# Patient Record
Sex: Male | Born: 1945 | Race: White | Hispanic: No | Marital: Married | Smoking: Never smoker
Health system: Southern US, Community
[De-identification: ages and names within clinical notes are randomized; demographics above are authoritative.]

## PROBLEM LIST (undated history)

## (undated) DIAGNOSIS — M67919 Unspecified disorder of synovium and tendon, unspecified shoulder: Secondary | ICD-10-CM

## (undated) DIAGNOSIS — I701 Atherosclerosis of renal artery: Secondary | ICD-10-CM

## (undated) DIAGNOSIS — E785 Hyperlipidemia, unspecified: Secondary | ICD-10-CM

## (undated) DIAGNOSIS — G4733 Obstructive sleep apnea (adult) (pediatric): Secondary | ICD-10-CM

## (undated) DIAGNOSIS — I219 Acute myocardial infarction, unspecified: Secondary | ICD-10-CM

## (undated) DIAGNOSIS — J8410 Pulmonary fibrosis, unspecified: Secondary | ICD-10-CM

## (undated) DIAGNOSIS — I2510 Atherosclerotic heart disease of native coronary artery without angina pectoris: Secondary | ICD-10-CM

## (undated) DIAGNOSIS — N200 Calculus of kidney: Secondary | ICD-10-CM

## (undated) DIAGNOSIS — I10 Essential (primary) hypertension: Secondary | ICD-10-CM

## (undated) DIAGNOSIS — C4490 Unspecified malignant neoplasm of skin, unspecified: Secondary | ICD-10-CM

## (undated) DIAGNOSIS — Z9289 Personal history of other medical treatment: Secondary | ICD-10-CM

## (undated) DIAGNOSIS — I872 Venous insufficiency (chronic) (peripheral): Secondary | ICD-10-CM

## (undated) DIAGNOSIS — Q891 Congenital malformations of adrenal gland: Secondary | ICD-10-CM

## (undated) DIAGNOSIS — I509 Heart failure, unspecified: Secondary | ICD-10-CM

## (undated) DIAGNOSIS — E119 Type 2 diabetes mellitus without complications: Secondary | ICD-10-CM

## (undated) DIAGNOSIS — K635 Polyp of colon: Secondary | ICD-10-CM

## (undated) DIAGNOSIS — N183 Chronic kidney disease, stage 3 unspecified: Secondary | ICD-10-CM

## (undated) DIAGNOSIS — M1990 Unspecified osteoarthritis, unspecified site: Secondary | ICD-10-CM

## (undated) DIAGNOSIS — N390 Urinary tract infection, site not specified: Secondary | ICD-10-CM

## (undated) DIAGNOSIS — K219 Gastro-esophageal reflux disease without esophagitis: Secondary | ICD-10-CM

## (undated) DIAGNOSIS — E079 Disorder of thyroid, unspecified: Secondary | ICD-10-CM

## (undated) HISTORY — DX: Venous insufficiency (chronic) (peripheral): I87.2

## (undated) HISTORY — DX: Calculus of kidney: N20.0

## (undated) HISTORY — DX: Chronic kidney disease, stage 3 unspecified: N18.30

## (undated) HISTORY — DX: Atherosclerotic heart disease of native coronary artery without angina pectoris: I25.10

## (undated) HISTORY — DX: Unspecified disorder of synovium and tendon, unspecified shoulder: M67.919

## (undated) HISTORY — DX: Chronic kidney disease, stage 3 (moderate): N18.3

## (undated) HISTORY — PX: CARPAL TUNNEL RELEASE: SHX101

## (undated) HISTORY — DX: Hyperlipidemia, unspecified: E78.5

## (undated) HISTORY — PX: LUNG BIOPSY: SHX232

## (undated) HISTORY — DX: Congenital malformations of adrenal gland: Q89.1

## (undated) HISTORY — DX: Obstructive sleep apnea (adult) (pediatric): G47.33

## (undated) HISTORY — DX: Personal history of other medical treatment: Z92.89

## (undated) HISTORY — DX: Essential (primary) hypertension: I10

## (undated) HISTORY — DX: Polyp of colon: K63.5

## (undated) HISTORY — PX: KNEE SURGERY: SHX244

## (undated) HISTORY — DX: Unspecified malignant neoplasm of skin, unspecified: C44.90

## (undated) HISTORY — PX: OTHER SURGICAL HISTORY: SHX169

## (undated) HISTORY — DX: Pulmonary fibrosis, unspecified: J84.10

## (undated) HISTORY — DX: Gastro-esophageal reflux disease without esophagitis: K21.9

## (undated) HISTORY — DX: Acute myocardial infarction, unspecified: I21.9

## (undated) HISTORY — DX: Type 2 diabetes mellitus without complications: E11.9

## (undated) HISTORY — DX: Urinary tract infection, site not specified: N39.0

## (undated) HISTORY — DX: Disorder of thyroid, unspecified: E07.9

## (undated) HISTORY — DX: Atherosclerosis of renal artery: I70.1

## (undated) HISTORY — DX: Heart failure, unspecified: I50.9

## (undated) HISTORY — PX: SKIN CANCER EXCISION: SHX779

## (undated) HISTORY — DX: Unspecified osteoarthritis, unspecified site: M19.90

---

## 2005-07-03 HISTORY — PX: CORONARY ANGIOPLASTY WITH STENT PLACEMENT: SHX49

## 2008-05-06 ENCOUNTER — Encounter: Payer: Self-pay | Admitting: Cardiovascular Disease

## 2009-03-23 ENCOUNTER — Encounter: Payer: Self-pay | Admitting: Cardiovascular Disease

## 2009-07-23 ENCOUNTER — Encounter: Payer: Self-pay | Admitting: Cardiovascular Disease

## 2009-08-12 ENCOUNTER — Encounter: Payer: Self-pay | Admitting: Cardiovascular Disease

## 2009-08-16 ENCOUNTER — Encounter: Payer: Self-pay | Admitting: Cardiovascular Disease

## 2009-08-30 ENCOUNTER — Encounter: Payer: Self-pay | Admitting: Cardiovascular Disease

## 2009-09-13 ENCOUNTER — Encounter: Payer: Self-pay | Admitting: Cardiovascular Disease

## 2010-01-04 ENCOUNTER — Encounter: Payer: Self-pay | Admitting: Cardiovascular Disease

## 2010-03-22 ENCOUNTER — Encounter: Payer: Self-pay | Admitting: *Deleted

## 2010-04-02 ENCOUNTER — Encounter: Payer: Self-pay | Admitting: *Deleted

## 2010-05-03 ENCOUNTER — Encounter: Payer: Self-pay | Admitting: *Deleted

## 2010-05-20 ENCOUNTER — Encounter: Payer: Self-pay | Admitting: Cardiovascular Disease

## 2010-06-02 ENCOUNTER — Encounter: Payer: Self-pay | Admitting: Cardiovascular Disease

## 2010-08-31 ENCOUNTER — Encounter: Payer: Self-pay | Admitting: Family Medicine

## 2010-08-31 ENCOUNTER — Emergency Department (HOSPITAL_COMMUNITY)
Admission: EM | Admit: 2010-08-31 | Discharge: 2010-08-31 | Disposition: A | Discharge status: Home / Self Care | Payer: Medicare PPO | Attending: Emergency Medicine | Admitting: Emergency Medicine

## 2010-08-31 ENCOUNTER — Encounter: Payer: Self-pay | Admitting: Cardiovascular Disease

## 2010-08-31 ENCOUNTER — Ambulatory Visit: Payer: Medicare Other | Admitting: Family Medicine

## 2010-08-31 DIAGNOSIS — I509 Heart failure, unspecified: Secondary | ICD-10-CM

## 2010-08-31 DIAGNOSIS — Z79899 Other long term (current) drug therapy: Secondary | ICD-10-CM | POA: Insufficient documentation

## 2010-08-31 DIAGNOSIS — R5381 Other malaise: Secondary | ICD-10-CM | POA: Insufficient documentation

## 2010-08-31 DIAGNOSIS — N183 Chronic kidney disease, stage 3 unspecified: Secondary | ICD-10-CM | POA: Insufficient documentation

## 2010-08-31 DIAGNOSIS — I10 Essential (primary) hypertension: Secondary | ICD-10-CM | POA: Insufficient documentation

## 2010-08-31 DIAGNOSIS — Z7982 Long term (current) use of aspirin: Secondary | ICD-10-CM | POA: Insufficient documentation

## 2010-08-31 DIAGNOSIS — N189 Chronic kidney disease, unspecified: Secondary | ICD-10-CM

## 2010-08-31 DIAGNOSIS — E1149 Type 2 diabetes mellitus with other diabetic neurological complication: Secondary | ICD-10-CM

## 2010-08-31 DIAGNOSIS — R51 Headache: Secondary | ICD-10-CM | POA: Insufficient documentation

## 2010-08-31 DIAGNOSIS — G4733 Obstructive sleep apnea (adult) (pediatric): Secondary | ICD-10-CM | POA: Insufficient documentation

## 2010-08-31 DIAGNOSIS — E119 Type 2 diabetes mellitus without complications: Secondary | ICD-10-CM | POA: Insufficient documentation

## 2010-08-31 DIAGNOSIS — I2510 Atherosclerotic heart disease of native coronary artery without angina pectoris: Secondary | ICD-10-CM | POA: Insufficient documentation

## 2010-08-31 DIAGNOSIS — I209 Angina pectoris, unspecified: Secondary | ICD-10-CM

## 2010-08-31 DIAGNOSIS — E8881 Metabolic syndrome: Secondary | ICD-10-CM | POA: Insufficient documentation

## 2010-08-31 DIAGNOSIS — R42 Dizziness and giddiness: Secondary | ICD-10-CM | POA: Insufficient documentation

## 2010-08-31 DIAGNOSIS — E1129 Type 2 diabetes mellitus with other diabetic kidney complication: Secondary | ICD-10-CM | POA: Insufficient documentation

## 2010-08-31 DIAGNOSIS — J8410 Pulmonary fibrosis, unspecified: Secondary | ICD-10-CM | POA: Insufficient documentation

## 2010-08-31 LAB — POCT CARDIAC MARKERS
CKMB, poc: <1 ng/mL — ABNORMAL LOW (ref 1.0–8.0)
Myoglobin, poc: 117 ng/mL (ref 12–200)
Troponin i, poc: <0.05 ng/mL (ref 0.00–0.09)

## 2010-08-31 LAB — POCT I-STAT, CHEM 8
BUN: 19 mg/dL (ref 6–23)
Calcium, Ion: 1.18 mmol/L (ref 1.12–1.32)
Chloride: 106 mEq/L (ref 96–112)
Creatinine, Ser: 1.3 mg/dL (ref 0.4–1.5)
Glucose, Bld: 204 mg/dL — ABNORMAL HIGH (ref 70–99)
HCT: 42 % (ref 39.0–52.0)
Hemoglobin: 14.3 g/dL (ref 13.0–17.0)
Potassium: 3.9 mEq/L (ref 3.5–5.1)
Sodium: 141 mEq/L (ref 135–145)
TCO2: 25 mmol/L (ref 0–100)

## 2010-09-01 ENCOUNTER — Ambulatory Visit (INDEPENDENT_AMBULATORY_CARE_PROVIDER_SITE_OTHER): Payer: Medicare PPO | Admitting: Cardiovascular Disease

## 2010-09-01 ENCOUNTER — Encounter: Payer: Self-pay | Admitting: Family Medicine

## 2010-09-01 ENCOUNTER — Telehealth: Payer: Self-pay | Admitting: Family Medicine

## 2010-09-01 ENCOUNTER — Encounter: Payer: Self-pay | Admitting: Cardiovascular Disease

## 2010-09-01 DIAGNOSIS — I2510 Atherosclerotic heart disease of native coronary artery without angina pectoris: Secondary | ICD-10-CM

## 2010-09-01 DIAGNOSIS — I10 Essential (primary) hypertension: Secondary | ICD-10-CM

## 2010-09-01 DIAGNOSIS — E119 Type 2 diabetes mellitus without complications: Secondary | ICD-10-CM

## 2010-09-01 DIAGNOSIS — R079 Chest pain, unspecified: Secondary | ICD-10-CM

## 2010-09-01 DIAGNOSIS — R0602 Shortness of breath: Secondary | ICD-10-CM

## 2010-09-08 NOTE — Progress Notes (Signed)
  Phone Note Outgoing Call   Call placed by: Irwin Brakeman MD,  September 01, 2010 11:02 AM Call placed to: Patient Reason for Call: Confirm/change Appt, Discuss lab or test results, Get patient information Summary of Call: I called and spoke with patient to check on his condition.  He went to [place] yesterday and sent home when cardiac enzymes were negative and told to see his PCP about his BP.  He said his BP this morning was 221/110.   I told him that I would try to get him an appointment with a cardiologist asap.  I spoke with scheduling with [facility] and they will see him today at 2 pm.  I will fax patient's records over there today and I notified patient of time and location of appointment.  The patient verbalized clear understanding.   I told patient to go to ER with a development of CP, worsening SOB; or any new bad changes.  The patient verbalized clear understanding.   Initial call taken by: Irwin Brakeman MD,  September 01, 2010 11:05 AM

## 2010-09-08 NOTE — Miscellaneous (Signed)
Summary: Update Meds  Clinical Lists Changes  Medications: Added new medication of PLAVIX 75 MG TABS (CLOPIDOGREL BISULFATE) take 1 by mouth daily Added new medication of CARVEDILOL 25 MG TABS (CARVEDILOL) take 1 by mouth two times a day Added new medication of GABAPENTIN 800 MG TABS (GABAPENTIN) take 1 in AM and 1.5 at HS Added new medication of FUROSEMIDE 80 MG TABS (FUROSEMIDE) take 1 every other day Added new medication of TRAMADOL HCL 50 MG TABS (TRAMADOL HCL) take 1 by mouth daily Added new medication of ASPIRIN 325 MG TABS (ASPIRIN) take 1 by mouth daily Added new medication of ISOSORBIDE MONONITRATE CR 60 MG XR24H-TAB (ISOSORBIDE MONONITRATE) take 1 by mouth daily Added new medication of FAMOTIDINE 20 MG TABS (FAMOTIDINE) take 1 by mouth daily Added new medication of AMITRIPTYLINE HCL 25 MG TABS (AMITRIPTYLINE HCL) Take 1 to 3 tabs at bedtime as directed Added new medication of NITROSTAT 0.4 MG SUBL (NITROGLYCERIN) take as directed for chest pain Added new medication of LIPITOR 80 MG TABS (ATORVASTATIN CALCIUM) take 1 by mouth at HS daily

## 2010-09-08 NOTE — Assessment & Plan Note (Signed)
Summary: Elevated BP/AMD   Visit Type:  Initial Consult Primary Provider:  Dr.Duncan  CC:  c/o high BP and HA. Denies chest pain.  History of Present Illness: [name] is a very pleasant 65 year old gentleman with a history of coronary artery disease, PCI to the mid RCA and mid left circumflex in October 2007, interstitial fibrosis, with wedge resection, hypertension, hyperlipidemia, remote tobacco abuse, obesity also with substernal chest pain who presents to establish care.  He reports that for the past week, his blood pressure has been very elevated. It had been normal on his previous medication regiment and he is uncertain why his numbers are elevated now. He denies any worsening lower extremity edema, shortness of breath is approximately the same. No new stressors. He has not run out of any of his medicines.  He did go to urgent care and was sent to the emergency room. Urgentcare gave him clonidine 0.1 mg x1 and this improved his pressure though he is now without any additional medication.  Cardiac catheterization in July 2008 shows 10% proximal LAD disease, otherwise no significant stenoses. Stent to the mid RCA placed August 2007 was patent, stent with DES to the mid left circumflex in October 2007 also patent.  Open lung biopsy was performed November 2009 for bilateral pulmonary infiltrates.  Stress test July 2011 was dobutamine stress that showed no significant ischemia, inadequate heart rate was achieved.  right and left heart catheterization January 2011 showed 50% mid LAD disease, 60% at the ostium of the diagonal #2, 30% proximal left circumflex disease, patent stent in the left circumflex, patent stent of the mid RCA, wedge pressure of 16, PA pressure mean 22, right ventricular pressure 41/8 .  echocardiogram September 2010 shows normal systolic function, diastolic dysfunction, normal RV function unable to evaluate right ventricular systolic pressures  EKG shows normal sinus  rhythm with rate 75 beats per minute with poor R-wave progression through the precordial leads, left axis deviation    Current Medications (verified): 1)  Novolog 100 Unit/ml Soln (Insulin Aspart) .... Take 25 Units Tidac or As Directed By Physician 2)  Lisinopril 20 Mg Tabs (Lisinopril) .... Take 1 By Mouth Daily 3)  Plavix 75 Mg Tabs (Clopidogrel Bisulfate) .... Take 1 By Mouth Daily 4)  Carvedilol 25 Mg Tabs (Carvedilol) .... Take 1 By Mouth Two Times A Day 5)  Gabapentin 800 Mg Tabs (Gabapentin) .... Take 1 in Am and 1.5 At [facility] 6)  Furosemide 80 Mg Tabs (Furosemide) .... Take 1 Every Other Day 7)  Tramadol Hcl 50 Mg Tabs (Tramadol Hcl) .... Take 1 By Mouth Daily 8)  Aspirin 325 Mg Tabs (Aspirin) .... Take 1 By Mouth Daily 9)  Isosorbide Mononitrate Cr 60 Mg Xr24h-Tab (Isosorbide Mononitrate) .... Take 1 By Mouth Daily 10)  Famotidine 20 Mg Tabs (Famotidine) .... Take 1 By Mouth Daily 11)  Amitriptyline Hcl 25 Mg Tabs (Amitriptyline Hcl) .... Take 1 To 3 Tabs At Bedtime As Directed 12)  Nitrostat 0.4 Mg Subl (Nitroglycerin) .... Take As Directed For Chest Pain 13)  Lipitor 80 Mg Tabs (Atorvastatin Calcium) .... Take 1 By Mouth At [facility] Daily  Allergies (verified): No Known Drug Allergies  Past History:  Past Medical History: Last updated: 08/31/2010 CAD s/p MI x 3  now with 2 stents DM type 2, insulin requiring Diabetic Neuropathy Sleep Apnea - Obstructive HTN CHF Kidney Stones Dyslipidemia Metabolic Syndrome  Past Surgical History: Last updated: 08/31/2010 Adrenal Adenoma Removal Cardiac Stents x 2  in 2007  Knee Surgery Lung Biopsy  Family History: Last updated: 08/31/2010 Heart Disease - Strong  Social History: Last updated: 08/31/2010 Married, No ETOH, No Tobacco (current), No recreational drugs.  Review of Systems       The patient complains of dyspnea on exertion and peripheral edema.  The patient denies fever, weight loss, weight gain, vision loss, decreased  hearing, hoarseness, chest pain, syncope, prolonged cough, abdominal pain, incontinence, muscle weakness, depression, and enlarged lymph nodes.    Vital Signs:  Patient profile:   65 year old male Height:      65 inches Weight:      284.25 pounds BMI:     47.47 Pulse rate:   75 / minute BP sitting:   168 / 82  (left arm) Cuff size:   large  Vitals Entered By: Rodman Comp CMA (September 01, 2010 2:13 PM)  Physical Exam  General:  obese gentleman in no apparent distress with nasal cannula oxygen Head:  normocephalic and atraumatic Neck:  Neck supple, no JVD. No masses, thyromegaly or abnormal cervical nodes. Lungs:  Clear bilaterally to auscultation and percussion. Heart:  Non-displaced PMI, chest non-tender; regular rate and rhythm, S1, S2 without murmurs, rubs or gallops. Carotid upstroke normal, no bruit. Pedals normal pulses. trace to 1+ edema bilaterally lower extremities , no varicosities. Abdomen:  Bowel sounds positive; abdomen soft and non-tender without masses Msk:  Back normal, normal gait. Muscle strength and tone normal. Pulses:  pulses normal in all 4 extremities Extremities:  No clubbing or cyanosis. Neurologic:  Alert and oriented x 3. Skin:  Intact without lesions or rashes. Psych:  Normal affect.   Impression & Recommendations:  Problem # 1:  HYPERTENSION, UNCONTROLLED (ICD-401.9) blood pressure continues to be elevated. We will continue him on clonidine 0.1 mg b.i.d. and titrate this upwards as needed over the next several weeks for goal systolic pressure less than 140. We'll continue his other current medications as he is tolerating them well.  His updated medication list for this problem includes:    Lisinopril 20 Mg Tabs (Lisinopril) .Marland Kitchen... Take 1 by mouth daily    Carvedilol 25 Mg Tabs (Carvedilol) .Marland Kitchen... Take 1 by mouth two times a day    Furosemide 80 Mg Tabs (Furosemide) .Marland Kitchen... Take 1 every other day    Aspir-low 81 Mg Tbec (Aspirin) .Marland Kitchen... 1 tablet two times  a day    Clonidine Hcl 0.1 Mg Tabs (Clonidine hcl) .Marland Kitchen... Take one tablet by mouth twice a day  Problem # 2:  RENAL INSUFFICIENCY, CHRONIC (ICD-585.9) Recent creatinine 1.3. He reports having a low GFR in the past  Problem # 3:  CAD, NATIVE VESSEL (ICD-414.01) history of stenting to his RCA and left circumflex in 2007. No further intervention needed since that time with recent cardiac catheterization last year showing patent stents. We have suggested he stay on his current medication regimen.  His updated medication list for this problem includes:    Lisinopril 20 Mg Tabs (Lisinopril) .Marland Kitchen... Take 1 by mouth daily    Plavix 75 Mg Tabs (Clopidogrel bisulfate) .Marland Kitchen... Take 1 by mouth daily    Carvedilol 25 Mg Tabs (Carvedilol) .Marland Kitchen... Take 1 by mouth two times a day    Aspir-low 81 Mg Tbec (Aspirin) .Marland Kitchen... 1 tablet two times a day    Isosorbide Mononitrate Cr 60 Mg Xr24h-tab (Isosorbide mononitrate) .Marland Kitchen... Take 1 by mouth daily    Nitrostat 0.4 Mg Subl (Nitroglycerin) .Marland Kitchen... Take as directed for chest pain  Problem #  4:  CONGESTIVE HEART FAILURE (ICD-428.00) No signs of significant CHF. His edema is likely secondary to venous insufficiency. His right heart catheterization last year showed relatively benign right heart pressures. He takes Lasix every other day and p.r.n. for worsening edema. He likely has mild pulmonary hypertension given his underlying lung disease.  His updated medication list for this problem includes:    Lisinopril 20 Mg Tabs (Lisinopril) .Marland Kitchen... Take 1 by mouth daily    Plavix 75 Mg Tabs (Clopidogrel bisulfate) .Marland Kitchen... Take 1 by mouth daily    Carvedilol 25 Mg Tabs (Carvedilol) .Marland Kitchen... Take 1 by mouth two times a day    Furosemide 80 Mg Tabs (Furosemide) .Marland Kitchen... Take 1 every other day    Aspir-low 81 Mg Tbec (Aspirin) .Marland Kitchen... 1 tablet two times a day    Isosorbide Mononitrate Cr 60 Mg Xr24h-tab (Isosorbide mononitrate) .Marland Kitchen... Take 1 by mouth daily    Nitrostat 0.4 Mg Subl  (Nitroglycerin) .Marland Kitchen... 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 Given his relatively benign cardiac catheterization last year, no further workup has been ordered at this time.  His updated medication list for this problem includes:    Lisinopril 20 Mg Tabs (Lisinopril) .Marland Kitchen... Take 1 by mouth daily    Plavix 75 Mg Tabs (Clopidogrel bisulfate) .Marland Kitchen... Take 1 by mouth daily    Carvedilol 25 Mg Tabs (Carvedilol) .Marland Kitchen... Take 1 by mouth two times a day    Aspir-low 81 Mg Tbec (Aspirin) .Marland Kitchen... 1 tablet two times a day    Isosorbide Mononitrate Cr 60 Mg Xr24h-tab (Isosorbide mononitrate) .Marland Kitchen... Take 1 by mouth daily    Nitrostat 0.4 Mg Subl (Nitroglycerin) .Marland Kitchen... Take as directed for chest pain  Other Orders: EKG w/ Interpretation (93000)  Patient Instructions: 1)  Your physician recommends that you schedule a follow-up appointment in: 2 months, appt made on May 1,2012 at 10:15 2)  Your physician has recommended you make the following change in your medication: STOP taking Aspirin 325mg  and START taking Aspirin 81mg  1 tablet two times a day. START taking Clonidine 0.1 1 tablet two times a day.  Prescriptions: CLONIDINE HCL 0.1 MG TABS (CLONIDINE HCL) Take one tablet by mouth twice a day  #60 x 6   Entered by:   Rodman Comp CMA   Authorized by:   Esmond Plants MD   Signed by:   Rodman Comp CMA on 09/01/2010   Method used:   Electronically to        [facility].* (retail)       [address]       [place], [place]  [address]       Ph: [phone]       Fax: [phone]   RxID:   [id number]   Appended Document: Elevated BP/AMD Correction: Most recent cardiac catheterization did show 50% mid LAD disease. He did have a inconclusive stress test in 2011 in July if he has worsening chest pain, we could repeat the cardiac catheterization or order a lexiscan  Myoview  Appended Document: Elevated BP/AMD he does have underlying diabetes. Notes indicate it is poorly controlled with hemoglobin A1c of 8.8. He is scheduled to followup with Dr. [name] in several weeks' time.

## 2010-09-08 NOTE — Assessment & Plan Note (Signed)
Summary: BP IS HIGH   Vital Signs:  Patient Profile:   65 Years Old Male CC:      Elevated Blood Pressure Weight:      284 pounds O2 Sat:      97 % O2 treatment:    Oxygen 2L/min Temp:     98.6 degrees F oral Pulse rate:   68 / minute Pulse rhythm:   regular Resp:     16 per minute BP sitting:   203 / 88  (right arm)             Is Patient Diabetic? Yes       Current Allergies (reviewed today): No known allergies History of Present Illness History from: patient/Spouse Chief Complaint: Elevated Blood Pressure History of Present Illness: The patient presented today because he is between physicians at the time. Unfortunately his current [facility] plan did not support the PCP that he had been seeing.  He is reporting that for the past 2 weeks he has noticed a large spike in his home blood pressure readings.  He noticed bp readings with high systolic values. He is having SBP readings 170-220s consistently.  He is taking all of his blood pressure medications, He is taking his insulin regularly and taking his  BP readings at home and they have not been able to get the BP down in the last 2 weeks.  He is having chronic anginal chest pains but he says that they have improved greatly since his cardiologist put him on isosorbide regularly.  In addition, he doesn't have a local cardiologist.  He has been seen at [facility] and reports that he needs a local [place] cardiologist.  In addition, he is reporting increasing fatigue over the last several weeks.  He denies increasing weight or fluid edema.  He has CAD, s/p 2 stents and 3 prior MIs.  He has reported history of CHF.  He has Type 2 DM, insulin requiring and O2 requiring COPD.  He had decided not to go to the ER because he was hoping that his pressure would eventually come back down but given his wife's concern they called [facility] and they were referred out here to be seen.   I immediately explained clearly to them that he absolutely needed to go to the  ER to stabilize his BP and assess his delicate cardiac status.  He says that he has used his nighttime CPAP continuously.  They verbalized understanding. They absolutely refused ambulance transport or EMS saying they would go to [place] by private vehicle.    REVIEW OF SYSTEMS Constitutional Symptoms       Complains of fatigue.     Denies fever, chills, night sweats, weight loss, and weight gain.  Eyes       Complains of glasses.      Denies change in vision, eye pain, eye discharge, contact lenses, and eye surgery. Ear/Nose/Throat/Mouth       Denies hearing loss/aids, change in hearing, ear pain, ear discharge, dizziness, frequent runny nose, frequent nose bleeds, sinus problems, sore throat, hoarseness, and tooth pain or bleeding.  Respiratory       Complains of shortness of breath.      Denies dry cough, productive cough, wheezing, asthma, bronchitis, and emphysema/COPD.  Cardiovascular       Complains of chest pain and tires easily with exhertion.      Denies murmurs.    Gastrointestinal       Denies stomach pain, nausea/vomiting, diarrhea, constipation,  blood in bowel movements, and indigestion. Genitourniary       Complains of kidney stones.      Denies painful urination and loss of urinary control. Neurological       Complains of numbness and tingling.      Denies paralysis, seizures, and fainting/blackouts. Musculoskeletal       Complains of swelling.      Denies muscle pain, joint pain, joint stiffness, decreased range of motion, redness, muscle weakness, and gout.  Skin       Denies bruising, unusual mles/lumps or sores, and hair/skin or nail changes.  Psych       Denies mood changes, temper/anger issues, anxiety/stress, speech problems, depression, and sleep problems.  Past History:  Family History: Last updated: 08/31/2010 Heart Disease - Strong  Social History: Last updated: 08/31/2010 Married, No ETOH, No Tobacco (current), No recreational drugs.  Past Medical  History: CAD s/p MI x 3  now with 2 stents DM type 2, insulin requiring Diabetic Neuropathy Sleep Apnea - Obstructive HTN CHF Kidney Stones Dyslipidemia Metabolic Syndrome  Past Surgical History: Adrenal Adenoma Removal Cardiac Stents x 2  in 2007 Knee Surgery Lung Biopsy  Family History: Heart Disease - Strong  Social History: Married, No ETOH, No Tobacco (current), No recreational drugs. Physical Exam General appearance: well developed, well nourished, no acute distress, but chronically ill appearing; carrying O2 tank with nasal cannula Head: normocephalic, atraumatic Eyes: conjunctivae and lids normal Pupils: equal, round, reactive to light Ears: normal, no lesions or deformities Nasal: nasal cannula present; mucosa pink, and dry Oral/Pharynx: pharyngeal erythema without exudate, uvula midline without deviation Neck: neck large, thick,  trachea midline, no masses Thyroid: no nodules, masses, tenderness, or enlargement Chest/Lungs: no rales, wheezes, or rhonchi, bilateral, breath sounds shallow Heart: distant HS, normal s1, s2 sounds;  Abdomen: soft, non-tender without obvious organomegaly Extremities: 2+ pitting edema bilateral lower extremities feet to proximal thigh Neurological: grossly intact and non-focal Skin: no obvious rashes or lesions MSE: oriented to time, place, and person Assessment New Problems: METABOLIC SYNDROME X ([id number]) SLEEP APNEA, OBSTRUCTIVE (ICD-327.23) HYPERTENSION, UNCONTROLLED (ICD-401.9) DIABETES MELLITUS, TYPE II, CONTROLLED, W/NEURO COMPS (ICD-250.60) RENAL INSUFFICIENCY, CHRONIC (ICD-585.9) HYPERTENSION (ICD-401.1) 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  The patient verbalized clear understanding.  The patient was given instructions to return  if symptoms don't improve, worsen or new changes develop.  If it is not during clinic hours and the patient cannot get back to this clinic then the patient was told to seek medical care at an available urgent care or emergency department.  The patient verbalized understanding.   Demonstrates willingness to comply.  Plan New Medications/Changes: LISINOPRIL 20 MG TABS (LISINOPRIL) take 1 by mouth daily  #30 x 1, 08/31/2010, Clanford Johnson MD NOVOLOG 100 UNIT/ML SOLN (INSULIN ASPART) take 25 units TIDAC or as directed by physician  #2 x 2, 08/31/2010, Irwin Brakeman MD  Planning Comments:   Go to ER for evaluation and treatment. The patient verbalized clear understanding.   The patient and wife declined to send him to the ER by EMS transport. They elected to go by private vehicle.  The risks were explained to them and they verbalized understanding.      The patient and/or caregiver has been counseled thoroughly with regard to medications prescribed including dosage, schedule, interactions, rationale for use, and possible side effects  and they verbalize understanding.  Diagnoses and expected course of recovery discussed and will return if not improved as expected or if the condition worsens. Patient and/or caregiver verbalized understanding.  Prescriptions: LISINOPRIL 20 MG TABS (LISINOPRIL) take 1 by mouth daily  #30 x 1   Entered and Authorized by:   Irwin Brakeman MD   Signed by:   Irwin Brakeman MD on 08/31/2010   Method used:   Electronically to        [facility].* (retail)       [address]       [place], [place]  [address]       Ph: [phone]       Fax: [phone]   RxID:   [id number] NOVOLOG 100 UNIT/ML SOLN (INSULIN ASPART) take 25 units TIDAC or as directed by physician  #2 x 2   Entered and Authorized by:   Irwin Brakeman MD   Signed by:   Irwin Brakeman MD on 08/31/2010   Method used:   Electronically to        [facility].* (retail)       [address]       [place], [place]  [address]       Ph: [phone]       Fax: [phone]   RxID:   [id number]   Patient Instructions: 1)  Go directly to the ER for evaluation and treatment.   2)  I have called over and given them some of your information to tell them that you are coming.    Medication Administration  Medication # 1:    Medication: Clonidine 0.1mg  tab    Dose: 1 tablet    Route: po    Patient tolerated medication without complications    The patient was sent directly to the ER.  I called the ER at [facility] where patient said he was going and updated the charge RN about the patient's arrival.  He was stable when leaving our clinic but risks explained to patient and wife who verbalized understanding and decided to proceed.  Gerlene Fee, MD, CDE, [facility]

## 2010-09-14 ENCOUNTER — Encounter: Payer: Self-pay | Admitting: Cardiovascular Disease

## 2010-09-15 ENCOUNTER — Ambulatory Visit (INDEPENDENT_AMBULATORY_CARE_PROVIDER_SITE_OTHER): Payer: Medicare PPO | Admitting: Family Medicine

## 2010-09-15 ENCOUNTER — Other Ambulatory Visit: Payer: Self-pay | Admitting: Family Medicine

## 2010-09-15 ENCOUNTER — Encounter: Payer: Self-pay | Admitting: Family Medicine

## 2010-09-15 DIAGNOSIS — E1149 Type 2 diabetes mellitus with other diabetic neurological complication: Secondary | ICD-10-CM

## 2010-09-15 DIAGNOSIS — I2510 Atherosclerotic heart disease of native coronary artery without angina pectoris: Secondary | ICD-10-CM

## 2010-09-15 DIAGNOSIS — I10 Essential (primary) hypertension: Secondary | ICD-10-CM

## 2010-09-15 DIAGNOSIS — J8410 Pulmonary fibrosis, unspecified: Secondary | ICD-10-CM | POA: Insufficient documentation

## 2010-09-15 LAB — HM DIABETES FOOT EXAM

## 2010-09-19 LAB — HEMOGLOBIN A1C: Hgb A1c MFr Bld: 10.2 % — ABNORMAL HIGH (ref 4.6–6.5)

## 2010-09-20 NOTE — Assessment & Plan Note (Signed)
Summary: NEW MEDICARE PT TO EST/CHECK BP/CLE  [facility]   Vital Signs:  Patient profile:   65 year old male Height:      65 inches Weight:      288.25 pounds BMI:     48.14 Temp:     98.7 degrees F oral Pulse rate:   84 / minute Pulse rhythm:   regular BP sitting:   166 / 90  (left arm) Cuff size:   large  Vitals Entered By: Christena Deem CMA Deborra Medina) (September 15, 2010 2:01 PM)  CC: New Patient.  Check BP (Medicare)   History of Present Illness: H/o pulmonary fibrosis, needs follow up with pulm.  Prev seen at [facility].  On O2.  S/p resection.  Known OSA.  SOB at baseline now, worse with exertion.  Worse in the heat of summer.  Has had symptoms since  ~2008.  Diabetes:  Using medications without difficulties:yes Hypoglycemic episodes:rare Hyperglycemic episodes:occ Feet problems:see exam, controlled with amitriptyline Blood Sugars averaging: AM 100-120.  before lunch sugar is  ~170, similar with dinner.  Due for labs.   CAD per cards.  Last OV note reviewed.  CP improved on nitrates.   Hypertension:      Using medication without problems or lightheadedness: yes Chest pain with exertion:yes, improved on nitrates Edema:controlled Short of breath:at baseline Average home BPs: still elevated on clonidine.   Allergies: No Known Drug Allergies  Past History:  Past Medical History: CAD s/p MI x 3  now with 2 stents DM type 2, insulin requiring Diabetic Neuropathy Sleep Apnea - Obstructive HTN CHF Kidney Stones Dyslipidemia Metabolic Syndrome Arthritis GERD Heart disease High cholesterol Kidney Disease Polyps in the colon Thyroid problem UTI's Pulmonary fibrosis s/p wedge resection, chronic O2 use.   Cardiac- [place]  Past Surgical History: Adrenal Adenoma Removal, right,  ~1990 Cardiac Stents x 2  in 2007 Knee Surgery, right Lung Biopsy B carpal tunnel surgery  Family History: Reviewed history from 08/31/2010 and no changes required. Heart Disease -  Strong Family History of Arthritis, grandparents Family History Breast cancer 1st degree relative <50, other blood relative Family History Diabetes 1st degree relative, other blood relative Family History High cholesterol, grandparents Family History Hypertension, grandparents Family History of Stroke F 1st degree relative <60 grandparents Family History of Sudden Death, parents, other blood relative F dead of MI at 72 M dead of MI at 66 brother dead at 66 of MI  Social History: Reviewed history from 08/31/2010 and no changes required. Married 1970 No ETOH Never smoked No recreational drugs enjoys fishing.  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  Otherwise negative.    Physical Exam  General:  no apparent distress on O2 via [place] normocephalic atraumatic mucous membranes moist neck supple regular rate and rhythm clear to auscultation bilaterally w/o wheeze, but limited by habitus, no increase in wob abdomen soft, obese, normal bs minimal edema in legs  Diabetes Management Exam:    Foot Exam (with socks and/or shoes not present):       Sensory-Pinprick/Light touch:          Left medial foot (L-4): normal          Left dorsal foot (L-5): normal          Left lateral foot (S-1): normal          Right medial foot (L-4): normal  Right dorsal foot (L-5): normal          Right lateral foot (S-1): normal       Sensory-Monofilament:          Left foot: normal          Right foot: normal       Inspection:          Left foot: normal          Right foot: normal       Nails:          Left foot: normal          Right foot: normal   Impression & Recommendations:  Problem # 1:  PULMONARY FIBROSIS (ICD-515) refer to pulm.  Orders: Pulmonary Referral (Pulmonary)  Problem # 2:  DIABETES MELLITUS, TYPE II, CONTROLLED, W/NEURO COMPS (ICD-250.60) >45 min spent with patient, at least half of which was spent  on counseling [id number].  No decrease in sensation today, but he does have neuropathy with pain controlled on TCA.  check A1c and he'll notify me with his sugar readings over the next week to try to adjust his meds.  No change in the meantime.  He agrees with plan.  d/w patient re:Dm2 diet.  His updated medication list for this problem includes:    Novolog 100 Unit/ml Soln (Insulin aspart) .Marland Kitchen... Take 48 units tidac or as directed by physician    Lisinopril 20 Mg Tabs (Lisinopril) .Marland Kitchen... Take 1 by mouth daily    Aspir-low 81 Mg Tbec (Aspirin) .Marland Kitchen... 1 tablet two times a day    Lantus 100 Unit/ml Soln (Insulin glargine) .Marland Kitchen... 90 units every am  Orders: Prescription Created Electronically [id number]) TLB-A1C / Hgb A1C (Glycohemoglobin) (83036-A1C)  Problem # 3:  HYPERTENSION, UNCONTROLLED (ICD-401.9) Inc to 0.2mg  of clonidine two times a day and he knows to take it as scheduled to avoid rebound htn.  His updated medication list for this problem includes:    Lisinopril 20 Mg Tabs (Lisinopril) .Marland Kitchen... Take 1 by mouth daily    Carvedilol 25 Mg Tabs (Carvedilol) .Marland Kitchen... Take 1 by mouth two times a day    Furosemide 80 Mg Tabs (Furosemide) .Marland Kitchen... Take 1 every other day    Clonidine Hcl 0.2 Mg Tabs (Clonidine hcl) .Marland Kitchen... 1 by mouth two times a day  Problem # 4:  CAD, NATIVE VESSEL (ICD-414.01) CP improved,  will work on DM/HTN in meantime.  App cards help.  His updated medication list for this problem includes:    Lisinopril 20 Mg Tabs (Lisinopril) .Marland Kitchen... Take 1 by mouth daily    Plavix 75 Mg Tabs (Clopidogrel bisulfate) .Marland Kitchen... Take 1 by mouth daily    Carvedilol 25 Mg Tabs (Carvedilol) .Marland Kitchen... Take 1 by mouth two times a day    Furosemide 80 Mg Tabs (Furosemide) .Marland Kitchen... Take 1 every other day    Aspir-low 81 Mg Tbec (Aspirin) .Marland Kitchen... 1 tablet two times a day    Isosorbide Mononitrate Cr 60 Mg Xr24h-tab (Isosorbide mononitrate) .Marland Kitchen... Take 1 by mouth daily    Nitrostat 0.4 Mg Subl (Nitroglycerin) .Marland Kitchen... Take as directed  for chest pain    Clonidine Hcl 0.2 Mg Tabs (Clonidine hcl) .Marland Kitchen... 1 by mouth two times a day  Complete Medication List: 1)  Novolog 100 Unit/ml Soln (Insulin aspart) .... Take 48 units tidac or as directed by physician 2)  Lisinopril 20 Mg Tabs (Lisinopril) .... Take 1 by mouth daily 3)  Plavix 75 Mg Tabs (Clopidogrel bisulfate) .Marland KitchenMarland KitchenMarland Kitchen  Take 1 by mouth daily 4)  Carvedilol 25 Mg Tabs (Carvedilol) .... Take 1 by mouth two times a day 5)  Gabapentin 800 Mg Tabs (Gabapentin) .... Take 1 in am and 1.5 at hs 6)  Furosemide 80 Mg Tabs (Furosemide) .... Take 1 every other day 7)  Tramadol Hcl 50 Mg Tabs (Tramadol hcl) .... Take 1 by mouth daily 8)  Aspir-low 81 Mg Tbec (Aspirin) .Marland Kitchen.. 1 tablet two times a day 9)  Isosorbide Mononitrate Cr 60 Mg Xr24h-tab (Isosorbide mononitrate) .... Take 1 by mouth daily 10)  Famotidine 20 Mg Tabs (Famotidine) .... Take 1 by mouth daily 11)  Amitriptyline Hcl 50 Mg Tabs (Amitriptyline hcl) .Marland Kitchen.. 1 by mouth at bedtime 12)  Nitrostat 0.4 Mg Subl (Nitroglycerin) .... Take as directed for chest pain 13)  Lipitor 80 Mg Tabs (Atorvastatin calcium) .... Take 1 by mouth at hs daily 14)  Clonidine Hcl 0.2 Mg Tabs (Clonidine hcl) .Marland Kitchen.. 1 by mouth two times a day 15)  Lantus 100 Unit/ml Soln (Insulin glargine) .... 90 units every am  Patient Instructions: 1)  See Rosaria Ferries about your referral before your leave today.   2)  You can get your results through our phone system.  Follow the instructions on the blue card. 3)  Inc the clonidine to 0.2mg  two times a day and check your pressure at home.  Let me know about the readings next week. 4)  Check your sugar three times a day (before each meal) and let me know about the readings in 1 week.  I'll send information about your insulin at that point.  Take care.  5)  50min OV in 3 months with fasting cmet/lipid/A1c before OV. 250.00  Prescriptions: AMITRIPTYLINE HCL 50 MG TABS (AMITRIPTYLINE HCL) 1 by mouth at bedtime  #90 x 3   Entered  and Authorized by:   Elsie Stain MD   Signed by:   Elsie Stain MD on 09/15/2010   Method used:   Electronically to        [facility] (retail)       [address]       [place], [place]  [address]       Ph: [phone]       Fax: [phone]   RxID:   [id number] CLONIDINE HCL 0.2 MG TABS (CLONIDINE HCL) 1 by mouth two times a day  #180 x 3   Entered and Authorized by:   Elsie Stain MD   Signed by:   Elsie Stain MD on 09/15/2010   Method used:   Electronically to        [facility] (retail)       [address]       [place], [place]  [address]       Ph: [phone]       Fax: [phone]   RxID:   [id number] LANTUS 100 UNIT/ML SOLN (INSULIN GLARGINE) 90 units every AM  #10 vials x 3   Entered and Authorized by:   Elsie Stain MD   Signed by:   Elsie Stain MD on 09/15/2010   Method used:   Electronically to        [facility] (retail)       [place], [place]  [id number]       Ph: [phone]       Fax: [phone]   RxID:   [id number]    Orders Added: 1)  New Patient Level III [id number] 2)  Pulmonary Referral [Pulmonary] 3)  Prescription Created Electronically [id number] 4)  TLB-A1C / Hgb A1C (Glycohemoglobin) [83036-A1C]

## 2010-09-20 NOTE — Op Note (Signed)
Summary: [facility]   Imported By: Mingo Amber Bridgeforth 09/15/2010 11:38:17  _____________________________________________________________________  External Attachment:    Type:   Image     Comment:   External Document

## 2010-09-20 NOTE — Letter (Signed)
Summary: Medical Record Release  Medical Record Release   Imported By: Zenovia Jarred 09/14/2010 08:02:50  _____________________________________________________________________  External Attachment:    Type:   Image     Comment:   External Document

## 2010-09-20 NOTE — Cardiovascular Report (Signed)
Summary: [facility]   Imported By: Sallee Provencal 09/15/2010 11:38:58  _____________________________________________________________________  External Attachment:    Type:   Image     Comment:   External Document

## 2010-09-20 NOTE — Letter (Signed)
Summary: [facility] Visit Note   [facility] Visit Note   Imported By: Sallee Provencal 09/13/2010 11:49:55  _____________________________________________________________________  External Attachment:    Type:   Image     Comment:   External Document

## 2010-09-20 NOTE — Op Note (Signed)
Summary: [facility]   Imported By: Sallee Provencal 09/13/2010 12:33:28  _____________________________________________________________________  External Attachment:    Type:   Image     Comment:   External Document

## 2010-09-20 NOTE — Progress Notes (Signed)
Summary: Med List/At Home Vitals Log   Med List/At Home Vitals Log   Imported By: Sallee Provencal 09/15/2010 12:02:11  _____________________________________________________________________  External Attachment:    Type:   Image     Comment:   External Document

## 2010-09-27 ENCOUNTER — Other Ambulatory Visit: Payer: Self-pay | Admitting: Family Medicine

## 2010-09-27 MED ORDER — INSULIN GLARGINE 100 UNIT/ML ~~LOC~~ SOLN: 95.0000 [IU] | SUBCUTANEOUS | Status: DC

## 2010-09-27 MED ORDER — INSULIN ASPART 100 UNIT/ML ~~LOC~~ SOLN: 50.0000 [IU] | Freq: Three times a day (TID) | SUBCUTANEOUS | Status: DC

## 2010-09-30 ENCOUNTER — Telehealth: Payer: Self-pay | Admitting: *Deleted

## 2010-09-30 ENCOUNTER — Encounter: Payer: Self-pay | Admitting: *Deleted

## 2010-09-30 NOTE — Telephone Encounter (Signed)
Message copied by Christena Deem on Fri Sep 30, 2010  6:17 PM ------      Message from: Renford Dills      Created: Wed Sep 28, 2010  1:02 PM       Please notify Pt.  I sent a not to Dr. Rockey Situ.  We want him to check his BP for another week and then send me the numbers.  I wouldn't change his meds now.  Thanks.

## 2010-09-30 NOTE — Telephone Encounter (Signed)
Message copied by Christena Deem on Fri Sep 30, 2010  9:32 AM ------      Message from: Renford Dills      Created: Wed Sep 28, 2010  1:02 PM       Please notify Pt.  I sent a not to Dr. Rockey Situ.  We want him to check his BP for another week and then send me the numbers.  I wouldn't change his meds now.  Thanks.

## 2010-09-30 NOTE — Telephone Encounter (Signed)
Patient advised of instructions.

## 2010-10-11 ENCOUNTER — Other Ambulatory Visit: Payer: Self-pay | Admitting: *Deleted

## 2010-10-11 MED ORDER — INSULIN ASPART 100 UNIT/ML ~~LOC~~ SOLN: 50.0000 [IU] | Freq: Three times a day (TID) | SUBCUTANEOUS | Status: DC

## 2010-10-12 ENCOUNTER — Encounter: Payer: Self-pay | Admitting: Family Medicine

## 2010-10-20 ENCOUNTER — Encounter: Payer: Self-pay | Admitting: Emergency Medicine

## 2010-10-20 ENCOUNTER — Telehealth: Payer: Self-pay | Admitting: Family Medicine

## 2010-10-20 DIAGNOSIS — I10 Essential (primary) hypertension: Secondary | ICD-10-CM

## 2010-10-20 NOTE — Telephone Encounter (Signed)
Patient advised.  OV scheduled 10/28/2010 at 9:30 a.m.

## 2010-10-20 NOTE — Telephone Encounter (Signed)
Please call pt.  I wouldn't change insulin at this point.  I would continue on current dose and work on DM2 diet.  BP is still up.  I would inc the lisinopril to 40mg  a day (2 of the 20mg  tabs a day) and get OV in ~1 week for BP check and bmet at the [facility].  Keep checking BP in meantime. Thanks.

## 2010-10-21 ENCOUNTER — Encounter: Payer: Self-pay | Admitting: Family Medicine

## 2010-10-24 ENCOUNTER — Encounter: Payer: Self-pay | Admitting: Emergency Medicine

## 2010-10-24 ENCOUNTER — Ambulatory Visit (INDEPENDENT_AMBULATORY_CARE_PROVIDER_SITE_OTHER): Payer: Medicare PPO | Admitting: Emergency Medicine

## 2010-10-24 DIAGNOSIS — G4733 Obstructive sleep apnea (adult) (pediatric): Secondary | ICD-10-CM

## 2010-10-24 DIAGNOSIS — J8410 Pulmonary fibrosis, unspecified: Secondary | ICD-10-CM

## 2010-10-24 NOTE — Assessment & Plan Note (Signed)
Continue current CPAP qhs

## 2010-10-24 NOTE — Progress Notes (Deleted)
  Subjective:    Patient ID: Stephen [name], male    DOB: Sep 06, 1945, 65 y.o.   MRN: [id number]  Shortness of Breath      Review of Systems  Respiratory: Positive for shortness of breath.        Objective:   Physical Exam        Assessment & Plan:

## 2010-10-24 NOTE — Patient Instructions (Signed)
We will obtain copies of your records from [facility], especially your 6 minute walks, PFT's, old CT scans of the chest We will repeat your 6 minute walk next visit We will decide when to do your next CXR and/or CT scan depending on your old studies and your 6 minute walk results.  Follow up with Dr Lamonte Sakai next available with 6 minute walk.

## 2010-10-24 NOTE — Assessment & Plan Note (Signed)
Presumed due to metal dust exposure and pneumoconiosis. Has been followed at [facility] with films and 6 minute walks - continue O2 with exertion - get copies of CT scans and 6 minute walks from [facility] - 6 minute walk next visit (on O2) - rov next available.

## 2010-10-24 NOTE — Progress Notes (Signed)
Subjective:    Patient ID: Stephen [name], male    DOB: 1946-01-03, 65 y.o.   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 He underwent trial of pred without much response. Started on O2 - uses 2L/min with exertion. Last CT Scan done at 65   [facility], about a yr ago. His symptoms are stable - able to walk thru store without stopping ON his O2, able to walk a city block. Has has 6 minute walks at [facility]. Here to establish local pulm f/u after his insurance changed.    Review of Systems  Respiratory: Positive for cough and shortness of breath.   Cardiovascular: Positive for leg swelling.   Past Medical History  Diagnosis Date  . 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 DM2 (diabetes mellitus, type 2)     insulin requiring  . Diabetes mellitus with neuropathy   . OSA (obstructive sleep apnea)   . HTN (hypertension)   . CHF (congestive heart failure)   . Kidney stones   . Dyslipidemia   . Metabolic syndrome   . Arthritis   . GERD (gastroesophageal reflux disease)   . Heart disease   . High cholesterol   . Kidney disease   . Colon polyps   . Thyroid disorder   . Recurrent UTI   . Pulmonary fibrosis     s/p wedge resection 11/09 consistent w metal worker's pneumoconiosis, chronic o2 use     Family History  Problem Relation Age of Onset  . Heart attack Father   . Heart attack Mother   . Arthritis Other     grandparents  . Breast cancer Other     1st degree relative  . Diabetes Other     1st degree relative  . Hyperlipidemia Other     grandparents  . Hypertension Other     grandparents  . Sudden death Mother   . Sudden death Father   . Heart attack Brother      History   Social History  . Marital Status: Married    Spouse Name: N/A    Number of Children: N/A  . Years of Education: N/A   Occupational History  . 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     Social History Main Topics  . Smoking status: Never Smoker   . Smokeless tobacco: Not on file  . Alcohol Use: No  . Drug Use: No  . Sexually Active: Not on file   Other Topics Concern  . Not on file   Social History Narrative   Enjoys fishingYankees fan     No Known Allergies   Outpatient Prescriptions Prior to Visit  Medication Sig Dispense Refill  . amitriptyline (ELAVIL) 50 MG tablet Take 25 mg by mouth at bedtime.       Marland Kitchen aspirin 81 MG tablet Take 81 mg by mouth 2 (two) times daily.        Marland Kitchen atorvastatin (LIPITOR) 80 MG tablet Take 80 mg by mouth at bedtime.        . carvedilol (COREG) 25 MG tablet Take 25 mg by mouth 2 (two) times daily with a meal.        . cloNIDine (CATAPRES) 0.2 MG tablet Take 0.2 mg by mouth  2 (two) times daily.        . clopidogrel (PLAVIX) 75 MG tablet Take 75 mg by mouth daily.        . famotidine (PEPCID) 20 MG tablet Take 20 mg by mouth daily.        . furosemide (LASIX) 80 MG tablet Take 80 mg by mouth every other day.        . gabapentin (NEURONTIN) 800 MG tablet 1 tablet in the morning and 1 and 1/2 at bedtime       . insulin aspart (NOVOLOG) 100 UNIT/ML injection Inject 50 Units into the skin 3 (three) times daily before meals.  15 mL  1  . insulin glargine (LANTUS) 100 UNIT/ML injection Inject 95 Units into the skin every morning.  10 mL  0  . isosorbide mononitrate (IMDUR) 60 MG 24 hr tablet Take 60 mg by mouth daily.        . nitroGLYCERIN (NITROSTAT) 0.4 MG SL tablet Place 0.4 mg under the tongue every 5 (five) minutes as needed. May repeat x3        . traMADol (ULTRAM) 50 MG tablet Take 50 mg by mouth daily.        Marland Kitchen lisinopril (PRINIVIL,ZESTRIL) 20 MG tablet Take 20 mg by mouth daily.              Objective:   Physical Exam Gen: Pleasant, obese man, in no distress,  normal affect  ENT: No lesions,  mouth clear,  oropharynx clear, no  postnasal drip  Neck: No JVD, no TMG, no carotid bruits  Lungs: No use of accessory muscles, very soft basilar insp crackles, no wheezes  Cardiovascular: distant, RRR, heart sounds normal, no murmur or gallops, 1+ pitting edema  Abdomen: obese, soft, benign  Musculoskeletal: No deformities, no cyanosis or clubbing  Neuro: alert, non focal  Skin: Warm, no lesions or rashes      Assessment & Plan:  PULMONARY FIBROSIS Presumed due to metal dust exposure and pneumoconiosis. Has been followed at [facility] with films and 6 minute walks - continue O2 with exertion - get copies of CT scans and 6 minute walks from [facility] - 6 minute walk next visit (on O2) - rov next available.   SLEEP APNEA, OBSTRUCTIVE Continue current CPAP qhs

## 2010-10-28 ENCOUNTER — Ambulatory Visit (INDEPENDENT_AMBULATORY_CARE_PROVIDER_SITE_OTHER): Payer: Medicare PPO | Admitting: Family Medicine

## 2010-10-28 ENCOUNTER — Encounter: Payer: Self-pay | Admitting: Emergency Medicine

## 2010-10-28 ENCOUNTER — Encounter: Payer: Self-pay | Admitting: Family Medicine

## 2010-10-28 DIAGNOSIS — I10 Essential (primary) hypertension: Secondary | ICD-10-CM

## 2010-10-28 DIAGNOSIS — E119 Type 2 diabetes mellitus without complications: Secondary | ICD-10-CM

## 2010-10-28 LAB — BASIC METABOLIC PANEL
BUN: 12 mg/dL (ref 6–23)
CO2: 28 mEq/L (ref 19–32)
Calcium: 8.7 mg/dL (ref 8.4–10.5)
Chloride: 108 mEq/L (ref 96–112)
Creatinine, Ser: 1.1 mg/dL (ref 0.4–1.5)
GFR: 69.02 mL/min (ref >60.00)
Glucose, Bld: 132 mg/dL — ABNORMAL HIGH (ref 70–99)
Potassium: 4.2 mEq/L (ref 3.5–5.1)
Sodium: 141 mEq/L (ref 135–145)

## 2010-10-28 NOTE — Patient Instructions (Signed)
Don't change your meds for now.  Keep taking 40mg  of lisinopril a day.  You can get your results through our phone system.  Follow the instructions on the blue card.  Let us know if you have more swelling or more lightheadedness. Schedule a follow up appointment in: 3 months-9min OV with labs ahead of time.  Take care.

## 2010-10-28 NOTE — Assessment & Plan Note (Addendum)
Improved.  Check bmet today and notify pt.  No other change in meds for now.  Will recheck A1c in 3 months.

## 2010-10-28 NOTE — Progress Notes (Signed)
Hypertension:    Using medication without problems or lightheadedness: occ with standing, this self resolves quickly, tolerable per patient, minimal inc of this on the higher ACE dose Chest pain with exertion: occ with exertion, at baseline Edema:at baseline Short of breath: Average home BPs: 130/68 yesterday, coming down on the higher ACE dose.  Other issues: due for labs today Sugar 147 yesterday AM.    Meds, vitals, and allergies reviewed.   ROS: See HPI.  Otherwise negative.    GEN: nad, alert and oriented, on O2, obese HEENT: mucous membranes moist NECK: supple w/o LA CV: rrr. PULM: ctab, no inc wob ABD: soft, +bs EXT: 2+ edema SKIN: no acute rash

## 2010-11-01 ENCOUNTER — Encounter: Payer: Self-pay | Admitting: Cardiovascular Disease

## 2010-11-01 ENCOUNTER — Ambulatory Visit (INDEPENDENT_AMBULATORY_CARE_PROVIDER_SITE_OTHER): Payer: Medicare PPO | Admitting: Emergency Medicine

## 2010-11-01 ENCOUNTER — Ambulatory Visit (INDEPENDENT_AMBULATORY_CARE_PROVIDER_SITE_OTHER): Payer: Medicare PPO | Admitting: Cardiovascular Disease

## 2010-11-01 ENCOUNTER — Encounter: Payer: Self-pay | Admitting: Emergency Medicine

## 2010-11-01 DIAGNOSIS — I2510 Atherosclerotic heart disease of native coronary artery without angina pectoris: Secondary | ICD-10-CM

## 2010-11-01 DIAGNOSIS — R0609 Other forms of dyspnea: Secondary | ICD-10-CM

## 2010-11-01 DIAGNOSIS — I10 Essential (primary) hypertension: Secondary | ICD-10-CM

## 2010-11-01 DIAGNOSIS — G4733 Obstructive sleep apnea (adult) (pediatric): Secondary | ICD-10-CM

## 2010-11-01 DIAGNOSIS — R0989 Other specified symptoms and signs involving the circulatory and respiratory systems: Secondary | ICD-10-CM

## 2010-11-01 DIAGNOSIS — J8410 Pulmonary fibrosis, unspecified: Secondary | ICD-10-CM

## 2010-11-01 DIAGNOSIS — E8881 Metabolic syndrome: Secondary | ICD-10-CM

## 2010-11-01 DIAGNOSIS — E1149 Type 2 diabetes mellitus with other diabetic neurological complication: Secondary | ICD-10-CM

## 2010-11-01 DIAGNOSIS — R0600 Dyspnea, unspecified: Secondary | ICD-10-CM

## 2010-11-01 DIAGNOSIS — N189 Chronic kidney disease, unspecified: Secondary | ICD-10-CM

## 2010-11-01 MED ORDER — LISINOPRIL 40 MG PO TABS: 40.0000 mg | ORAL_TABLET | Freq: Every day | ORAL | Status: DC

## 2010-11-01 NOTE — Assessment & Plan Note (Signed)
Continue CPAP qhs 

## 2010-11-01 NOTE — Patient Instructions (Signed)
You are doing well. No medication changes were made.  Please call us over the bext few weeks with your blood pressure numbers.  Please call us if you have new issues that need to be addressed before your next appt.  We will call you for a follow up Appt. In 6 months

## 2010-11-01 NOTE — Assessment & Plan Note (Signed)
Will obtain and review the 6 min walks and PFT from [facility] Continue current o2 Follow up in 4 months of prn

## 2010-11-01 NOTE — Assessment & Plan Note (Signed)
He is unable to tolerate CPAP. He uses nasal cannula oxygen at night.

## 2010-11-01 NOTE — Progress Notes (Signed)
Patient ID: Stephen [name], male    DOB: 05-01-1946, 65 y.o.   MRN: [id number]  HPI Comments: Stephen [name] is a very pleasant 65 year old gentleman with a history of coronary artery disease, PCI to the mid RCA and mid left circumflex in October 2007, interstitial fibrosis, with wedge resection, hypertension, hyperlipidemia, remote tobacco abuse, obesity, chronic substernal chest pain who presents for routine followup.   Overall, he reports being stable. He does not check his blood pressure at home. He continues on oxygen by nasal cannula, increasing to 3 L with walking. He has had remote episodes of chest pain lasting no more than 30 seconds that come on while sitting. He denies any chest pain while exerting himself. He continues to have chronic edema that is relatively unchanged. He is having difficulty working on his weight. He reports that his lisinopril was increased to 40 mg recently for hypertension.    Cardiac catheterization in July 2008 shows 10% proximal LAD disease, otherwise no significant stenoses. Stent to the mid RCA placed August 2007 was patent, stent with DES to the mid left circumflex in October 2007 also patent.   Open lung biopsy was performed November 2009 for bilateral pulmonary infiltrates.   Stress test July 2011 was dobutamine stress that showed no significant ischemia, inadequate heart rate was achieved.   right and left heart catheterization January 2011 showed 50% mid LAD disease, 60% at the ostium of the diagonal #2, 30% proximal left circumflex disease, patent stent in the left circumflex, patent stent of the mid RCA, wedge pressure of 16, PA pressure mean 22, right ventricular pressure 41/8 .   echocardiogram September 2010 shows normal systolic function, diastolic dysfunction, normal RV function unable to evaluate right ventricular systolic pressures   EKG shows normal sinus rhythm with rate 68 beats per minute with  left axis deviation        Review of  Systems  Constitutional: Negative.   HENT: Negative.   Eyes: Negative.   Respiratory: Positive for shortness of breath.   Cardiovascular: Positive for chest pain and leg swelling.  Gastrointestinal: Negative.   Musculoskeletal: Negative.   Skin: Negative.   Neurological: Negative.   Hematological: Negative.   Psychiatric/Behavioral: Negative.   All other systems reviewed and are negative.   BP 152/70  Pulse 68  Ht 5\' 5"  (1.651 m)  Wt 289 lb (131.09 kg)  BMI 48.09 kg/m2  Physical Exam  Nursing note and vitals reviewed. Constitutional: He is oriented to person, place, and time. He appears well-developed and well-nourished.       Morbidly obese.  HENT:  Head: Normocephalic.  Nose: Nose normal.  Mouth/Throat: Oropharynx is clear and moist.  Eyes: Conjunctivae are normal. Pupils are equal, round, and reactive to light.  Neck: Normal range of motion. Neck supple. No JVD present.  Cardiovascular: Normal rate, regular rhythm, S1 normal, S2 normal, normal heart sounds and intact distal pulses.  Exam reveals no gallop and no friction rub.   No murmur heard. Pulmonary/Chest: Effort normal and breath sounds normal. No respiratory distress. He has no wheezes. He has no rales. He exhibits no tenderness.  Abdominal: Soft. Bowel sounds are normal. He exhibits no distension. There is no tenderness.  Musculoskeletal: Normal range of motion. He exhibits edema. He exhibits no tenderness.  Lymphadenopathy:    He has no cervical adenopathy.  Neurological: He is alert and oriented to person, place, and time. Coordination normal.  Skin: Skin is warm and dry. No rash noted. No  erythema.  Psychiatric: He has a normal mood and affect. His behavior is normal. Judgment and thought content normal.           Assessment and Plan

## 2010-11-01 NOTE — Assessment & Plan Note (Signed)
Chest pain symptoms are very atypical. I have discussed this with him and he is comfortable monitoring his symptoms for now with no further workup.

## 2010-11-01 NOTE — Assessment & Plan Note (Signed)
We have talked about his weight with him. He did lose weight rapidly in the past following a rigid diet. We have encouraged him to follow a strict diabetic diet in an effort to lose weight.

## 2010-11-01 NOTE — Assessment & Plan Note (Signed)
Blood pressure continues to be mildly elevated today. We have asked him to closely monitor his blood pressure at home and contact our office or Dr. [name] in the next several weeks for further medication adjustment. Given his underlying edema, we would stay away from calcium channel blockers.

## 2010-11-01 NOTE — Patient Instructions (Signed)
Continue your oxygen at 2L/min at all times Continue your CPAP every night Follow up with Dr Rockey Situ as planned for your blood pressure Follow up with Dr Lamonte Sakai in 4 months or prn

## 2010-11-01 NOTE — Progress Notes (Signed)
  Subjective:    Patient ID: Stephen [name], male    DOB: 13-Mar-1946, 65 y.o.   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 He underwent trial of pred without much response. Started on O2 - uses 2L/min with exertion. Last CT Scan done at [facility], about a yr ago. His symptoms are stable - able to walk thru store without stopping ON his O2, able to walk a city block. Has has 6 minute walks at [facility]. Here to establish local pulm f/u after his insurance changed.   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 Also w OSA. Has been doing fairly well, having trouble with high BP. Meds being adjusted by Dr [name], Dr Rockey Situ - saw him this am. Note on lisinopril, has some cough but not that bothersome. Has had some dyspnea even when wearing his O2. Continues to have significant LE edema. 6 minute walk today = 273 meters. Wears his CPAP reliably.     Review of Systems Review of Systems  Respiratory: Positive for cough and shortness of breath.  Cardiovascular: Positive for leg swelling     Objective:   Physical Exam Gen: Pleasant, obese man, in no distress, normal affect  ENT: No lesions, mouth clear, oropharynx clear, no postnasal drip  Neck: No JVD, no TMG, no carotid bruits  Lungs: No use of accessory muscles, very soft basilar insp crackles, no wheezes  Cardiovascular: distant, RRR, heart sounds normal, no murmur or gallops, 1+ pitting edema  Abdomen: obese, soft, benign  Musculoskeletal: No deformities, no cyanosis or clubbing  Neuro: alert, non focal  Skin: Warm, no lesions or rashes      6 minute walk 11/01/10:  222m on 2L/min O2  Assessment & Plan:

## 2010-11-01 NOTE — Assessment & Plan Note (Signed)
We have encouraged continued exercise, careful diet management in an effort to lose weight. 

## 2010-11-01 NOTE — Assessment & Plan Note (Signed)
Recent renal function is within a normal range. I suspect that with over diuresis, he will have a increase in his BUN and creatinine. His fluid retention in the legs is likely from venous insufficiency, mild diastolic dysfunction and pulmonary hypertension from his pulmonary fibrosis.

## 2010-11-02 ENCOUNTER — Encounter: Payer: Self-pay | Admitting: Family Medicine

## 2010-11-14 ENCOUNTER — Encounter: Payer: Self-pay | Admitting: Emergency Medicine

## 2010-11-23 ENCOUNTER — Other Ambulatory Visit: Payer: Self-pay | Admitting: *Deleted

## 2010-11-23 MED ORDER — INSULIN ASPART 100 UNIT/ML ~~LOC~~ SOLN: 50.0000 [IU] | Freq: Three times a day (TID) | SUBCUTANEOUS | Status: DC

## 2010-12-06 ENCOUNTER — Ambulatory Visit (INDEPENDENT_AMBULATORY_CARE_PROVIDER_SITE_OTHER): Payer: Medicare PPO | Admitting: Family Medicine

## 2010-12-06 ENCOUNTER — Encounter: Payer: Self-pay | Admitting: Family Medicine

## 2010-12-06 ENCOUNTER — Other Ambulatory Visit: Payer: Medicare Other

## 2010-12-06 VITALS — BP 170/84 | HR 64 | Temp 98.7°F | Wt 293.0 lb

## 2010-12-06 DIAGNOSIS — E1149 Type 2 diabetes mellitus with other diabetic neurological complication: Secondary | ICD-10-CM

## 2010-12-06 DIAGNOSIS — M549 Dorsalgia, unspecified: Secondary | ICD-10-CM | POA: Insufficient documentation

## 2010-12-06 DIAGNOSIS — E119 Type 2 diabetes mellitus without complications: Secondary | ICD-10-CM

## 2010-12-06 DIAGNOSIS — M545 Low back pain, unspecified: Secondary | ICD-10-CM

## 2010-12-06 DIAGNOSIS — N200 Calculus of kidney: Secondary | ICD-10-CM | POA: Insufficient documentation

## 2010-12-06 LAB — POCT URINALYSIS DIPSTICK
Bilirubin, UA: NEGATIVE
Glucose, UA: NEGATIVE
Ketones, UA: NEGATIVE
Leukocytes, UA: NEGATIVE
Nitrite, UA: NEGATIVE
Spec Grav, UA: 1.02
Urobilinogen, UA: 0.2
pH, UA: 5

## 2010-12-06 MED ORDER — HYDROCODONE-ACETAMINOPHEN 5-500 MG PO TABS: 1.0000 | ORAL_TABLET | Freq: Three times a day (TID) | ORAL | Status: AC | PRN

## 2010-12-06 NOTE — Progress Notes (Signed)
Back pain. B back pain R(constant)>L (intermittent), R groin pain (intermittent).  No dysuria.  Urine looks darker some days more than others "rust colored" per patient- this started at about the same time, about 2 weeks ago.  H/o known stones, but this isn't as painful as prev episodes.  No trauma, no trigger known.  Some nausea with episodes.  Tramadol isn't helping the pain.    H/o uncontrolled DM2.  Sugar has been under better control per patient.  Usually ~120 in AM.  Needs replacement for lantus due to cost.  Due for repeat A1c.   PMH/SH, Meds, vitals, and allergies reviewed.   ROS: See HPI.  Otherwise, noncontributory.  Nad, on O2 at baseline ncat Mmm rrr ctab Back w/o midline pain, +B paraspinal muscles aren't ttp, but he has reported pain in R>L lower back even w/o palpation abd soft, obese, not ttp R groin is painful, but not ttp- similar to his back No hernia felt but habitus limits the exam Testicles not ttp, scrotum w/o acute abnormality noted 2-3+ edema on BLE, at baseline

## 2010-12-06 NOTE — Patient Instructions (Addendum)
Call your insurance company and see what they can do to substitute for the lantus.  Let me know.  Rosaria Ferries will call you about your referral.  Take the vicodin for pain and we'll let you know about your labs.

## 2010-12-07 ENCOUNTER — Encounter: Payer: Self-pay | Admitting: Family Medicine

## 2010-12-07 ENCOUNTER — Ambulatory Visit: Payer: Self-pay | Admitting: Urology

## 2010-12-07 LAB — HEMOGLOBIN A1C: Hgb A1c MFr Bld: 8.1 % — ABNORMAL HIGH (ref 4.6–6.5)

## 2010-12-07 NOTE — Assessment & Plan Note (Signed)
Last A1c was elevated.  This may explain the proteinuria.  Will check A1c today.

## 2010-12-07 NOTE — Assessment & Plan Note (Signed)
I talked to patient about this.  Unclear source.  MSK vs possible stone?  Refer to uro, check ucx and aic in meantime given the protein and blood in urine, respectively.  I would hold on scanning in the meantime since he is nontoxic and treat the pain with vicodin.  He agreed.  He'll let me know if he isn't improving.  As prev, pt needs to lose weight.

## 2010-12-07 NOTE — Assessment & Plan Note (Signed)
I think it is reasonable to have have patient est care with uro, even if this isn't due to a stone.  See below.

## 2010-12-08 ENCOUNTER — Telehealth: Payer: Self-pay | Admitting: *Deleted

## 2010-12-08 LAB — URINE CULTURE: Colony Count: 4000

## 2010-12-08 MED ORDER — INSULIN DETEMIR 100 UNIT/ML ~~LOC~~ SOLN: 95.0000 [IU] | Freq: Every day | SUBCUTANEOUS | Status: DC

## 2010-12-08 NOTE — Telephone Encounter (Signed)
I think he means levemir, another long acting insulin.  Please clarify where he needs it sent.  Thanks.

## 2010-12-08 NOTE — Telephone Encounter (Signed)
He requests [facility] in [facility]

## 2010-12-08 NOTE — Telephone Encounter (Signed)
Patient says that he called his insurance company to see what could be substituted for lantus. He was told that he could get novalezemir, but has to be 3 vials at a time.

## 2010-12-08 NOTE — Telephone Encounter (Signed)
rx sent, please notify pt.

## 2010-12-09 NOTE — Telephone Encounter (Signed)
Patient aware.

## 2010-12-12 ENCOUNTER — Ambulatory Visit: Payer: Self-pay | Admitting: Urology

## 2010-12-19 ENCOUNTER — Ambulatory Visit: Payer: Medicare Other | Admitting: Family Medicine

## 2010-12-23 ENCOUNTER — Ambulatory Visit: Payer: Self-pay | Admitting: Urology

## 2011-01-18 ENCOUNTER — Telehealth: Payer: Self-pay | Admitting: *Deleted

## 2011-01-18 NOTE — Telephone Encounter (Signed)
I talked to pt/wife.  He can still make urine, but with some discomfort.  Per pt/wife, there was difficulty getting him scheduled.  Please call over to the [place] [facility]) and see what can be done about getting him on the schedule and what can be done about getting records from them.  Thanks.

## 2011-01-18 NOTE — Telephone Encounter (Signed)
Patient was supposed to have an appt today with urology, but it was canceled and patient was told that they don't know when they will be able to see him. Wife says that patient is still having a lot of trouble urinating, and is in a lot of pain. She says that at times he can't urinate at all. She is asking if you could refer him somewhere else that could get him in pretty quick or if you have any other recommendations. Please advise.

## 2011-01-20 ENCOUNTER — Telehealth: Payer: Self-pay | Admitting: Family Medicine

## 2011-01-20 NOTE — Telephone Encounter (Signed)
App help of all involved.  Will defer to uro.

## 2011-01-20 NOTE — Telephone Encounter (Signed)
Called Stephen [name] and assured him that Dr Lolita Lenz office has a star after his name to get him rescheduled for the cystoscopy that had to be rescheduled due to staffing issues. Also told him we have received the medical records from Dr Ernst Spell as well. They are working hard to get him an appt next week and I relayed that info to the patient.

## 2011-01-25 ENCOUNTER — Other Ambulatory Visit (INDEPENDENT_AMBULATORY_CARE_PROVIDER_SITE_OTHER): Payer: Medicare PPO | Admitting: Family Medicine

## 2011-01-25 DIAGNOSIS — I10 Essential (primary) hypertension: Secondary | ICD-10-CM

## 2011-01-25 DIAGNOSIS — E119 Type 2 diabetes mellitus without complications: Secondary | ICD-10-CM

## 2011-01-25 LAB — HEMOGLOBIN A1C: Hgb A1c MFr Bld: 9.1 % — ABNORMAL HIGH (ref 4.6–6.5)

## 2011-01-30 ENCOUNTER — Encounter: Payer: Self-pay | Admitting: Family Medicine

## 2011-01-30 ENCOUNTER — Ambulatory Visit (INDEPENDENT_AMBULATORY_CARE_PROVIDER_SITE_OTHER): Payer: Medicare PPO | Admitting: Family Medicine

## 2011-01-30 VITALS — BP 160/80 | HR 70 | Temp 98.7°F | Ht 65.0 in | Wt 276.0 lb

## 2011-01-30 DIAGNOSIS — E119 Type 2 diabetes mellitus without complications: Secondary | ICD-10-CM

## 2011-01-30 DIAGNOSIS — E1149 Type 2 diabetes mellitus with other diabetic neurological complication: Secondary | ICD-10-CM

## 2011-01-30 LAB — CREATININE, SERUM: Creatinine, Ser: 1.3 mg/dL (ref 0.4–1.5)

## 2011-01-30 NOTE — Patient Instructions (Signed)
Talk to Stephen [name] to see if she can help you with getting insulin. Call the cardiology clinic about coming off plavix.  Fill out the sugar readings and send them back to me. Try to cut down on sweet tea. I'll be in touch about your labs and sugar readings.   Recheck fasting lab in 3 months at lab visit before 49min OV.

## 2011-01-30 NOTE — Progress Notes (Signed)
In midst of uro follow up.  We talked about this.  Still with difficulty voiding-variable- and some suprapubic pain.   Diabetes:  Had upset stomach- nausea, but no vomiting- and sweet tea helped with this but it ran his sugar up.  Using medications without difficulties:yes, but cost is an issue for him Hypoglycemic episodes: rare, at night Hyperglycemic episodes: occ Blood Sugars averaging:140 this AM, up to 180 in AM Eye exam: about 1 year ago-he's going to call about this.    PMH and SH reviewed  Meds, vitals, and allergies reviewed.   ROS: See HPI.  Otherwise negative.    GEN: nad, alert and oriented, on O2-baseline, obese HEENT: mucous membranes moist NECK: supple w/o LA CV: rrr. PULM: ctab, no inc wob ABD: soft, +bs, obese EXT: 2+ edema

## 2011-01-31 NOTE — Assessment & Plan Note (Addendum)
>  25 min spent with face to face with patient, >50% counseling.  Check Cr today given the problems he's had with UOP/BPH.  D/w pt about dietary changes.  He needs to get sugar down and avoid sweets. He may be developing sx of gastroparesis.  This was d/w pt.  Recheck in 3 months.  He'll check on drug assistance on the way out today. I asked for him to send me readings of pre/2-hour post meal sugars with comments about relative size of meals so I can help adjust his insulin doses.

## 2011-02-02 ENCOUNTER — Telehealth: Payer: Self-pay | Admitting: *Deleted

## 2011-02-02 NOTE — Telephone Encounter (Signed)
Pt calling to see if he can stop his Plavix and continue ASA? He saw Dr. [name] recently, and he wanted to know if you were ok with this.

## 2011-02-05 NOTE — Telephone Encounter (Signed)
Now that plavix generic, i would prefer he stay on it with asa 81 daily. Unless he has bleeding or bad bruising

## 2011-02-06 MED ORDER — CLOPIDOGREL BISULFATE 75 MG PO TABS: 75.0000 mg | ORAL_TABLET | Freq: Every day | ORAL | Status: DC

## 2011-02-06 NOTE — Telephone Encounter (Signed)
Spoke to pt, notified of msg below. He will continue Plavix, it was mainly a cost issue, notified pt will send in rx now that it is generic, if he has any problems with rx he will call us. Pt will also take ASA 81mg  daily.

## 2011-02-13 ENCOUNTER — Telehealth: Payer: Self-pay | Admitting: Family Medicine

## 2011-02-13 MED ORDER — INSULIN ASPART 100 UNIT/ML ~~LOC~~ SOLN: 50.0000 [IU] | Freq: Three times a day (TID) | SUBCUTANEOUS | Status: DC

## 2011-02-13 MED ORDER — INSULIN DETEMIR 100 UNIT/ML ~~LOC~~ SOLN: 100.0000 [IU] | Freq: Every day | SUBCUTANEOUS | Status: DC

## 2011-02-13 NOTE — Telephone Encounter (Signed)
Please call pt.  Have him inc to 100 units of levemir a day.  Continue novolog 50 units before each meal but give an extra 5 units with large meals (ie 55 units with large meals).  Keep checking sugars before and after meals and let me know about the numbers in about 1 week.  Thanks.

## 2011-02-13 NOTE — Telephone Encounter (Signed)
Tried to contact patient several times by telephone. No answer and patient has not set up his voicemail yet. Will continue to try to reach patient.

## 2011-02-13 NOTE — Telephone Encounter (Signed)
Patient notified as instructed by telephone. 

## 2011-02-24 ENCOUNTER — Ambulatory Visit (INDEPENDENT_AMBULATORY_CARE_PROVIDER_SITE_OTHER): Payer: Medicare PPO | Admitting: Family Medicine

## 2011-02-24 ENCOUNTER — Encounter: Payer: Self-pay | Admitting: Family Medicine

## 2011-02-24 DIAGNOSIS — R9389 Abnormal findings on diagnostic imaging of other specified body structures: Secondary | ICD-10-CM

## 2011-02-24 NOTE — Progress Notes (Signed)
CT done by uro and "they saw a spot on my liver and my adrenal gland."  H/o adrenal adenoma s/p resection.  His Uro MD wanted him to f/u with me about these lesions.    BPH per uro.  Still with some dysuria.   No FCNAVD.  Meds, vitals, and allergies reviewed.   ROS: See HPI.  Otherwise, noncontributory.  nad On O2 rrr ctab Ext with 1-2+ edema

## 2011-02-24 NOTE — Patient Instructions (Signed)
I'll talk to the radiologists at [facility] and then notify you.  Take care.

## 2011-02-24 NOTE — Assessment & Plan Note (Signed)
I have the disc with the CT on it.  I'll take it to radiology and get a reading.  We'll notify pt after that.

## 2011-02-26 ENCOUNTER — Telehealth: Payer: Self-pay | Admitting: Family Medicine

## 2011-02-26 NOTE — Telephone Encounter (Signed)
Please call pt.  I took the CD with the images over to [facility], but I wasn't able to manipulate the pictures to get a full idea of the results.  I need the report from [facility] about the scan.  Please see if you can get this.  Thanks.  Disk in my office, please give back to pt (we couldn't load the images at [facility]).  Thanks.

## 2011-02-27 NOTE — Telephone Encounter (Signed)
Recording says the number has been disconnected.  Tried several times.  Will hold disc, continuing to try to contact the patient.  Report requested from [facility] by fa

## 2011-02-28 NOTE — Telephone Encounter (Signed)
Advised Stephen [name] that the disc was not useful and that I was faxing for the report from [facility].  I told him that we would place the disc at the front desk and he could pick it up at his convenience or at his next appt.

## 2011-03-02 ENCOUNTER — Telehealth: Payer: Self-pay | Admitting: Family Medicine

## 2011-03-02 ENCOUNTER — Encounter: Payer: Self-pay | Admitting: Family Medicine

## 2011-03-02 DIAGNOSIS — D3500 Benign neoplasm of unspecified adrenal gland: Secondary | ICD-10-CM

## 2011-03-02 NOTE — Telephone Encounter (Signed)
I called pt.  The liver lesion on CT is too small to characterize.  I told pt that I couldn't definitely tell him what it was, but that it didn't appear ominous.  I offered Uro follow up about the adenoma.  I would like uro input as to need for biopsy, excision, and follow up.  I talked with his current urologist in [place], but he doesn't offer surgery for adenomas. Pt agrees with the plan.   Of note, pt had prev R adrenal adenoma removed years ago.

## 2011-03-03 ENCOUNTER — Telehealth: Payer: Self-pay

## 2011-03-03 MED ORDER — CLOPIDOGREL BISULFATE 75 MG PO TABS: 75.0000 mg | ORAL_TABLET | Freq: Every day | ORAL | Status: DC

## 2011-03-03 NOTE — Telephone Encounter (Signed)
Urology consult was scheduled with Dr Karsten Ro on 03/28/2011, patient aware of appt. [place]

## 2011-03-03 NOTE — Telephone Encounter (Signed)
Refill sent to [facility] for 90 day supply for clopidogrel.

## 2011-03-15 ENCOUNTER — Ambulatory Visit: Payer: Medicare PPO | Admitting: Emergency Medicine

## 2011-03-23 ENCOUNTER — Encounter: Payer: Self-pay | Admitting: Emergency Medicine

## 2011-03-23 ENCOUNTER — Ambulatory Visit (INDEPENDENT_AMBULATORY_CARE_PROVIDER_SITE_OTHER): Payer: Medicare PPO | Admitting: Emergency Medicine

## 2011-03-23 DIAGNOSIS — G4733 Obstructive sleep apnea (adult) (pediatric): Secondary | ICD-10-CM

## 2011-03-23 DIAGNOSIS — J8410 Pulmonary fibrosis, unspecified: Secondary | ICD-10-CM

## 2011-03-23 NOTE — Assessment & Plan Note (Signed)
Continue current CPAP 

## 2011-03-23 NOTE — Patient Instructions (Signed)
Please continue your CPAP every night Wear your oxygen with exertion.  Follow up with Dr Lamonte Sakai in 6 months with a CXR, sooner if you have any problems.

## 2011-03-23 NOTE — Progress Notes (Signed)
  Subjective:    Patient ID: Stephen [name], male    DOB: [birth year], 65 y.o.   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 He underwent trial of pred without much response. Started on O2 - uses 2L/min with exertion. Last CT Scan done at [facility], about a yr ago. His symptoms are stable - able to walk thru store without stopping ON his O2, able to walk a city block. Has has 6 minute walks at [facility]. Here to establish local pulm f/u after his insurance changed.   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 Also w OSA. Has been doing fairly well, having trouble with high BP. Meds being adjusted by Dr [name], Dr Rockey Situ - saw him this am. Note on lisinopril, has some cough but not that bothersome. Has had some dyspnea even when wearing his O2. Continues to have significant LE edema. 6 minute walk today = 273 meters. Wears his CPAP reliably.   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 Also w OSA. Feeling about the same. Wears his O2 at 2L/min. Has been coughing more for last two mo. Has some breakthru GERD esp when forgets his medication. He is on lisinopril. Since last time, evaluated w CT abd that showed adrenal lesion and possible hepatic nodule - workup is underway to evaluate.  Doing OK on CPAP.     Objective:   Physical Exam Gen: Pleasant, obese man, in no distress, normal affect  ENT: No lesions, mouth clear, oropharynx clear, no postnasal drip  Neck: No JVD, no TMG, no carotid bruits  Lungs: No use of accessory muscles, very soft basilar insp crackles, no wheezes  Cardiovascular: distant, RRR, heart sounds normal, no murmur or gallops, 1+ pitting edema  Abdomen: obese, soft, benign  Musculoskeletal: No deformities, no cyanosis or clubbing  Neuro: alert, non focal  Skin: Warm, no lesions or rashes  6 minute walk 11/01/10:  254m on 2L/min  O2  Assessment & Plan:  SLEEP APNEA, OBSTRUCTIVE Continue current CPAP  PULMONARY FIBROSIS Stable  - plan CXR and rov in 6 months or prn

## 2011-03-23 NOTE — Assessment & Plan Note (Signed)
Stable  - plan CXR and rov in 6 months or prn

## 2011-03-31 ENCOUNTER — Encounter: Payer: Self-pay | Admitting: Family Medicine

## 2011-04-03 ENCOUNTER — Telehealth: Payer: Self-pay | Admitting: *Deleted

## 2011-04-03 DIAGNOSIS — K769 Liver disease, unspecified: Secondary | ICD-10-CM

## 2011-04-03 NOTE — Telephone Encounter (Signed)
Pt would like to be referred to a specialist regarding the spot on his liver, he has no preference where.

## 2011-04-03 NOTE — Telephone Encounter (Signed)
The liver lesions are too small to characterize and likely insignificant.  I don't think this needs further eval.  If he wants second opinion, then refer to GI.  I put in the order, let me know if I need to cancel it.  Thanks.

## 2011-04-04 ENCOUNTER — Encounter: Payer: Self-pay | Admitting: Internal Medicine

## 2011-04-04 NOTE — Telephone Encounter (Signed)
Appt made with Dr Zenovia Jarred on 04/28/2011 at 3:15pm patient notified. [place]

## 2011-04-14 ENCOUNTER — Other Ambulatory Visit: Payer: Self-pay | Admitting: *Deleted

## 2011-04-14 MED ORDER — CARVEDILOL 25 MG PO TABS: 25.0000 mg | ORAL_TABLET | Freq: Two times a day (BID) | ORAL | Status: DC

## 2011-04-14 MED ORDER — ISOSORBIDE MONONITRATE ER 60 MG PO TB24: 60.0000 mg | ORAL_TABLET | Freq: Every day | ORAL | Status: DC

## 2011-04-20 ENCOUNTER — Other Ambulatory Visit (INDEPENDENT_AMBULATORY_CARE_PROVIDER_SITE_OTHER): Payer: Medicare PPO

## 2011-04-20 ENCOUNTER — Encounter: Payer: Self-pay | Admitting: Internal Medicine

## 2011-04-20 DIAGNOSIS — E119 Type 2 diabetes mellitus without complications: Secondary | ICD-10-CM

## 2011-04-20 LAB — COMPREHENSIVE METABOLIC PANEL
ALT: 26 U/L (ref 0–53)
AST: 19 U/L (ref 0–37)
Albumin: 3.2 g/dL — ABNORMAL LOW (ref 3.5–5.2)
Alkaline Phosphatase: 82 U/L (ref 39–117)
BUN: 19 mg/dL (ref 6–23)
CO2: 29 mEq/L (ref 19–32)
Calcium: 8.7 mg/dL (ref 8.4–10.5)
Chloride: 108 mEq/L (ref 96–112)
Creatinine, Ser: 1.1 mg/dL (ref 0.4–1.5)
GFR: 74.95 mL/min (ref >60.00)
Glucose, Bld: 128 mg/dL — ABNORMAL HIGH (ref 70–99)
Potassium: 4.1 mEq/L (ref 3.5–5.1)
Sodium: 142 mEq/L (ref 135–145)
Total Bilirubin: 0.6 mg/dL (ref 0.3–1.2)
Total Protein: 6.7 g/dL (ref 6.0–8.3)

## 2011-04-20 LAB — LIPID PANEL
Cholesterol: 148 mg/dL (ref 0–200)
HDL: 34.1 mg/dL — ABNORMAL LOW (ref >39.00)
LDL Cholesterol: 91 mg/dL (ref 0–99)
Total CHOL/HDL Ratio: 4
Triglycerides: 117 mg/dL (ref 0.0–149.0)
VLDL: 23.4 mg/dL (ref 0.0–40.0)

## 2011-04-20 LAB — HEMOGLOBIN A1C: Hgb A1c MFr Bld: 9.2 % — ABNORMAL HIGH (ref 4.6–6.5)

## 2011-04-25 ENCOUNTER — Encounter: Payer: Self-pay | Admitting: Family Medicine

## 2011-04-25 ENCOUNTER — Ambulatory Visit (INDEPENDENT_AMBULATORY_CARE_PROVIDER_SITE_OTHER): Payer: Medicare PPO | Admitting: Family Medicine

## 2011-04-25 VITALS — BP 144/86 | HR 64 | Temp 97.6°F | Wt 273.0 lb

## 2011-04-25 DIAGNOSIS — I10 Essential (primary) hypertension: Secondary | ICD-10-CM

## 2011-04-25 DIAGNOSIS — Z23 Encounter for immunization: Secondary | ICD-10-CM

## 2011-04-25 DIAGNOSIS — E119 Type 2 diabetes mellitus without complications: Secondary | ICD-10-CM

## 2011-04-25 DIAGNOSIS — E1149 Type 2 diabetes mellitus with other diabetic neurological complication: Secondary | ICD-10-CM

## 2011-04-25 DIAGNOSIS — E785 Hyperlipidemia, unspecified: Secondary | ICD-10-CM

## 2011-04-25 MED ORDER — INSULIN NPH (HUMAN) (ISOPHANE) 100 UNIT/ML ~~LOC~~ SUSP: SUBCUTANEOUS | Status: DC

## 2011-04-25 MED ORDER — FAMOTIDINE 20 MG PO TABS: 20.0000 mg | ORAL_TABLET | Freq: Every day | ORAL | Status: DC

## 2011-04-25 NOTE — Patient Instructions (Signed)
Start the NPH insulin with 10units in the morning.  Call back with update on sugars next week.   Take care.   Plan to recheck A1c in 3 months with OV a few days later.

## 2011-04-25 NOTE — Progress Notes (Signed)
He was uro and was rec'd no intervention on adrenal lesion.  Urination is improved.  Has f/u with GI about liver lesions pending.   Diabetes:  Using medications without difficulties: off levemir for 1 month.  Can't afford levemir.   Hypoglycemic episodes: no Hyperglycemic episodes: yes Feet problems: Blood Sugars averaging: usually ~150 pre meals.   Hypertension:    Using medication without problems: yes with occ lightheadedness that self resolves Chest pain with exertion:no Edema: yes, at baseline Short of breath: yes, at baseline, on O2.  Average home BPs: now ~140/80s at home check.   Other issues: Prev with HA and inc in BP.  This was a few weeks ago.  Resolved in meantime.   Elevated Cholesterol: Using medications without problems:yes Muscle aches: no Diet compliance: "about the same."  Working on low fat/carb diet Exercise:minimal due to lung disease  PMH and SH reviewed.   Vital signs, Meds and allergies reviewed.  ROS: See HPI.  Otherwise nontributory.   GEN: nad, alert and oriented, on O2 HEENT: mucous membranes moist NECK: supple w/o LA CV: rrr PULM: ctab, no inc wob ABD: soft, +bs EXT: 1-2 + edema, at baseline SKIN: no acute rash  Diabetic foot exam: Normal inspection No skin breakdown No calluses  Normal DP pulses Dec sensation to light tough and monofilament on soles of feet Nails thickened

## 2011-04-26 DIAGNOSIS — E785 Hyperlipidemia, unspecified: Secondary | ICD-10-CM | POA: Insufficient documentation

## 2011-04-26 NOTE — Assessment & Plan Note (Signed)
Add on NPH and he'll call back with update on sugars.  Continue short acting insulin in meantime. Work on [employer] and diet.  D/w pt.  >25 min spent with face to face with patient, >50% counseling.  Will gradually inc NPH and hopefully dec short acting insulin.

## 2011-04-26 NOTE — Assessment & Plan Note (Signed)
Continue current meds.  D/w pt about weight.

## 2011-04-26 NOTE — Assessment & Plan Note (Signed)
Continue current meds and work on diet/weight.

## 2011-04-28 ENCOUNTER — Ambulatory Visit (INDEPENDENT_AMBULATORY_CARE_PROVIDER_SITE_OTHER): Payer: Medicare PPO | Admitting: Internal Medicine

## 2011-04-28 ENCOUNTER — Other Ambulatory Visit (INDEPENDENT_AMBULATORY_CARE_PROVIDER_SITE_OTHER): Payer: Medicare PPO

## 2011-04-28 ENCOUNTER — Encounter: Payer: Self-pay | Admitting: Internal Medicine

## 2011-04-28 VITALS — BP 150/84 | HR 80 | Ht 65.5 in | Wt 267.2 lb

## 2011-04-28 DIAGNOSIS — K7689 Other specified diseases of liver: Secondary | ICD-10-CM

## 2011-04-28 DIAGNOSIS — K769 Liver disease, unspecified: Secondary | ICD-10-CM

## 2011-04-28 DIAGNOSIS — K635 Polyp of colon: Secondary | ICD-10-CM

## 2011-04-28 DIAGNOSIS — D126 Benign neoplasm of colon, unspecified: Secondary | ICD-10-CM

## 2011-04-28 LAB — BUN: BUN: 22 mg/dL (ref 6–23)

## 2011-04-28 LAB — CREATININE, SERUM: Creatinine, Ser: 1.3 mg/dL (ref 0.4–1.5)

## 2011-04-28 NOTE — Patient Instructions (Signed)
You have been scheduled for a MRI of the liver at [facility] on 05/17/11 10 am arrive 945 am  Nothing to eat or drink after 6 am. Please have labs done today at our basement lab.

## 2011-04-28 NOTE — Progress Notes (Addendum)
Subjective:    Patient ID: Stephen [name], male    DOB: 1946/06/29, 64 y.o.   MRN: [id number]  HPI Stephen [name] is a 45 yo man with a PMH of CAD status post PCI, diabetes, chronic renal insufficiency, pulmonary fibrosis, hypertension, adrenal adenoma who seen in consultation at the request of Dr. [name] for evaluation of incidental liver lesion seen by CT scan.  Patient reports that he had a CT scan of the summer of 2012 done for the possibility of kidney stones and this showed a lesion in his liver. He denies a previous history of liver problems and says he's never been known to have elevated liver enzymes. He denies a history of jaundice, ascites, itching, GI bleeding, or confusion. He denies abdominal pain today. He does state he has some nausea, but no vomiting. Eating does not seem to affect his nausea. He has not lost weight and he denies early satiety. His appetite remains "good". He reports normal bowel movements with no bright red blood per rectum or melena. He states he had a colonoscopy approximately 5 years ago in [facility]. He was told one polyp was removed and he should followup in 5 years.  No fevers chills or night sweats. He denies heartburn, dysphagia and odynophagia.   Review of Systems Constitutional: Negative for fever, chills, night sweats, activity change, appetite change and unexpected weight change HEENT: Negative for sore throat, mouth sores and trouble swallowing. Eyes: Negative for visual disturbance Respiratory: Negative for cough, chest tightness, positive for shortness of breath Cardiovascular: Negative for chest pain, palpitations and lower extremity swelling Gastrointestinal: See history of present illness Genitourinary: Negative for dysuria and hematuria. Musculoskeletal: Positive for chronic back pain and arthritis Skin: Negative for rash or color change Neurological: Positive for headaches, negative for weakness, numbness Hematological:  Negative for adenopathy, negative for easy bruising/bleeding Psychiatric/behavioral: Negative for depressed mood, negative for anxiety   Patient Active Problem List  Diagnoses  . DIABETES MELLITUS, TYPE II, CONTROLLED, W/NEURO COMPS  . METABOLIC SYNDROME X  . SLEEP APNEA, OBSTRUCTIVE  . CAD, NATIVE VESSEL  . RENAL INSUFFICIENCY, CHRONIC  . PULMONARY FIBROSIS  . Essential hypertension, benign  . Back pain  . Kidney stones  . Abnormal CT scan  . Adrenal adenoma  . HLD (hyperlipidemia)   Current Outpatient Prescriptions  Medication Sig Dispense Refill  . amitriptyline (ELAVIL) 50 MG tablet Take 50 mg by mouth at bedtime.       Marland Kitchen aspirin 81 MG tablet Take 81 mg by mouth 2 (two) times daily.        Marland Kitchen atorvastatin (LIPITOR) 80 MG tablet Take 80 mg by mouth at bedtime.        . carvedilol (COREG) 25 MG tablet Take 1 tablet (25 mg total) by mouth 2 (two) times daily with a meal.  180 tablet  2  . cloNIDine (CATAPRES) 0.2 MG tablet Take 0.2 mg by mouth 2 (two) times daily.        . clopidogrel (PLAVIX) 75 MG tablet Take 1 tablet (75 mg total) by mouth daily.  90 tablet  3  . famotidine (PEPCID) 20 MG tablet Take 1 tablet (20 mg total) by mouth daily.  90 tablet  3  . finasteride (PROSCAR) 5 MG tablet Once daily      . furosemide (LASIX) 80 MG tablet Take 80 mg by mouth every other day.        . gabapentin (NEURONTIN) 800 MG tablet 1 tablet in  the morning and 1 and 1/2 at bedtime       . insulin aspart (NOVOLOG) 100 UNIT/ML injection Inject 50 Units into the skin 3 (three) times daily before meals. Use an extra 5 units with large meals  20 mL  11  . insulin NPH (NOVOLIN N) 100 UNIT/ML injection Inject 10 units with breakfast.  Call back with sugar readings in 1 week.  10 mL  12  . isosorbide mononitrate (IMDUR) 60 MG 24 hr tablet Take 1 tablet (60 mg total) by mouth daily.  90 tablet  2  . lisinopril (PRINIVIL,ZESTRIL) 40 MG tablet Take 1 tablet (40 mg total) by mouth daily.  30 tablet  6  .  nitroGLYCERIN (NITROSTAT) 0.4 MG SL tablet Place 0.4 mg under the tongue every 5 (five) minutes as needed. May repeat x3        . Tamsulosin HCl (FLOMAX) 0.4 MG CAPS Once daily      . traMADol (ULTRAM) 50 MG tablet Take 50 mg by mouth daily.         Allergies  Allergen Reactions  . Calcium Channel Blockers     Would avoid due to h/o peripheral edema    Social History  . Marital Status: Married    Number of Children: 2   Occupational History  . 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     Social History Main Topics  . Smoking status: Never Smoker   . Smokeless tobacco: Never Used  . Alcohol Use: No  . Drug Use: No   Family History  Problem Relation Age of Onset  . Heart attack Father   . Heart attack Mother   . Arthritis Maternal Grandmother   . Breast cancer Maternal Aunt   . Diabetes Maternal Aunt   . Hypertension Other   . Heart attack Brother   . Hypertension Maternal Aunt       Objective:   Physical Exam BP 150/84  Pulse 80  Ht 5' 5.5" (1.664 m)  Wt 267 lb 3.2 oz (121.201 kg)  BMI 43.79 kg/m2 Constitutional: Well-developed and well-nourished. No distress. HEENT: Normocephalic and atraumatic. Oropharynx is clear and moist. No oropharyngeal exudate. Conjunctivae are normal. Pupils are equal round and reactive to light. No scleral icterus. Oxygen via nasal cannula Neck: Neck supple. Trachea midline. Cardiovascular: Normal rate, regular rhythm and intact distal pulses. No M/R/G Pulmonary/chest: Effort normal and breath sounds normal. Bilateral fine crackles, no wheezing or rhonchi  Abdominal: Soft, nontender, nondistended. Bowel sounds active throughout. There are no masses palpable. No hepatosplenomegaly. Obese, right upper quadrant well-healed scar noted Extremities: 1+ lower extremity edema Lymphadenopathy: No cervical adenopathy noted. Neurological: Alert and oriented to  person place and time. Skin: Skin is warm and dry. No rashes noted. Psychiatric: Normal mood and affect. Behavior is normal.  CT scan June 2012 -- regarding liver imaging only Tiny low attenuation lesion in the right hepatic lobe too small to characterize. Punctate calcification in the liver likely sequela of old infection. The spleen pancreas and gallbladder are unremarkable.  CMP     Component Value Date/Time   NA 142 04/20/2011 0833   K 4.1 04/20/2011 0833   CL 108 04/20/2011 0833   CO2 29 04/20/2011 0833   GLUCOSE 128* 04/20/2011 0833   BUN 19 04/20/2011 0833   CREATININE 1.1 04/20/2011 0833   CALCIUM 8.7 04/20/2011 0833   PROT 6.7 04/20/2011 [id number]  ALBUMIN 3.2* 04/20/2011 0833   AST 19 04/20/2011 0833   ALT 26 04/20/2011 0833   ALKPHOS 82 04/20/2011 0833   BILITOT 0.6 04/20/2011 0833    CBC    Component Value Date/Time   HGB 14.3 08/31/2010 1529   HCT 42.0 08/31/2010 1529       Assessment & Plan:  65 yo man with a PMH of CAD status post PCI, diabetes, chronic renal insufficiency, pulmonary fibrosis, hypertension, adrenal adenoma who seen in consultation at the request of Dr. [name] for evaluation of incidental liver lesion seen by CT scan  1. Liver lesion -- the liver lesion seen by CT was very small. There was some evidence of prior infection with calcium deposition in the liver. The patient overall has no history of chronic liver disease and has no evidence for decompensated liver disease at present. His liver enzymes were very recently normal. At this point, I recommended MRI abdomen, hepatic protocol to reevaluate the liver lesion and to insure no increase in size. If this lesion is seen and stable then nothing further is felt necessary. Further recommendations regarding this lesion will be based on imaging results.  2. Colon polyps -- the patient has a history of colon polyps, though I do not know the type of polyp removed in [place] about 5 years ago. We have  requested a copy of this result and will base our recommendation regarding further surveillance/screening on the pathology results from 5 years ago.    Addendum: MRI abdomen with and without contrast, 05/23/2011 - followup previously seen liver lesion Findings: The area of concern laterally in the right lobe of the liver on the CT appears to represent a nonenhancing cystic structure consistent with a simple cyst measuring approximately 10 mm anterior to posterior. Impression: 1. Probable hepatic cyst.  2. Left adrenal adenoma  --Based on these imaging findings, the liver lesion is likely a simple cyst requiring no further action or followup unless symptoms warrant. Still awaiting records of prior colonoscopy

## 2011-05-10 ENCOUNTER — Encounter: Payer: Self-pay | Admitting: Cardiovascular Disease

## 2011-05-12 ENCOUNTER — Ambulatory Visit (INDEPENDENT_AMBULATORY_CARE_PROVIDER_SITE_OTHER): Payer: Medicare PPO | Admitting: Cardiovascular Disease

## 2011-05-12 ENCOUNTER — Encounter: Payer: Self-pay | Admitting: Cardiovascular Disease

## 2011-05-12 DIAGNOSIS — R079 Chest pain, unspecified: Secondary | ICD-10-CM

## 2011-05-12 DIAGNOSIS — E1149 Type 2 diabetes mellitus with other diabetic neurological complication: Secondary | ICD-10-CM

## 2011-05-12 DIAGNOSIS — E785 Hyperlipidemia, unspecified: Secondary | ICD-10-CM

## 2011-05-12 DIAGNOSIS — I2510 Atherosclerotic heart disease of native coronary artery without angina pectoris: Secondary | ICD-10-CM

## 2011-05-12 DIAGNOSIS — I10 Essential (primary) hypertension: Secondary | ICD-10-CM

## 2011-05-12 MED ORDER — CLONIDINE HCL 0.3 MG PO TABS: 0.3000 mg | ORAL_TABLET | Freq: Two times a day (BID) | ORAL | Status: DC

## 2011-05-12 MED ORDER — ISOSORBIDE MONONITRATE ER 60 MG PO TB24: 60.0000 mg | ORAL_TABLET | Freq: Two times a day (BID) | ORAL | Status: DC

## 2011-05-12 NOTE — Patient Instructions (Addendum)
  Please start omeprazole two pills a day for acid We will schedule a renal ultrasound for malignant HTN Please increase the clonidine to 0.3 twice a day (1 1/2 pills twice a day of th 0.2 mg)  Increase the isosorbide to twice a day Continue to monitor your blood pressure  Please call us if you have new issues that need to be addressed before your next appt.   Your physician has requested that you have a renal artery duplex. During this test, an ultrasound is used to evaluate blood flow to the kidneys. Allow one hour for this exam. Do not eat after midnight the day before and avoid carbonated beverages. Take your medications as you usually do.  The office will contact you for a follow up Appt. In 1 months

## 2011-05-14 NOTE — Assessment & Plan Note (Signed)
Currently with no symptoms of angina. No further workup at this time. Continue current medication regimen. His dull chest pain episodes are somewhat atypical. He has had chronic chest pain symptoms in the past.

## 2011-05-14 NOTE — Progress Notes (Signed)
Patient ID: Stephen [name], male    DOB: Sep 01, 1945, 65 y.o.   MRN: [id number]  HPI Comments: Stephen [name] is a very pleasant 65 year old gentleman with a history of coronary artery disease, PCI to the mid RCA and mid left circumflex in October 2007, interstitial fibrosis, with wedge resection, hypertension, hyperlipidemia, remote tobacco abuse, obesity, chronic substernal chest pain who presents for routine followup.   He reports that his blood pressure has been significantly elevated recently. Systolic pressures typically in the 200 range over the past week. He has been having episodes of chest pain over the past 2 weeks, typically at rest, described as dull, lasting less than one minute at a time. He continues to have chronic edema that is relatively unchanged. He is having difficulty working on his weight.    Cardiac catheterization in July 2008 shows 10% proximal LAD disease, otherwise no significant stenoses. Stent to the mid RCA placed August 2007 was patent, stent with DES to the mid left circumflex in October 2007 also patent.   Open lung biopsy was performed November 2009 for bilateral pulmonary infiltrates.   Stress test July 2011 was dobutamine stress that showed no significant ischemia, inadequate heart rate was achieved.   right and left heart catheterization January 2011 showed 50% mid LAD disease, 60% at the ostium of the diagonal #2, 30% proximal left circumflex disease, patent stent in the left circumflex, patent stent of the mid RCA, wedge pressure of 16, PA pressure mean 22, right ventricular pressure 41/8 .   echocardiogram September 2010 shows normal systolic function, diastolic dysfunction, normal RV function unable to evaluate right ventricular systolic pressures   EKG shows normal sinus rhythm with rate 63 beats per minute with  left axis deviation, T Wave abnormality in one and aVL      Outpatient Encounter Prescriptions as of 05/12/2011  Medication Sig Dispense  Refill  . amitriptyline (ELAVIL) 50 MG tablet Take 50 mg by mouth at bedtime.       Marland Kitchen aspirin 81 MG tablet Take 81 mg by mouth 2 (two) times daily.        Marland Kitchen atorvastatin (LIPITOR) 80 MG tablet Take 80 mg by mouth at bedtime.        . carvedilol (COREG) 25 MG tablet Take 1 tablet (25 mg total) by mouth 2 (two) times daily with a meal.  180 tablet  2  . clopidogrel (PLAVIX) 75 MG tablet Take 1 tablet (75 mg total) by mouth daily.  90 tablet  3  . famotidine (PEPCID) 20 MG tablet Take 1 tablet (20 mg total) by mouth daily.  90 tablet  3  . finasteride (PROSCAR) 5 MG tablet Once daily      . furosemide (LASIX) 80 MG tablet Take 80 mg by mouth every other day.        . gabapentin (NEURONTIN) 800 MG tablet 1 tablet in the morning and 1 and 1/2 at bedtime       . insulin aspart (NOVOLOG) 100 UNIT/ML injection Inject 50 Units into the skin 3 (three) times daily before meals. Use an extra 5 units with large meals  20 mL  11  . insulin NPH (NOVOLIN N) 100 UNIT/ML injection Inject 10 units with breakfast.  Call back with sugar readings in 1 week.  10 mL  12  . lisinopril (PRINIVIL,ZESTRIL) 40 MG tablet Take 1 tablet (40 mg total) by mouth daily.  30 tablet  6  . nitroGLYCERIN (NITROSTAT) 0.4 MG SL tablet  Place 0.4 mg under the tongue every 5 (five) minutes as needed. May repeat x3        . Tamsulosin HCl (FLOMAX) 0.4 MG CAPS Once daily      . traMADol (ULTRAM) 50 MG tablet Take 50 mg by mouth daily.        .  cloNIDine (CATAPRES) 0.2 MG tablet Take 0.2 mg by mouth 2 (two) times daily.        .  isosorbide mononitrate (IMDUR) 60 MG 24 hr tablet Take 1 tablet (60 mg total) by mouth daily.  90 tablet  2     Review of Systems  Constitutional: Negative.   HENT: Negative.   Eyes: Negative.   Respiratory: Positive for shortness of breath.   Cardiovascular: Positive for chest pain and leg swelling.  Gastrointestinal: Negative.   Musculoskeletal: Negative.   Skin: Negative.   Neurological: Negative.     Hematological: Negative.   Psychiatric/Behavioral: Negative.   All other systems reviewed and are negative.   BP 160/70  Pulse 63  Ht 5\' 5"  (1.651 m)  Wt 269 lb (122.018 kg)  BMI 44.76 kg/m2 Recheck of the blood pressure by myself shows systolic pressure in the [id number] range.  Physical Exam  Nursing note and vitals reviewed. Constitutional: He is oriented to person, place, and time. He appears well-developed and well-nourished.       Morbidly obese.  HENT:  Head: Normocephalic.  Nose: Nose normal.  Mouth/Throat: Oropharynx is clear and moist.  Eyes: Conjunctivae are normal. Pupils are equal, round, and reactive to light.  Neck: Normal range of motion. Neck supple. No JVD present.  Cardiovascular: Normal rate, regular rhythm, S1 normal, S2 normal, normal heart sounds and intact distal pulses.  Exam reveals no gallop and no friction rub.   No murmur heard. Pulmonary/Chest: Effort normal and breath sounds normal. No respiratory distress. He has no wheezes. He has no rales. He exhibits no tenderness.  Abdominal: Soft. Bowel sounds are normal. He exhibits no distension. There is no tenderness.  Musculoskeletal: Normal range of motion. He exhibits edema. He exhibits no tenderness.  Lymphadenopathy:    He has no cervical adenopathy.  Neurological: He is alert and oriented to person, place, and time. Coordination normal.  Skin: Skin is warm and dry. No rash noted. No erythema.  Psychiatric: He has a normal mood and affect. His behavior is normal. Judgment and thought content normal.           Assessment and Plan

## 2011-05-14 NOTE — Assessment & Plan Note (Signed)
LDL above goal. We have asked him to watch his weight and diet in an effort to improve his cholesterol numbers. We could consider adding zetia 10 mg daily.

## 2011-05-14 NOTE — Assessment & Plan Note (Signed)
We have encouraged continued exercise, careful diet management in an effort to lose weight. 

## 2011-05-14 NOTE — Assessment & Plan Note (Addendum)
Uncertain why his blood pressure measurements at home are drastically higher than our measurements in the office. We have instructed him on how to measure his blood pressure and asked him to bring in her blood pressure cuff for comparison with a manual measurement. We will increase his clonidine to 0.3 mg b.i.d. And increase his isosorbide to b.i.d..  I last him to continue to monitor his blood pressure closely and call our office over the next week or so with new numbers. We will also order a renal artery ultrasound to rule out renal artery stenosis as a cause of his hypertension.

## 2011-05-17 ENCOUNTER — Other Ambulatory Visit: Payer: Self-pay | Admitting: Internal Medicine

## 2011-05-17 ENCOUNTER — Ambulatory Visit (HOSPITAL_COMMUNITY)
Admission: RE | Admit: 2011-05-17 | Discharge: 2011-05-17 | Disposition: A | Discharge status: Home / Self Care | Payer: Medicare PPO | Source: Ambulatory Visit | Attending: Internal Medicine | Admitting: Internal Medicine

## 2011-05-17 ENCOUNTER — Telehealth: Payer: Self-pay | Admitting: *Deleted

## 2011-05-17 DIAGNOSIS — K769 Liver disease, unspecified: Secondary | ICD-10-CM

## 2011-05-17 DIAGNOSIS — Z5309 Procedure and treatment not carried out because of other contraindication: Secondary | ICD-10-CM | POA: Insufficient documentation

## 2011-05-17 DIAGNOSIS — K7689 Other specified diseases of liver: Secondary | ICD-10-CM | POA: Insufficient documentation

## 2011-05-17 NOTE — Telephone Encounter (Signed)
Wells Guiles at [facility], called to report, d/t pt's ascites, he won't fit into their MRI. Please r/s. Informed Dr Hilarie Fredrickson and we will r/s to an open MRI.

## 2011-05-18 ENCOUNTER — Telehealth: Payer: Self-pay | Admitting: Emergency Medicine

## 2011-05-18 NOTE — Telephone Encounter (Signed)
ATC pt NA. Unable to leave VM due to mailbox is full

## 2011-05-18 NOTE — Telephone Encounter (Signed)
Spoke with Inez Catalina at [facility] and the [place] large bore MRI. 60cm or 2.3 ft in diameter. Call [phone] to schedule. Called Wells Guiles ar [facility] who will call back with their measurements.

## 2011-05-18 NOTE — Telephone Encounter (Signed)
ATC NAa unable to leave VM wcb

## 2011-05-18 NOTE — Telephone Encounter (Signed)
Notified pt he has an appt for MRI at [facility] on 05/23/11 at 11:15am, be there at 10:45; [place]. Pt stated understanding. Faxed order for MRI/abdomen with BUN and Creatinine results to: [phone].

## 2011-05-19 NOTE — Telephone Encounter (Signed)
ATC NA mailbox is full [facility]

## 2011-05-19 NOTE — Telephone Encounter (Signed)
ATC NA mailbox is full [facility]

## 2011-05-22 NOTE — Telephone Encounter (Signed)
Pt states he has "metal dust" in his lungs. Is scheduled for MRI Liver tomorrow at 10am in [place] at the [facility]. Wants to know if this is okay to have.

## 2011-05-22 NOTE — Telephone Encounter (Signed)
He is OK to have the MRI - he has scarring from metal dust exposure, but not significant metal accumulation (that would preclude an MRI). Thanks.

## 2011-05-22 NOTE — Telephone Encounter (Signed)
I spoke with pt and is aware okay to have MRI done.

## 2011-05-23 ENCOUNTER — Ambulatory Visit: Payer: Self-pay

## 2011-05-26 ENCOUNTER — Encounter (INDEPENDENT_AMBULATORY_CARE_PROVIDER_SITE_OTHER): Payer: Medicare PPO | Admitting: *Deleted

## 2011-05-26 DIAGNOSIS — I10 Essential (primary) hypertension: Secondary | ICD-10-CM

## 2011-06-01 ENCOUNTER — Telehealth: Payer: Self-pay | Admitting: *Deleted

## 2011-06-01 NOTE — Telephone Encounter (Signed)
Please let Stephen [name] know that his MRI abdomen showed that the lesion in his liver previously seen by CT is stable, and felt to represent a simple cyst. These are benign and require no followup. Overall this is good news. He still has the adrenal adenoma, which is known to him and seen by MRI.  We are still awaiting the records his prior colonoscopy.  Thanks  Informed pt of Dr Vena Rua findings and recommedations; pt stated understanding.

## 2011-06-06 ENCOUNTER — Encounter: Payer: Self-pay | Admitting: Cardiovascular Disease

## 2011-06-13 ENCOUNTER — Ambulatory Visit (INDEPENDENT_AMBULATORY_CARE_PROVIDER_SITE_OTHER): Payer: Medicare PPO | Admitting: Cardiovascular Disease

## 2011-06-13 ENCOUNTER — Other Ambulatory Visit: Payer: Self-pay | Admitting: Cardiovascular Disease

## 2011-06-13 ENCOUNTER — Encounter: Payer: Self-pay | Admitting: Cardiovascular Disease

## 2011-06-13 DIAGNOSIS — I10 Essential (primary) hypertension: Secondary | ICD-10-CM

## 2011-06-13 DIAGNOSIS — E785 Hyperlipidemia, unspecified: Secondary | ICD-10-CM

## 2011-06-13 DIAGNOSIS — I2510 Atherosclerotic heart disease of native coronary artery without angina pectoris: Secondary | ICD-10-CM

## 2011-06-13 DIAGNOSIS — E1149 Type 2 diabetes mellitus with other diabetic neurological complication: Secondary | ICD-10-CM

## 2011-06-13 MED ORDER — AMLODIPINE BESYLATE 10 MG PO TABS: 10.0000 mg | ORAL_TABLET | Freq: Every day | ORAL | Status: DC

## 2011-06-13 NOTE — Patient Instructions (Addendum)
You are doing well. Please start amlodipine one pill at night If you have worsening swelling, call the office For severe swelling swelling, cut the amlodipine in 1/2  Please call us if you have new issues that need to be addressed before your next appt.  The office will contact you for a follow up Appt. In 2 months

## 2011-06-13 NOTE — Progress Notes (Signed)
Patient ID: Stephen [name], male    DOB: 03/11/1946, 65 y.o.   MRN: [id number]  HPI Comments: Mr. Weidert is a very pleasant 65 year old gentleman with a history of coronary artery disease, PCI to the mid RCA and mid left circumflex in October 2007, interstitial fibrosis, with wedge resection, hypertension, hyperlipidemia, remote tobacco abuse, obesity, chronic substernal chest pain who presents for routine followup.    blood pressure has been significantly elevated recently. He continues to have mild chronic edema that is relatively unchanged. He is having difficulty working on his weight.  Blood pressure at home is typically in the 0000000 range systolic.   Open lung biopsy was performed November 2009 for bilateral pulmonary infiltrates.   Stress test July 2011 was dobutamine stress that showed no significant ischemia, inadequate heart rate was achieved.   right and left heart catheterization January 2011 showed 50% mid LAD disease, 60% at the ostium of the diagonal #2, 30% proximal left circumflex disease, patent stent in the left circumflex, patent stent of the mid RCA, wedge pressure of 16, PA pressure mean 22, right ventricular pressure 41/8 .   echocardiogram September 2010 shows normal systolic function, diastolic dysfunction, normal RV function unable to evaluate right ventricular systolic pressures     Outpatient Encounter Prescriptions as of 06/13/2011  Medication Sig Dispense Refill  . amitriptyline (ELAVIL) 50 MG tablet Take 50 mg by mouth at bedtime.       Marland Kitchen aspirin 81 MG tablet Take 81 mg by mouth 2 (two) times daily.        Marland Kitchen atorvastatin (LIPITOR) 80 MG tablet Take 80 mg by mouth at bedtime.        . carvedilol (COREG) 25 MG tablet Take 1 tablet (25 mg total) by mouth 2 (two) times daily with a meal.  180 tablet  2  . cloNIDine (CATAPRES) 0.3 MG tablet Take 1 tablet (0.3 mg total) by mouth 2 (two) times daily.  180 tablet  4  . clopidogrel (PLAVIX) 75 MG tablet Take 1  tablet (75 mg total) by mouth daily.  90 tablet  3  . finasteride (PROSCAR) 5 MG tablet Once daily      . gabapentin (NEURONTIN) 800 MG tablet 1 tablet in the morning and 1 and 1/2 at bedtime       . insulin aspart (NOVOLOG) 100 UNIT/ML injection Inject 50 Units into the skin 3 (three) times daily before meals. Use an extra 5 units with large meals  20 mL  11  . insulin NPH (NOVOLIN N) 100 UNIT/ML injection Inject 10 units with breakfast.  Call back with sugar readings in 1 week.  10 mL  12  . isosorbide mononitrate (IMDUR) 60 MG 24 hr tablet Take 1 tablet (60 mg total) by mouth 2 (two) times daily.  180 tablet  2  . lisinopril (PRINIVIL,ZESTRIL) 40 MG tablet Take 1 tablet (40 mg total) by mouth daily.  30 tablet  6  . nitroGLYCERIN (NITROSTAT) 0.4 MG SL tablet Place 0.4 mg under the tongue every 5 (five) minutes as needed. May repeat x3        . omeprazole (PRILOSEC) 20 MG capsule Take 20 mg by mouth daily.        . Tamsulosin HCl (FLOMAX) 0.4 MG CAPS Once daily      . traMADol (ULTRAM) 50 MG tablet Take 50 mg by mouth daily.        . furosemide (LASIX) 80 MG tablet Take 80 mg by mouth  every other day.        Marland Kitchen DISCONTD: famotidine (PEPCID) 20 MG tablet Take 1 tablet (20 mg total) by mouth daily.  90 tablet  3     Review of Systems  Constitutional: Negative.   HENT: Negative.   Eyes: Negative.   Respiratory: Positive for shortness of breath.   Cardiovascular: Positive for leg swelling.  Gastrointestinal: Negative.   Musculoskeletal: Negative.   Skin: Negative.   Neurological: Negative.   Hematological: Negative.   Psychiatric/Behavioral: Negative.   All other systems reviewed and are negative.   BP 170/80  Pulse 59  Ht 5\' 5"  (1.651 m)  Wt 272 lb 4 oz (123.492 kg)  BMI 45.30 kg/m2  SpO2 96%   Physical Exam  Nursing note and vitals reviewed. Constitutional: He is oriented to person, place, and time. He appears well-developed and well-nourished.       Morbidly obese.  HENT:    Head: Normocephalic.  Nose: Nose normal.  Mouth/Throat: Oropharynx is clear and moist.  Eyes: Conjunctivae are normal. Pupils are equal, round, and reactive to light.  Neck: Normal range of motion. Neck supple. No JVD present.  Cardiovascular: Normal rate, regular rhythm, S1 normal, S2 normal, normal heart sounds and intact distal pulses.  Exam reveals no gallop and no friction rub.   No murmur heard.      Trace edema, predominantly on the left LE  Pulmonary/Chest: Effort normal and breath sounds normal. No respiratory distress. He has no wheezes. He has no rales. He exhibits no tenderness.  Abdominal: Soft. Bowel sounds are normal. He exhibits no distension. There is no tenderness.  Musculoskeletal: Normal range of motion. He exhibits edema. He exhibits no tenderness.  Lymphadenopathy:    He has no cervical adenopathy.  Neurological: He is alert and oriented to person, place, and time. Coordination normal.  Skin: Skin is warm and dry. No rash noted. No erythema.  Psychiatric: He has a normal mood and affect. His behavior is normal. Judgment and thought content normal.           Assessment and Plan

## 2011-06-13 NOTE — Assessment & Plan Note (Signed)
We have encouraged continued exercise, careful diet management in an effort to lose weight. We did mention that he might be a candidate for laparoscopic gastric banding.

## 2011-06-13 NOTE — Assessment & Plan Note (Addendum)
Blood pressure continues to be elevated. We will start amlodipine 10 mg daily. If he has significant and worsening lower extremity edema, we will try 5 mg. If unable to tolerate a calcium channel blocker, we could try hydralazine. Could potentially even try HCTZ.  Unable to visualize his renal arteries on ultrasound secondary to body habitus. Aorta was patent with no significant aneurysm. Kidney size was normal and symmetrical.

## 2011-06-13 NOTE — Assessment & Plan Note (Signed)
Currently with no symptoms of angina. No further workup at this time. Continue current medication regimen. 

## 2011-06-13 NOTE — Assessment & Plan Note (Signed)
Cholesterol is at goal on the current lipid regimen. No changes to the medications were made.  

## 2011-06-15 ENCOUNTER — Other Ambulatory Visit: Payer: Self-pay | Admitting: Cardiovascular Disease

## 2011-06-15 MED ORDER — LISINOPRIL 40 MG PO TABS: 40.0000 mg | ORAL_TABLET | Freq: Every day | ORAL | Status: DC

## 2011-06-15 NOTE — Telephone Encounter (Signed)
Refill sent for lisinopril  

## 2011-07-07 ENCOUNTER — Telehealth: Payer: Self-pay | Admitting: Family Medicine

## 2011-07-07 NOTE — Telephone Encounter (Signed)
Needs a note to dismiss him from [place] duty due to him being on oxygen and the tank only lasts 1 to 1 1/2 hrs.  Please call when ready.

## 2011-07-07 NOTE — Telephone Encounter (Signed)
Letter done, please give to pt.

## 2011-07-10 NOTE — Telephone Encounter (Signed)
Note left at front desk for pick up.

## 2011-07-24 ENCOUNTER — Other Ambulatory Visit (INDEPENDENT_AMBULATORY_CARE_PROVIDER_SITE_OTHER): Payer: Medicare PPO

## 2011-07-24 DIAGNOSIS — E119 Type 2 diabetes mellitus without complications: Secondary | ICD-10-CM

## 2011-07-24 LAB — HEMOGLOBIN A1C: Hgb A1c MFr Bld: 9.8 % — ABNORMAL HIGH (ref 4.6–6.5)

## 2011-07-27 ENCOUNTER — Encounter: Payer: Self-pay | Admitting: Family Medicine

## 2011-07-27 ENCOUNTER — Ambulatory Visit (INDEPENDENT_AMBULATORY_CARE_PROVIDER_SITE_OTHER): Payer: Medicare PPO | Admitting: Family Medicine

## 2011-07-27 VITALS — BP 144/66 | HR 62 | Temp 97.7°F | Wt 278.8 lb

## 2011-07-27 DIAGNOSIS — E1149 Type 2 diabetes mellitus with other diabetic neurological complication: Secondary | ICD-10-CM

## 2011-07-27 DIAGNOSIS — E119 Type 2 diabetes mellitus without complications: Secondary | ICD-10-CM

## 2011-07-27 DIAGNOSIS — E66813 Obesity, class 3: Secondary | ICD-10-CM | POA: Insufficient documentation

## 2011-07-27 MED ORDER — GABAPENTIN 800 MG PO TABS: ORAL_TABLET | ORAL | Status: DC

## 2011-07-27 MED ORDER — INSULIN ASPART 100 UNIT/ML ~~LOC~~ SOLN: 50.0000 [IU] | Freq: Three times a day (TID) | SUBCUTANEOUS | Status: DC

## 2011-07-27 MED ORDER — FUROSEMIDE 80 MG PO TABS: 80.0000 mg | ORAL_TABLET | ORAL | Status: DC

## 2011-07-27 NOTE — Progress Notes (Signed)
A1c 9.8. Sister has been in the hospital last month in [place] and his diet has been disrupted.   Using medications without difficulties:yes, can afford NPH more easily than levemir.   Hypoglycemic episodes: occ, if prolonged fasting. Less nocturnal hypoglycemia now.   Hyperglycemic episodes:yes Feet problems: no other than dec in sensation Blood Sugars averaging: 115-130s in AM  PMH and SH reviewed  Meds, vitals, and allergies reviewed.   ROS: See HPI.  Otherwise negative.    GEN: nad, alert and oriented, obese, on O2 HEENT: mucous membranes moist NECK: supple w/o LA CV: rrr. PULM: ctab, no inc wob, exam limited by habitus ABD: soft, +bs, obese EXT: no edema SKIN: no acute rash  Diabetic foot exam:  Normal inspection  No skin breakdown  No calluses  Normal DP pulses  Dec sensation to light tough and monofilament

## 2011-07-27 NOTE — Assessment & Plan Note (Addendum)
Inc NPH in AM, pt to work on diet, d/w pt. Will recheck A1c in 3 months.  He agrees.  >25 min spent with face to face with patient, >50% counseling and/or coordinating care.

## 2011-07-27 NOTE — Assessment & Plan Note (Signed)
D/w pt about diet and weight loss.

## 2011-07-27 NOTE — Patient Instructions (Signed)
Try to stick to your diabetes diet and increase the NPH to 25 units in the morning. Recheck sugar in another 3 months before a visit.  Take care.

## 2011-07-28 ENCOUNTER — Other Ambulatory Visit: Payer: Self-pay | Admitting: Family Medicine

## 2011-07-28 MED ORDER — INSULIN ASPART 100 UNIT/ML ~~LOC~~ SOLN: 50.0000 [IU] | Freq: Three times a day (TID) | SUBCUTANEOUS | Status: DC

## 2011-07-28 NOTE — Telephone Encounter (Signed)
Patient said he got all his meds except insulin and needs refill for 15 bottles of Novalog sent to [facility] in [place].

## 2011-07-28 NOTE — Telephone Encounter (Signed)
Sent!

## 2011-08-14 ENCOUNTER — Ambulatory Visit (INDEPENDENT_AMBULATORY_CARE_PROVIDER_SITE_OTHER): Payer: Medicare PPO | Admitting: Cardiovascular Disease

## 2011-08-14 ENCOUNTER — Encounter: Payer: Self-pay | Admitting: Cardiovascular Disease

## 2011-08-14 ENCOUNTER — Ambulatory Visit: Payer: Medicare PPO | Admitting: Cardiovascular Disease

## 2011-08-14 DIAGNOSIS — E785 Hyperlipidemia, unspecified: Secondary | ICD-10-CM

## 2011-08-14 DIAGNOSIS — I10 Essential (primary) hypertension: Secondary | ICD-10-CM

## 2011-08-14 DIAGNOSIS — E1142 Type 2 diabetes mellitus with diabetic polyneuropathy: Secondary | ICD-10-CM

## 2011-08-14 DIAGNOSIS — E1149 Type 2 diabetes mellitus with other diabetic neurological complication: Secondary | ICD-10-CM

## 2011-08-14 DIAGNOSIS — I2510 Atherosclerotic heart disease of native coronary artery without angina pectoris: Secondary | ICD-10-CM

## 2011-08-14 MED ORDER — TRIAMTERENE-HCTZ 37.5-25 MG PO TABS: 1.0000 | ORAL_TABLET | Freq: Every day | ORAL | Status: DC

## 2011-08-14 NOTE — Assessment & Plan Note (Signed)
We have encouraged continued exercise, careful diet management in an effort to lose weight. 

## 2011-08-14 NOTE — Assessment & Plan Note (Addendum)
Blood pressure continues to be elevated. Unable to clearly visualize his renal arteries by ultrasound. We will add triamterene HCTZ 37.5/0.5 mg daily. I've asked him to closely monitor his blood pressure. We will need to closely monitor her his renal function given history of chronic kidney disease.

## 2011-08-14 NOTE — Patient Instructions (Signed)
You are doing well. Please start triamterene-HCTZ once a day Continue to monitor your blood pressure  Please call us if you have new issues that need to be addressed before your next appt.  Your physician wants you to follow-up in: 1 months.

## 2011-08-14 NOTE — Progress Notes (Signed)
Patient ID: Stephen [name], male    DOB: 01/14/1946, 66 y.o.   MRN: [id number]  HPI Comments: Stephen [name] is a very pleasant 66 year old gentleman with a history of coronary artery disease, PCI to the mid RCA and mid left circumflex in October 2007, interstitial fibrosis, with wedge resection, hypertension, hyperlipidemia, remote tobacco abuse, obesity, chronic substernal chest pain who presents for routine followup.    blood pressure Continue to be elevated recently. He continues to have mild chronic edema that is relatively unchanged. He is having difficulty working on his weight.  Blood pressure at home is typically in the 150-160, Slightly improved after starting amlodipine 10 mg daily    Open lung biopsy was performed November 2009 for bilateral pulmonary infiltrates.   Stress test July 2011 was dobutamine stress that showed no significant ischemia, inadequate heart rate was achieved.   right and left heart catheterization January 2011 showed 50% mid LAD disease, 60% at the ostium of the diagonal #2, 30% proximal left circumflex disease, patent stent in the left circumflex, patent stent of the mid RCA, wedge pressure of 16, PA pressure mean 22, right ventricular pressure 41/8 .   echocardiogram September 2010 shows normal systolic function, diastolic dysfunction, normal RV function unable to evaluate right ventricular systolic pressures    Outpatient Encounter Prescriptions as of 08/14/2011  Medication Sig Dispense Refill  . amitriptyline (ELAVIL) 50 MG tablet Take 50 mg by mouth at bedtime.       Marland Kitchen amLODipine (NORVASC) 10 MG tablet Take 1 tablet (10 mg total) by mouth daily.  30 tablet  11  . aspirin 81 MG tablet Take 81 mg by mouth 2 (two) times daily.        Marland Kitchen atorvastatin (LIPITOR) 80 MG tablet Take 80 mg by mouth at bedtime.        . carvedilol (COREG) 25 MG tablet Take 1 tablet (25 mg total) by mouth 2 (two) times daily with a meal.  180 tablet  2  . cloNIDine (CATAPRES) 0.3 MG  tablet Take 1 tablet (0.3 mg total) by mouth 2 (two) times daily.  180 tablet  4  . clopidogrel (PLAVIX) 75 MG tablet Take 1 tablet (75 mg total) by mouth daily.  90 tablet  3  . finasteride (PROSCAR) 5 MG tablet Once daily      . furosemide (LASIX) 80 MG tablet Take 1 tablet (80 mg total) by mouth every other day.  45 tablet  3  . gabapentin (NEURONTIN) 800 MG tablet 1 tablet in the morning and 1 and 1/2 at bedtime  225 tablet  3  . insulin aspart (NOVOLOG) 100 UNIT/ML injection Inject 50 Units into the skin 3 (three) times daily before meals. Use an extra 5 units with large meals, dispense 15 vials  150 mL  3  . insulin NPH (HUMULIN N,NOVOLIN N) 100 UNIT/ML injection Inject 25 units with breakfast.  10 units at supper.      . isosorbide mononitrate (IMDUR) 60 MG 24 hr tablet Take 1 tablet (60 mg total) by mouth 2 (two) times daily.  180 tablet  2  . lisinopril (PRINIVIL,ZESTRIL) 40 MG tablet Take 1 tablet (40 mg total) by mouth daily.  30 tablet  6  . nitroGLYCERIN (NITROSTAT) 0.4 MG SL tablet Place 0.4 mg under the tongue every 5 (five) minutes as needed. May repeat x3        . omeprazole (PRILOSEC) 20 MG capsule Take 20 mg by mouth daily.        Marland Kitchen  Tamsulosin HCl (FLOMAX) 0.4 MG CAPS Once daily      . traMADol (ULTRAM) 50 MG tablet Take 50 mg by mouth daily.            Review of Systems  Constitutional: Negative.   HENT: Negative.   Eyes: Negative.   Respiratory: Positive for shortness of breath.   Cardiovascular: Positive for leg swelling.  Gastrointestinal: Negative.   Musculoskeletal: Negative.   Skin: Negative.   Neurological: Negative.   Hematological: Negative.   Psychiatric/Behavioral: Negative.   All other systems reviewed and are negative.   BP 172/72  Pulse 71  Ht 5\' 5"  (1.651 m)  Wt 281 lb (127.461 kg)  BMI 46.76 kg/m2  SpO2 96%   Physical Exam  Nursing note and vitals reviewed. Constitutional: He is oriented to person, place, and time. He appears  well-developed and well-nourished.       Morbidly obese.  HENT:  Head: Normocephalic.  Nose: Nose normal.  Mouth/Throat: Oropharynx is clear and moist.  Eyes: Conjunctivae are normal. Pupils are equal, round, and reactive to light.  Neck: Normal range of motion. Neck supple. No JVD present.  Cardiovascular: Normal rate, regular rhythm, S1 normal, S2 normal, normal heart sounds and intact distal pulses.  Exam reveals no gallop and no friction rub.   No murmur heard.      Trace edema, predominantly on the left LE  Pulmonary/Chest: Effort normal and breath sounds normal. No respiratory distress. He has no wheezes. He has no rales. He exhibits no tenderness.  Abdominal: Soft. Bowel sounds are normal. He exhibits no distension. There is no tenderness.  Musculoskeletal: Normal range of motion. He exhibits edema. He exhibits no tenderness.  Lymphadenopathy:    He has no cervical adenopathy.  Neurological: He is alert and oriented to person, place, and time. Coordination normal.  Skin: Skin is warm and dry. No rash noted. No erythema.  Psychiatric: He has a normal mood and affect. His behavior is normal. Judgment and thought content normal.           Assessment and Plan

## 2011-08-14 NOTE — Assessment & Plan Note (Signed)
Continue Lipitor, goal LDL less than 70

## 2011-08-14 NOTE — Assessment & Plan Note (Signed)
Currently with no symptoms of angina. No further workup at this time. Continue current medication regimen. 

## 2011-08-14 NOTE — Assessment & Plan Note (Signed)
Managed by Dr. [name], encouraged strict diet.

## 2011-09-05 ENCOUNTER — Ambulatory Visit (INDEPENDENT_AMBULATORY_CARE_PROVIDER_SITE_OTHER): Payer: Medicare PPO | Admitting: Family Medicine

## 2011-09-05 ENCOUNTER — Encounter: Payer: Self-pay | Admitting: Family Medicine

## 2011-09-05 DIAGNOSIS — M549 Dorsalgia, unspecified: Secondary | ICD-10-CM

## 2011-09-05 MED ORDER — HYDROCODONE-ACETAMINOPHEN 5-500 MG PO TABS: 1.0000 | ORAL_TABLET | Freq: Four times a day (QID) | ORAL | Status: AC | PRN

## 2011-09-05 NOTE — Progress Notes (Signed)
L sided back pain for 1 week.  Walked in this AM to clinic. Pain is worse over the last few days.  No R sided back pain.  No midline pain. No trauma, no falls.  He has some L leg pain, but it isn't radicular and isn't concurrent with back pain. No fevers.  Vomited yesterday from the pain, nonbloody.  No burning with urination.  No blood in urine.  Urine is clear per patient.  Sugar not checked last few days.  Eating less due to pain.   Taking ibuprofen for the pain w/o relief.  Tramadol doesn't help.  This doesn't feel like a kidney stone.  No blood in stool.    Meds, vitals, and allergies reviewed.   ROS: See HPI.  Otherwise, noncontributory.  nad but uncomfortable Morbidly obese On O2 Mmm rrr ctab Back w/o midline or R sided pain. L sided lower back paraspinal muscles are ttp near the SI joint. Able to stand, with pain.  Pain on flex and ext of back.  L SI tender on testing Motor exam w/o weakness in BLE.  abd soft, not ttp

## 2011-09-05 NOTE — Patient Instructions (Signed)
Take the vicodin for pain and use a heating pad.  This should gradually get better.  I would stop the ibuprofen.

## 2011-09-06 NOTE — Assessment & Plan Note (Signed)
With likely L SI joint irritation.  D/w pt about options.  Would use hydrocodone for pain and gentle stretching, rom, walking as tolerated.  He agrees.  Call back as needed.  Okay for outpatient f/u.

## 2011-09-11 ENCOUNTER — Encounter: Payer: Self-pay | Admitting: Cardiovascular Disease

## 2011-09-11 ENCOUNTER — Ambulatory Visit (INDEPENDENT_AMBULATORY_CARE_PROVIDER_SITE_OTHER): Payer: Medicare PPO | Admitting: Cardiovascular Disease

## 2011-09-11 DIAGNOSIS — E785 Hyperlipidemia, unspecified: Secondary | ICD-10-CM

## 2011-09-11 DIAGNOSIS — E1142 Type 2 diabetes mellitus with diabetic polyneuropathy: Secondary | ICD-10-CM

## 2011-09-11 DIAGNOSIS — E1149 Type 2 diabetes mellitus with other diabetic neurological complication: Secondary | ICD-10-CM

## 2011-09-11 DIAGNOSIS — I10 Essential (primary) hypertension: Secondary | ICD-10-CM

## 2011-09-11 DIAGNOSIS — I2510 Atherosclerotic heart disease of native coronary artery without angina pectoris: Secondary | ICD-10-CM

## 2011-09-11 MED ORDER — CLONIDINE HCL 0.3 MG/24HR TD PTWK: 2.0000 | MEDICATED_PATCH | TRANSDERMAL | Status: DC

## 2011-09-11 NOTE — Assessment & Plan Note (Signed)
Currently with no symptoms of angina. No further workup at this time. Continue current medication regimen. 

## 2011-09-11 NOTE — Assessment & Plan Note (Signed)
Blood pressure continues to be very elevated. We will change the oral clonidine 0.3 mg twice a day to clonidine patch 0.3 mg x2 weekly. We have suggested he continue his other medications and closely monitor his blood pressure.

## 2011-09-11 NOTE — Assessment & Plan Note (Signed)
Encouraged weight loss, strict diet.

## 2011-09-11 NOTE — Assessment & Plan Note (Signed)
Cholesterol is slightly above goal on his current medication regimen. We have encouraged weight loss. Continue Lipitor 80 mg daily

## 2011-09-11 NOTE — Patient Instructions (Addendum)
You are doing well. Stop the clonidine pill Start the clonidine patch. Increase the patch to two patches a week if you blood pressure stays high.  Please call us if you have new issues that need to be addressed before your next appt.  Your physician wants you to follow-up in: 2 months.  Feel free to call the office with blood pressure numbers or drop them off

## 2011-09-11 NOTE — Progress Notes (Signed)
Patient ID: Stephen [name], male    DOB: [birth year], 66 y.o.   MRN: [id number]  HPI Comments: Stephen [name] is a very pleasant 66 year old gentleman with a history of coronary artery disease, PCI to the mid RCA and mid left circumflex in October 2007, interstitial fibrosis, with wedge resection, hypertension, hyperlipidemia, remote tobacco abuse, obesity, chronic substernal chest pain who presents for routine followup.   Blood pressure continus to be elevated. He is having difficulty working on his weight.  Blood pressure at home is typically in the 150-190. Otherwise she feels well with no complaints. He continues to have mild edema  Open lung biopsy was performed November 2009 for bilateral pulmonary infiltrates. Stress test July 2011 was dobutamine stress that showed no significant ischemia, inadequate heart rate was achieved.   right and left heart catheterization January 2011 showed 50% mid LAD disease, 60% at the ostium of the diagonal #2, 30% proximal left circumflex disease, patent stent in the left circumflex, patent stent of the mid RCA, wedge pressure of 16, PA pressure mean 22, right ventricular pressure 41/8 .   echocardiogram September 2010 shows normal systolic function, diastolic dysfunction, normal RV function unable to evaluate right ventricular systolic pressures  EKG shows normal sinus rhythm with rate 55 beats per minute with poor R-wave progression to the anterior precordial leads, consider old anterior MI, left axis deviation    Outpatient Encounter Prescriptions as of 09/11/2011  Medication Sig Dispense Refill  . amitriptyline (ELAVIL) 50 MG tablet Take 50 mg by mouth at bedtime.       Marland Kitchen amLODipine (NORVASC) 10 MG tablet Take 1 tablet (10 mg total) by mouth daily.  30 tablet  11  . aspirin 81 MG tablet Take 81 mg by mouth 2 (two) times daily.        Marland Kitchen atorvastatin (LIPITOR) 80 MG tablet Take 80 mg by mouth at bedtime.        . carvedilol (COREG) 25 MG tablet Take 1  tablet (25 mg total) by mouth 2 (two) times daily with a meal.  180 tablet  2  . clopidogrel (PLAVIX) 75 MG tablet Take 1 tablet (75 mg total) by mouth daily.  90 tablet  3  . finasteride (PROSCAR) 5 MG tablet Once daily      . gabapentin (NEURONTIN) 800 MG tablet 1 tablet in the morning and 1 and 1/2 at bedtime  225 tablet  3  . HYDROcodone-acetaminophen (VICODIN) 5-500 MG per tablet Take 1 tablet by mouth every 6 (six) hours as needed for pain.  30 tablet  0  . insulin aspart (NOVOLOG) 100 UNIT/ML injection Inject 50 Units into the skin 3 (three) times daily before meals. Use an extra 5 units with large meals, dispense 15 vials  150 mL  3  . insulin NPH (HUMULIN N,NOVOLIN N) 100 UNIT/ML injection Inject 25 units with breakfast.  10 units at supper.      . isosorbide mononitrate (IMDUR) 60 MG 24 hr tablet Take 1 tablet (60 mg total) by mouth 2 (two) times daily.  180 tablet  2  . lisinopril (PRINIVIL,ZESTRIL) 40 MG tablet Take 1 tablet (40 mg total) by mouth daily.  30 tablet  6  . nitroGLYCERIN (NITROSTAT) 0.4 MG SL tablet Place 0.4 mg under the tongue every 5 (five) minutes as needed. May repeat x3        . omeprazole (PRILOSEC) 20 MG capsule Take 20 mg by mouth daily.        Marland Kitchen  Tamsulosin HCl (FLOMAX) 0.4 MG CAPS Once daily      . triamterene-hydrochlorothiazide (MAXZIDE-25) 37.5-25 MG per tablet Take 1 each (1 tablet total) by mouth daily.  30 tablet  11  . furosemide (LASIX) 80 MG tablet Take 1 tablet (80 mg total) by mouth every other day.  45 tablet  3  . cloNIDine (CATAPRES) 0.3 MG tablet Take 1 tablet (0.3 mg total) by mouth 2 (two) times daily.  180 tablet  4    Review of Systems  Constitutional: Negative.   HENT: Negative.   Eyes: Negative.   Respiratory: Positive for shortness of breath.   Cardiovascular: Positive for leg swelling.  Gastrointestinal: Negative.   Musculoskeletal: Negative.   Skin: Negative.   Neurological: Negative.   Hematological: Negative.     Psychiatric/Behavioral: Negative.   All other systems reviewed and are negative.   BP 148/70  Pulse 55  Ht 5' 5.5" (1.664 m)  Wt 278 lb 6.4 oz (126.281 kg)  BMI 45.62 kg/m2   Physical Exam  Nursing note and vitals reviewed. Constitutional: He is oriented to person, place, and time. He appears well-developed and well-nourished.       Morbidly obese.  HENT:  Head: Normocephalic.  Nose: Nose normal.  Mouth/Throat: Oropharynx is clear and moist.  Eyes: Conjunctivae are normal. Pupils are equal, round, and reactive to light.  Neck: Normal range of motion. Neck supple. No JVD present.  Cardiovascular: Normal rate, regular rhythm, S1 normal, S2 normal, normal heart sounds and intact distal pulses.  Exam reveals no gallop and no friction rub.   No murmur heard.      Trace edema, predominantly on the left LE  Pulmonary/Chest: Effort normal and breath sounds normal. No respiratory distress. He has no wheezes. He has no rales. He exhibits no tenderness.  Abdominal: Soft. Bowel sounds are normal. He exhibits no distension. There is no tenderness.  Musculoskeletal: Normal range of motion. He exhibits edema. He exhibits no tenderness.  Lymphadenopathy:    He has no cervical adenopathy.  Neurological: He is alert and oriented to person, place, and time. Coordination normal.  Skin: Skin is warm and dry. No rash noted. No erythema.  Psychiatric: He has a normal mood and affect. His behavior is normal. Judgment and thought content normal.           Assessment and Plan

## 2011-09-11 NOTE — Assessment & Plan Note (Signed)
We have encouraged continued exercise, careful diet management in an effort to lose weight. He should possibly consider gastric bypass surgery, though I suspect his underlying lung condition would be a problem.

## 2011-09-19 ENCOUNTER — Telehealth: Payer: Self-pay | Admitting: Cardiovascular Disease

## 2011-09-19 NOTE — Telephone Encounter (Signed)
Pt went to p/u script for clonidine patch and it was over$500 wants to know if DR Rockey Situ could give him something else.

## 2011-09-19 NOTE — Telephone Encounter (Signed)
Well we could hold the clonidine patch Restart clonidine 0.3 mg 3 times a day We'll have him monitor his blood pressure Would also call in a prescription for Diovan HCT 320/25 mg. If his pressure runs high on the clonidine he could fill the Diovan HCT Start with half a pill and monitor the blood pressure, titrating up on the Diovan HCT if needed. Diovan HCT combo is relatively inexpensive. Diovan alone is expensive.

## 2011-09-20 MED ORDER — CLONIDINE HCL 0.3 MG PO TABS: 0.3000 mg | ORAL_TABLET | Freq: Three times a day (TID) | ORAL | Status: DC

## 2011-09-20 MED ORDER — VALSARTAN-HYDROCHLOROTHIAZIDE 320-25 MG PO TABS: 0.5000 | ORAL_TABLET | Freq: Every day | ORAL | Status: DC

## 2011-09-20 NOTE — Telephone Encounter (Signed)
Pt was notified.  Rx for diovan/hctz was sent to [facility] but pt was notified not to start it unless his bp remains high on the clonidine.  He states he does not need the clonidine refilled as he still has some left.

## 2011-10-23 ENCOUNTER — Other Ambulatory Visit: Payer: Medicare PPO

## 2011-10-24 ENCOUNTER — Other Ambulatory Visit (INDEPENDENT_AMBULATORY_CARE_PROVIDER_SITE_OTHER): Payer: Medicare PPO

## 2011-10-24 DIAGNOSIS — E119 Type 2 diabetes mellitus without complications: Secondary | ICD-10-CM

## 2011-10-24 LAB — HEMOGLOBIN A1C: Hgb A1c MFr Bld: 9.6 % — ABNORMAL HIGH (ref 4.6–6.5)

## 2011-10-26 ENCOUNTER — Ambulatory Visit: Payer: Medicare PPO | Admitting: Family Medicine

## 2011-10-31 ENCOUNTER — Ambulatory Visit: Payer: Medicare PPO | Admitting: Family Medicine

## 2011-11-13 ENCOUNTER — Ambulatory Visit: Payer: Medicare PPO | Admitting: Cardiovascular Disease

## 2011-11-21 ENCOUNTER — Other Ambulatory Visit: Payer: Medicare PPO

## 2011-11-28 ENCOUNTER — Ambulatory Visit (INDEPENDENT_AMBULATORY_CARE_PROVIDER_SITE_OTHER): Payer: Medicare PPO | Admitting: Family Medicine

## 2011-11-28 ENCOUNTER — Encounter: Payer: Self-pay | Admitting: Family Medicine

## 2011-11-28 VITALS — BP 146/72 | HR 70 | Temp 98.5°F | Wt 281.8 lb

## 2011-11-28 DIAGNOSIS — L723 Sebaceous cyst: Secondary | ICD-10-CM

## 2011-11-28 DIAGNOSIS — E1142 Type 2 diabetes mellitus with diabetic polyneuropathy: Secondary | ICD-10-CM

## 2011-11-28 DIAGNOSIS — E119 Type 2 diabetes mellitus without complications: Secondary | ICD-10-CM

## 2011-11-28 DIAGNOSIS — E1149 Type 2 diabetes mellitus with other diabetic neurological complication: Secondary | ICD-10-CM

## 2011-11-28 NOTE — Patient Instructions (Addendum)
Check your sugar before each meal for a few days and send me the numbers.   Put a warm compress on your back and gently try to express some of the material. Keep it clean and covered and let me know if it gets more painful. Recheck labs before a 30 min visit in 4-5 months. Take care.

## 2011-11-28 NOTE — Progress Notes (Signed)
Sebaceous cyst on upper back.  It's already draining and is less sore today.  Some material expressed prev.  Diabetes:  Using medications without difficulties: yes Hypoglycemic episodes: rare, can happen if diet is altered significantly.  Hyperglycemic episodes: up to 300 Feet problems: edema at baseline, no ulcers Blood Sugars averaging: as above Diet was off caring for family.  He'd been looking after sister, she died recently from COPD.   He's still short winded but has gotten back on lasix.  He was off it when caring for his sister.   He had been skipping meals.   Sugar usually 150-200 in AM.    Meds, vitals, and allergies reviewed.   ROS: See HPI.  Otherwise negative.    GEN: nad, alert and oriented, on O2 NECK: supple w/o LA CV: rrr. PULM: ctab, no inc wob ABD: soft, +bs EXT: 1-2+ edema SKIN: no acute rash but ruptured seb cyst on back noted, some material expressed.    Diabetic foot exam: Normal inspection No skin breakdown No calluses  Normal DP pulses Normal sensation to light touch and monofilament Nails normal

## 2011-11-29 NOTE — Assessment & Plan Note (Signed)
He'll check sugar 3x/day and notify me so I can help adjust his meds.  His recent family troubles have likely contributed to his difficulty with sugar control recently.  D/w pt about diet and plan for meds.

## 2011-11-29 NOTE — Assessment & Plan Note (Signed)
Continue warm compresses and expression.  If not resolved, we can address with I&D if needed.  He agrees.  Doesn't appear to need abx at this point as it is draining and w/o spreading erythema.  Pain is decreased today.

## 2011-12-04 ENCOUNTER — Ambulatory Visit: Payer: Medicare PPO | Admitting: Family Medicine

## 2011-12-04 ENCOUNTER — Ambulatory Visit: Payer: Medicare PPO | Admitting: Pulmonary Disease

## 2011-12-04 ENCOUNTER — Encounter: Payer: Self-pay | Admitting: Pulmonary Disease

## 2011-12-04 ENCOUNTER — Ambulatory Visit (INDEPENDENT_AMBULATORY_CARE_PROVIDER_SITE_OTHER): Payer: Medicare PPO | Admitting: Pulmonary Disease

## 2011-12-04 VITALS — BP 140/62 | HR 65 | Temp 97.7°F | Ht 65.5 in | Wt 275.0 lb

## 2011-12-04 DIAGNOSIS — G4733 Obstructive sleep apnea (adult) (pediatric): Secondary | ICD-10-CM

## 2011-12-04 DIAGNOSIS — J8410 Pulmonary fibrosis, unspecified: Secondary | ICD-10-CM

## 2011-12-04 NOTE — Assessment & Plan Note (Addendum)
This has been a stable interval for Stephen [name]'s pulmonary fibrosis. His symptoms have not progressed and I do not have objective measurements yet to assess a change from his 03/2011 visit.  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    Losing weight would help with his shortness of breath.  Plan: -obtain baseline CXR -pulmonary rehab referral with 6 [facility] at [facility] -per walk in office today he did not qualify for further O2 on exertion or at rest; will obtain results of oxygenation with 6 MW test to see if he still needs O2 at home

## 2011-12-04 NOTE — Progress Notes (Signed)
Subjective:    Patient ID: Stephen [name], male    DOB: 22-Feb-1946, 66 y.o.   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 He has previously been followed by Dr. Lamonte Sakai in the [place] office for the same.  He is a never smoker.  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  He says that he was never told to use a mask.  Since his diagnosis in 2009 he has had dyspnea on exertion, but fortunately it has not progressed.  He is able to perform his ADL's and is capable of walking on level ground without difficulty.  He does not exercise regularly, though he does walk with his activities (shopping, gardening) daily.  He does not have sinus symptoms or acid reflux symptoms.   He has a rare cough productive of clear phlegm.   Past Medical History  Diagnosis Date  . 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 DM2 (diabetes mellitus, type 2)     insulin requiring  . Diabetes mellitus with neuropathy   . OSA (obstructive sleep apnea)   . HTN (hypertension)   . CHF (congestive heart failure)   . Kidney stones   . Dyslipidemia   . Metabolic syndrome   . Arthritis   . GERD (gastroesophageal reflux disease)   . Heart disease   . High cholesterol   . Kidney disease   . Colon polyps   . Thyroid disorder   . Recurrent UTI   . 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 Adrenal gland anomaly      Family History  Problem Relation Age of Onset  . Heart attack Father   . Heart attack Mother   . Arthritis Maternal Grandmother   . Breast cancer Maternal Aunt   . Diabetes Maternal Aunt   . Hypertension Other   . Heart attack Brother   . Hypertension Maternal Aunt     . COPD Sister      History   Social History  . Marital Status: Married    Spouse Name: N/A    Number of Children: 2  . Years of Education: N/A   Occupational History  . 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     Social History Main Topics  . Smoking status: Never Smoker   . Smokeless tobacco: Never Used  . Alcohol Use: No  . Drug Use: No  . Sexually Active: Not on file   Other Topics Concern  . Not on file   Social History Narrative   Enjoys fishing[place]ankees fan     Allergies  Allergen Reactions  . Calcium Channel Blockers     Would avoid due to h/o peripheral edema     Outpatient Prescriptions Prior to Visit  Medication Sig Dispense Refill  . amitriptyline (ELAVIL) 50 MG tablet Take 50 mg by mouth at bedtime.       Marland Kitchen amLODipine (NORVASC) 10 MG tablet Take 1 tablet (10 mg total) by mouth daily.  30 tablet  11  .  aspirin 81 MG tablet Take 81 mg by mouth 2 (two) times daily.        Marland Kitchen atorvastatin (LIPITOR) 80 MG tablet Take 80 mg by mouth at bedtime.        . carvedilol (COREG) 25 MG tablet Take 1 tablet (25 mg total) by mouth 2 (two) times daily with a meal.  180 tablet  2  . cloNIDine (CATAPRES) 0.3 MG tablet Take 1 tablet (0.3 mg total) by mouth 3 (three) times daily.  90 tablet  11  . clopidogrel (PLAVIX) 75 MG tablet Take 1 tablet (75 mg total) by mouth daily.  90 tablet  3  . finasteride (PROSCAR) 5 MG tablet Once daily      . furosemide (LASIX) 80 MG tablet Take 1 tablet (80 mg total) by mouth every other day.  45 tablet  3  . gabapentin (NEURONTIN) 800 MG tablet 1 tablet in the morning and 1 and 1/2 at bedtime  225 tablet  3  . insulin aspart (NOVOLOG) 100 UNIT/ML injection Inject 50 Units into the skin 3 (three) times daily before meals. Use an extra 5 units with large meals, dispense 15 vials  150 mL  3  . insulin NPH (HUMULIN N,NOVOLIN N) 100 UNIT/ML injection Inject 25  units with breakfast.  10 units at supper.      . isosorbide mononitrate (IMDUR) 60 MG 24 hr tablet Take 1 tablet (60 mg total) by mouth 2 (two) times daily.  180 tablet  2  . lisinopril (PRINIVIL,ZESTRIL) 40 MG tablet Take 1 tablet (40 mg total) by mouth daily.  30 tablet  6  . nitroGLYCERIN (NITROSTAT) 0.4 MG SL tablet Place 0.4 mg under the tongue every 5 (five) minutes as needed. May repeat x3        . omeprazole (PRILOSEC) 20 MG capsule Take 20 mg by mouth daily.        . Tamsulosin HCl (FLOMAX) 0.4 MG CAPS Once daily      . triamterene-hydrochlorothiazide (MAXZIDE-25) 37.5-25 MG per tablet Take 1 each (1 tablet total) by mouth daily.  30 tablet  11  . valsartan-hydrochlorothiazide (DIOVAN HCT) 320-25 MG per tablet Take 0.5 tablets by mouth daily. If your bp is elevated on just the clonidine  30 tablet  6      Review of Systems  Constitutional: Negative for fever, chills, activity change and appetite change.  HENT: Positive for congestion. Negative for hearing loss, ear pain, rhinorrhea, sneezing, neck pain, neck stiffness, postnasal drip and sinus pressure.   Eyes: Negative for redness, itching and visual disturbance.  Respiratory: Positive for shortness of breath. Negative for cough, chest tightness and wheezing.   Cardiovascular: Positive for chest pain and leg swelling. Negative for palpitations.  Gastrointestinal: Negative for nausea, vomiting, abdominal pain, diarrhea, constipation, blood in stool and abdominal distention.  Musculoskeletal: Positive for arthralgias. Negative for myalgias, joint swelling and gait problem.  Skin: Negative for rash.  Neurological: Negative for dizziness, light-headedness, numbness and headaches.  Hematological: Bruises/bleeds easily.  Psychiatric/Behavioral: Negative for confusion and dysphoric mood.       Objective:   Physical Exam Filed Vitals:   12/04/11 1508  BP: 140/62  Pulse: 65  Temp: 97.7 F (36.5 C)  TempSrc: Oral  Height: 5'  5.5" (1.664 m)  Weight: 124.739 kg (275 lb)  SpO2: 97%  2L [place] continuous After walking 5 laps in clinic on room air (500 feet) he did not desaturate  Gen: obese white male, no acute distress  HEENT: NCAT, PERRL, EOMi, OP clear, thick neck supple without masses PULM: CTA B CV: RRR, distant heart sounds, cannot assess JVD due to body habitus AB: BS+, soft, nontender, no hsm Ext: warm, trace edema, no clubbing, no cyanosis Derm: no rash or skin breakdown Neuro: A&Ox4, CN II-XII intact, strength 5/5 in all 4 extremities  2009 [facility] records including pathology report reviewed     Assessment & Plan:   PULMONARY FIBROSIS This has been a stable interval for Stephen [name]'s pulmonary fibrosis. His symptoms have not progressed and I do not have objective measurements yet to assess a change from his 03/2011 visit.  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    Losing weight would help with his shortness of breath.  Plan: -obtain baseline CXR -pulmonary rehab referral with 6 MW at [facility] -per walk in office today he did not qualify for further O2 on exertion or at rest; will obtain results of oxygenation with 6 MW test to see if he still needs O2 at home  [place] He remains compliant, but would do much better if he would lose weight.  Plan: -recommended 20-30 lbs of weight loss -no driving while sleepy -pulmonary rehab referral     Updated Medication List Outpatient Encounter Prescriptions as of 12/04/2011  Medication Sig Dispense Refill  . amitriptyline (ELAVIL) 50 MG tablet Take 50 mg by mouth at bedtime.       Marland Kitchen amLODipine (NORVASC) 10 MG tablet Take 1 tablet (10 mg total) by mouth daily.  30 tablet  11  . aspirin 81 MG tablet Take 81 mg by mouth 2 (two) times daily.        Marland Kitchen atorvastatin (LIPITOR) 80 MG tablet Take 80 mg by mouth at bedtime.        . carvedilol (COREG) 25 MG tablet Take 1 tablet (25  mg total) by mouth 2 (two) times daily with a meal.  180 tablet  2  . cloNIDine (CATAPRES) 0.3 MG tablet Take 1 tablet (0.3 mg total) by mouth 3 (three) times daily.  90 tablet  11  . clopidogrel (PLAVIX) 75 MG tablet Take 1 tablet (75 mg total) by mouth daily.  90 tablet  3  . finasteride (PROSCAR) 5 MG tablet Once daily      . furosemide (LASIX) 80 MG tablet Take 1 tablet (80 mg total) by mouth every other day.  45 tablet  3  . gabapentin (NEURONTIN) 800 MG tablet 1 tablet in the morning and 1 and 1/2 at bedtime  225 tablet  3  . insulin aspart (NOVOLOG) 100 UNIT/ML injection Inject 50 Units into the skin 3 (three) times daily before meals. Use an extra 5 units with large meals, dispense 15 vials  150 mL  3  . insulin NPH (HUMULIN N,NOVOLIN N) 100 UNIT/ML injection Inject 25 units with breakfast.  10 units at supper.      . isosorbide mononitrate (IMDUR) 60 MG 24 hr tablet Take 1 tablet (60 mg total) by mouth 2 (two) times daily.  180 tablet  2  . lisinopril (PRINIVIL,ZESTRIL) 40 MG tablet Take 1 tablet (40 mg total) by mouth daily.  30 tablet  6  . nitroGLYCERIN (NITROSTAT) 0.4 MG SL tablet Place 0.4 mg under the tongue every 5 (five) minutes as needed. May repeat x3        . omeprazole (PRILOSEC) 20 MG capsule Take 20 mg by mouth daily.        Marland Kitchen  Tamsulosin HCl (FLOMAX) 0.4 MG CAPS Once daily      . triamterene-hydrochlorothiazide (MAXZIDE-25) 37.5-25 MG per tablet Take 1 each (1 tablet total) by mouth daily.  30 tablet  11  . valsartan-hydrochlorothiazide (DIOVAN HCT) 320-25 MG per tablet Take 0.5 tablets by mouth daily. If your bp is elevated on just the clonidine  30 tablet  6

## 2011-12-04 NOTE — Assessment & Plan Note (Signed)
He remains compliant, but would do much better if he would lose weight.  Plan: -recommended 20-30 lbs of weight loss -no driving while sleepy -pulmonary rehab referral

## 2011-12-04 NOTE — Patient Instructions (Signed)
We will have you get a Chest X-ray at the [facility] for your pulmonary fibrosis We will send you for a referral to pulmonary rehab at [facility] to help with your exercise tolerance and weight loss. Try to lose 20-30 lbs. To help with your sleep apnea and your shortness of breath. We will see you back in 6 months.

## 2011-12-12 ENCOUNTER — Other Ambulatory Visit: Payer: Self-pay | Admitting: *Deleted

## 2011-12-12 MED ORDER — AMITRIPTYLINE HCL 50 MG PO TABS: 50.0000 mg | ORAL_TABLET | Freq: Every day | ORAL | Status: DC

## 2011-12-12 NOTE — Telephone Encounter (Signed)
Sent!

## 2011-12-12 NOTE — Telephone Encounter (Signed)
Received faxed refill request from pharmacy. Last office visit 11/28/11. Is it okay to refill medication?

## 2011-12-15 ENCOUNTER — Ambulatory Visit (INDEPENDENT_AMBULATORY_CARE_PROVIDER_SITE_OTHER): Payer: Medicare PPO | Admitting: Cardiovascular Disease

## 2011-12-15 ENCOUNTER — Encounter: Payer: Self-pay | Admitting: Cardiovascular Disease

## 2011-12-15 VITALS — BP 130/62 | HR 64 | Ht 65.0 in | Wt 283.5 lb

## 2011-12-15 DIAGNOSIS — E1142 Type 2 diabetes mellitus with diabetic polyneuropathy: Secondary | ICD-10-CM

## 2011-12-15 DIAGNOSIS — E785 Hyperlipidemia, unspecified: Secondary | ICD-10-CM

## 2011-12-15 DIAGNOSIS — I5033 Acute on chronic diastolic (congestive) heart failure: Secondary | ICD-10-CM | POA: Insufficient documentation

## 2011-12-15 DIAGNOSIS — I509 Heart failure, unspecified: Secondary | ICD-10-CM

## 2011-12-15 DIAGNOSIS — I2510 Atherosclerotic heart disease of native coronary artery without angina pectoris: Secondary | ICD-10-CM

## 2011-12-15 DIAGNOSIS — N189 Chronic kidney disease, unspecified: Secondary | ICD-10-CM

## 2011-12-15 DIAGNOSIS — I5032 Chronic diastolic (congestive) heart failure: Secondary | ICD-10-CM | POA: Insufficient documentation

## 2011-12-15 DIAGNOSIS — I10 Essential (primary) hypertension: Secondary | ICD-10-CM

## 2011-12-15 DIAGNOSIS — E1149 Type 2 diabetes mellitus with other diabetic neurological complication: Secondary | ICD-10-CM

## 2011-12-15 NOTE — Assessment & Plan Note (Signed)
We have encouraged better diabetes control given underlying renal dysfunction.

## 2011-12-15 NOTE — Assessment & Plan Note (Signed)
Currently with no symptoms of angina. No further workup at this time. Continue current medication regimen. If he has worsening shortness of breath that does not respond to diuresis, we would pursue an ischemia workup

## 2011-12-15 NOTE — Progress Notes (Signed)
Patient ID: Stephen [name], male    DOB: 07-Sep-1945, 66 y.o.   MRN: [id number]  HPI Comments: Stephen [name] is a very pleasant 66 year old gentleman with a history of coronary artery disease, PCI to the mid RCA and mid left circumflex in October 2007, interstitial fibrosis, with wedge resection, hypertension, hyperlipidemia, remote tobacco abuse, obesity, chronic substernal chest pain who presents for routine followup.  He reports that his blood pressure has been improved. His weight is up 5 pounds and he does have mild worsening edema. He has not been taking his Lasix as he has been taking care of family in the hospital. For about 2 months, he has been noncompliant with Lasix. He feels he has slightly worsening shortness of breath, still requiring oxygen. Hemoglobin A1c 9.6.    Open lung biopsy was performed November 2009 for bilateral pulmonary infiltrates. Stress test July 2011 was dobutamine stress that showed no significant ischemia, inadequate heart rate was achieved.   right and left heart catheterization January 2011 showed 50% mid LAD disease, 60% at the ostium of the diagonal #2, 30% proximal left circumflex disease, patent stent in the left circumflex, patent stent of the mid RCA, wedge pressure of 16, PA pressure mean 22, right ventricular pressure 41/8 .   echocardiogram September 2010 shows normal systolic function, diastolic dysfunction, normal RV function unable to evaluate right ventricular systolic pressures  EKG shows normal sinus rhythm with rate 64 beats per minute with poor R-wave progression to the anterior precordial leads, consider old anterior MI, left axis deviation    Outpatient Encounter Prescriptions as of 12/15/2011  Medication Sig Dispense Refill  . amitriptyline (ELAVIL) 50 MG tablet Take 1 tablet (50 mg total) by mouth at bedtime.  90 tablet  1  . amLODipine (NORVASC) 10 MG tablet Take 1 tablet (10 mg total) by mouth daily.  30 tablet  11  . aspirin 81 MG tablet  Take 81 mg by mouth 2 (two) times daily.        Marland Kitchen atorvastatin (LIPITOR) 80 MG tablet Take 80 mg by mouth at bedtime.        . carvedilol (COREG) 25 MG tablet Take 1 tablet (25 mg total) by mouth 2 (two) times daily with a meal.  180 tablet  2  . cloNIDine (CATAPRES) 0.3 MG tablet Take 1 tablet (0.3 mg total) by mouth 3 (three) times daily.  90 tablet  11  . clopidogrel (PLAVIX) 75 MG tablet Take 1 tablet (75 mg total) by mouth daily.  90 tablet  3  . finasteride (PROSCAR) 5 MG tablet Once daily      . furosemide (LASIX) 80 MG tablet Take 1 tablet (80 mg total) by mouth every other day.  45 tablet  3  . gabapentin (NEURONTIN) 800 MG tablet 1 tablet in the morning and 1 and 1/2 at bedtime  225 tablet  3  . insulin aspart (NOVOLOG) 100 UNIT/ML injection Inject 50 Units into the skin 3 (three) times daily before meals. Use an extra 5 units with large meals, dispense 15 vials  150 mL  3  . insulin NPH (HUMULIN N,NOVOLIN N) 100 UNIT/ML injection Inject 25 units with breakfast.  10 units at supper.      . isosorbide mononitrate (IMDUR) 60 MG 24 hr tablet Take 1 tablet (60 mg total) by mouth 2 (two) times daily.  180 tablet  2  . lisinopril (PRINIVIL,ZESTRIL) 40 MG tablet Take 1 tablet (40 mg total) by mouth daily.  30 tablet  6  . nitroGLYCERIN (NITROSTAT) 0.4 MG SL tablet Place 0.4 mg under the tongue every 5 (five) minutes as needed. May repeat x3        . omeprazole (PRILOSEC) 20 MG capsule Take 20 mg by mouth daily.        . Tamsulosin HCl (FLOMAX) 0.4 MG CAPS Once daily      . valsartan-hydrochlorothiazide (DIOVAN HCT) 320-25 MG per tablet Take 0.5 tablets by mouth daily. If your bp is elevated on just the clonidine  30 tablet  6    Review of Systems  Constitutional: Negative.   HENT: Negative.   Eyes: Negative.   Respiratory: Positive for shortness of breath.   Cardiovascular: Positive for leg swelling.  Gastrointestinal: Negative.   Musculoskeletal: Negative.   Skin: Negative.     Neurological: Negative.   Hematological: Negative.   Psychiatric/Behavioral: Negative.   All other systems reviewed and are negative.   BP 130/62  Pulse 64  Ht 5\' 5"  (1.651 m)  Wt 283 lb 8 oz (128.595 kg)  BMI 47.18 kg/m2   Physical Exam  Nursing note and vitals reviewed. Constitutional: He is oriented to person, place, and time. He appears well-developed and well-nourished.       Morbidly obese.  HENT:  Head: Normocephalic.  Nose: Nose normal.  Mouth/Throat: Oropharynx is clear and moist.  Eyes: Conjunctivae are normal. Pupils are equal, round, and reactive to light.  Neck: Normal range of motion. Neck supple. No JVD present.  Cardiovascular: Normal rate, regular rhythm, S1 normal, S2 normal, normal heart sounds and intact distal pulses.  Exam reveals no gallop and no friction rub.   No murmur heard.      Trace edema, predominantly on the left LE  Pulmonary/Chest: Effort normal. No respiratory distress. He has no wheezes. He has rales. He exhibits no tenderness.  Abdominal: Soft. Bowel sounds are normal. He exhibits no distension. There is no tenderness.  Musculoskeletal: Normal range of motion. He exhibits edema. He exhibits no tenderness.  Lymphadenopathy:    He has no cervical adenopathy.  Neurological: He is alert and oriented to person, place, and time. Coordination normal.  Skin: Skin is warm and dry. No rash noted. No erythema.  Psychiatric: He has a normal mood and affect. His behavior is normal. Judgment and thought content normal.           Assessment and Plan

## 2011-12-15 NOTE — Patient Instructions (Addendum)
You are doing well. No medication changes were made. Please take Lasix more consistently  Continue to work on you sugars and weight  Please call us if you have new issues that need to be addressed before your next appt.  Your physician wants you to follow-up in: 6 months.  You will receive a reminder letter in the mail two months in advance. If you don't receive a letter, please call our office to schedule the follow-up appointment.

## 2011-12-15 NOTE — Assessment & Plan Note (Signed)
We have encouraged him to stay on his Lasix. He has been noncompliant recently taking care of family in the hospital. His legs have more edema, increased shortness of breath. I've asked him to monitor his weight and call our office if his symptoms get worse. We would increase the Lasix to twice a day for worsening symptoms.

## 2011-12-15 NOTE — Assessment & Plan Note (Signed)
Blood pressure is well controlled on today's visit. No changes made to the medications. 

## 2011-12-15 NOTE — Assessment & Plan Note (Signed)
Continue Lipitor. Goal LDL less than 70. This will improve with better diabetes control.

## 2011-12-15 NOTE — Assessment & Plan Note (Signed)
We have encouraged continued exercise, careful diet management in an effort to lose weight. 

## 2012-01-02 ENCOUNTER — Telehealth: Payer: Self-pay | Admitting: *Deleted

## 2012-01-02 NOTE — Telephone Encounter (Signed)
Breakfast Lunch  Dinner    196     186    150    150     180    175    140     190    180 Patient says he needs to change his Insulin to 70/30.  He can get 3 bottles for $295 (90 day Rx).  90 days on the current regimen is over $1,000.

## 2012-01-03 NOTE — Telephone Encounter (Signed)
I can't adjust his meds and change the formulation at the same time.  Would change to 70/30 first, he'll need to check sugar 3x/day after the change as he did with this and notify me.  Verify with patient the total number of units of insulin he is using in a day (ie total of all forms of insulin).  When he makes the change, he'll only be on 2 shots a day initially, both of the 70/30.  Thanks.

## 2012-01-03 NOTE — Telephone Encounter (Signed)
Patient notified as instructed by telephone. Was advised by patient that he is taking Novolin N 30 units every am and 10 units every pm if his BS is over 200 and he takes Novolog-aspart 50 units three times a day.

## 2012-01-04 MED ORDER — INSULIN ASPART PROT & ASPART (70-30 MIX) 100 UNIT/ML ~~LOC~~ SUSP: SUBCUTANEOUS | Status: DC

## 2012-01-04 NOTE — Telephone Encounter (Signed)
Start with 75 units of 70/30 in AM and 25 units at dinner. He'll need to check sugar 3 times a day, notify me of results a few days after the change.  He is at risk for high and low sugars during the change, so he should be aware of that.  I am intentionally undershooting his dose to prevent nocturnal hypoglycemia.  He will need to eat on a regular basis.  rx sent.

## 2012-01-05 NOTE — Telephone Encounter (Signed)
Spoke with patient and advised results, pt wrote down instructions and will call on Monday with any changes.

## 2012-01-08 ENCOUNTER — Telehealth: Payer: Self-pay | Admitting: *Deleted

## 2012-01-08 NOTE — Telephone Encounter (Signed)
Received faxed form requesting an order for CPAP supplies. Form needs to be completed and faxed back. Called and spoke to patient and was advised that he did request that [employer] send this request to Dr. [name]. Patient wanted to let you know that he is waiting for [facility] to get his new insulin in before he makes the change and will call back with blood sugar readings after making the changes on his insulin. Form from [facility] [employer] is in your in box.

## 2012-01-08 NOTE — Telephone Encounter (Signed)
I'll address the hard copy.  

## 2012-01-09 ENCOUNTER — Telehealth: Payer: Self-pay | Admitting: Pulmonary Disease

## 2012-01-09 NOTE — Telephone Encounter (Signed)
Completed form faxed to [place].

## 2012-01-09 NOTE — Telephone Encounter (Signed)
noted 

## 2012-01-09 NOTE — Telephone Encounter (Signed)
Will forward to Dr. Lake Bells as an Juluis Rainier

## 2012-01-15 ENCOUNTER — Other Ambulatory Visit: Payer: Self-pay | Admitting: Cardiovascular Disease

## 2012-01-15 ENCOUNTER — Other Ambulatory Visit: Payer: Self-pay | Admitting: *Deleted

## 2012-01-15 MED ORDER — CARVEDILOL 25 MG PO TABS: 25.0000 mg | ORAL_TABLET | Freq: Two times a day (BID) | ORAL | Status: DC

## 2012-01-15 NOTE — Telephone Encounter (Signed)
Refilled Lisinopril. 

## 2012-02-12 ENCOUNTER — Other Ambulatory Visit: Payer: Self-pay | Admitting: Cardiovascular Disease

## 2012-02-22 ENCOUNTER — Other Ambulatory Visit: Payer: Self-pay

## 2012-02-22 MED ORDER — ATORVASTATIN CALCIUM 80 MG PO TABS: 80.0000 mg | ORAL_TABLET | Freq: Every day | ORAL | Status: DC

## 2012-02-22 NOTE — Telephone Encounter (Signed)
Pt request refill Atorvastatin. Sent to [facility]. Pt notified done while on phone.

## 2012-03-13 ENCOUNTER — Encounter: Payer: Self-pay | Admitting: Family Medicine

## 2012-03-13 ENCOUNTER — Ambulatory Visit: Payer: Self-pay | Admitting: Family Medicine

## 2012-03-13 ENCOUNTER — Ambulatory Visit (INDEPENDENT_AMBULATORY_CARE_PROVIDER_SITE_OTHER): Payer: Medicare PPO | Admitting: Family Medicine

## 2012-03-13 VITALS — BP 120/60 | HR 62 | Temp 98.3°F | Wt 284.0 lb

## 2012-03-13 DIAGNOSIS — M7989 Other specified soft tissue disorders: Secondary | ICD-10-CM

## 2012-03-13 DIAGNOSIS — M79609 Pain in unspecified limb: Secondary | ICD-10-CM

## 2012-03-13 DIAGNOSIS — M79606 Pain in leg, unspecified: Secondary | ICD-10-CM

## 2012-03-13 MED ORDER — CEPHALEXIN 500 MG PO CAPS: 1000.0000 mg | ORAL_CAPSULE | Freq: Two times a day (BID) | ORAL | Status: DC

## 2012-03-13 NOTE — Progress Notes (Signed)
Therapist, music at [facility] [place] [address] Phone: [phone] Fax: [phone]  Date:  03/13/2012   Name:  Stephen [name]   DOB:  Jan 13, 1946   MRN:  [id number] Gender: male Age: 66 y.o.  PCP:  Elsie Stain, MD    Chief Complaint: Leg Pain   History of Present Illness:  Stephen [name] is a 66 y.o. pleasant patient who presents with the following:  Pleasant 66 year old gentleman who presents with acute red or pinkish coloration in his lower extremity. Significant swelling. This is all new compared to prior to a few days ago. It is mild to moderately warm to touch. No trauma or accident injury. No known history of prior PE. No history of DVT. No open sores are visible.  Patient Active Problem List  Diagnosis  . Type II or unspecified type diabetes mellitus with neurological manifestations, uncontrolled  . METABOLIC SYNDROME X  . SLEEP APNEA, OBSTRUCTIVE  . CAD, NATIVE VESSEL  . RENAL INSUFFICIENCY, CHRONIC  . PULMONARY FIBROSIS  . Essential hypertension, benign  . Back pain  . Kidney stones  . Abnormal CT scan  . Adrenal adenoma  . HLD (hyperlipidemia)  . Morbid obesity  . Sebaceous cyst  . Chronic diastolic CHF (congestive heart failure)    Past Medical History  Diagnosis Date  . 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 DM2 (diabetes mellitus, type 2)     insulin requiring  . Diabetes mellitus with neuropathy   . OSA (obstructive sleep apnea)   . HTN (hypertension)   . CHF (congestive heart failure)   . Kidney stones   . Dyslipidemia   . Metabolic syndrome   . Arthritis   . GERD (gastroesophageal reflux disease)   . Heart disease   . High cholesterol   . Kidney disease   . Colon polyps   . Thyroid disorder   . Recurrent UTI   . 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 Adrenal gland anomaly     Past Surgical History    Procedure Date  . Adrenal adenoma removal 1990-    right  . Coronary angioplasty with stent placement 2007    x 2  . Knee surgery     right  . Lung biopsy   . Carpal tunnel release     bilateral    History  Substance Use Topics  . Smoking status: Never Smoker   . Smokeless tobacco: Never Used  . Alcohol Use: No    Family History  Problem Relation Age of Onset  . Heart attack Father   . Heart attack Mother   . Arthritis Maternal Grandmother   . Breast cancer Maternal Aunt   . Diabetes Maternal Aunt   . Hypertension Other   . Heart attack Brother   . Hypertension Maternal Aunt   . COPD Sister     Allergies  Allergen Reactions  . Calcium Channel Blockers     Would avoid due to h/o peripheral edema    Medication list has been reviewed and updated.  Current Outpatient Prescriptions on File Prior to Visit  Medication Sig Dispense Refill  . amitriptyline (ELAVIL) 50 MG tablet Take 1 tablet (50 mg total) by mouth at bedtime.  90 tablet  1  . amLODipine (NORVASC) 10 MG tablet Take 1 tablet (10 mg total) by  mouth daily.  30 tablet  11  . aspirin 81 MG tablet Take 81 mg by mouth 2 (two) times daily.        Marland Kitchen atorvastatin (LIPITOR) 80 MG tablet Take 1 tablet (80 mg total) by mouth at bedtime.  90 tablet  3  . carvedilol (COREG) 25 MG tablet Take 1 tablet (25 mg total) by mouth 2 (two) times daily with a meal.  180 tablet  2  . cloNIDine (CATAPRES) 0.3 MG tablet Take 1 tablet (0.3 mg total) by mouth 3 (three) times daily.  90 tablet  11  . clopidogrel (PLAVIX) 75 MG tablet Take 1 tablet (75 mg total) by mouth daily.  90 tablet  3  . clopidogrel (PLAVIX) 75 MG tablet TAKE ONE TABLET BY MOUTH EVERY DAY  90 tablet  1  . finasteride (PROSCAR) 5 MG tablet Once daily      . furosemide (LASIX) 80 MG tablet Take 1 tablet (80 mg total) by mouth every other day.  45 tablet  3  . gabapentin (NEURONTIN) 800 MG tablet 1 tablet in the morning and 1 and 1/2 at bedtime  225 tablet  3  .  insulin aspart protamine-insulin aspart (NOVOLOG MIX 70/30) (70-30) 100 UNIT/ML injection 75 units in AM, 25 units at dinner. Disp 9 vials  9 mL  3  . isosorbide mononitrate (IMDUR) 60 MG 24 hr tablet Take 1 tablet (60 mg total) by mouth 2 (two) times daily.  180 tablet  2  . lisinopril (PRINIVIL,ZESTRIL) 40 MG tablet TAKE ONE TABLET BY MOUTH EVERY DAY  30 tablet  5  . nitroGLYCERIN (NITROSTAT) 0.4 MG SL tablet Place 0.4 mg under the tongue every 5 (five) minutes as needed. May repeat x3        . omeprazole (PRILOSEC) 20 MG capsule Take 20 mg by mouth daily.        . Tamsulosin HCl (FLOMAX) 0.4 MG CAPS Once daily      . valsartan-hydrochlorothiazide (DIOVAN HCT) 320-25 MG per tablet Take 0.5 tablets by mouth daily. If your bp is elevated on just the clonidine  30 tablet  6    Review of Systems:  No systemic fever or chills. No chest pain or shortness of breath.  Physical Examination: Filed Vitals:   03/13/12 1039  BP: 120/60  Pulse: 62  Temp: 98.3 F (36.8 C)   Filed Vitals:   03/13/12 1039  Weight: 284 lb (128.822 kg)   There is no height on file to calculate BMI. Ideal Body Weight:     GEN: WDWN, NAD, Non-toxic, Alert & Oriented x 3 HEENT: Atraumatic, Normocephalic.  Ears and Nose: No external deformity. EXTR: No clubbing/cyanosis/edema NEURO: Normal gait.  PSYCH: Normally interactive. Conversant. Not depressed or anxious appearing.  Calm demeanor.  SKIN: Right lower extremity is mildly pinkish in appearance. Swollen is noticeable compared to the other leg. He does have 2+ pitting edema. Painful Homans test.  Assessment and Plan:  1. Leg pain  cephALEXin (KEFLEX) 500 MG capsule, US Venous Img Lower Unilateral Right  2. Leg swelling  cephALEXin (KEFLEX) 500 MG capsule, US Venous Img Lower Unilateral Right   Cellulitis versus potential DVT.  Given this clinical scenario and a positive Homans test on examination, will obtain ultrasound of the right lower extremity to rule  out potential deep venous thrombosis.  Orders Today:  Orders Placed This Encounter  Procedures  . US Venous Img Lower Unilateral Right    Standing Status: Future  Number of Occurrences:      Standing Expiration Date: 05/11/2013    Order Specific Question:  Reason for exam:    Answer:  rule out dvt, swelling, pain, redness    Order Specific Question:  Preferred imaging location?    Answer:  External    Medications Today: (Includes new updates added during medication reconciliation) Meds ordered this encounter  Medications  . cephALEXin (KEFLEX) 500 MG capsule    Sig: Take 2 capsules (1,000 mg total) by mouth 2 (two) times daily.    Dispense:  40 capsule    Refill:  0    Medications Discontinued: There are no discontinued medications.   Owens Loffler, MD

## 2012-03-13 NOTE — Patient Instructions (Addendum)
REFERRAL: GO THE THE FRONT ROOM AT THE ENTRANCE OF OUR CLINIC, NEAR CHECK IN. ASK FOR MARION. SHE WILL HELP YOU SET UP YOUR REFERRAL. DATE: TIME:  

## 2012-03-15 ENCOUNTER — Ambulatory Visit (INDEPENDENT_AMBULATORY_CARE_PROVIDER_SITE_OTHER): Payer: Medicare PPO | Admitting: Family Medicine

## 2012-03-15 ENCOUNTER — Encounter: Payer: Self-pay | Admitting: Family Medicine

## 2012-03-15 VITALS — BP 130/70 | HR 62 | Temp 97.7°F

## 2012-03-15 DIAGNOSIS — L0291 Cutaneous abscess, unspecified: Secondary | ICD-10-CM

## 2012-03-15 DIAGNOSIS — L0390 Cellulitis, unspecified: Secondary | ICD-10-CM

## 2012-03-15 MED ORDER — SULFAMETHOXAZOLE-TMP DS 800-160 MG PO TABS: 2.0000 | ORAL_TABLET | Freq: Two times a day (BID) | ORAL | Status: DC

## 2012-03-15 MED ORDER — AMOXICILLIN-POT CLAVULANATE 875-125 MG PO TABS: 1.0000 | ORAL_TABLET | Freq: Two times a day (BID) | ORAL | Status: DC

## 2012-03-15 NOTE — Assessment & Plan Note (Signed)
No improvement on keflex . In a diabetic will broaden antibiotics to augmentin and sulfa. Close follow up on Monday with PCP.  Also consider if not improving further, incorrect diagnosis and possiblity that erythema and warmth is from significant swelling alone.

## 2012-03-15 NOTE — Progress Notes (Signed)
  Subjective:    Patient ID: Stephen [name], male    DOB: January 10, 1946, 66 y.o.   MRN: [id number]  HPI 66 year old male pt of Dr. Lillie Fragmin with complicated medical history including DM, CAD,CHF  On lasix 80 mg every other day pulmonary fibrosis on oxygen presents for followup on cellulitis. He was seen by Dr. Lorelei Pont on 9/11 with redness and swelling in right... Cellulitis vs DVT.  Dopplers were negative.  He was started on keflex 1000 mg BID x 10 days.   Since then he reports no change in swelling or redness, not worse but not better. Leg is still tender from  Knee to ankle. No fever. No flu like illness. Stable SOB.  Has been elevating leg. Has not missed any dose of keflex.  No hx of MRSA, no sick contacts.      Review of Systems  Constitutional: Negative for fever and fatigue.  HENT: Negative for ear pain.   Eyes: Negative for pain.  Respiratory: Negative for shortness of breath.   Cardiovascular: Negative for chest pain.  Gastrointestinal: Negative for abdominal pain.       Objective:   Physical Exam  Constitutional: He is oriented to person, place, and time. He appears well-developed and well-nourished.       obese  Neck: Normal range of motion. Neck supple.  Cardiovascular: Normal rate and regular rhythm.   Pulmonary/Chest: Effort normal. No respiratory distress. He has rales.       On oxygen  Neurological: He is alert and oriented to person, place, and time.  Skin:       Right leg diffusely erythematous from upper calf to foot, no sharp demarcation. 3 + edema, right has 1 plus but no erythema          Assessment & Plan:

## 2012-03-15 NOTE — Patient Instructions (Addendum)
Stop keflex. Change antibiotics to augmentin and sulfa antibiotic.  Push fluids. Elevate foot above heart.  Make follow up appt with Dr. Lorelei Pont on Monday. If fever on antibitoics, redness spreading or pain/swelling increasing over weekend.. Go to ER.

## 2012-03-18 ENCOUNTER — Encounter: Payer: Self-pay | Admitting: Family Medicine

## 2012-03-18 ENCOUNTER — Ambulatory Visit (INDEPENDENT_AMBULATORY_CARE_PROVIDER_SITE_OTHER): Payer: Medicare PPO | Admitting: Family Medicine

## 2012-03-18 VITALS — BP 126/70 | HR 62 | Temp 98.5°F | Wt 286.5 lb

## 2012-03-18 DIAGNOSIS — L02419 Cutaneous abscess of limb, unspecified: Secondary | ICD-10-CM

## 2012-03-18 MED ORDER — DOXYCYCLINE HYCLATE 100 MG PO TABS: 100.0000 mg | ORAL_TABLET | Freq: Two times a day (BID) | ORAL | Status: DC

## 2012-03-18 MED ORDER — CEFTRIAXONE SODIUM 1 G IJ SOLR
2.0000 g | Freq: Once | INTRAMUSCULAR | Status: AC
  Administered 2012-03-18: 2 g via INTRAMUSCULAR

## 2012-03-18 NOTE — Addendum Note (Signed)
Addended by: Emelia Salisbury C on: 03/18/2012 01:07 PM   Modules accepted: Orders

## 2012-03-18 NOTE — Progress Notes (Signed)
Therapist, music at [facility] [place] [address] Phone: [phone] Fax: [phone]  Date:  03/18/2012   Name:  Stephen [name]   DOB:  [birth year]   MRN:  [id number] Gender: male Age: 66 y.o.  PCP:  Elsie Stain, MD    Chief Complaint: Follow-up   History of Present Illness:  Stephen [name] is a 12 y.o. pleasant patient who presents with the following:  Medically complex patient, ruled out for DVT last wed (5 days prior), then seen by Dr. [name] on Friday. Originally placed on Keflex, then switched to Augmentin and Septra on Friday. Still without much improvement today.  Patient Active Problem List  Diagnosis  . Type II or unspecified type diabetes mellitus with neurological manifestations, uncontrolled  . METABOLIC SYNDROME X  . SLEEP APNEA, OBSTRUCTIVE  . CAD, NATIVE VESSEL  . RENAL INSUFFICIENCY, CHRONIC  . PULMONARY FIBROSIS  . Essential hypertension, benign  . Back pain  . Kidney stones  . Abnormal CT scan  . Adrenal adenoma  . HLD (hyperlipidemia)  . Morbid obesity  . Sebaceous cyst  . Chronic diastolic CHF (congestive heart failure)  . Cellulitis    Past Medical History  Diagnosis Date  . 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 DM2 (diabetes mellitus, type 2)     insulin requiring  . Diabetes mellitus with neuropathy   . OSA (obstructive sleep apnea)   . HTN (hypertension)   . CHF (congestive heart failure)   . Kidney stones   . Dyslipidemia   . Metabolic syndrome   . Arthritis   . GERD (gastroesophageal reflux disease)   . Heart disease   . High cholesterol   . Kidney disease   . Colon polyps   . Thyroid disorder   . Recurrent UTI   . 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 Adrenal gland anomaly     Past Surgical History  Procedure Date  . Adrenal adenoma removal 1990-    right  . Coronary angioplasty with  stent placement 2007    x 2  . Knee surgery     right  . Lung biopsy   . Carpal tunnel release     bilateral    History  Substance Use Topics  . Smoking status: Never Smoker   . Smokeless tobacco: Never Used  . Alcohol Use: No    Family History  Problem Relation Age of Onset  . Heart attack Father   . Heart attack Mother   . Arthritis Maternal Grandmother   . Breast cancer Maternal Aunt   . Diabetes Maternal Aunt   . Hypertension Other   . Heart attack Brother   . Hypertension Maternal Aunt   . COPD Sister     Allergies  Allergen Reactions  . Calcium Channel Blockers     Would avoid due to h/o peripheral edema    Medication list has been reviewed and updated.  Outpatient Prescriptions Prior to Visit  Medication Sig Dispense Refill  . amitriptyline (ELAVIL) 50 MG tablet Take 1 tablet (50 mg total) by mouth at bedtime.  90 tablet  1  . amLODipine (NORVASC) 10 MG tablet Take 1 tablet (10 mg total) by mouth daily.  30 tablet  11  . amoxicillin-clavulanate (AUGMENTIN) 875-125 MG per tablet Take 1 tablet by mouth 2 (two) times  daily.  20 tablet  0  . aspirin 81 MG tablet Take 81 mg by mouth 2 (two) times daily.        Marland Kitchen atorvastatin (LIPITOR) 80 MG tablet Take 1 tablet (80 mg total) by mouth at bedtime.  90 tablet  3  . cloNIDine (CATAPRES) 0.3 MG tablet Take 1 tablet (0.3 mg total) by mouth 3 (three) times daily.  90 tablet  11  . clopidogrel (PLAVIX) 75 MG tablet TAKE ONE TABLET BY MOUTH EVERY DAY  90 tablet  1  . finasteride (PROSCAR) 5 MG tablet Once daily      . furosemide (LASIX) 80 MG tablet Take 1 tablet (80 mg total) by mouth every other day.  45 tablet  3  . gabapentin (NEURONTIN) 800 MG tablet 1 tablet in the morning and 1 and 1/2 at bedtime  225 tablet  3  . insulin aspart protamine-insulin aspart (NOVOLOG MIX 70/30) (70-30) 100 UNIT/ML injection 75 units in AM, 25 units at dinner. Disp 9 vials  9 mL  3  . isosorbide mononitrate (IMDUR) 60 MG 24 hr tablet Take  1 tablet (60 mg total) by mouth 2 (two) times daily.  180 tablet  2  . lisinopril (PRINIVIL,ZESTRIL) 40 MG tablet TAKE ONE TABLET BY MOUTH EVERY DAY  30 tablet  5  . nitroGLYCERIN (NITROSTAT) 0.4 MG SL tablet Place 0.4 mg under the tongue every 5 (five) minutes as needed. May repeat x3        . omeprazole (PRILOSEC) 20 MG capsule Take 20 mg by mouth daily.        . Tamsulosin HCl (FLOMAX) 0.4 MG CAPS Once daily      . valsartan-hydrochlorothiazide (DIOVAN HCT) 320-25 MG per tablet Take 0.5 tablets by mouth daily. If your bp is elevated on just the clonidine  30 tablet  6  . cephALEXin (KEFLEX) 500 MG capsule Take 2 capsules (1,000 mg total) by mouth 2 (two) times daily.  40 capsule  0  . sulfamethoxazole-trimethoprim (BACTRIM DS) 800-160 MG per tablet Take 2 tablets by mouth 2 (two) times daily.  40 tablet  0  . carvedilol (COREG) 25 MG tablet Take 1 tablet (25 mg total) by mouth 2 (two) times daily with a meal.  180 tablet  2    Review of Systems:  ROS: GEN: Acute illness details above GI: Tolerating PO intake GU: maintaining adequate hydration and urination Pulm: No SOB Interactive and getting along well at home.  Otherwise, ROS is as per the HPI.   Physical Examination: Filed Vitals:   03/18/12 1021  BP: 126/70  Pulse: 62  Temp: 98.5 F (36.9 C)   Filed Vitals:   03/18/12 1021  Weight: 286 lb 8 oz (129.956 kg)   There is no height on file to calculate BMI. Ideal Body Weight:     GEN: WDWN, NAD, Non-toxic, Alert & Oriented x 3 HEENT: Atraumatic, Normocephalic.  Ears and Nose: No external deformity. EXTR: Swelling, redness and warmth, R LE, below knee NEURO: Normal gait.  PSYCH: Normally interactive. Conversant. Not depressed or anxious appearing.  Calm demeanor.    Assessment and Plan:  1. Cellulitis and abscess of leg    Currently AF. Will do a trial of 2 grams of Rocephin right now with close f/u Wed with Dr. [name]. Change to Augmentin + Doxy.  If treatment  failure despite all this, then admission may be needed.  Orders Today:  No orders of the defined types were placed in  this encounter.    Updated Medication List: (Includes new medications, updates to list, dose adjustments) Outpatient Encounter Prescriptions as of 03/18/2012  Medication Sig Dispense Refill  . amitriptyline (ELAVIL) 50 MG tablet Take 1 tablet (50 mg total) by mouth at bedtime.  90 tablet  1  . amLODipine (NORVASC) 10 MG tablet Take 1 tablet (10 mg total) by mouth daily.  30 tablet  11  . amoxicillin-clavulanate (AUGMENTIN) 875-125 MG per tablet Take 1 tablet by mouth 2 (two) times daily.  20 tablet  0  . aspirin 81 MG tablet Take 81 mg by mouth 2 (two) times daily.        Marland Kitchen atorvastatin (LIPITOR) 80 MG tablet Take 1 tablet (80 mg total) by mouth at bedtime.  90 tablet  3  . cloNIDine (CATAPRES) 0.3 MG tablet Take 1 tablet (0.3 mg total) by mouth 3 (three) times daily.  90 tablet  11  . clopidogrel (PLAVIX) 75 MG tablet TAKE ONE TABLET BY MOUTH EVERY DAY  90 tablet  1  . finasteride (PROSCAR) 5 MG tablet Once daily      . furosemide (LASIX) 80 MG tablet Take 1 tablet (80 mg total) by mouth every other day.  45 tablet  3  . gabapentin (NEURONTIN) 800 MG tablet 1 tablet in the morning and 1 and 1/2 at bedtime  225 tablet  3  . insulin aspart protamine-insulin aspart (NOVOLOG MIX 70/30) (70-30) 100 UNIT/ML injection 75 units in AM, 25 units at dinner. Disp 9 vials  9 mL  3  . isosorbide mononitrate (IMDUR) 60 MG 24 hr tablet Take 1 tablet (60 mg total) by mouth 2 (two) times daily.  180 tablet  2  . lisinopril (PRINIVIL,ZESTRIL) 40 MG tablet TAKE ONE TABLET BY MOUTH EVERY DAY  30 tablet  5  . nitroGLYCERIN (NITROSTAT) 0.4 MG SL tablet Place 0.4 mg under the tongue every 5 (five) minutes as needed. May repeat x3        . omeprazole (PRILOSEC) 20 MG capsule Take 20 mg by mouth daily.        . Tamsulosin HCl (FLOMAX) 0.4 MG CAPS Once daily      . valsartan-hydrochlorothiazide  (DIOVAN HCT) 320-25 MG per tablet Take 0.5 tablets by mouth daily. If your bp is elevated on just the clonidine  30 tablet  6  . DISCONTD: cephALEXin (KEFLEX) 500 MG capsule Take 2 capsules (1,000 mg total) by mouth 2 (two) times daily.  40 capsule  0  . DISCONTD: sulfamethoxazole-trimethoprim (BACTRIM DS) 800-160 MG per tablet Take 2 tablets by mouth 2 (two) times daily.  40 tablet  0  . doxycycline (VIBRA-TABS) 100 MG tablet Take 1 tablet (100 mg total) by mouth 2 (two) times daily.  20 tablet  0  . DISCONTD: carvedilol (COREG) 25 MG tablet Take 1 tablet (25 mg total) by mouth 2 (two) times daily with a meal.  180 tablet  2    Medications Discontinued: Medications Discontinued During This Encounter  Medication Reason  . carvedilol (COREG) 25 MG tablet Change in therapy  . cephALEXin (KEFLEX) 500 MG capsule   . sulfamethoxazole-trimethoprim (BACTRIM DS) 800-160 MG per tablet      Owens Loffler, MD

## 2012-03-18 NOTE — Patient Instructions (Addendum)
Keep taking the Augmentin (amoxicillin + clavuronic acid)  Stop septra  Start Doxycyling  Getting shot today.  Recheck with Dr. [name] on Wed or Thursday.

## 2012-03-20 ENCOUNTER — Encounter (HOSPITAL_COMMUNITY): Payer: Self-pay | Admitting: Emergency Medicine

## 2012-03-20 ENCOUNTER — Ambulatory Visit (INDEPENDENT_AMBULATORY_CARE_PROVIDER_SITE_OTHER): Payer: Medicare PPO | Admitting: Family Medicine

## 2012-03-20 ENCOUNTER — Encounter: Payer: Self-pay | Admitting: Family Medicine

## 2012-03-20 ENCOUNTER — Observation Stay (HOSPITAL_COMMUNITY)
Admission: EM | Admit: 2012-03-20 | Discharge: 2012-03-21 | Disposition: A | Discharge status: Home / Self Care | Payer: Medicare PPO | Attending: Emergency Medicine | Admitting: Emergency Medicine

## 2012-03-20 VITALS — BP 148/74 | HR 64 | Temp 99.2°F

## 2012-03-20 DIAGNOSIS — Z79899 Other long term (current) drug therapy: Secondary | ICD-10-CM | POA: Insufficient documentation

## 2012-03-20 DIAGNOSIS — L0291 Cutaneous abscess, unspecified: Secondary | ICD-10-CM

## 2012-03-20 DIAGNOSIS — N289 Disorder of kidney and ureter, unspecified: Secondary | ICD-10-CM

## 2012-03-20 DIAGNOSIS — D72829 Elevated white blood cell count, unspecified: Secondary | ICD-10-CM

## 2012-03-20 DIAGNOSIS — G4733 Obstructive sleep apnea (adult) (pediatric): Secondary | ICD-10-CM | POA: Insufficient documentation

## 2012-03-20 DIAGNOSIS — E1142 Type 2 diabetes mellitus with diabetic polyneuropathy: Secondary | ICD-10-CM | POA: Insufficient documentation

## 2012-03-20 DIAGNOSIS — L0390 Cellulitis, unspecified: Secondary | ICD-10-CM

## 2012-03-20 DIAGNOSIS — I2510 Atherosclerotic heart disease of native coronary artery without angina pectoris: Secondary | ICD-10-CM | POA: Insufficient documentation

## 2012-03-20 DIAGNOSIS — K219 Gastro-esophageal reflux disease without esophagitis: Secondary | ICD-10-CM | POA: Insufficient documentation

## 2012-03-20 DIAGNOSIS — Z794 Long term (current) use of insulin: Secondary | ICD-10-CM | POA: Insufficient documentation

## 2012-03-20 DIAGNOSIS — L02419 Cutaneous abscess of limb, unspecified: Principal | ICD-10-CM | POA: Insufficient documentation

## 2012-03-20 DIAGNOSIS — E1149 Type 2 diabetes mellitus with other diabetic neurological complication: Secondary | ICD-10-CM | POA: Insufficient documentation

## 2012-03-20 DIAGNOSIS — I10 Essential (primary) hypertension: Secondary | ICD-10-CM

## 2012-03-20 DIAGNOSIS — Z7982 Long term (current) use of aspirin: Secondary | ICD-10-CM | POA: Insufficient documentation

## 2012-03-20 DIAGNOSIS — E785 Hyperlipidemia, unspecified: Secondary | ICD-10-CM | POA: Insufficient documentation

## 2012-03-20 LAB — BASIC METABOLIC PANEL
BUN: 18 mg/dL (ref 6–23)
CO2: 25 mEq/L (ref 19–32)
Calcium: 8.9 mg/dL (ref 8.4–10.5)
Chloride: 104 mEq/L (ref 96–112)
Creatinine, Ser: 1.54 mg/dL — ABNORMAL HIGH (ref 0.50–1.35)
GFR calc Af Amer: 53 mL/min — ABNORMAL LOW (ref >90)
GFR calc non Af Amer: 46 mL/min — ABNORMAL LOW (ref >90)
Glucose, Bld: 179 mg/dL — ABNORMAL HIGH (ref 70–99)
Potassium: 4.1 mEq/L (ref 3.5–5.1)
Sodium: 139 mEq/L (ref 135–145)

## 2012-03-20 LAB — CBC WITH DIFFERENTIAL/PLATELET
Basophils Absolute: 0 10*3/uL (ref 0.0–0.1)
Basophils Relative: 0 % (ref 0–1)
Eosinophils Absolute: 0.3 10*3/uL (ref 0.0–0.7)
Eosinophils Relative: 3 % (ref 0–5)
HCT: 36.4 % — ABNORMAL LOW (ref 39.0–52.0)
Hemoglobin: 12.4 g/dL — ABNORMAL LOW (ref 13.0–17.0)
Lymphocytes Relative: 13 % (ref 12–46)
Lymphs Abs: 1.4 10*3/uL (ref 0.7–4.0)
MCH: 28.7 pg (ref 26.0–34.0)
MCHC: 34.1 g/dL (ref 30.0–36.0)
MCV: 84.3 fL (ref 78.0–100.0)
Monocytes Absolute: 0.6 10*3/uL (ref 0.1–1.0)
Monocytes Relative: 6 % (ref 3–12)
Neutro Abs: 8 10*3/uL — ABNORMAL HIGH (ref 1.7–7.7)
Neutrophils Relative %: 78 % — ABNORMAL HIGH (ref 43–77)
Platelets: 324 10*3/uL (ref 150–400)
RBC: 4.32 MIL/uL (ref 4.22–5.81)
RDW: 13.3 % (ref 11.5–15.5)
WBC: 10.3 10*3/uL (ref 4.0–10.5)

## 2012-03-20 LAB — GLUCOSE, CAPILLARY: Glucose-Capillary: 159 mg/dL — ABNORMAL HIGH (ref 70–99)

## 2012-03-20 MED ORDER — CLINDAMYCIN PHOSPHATE 600 MG/50ML IV SOLN
600.0000 mg | Freq: Three times a day (TID) | INTRAVENOUS | Status: AC
  Administered 2012-03-21 (×2): 600 mg via INTRAVENOUS
  Filled 2012-03-20 (×2): qty 50

## 2012-03-20 MED ORDER — CLINDAMYCIN PHOSPHATE 600 MG/50ML IV SOLN
600.0000 mg | Freq: Once | INTRAVENOUS | Status: AC
  Administered 2012-03-20: 600 mg via INTRAVENOUS
  Filled 2012-03-20: qty 50

## 2012-03-20 MED ORDER — ZOLPIDEM TARTRATE 5 MG PO TABS: 5.0000 mg | ORAL_TABLET | Freq: Every evening | ORAL | Status: DC | PRN

## 2012-03-20 MED ORDER — ACETAMINOPHEN 325 MG PO TABS: 650.0000 mg | ORAL_TABLET | ORAL | Status: DC | PRN

## 2012-03-20 MED ORDER — ONDANSETRON HCL 4 MG/2ML IJ SOLN
4.0000 mg | Freq: Once | INTRAMUSCULAR | Status: AC
  Administered 2012-03-20: 4 mg via INTRAVENOUS
  Filled 2012-03-20: qty 2

## 2012-03-20 MED ORDER — MORPHINE SULFATE 4 MG/ML IJ SOLN
4.0000 mg | INTRAMUSCULAR | Status: DC | PRN
  Administered 2012-03-20: 4 mg via INTRAVENOUS
  Filled 2012-03-20: qty 1

## 2012-03-20 MED ORDER — ONDANSETRON HCL 4 MG/2ML IJ SOLN
4.0000 mg | Freq: Four times a day (QID) | INTRAMUSCULAR | Status: DC | PRN
  Administered 2012-03-21: 4 mg via INTRAVENOUS
  Filled 2012-03-20: qty 2

## 2012-03-20 MED ORDER — MORPHINE SULFATE 4 MG/ML IJ SOLN
4.0000 mg | Freq: Once | INTRAMUSCULAR | Status: AC
  Administered 2012-03-20: 4 mg via INTRAVENOUS
  Filled 2012-03-20: qty 1

## 2012-03-20 NOTE — ED Provider Notes (Signed)
History     CSN: [id number]  Arrival date & time 03/20/12  1629   First MD Initiated Contact with Patient 03/20/12 1838      Chief Complaint  Patient presents with  . Cellulitis    (Consider location/radiation/quality/duration/timing/severity/associated sxs/prior treatment) The history is provided by the patient and the spouse.   66 year old, male, with a history of coronary artery disease, congestive heart failure, diabetes, hypertension, presents emergency department complaining of pain, redness and swelling in his right lower extremity.  These symptoms have been associated also with fevers and chills.  He denies nausea, or vomiting.  He denies cough, or shortness of breath, or chest pain. He has been seen by his primary care physician, for this and was diagnosed with cellulitis.  He had been treated with oral antibiotics, but he has not been getting better.  He states that he had an ultrasound of his lower extremity and there was no DVT.  Past Medical History  Diagnosis Date  . 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 DM2 (diabetes mellitus, type 2)     insulin requiring  . Diabetes mellitus with neuropathy   . OSA (obstructive sleep apnea)   . HTN (hypertension)   . CHF (congestive heart failure)   . Kidney stones   . Dyslipidemia   . Metabolic syndrome   . Arthritis   . GERD (gastroesophageal reflux disease)   . Heart disease   . High cholesterol   . Kidney disease   . Colon polyps   . Thyroid disorder   . Recurrent UTI   . 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 Adrenal gland anomaly     Past Surgical History  Procedure Date  . Adrenal adenoma removal 1990-    right  . Coronary angioplasty with stent placement 2007    x 2  . Knee surgery     right  . Lung biopsy   . Carpal tunnel release     bilateral    Family History  Problem Relation Age of Onset  . Heart  attack Father   . Heart attack Mother   . Arthritis Maternal Grandmother   . Breast cancer Maternal Aunt   . Diabetes Maternal Aunt   . Hypertension Other   . Heart attack Brother   . Hypertension Maternal Aunt   . COPD Sister     History  Substance Use Topics  . Smoking status: Never Smoker   . Smokeless tobacco: Never Used  . Alcohol Use: No      Review of Systems  Constitutional: Positive for fever and chills. Negative for diaphoresis.  Respiratory: Negative for cough and shortness of breath.   Cardiovascular: Positive for leg swelling. Negative for chest pain.  Gastrointestinal: Negative for vomiting and abdominal pain.  Musculoskeletal: Negative for back pain.  Skin: Positive for rash.  Neurological: Negative for headaches.  Hematological: Does not bruise/bleed easily.  Psychiatric/Behavioral: Negative for confusion.  All other systems reviewed and are negative.    Allergies  Calcium channel blockers  Home Medications   Current Outpatient Rx  Name Route Sig Dispense Refill  . AMITRIPTYLINE HCL 50 MG PO TABS Oral Take 1 tablet (50 mg total) by mouth at bedtime. 90 tablet 1  . AMLODIPINE BESYLATE 10 MG PO TABS Oral Take 1 tablet (10 mg total) by mouth daily. 30 tablet 11  .  AMOXICILLIN-POT CLAVULANATE 875-125 MG PO TABS Oral Take 1 tablet by mouth 2 (two) times daily. 20 tablet 0  . ASPIRIN 81 MG PO TABS Oral Take 81 mg by mouth 2 (two) times daily.      . ATORVASTATIN CALCIUM 80 MG PO TABS Oral Take 1 tablet (80 mg total) by mouth at bedtime. 90 tablet 3  . CARVEDILOL 25 MG PO TABS Oral Take 1 tablet by mouth Twice daily.    . CEPHALEXIN 500 MG PO CAPS Oral Take 1 tablet by mouth Twice daily.    Marland Kitchen CLONIDINE HCL 0.3 MG PO TABS Oral Take 1 tablet (0.3 mg total) by mouth 3 (three) times daily. 90 tablet 11  . CLOPIDOGREL BISULFATE 75 MG PO TABS  TAKE ONE TABLET BY MOUTH EVERY DAY 90 tablet 1  . DOXYCYCLINE HYCLATE 100 MG PO TABS Oral Take 1 tablet (100 mg total) by  mouth 2 (two) times daily. 20 tablet 0  . FINASTERIDE 5 MG PO TABS  Once daily    . FUROSEMIDE 80 MG PO TABS Oral Take 1 tablet (80 mg total) by mouth every other day. 45 tablet 3  . GABAPENTIN 800 MG PO TABS  1 tablet in the morning and 1 and 1/2 at bedtime 225 tablet 3  . INSULIN ASPART PROT & ASPART (70-30) 100 UNIT/ML [place] SUSP  75 units in AM, 25 units at dinner. Disp 9 vials 9 mL 3  . ISOSORBIDE MONONITRATE ER 60 MG PO TB24 Oral Take 1 tablet (60 mg total) by mouth 2 (two) times daily. 180 tablet 2  . LISINOPRIL 40 MG PO TABS  TAKE ONE TABLET BY MOUTH EVERY DAY 30 tablet 5  . NITROGLYCERIN 0.4 MG SL SUBL Sublingual Place 0.4 mg under the tongue every 5 (five) minutes as needed. May repeat x3      . OMEPRAZOLE 20 MG PO CPDR Oral Take 20 mg by mouth daily.      . SULFAMETHOXAZOLE-TMP DS 800-160 MG PO TABS Oral Take 1 tablet by mouth Twice daily.    Marland Kitchen TAMSULOSIN HCL 0.4 MG PO CAPS Oral Take 0.4 mg by mouth daily. Once daily    . TRIAMTERENE-HCTZ 37.5-25 MG PO TABS Oral Take 1 tablet by mouth daily.    Marland Kitchen VALSARTAN-HYDROCHLOROTHIAZIDE 320-25 MG PO TABS Oral Take 0.5 tablets by mouth daily. If your bp is elevated on just the clonidine 30 tablet 6    BP 166/62  Pulse 61  Temp 97.9 F (36.6 C)  Resp 20  SpO2 98%  Physical Exam  Nursing note and vitals reviewed. Constitutional: He is oriented to person, place, and time. No distress.       Morbidly obese  HENT:  Head: Normocephalic and atraumatic.  Eyes: Conjunctivae normal are normal.  Neck: Normal range of motion.  Cardiovascular: Normal rate, regular rhythm and intact distal pulses.   No murmur heard. Pulmonary/Chest: Effort normal and breath sounds normal.  Abdominal: Soft. There is no tenderness.  Musculoskeletal: Normal range of motion. He exhibits edema and tenderness.       Right calf is swollen, red and tender from the foot to just below the knee.  Distal pulses are intact.  The erythema is circumferential  Neurological: He is  alert and oriented to person, place, and time.  Skin: Skin is warm and dry. There is erythema.       Circumferential redness from just below.  The knee to the foot, on the right side  Psychiatric: He has  a normal mood and affect. Thought content normal.    ED Course  Procedures (including critical care time) 66 year old, morbidly obese, male, with right lower extremity pain, and swelling and redness.  For several days, accompanied by shaking, chills, and fever.  He has failed outpatient treatment with oral antibiotics.  We will perform laboratory testing, and give IV antibiotics for lower extremity cellulitis  Labs Reviewed  CBC WITH DIFFERENTIAL - Abnormal; Notable for the following:    Hemoglobin 12.4 (*)     HCT 36.4 (*)     Neutrophils Relative 78 (*)     Neutro Abs 8.0 (*)     All other components within normal limits  BASIC METABOLIC PANEL - Abnormal; Notable for the following:    Glucose, Bld 179 (*)     Creatinine, Ser 1.54 (*)     GFR calc non Af Amer 46 (*)     GFR calc Af Amer 53 (*)     All other components within normal limits  GLUCOSE, CAPILLARY - Abnormal; Notable for the following:    Glucose-Capillary 159 (*)     All other components within normal limits   No results found.   No diagnosis found.    MDM  Right lower extremity cellulitis.  Negative lower extremity ultrasound performed as an outpatient by his PCP Renal insufficiency        Barbara Cower, MD 03/20/12 [phone]

## 2012-03-20 NOTE — Progress Notes (Signed)
R leg cellulitis.  Here for f/u.  Inc in redness, pain, swelling and also has chills.  Has neg u/s for DVT.  We discussed options.  Was prev given rocephin, is on doxy/augmentin.    Meds, vitals, and allergies reviewed.   ROS: See HPI.  Otherwise, noncontributory.  nad but uncomfortable On O2, baseline R shin puffy, red and tender with inc edema compared to prev (I had seen pt with Dr. Lorelei Pont 2 days ago).

## 2012-03-20 NOTE — ED Notes (Signed)
Pt updated on POC. PT to be moved to CDU; snack given.

## 2012-03-20 NOTE — ED Provider Notes (Signed)
Pt to move to CDU under observation, cellulitis protocol.  Sign out received from Dr Vanessa Kick. Pt with RLE cellulitis, treated by PCP outpatient without improvement.  Pt has had negative doppler US.  Plan is for cellulitis protocol overnight, treating with clindamycin.    Per epic charts from primary care provider, pt was initially treated with keflex, then switched to augmentin and bactrim, then switched to augmentin and doxycycline.  Also treated in office with Rocephin.  9:33 PM Patient seen and examined, line drawn around cellulitis by me.  Pt reports improvement of pain.  Has already gotten first dose of clindamycin.  No needs at this time.  Right lower extremity with erythema, edema, warmth, tenderness.  Distal pulses intact.  Plan is for pt to remain in CDU overnight, reassessed in the morning.    11:10 PM Patient requests pain medication.  States pain is 8/10.  Last pain medication dose was 4 hours ago.  Erythema remains within drawn lines.  Nurse alerted to need for medications.  I have placed PRN orders for the patient.     Patient is in CDU under observation, cellulitis protocol.  Pt is receiving clindamycin, has two more doses scheduled at 0115 hrs and 0915 hrs. Plan is for reassessment in the morning.    Pt signed out to [facility] overnight doctor - Dr Claybon Jabs, [place] 03/20/12 2334

## 2012-03-20 NOTE — Assessment & Plan Note (Addendum)
D/w pt, likely failure of outpatient tx.  Will send to [facility] ER.  I have called ER about pending arrival.  Wife can drive pt.  Will likely need admission for inpatient treatment of cellulitis.

## 2012-03-20 NOTE — Patient Instructions (Addendum)
Go to the ER at [facility].  Tell them I sent you.  Take care.

## 2012-03-20 NOTE — ED Notes (Addendum)
Pt reports noticing redness to R leg, has been to PCP for, and has been on abx with it not getting any better; sent here for IV abx; pt was checked for blood clot in leg which came back negative; pt with redness, warmth to R leg ; reports some fevers

## 2012-03-21 LAB — GLUCOSE, CAPILLARY: Glucose-Capillary: 216 mg/dL — ABNORMAL HIGH (ref 70–99)

## 2012-03-21 MED ORDER — AMLODIPINE BESYLATE 10 MG PO TABS
10.0000 mg | ORAL_TABLET | Freq: Every day | ORAL | Status: DC
  Administered 2012-03-21: 10 mg via ORAL
  Filled 2012-03-21: qty 1

## 2012-03-21 MED ORDER — HYDROCODONE-ACETAMINOPHEN 5-500 MG PO TABS: 1.0000 | ORAL_TABLET | Freq: Four times a day (QID) | ORAL | Status: DC | PRN

## 2012-03-21 MED ORDER — FUROSEMIDE 20 MG PO TABS
80.0000 mg | ORAL_TABLET | ORAL | Status: DC
  Administered 2012-03-21: 80 mg via ORAL
  Filled 2012-03-21: qty 4

## 2012-03-21 MED ORDER — LISINOPRIL 20 MG PO TABS
40.0000 mg | ORAL_TABLET | Freq: Every day | ORAL | Status: DC
  Administered 2012-03-21: 40 mg via ORAL
  Filled 2012-03-21: qty 2

## 2012-03-21 MED ORDER — INSULIN ASPART PROT & ASPART (70-30 MIX) 100 UNIT/ML ~~LOC~~ SUSP
75.0000 [IU] | Freq: Every day | SUBCUTANEOUS | Status: DC
  Administered 2012-03-21: 75 [IU] via SUBCUTANEOUS
  Filled 2012-03-21: qty 3

## 2012-03-21 MED ORDER — CLINDAMYCIN HCL 300 MG PO CAPS: 300.0000 mg | ORAL_CAPSULE | Freq: Three times a day (TID) | ORAL | Status: DC

## 2012-03-21 MED ORDER — CLOPIDOGREL BISULFATE 75 MG PO TABS
75.0000 mg | ORAL_TABLET | Freq: Every day | ORAL | Status: DC
  Administered 2012-03-21: 75 mg via ORAL
  Filled 2012-03-21: qty 1

## 2012-03-21 MED ORDER — ISOSORBIDE MONONITRATE ER 60 MG PO TB24
60.0000 mg | ORAL_TABLET | Freq: Two times a day (BID) | ORAL | Status: DC
  Administered 2012-03-21: 60 mg via ORAL
  Filled 2012-03-21: qty 1

## 2012-03-21 MED ORDER — CLONIDINE HCL 0.2 MG PO TABS
0.3000 mg | ORAL_TABLET | Freq: Three times a day (TID) | ORAL | Status: DC
  Administered 2012-03-21: 0.3 mg via ORAL
  Filled 2012-03-21: qty 3

## 2012-03-21 NOTE — ED Provider Notes (Signed)
Medical screening examination/treatment/procedure(s) were conducted as a shared visit with non-physician practitioner(s) and myself.  I personally evaluated the patient during the encounter  Barbara Cower, MD 03/21/12 [phone]

## 2012-03-21 NOTE — ED Notes (Signed)
Discharge iv.before discharge to go home.

## 2012-03-21 NOTE — Progress Notes (Signed)
Utilization review completed.  

## 2012-03-21 NOTE — ED Provider Notes (Signed)
8:13 AM PT seen and examined by me in CDU. Pt with right lower leg cellulitis, states has had it for a week. Has been on several different antibiotics, not improving. Pt watched over night in CDU with IV antibiotics administered. PT had negative doppler of that leg. Pt states leg is not worsening.   Exam:  Pt in NAD. Lungs clear to auscultation, regular HR and rhythm. Right lower leg erythemous from about mid foot up to just below the knee. Area is marked with a permanent marker, erythema does not extend beyond the marked area. Leg is warm to the touch, tender.   Will continue to monitor. Pt to receive another dose of clindamycin this am.   10:14 AM Pt received another dose of clindamycin this AM. Pt feeling better. He is afebrile, non toxic. Right lower leg cellulitis is improving, no erythema past marked lines. He wants to go home. Will d/c home with pain medications and clindamycin at home. Follow up closely with PCP in 2 days. Instructed to return if worsening. BP elevated, has not received bp meds for this morning just given. Will need close recheck.  Filed Vitals:   03/21/12 0804  BP: 179/60  Pulse: 66  Temp: 98.2 F (36.8 C)  Resp: 20     Tyquez Hollibaugh A Shericka Johnstone, PA 03/21/12 1016

## 2012-03-22 NOTE — ED Provider Notes (Signed)
Medical screening examination/treatment/procedure(s) were performed by non-physician practitioner and as supervising physician I was immediately available for consultation/collaboration.   Delora Fuel, MD [id number]

## 2012-03-25 ENCOUNTER — Encounter: Payer: Self-pay | Admitting: Family Medicine

## 2012-03-25 ENCOUNTER — Ambulatory Visit (INDEPENDENT_AMBULATORY_CARE_PROVIDER_SITE_OTHER): Payer: Medicare PPO | Admitting: Family Medicine

## 2012-03-25 VITALS — BP 128/80 | HR 60 | Temp 98.3°F | Wt 287.0 lb

## 2012-03-25 DIAGNOSIS — L0291 Cutaneous abscess, unspecified: Secondary | ICD-10-CM

## 2012-03-25 DIAGNOSIS — L0390 Cellulitis, unspecified: Secondary | ICD-10-CM

## 2012-03-25 DIAGNOSIS — E119 Type 2 diabetes mellitus without complications: Secondary | ICD-10-CM

## 2012-03-25 LAB — HEMOGLOBIN A1C: Hgb A1c MFr Bld: 10.5 % — ABNORMAL HIGH (ref 4.6–6.5)

## 2012-03-25 LAB — CBC WITH DIFFERENTIAL/PLATELET
Basophils Absolute: 0 10*3/uL (ref 0.0–0.1)
Basophils Relative: 0.3 % (ref 0.0–3.0)
Eosinophils Absolute: 0.2 10*3/uL (ref 0.0–0.7)
Eosinophils Relative: 2.8 % (ref 0.0–5.0)
HCT: 35.3 % — ABNORMAL LOW (ref 39.0–52.0)
Hemoglobin: 11.5 g/dL — ABNORMAL LOW (ref 13.0–17.0)
Lymphocytes Relative: 16.2 % (ref 12.0–46.0)
Lymphs Abs: 1.3 10*3/uL (ref 0.7–4.0)
MCHC: 32.7 g/dL (ref 30.0–36.0)
MCV: 86.7 fl (ref 78.0–100.0)
Monocytes Absolute: 0.7 10*3/uL (ref 0.1–1.0)
Monocytes Relative: 9 % (ref 3.0–12.0)
Neutro Abs: 5.6 10*3/uL (ref 1.4–7.7)
Neutrophils Relative %: 71.7 % (ref 43.0–77.0)
Platelets: 297 10*3/uL (ref 150.0–400.0)
RBC: 4.07 Mil/uL — ABNORMAL LOW (ref 4.22–5.81)
RDW: 14.2 % (ref 11.5–14.6)
WBC: 7.8 10*3/uL (ref 4.5–10.5)

## 2012-03-25 MED ORDER — HYDROCODONE-ACETAMINOPHEN 5-500 MG PO TABS: 1.0000 | ORAL_TABLET | Freq: Four times a day (QID) | ORAL | Status: DC | PRN

## 2012-03-25 NOTE — Patient Instructions (Addendum)
We'll contact you with your lab report.  Finish the antibiotics and take the pain medicine as needed (it can make you constipated and drowsy- take a stool softener as needed).  Notify us with an update later in the week.  Take care.

## 2012-03-26 LAB — LIPID PANEL
Cholesterol: 120 mg/dL (ref 0–200)
HDL: 23.2 mg/dL — ABNORMAL LOW (ref >39.00)
LDL Cholesterol: 73 mg/dL (ref 0–99)
Total CHOL/HDL Ratio: 5
Triglycerides: 121 mg/dL (ref 0.0–149.0)
VLDL: 24.2 mg/dL (ref 0.0–40.0)

## 2012-03-26 LAB — COMPREHENSIVE METABOLIC PANEL
ALT: 23 U/L (ref 0–53)
AST: 18 U/L (ref 0–37)
Albumin: 2.4 g/dL — ABNORMAL LOW (ref 3.5–5.2)
Alkaline Phosphatase: 70 U/L (ref 39–117)
BUN: 28 mg/dL — ABNORMAL HIGH (ref 6–23)
CO2: 24 mEq/L (ref 19–32)
Calcium: 8.1 mg/dL — ABNORMAL LOW (ref 8.4–10.5)
Chloride: 107 mEq/L (ref 96–112)
Creatinine, Ser: 1.7 mg/dL — ABNORMAL HIGH (ref 0.4–1.5)
GFR: 43.03 mL/min — ABNORMAL LOW (ref >60.00)
Glucose, Bld: 213 mg/dL — ABNORMAL HIGH (ref 70–99)
Potassium: 4.4 mEq/L (ref 3.5–5.1)
Sodium: 138 mEq/L (ref 135–145)
Total Bilirubin: 0.6 mg/dL (ref 0.3–1.2)
Total Protein: 6.5 g/dL (ref 6.0–8.3)

## 2012-03-26 NOTE — Assessment & Plan Note (Signed)
Continue clinda orally, he'll notify me with update later in the week.  He agrees. Okay for outpatient f/u.  Will check f/u labs today.

## 2012-03-26 NOTE — Progress Notes (Signed)
Hospital f/u.  Was given IV clinda, started on oral clinda.  Still with pain and erythema in R leg, but improved.  Better with elevation.  Compliant with meds. No fevers.    Meds, vitals, and allergies reviewed.   ROS: See HPI.  Otherwise, noncontributory.  nad On O2 at baseline Obese R shin with dec in erythema and tenderness but overall improved from last exam

## 2012-03-29 ENCOUNTER — Telehealth: Payer: Self-pay | Admitting: Family Medicine

## 2012-03-29 NOTE — Telephone Encounter (Signed)
As long as the redness isn't worse, then continue as is.  Keep the leg elevated for the swelling.  Thanks for the update.

## 2012-03-29 NOTE — Telephone Encounter (Signed)
Patient advised.

## 2012-03-29 NOTE — Telephone Encounter (Signed)
Caller: Rogen/Patient; Phone: [phone]; Reason for Call: Pt advises he was told to call Dr [name] with update on his right leg; still swollen but redness is not spreading.  Please call to discuss.

## 2012-04-01 ENCOUNTER — Other Ambulatory Visit: Payer: Self-pay | Admitting: Family Medicine

## 2012-04-01 NOTE — Telephone Encounter (Signed)
The pt called the triage line hoping to get a refill of clindamycin 300mg  sent to the [facility] in [place]. His callback - [phone]   Thanks!

## 2012-04-01 NOTE — Telephone Encounter (Signed)
Don't refill this.  Will see him tomorrow and we can decide about continuing it.  I asked for 85min OV.  He was given 15.  Move the appointment into a 10min slot.  I'll need all of the time.

## 2012-04-01 NOTE — Telephone Encounter (Signed)
Moved appt.  Patient notified.

## 2012-04-02 ENCOUNTER — Ambulatory Visit: Payer: Medicare PPO | Admitting: Family Medicine

## 2012-04-02 ENCOUNTER — Ambulatory Visit (INDEPENDENT_AMBULATORY_CARE_PROVIDER_SITE_OTHER): Payer: Medicare PPO | Admitting: Family Medicine

## 2012-04-02 ENCOUNTER — Encounter: Payer: Self-pay | Admitting: Family Medicine

## 2012-04-02 VITALS — BP 142/84 | HR 67 | Temp 97.6°F | Wt 289.8 lb

## 2012-04-02 DIAGNOSIS — E1142 Type 2 diabetes mellitus with diabetic polyneuropathy: Secondary | ICD-10-CM

## 2012-04-02 DIAGNOSIS — R7989 Other specified abnormal findings of blood chemistry: Secondary | ICD-10-CM

## 2012-04-02 DIAGNOSIS — N189 Chronic kidney disease, unspecified: Secondary | ICD-10-CM

## 2012-04-02 DIAGNOSIS — E119 Type 2 diabetes mellitus without complications: Secondary | ICD-10-CM

## 2012-04-02 DIAGNOSIS — L0291 Cutaneous abscess, unspecified: Secondary | ICD-10-CM

## 2012-04-02 DIAGNOSIS — R799 Abnormal finding of blood chemistry, unspecified: Secondary | ICD-10-CM

## 2012-04-02 DIAGNOSIS — L0390 Cellulitis, unspecified: Secondary | ICD-10-CM

## 2012-04-02 DIAGNOSIS — E1149 Type 2 diabetes mellitus with other diabetic neurological complication: Secondary | ICD-10-CM

## 2012-04-02 LAB — BASIC METABOLIC PANEL
BUN: 23 mg/dL (ref 6–23)
CO2: 30 mEq/L (ref 19–32)
Calcium: 8.6 mg/dL (ref 8.4–10.5)
Chloride: 106 mEq/L (ref 96–112)
Creatinine, Ser: 1.8 mg/dL — ABNORMAL HIGH (ref 0.4–1.5)
GFR: 40.04 mL/min — ABNORMAL LOW (ref >60.00)
Glucose, Bld: 73 mg/dL (ref 70–99)
Potassium: 4 mEq/L (ref 3.5–5.1)
Sodium: 141 mEq/L (ref 135–145)

## 2012-04-02 MED ORDER — INSULIN ASPART PROT & ASPART (70-30 MIX) 100 UNIT/ML ~~LOC~~ SUSP: SUBCUTANEOUS | Status: DC

## 2012-04-02 NOTE — Patient Instructions (Addendum)
Go to the lab on the way out.  We'll contact you with your lab report.  Increase the AM insulin to 80 units.  Take 30 at night.  If you have lows in the morning, then cut the PM dose back to 25 units.   Cancel the upcoming appointments.  I do want to recheck your A1c in 3 months at a lab visit.   Stay off the antibiotics for now and let me know if the leg pain/redness increases.

## 2012-04-03 NOTE — Assessment & Plan Note (Addendum)
Improved, continue to elevated leg and he'll notify me more redness.  Would restart clinda at that point.  >25 min spent with face to face with patient, >50% counseling and/or coordinating care

## 2012-04-03 NOTE — Assessment & Plan Note (Signed)
Avoid actos and metformin due to other conditions.  Can't afford other meds ie januvia and glipizide unlikely to be useful.  Would inc AM and PM 70/30 dose and he'll cut back PM dose if he has AM lows.  He agrees.

## 2012-04-03 NOTE — Assessment & Plan Note (Signed)
Refer to renal.  See notes on labs.

## 2012-04-03 NOTE — Progress Notes (Signed)
R leg edema andpain improved but not fully resolved.  Dec in erythema is marked.  No fevers.  Done with clinda.  Sugars are still diffusely high with A1c elevated.  Labs d/w pt.    Am sugars up to 170, up to 250 before meals later in the day.  Compliant with meds.    Meds, vitals, and allergies reviewed.   ROS: See HPI.  Otherwise, noncontributory.  nad On O2 at baseline rrr Ctab, no wheeze, but exam limited by habitus abd soft, obese R leg with some edema but overall improved and no erythema Some flaking skin noted on R shin

## 2012-04-14 ENCOUNTER — Telehealth: Payer: Self-pay | Admitting: Family Medicine

## 2012-04-14 NOTE — Telephone Encounter (Signed)
Late entry, call from renal clinic.  Would like endo referral.  I support this.  Renal will make referral.  App help of all involved.

## 2012-04-21 ENCOUNTER — Other Ambulatory Visit: Payer: Self-pay | Admitting: Family Medicine

## 2012-04-21 ENCOUNTER — Encounter: Payer: Self-pay | Admitting: Family Medicine

## 2012-04-21 MED ORDER — HYDRALAZINE HCL 10 MG PO TABS: 10.0000 mg | ORAL_TABLET | Freq: Three times a day (TID) | ORAL | Status: DC

## 2012-04-22 ENCOUNTER — Other Ambulatory Visit: Payer: Medicare PPO

## 2012-04-29 ENCOUNTER — Ambulatory Visit: Payer: Medicare PPO | Admitting: Family Medicine

## 2012-05-06 ENCOUNTER — Telehealth: Payer: Self-pay

## 2012-05-06 NOTE — Telephone Encounter (Signed)
Pt said cellulitis in rt leg has never gone away since seen in Sept. Pt said still red on calf of rt leg and foot; foot is swollen and feels warm to touch, feels like pins sticking him. [facility] [place],Please advise.

## 2012-05-06 NOTE — Telephone Encounter (Signed)
Needs to be see, either tonight or early tomorrow AM.  If progressive, to ER.

## 2012-05-06 NOTE — Telephone Encounter (Signed)
Patient notified as instructed by telephone. Pt said about the same as when seen in 03/2012. Pt wanted appt tomorrow at 2 pm. Pt scheduled and advised if condition worsens or changes to go to ER tonight.pt voiced understanding.

## 2012-05-07 ENCOUNTER — Encounter: Payer: Self-pay | Admitting: Family Medicine

## 2012-05-07 ENCOUNTER — Ambulatory Visit (INDEPENDENT_AMBULATORY_CARE_PROVIDER_SITE_OTHER): Payer: Medicare PPO | Admitting: Family Medicine

## 2012-05-07 VITALS — BP 160/80 | HR 84 | Temp 98.5°F | Wt 274.0 lb

## 2012-05-07 DIAGNOSIS — L0390 Cellulitis, unspecified: Secondary | ICD-10-CM

## 2012-05-07 DIAGNOSIS — L0291 Cutaneous abscess, unspecified: Secondary | ICD-10-CM

## 2012-05-07 LAB — CBC WITH DIFFERENTIAL/PLATELET
Basophils Absolute: 0 10*3/uL (ref 0.0–0.1)
Basophils Relative: 0.4 % (ref 0.0–3.0)
Eosinophils Absolute: 0.2 10*3/uL (ref 0.0–0.7)
Eosinophils Relative: 1.9 % (ref 0.0–5.0)
HCT: 40.6 % (ref 39.0–52.0)
Hemoglobin: 13.3 g/dL (ref 13.0–17.0)
Lymphocytes Relative: 14.1 % (ref 12.0–46.0)
Lymphs Abs: 1.3 10*3/uL (ref 0.7–4.0)
MCHC: 32.7 g/dL (ref 30.0–36.0)
MCV: 86 fl (ref 78.0–100.0)
Monocytes Absolute: 0.6 10*3/uL (ref 0.1–1.0)
Monocytes Relative: 6.7 % (ref 3.0–12.0)
Neutro Abs: 7.3 10*3/uL (ref 1.4–7.7)
Neutrophils Relative %: 76.9 % (ref 43.0–77.0)
Platelets: 228 10*3/uL (ref 150.0–400.0)
RBC: 4.71 Mil/uL (ref 4.22–5.81)
RDW: 15.9 % — ABNORMAL HIGH (ref 11.5–14.6)
WBC: 9.5 10*3/uL (ref 4.5–10.5)

## 2012-05-07 MED ORDER — CLINDAMYCIN HCL 300 MG PO CAPS: 300.0000 mg | ORAL_CAPSULE | Freq: Three times a day (TID) | ORAL | Status: DC

## 2012-05-07 NOTE — Progress Notes (Signed)
R lower leg cellulitis.  H/o similar prev.  No fevers but has had chills. R leg increasingly sore over last 48 hours. Had been off leg last 2 days to help with edema.  Pain is consistent over last 2 days.    Sugar has been 130-140 in AM, that is improved.  He hasn't had endo f/u yet.    Meds, vitals, and allergies reviewed.   ROS: See HPI.  Otherwise, noncontributory.  nad On O2, baseline rrr R leg with inc in tenderness, mild circumferential erythema, no focal fluctuant mass.

## 2012-05-07 NOTE — Patient Instructions (Addendum)
Go to the lab on the way out.  We'll contact you with your lab report. Start back on the clindamycin and call back with an update in 2 days.  If you have a fever, then go to the ER.

## 2012-05-08 NOTE — Assessment & Plan Note (Signed)
See notes on labs.  Restart clinda, elevate leg, if progressive to ER.  Okay for outpt f/u.  He was prev checked for DVT with similar presentation.  He'll call back with update.  D/w pt about glucose control.  He understood.

## 2012-05-09 ENCOUNTER — Telehealth: Payer: Self-pay

## 2012-05-09 NOTE — Telephone Encounter (Signed)
I called him back.  No fever, slight dec in pain.  Taking abx.  Will continue as is with elevation and abx.  If fever, to ER.  O/w call back with update on Monday, sooner prn.  He agrees.

## 2012-05-09 NOTE — Telephone Encounter (Signed)
pt left v/m leg appears same as when seen 05/07/12.Please advise.

## 2012-05-13 NOTE — Telephone Encounter (Signed)
Patient advised. Appt scheduled. 

## 2012-05-13 NOTE — Telephone Encounter (Signed)
Noted, thanks.  Keep leg up, finish the abx.  Thanks.  I want to see him when the abx are done- the day he finishes the abx.

## 2012-05-13 NOTE — Telephone Encounter (Signed)
Pt said leg slightly better today; not as red, pain level better unless press on leg and no fever. Pt taking antibiotic.Please advise.

## 2012-05-17 ENCOUNTER — Encounter: Payer: Self-pay | Admitting: Family Medicine

## 2012-05-17 ENCOUNTER — Ambulatory Visit (INDEPENDENT_AMBULATORY_CARE_PROVIDER_SITE_OTHER): Payer: Medicare PPO | Admitting: Family Medicine

## 2012-05-17 VITALS — BP 130/80 | HR 70 | Temp 97.5°F | Wt 279.8 lb

## 2012-05-17 DIAGNOSIS — L0390 Cellulitis, unspecified: Secondary | ICD-10-CM

## 2012-05-17 DIAGNOSIS — Z23 Encounter for immunization: Secondary | ICD-10-CM

## 2012-05-17 NOTE — Patient Instructions (Signed)
Charge as RN visit.  Keep your leg up and get back in the compression stockings.   Finish the antibiotics.   You right leg is likely going to be bigger than your left, but the pain should not come back.  If it does then notify us.   Take care.

## 2012-05-18 NOTE — Progress Notes (Signed)
No fevers, no chills. R calf pain and redness improved.  Less edema, but some still present.  2 doses of clinda left.  Compliant with meds.  Due for flu shot.   Meds, vitals, and allergies reviewed.   ROS: See HPI.  Otherwise, noncontributory.  nad On O2 at baseline R calf 48cm L calf 44 cm Less erythema in R calf, nearly back to normal coloration.  Still with 1-2+ edema in R calf but not ttp

## 2012-05-18 NOTE — Assessment & Plan Note (Signed)
Improved, likely was combination of infection and dependent edema. Use compression stockings, elevation, finish abx and f/u prn.  He agrees. Flu shot done today.

## 2012-06-05 ENCOUNTER — Telehealth: Payer: Self-pay

## 2012-06-05 DIAGNOSIS — E119 Type 2 diabetes mellitus without complications: Secondary | ICD-10-CM

## 2012-06-05 MED ORDER — INSULIN NPH ISOPHANE & REGULAR (70-30) 100 UNIT/ML ~~LOC~~ SUSP: SUBCUTANEOUS | Status: DC

## 2012-06-05 NOTE — Telephone Encounter (Signed)
Spoke to patient and was advised that he has not been referred to the endocrine clinic and did not know any thing about that. Patient states that money is tight and he is trying to cut back on co-pays.

## 2012-06-05 NOTE — Telephone Encounter (Signed)
He was referred to endocrine clinic.  This should come through that clinic if he has been seen there or if he will be seen there in the near future.  Let me know if that isn't the case.

## 2012-06-05 NOTE — Telephone Encounter (Signed)
He was to be referred by renal clinic.  I put in the referral to Dr. Gabriel Carina today.  I would still go see her.   I sent the rx for the insulin change. Use it 30 min before breakfast and dinner. Thanks.

## 2012-06-05 NOTE — Telephone Encounter (Signed)
Pt said Novolog 70-30 for 3 vials is  $265.00 out of pt's pocket. If Dr [name] can change to Reli-on 70/30 which is [facility] brand of insulin for Novolin 70-30 pt will only pay $ 60 out of pocket for 3 vials. Pharmacist said Novolog is fast acting and Novolin is not.Please advise.pt request med sent to [facility] and pt request call back.

## 2012-06-05 NOTE — Telephone Encounter (Signed)
Patient notified as instructed by telephone. Advised patient that Rosaria Ferries will be in touch with him to get this set up.

## 2012-06-19 ENCOUNTER — Other Ambulatory Visit: Payer: Self-pay | Admitting: *Deleted

## 2012-06-19 MED ORDER — AMITRIPTYLINE HCL 50 MG PO TABS: 50.0000 mg | ORAL_TABLET | Freq: Every day | ORAL | Status: DC

## 2012-06-19 NOTE — Telephone Encounter (Signed)
Sent!

## 2012-07-05 ENCOUNTER — Other Ambulatory Visit (INDEPENDENT_AMBULATORY_CARE_PROVIDER_SITE_OTHER): Payer: Medicare PPO

## 2012-07-05 DIAGNOSIS — E119 Type 2 diabetes mellitus without complications: Secondary | ICD-10-CM

## 2012-07-05 LAB — HEMOGLOBIN A1C: Hgb A1c MFr Bld: 8.1 % — ABNORMAL HIGH (ref 4.6–6.5)

## 2012-07-10 ENCOUNTER — Encounter: Payer: Self-pay | Admitting: Family Medicine

## 2012-07-10 ENCOUNTER — Ambulatory Visit (INDEPENDENT_AMBULATORY_CARE_PROVIDER_SITE_OTHER): Payer: Medicare PPO | Admitting: Family Medicine

## 2012-07-10 VITALS — BP 128/70 | HR 60 | Temp 98.1°F | Wt 279.2 lb

## 2012-07-10 DIAGNOSIS — L0291 Cutaneous abscess, unspecified: Secondary | ICD-10-CM

## 2012-07-10 DIAGNOSIS — L0390 Cellulitis, unspecified: Secondary | ICD-10-CM

## 2012-07-10 NOTE — Progress Notes (Signed)
H/o R leg cellulitis.   Still with R>L edema (with prev leg u/s done).  Still with some pain. Not as red or tender as prev.  Usually using ACE wraps or compression stockings.  No fevers. On amlodipine. Not on abx currently.   Meds, vitals, and allergies reviewed.   ROS: See HPI.  Otherwise, noncontributory.  nad On O2 rrr Ctab, global dec in BS noted Ext with 2-3+ edema, R>L. R shin is slightly ttp with slight pink discoloration on the inferior and lateral areas but w/o fluctuant mass.  This is improved from prev exams

## 2012-07-10 NOTE — Assessment & Plan Note (Signed)
This looks inflammatory and not infectious.  No fevers. On low salt diet.  Continue to elevate and compress leg with stocking/wraps.  I left message for Dr. Holley Raring about the amlodipine.  I didn't change meds today as his BP was controlled.  He has had prev u/s done and we don't need to repeat.  Pt agrees with plan.    Also he has f/u with endo at the endo of the month.  I'll defer to endo about DM2.  A1c d/w pt.

## 2012-07-10 NOTE — Patient Instructions (Addendum)
Don't change your meds for now.  Wear the stockings as much as possible and let me know if the pain gets worse.  I'll talk to the kidney clinic in the meantime about your meds.  Take care.

## 2012-07-12 ENCOUNTER — Telehealth: Payer: Self-pay | Admitting: Family Medicine

## 2012-07-12 MED ORDER — AMLODIPINE BESYLATE 10 MG PO TABS: 5.0000 mg | ORAL_TABLET | Freq: Every day | ORAL | Status: DC

## 2012-07-12 NOTE — Telephone Encounter (Signed)
Patient advised.

## 2012-07-12 NOTE — Telephone Encounter (Signed)
Notify pt. I talked to Dr. Holley Raring.  Would cut the amlodipine back to 5mg  a day.  See if that helps with the edema.  Thanks.

## 2012-07-19 ENCOUNTER — Ambulatory Visit (INDEPENDENT_AMBULATORY_CARE_PROVIDER_SITE_OTHER): Payer: Medicare PPO | Admitting: Cardiovascular Disease

## 2012-07-19 ENCOUNTER — Encounter: Payer: Self-pay | Admitting: Cardiovascular Disease

## 2012-07-19 VITALS — BP 156/80 | HR 55 | Ht 65.0 in | Wt 279.2 lb

## 2012-07-19 DIAGNOSIS — R079 Chest pain, unspecified: Secondary | ICD-10-CM

## 2012-07-19 DIAGNOSIS — L0390 Cellulitis, unspecified: Secondary | ICD-10-CM

## 2012-07-19 DIAGNOSIS — L0291 Cutaneous abscess, unspecified: Secondary | ICD-10-CM

## 2012-07-19 DIAGNOSIS — I2510 Atherosclerotic heart disease of native coronary artery without angina pectoris: Secondary | ICD-10-CM

## 2012-07-19 DIAGNOSIS — I10 Essential (primary) hypertension: Secondary | ICD-10-CM

## 2012-07-19 DIAGNOSIS — E1149 Type 2 diabetes mellitus with other diabetic neurological complication: Secondary | ICD-10-CM

## 2012-07-19 MED ORDER — HYDRALAZINE HCL 25 MG PO TABS: 25.0000 mg | ORAL_TABLET | Freq: Three times a day (TID) | ORAL | Status: DC

## 2012-07-19 MED ORDER — ISOSORBIDE MONONITRATE ER 60 MG PO TB24: 60.0000 mg | ORAL_TABLET | Freq: Two times a day (BID) | ORAL | Status: DC

## 2012-07-19 NOTE — Assessment & Plan Note (Signed)
Continued mild erythema and swelling of the right lower extremity. He reports it is much better than several months ago and has been stable. We will hold the amlodipine 5 mg to make sure that is not contributing to his lower extremity edema. We have recommended if symptoms get worse with increasing erythema and warmth, that he call the office or talk with Dr. [name].

## 2012-07-19 NOTE — Assessment & Plan Note (Signed)
We have encouraged continued exercise, careful diet management in an effort to lose weight. 

## 2012-07-19 NOTE — Assessment & Plan Note (Signed)
We have strongly encouraged him to watch his diet closely.

## 2012-07-19 NOTE — Assessment & Plan Note (Signed)
Recent episode of chest pain. We have recommended that he call us if symptoms recur. There was some atypical components though he is certainly high risk of additional stenoses.

## 2012-07-19 NOTE — Assessment & Plan Note (Signed)
We have suggested that he increase his hydralazine to 25 mg 3 times a day, hold his amlodipine for lower extremity swelling and continue to monitor his blood pressure.

## 2012-07-19 NOTE — Patient Instructions (Addendum)
You are doing well. Please hold amlodipine (to see if leg swelling gets better) Increase the hydralazine to 25 mg three times a day (take two of the hydralazine 10 mg pills until you run out, then get the 25 mg pills)  Please call us if you have new issues that need to be addressed before your next appt.  Your physician wants you to follow-up in: 6 months.  You will receive a reminder letter in the mail two months in advance. If you don't receive a letter, please call our office to schedule the follow-up appointment.

## 2012-07-19 NOTE — Progress Notes (Signed)
Patient ID: HESHY AFTAB, male    DOB: [birth year], 67 y.o.   MRN: [id number]  HPI Comments: Stephen [name] is Stephen very pleasant 67 year old [name] with Stephen history of coronary artery disease, PCI to the mid RCA and mid left circumflex in October 2007, interstitial fibrosis, with wedge resection, hypertension, hyperlipidemia, remote tobacco abuse, obesity, chronic substernal chest pain who presents for routine followup. History of poorly controlled diabetes  Open lung biopsy was performed November 2009 for bilateral pulmonary infiltrates. Stress test July 2011 was dobutamine stress that showed no significant ischemia, inadequate heart rate was achieved.   right and left heart catheterization January 2011 showed 50% mid LAD disease, 60% at the ostium of the diagonal #2, 30% proximal left circumflex disease, patent stent in the left circumflex, patent stent of the mid RCA, wedge pressure of 16, PA pressure mean 22, right ventricular pressure 41/8 .   echocardiogram September 2010 shows normal systolic function, diastolic dysfunction, normal RV function unable to evaluate right ventricular systolic pressures  Stephen [name] reports that overall Stephen [name] is doing well. Stephen [name] has had cellulitis of his right lower extremity with course of antibiotics several months ago. Residual warmth, mild redness to the right lower extremity since then but much improved from before. Blood pressures are well controlled at home systolic pressures in the 130 range. Last creatinine 1.7 which was up from 1.54. Shortness of breath improved, mild at baseline. Stephen [name] reports having an episode of chest pain lasting several minutes localized to his left chest last week stuttering for some of the night. No further episodes since that time. Felt somewhat different from his prior angina which radiated to the left shoulder and was more severe  EKG shows normal sinus rhythm with rate 55 beats per minute with poor R-wave progression to the anterior precordial  leads, consider old anterior MI, left axis deviation    Outpatient Encounter Prescriptions as of 07/19/2012  Medication Sig Dispense Refill  . amitriptyline (ELAVIL) 50 MG tablet Take 1 tablet (50 mg total) by mouth at bedtime.  90 tablet  1  . amLODipine (NORVASC) 10 MG tablet Take 0.5 tablets (5 mg total) by mouth daily.      Marland Kitchen aspirin 81 MG tablet Take 81 mg by mouth 2 (two) times daily.        Marland Kitchen atorvastatin (LIPITOR) 80 MG tablet Take 1 tablet (80 mg total) by mouth at bedtime.  90 tablet  3  . carvedilol (COREG) 25 MG tablet Take 1 tablet by mouth Twice daily.      . cloNIDine (CATAPRES) 0.3 MG tablet Take 1 tablet (0.3 mg total) by mouth 3 (three) times daily.  90 tablet  11  . clopidogrel (PLAVIX) 75 MG tablet TAKE ONE TABLET BY MOUTH EVERY DAY  90 tablet  1  . finasteride (PROSCAR) 5 MG tablet Once daily      . furosemide (LASIX) 80 MG tablet Take 1 tablet (80 mg total) by mouth every other day.  45 tablet  3  . gabapentin (NEURONTIN) 800 MG tablet 1 tablet in the morning and 1 and 1/2 at bedtime  225 tablet  3  . hydrALAZINE (APRESOLINE) 25 MG tablet Take 1 tablet (25 mg total) by mouth 3 (three) times daily.  270 tablet  3  . insulin NPH-insulin regular (NOVOLIN 70/30) (70-30) 100 UNIT/ML injection Inject into the skin. 75 units every AM and 30 units in the evening      . isosorbide mononitrate (IMDUR) 60 MG 24  hr tablet Take 1 tablet (60 mg total) by mouth 2 (two) times daily.  180 tablet  3  . nitroGLYCERIN (NITROSTAT) 0.4 MG SL tablet Place 0.4 mg under the tongue every 5 (five) minutes as needed. May repeat x3        . omeprazole (PRILOSEC) 20 MG capsule Take 20 mg by mouth daily.        . Tamsulosin HCl (FLOMAX) 0.4 MG CAPS Take 0.4 mg by mouth daily. Once daily      . triamterene-hydrochlorothiazide (MAXZIDE-25) 37.5-25 MG per tablet Take 1 tablet by mouth daily.        Review of Systems  Constitutional: Negative.   HENT: Negative.   Eyes: Negative.   Respiratory:  Positive for shortness of breath.   Cardiovascular: Positive for leg swelling.  Gastrointestinal: Negative.   Musculoskeletal: Negative.   Skin: Negative.   Neurological: Negative.   Hematological: Negative.   Psychiatric/Behavioral: Negative.   All other systems reviewed and are negative.   BP 156/80  Pulse 55  Ht 5\' 5"  (1.651 m)  Wt 279 lb 4 oz (126.667 kg)  BMI 46.47 kg/m2  Physical Exam  Nursing note and vitals reviewed. Constitutional: Stephen [name] is oriented to person, place, and time. Stephen [name] appears well-developed and well-nourished.       Morbidly obese.  HENT:  Head: Normocephalic.  Nose: Nose normal.  Mouth/Throat: Oropharynx is clear and moist.  Eyes: Conjunctivae normal are normal. Pupils are equal, round, and reactive to light.  Neck: Normal range of motion. Neck supple. No JVD present.  Cardiovascular: Normal rate, regular rhythm, S1 normal, S2 normal, normal heart sounds and intact distal pulses.  Exam reveals no gallop and no friction rub.   No murmur heard.      Trace to 1+ pitting edema of the right lower extremity with mild erythema diffusely and warmth  Pulmonary/Chest: Effort normal. No respiratory distress. Stephen [name] has no wheezes. Stephen [name] exhibits no tenderness.  Abdominal: Soft. Bowel sounds are normal. Stephen [name] exhibits no distension. There is no tenderness.  Musculoskeletal: Normal range of motion. Stephen [name] exhibits edema. Stephen [name] exhibits no tenderness.  Lymphadenopathy:    Stephen [name] has no cervical adenopathy.  Neurological: Stephen [name] is alert and oriented to person, place, and time. Coordination normal.  Skin: Skin is warm and dry. No rash noted. No erythema.  Psychiatric: Stephen [name] has Stephen normal mood and affect. His behavior is normal. Judgment and thought content normal.           Assessment and Plan

## 2012-07-23 ENCOUNTER — Ambulatory Visit (INDEPENDENT_AMBULATORY_CARE_PROVIDER_SITE_OTHER): Payer: Medicare PPO | Admitting: Pulmonary Disease

## 2012-07-23 ENCOUNTER — Encounter: Payer: Self-pay | Admitting: Pulmonary Disease

## 2012-07-23 VITALS — BP 114/60 | HR 61 | Temp 97.6°F | Ht 65.0 in | Wt 281.4 lb

## 2012-07-23 DIAGNOSIS — G4733 Obstructive sleep apnea (adult) (pediatric): Secondary | ICD-10-CM

## 2012-07-23 DIAGNOSIS — J8410 Pulmonary fibrosis, unspecified: Secondary | ICD-10-CM

## 2012-07-23 NOTE — Assessment & Plan Note (Addendum)
Kasey's objective findings today are unchanged compared to May 2012 studies. This is reassuring that there is no progression of his pulmonary fibrosis.   I do not believe that he needs to have the oxygen as he did not desaturate with walking today. Unfortunately he left today before we can discuss this fully. We will have to address it on the next visit.  Plan: -Continue exercise on a regular basis, I recommend 30 minutes daily -Obtain chest x-ray before the next visit  -I recommend that he lose at least 20-30 pounds the exercise -Continue to follow with every 6 month spirometry and 6 minute walk testing.

## 2012-07-23 NOTE — Patient Instructions (Signed)
Keep using your oxygen as you are doing with exertion Keep using your CPAP machine as you are doing Exercise regularly.  Try to walk at least 30 minutes daily. We will see you back in 6 months or sooner if needed

## 2012-07-23 NOTE — Progress Notes (Signed)
Subjective:    Patient ID: Stephen [name], male    DOB: 10-13-1945, 67 y.o.   MRN: [id number]  Synopsis: Nestor Lewandowsky as previously followed by Dr. 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 In 2009 he was evaluated for this at [facility] and had an open lung biopsy which was consistent with either metal dust exposure versus hypersensitivity pneumonitis. He was placed on 2 L of oxygen at that time.  HPI  07/23/2012 routine office visit>> Stephen [name] returns to clinic today stating that he is doing well. He did not followup with pulmonary rehabilitation because he says there is a $40 co-pay every time he has to go. He claims he has been exercising on a regular basis. He says that his shortness of breath is unchanged compared to 6 months ago. He continues to use his oxygen with exertion. He uses his CPAP machine every night. He has recently experienced some cellulitis but he says this is getting better. He has no cough. Otherwise he says that things are going well.  Past Medical History  Diagnosis Date  . 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 DM2 (diabetes mellitus, type 2)     insulin requiring  . Diabetes mellitus with neuropathy   . OSA (obstructive sleep apnea)   . HTN (hypertension)   . CHF (congestive heart failure)   . Kidney stones   . Dyslipidemia   . Metabolic syndrome   . Arthritis   . GERD (gastroesophageal reflux disease)   . Heart disease   . High cholesterol   . Kidney disease   . Colon polyps   . Thyroid disorder   . Recurrent UTI   . 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 Adrenal gland anomaly      Review of Systems  Constitutional: Negative for fever, chills and appetite change.  HENT: Negative for rhinorrhea, sneezing, postnasal drip and sinus pressure.   Respiratory: Positive for shortness of breath. Negative  for cough, chest tightness and wheezing.   Cardiovascular: Positive for leg swelling. Negative for chest pain and palpitations.       Objective:   Physical Exam  Filed Vitals:   07/23/12 0900  BP: 114/60  Pulse: 61  Temp: 97.6 F (36.4 C)  TempSrc: Oral  Height: 5\' 5"  (1.651 m)  Weight: 281 lb 6.4 oz (127.642 kg)  SpO2: 96%  2L [place] continuous  Gen: obese white male, no acute distress HEENT: NCAT, EOMi, thick neck supple without masses PULM: CTA B CV: RRR, distant heart sounds, cannot assess JVD due to body habitus AB: BS+, soft, nontender, no hsm Ext: warm, trace edema bilaterally, no clubbing, no cyanosis   2009 [facility] records including pathology report reviewed  January 2014 simple spirometry ratio normal, FVC 2.95 L (76% predicted January 2014 6 minute walk performed on room air: O2 saturation dropped no lower than 95%, 274 m walk     Assessment & Plan:   PULMONARY FIBROSIS Orien's objective findings today are unchanged compared to May 2012 studies. This is reassuring that there is no progression of his pulmonary fibrosis.   I do not believe that he needs to have the oxygen as he did not desaturate with walking today. Unfortunately he left today before we can discuss this fully. We will have to address it on  the next visit.  Plan: -Continue exercise on a regular basis, I recommend 30 minutes daily -Obtain chest x-ray before the next visit  -I recommend that he lose at least 20-30 pounds the exercise -Continue to follow with every 6 month spirometry and 6 minute walk testing.    SLEEP APNEA, OBSTRUCTIVE He is to continue on CPAP at 16 cm of water each bedtime.    Updated Medication List Outpatient Encounter Prescriptions as of 07/23/2012  Medication Sig Dispense Refill  . amitriptyline (ELAVIL) 50 MG tablet Take 1 tablet (50 mg total) by mouth at bedtime.  90 tablet  1  . aspirin 81 MG tablet Take 81 mg by mouth 2 (two) times daily.        Marland Kitchen atorvastatin  (LIPITOR) 80 MG tablet Take 1 tablet (80 mg total) by mouth at bedtime.  90 tablet  3  . carvedilol (COREG) 25 MG tablet Take 1 tablet by mouth Twice daily.      . cloNIDine (CATAPRES) 0.3 MG tablet Take 1 tablet (0.3 mg total) by mouth 3 (three) times daily.  90 tablet  11  . clopidogrel (PLAVIX) 75 MG tablet TAKE ONE TABLET BY MOUTH EVERY DAY  90 tablet  1  . finasteride (PROSCAR) 5 MG tablet Once daily      . furosemide (LASIX) 80 MG tablet Take 1 tablet (80 mg total) by mouth every other day.  45 tablet  3  . gabapentin (NEURONTIN) 800 MG tablet 1 tablet in the morning and 1 and 1/2 at bedtime  225 tablet  3  . hydrALAZINE (APRESOLINE) 25 MG tablet Take 1 tablet (25 mg total) by mouth 3 (three) times daily.  270 tablet  3  . insulin NPH-insulin regular (NOVOLIN 70/30) (70-30) 100 UNIT/ML injection Inject into the skin. 75 units every AM and 30 units in the evening      . isosorbide mononitrate (IMDUR) 60 MG 24 hr tablet Take 1 tablet (60 mg total) by mouth 2 (two) times daily.  180 tablet  3  . nitroGLYCERIN (NITROSTAT) 0.4 MG SL tablet Place 0.4 mg under the tongue every 5 (five) minutes as needed. May repeat x3        . omeprazole (PRILOSEC) 20 MG capsule Take 20 mg by mouth daily.        . Tamsulosin HCl (FLOMAX) 0.4 MG CAPS Take 0.4 mg by mouth daily. Once daily      . triamterene-hydrochlorothiazide (MAXZIDE-25) 37.5-25 MG per tablet Take 1 tablet by mouth daily.      . [DISCONTINUED] amLODipine (NORVASC) 10 MG tablet Take 0.5 tablets (5 mg total) by mouth daily.

## 2012-07-23 NOTE — Assessment & Plan Note (Signed)
He is to continue on CPAP at 16 cm of water each bedtime.

## 2012-08-05 ENCOUNTER — Encounter: Payer: Self-pay | Admitting: Family Medicine

## 2012-08-14 ENCOUNTER — Other Ambulatory Visit: Payer: Self-pay | Admitting: Cardiovascular Disease

## 2012-08-14 NOTE — Telephone Encounter (Signed)
Refilled Clopidogrel #90 R#3 and Clonidine #180 R#3 sent to [facility].

## 2012-08-15 ENCOUNTER — Other Ambulatory Visit: Payer: Self-pay | Admitting: Cardiovascular Disease

## 2012-09-06 ENCOUNTER — Ambulatory Visit: Payer: Medicare PPO | Admitting: Family Medicine

## 2012-09-27 ENCOUNTER — Ambulatory Visit (INDEPENDENT_AMBULATORY_CARE_PROVIDER_SITE_OTHER): Payer: Medicare PPO | Admitting: Family Medicine

## 2012-09-27 ENCOUNTER — Encounter: Payer: Self-pay | Admitting: Family Medicine

## 2012-09-27 VITALS — BP 150/98 | HR 69 | Temp 98.3°F | Wt 280.5 lb

## 2012-09-27 DIAGNOSIS — L821 Other seborrheic keratosis: Secondary | ICD-10-CM

## 2012-09-27 DIAGNOSIS — R600 Localized edema: Secondary | ICD-10-CM

## 2012-09-27 DIAGNOSIS — R609 Edema, unspecified: Secondary | ICD-10-CM

## 2012-09-27 NOTE — Patient Instructions (Signed)
Keep the bandage on your leg until Sunday.  Take it off Sunday AM and then try to get in the stockings. If you have significantly more pain in your leg, then take the bandage off.  Wash your scalp with soap and water.  It will likely blister up and then slough off.  If the area doesn't fully heal over smoothly, then let me know.

## 2012-09-29 DIAGNOSIS — L821 Other seborrheic keratosis: Secondary | ICD-10-CM | POA: Insufficient documentation

## 2012-09-29 DIAGNOSIS — R600 Localized edema: Secondary | ICD-10-CM | POA: Insufficient documentation

## 2012-09-29 NOTE — Assessment & Plan Note (Addendum)
Verbal informed consent obtained.  Treated x3 with liq N2 and tolerated well. Routine instructions given. We can treat again if needed, later on.

## 2012-09-29 NOTE — Progress Notes (Signed)
H/o R lower leg cellulitis.  This was prev treated but the swelling in the R leg never fully returned to baseline.  Now with an inc in edema on that leg and more discomfort. No fevers. The leg isn't red. Not more SOB than typical. Has had his BP meds adjusted by renal clinic.  He hasn't been able to use compression stockings.   Meds, vitals, and allergies reviewed.   ROS: See HPI.  Otherwise, noncontributory.  nad On O2 at baseline RLE with 2-3+ edema, L leg with trace edema at baseline RLE isn't red.  Is minimally ttp on the calf and shin.  No skin breakdown.  Scalp with small SK that isn't ulcerated noted.

## 2012-09-29 NOTE — Assessment & Plan Note (Signed)
No skin breakdown and normal DP pulse. This doesn't look infected. It looks mildly irritation, likely from tissue strain from edema.  Wrapped in coban here in the clinic and he'll try to leave that on for now.  Can remove sooner if needed. Will try to get back in compression stockings immediately after the coban is removed. F/u here prn.  He'll f/u with renal about his BP.

## 2012-10-28 ENCOUNTER — Other Ambulatory Visit: Payer: Self-pay | Admitting: *Deleted

## 2012-10-28 MED ORDER — CARVEDILOL 25 MG PO TABS: 25.0000 mg | ORAL_TABLET | Freq: Two times a day (BID) | ORAL | Status: DC

## 2012-11-12 ENCOUNTER — Encounter: Payer: Self-pay | Admitting: Family Medicine

## 2012-11-12 ENCOUNTER — Ambulatory Visit (INDEPENDENT_AMBULATORY_CARE_PROVIDER_SITE_OTHER): Payer: Medicare PPO | Admitting: Family Medicine

## 2012-11-12 VITALS — BP 180/88 | HR 88 | Temp 98.9°F

## 2012-11-12 DIAGNOSIS — R6883 Chills (without fever): Secondary | ICD-10-CM

## 2012-11-12 DIAGNOSIS — R0602 Shortness of breath: Secondary | ICD-10-CM

## 2012-11-12 DIAGNOSIS — R609 Edema, unspecified: Secondary | ICD-10-CM

## 2012-11-12 DIAGNOSIS — I10 Essential (primary) hypertension: Secondary | ICD-10-CM

## 2012-11-12 MED ORDER — TRAMADOL HCL 50 MG PO TABS: 50.0000 mg | ORAL_TABLET | Freq: Two times a day (BID) | ORAL | Status: DC | PRN

## 2012-11-12 NOTE — Progress Notes (Signed)
R leg is more swollen, esp at the ankle.  Chills but no fevers, going on for a few weeks.  BP is up in clinic but had been lower at home.  R leg is painful.  He can walk but with pain.  Not painful for the sheet to lay across the foot.  No sig change in the L leg recently.    He had wrapped the leg/used compression hose but the foot edema prevented continued use.  His SOB is at baseline.   Compliant with meds and O2.   Meds, vitals, and allergies reviewed.   ROS: See HPI.  Otherwise, noncontributory.  Nad, on O2, chronically but not acutely ill appearing, obese Mmm rrr No focal dec in BS, no inc in wob. Speaking in complete sentences.  abd soft Ext with 2 + R>L BLE edema.  Neither calf is red or hot.  He does have some small blisters forming on the R anterior shin DP pulses intact B

## 2012-11-12 NOTE — Patient Instructions (Addendum)
Stephen [name] should be in touch with you about seeing the vascular clinic.  Keep your leg up as much as tolerated.   We'll contact you with your lab report. Take care.

## 2012-11-13 ENCOUNTER — Telehealth: Payer: Self-pay | Admitting: Family Medicine

## 2012-11-13 DIAGNOSIS — R609 Edema, unspecified: Secondary | ICD-10-CM | POA: Insufficient documentation

## 2012-11-13 LAB — COMPREHENSIVE METABOLIC PANEL
ALT: 23 U/L (ref 0–53)
AST: 21 U/L (ref 0–37)
Albumin: 2.9 g/dL — ABNORMAL LOW (ref 3.5–5.2)
Alkaline Phosphatase: 80 U/L (ref 39–117)
BUN: 18 mg/dL (ref 6–23)
CO2: 25 mEq/L (ref 19–32)
Calcium: 8.8 mg/dL (ref 8.4–10.5)
Chloride: 106 mEq/L (ref 96–112)
Creatinine, Ser: 1.8 mg/dL — ABNORMAL HIGH (ref 0.4–1.5)
GFR: 40.48 mL/min — ABNORMAL LOW (ref >60.00)
Glucose, Bld: 199 mg/dL — ABNORMAL HIGH (ref 70–99)
Potassium: 3.7 mEq/L (ref 3.5–5.1)
Sodium: 139 mEq/L (ref 135–145)
Total Bilirubin: 0.4 mg/dL (ref 0.3–1.2)
Total Protein: 6.7 g/dL (ref 6.0–8.3)

## 2012-11-13 LAB — CBC WITH DIFFERENTIAL/PLATELET
Basophils Absolute: 0 10*3/uL (ref 0.0–0.1)
Basophils Relative: 0.3 % (ref 0.0–3.0)
Eosinophils Absolute: 0.1 10*3/uL (ref 0.0–0.7)
Eosinophils Relative: 0.9 % (ref 0.0–5.0)
HCT: 46.7 % (ref 39.0–52.0)
Hemoglobin: 15.9 g/dL (ref 13.0–17.0)
Lymphocytes Relative: 9.3 % — ABNORMAL LOW (ref 12.0–46.0)
Lymphs Abs: 1 10*3/uL (ref 0.7–4.0)
MCHC: 34.1 g/dL (ref 30.0–36.0)
MCV: 83.5 fl (ref 78.0–100.0)
Monocytes Absolute: 0.5 10*3/uL (ref 0.1–1.0)
Monocytes Relative: 4.6 % (ref 3.0–12.0)
Neutro Abs: 9.4 10*3/uL — ABNORMAL HIGH (ref 1.4–7.7)
Neutrophils Relative %: 84.9 % — ABNORMAL HIGH (ref 43.0–77.0)
Platelets: 230 10*3/uL (ref 150.0–400.0)
RBC: 5.59 Mil/uL (ref 4.22–5.81)
RDW: 15.7 % — ABNORMAL HIGH (ref 11.5–14.6)
WBC: 11.1 10*3/uL — ABNORMAL HIGH (ref 4.5–10.5)

## 2012-11-13 LAB — BRAIN NATRIURETIC PEPTIDE: Brain Natriuretic Peptide: 150 pg/mL — ABNORMAL HIGH (ref 0.0–100.0)

## 2012-11-13 MED ORDER — HYDRALAZINE HCL 25 MG PO TABS: 25.0000 mg | ORAL_TABLET | Freq: Four times a day (QID) | ORAL | Status: DC

## 2012-11-13 NOTE — Telephone Encounter (Signed)
Call pt.  Increase the hydralazine to 25mg  QID for 1 week and then let me know about his pressures on Friday.  I'll await his labs in the meantime.  Thanks.  Med list adjusted.

## 2012-11-13 NOTE — Telephone Encounter (Signed)
Patient notified as instructed by telephone. 

## 2012-11-13 NOTE — Assessment & Plan Note (Signed)
He had already taken and extra dose of lasix for this episode.  This has been chronic since his prev cellulitis.  It doesn't appear infected and I doubt dvt given the timeline overall.  Prev u/s neg for DVT.  Likely due to a combination of factors, vein incompetence with CHF.  Would elevate for now and check his labs before making any adjustments.  He asked about vasc eval and this is reasonable.    Addendum- see following phone note.  Still awaiting labs.

## 2012-11-13 NOTE — Telephone Encounter (Signed)
Called patient to give him the Vascular Surgery appt info. Patient asked me to relay his BP for today at 8:30am. His BP was 206/109.

## 2012-11-15 ENCOUNTER — Telehealth: Payer: Self-pay | Admitting: Family Medicine

## 2012-11-15 NOTE — Telephone Encounter (Signed)
Patient advised.

## 2012-11-15 NOTE — Telephone Encounter (Signed)
Please call pt. The BNP test is a heart failure test.  He has known heart failure and I was checking this to make sure it wasn't very high.  It wasn't very high, but mildly elevated.  This is okay given his history and situation- it was about where I expected it to be.  If his BP is that much improved, then I would continue as is with the med changes for now.  I'll await input from the vascular clinic about the leg swelling.  Thanks.

## 2012-11-15 NOTE — Telephone Encounter (Signed)
Caller: Stephen [name]/Patient; Phone: [phone]; Reason for Call: Pt was to call back and advise MD what is BP was today.  It is 119/63.  Pt was advised by the nurse 2 days ago that his brain peptide was elevated and doesn't understand what that means.  He is asking for nurse to call back and explain.  Office please call pt back concerning his abnormal lab value.

## 2013-01-09 ENCOUNTER — Other Ambulatory Visit: Payer: Self-pay

## 2013-01-16 ENCOUNTER — Encounter: Payer: Self-pay | Admitting: Cardiovascular Disease

## 2013-01-16 ENCOUNTER — Ambulatory Visit (INDEPENDENT_AMBULATORY_CARE_PROVIDER_SITE_OTHER): Payer: Medicare PPO | Admitting: Cardiovascular Disease

## 2013-01-16 VITALS — BP 120/66 | HR 64 | Ht 65.0 in | Wt 256.5 lb

## 2013-01-16 DIAGNOSIS — I2510 Atherosclerotic heart disease of native coronary artery without angina pectoris: Secondary | ICD-10-CM

## 2013-01-16 DIAGNOSIS — E1149 Type 2 diabetes mellitus with other diabetic neurological complication: Secondary | ICD-10-CM

## 2013-01-16 DIAGNOSIS — J8410 Pulmonary fibrosis, unspecified: Secondary | ICD-10-CM

## 2013-01-16 DIAGNOSIS — I10 Essential (primary) hypertension: Secondary | ICD-10-CM

## 2013-01-16 DIAGNOSIS — I5032 Chronic diastolic (congestive) heart failure: Secondary | ICD-10-CM

## 2013-01-16 DIAGNOSIS — I509 Heart failure, unspecified: Secondary | ICD-10-CM

## 2013-01-16 DIAGNOSIS — R609 Edema, unspecified: Secondary | ICD-10-CM

## 2013-01-16 NOTE — Assessment & Plan Note (Signed)
Blood pressure is well controlled on today's visit. No changes made to the medications. 

## 2013-01-16 NOTE — Assessment & Plan Note (Signed)
We have encouraged continued exercise, careful diet management in an effort to lose weight. 

## 2013-01-16 NOTE — Assessment & Plan Note (Signed)
Currently with no symptoms of angina. No further workup at this time. Continue current medication regimen. 

## 2013-01-16 NOTE — Assessment & Plan Note (Signed)
Appears relatively euvolemic, possibly even prerenal given a climb in his creatinine to 1.8. I suggested he skipped a diuretic dose here and there, increase his fluid intake slightly. Edema is from venous insufficiency not from CHF.

## 2013-01-16 NOTE — Assessment & Plan Note (Signed)
He reports breathing is stable on low nasal cannula oxygen

## 2013-01-16 NOTE — Progress Notes (Signed)
Patient ID: Stephen [name], male    DOB: 03-13-1946, 67 y.o.   MRN: [id number]  HPI Comments: Stephen [name] is a very pleasant 67 year old gentleman with a history of coronary artery disease, PCI to the mid RCA and mid left circumflex in October 2007, interstitial fibrosis, with wedge resection, hypertension, hyperlipidemia, remote tobacco abuse, obesity, chronic substernal chest pain who presents for routine followup. History of poorly controlled diabetes  Open lung biopsy was performed November 2009 for bilateral pulmonary infiltrates. Stress test July 2011 was dobutamine stress that showed no significant ischemia, inadequate heart rate was achieved.   right and left heart catheterization January 2011 showed 50% mid LAD disease, 60% at the ostium of the diagonal #2, 30% proximal left circumflex disease, patent stent in the left circumflex, patent stent of the mid RCA, wedge pressure of 16, PA pressure mean 22, right ventricular pressure 41/8 .   echocardiogram September 2010 shows normal systolic function, diastolic dysfunction, normal RV function unable to evaluate right ventricular systolic pressures  He reports that overall he is doing well.  He reports having problems with his blood pressure in March and April but this has settled back down and now is on his regular medications. Sugars have been doing better, most recent hemoglobin A1c 8.1, down from 10.5 Creatinine has climbed to 1.8. He continues to take diuretics daily Continues to have chronic swelling of his right greater than left leg from venous issues. He scheduled to see Dr. Lucky Cowboy. Overall has no new complaints. No chest pain. Shortness of breath is stable, wears nasal cannula oxygen.  EKG shows normal sinus rhythm with rate 564 beats per minute with poor R-wave progression to the anterior precordial leads, consider old anterior MI, left axis deviation    Outpatient Encounter Prescriptions as of 01/16/2013  Medication Sig Dispense  Refill  . amitriptyline (ELAVIL) 50 MG tablet Take 1 tablet (50 mg total) by mouth at bedtime.  90 tablet  1  . aspirin 81 MG tablet Take 81 mg by mouth 2 (two) times daily.        Marland Kitchen atorvastatin (LIPITOR) 80 MG tablet Take 1 tablet (80 mg total) by mouth at bedtime.  90 tablet  3  . carvedilol (COREG) 25 MG tablet Take 1 tablet (25 mg total) by mouth 2 (two) times daily with a meal.  180 tablet  1  . cloNIDine (CATAPRES) 0.3 MG tablet Take 1 tablet (0.3 mg total) by mouth 3 (three) times daily.  90 tablet  11  . clopidogrel (PLAVIX) 75 MG tablet TAKE ONE TABLET BY MOUTH EVERY DAY  90 tablet  3  . finasteride (PROSCAR) 5 MG tablet Once daily      . furosemide (LASIX) 80 MG tablet Take 1 tablet (80 mg total) by mouth every other day.  45 tablet  3  . gabapentin (NEURONTIN) 800 MG tablet 1 tablet in the morning and 1 and 1/2 at bedtime  225 tablet  3  . hydrALAZINE (APRESOLINE) 25 MG tablet Take 1 tablet (25 mg total) by mouth 4 (four) times daily.      . insulin NPH-insulin regular (NOVOLIN 70/30) (70-30) 100 UNIT/ML injection Inject into the skin. 70 units every AM and 30 units in the evening      . isosorbide mononitrate (IMDUR) 60 MG 24 hr tablet Take 1 tablet (60 mg total) by mouth 2 (two) times daily.  180 tablet  3  . nitroGLYCERIN (NITROSTAT) 0.4 MG SL tablet Place 0.4 mg under  the tongue every 5 (five) minutes as needed. May repeat x3        . omeprazole (PRILOSEC) 20 MG capsule Take 20 mg by mouth daily.        . Tamsulosin HCl (FLOMAX) 0.4 MG CAPS Take 0.4 mg by mouth daily. Once daily      . traMADol (ULTRAM) 50 MG tablet Take 1 tablet (50 mg total) by mouth 2 (two) times daily as needed for pain.  180 tablet  1  . triamterene-hydrochlorothiazide (MAXZIDE-25) 37.5-25 MG per tablet Take 1 tablet by mouth daily.       No facility-administered encounter medications on file as of 01/16/2013.     Review of Systems  Constitutional: Negative.   HENT: Negative.   Eyes: Negative.    Respiratory: Positive for shortness of breath.   Cardiovascular: Positive for leg swelling.  Gastrointestinal: Negative.   Musculoskeletal: Negative.   Skin: Negative.   Neurological: Negative.   Psychiatric/Behavioral: Negative.   All other systems reviewed and are negative.   BP 120/66  Pulse 64  Ht 5\' 5"  (1.651 m)  Wt 256 lb 8 oz (116.348 kg)  BMI 42.68 kg/m2  Physical Exam  Nursing note and vitals reviewed. Constitutional: He is oriented to person, place, and time. He appears well-developed and well-nourished.  Morbidly obese.  HENT:  Head: Normocephalic.  Nose: Nose normal.  Mouth/Throat: Oropharynx is clear and moist.  Eyes: Conjunctivae are normal. Pupils are equal, round, and reactive to light.  Neck: Normal range of motion. Neck supple. No JVD present.  Cardiovascular: Normal rate, regular rhythm, S1 normal, S2 normal, normal heart sounds and intact distal pulses.  Exam reveals no gallop and no friction rub.   No murmur heard. Trace to 1+ pitting edema of the right lower extremity with mild erythema diffusely and warmth  Pulmonary/Chest: Effort normal and breath sounds normal. No respiratory distress. He has no wheezes. He has no rales. He exhibits no tenderness.  Abdominal: Soft. Bowel sounds are normal. He exhibits no distension. There is no tenderness.  Musculoskeletal: Normal range of motion. He exhibits edema. He exhibits no tenderness.  Lymphadenopathy:    He has no cervical adenopathy.  Neurological: He is alert and oriented to person, place, and time. Coordination normal.  Skin: Skin is warm and dry. No rash noted. No erythema.  Psychiatric: He has a normal mood and affect. His behavior is normal. Judgment and thought content normal.      Assessment and Plan

## 2013-01-16 NOTE — Assessment & Plan Note (Signed)
He is scheduled to see Dr. Lucky Cowboy, presumably for vein mapping.

## 2013-01-16 NOTE — Patient Instructions (Addendum)
You are doing well. No medication changes were made. Skip a lasix here and there as renal function looking a little dry  Please call us if you have new issues that need to be addressed before your next appt.  Your physician wants you to follow-up in: 6 months.  You will receive a reminder letter in the mail two months in advance. If you don't receive a letter, please call our office to schedule the follow-up appointment.

## 2013-02-04 ENCOUNTER — Ambulatory Visit: Payer: Medicare PPO | Admitting: Pulmonary Disease

## 2013-03-11 ENCOUNTER — Ambulatory Visit: Payer: Medicare PPO | Admitting: Pulmonary Disease

## 2013-04-22 ENCOUNTER — Encounter: Payer: Self-pay | Admitting: Pulmonary Disease

## 2013-04-22 ENCOUNTER — Ambulatory Visit (INDEPENDENT_AMBULATORY_CARE_PROVIDER_SITE_OTHER): Payer: Medicare PPO | Admitting: Pulmonary Disease

## 2013-04-22 VITALS — BP 150/86 | HR 57 | Temp 98.0°F | Ht 65.0 in | Wt 251.0 lb

## 2013-04-22 DIAGNOSIS — Z23 Encounter for immunization: Secondary | ICD-10-CM

## 2013-04-22 DIAGNOSIS — J8410 Pulmonary fibrosis, unspecified: Secondary | ICD-10-CM

## 2013-04-22 DIAGNOSIS — G4733 Obstructive sleep apnea (adult) (pediatric): Secondary | ICD-10-CM

## 2013-04-22 NOTE — Assessment & Plan Note (Signed)
Continue CPAP at 16cm H20

## 2013-04-22 NOTE — Assessment & Plan Note (Addendum)
This has been a stable interval for Stephen [name]. I suspect that the episode he described with dyspnea and chest tightness two weeks ago was because he developed some hypoxemia with increased exertion.  He did not feel that it was consistent with prior episodes of angina.   His exam is unchanged to day.  So I don't see any evidence of progression of his disease, but getting a CXR will help.  Plan: -at this point keep oxygen Rx as written -get CXR -I encouraged him to purchase a home O2 saturation monitor and to use this to keep an eye on his saturation while on Room air -continue O2 at night with CPAP

## 2013-04-22 NOTE — Patient Instructions (Signed)
Purchase a home oxygen saturation monitor and keep an eye on your oxygen saturation. Keep losing weight and using your CPAP machine as you are doing We will see you back in 6 months or sooner if needed

## 2013-04-22 NOTE — Progress Notes (Signed)
Subjective:    Patient ID: Stephen [name], male    DOB: May 01, 1946, 67 y.o.   MRN: [id number]  Synopsis: Stephen [name] as previously followed by Dr. 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 In 2009 he was evaluated for this at [facility] and had an open lung biopsy which was consistent with either metal dust exposure versus hypersensitivity pneumonitis. He was placed on 2 L of oxygen at that time.  HPI  07/23/2012 routine office visit>> Stephen [name] returns to clinic today stating that he is doing well. He did not followup with pulmonary rehabilitation because he says there is a $40 co-pay every time he has to go. He claims he has been exercising on a regular basis. He says that his shortness of breath is unchanged compared to 6 months ago. He continues to use his oxygen with exertion. He uses his CPAP machine every night. He has recently experienced some cellulitis but he says this is getting better. He has no cough. Otherwise he says that things are going well.  04/22/2013 ROV > Stephen [name] says that he had some chest tightness a week ago when he was cutting the grass.  He didn't have any chest pain but did have wheezing.  It has not recurred since then.  He is still using his CPAP machine at night, every night.  He says that he uses oxygen with the CPAP machine.  He has had a few episodes of cellulitis requiring a hospitalization.    Past Medical History  Diagnosis Date  . 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 DM2 (diabetes mellitus, type 2)     insulin requiring  . Diabetes mellitus with neuropathy   . OSA (obstructive sleep apnea)   . HTN (hypertension)   . CHF (congestive heart failure)   . Kidney stones   . Dyslipidemia   . Metabolic syndrome   . Arthritis   . GERD (gastroesophageal reflux disease)   . Heart disease   . High cholesterol   . Kidney disease   . Colon polyps   . Thyroid disorder   .  Recurrent UTI   . 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 Adrenal gland anomaly   . Skin cancer     head     Review of Systems  Constitutional: Negative for fever, chills and appetite change.  HENT: Negative for postnasal drip, rhinorrhea, sinus pressure and sneezing.   Respiratory: Positive for shortness of breath. Negative for cough, chest tightness and wheezing.   Cardiovascular: Positive for leg swelling. Negative for chest pain and palpitations.       Objective:   Physical Exam  Filed Vitals:   04/22/13 0905  BP: 150/86  Pulse: 57  Temp: 98 F (36.7 C)  TempSrc: Oral  Height: 5\' 5"  (1.651 m)  Weight: 251 lb (113.853 kg)  SpO2: 97%  2L [place] continuous  Gen: obese white male, no acute distress HEENT: NCAT, EOMi, thick neck supple without masses PULM: CTA B CV: RRR, distant heart sounds, cannot assess JVD due to body habitus AB: BS+, soft, nontender, no hsm Ext: warm, chronic edema, compression stocking, no clubbing, no cyanosis   2009 [facility] records including pathology report reviewed  January 2014 simple spirometry ratio normal, FVC 2.95 L (76% predicted January 2014 6 minute walk performed  on room air: O2 saturation dropped no lower than 95%, 274 m walk     Assessment & Plan:   PULMONARY FIBROSIS This has been a stable interval for Stephen [name]. I suspect that the episode he described with dyspnea and chest tightness two weeks ago was because he developed some hypoxemia with increased exertion.  He did not feel that it was consistent with prior episodes of angina.   His exam is unchanged to day.  So I don't see any evidence of progression of his disease, but getting a CXR will help.  Plan: -at this point keep oxygen Rx as written -get CXR -I encouraged him to purchase a home O2 saturation monitor and to use this to keep an eye on his saturation while on Room air -continue O2 at night with  CPAP  SLEEP APNEA, OBSTRUCTIVE Continue CPAP at 16cm H20    Updated Medication List Outpatient Encounter Prescriptions as of 04/22/2013  Medication Sig Dispense Refill  . amitriptyline (ELAVIL) 50 MG tablet Take 1 tablet (50 mg total) by mouth at bedtime.  90 tablet  1  . aspirin 81 MG tablet Take 81 mg by mouth 2 (two) times daily.        Marland Kitchen atorvastatin (LIPITOR) 80 MG tablet Take 1 tablet (80 mg total) by mouth at bedtime.  90 tablet  3  . carvedilol (COREG) 25 MG tablet Take 1 tablet (25 mg total) by mouth 2 (two) times daily with a meal.  180 tablet  1  . cloNIDine (CATAPRES) 0.3 MG tablet Take 1 tablet (0.3 mg total) by mouth 3 (three) times daily.  90 tablet  11  . clopidogrel (PLAVIX) 75 MG tablet TAKE ONE TABLET BY MOUTH EVERY DAY  90 tablet  3  . finasteride (PROSCAR) 5 MG tablet Once daily      . furosemide (LASIX) 80 MG tablet Take 1 tablet (80 mg total) by mouth every other day.  45 tablet  3  . gabapentin (NEURONTIN) 800 MG tablet 1 tablet in the morning and 1 and 1/2 at bedtime  225 tablet  3  . hydrALAZINE (APRESOLINE) 25 MG tablet Take 1 tablet (25 mg total) by mouth 4 (four) times daily.      . insulin NPH-insulin regular (NOVOLIN 70/30) (70-30) 100 UNIT/ML injection Inject into the skin. 70 units every AM and 30 units in the evening      . isosorbide mononitrate (IMDUR) 60 MG 24 hr tablet Take 1 tablet (60 mg total) by mouth 2 (two) times daily.  180 tablet  3  . nitroGLYCERIN (NITROSTAT) 0.4 MG SL tablet Place 0.4 mg under the tongue every 5 (five) minutes as needed. May repeat x3        . omeprazole (PRILOSEC) 20 MG capsule Take 20 mg by mouth daily.        . Tamsulosin HCl (FLOMAX) 0.4 MG CAPS Take 0.4 mg by mouth daily. Once daily      . traMADol (ULTRAM) 50 MG tablet Take 1 tablet (50 mg total) by mouth 2 (two) times daily as needed for pain.  180 tablet  1  . triamterene-hydrochlorothiazide (MAXZIDE-25) 37.5-25 MG per tablet Take 1 tablet by mouth daily.       No  facility-administered encounter medications on file as of 04/22/2013.

## 2013-04-24 ENCOUNTER — Encounter: Payer: Self-pay | Admitting: Family Medicine

## 2013-04-24 ENCOUNTER — Ambulatory Visit (INDEPENDENT_AMBULATORY_CARE_PROVIDER_SITE_OTHER)
Admission: RE | Admit: 2013-04-24 | Discharge: 2013-04-24 | Disposition: A | Discharge status: Home / Self Care | Payer: Medicare PPO | Source: Ambulatory Visit | Attending: Pulmonary Disease | Admitting: Pulmonary Disease

## 2013-04-24 ENCOUNTER — Ambulatory Visit (INDEPENDENT_AMBULATORY_CARE_PROVIDER_SITE_OTHER): Payer: Medicare PPO | Admitting: Family Medicine

## 2013-04-24 VITALS — BP 160/70 | HR 78 | Temp 98.0°F | Wt 251.0 lb

## 2013-04-24 DIAGNOSIS — E1149 Type 2 diabetes mellitus with other diabetic neurological complication: Secondary | ICD-10-CM

## 2013-04-24 DIAGNOSIS — J8410 Pulmonary fibrosis, unspecified: Secondary | ICD-10-CM

## 2013-04-24 DIAGNOSIS — E119 Type 2 diabetes mellitus without complications: Secondary | ICD-10-CM

## 2013-04-24 DIAGNOSIS — M62838 Other muscle spasm: Secondary | ICD-10-CM

## 2013-04-24 LAB — HEMOGLOBIN A1C: Hgb A1c MFr Bld: 7.5 % — ABNORMAL HIGH (ref 4.6–6.5)

## 2013-04-24 MED ORDER — INSULIN NPH ISOPHANE & REGULAR (70-30) 100 UNIT/ML ~~LOC~~ SUSP: SUBCUTANEOUS | Status: DC

## 2013-04-24 MED ORDER — METHOCARBAMOL 500 MG PO TABS: 500.0000 mg | ORAL_TABLET | Freq: Every evening | ORAL | Status: DC | PRN

## 2013-04-24 NOTE — Patient Instructions (Addendum)
Go to the lab on the way out for the xray and labs.   Take robaxin at night for the muscle spasms.  If you still have trouble sleeping, then let me know.  Take care.

## 2013-04-24 NOTE — Progress Notes (Signed)
He is intentionally losing weight and had leg interventions per the vascular clinic.  "My legs don't feel at heavy and swollen."  Breathing is at baseline, due for CXR per pulmonary.  This is to be done on the way out today.    Trouble sleeping at night.  Usually up to ~11am.  Feels sleepy, going to sleep for 2-3 hours, then waking back up from pain. He had his shoulder evaluated; back pain and shoulder pain continue but he was told by ortho that intervention wouldn't be reasonable.  Tramadol isn't helping.  R shoulder and L lower back.  Not having daytime back pain.  The back pain wakes him at night.   DM2 per endo but hadn't been seen recently.  He was having troubles with copays.  I offered to check his A1c today. He agreed.    Meds, vitals, and allergies reviewed.   ROS: See HPI.  Otherwise, noncontributory.  nad ncat Mmm On O2 Neck supple rrr ctab but with global dec in BS abd soft, obese Ext with compression stockings on L lower back ttp, muscle spasm noted.  No midline pain, no rash, no CVA pain

## 2013-04-24 NOTE — Assessment & Plan Note (Signed)
Would use heat, massage, robaxin. Notify me if not improved.

## 2013-04-24 NOTE — Assessment & Plan Note (Signed)
Sugar improved with current regimen, see notes on A1c.  I would not change his meds at this point.  He'll f/u with endo when feasible.  App endo help.

## 2013-04-30 ENCOUNTER — Telehealth: Payer: Self-pay | Admitting: Pulmonary Disease

## 2013-04-30 NOTE — Telephone Encounter (Signed)
I actually tried to pull these results yesterday from the [facility] radiology system but could never find a recent CXR for him.  Only found some ultrasound results. Can you request the official report and let me know what it reads? Please extend My apology for the delay Thanks

## 2013-04-30 NOTE — Telephone Encounter (Signed)
Stephen [name], I didn't realize the results were through the [place] office His CXR was fine, no progression of fibrosis. Thanks, Ruby Cola

## 2013-04-30 NOTE — Telephone Encounter (Signed)
Pt aware that once BQ results on cxr we will contact him. Expressed understanding.  Please advise Dr Lake Bells. Thanks.

## 2013-05-01 NOTE — Telephone Encounter (Signed)
Results have been explained to patient, pt expressed understanding. Nothing further needed.  

## 2013-05-01 NOTE — Telephone Encounter (Signed)
Pt is returning triage's call.  Stephen [name] ° °

## 2013-05-01 NOTE — Telephone Encounter (Signed)
ATC x 1 NA voicemail not set up

## 2013-07-03 HISTORY — PX: RENAL ARTERY STENT: SHX2321

## 2013-07-09 ENCOUNTER — Telehealth: Payer: Self-pay

## 2013-07-09 NOTE — Telephone Encounter (Signed)
Pt has changed insurance and Mrs Alberda request meds sent to new pharmacy. Mrs Cords will cb with names of meds Dr [name] fills for pt for written rx. Mrs Booras will cb.

## 2013-07-11 ENCOUNTER — Other Ambulatory Visit: Payer: Self-pay

## 2013-07-11 MED ORDER — TRIAMTERENE-HCTZ 37.5-25 MG PO TABS: 1.0000 | ORAL_TABLET | Freq: Every day | ORAL | Status: DC

## 2013-07-11 MED ORDER — CLOPIDOGREL BISULFATE 75 MG PO TABS: ORAL_TABLET | ORAL | Status: DC

## 2013-07-11 MED ORDER — ISOSORBIDE MONONITRATE ER 60 MG PO TB24: 60.0000 mg | ORAL_TABLET | Freq: Two times a day (BID) | ORAL | Status: DC

## 2013-07-11 MED ORDER — CLONIDINE HCL 0.3 MG PO TABS: 0.3000 mg | ORAL_TABLET | Freq: Three times a day (TID) | ORAL | Status: DC

## 2013-07-11 NOTE — Telephone Encounter (Signed)
Requested Prescriptions   Signed Prescriptions Disp Refills  . cloNIDine (CATAPRES) 0.3 MG tablet 270 tablet 3    Sig: Take 1 tablet (0.3 mg total) by mouth 3 (three) times daily.    Authorizing Provider: Minna Merritts    Ordering User: Eugenio Hoes, MARINA C  . clopidogrel (PLAVIX) 75 MG tablet 90 tablet 3    Sig: TAKE ONE TABLET BY MOUTH EVERY DAY    Authorizing Provider: Minna Merritts    Ordering User: LOPEZ, MARINA C  . isosorbide mononitrate (IMDUR) 60 MG 24 hr tablet 180 tablet 3    Sig: Take 1 tablet (60 mg total) by mouth 2 (two) times daily.    Authorizing Provider: Minna Merritts    Ordering User: Britt Bottom triamterene-hydrochlorothiazide (MAXZIDE-25) 37.5-25 MG per tablet 90 tablet 3    Sig: Take 1 tablet by mouth daily.    Authorizing Provider: Minna Merritts    Ordering User: Britt Bottom

## 2013-07-14 ENCOUNTER — Other Ambulatory Visit: Payer: Self-pay

## 2013-07-14 ENCOUNTER — Other Ambulatory Visit: Payer: Self-pay | Admitting: Family Medicine

## 2013-07-14 MED ORDER — ATORVASTATIN CALCIUM 80 MG PO TABS: 80.0000 mg | ORAL_TABLET | Freq: Every day | ORAL | Status: DC

## 2013-07-14 MED ORDER — "INSULIN SYRINGE 30G X 5/16"" 1 ML MISC": Status: DC

## 2013-07-14 MED ORDER — AMITRIPTYLINE HCL 50 MG PO TABS: 50.0000 mg | ORAL_TABLET | Freq: Every day | ORAL | Status: DC

## 2013-07-14 MED ORDER — FUROSEMIDE 80 MG PO TABS: 80.0000 mg | ORAL_TABLET | ORAL | Status: DC

## 2013-07-14 MED ORDER — GLUCOSE BLOOD VI STRP: ORAL_STRIP | Status: DC

## 2013-07-14 MED ORDER — TRAMADOL HCL 50 MG PO TABS: 50.0000 mg | ORAL_TABLET | Freq: Two times a day (BID) | ORAL | Status: DC | PRN

## 2013-07-14 MED ORDER — INSULIN NPH ISOPHANE & REGULAR (70-30) 100 UNIT/ML ~~LOC~~ SUSP: SUBCUTANEOUS | Status: DC

## 2013-07-14 MED ORDER — GABAPENTIN 800 MG PO TABS: ORAL_TABLET | ORAL | Status: DC

## 2013-07-14 MED ORDER — CARVEDILOL 25 MG PO TABS: 25.0000 mg | ORAL_TABLET | Freq: Two times a day (BID) | ORAL | Status: DC

## 2013-07-14 NOTE — Telephone Encounter (Signed)
plz phone in. 

## 2013-07-14 NOTE — Telephone Encounter (Signed)
Pt request refill Tramadol to [facility] [place]. Please advise.

## 2013-07-14 NOTE — Telephone Encounter (Signed)
Pt is changing to [facility] mail order, acct already set up, pt has not been taking all meds regularly due to cost. Pt said now he can afford mail order medications and will take on regular basis. Pt had been seing Dr Gabriel Carina Endo but she is not on current insurance plan and request to be refilled by Dr [name] until get new endo. Pts med list has Novolin 70/30 taking 50 units in AM and 35 u in evening. Pt said was increased to 70 units in AM and remains 35 units in evening. Med list updated.

## 2013-07-14 NOTE — Telephone Encounter (Signed)
Medication phoned to pharmacy.  

## 2013-07-14 NOTE — Telephone Encounter (Signed)
Sent in.  plz notify pt.

## 2013-07-15 ENCOUNTER — Ambulatory Visit: Payer: Medicare PPO | Admitting: Cardiovascular Disease

## 2013-07-18 ENCOUNTER — Ambulatory Visit: Payer: Medicare PPO | Admitting: Cardiovascular Disease

## 2013-07-21 ENCOUNTER — Telehealth: Payer: Self-pay | Admitting: *Deleted

## 2013-07-21 ENCOUNTER — Ambulatory Visit: Payer: Self-pay | Admitting: Vascular Surgery

## 2013-07-21 LAB — BASIC METABOLIC PANEL
Anion Gap: 7 (ref 7–16)
BUN: 27 mg/dL — ABNORMAL HIGH (ref 7–18)
Calcium, Total: 9.2 mg/dL (ref 8.5–10.1)
Chloride: 106 mmol/L (ref 98–107)
Co2: 26 mmol/L (ref 21–32)
Creatinine: 1.79 mg/dL — ABNORMAL HIGH (ref 0.60–1.30)
EGFR (African American): 45 — ABNORMAL LOW
EGFR (Non-African Amer.): 39 — ABNORMAL LOW
Glucose: 97 mg/dL (ref 65–99)
Osmolality: 283 (ref 275–301)
Potassium: 3.8 mmol/L (ref 3.5–5.1)
Sodium: 139 mmol/L (ref 136–145)

## 2013-07-21 NOTE — Telephone Encounter (Signed)
Form done.  Please send back.  It's in my outbox.

## 2013-07-21 NOTE — Telephone Encounter (Signed)
Received prior auth request from pharmacy for Amitriptyline. Auth paperwork obtained, and placed in your inbox.

## 2013-07-22 ENCOUNTER — Encounter: Payer: Self-pay | Admitting: Family Medicine

## 2013-07-23 NOTE — Telephone Encounter (Signed)
Paperwork submitted. Pending notification from insurance company.

## 2013-07-24 ENCOUNTER — Ambulatory Visit: Payer: Medicare PPO | Admitting: Cardiovascular Disease

## 2013-07-28 NOTE — Telephone Encounter (Signed)
Approval form received by insurance company. Will send to provider for signature, then to be scanned into patients medical record.

## 2013-07-31 ENCOUNTER — Encounter: Payer: Self-pay | Admitting: *Deleted

## 2013-07-31 ENCOUNTER — Other Ambulatory Visit: Payer: Self-pay | Admitting: *Deleted

## 2013-08-01 ENCOUNTER — Other Ambulatory Visit: Payer: Self-pay | Admitting: *Deleted

## 2013-08-01 MED ORDER — CLONIDINE HCL 0.3 MG PO TABS: 0.3000 mg | ORAL_TABLET | Freq: Three times a day (TID) | ORAL | Status: DC

## 2013-08-01 NOTE — Telephone Encounter (Signed)
Requested Prescriptions   Signed Prescriptions Disp Refills  . cloNIDine (CATAPRES) 0.3 MG tablet 270 tablet 3    Sig: Take 1 tablet (0.3 mg total) by mouth 3 (three) times daily.    Authorizing Provider: Minna Merritts    Ordering User: Britt Bottom

## 2013-08-07 ENCOUNTER — Ambulatory Visit (INDEPENDENT_AMBULATORY_CARE_PROVIDER_SITE_OTHER): Payer: Commercial Managed Care - HMO | Admitting: Cardiovascular Disease

## 2013-08-07 ENCOUNTER — Encounter: Payer: Self-pay | Admitting: Cardiovascular Disease

## 2013-08-07 VITALS — BP 122/60 | HR 53 | Ht 65.0 in | Wt 264.5 lb

## 2013-08-07 DIAGNOSIS — I509 Heart failure, unspecified: Secondary | ICD-10-CM

## 2013-08-07 DIAGNOSIS — I2510 Atherosclerotic heart disease of native coronary artery without angina pectoris: Secondary | ICD-10-CM

## 2013-08-07 DIAGNOSIS — E1149 Type 2 diabetes mellitus with other diabetic neurological complication: Secondary | ICD-10-CM

## 2013-08-07 DIAGNOSIS — J8410 Pulmonary fibrosis, unspecified: Secondary | ICD-10-CM

## 2013-08-07 DIAGNOSIS — R079 Chest pain, unspecified: Secondary | ICD-10-CM

## 2013-08-07 DIAGNOSIS — I701 Atherosclerosis of renal artery: Secondary | ICD-10-CM

## 2013-08-07 DIAGNOSIS — I10 Essential (primary) hypertension: Secondary | ICD-10-CM

## 2013-08-07 DIAGNOSIS — I5032 Chronic diastolic (congestive) heart failure: Secondary | ICD-10-CM

## 2013-08-07 DIAGNOSIS — R0602 Shortness of breath: Secondary | ICD-10-CM

## 2013-08-07 NOTE — Assessment & Plan Note (Signed)
We have encouraged continued exercise, careful diet management in an effort to lose weight. 

## 2013-08-07 NOTE — Progress Notes (Signed)
Patient ID: Stephen [name], male    DOB: September 15, 1945, 68 y.o.   MRN: [id number]  HPI Comments: [name] is a very pleasant 68 year old gentleman with a history of coronary artery disease, PCI to the mid RCA and mid left circumflex in October 2007, interstitial fibrosis, with wedge resection, hypertension, hyperlipidemia, remote tobacco abuse, obesity, chronic substernal chest pain who presents for routine followup. History of  diabetes Open lung biopsy was performed November 2009 for bilateral pulmonary infiltrates.  Stress test July 2011 was dobutamine stress that showed no significant ischemia, inadequate heart rate was achieved. right and left heart catheterization January 2011 showed 50% mid LAD disease, 60% at the ostium of the diagonal #2, 30% proximal left circumflex disease, patent stent in the left circumflex, patent stent of the mid RCA, wedge pressure of 16, PA pressure mean 22, right ventricular pressure 41/8 .   echocardiogram September 2010 shows normal systolic function, diastolic dysfunction, normal RV function unable to evaluate right ventricular systolic pressures  In followup today, He reports that overall he is doing well.  He does report episode of shortness of breath several months ago while dragging tree branches while doing cleanup in his yard after a storm. Typically breathing has been stable.  Recently had stent to his left renal artery for 80+ percent stenosis several weeks ago, performed by Dr. Lucky Cowboy. Also had laser treatment of the lower extremity veins for leg swelling/venous insufficiency  Most recent hemoglobin A1c 7.5, down from 8.1 Overall has no new complaints. No chest pain. In general, Shortness of breath is stable, wears nasal cannula oxygen.  EKG shows normal sinus rhythm with rate 53 and beats per minute with poor R-wave progression to the anterior precordial leads, consider old anterior MI, left axis deviation, nonspecific ST and T wave abnormality in  leads one and aVL    Outpatient Encounter Prescriptions as of 08/07/2013  Medication Sig  . aspirin 81 MG tablet Take 81 mg by mouth 2 (two) times daily.    Marland Kitchen atorvastatin (LIPITOR) 80 MG tablet Take 1 tablet (80 mg total) by mouth at bedtime.  . carvedilol (COREG) 25 MG tablet Take 1 tablet (25 mg total) by mouth 2 (two) times daily with a meal.  . cloNIDine (CATAPRES) 0.3 MG tablet Take 1 tablet (0.3 mg total) by mouth 3 (three) times daily.  . clopidogrel (PLAVIX) 75 MG tablet TAKE ONE TABLET BY MOUTH EVERY DAY  . finasteride (PROSCAR) 5 MG tablet Once daily  . furosemide (LASIX) 80 MG tablet Take 1 tablet (80 mg total) by mouth every other day.  . gabapentin (NEURONTIN) 800 MG tablet 1 tablet in the morning and 1 and 1/2 at bedtime  . glucose blood (ONE TOUCH TEST STRIPS) test strip Ck blood sugar three times daily and as needed. Dx. 250.62  . hydrALAZINE (APRESOLINE) 25 MG tablet Take 1 tablet (25 mg total) by mouth 4 (four) times daily.  . insulin NPH-regular Human (NOVOLIN 70/30) (70-30) 100 UNIT/ML injection 70 units every AM and 35 units in the evening  . Insulin Syringe-Needle U-100 (INSULIN SYRINGE 1CC/30GX5/16") 30G X 5/16" 1 ML MISC Use as directed to take insulin Dx 250.62  . isosorbide mononitrate (IMDUR) 60 MG 24 hr tablet Take 1 tablet (60 mg total) by mouth 2 (two) times daily.  . methocarbamol (ROBAXIN) 500 MG tablet Take 1-2 tablets (500-1,000 mg total) by mouth at bedtime as needed (muscle spasm).  . nitroGLYCERIN (NITROSTAT) 0.4 MG SL tablet Place 0.4 mg under  the tongue every 5 (five) minutes as needed. May repeat x3    . omeprazole (PRILOSEC) 20 MG capsule Take 20 mg by mouth daily.    Marland Kitchen triamterene-hydrochlorothiazide (MAXZIDE-25) 37.5-25 MG per tablet Take 1 tablet by mouth daily.    Review of Systems  Constitutional: Negative.   HENT: Negative.   Eyes: Negative.   Respiratory: Positive for shortness of breath.   Cardiovascular: Positive for leg swelling.   Gastrointestinal: Negative.   Endocrine: Negative.   Musculoskeletal: Negative.   Skin: Negative.   Allergic/Immunologic: Negative.   Neurological: Negative.   Hematological: Negative.   Psychiatric/Behavioral: Negative.   All other systems reviewed and are negative.   BP 122/60  Pulse 53  Ht 5\' 5"  (1.651 m)  Wt 264 lb 8 oz (119.976 kg)  BMI 44.01 kg/m2  Physical Exam  Nursing note and vitals reviewed. Constitutional: He is oriented to person, place, and time. He appears well-developed and well-nourished.  Morbidly obese.  HENT:  Head: Normocephalic.  Nose: Nose normal.  Mouth/Throat: Oropharynx is clear and moist.  Eyes: Conjunctivae are normal. Pupils are equal, round, and reactive to light.  Neck: Normal range of motion. Neck supple. No JVD present.  Cardiovascular: Normal rate, regular rhythm, S1 normal, S2 normal, normal heart sounds and intact distal pulses.  Exam reveals no gallop and no friction rub.   No murmur heard. Trace  edema of the right lower extremity   Pulmonary/Chest: Effort normal and breath sounds normal. No respiratory distress. He has no wheezes. He has no rales. He exhibits no tenderness.  Abdominal: Soft. Bowel sounds are normal. He exhibits no distension. There is no tenderness.  Musculoskeletal: Normal range of motion. He exhibits edema. He exhibits no tenderness.  Lymphadenopathy:    He has no cervical adenopathy.  Neurological: He is alert and oriented to person, place, and time. Coordination normal.  Skin: Skin is warm and dry. No rash noted. No erythema.  Psychiatric: He has a normal mood and affect. His behavior is normal. Judgment and thought content normal.      Assessment and Plan

## 2013-08-07 NOTE — Patient Instructions (Signed)
You are doing well. No medication changes were made.  Please call us if you have new issues that need to be addressed before your next appt.  Your physician wants you to follow-up in: 6 months.  You will receive a reminder letter in the mail two months in advance. If you don't receive a letter, please call our office to schedule the follow-up appointment.   

## 2013-08-07 NOTE — Assessment & Plan Note (Signed)
Reports shortness of breath and oxygen requirements are stable.

## 2013-08-07 NOTE — Assessment & Plan Note (Signed)
He appears relatively euvolemic on today's exam. He'll continue on Lasix every other day

## 2013-08-07 NOTE — Assessment & Plan Note (Signed)
Recent stent placed to left renal artery. Followed by Dr. Lucky Cowboy

## 2013-08-07 NOTE — Assessment & Plan Note (Signed)
Currently with no symptoms of angina. No further workup at this time. Continue current medication regimen. 

## 2013-08-07 NOTE — Assessment & Plan Note (Signed)
Blood pressure is well controlled on today's visit. No changes made to the medications. 

## 2013-08-28 ENCOUNTER — Encounter: Payer: Self-pay | Admitting: Family Medicine

## 2013-09-15 ENCOUNTER — Ambulatory Visit (INDEPENDENT_AMBULATORY_CARE_PROVIDER_SITE_OTHER): Payer: Commercial Managed Care - HMO | Admitting: Family Medicine

## 2013-09-15 ENCOUNTER — Encounter: Payer: Self-pay | Admitting: Family Medicine

## 2013-09-15 VITALS — BP 130/54 | HR 67 | Temp 97.9°F | Wt 257.5 lb

## 2013-09-15 DIAGNOSIS — E1149 Type 2 diabetes mellitus with other diabetic neurological complication: Secondary | ICD-10-CM

## 2013-09-15 DIAGNOSIS — N183 Chronic kidney disease, stage 3 unspecified: Secondary | ICD-10-CM

## 2013-09-15 DIAGNOSIS — E119 Type 2 diabetes mellitus without complications: Secondary | ICD-10-CM

## 2013-09-15 DIAGNOSIS — R609 Edema, unspecified: Secondary | ICD-10-CM

## 2013-09-15 DIAGNOSIS — M549 Dorsalgia, unspecified: Secondary | ICD-10-CM

## 2013-09-15 DIAGNOSIS — Z23 Encounter for immunization: Secondary | ICD-10-CM

## 2013-09-15 LAB — COMPREHENSIVE METABOLIC PANEL WITH GFR
ALT: 22 U/L (ref 0–53)
AST: 15 U/L (ref 0–37)
Albumin: 2.9 g/dL — ABNORMAL LOW (ref 3.5–5.2)
Alkaline Phosphatase: 86 U/L (ref 39–117)
BUN: 25 mg/dL — ABNORMAL HIGH (ref 6–23)
CO2: 30 meq/L (ref 19–32)
Calcium: 8.5 mg/dL (ref 8.4–10.5)
Chloride: 105 meq/L (ref 96–112)
Creatinine, Ser: 1.8 mg/dL — ABNORMAL HIGH (ref 0.4–1.5)
GFR: 39.86 mL/min — ABNORMAL LOW
Glucose, Bld: 318 mg/dL — ABNORMAL HIGH (ref 70–99)
Potassium: 3.8 meq/L (ref 3.5–5.1)
Sodium: 138 meq/L (ref 135–145)
Total Bilirubin: 0.5 mg/dL (ref 0.3–1.2)
Total Protein: 6.4 g/dL (ref 6.0–8.3)

## 2013-09-15 LAB — LIPID PANEL
Cholesterol: 121 mg/dL (ref 0–200)
HDL: 26.8 mg/dL — ABNORMAL LOW
LDL Cholesterol: 63 mg/dL (ref 0–99)
Total CHOL/HDL Ratio: 5
Triglycerides: 155 mg/dL — ABNORMAL HIGH (ref 0.0–149.0)
VLDL: 31 mg/dL (ref 0.0–40.0)

## 2013-09-15 LAB — MICROALBUMIN / CREATININE URINE RATIO
Creatinine,U: 136.3 mg/dL
Microalb Creat Ratio: 162.2 mg/g — ABNORMAL HIGH (ref 0.0–30.0)
Microalb, Ur: 221.2 mg/dL — ABNORMAL HIGH (ref 0.0–1.9)

## 2013-09-15 LAB — HEMOGLOBIN A1C: Hgb A1c MFr Bld: 8.2 % — ABNORMAL HIGH (ref 4.6–6.5)

## 2013-09-15 MED ORDER — GLUCOSE BLOOD VI STRP: ORAL_STRIP | Status: DC

## 2013-09-15 MED ORDER — CYCLOBENZAPRINE HCL 10 MG PO TABS: 5.0000 mg | ORAL_TABLET | Freq: Every evening | ORAL | Status: DC | PRN

## 2013-09-15 NOTE — Progress Notes (Signed)
Pre visit review using our clinic review tool, if applicable. No additional management support is needed unless otherwise documented below in the visit note.  Diabetes:  Insurance changed, couldn't follow up with endo.   Using medications without difficulties:yes Hypoglycemic episodes:no Hyperglycemic episodes:no Feet problems: no Blood Sugars averaging: 122 this AM.  Has been up to 180 in AM when he was ill.   eye exam within last year: due, d/w pt.   Back pain continues at night.  Robaxin didn't help. Throbbing pain, better with movement.  No trauma.  Ongoing pain.    CKD.  Had f/u pending. Appears to be off ACE/ARB currently.  I'll defer to renal about this.  See notes on labs.    PMH and SH reviewed  Meds, vitals, and allergies reviewed.   ROS: See HPI.  Otherwise negative.    GEN: nad, alert and oriented, on O2 HEENT: mucous membranes moist NECK: supple w/o LA CV: rrr. PULM: ctab, no inc wob ABD: soft, +bs EXT: trace edema, much improved from prev exams SKIN: no acute rash L lower back ttp but not ttp in midline  Diabetic foot exam: Normal inspection No skin breakdown No calluses  Normal DP pulses Normal sensation to light touch and monofilament (noted normal exam today) Nails normal

## 2013-09-15 NOTE — Patient Instructions (Signed)
[facility] will get you a pneumonia shot.  Go to the lab on the way out.  We'll contact you with your lab report. We'll send the labs to the kidney clinic.  Use the flexeril for the back pain.  Let me know if that doesn't help.  Take care.  Glad to see you.  Plan on recheck in about 6 months, sooner if needed.

## 2013-09-16 ENCOUNTER — Other Ambulatory Visit: Payer: Self-pay | Admitting: *Deleted

## 2013-09-16 ENCOUNTER — Other Ambulatory Visit: Payer: Self-pay | Admitting: Family Medicine

## 2013-09-16 MED ORDER — GLUCOSE BLOOD VI STRP: ORAL_STRIP | Status: DC

## 2013-09-16 MED ORDER — INSULIN NPH ISOPHANE & REGULAR (70-30) 100 UNIT/ML ~~LOC~~ SUSP: SUBCUTANEOUS | Status: DC

## 2013-09-16 NOTE — Assessment & Plan Note (Signed)
Improved from prev, no erythema.

## 2013-09-16 NOTE — Assessment & Plan Note (Signed)
See notes on labs.  I'll defer ACE/ARB to renal.  BP okay for now.

## 2013-09-16 NOTE — Assessment & Plan Note (Signed)
Unable to get coverage for f/u with endo.  See notes on labs.

## 2013-09-16 NOTE — Assessment & Plan Note (Signed)
Can try flexeril prn.  Sedation caution.

## 2013-09-18 ENCOUNTER — Telehealth: Payer: Self-pay

## 2013-09-18 NOTE — Telephone Encounter (Signed)
Relevant patient education mailed to patient.  

## 2013-10-23 ENCOUNTER — Other Ambulatory Visit: Payer: Self-pay

## 2013-10-23 MED ORDER — CLONIDINE HCL 0.3 MG PO TABS: 0.3000 mg | ORAL_TABLET | Freq: Three times a day (TID) | ORAL | Status: DC

## 2013-11-11 ENCOUNTER — Encounter: Payer: Self-pay | Admitting: Internal Medicine

## 2013-11-11 ENCOUNTER — Ambulatory Visit: Payer: Self-pay | Admitting: Internal Medicine

## 2013-11-11 ENCOUNTER — Ambulatory Visit (INDEPENDENT_AMBULATORY_CARE_PROVIDER_SITE_OTHER): Payer: Commercial Managed Care - HMO | Admitting: Internal Medicine

## 2013-11-11 VITALS — BP 146/82 | HR 68 | Temp 97.7°F

## 2013-11-11 DIAGNOSIS — M5430 Sciatica, unspecified side: Secondary | ICD-10-CM

## 2013-11-11 MED ORDER — TRAMADOL HCL 50 MG PO TABS: 50.0000 mg | ORAL_TABLET | Freq: Three times a day (TID) | ORAL | Status: DC | PRN

## 2013-11-11 NOTE — Patient Instructions (Addendum)

## 2013-11-11 NOTE — Progress Notes (Addendum)
Subjective:    Patient ID: Stephen [name], male    DOB: August 02, 1945, 68 y.o.   MRN: [id number]  HPI  Pt presents to the clinic today with c/o back pain. He reports this started 4 days ago. The pain radiates down his right leg to his knee. He describes the pain as sharp and shooting. He denies any specific injury to the area but was gardening just prior to this. He has tried muscle relaxers, ice and heat without any relief. He denies loss of bowel or bladder.  Review of Systems      Past Medical History  Diagnosis Date  . 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 DM2 (diabetes mellitus, type 2)     insulin requiring  . Diabetes mellitus with neuropathy   . OSA (obstructive sleep apnea)   . HTN (hypertension)   . CHF (congestive heart failure)   . Kidney stones   . Dyslipidemia   . Metabolic syndrome   . Arthritis   . GERD (gastroesophageal reflux disease)   . Heart disease   . High cholesterol   . Kidney disease   . Colon polyps   . Thyroid disorder   . Recurrent UTI   . 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 Adrenal gland anomaly   . Skin cancer     head  . Rotator cuff disorder     right  . Renal artery stenosis     L sided, stent 07/2013    Current Outpatient Prescriptions  Medication Sig Dispense Refill  . aspirin 81 MG tablet Take 81 mg by mouth 2 (two) times daily.        Marland Kitchen atorvastatin (LIPITOR) 80 MG tablet Take 1 tablet (80 mg total) by mouth at bedtime.  90 tablet  1  . carvedilol (COREG) 25 MG tablet Take 1 tablet (25 mg total) by mouth 2 (two) times daily with a meal.  180 tablet  1  . cloNIDine (CATAPRES) 0.3 MG tablet Take 1 tablet (0.3 mg total) by mouth 3 (three) times daily.  270 tablet  3  . clopidogrel (PLAVIX) 75 MG tablet TAKE ONE TABLET BY MOUTH EVERY DAY  90 tablet  3  . finasteride (PROSCAR) 5 MG tablet Once daily      . furosemide (LASIX) 80 MG  tablet Take 1 tablet (80 mg total) by mouth every other day.  45 tablet  1  . gabapentin (NEURONTIN) 800 MG tablet 1 tablet in the morning and 1 and 1/2 at bedtime  225 tablet  1  . glucose blood (ONE TOUCH TEST STRIPS) test strip Ck blood sugar three times daily and as needed. Dx. 250.62  300 each  3  . hydrALAZINE (APRESOLINE) 25 MG tablet Take 25 mg by mouth 3 (three) times daily.      . insulin NPH-regular Human (NOVOLIN 70/30) (70-30) 100 UNIT/ML injection 70-75 units every AM and 35-40 units in the evening      . Insulin Syringe-Needle U-100 (INSULIN SYRINGE 1CC/30GX5/16") 30G X 5/16" 1 ML MISC Use as directed to take insulin Dx 250.62  180 each  1  . isosorbide mononitrate (IMDUR) 60 MG 24 hr tablet Take 1 tablet (60 mg total) by mouth 2 (two) times daily.  180 tablet  3  . nitroGLYCERIN (NITROSTAT) 0.4 MG SL tablet Place 0.4 mg under the  tongue every 5 (five) minutes as needed. May repeat x3        . omeprazole (PRILOSEC) 20 MG capsule Take 20 mg by mouth daily.        Marland Kitchen triamterene-hydrochlorothiazide (MAXZIDE-25) 37.5-25 MG per tablet Take 1 tablet by mouth daily.  90 tablet  3  . cyclobenzaprine (FLEXERIL) 10 MG tablet Take 0.5-1 tablets (5-10 mg total) by mouth at bedtime as needed for muscle spasms.  30 tablet  2   No current facility-administered medications for this visit.    Allergies  Allergen Reactions  . Calcium Channel Blockers     Would avoid if possible due to h/o peripheral edema    Family History  Problem Relation Age of Onset  . Heart attack Father   . Heart attack Mother   . Arthritis Maternal Grandmother   . Breast cancer Maternal Aunt   . Diabetes Maternal Aunt   . Hypertension Other   . Heart attack Brother   . Hypertension Maternal Aunt   . COPD Sister     History   Social History  . Marital Status: Married    Spouse Name: N/A    Number of Children: 2  . Years of Education: N/A   Occupational History  . 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     Social History Main Topics  . Smoking status: Never Smoker   . Smokeless tobacco: Never Used  . Alcohol Use: No  . Drug Use: No  . Sexual Activity: Not on file   Other Topics Concern  . Not on file   Social History Narrative   Enjoys fishing   [employer] fan     Constitutional: Denies fever, malaise, fatigue, headache or abrupt weight changes.  Musculoskeletal: Pt reports low back pain. Denies difficulty with gait, muscle pain or joint pain and swelling.    No other specific complaints in a complete review of systems (except as listed in HPI above).  Objective:   Physical Exam   BP 146/82  Pulse 68  Temp(Src) 97.7 F (36.5 C) (Oral) Wt Readings from Last 3 Encounters:  09/15/13 257 lb 8 oz (116.801 kg)  08/07/13 264 lb 8 oz (119.976 kg)  04/24/13 251 lb (113.853 kg)    General: Appears his stated age, obese but well developed, well nourished in NAD. Cardiovascular: Normal rate and rhythm. S1,S2 noted.  No murmur, rubs or gallops noted. No JVD or BLE edema. No carotid bruits noted. Pulmonary/Chest: Increased effort and diminished breath sounds. No respiratory distress. No wheezes, rales or ronchi noted.  Musculoskeletal: Decreased flexion, extension and lateral rotation of the back. Tender to palpation in the lumbar spine.   BMET    Component Value Date/Time   NA 138 09/15/2013 1009   K 3.8 09/15/2013 1009   CL 105 09/15/2013 1009   CO2 30 09/15/2013 1009   GLUCOSE 318* 09/15/2013 1009   BUN 25* 09/15/2013 1009   CREATININE 1.8* 09/15/2013 1009   CALCIUM 8.5 09/15/2013 1009   GFRNONAA 46* 03/20/2012 1645   GFRAA 53* 03/20/2012 1645    Lipid Panel     Component Value Date/Time   CHOL 121 09/15/2013 1009   TRIG 155.0* 09/15/2013 1009   HDL 26.80* 09/15/2013 1009   CHOLHDL 5 09/15/2013 1009   VLDL 31.0 09/15/2013 1009   LDLCALC 63 09/15/2013 1009    CBC    Component Value  Date/Time  WBC 11.1* 11/12/2012 1716   RBC 5.59 11/12/2012 1716   HGB 15.9 11/12/2012 1716   HCT 46.7 11/12/2012 1716   PLT 230.0 11/12/2012 1716   MCV 83.5 11/12/2012 1716   MCH 28.7 03/20/2012 1645   MCHC 34.1 11/12/2012 1716   RDW 15.7* 11/12/2012 1716   LYMPHSABS 1.0 11/12/2012 1716   MONOABS 0.5 11/12/2012 1716   EOSABS 0.1 11/12/2012 1716   BASOSABS 0.0 11/12/2012 1716    Hgb A1C Lab Results  Component Value Date   HGBA1C 8.2* 09/15/2013        Assessment & Plan:   Sciatica Neuralgia, right side:  Xray lumbar spine RX for tramadol Q12 H prn Will consider pred taper if no improvement in pain- hold off for now d/t hx of dm2 Stretching exercises given  Will follow up after xray results

## 2013-11-11 NOTE — Addendum Note (Signed)
Addended by: Jearld Fenton on: 11/11/2013 11:48 AM   Modules accepted: Orders

## 2013-11-11 NOTE — Progress Notes (Signed)
Pre visit review using our clinic review tool, if applicable. No additional management support is needed unless otherwise documented below in the visit note. 

## 2013-11-12 ENCOUNTER — Encounter: Payer: Self-pay | Admitting: Internal Medicine

## 2013-11-13 ENCOUNTER — Ambulatory Visit (INDEPENDENT_AMBULATORY_CARE_PROVIDER_SITE_OTHER): Payer: Commercial Managed Care - HMO | Admitting: Family Medicine

## 2013-11-13 ENCOUNTER — Encounter: Payer: Self-pay | Admitting: Family Medicine

## 2013-11-13 VITALS — BP 156/74 | HR 61 | Temp 97.8°F

## 2013-11-13 DIAGNOSIS — M549 Dorsalgia, unspecified: Secondary | ICD-10-CM

## 2013-11-13 MED ORDER — PREDNISONE 20 MG PO TABS: ORAL_TABLET | ORAL | Status: DC

## 2013-11-13 MED ORDER — HYDROCODONE-ACETAMINOPHEN 5-325 MG PO TABS: 0.5000 | ORAL_TABLET | Freq: Four times a day (QID) | ORAL | Status: DC | PRN

## 2013-11-13 NOTE — Patient Instructions (Signed)
Try to use the exercises in the meantime.  Use hydrocodone for pain with a stool softener if needed.  If the pain isn't any better, then use the prednisone.  It will run your sugar up.  Call back as needed.  Take care.

## 2013-11-13 NOTE — Progress Notes (Signed)
Pre visit review using our clinic review tool, if applicable. No additional management support is needed unless otherwise documented below in the visit note.  Sugar has been <100 recently.  Still with radicular R leg pain.  No L sided pain.  No B/B dysfunction. No fevers.  No trauma.  Had been out in the garden before the pain started.  Prev not from [facility] reviewed.   nad On O2 at baseline Mmm rrr ctab but globally dec BS, at baseline abd soft, obese Midline back not ttp R SLR positive.  1-2+ edema, improved from prev.  Not tight.

## 2013-11-14 NOTE — Assessment & Plan Note (Signed)
With sciatica. Would like to avoid pred if possible. Try hydrocodone with sedation/GI caution in meantime. If not any better, then use pred with GI/DM cautions. Given instructions re: back exercises in meantime. He agrees. Call back as needed.

## 2013-12-09 ENCOUNTER — Other Ambulatory Visit: Payer: Self-pay | Admitting: Family Medicine

## 2013-12-09 DIAGNOSIS — E1149 Type 2 diabetes mellitus with other diabetic neurological complication: Secondary | ICD-10-CM

## 2013-12-11 ENCOUNTER — Other Ambulatory Visit (INDEPENDENT_AMBULATORY_CARE_PROVIDER_SITE_OTHER): Payer: Commercial Managed Care - HMO

## 2013-12-11 ENCOUNTER — Encounter: Payer: Self-pay | Admitting: *Deleted

## 2013-12-11 DIAGNOSIS — E1149 Type 2 diabetes mellitus with other diabetic neurological complication: Secondary | ICD-10-CM

## 2013-12-11 LAB — HEMOGLOBIN A1C: Hgb A1c MFr Bld: 8 % — ABNORMAL HIGH (ref 4.6–6.5)

## 2013-12-12 ENCOUNTER — Other Ambulatory Visit: Payer: Commercial Managed Care - HMO

## 2013-12-16 ENCOUNTER — Ambulatory Visit (INDEPENDENT_AMBULATORY_CARE_PROVIDER_SITE_OTHER): Payer: Commercial Managed Care - HMO | Admitting: Family Medicine

## 2013-12-16 ENCOUNTER — Encounter: Payer: Self-pay | Admitting: Family Medicine

## 2013-12-16 DIAGNOSIS — E1149 Type 2 diabetes mellitus with other diabetic neurological complication: Secondary | ICD-10-CM

## 2013-12-16 DIAGNOSIS — J8410 Pulmonary fibrosis, unspecified: Secondary | ICD-10-CM

## 2013-12-16 MED ORDER — NITROGLYCERIN 0.4 MG SL SUBL: 0.4000 mg | SUBLINGUAL_TABLET | SUBLINGUAL | Status: DC | PRN

## 2013-12-16 MED ORDER — TRAMADOL HCL 50 MG PO TABS: 50.0000 mg | ORAL_TABLET | Freq: Two times a day (BID) | ORAL | Status: DC | PRN

## 2013-12-16 NOTE — Progress Notes (Signed)
Pre visit review using our clinic review tool, if applicable. No additional management support is needed unless otherwise documented below in the visit note.  Still trying to help his wife care for her parents.  I offered my support.    Diabetes:  Using medications without difficulties:yes Hypoglycemic episodes:very rare, limited sx "I catch it early."   Hyperglycemic episodes:no Feet problems:no.  Doing "fine and dandy."   Blood Sugars averaging:80-120 usually in the AMs recently.  eye exam within last year:  Due.  D/w pt.  A1c down to 8.  Better than prev.   Working on diet, cut out some carbs.  Short of breath, at baseline.  Worse outside with the weather changes, with the recent heat wave.    He hadn't used NTG recently.  Needed a refill.    Back pain is better, didn't need prev prednisone rx.  occ using tramadol prn for pain, w/o ADE, with some relief.    PMH and SH reviewed  Meds, vitals, and allergies reviewed.   ROS: See HPI.  Otherwise negative.    GEN: nad, alert and oriented, on O2, overweight HEENT: mucous membranes moist NECK: supple w/o LA CV: rrr. PULM: dec in BS B but o/w ctab, no inc wob ABD: soft, +bs EXT: soft pitting 2+ edema, no erythema SKIN: no acute rash

## 2013-12-16 NOTE — Patient Instructions (Addendum)
Please call about seeing the lung clinic.  Change your appointment here with me to October.  Labs ahead of time.  Take care. Glad to see you.  Try to keep working on your diet.

## 2013-12-17 NOTE — Assessment & Plan Note (Signed)
Improved, A1c, working on diet.  Not a need to change meds at this point, with his diet work. A1c may improve some in the next few months.  Recheck in ~04/2014.  He agrees.

## 2013-12-17 NOTE — Assessment & Plan Note (Signed)
Encouraged f/u with pulmonary, caution re: heat, continue O2.

## 2013-12-17 NOTE — Assessment & Plan Note (Signed)
D/w pt, encouraged gradual weight loss.

## 2013-12-18 ENCOUNTER — Other Ambulatory Visit: Payer: Self-pay | Admitting: Family Medicine

## 2013-12-26 ENCOUNTER — Other Ambulatory Visit: Payer: Self-pay | Admitting: Family Medicine

## 2013-12-27 NOTE — Telephone Encounter (Signed)
Sent!

## 2013-12-29 NOTE — Telephone Encounter (Signed)
Pt called to ck status of refills; advised pt done.

## 2013-12-30 ENCOUNTER — Encounter: Payer: Self-pay | Admitting: Family Medicine

## 2013-12-30 NOTE — Telephone Encounter (Signed)
This encounter was created in error - please disregard.

## 2014-01-05 NOTE — Telephone Encounter (Signed)
This encounter was created in error - please disregard.

## 2014-02-03 ENCOUNTER — Ambulatory Visit (INDEPENDENT_AMBULATORY_CARE_PROVIDER_SITE_OTHER): Payer: Commercial Managed Care - HMO | Admitting: Cardiovascular Disease

## 2014-02-03 ENCOUNTER — Encounter: Payer: Self-pay | Admitting: Cardiovascular Disease

## 2014-02-03 VITALS — BP 124/62 | HR 63 | Ht 65.5 in | Wt 257.8 lb

## 2014-02-03 DIAGNOSIS — I5032 Chronic diastolic (congestive) heart failure: Secondary | ICD-10-CM

## 2014-02-03 DIAGNOSIS — M542 Cervicalgia: Secondary | ICD-10-CM

## 2014-02-03 DIAGNOSIS — I25118 Atherosclerotic heart disease of native coronary artery with other forms of angina pectoris: Secondary | ICD-10-CM

## 2014-02-03 DIAGNOSIS — I701 Atherosclerosis of renal artery: Secondary | ICD-10-CM

## 2014-02-03 DIAGNOSIS — I2510 Atherosclerotic heart disease of native coronary artery without angina pectoris: Secondary | ICD-10-CM

## 2014-02-03 DIAGNOSIS — I509 Heart failure, unspecified: Secondary | ICD-10-CM

## 2014-02-03 DIAGNOSIS — I10 Essential (primary) hypertension: Secondary | ICD-10-CM

## 2014-02-03 DIAGNOSIS — E1149 Type 2 diabetes mellitus with other diabetic neurological complication: Secondary | ICD-10-CM

## 2014-02-03 DIAGNOSIS — I209 Angina pectoris, unspecified: Secondary | ICD-10-CM

## 2014-02-03 DIAGNOSIS — R609 Edema, unspecified: Secondary | ICD-10-CM

## 2014-02-03 NOTE — Assessment & Plan Note (Signed)
Currently with no symptoms of angina. No further workup at this time. Continue current medication regimen. 

## 2014-02-03 NOTE — Assessment & Plan Note (Signed)
Blood pressure is well controlled on today's visit. No changes made to the medications. 

## 2014-02-03 NOTE — Progress Notes (Signed)
Patient ID: Stephen [name], male    DOB: 02/16/1946, 68 y.o.   MRN: [id number]  HPI Comments: Stephen [name] is a very pleasant 89 year old gentleman with a history of coronary artery disease, PCI to the mid RCA and mid left circumflex in October 2007, interstitial fibrosis, with wedge resection, hypertension, hyperlipidemia, remote tobacco abuse, obesity, chronic substernal chest pain who presents for routine followup. History of  diabetes Open lung biopsy was performed November 2009 for bilateral pulmonary infiltrates. Status post stent to his left renal artery for 80+ percent stenosis several weeks ago, performed by Dr. Lucky Cowboy. Also had laser treatment of the lower extremity veins for leg swelling/venous insufficiency He presents for routine followup  In followup today, he reports that he is doing well. He has chronic stable shortness of breath, typically uses one to 2 L on a regular basis. He does report some discomfort in the right side of his neck over the past 2 weeks. Tender to palpation  One episode of chest pain 3 weeks ago that was in the central part of his chest, substernal. No further episodes since that time. He reports having possibly 3 episodes of chest pain this year.  Recent lab work showed total cholesterol 120, hemoglobin A1c 8.0 Continues to eat bread and potatoes  Stress test July 2011 was dobutamine stress that showed no significant ischemia, inadequate heart rate was achieved. right and left heart catheterization January 2011 showed 50% mid LAD disease, 60% at the ostium of the diagonal #2, 30% proximal left circumflex disease, patent stent in the left circumflex, patent stent of the mid RCA, wedge pressure of 16, PA pressure mean 22, right ventricular pressure 41/8 .   echocardiogram September 2010 shows normal systolic function, diastolic dysfunction, normal RV function unable to evaluate right ventricular systolic pressures  In followup today, He reports that overall he  is doing well.  He does report episode of shortness of breath several months ago while dragging tree branches while doing cleanup in his yard after a storm. Typically breathing has been stable.   EKG shows normal sinus rhythm with rate 63 and beats per minute with poor R-wave progression to the anterior precordial leads, consider old anterior MI, left axis deviation, nonspecific ST and T wave abnormality in leads one and aVL    Outpatient Encounter Prescriptions as of 02/03/2014  Medication Sig  . aspirin 81 MG tablet Take 81 mg by mouth 2 (two) times daily.    Marland Kitchen atorvastatin (LIPITOR) 80 MG tablet TAKE 1 TABLET AT BEDTIME  . carvedilol (COREG) 25 MG tablet TAKE 1 TABLET TWICE DAILY WITH A MEAL  . cloNIDine (CATAPRES) 0.3 MG tablet Take 1 tablet (0.3 mg total) by mouth 3 (three) times daily.  . clopidogrel (PLAVIX) 75 MG tablet TAKE ONE TABLET BY MOUTH EVERY DAY  . finasteride (PROSCAR) 5 MG tablet Once daily  . furosemide (LASIX) 80 MG tablet TAKE 1 TABLET EVERY OTHER DAY  . gabapentin (NEURONTIN) 800 MG tablet TAKE 1 TABLET IN THE MORNING  AND TAKE 1 AND 1/2 TABLETS AT BEDTIME  . glucose blood (ONE TOUCH TEST STRIPS) test strip Ck blood sugar three times daily and as needed. Dx. 250.62  . hydrALAZINE (APRESOLINE) 25 MG tablet Take 25 mg by mouth 3 (three) times daily.  . Insulin Syringe-Needle U-100 (INSULIN SYRINGE 1CC/30GX5/16") 30G X 5/16" 1 ML MISC Use as directed to take insulin Dx 250.62  . isosorbide mononitrate (IMDUR) 60 MG 24 hr tablet Take 1 tablet (60  mg total) by mouth 2 (two) times daily.  . nitroGLYCERIN (NITROSTAT) 0.4 MG SL tablet Place 1 tablet (0.4 mg total) under the tongue every 5 (five) minutes as needed. May repeat x3  . NOVOLIN 70/30 (70-30) 100 UNIT/ML injection INJECT  70 UNITS SUBCUTANEOUSLY EVERY MORNING  AND  35 UNITS IN THE EVENING  . omeprazole (PRILOSEC) 20 MG capsule Take 20 mg by mouth daily.    . traMADol (ULTRAM) 50 MG tablet Take 1 tablet (50 mg total) by  mouth every 12 (twelve) hours as needed for severe pain.  Marland Kitchen triamterene-hydrochlorothiazide (MAXZIDE-25) 37.5-25 MG per tablet Take 1 tablet by mouth daily.    Review of Systems  Constitutional: Negative.   HENT: Negative.   Eyes: Negative.   Respiratory: Positive for shortness of breath.   Cardiovascular: Positive for leg swelling.  Gastrointestinal: Negative.   Endocrine: Negative.   Musculoskeletal: Negative.   Skin: Negative.   Allergic/Immunologic: Negative.   Neurological: Negative.   Hematological: Negative.   Psychiatric/Behavioral: Negative.   All other systems reviewed and are negative.  BP 124/62  Pulse 63  Ht 5' 5.5" (1.664 m)  Wt 257 lb 12 oz (116.915 kg)  BMI 42.22 kg/m2  Physical Exam  Nursing note and vitals reviewed. Constitutional: He is oriented to person, place, and time. He appears well-developed and well-nourished.  Morbidly obese.  HENT:  Head: Normocephalic.  Nose: Nose normal.  Mouth/Throat: Oropharynx is clear and moist.  Eyes: Conjunctivae are normal. Pupils are equal, round, and reactive to light.  Neck: Normal range of motion. Neck supple. No JVD present.  Cardiovascular: Normal rate, regular rhythm, S1 normal, S2 normal, normal heart sounds and intact distal pulses.  Exam reveals no gallop and no friction rub.   No murmur heard. Trace  edema of the right lower extremity   Pulmonary/Chest: Effort normal and breath sounds normal. No respiratory distress. He has no wheezes. He has no rales. He exhibits no tenderness.  Abdominal: Soft. Bowel sounds are normal. He exhibits no distension. There is no tenderness.  Musculoskeletal: Normal range of motion. He exhibits edema. He exhibits no tenderness.  Lymphadenopathy:    He has no cervical adenopathy.  Neurological: He is alert and oriented to person, place, and time. Coordination normal.  Skin: Skin is warm and dry. No rash noted. No erythema.  Psychiatric: He has a normal mood and affect. His  behavior is normal. Judgment and thought content normal.      Assessment and Plan

## 2014-02-03 NOTE — Assessment & Plan Note (Signed)
Hemoglobin A1c continues to run high. We talked to him about his diet and the need to avoid potatoes and bread

## 2014-02-03 NOTE — Patient Instructions (Signed)
You are doing well. No medication changes were made.  PLEASE CALL IF YOU HAVE MORE CHEST PAIN TAKE NTG FOR CHEST PAIN  Please call us if you have new issues that need to be addressed before your next appt.  Your physician wants you to follow-up in: 6 months.  You will receive a reminder letter in the mail two months in advance. If you don't receive a letter, please call our office to schedule the follow-up appointment.

## 2014-02-03 NOTE — Assessment & Plan Note (Signed)
We have encouraged continued exercise, careful diet management in an effort to lose weight. 

## 2014-02-03 NOTE — Assessment & Plan Note (Signed)
Breathing stable,  diastolic CHF has been stable on Lasix every other day

## 2014-02-03 NOTE — Assessment & Plan Note (Signed)
Chronic venous insufficiency. Recommended he continue to wear his compression hose, leg elevation

## 2014-02-03 NOTE — Assessment & Plan Note (Signed)
Followed by Dr. Lucky Cowboy. We'll continue aggressive cholesterol management. Goal LDL less than 70

## 2014-03-19 ENCOUNTER — Ambulatory Visit: Payer: Commercial Managed Care - HMO | Admitting: Family Medicine

## 2014-04-03 ENCOUNTER — Encounter: Payer: Self-pay | Admitting: Family Medicine

## 2014-04-03 ENCOUNTER — Ambulatory Visit (INDEPENDENT_AMBULATORY_CARE_PROVIDER_SITE_OTHER): Payer: Commercial Managed Care - HMO | Admitting: Family Medicine

## 2014-04-03 VITALS — BP 120/60 | HR 67 | Temp 98.0°F | Wt 255.1 lb

## 2014-04-03 DIAGNOSIS — M5442 Lumbago with sciatica, left side: Secondary | ICD-10-CM

## 2014-04-03 DIAGNOSIS — Z23 Encounter for immunization: Secondary | ICD-10-CM

## 2014-04-03 DIAGNOSIS — M5441 Lumbago with sciatica, right side: Secondary | ICD-10-CM | POA: Insufficient documentation

## 2014-04-03 MED ORDER — HYDROCODONE-ACETAMINOPHEN 5-325 MG PO TABS: 1.0000 | ORAL_TABLET | Freq: Four times a day (QID) | ORAL | Status: DC | PRN

## 2014-04-03 MED ORDER — GABAPENTIN 800 MG PO TABS: ORAL_TABLET | ORAL | Status: DC

## 2014-04-03 NOTE — Patient Instructions (Addendum)
Keep using heat and try the back brace.  Use hydrocodone as needed- sedation and constipation caution.  Take care. Glad to see you.

## 2014-04-03 NOTE — Progress Notes (Signed)
Pre visit review using our clinic review tool, if applicable. No additional management support is needed unless otherwise documented below in the visit note.  Back pain. Starts in middle of lower back and then goes down into the L leg, "like a cramp that doesn't go away."  No trauma, no falls.  Had been working on a mower recently, unclear if that contributed, he didn't feel anything different at the time.  He was asking about a back brace.  Nauseated from the pain.  No fevers.  No rash.  No B/B sx.    Meds, vitals, and allergies reviewed.   ROS: See HPI.  Otherwise, noncontributory.  nad but uncomfortable from back pain On O2 via [place] Mmm rrr No focal dec in BS, no inc in wob abd soft Back w/o midline pain but L SLR positive.  Midline of lower back ttp, ttp L of midline No rash No bruising No weakness in BLE

## 2014-04-05 ENCOUNTER — Emergency Department (HOSPITAL_COMMUNITY): Payer: Medicare HMO

## 2014-04-05 ENCOUNTER — Emergency Department (HOSPITAL_COMMUNITY)
Admission: EM | Admit: 2014-04-05 | Discharge: 2014-04-05 | Disposition: A | Discharge status: Home / Self Care | Payer: Medicare HMO | Attending: Emergency Medicine | Admitting: Emergency Medicine

## 2014-04-05 ENCOUNTER — Encounter (HOSPITAL_COMMUNITY): Payer: Self-pay | Admitting: Emergency Medicine

## 2014-04-05 DIAGNOSIS — I509 Heart failure, unspecified: Secondary | ICD-10-CM | POA: Diagnosis not present

## 2014-04-05 DIAGNOSIS — E1140 Type 2 diabetes mellitus with diabetic neuropathy, unspecified: Secondary | ICD-10-CM | POA: Diagnosis not present

## 2014-04-05 DIAGNOSIS — Z7982 Long term (current) use of aspirin: Secondary | ICD-10-CM | POA: Insufficient documentation

## 2014-04-05 DIAGNOSIS — I2510 Atherosclerotic heart disease of native coronary artery without angina pectoris: Secondary | ICD-10-CM | POA: Insufficient documentation

## 2014-04-05 DIAGNOSIS — Z79899 Other long term (current) drug therapy: Secondary | ICD-10-CM | POA: Insufficient documentation

## 2014-04-05 DIAGNOSIS — Z794 Long term (current) use of insulin: Secondary | ICD-10-CM | POA: Insufficient documentation

## 2014-04-05 DIAGNOSIS — Z8669 Personal history of other diseases of the nervous system and sense organs: Secondary | ICD-10-CM | POA: Diagnosis not present

## 2014-04-05 DIAGNOSIS — Z85828 Personal history of other malignant neoplasm of skin: Secondary | ICD-10-CM | POA: Diagnosis not present

## 2014-04-05 DIAGNOSIS — K219 Gastro-esophageal reflux disease without esophagitis: Secondary | ICD-10-CM | POA: Diagnosis not present

## 2014-04-05 DIAGNOSIS — M1990 Unspecified osteoarthritis, unspecified site: Secondary | ICD-10-CM | POA: Diagnosis not present

## 2014-04-05 DIAGNOSIS — M5430 Sciatica, unspecified side: Secondary | ICD-10-CM | POA: Insufficient documentation

## 2014-04-05 DIAGNOSIS — E079 Disorder of thyroid, unspecified: Secondary | ICD-10-CM | POA: Diagnosis not present

## 2014-04-05 DIAGNOSIS — Z86010 Personal history of colonic polyps: Secondary | ICD-10-CM | POA: Insufficient documentation

## 2014-04-05 DIAGNOSIS — M545 Low back pain: Secondary | ICD-10-CM | POA: Diagnosis present

## 2014-04-05 DIAGNOSIS — E785 Hyperlipidemia, unspecified: Secondary | ICD-10-CM | POA: Diagnosis not present

## 2014-04-05 DIAGNOSIS — R110 Nausea: Secondary | ICD-10-CM | POA: Insufficient documentation

## 2014-04-05 DIAGNOSIS — I10 Essential (primary) hypertension: Secondary | ICD-10-CM | POA: Diagnosis not present

## 2014-04-05 DIAGNOSIS — Z79891 Long term (current) use of opiate analgesic: Secondary | ICD-10-CM | POA: Diagnosis not present

## 2014-04-05 DIAGNOSIS — M5442 Lumbago with sciatica, left side: Secondary | ICD-10-CM

## 2014-04-05 DIAGNOSIS — Z87442 Personal history of urinary calculi: Secondary | ICD-10-CM | POA: Diagnosis not present

## 2014-04-05 MED ORDER — OXYCODONE-ACETAMINOPHEN 5-325 MG PO TABS: 1.0000 | ORAL_TABLET | Freq: Four times a day (QID) | ORAL | Status: DC | PRN

## 2014-04-05 MED ORDER — ONDANSETRON 4 MG PO TBDP
4.0000 mg | ORAL_TABLET | Freq: Once | ORAL | Status: AC
  Administered 2014-04-05: 4 mg via ORAL
  Filled 2014-04-05: qty 1

## 2014-04-05 MED ORDER — MORPHINE SULFATE 4 MG/ML IJ SOLN
4.0000 mg | Freq: Once | INTRAMUSCULAR | Status: AC
  Administered 2014-04-05: 4 mg via INTRAMUSCULAR
  Filled 2014-04-05: qty 1

## 2014-04-05 MED ORDER — CYCLOBENZAPRINE HCL 10 MG PO TABS: 10.0000 mg | ORAL_TABLET | Freq: Two times a day (BID) | ORAL | Status: DC | PRN

## 2014-04-05 NOTE — ED Notes (Signed)
He c/o lower back pain radiating down L leg. He has a history of back pain. He saw his PCP Friday and was given vicodin for pain with no relief. No bowel/bladder changes. Ambulatory with pain. Describes pain as "muscle spasms."

## 2014-04-05 NOTE — Assessment & Plan Note (Signed)
rx written for back brace/belt, he'll check at DME supplier. It may help a little.  Hydrocodone prn pain in the meantime with routine cautions.  He agrees.  We didn't image at this point as it wouldn't likely change mgmt.  He'll update me as needed.  Would like to avoid prednisone if at all possible.

## 2014-04-05 NOTE — ED Provider Notes (Signed)
CSN: [id number]     Arrival date & time 04/05/14  1733 History  This chart was scribed for non-physician practitioner, Randa Lynn. Carlota Raspberry, PA-C working with Malvin Johns, MD by Tula Nakayama, ED scribe. This patient was seen in room TR07C/TR07C and the patient's care was started at 6:35 PM  Chief Complaint  Patient presents with  . Back Pain   The history is provided by the patient. No language interpreter was used.   HPI Comments: Stephen [name] is a 68 y.o. male who presents to the Emergency Department complaining of constant, gradually worsening lower, left lumbar back pain radiating down his left leg and left foot that started one week ago. He notes nausea for one week and a subjective fever today as associated symptoms. Pt states pain becomes worse with standing and walking and does not change with sitting. He tried Norco prescribed by PCP for arthritis with no relief to his symptoms. Pt denies weakness or incontinence. Pt has a history of back pain four months ago when he was working in his garden. He states past pain improved with pain medication after a few weeks. Pt notes tingling in toes, but believes this is related to DM. He also has a history of kidney stones, but states that his current symptoms are not similar. No bowel or urine incontinence   Past Medical History  Diagnosis Date  . 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 DM2 (diabetes mellitus, type 2)     insulin requiring  . Diabetes mellitus with neuropathy   . OSA (obstructive sleep apnea)   . HTN (hypertension)   . CHF (congestive heart failure)   . Kidney stones   . Dyslipidemia   . Metabolic syndrome   . Arthritis   . GERD (gastroesophageal reflux disease)   . Heart disease   . High cholesterol   . Kidney disease   . Colon polyps   . Thyroid disorder   . Recurrent UTI   . 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 Adrenal gland anomaly   . Skin cancer     head  . Rotator cuff disorder     right  . Renal artery stenosis     L sided, stent 07/2013   Past Surgical History  Procedure Laterality Date  . 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 Coronary angioplasty with stent placement  2007    x 2  . Knee surgery      right  . Lung biopsy    . Carpal tunnel release      bilateral  . Skin cancer excision      back of head  . Renal artery stent  2015    L   Family History  Problem Relation Age of Onset  . Heart attack Father   . Heart attack Mother   . Arthritis Maternal Grandmother   . Breast cancer Maternal Aunt   . Diabetes Maternal Aunt   . Hypertension Other   . Heart attack Brother   . Hypertension Maternal Aunt   . COPD Sister    History  Substance Use Topics  . Smoking status: Never Smoker   . Smokeless tobacco: Never Used  . Alcohol Use: No    Review of Systems  Constitutional: Positive for fever (Subjective).  Gastrointestinal: Positive for nausea.  Musculoskeletal:  Positive for back pain.  Neurological: Negative for weakness.  All other systems reviewed and are negative.     Allergies  Calcium channel blockers  Home Medications   Prior to Admission medications   Medication Sig Start Date End Date Taking? Authorizing Provider  aspirin 81 MG tablet Take 81 mg by mouth 2 (two) times daily.      Historical Provider, MD  atorvastatin (LIPITOR) 80 MG tablet TAKE 1 TABLET AT BEDTIME    [name], MD  carvedilol (COREG) 25 MG tablet TAKE 1 TABLET TWICE DAILY WITH A MEAL    [name], MD  cloNIDine (CATAPRES) 0.3 MG tablet Take 1 tablet (0.3 mg total) by mouth 3 (three) times daily. 10/23/13 05/25/16  Minna Merritts, MD  clopidogrel (PLAVIX) 75 MG tablet TAKE ONE TABLET BY MOUTH EVERY DAY 07/11/13   Minna Merritts, MD  cyclobenzaprine (FLEXERIL) 10 MG tablet Take 1 tablet (10 mg total) by mouth 2 (two) times daily as needed for muscle spasms.  04/05/14   Linus Mako, PA-C  finasteride (PROSCAR) 5 MG tablet Once daily 12/27/10   Historical Provider, MD  furosemide (LASIX) 80 MG tablet TAKE 1 TABLET EVERY OTHER DAY    [name], MD  gabapentin (NEURONTIN) 800 MG tablet TAKE 1 TABLET IN THE MORNING  AND TAKE 1 AND 1/2 TABLETS AT BEDTIME 04/03/14   [name], MD  glucose blood (ONE TOUCH TEST STRIPS) test strip Ck blood sugar three times daily and as needed. Dx. 250.62 09/16/13   [name], MD  hydrALAZINE (APRESOLINE) 25 MG tablet Take 25 mg by mouth 3 (three) times daily. 11/13/12   [name], MD  HYDROcodone-acetaminophen (NORCO/VICODIN) 5-325 MG per tablet Take 1 tablet by mouth every 6 (six) hours as needed for moderate pain or severe pain. 04/03/14   [name], MD  Insulin Syringe-Needle U-100 (INSULIN SYRINGE 1CC/30GX5/16") 30G X 5/16" 1 ML MISC Use as directed to take insulin Dx 250.62 07/14/13   Ria Bush, MD  isosorbide mononitrate (IMDUR) 60 MG 24 hr tablet Take 1 tablet (60 mg total) by mouth 2 (two) times daily. 07/11/13   Minna Merritts, MD  nitroGLYCERIN (NITROSTAT) 0.4 MG SL tablet Place 1 tablet (0.4 mg total) under the tongue every 5 (five) minutes as needed. May repeat x3 12/16/13   [name], MD  NOVOLIN 70/30 (70-30) 100 UNIT/ML injection INJECT  70 UNITS SUBCUTANEOUSLY EVERY MORNING  AND  35 UNITS IN THE EVENING    [name], MD  omeprazole (PRILOSEC) 20 MG capsule Take 20 mg by mouth daily.      Historical Provider, MD  oxyCODONE-acetaminophen (PERCOCET/ROXICET) 5-325 MG per tablet Take 1-2 tablets by mouth every 6 (six) hours as needed. 04/05/14   Dave Mergen Marilu Favre, PA-C  traMADol (ULTRAM) 50 MG tablet Take 1 tablet (50 mg total) by mouth every 12 (twelve) hours as needed for severe pain. 12/16/13   [name], MD  triamterene-hydrochlorothiazide (MAXZIDE-25) 37.5-25 MG per tablet Take 1 tablet by mouth daily. 07/11/13   Minna Merritts, MD   BP 158/70  Pulse 81   Temp(Src) 98.6 F (37 C) (Oral)  Resp 16  SpO2 95% Physical Exam  Nursing note and vitals reviewed. Constitutional: He appears well-developed and well-nourished. No distress.  HENT:  Head: Normocephalic and atraumatic.  Eyes: Conjunctivae and EOM are normal.  Neck: Neck supple. No tracheal deviation present.  Cardiovascular: Normal rate.  Pulmonary/Chest: Effort normal. No respiratory distress.  Abdominal: Bowel sounds are normal. There is no tenderness. There is no rebound and no guarding.  Musculoskeletal:  Pt has equal strength to bilateral lower extremities.  Neurosensory function adequate to both legs No clonus on dorsiflextion Skin color is normal. Skin is warm and moist.  I see no step off deformity, no midline bony tenderness.  Pt is able to ambulate.  No crepitus, laceration, effusion, induration, lesions, swelling.   Pedal pulses are symmetrical and palpable bilaterally  Left side tenderness to palpation of paraspinel muscles and left hip   Skin: Skin is warm and dry.  Psychiatric: He has a normal mood and affect. His behavior is normal.    ED Course  Procedures (including critical care time) DIAGNOSTIC STUDIES: Oxygen Saturation is 95% on RA, adequate by my interpretation.    COORDINATION OF CARE: 6:45 PM Will order x-ray of lumbar spine, Zofran, and morphine. Discussed treatment plan with pt at bedside and pt agreed to plan.   Labs Review Labs Reviewed - No data to display  Imaging Review Dg Lumbar Spine Complete  04/05/2014   CLINICAL DATA:  Low back pain with radiation to left lower extremity 1-2 weeks.  EXAM: LUMBAR SPINE - COMPLETE 4+ VIEW  COMPARISON:  None.  FINDINGS: Examination demonstrates moderate spondylosis throughout the lumbar spine and visualized lower thoracic spine. Vertebral body heights are maintained. There is a transitional vertebrae at the lumbosacral junction which will be designated L6. There is disc space narrowing at the L4-5, L5-L6  and L6-S1 levels. There is mild grade 1 anterolisthesis of L5 on L6 due to the moderate facet arthropathy. There is no acute compression fracture. There are surgical clips over the right upper quadrant. Calcified plaque is present over the abdominal aorta and iliac arteries.  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 No acute findings.   Electronically Signed   By: Marin Olp M.D.   On: 04/05/2014 19:31     EKG Interpretation None      MDM   Final diagnoses:  Left-sided low back pain with left-sided sciatica    He has had significant pain relief with the 4 mg IM shot of Morphine. He does not have numbess or weakness to that side. No testicular pain, or abdominal pains. Dr. Tamera Punt has seen patient as well and agrees with my treatment and plan. Advises that he needs to follow-up with his PCP because  xrays do not r/o pinched nerves, that MRIs do this and he may need one if he is not getting better or symptoms are worsening.  Rx: Percocet and Flexeril   68 y.o.Okey Regal [place]'s evaluation in the Emergency Department is complete. It has been determined that no acute conditions requiring further emergency intervention are present at this time. The patient/guardian have been advised of the diagnosis and plan. We have discussed signs and symptoms that warrant return to the ED, such as changes or worsening in symptoms.  Vital signs are stable at discharge. Filed Vitals:   04/05/14 1751  BP: 158/70  Pulse: 81  Temp: 98.6 F (37 C)  Resp: 16    Patient/guardian has voiced understanding and agreed to follow-up with the PCP or specialist.    Linus Mako, PA-C 04/05/14 2007

## 2014-04-05 NOTE — Discharge Instructions (Signed)
Sciatica Sciatica is pain, weakness, numbness, or tingling along the path of the sciatic nerve. The nerve starts in the lower back and runs down the back of each leg. The nerve controls the muscles in the lower leg and in the back of the knee, while also providing sensation to the back of the thigh, lower leg, and the sole of your foot. Sciatica is a symptom of another medical condition. For instance, nerve damage or certain conditions, such as a herniated disk or bone spur on the spine, pinch or put pressure on the sciatic nerve. This causes the pain, weakness, or other sensations normally associated with sciatica. Generally, sciatica only affects one side of the body. CAUSES   Herniated or slipped disc.  Degenerative disk disease.  A pain disorder involving the narrow muscle in the buttocks (piriformis syndrome).  Pelvic injury or fracture.  Pregnancy.  Tumor (rare). SYMPTOMS  Symptoms can vary from mild to very severe. The symptoms usually travel from the low back to the buttocks and down the back of the leg. Symptoms can include:  Mild tingling or dull aches in the lower back, leg, or hip.  Numbness in the back of the calf or sole of the foot.  Burning sensations in the lower back, leg, or hip.  Sharp pains in the lower back, leg, or hip.  Leg weakness.  Severe back pain inhibiting movement. These symptoms may get worse with coughing, sneezing, laughing, or prolonged sitting or standing. Also, being overweight may worsen symptoms. DIAGNOSIS  Your caregiver will perform a physical exam to look for common symptoms of sciatica. He or she may ask you to do certain movements or activities that would trigger sciatic nerve pain. Other tests may be performed to find the cause of the sciatica. These may include:  Blood tests.  X-rays.  Imaging tests, such as an MRI or CT scan. TREATMENT  Treatment is directed at the cause of the sciatic pain. Sometimes, treatment is not necessary  and the pain and discomfort goes away on its own. If treatment is needed, your caregiver may suggest:  Over-the-counter medicines to relieve pain.  Prescription medicines, such as anti-inflammatory medicine, muscle relaxants, or narcotics.  Applying heat or ice to the painful area.  Steroid injections to lessen pain, irritation, and inflammation around the nerve.  Reducing activity during periods of pain.  Exercising and stretching to strengthen your abdomen and improve flexibility of your spine. Your caregiver may suggest losing weight if the extra weight makes the back pain worse.  Physical therapy.  Surgery to eliminate what is pressing or pinching the nerve, such as a bone spur or part of a herniated disk. HOME CARE INSTRUCTIONS   Only take over-the-counter or prescription medicines for pain or discomfort as directed by your caregiver.  Apply ice to the affected area for 20 minutes, 3-4 times a day for the first 48-72 hours. Then try heat in the same way.  Exercise, stretch, or perform your usual activities if these do not aggravate your pain.  Attend physical therapy sessions as directed by your caregiver.  Keep all follow-up appointments as directed by your caregiver.  Do not wear high heels or shoes that do not provide proper support.  Check your mattress to see if it is too soft. A firm mattress may lessen your pain and discomfort. SEEK IMMEDIATE MEDICAL CARE IF:   You lose control of your bowel or bladder (incontinence).  You have increasing weakness in the lower back, pelvis, buttocks,   or legs.  You have redness or swelling of your back.  You have a burning sensation when you urinate.  You have pain that gets worse when you lie down or awakens you at night.  Your pain is worse than you have experienced in the past.  Your pain is lasting longer than 4 weeks.  You are suddenly losing weight without reason. MAKE SURE YOU:  Understand these  instructions.  Will watch your condition.  Will get help right away if you are not doing well or get worse. Document Released: 06/13/2001 Document Revised: 12/19/2011 Document Reviewed: 10/29/2011 Exit[employer] Patient Information 2015 [employer]. This information is not intended to replace advice given to you by your health care provider. Make sure you discuss any questions you have with your health care provider.  

## 2014-04-05 NOTE — ED Provider Notes (Signed)
Medical screening examination/treatment/procedure(s) were conducted as a shared visit with non-physician practitioner(s) and myself.  I personally evaluated the patient during the encounter.   EKG Interpretation None      Pt with left back pain, radiating to sciatic area.  No neuro deficits.  No abd pain.  Worse with movement.  No dizziness, vomiting.  Pt has reproducible tenderness over lower back and sciatic area.  Malvin Johns, MD 04/05/14 [phone]

## 2014-04-10 ENCOUNTER — Other Ambulatory Visit: Payer: Self-pay

## 2014-04-10 MED ORDER — HYDROCODONE-ACETAMINOPHEN 5-325 MG PO TABS: 1.0000 | ORAL_TABLET | Freq: Four times a day (QID) | ORAL | Status: DC | PRN

## 2014-04-10 NOTE — Telephone Encounter (Signed)
Per pharmacy pt received #30 hydrocodone 10/2 and #20 percocet from the ED 10/5. Dr [name] states he will contact pt directly to discuss medication.

## 2014-04-10 NOTE — Telephone Encounter (Signed)
Pt was seen in [facility] ED on 04/05/14 for back pain and lt leg pain; pt was given oxycodone apap 5-325 mg. Pt has been taking 1 tab q 6 h for pain and that is not completely relieving pain. Pt has one pill left and request his wife to come and pick up rx this afternoon. Pt does not need refill on cyclobenzaprine. Pt has not scheduled f/u with Dr [name] after ED visit because pt is hurting so badly he has not been away from home.Please advise.

## 2014-04-10 NOTE — Telephone Encounter (Signed)
I called pt about this.  He has the hydrocodone left over, he didn't take very many at all.  He was running low on the oxycodone from the ER.  I advised him to take the hydrocodone over the weekend as needed, 1-2 tabs at a time.  He'll update me later on. He agrees.  The patient didn't appear to do anything questionable.  He thanked me for the call.

## 2014-04-10 NOTE — Addendum Note (Signed)
Addended by: [name] on: 04/10/2014 04:53 PM   Modules accepted: Orders, Medications

## 2014-04-19 ENCOUNTER — Other Ambulatory Visit: Payer: Self-pay | Admitting: Family Medicine

## 2014-04-19 DIAGNOSIS — E1140 Type 2 diabetes mellitus with diabetic neuropathy, unspecified: Secondary | ICD-10-CM

## 2014-04-20 ENCOUNTER — Other Ambulatory Visit (INDEPENDENT_AMBULATORY_CARE_PROVIDER_SITE_OTHER): Payer: Commercial Managed Care - HMO

## 2014-04-20 ENCOUNTER — Other Ambulatory Visit: Payer: Commercial Managed Care - HMO

## 2014-04-20 DIAGNOSIS — E1140 Type 2 diabetes mellitus with diabetic neuropathy, unspecified: Secondary | ICD-10-CM

## 2014-04-20 LAB — BASIC METABOLIC PANEL
BUN: 23 mg/dL (ref 6–23)
CO2: 28 mEq/L (ref 19–32)
Calcium: 8.6 mg/dL (ref 8.4–10.5)
Chloride: 104 mEq/L (ref 96–112)
Creatinine, Ser: 2 mg/dL — ABNORMAL HIGH (ref 0.4–1.5)
GFR: 36.53 mL/min — ABNORMAL LOW (ref >60.00)
Glucose, Bld: 53 mg/dL — ABNORMAL LOW (ref 70–99)
Potassium: 3.8 mEq/L (ref 3.5–5.1)
Sodium: 139 mEq/L (ref 135–145)

## 2014-04-20 LAB — HEMOGLOBIN A1C: Hgb A1c MFr Bld: 8 % — ABNORMAL HIGH (ref 4.6–6.5)

## 2014-04-23 ENCOUNTER — Encounter: Payer: Self-pay | Admitting: Family Medicine

## 2014-04-23 ENCOUNTER — Ambulatory Visit (INDEPENDENT_AMBULATORY_CARE_PROVIDER_SITE_OTHER): Payer: Commercial Managed Care - HMO | Admitting: Family Medicine

## 2014-04-23 VITALS — BP 152/84 | HR 58 | Temp 97.7°F | Wt 260.8 lb

## 2014-04-23 DIAGNOSIS — M5442 Lumbago with sciatica, left side: Secondary | ICD-10-CM

## 2014-04-23 DIAGNOSIS — E1140 Type 2 diabetes mellitus with diabetic neuropathy, unspecified: Secondary | ICD-10-CM

## 2014-04-23 MED ORDER — OXYCODONE-ACETAMINOPHEN 5-325 MG PO TABS: 1.0000 | ORAL_TABLET | Freq: Four times a day (QID) | ORAL | Status: DC | PRN

## 2014-04-23 NOTE — Progress Notes (Signed)
Pre visit review using our clinic review tool, if applicable. No additional management support is needed unless otherwise documented below in the visit note.  Diabetes:  Using medications without difficulties:no Hypoglycemic episodes: occ Hyperglycemic episodes: occ, 55 here on prev labs. Didn't have a snack the night before and that was atypical.  Feet problems: normal sensation per patient report.  Blood Sugars averaging: usually ~ 80-130.  occ higher later in the day.  A1c d/w pt.   Back pain. Better now, no leg pain.  To clarify, he wasn't med seeking prev and didn't do anything questionable.  He had more benefit from oxycodone than hydrocodone.  He had had a few flares episodically.  Prev imaging with moderate spondylosis of the lumbar spine with disc disease at several levels of the lower lumbar spine. No acute findings.  Film reviewed with patient.   Weight is up and down.  Had skipped lasix due to MD appointments.  D/w pt.    He has renal f/u pending.   Meds, vitals, and allergies reviewed.   ROS: See HPI.  Otherwise negative.    GEN: nad, alert and oriented, on O2, obese HEENT: mucous membranes moist NECK: supple w/o LA CV: rrr. PULM: ctab, no inc wob ABD: soft, +bs EXT: 1+ BLE edema SKIN: no acute rash  Diabetic foot exam: Normal inspection No skin breakdown No calluses  Normal DP pulses Normal sensation to light touch and monofilament Nails normal

## 2014-04-23 NOTE — Patient Instructions (Signed)
Use the oxycodone if you have another severe back pain flare.  Try to cut the chips at lunch and gradually work on weight loss. Don't change your meds for now.  Take care.  Glad to see you.  Recheck in about 6 months, labs ahead of time.

## 2014-04-24 NOTE — Assessment & Plan Note (Signed)
I gave him an rx for oxycodone in case he has another flare. He isn't needing the pain medicine now, but given his hx and his difficulty getting to clinic, I gave him the rx to hold. He agrees. He'll update me as needed.

## 2014-04-24 NOTE — Assessment & Plan Note (Signed)
Still with elevated A1c.  dw pt.  "It's up to me" (to work on diet).  D/w pt. Offered support.  No change in meds at this point.  He'll try to cut carbs and we'll recheck periodically.  He agrees.

## 2014-06-10 ENCOUNTER — Encounter: Payer: Self-pay | Admitting: Family Medicine

## 2014-06-10 ENCOUNTER — Ambulatory Visit (INDEPENDENT_AMBULATORY_CARE_PROVIDER_SITE_OTHER): Payer: Commercial Managed Care - HMO | Admitting: Family Medicine

## 2014-06-10 VITALS — BP 110/60 | HR 64 | Temp 98.6°F | Wt 265.5 lb

## 2014-06-10 DIAGNOSIS — M5441 Lumbago with sciatica, right side: Secondary | ICD-10-CM

## 2014-06-10 DIAGNOSIS — M5431 Sciatica, right side: Secondary | ICD-10-CM

## 2014-06-10 MED ORDER — OXYCODONE-ACETAMINOPHEN 5-325 MG PO TABS: 1.0000 | ORAL_TABLET | Freq: Four times a day (QID) | ORAL | Status: DC | PRN

## 2014-06-10 MED ORDER — CYCLOBENZAPRINE HCL 10 MG PO TABS: 10.0000 mg | ORAL_TABLET | Freq: Two times a day (BID) | ORAL | Status: DC | PRN

## 2014-06-10 NOTE — Progress Notes (Signed)
Pre visit review using our clinic review tool, if applicable. No additional management support is needed unless otherwise documented below in the visit note.  "It hurts like the devil, doesn't let up."  Oxycodone had helped some prev, but out of med.  Had been using it rarely, trying to get some rest at night.  Pain is right above the belt line and then radiates down the R leg occ.  We talked about options.  No bladder sx.  No FCVD.  No falls.  Less pain leaning forward, more pain with back extension.  Flexeril helped some prev. Sugar was 120 this AM.   Meds, vitals, and allergies reviewed.   ROS: See HPI.  Otherwise, noncontributory.  nad ncat Mmm rrr Ctab, no focal dec in BS On O2 abd soft Obese Normal BS Back w/o midline pain no bruising no rash R SLR positive.  Distally NV intact grossly w/o weakness 1-2+ BLE edema at baseline

## 2014-06-10 NOTE — Patient Instructions (Signed)
Use the oxycodone and flexeril in the meantime.  Vaughan Basta or Rosaria Ferries will work on getting your MRI set up.  We'll go from there.  Take care. Glad to see you.

## 2014-06-10 NOTE — Assessment & Plan Note (Signed)
Refer for MRI.  Oxycodone and flexeril in the meantime, sedation caution.  D/w pt.  He agrees.

## 2014-06-30 ENCOUNTER — Ambulatory Visit: Payer: Self-pay | Admitting: Family Medicine

## 2014-06-30 ENCOUNTER — Encounter: Payer: Self-pay | Admitting: Family Medicine

## 2014-07-01 ENCOUNTER — Other Ambulatory Visit: Payer: Self-pay | Admitting: Family Medicine

## 2014-07-01 DIAGNOSIS — M5442 Lumbago with sciatica, left side: Secondary | ICD-10-CM

## 2014-07-08 DIAGNOSIS — Z6841 Body Mass Index (BMI) 40.0 and over, adult: Secondary | ICD-10-CM | POA: Diagnosis not present

## 2014-07-08 DIAGNOSIS — M4316 Spondylolisthesis, lumbar region: Secondary | ICD-10-CM | POA: Diagnosis not present

## 2014-07-08 DIAGNOSIS — M4726 Other spondylosis with radiculopathy, lumbar region: Secondary | ICD-10-CM | POA: Diagnosis not present

## 2014-07-14 ENCOUNTER — Other Ambulatory Visit: Payer: Self-pay | Admitting: *Deleted

## 2014-07-14 ENCOUNTER — Other Ambulatory Visit: Payer: Self-pay

## 2014-07-14 ENCOUNTER — Telehealth: Payer: Self-pay | Admitting: Cardiovascular Disease

## 2014-07-14 MED ORDER — FUROSEMIDE 80 MG PO TABS: 80.0000 mg | ORAL_TABLET | ORAL | Status: DC

## 2014-07-14 MED ORDER — CARVEDILOL 25 MG PO TABS: ORAL_TABLET | ORAL | Status: DC

## 2014-07-14 MED ORDER — HYDRALAZINE HCL 25 MG PO TABS: 25.0000 mg | ORAL_TABLET | Freq: Three times a day (TID) | ORAL | Status: DC

## 2014-07-14 MED ORDER — CLOPIDOGREL BISULFATE 75 MG PO TABS: ORAL_TABLET | ORAL | Status: DC

## 2014-07-14 MED ORDER — TRIAMTERENE-HCTZ 37.5-25 MG PO TABS: 1.0000 | ORAL_TABLET | Freq: Every day | ORAL | Status: DC

## 2014-07-14 MED ORDER — ATORVASTATIN CALCIUM 80 MG PO TABS: 80.0000 mg | ORAL_TABLET | Freq: Every day | ORAL | Status: DC

## 2014-07-14 NOTE — Telephone Encounter (Signed)
90 day Rx Request sent to [facility] mail service for Hydralazine, Plavix and Triamterene.

## 2014-07-14 NOTE — Telephone Encounter (Signed)
Pt request refill atorvastatin, carvedilol and furosemide to [facility]. Pt has 6 mth f/u 10/26/14. Advised pt done.

## 2014-07-14 NOTE — Telephone Encounter (Signed)
Pt called stating that he will run out of RX if we don't send them into [facility].  Please fax orders to [phone]  1. Which medications need to be refilled?  HydrALAZINE Clopidogrel Triamterene   2. Which pharmacy is medication to be sent to? [facility]  3. Do they need a 30 day or 90 day supply? 90 day for each   4. Would they like a call back once the medication has been sent to the pharmacy? yes

## 2014-07-15 DIAGNOSIS — M6281 Muscle weakness (generalized): Secondary | ICD-10-CM | POA: Diagnosis not present

## 2014-07-15 DIAGNOSIS — M4725 Other spondylosis with radiculopathy, thoracolumbar region: Secondary | ICD-10-CM | POA: Diagnosis not present

## 2014-07-15 DIAGNOSIS — M2560 Stiffness of unspecified joint, not elsewhere classified: Secondary | ICD-10-CM | POA: Diagnosis not present

## 2014-07-22 DIAGNOSIS — M6281 Muscle weakness (generalized): Secondary | ICD-10-CM | POA: Diagnosis not present

## 2014-07-22 DIAGNOSIS — M2560 Stiffness of unspecified joint, not elsewhere classified: Secondary | ICD-10-CM | POA: Diagnosis not present

## 2014-07-22 DIAGNOSIS — M4725 Other spondylosis with radiculopathy, thoracolumbar region: Secondary | ICD-10-CM | POA: Diagnosis not present

## 2014-07-28 ENCOUNTER — Telehealth: Payer: Self-pay | Admitting: Cardiovascular Disease

## 2014-07-28 NOTE — Telephone Encounter (Signed)
°  1. Which medications need to be refilled? Isosorbide Mononitrate   2. Which pharmacy is medication to be sent to? [facility]    3. Do they need a 30 day or 90 day supply? 90 day to [facility] and 30 day to [facility] until he gets the one from [facility]  4. Would they like a call back once the medication has been sent to the pharmacy? Yes

## 2014-07-29 DIAGNOSIS — M6281 Muscle weakness (generalized): Secondary | ICD-10-CM | POA: Diagnosis not present

## 2014-07-29 DIAGNOSIS — M4725 Other spondylosis with radiculopathy, thoracolumbar region: Secondary | ICD-10-CM | POA: Diagnosis not present

## 2014-07-29 DIAGNOSIS — M2560 Stiffness of unspecified joint, not elsewhere classified: Secondary | ICD-10-CM | POA: Diagnosis not present

## 2014-07-29 MED ORDER — ISOSORBIDE MONONITRATE ER 60 MG PO TB24: 60.0000 mg | ORAL_TABLET | Freq: Two times a day (BID) | ORAL | Status: DC

## 2014-07-29 NOTE — Telephone Encounter (Signed)
Refill sent for isosorbide.

## 2014-07-31 ENCOUNTER — Other Ambulatory Visit: Payer: Self-pay

## 2014-07-31 MED ORDER — ISOSORBIDE MONONITRATE ER 60 MG PO TB24: 60.0000 mg | ORAL_TABLET | Freq: Two times a day (BID) | ORAL | Status: DC

## 2014-08-01 ENCOUNTER — Telehealth: Payer: Self-pay | Admitting: Physician Assistant

## 2014-08-01 MED ORDER — ISOSORBIDE MONONITRATE ER 60 MG PO TB24: 60.0000 mg | ORAL_TABLET | Freq: Two times a day (BID) | ORAL | Status: DC

## 2014-08-01 NOTE — Telephone Encounter (Signed)
Patient says he's been trying to get this refilled for three weeks. Looks it was sent on 1/27.  I sent it again.  Tarri Fuller PAC

## 2014-08-05 DIAGNOSIS — M4725 Other spondylosis with radiculopathy, thoracolumbar region: Secondary | ICD-10-CM | POA: Diagnosis not present

## 2014-08-05 DIAGNOSIS — M2560 Stiffness of unspecified joint, not elsewhere classified: Secondary | ICD-10-CM | POA: Diagnosis not present

## 2014-08-05 DIAGNOSIS — M6281 Muscle weakness (generalized): Secondary | ICD-10-CM | POA: Diagnosis not present

## 2014-08-07 ENCOUNTER — Encounter: Payer: Self-pay | Admitting: Cardiovascular Disease

## 2014-08-07 ENCOUNTER — Ambulatory Visit (INDEPENDENT_AMBULATORY_CARE_PROVIDER_SITE_OTHER): Payer: Commercial Managed Care - HMO | Admitting: Cardiovascular Disease

## 2014-08-07 VITALS — BP 120/58 | HR 59 | Ht 65.0 in | Wt 263.0 lb

## 2014-08-07 DIAGNOSIS — E785 Hyperlipidemia, unspecified: Secondary | ICD-10-CM | POA: Diagnosis not present

## 2014-08-07 DIAGNOSIS — E1140 Type 2 diabetes mellitus with diabetic neuropathy, unspecified: Secondary | ICD-10-CM | POA: Diagnosis not present

## 2014-08-07 DIAGNOSIS — I25118 Atherosclerotic heart disease of native coronary artery with other forms of angina pectoris: Secondary | ICD-10-CM | POA: Diagnosis not present

## 2014-08-07 DIAGNOSIS — I10 Essential (primary) hypertension: Secondary | ICD-10-CM

## 2014-08-07 DIAGNOSIS — I5032 Chronic diastolic (congestive) heart failure: Secondary | ICD-10-CM

## 2014-08-07 MED ORDER — TRAZODONE HCL 50 MG PO TABS: 50.0000 mg | ORAL_TABLET | Freq: Every evening | ORAL | Status: DC | PRN

## 2014-08-07 NOTE — Assessment & Plan Note (Signed)
Weight is up several pounds, likely from overeating and inactivity. He does have edema. Recommended he stay on his Lasix.

## 2014-08-07 NOTE — Assessment & Plan Note (Signed)
Currently with no symptoms of angina. No further workup at this time. Continue current medication regimen. 

## 2014-08-07 NOTE — Assessment & Plan Note (Signed)
Blood pressure is well controlled on today's visit. No changes made to the medications. 

## 2014-08-07 NOTE — Assessment & Plan Note (Signed)
Strongly recommended weight loss for diabetes control.

## 2014-08-07 NOTE — Progress Notes (Signed)
Patient ID: Stephen [name], male    DOB: September 22, 1945, 69 y.o.   MRN: [id number]  HPI Comments: [name] is a very pleasant 69 year old gentleman with a history of obesity,coronary artery disease, PCI to the mid RCA and mid left circumflex in October 2007, interstitial fibrosis on chronic oxygen, with wedge resection, hypertension, hyperlipidemia, remote tobacco abuse, chronic substernal chest pain who presents for routine followup of his coronary artery disease  In follow-up he reports symptoms below, Fatigue, sleeping all the time. Take sleeping pill, Sleeps 4 to 5.5 hours No napping in the day, No excess urination Sleeps at church every Sunday Breathing stable on 2 liters  Denies having any recent chest pain symptoms concerning for angina Weight is up from 257 pounds now 263 pounds He reports weight went up when he was unable to exert himself when he had a pinched nerve in his back Total cholesterol 121, hemoglobin A1c 8.0, creatinine 2.0. Previously reported having bad diet, potatoes, bread  EKG on today's visit shows normal sinus rhythm with rate 59 bpm, old anterior MI, old inferior MI, ST and T wave abnormality anterolateral leads, no change from prior EKG  Other past medical history Stress test July 2011 was dobutamine stress that showed no significant ischemia, inadequate heart rate was achieved. right and left heart catheterization January 2011 showed 50% mid LAD disease, 60% at the ostium of the diagonal #2, 30% proximal left circumflex disease, patent stent in the left circumflex, patent stent of the mid RCA, wedge pressure of 16, PA pressure  mean 22, right ventricular pressure 41/8 .   echocardiogram September 2010 shows normal systolic function, diastolic dysfunction, normal RV function unable to evaluate right ventricular systolic pressures    Allergies  Allergen Reactions  . Calcium Channel Blockers     Would avoid if possible due to h/o peripheral edema    Outpatient Encounter Prescriptions as of 08/07/2014  Medication Sig  . aspirin 81 MG tablet Take 81 mg by mouth 2 (two) times daily.    Marland . atorvastatin (LIPITOR) 80 MG tablet Take 1 tablet (80 mg total) by mouth at bedtime.  . carvedilol (COREG) 25 MG tablet TAKE 1 TABLET TWICE DAILY WITH A MEAL  . cloNIDine (CATAPRES) 0.3 MG tablet Take 1 tablet (0.3 mg total) by mouth 3 (three) times daily.  . clopidogrel (PLAVIX) 75 MG tablet TAKE ONE TABLET BY MOUTH EVERY DAY  . finasteride (PROSCAR) 5 MG tablet Once daily  . furosemide (LASIX) 80 MG tablet Take 1 tablet (80 mg total) by mouth every other day.  . gabapentin (NEURONTIN) 800 MG tablet TAKE 1 TABLET IN THE MORNING  AND TAKE 1 AND 1/2 TABLETS AT BEDTIME  . glucose blood (ONE TOUCH TEST STRIPS) test strip Ck blood sugar three times daily and as needed. Dx. 250.62  . hydrALAZINE (APRESOLINE) 25 MG tablet Take 1 tablet (25 mg total) by mouth 3 (three) times daily.  . insulin NPH-regular Human (NOVOLIN 70/30) (70-30) 100 UNIT/ML injection INJECT  70 UNITS SUBCUTANEOUSLY EVERY MORNING  AND  35-45 UNITS IN THE EVENING  . Insulin Syringe-Needle U-100 (INSULIN SYRINGE 1CC/30GX5/16") 30G X 5/16" 1 ML MISC Use as directed to take insulin  Dx 250.62  . isosorbide mononitrate (IMDUR) 60 MG 24 hr tablet Take 1 tablet (60 mg total) by mouth 2 (two) times daily.  . nitroGLYCERIN (NITROSTAT) 0.4 MG SL tablet Place 1 tablet (0.4 mg total) under the tongue every 5 (five) minutes as needed. May repeat x3  . omeprazole (PRILOSEC) 20 MG capsule Take 20 mg by mouth daily.    Marland . oxyCODONE-acetaminophen (PERCOCET/ROXICET) 5-325 MG per tablet Take  1-2 tablets by mouth every 6 (six) hours as needed (for back pain. sedation caution).  . triamterene-hydrochlorothiazide (MAXZIDE-25) 37.5-25 MG per tablet Take 1 tablet by mouth daily.  . traZODone (DESYREL) 50 MG tablet Take 1 tablet (50 mg total) by mouth at bedtime as needed for sleep.  . [DISCONTINUED] cyclobenzaprine (FLEXERIL) 10 MG tablet Take 1 tablet (10 mg total) by mouth 2 (two) times daily as needed for muscle spasms (sedation caution). (Patient not taking: Reported on 08/07/2014)  . [DISCONTINUED] traMADol (ULTRAM) 50 MG tablet Take 1 tablet (50 mg total) by mouth every 12 (twelve) hours as needed for severe pain. (Patient not taking: Reported on 08/07/2014)    Past Medical History  Diagnosis Date  . 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 DM2 (diabetes mellitus, type 2)     insulin requiring  . Diabetes mellitus with neuropathy   . OSA (obstructive sleep apnea)   . HTN (hypertension)   . CHF (congestive heart failure)   . Kidney stones   . Dyslipidemia   . Metabolic syndrome   . Arthritis   . GERD (gastroesophageal reflux disease)   . Heart disease   . High cholesterol   . Kidney disease   . Colon polyps   . Thyroid disorder   . Recurrent UTI   . 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 Adrenal gland anomaly   . Skin cancer     head  . Rotator cuff disorder     right  . Renal artery stenosis     L sided, stent 07/2013    Past Surgical History  Procedure Laterality Date  . 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 Coronary angioplasty with stent placement  2007    x 2  . Knee surgery      right  . Lung biopsy    . Carpal tunnel release      bilateral  . Skin cancer excision      back of head  . Renal artery stent  2015    L    Social History reports that he has never smoked. He has never used smokeless tobacco. He reports that he does not drink alcohol or  use illicit drugs.  Family History family history includes Arthritis in his maternal grandmother; Breast cancer in his maternal aunt; COPD in his sister; Diabetes in his maternal aunt. History of  diabetes Open lung biopsy was performed November 2009 for bilateral pulmonary infiltrates. Status post stent to his left renal artery for 80+ percent stenosis several weeks ago, performed by Dr. Lucky Cowboy. Also had laser treatment of the lower extremity veins for leg swelling/venous insufficiency He presents for routine followup of his coronary artery disease  In follow-up he reports symptoms below, Fatigue, sleeping all the time. Take sleeping pill, Sleeps 4 to 5.5 hours No napping in the day, No excess urination Sleeps at church every Sunday Breathing stable on 2 liters  Denies having any recent chest pain symptoms concerning for angina Weight is up from 257 pounds now 263 pounds He reports weight went up when he was unable to exert himself when he had a pinched nerve in his back Total cholesterol 121, hemoglobin A1c 8.0, creatinine 2.0. Previously reported having bad diet, potatoes, bread  EKG on today's visit shows normal sinus rhythm with rate 59 bpm, old anterior MI, old inferior MI, ST and T wave abnormality anterolateral leads, no change from prior EKG  Other past medical history Stress test July 2011 was dobutamine stress that showed no significant ischemia, inadequate heart rate was achieved. right and left heart catheterization January 2011 showed 50% mid LAD disease, 60% at the ostium of the diagonal #2, 30% proximal left circumflex disease, patent stent in the left circumflex, patent stent of the mid RCA, wedge pressure of 16, PA pressure  mean 22, right ventricular pressure 41/8 .   echocardiogram September 2010 shows normal systolic function, diastolic dysfunction, normal RV function unable to evaluate right ventricular systolic pressures    Allergies  Allergen Reactions  . Calcium Channel Blockers     Would avoid if possible due to h/o peripheral edema    Outpatient Encounter Prescriptions as of 08/07/2014  Medication Sig  . aspirin 81 MG tablet Take 81 mg by mouth 2 (two) times daily.    Marland . atorvastatin (LIPITOR) 80 MG tablet Take 1 tablet (80 mg total) by mouth at bedtime.  . carvedilol (COREG) 25 MG tablet TAKE 1 TABLET TWICE DAILY WITH A MEAL  . cloNIDine (CATAPRES) 0.3 MG tablet Take 1 tablet (0.3 mg total) by mouth 3 (three) times daily.  . clopidogrel (PLAVIX) 75 MG tablet TAKE ONE TABLET BY MOUTH EVERY DAY  . finasteride (PROSCAR) 5 MG tablet Once daily  . furosemide (LASIX) 80 MG tablet Take 1 tablet (80 mg total) by mouth every other day.  . gabapentin (NEURONTIN) 800 MG tablet TAKE 1 TABLET IN THE MORNING  AND TAKE 1 AND 1/2 TABLETS AT BEDTIME  . glucose blood (ONE TOUCH TEST STRIPS) test strip Ck blood sugar three times daily and as needed. Dx. 250.62  . hydrALAZINE (APRESOLINE) 25 MG tablet Take 1 tablet (25 mg total) by mouth 3 (three) times daily.  . insulin NPH-regular Human (NOVOLIN 70/30) (70-30) 100 UNIT/ML injection INJECT  70 UNITS SUBCUTANEOUSLY EVERY MORNING  AND  35-45 UNITS IN THE EVENING  . Insulin Syringe-Needle U-100 (INSULIN SYRINGE 1CC/30GX5/16") 30G X 5/16" 1 ML MISC Use as directed to take insulin  Dx 250.62  . isosorbide mononitrate (IMDUR) 60 MG 24 hr tablet Take 1 tablet (60 mg total) by mouth 2 (two) times daily.  . nitroGLYCERIN (NITROSTAT) 0.4 MG SL tablet Place 1 tablet (0.4 mg total) under the tongue every 5 (five) minutes as needed. May repeat x3  . omeprazole (PRILOSEC) 20 MG capsule Take 20 mg by mouth daily.    Marland . oxyCODONE-acetaminophen (PERCOCET/ROXICET) 5-325 MG per tablet Take  1-2 tablets by mouth every 6 (six) hours as needed (for back pain. sedation caution).  . triamterene-hydrochlorothiazide (MAXZIDE-25) 37.5-25 MG per tablet Take 1 tablet by mouth daily.  . traZODone (DESYREL) 50 MG tablet Take 1 tablet (50 mg total) by mouth at bedtime as needed for sleep.  . [DISCONTINUED] cyclobenzaprine (FLEXERIL) 10 MG tablet Take 1 tablet (10 mg total) by mouth 2 (two) times daily as needed for muscle spasms (sedation caution). (Patient not taking: Reported on 08/07/2014)  . [DISCONTINUED] traMADol (ULTRAM) 50 MG tablet Take 1 tablet (50 mg total) by mouth every 12 (twelve) hours as needed for severe pain. (Patient not taking: Reported on 08/07/2014)    Past Medical History  Diagnosis Date  . 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 DM2 (diabetes mellitus, type 2)     insulin requiring  . Diabetes mellitus with neuropathy   . OSA (obstructive sleep apnea)   . HTN (hypertension)   . CHF (congestive heart failure)   . Kidney stones   . Dyslipidemia   . Metabolic syndrome   . Arthritis   . GERD (gastroesophageal reflux disease)   . Heart disease   . High cholesterol   . Kidney disease   . Colon polyps   . Thyroid disorder   . Recurrent UTI   . 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 Adrenal gland anomaly   . Skin cancer     head  . Rotator cuff disorder     right  . Renal artery stenosis     L sided, stent 07/2013    Past Surgical History  Procedure Laterality Date  . 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 Coronary angioplasty with stent placement  2007    x 2  . Knee surgery      right  . Lung biopsy    . Carpal tunnel release      bilateral  . Skin cancer excision      back of head  . Renal artery stent  2015    L    Social History reports that he has never smoked. He has never used smokeless tobacco. He reports that he does not drink alcohol or  use illicit drugs.  Family History family history includes Arthritis in his maternal grandmother; Breast cancer in his maternal aunt; COPD in his sister; Diabetes in his maternal aunt of his coronary artery disease  In follow-up he reports symptoms below, Fatigue, sleeping all the time. Take sleeping pill, Sleeps 4 to 5.5 hours No napping in the day, No excess urination Sleeps at church every Sunday Breathing stable on 2 liters  Denies having any recent chest pain symptoms concerning for angina Weight is up from 257 pounds now 263 pounds He reports weight went up when he was unable to exert himself when he had a pinched nerve in his back Total cholesterol 121, hemoglobin A1c 8.0, creatinine 2.0. Previously reported having bad diet, potatoes, bread  EKG on today's visit shows normal sinus rhythm with rate 59 bpm, old anterior MI, old inferior MI, ST and T wave abnormality anterolateral leads, no change from prior EKG  Other past medical history Stress test July 2011 was dobutamine stress that showed no significant ischemia, inadequate heart rate was achieved. right and left heart catheterization January 2011 showed 50% mid LAD disease, 60% at the ostium of the diagonal #2, 30% proximal left circumflex disease, patent stent in the left circumflex, patent stent of the mid RCA, wedge pressure of 16, PA pressure  mean 22, right ventricular pressure 41/8 .   echocardiogram September 2010 shows normal systolic function, diastolic dysfunction, normal RV function unable to evaluate right ventricular systolic pressures    Allergies  Allergen Reactions  . Calcium Channel Blockers     Would avoid if possible due to h/o peripheral edema    Outpatient Encounter Prescriptions as of 08/07/2014  Medication Sig  . aspirin 81 MG tablet Take 81 mg by mouth 2 (two) times daily.    Marland Kitchen atorvastatin (LIPITOR) 80 MG tablet Take 1 tablet (80 mg total) by mouth at bedtime.  . carvedilol (COREG) 25 MG tablet TAKE 1 TABLET TWICE DAILY WITH A MEAL  . cloNIDine (CATAPRES) 0.3 MG tablet Take 1 tablet (0.3 mg total) by mouth 3 (three) times daily.  . clopidogrel (PLAVIX) 75 MG tablet TAKE ONE TABLET BY MOUTH EVERY DAY  . finasteride (PROSCAR) 5 MG tablet Once daily  . furosemide (LASIX) 80 MG tablet Take 1 tablet (80 mg total) by mouth every other day.  . gabapentin (NEURONTIN) 800 MG tablet TAKE 1 TABLET IN THE MORNING  AND TAKE 1 AND 1/2 TABLETS AT BEDTIME  . glucose blood (ONE TOUCH TEST STRIPS) test strip Ck blood sugar three times daily and as needed. Dx. 250.62  . hydrALAZINE (APRESOLINE) 25 MG tablet Take 1 tablet (25 mg total) by mouth 3 (three) times daily.  . insulin NPH-regular Human (NOVOLIN 70/30) (70-30) 100 UNIT/ML injection INJECT  70 UNITS SUBCUTANEOUSLY EVERY MORNING  AND  35-45 UNITS IN THE EVENING  . Insulin Syringe-Needle U-100 (INSULIN SYRINGE 1CC/30GX5/16") 30G X 5/16" 1 ML MISC Use as directed to take insulin  Dx 250.62  . isosorbide mononitrate (IMDUR) 60 MG 24 hr tablet Take 1 tablet (60 mg total) by mouth 2 (two) times daily.  . nitroGLYCERIN (NITROSTAT) 0.4 MG SL tablet Place 1 tablet (0.4 mg total) under the tongue every 5 (five) minutes as needed. May repeat x3  . omeprazole (PRILOSEC) 20 MG capsule Take 20 mg by mouth daily.    Marland Kitchen oxyCODONE-acetaminophen (PERCOCET/ROXICET) 5-325 MG per tablet Take  1-2 tablets by mouth every 6 (six) hours as needed (for back pain. sedation caution).  . triamterene-hydrochlorothiazide (MAXZIDE-25) 37.5-25 MG per tablet Take 1 tablet by mouth daily.  . traZODone (DESYREL) 50 MG tablet Take 1 tablet (50 mg total) by mouth at bedtime as needed for sleep.  . [DISCONTINUED] cyclobenzaprine (FLEXERIL) 10 MG tablet Take 1 tablet (10 mg total) by mouth 2 (two) times daily as needed for muscle spasms (sedation caution). (Patient not taking: Reported on 08/07/2014)  . [DISCONTINUED] traMADol (ULTRAM) 50 MG tablet Take 1 tablet (50 mg total) by mouth every 12 (twelve) hours as needed for severe pain. (Patient not taking: Reported on 08/07/2014)    Past Medical History  Diagnosis Date  . 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 DM2 (diabetes mellitus, type 2)     insulin requiring  . Diabetes mellitus with neuropathy   . OSA (obstructive sleep apnea)   . HTN (hypertension)   . CHF (congestive heart failure)   . Kidney stones   . Dyslipidemia   . Metabolic syndrome   . Arthritis   . GERD (gastroesophageal reflux disease)   . Heart disease   . High cholesterol   . Kidney disease   . Colon polyps   . Thyroid disorder   . Recurrent UTI   . 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 Adrenal gland anomaly   . Skin cancer     head  . Rotator cuff disorder     right  . Renal artery stenosis     L sided, stent 07/2013    Past Surgical History  Procedure Laterality Date  . 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 Coronary angioplasty with stent placement  2007    x 2  . Knee surgery      right  . Lung biopsy    . Carpal tunnel release      bilateral  . Skin cancer excision      back of head  . Renal artery stent  2015    L    Social History  reports that he has never smoked. He has never used smokeless tobacco. He reports that he does not drink alcohol or  use illicit drugs.  Family History family history includes Arthritis in his maternal grandmother; Breast cancer in his maternal aunt; COPD in his sister; Diabetes in his maternal aunt; Heart attack in his brother, father, and mother; Hypertension in his maternal aunt and other.  Review of Systems  Constitutional: Negative.   Respiratory: Positive for shortness of breath.   Cardiovascular: Positive for leg swelling.  Gastrointestinal: Negative.   Musculoskeletal: Negative.   Skin: Negative.   Neurological: Negative.   Hematological: Negative.   Psychiatric/Behavioral: Negative.   All other systems reviewed and are negative.  BP 120/58 mmHg  Pulse 59  Ht 5\' 5"  (1.651 m)  Wt 263 lb (119.296 kg)  BMI 43.77 kg/m2  Physical Exam  Constitutional: He is oriented to person, place, and time. He appears well-developed and well-nourished.  Morbidly obese.  HENT:  Head: Normocephalic.  Nose: Nose normal.  Mouth/Throat: Oropharynx is clear and moist.  Eyes: Conjunctivae are normal. Pupils are equal, round, and reactive to light.  Neck: Normal range of motion. Neck supple. No JVD present.  Cardiovascular: Normal rate, regular rhythm, S1 normal, S2 normal, normal heart sounds and intact distal pulses.  Exam reveals no gallop and no friction rub.   No murmur heard. Trace  edema of the lower extremities to the mid shin  Pulmonary/Chest: Effort normal and breath sounds normal. No respiratory distress. He has no wheezes. He has no rales. He exhibits no tenderness.  Abdominal: Soft. Bowel sounds are normal. He exhibits no distension. There is no tenderness.  Musculoskeletal: Normal range of motion. He exhibits edema. He exhibits no tenderness.  Lymphadenopathy:    He has no cervical adenopathy.  Neurological: He is alert and oriented to person, place, and time. Coordination normal.  Skin: Skin is warm and dry. No rash noted. No erythema.  Psychiatric: He has a normal mood and affect. His  behavior is normal. Judgment and thought content normal.      Assessment and Plan   Nursing note and vitals reviewed.

## 2014-08-07 NOTE — Patient Instructions (Signed)
You are doing well.  For sleep, try trazodone at night You can repeat x 1 pill if needed  Please call us if you have new issues that need to be addressed before your next appt.  Your physician wants you to follow-up in: 6 months.  You will receive a reminder letter in the mail two months in advance. If you don't receive a letter, please call our office to schedule the follow-up appointment.

## 2014-08-07 NOTE — Assessment & Plan Note (Signed)
We have encouraged continued exercise, careful diet management in an effort to lose weight. Recommended close follow-up with primary care. Suspect significant dietary indiscretion

## 2014-08-07 NOTE — Assessment & Plan Note (Signed)
Cholesterol is at goal on the current lipid regimen. No changes to the medications were made.  

## 2014-09-14 DIAGNOSIS — I10 Essential (primary) hypertension: Secondary | ICD-10-CM | POA: Diagnosis not present

## 2014-09-14 DIAGNOSIS — M4726 Other spondylosis with radiculopathy, lumbar region: Secondary | ICD-10-CM | POA: Diagnosis not present

## 2014-09-14 DIAGNOSIS — Z6841 Body Mass Index (BMI) 40.0 and over, adult: Secondary | ICD-10-CM | POA: Diagnosis not present

## 2014-09-14 DIAGNOSIS — M4316 Spondylolisthesis, lumbar region: Secondary | ICD-10-CM | POA: Diagnosis not present

## 2014-09-25 ENCOUNTER — Other Ambulatory Visit: Payer: Self-pay | Admitting: *Deleted

## 2014-09-25 ENCOUNTER — Telehealth: Payer: Self-pay | Admitting: *Deleted

## 2014-09-25 MED ORDER — CLONIDINE HCL 0.3 MG PO TABS: 0.3000 mg | ORAL_TABLET | Freq: Three times a day (TID) | ORAL | Status: DC

## 2014-09-25 NOTE — Telephone Encounter (Signed)
°  1. Which medications need to be refilled? CloNIDine  2. Which pharmacy is medication to be sent to? [facility]   3. Do they need a 30 day or 90 day supply? 90 day   4. Would they like a call back once the medication has been sent to the pharmacy? no

## 2014-09-30 ENCOUNTER — Other Ambulatory Visit: Payer: Self-pay

## 2014-09-30 MED ORDER — INSULIN NPH ISOPHANE & REGULAR (70-30) 100 UNIT/ML ~~LOC~~ SUSP: SUBCUTANEOUS | Status: DC

## 2014-09-30 NOTE — Telephone Encounter (Signed)
Okay to continue, sent.  Thanks.  

## 2014-09-30 NOTE — Telephone Encounter (Signed)
Pt request refill novolin 70/30 quantity #10 vials to [facility] order pharmacy. pts last f/u visit 04/23/2014 has novolin 70/30 listed as discontinued med.Please advise. Pt states he has been taking novolin 70/30 insulin.Please advise.

## 2014-10-07 DIAGNOSIS — J449 Chronic obstructive pulmonary disease, unspecified: Secondary | ICD-10-CM | POA: Diagnosis not present

## 2014-10-08 DIAGNOSIS — E1122 Type 2 diabetes mellitus with diabetic chronic kidney disease: Secondary | ICD-10-CM | POA: Diagnosis not present

## 2014-10-08 DIAGNOSIS — I10 Essential (primary) hypertension: Secondary | ICD-10-CM | POA: Diagnosis not present

## 2014-10-08 DIAGNOSIS — R600 Localized edema: Secondary | ICD-10-CM | POA: Diagnosis not present

## 2014-10-08 DIAGNOSIS — I701 Atherosclerosis of renal artery: Secondary | ICD-10-CM | POA: Diagnosis not present

## 2014-10-08 DIAGNOSIS — N183 Chronic kidney disease, stage 3 (moderate): Secondary | ICD-10-CM | POA: Diagnosis not present

## 2014-10-19 ENCOUNTER — Other Ambulatory Visit: Payer: Self-pay | Admitting: *Deleted

## 2014-10-19 ENCOUNTER — Telehealth: Payer: Self-pay | Admitting: *Deleted

## 2014-10-19 MED ORDER — CLONIDINE HCL 0.3 MG PO TABS: 0.3000 mg | ORAL_TABLET | Freq: Three times a day (TID) | ORAL | Status: DC

## 2014-10-19 NOTE — Telephone Encounter (Signed)
°  1. Which medications need to be refilled? Clondine   2. Which pharmacy is medication to be sent to? [facility]  3. Do they need a 30 day or 90 day supply? 90  4. Would they like a call back once the medication has been sent to the pharmacy? No

## 2014-10-20 ENCOUNTER — Other Ambulatory Visit: Payer: Self-pay | Admitting: Family Medicine

## 2014-10-20 DIAGNOSIS — E1140 Type 2 diabetes mellitus with diabetic neuropathy, unspecified: Secondary | ICD-10-CM

## 2014-10-20 MED ORDER — VALSARTAN-HYDROCHLOROTHIAZIDE 320-25 MG PO TABS
0.5000 | ORAL_TABLET | Freq: Every day | ORAL | Status: DC
Start: 2014-10-20 — End: 2015-08-27

## 2014-10-21 ENCOUNTER — Other Ambulatory Visit (INDEPENDENT_AMBULATORY_CARE_PROVIDER_SITE_OTHER): Payer: Commercial Managed Care - HMO

## 2014-10-21 DIAGNOSIS — E1140 Type 2 diabetes mellitus with diabetic neuropathy, unspecified: Secondary | ICD-10-CM

## 2014-10-21 LAB — LIPID PANEL
Cholesterol: 119 mg/dL (ref 0–200)
HDL: 23.9 mg/dL — ABNORMAL LOW (ref >39.00)
LDL Cholesterol: 71 mg/dL (ref 0–99)
NonHDL: 95.1
Total CHOL/HDL Ratio: 5
Triglycerides: 121 mg/dL (ref 0.0–149.0)
VLDL: 24.2 mg/dL (ref 0.0–40.0)

## 2014-10-21 LAB — HEMOGLOBIN A1C: Hgb A1c MFr Bld: 8.7 % — ABNORMAL HIGH (ref 4.6–6.5)

## 2014-10-24 DIAGNOSIS — J982 Interstitial emphysema: Secondary | ICD-10-CM | POA: Diagnosis not present

## 2014-10-24 NOTE — Op Note (Signed)
PATIENT NAME:  Stephen [name], Stephen [name] MR#:  [id number] DATE OF BIRTH:  Feb 19, 1946  DATE OF PROCEDURE:  07/21/2013  PREOPERATIVE DIAGNOSES: 1.  Left renal artery stenosis.  2.  Severe poorly-controlled hypertension on multiple medications.  3.  Obesity.  4.  Diabetes.  5.  Chronic kidney disease stage III.  POSTOPERATIVE DIAGNOSES: 1.  Left renal artery stenosis.  2.  Severe poorly-controlled hypertension on multiple medications.  3.  Obesity.  4.  Diabetes.  5.  Chronic kidney disease stage III.  PROCEDURES PERFORMED: 1.  Ultrasound guidance for vascular access, right femoral artery.  2.  Catheter placement to left renal artery from right femoral approach.  3.  Aortogram and selective left renal angiogram.  4.  Placement of a left renal artery stent using a 7 mm diameter x 18 mm length stent with the Medtronic PercuSurge embolic protection device.  5.  StarClose closure device, right femoral artery.   SURGEON: Algernon Huxley, M.D.   ANESTHESIA: Local with moderate conscious sedation.   ESTIMATED BLOOD LOSS: 25 mL.   FLUOROSCOPY TIME: Approximately 4 minutes.   CONTRAST USED: 35 mL.    INDICATION FOR PROCEDURE: A 69 year old white male was referred to our office for evaluation of renal artery stenosis. He has chronic kidney disease. He has severe poorly-controlled hypertension on 4 medications and a noninvasive study showed left renal artery stenosis. For these reasons, he is a good candidate for renal artery intervention. The risks and benefits were discussed. Informed consent was obtained.   DESCRIPTION OF PROCEDURE: The patient is brought to the vascular and interventional radiology suite. Groins were shaved, prepped and a sterile surgical field was created. Due to body habitus, ultrasound was used to visualize a patent right femoral artery. This was done under direct ultrasound guidance without difficulty with a Seldinger needle. A J-wire and 5-French sheath were then placed over a  stiff wire. Pigtail catheter was placed into the aorta at the L1 level. An AP aortogram was performed. This showed single renal arteries bilaterally with about a 30% to 40% stenosis at the right renal artery and about an 80% stenosis in the left renal artery. Both vessels were quite large. Celiac and SMA were also patent. The aorta down to the bifurcation was widely patent as were the proximal iliac vessels. I then heparinized the patient with 5000 units of intravenous heparin. A 6-French Ansel sheath was placed, and an IM guide catheter was used to selectively cannulate the left renal artery. Selective imaging was performed, which confirmed the high-grade stenosis in the left renal artery. The Medtronic PercuSurge balloon was then used to cross the lesion and inflate in the distal main renal artery. Due to the large size of the artery, I could not attain complete occlusion. I selected a 7 mm diameter x 18 mm length balloon expandable stent, deployed this across the lesion. A tight waist was taken which resolved, and we went to 12 atmospheres. The balloon was then deflated. The aspiration catheter was used to aspirate 2 syringes of blood and then was flushed with heparinized saline. The completion angiogram was then performed, which showed the stent in excellent location just into the aorta and encompassing the entirety of the lesion, which was now widely patent with less than 20% residual stenosis. At this point, I elected to terminate the procedure. There was a good nephrogram as well. The guide catheter was removed. Oblique arteriogram was performed of the right femoral artery, and StarClose closure device was deployed in  the usual fashion with excellent hemostatic result. The patient tolerated the procedure well and was taken to the recovery room in stable condition.     ____________________________ Algernon Huxley, MD jsd:[id number] D: 07/21/2013 12:21:39 ET T: 07/21/2013 19:55:29 ET JOB#: [id number]  cc: Algernon Huxley, MD, <Dictator> Elveria Rising. [name], MD Algernon Huxley MD ELECTRONICALLY SIGNED 07/24/2013 16:11

## 2014-10-26 ENCOUNTER — Ambulatory Visit (INDEPENDENT_AMBULATORY_CARE_PROVIDER_SITE_OTHER): Payer: Commercial Managed Care - HMO | Admitting: Family Medicine

## 2014-10-26 ENCOUNTER — Encounter: Payer: Self-pay | Admitting: Family Medicine

## 2014-10-26 VITALS — BP 128/60 | HR 87 | Temp 97.8°F | Wt 261.5 lb

## 2014-10-26 DIAGNOSIS — E785 Hyperlipidemia, unspecified: Secondary | ICD-10-CM

## 2014-10-26 DIAGNOSIS — Z23 Encounter for immunization: Secondary | ICD-10-CM

## 2014-10-26 DIAGNOSIS — M5441 Lumbago with sciatica, right side: Secondary | ICD-10-CM | POA: Diagnosis not present

## 2014-10-26 DIAGNOSIS — L723 Sebaceous cyst: Secondary | ICD-10-CM | POA: Diagnosis not present

## 2014-10-26 DIAGNOSIS — E1140 Type 2 diabetes mellitus with diabetic neuropathy, unspecified: Secondary | ICD-10-CM | POA: Diagnosis not present

## 2014-10-26 MED ORDER — OXYCODONE-ACETAMINOPHEN 5-325 MG PO TABS: 1.0000 | ORAL_TABLET | Freq: Four times a day (QID) | ORAL | Status: DC | PRN

## 2014-10-26 NOTE — Assessment & Plan Note (Signed)
Small seb cyst on back- not needing drainage.  D/w pt- if needing treatment and failing warm compresses, then he'll notify me.

## 2014-10-26 NOTE — Assessment & Plan Note (Signed)
Continue prn oxycodone, he wasn't a surgical candidate per his report.  Opiate cautions given, is on bowel regimen with prune juice.  He is getting by as is and isn't enthused about inc in dose or other meds.  No ADE on meds.

## 2014-10-26 NOTE — Progress Notes (Signed)
Pre visit review using our clinic review tool, if applicable. No additional management support is needed unless otherwise documented below in the visit note.  Still with flares of sciatica, R leg pain, taking oxycodone rarely with some relief at night.  More pain with walking.  Heat helps some.  Gabapentin helps the foot pain but not the sciatica.  Taking prune juice to help with constipation, with effect.    DM2.  A1c up, d/w pt.  Recently with mother in law at rehab for a broken hip.  More family concerns, caring for his father in law.  Diet has been affected.  Sugar ~70 in AM, usually with some higher readings around lunch.  He has sx with low sugars, rare events, down into the 50s.  He is still working on diet as much as he can.  Exercise has been limited.    Elevated Cholesterol: Using medications without problems:yes Muscle aches: not from statin.   Diet compliance: yes, see above.  Exercise: limited, see above.    Meds, vitals, and allergies reviewed.   ROS: See HPI.  Otherwise, noncontributory.   nad Uncomfortable (5/10 pain per patient), but stoic as his baseline Mmm On O2.  Small seb cyst on back- not needing drainage.  D/w pt- if needing treatment and failing warm compresses, then he'll notify me.  rrr Ctab, global dec in BS abd soft Ext with trace edema

## 2014-10-26 NOTE — Patient Instructions (Addendum)
When you can, try to get an eye appointment.   Use warm compresses on your back and if the spot doesn't get better (or if it gets red and sore) then I'll drain it.   Take care.  Recheck in about 3-4 months, labs ahead of time.  Glad to see you.

## 2014-10-26 NOTE — Assessment & Plan Note (Signed)
Controlled LDL, d/w pt.  Continue statin.  He agrees.

## 2014-10-26 NOTE — Assessment & Plan Note (Signed)
With sig social upheaval likely contributing to his A1c.  dw pt.  He'll work on diet in the upcoming period of time and update me as needed.  Recheck in about 3-4 months, sooner if needed.  We didn't add other meds at this point.  He agrees.

## 2014-11-06 DIAGNOSIS — J449 Chronic obstructive pulmonary disease, unspecified: Secondary | ICD-10-CM | POA: Diagnosis not present

## 2014-11-23 DIAGNOSIS — J982 Interstitial emphysema: Secondary | ICD-10-CM | POA: Diagnosis not present

## 2014-12-07 DIAGNOSIS — J449 Chronic obstructive pulmonary disease, unspecified: Secondary | ICD-10-CM | POA: Diagnosis not present

## 2014-12-24 DIAGNOSIS — J982 Interstitial emphysema: Secondary | ICD-10-CM | POA: Diagnosis not present

## 2014-12-30 ENCOUNTER — Other Ambulatory Visit: Payer: Self-pay

## 2014-12-30 MED ORDER — CARVEDILOL 25 MG PO TABS: ORAL_TABLET | ORAL | Status: DC

## 2014-12-30 MED ORDER — ATORVASTATIN CALCIUM 80 MG PO TABS: 80.0000 mg | ORAL_TABLET | Freq: Every day | ORAL | Status: DC

## 2014-12-30 NOTE — Telephone Encounter (Signed)
Pt left v/m requesting refill atorvastatin and carvedilol to [facility]. Refills done per protocol and pt notified done.

## 2015-01-06 DIAGNOSIS — J449 Chronic obstructive pulmonary disease, unspecified: Secondary | ICD-10-CM | POA: Diagnosis not present

## 2015-01-08 ENCOUNTER — Telehealth: Payer: Self-pay | Admitting: *Deleted

## 2015-01-08 NOTE — Telephone Encounter (Signed)
Form from [facility] faxed concerning a written Rx for Portable Oxygen Concentrator.  Form placed in Dr. Josefine Class In [place].

## 2015-01-10 NOTE — Telephone Encounter (Signed)
Form done. Thanks. 

## 2015-01-11 NOTE — Telephone Encounter (Signed)
Form faxed

## 2015-01-23 DIAGNOSIS — J982 Interstitial emphysema: Secondary | ICD-10-CM | POA: Diagnosis not present

## 2015-01-28 DIAGNOSIS — R600 Localized edema: Secondary | ICD-10-CM | POA: Diagnosis not present

## 2015-01-28 DIAGNOSIS — I701 Atherosclerosis of renal artery: Secondary | ICD-10-CM | POA: Diagnosis not present

## 2015-01-28 DIAGNOSIS — E1122 Type 2 diabetes mellitus with diabetic chronic kidney disease: Secondary | ICD-10-CM | POA: Diagnosis not present

## 2015-01-28 DIAGNOSIS — E559 Vitamin D deficiency, unspecified: Secondary | ICD-10-CM | POA: Diagnosis not present

## 2015-01-28 DIAGNOSIS — N183 Chronic kidney disease, stage 3 (moderate): Secondary | ICD-10-CM | POA: Diagnosis not present

## 2015-02-06 DIAGNOSIS — J449 Chronic obstructive pulmonary disease, unspecified: Secondary | ICD-10-CM | POA: Diagnosis not present

## 2015-02-08 ENCOUNTER — Telehealth: Payer: Self-pay | Admitting: Family Medicine

## 2015-02-08 NOTE — Telephone Encounter (Signed)
Pt dropped off disability placard form to be completed. Form in Lugene's box.

## 2015-02-08 NOTE — Telephone Encounter (Signed)
I'll work on the hard copy when I'm back in the office.  Thanks.

## 2015-02-18 ENCOUNTER — Other Ambulatory Visit: Payer: Self-pay | Admitting: Family Medicine

## 2015-02-18 ENCOUNTER — Other Ambulatory Visit (INDEPENDENT_AMBULATORY_CARE_PROVIDER_SITE_OTHER): Payer: Commercial Managed Care - HMO

## 2015-02-18 DIAGNOSIS — E1140 Type 2 diabetes mellitus with diabetic neuropathy, unspecified: Secondary | ICD-10-CM | POA: Diagnosis not present

## 2015-02-18 LAB — HEMOGLOBIN A1C: Hgb A1c MFr Bld: 7.6 % — ABNORMAL HIGH (ref 4.6–6.5)

## 2015-02-23 DIAGNOSIS — J982 Interstitial emphysema: Secondary | ICD-10-CM | POA: Diagnosis not present

## 2015-02-25 ENCOUNTER — Encounter: Payer: Self-pay | Admitting: Family Medicine

## 2015-02-25 ENCOUNTER — Ambulatory Visit (INDEPENDENT_AMBULATORY_CARE_PROVIDER_SITE_OTHER): Payer: Commercial Managed Care - HMO | Admitting: Family Medicine

## 2015-02-25 VITALS — BP 130/70 | HR 56 | Temp 98.6°F | Wt 265.2 lb

## 2015-02-25 DIAGNOSIS — H6122 Impacted cerumen, left ear: Secondary | ICD-10-CM | POA: Diagnosis not present

## 2015-02-25 DIAGNOSIS — N183 Chronic kidney disease, stage 3 unspecified: Secondary | ICD-10-CM

## 2015-02-25 DIAGNOSIS — E1140 Type 2 diabetes mellitus with diabetic neuropathy, unspecified: Secondary | ICD-10-CM

## 2015-02-25 DIAGNOSIS — E119 Type 2 diabetes mellitus without complications: Secondary | ICD-10-CM | POA: Diagnosis not present

## 2015-02-25 DIAGNOSIS — M5442 Lumbago with sciatica, left side: Secondary | ICD-10-CM | POA: Diagnosis not present

## 2015-02-25 DIAGNOSIS — Z119 Encounter for screening for infectious and parasitic diseases, unspecified: Secondary | ICD-10-CM | POA: Diagnosis not present

## 2015-02-25 MED ORDER — OXYCODONE-ACETAMINOPHEN 5-325 MG PO TABS: 1.0000 | ORAL_TABLET | Freq: Four times a day (QID) | ORAL | Status: DC | PRN

## 2015-02-25 NOTE — Patient Instructions (Signed)
Recheck in about 6 months, labs ahead of time.  Recheck in 3 months is sugar is up.  I would get a flu shot each fall.   Take care. Glad to see you.

## 2015-02-25 NOTE — Progress Notes (Signed)
Pre visit review using our clinic review tool, if applicable. No additional management support is needed unless otherwise documented below in the visit note.  Diabetes:  Using medications without difficulties: yes Hypoglycemic episodes:no Hyperglycemic episodes:no Feet problems:no Blood Sugars averaging: 70-150 usually eye exam within last year: due, d/w pt.   Back pain continues.  Needed refill on oxycodone.  Prn use.  Never pain free, used for flares of severe pain.  No ADE on med.  No new back sx.    Mother in law with hip fx, father in law with dementia.  Moved in with them to care for them. This has been difficult.  He is getting by.    L ear sx.  Dec in hearing, possible wax impaction.  No pain, no fevers.  No R ear sx.   D/w pt about HCV screening.  He opts in for next set of labs.    PMH and SH reviewed  Meds, vitals, and allergies reviewed.   ROS: See HPI.  Otherwise negative.    GEN: nad, alert and oriented, obese, on O2.  HEENT: mucous membranes moist, R ear wnl, L ear with wax impaction, removed with irrigation and curette, resolved, no complications, hearing improved.  NECK: supple w/o LA CV: rrr. PULM: ctab, no inc wob ABD: soft, +bs EXT: R>L leg edema at baseline, 1-2 BLE SKIN: no acute rash

## 2015-02-26 DIAGNOSIS — H6120 Impacted cerumen, unspecified ear: Secondary | ICD-10-CM | POA: Insufficient documentation

## 2015-02-26 NOTE — Assessment & Plan Note (Signed)
Resolved with irrigation and curette, no complications.

## 2015-02-26 NOTE — Assessment & Plan Note (Signed)
Improved, continue work on diet.  No change in meds. He agrees. He has been working on diet in the meantime. >25 minutes spent in face to face time with patient, >50% spent in counselling or coordination of care.

## 2015-02-26 NOTE — Assessment & Plan Note (Signed)
Continue prn oxycodone. No ADE on med.

## 2015-02-26 NOTE — Assessment & Plan Note (Signed)
D/w pt about diet and DM2 control to keep CKD from getting worse.  He understood.

## 2015-03-02 ENCOUNTER — Ambulatory Visit (INDEPENDENT_AMBULATORY_CARE_PROVIDER_SITE_OTHER): Payer: Commercial Managed Care - HMO

## 2015-03-02 DIAGNOSIS — Z23 Encounter for immunization: Secondary | ICD-10-CM | POA: Diagnosis not present

## 2015-03-09 DIAGNOSIS — J449 Chronic obstructive pulmonary disease, unspecified: Secondary | ICD-10-CM | POA: Diagnosis not present

## 2015-03-26 DIAGNOSIS — J982 Interstitial emphysema: Secondary | ICD-10-CM | POA: Diagnosis not present

## 2015-04-02 DIAGNOSIS — I701 Atherosclerosis of renal artery: Secondary | ICD-10-CM | POA: Diagnosis not present

## 2015-04-02 DIAGNOSIS — N189 Chronic kidney disease, unspecified: Secondary | ICD-10-CM | POA: Diagnosis not present

## 2015-04-02 DIAGNOSIS — I150 Renovascular hypertension: Secondary | ICD-10-CM | POA: Diagnosis not present

## 2015-04-08 DIAGNOSIS — J449 Chronic obstructive pulmonary disease, unspecified: Secondary | ICD-10-CM | POA: Diagnosis not present

## 2015-04-25 DIAGNOSIS — J982 Interstitial emphysema: Secondary | ICD-10-CM | POA: Diagnosis not present

## 2015-05-09 DIAGNOSIS — J449 Chronic obstructive pulmonary disease, unspecified: Secondary | ICD-10-CM | POA: Diagnosis not present

## 2015-05-26 DIAGNOSIS — I701 Atherosclerosis of renal artery: Secondary | ICD-10-CM | POA: Diagnosis not present

## 2015-05-26 DIAGNOSIS — R600 Localized edema: Secondary | ICD-10-CM | POA: Diagnosis not present

## 2015-05-26 DIAGNOSIS — R809 Proteinuria, unspecified: Secondary | ICD-10-CM | POA: Diagnosis not present

## 2015-05-26 DIAGNOSIS — I10 Essential (primary) hypertension: Secondary | ICD-10-CM | POA: Diagnosis not present

## 2015-05-26 DIAGNOSIS — J982 Interstitial emphysema: Secondary | ICD-10-CM | POA: Diagnosis not present

## 2015-05-26 DIAGNOSIS — N183 Chronic kidney disease, stage 3 (moderate): Secondary | ICD-10-CM | POA: Diagnosis not present

## 2015-06-02 ENCOUNTER — Ambulatory Visit: Payer: Commercial Managed Care - HMO | Admitting: Physician Assistant

## 2015-06-08 DIAGNOSIS — J449 Chronic obstructive pulmonary disease, unspecified: Secondary | ICD-10-CM | POA: Diagnosis not present

## 2015-06-25 DIAGNOSIS — J982 Interstitial emphysema: Secondary | ICD-10-CM | POA: Diagnosis not present

## 2015-07-07 ENCOUNTER — Encounter: Payer: Self-pay | Admitting: Cardiovascular Disease

## 2015-07-07 ENCOUNTER — Ambulatory Visit (INDEPENDENT_AMBULATORY_CARE_PROVIDER_SITE_OTHER): Payer: Commercial Managed Care - HMO | Admitting: Cardiovascular Disease

## 2015-07-07 VITALS — BP 98/50 | HR 68 | Ht 65.0 in | Wt 249.0 lb

## 2015-07-07 DIAGNOSIS — E1140 Type 2 diabetes mellitus with diabetic neuropathy, unspecified: Secondary | ICD-10-CM

## 2015-07-07 DIAGNOSIS — I5032 Chronic diastolic (congestive) heart failure: Secondary | ICD-10-CM | POA: Diagnosis not present

## 2015-07-07 DIAGNOSIS — I10 Essential (primary) hypertension: Secondary | ICD-10-CM | POA: Diagnosis not present

## 2015-07-07 DIAGNOSIS — I25118 Atherosclerotic heart disease of native coronary artery with other forms of angina pectoris: Secondary | ICD-10-CM | POA: Diagnosis not present

## 2015-07-07 DIAGNOSIS — R609 Edema, unspecified: Secondary | ICD-10-CM

## 2015-07-07 DIAGNOSIS — N183 Chronic kidney disease, stage 3 unspecified: Secondary | ICD-10-CM

## 2015-07-07 DIAGNOSIS — Z794 Long term (current) use of insulin: Secondary | ICD-10-CM

## 2015-07-07 MED ORDER — ISOSORBIDE MONONITRATE ER 60 MG PO TB24: 60.0000 mg | ORAL_TABLET | Freq: Two times a day (BID) | ORAL | Status: DC

## 2015-07-07 MED ORDER — CLONIDINE HCL 0.3 MG PO TABS: 0.3000 mg | ORAL_TABLET | Freq: Three times a day (TID) | ORAL | Status: DC

## 2015-07-07 MED ORDER — CLOPIDOGREL BISULFATE 75 MG PO TABS: ORAL_TABLET | ORAL | Status: DC

## 2015-07-07 MED ORDER — CARVEDILOL 25 MG PO TABS: ORAL_TABLET | ORAL | Status: DC

## 2015-07-07 MED ORDER — HYDRALAZINE HCL 25 MG PO TABS: 25.0000 mg | ORAL_TABLET | Freq: Three times a day (TID) | ORAL | Status: DC

## 2015-07-07 NOTE — Assessment & Plan Note (Signed)
Complemented him on his recent weight loss Recommended a regular walking program, strict diet, low carbohydrates

## 2015-07-07 NOTE — Assessment & Plan Note (Signed)
Baseline creatinine 2.0 Recommended avoiding NSAIDs

## 2015-07-07 NOTE — Progress Notes (Signed)
Patient ID: Stephen [name], male    DOB: 13-Mar-1946, 70 y.o.   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 Status post stent to his left renal artery for 80+ percent stenosis several weeks ago, performed by Dr. Lucky Cowboy. Also had laser treatment of the lower extremity veins for leg swelling/venous insufficiency  In follow-up he reports having some lightheaded episodes when he stands up quickly Denies any significant chest pain on exertion He and his wife recently sold their house, moved in with the in-laws  one with recent fracture, one with dementia weight is down from his prior clinic visit, 263 pounds down to 249 pounds  Denies having any recent chest pain symptoms concerning for angina  EKG on today's visit shows normal sinus rhythm with rate 68 bpm, old anterior MI, unable to exclude inferior MI  Other past medical history Total cholesterol 121, hemoglobin A1c 8.0, creatinine 2.0.   Stress test July 2011 was dobutamine stress that showed no significant ischemia, inadequate heart rate was achieved. right and left heart catheterization January 2011 showed 50% mid LAD disease, 60% at the ostium of the diagonal #2, 30% proximal left circumflex disease, patent stent in the left circumflex, patent stent of the mid RCA, wedge pressure of 16, PA pressure mean 22, right ventricular pressure 41/8 .   echocardiogram September 2010 shows normal systolic function, diastolic dysfunction, normal RV function unable to evaluate right ventricular  systolic pressures    Allergies  Allergen Reactions  . Calcium Channel Blockers     Would avoid if possible due to h/o peripheral edema    Outpatient Encounter Prescriptions as of 07/07/2015  Medication Sig  . aspirin 81 MG tablet Take 81 mg by mouth 2 (two) times daily.    Marland Kitchen atorvastatin (LIPITOR) 80 MG tablet Take 1 tablet (80 mg total) by mouth at bedtime.  . carvedilol (COREG) 25 MG tablet TAKE 1 TABLET TWICE DAILY WITH A MEAL  . cloNIDine (CATAPRES) 0.3 MG tablet Take 1 tablet (0.3 mg total) by mouth 3 (three) times daily.  . clopidogrel (PLAVIX) 75 MG tablet TAKE ONE TABLET BY MOUTH EVERY DAY  . finasteride (PROSCAR) 5 MG tablet Once daily  . furosemide (LASIX) 80 MG tablet Take 1 tablet (80 mg total) by mouth every other day.  . gabapentin (NEURONTIN) 800 MG tablet TAKE 1 TABLET IN THE MORNING  AND TAKE 1 AND 1/2 TABLETS AT BEDTIME  . glucose blood (ONE TOUCH TEST STRIPS) test strip Ck blood sugar three times daily and as needed. Dx. 250.62  . insulin NPH-regular Human (NOVOLIN 70/30) (70-30) 100 UNIT/ML injection INJECT  70 UNITS SUBCUTANEOUSLY EVERY MORNING  AND  35-45 UNITS IN THE EVENING  . Insulin Syringe-Needle U-100 (INSULIN SYRINGE 1CC/30GX5/16") 30G X 5/16" 1 ML MISC Use as directed to take insulin Dx 250.62  . isosorbide mononitrate (IMDUR) 60 MG 24 hr tablet Take 1 tablet (60 mg total) by mouth 2 (two) times daily.  . nitroGLYCERIN (NITROSTAT) 0.4 MG SL tablet Place 1 tablet (0.4 mg total) under the tongue every 5 (five) minutes as needed. May repeat x3  .  NON FORMULARY Oxygen 2 liters 24/7  . omeprazole (PRILOSEC) 20 MG capsule Take 20 mg by mouth daily.    Marland Kitchen oxyCODONE-acetaminophen (PERCOCET/ROXICET) 5-325 MG per tablet Take 1-2 tablets by mouth every 6 (six) hours as needed (for back pain. sedation caution).  . triamterene-hydrochlorothiazide (MAXZIDE-25) 37.5-25 MG per tablet Take 1 tablet by mouth daily.  . valsartan-hydrochlorothiazide (DIOVAN-HCT) 320-25 MG per  tablet Take 0.5 tablets by mouth daily.  . [DISCONTINUED] carvedilol (COREG) 25 MG tablet TAKE 1 TABLET TWICE DAILY WITH A MEAL  . [DISCONTINUED] cloNIDine (CATAPRES) 0.3 MG tablet Take 1 tablet (0.3 mg total) by mouth 3 (three) times daily.  . [DISCONTINUED] clopidogrel (PLAVIX) 75 MG tablet TAKE ONE TABLET BY MOUTH EVERY DAY  . [DISCONTINUED] hydrALAZINE (APRESOLINE) 25 MG tablet Take 1 tablet (25 mg total) by mouth 3 (three) times daily.  . [DISCONTINUED] hydrALAZINE (APRESOLINE) 25 MG tablet Take 1 tablet (25 mg total) by mouth 3 (three) times daily.  . [DISCONTINUED] isosorbide mononitrate (IMDUR) 60 MG 24 hr tablet Take 1 tablet (60 mg total) by mouth 2 (two) times daily.  . [DISCONTINUED] traZODone (DESYREL) 50 MG tablet Take 1 tablet (50 mg total) by mouth at bedtime as needed for sleep. (Patient not taking: Reported on 07/07/2015)   No facility-administered encounter medications on file as of 07/07/2015.    Past Medical History  Diagnosis Date  . 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 DM2 (diabetes mellitus, type 2) (HCC)     insulin requiring  . Diabetes mellitus with neuropathy ([place])   . OSA (obstructive sleep apnea)   . HTN (hypertension)   . CHF (congestive heart failure) ([place])   . Kidney stones   . Dyslipidemia   . Metabolic syndrome   . Arthritis   . GERD (gastroesophageal reflux disease)   . Heart disease   . High cholesterol   . Kidney disease   . Colon polyps   . Thyroid disorder   . Recurrent UTI   . 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 Adrenal gland anomaly   . Skin cancer     head  . Rotator cuff disorder     right  . Renal artery stenosis (HCC)     L sided, stent 07/2013    Past Surgical History  Procedure Laterality Date  . 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 Coronary angioplasty with stent placement  2007    x 2  . Knee surgery       right  . Lung biopsy    . Carpal tunnel release      bilateral  . Skin cancer excision      back of head  . Renal artery stent  2015    L    Social History  reports that he has never smoked. He has never used smokeless tobacco. He reports that he does not drink alcohol or use illicit drugs.  Family History family history includes Arthritis in his maternal grandmother; Breast cancer in his maternal aunt; COPD in his sister; Diabetes in his maternal aunt; Heart attack in his brother, father, and mother; Hypertension in his maternal aunt and other.  Review of Systems  Constitutional: Negative.   Respiratory: Positive for shortness of breath.   Cardiovascular: Positive for leg swelling.  Gastrointestinal: Negative.   Musculoskeletal: Negative.  Skin: Negative.   Neurological: Negative.   Hematological: Negative.   Psychiatric/Behavioral: Negative.   All other systems reviewed and are negative.  BP 98/50 mmHg  Pulse 68  Ht 5\' 5"  (1.651 m)  Wt 249 lb (112.946 kg)  BMI 41.44 kg/m2  Physical Exam  Constitutional: He is oriented to person, place, and time. He appears well-developed and well-nourished.  Morbidly obese.  HENT:  Head: Normocephalic.  Nose: Nose normal.  Mouth/Throat: Oropharynx is clear and moist.  Eyes: Conjunctivae are normal. Pupils are equal, round, and reactive to light.  Neck: Normal range of motion. Neck supple. No JVD present.  Cardiovascular: Normal rate, regular rhythm, S1 normal, S2 normal, normal heart sounds and intact distal pulses.  Exam reveals no gallop and no friction rub.   No murmur heard. Trace  edema of the lower extremities to the mid shin  Pulmonary/Chest: Effort normal and breath sounds normal. No respiratory distress. He has no wheezes. He has no rales. He exhibits no tenderness.  Abdominal: Soft. Bowel sounds are normal. He exhibits no distension. There is no tenderness.  Musculoskeletal: Normal range of motion. He exhibits edema. He  exhibits no tenderness.  Lymphadenopathy:    He has no cervical adenopathy.  Neurological: He is alert and oriented to person, place, and time. Coordination normal.  Skin: Skin is warm and dry. No rash noted. No erythema.  Psychiatric: He has a normal mood and affect. His behavior is normal. Judgment and thought content normal.      Assessment and Plan   Nursing note and vitals reviewed.

## 2015-07-07 NOTE — Patient Instructions (Addendum)
You are doing well.  Please stop hydralazine Blood pressure is low  Please monitor your blood pressure cuff Call the office if numbers run high  Please call us if you have new issues that need to be addressed before your next appt.  Your physician wants you to follow-up in: 3 months.  You will receive a reminder letter in the mail two months in advance. If you don't receive a letter, please call our office to schedule the follow-up appointment.

## 2015-07-07 NOTE — Assessment & Plan Note (Signed)
Currently with no symptoms of angina. No further workup at this time. Continue current medication regimen. 

## 2015-07-07 NOTE — Assessment & Plan Note (Signed)
Right edema after traumatic injury, minimal edema on the left Component of venous insufficiency  recommended compression hose

## 2015-07-07 NOTE — Assessment & Plan Note (Signed)
We have encouraged continued exercise, careful diet management in an effort to lose weight. 

## 2015-07-07 NOTE — Assessment & Plan Note (Signed)
Blood pressures running low, even on recheck. We will hold his hydralazine Recommended he monitor his blood pressure at home, call our office if he continues to have orthostasis Blood pressure on my check was 123456 systolic sitting

## 2015-07-07 NOTE — Assessment & Plan Note (Signed)
Appears relatively euvolemic on today's visit. Hold hydralazine above, otherwise no medication changes made No recent BMP available

## 2015-07-09 DIAGNOSIS — J449 Chronic obstructive pulmonary disease, unspecified: Secondary | ICD-10-CM | POA: Diagnosis not present

## 2015-07-26 DIAGNOSIS — J982 Interstitial emphysema: Secondary | ICD-10-CM | POA: Diagnosis not present

## 2015-08-03 ENCOUNTER — Other Ambulatory Visit: Payer: Self-pay | Admitting: Cardiovascular Disease

## 2015-08-03 ENCOUNTER — Other Ambulatory Visit: Payer: Self-pay | Admitting: Family Medicine

## 2015-08-03 NOTE — Telephone Encounter (Signed)
Electronic refill request. Last Filled:    225 tablet 3 04/03/2014  Please advise.

## 2015-08-04 NOTE — Telephone Encounter (Signed)
Sent. Thanks.   

## 2015-08-09 DIAGNOSIS — J449 Chronic obstructive pulmonary disease, unspecified: Secondary | ICD-10-CM | POA: Diagnosis not present

## 2015-08-20 ENCOUNTER — Other Ambulatory Visit (INDEPENDENT_AMBULATORY_CARE_PROVIDER_SITE_OTHER): Payer: Commercial Managed Care - HMO

## 2015-08-20 ENCOUNTER — Other Ambulatory Visit: Payer: Self-pay | Admitting: Family Medicine

## 2015-08-20 DIAGNOSIS — Z119 Encounter for screening for infectious and parasitic diseases, unspecified: Secondary | ICD-10-CM | POA: Diagnosis not present

## 2015-08-20 DIAGNOSIS — E119 Type 2 diabetes mellitus without complications: Secondary | ICD-10-CM

## 2015-08-20 LAB — HEMOGLOBIN A1C: Hgb A1c MFr Bld: 8.3 % — ABNORMAL HIGH (ref 4.6–6.5)

## 2015-08-21 LAB — HEPATITIS C ANTIBODY: HCV Ab: NEGATIVE

## 2015-08-26 DIAGNOSIS — J982 Interstitial emphysema: Secondary | ICD-10-CM | POA: Diagnosis not present

## 2015-08-27 ENCOUNTER — Encounter: Payer: Self-pay | Admitting: Family Medicine

## 2015-08-27 ENCOUNTER — Ambulatory Visit (INDEPENDENT_AMBULATORY_CARE_PROVIDER_SITE_OTHER): Payer: Commercial Managed Care - HMO | Admitting: Family Medicine

## 2015-08-27 VITALS — BP 154/80 | HR 59 | Temp 98.1°F | Wt 248.0 lb

## 2015-08-27 DIAGNOSIS — E1122 Type 2 diabetes mellitus with diabetic chronic kidney disease: Secondary | ICD-10-CM | POA: Diagnosis not present

## 2015-08-27 DIAGNOSIS — M5441 Lumbago with sciatica, right side: Secondary | ICD-10-CM

## 2015-08-27 DIAGNOSIS — Z794 Long term (current) use of insulin: Secondary | ICD-10-CM | POA: Diagnosis not present

## 2015-08-27 DIAGNOSIS — I10 Essential (primary) hypertension: Secondary | ICD-10-CM

## 2015-08-27 MED ORDER — OXYCODONE-ACETAMINOPHEN 5-325 MG PO TABS: 1.0000 | ORAL_TABLET | Freq: Four times a day (QID) | ORAL | Status: DC | PRN

## 2015-08-27 NOTE — Assessment & Plan Note (Signed)
Still with flares of pain, using oxycodone prn, "to get the edge off"  Still with daily pain, lower back and R/L leg, at baseline. No B/B sx.   He isn't interested in more invasive tx at this point and that is reasonable.   Routine cautions given on med.

## 2015-08-27 NOTE — Progress Notes (Signed)
Pre visit review using our clinic review tool, if applicable. No additional management support is needed unless otherwise documented below in the visit note.  He has been working hard to care for his in laws.  His father in law is really debilitated.  He has moved in with the in laws to care for them.   Diabetes:  Using medications without difficulties: yes Hypoglycemic episodes: rare, if prolonged fasting, usually <1 month.   Hyperglycemic episodes: no sx.  Feet problems: no Blood Sugars averaging: usually ~125 in the AMs, higher in the day.  eye exam within last year: due, about 1 year ago.  He'll call about that.   A1c d/w pt.  Up some, d/w pt.  Needs rx for meter and strips/lancets.   He has been trying to get out of the house and get a break.    HTN.  BP has been down to 110s/60.  He is off ARB.  D/w pt.  He'll f/u with renal soon. His BP has been improved in the meantime, ie not lightheaded or as low as prev.   Back pain, occ flare, using oxycodone prn, "to get the edge off"  Still with daily pain, lower back and R/L leg, at baseline. No B/B sx.    PMH and SH reviewed  Meds, vitals, and allergies reviewed.   ROS: See HPI.  Otherwise negative.    GEN: nad, alert and oriented, on O2 HEENT: mucous membranes moist NECK: supple w/o LA CV: rrr. PULM: ctab, no inc wob ABD: soft, +bs EXT: R>L leg edema at baseline.  The calves are not tight, ie less swelling now than I have seen prev on patient.   Diabetic foot exam: Normal inspection No skin breakdown No calluses  Normal DP pulses Normal sensation to light touch and monofilament Nails normal

## 2015-08-27 NOTE — Assessment & Plan Note (Addendum)
He'll work on diet and he may need to adjust his AM insulin upward, should he continue to have goal sugars in the AM but higher later in the day.  He'll work on exercise as his lung capacity allows.  D/w pt.  He agrees.  Recheck in about 3 months.  a1c d/w pt.  Rx done for meter and strips/lancets.  >25 minutes spent in face to face time with patient, >50% spent in counselling or coordination of care.

## 2015-08-27 NOTE — Assessment & Plan Note (Signed)
Reasonable control usually, slightly up today.  BP has been down to 110s/60.  He is off ARB.  D/w pt.  He'll f/u with renal soon. His BP has been improved in the meantime, ie not lightheaded or as low as prev.  I didn't change his meds today.

## 2015-08-27 NOTE — Patient Instructions (Signed)
Tell the kidney clinic that you are off the valsartan.  Keep working on your diet for now.  If you have elevated sugars later in the day, you may need to slowly increase your morning insulin by a few units.  Take care.  Glad to see you.  I want to recheck your labs in about 3 months before a visit, since your A1c was up.

## 2015-09-06 DIAGNOSIS — J449 Chronic obstructive pulmonary disease, unspecified: Secondary | ICD-10-CM | POA: Diagnosis not present

## 2015-09-23 DIAGNOSIS — J982 Interstitial emphysema: Secondary | ICD-10-CM | POA: Diagnosis not present

## 2015-10-01 ENCOUNTER — Ambulatory Visit: Payer: Medicare HMO | Admitting: Cardiovascular Disease

## 2015-10-01 ENCOUNTER — Encounter: Payer: Self-pay | Admitting: *Deleted

## 2015-10-07 DIAGNOSIS — J449 Chronic obstructive pulmonary disease, unspecified: Secondary | ICD-10-CM | POA: Diagnosis not present

## 2015-10-20 DIAGNOSIS — R809 Proteinuria, unspecified: Secondary | ICD-10-CM | POA: Diagnosis not present

## 2015-10-20 DIAGNOSIS — N183 Chronic kidney disease, stage 3 (moderate): Secondary | ICD-10-CM | POA: Diagnosis not present

## 2015-10-20 DIAGNOSIS — I701 Atherosclerosis of renal artery: Secondary | ICD-10-CM | POA: Diagnosis not present

## 2015-10-20 DIAGNOSIS — R600 Localized edema: Secondary | ICD-10-CM | POA: Diagnosis not present

## 2015-10-24 DIAGNOSIS — J982 Interstitial emphysema: Secondary | ICD-10-CM | POA: Diagnosis not present

## 2015-10-27 ENCOUNTER — Other Ambulatory Visit: Payer: Self-pay | Admitting: Cardiovascular Disease

## 2015-11-04 ENCOUNTER — Telehealth: Payer: Self-pay | Admitting: Cardiovascular Disease

## 2015-11-04 MED ORDER — TRIAMTERENE-HCTZ 37.5-25 MG PO TABS: 1.0000 | ORAL_TABLET | Freq: Every day | ORAL | Status: DC

## 2015-11-04 NOTE — Telephone Encounter (Signed)
Refill sent for (Maxzide 25) 37.5-25 mg tablet #90 day

## 2015-11-04 NOTE — Telephone Encounter (Signed)
*  STAT* If patient is at the pharmacy, call can be transferred to refill team.   1. Which medications need to be refilled? (please list name of each medication and dose if known)  triamterene-hydrochlorothiazide (MAXZIDE-25) 37.5-25 MG tablet   2. Which pharmacy/location (including street and city if local pharmacy) is medication to be sent to? [facility]  3. Do they need a 30 day or 90 day supply? 90 day  Dr. Holley Raring wants patient to stay on this due to his kidney.

## 2015-11-06 DIAGNOSIS — J449 Chronic obstructive pulmonary disease, unspecified: Secondary | ICD-10-CM | POA: Diagnosis not present

## 2015-11-08 ENCOUNTER — Other Ambulatory Visit: Payer: Self-pay | Admitting: Family Medicine

## 2015-11-08 DIAGNOSIS — Z794 Long term (current) use of insulin: Secondary | ICD-10-CM

## 2015-11-08 DIAGNOSIS — E1122 Type 2 diabetes mellitus with diabetic chronic kidney disease: Secondary | ICD-10-CM

## 2015-11-09 ENCOUNTER — Telehealth: Payer: Self-pay | Admitting: Cardiovascular Disease

## 2015-11-09 ENCOUNTER — Other Ambulatory Visit: Payer: Self-pay | Admitting: Cardiovascular Disease

## 2015-11-09 ENCOUNTER — Other Ambulatory Visit: Payer: Self-pay | Admitting: *Deleted

## 2015-11-09 MED ORDER — HYDRALAZINE HCL 25 MG PO TABS: 25.0000 mg | ORAL_TABLET | Freq: Three times a day (TID) | ORAL | Status: DC

## 2015-11-09 NOTE — Telephone Encounter (Signed)
Requested Prescriptions   Signed Prescriptions Disp Refills  . hydrALAZINE (APRESOLINE) 25 MG tablet 90 tablet 1    Sig: Take 1 tablet (25 mg total) by mouth 3 (three) times daily.    Authorizing Provider: Minna Merritts    Ordering User: Britt Bottom

## 2015-11-09 NOTE — Telephone Encounter (Signed)
Stephen [name] is calling asking if we can send in a 30 day supply of Hydralazine 25 mg to local pharmacy For it will take a while before patient can get enough from them. Please send it To [facility] in [place]

## 2015-11-10 ENCOUNTER — Other Ambulatory Visit: Payer: Self-pay | Admitting: Internal Medicine

## 2015-11-10 ENCOUNTER — Telehealth: Payer: Self-pay | Admitting: Family Medicine

## 2015-11-10 MED ORDER — OXYCODONE-ACETAMINOPHEN 5-325 MG PO TABS: 1.0000 | ORAL_TABLET | Freq: Four times a day (QID) | ORAL | Status: DC | PRN

## 2015-11-10 NOTE — Telephone Encounter (Signed)
RX printed and signed. Given [employer]

## 2015-11-10 NOTE — Telephone Encounter (Signed)
Tried to call patient back. Unable to leave message because voicemail has not been set up. Will try again later.

## 2015-11-10 NOTE — Telephone Encounter (Signed)
Patient notified by telephone that script is up front ready for pickup. 

## 2015-11-10 NOTE — Telephone Encounter (Signed)
Spoke to patient and was advised that he needs a refill on his Percocet. Patient stated that Dr. [name] gives him this pain medication because he has a pinched nerve in his back. Advised patient that Dr. [name] is out of the office and message will go to another provider.  Last refill 08/27/15 #50 and last office visit same day Call patient when script is ready for pickup.

## 2015-11-10 NOTE — Telephone Encounter (Signed)
Pt would like you to call him Regarding his back pain leg pain Offered team health pt refused

## 2015-11-19 ENCOUNTER — Other Ambulatory Visit (INDEPENDENT_AMBULATORY_CARE_PROVIDER_SITE_OTHER): Payer: Commercial Managed Care - HMO

## 2015-11-19 DIAGNOSIS — E1122 Type 2 diabetes mellitus with diabetic chronic kidney disease: Secondary | ICD-10-CM | POA: Diagnosis not present

## 2015-11-19 DIAGNOSIS — Z794 Long term (current) use of insulin: Secondary | ICD-10-CM | POA: Diagnosis not present

## 2015-11-19 LAB — HEMOGLOBIN A1C: Hgb A1c MFr Bld: 8.9 % — ABNORMAL HIGH (ref 4.6–6.5)

## 2015-11-23 DIAGNOSIS — J982 Interstitial emphysema: Secondary | ICD-10-CM | POA: Diagnosis not present

## 2015-11-25 ENCOUNTER — Encounter: Payer: Self-pay | Admitting: Family Medicine

## 2015-11-25 ENCOUNTER — Ambulatory Visit (INDEPENDENT_AMBULATORY_CARE_PROVIDER_SITE_OTHER): Payer: Commercial Managed Care - HMO | Admitting: Family Medicine

## 2015-11-25 VITALS — BP 140/66 | HR 62 | Temp 97.9°F | Wt 257.5 lb

## 2015-11-25 DIAGNOSIS — M5441 Lumbago with sciatica, right side: Secondary | ICD-10-CM

## 2015-11-25 DIAGNOSIS — I10 Essential (primary) hypertension: Secondary | ICD-10-CM

## 2015-11-25 DIAGNOSIS — Z794 Long term (current) use of insulin: Secondary | ICD-10-CM

## 2015-11-25 DIAGNOSIS — E1122 Type 2 diabetes mellitus with diabetic chronic kidney disease: Secondary | ICD-10-CM | POA: Diagnosis not present

## 2015-11-25 MED ORDER — OXYCODONE HCL 15 MG PO TABS: 15.0000 mg | ORAL_TABLET | Freq: Every evening | ORAL | Status: DC

## 2015-11-25 NOTE — Assessment & Plan Note (Signed)
I can't see how his A1c/Dm2 will get better w/o pain relief, sleep, and some help at home.  He is awaiting [facility] input re: his father in law.  Will work on pain/sleep issues in the meantime.  He agrees.  Recheck in about 3 months.

## 2015-11-25 NOTE — Assessment & Plan Note (Signed)
Inc oxycodone to 15mg  at night.  Continue gabapentin as is, given his GFR.  He'll update me.  We may need to have him see neurosurgery again, in the future.  He agrees.  No ADE on meds.  Not sedated.

## 2015-11-25 NOTE — Patient Instructions (Signed)
Don't go up on the gabapentin.   Try taking 3 of the old oxycodone at night ---->15mg .  When you use up the meds you have, make the change to the new dose.  You'll only take 1 pill at night since it is a higher strength.  If not better, then we'll need to get you back to the spine clinic.   I wouldn't change your insulin in the meantime.  Keep working on your diet and don't forget your nighttime snack.  Recheck in about 3 months, labs ahead of time.  Take care.  Glad to see you.

## 2015-11-25 NOTE — Assessment & Plan Note (Signed)
BP improved today, no change in meds.  >25 minutes spent in face to face time with patient, >50% spent in counselling or coordination of care.

## 2015-11-25 NOTE — Progress Notes (Signed)
Pre visit review using our clinic review tool, if applicable. No additional management support is needed unless otherwise documented below in the visit note.  He continues to have a difficult home situation, caring for his in laws.  His father in law fell recently.  Pt's wife was checking on placement/help options, pt's wife was to d/w her father's PCP.  This continues to be a challenging situation for all of them.  They are awaiting word from the [place] about benefits, to help with placement.    His R sided back and hip pain continue. Worse at night.  Taking 10mg  oxycodone to try to get some relief, usually dosed at night.  Still waking up in pain a few hours later.  When he takes oxycodone, he has some mild relief, not full relief, but he is able to get to sleep.  It is short duration of relief.  Still using heating pad in day.  Prev MRI done.  He had prev neurosurgery eval done but wasn't at the point of needing surgery.  D/w pt about re-eval today.  He's at the point of considering reconsult with neurosurgery, should he not be able to get some relief in the near future.  No foot drop or emergent neuro sx.   Diabetes:  Using medications without difficulties:yes Hypoglycemic episodes: occ lows noted, resolved with snack.  (he had forgotten to take a snack before going to bed, d/w pt about PM snack- he usually gets a snack and doesn't have a low). Hyperglycemic episodes:no Feet problems: at baseline, tingling and pain some better with gabapentin Blood Sugars averaging:  ~140s usually in the AM eye exam within last year: due, d/w pt.   A1c up, d/w pt.  He thinks this is related to social upheaval at the house affecting his diet.  He is still trying to work on diet. Avoid carbs in general.    Meds, vitals, and allergies reviewed.   ROS: Per HPI unless specifically indicated in ROS section   GEN: nad, alert and oriented, on O2 HEENT: mucous membranes moist NECK: supple w/o LA CV: rrr. PULM: ctab, no  inc wob ABD: soft, +bs EXT: 1-2+ R>L BLE edema

## 2015-12-07 DIAGNOSIS — J449 Chronic obstructive pulmonary disease, unspecified: Secondary | ICD-10-CM | POA: Diagnosis not present

## 2015-12-14 ENCOUNTER — Encounter: Payer: Self-pay | Admitting: Cardiovascular Disease

## 2015-12-14 ENCOUNTER — Ambulatory Visit (INDEPENDENT_AMBULATORY_CARE_PROVIDER_SITE_OTHER): Payer: Commercial Managed Care - HMO | Admitting: Cardiovascular Disease

## 2015-12-14 VITALS — BP 136/70 | HR 55 | Ht 65.0 in | Wt 259.0 lb

## 2015-12-14 DIAGNOSIS — E785 Hyperlipidemia, unspecified: Secondary | ICD-10-CM

## 2015-12-14 DIAGNOSIS — I5032 Chronic diastolic (congestive) heart failure: Secondary | ICD-10-CM

## 2015-12-14 DIAGNOSIS — I10 Essential (primary) hypertension: Secondary | ICD-10-CM | POA: Diagnosis not present

## 2015-12-14 DIAGNOSIS — N183 Chronic kidney disease, stage 3 unspecified: Secondary | ICD-10-CM

## 2015-12-14 DIAGNOSIS — J8410 Pulmonary fibrosis, unspecified: Secondary | ICD-10-CM

## 2015-12-14 DIAGNOSIS — Z794 Long term (current) use of insulin: Secondary | ICD-10-CM

## 2015-12-14 DIAGNOSIS — I2510 Atherosclerotic heart disease of native coronary artery without angina pectoris: Secondary | ICD-10-CM

## 2015-12-14 DIAGNOSIS — E1122 Type 2 diabetes mellitus with diabetic chronic kidney disease: Secondary | ICD-10-CM

## 2015-12-14 NOTE — Patient Instructions (Signed)
You are doing well. No medication changes were made.  Please call us if you have new issues that need to be addressed before your next appt.  Your physician wants you to follow-up in: 6 months.  You will receive a reminder letter in the mail two months in advance. If you don't receive a letter, please call our office to schedule the follow-up appointment.   

## 2015-12-14 NOTE — Progress Notes (Signed)
Patient ID: Stephen [name], male   DOB: 09-15-1945, 70 y.o.   MRN: [id number] Cardiology Office Note  Date:  12/14/2015   ID:  Stephen [name] 05/10/1946, MRN [id number]  PCP:  Elsie Stain, MD   Chief Complaint  Patient presents with  . other    3 month follow up. Meds reviewed by the patient verbally. "doing well."    HPI:  Stephen [name] is a very pleasant 63 year old gentleman with a history of obesity,coronary artery disease, PCI to the mid RCA and mid left circumflex in October 2007, interstitial fibrosis on chronic oxygen, with wedge resection, hypertension, hyperlipidemia, remote tobacco abuse, chronic substernal chest pain who presents for routine followup of his coronary artery disease History of diabetes Open lung biopsy was performed November 2009 for bilateral pulmonary infiltrates. Status post stent to his left renal artery for 80+ percent stenosis several weeks ago, performed by Dr. Lucky Cowboy. Also had laser treatment of the lower extremity veins for leg swelling/venous insufficiency  In follow-up, he reports having continued leg swelling right greater than left, stable Struggling with his sugars, hemoglobin A1c 8.9. Other lab work including total cholesterol 119, LDL 71 Recent follow-up with Dr. Holley Raring for underlying renal dysfunction Put back on hydralazine, denies any orthostasis.  On his last clinic visit blood pressure was low with orthostasis symptoms, hydralazine was held Denies any significant chest pain on exertion Reports his breathing status is stable, 1-2 L nasal cannula oxygen Stress at home, living with the in-laws  Other past medical history Total cholesterol 121, hemoglobin A1c 8.0, creatinine 2.0.   Stress test July 2011 was dobutamine stress that showed no significant ischemia, inadequate heart rate was achieved. right and left heart catheterization January 2011 showed 50% mid LAD disease, 60% at the ostium of the diagonal #2, 30% proximal left  circumflex disease, patent stent in the left circumflex, patent stent of the mid RCA, wedge pressure of 16, PA pressure mean 22, right ventricular pressure 41/8 .  echocardiogram September 2010 shows normal systolic function, diastolic dysfunction, normal RV function unable to evaluate right ventricular systolic pressures  PMH:   has a past medical history of CAD (coronary artery disease); DM2 (diabetes mellitus, type 2) ([place]); Diabetes mellitus with neuropathy ([place]); OSA (obstructive sleep apnea); HTN (hypertension); CHF (congestive heart failure) ([place]); Kidney stones; Dyslipidemia; Metabolic syndrome; Arthritis; GERD (gastroesophageal reflux disease); Heart disease; High cholesterol; Kidney disease; Colon polyps; Thyroid disorder; Recurrent UTI; Pulmonary fibrosis ([place]); Adrenal gland anomaly; Skin cancer; Rotator cuff disorder; and Renal artery stenosis ([place]).  PSH:    Past Surgical History  Procedure Laterality Date  . 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 Coronary angioplasty with stent placement  2007    x 2  . Knee surgery      right  . Lung biopsy    . Carpal tunnel release      bilateral  . Skin cancer excision      back of head  . Renal artery stent  2015    L    Current Outpatient Prescriptions  Medication Sig Dispense Refill  . aspirin 81 MG tablet Take 81 mg by mouth 2 (two) times daily.      Marland Kitchen atorvastatin (LIPITOR) 80 MG tablet TAKE 1 TABLET AT BEDTIME 90 tablet 1  . carvedilol (COREG) 25 MG tablet TAKE 1 TABLET TWICE DAILY WITH A MEAL 180 tablet 1  . cloNIDine (CATAPRES) 0.3 MG tablet TAKE 1 TABLET THREE TIMES DAILY 270  tablet 3  . clopidogrel (PLAVIX) 75 MG tablet TAKE 1 TABLET EVERY DAY 90 tablet 3  . finasteride (PROSCAR) 5 MG tablet Once daily    . furosemide (LASIX) 80 MG tablet TAKE 1 TABLET EVERY OTHER DAY 45 tablet 1  . gabapentin (NEURONTIN) 800 MG tablet TAKE 1 TABLET IN THE MORNING  AND TAKE 1 AND 1/2 TABLETS AT BEDTIME 225 tablet 3  . hydrALAZINE  (APRESOLINE) 25 MG tablet Take 1 tablet (25 mg total) by mouth 3 (three) times daily. 90 tablet 1  . insulin NPH-regular Human (NOVOLIN 70/30) (70-30) 100 UNIT/ML injection INJECT  70 UNITS SUBCUTANEOUSLY EVERY MORNING  AND  35-45 UNITS IN THE EVENING 10 vial 1  . Insulin Syringe-Needle U-100 (INSULIN SYRINGE 1CC/30GX5/16") 30G X 5/16" 1 ML MISC Use as directed to take insulin Dx 250.62 180 each 1  . isosorbide mononitrate (IMDUR) 60 MG 24 hr tablet TAKE 1 TABLET TWICE DAILY 180 tablet 3  . nitroGLYCERIN (NITROSTAT) 0.4 MG SL tablet Place 1 tablet (0.4 mg total) under the tongue every 5 (five) minutes as needed. May repeat x3 25 tablet prn  . NON FORMULARY Oxygen 2 liters 24/7    . omeprazole (PRILOSEC) 20 MG capsule Take 20 mg by mouth daily.      Marland Kitchen oxyCODONE (ROXICODONE) 15 MG immediate release tablet Take 1 tablet (15 mg total) by mouth every evening. 30 tablet 0  . triamterene-hydrochlorothiazide (MAXZIDE-25) 37.5-25 MG tablet Take 1 tablet by mouth daily. 90 tablet 3   No current facility-administered medications for this visit.     Allergies:   Calcium channel blockers   Social History:  The patient  reports that he has never smoked. He has never used smokeless tobacco. He reports that he does not drink alcohol or use illicit drugs.   Family History:   family history includes Arthritis in his maternal grandmother; Breast cancer in his maternal aunt; COPD in his sister; Diabetes in his maternal aunt; Heart attack in his brother, father, and mother; Hypertension in his maternal aunt and other.    Review of Systems: Review of Systems  Constitutional: Negative.   Respiratory: Negative.   Cardiovascular: Negative.   Gastrointestinal: Negative.   Musculoskeletal: Negative.   Neurological: Negative.   Psychiatric/Behavioral: Negative.   All other systems reviewed and are negative.    PHYSICAL EXAM: VS:  BP 136/70 mmHg  Pulse 55  Ht 5\' 5"  (1.651 m)  Wt 259 lb (117.482 kg)  BMI  43.10 kg/m2  SpO2 95% , BMI Body mass index is 43.1 kg/(m^2). GEN: Well nourished, well developed, in no acute distress, Obese HEENT: normal Neck: no JVD, carotid bruits, or masses Cardiac: RRR; no murmurs, rubs, or gallops, nonpitting edema to b/l knees Respiratory:  clear to auscultation bilaterally, normal work of breathing GI: soft, nontender, nondistended, + BS MS: no deformity or atrophy Skin: warm and dry, no rash Neuro:  Strength and sensation are intact Psych: euthymic mood, full affect    Recent Labs: No results found for requested labs within last 365 days.    Lipid Panel Lab Results  Component Value Date   CHOL 119 10/21/2014   HDL 23.90* 10/21/2014   LDLCALC 71 10/21/2014   TRIG 121.0 10/21/2014      Wt Readings from Last 3 Encounters:  12/14/15 259 lb (117.482 kg)  11/25/15 257 lb 8 oz (116.801 kg)  08/27/15 248 lb (112.492 kg)       ASSESSMENT AND PLAN:  Atherosclerosis of native coronary  artery of native heart without angina pectoris Currently with no symptoms of angina. No further workup at this time. Continue current medication regimen.  Essential hypertension, benign Blood pressure is well controlled on today's visit. No changes made to the medications.  HLD (hyperlipidemia) Cholesterol is at goal on the current lipid regimen. No changes to the medications were made.  Chronic diastolic CHF (congestive heart failure) (HCC) Appears relatively euvolemic on today's visit. Takes Lasix 80 mg every other day Leg edema likely from lymphedema  Type 2 diabetes mellitus with chronic kidney disease, with long-term current use of insulin, unspecified CKD stage ([place]) We have encouraged continued exercise, careful diet management in an effort to lose weight.  Chronic kidney disease, stage III (moderate) Followed by Dr. Holley Raring  PULMONARY FIBROSIS Stable on chronic oxygen 2 L  Disposition:   F/U  6 months   Total encounter time more than 15 minutes   Greater than 50% was spent in counseling and coordination of care with the patient   Signed, Esmond Plants, M.D., Ph.D. 12/14/2015  [place], [place]

## 2015-12-24 DIAGNOSIS — J982 Interstitial emphysema: Secondary | ICD-10-CM | POA: Diagnosis not present

## 2015-12-28 ENCOUNTER — Other Ambulatory Visit: Payer: Self-pay

## 2015-12-28 NOTE — Telephone Encounter (Signed)
Pt left v/m requesting rx oxycodone. Call when ready for pick up. Pt last seen and rx last printed # 30 on 11/25/15.

## 2015-12-29 MED ORDER — OXYCODONE HCL 15 MG PO TABS: 15.0000 mg | ORAL_TABLET | Freq: Every evening | ORAL | Status: DC

## 2015-12-29 NOTE — Telephone Encounter (Signed)
Printed. Okay to pick up after I sign later today.  Thanks.

## 2015-12-29 NOTE — Telephone Encounter (Signed)
Pt left v/m requesting status of oxycodone rx.

## 2015-12-29 NOTE — Telephone Encounter (Signed)
Patient notified by telephone that script is up front ready for pickup. 

## 2016-01-06 DIAGNOSIS — J449 Chronic obstructive pulmonary disease, unspecified: Secondary | ICD-10-CM | POA: Diagnosis not present

## 2016-01-23 DIAGNOSIS — J982 Interstitial emphysema: Secondary | ICD-10-CM | POA: Diagnosis not present

## 2016-01-26 ENCOUNTER — Other Ambulatory Visit: Payer: Self-pay

## 2016-01-26 MED ORDER — OXYCODONE HCL 15 MG PO TABS: 15.0000 mg | ORAL_TABLET | Freq: Every evening | ORAL | 0 refills | Status: DC

## 2016-01-26 NOTE — Telephone Encounter (Signed)
Printed.  Thanks.  

## 2016-01-26 NOTE — Telephone Encounter (Signed)
Patient notified by telephone that script is up front ready for pickup. 

## 2016-01-26 NOTE — Telephone Encounter (Signed)
Pt left vm requesting rx oxycodone . Call when ready for pick up . Last printed # 30 on 12/29/15. Last seen 11/25/15.

## 2016-02-03 ENCOUNTER — Other Ambulatory Visit: Payer: Self-pay | Admitting: Family Medicine

## 2016-02-06 DIAGNOSIS — J449 Chronic obstructive pulmonary disease, unspecified: Secondary | ICD-10-CM | POA: Diagnosis not present

## 2016-02-08 DIAGNOSIS — I701 Atherosclerosis of renal artery: Secondary | ICD-10-CM | POA: Diagnosis not present

## 2016-02-08 DIAGNOSIS — I10 Essential (primary) hypertension: Secondary | ICD-10-CM | POA: Diagnosis not present

## 2016-02-08 DIAGNOSIS — R809 Proteinuria, unspecified: Secondary | ICD-10-CM | POA: Diagnosis not present

## 2016-02-08 DIAGNOSIS — N183 Chronic kidney disease, stage 3 (moderate): Secondary | ICD-10-CM | POA: Diagnosis not present

## 2016-02-08 DIAGNOSIS — R600 Localized edema: Secondary | ICD-10-CM | POA: Diagnosis not present

## 2016-02-13 ENCOUNTER — Telehealth: Payer: Self-pay | Admitting: Family Medicine

## 2016-02-13 NOTE — Telephone Encounter (Signed)
I think this is Dr Josefine Class pt

## 2016-02-13 NOTE — Telephone Encounter (Signed)
-----   Message from Ellamae Sia sent at 02/11/2016 10:44 AM EDT ----- Regarding: Lab orders for Friday, 8.18.17 Lab orders for a 3 month follow up appt.

## 2016-02-17 ENCOUNTER — Other Ambulatory Visit: Payer: Self-pay | Admitting: Family Medicine

## 2016-02-17 DIAGNOSIS — Z794 Long term (current) use of insulin: Secondary | ICD-10-CM

## 2016-02-17 DIAGNOSIS — E1122 Type 2 diabetes mellitus with diabetic chronic kidney disease: Secondary | ICD-10-CM

## 2016-02-18 ENCOUNTER — Other Ambulatory Visit (INDEPENDENT_AMBULATORY_CARE_PROVIDER_SITE_OTHER): Payer: Commercial Managed Care - HMO

## 2016-02-18 DIAGNOSIS — Z794 Long term (current) use of insulin: Secondary | ICD-10-CM

## 2016-02-18 DIAGNOSIS — E1122 Type 2 diabetes mellitus with diabetic chronic kidney disease: Secondary | ICD-10-CM | POA: Diagnosis not present

## 2016-02-18 DIAGNOSIS — I701 Atherosclerosis of renal artery: Secondary | ICD-10-CM | POA: Diagnosis not present

## 2016-02-18 DIAGNOSIS — N189 Chronic kidney disease, unspecified: Secondary | ICD-10-CM | POA: Diagnosis not present

## 2016-02-18 DIAGNOSIS — I150 Renovascular hypertension: Secondary | ICD-10-CM | POA: Diagnosis not present

## 2016-02-18 LAB — COMPREHENSIVE METABOLIC PANEL
ALT: 21 U/L (ref 0–53)
AST: 18 U/L (ref 0–37)
Albumin: 3.2 g/dL — ABNORMAL LOW (ref 3.5–5.2)
Alkaline Phosphatase: 68 U/L (ref 39–117)
BUN: 32 mg/dL — ABNORMAL HIGH (ref 6–23)
CO2: 31 mEq/L (ref 19–32)
Calcium: 9 mg/dL (ref 8.4–10.5)
Chloride: 104 mEq/L (ref 96–112)
Creatinine, Ser: 2.01 mg/dL — ABNORMAL HIGH (ref 0.40–1.50)
GFR: 35.29 mL/min — ABNORMAL LOW (ref >60.00)
Glucose, Bld: 45 mg/dL — CL (ref 70–99)
Potassium: 3.7 mEq/L (ref 3.5–5.1)
Sodium: 141 mEq/L (ref 135–145)
Total Bilirubin: 0.5 mg/dL (ref 0.2–1.2)
Total Protein: 6.4 g/dL (ref 6.0–8.3)

## 2016-02-18 LAB — LIPID PANEL
Cholesterol: 122 mg/dL (ref 0–200)
HDL: 27.2 mg/dL — ABNORMAL LOW (ref >39.00)
LDL Cholesterol: 75 mg/dL (ref 0–99)
NonHDL: 95.18
Total CHOL/HDL Ratio: 4
Triglycerides: 102 mg/dL (ref 0.0–149.0)
VLDL: 20.4 mg/dL (ref 0.0–40.0)

## 2016-02-18 LAB — HEMOGLOBIN A1C: Hgb A1c MFr Bld: 8.4 % — ABNORMAL HIGH (ref 4.6–6.5)

## 2016-02-23 DIAGNOSIS — J982 Interstitial emphysema: Secondary | ICD-10-CM | POA: Diagnosis not present

## 2016-02-25 ENCOUNTER — Ambulatory Visit: Payer: Commercial Managed Care - HMO | Admitting: Family Medicine

## 2016-03-01 DIAGNOSIS — N183 Chronic kidney disease, stage 3 (moderate): Secondary | ICD-10-CM | POA: Diagnosis not present

## 2016-03-01 DIAGNOSIS — I701 Atherosclerosis of renal artery: Secondary | ICD-10-CM | POA: Diagnosis not present

## 2016-03-01 DIAGNOSIS — E1122 Type 2 diabetes mellitus with diabetic chronic kidney disease: Secondary | ICD-10-CM | POA: Diagnosis not present

## 2016-03-01 DIAGNOSIS — I10 Essential (primary) hypertension: Secondary | ICD-10-CM | POA: Diagnosis not present

## 2016-03-01 DIAGNOSIS — R600 Localized edema: Secondary | ICD-10-CM | POA: Diagnosis not present

## 2016-03-02 ENCOUNTER — Ambulatory Visit (INDEPENDENT_AMBULATORY_CARE_PROVIDER_SITE_OTHER): Payer: Commercial Managed Care - HMO | Admitting: Family Medicine

## 2016-03-02 ENCOUNTER — Other Ambulatory Visit: Payer: Self-pay | Admitting: *Deleted

## 2016-03-02 ENCOUNTER — Encounter: Payer: Self-pay | Admitting: Family Medicine

## 2016-03-02 VITALS — BP 140/68 | HR 63 | Temp 98.5°F | Wt 260.8 lb

## 2016-03-02 DIAGNOSIS — N183 Chronic kidney disease, stage 3 unspecified: Secondary | ICD-10-CM

## 2016-03-02 DIAGNOSIS — R609 Edema, unspecified: Secondary | ICD-10-CM | POA: Diagnosis not present

## 2016-03-02 DIAGNOSIS — E1122 Type 2 diabetes mellitus with diabetic chronic kidney disease: Secondary | ICD-10-CM

## 2016-03-02 DIAGNOSIS — M5442 Lumbago with sciatica, left side: Secondary | ICD-10-CM

## 2016-03-02 DIAGNOSIS — Z794 Long term (current) use of insulin: Secondary | ICD-10-CM

## 2016-03-02 DIAGNOSIS — Z23 Encounter for immunization: Secondary | ICD-10-CM | POA: Diagnosis not present

## 2016-03-02 MED ORDER — OXYCODONE HCL 15 MG PO TABS: 15.0000 mg | ORAL_TABLET | Freq: Every evening | ORAL | 0 refills | Status: DC

## 2016-03-02 MED ORDER — ACCU-CHEK AVIVA PLUS W/DEVICE KIT: PACK | 0 refills | Status: DC

## 2016-03-02 MED ORDER — GLUCOSE BLOOD VI STRP: ORAL_STRIP | 3 refills | Status: DC

## 2016-03-02 MED ORDER — ACCU-CHEK FASTCLIX LANCETS MISC: 3 refills | Status: DC

## 2016-03-02 NOTE — Progress Notes (Signed)
Pre visit review using our clinic review tool, if applicable. No additional management support is needed unless otherwise documented below in the visit note. 

## 2016-03-02 NOTE — Assessment & Plan Note (Signed)
At baseline. He would be a poor surgical candidate. Discussed with patient. No adverse effect on medicine. He does get some relief with oxycodone. Continue as is. Prescription printed and given to patient at office visit.

## 2016-03-02 NOTE — Assessment & Plan Note (Signed)
This is an ongoing issue for patient. No skin breakdown on the lower legs.

## 2016-03-02 NOTE — Progress Notes (Signed)
His father in law recently died after a prolonged illness.  I offered my condolences.  He is still helping care for his mother in law.  His wife recently had appendectomy.   His grandson had to have repeat heart surgery.    Diabetes:  Using medications without difficulties:yes Hypoglycemic episodes:yes, see prev notes on labs Hyperglycemic episodes:no Feet problems:no Blood Sugars averaging: 130s or lower usually eye exam within last year: due, d/w pt.  He needs meter, strips, and lancets sent to [facility].  Accu check.   A1c improved.  D/w pt about lowering PM dose of insulin if he has low sugars again.  The prev episode was related to prolonged fasting and that was atypical for him.    CKD.  Cr at baseline, at ~2.  D/w pt.   Flu shot done at [place].    Back pain continues.  R shoulder pain continues.  On opiates at baseline w/o ADE.  Still using heating pad as much as possible.  He has noted pathology in L spine and also R shoulder prev imaging that was done at [facility]    Meds, vitals, and allergies reviewed.   ROS: Per HPI unless specifically indicated in ROS section   GEN: nad, alert and oriented, still on O2 at baseline.  HEENT: mucous membranes moist NECK: supple w/o LA CV: rrr. PULM: ctab, no inc wob ABD: soft, +bs EXT: 1-2+ BLE edema SKIN: no acute rash

## 2016-03-02 NOTE — Assessment & Plan Note (Addendum)
He had a previous low sugar noted, but that was when he had extended fasting. I told him about lowering his insulin dose if he has any more low sugars. He agrees. It is likely that if we push his insulin dose much higher, he would have inappropriately low sugars, and this would be counterproductive. Discussed with patient. Goal A1c would likely be around 8. Labs discussed with patient. Offered my condolences about all of the upheaval recently. Fortunately his wife is doing better.  >25 minutes spent in face to face time with patient, >50% spent in counselling or coordination of care.

## 2016-03-02 NOTE — Assessment & Plan Note (Signed)
Cr at baseline, at ~2.  D/w pt.

## 2016-03-02 NOTE — Patient Instructions (Signed)
Recheck A1c in about 6 months before a visit.  Take care.  Glad to see you.  Update me as needed.  If needed, cut back on your insulin, if you have lows.

## 2016-03-08 DIAGNOSIS — J449 Chronic obstructive pulmonary disease, unspecified: Secondary | ICD-10-CM | POA: Diagnosis not present

## 2016-03-09 ENCOUNTER — Other Ambulatory Visit: Payer: Self-pay | Admitting: *Deleted

## 2016-03-09 MED ORDER — ACCU-CHEK SOFTCLIX LANCETS MISC: 5 refills | Status: DC

## 2016-03-14 ENCOUNTER — Telehealth: Payer: Self-pay | Admitting: *Deleted

## 2016-03-14 NOTE — Telephone Encounter (Signed)
Received faxed form from [facility] requesting clarification on diabetic supplies. Form is on your desk.

## 2016-03-15 NOTE — Telephone Encounter (Signed)
I'll work on hard copy. Thanks.

## 2016-03-17 NOTE — Telephone Encounter (Signed)
Faxed.  New Rx's were sent electronically last week as well.

## 2016-03-25 DIAGNOSIS — J982 Interstitial emphysema: Secondary | ICD-10-CM | POA: Diagnosis not present

## 2016-04-03 ENCOUNTER — Other Ambulatory Visit: Payer: Self-pay

## 2016-04-03 NOTE — Telephone Encounter (Signed)
Last filled on 03/02/16, last OV 03/02/16. Ok to refill?

## 2016-04-04 MED ORDER — OXYCODONE HCL 15 MG PO TABS: 15.0000 mg | ORAL_TABLET | Freq: Every evening | ORAL | 0 refills | Status: DC

## 2016-04-04 NOTE — Telephone Encounter (Signed)
Patient advised.  Rx left at front desk for pick up. 

## 2016-04-04 NOTE — Telephone Encounter (Signed)
Printed.  Thanks.  

## 2016-04-07 DIAGNOSIS — J449 Chronic obstructive pulmonary disease, unspecified: Secondary | ICD-10-CM | POA: Diagnosis not present

## 2016-04-24 DIAGNOSIS — J982 Interstitial emphysema: Secondary | ICD-10-CM | POA: Diagnosis not present

## 2016-05-01 ENCOUNTER — Encounter: Payer: Self-pay | Admitting: Family Medicine

## 2016-05-01 ENCOUNTER — Ambulatory Visit (INDEPENDENT_AMBULATORY_CARE_PROVIDER_SITE_OTHER): Payer: Commercial Managed Care - HMO | Admitting: Family Medicine

## 2016-05-01 DIAGNOSIS — M5442 Lumbago with sciatica, left side: Secondary | ICD-10-CM

## 2016-05-01 DIAGNOSIS — G8929 Other chronic pain: Secondary | ICD-10-CM | POA: Diagnosis not present

## 2016-05-01 DIAGNOSIS — J0110 Acute frontal sinusitis, unspecified: Secondary | ICD-10-CM | POA: Diagnosis not present

## 2016-05-01 MED ORDER — DOXYCYCLINE HYCLATE 100 MG PO TABS: 100.0000 mg | ORAL_TABLET | Freq: Two times a day (BID) | ORAL | 0 refills | Status: DC

## 2016-05-01 MED ORDER — OXYCODONE HCL 15 MG PO TABS: 15.0000 mg | ORAL_TABLET | Freq: Every evening | ORAL | 0 refills | Status: DC | PRN

## 2016-05-01 MED ORDER — OXYCODONE HCL 15 MG PO TABS: 15.0000 mg | ORAL_TABLET | Freq: Every evening | ORAL | 0 refills | Status: DC

## 2016-05-01 NOTE — Progress Notes (Signed)
He needed refill on pain medicine for his back, no ADE on med.  Refill done x3 at [facility], given to patient.    Sick for several weeks.  Using nasal saline.  Nose if stuffy to the point that he can't use O2 via [place].  Now with brown sputum.  No fevers but had chills.  No ear pain but ears are stuffy.  Facial pain, along the forehead B.  No ST.  No chest pain.  No vomiting, no diarrhea.    Meds, vitals, and allergies reviewed.   ROS: Per HPI unless specifically indicated in ROS section   GEN: nad, alert and oriented HEENT: mucous membranes moist, tm w/o erythema, nasal exam w/o erythema, clear discharge noted,  OP with cobblestoning, frontal sinuses ttp  NECK: supple w/o LA CV: rrr.   PULM: no focal dec in BS, no wheeze, no inc wob

## 2016-05-01 NOTE — Progress Notes (Signed)
Pre visit review using our clinic review tool, if applicable. No additional management support is needed unless otherwise documented below in the visit note. 

## 2016-05-01 NOTE — Patient Instructions (Signed)
Start doxycycline and try to get some rest in the meantime.  Take care.  Glad to see you.

## 2016-05-02 DIAGNOSIS — J0100 Acute maxillary sinusitis, unspecified: Secondary | ICD-10-CM | POA: Insufficient documentation

## 2016-05-02 NOTE — Assessment & Plan Note (Signed)
Continue meds as is.  He agrees.  No ADE on med.

## 2016-05-02 NOTE — Assessment & Plan Note (Addendum)
Given the duration and his history would start doxycycline.  Still okay for outpatient follow-up. Nontoxic.

## 2016-05-08 DIAGNOSIS — J449 Chronic obstructive pulmonary disease, unspecified: Secondary | ICD-10-CM | POA: Diagnosis not present

## 2016-05-12 ENCOUNTER — Other Ambulatory Visit: Payer: Self-pay | Admitting: Family Medicine

## 2016-05-12 MED ORDER — DOXYCYCLINE HYCLATE 100 MG PO TABS: 100.0000 mg | ORAL_TABLET | Freq: Two times a day (BID) | ORAL | 0 refills | Status: DC

## 2016-05-12 NOTE — Telephone Encounter (Signed)
Pt called. Was seen 10/30 by Dr. [name]. Pt is requesting refill on abx that was prescribed. Pt misplaced bottle to give me name.  Sxs are same as 05/01/16 visit (head stopped up/nose running)  [facility] in [place]  Pt cb # [phone]

## 2016-05-12 NOTE — Telephone Encounter (Signed)
Sent doxy.  If not better, then let me know.  Thanks.

## 2016-05-12 NOTE — Telephone Encounter (Signed)
Pt notified Rx sent and to f/u if no improvement  °

## 2016-05-24 ENCOUNTER — Encounter: Payer: Self-pay | Admitting: Family Medicine

## 2016-05-24 ENCOUNTER — Ambulatory Visit (INDEPENDENT_AMBULATORY_CARE_PROVIDER_SITE_OTHER): Payer: Commercial Managed Care - HMO | Admitting: Family Medicine

## 2016-05-24 DIAGNOSIS — J0100 Acute maxillary sinusitis, unspecified: Secondary | ICD-10-CM | POA: Diagnosis not present

## 2016-05-24 MED ORDER — AMOXICILLIN-POT CLAVULANATE 875-125 MG PO TABS: 1.0000 | ORAL_TABLET | Freq: Two times a day (BID) | ORAL | 0 refills | Status: DC

## 2016-05-24 NOTE — Progress Notes (Signed)
URI sx.  Still on O2 at baseline.  Sx continued for about 6 weeks.  Stuffy, but no brown sputum now.  Still having chills, no fevers.  He doesn't usually have chills at baseline.  Ears are stuffy.  No vomiting, no diarrhea.  Minimal cough.  URI sx more than chest sx.    Meds, vitals, and allergies reviewed.   ROS: Per HPI unless specifically indicated in ROS section   GEN: nad, alert and oriented, on n/c O2.  HEENT: mucous membranes moist, tm w/o erythema, nasal exam w/o erythema, clear discharge noted,  OP with cobblestoning, R max sinus ttp NECK: supple w/o LA CV: rrr.   PULM: ctab, no inc wob

## 2016-05-24 NOTE — Progress Notes (Signed)
Pre visit review using our clinic review tool, if applicable. No additional management support is needed unless otherwise documented below in the visit note. 

## 2016-05-24 NOTE — Patient Instructions (Signed)
I would start back on augmentin.  That should help.  Try using nasal saline in the meantime, if possible.  Take care.  Glad to see you.

## 2016-05-24 NOTE — Assessment & Plan Note (Signed)
Nontoxic. Lungs are clear. Okay for outpatient follow-up. Change to Augmentin. Use nasal saline. Update me as needed. He agrees.

## 2016-05-25 DIAGNOSIS — J982 Interstitial emphysema: Secondary | ICD-10-CM | POA: Diagnosis not present

## 2016-06-06 ENCOUNTER — Ambulatory Visit (INDEPENDENT_AMBULATORY_CARE_PROVIDER_SITE_OTHER): Payer: Commercial Managed Care - HMO | Admitting: Cardiovascular Disease

## 2016-06-06 ENCOUNTER — Encounter: Payer: Self-pay | Admitting: Cardiovascular Disease

## 2016-06-06 VITALS — BP 160/64 | HR 56 | Ht 65.0 in | Wt 269.8 lb

## 2016-06-06 DIAGNOSIS — I5032 Chronic diastolic (congestive) heart failure: Secondary | ICD-10-CM

## 2016-06-06 DIAGNOSIS — I2510 Atherosclerotic heart disease of native coronary artery without angina pectoris: Secondary | ICD-10-CM

## 2016-06-06 DIAGNOSIS — E782 Mixed hyperlipidemia: Secondary | ICD-10-CM | POA: Diagnosis not present

## 2016-06-06 DIAGNOSIS — N183 Chronic kidney disease, stage 3 unspecified: Secondary | ICD-10-CM

## 2016-06-06 DIAGNOSIS — I701 Atherosclerosis of renal artery: Secondary | ICD-10-CM

## 2016-06-06 DIAGNOSIS — J321 Chronic frontal sinusitis: Secondary | ICD-10-CM

## 2016-06-06 DIAGNOSIS — Z794 Long term (current) use of insulin: Secondary | ICD-10-CM

## 2016-06-06 DIAGNOSIS — R609 Edema, unspecified: Secondary | ICD-10-CM

## 2016-06-06 DIAGNOSIS — J8410 Pulmonary fibrosis, unspecified: Secondary | ICD-10-CM

## 2016-06-06 DIAGNOSIS — I10 Essential (primary) hypertension: Secondary | ICD-10-CM

## 2016-06-06 DIAGNOSIS — E1122 Type 2 diabetes mellitus with diabetic chronic kidney disease: Secondary | ICD-10-CM

## 2016-06-06 MED ORDER — HYDRALAZINE HCL 50 MG PO TABS: 50.0000 mg | ORAL_TABLET | Freq: Three times a day (TID) | ORAL | 4 refills | Status: DC

## 2016-06-06 NOTE — Progress Notes (Addendum)
Cardiology Office Note  Date:  06/06/2016   ID:  Stephen [name], Stephen [name] 04-12-1946, MRN [id number]  PCP:  Elsie Stain, MD   Chief Complaint  Patient presents with  . other    6 month f/u c/o sinus infection. Meds reviewed verbally with pt.    HPI:  Mr. Tasker is a very pleasant 70 year old gentleman with a history of obesity,coronary artery disease, PCI to the mid RCA and mid left circumflex in October 2007, interstitial fibrosis on chronic oxygen, with wedge resection, hypertension, hyperlipidemia, remote tobacco abuse, chronic substernal chest pain who presents for routine followup of his coronary artery disease History of diabetes Open lung biopsy was performed November 2009 for bilateral pulmonary infiltrates. Status post stent to his left renal artery for 80+ percent stenosis several weeks ago, performed by Dr. Lucky Cowboy. Also had laser treatment of the lower extremity veins for leg swelling/venous insufficiency  Sinus infection,  3 rounds of ABX Followed by Dr. [name] Recently on Amox, Did not help  Chest pain sat and Sunday one month ago, Took NTG and went away Was sitting at the time in church  Does not check pressures  continued leg swelling right greater than left, worse recently, followed by Dr. Lucky Cowboy Struggling with his sugars, hemoglobin A1c 8.4.  total cholesterol 122, LDL 75 Creatinine 2.10, follow-up with Dr. Holley Raring  Denies any significant chest pain on exertion Reports his breathing status is stable, 1-2 L nasal cannula oxygen Stress at home, living with the in-laws  EKG on todays visit with NSR with old anterior MI, LAD  Other past medical history  Stress test July 2011 was dobutamine stress that showed no significant ischemia, inadequate heart rate was achieved. right and left heart catheterization January 2011 showed 50% mid LAD disease, 60% at the ostium of the diagonal #2, 30% proximal left circumflex disease, patent stent in the left circumflex, patent  stent of the mid RCA, wedge pressure of 16, PA pressure mean 22, right ventricular pressure 41/8 .  echocardiogram September 2010 shows normal systolic function, diastolic dysfunction, normal RV function unable to evaluate right ventricular systolic pressures  PMH:   has a past medical history of Adrenal gland anomaly; Arthritis; CAD (coronary artery disease); CHF (congestive heart failure) ([place]); Colon polyps; Diabetes mellitus with neuropathy ([place]); DM2 (diabetes mellitus, type 2) ([place]); Dyslipidemia; GERD (gastroesophageal reflux disease); Heart disease; High cholesterol; HTN (hypertension); Kidney disease; Kidney stones; Metabolic syndrome; OSA (obstructive sleep apnea); Pulmonary fibrosis ([place]); Recurrent UTI; Renal artery stenosis ([place]); Rotator cuff disorder; Skin cancer; and Thyroid disorder.  PSH:    Past Surgical History:  Procedure Laterality Date  . adrenal adenoma removal  1990-   right  . CARPAL TUNNEL RELEASE     bilateral  . CORONARY ANGIOPLASTY WITH STENT PLACEMENT  2007   x 2  . KNEE SURGERY     right  . LUNG BIOPSY    . RENAL ARTERY STENT  2015   L  . SKIN CANCER EXCISION     back of head    Current Outpatient Prescriptions  Medication Sig Dispense Refill  . ACCU-CHEK SOFTCLIX LANCETS lancets Use as instructed to check blood sugar three times daily and as needed.  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 100 each 5  . aspirin 81 MG tablet Take 81 mg by mouth 2 (two) times daily.      Marland Kitchen atorvastatin (LIPITOR) 80 MG tablet TAKE 1 TABLET AT BEDTIME 90 tablet 2  . Blood Glucose Monitoring Suppl (ACCU-CHEK AVIVA PLUS)  w/Device KIT Use to check blood sugar three times daily and as needed.  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 carvedilol (COREG) 25 MG tablet TAKE 1 TABLET TWICE DAILY WITH A MEAL 180 tablet 2  . cloNIDine (CATAPRES) 0.3 MG tablet TAKE 1 TABLET THREE TIMES DAILY 270 tablet 3  . clopidogrel (PLAVIX) 75 MG tablet TAKE 1 TABLET EVERY DAY 90 tablet 3   . finasteride (PROSCAR) 5 MG tablet Once daily    . furosemide (LASIX) 80 MG tablet TAKE 1 TABLET EVERY OTHER DAY 45 tablet 2  . gabapentin (NEURONTIN) 800 MG tablet TAKE 1 TABLET IN THE MORNING  AND TAKE 1 AND 1/2 TABLETS AT BEDTIME 225 tablet 3  . glucose blood (ACCU-CHEK AVIVA PLUS) test strip Use as instructed to check blood sugar 3 times daily or as needed.  Diagnosis:  E11.22  Insulin-dependent. 300 each 3  . hydrALAZINE (APRESOLINE) 50 MG tablet Take 1 tablet (50 mg total) by mouth 3 (three) times daily. 270 tablet 4  . insulin NPH-regular Human (NOVOLIN 70/30) (70-30) 100 UNIT/ML injection INJECT  70 UNITS SUBCUTANEOUSLY EVERY MORNING  AND  35-45 UNITS IN THE EVENING 10 vial 1  . Insulin Syringe-Needle U-100 (INSULIN SYRINGE 1CC/30GX5/16") 30G X 5/16" 1 ML MISC Use as directed to take insulin Dx 250.62 180 each 1  . isosorbide mononitrate (IMDUR) 60 MG 24 hr tablet TAKE 1 TABLET TWICE DAILY 180 tablet 3  . nitroGLYCERIN (NITROSTAT) 0.4 MG SL tablet Place 1 tablet (0.4 mg total) under the tongue every 5 (five) minutes as needed. May repeat x3 25 tablet prn  . NON FORMULARY Oxygen 2 liters 24/7    . omeprazole (PRILOSEC) 20 MG capsule Take 20 mg by mouth daily.      Marland Kitchen oxyCODONE (ROXICODONE) 15 MG immediate release tablet Take 1 tablet (15 mg total) by mouth every evening. 30 tablet 0  . triamterene-hydrochlorothiazide (MAXZIDE-25) 37.5-25 MG tablet Take 1 tablet by mouth daily. 90 tablet 3   No current facility-administered medications for this visit.      Allergies:   Calcium channel blockers   Social History:  The patient  reports that he has never smoked. He has never used smokeless tobacco. He reports that he does not drink alcohol or use drugs.   Family History:   family history includes Arthritis in his maternal grandmother; Breast cancer in his maternal aunt; COPD in his sister; Diabetes in his maternal aunt; Heart attack in his brother, father, and mother; Hypertension in his  maternal aunt and other.    Review of Systems: Review of Systems  Constitutional: Negative.   HENT: Positive for congestion.   Respiratory: Positive for shortness of breath.   Cardiovascular: Positive for leg swelling.  Gastrointestinal: Negative.   Musculoskeletal: Negative.   Neurological: Negative.   Psychiatric/Behavioral: Negative.   All other systems reviewed and are negative.    PHYSICAL EXAM: VS:  BP (!) 160/64 (BP Location: Left Arm, Patient Position: Sitting, Cuff Size: Large)   Pulse (!) 56   Ht _0  (1.651 m)   Wt 269 lb 12 oz (122.4 kg)   BMI 44.89 kg/m  , BMI Body mass index is 44.89 kg/m. GEN: Well nourished, well developed, in no acute distress  HEENT: normal  Neck: no JVD, carotid bruits, or masses Cardiac: RRR; no murmurs, rubs, or gallops,R?L nonpitting  edema  Respiratory:  clear to auscultation bilaterally, normal work of breathing GI: soft, nontender, nondistended, + BS MS: no deformity  or atrophy  Skin: warm and dry, no rash Neuro:  Strength and sensation are intact Psych: euthymic mood, full affect    Recent Labs: 02/18/2016: ALT 21; BUN 32; Creatinine, Ser 2.01; Potassium 3.7; Sodium 141    Lipid Panel Lab Results  Component Value Date   CHOL 122 02/18/2016   HDL 27.20 (L) 02/18/2016   LDLCALC 75 02/18/2016   TRIG 102.0 02/18/2016      Wt Readings from Last 3 Encounters:  06/06/16 269 lb 12 oz (122.4 kg)  05/24/16 269 lb 6.4 oz (122.2 kg)  05/01/16 265 lb 8 oz (120.4 kg)       ASSESSMENT AND PLAN:  Atherosclerosis of native coronary artery of native heart without angina pectoris - Plan: EKG 12-Lead Currently with no symptoms of angina. No further workup at this time. Continue current medication regimen.  Essential hypertension, benign - Plan: EKG 12-Lead We will increase the hydralazine up to 50 mg three times a day  Chronic diastolic CHF (congestive heart failure) (HCC) - Plan: EKG 12-Lead Appears euvolemic, Stay on HCTZ,  lasix  Mixed hyperlipidemia Cholesterol is at goal on the current lipid regimen. No changes to the medications were made.  Renal artery stenosis, native ([place]) folloowed by Dr. Lucky Cowboy Chronic edema  Morbid obesity [facility]) We have encouraged continued exercise, careful diet management in an effort to lose weight.  PULMONARY FIBROSIS On and off oxygen  Type 2 diabetes mellitus with chronic kidney disease, with long-term current use of insulin, unspecified CKD stage (HCC) Uncontrolled, Discussed diet, Weight down  Chronic kidney disease, stage III (moderate) Followed by Dr. Holley Raring  Chronic frontal sinusitis Try sudafed, Finished ABX x 3   Total encounter time more than 25 minutes  Greater than 50% was spent in counseling and coordination of care with the patient   Disposition:   F/U  6 months   Orders Placed This Encounter  Procedures  . EKG 12-Lead     Signed, Esmond Plants, M.D., Ph.D. 06/06/2016  [place], [place]

## 2016-06-06 NOTE — Patient Instructions (Addendum)
Medication Instructions:   Please increase the hydralazine up to 50 mg three times a day Monitor blood pressure  Try sudafed for sinus congestion  Labwork:  No new labs needed  Testing/Procedures:  No further testing at this time   I recommend watching educational videos on topics of interest to you at:       [email or web address]  Enter code: [email or web address]    Follow-Up: It was a pleasure seeing you in the office today. Please call us if you have new issues that need to be addressed before your next appt.  [phone]  Your physician wants you to follow-up in: 6 months.  You will receive a reminder letter in the mail two months in advance. If you don't receive a letter, please call our office to schedule the follow-up appointment.  If you need a refill on your cardiac medications before your next appointment, please call your pharmacy.

## 2016-06-07 DIAGNOSIS — J449 Chronic obstructive pulmonary disease, unspecified: Secondary | ICD-10-CM | POA: Diagnosis not present

## 2016-06-24 DIAGNOSIS — J982 Interstitial emphysema: Secondary | ICD-10-CM | POA: Diagnosis not present

## 2016-06-28 DIAGNOSIS — R600 Localized edema: Secondary | ICD-10-CM | POA: Diagnosis not present

## 2016-06-28 DIAGNOSIS — N183 Chronic kidney disease, stage 3 (moderate): Secondary | ICD-10-CM | POA: Diagnosis not present

## 2016-06-28 DIAGNOSIS — E1122 Type 2 diabetes mellitus with diabetic chronic kidney disease: Secondary | ICD-10-CM | POA: Diagnosis not present

## 2016-06-28 DIAGNOSIS — I10 Essential (primary) hypertension: Secondary | ICD-10-CM | POA: Diagnosis not present

## 2016-07-08 DIAGNOSIS — J449 Chronic obstructive pulmonary disease, unspecified: Secondary | ICD-10-CM | POA: Diagnosis not present

## 2016-07-24 ENCOUNTER — Telehealth: Payer: Self-pay | Admitting: Family Medicine

## 2016-07-24 DIAGNOSIS — J329 Chronic sinusitis, unspecified: Secondary | ICD-10-CM

## 2016-07-24 NOTE — Telephone Encounter (Signed)
ordered. Thanks. 

## 2016-07-24 NOTE — Telephone Encounter (Signed)
Spoke to patient and was advised that he does want a referral to an ENT. Advised patient that he will hear back from one of the referral coordinators to get this set up for him.

## 2016-07-24 NOTE — Telephone Encounter (Signed)
Wife mentioned that he was still having sinus troubles.  Will he go to ENT if I put in the order?  Let me know.  Thanks.

## 2016-07-25 DIAGNOSIS — J982 Interstitial emphysema: Secondary | ICD-10-CM | POA: Diagnosis not present

## 2016-08-02 ENCOUNTER — Encounter: Payer: Self-pay | Admitting: Family Medicine

## 2016-08-07 ENCOUNTER — Other Ambulatory Visit: Payer: Self-pay | Admitting: Cardiovascular Disease

## 2016-08-07 MED ORDER — CLOPIDOGREL BISULFATE 75 MG PO TABS: 75.0000 mg | ORAL_TABLET | Freq: Every day | ORAL | 3 refills | Status: DC

## 2016-08-07 NOTE — Telephone Encounter (Signed)
Pt wants rx sent to local pharmacy until [facility] can fille

## 2016-08-07 NOTE — Telephone Encounter (Signed)
Requested Prescriptions   Signed Prescriptions Disp Refills  . clopidogrel (PLAVIX) 75 MG tablet 30 tablet 3    Sig: Take 1 tablet (75 mg total) by mouth daily.    Authorizing Provider: Minna Merritts    Ordering User: Britt Bottom

## 2016-08-08 DIAGNOSIS — J449 Chronic obstructive pulmonary disease, unspecified: Secondary | ICD-10-CM | POA: Diagnosis not present

## 2016-08-14 DIAGNOSIS — J3489 Other specified disorders of nose and nasal sinuses: Secondary | ICD-10-CM | POA: Diagnosis not present

## 2016-08-14 DIAGNOSIS — E109 Type 1 diabetes mellitus without complications: Secondary | ICD-10-CM | POA: Diagnosis not present

## 2016-08-14 DIAGNOSIS — J309 Allergic rhinitis, unspecified: Secondary | ICD-10-CM | POA: Diagnosis not present

## 2016-08-14 DIAGNOSIS — J342 Deviated nasal septum: Secondary | ICD-10-CM | POA: Diagnosis not present

## 2016-08-25 ENCOUNTER — Encounter (INDEPENDENT_AMBULATORY_CARE_PROVIDER_SITE_OTHER): Payer: Medicare HMO

## 2016-08-25 ENCOUNTER — Ambulatory Visit (INDEPENDENT_AMBULATORY_CARE_PROVIDER_SITE_OTHER): Payer: Self-pay | Admitting: Vascular Surgery

## 2016-08-25 DIAGNOSIS — J982 Interstitial emphysema: Secondary | ICD-10-CM | POA: Diagnosis not present

## 2016-08-28 DIAGNOSIS — J301 Allergic rhinitis due to pollen: Secondary | ICD-10-CM | POA: Diagnosis not present

## 2016-08-29 ENCOUNTER — Other Ambulatory Visit (INDEPENDENT_AMBULATORY_CARE_PROVIDER_SITE_OTHER): Payer: Medicare HMO

## 2016-08-29 DIAGNOSIS — Z794 Long term (current) use of insulin: Secondary | ICD-10-CM

## 2016-08-29 DIAGNOSIS — E1122 Type 2 diabetes mellitus with diabetic chronic kidney disease: Secondary | ICD-10-CM | POA: Diagnosis not present

## 2016-08-29 LAB — HEMOGLOBIN A1C: Hgb A1c MFr Bld: 8.7 % — ABNORMAL HIGH (ref 4.6–6.5)

## 2016-08-30 ENCOUNTER — Telehealth: Payer: Self-pay

## 2016-08-30 NOTE — Telephone Encounter (Signed)
Attempted to reach pt as he has an appt with PCP on 08/31/16. Phone does not have VM.

## 2016-08-31 ENCOUNTER — Ambulatory Visit (INDEPENDENT_AMBULATORY_CARE_PROVIDER_SITE_OTHER): Payer: Medicare HMO | Admitting: Family Medicine

## 2016-08-31 ENCOUNTER — Encounter: Payer: Self-pay | Admitting: Family Medicine

## 2016-08-31 DIAGNOSIS — E1122 Type 2 diabetes mellitus with diabetic chronic kidney disease: Secondary | ICD-10-CM

## 2016-08-31 DIAGNOSIS — M5441 Lumbago with sciatica, right side: Secondary | ICD-10-CM | POA: Diagnosis not present

## 2016-08-31 DIAGNOSIS — G8929 Other chronic pain: Secondary | ICD-10-CM | POA: Diagnosis not present

## 2016-08-31 DIAGNOSIS — Z794 Long term (current) use of insulin: Secondary | ICD-10-CM | POA: Diagnosis not present

## 2016-08-31 MED ORDER — OXYCODONE HCL 15 MG PO TABS: 15.0000 mg | ORAL_TABLET | Freq: Every evening | ORAL | 0 refills | Status: DC

## 2016-08-31 MED ORDER — OXYCODONE HCL 15 MG PO TABS: 15.0000 mg | ORAL_TABLET | Freq: Every day | ORAL | 0 refills | Status: DC | PRN

## 2016-08-31 NOTE — Progress Notes (Signed)
He was on prednisone due to chronic issues.  That likely affected his A1c, d/w pt. No ADE on meds.  No foot changes.  Due for eye exam, d/w pt.  Sugar elevation likely related to social upheaval at home along with prednisone.  He has had some elevations in spite of low carb meals, while on prednisone.  Was last on prednisone about 1 week ago, prior to A1c.  Mood is better off prednisone.    Sugar is improved off prednisone, back to 98 this AM.  That is more typical for patient.    His mother in law recently died at home.  I offered my condolences.  He had been looking after her at his home.    He has allergy clinic eval pending.  Prednisone helped his nasal congestion.  Per ENT clinic he has tight nasal passage.    He needed refill on pain medicine for his back and L leg pain, no ADE on med.  Refill done x3 at OV, given to patient.  On bowel regimen with prune juice.   ROS: Per HPI unless specifically indicated in ROS section   Meds, vitals, and allergies reviewed.   GEN: nad, alert and oriented HEENT: mucous membranes moist NECK: supple w/o LA CV: rrr.  no murmur PULM: ctab, no inc wob ABD: soft, +bs EXT: 2+ BLE  edema SKIN: no acute rash  Diabetic foot exam: Normal inspection No skin breakdown No calluses  Normal DP pulses Normal sensation to light touch and monofilament Nails normal

## 2016-08-31 NOTE — Assessment & Plan Note (Signed)
A1c up, but sugar improved now off prednisone, d/w pt.   Diet was off with sig social upheaval.  He'll continue meds as is, with work on diet and exercise as tolerated and recheck in about 3 months with plan for him to update me in the meantime if needed.   >25 minutes spent in face to face time with patient, >50% spent in counselling or coordination of care.

## 2016-08-31 NOTE — Patient Instructions (Addendum)
Recheck labs in about 3 months before a visit.   If you have significant sugar elevations in the meantime then let me know.  Take care.  Glad to see you.  Update me as needed.

## 2016-08-31 NOTE — Progress Notes (Signed)
Pre visit review using our clinic review tool, if applicable. No additional management support is needed unless otherwise documented below in the visit note. 

## 2016-08-31 NOTE — Assessment & Plan Note (Signed)
Ongoing, not a good candidate for invasive tx, reasonable to use oxycodone at night for pain relief to allow him to get some rest.  No ADE on med. I don't see other good options given his pain severity, his comorbid conditions, and his response to med w/o ADE.  He agrees.  Refill done x3 at OV, given to patient.  On bowel regimen with prune juice.

## 2016-09-04 ENCOUNTER — Telehealth: Payer: Self-pay | Admitting: Family Medicine

## 2016-09-04 DIAGNOSIS — J309 Allergic rhinitis, unspecified: Secondary | ICD-10-CM | POA: Diagnosis not present

## 2016-09-04 DIAGNOSIS — J3489 Other specified disorders of nose and nasal sinuses: Secondary | ICD-10-CM | POA: Diagnosis not present

## 2016-09-04 NOTE — Telephone Encounter (Signed)
Called pt: no vm set up.Time to schedule AWV + labs with Stephen [name] and CPE with PCP.

## 2016-09-05 DIAGNOSIS — J449 Chronic obstructive pulmonary disease, unspecified: Secondary | ICD-10-CM | POA: Diagnosis not present

## 2016-09-22 DIAGNOSIS — J982 Interstitial emphysema: Secondary | ICD-10-CM | POA: Diagnosis not present

## 2016-10-03 NOTE — Telephone Encounter (Signed)
Called pt. VM not set up. Pt due for AWV and CPE

## 2016-10-06 ENCOUNTER — Encounter: Payer: Self-pay | Admitting: Family Medicine

## 2016-10-06 ENCOUNTER — Other Ambulatory Visit (INDEPENDENT_AMBULATORY_CARE_PROVIDER_SITE_OTHER): Payer: Self-pay | Admitting: Vascular Surgery

## 2016-10-06 ENCOUNTER — Ambulatory Visit (INDEPENDENT_AMBULATORY_CARE_PROVIDER_SITE_OTHER): Payer: Medicare HMO | Admitting: Family Medicine

## 2016-10-06 DIAGNOSIS — R609 Edema, unspecified: Secondary | ICD-10-CM | POA: Diagnosis not present

## 2016-10-06 DIAGNOSIS — I150 Renovascular hypertension: Secondary | ICD-10-CM

## 2016-10-06 DIAGNOSIS — I701 Atherosclerosis of renal artery: Secondary | ICD-10-CM

## 2016-10-06 DIAGNOSIS — J449 Chronic obstructive pulmonary disease, unspecified: Secondary | ICD-10-CM | POA: Diagnosis not present

## 2016-10-06 NOTE — Progress Notes (Signed)
He and his family are adjusting to and working through the death of his parents in [employer].  Discussed with patient.  R shin and calf prev blistered last week.  Weeping clear fluid prev.  No FCNAVD.  History of chronic bilateral lower extremity edema. Still on Lasix at baseline.    Meds, vitals, and allergies reviewed.   ROS: Per HPI unless specifically indicated in ROS section   nad ncat On O2 at baseline rrr ctab, no focal decrease in breath sounds noted. Exam limited by habitus. Ext with 2-3+ BLE edema, pitting, with previous likely resolving blister sites noted on the right shin. No open lesions. All of the likely previous blister sites are less than 1 cm and superficial. None draining. No purulent discharge. Intact dorsalis pedis pulse on the right foot.

## 2016-10-06 NOTE — Progress Notes (Signed)
Pre visit review using our clinic review tool, if applicable. No additional management support is needed unless otherwise documented below in the visit note. 

## 2016-10-06 NOTE — Patient Instructions (Signed)
The spots don't look infected.  Over the weekend, put an ACE wrap on your foot and lower leg, tight enough to squeeze a little but not enough to make your foot/toes hurt.  I'll await the vein clinic notes.  It doesn't look like you need antibiotics at this point.  Take care.  Glad to see you.

## 2016-10-08 NOTE — Assessment & Plan Note (Signed)
D/w pt about options.  I don't want to increase his furosemide given his blood pressure and chronic kidney disease. I can't put an Unna boot on his leg today as his pants would be to tight to accommodate the dressing. He is going to follow-up with the vein clinic on Monday. In the meantime it is reasonable for him to get home and use an Ace wrap from midfoot upward and elevate the leg as much as possible. He does not appear to need antibiotics at this point. Okay for outpatient follow-up. He agrees with plan.

## 2016-10-09 ENCOUNTER — Telehealth: Payer: Self-pay | Admitting: Family Medicine

## 2016-10-09 ENCOUNTER — Encounter (INDEPENDENT_AMBULATORY_CARE_PROVIDER_SITE_OTHER): Payer: Self-pay | Admitting: Vascular Surgery

## 2016-10-09 ENCOUNTER — Ambulatory Visit (INDEPENDENT_AMBULATORY_CARE_PROVIDER_SITE_OTHER): Payer: Medicare HMO | Admitting: Vascular Surgery

## 2016-10-09 ENCOUNTER — Ambulatory Visit (INDEPENDENT_AMBULATORY_CARE_PROVIDER_SITE_OTHER): Payer: Medicare HMO

## 2016-10-09 VITALS — BP 175/84 | HR 67 | Resp 16 | Ht 65.0 in | Wt 273.0 lb

## 2016-10-09 DIAGNOSIS — Z794 Long term (current) use of insulin: Secondary | ICD-10-CM | POA: Diagnosis not present

## 2016-10-09 DIAGNOSIS — I701 Atherosclerosis of renal artery: Secondary | ICD-10-CM | POA: Diagnosis not present

## 2016-10-09 DIAGNOSIS — E1122 Type 2 diabetes mellitus with diabetic chronic kidney disease: Secondary | ICD-10-CM

## 2016-10-09 DIAGNOSIS — I150 Renovascular hypertension: Secondary | ICD-10-CM

## 2016-10-09 DIAGNOSIS — R609 Edema, unspecified: Secondary | ICD-10-CM | POA: Diagnosis not present

## 2016-10-09 NOTE — Telephone Encounter (Signed)
Called pt. VM not set up. Pt due for AWV and CPE

## 2016-10-09 NOTE — Progress Notes (Signed)
Subjective:    Patient ID: Stephen [name], male    DOB: 12-Sep-1945, 71 y.o.   MRN: [id number] Chief Complaint  Patient presents with  . Re-evaluation    6 month renal follow up   The patient presents for a six month renal artery stenosis follow up. The patient is s/p left renal artery stent on 07/21/2013. The patient underwent a renal artery duplex exam which was notable for no significant stenosis.  Patient reports good HTN control and no change from baseline kidney function. He has recently seen Dr. Para March for bilateral chronic lower extremity edema. Recent blistering and weeping noted to right lower extremity. Dr. Para March recommended OTC unna wraps. Patient is not engaging in elevation at this time. No active weeping / ulceration. He has a history of venous ablation to his lower extremity. Denies any fever, nausea or vomiting.     Review of Systems  Constitutional: Negative.   HENT: Negative.   Eyes: Negative.   Respiratory: Negative.   Cardiovascular: Positive for leg swelling.  Gastrointestinal: Negative.   Endocrine: Negative.   Genitourinary: Negative.   Musculoskeletal: Negative.   Skin: Negative.   Allergic/Immunologic: Negative.   Neurological: Negative.   Hematological: Negative.   Psychiatric/Behavioral: Negative.       Objective:   Physical Exam  Constitutional: He is oriented to person, place, and time. He appears well-developed and well-nourished. No distress.  HENT:  Head: Normocephalic and atraumatic.  Eyes: Pupils are equal, round, and reactive to light.  Neck: Normal range of motion.  Cardiovascular: Normal rate, regular rhythm, normal heart sounds and intact distal pulses.   Pulses:      Radial pulses are 2+ on the right side, and 2+ on the left side.  Hard to appreciate pedal pulses due to edema, however bilateral feet are warm.   Pulmonary/Chest: Effort normal.  Musculoskeletal: Normal range of motion. He exhibits edema (moderate pitting edema R>L).    Neurological: He is alert and oriented to person, place, and time.  Skin: He is not diaphoretic.  No active ulceration. No active blistering.   Psychiatric: He has a normal mood and affect. His behavior is normal. Judgment and thought content normal.   BP (!) 175/84 (BP Location: Right Arm)   Pulse 67   Resp 16   Ht 5\' 5"  (1.651 m)   Wt 273 lb (123.8 kg)   BMI 45.43 kg/m   Past Medical History:  Diagnosis Date  . Adrenal gland anomaly   . Arthritis   . CAD (coronary artery disease)    s/p MI x 3 now with 2 stents  . CHF (congestive heart failure) (HCC)   . Colon polyps   . Diabetes mellitus with neuropathy (HCC)   . DM2 (diabetes mellitus, type 2) (HCC)    insulin requiring  . Dyslipidemia   . GERD (gastroesophageal reflux disease)   . Heart disease   . High cholesterol   . HTN (hypertension)   . Kidney disease   . Kidney stones   . Metabolic syndrome   . OSA (obstructive sleep apnea)   . 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 Recurrent UTI   . Renal artery stenosis (HCC)    L sided, stent 07/2013  . Rotator cuff disorder    right  . Skin cancer    head  . Thyroid disorder    Social History   Social History  .  Marital status: Married    Spouse name: N/A  . Number of children: 2  . Years of education: N/A   Occupational History  . 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  Disabled   Social History Main Topics  . Smoking status: Never Smoker  . Smokeless tobacco: Never Used  . Alcohol use No  . Drug use: No  . Sexual activity: Not on file   Other Topics Concern  . Not on file   Social History Narrative   Enjoys fishing   Public librarian   Past Surgical History:  Procedure Laterality Date  . adrenal adenoma removal  1990-   right  . CARPAL TUNNEL RELEASE     bilateral  . CORONARY  ANGIOPLASTY WITH STENT PLACEMENT  2007   x 2  . KNEE SURGERY     right  . LUNG BIOPSY    . RENAL ARTERY STENT  2015   L  . SKIN CANCER EXCISION     back of head   Family History  Problem Relation Age of Onset  . Heart attack Father   . Heart attack Mother   . Heart attack Brother   . COPD Sister   . Arthritis Maternal Grandmother   . Breast cancer Maternal Aunt   . Diabetes Maternal Aunt   . Hypertension Other   . Hypertension Maternal Aunt    Allergies  Allergen Reactions  . Calcium Channel Blockers     Would avoid if possible due to h/o peripheral edema      Assessment & Plan:  The patient presents for a six month renal artery stenosis follow up. The patient is s/p left renal artery stent on 07/21/2013. The patient underwent a renal artery duplex exam which was notable for no significant stenosis.  Patient reports good HTN control and no change from baseline kidney function. He has recently seen Dr. Para March for bilateral chronic lower extremity edema. Recent blistering and weeping noted to right lower extremity. Dr. Para March recommended OTC unna wraps. Patient is not engaging in elevation at this time. No active weeping / ulceration. He has a history of venous ablation to his lower extremity. Denies any fever, nausea or vomiting.    1. Renal artery stenosis, native (HCC) - Stable Studies reviewed. Duplex without stenosis. Patient to continue medical optimization with ASA, antihypertensives and dyslipidemia medication. We will continue to surveil with a renal duplex and will see the patient back in one year. Patient to remain abstinent of tobacco use. I have discussed with the patient at length the risk factors for and pathogenesis of atherosclerotic disease and encouraged a healthy diet, regular exercise regimen and blood pressure / glucose control.  Patient was instructed to contact our office in the interim with problems such as increasing / uncontrollable hypertension or  changes in kidney function. The patient expresses their understanding.  - VAS US RENAL ARTERY DUPLEX; Future  2. Chronic edema - Worsening Now with worsening edema. Intermittent blistering and weeping. No active ulceration however bilateral moderate pitting edema. Recommend bilateral unna wraps to gain control over edema. Possible lymphedema pump in future. Elevation at heart level or higher.   - VAS [place] LOWER EXTREMITY VENOUS REFLUX; Future  3. Type 2 diabetes mellitus with chronic kidney disease, with long-term current use of insulin, unspecified CKD stage (HCC) - Stable Encouraged good control as its slows the progression of atherosclerotic disease  Current  Outpatient Prescriptions on File Prior to Visit  Medication Sig Dispense Refill  . ACCU-CHEK SOFTCLIX LANCETS lancets Use as instructed to check blood sugar three times daily and as needed.  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 100 each 5  . aspirin 81 MG tablet Take 81 mg by mouth 2 (two) times daily.      Marland Kitchen atorvastatin (LIPITOR) 80 MG tablet TAKE 1 TABLET AT BEDTIME 90 tablet 2  . Blood Glucose Monitoring Suppl (ACCU-CHEK AVIVA PLUS) w/Device KIT Use to check blood sugar three times daily and as needed.  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 carvedilol (COREG) 25 MG tablet TAKE 1 TABLET TWICE DAILY WITH A MEAL 180 tablet 2  . cloNIDine (CATAPRES) 0.3 MG tablet TAKE 1 TABLET THREE TIMES DAILY 270 tablet 3  . clopidogrel (PLAVIX) 75 MG tablet Take 1 tablet (75 mg total) by mouth daily. 30 tablet 3  . finasteride (PROSCAR) 5 MG tablet Once daily    . furosemide (LASIX) 80 MG tablet TAKE 1 TABLET EVERY OTHER DAY 45 tablet 2  . gabapentin (NEURONTIN) 800 MG tablet TAKE 1 TABLET IN THE MORNING  AND TAKE 1 AND 1/2 TABLETS AT BEDTIME 225 tablet 3  . glucose blood (ACCU-CHEK AVIVA PLUS) test strip Use as instructed to check blood sugar 3 times daily or as needed.  Diagnosis:  E11.22  Insulin-dependent. 300 each 3  .  hydrALAZINE (APRESOLINE) 50 MG tablet Take 1 tablet (50 mg total) by mouth 3 (three) times daily. 270 tablet 4  . insulin NPH-regular Human (NOVOLIN 70/30) (70-30) 100 UNIT/ML injection INJECT  70 UNITS SUBCUTANEOUSLY EVERY MORNING  AND  35-45 UNITS IN THE EVENING 10 vial 1  . Insulin Syringe-Needle U-100 (INSULIN SYRINGE 1CC/30GX5/16") 30G X 5/16" 1 ML MISC Use as directed to take insulin Dx 250.62 180 each 1  . isosorbide mononitrate (IMDUR) 60 MG 24 hr tablet TAKE 1 TABLET TWICE DAILY 180 tablet 3  . nitroGLYCERIN (NITROSTAT) 0.4 MG SL tablet Place 1 tablet (0.4 mg total) under the tongue every 5 (five) minutes as needed. May repeat x3 25 tablet prn  . NON FORMULARY Oxygen 2 liters 24/7    . omeprazole (PRILOSEC) 20 MG capsule Take 20 mg by mouth daily.      Marland Kitchen oxyCODONE (ROXICODONE) 15 MG immediate release tablet Take 1 tablet (15 mg total) by mouth daily as needed for pain. Fill on/after 09/29/16 30 tablet 0  . oxyCODONE (ROXICODONE) 15 MG immediate release tablet Take 1 tablet (15 mg total) by mouth daily as needed for pain. Fill on/after 10/30/16 30 tablet 0  . oxyCODONE (ROXICODONE) 15 MG immediate release tablet Take 1 tablet (15 mg total) by mouth every evening. 30 tablet 0  . triamterene-hydrochlorothiazide (MAXZIDE-25) 37.5-25 MG tablet Take 1 tablet by mouth daily. 90 tablet 3   No current facility-administered medications on file prior to visit.     There are no Patient Instructions on file for this visit. No Follow-up on file.   Pallas Wahlert A Oshae Simmering, PA-C

## 2016-10-12 DIAGNOSIS — I10 Essential (primary) hypertension: Secondary | ICD-10-CM | POA: Diagnosis not present

## 2016-10-12 DIAGNOSIS — I214 Non-ST elevation (NSTEMI) myocardial infarction: Secondary | ICD-10-CM | POA: Diagnosis not present

## 2016-10-16 ENCOUNTER — Encounter (INDEPENDENT_AMBULATORY_CARE_PROVIDER_SITE_OTHER): Payer: Self-pay | Admitting: Vascular Surgery

## 2016-10-16 ENCOUNTER — Other Ambulatory Visit: Payer: Self-pay

## 2016-10-16 ENCOUNTER — Ambulatory Visit (INDEPENDENT_AMBULATORY_CARE_PROVIDER_SITE_OTHER): Payer: Medicare HMO | Admitting: Vascular Surgery

## 2016-10-16 VITALS — BP 117/58 | HR 63 | Resp 16 | Ht 65.0 in | Wt 271.0 lb

## 2016-10-16 DIAGNOSIS — R609 Edema, unspecified: Secondary | ICD-10-CM | POA: Diagnosis not present

## 2016-10-16 NOTE — Telephone Encounter (Signed)
Refill sent for Isosorbide mono ER 60 mg

## 2016-10-16 NOTE — Progress Notes (Signed)
History of Present Illness  There is no documented history at this time  Assessments & Plan   There are no diagnoses linked to this encounter.    Additional instructions  Subjective:  Patient presents with venous ulcer of the Bilateral lower extremity.    Procedure:  3 layer unna wrap was placed Bilateral lower extremity.   Plan:   Follow up in one week.  

## 2016-10-17 ENCOUNTER — Other Ambulatory Visit: Payer: Self-pay | Admitting: *Deleted

## 2016-10-17 MED ORDER — ISOSORBIDE MONONITRATE ER 60 MG PO TB24: 60.0000 mg | ORAL_TABLET | Freq: Two times a day (BID) | ORAL | 3 refills | Status: DC

## 2016-10-17 NOTE — Telephone Encounter (Signed)
Faxed refill request. Last office visit:   10/06/16.  Last Filled:    225 tablet 3 08/04/2015  Please advise.

## 2016-10-18 MED ORDER — GABAPENTIN 800 MG PO TABS: ORAL_TABLET | ORAL | 3 refills | Status: DC

## 2016-10-18 NOTE — Telephone Encounter (Signed)
Sent. Thanks.   

## 2016-10-23 ENCOUNTER — Encounter (INDEPENDENT_AMBULATORY_CARE_PROVIDER_SITE_OTHER): Payer: Self-pay | Admitting: Vascular Surgery

## 2016-10-23 ENCOUNTER — Ambulatory Visit (INDEPENDENT_AMBULATORY_CARE_PROVIDER_SITE_OTHER): Payer: Medicare HMO | Admitting: Vascular Surgery

## 2016-10-23 VITALS — BP 151/73 | HR 58 | Resp 16 | Ht 63.0 in | Wt 272.0 lb

## 2016-10-23 DIAGNOSIS — J982 Interstitial emphysema: Secondary | ICD-10-CM | POA: Diagnosis not present

## 2016-10-23 DIAGNOSIS — I890 Lymphedema, not elsewhere classified: Secondary | ICD-10-CM | POA: Diagnosis not present

## 2016-10-23 NOTE — Progress Notes (Signed)
History of Present Illness  There is no documented history at this time  Assessments & Plan   There are no diagnoses linked to this encounter.    Additional instructions  Subjective:  Patient presents with venous ulcer of the Bilateral lower extremity.    Procedure:  3 layer unna wrap was placed Bilateral lower extremity.   Plan:   Follow up in one week.  

## 2016-10-24 ENCOUNTER — Other Ambulatory Visit: Payer: Self-pay | Admitting: *Deleted

## 2016-10-24 MED ORDER — ISOSORBIDE MONONITRATE ER 60 MG PO TB24: 60.0000 mg | ORAL_TABLET | Freq: Two times a day (BID) | ORAL | 3 refills | Status: DC

## 2016-10-24 NOTE — Telephone Encounter (Signed)
Received faxed refill request from [facility] for Gabapentin Last refill 10/18/16 #225/3 to [place] office visit  10/06/16 Called and spoke to patient and was advised that he has told the folks at [facility] that he does not want the refill there Patient stated that he wants the script sent to [facility] because it won't cost him anything.

## 2016-10-25 MED ORDER — GABAPENTIN 800 MG PO TABS: ORAL_TABLET | ORAL | 3 refills | Status: DC

## 2016-10-25 NOTE — Telephone Encounter (Signed)
Sent. Thanks.   

## 2016-10-30 ENCOUNTER — Ambulatory Visit (INDEPENDENT_AMBULATORY_CARE_PROVIDER_SITE_OTHER): Payer: Medicare HMO | Admitting: Vascular Surgery

## 2016-10-30 ENCOUNTER — Encounter (INDEPENDENT_AMBULATORY_CARE_PROVIDER_SITE_OTHER): Payer: Self-pay

## 2016-10-30 VITALS — BP 183/72 | HR 59 | Resp 16

## 2016-10-30 DIAGNOSIS — I890 Lymphedema, not elsewhere classified: Secondary | ICD-10-CM | POA: Diagnosis not present

## 2016-10-30 NOTE — Progress Notes (Signed)
History of Present Illness  There is no documented history at this time  Assessments & Plan   There are no diagnoses linked to this encounter.    Additional instructions  Subjective:  Patient presents with venous ulcer of the Bilateral lower extremity.    Procedure:  3 layer unna wrap was placed Bilateral lower extremity.   Plan:   Follow up in one week.  

## 2016-11-01 DIAGNOSIS — L570 Actinic keratosis: Secondary | ICD-10-CM | POA: Diagnosis not present

## 2016-11-01 DIAGNOSIS — Z85828 Personal history of other malignant neoplasm of skin: Secondary | ICD-10-CM | POA: Diagnosis not present

## 2016-11-01 DIAGNOSIS — X32XXXA Exposure to sunlight, initial encounter: Secondary | ICD-10-CM | POA: Diagnosis not present

## 2016-11-01 DIAGNOSIS — Z08 Encounter for follow-up examination after completed treatment for malignant neoplasm: Secondary | ICD-10-CM | POA: Diagnosis not present

## 2016-11-01 DIAGNOSIS — T22131A Burn of first degree of right upper arm, initial encounter: Secondary | ICD-10-CM | POA: Diagnosis not present

## 2016-11-03 NOTE — Telephone Encounter (Signed)
Called pt. VM not set up. Pt due for AWV and CPE

## 2016-11-05 DIAGNOSIS — J449 Chronic obstructive pulmonary disease, unspecified: Secondary | ICD-10-CM | POA: Diagnosis not present

## 2016-11-06 ENCOUNTER — Ambulatory Visit (INDEPENDENT_AMBULATORY_CARE_PROVIDER_SITE_OTHER): Payer: Medicare HMO | Admitting: Vascular Surgery

## 2016-11-06 ENCOUNTER — Encounter (INDEPENDENT_AMBULATORY_CARE_PROVIDER_SITE_OTHER): Payer: Self-pay | Admitting: Vascular Surgery

## 2016-11-06 VITALS — BP 151/68 | HR 62 | Resp 16 | Wt 270.0 lb

## 2016-11-06 DIAGNOSIS — N183 Chronic kidney disease, stage 3 unspecified: Secondary | ICD-10-CM

## 2016-11-06 DIAGNOSIS — I890 Lymphedema, not elsewhere classified: Secondary | ICD-10-CM | POA: Diagnosis not present

## 2016-11-06 DIAGNOSIS — I701 Atherosclerosis of renal artery: Secondary | ICD-10-CM | POA: Diagnosis not present

## 2016-11-06 NOTE — Progress Notes (Signed)
Subjective:    Patient ID: Stephen [name], male    DOB: [birth year], 71 y.o.   MRN: [id number] Chief Complaint  Patient presents with  . Follow-up   Patient presents after four weeks of unna wraps to control bilateral lower extremity edema. This has provided minimal improvement in symptoms. He is due to return in 12/2016 to undergo a bilateral venous duplex to rule out reflux as a contributing factor. He is ready to transition to compression stockings. He states he has been elevating his legs. He denies any skin breakdown. He denies any fever, nausea or vomiting.    Review of Systems  Constitutional: Negative.   HENT: Negative.   Eyes: Negative.   Respiratory: Negative.   Cardiovascular: Positive for leg swelling.  Gastrointestinal: Negative.   Endocrine: Negative.   Genitourinary: Negative.   Musculoskeletal: Negative.   Skin: Negative.   Allergic/Immunologic: Negative.   Neurological: Negative.   Hematological: Negative.   Psychiatric/Behavioral: Negative.       Objective:   Physical Exam  Constitutional: He is oriented to person, place, and time. He appears well-developed and well-nourished. No distress.  HENT:  Head: Normocephalic and atraumatic.  Eyes: Conjunctivae are normal. Pupils are equal, round, and reactive to light.  Neck: Normal range of motion.  Cardiovascular: Normal rate, regular rhythm and normal heart sounds.   Pulmonary/Chest: Effort normal.  Musculoskeletal: Normal range of motion. He exhibits edema (Moderate edema bilaterally).  Neurological: He is alert and oriented to person, place, and time.  Skin: Skin is warm and dry. He is not diaphoretic.  Psychiatric: He has a normal mood and affect. His behavior is normal. Judgment and thought content normal.  Vitals reviewed.  BP (!) 151/68   Pulse 62   Resp 16   Wt 270 lb (122.5 kg)   BMI 47.83 kg/m   Past Medical History:  Diagnosis Date  . Adrenal gland anomaly   . Arthritis   . CAD (coronary  artery disease)    s/p MI x 3 now with 2 stents  . CHF (congestive heart failure) (HCC)   . Colon polyps   . Diabetes mellitus with neuropathy (HCC)   . DM2 (diabetes mellitus, type 2) (HCC)    insulin requiring  . Dyslipidemia   . GERD (gastroesophageal reflux disease)   . Heart disease   . High cholesterol   . HTN (hypertension)   . Kidney disease   . Kidney stones   . Metabolic syndrome   . OSA (obstructive sleep apnea)   . 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 Recurrent UTI   . Renal artery stenosis (HCC)    L sided, stent 07/2013  . Rotator cuff disorder    right  . Skin cancer    head  . Thyroid disorder    Social History   Social History  . Marital status: Married    Spouse name: N/A  . Number of children: 2  . Years of education: N/A   Occupational History  . 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  Disabled   Social History Main Topics  . Smoking status: Never Smoker  . Smokeless tobacco: Never Used  . Alcohol use No  . Drug use: No  . Sexual activity: Not on file   Other Topics Concern  . Not on file  Social History Narrative   Enjoys fishing   Public librarian   Past Surgical History:  Procedure Laterality Date  . adrenal adenoma removal  1990-   right  . CARPAL TUNNEL RELEASE     bilateral  . CORONARY ANGIOPLASTY WITH STENT PLACEMENT  2007   x 2  . KNEE SURGERY     right  . LUNG BIOPSY    . RENAL ARTERY STENT  2015   L  . SKIN CANCER EXCISION     back of head   Family History  Problem Relation Age of Onset  . Heart attack Father   . Heart attack Mother   . Heart attack Brother   . COPD Sister   . Arthritis Maternal Grandmother   . Breast cancer Maternal Aunt   . Diabetes Maternal Aunt   . Hypertension Other   . Hypertension Maternal Aunt    Allergies  Allergen  Reactions  . Calcium Channel Blockers     Would avoid if possible due to h/o peripheral edema      Assessment & Plan:  Patient presents after four weeks of unna wraps to control bilateral lower extremity edema. This has provided minimal improvement in symptoms. He is due to return in 12/2016 to undergo a bilateral venous duplex to rule out reflux as a contributing factor. He is ready to transition to compression stockings. He states he has been elevating his legs. He denies any skin breakdown. He denies any fever, nausea or vomiting.   1. Renal artery stenosis, native (HCC) - Stable Followed on a regular basis with surveillance duplexes.   2. Chronic kidney disease, stage III (moderate) - Stable  Contributing factor to lower extremity edema.  3. Lymphedema - Stable Minimal improvement with unna wraps for four weeks. Not sure if patient is elevating appropriately. Reviewed proper elevation techniques. The patient was encouraged to wear graduated compression stockings (20-30 mmHg) on a daily basis. The patient was instructed to begin wearing the stockings first thing in the morning and removing them in the evening. The patient was instructed specifically not to sleep in the stockings. Prescription given. In addition, behavioral modification including elevation during the Anti-inflammatories for pain. Patient to follow up in 12/2016 for venous duplex.  Current Outpatient Prescriptions on File Prior to Visit  Medication Sig Dispense Refill  . ACCU-CHEK SOFTCLIX LANCETS lancets Use as instructed to check blood sugar three times daily and as needed.  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 100 each 5  . aspirin 81 MG tablet Take 81 mg by mouth 2 (two) times daily.      Marland Kitchen atorvastatin (LIPITOR) 80 MG tablet TAKE 1 TABLET AT BEDTIME 90 tablet 2  . Blood Glucose Monitoring Suppl (ACCU-CHEK AVIVA PLUS) w/Device KIT Use to check blood sugar three times daily and as needed.  Diagnosis: E11.22   Insulin-dependent 1 kit 0  . carvedilol (COREG) 25 MG tablet TAKE 1 TABLET TWICE DAILY WITH A MEAL 180 tablet 2  . cloNIDine (CATAPRES) 0.3 MG tablet TAKE 1 TABLET THREE TIMES DAILY 270 tablet 3  . clopidogrel (PLAVIX) 75 MG tablet Take 1 tablet (75 mg total) by mouth daily. 30 tablet 3  . finasteride (PROSCAR) 5 MG tablet Once daily    . fluticasone (FLONASE) 50 MCG/ACT nasal spray     . furosemide (LASIX) 80 MG tablet TAKE 1 TABLET EVERY OTHER DAY 45 tablet 2  . gabapentin (NEURONTIN) 800 MG tablet TAKE 1 TABLET IN THE MORNING  AND TAKE 1 AND  1/2 TABLETS AT BEDTIME 225 tablet 3  . glucose blood (ACCU-CHEK AVIVA PLUS) test strip Use as instructed to check blood sugar 3 times daily or as needed.  Diagnosis:  E11.22  Insulin-dependent. 300 each 3  . hydrALAZINE (APRESOLINE) 50 MG tablet Take 1 tablet (50 mg total) by mouth 3 (three) times daily. 270 tablet 4  . insulin NPH-regular Human (NOVOLIN 70/30) (70-30) 100 UNIT/ML injection INJECT  70 UNITS SUBCUTANEOUSLY EVERY MORNING  AND  35-45 UNITS IN THE EVENING 10 vial 1  . Insulin Syringe-Needle U-100 (INSULIN SYRINGE 1CC/30GX5/16") 30G X 5/16" 1 ML MISC Use as directed to take insulin Dx 250.62 180 each 1  . isosorbide mononitrate (IMDUR) 60 MG 24 hr tablet Take 1 tablet (60 mg total) by mouth 2 (two) times daily. 180 tablet 3  . nitroGLYCERIN (NITROSTAT) 0.4 MG SL tablet Place 1 tablet (0.4 mg total) under the tongue every 5 (five) minutes as needed. May repeat x3 25 tablet prn  . NON FORMULARY Oxygen 2 liters 24/7    . omeprazole (PRILOSEC) 20 MG capsule Take 20 mg by mouth daily.      Marland Kitchen oxyCODONE (ROXICODONE) 15 MG immediate release tablet Take 1 tablet (15 mg total) by mouth daily as needed for pain. Fill on/after 09/29/16 30 tablet 0  . oxyCODONE (ROXICODONE) 15 MG immediate release tablet Take 1 tablet (15 mg total) by mouth daily as needed for pain. Fill on/after 10/30/16 30 tablet 0  . oxyCODONE (ROXICODONE) 15 MG immediate release tablet Take  1 tablet (15 mg total) by mouth every evening. 30 tablet 0  . triamterene-hydrochlorothiazide (MAXZIDE-25) 37.5-25 MG tablet Take 1 tablet by mouth daily. 90 tablet 3   No current facility-administered medications on file prior to visit.     There are no Patient Instructions on file for this visit. No Follow-up on file.   Arliss Hepburn A Brandii Lakey, PA-C

## 2016-11-22 DIAGNOSIS — J982 Interstitial emphysema: Secondary | ICD-10-CM | POA: Diagnosis not present

## 2016-11-23 ENCOUNTER — Ambulatory Visit
Admission: RE | Admit: 2016-11-23 | Discharge: 2016-11-23 | Disposition: A | Discharge status: Home / Self Care | Payer: Medicare HMO | Source: Ambulatory Visit | Attending: Nephrology | Admitting: Nephrology

## 2016-11-23 ENCOUNTER — Other Ambulatory Visit: Payer: Self-pay | Admitting: Nephrology

## 2016-11-23 DIAGNOSIS — N2581 Secondary hyperparathyroidism of renal origin: Secondary | ICD-10-CM | POA: Diagnosis not present

## 2016-11-23 DIAGNOSIS — R109 Unspecified abdominal pain: Secondary | ICD-10-CM

## 2016-11-23 DIAGNOSIS — E279 Disorder of adrenal gland, unspecified: Secondary | ICD-10-CM | POA: Diagnosis not present

## 2016-11-23 DIAGNOSIS — N183 Chronic kidney disease, stage 3 (moderate): Secondary | ICD-10-CM | POA: Diagnosis not present

## 2016-11-23 DIAGNOSIS — N200 Calculus of kidney: Secondary | ICD-10-CM | POA: Insufficient documentation

## 2016-11-23 DIAGNOSIS — R809 Proteinuria, unspecified: Secondary | ICD-10-CM | POA: Diagnosis not present

## 2016-11-23 DIAGNOSIS — I701 Atherosclerosis of renal artery: Secondary | ICD-10-CM | POA: Diagnosis not present

## 2016-11-23 DIAGNOSIS — N400 Enlarged prostate without lower urinary tract symptoms: Secondary | ICD-10-CM | POA: Insufficient documentation

## 2016-11-23 DIAGNOSIS — R310 Gross hematuria: Secondary | ICD-10-CM | POA: Diagnosis not present

## 2016-11-23 DIAGNOSIS — R600 Localized edema: Secondary | ICD-10-CM | POA: Diagnosis not present

## 2016-11-23 DIAGNOSIS — N289 Disorder of kidney and ureter, unspecified: Secondary | ICD-10-CM | POA: Insufficient documentation

## 2016-11-23 DIAGNOSIS — I10 Essential (primary) hypertension: Secondary | ICD-10-CM | POA: Diagnosis not present

## 2016-11-23 DIAGNOSIS — E1122 Type 2 diabetes mellitus with diabetic chronic kidney disease: Secondary | ICD-10-CM | POA: Diagnosis not present

## 2016-11-24 ENCOUNTER — Other Ambulatory Visit: Payer: Self-pay

## 2016-11-27 ENCOUNTER — Other Ambulatory Visit: Payer: Self-pay | Admitting: Family Medicine

## 2016-11-27 DIAGNOSIS — E1122 Type 2 diabetes mellitus with diabetic chronic kidney disease: Secondary | ICD-10-CM

## 2016-11-27 DIAGNOSIS — Z794 Long term (current) use of insulin: Secondary | ICD-10-CM

## 2016-12-04 ENCOUNTER — Other Ambulatory Visit (INDEPENDENT_AMBULATORY_CARE_PROVIDER_SITE_OTHER): Payer: Medicare HMO

## 2016-12-04 DIAGNOSIS — E1122 Type 2 diabetes mellitus with diabetic chronic kidney disease: Secondary | ICD-10-CM | POA: Diagnosis not present

## 2016-12-04 DIAGNOSIS — Z794 Long term (current) use of insulin: Secondary | ICD-10-CM | POA: Diagnosis not present

## 2016-12-04 LAB — HEMOGLOBIN A1C: Hgb A1c MFr Bld: 7.5 % — ABNORMAL HIGH (ref 4.6–6.5)

## 2016-12-06 DIAGNOSIS — J449 Chronic obstructive pulmonary disease, unspecified: Secondary | ICD-10-CM | POA: Diagnosis not present

## 2016-12-07 ENCOUNTER — Ambulatory Visit (INDEPENDENT_AMBULATORY_CARE_PROVIDER_SITE_OTHER): Payer: Medicare HMO | Admitting: Family Medicine

## 2016-12-07 ENCOUNTER — Encounter: Payer: Self-pay | Admitting: Family Medicine

## 2016-12-07 DIAGNOSIS — M5442 Lumbago with sciatica, left side: Secondary | ICD-10-CM | POA: Diagnosis not present

## 2016-12-07 DIAGNOSIS — R600 Localized edema: Secondary | ICD-10-CM

## 2016-12-07 DIAGNOSIS — R319 Hematuria, unspecified: Secondary | ICD-10-CM | POA: Diagnosis not present

## 2016-12-07 DIAGNOSIS — Z794 Long term (current) use of insulin: Secondary | ICD-10-CM | POA: Diagnosis not present

## 2016-12-07 DIAGNOSIS — G8929 Other chronic pain: Secondary | ICD-10-CM

## 2016-12-07 DIAGNOSIS — E1122 Type 2 diabetes mellitus with diabetic chronic kidney disease: Secondary | ICD-10-CM | POA: Diagnosis not present

## 2016-12-07 MED ORDER — OXYCODONE HCL 15 MG PO TABS: 15.0000 mg | ORAL_TABLET | Freq: Every evening | ORAL | 0 refills | Status: DC

## 2016-12-07 NOTE — Patient Instructions (Signed)
Don't change your meds for now.  Update me as needed.   Recheck labs in about 3-4 months at or before a visit.  Take care.  Glad to see you.

## 2016-12-07 NOTE — Progress Notes (Signed)
Diabetes:  Using medications without difficulties:yes Hypoglycemic episodes:no Hyperglycemic episodes:no Feet problems:no Blood Sugars averaging: usually ~100 +/- 20 eye exam within last year: due, d/w pt.   A1c improved, he is getting out of the house more.  D/w pt.   Back pain continues. Some relief with oxycodone, not used daily, rx printed and given to patient.  On max dose of gabapentin given his renal function.  He tries to limit his oxycodone use, on principle.  He uses with and the recliner with some relief.    He has f/u with vein clinic pending re: u/s in legs.   He has f/u with urology pending re: blood in urine and prev imaging discussed with patient.  No more blood in urine in the meantime.   CT prev with:  Small nonobstructing right renal stones. No left renal stones or hydronephrosis.  Enlarging low-density lesions in the left kidney. New small low-density lesion in the midpole of the right kidney. Given the change since prior study, recommend further evaluation with MRI or CT without and with contrast using renal protocol.  PMH and SH reviewed  ROS: Per HPI unless specifically indicated in ROS section.  No FCNAVD.  No rash.  No abd pain.  No new weakness.   Meds, vitals, and allergies reviewed.   GEN: nad, alert and oriented, on O2 at baseline.   HEENT: mucous membranes moist NECK: supple w/o LA CV: rrr. PULM: ctab, no inc wob ABD: soft, +bs EXT: 1+ BLE edema SKIN: no acute rash In compression stockings.

## 2016-12-08 DIAGNOSIS — R319 Hematuria, unspecified: Secondary | ICD-10-CM | POA: Insufficient documentation

## 2016-12-08 NOTE — Assessment & Plan Note (Signed)
A1c improved. Not to goal but clearly improved. Continue as is. He is working on diet and he has been able to exercise little bit more, able to get out of the house a little more. Discussed with patient. Recheck in a few months.

## 2016-12-08 NOTE — Assessment & Plan Note (Signed)
Prescription done for oxycodone. No change in medication. He is on likely highest reasonable dose of gabapentin given his kidney function. No sedation with oxycodone. Continuing as is is likely the best option given his other medical conditions. He agrees.

## 2016-12-08 NOTE — Assessment & Plan Note (Signed)
He has f/u with urology pending re: blood in urine and prev imaging discussed with patient.  No more blood in urine in the meantime.   She has multiple potential causes noted with renal stones and the lesion noted in the left kidney. I will await urology input. Discussed with patient. He agrees.  CT prev with:  Small nonobstructing right renal stones. No left renal stones or hydronephrosis.  Enlarging low-density lesions in the left kidney. New small low-density lesion in the midpole of the right kidney. Given the change since prior study, recommend further evaluation with MRI or CT without and with contrast using renal protocol.

## 2016-12-08 NOTE — Assessment & Plan Note (Addendum)
He is going to follow-up with the vein clinic.

## 2016-12-11 ENCOUNTER — Ambulatory Visit (INDEPENDENT_AMBULATORY_CARE_PROVIDER_SITE_OTHER): Payer: Medicare HMO

## 2016-12-11 ENCOUNTER — Ambulatory Visit (INDEPENDENT_AMBULATORY_CARE_PROVIDER_SITE_OTHER): Payer: Medicare HMO | Admitting: Vascular Surgery

## 2016-12-11 ENCOUNTER — Encounter (INDEPENDENT_AMBULATORY_CARE_PROVIDER_SITE_OTHER): Payer: Self-pay | Admitting: Vascular Surgery

## 2016-12-11 VITALS — BP 156/67 | HR 54 | Resp 16 | Ht 66.0 in | Wt 269.0 lb

## 2016-12-11 DIAGNOSIS — E782 Mixed hyperlipidemia: Secondary | ICD-10-CM | POA: Diagnosis not present

## 2016-12-11 DIAGNOSIS — R609 Edema, unspecified: Secondary | ICD-10-CM

## 2016-12-11 DIAGNOSIS — E1122 Type 2 diabetes mellitus with diabetic chronic kidney disease: Secondary | ICD-10-CM

## 2016-12-11 DIAGNOSIS — I890 Lymphedema, not elsewhere classified: Secondary | ICD-10-CM

## 2016-12-11 DIAGNOSIS — Z794 Long term (current) use of insulin: Secondary | ICD-10-CM | POA: Diagnosis not present

## 2016-12-11 NOTE — Progress Notes (Signed)
Subjective:    Patient ID: Stephen [name], male    DOB: March 31, 1946, 71 y.o.   MRN: [id number] Chief Complaint  Patient presents with  . Re-evaluation    Ultrasound follow up   Patient presents to review vascular studies. He was last seen on 11/08/2016. Since our last visit, the patient has been wearing medical grade 1 compression stockings, elevating his legs heart level or higher and remaining active with minimal improvement in his symptoms. His his bilateral lower extremity edema and pain associated with the edema have progressed to the point they are tracting his ability to function on a daily basis. The patient underwent a bilateral lower extremity venous reflux exam which was notable for venous incompetence noted in the right common femoral and bilateral popliteal veins as well as bilateral superficial branches off the saphenofemoral junction. No evidence of deep or superficial vein thrombosis in the bilateral lower extremities. Patient with bilateral greater saphenous vein ablation. Patient denies any fever nausea or vomiting.    Review of Systems  Constitutional: Negative.   HENT: Negative.   Eyes: Negative.   Respiratory: Negative.   Cardiovascular: Positive for leg swelling.       Bilateral lower extremity pain  Gastrointestinal: Negative.   Endocrine: Negative.   Genitourinary: Negative.   Musculoskeletal: Negative.   Skin: Negative.   Allergic/Immunologic: Negative.   Neurological: Negative.   Hematological: Negative.   Psychiatric/Behavioral: Negative.       Objective:   Physical Exam  Constitutional: He is oriented to person, place, and time. He appears well-developed and well-nourished. No distress.  HENT:  Head: Normocephalic and atraumatic.  Eyes: Conjunctivae are normal. Pupils are equal, round, and reactive to light.  Neck: Normal range of motion.  Cardiovascular: Normal rate, regular rhythm, normal heart sounds and intact distal pulses.   Pulses:  Radial pulses are 2+ on the right side, and 2+ on the left side.  Hard to appreciate pedal pulses due to body habitus and edema however bilateral feet are warm  Pulmonary/Chest: Effort normal.  Musculoskeletal: Normal range of motion. He exhibits edema (Moderate 1+ pitting edema bilaterally).  Neurological: He is alert and oriented to person, place, and time.  Skin: Skin is warm and dry. He is not diaphoretic.  Skin is intact  Psychiatric: He has a normal mood and affect. His behavior is normal. Judgment and thought content normal.  Vitals reviewed.   BP (!) 156/67 (BP Location: Right Arm)   Pulse (!) 54   Resp 16   Ht 5\' 6"  (1.676 m)   Wt 269 lb (122 kg)   BMI 43.42 kg/m   Past Medical History:  Diagnosis Date  . Adrenal gland anomaly   . Arthritis   . CAD (coronary artery disease)    s/p MI x 3 now with 2 stents  . CHF (congestive heart failure) (HCC)   . Colon polyps   . Diabetes mellitus with neuropathy (HCC)   . DM2 (diabetes mellitus, type 2) (HCC)    insulin requiring  . Dyslipidemia   . GERD (gastroesophageal reflux disease)   . Heart disease   . High cholesterol   . HTN (hypertension)   . Kidney disease   . Kidney stones   . Metabolic syndrome   . OSA (obstructive sleep apnea)   . 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 Recurrent UTI   . Renal artery stenosis (HCC)  L sided, stent 07/2013  . Rotator cuff disorder    right  . Skin cancer    head  . Thyroid disorder     Social History   Social History  . Marital status: Married    Spouse name: N/A  . Number of children: 2  . Years of education: N/A   Occupational History  . 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  Disabled   Social History Main Topics  . Smoking status: Never Smoker  . Smokeless tobacco: Never Used  . Alcohol use  No  . Drug use: No  . Sexual activity: Not on file   Other Topics Concern  . Not on file   Social History Narrative   Enjoys fishing   Public librarian    Past Surgical History:  Procedure Laterality Date  . adrenal adenoma removal  1990-   right  . CARPAL TUNNEL RELEASE     bilateral  . CORONARY ANGIOPLASTY WITH STENT PLACEMENT  2007   x 2  . KNEE SURGERY     right  . LUNG BIOPSY    . RENAL ARTERY STENT  2015   L  . SKIN CANCER EXCISION     back of head    Family History  Problem Relation Age of Onset  . Heart attack Father   . Heart attack Mother   . Heart attack Brother   . COPD Sister   . Arthritis Maternal Grandmother   . Breast cancer Maternal Aunt   . Diabetes Maternal Aunt   . Hypertension Other   . Hypertension Maternal Aunt     Allergies  Allergen Reactions  . Calcium Channel Blockers     Would avoid if possible due to h/o peripheral edema       Assessment & Plan:  Patient presents to review vascular studies. He was last seen on 11/08/2016. Since our last visit, the patient has been wearing medical grade 1 compression stockings, elevating his legs heart level or higher and remaining active with minimal improvement in his symptoms. His his bilateral lower extremity edema and pain associated with the edema have progressed to the point they are tracting his ability to function on a daily basis. The patient underwent a bilateral lower extremity venous reflux exam which was notable for venous incompetence noted in the right common femoral and bilateral popliteal veins as well as bilateral superficial branches off the saphenofemoral junction. No evidence of deep or superficial vein thrombosis in the bilateral lower extremities. Patient with bilateral greater saphenous vein ablation. Patient denies any fever nausea or vomiting.   1. Lymphedema - Worsening Despite conservative treatments including exercise, elevation and class I compression stockings, the patient  still presents with stage I lymphedema. The patient would greatly benefit from the added therapy of a lymphedema pump. Patient and his wife agree. Will apply Patient to continue conservative therapy while awaiting insurance approval  2. Type 2 diabetes mellitus with chronic kidney disease, with long-term current use of insulin, unspecified CKD stage (HCC) - stable Encouraged good control as its slows the progression of atherosclerotic disease  3. Mixed hyperlipidemia - stable Encouraged good control as its slows the progression of atherosclerotic disease   Current Outpatient Prescriptions on File Prior to Visit  Medication Sig Dispense Refill  . ACCU-CHEK SOFTCLIX LANCETS lancets Use as instructed to check blood sugar three times daily and as needed.  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 100 each 5  . aspirin 81 MG tablet Take 81 mg by mouth 2 (two) times daily.      Marland Kitchen atorvastatin (LIPITOR) 80 MG tablet TAKE 1 TABLET AT BEDTIME 90 tablet 2  . Blood Glucose Monitoring Suppl (ACCU-CHEK AVIVA PLUS) w/Device KIT Use to check blood sugar three times daily and as needed.  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 carvedilol (COREG) 25 MG tablet TAKE 1 TABLET TWICE DAILY WITH A MEAL 180 tablet 2  . cloNIDine (CATAPRES) 0.3 MG tablet TAKE 1 TABLET THREE TIMES DAILY 270 tablet 3  . clopidogrel (PLAVIX) 75 MG tablet Take 1 tablet (75 mg total) by mouth daily. 30 tablet 3  . finasteride (PROSCAR) 5 MG tablet Once daily    . fluticasone (FLONASE) 50 MCG/ACT nasal spray     . furosemide (LASIX) 80 MG tablet TAKE 1 TABLET EVERY OTHER DAY 45 tablet 2  . gabapentin (NEURONTIN) 800 MG tablet TAKE 1 TABLET IN THE MORNING  AND TAKE 1 AND 1/2 TABLETS AT BEDTIME 225 tablet 3  . glucose blood (ACCU-CHEK AVIVA PLUS) test strip Use as instructed to check blood sugar 3 times daily or as needed.  Diagnosis:  E11.22  Insulin-dependent. 300 each 3  . hydrALAZINE (APRESOLINE) 50 MG tablet Take 1 tablet (50 mg  total) by mouth 3 (three) times daily. 270 tablet 4  . insulin NPH-regular Human (NOVOLIN 70/30) (70-30) 100 UNIT/ML injection INJECT  70 UNITS SUBCUTANEOUSLY EVERY MORNING  AND  35-45 UNITS IN THE EVENING 10 vial 1  . Insulin Syringe-Needle U-100 (INSULIN SYRINGE 1CC/30GX5/16") 30G X 5/16" 1 ML MISC Use as directed to take insulin Dx 250.62 180 each 1  . isosorbide mononitrate (IMDUR) 60 MG 24 hr tablet Take 1 tablet (60 mg total) by mouth 2 (two) times daily. 180 tablet 3  . nitroGLYCERIN (NITROSTAT) 0.4 MG SL tablet Place 1 tablet (0.4 mg total) under the tongue every 5 (five) minutes as needed. May repeat x3 25 tablet prn  . NON FORMULARY Oxygen 2 liters 24/7    . omeprazole (PRILOSEC) 20 MG capsule Take 20 mg by mouth daily.      Marland Kitchen oxyCODONE (ROXICODONE) 15 MG immediate release tablet Take 1 tablet (15 mg total) by mouth every evening. 30 tablet 0  . triamterene-hydrochlorothiazide (MAXZIDE-25) 37.5-25 MG tablet Take 1 tablet by mouth daily. 90 tablet 3   No current facility-administered medications on file prior to visit.     There are no Patient Instructions on file for this visit. No Follow-up on file.   [facility] A Lilyanne Mcquown, PA-C

## 2016-12-14 ENCOUNTER — Other Ambulatory Visit: Payer: Self-pay

## 2016-12-14 DIAGNOSIS — N200 Calculus of kidney: Secondary | ICD-10-CM

## 2016-12-14 NOTE — Progress Notes (Signed)
uri

## 2016-12-15 ENCOUNTER — Encounter: Payer: Self-pay | Admitting: Urology

## 2016-12-15 ENCOUNTER — Ambulatory Visit (INDEPENDENT_AMBULATORY_CARE_PROVIDER_SITE_OTHER): Payer: Medicare HMO | Admitting: Urology

## 2016-12-15 ENCOUNTER — Other Ambulatory Visit
Admission: RE | Admit: 2016-12-15 | Discharge: 2016-12-15 | Disposition: A | Discharge status: Home / Self Care | Payer: Medicare HMO | Source: Ambulatory Visit | Attending: Urology | Admitting: Urology

## 2016-12-15 VITALS — BP 164/73 | HR 55 | Ht 65.0 in | Wt 262.0 lb

## 2016-12-15 DIAGNOSIS — R3915 Urgency of urination: Secondary | ICD-10-CM

## 2016-12-15 DIAGNOSIS — R310 Gross hematuria: Secondary | ICD-10-CM

## 2016-12-15 DIAGNOSIS — N200 Calculus of kidney: Secondary | ICD-10-CM

## 2016-12-15 DIAGNOSIS — N2889 Other specified disorders of kidney and ureter: Secondary | ICD-10-CM

## 2016-12-15 DIAGNOSIS — N401 Enlarged prostate with lower urinary tract symptoms: Secondary | ICD-10-CM | POA: Diagnosis not present

## 2016-12-15 LAB — URINALYSIS, COMPLETE (UACMP) WITH MICROSCOPIC
Bacteria, UA: NONE SEEN
Bilirubin Urine: NEGATIVE
Glucose, UA: NEGATIVE mg/dL
Hgb urine dipstick: NEGATIVE
Ketones, ur: NEGATIVE mg/dL
Leukocytes, UA: NEGATIVE
Nitrite: NEGATIVE
Protein, ur: >300 mg/dL — AB
RBC / HPF: NONE SEEN RBC/hpf (ref 0–5)
Specific Gravity, Urine: 1.025 (ref 1.005–1.030)
pH: 6 (ref 5.0–8.0)

## 2016-12-15 NOTE — Progress Notes (Signed)
12/15/2016 6:00 PM   [name] 22-Feb-1946 [id number]  Referring provider: Anthonette Legato, MD [place], [place] [address]  Chief Complaint  Patient presents with  . Hematuria    New Patient    HPI: 71 year old referred for further evaluation of right nonobstructing renal calculus, left renal lesions, and a recent episode of painless gross hematuria., as bilateral low density renal lesions.  He was previously followed by Dr. Ernst Spell many years ago. He has not seen a urologist in the recent past.  Gross hematuria Isolated episode of painless gross hematuria. No associated flank pain. He does have a history of kidney stones.  He is on chronic Plavix and aspirin.  Noncontrast CT scan shows possible 5 mm right lower pole nonobstructing calculus.  Never smoker.  History of stones No recent flank pain.  He is passed several stones, approximately 4-5 stones over his lifetime. None recently. Never required surgical intervention for stones.  CT scan findings as above.    History of BPH/ urinary urcency/ frequency He does have a history of BPH previously on finasteride.  Its on his medication list today but he does not think he is taking this medication.    He does have urinary frequency and occasional urgency.  Nocturia x 0-1.   He is on chronic lasix.    Renal lesion Noncontrast CT scan on 11/23/2016 ordered for further evaluation of gross hematuria. He was noted to have an enlarging left upper pole renal lesion measuring 3.2 from 2.1 as well as lower pole lesion measuring 2.83. 22.1 cm as compared to previous CT scan in 2012. At this time, these were consistent with a simple renal cyst. As a new 1.5 cm low-density lesion which is new on the right kidney.  He is unable to receive contrast given poor renal function. He is a history of renal artery stenosis status post stent. Most recent creatinine around 2. GFR in the 30-40 range.  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  He was in the late 1990s at [facility] for benign pathology.     PMH: Past Medical History:  Diagnosis Date  . Adrenal gland anomaly   . Arthritis   . CAD (coronary artery disease)    s/p MI x 3 now with 2 stents  . CHF (congestive heart failure) ([place])   . Colon polyps   . Diabetes mellitus with neuropathy ([place])   . DM2 (diabetes mellitus, type 2) (HCC)    insulin requiring  . Dyslipidemia   . GERD (gastroesophageal reflux disease)   . Heart disease   . High cholesterol   . HTN (hypertension)   . Kidney disease   . Kidney stones   . Metabolic syndrome   . OSA (obstructive sleep apnea)   . 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 Recurrent UTI   . Renal artery stenosis (HCC)    L sided, stent 07/2013  . Rotator cuff disorder    right  . Skin cancer    head  . Thyroid disorder     Surgical History: Past Surgical History:  Procedure Laterality Date  . adrenal adenoma removal  1990-   right  . CARPAL TUNNEL RELEASE     bilateral  . CORONARY ANGIOPLASTY WITH STENT PLACEMENT  2007   x 2  . KNEE SURGERY     right  .  LUNG BIOPSY    . RENAL ARTERY STENT  2015   L  . SKIN CANCER EXCISION     back of head    Home Medications:  Allergies as of 12/15/2016      Reactions   Calcium Channel Blockers    Would avoid if possible due to h/o peripheral edema      Medication List       Accurate as of 12/15/16  6:00 PM. Always use your most recent med list.          ACCU-CHEK AVIVA PLUS w/Device Kit Use to check blood sugar three times daily and as needed.  Diagnosis: E11.22  Insulin-dependent   ACCU-CHEK SOFTCLIX LANCETS lancets Use as instructed to check blood sugar three times daily and as needed.  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   aspirin 81 MG tablet Take 81 mg by mouth 2 (two) times daily.   atorvastatin 80 MG tablet Commonly  known as:  LIPITOR TAKE 1 TABLET AT BEDTIME   carvedilol 25 MG tablet Commonly known as:  COREG TAKE 1 TABLET TWICE DAILY WITH A MEAL   cloNIDine 0.3 MG tablet Commonly known as:  CATAPRES TAKE 1 TABLET THREE TIMES DAILY   clopidogrel 75 MG tablet Commonly known as:  PLAVIX Take 1 tablet (75 mg total) by mouth daily.   finasteride 5 MG tablet Commonly known as:  PROSCAR Once daily   fluticasone 50 MCG/ACT nasal spray Commonly known as:  FLONASE   furosemide 80 MG tablet Commonly known as:  LASIX TAKE 1 TABLET EVERY OTHER DAY   gabapentin 800 MG tablet Commonly known as:  NEURONTIN TAKE 1 TABLET IN THE MORNING  AND TAKE 1 AND 1/2 TABLETS AT BEDTIME   glucose blood test strip Commonly known as:  ACCU-CHEK AVIVA PLUS Use as instructed to check blood sugar 3 times daily or as needed.  Diagnosis:  E11.22  Insulin-dependent.   hydrALAZINE 50 MG tablet Commonly known as:  APRESOLINE Take 1 tablet (50 mg total) by mouth 3 (three) times daily.   insulin NPH-regular Human (70-30) 100 UNIT/ML injection Commonly known as:  NOVOLIN 70/30 INJECT  70 UNITS SUBCUTANEOUSLY EVERY MORNING  AND  35-45 UNITS IN THE EVENING   INSULIN SYRINGE 1CC/30GX5/16" 30G X 5/16" 1 ML Misc Use as directed to take insulin Dx 250.62   isosorbide mononitrate 60 MG 24 hr tablet Commonly known as:  IMDUR Take 1 tablet (60 mg total) by mouth 2 (two) times daily.   nitroGLYCERIN 0.4 MG SL tablet Commonly known as:  NITROSTAT Place 1 tablet (0.4 mg total) under the tongue every 5 (five) minutes as needed. May repeat x3   NON FORMULARY Oxygen 2 liters 24/7   omeprazole 20 MG capsule Commonly known as:  PRILOSEC Take 20 mg by mouth daily.   oxyCODONE 15 MG immediate release tablet Commonly known as:  ROXICODONE Take 1 tablet (15 mg total) by mouth every evening.   triamterene-hydrochlorothiazide 37.5-25 MG tablet Commonly known as:  MAXZIDE-25 Take 1 tablet by mouth daily.       Allergies:    Allergies  Allergen Reactions  . Calcium Channel Blockers     Would avoid if possible due to h/o peripheral edema    Family History: Family History  Problem Relation Age of Onset  . Heart attack Father   . Heart attack Mother   . Heart attack Brother   . COPD Sister   . Arthritis Maternal Grandmother   . Breast cancer Maternal  Aunt   . Diabetes Maternal Aunt   . Hypertension Other   . Hypertension Maternal Aunt     Social History:  reports that he has never smoked. He has never used smokeless tobacco. He reports that he does not drink alcohol or use drugs.  ROS: UROLOGY Frequent Urination?: Yes Hard to postpone urination?: Yes Burning/pain with urination?: No Get up at night to urinate?: No Leakage of urine?: No Urine stream starts and stops?: No Trouble starting stream?: No Do you have to strain to urinate?: No Blood in urine?: Yes Urinary tract infection?: No Sexually transmitted disease?: No Injury to kidneys or bladder?: No Painful intercourse?: No Weak stream?: Yes Erection problems?: Yes Penile pain?: No  Gastrointestinal Nausea?: Yes Vomiting?: No Indigestion/heartburn?: Yes Diarrhea?: No Constipation?: Yes  Constitutional Fever: No Night sweats?: No Weight loss?: No Fatigue?: Yes  Skin Skin rash/lesions?: No Itching?: No  Eyes Blurred vision?: Yes Double vision?: No  Ears/Nose/Throat Sore throat?: No Sinus problems?: Yes  Hematologic/Lymphatic Swollen glands?: No Easy bruising?: Yes  Cardiovascular Leg swelling?: Yes Chest pain?: No  Respiratory Cough?: No Shortness of breath?: Yes  Endocrine Excessive thirst?: Yes  Musculoskeletal Back pain?: Yes Joint pain?: Yes  Neurological Headaches?: No Dizziness?: Yes  Psychologic Depression?: No Anxiety?: No  Physical Exam: BP (!) 164/73   Pulse (!) 55   Ht '5\' 5"'$  (1.651 m)   Wt 262 lb (118.8 kg)   BMI 43.60 kg/m   Constitutional:  Alert and oriented, No acute  distress.  Accompanied by wife today. HEENT: [place] AT, moist mucus membranes.  Trachea midline, no masses. Cardiovascular: No clubbing, cyanosis, or edema. Respiratory: Normal respiratory effort, no increased work of breathing. GI: Abdomen is soft, nontender, nondistended, no abdominal masses. Obese.   GU: No CVA tenderness.  Skin: No rashes, bruises or suspicious lesions. Neurologic: Grossly intact, no focal deficits, moving all 4 extremities. Psychiatric: Normal mood and affect.  Laboratory Data: Lab Results  Component Value Date   WBC 11.1 (H) 11/12/2012   HGB 15.9 11/12/2012   HCT 46.7 11/12/2012   MCV 83.5 11/12/2012   PLT 230.0 11/12/2012    Lab Results  Component Value Date   CREATININE 2.01 (H) 02/18/2016    Lab Results  Component Value Date   HGBA1C 7.5 (H) 12/04/2016    Urinalysis Results for orders placed or performed during the hospital encounter of 12/15/16  Urinalysis, Complete w Microscopic  Result Value Ref Range   Color, Urine YELLOW YELLOW   APPearance CLEAR CLEAR   Specific Gravity, Urine 1.025 1.005 - 1.030   pH 6.0 5.0 - 8.0   Glucose, UA NEGATIVE NEGATIVE mg/dL   Hgb urine dipstick NEGATIVE NEGATIVE   Bilirubin Urine NEGATIVE NEGATIVE   Ketones, ur NEGATIVE NEGATIVE mg/dL   Protein, ur >300 (A) NEGATIVE mg/dL   Nitrite NEGATIVE NEGATIVE   Leukocytes, UA NEGATIVE NEGATIVE   Squamous Epithelial / LPF 0-5 (A) NONE SEEN   WBC, UA 0-5 0 - 5 WBC/hpf   RBC / HPF NONE SEEN 0 - 5 RBC/hpf   Bacteria, UA NONE SEEN NONE SEEN    Pertinent Imaging: CT scan noncontrast from 11/23/16  Reviewed personally today with the patient. This is also compared personally to a CT scan with contrast on 12/23/2010.  Assessment & Plan:    1. Renal mass Probable renal cyst with interval growth on the left, new renal cyst on right. Unable to further characterize renal lesions given inability to give contrast.  I recommended starting  with a renal ultrasound to assess the  complexity of the cysts.  If they are indeed Bosniak one cyst as suspected, no further workup will be indicated.  - US Renal; Future  2. Gross hematuria Isolated episode of painless gross hematuria. Suspect likely related to nephrolithiasis, however, further workup is warranted. I recommended cystoscopy.  He is unable to receive IV contrast for CT urogram. He is not a surgical candidate for cystoscopy, bilateral retrogrades in the OR given his multiple medical comorbidities. Upper tract collecting system and ureters are unable to be evaluated, but given his lack of smoking history, this is unlikely.  3. 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   Unclear patient is on finasteride, he'll call and clarify with our office.  Given his multiple medical comorbidities, we will defer PSA screening at this time.  4. Kidney stones Nonobstructing right lower pole stone, approximately 5 mm.  Recommend conservative management with observation this point in time as he is a poor surgical candidate. Currently asymptomatic.   Return in about 4 weeks (around 01/12/2017) for cystoscopy.  Hollice Espy, MD  [facility] [address], [place], [place] [address] [phone]

## 2016-12-19 ENCOUNTER — Telehealth: Payer: Self-pay | Admitting: Urology

## 2016-12-19 NOTE — Telephone Encounter (Signed)
Pt called office and left message on voicemail to be sure to let Dr. Erlene Quan know that he is not taking finestride. Thanks.

## 2016-12-23 DIAGNOSIS — J982 Interstitial emphysema: Secondary | ICD-10-CM | POA: Diagnosis not present

## 2016-12-25 ENCOUNTER — Ambulatory Visit
Admission: RE | Admit: 2016-12-25 | Discharge: 2016-12-25 | Disposition: A | Discharge status: Home / Self Care | Payer: Medicare HMO | Source: Ambulatory Visit | Attending: Urology | Admitting: Urology

## 2016-12-25 DIAGNOSIS — N281 Cyst of kidney, acquired: Secondary | ICD-10-CM | POA: Insufficient documentation

## 2016-12-25 DIAGNOSIS — N400 Enlarged prostate without lower urinary tract symptoms: Secondary | ICD-10-CM | POA: Diagnosis not present

## 2016-12-25 DIAGNOSIS — N2889 Other specified disorders of kidney and ureter: Secondary | ICD-10-CM

## 2016-12-25 NOTE — Telephone Encounter (Signed)
-----   Message from Hollice Espy, MD sent at 12/25/2016  3:54 PM EDT ----- Your renal ultrasound looks good. We'll discuss this further at your follow-up appointment.  Hollice Espy, MD

## 2016-12-25 NOTE — Telephone Encounter (Signed)
Spoke with patient gave results, no questions  Sharyn Lull

## 2016-12-25 NOTE — Telephone Encounter (Signed)
Called pt no answer , no vmail set up

## 2017-01-05 ENCOUNTER — Encounter: Payer: Self-pay | Admitting: Cardiovascular Disease

## 2017-01-05 ENCOUNTER — Ambulatory Visit (INDEPENDENT_AMBULATORY_CARE_PROVIDER_SITE_OTHER): Payer: Medicare HMO | Admitting: Cardiovascular Disease

## 2017-01-05 ENCOUNTER — Telehealth: Payer: Self-pay | Admitting: Cardiovascular Disease

## 2017-01-05 VITALS — BP 159/70 | HR 60 | Ht 65.0 in | Wt 270.8 lb

## 2017-01-05 DIAGNOSIS — I2510 Atherosclerotic heart disease of native coronary artery without angina pectoris: Secondary | ICD-10-CM

## 2017-01-05 DIAGNOSIS — I5032 Chronic diastolic (congestive) heart failure: Secondary | ICD-10-CM | POA: Diagnosis not present

## 2017-01-05 DIAGNOSIS — I10 Essential (primary) hypertension: Secondary | ICD-10-CM | POA: Diagnosis not present

## 2017-01-05 DIAGNOSIS — E782 Mixed hyperlipidemia: Secondary | ICD-10-CM | POA: Diagnosis not present

## 2017-01-05 DIAGNOSIS — R0602 Shortness of breath: Secondary | ICD-10-CM | POA: Diagnosis not present

## 2017-01-05 DIAGNOSIS — R079 Chest pain, unspecified: Secondary | ICD-10-CM | POA: Diagnosis not present

## 2017-01-05 DIAGNOSIS — J449 Chronic obstructive pulmonary disease, unspecified: Secondary | ICD-10-CM | POA: Diagnosis not present

## 2017-01-05 MED ORDER — TORSEMIDE 20 MG PO TABS: 40.0000 mg | ORAL_TABLET | Freq: Every day | ORAL | 6 refills | Status: DC

## 2017-01-05 NOTE — Telephone Encounter (Signed)
Spoke w/ pt.  He reports that he has had chest pain and SOB occasionally for the past 2-3 weeks. He reports that his sx are increasing in frequency and he had 1 episode of nausea yesterday. He reports being symptomatic this am, he took 2 nitro and sx have resolved.  Offered pt appt w/ Dr. Rockey Situ at 11:20 today or to proceed to the ED now. He would prefer to stay out of the ED and will have his wife bring him to appt in an hour. Advised him that if chest pain recurs, to proceed to the ED. He is agreeable and appreciative of the call.

## 2017-01-05 NOTE — Patient Instructions (Addendum)
Medication Instructions:   Please hold the lasix Start torsemide 40 mg daily for a few days until breathing gets better  If breathing does not improve, call the office next week We would order a stress test  Labwork:  No new labs needed  Testing/Procedures:  No further testing at this time   Follow-Up: It was a pleasure seeing you in the office today. Please call us if you have new issues that need to be addressed before your next appt.  [phone]  Your physician wants you to follow-up in: 3 months.  You will receive a reminder letter in the mail two months in advance. If you don't receive a letter, please call our office to schedule the follow-up appointment.  If you need a refill on your cardiac medications before your next appointment, please call your pharmacy.

## 2017-01-05 NOTE — Telephone Encounter (Signed)
Pt called back states his pain comes and goes , states he took 2 Nitro 45 minutes ago. 1

## 2017-01-05 NOTE — Progress Notes (Signed)
Cardiology Office Note  Date:  01/05/2017   ID:  Stephen [name], DOB 08/15/1945, MRN [id number]  PCP:  Duncan, Graham S, MD   Chief Complaint  Patient presents with  . OTHER    Chest pain. Meds reviewed verbally with pt.    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 Status post stent to his left renal artery for 80+ percent stenosis several weeks ago, performed by Dr. Dew. laser treatment of the lower extremity veins for leg swelling/venous insufficiency who presents for routine followup of his coronary artery disease  And Shortness of breath  In follow-up today he reports having worsening shortness of breath for the past 2 or 3 weeks Etiology unclear Chronic leg swelling, uses compression hose,  also has lymphedema compression pumps but are not using this yet as he is waiting for instructions on how to use it  Significant shortness of breath walking outside in the heat Unable to walk very far before he has to sit down and rest Reports this is worse for him He is taking Lasix every other day, HCTZ daily No recent lab work available  Rare chest pain when he has his shortness of breath on exertion, chest feels tight Once he sits down recovers very quickly  Previous clinic visit also had some chest discomfort to the point where he took nitroglycerin Was sitting at the time in church   hemoglobin A1c 8.4.  total cholesterol 122, LDL 75 Creatinine 2.10, follow-up with Dr. Lateef  lab several months ago with creatinine 1.4  EKG personally reviewed by myself on todays visit Shows normal sinus rhythm with rate 60  bpm poor R-wave progression through the anterior precordial leads, left axis deviation  Other past medical history Stress test July 2011 was dobutamine stress that showed no significant ischemia, inadequate heart rate was achieved. right and left heart catheterization January 2011 showed 50% mid LAD disease, 60% at the ostium of the diagonal #2, 30% proximal left circumflex disease, patent stent in the left circumflex, patent stent of the mid RCA, wedge pressure of 16, PA pressure mean 22, right ventricular pressure 41/8 .  echocardiogram September 2010 shows normal systolic function, diastolic dysfunction, normal RV function unable to evaluate right ventricular systolic pressures  PMH:   has a past medical history of Adrenal gland anomaly; Arthritis; CAD (coronary artery disease); CHF (congestive heart failure) (HCC); Colon polyps; Diabetes mellitus with neuropathy (HCC); DM2 (diabetes mellitus, type 2) (HCC); Dyslipidemia; GERD (gastroesophageal reflux disease); Heart disease; High cholesterol; HTN (hypertension); Kidney disease; Kidney stones; Metabolic syndrome; OSA (obstructive sleep apnea); Pulmonary fibrosis (HCC); Recurrent UTI; Renal artery stenosis (HCC); Rotator cuff disorder; Skin cancer; and Thyroid disorder.  PSH:    Past Surgical History:  Procedure Laterality Date  . adrenal adenoma removal  1990-   right  . CARPAL TUNNEL RELEASE     bilateral  . CORONARY ANGIOPLASTY WITH STENT PLACEMENT  2007   x 2  . KNEE SURGERY     right  . LUNG BIOPSY    . RENAL ARTERY STENT  2015   L  . SKIN CANCER EXCISION     back of head      Current Outpatient Prescriptions  Medication Sig Dispense Refill  . ACCU-CHEK SOFTCLIX LANCETS lancets Use as instructed to check blood sugar three times daily and as needed.  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 100 each 5  . aspirin 81 MG tablet Take 81 mg by mouth 2 (two) times daily.      Marland Kitchen atorvastatin (LIPITOR) 80 MG tablet TAKE 1 TABLET AT BEDTIME  90 tablet 2  . Blood Glucose Monitoring Suppl (ACCU-CHEK AVIVA PLUS) w/Device KIT Use to check blood sugar three times daily and as needed.  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 carvedilol (COREG) 25 MG tablet TAKE 1 TABLET TWICE DAILY WITH A MEAL 180 tablet 2  . cloNIDine (CATAPRES) 0.3 MG tablet TAKE 1 TABLET THREE TIMES DAILY 270 tablet 3  . clopidogrel (PLAVIX) 75 MG tablet Take 1 tablet (75 mg total) by mouth daily. 30 tablet 3  . fluticasone (FLONASE) 50 MCG/ACT nasal spray     . gabapentin (NEURONTIN) 800 MG tablet TAKE 1 TABLET IN THE MORNING  AND TAKE 1 AND 1/2 TABLETS AT BEDTIME 225 tablet 3  . glucose blood (ACCU-CHEK AVIVA PLUS) test strip Use as instructed to check blood sugar 3 times daily or as needed.  Diagnosis:  E11.22  Insulin-dependent. 300 each 3  . hydrALAZINE (APRESOLINE) 50 MG tablet Take 1 tablet (50 mg total) by mouth 3 (three) times daily. 270 tablet 4  . insulin NPH-regular Human (NOVOLIN 70/30) (70-30) 100 UNIT/ML injection INJECT  70 UNITS SUBCUTANEOUSLY EVERY MORNING  AND  35-45 UNITS IN THE EVENING 10 vial 1  . Insulin Syringe-Needle U-100 (INSULIN SYRINGE 1CC/30GX5/16") 30G X 5/16" 1 ML MISC Use as directed to take insulin Dx 250.62 180 each 1  . isosorbide mononitrate (IMDUR) 60 MG 24 hr tablet Take 1 tablet (60 mg total) by mouth 2 (two) times daily. 180 tablet 3  . nitroGLYCERIN (NITROSTAT) 0.4 MG SL tablet Place 1 tablet (0.4 mg total) under the tongue every 5 (five) minutes as needed. May repeat x3 25 tablet prn  . NON FORMULARY Oxygen 2 liters 24/7    . omeprazole (PRILOSEC) 20 MG capsule Take 20 mg by mouth daily.      Marland Kitchen oxyCODONE (ROXICODONE) 15 MG immediate release tablet Take 1 tablet (15 mg total) by mouth every evening. 30 tablet 0  . triamterene-hydrochlorothiazide (MAXZIDE-25) 37.5-25 MG tablet Take 1 tablet by mouth daily. 90 tablet 3  . torsemide (DEMADEX) 20 MG tablet Take 2 tablets (40 mg total) by mouth daily. 60 tablet 6   No current  facility-administered medications for this visit.      Allergies:   Calcium channel blockers   Social History:  The patient  reports that he has never smoked. He has never used smokeless tobacco. He reports that he does not drink alcohol or use drugs.   Family History:   family history includes Arthritis in his maternal grandmother; Breast cancer in his maternal aunt; COPD in his sister; Diabetes in his maternal aunt; Heart attack in his brother, father, and mother; Hypertension in his maternal aunt and other.    Review of Systems: Review of Systems  Constitutional: Negative.   Respiratory: Positive for shortness of breath.   Cardiovascular: Positive for chest pain and leg swelling.  Gastrointestinal: Negative.   Musculoskeletal: Negative.   Neurological: Negative.   Psychiatric/Behavioral: Negative.   All other systems reviewed and are negative.    PHYSICAL EXAM: VS:  BP (!) 159/70 (BP Location: Left Arm, Patient Position:  Sitting, Cuff Size: Large)   Pulse 60   Ht 5' 5" (1.651 m)   Wt 270 lb 12 oz (122.8 kg)   BMI 45.06 kg/m  , BMI Body mass index is 45.06 kg/m. GEN: Well nourished, well developed, in no acute distress  HEENT: normal  Neck: no JVD, carotid bruits, or masses Cardiac: RRR; no murmurs, rubs, or gallops,R?L nonpitting  edema  Respiratory:  clear to auscultation bilaterally, normal work of breathing GI: soft, nontender, nondistended, + BS MS: no deformity or atrophy  Skin: warm and dry, no rash Neuro:  Strength and sensation are intact Psych: euthymic mood, full affect    Recent Labs: 02/18/2016: ALT 21; BUN 32; Creatinine, Ser 2.01; Potassium 3.7; Sodium 141    Lipid Panel Lab Results  Component Value Date   CHOL 122 02/18/2016   HDL 27.20 (L) 02/18/2016   LDLCALC 75 02/18/2016   TRIG 102.0 02/18/2016      Wt Readings from Last 3 Encounters:  01/05/17 270 lb 12 oz (122.8 kg)  12/15/16 262 lb (118.8 kg)  12/11/16 269 lb (122 kg)        ASSESSMENT AND PLAN:  Atherosclerosis of native coronary artery of native heart without angina pectoris -  Unable to exclude underlying ischemia Recommended he try diuresis through the weekend for shortness of breath and if no improvement after 3 days that he call our office and we would schedule stress testing  Essential hypertension, benign - Plan: EKG 12-Lead Continue current medications, increase diuresis Change Lasix to torsemide  Chronic diastolic CHF (congestive heart failure) (HCC) - Plan: EKG 12-Lead Fluid status unclear given chronic leg edema, chronic shortness of breath Worse recently concerning for acute on chronic diastolic CHF Recommended he stop Lasix and start torsemide 40 mg daily  Mixed hyperlipidemia Cholesterol is at goal on the current lipid regimen. No changes to the medications were made.  Renal artery stenosis, native [facility]) followed by Dr. Lucky Cowboy Chronic edema, on lymphedema compression pumps with compression hose  Morbid obesity ([place]) We have encouraged continued exercise, careful diet management in an effort to lose weight.  PULMONARY FIBROSIS On and off oxygen Followed by pulmonary  Type 2 diabetes mellitus with chronic kidney disease, with long-term current use of insulin, unspecified CKD stage (HCC) Uncontrolled, We have encouraged continued exercise, careful diet management in an effort to lose weight. Weight likely contributing to his shortness of breath  Chronic kidney disease, stage III (moderate) Followed by Dr. Holley Raring   Total encounter time more than 25 minutes  Greater than 50% was spent in counseling and coordination of care with the patient   Disposition:   F/U  3 months   Orders Placed This Encounter  Procedures  . EKG 12-Lead     Signed, Esmond Plants, M.D., Ph.D. 01/05/2017  [place], [place]

## 2017-01-05 NOTE — Telephone Encounter (Signed)
Pt c/o of Chest Pain: STAT if CP now or developed within 24 hours  1. Are you having CP right now? Not right now  2. Are you experiencing any other symptoms (ex. SOB, nausea, vomiting, sweating)?  Just tighness in chest last 3-4 days, states it got so bad that it was hard for him to breath  3. How long have you been experiencing CP? A while ago   4. Is your CP continuous or coming and going?   5. Have you taken Nitroglycerin?  ?  Got Disconnected from patient./

## 2017-01-08 ENCOUNTER — Telehealth: Payer: Self-pay | Admitting: Cardiovascular Disease

## 2017-01-08 NOTE — Telephone Encounter (Signed)
Pt calling to give [place] an update  States he is still having SOB along with Cp yesterday He states they come and go  He states he has not been outside, just walks around the home and get SOB   Would like some advise on this Please call

## 2017-01-08 NOTE — Telephone Encounter (Signed)
Do you want him to proceed w/ a stress test? If so, what kind do you want him to have, ETT or Lexi?

## 2017-01-08 NOTE — Telephone Encounter (Signed)
Stress test okay, would need a lexiscan

## 2017-01-09 ENCOUNTER — Telehealth: Payer: Self-pay | Admitting: Cardiovascular Disease

## 2017-01-09 DIAGNOSIS — R0600 Dyspnea, unspecified: Secondary | ICD-10-CM

## 2017-01-09 DIAGNOSIS — R079 Chest pain, unspecified: Secondary | ICD-10-CM

## 2017-01-09 NOTE — Telephone Encounter (Signed)
S/w with patient and wife. Patient verbalized understanding of procedure. Patient had me give the instructions as follows to his wife and she verbalized understanding.  [place]  Your caregiver has ordered a Stress Test with nuclear imaging. The purpose of this test is to evaluate the blood supply to your heart muscle. This procedure is referred to as a "Non-Invasive Stress Test." This is because other than having an IV started in your vein, nothing is inserted or "invades" your body. Cardiac stress tests are done to find areas of poor blood flow to the heart by determining the extent of coronary artery disease (CAD). Some patients exercise on a treadmill, which naturally increases the blood flow to your heart, while others who are  unable to walk on a treadmill due to physical limitations have a pharmacologic/chemical stress agent called Lexiscan . This medicine will mimic walking on a treadmill by temporarily increasing your coronary blood flow.   Please note: these test may take anywhere between 2-4 hours to complete  PLEASE REPORT TO [facility] AT THE FIRST DESK WILL DIRECT YOU WHERE TO GO  Date of Procedure:______07/13/18____________  Arrival Time for Procedure:____07:45 am_________  Instructions regarding medication:   _X_ : TAKE 1/2 dose of INSULIN the night before and Hold INSULIN THE MORNING OF procedure  _X__:  Hold CARVEDILOL THE night before procedure and morning of procedure  _X__:  Hold other medications as follows:______TORSEMIDE___________    PLEASE NOTIFY THE OFFICE AT LEAST 24 HOURS IN ADVANCE IF YOU ARE UNABLE TO KEEP YOUR APPOINTMENT.  [phone] AND  PLEASE NOTIFY NUCLEAR MEDICINE AT [facility] AT LEAST 24 HOURS IN ADVANCE IF YOU ARE UNABLE TO KEEP YOUR APPOINTMENT. [phone]  How to prepare for your Myoview test:  1. Do not eat or drink after midnight 2. No caffeine for 24 hours prior to test 3. No smoking 24 hours prior to  test. 4. Your medication may be taken with water.  If your doctor stopped a medication because of this test, do not take that medication. 5. Ladies, please do not wear dresses.  Skirts or pants are appropriate. Please wear a short sleeve shirt. 6. No perfume, cologne or lotion. 7. Wear comfortable walking shoes. No heels!         ]

## 2017-01-09 NOTE — Telephone Encounter (Signed)
Patient scheduled for 01/12/17 for [place]. See telephone note.

## 2017-01-09 NOTE — Telephone Encounter (Signed)
Pt would like to know the status of his nuclear stress test being scheduled. Please call and advise.

## 2017-01-11 ENCOUNTER — Emergency Department: Payer: Medicare HMO

## 2017-01-11 ENCOUNTER — Encounter: Payer: Self-pay | Admitting: Emergency Medicine

## 2017-01-11 ENCOUNTER — Inpatient Hospital Stay
Admission: EM | Admit: 2017-01-11 | Discharge: 2017-01-13 | DRG: 281 | Disposition: A | Discharge status: Home / Self Care | Payer: Medicare HMO | Attending: Internal Medicine | Admitting: Internal Medicine

## 2017-01-11 ENCOUNTER — Inpatient Hospital Stay (HOSPITAL_COMMUNITY)
Admit: 2017-01-11 | Discharge: 2017-01-11 | Disposition: A | Discharge status: Home / Self Care | Payer: Medicare HMO | Attending: Cardiovascular Disease | Admitting: Cardiovascular Disease

## 2017-01-11 ENCOUNTER — Encounter
Admission: EM | Disposition: A | Discharge status: Home / Self Care | Payer: Self-pay | Source: Home / Self Care | Attending: Internal Medicine

## 2017-01-11 DIAGNOSIS — I130 Hypertensive heart and chronic kidney disease with heart failure and stage 1 through stage 4 chronic kidney disease, or unspecified chronic kidney disease: Secondary | ICD-10-CM | POA: Diagnosis not present

## 2017-01-11 DIAGNOSIS — R0602 Shortness of breath: Secondary | ICD-10-CM | POA: Diagnosis not present

## 2017-01-11 DIAGNOSIS — K219 Gastro-esophageal reflux disease without esophagitis: Secondary | ICD-10-CM | POA: Diagnosis present

## 2017-01-11 DIAGNOSIS — E1122 Type 2 diabetes mellitus with diabetic chronic kidney disease: Secondary | ICD-10-CM | POA: Diagnosis present

## 2017-01-11 DIAGNOSIS — G4733 Obstructive sleep apnea (adult) (pediatric): Secondary | ICD-10-CM | POA: Diagnosis not present

## 2017-01-11 DIAGNOSIS — Z7982 Long term (current) use of aspirin: Secondary | ICD-10-CM

## 2017-01-11 DIAGNOSIS — Z955 Presence of coronary angioplasty implant and graft: Secondary | ICD-10-CM | POA: Diagnosis not present

## 2017-01-11 DIAGNOSIS — E119 Type 2 diabetes mellitus without complications: Secondary | ICD-10-CM | POA: Diagnosis not present

## 2017-01-11 DIAGNOSIS — Z7902 Long term (current) use of antithrombotics/antiplatelets: Secondary | ICD-10-CM

## 2017-01-11 DIAGNOSIS — Z85828 Personal history of other malignant neoplasm of skin: Secondary | ICD-10-CM

## 2017-01-11 DIAGNOSIS — I5032 Chronic diastolic (congestive) heart failure: Secondary | ICD-10-CM | POA: Diagnosis present

## 2017-01-11 DIAGNOSIS — I110 Hypertensive heart disease with heart failure: Secondary | ICD-10-CM | POA: Diagnosis not present

## 2017-01-11 DIAGNOSIS — I10 Essential (primary) hypertension: Secondary | ICD-10-CM | POA: Diagnosis not present

## 2017-01-11 DIAGNOSIS — N183 Chronic kidney disease, stage 3 unspecified: Secondary | ICD-10-CM | POA: Diagnosis present

## 2017-01-11 DIAGNOSIS — E1140 Type 2 diabetes mellitus with diabetic neuropathy, unspecified: Secondary | ICD-10-CM | POA: Diagnosis not present

## 2017-01-11 DIAGNOSIS — I5030 Unspecified diastolic (congestive) heart failure: Secondary | ICD-10-CM | POA: Diagnosis present

## 2017-01-11 DIAGNOSIS — I2510 Atherosclerotic heart disease of native coronary artery without angina pectoris: Secondary | ICD-10-CM

## 2017-01-11 DIAGNOSIS — I214 Non-ST elevation (NSTEMI) myocardial infarction: Secondary | ICD-10-CM | POA: Diagnosis not present

## 2017-01-11 DIAGNOSIS — Z794 Long term (current) use of insulin: Secondary | ICD-10-CM

## 2017-01-11 DIAGNOSIS — Z9981 Dependence on supplemental oxygen: Secondary | ICD-10-CM

## 2017-01-11 DIAGNOSIS — Z6841 Body Mass Index (BMI) 40.0 and over, adult: Secondary | ICD-10-CM

## 2017-01-11 DIAGNOSIS — E876 Hypokalemia: Secondary | ICD-10-CM | POA: Diagnosis present

## 2017-01-11 DIAGNOSIS — E785 Hyperlipidemia, unspecified: Secondary | ICD-10-CM | POA: Diagnosis not present

## 2017-01-11 DIAGNOSIS — Z79899 Other long term (current) drug therapy: Secondary | ICD-10-CM | POA: Diagnosis not present

## 2017-01-11 DIAGNOSIS — I25110 Atherosclerotic heart disease of native coronary artery with unstable angina pectoris: Secondary | ICD-10-CM | POA: Diagnosis not present

## 2017-01-11 DIAGNOSIS — R079 Chest pain, unspecified: Secondary | ICD-10-CM | POA: Diagnosis not present

## 2017-01-11 DIAGNOSIS — I25119 Atherosclerotic heart disease of native coronary artery with unspecified angina pectoris: Secondary | ICD-10-CM | POA: Diagnosis not present

## 2017-01-11 DIAGNOSIS — T82897A Other specified complication of cardiac prosthetic devices, implants and grafts, initial encounter: Secondary | ICD-10-CM | POA: Clinically undetermined

## 2017-01-11 DIAGNOSIS — E7800 Pure hypercholesterolemia, unspecified: Secondary | ICD-10-CM | POA: Diagnosis present

## 2017-01-11 DIAGNOSIS — J8410 Pulmonary fibrosis, unspecified: Secondary | ICD-10-CM | POA: Diagnosis present

## 2017-01-11 DIAGNOSIS — E1129 Type 2 diabetes mellitus with other diabetic kidney complication: Secondary | ICD-10-CM | POA: Diagnosis present

## 2017-01-11 DIAGNOSIS — I5033 Acute on chronic diastolic (congestive) heart failure: Secondary | ICD-10-CM | POA: Diagnosis present

## 2017-01-11 HISTORY — PX: LEFT HEART CATH AND CORONARY ANGIOGRAPHY: CATH118249

## 2017-01-11 LAB — PROTIME-INR
INR: 1.03
Prothrombin Time: 13.5 seconds (ref 11.4–15.2)

## 2017-01-11 LAB — BASIC METABOLIC PANEL
Anion gap: 8 (ref 5–15)
BUN: 27 mg/dL — ABNORMAL HIGH (ref 6–20)
CO2: 28 mmol/L (ref 22–32)
Calcium: 8.6 mg/dL — ABNORMAL LOW (ref 8.9–10.3)
Chloride: 104 mmol/L (ref 101–111)
Creatinine, Ser: 1.88 mg/dL — ABNORMAL HIGH (ref 0.61–1.24)
GFR calc Af Amer: 41 mL/min — ABNORMAL LOW (ref >60)
GFR calc non Af Amer: 35 mL/min — ABNORMAL LOW (ref >60)
Glucose, Bld: 165 mg/dL — ABNORMAL HIGH (ref 65–99)
Potassium: 3.3 mmol/L — ABNORMAL LOW (ref 3.5–5.1)
Sodium: 140 mmol/L (ref 135–145)

## 2017-01-11 LAB — CBC
HCT: 40.8 % (ref 40.0–52.0)
Hemoglobin: 13.6 g/dL (ref 13.0–18.0)
MCH: 28.5 pg (ref 26.0–34.0)
MCHC: 33.4 g/dL (ref 32.0–36.0)
MCV: 85.4 fL (ref 80.0–100.0)
Platelets: 185 10*3/uL (ref 150–440)
RBC: 4.78 MIL/uL (ref 4.40–5.90)
RDW: 15.6 % — ABNORMAL HIGH (ref 11.5–14.5)
WBC: 7.9 10*3/uL (ref 3.8–10.6)

## 2017-01-11 LAB — ECHOCARDIOGRAM COMPLETE
Height: 65 in
Weight: 4192 oz

## 2017-01-11 LAB — MAGNESIUM: Magnesium: 1.8 mg/dL (ref 1.7–2.4)

## 2017-01-11 LAB — MRSA PCR SCREENING: MRSA by PCR: NEGATIVE

## 2017-01-11 LAB — GLUCOSE, CAPILLARY
Glucose-Capillary: 169 mg/dL — ABNORMAL HIGH (ref 65–99)
Glucose-Capillary: 258 mg/dL — ABNORMAL HIGH (ref 65–99)

## 2017-01-11 LAB — TROPONIN I
Troponin I: 2.38 ng/mL (ref <0.03)
Troponin I: 10 ng/mL (ref <0.03)

## 2017-01-11 LAB — APTT: aPTT: 29 seconds (ref 24–36)

## 2017-01-11 SURGERY — LEFT HEART CATH AND CORONARY ANGIOGRAPHY
Anesthesia: Moderate Sedation

## 2017-01-11 MED ORDER — FENTANYL CITRATE (PF) 100 MCG/2ML IJ SOLN
INTRAMUSCULAR | Status: AC
  Filled 2017-01-11: qty 2

## 2017-01-11 MED ORDER — HYDRALAZINE HCL 20 MG/ML IJ SOLN
10.0000 mg | Freq: Four times a day (QID) | INTRAMUSCULAR | Status: DC | PRN
  Administered 2017-01-11 – 2017-01-12 (×2): 10 mg via INTRAVENOUS
  Filled 2017-01-11 (×2): qty 1

## 2017-01-11 MED ORDER — POTASSIUM CHLORIDE CRYS ER 20 MEQ PO TBCR
40.0000 meq | EXTENDED_RELEASE_TABLET | Freq: Once | ORAL | Status: AC
  Administered 2017-01-11: 40 meq via ORAL
  Filled 2017-01-11: qty 2

## 2017-01-11 MED ORDER — INSULIN ASPART 100 UNIT/ML ~~LOC~~ SOLN
0.0000 [IU] | Freq: Three times a day (TID) | SUBCUTANEOUS | Status: DC
  Administered 2017-01-11: 2 [IU] via SUBCUTANEOUS
  Administered 2017-01-12: 7 [IU] via SUBCUTANEOUS
  Administered 2017-01-12 (×2): 9 [IU] via SUBCUTANEOUS
  Administered 2017-01-13: 5 [IU] via SUBCUTANEOUS
  Administered 2017-01-13: 7 [IU] via SUBCUTANEOUS
  Filled 2017-01-11 (×6): qty 1

## 2017-01-11 MED ORDER — SODIUM CHLORIDE 0.9% FLUSH
3.0000 mL | Freq: Two times a day (BID) | INTRAVENOUS | Status: DC
  Administered 2017-01-11 – 2017-01-13 (×5): 3 mL via INTRAVENOUS

## 2017-01-11 MED ORDER — ISOSORBIDE MONONITRATE ER 30 MG PO TB24
60.0000 mg | ORAL_TABLET | Freq: Every day | ORAL | Status: DC
Start: 2017-01-11 — End: 2017-01-11

## 2017-01-11 MED ORDER — ONDANSETRON HCL 4 MG/2ML IJ SOLN
4.0000 mg | Freq: Four times a day (QID) | INTRAMUSCULAR | Status: DC | PRN
  Administered 2017-01-11 – 2017-01-12 (×2): 4 mg via INTRAVENOUS
  Filled 2017-01-11 (×2): qty 2

## 2017-01-11 MED ORDER — ASPIRIN 81 MG PO CHEW
324.0000 mg | CHEWABLE_TABLET | Freq: Once | ORAL | Status: AC
Start: 2017-01-11 — End: 2017-01-11
  Administered 2017-01-11: 324 mg via ORAL
  Filled 2017-01-11: qty 4

## 2017-01-11 MED ORDER — FUROSEMIDE 40 MG PO TABS: 40.0000 mg | ORAL_TABLET | Freq: Two times a day (BID) | ORAL | Status: DC

## 2017-01-11 MED ORDER — MORPHINE SULFATE (PF) 4 MG/ML IV SOLN
4.0000 mg | Freq: Once | INTRAVENOUS | Status: AC
  Administered 2017-01-11: 4 mg via INTRAVENOUS
  Filled 2017-01-11: qty 1

## 2017-01-11 MED ORDER — HYDRALAZINE HCL 50 MG PO TABS
50.0000 mg | ORAL_TABLET | Freq: Three times a day (TID) | ORAL | Status: DC
  Administered 2017-01-11 – 2017-01-12 (×5): 50 mg via ORAL
  Filled 2017-01-11 (×5): qty 1

## 2017-01-11 MED ORDER — TRIAMTERENE-HCTZ 37.5-25 MG PO TABS
1.0000 | ORAL_TABLET | Freq: Every day | ORAL | Status: DC
  Administered 2017-01-11 – 2017-01-13 (×3): 1 via ORAL
  Filled 2017-01-11 (×3): qty 1

## 2017-01-11 MED ORDER — IPRATROPIUM-ALBUTEROL 0.5-2.5 (3) MG/3ML IN SOLN
RESPIRATORY_TRACT | Status: AC
  Administered 2017-01-11: 3 mL via RESPIRATORY_TRACT
  Filled 2017-01-11: qty 3

## 2017-01-11 MED ORDER — TORSEMIDE 20 MG PO TABS
40.0000 mg | ORAL_TABLET | Freq: Every day | ORAL | Status: DC
Start: 2017-01-11 — End: 2017-01-11

## 2017-01-11 MED ORDER — HYDRALAZINE HCL 50 MG PO TABS
50.0000 mg | ORAL_TABLET | Freq: Two times a day (BID) | ORAL | Status: DC
Start: 2017-01-11 — End: 2017-01-11

## 2017-01-11 MED ORDER — MIDAZOLAM HCL 2 MG/2ML IJ SOLN
INTRAMUSCULAR | Status: AC
  Filled 2017-01-11: qty 2

## 2017-01-11 MED ORDER — VERAPAMIL HCL 2.5 MG/ML IV SOLN
INTRAVENOUS | Status: DC | PRN
Start: 2017-01-11 — End: 2017-01-11
  Administered 2017-01-11: 2.5 mg via INTRA_ARTERIAL

## 2017-01-11 MED ORDER — ATORVASTATIN CALCIUM 20 MG PO TABS
80.0000 mg | ORAL_TABLET | Freq: Every day | ORAL | Status: DC
  Administered 2017-01-11 – 2017-01-12 (×2): 80 mg via ORAL
  Filled 2017-01-11 (×2): qty 4

## 2017-01-11 MED ORDER — HEPARIN (PORCINE) IN NACL 100-0.45 UNIT/ML-% IJ SOLN
1000.0000 [IU]/h | INTRAMUSCULAR | Status: DC
  Administered 2017-01-11: 1000 [IU]/h via INTRAVENOUS
  Filled 2017-01-11: qty 250

## 2017-01-11 MED ORDER — SODIUM CHLORIDE 0.9% FLUSH: 3.0000 mL | INTRAVENOUS | Status: DC | PRN

## 2017-01-11 MED ORDER — HEPARIN SODIUM (PORCINE) 1000 UNIT/ML IJ SOLN
INTRAMUSCULAR | Status: DC | PRN
  Administered 2017-01-11: 5000 [IU] via INTRAVENOUS

## 2017-01-11 MED ORDER — ASPIRIN EC 81 MG PO TBEC
81.0000 mg | DELAYED_RELEASE_TABLET | Freq: Every day | ORAL | Status: DC
  Administered 2017-01-12 – 2017-01-13 (×2): 81 mg via ORAL
  Filled 2017-01-11 (×2): qty 1

## 2017-01-11 MED ORDER — NITROGLYCERIN 0.4 MG SL SUBL: 0.4000 mg | SUBLINGUAL_TABLET | SUBLINGUAL | Status: DC | PRN

## 2017-01-11 MED ORDER — PANTOPRAZOLE SODIUM 40 MG PO TBEC: 40.0000 mg | DELAYED_RELEASE_TABLET | Freq: Every day | ORAL | Status: DC

## 2017-01-11 MED ORDER — CLOPIDOGREL BISULFATE 75 MG PO TABS
75.0000 mg | ORAL_TABLET | Freq: Every day | ORAL | Status: DC
  Administered 2017-01-11 – 2017-01-13 (×3): 75 mg via ORAL
  Filled 2017-01-11 (×3): qty 1

## 2017-01-11 MED ORDER — SODIUM CHLORIDE 0.9% FLUSH: 3.0000 mL | Freq: Two times a day (BID) | INTRAVENOUS | Status: DC

## 2017-01-11 MED ORDER — ZOLPIDEM TARTRATE 5 MG PO TABS: 5.0000 mg | ORAL_TABLET | Freq: Every evening | ORAL | Status: DC | PRN

## 2017-01-11 MED ORDER — CLONIDINE HCL 0.1 MG PO TABS: 0.3000 mg | ORAL_TABLET | Freq: Three times a day (TID) | ORAL | Status: DC

## 2017-01-11 MED ORDER — ONDANSETRON HCL 4 MG/2ML IJ SOLN
4.0000 mg | Freq: Once | INTRAMUSCULAR | Status: AC
  Administered 2017-01-11: 4 mg via INTRAVENOUS
  Filled 2017-01-11: qty 2

## 2017-01-11 MED ORDER — FUROSEMIDE 10 MG/ML IJ SOLN
40.0000 mg | Freq: Two times a day (BID) | INTRAMUSCULAR | Status: DC
  Administered 2017-01-11 – 2017-01-12 (×3): 40 mg via INTRAVENOUS
  Filled 2017-01-11 (×3): qty 4

## 2017-01-11 MED ORDER — VERAPAMIL HCL 2.5 MG/ML IV SOLN
INTRAVENOUS | Status: AC
  Filled 2017-01-11: qty 2

## 2017-01-11 MED ORDER — HEPARIN SODIUM (PORCINE) 5000 UNIT/ML IJ SOLN
4000.0000 [IU] | Freq: Once | INTRAMUSCULAR | Status: AC
  Administered 2017-01-11: 4000 [IU] via INTRAVENOUS
  Filled 2017-01-11: qty 1

## 2017-01-11 MED ORDER — SODIUM CHLORIDE 0.9 % IV BOLUS (SEPSIS)
1000.0000 mL | INTRAVENOUS | Status: AC
  Administered 2017-01-11: 1000 mL via INTRAVENOUS

## 2017-01-11 MED ORDER — SODIUM CHLORIDE 0.9 % WEIGHT BASED INFUSION: 3.0000 mL/kg/h | INTRAVENOUS | Status: DC

## 2017-01-11 MED ORDER — CLONIDINE HCL 0.1 MG PO TABS
0.3000 mg | ORAL_TABLET | Freq: Three times a day (TID) | ORAL | Status: DC
  Administered 2017-01-11 – 2017-01-13 (×6): 0.3 mg via ORAL
  Filled 2017-01-11 (×6): qty 3

## 2017-01-11 MED ORDER — SODIUM CHLORIDE 0.9 % IV SOLN: 250.0000 mL | INTRAVENOUS | Status: DC | PRN

## 2017-01-11 MED ORDER — OXYCODONE HCL 5 MG PO TABS
15.0000 mg | ORAL_TABLET | Freq: Every evening | ORAL | Status: DC
  Administered 2017-01-11 – 2017-01-12 (×2): 15 mg via ORAL
  Filled 2017-01-11 (×2): qty 3

## 2017-01-11 MED ORDER — ASPIRIN 81 MG PO CHEW: 81.0000 mg | CHEWABLE_TABLET | ORAL | Status: DC

## 2017-01-11 MED ORDER — ENOXAPARIN SODIUM 40 MG/0.4ML ~~LOC~~ SOLN
40.0000 mg | SUBCUTANEOUS | Status: DC
  Administered 2017-01-12 – 2017-01-13 (×2): 40 mg via SUBCUTANEOUS
  Filled 2017-01-11 (×2): qty 0.4

## 2017-01-11 MED ORDER — IPRATROPIUM-ALBUTEROL 0.5-2.5 (3) MG/3ML IN SOLN
3.0000 mL | Freq: Four times a day (QID) | RESPIRATORY_TRACT | Status: DC
  Administered 2017-01-11 – 2017-01-12 (×4): 3 mL via RESPIRATORY_TRACT
  Filled 2017-01-11 (×3): qty 3

## 2017-01-11 MED ORDER — HEPARIN (PORCINE) IN NACL 2-0.9 UNIT/ML-% IJ SOLN
INTRAMUSCULAR | Status: AC
  Filled 2017-01-11: qty 500

## 2017-01-11 MED ORDER — CARVEDILOL 25 MG PO TABS
25.0000 mg | ORAL_TABLET | Freq: Two times a day (BID) | ORAL | Status: DC
  Administered 2017-01-11 – 2017-01-13 (×4): 25 mg via ORAL
  Filled 2017-01-11 (×4): qty 1

## 2017-01-11 MED ORDER — MIDAZOLAM HCL 2 MG/2ML IJ SOLN
INTRAMUSCULAR | Status: DC | PRN
  Administered 2017-01-11: 1 mg via INTRAVENOUS

## 2017-01-11 MED ORDER — FUROSEMIDE 10 MG/ML IJ SOLN
INTRAMUSCULAR | Status: AC
  Administered 2017-01-11: 40 mg
  Filled 2017-01-11: qty 4

## 2017-01-11 MED ORDER — IOPAMIDOL (ISOVUE-300) INJECTION 61%
INTRAVENOUS | Status: DC | PRN
  Administered 2017-01-11: 70 mL via INTRA_ARTERIAL

## 2017-01-11 MED ORDER — MORPHINE SULFATE (PF) 2 MG/ML IV SOLN
2.0000 mg | INTRAVENOUS | Status: DC | PRN
  Administered 2017-01-11 (×3): 2 mg via INTRAVENOUS
  Filled 2017-01-11 (×2): qty 1

## 2017-01-11 MED ORDER — INSULIN ASPART 100 UNIT/ML ~~LOC~~ SOLN
0.0000 [IU] | Freq: Every day | SUBCUTANEOUS | Status: DC
  Administered 2017-01-11: 3 [IU] via SUBCUTANEOUS
  Administered 2017-01-12: 4 [IU] via SUBCUTANEOUS
  Filled 2017-01-11 (×2): qty 1

## 2017-01-11 MED ORDER — SODIUM CHLORIDE 0.9 % WEIGHT BASED INFUSION: 1.0000 mL/kg/h | INTRAVENOUS | Status: DC

## 2017-01-11 MED ORDER — NITROGLYCERIN IN D5W 200-5 MCG/ML-% IV SOLN
INTRAVENOUS | Status: AC
  Administered 2017-01-11: 10 ug/min via INTRAVENOUS
  Filled 2017-01-11: qty 250

## 2017-01-11 MED ORDER — MORPHINE SULFATE (PF) 2 MG/ML IV SOLN
INTRAVENOUS | Status: AC
  Administered 2017-01-11: 2 mg via INTRAVENOUS
  Filled 2017-01-11: qty 1

## 2017-01-11 MED ORDER — HEPARIN BOLUS VIA INFUSION
4000.0000 [IU] | Freq: Once | INTRAVENOUS | Status: DC
  Filled 2017-01-11: qty 4000

## 2017-01-11 MED ORDER — SODIUM CHLORIDE 0.9% FLUSH
3.0000 mL | INTRAVENOUS | Status: DC | PRN
  Administered 2017-01-11: 3 mL via INTRAVENOUS
  Filled 2017-01-11: qty 3

## 2017-01-11 MED ORDER — ACETAMINOPHEN 325 MG PO TABS: 650.0000 mg | ORAL_TABLET | ORAL | Status: DC | PRN

## 2017-01-11 MED ORDER — ALPRAZOLAM 0.25 MG PO TABS: 0.2500 mg | ORAL_TABLET | Freq: Two times a day (BID) | ORAL | Status: DC | PRN

## 2017-01-11 MED ORDER — NITROGLYCERIN IN D5W 200-5 MCG/ML-% IV SOLN
0.0000 ug/min | INTRAVENOUS | Status: DC
  Administered 2017-01-11: 30 ug/min via INTRAVENOUS
  Administered 2017-01-11: 10 ug/min via INTRAVENOUS
  Administered 2017-01-11: 25 ug/min via INTRAVENOUS
  Filled 2017-01-11: qty 250

## 2017-01-11 MED ORDER — HEPARIN SODIUM (PORCINE) 1000 UNIT/ML IJ SOLN
INTRAMUSCULAR | Status: AC
  Filled 2017-01-11: qty 1

## 2017-01-11 MED ORDER — ISOSORBIDE MONONITRATE ER 30 MG PO TB24
60.0000 mg | ORAL_TABLET | Freq: Two times a day (BID) | ORAL | Status: DC
  Administered 2017-01-11: 60 mg via ORAL
  Filled 2017-01-11: qty 2

## 2017-01-11 SURGICAL SUPPLY — 6 items
CATH OPTITORQUE JACKY 4.0 5F (CATHETERS) ×2 IMPLANT
DEVICE RAD COMP TR BAND LRG (VASCULAR PRODUCTS) ×2 IMPLANT
GLIDESHEATH SLEND SS 6F .021 (SHEATH) ×2 IMPLANT
KIT MANI 3VAL PERCEP (MISCELLANEOUS) ×2 IMPLANT
PACK CARDIAC CATH (CUSTOM PROCEDURE TRAY) ×2 IMPLANT
WIRE ROSEN-J .035X260CM (WIRE) ×2 IMPLANT

## 2017-01-11 NOTE — Consult Note (Signed)
Cardiology Consultation Note  Patient ID: Stephen [name], MRN: [id number], DOB/AGE: 12-15-1945 71 y.o. Admit date: 01/11/2017   Date of Consult: 01/11/2017 Primary Physician: [name], MD Primary Cardiologist: Dr. Rockey Situ, MD Requesting Physician: Dr. Bridgett Larsson, MD  Chief Complaint: Chest pain Reason for Consult: NSTEMI  HPI: Stephen [name] is a 71 y.o. male who is being seen today for the evaluation of NSTEMI at the request of Dr. Bridgett Larsson, MD. Patient has a h/o CAD s/p prior MI and PCI/stenting to the mid LCx and mid RCA in 04/2006, interstitial fibrosis s/p wedge resection, on chronic oxygen, CKD stage III, HTN, HLD, DM2, and remote tobacco abuse who presented to [facility] with chest pain and was found to have a NSTEMI.   Most recent ischemic evaluation: Dobutamine stress test July 2011 that showed no significant ischemia, inadequate heart rate was achieved. Prior right and left heart catheterization January 2011 showed 50% mid LAD disease, 60% at the ostium of the diagonal #2, 30% proximal left circumflex disease, patent stent in the left circumflex, patent stent of the mid RCA, wedge pressure of 16, PA pressure mean 22. Prior echo in 03/2009 showed normal systolic function, DD, normal RV function.  Patient has been noting ~ 3-4 weeks of chest pain and SOB. Pain has not been associated with exertion. Pain initially would last only a couple of minutes and self resolve with resting. However, over the past week his pain has become more frequent, severe, and longer lasting. He was seen in our office by Dr. Rockey Situ on 7/6 with this complaint and felt he needed diuresis. He called back as advised with continued chest pain and was scheduled for Lexsican Myoview on 7/13. Unfortunately, on the night of 7/11 around 10:30 PM the patient developed sudden onset of severe 8/10 chest pain that did not improve with SL NTG x 3. He had this pain all night, which has also persisted into this morning. There has been some  associated SOB, diaphoresis, and nausea. Pain feels similar to his prior MIs. He has not missed any doses of his ASA 325 mg or Plavix 75 mg.   Upon the patient's arrival to [facility] they were found to have BP 180/76, HR 62 bpm, temp 97.9, oxygen saturation 94% on room air, weight 262 pounds. EKG showed NSR, 63 bpm, left axis deviation, LVH, poor R wave progression, mild ST elevation in aVR, TWI I aVL, horizontal st depression leads II, V5-V6, CXR showed moderate vascular congestion without overt pulmonary edema. Labs showed initial troponin of 2.38, SCr 1.88 (baseline ~ 1.8-2.0), K+ 3.3, glucose 165, WBC 7.9, HGB 13.6, PLT 185. In the ED he was given ASA 324 mg x 1 and IV morphine without relief of his chest pain. He had previously taken SL NTG x 3 at home prior to arrival. He continues to note 8/10 substernal chest pain/pressure that now radiates to his left shoulder. Some associated diaphoresis, SOB, and nausea.   Past Medical History:  Diagnosis Date  . Adrenal gland anomaly   . Arthritis   . CAD (coronary artery disease)    s/p MI x 3 now with 2 stents  . CHF (congestive heart failure) ([place])   . Colon polyps   . Diabetes mellitus with neuropathy ([place])   . DM2 (diabetes mellitus, type 2) (HCC)    insulin requiring  . Dyslipidemia   . GERD (gastroesophageal reflux disease)   . Heart disease   . High cholesterol   . HTN (hypertension)   .  Kidney disease   . Kidney stones   . Metabolic syndrome   . OSA (obstructive sleep apnea)   . 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 Recurrent UTI   . Renal artery stenosis (HCC)    L sided, stent 07/2013  . Rotator cuff disorder    right  . Skin cancer    head  . Thyroid disorder       Most Recent Cardiac Studies: [facility] 07/2009: 50% mid LAD disease, 60% at the ostium of the diagonal #2, 30% proximal left circumflex disease, patent stent in the left circumflex, patent stent  of the mid RCA, wedge pressure of 16, PA pressure mean 22   Surgical History:  Past Surgical History:  Procedure Laterality Date  . adrenal adenoma removal  1990-   right  . CARPAL TUNNEL RELEASE     bilateral  . CORONARY ANGIOPLASTY WITH STENT PLACEMENT  2007   x 2  . KNEE SURGERY     right  . LUNG BIOPSY    . RENAL ARTERY STENT  2015   L  . SKIN CANCER EXCISION     back of head     Home Meds: Prior to Admission medications   Medication Sig Start Date End Date Taking? Authorizing Provider  ACCU-CHEK SOFTCLIX LANCETS lancets Use as instructed to check blood sugar three times daily and as needed.  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 03/09/16   [name], MD  aspirin 81 MG tablet Take 81 mg by mouth 2 (two) times daily.      [provider]  atorvastatin (LIPITOR) 80 MG tablet TAKE 1 TABLET AT BEDTIME 02/03/16   [name], MD  Blood Glucose Monitoring Suppl (ACCU-CHEK AVIVA PLUS) w/Device KIT Use to check blood sugar three times daily and as needed.  Diagnosis: E11.22  Insulin-dependent 03/02/16   [name], MD  carvedilol (COREG) 25 MG tablet TAKE 1 TABLET TWICE DAILY WITH A MEAL 02/03/16   [name], MD  cloNIDine (CATAPRES) 0.3 MG tablet TAKE 1 TABLET THREE TIMES DAILY 10/27/15   Minna Merritts, MD  clopidogrel (PLAVIX) 75 MG tablet Take 1 tablet (75 mg total) by mouth daily. 08/07/16   Minna Merritts, MD  fluticasone Asencion Islam) 50 MCG/ACT nasal spray  08/14/16   [provider]  gabapentin (NEURONTIN) 800 MG tablet TAKE 1 TABLET IN THE MORNING  AND TAKE 1 AND 1/2 TABLETS AT BEDTIME 10/25/16   [name], MD  glucose blood (ACCU-CHEK AVIVA PLUS) test strip Use as instructed to check blood sugar 3 times daily or as needed.  Diagnosis:  E11.22  Insulin-dependent. 03/02/16   [name], MD  hydrALAZINE (APRESOLINE) 50 MG tablet Take 1 tablet (50 mg total) by mouth 3 (three) times daily. 06/06/16   Minna Merritts, MD  insulin  NPH-regular Human (NOVOLIN 70/30) (70-30) 100 UNIT/ML injection INJECT  70 UNITS SUBCUTANEOUSLY EVERY MORNING  AND  35-45 UNITS IN THE EVENING 09/30/14   [name], MD  Insulin Syringe-Needle U-100 (INSULIN SYRINGE 1CC/30GX5/16") 30G X 5/16" 1 ML MISC Use as directed to take insulin Dx 250.62 07/14/13   Ria Bush, MD  isosorbide mononitrate (IMDUR) 60 MG 24 hr tablet Take 1 tablet (60 mg total) by mouth 2 (two) times daily. 10/24/16   Minna Merritts, MD  nitroGLYCERIN (NITROSTAT) 0.4 MG SL tablet Place 1 tablet (0.4 mg total) under  the tongue every 5 (five) minutes as needed. May repeat x3 12/16/13   [name], MD  NON FORMULARY Oxygen 2 liters 24/7    [provider]  omeprazole (PRILOSEC) 20 MG capsule Take 20 mg by mouth daily.      [provider]  oxyCODONE (ROXICODONE) 15 MG immediate release tablet Take 1 tablet (15 mg total) by mouth every evening. 12/07/16   [name], MD  torsemide (DEMADEX) 20 MG tablet Take 2 tablets (40 mg total) by mouth daily. 01/05/17   Minna Merritts, MD  triamterene-hydrochlorothiazide (MAXZIDE-25) 37.5-25 MG tablet Take 1 tablet by mouth daily. 11/04/15   Minna Merritts, MD    Inpatient Medications:  . heparin  4,000 Units Intravenous Once   . heparin    . sodium chloride      Allergies:  Allergies  Allergen Reactions  . Calcium Channel Blockers     Would avoid if possible due to h/o peripheral edema    Social History   Social History  . Marital status: Married    Spouse name: N/A  . Number of children: 2  . Years of education: N/A   Occupational History  . 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  Disabled   Social History Main Topics  . Smoking status: Never Smoker  . Smokeless tobacco: Never Used  . Alcohol use No  . Drug use: No  . Sexual activity: Not on file   Other Topics Concern  . Not on file    Social History Narrative   Enjoys fishing   [employer] fan     Family History  Problem Relation Age of Onset  . Heart attack Father   . Heart attack Mother   . Heart attack Brother   . COPD Sister   . Arthritis Maternal Grandmother   . Breast cancer Maternal Aunt   . Diabetes Maternal Aunt   . Hypertension Other   . Hypertension Maternal Aunt      Review of Systems: Review of Systems  Constitutional: Positive for diaphoresis and malaise/fatigue. Negative for chills, fever and weight loss.  HENT: Negative for congestion.   Eyes: Negative for discharge and redness.  Respiratory: Positive for shortness of breath. Negative for cough, hemoptysis, sputum production and wheezing.   Cardiovascular: Positive for chest pain. Negative for palpitations, orthopnea, claudication, leg swelling and PND.  Gastrointestinal: Positive for nausea. Negative for abdominal pain, blood in stool, heartburn, melena and vomiting.  Genitourinary: Negative for hematuria.  Musculoskeletal: Negative for falls and myalgias.  Skin: Negative for rash.  Neurological: Positive for weakness. Negative for dizziness, tingling, tremors, sensory change, speech change, focal weakness and loss of consciousness.  Endo/Heme/Allergies: Does not bruise/bleed easily.  Psychiatric/Behavioral: Negative for substance abuse. The patient is not nervous/anxious.   All other systems reviewed and are negative.   Labs:  Recent Labs  01/11/17 0912  TROPONINI 2.38*   Lab Results  Component Value Date   WBC 7.9 01/11/2017   HGB 13.6 01/11/2017   HCT 40.8 01/11/2017   MCV 85.4 01/11/2017   PLT 185 01/11/2017     Recent Labs Lab 01/11/17 0912  NA 140  K 3.3*  CL 104  CO2 28  BUN 27*  CREATININE 1.88*  CALCIUM 8.6*  GLUCOSE 165*   Lab Results  Component Value Date   CHOL 122 02/18/2016   HDL 27.20 (L) 02/18/2016  [place] 75 02/18/2016   TRIG 102.0 02/18/2016   No results found for:  DDIMER  Radiology/Studies:  Dg Chest 2 View  Result Date: 01/11/2017 CLINICAL DATA:  Chest pain and shortness of breath for 1 month. EXAM: CHEST  2 VIEW COMPARISON:  04/24/2013 FINDINGS: The heart is upper limits of normal in size and stable. Mild tortuosity and calcification of the thoracic aorta. Stable eventration of both hemidiaphragms, right greater than left. Moderate vascular congestion without overt pulmonary edema. Streaky areas of subsegmental atelectasis and scarring changes. No definite infiltrates or effusions. The bony thorax is intact. Suspect changes of DISH. Advanced degenerative changes involving the right shoulder. IMPRESSION: Moderate vascular congestion without overt pulmonary edema. No infiltrates or effusions. Electronically Signed   By: Marijo Sanes M.D.   On: 01/11/2017 09:42    EKG: Interpreted by me showed: NSR, 63 bpm, left axis deviation, LVH, poor R wave progression, mild ST elevation in aVR, TWI I aVL, horizontal st depression leads II, V5-V6 Telemetry: Interpreted by me showed: NSR, 80s bpm  Weights: Filed Weights   01/11/17 0911  Weight: 262 lb (118.8 kg)     Physical Exam: Blood pressure (!) 180/103, pulse 62, temperature 97.9 F (36.6 C), temperature source Oral, resp. rate 14, height 5' 5" (1.651 m), weight 262 lb (118.8 kg), SpO2 95 %. Body mass index is 43.6 kg/m. General: Well developed, well nourished, in no mild distress. Head: Normocephalic, atraumatic, sclera non-icteric, no xanthomas, nares are without discharge.  Neck: Negative for carotid bruits. JVD not elevated. Lungs: Clear bilaterally to auscultation without wheezes, rales, or rhonchi. Breathing is unlabored. Heart: RRR with S1 S2. No murmurs, rubs, or gallops appreciated. Abdomen: Soft, non-tender, non-distended with normoactive bowel sounds. No hepatomegaly. No rebound/guarding. No obvious abdominal masses. Msk:  Strength and tone appear normal for age. Extremities: No clubbing or  cyanosis. Trace pre-tibial edema. Distal pedal pulses are 2+ and equal bilaterally. Neuro: Alert and oriented X 3. No facial asymmetry. No focal deficit. Moves all extremities spontaneously. Psych:  Responds to questions appropriately with a normal affect.    Assessment and Plan:  Principal Problem:   NSTEMI (non-ST elevated myocardial infarction) ([place]) Active Problems:   CAD, NATIVE VESSEL   Type 2 diabetes mellitus with renal complication (HCC)   Chronic kidney disease, stage III (moderate)   PULMONARY FIBROSIS    1. NSTEMI: -Continues to note 8/10 substernal chest pain -Discussed with Dr. Fletcher Anon, MD via phone -Will schedule for LHC today -Give IV fluids pre-cath given CKD stage III -Has already received ASA 324 mg x 1 in the ED -Heparin bolus and infusion -PRN morphine and SL NTG -Check TTE post intervention  -He last took full dose ASA and Plavix on the morning of 7/11 -Lipitor  2. CKD stage III: -As above -IV fluid bolus pre-cath -Limit IV contrast -Will need to monitor SCr closely following cath  3. HTN: -SL NTG and morphine as above -Hold torsemide and Maxzide pre-cath given renal function -Continue Coreg, Imdur and hydralazine   4. DM2: -Per IM  5. Pulmonary fibrosis: -Stable  6. Chronic diastolic CHF: -TTE pending as above -Await LHC   7. HLD: -Lipitor -Check lipid panel   Signed, Christell Faith, PA-C [facility] Pager: [phone] 01/11/2017, 11:11 AM

## 2017-01-11 NOTE — Progress Notes (Signed)
*  PRELIMINARY RESULTS* Echocardiogram 2D Echocardiogram has been performed.  Stephen [name] 01/11/2017, 2:25 PM

## 2017-01-11 NOTE — H&P (Signed)
Stephen [name] at [place] NAME: Stephen [name]    MR#:  [id number]  DATE OF BIRTH:  1945-07-23  DATE OF ADMISSION:  01/11/2017  PRIMARY CARE PHYSICIAN: [name], MD   REQUESTING/REFERRING PHYSICIAN: Harvest Dark, MD  CHIEF COMPLAINT:   Chief Complaint  Patient presents with  . Chest Pain   Chest pain since last night. HISTORY OF PRESENT ILLNESS:  Stephen [name]  is a 71 y.o. male with a known history of CAD with 2 stent, hypertension, diabetes, CHF, CKD and OSA. The patient is started to have chest pain since last night, which is substernal area, constant, tight and sharp without radiation. He also complains of nausea and diaphoresis. The chest pain is worse this morning and not relieved by nitroglycerin. Troponin is elevated at 2.3. ED physician start heparin drip and called cardiologist for possible cardiac catheter. PAST MEDICAL HISTORY:   Past Medical History:  Diagnosis Date  . Adrenal gland anomaly   . Arthritis   . CAD (coronary artery disease)    s/p MI x 3 now with 2 stents  . CHF (congestive heart failure) ([place])   . Colon polyps   . Diabetes mellitus with neuropathy ([place])   . DM2 (diabetes mellitus, type 2) (HCC)    insulin requiring  . Dyslipidemia   . GERD (gastroesophageal reflux disease)   . Heart disease   . High cholesterol   . HTN (hypertension)   . Kidney disease   . Kidney stones   . Metabolic syndrome   . OSA (obstructive sleep apnea)   . 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 Recurrent UTI   . Renal artery stenosis (HCC)    L sided, stent 07/2013  . Rotator cuff disorder    right  . Skin cancer    head  . Thyroid disorder     PAST SURGICAL HISTORY:   Past Surgical History:  Procedure Laterality Date  . adrenal adenoma removal  1990-   right  . CARPAL TUNNEL RELEASE     bilateral  . CORONARY ANGIOPLASTY  WITH STENT PLACEMENT  2007   x 2  . KNEE SURGERY     right  . LUNG BIOPSY    . RENAL ARTERY STENT  2015   L  . SKIN CANCER EXCISION     back of head    SOCIAL HISTORY:   Social History  Substance Use Topics  . Smoking status: Never Smoker  . Smokeless tobacco: Never Used  . Alcohol use No    FAMILY HISTORY:   Family History  Problem Relation Age of Onset  . Heart attack Father   . Heart attack Mother   . Heart attack Brother   . COPD Sister   . Arthritis Maternal Grandmother   . Breast cancer Maternal Aunt   . Diabetes Maternal Aunt   . Hypertension Other   . Hypertension Maternal Aunt     DRUG ALLERGIES:   Allergies  Allergen Reactions  . Calcium Channel Blockers     Would avoid if possible due to h/o peripheral edema    REVIEW OF SYSTEMS:   Review of Systems  Constitutional: Positive for diaphoresis and malaise/fatigue. Negative for chills and fever.  HENT: Negative for sore throat.   Eyes: Negative for blurred vision and double vision.  Respiratory: Negative for cough, shortness of breath, wheezing and stridor.  Cardiovascular: Positive for chest pain. Negative for palpitations and leg swelling.  Gastrointestinal: Positive for nausea. Negative for abdominal pain, blood in stool, diarrhea, melena and vomiting.  Genitourinary: Negative for dysuria and hematuria.  Musculoskeletal: Negative for back pain.  Skin: Negative for itching and rash.  Neurological: Positive for weakness. Negative for dizziness, focal weakness, loss of consciousness and headaches.  Psychiatric/Behavioral: Negative for depression. The patient is not nervous/anxious.     MEDICATIONS AT HOME:   Prior to Admission medications   Medication Sig Start Date End Date Taking? Authorizing Provider  ACCU-CHEK SOFTCLIX LANCETS lancets Use as instructed to check blood sugar three times daily and as needed.  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 03/09/16   [name], MD  aspirin 81 MG  tablet Take 81 mg by mouth 2 (two) times daily.      [provider]  atorvastatin (LIPITOR) 80 MG tablet TAKE 1 TABLET AT BEDTIME 02/03/16   [name], MD  Blood Glucose Monitoring Suppl (ACCU-CHEK AVIVA PLUS) w/Device KIT Use to check blood sugar three times daily and as needed.  Diagnosis: E11.22  Insulin-dependent 03/02/16   [name], MD  carvedilol (COREG) 25 MG tablet TAKE 1 TABLET TWICE DAILY WITH A MEAL 02/03/16   [name], MD  cloNIDine (CATAPRES) 0.3 MG tablet TAKE 1 TABLET THREE TIMES DAILY 10/27/15   Minna Merritts, MD  clopidogrel (PLAVIX) 75 MG tablet Take 1 tablet (75 mg total) by mouth daily. 08/07/16   Minna Merritts, MD  fluticasone Asencion Islam) 50 MCG/ACT nasal spray  08/14/16   [provider]  gabapentin (NEURONTIN) 800 MG tablet TAKE 1 TABLET IN THE MORNING  AND TAKE 1 AND 1/2 TABLETS AT BEDTIME 10/25/16   [name], MD  glucose blood (ACCU-CHEK AVIVA PLUS) test strip Use as instructed to check blood sugar 3 times daily or as needed.  Diagnosis:  E11.22  Insulin-dependent. 03/02/16   [name], MD  hydrALAZINE (APRESOLINE) 50 MG tablet Take 1 tablet (50 mg total) by mouth 3 (three) times daily. 06/06/16   Minna Merritts, MD  insulin NPH-regular Human (NOVOLIN 70/30) (70-30) 100 UNIT/ML injection INJECT  70 UNITS SUBCUTANEOUSLY EVERY MORNING  AND  35-45 UNITS IN THE EVENING 09/30/14   [name], MD  Insulin Syringe-Needle U-100 (INSULIN SYRINGE 1CC/30GX5/16") 30G X 5/16" 1 ML MISC Use as directed to take insulin Dx 250.62 07/14/13   Ria Bush, MD  isosorbide mononitrate (IMDUR) 60 MG 24 hr tablet Take 1 tablet (60 mg total) by mouth 2 (two) times daily. 10/24/16   Minna Merritts, MD  nitroGLYCERIN (NITROSTAT) 0.4 MG SL tablet Place 1 tablet (0.4 mg total) under the tongue every 5 (five) minutes as needed. May repeat x3 12/16/13   [name], MD  NON FORMULARY Oxygen 2 liters 24/7    [provider]    omeprazole (PRILOSEC) 20 MG capsule Take 20 mg by mouth daily.      [provider]  oxyCODONE (ROXICODONE) 15 MG immediate release tablet Take 1 tablet (15 mg total) by mouth every evening. 12/07/16   [name], MD  torsemide (DEMADEX) 20 MG tablet Take 2 tablets (40 mg total) by mouth daily. 01/05/17   Minna Merritts, MD  triamterene-hydrochlorothiazide (MAXZIDE-25) 37.5-25 MG tablet Take 1 tablet by mouth daily. 11/04/15   Minna Merritts, MD      VITAL SIGNS:  Blood pressure (!) 180/103, pulse 62, temperature 97.9 F (  36.6 C), temperature source Oral, resp. rate 14, height '5\' 5"'$  (1.651 m), weight 262 lb (118.8 kg), SpO2 95 %.  PHYSICAL EXAMINATION:  Physical Exam  GENERAL:  71 y.o.-year-old patient lying in the bed with no acute distress. Morbidly obese. EYES: Pupils equal, round, reactive to light and accommodation. No scleral icterus. Extraocular muscles intact.  HEENT: Head atraumatic, normocephalic. Oropharynx and nasopharynx clear.  NECK:  Supple, no jugular venous distention. No thyroid enlargement, no tenderness.  LUNGS: Normal breath sounds bilaterally, no wheezing, rales,rhonchi or crepitation. No use of accessory muscles of respiration.  CARDIOVASCULAR: S1, S2 normal. No murmurs, rubs, or gallops.  ABDOMEN: Soft, nontender, nondistended. Bowel sounds present. No organomegaly or mass.  EXTREMITIES: No pedal edema, cyanosis, or clubbing.  NEUROLOGIC: Cranial nerves II through XII are intact. Muscle strength 5/5 in all extremities. Sensation intact. Gait not checked.  PSYCHIATRIC: The patient is alert and oriented x 3.  SKIN: No obvious rash, lesion, or ulcer.   LABORATORY PANEL:   CBC  Recent Labs Lab 01/11/17 0912  WBC 7.9  HGB 13.6  HCT 40.8  PLT 185   ------------------------------------------------------------------------------------------------------------------  Chemistries   Recent Labs Lab 01/11/17 0912  NA 140  K 3.3*  CL 104  CO2  28  GLUCOSE 165*  BUN 27*  CREATININE 1.88*  CALCIUM 8.6*   ------------------------------------------------------------------------------------------------------------------  Cardiac Enzymes  Recent Labs Lab 01/11/17 0912  TROPONINI 2.38*   ------------------------------------------------------------------------------------------------------------------  RADIOLOGY:  Dg Chest 2 View  Result Date: 01/11/2017 CLINICAL DATA:  Chest pain and shortness of breath for 1 month. EXAM: CHEST  2 VIEW COMPARISON:  04/24/2013 FINDINGS: The heart is upper limits of normal in size and stable. Mild tortuosity and calcification of the thoracic aorta. Stable eventration of both hemidiaphragms, right greater than left. Moderate vascular congestion without overt pulmonary edema. Streaky areas of subsegmental atelectasis and scarring changes. No definite infiltrates or effusions. The bony thorax is intact. Suspect changes of DISH. Advanced degenerative changes involving the right shoulder. IMPRESSION: Moderate vascular congestion without overt pulmonary edema. No infiltrates or effusions. Electronically Signed   By: Marijo Sanes M.D.   On: 01/11/2017 09:42      IMPRESSION AND PLAN:    71 years old Caucasian male presented to the ED with chest pain with elevated troponin.  Non-STEMI. The patient will be admitted to medical floor withtelemetry monitor. Continue heparin drip, aspirin, Plavix and Lipitor. Nothing by mouth except meds, Follow-up with Dr.  Fletcher Anon for cardiac catheter today.  Hypertension. Accelerated. Continue hypertension medication, IV hydralazine when necessary.  Hypokalemia. Give potassium supplement, follow-up potassium and magnesium level.  CKD stage III. Stable. Start normal saline IV for cardiac catheter. Follow-up BMP.  Diabetes. Start sliding scale. Consult diabetes coordinator for inpatient Lantus dosing. Morbid obesity and OSA. CPAP at nigh.  Discussed with the  cardiology PA. All the records are reviewed and case discussed with ED provider. Management plans discussed with the patient, his wife and they are in agreement.  CODE STATUS: Full code  TOTAL TIME TAKING CARE OF THIS PATIENT: 56 minutes.    Demetrios Loll M.D on 01/11/2017 at 11:21 AM  Between 7am to 6pm - Pager - [phone]  After 6pm go to [email or web address] - password [email or web address] [facility]  [employer] [facility]  Office  [phone]  CC: Primary care physician; [name], MD   Note: This dictation was prepared with Dragon dictation along with smaller phrase technology. Any transcriptional errors that result from this process are unintentional.

## 2017-01-11 NOTE — ED Provider Notes (Signed)
[facility] Emergency Department Provider Note  Time seen: 10:19 AM  I have reviewed the triage vital signs and the nursing notes.   HISTORY  Chief Complaint Chest Pain    HPI RODERT [name] is a 71 y.o. male with a past medical history of CAD, 2 stents, diabetes, hypertension, hyperlipidemia, CK D, presents to the emergency department with chest pain. According to the patient for the past one month he has been experiencing intermittent chest pain with shortness of breath. Patient has been following up with his cardiologist including this past Friday. Patient states last night the chest pain worsens, he took 3 nitroglycerin tablets last night without relief continued to hurt overnight into this morning took one additional nitroglycerin tablet this morning again without relief so the patient came to the emergency department. States some nausea but denies vomiting. Denies diaphoresis. States shortness of breath somewhat increased over baseline. Patient states a history of 2 MIs in the past with 2 stents last which was placed in 2007.  Past Medical History:  Diagnosis Date  . Adrenal gland anomaly   . Arthritis   . CAD (coronary artery disease)    s/p MI x 3 now with 2 stents  . CHF (congestive heart failure) ([place])   . Colon polyps   . Diabetes mellitus with neuropathy (Stephen [name])   . DM2 (diabetes mellitus, type 2) (HCC)    insulin requiring  . Dyslipidemia   . GERD (gastroesophageal reflux disease)   . Heart disease   . High cholesterol   . HTN (hypertension)   . Kidney disease   . Kidney stones   . Metabolic syndrome   . OSA (obstructive sleep apnea)   . 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 Recurrent UTI   . Renal artery stenosis (HCC)    L sided, stent 07/2013  . Rotator cuff disorder    right  . Skin cancer    head  . Thyroid disorder     Patient Active Problem List   Diagnosis Date Noted  . Blood in urine 12/08/2016  . Lymphedema 10/23/2016  . Low back pain with right-sided sciatica 04/03/2014  . Renal artery stenosis, native (Stephen [name]) 08/07/2013  . Muscle spasm 04/24/2013  . Chronic edema 11/13/2012  . Leg edema, right 09/29/2012  . SK (seborrheic keratosis) 09/29/2012  . Chronic diastolic CHF (congestive heart failure) (Stephen [name]) 12/15/2011  . Sebaceous cyst 11/28/2011  . Morbid obesity (Stephen [name]) 07/27/2011  . HLD (hyperlipidemia) 04/26/2011  . Adrenal adenoma 03/02/2011  . Back pain 12/06/2010  . Kidney stones 12/06/2010  . Essential hypertension, benign 10/28/2010  . PULMONARY FIBROSIS 09/15/2010  . CAD, NATIVE VESSEL 09/01/2010  . Type 2 diabetes mellitus with renal complication (Stephen [name]) 35/57/3220  . METABOLIC SYNDROME X 25/42/7062  . SLEEP APNEA, OBSTRUCTIVE 08/31/2010  . Chronic kidney disease, stage III (moderate) 08/31/2010    Past Surgical History:  Procedure Laterality Date  . adrenal adenoma removal  1990-   right  . CARPAL TUNNEL RELEASE     bilateral  . CORONARY ANGIOPLASTY WITH STENT PLACEMENT  2007   x 2  . KNEE SURGERY     right  . LUNG BIOPSY    . RENAL ARTERY STENT  2015   L  . SKIN CANCER EXCISION     back of head    Prior to Admission medications   Medication Sig Start Date End Date Taking? Authorizing  Provider  ACCU-CHEK SOFTCLIX LANCETS lancets Use as instructed to check blood sugar three times daily and as needed.  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 03/09/16   [name], MD  aspirin 81 MG tablet Take 81 mg by mouth 2 (two) times daily.      [provider]  atorvastatin (LIPITOR) 80 MG tablet TAKE 1 TABLET AT BEDTIME 02/03/16   [name], MD  Blood Glucose Monitoring Suppl (ACCU-CHEK AVIVA PLUS) w/Device KIT Use to check blood sugar three times daily and as needed.  Diagnosis: E11.22  Insulin-dependent 03/02/16   [name], MD  carvedilol (COREG) 25 MG tablet TAKE 1 TABLET TWICE DAILY WITH A  MEAL 02/03/16   [name], MD  cloNIDine (CATAPRES) 0.3 MG tablet TAKE 1 TABLET THREE TIMES DAILY 10/27/15   Minna Merritts, MD  clopidogrel (PLAVIX) 75 MG tablet Take 1 tablet (75 mg total) by mouth daily. 08/07/16   Minna Merritts, MD  fluticasone Asencion Islam) 50 MCG/ACT nasal spray  08/14/16   [provider]  gabapentin (NEURONTIN) 800 MG tablet TAKE 1 TABLET IN THE MORNING  AND TAKE 1 AND 1/2 TABLETS AT BEDTIME 10/25/16   [name], MD  glucose blood (ACCU-CHEK AVIVA PLUS) test strip Use as instructed to check blood sugar 3 times daily or as needed.  Diagnosis:  E11.22  Insulin-dependent. 03/02/16   [name], MD  hydrALAZINE (APRESOLINE) 50 MG tablet Take 1 tablet (50 mg total) by mouth 3 (three) times daily. 06/06/16   Minna Merritts, MD  insulin NPH-regular Human (NOVOLIN 70/30) (70-30) 100 UNIT/ML injection INJECT  70 UNITS SUBCUTANEOUSLY EVERY MORNING  AND  35-45 UNITS IN THE EVENING 09/30/14   [name], MD  Insulin Syringe-Needle U-100 (INSULIN SYRINGE 1CC/30GX5/16") 30G X 5/16" 1 ML MISC Use as directed to take insulin Dx 250.62 07/14/13   Ria Bush, MD  isosorbide mononitrate (IMDUR) 60 MG 24 hr tablet Take 1 tablet (60 mg total) by mouth 2 (two) times daily. 10/24/16   Minna Merritts, MD  nitroGLYCERIN (NITROSTAT) 0.4 MG SL tablet Place 1 tablet (0.4 mg total) under the tongue every 5 (five) minutes as needed. May repeat x3 12/16/13   [name], MD  NON FORMULARY Oxygen 2 liters 24/7    [provider]  omeprazole (PRILOSEC) 20 MG capsule Take 20 mg by mouth daily.      [provider]  oxyCODONE (ROXICODONE) 15 MG immediate release tablet Take 1 tablet (15 mg total) by mouth every evening. 12/07/16   [name], MD  torsemide (DEMADEX) 20 MG tablet Take 2 tablets (40 mg total) by mouth daily. 01/05/17   Minna Merritts, MD  triamterene-hydrochlorothiazide (MAXZIDE-25) 37.5-25 MG tablet Take 1 tablet by mouth daily.  11/04/15   Minna Merritts, MD    Allergies  Allergen Reactions  . Calcium Channel Blockers     Would avoid if possible due to h/o peripheral edema    Family History  Problem Relation Age of Onset  . Heart attack Father   . Heart attack Mother   . Heart attack Brother   . COPD Sister   . Arthritis Maternal Grandmother   . Breast cancer Maternal Aunt   . Diabetes Maternal Aunt   . Hypertension Other   . Hypertension Maternal Aunt     Social History Social History  Substance Use Topics  . Smoking status: Never Smoker  . Smokeless tobacco: Never Used  .  Alcohol use No    Review of Systems Constitutional: Negative for fever. Cardiovascular: Positive for central chest pain Respiratory: Positive for increased shortness of breath Gastrointestinal: Negative for abdominal pain. Positive for nausea. Negative for vomiting Genitourinary: Negative for dysuria. Neurological: Negative for headache All other ROS negative  ____________________________________________   PHYSICAL EXAM:  VITAL SIGNS: ED Triage Vitals  Enc Vitals Group     BP 01/11/17 0910 (!) 186/76     Pulse Rate 01/11/17 0910 62     Resp 01/11/17 0910 18     Temp 01/11/17 0910 97.9 F (36.6 C)     Temp Source 01/11/17 0910 Oral     SpO2 01/11/17 0910 94 %     Weight 01/11/17 0911 262 lb (118.8 kg)     Height 01/11/17 0911 '5\' 5"'$  (1.651 m)     Head Circumference --      Peak Flow --      Pain Score 01/11/17 0910 7     Pain Loc --      Pain Edu? --      Excl. in [place]? --     Constitutional: Alert and oriented. Appears to be in some discomfort but no distress. Eyes: Normal exam ENT   Head: Normocephalic and atraumatic.   Mouth/Throat: Mucous membranes are moist. Cardiovascular: Normal rate, regular rhythm. No murmur Respiratory: Normal respiratory effort without tachypnea nor retractions. Breath sounds are clear  Gastrointestinal: Soft and nontender. No distention.   Musculoskeletal: Nontender  with normal range of motion in all extremities. No lower extremity tenderness or edema. Neurologic:  Normal speech and language. No gross focal neurologic deficits Skin:  Skin is warm, dry and intact.  Psychiatric: Mood and affect are normal.   ____________________________________________    EKG  EKG reviewed and interpreted by myself shows normal sinus rhythm at 63 bpm, slightly widened QRS, left axis deviation, normal intervals patient does have nonspecific ST changes, slight ST elevation in aVR, does not meet STEMI criteria.  EKG #2. In interpreted by myself shows normal sinus rhythm at 60 bpm, slightly widened QRS, left axis deviation, [place] normal intervals, again slight ST elevation in aVR with mild depression and one but does not meet STEMI criteria.  ____________________________________________    RADIOLOGY  X-ray shows moderate vascular congestion without pulmonary edema.  ____________________________________________   INITIAL IMPRESSION / ASSESSMENT AND PLAN / ED COURSE  Pertinent labs & imaging results that were available during my care of the patient were reviewed by me and considered in my medical decision making (see chart for details).  Patient with a past coronary history presents to the emergency department with chest pain over the past one month intermittent although worse since last night describes it currently as a 7/10 pressure sensation to the center of his chest. Patient states some nausea with somewhat increased shortness of breath denies any diaphoresis. Patient's labs have resulted with a significant troponin elevation of 2.38. Patient's EKG does show slight ST elevation in lead aVR with slight ST depression in lead 1 other leads are largely at baseline. We will discuss with cardiology the EKG findings. Given the patient's NSTEMI we will order heparin and admitted to the hospital. We will also dose aspirin in the emergency department  CRITICAL CARE Performed  by: Harvest Dark   Total critical care time: 30 minutes  Critical care time was exclusive of separately billable procedures and treating other patients.  Critical care was necessary to treat or prevent imminent or life-threatening deterioration.  Critical care was time spent personally by me on the following activities: development of treatment plan with patient and/or surrogate as well as nursing, discussions with consultants, evaluation of patient's response to treatment, examination of patient, obtaining history from patient or surrogate, ordering and performing treatments and interventions, ordering and review of laboratory studies, ordering and review of radiographic studies, pulse oximetry and re-evaluation of patient's condition.  I discussed the patient with Dr.Arida, who recommends pushing 4000 units of heparin, starting a heparin drip and he will be down to the ER to see the patient.   ____________________________________________   FINAL CLINICAL IMPRESSION(S) / ED DIAGNOSES  NSTEMI    Harvest Dark, MD 01/11/17 1553

## 2017-01-11 NOTE — ED Triage Notes (Addendum)
Pt reports intermittent central non radiating chest pain for two months. Pt reports SOB and nausea. Pt reports took 3 NTG yesterday and one today without relief. Pt in no apparent distress in triage.

## 2017-01-11 NOTE — ED Notes (Signed)
Patient to Room 25, Mickel Baas RN aware of Troponin level.

## 2017-01-11 NOTE — ED Notes (Signed)
Patient reports intermittent CP x1 month. Worse for past two day. Reports taking 3 SL nitro last night and 1 this morning with no relief.

## 2017-01-11 NOTE — OR Nursing (Signed)
Dr Fletcher Anon notified that pt continues to be hypertensive, with continued chest pain 5/10. Diuresed 500 ml urine post lasix

## 2017-01-11 NOTE — Progress Notes (Signed)
ANTICOAGULATION CONSULT NOTE - Initial Consult  Pharmacy Consult for Heparin Drip Indication: chest pain/ACS  Allergies  Allergen Reactions  . Calcium Channel Blockers     Would avoid if possible due to h/o peripheral edema    Patient Measurements: Height: 5\' 5"  (165.1 cm) Weight: 262 lb (118.8 kg) IBW/kg (Calculated) : 61.5 Heparin Dosing Weight: 89.5 kg  Vital Signs: Temp: 97.9 F (36.6 C) (07/12 0910) Temp Source: Oral (07/12 0910) BP: 186/76 (07/12 0910) Pulse Rate: 62 (07/12 0910)  Labs:  Recent Labs  01/11/17 0912  HGB 13.6  HCT 40.8  PLT 185  CREATININE 1.88*  TROPONINI 2.38*    Estimated Creatinine Clearance: 44.9 mL/min (A) (by C-G formula based on SCr of 1.88 mg/dL (H)).   Medical History: Past Medical History:  Diagnosis Date  . Adrenal gland anomaly   . Arthritis   . CAD (coronary artery disease)    s/p MI x 3 now with 2 stents  . CHF (congestive heart failure) ([employer])   . Colon polyps   . Diabetes mellitus with neuropathy ([place])   . DM2 (diabetes mellitus, type 2) (HCC)    insulin requiring  . Dyslipidemia   . GERD (gastroesophageal reflux disease)   . Heart disease   . High cholesterol   . HTN (hypertension)   . Kidney disease   . Kidney stones   . Metabolic syndrome   . OSA (obstructive sleep apnea)   . 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 Recurrent UTI   . Renal artery stenosis (HCC)    L sided, stent 07/2013  . Rotator cuff disorder    right  . Skin cancer    head  . Thyroid disorder     Medications:  Infusions:  . heparin      Assessment:  Goal of Therapy:  Heparin level 0.3-0.7 units/ml Monitor platelets by anticoagulation protocol: Yes   Plan:  Give 4000 units bolus x 1 Start heparin infusion at 1000 units/hr Check anti-Xa level in 6 hours and daily while on heparin Continue to monitor H&H and platelets  Kelise Kuch K,  RPh 01/11/2017,10:49 AM

## 2017-01-11 NOTE — ED Notes (Signed)
To Radiology via [place]

## 2017-01-11 NOTE — OR Nursing (Signed)
concom cath applied

## 2017-01-11 NOTE — Consult Note (Signed)
Name: Stephen [name] MRN: [id number] DOB: 1945-08-20    ADMISSION DATE:  01/11/2017 CONSULTATION DATE: 01/11/2017  REFERRING MD: Dr. Bridgett Larsson   CHIEF COMPLAINT: Chest Pain   BRIEF PATIENT DESCRIPTION:  71 yo male admitted 07/12 with unstable angina secondary to NSTEMI cardiac catheterization 07/12 revealed significant 2 vessel coronary artery disease with occluded stent in the mid left circumflex with collaterals noted from the LAD and right coronary artery. Patent RCA stent with 70% stenosis distal to the stent in the midsegment and 50% stenosis in the distal segment. Moderate LAD disease.  Per Cardiology recommending optimizing medical therapy due to presence of collaterals.    PAST MEDICAL HISTORY :   has a past medical history of Adrenal gland anomaly; Arthritis; CAD (coronary artery disease); CHF (congestive heart failure) ([place]); Colon polyps; Diabetes mellitus with neuropathy ([place]); DM2 (diabetes mellitus, type 2) ([place]); Dyslipidemia; GERD (gastroesophageal reflux disease); Heart disease; High cholesterol; HTN (hypertension); Kidney disease; Kidney stones; Metabolic syndrome; OSA (obstructive sleep apnea); Pulmonary fibrosis ([place]); Recurrent UTI; Renal artery stenosis ([place]); Rotator cuff disorder; Skin cancer; and Thyroid disorder.  has a past surgical history that includes adrenal adenoma removal (1990-); Coronary angioplasty with stent (2007); Knee surgery; Lung biopsy; Carpal tunnel release; Skin cancer excision; and Renal artery stent (2015). Prior to Admission medications   Medication Sig Start Date End Date Taking? Authorizing Provider  ACCU-CHEK SOFTCLIX LANCETS lancets Use as instructed to check blood sugar three times daily and as needed.  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 03/09/16   [name], MD  aspirin 81 MG tablet Take 81 mg by mouth 2 (two) times daily.      [provider]  atorvastatin (LIPITOR) 80 MG tablet TAKE 1 TABLET AT BEDTIME 02/03/16   [name], MD  Blood Glucose Monitoring Suppl (ACCU-CHEK AVIVA PLUS) w/Device KIT Use to check blood sugar three times daily and as needed.  Diagnosis: E11.22  Insulin-dependent 03/02/16   [name], MD  carvedilol (COREG) 25 MG tablet TAKE 1 TABLET TWICE DAILY WITH A MEAL 02/03/16   [name], MD  cloNIDine (CATAPRES) 0.3 MG tablet TAKE 1 TABLET THREE TIMES DAILY 10/27/15   Minna Merritts, MD  clopidogrel (PLAVIX) 75 MG tablet Take 1 tablet (75 mg total) by mouth daily. 08/07/16   Minna Merritts, MD  fluticasone Asencion Islam) 50 MCG/ACT nasal spray  08/14/16   [provider]  gabapentin (NEURONTIN) 800 MG tablet TAKE 1 TABLET IN THE MORNING  AND TAKE 1 AND 1/2 TABLETS AT BEDTIME 10/25/16   [name], MD  glucose blood (ACCU-CHEK AVIVA PLUS) test strip Use as instructed to check blood sugar 3 times daily or as needed.  Diagnosis:  E11.22  Insulin-dependent. 03/02/16   [name], MD  hydrALAZINE (APRESOLINE) 50 MG tablet Take 1 tablet (50 mg total) by mouth 3 (three) times daily. 06/06/16   Minna Merritts, MD  insulin NPH-regular Human (NOVOLIN 70/30) (70-30) 100 UNIT/ML injection INJECT  70 UNITS SUBCUTANEOUSLY EVERY MORNING  AND  35-45 UNITS IN THE EVENING 09/30/14   [name], MD  Insulin Syringe-Needle U-100 (INSULIN SYRINGE 1CC/30GX5/16") 30G X 5/16" 1 ML MISC Use as directed to take insulin Dx 250.62 07/14/13   Ria Bush, MD  isosorbide mononitrate (IMDUR) 60 MG 24 hr tablet Take 1 tablet (60 mg total) by mouth 2 (two) times daily. 10/24/16   Minna Merritts, MD  nitroGLYCERIN (NITROSTAT) 0.4 MG SL tablet Place 1 tablet (  0.4 mg total) under the tongue every 5 (five) minutes as needed. May repeat x3 12/16/13   [name], MD  NON FORMULARY Oxygen 2 liters 24/7    [provider]  omeprazole (PRILOSEC) 20 MG capsule Take 20 mg by mouth daily.      [provider]  oxyCODONE (ROXICODONE) 15 MG immediate release tablet Take 1 tablet (15 mg  total) by mouth every evening. 12/07/16   [name], MD  torsemide (DEMADEX) 20 MG tablet Take 2 tablets (40 mg total) by mouth daily. 01/05/17   Minna Merritts, MD  triamterene-hydrochlorothiazide (MAXZIDE-25) 37.5-25 MG tablet Take 1 tablet by mouth daily. 11/04/15   Minna Merritts, MD   Allergies  Allergen Reactions  . Calcium Channel Blockers     Would avoid if possible due to h/o peripheral edema    FAMILY HISTORY:  family history includes Arthritis in his maternal grandmother; Breast cancer in his maternal aunt; COPD in his sister; Diabetes in his maternal aunt; Heart attack in his brother, father, and mother; Hypertension in his maternal aunt and other. SOCIAL HISTORY:  reports that he has never smoked. He has never used smokeless tobacco. He reports that he does not drink alcohol or use drugs.  REVIEW OF SYSTEMS:   Constitutional: Negative for fever, chills, weight loss, malaise/fatigue and diaphoresis.  HENT: Negative for hearing loss, ear pain, nosebleeds, congestion, sore throat, neck pain, tinnitus and ear discharge.   Eyes: Negative for blurred vision, double vision, photophobia, pain, discharge and redness.  Respiratory: Negative for cough, hemoptysis, sputum production, shortness of breath, wheezing and stridor.   Cardiovascular: chest pain, palpitations, orthopnea, claudication, leg swelling and PND.  Gastrointestinal: Negative for heartburn, nausea, vomiting, abdominal pain, diarrhea, constipation, blood in stool and melena.  Genitourinary: Negative for dysuria, urgency, frequency, hematuria and flank pain.  Musculoskeletal: Negative for myalgias, back pain, joint pain and falls.  Skin: Negative for itching and rash.  Neurological: Negative for dizziness, tingling, tremors, sensory change, speech change, focal weakness, seizures, loss of consciousness, weakness and headaches.  Endo/Heme/Allergies: Negative for environmental allergies and polydipsia. Does not  bruise/bleed easily.  SUBJECTIVE:  States his chest pain has improved he currently is having 1/10 mid sternal chest pain  VITAL SIGNS: Temp:  [97.9 F (36.6 C)-98.3 F (36.8 C)] 98.1 F (36.7 C) (07/12 1600) Pulse Rate:  [59-76] 76 (07/12 1600) Resp:  [12-24] 22 (07/12 1547) BP: (166-202)/(76-143) 185/101 (07/12 1600) SpO2:  [88 %-95 %] 90 % (07/12 1600) Weight:  [118.8 kg (262 lb)] 118.8 kg (262 lb) (07/12 0911)  PHYSICAL EXAMINATION: General: well developed, well nourished, obese Caucasian male  Neuro: alert and oriented, follows commands  HEENT: supple, no JVD Cardiovascular: nsr, s1s2, no M/R/G Lungs: clear throughout, even, non labored; no wheezes, rhonchi, crackles, or rales   Abdomen: +BS x4, obese, soft, non tender, non distended  Musculoskeletal: normal tone, mild bilateral lower extremity edema  Skin: intact no rashes or lesions    Recent Labs Lab 01/11/17 0912  NA 140  K 3.3*  CL 104  CO2 28  BUN 27*  CREATININE 1.88*  GLUCOSE 165*    Recent Labs Lab 01/11/17 0912  HGB 13.6  HCT 40.8  WBC 7.9  PLT 185   Dg Chest 2 View  Result Date: 01/11/2017 CLINICAL DATA:  Chest pain and shortness of breath for 1 month. EXAM: CHEST  2 VIEW COMPARISON:  04/24/2013 FINDINGS: The heart is upper limits of normal in size and  stable. Mild tortuosity and calcification of the thoracic aorta. Stable eventration of both hemidiaphragms, right greater than left. Moderate vascular congestion without overt pulmonary edema. Streaky areas of subsegmental atelectasis and scarring changes. No definite infiltrates or effusions. The bony thorax is intact. Suspect changes of DISH. Advanced degenerative changes involving the right shoulder. IMPRESSION: Moderate vascular congestion without overt pulmonary edema. No infiltrates or effusions. Electronically Signed   By: Marijo Sanes M.D.   On: 01/11/2017 09:42    ASSESSMENT / PLAN: NSTEMI (post cardiac cath 07/12) Unstable Angina    Hypertension  Hx: CKD Stage III  P: Supplemental O2 to maintain O2 sats >92% Continue nitroglycerin gtt for HTN and/or unstable angina  Prn iv morphine for unstable angina  Will restart outpatient aspirin, plavix, clonidine, hydralazine, and imdur Trend troponin's  Lipid panel and Hemoglobin A1C pending  Continuous telemetry monitoring  Trend BMP Replace electrolytes as indicated Monitor UOP   Marda Stalker, [place] Pager [phone] (please enter 7 digits) PCCM Consult Pager [phone] (please enter 7 digits)

## 2017-01-11 NOTE — ED Notes (Signed)
Date and time results received: 01/11/17 9:48 (use smartphrase ".now" to insert current time)  Test: Troponin Critical Value: 2.38  Name of Provider Notified: Velta Addison Charge Nurse  Orders Received? Or Actions Taken?: Awaiting bed placement in ED.

## 2017-01-11 NOTE — Progress Notes (Signed)
Talked to Dr Fletcher Anon regarding patients coarse lung sounds, order to admin lasix IV 40BID. Computer order to be adjusted to reflect new route.

## 2017-01-11 NOTE — ED Notes (Signed)
Pt. Returns from Radiology.

## 2017-01-12 ENCOUNTER — Encounter: Admission: RE | Admit: 2017-01-12 | Payer: Medicare HMO | Source: Ambulatory Visit

## 2017-01-12 ENCOUNTER — Encounter: Payer: Self-pay | Admitting: Cardiovascular Disease

## 2017-01-12 ENCOUNTER — Ambulatory Visit: Payer: Medicare HMO | Admitting: Urology

## 2017-01-12 LAB — GLUCOSE, CAPILLARY
Glucose-Capillary: 306 mg/dL — ABNORMAL HIGH (ref 65–99)
Glucose-Capillary: 359 mg/dL — ABNORMAL HIGH (ref 65–99)
Glucose-Capillary: 362 mg/dL — ABNORMAL HIGH (ref 65–99)
Glucose-Capillary: 305 mg/dL — ABNORMAL HIGH (ref 65–99)

## 2017-01-12 LAB — LIPID PANEL
Cholesterol: 141 mg/dL (ref 0–200)
HDL: 22 mg/dL — ABNORMAL LOW (ref >40)
LDL Cholesterol: 87 mg/dL (ref 0–99)
Total CHOL/HDL Ratio: 6.4 RATIO
Triglycerides: 160 mg/dL — ABNORMAL HIGH (ref <150)
VLDL: 32 mg/dL (ref 0–40)

## 2017-01-12 LAB — BASIC METABOLIC PANEL
Anion gap: 8 (ref 5–15)
BUN: 24 mg/dL — ABNORMAL HIGH (ref 6–20)
CO2: 28 mmol/L (ref 22–32)
Calcium: 8.7 mg/dL — ABNORMAL LOW (ref 8.9–10.3)
Chloride: 99 mmol/L — ABNORMAL LOW (ref 101–111)
Creatinine, Ser: 1.69 mg/dL — ABNORMAL HIGH (ref 0.61–1.24)
GFR calc Af Amer: 46 mL/min — ABNORMAL LOW (ref >60)
GFR calc non Af Amer: 40 mL/min — ABNORMAL LOW (ref >60)
Glucose, Bld: 284 mg/dL — ABNORMAL HIGH (ref 65–99)
Potassium: 3.8 mmol/L (ref 3.5–5.1)
Sodium: 135 mmol/L (ref 135–145)

## 2017-01-12 LAB — HEMOGLOBIN A1C
Hgb A1c MFr Bld: 7 % — ABNORMAL HIGH (ref 4.8–5.6)
Mean Plasma Glucose: 154 mg/dL

## 2017-01-12 MED ORDER — INSULIN DETEMIR 100 UNIT/ML ~~LOC~~ SOLN
25.0000 [IU] | Freq: Two times a day (BID) | SUBCUTANEOUS | Status: DC
  Administered 2017-01-12 – 2017-01-13 (×3): 25 [IU] via SUBCUTANEOUS
  Filled 2017-01-12 (×6): qty 0.25

## 2017-01-12 MED ORDER — ISOSORBIDE MONONITRATE ER 60 MG PO TB24
90.0000 mg | ORAL_TABLET | Freq: Two times a day (BID) | ORAL | Status: DC
  Administered 2017-01-12 – 2017-01-13 (×3): 90 mg via ORAL
  Filled 2017-01-12: qty 1
  Filled 2017-01-12: qty 3
  Filled 2017-01-12: qty 1
  Filled 2017-01-12: qty 3

## 2017-01-12 MED ORDER — INSULIN ASPART 100 UNIT/ML ~~LOC~~ SOLN
5.0000 [IU] | Freq: Three times a day (TID) | SUBCUTANEOUS | Status: DC
  Administered 2017-01-12 – 2017-01-13 (×3): 5 [IU] via SUBCUTANEOUS
  Filled 2017-01-12 (×3): qty 1

## 2017-01-12 MED ORDER — AMLODIPINE BESYLATE 5 MG PO TABS
5.0000 mg | ORAL_TABLET | Freq: Every day | ORAL | Status: DC
  Administered 2017-01-12 – 2017-01-13 (×2): 5 mg via ORAL
  Filled 2017-01-12 (×2): qty 1

## 2017-01-12 MED ORDER — IPRATROPIUM-ALBUTEROL 0.5-2.5 (3) MG/3ML IN SOLN: 3.0000 mL | RESPIRATORY_TRACT | Status: DC | PRN

## 2017-01-12 NOTE — Progress Notes (Signed)
Pt b/p has been uncontrolled through out the night, Nitro drip is now running at 56mcg with b/p in 150's/80's. Per MD, b/p is ok to stay in the 150's for now. Gave morphine for chest pain through out the night, it was decrease to 2. Pt in no distress at this time.

## 2017-01-12 NOTE — Progress Notes (Signed)
Patient Name: Stephen [name] Date of Encounter: 01/12/2017  Primary Cardiologist: [facility] Problem List     Principal Problem:   NSTEMI (non-ST elevated myocardial infarction) [facility]) Active Problems:   CAD, NATIVE VESSEL   Type 2 diabetes mellitus with renal complication (HCC)   Chronic kidney disease, stage III (moderate)   PULMONARY FIBROSIS     Subjective   Admitted with a NSTEMI. Troponin peaked at 10. LHC on 7/12 showed CTO of the LCx with collaterals from LAD and RCA. Patent RCA stent with 70% stenosis distal to the stent in the midsegment and 50% stenosis in the distal segment. Has remained chest pain free. BP remains elevated. Has been started on a nitro gtt without much improvement in BP. Post cath labs pending.   Inpatient Medications    Scheduled Meds: . aspirin EC  81 mg Oral Daily  . atorvastatin  80 mg Oral QHS  . carvedilol  25 mg Oral BID WC  . cloNIDine  0.3 mg Oral TID  . clopidogrel  75 mg Oral Daily  . enoxaparin (LOVENOX) injection  40 mg Subcutaneous Q24H  . furosemide  40 mg Intravenous BID  . hydrALAZINE  50 mg Oral TID  . insulin aspart  0-5 Units Subcutaneous QHS  . insulin aspart  0-9 Units Subcutaneous TID WC  . ipratropium-albuterol  3 mL Nebulization Q6H  . isosorbide mononitrate  60 mg Oral BID  . oxyCODONE  15 mg Oral QPM  . pantoprazole  40 mg Oral Daily  . sodium chloride flush  3 mL Intravenous Q12H  . triamterene-hydrochlorothiazide  1 tablet Oral Daily   Continuous Infusions: . sodium chloride    . nitroGLYCERIN 80 mcg/min (01/12/17 0610)   PRN Meds: sodium chloride, acetaminophen, ALPRAZolam, hydrALAZINE, morphine, nitroGLYCERIN, ondansetron (ZOFRAN) IV, sodium chloride flush, zolpidem   Vital Signs    Vitals:   01/12/17 0600 01/12/17 0630 01/12/17 0700 01/12/17 0800  BP: (!) 168/73 (!) 156/80 (!) 163/74 (!) 152/66  Pulse: 67 71 74 72  Resp: 18 15 (!) 22 15  Temp: 98.3 F (36.8 C)     TempSrc: Oral     SpO2:  94% 94% 97% 93%  Weight:      Height:        Intake/Output Summary (Last 24 hours) at 01/12/17 0915 Last data filed at 01/12/17 0909  Gross per 24 hour  Intake           989.62 ml  Output             3375 ml  Net         -2385.38 ml   Filed Weights   01/11/17 0911  Weight: 262 lb (118.8 kg)    Physical Exam    GEN: Well nourished, well developed, in no acute distress.  HEENT: Grossly normal.  Neck: Supple, no JVD, carotid bruits, or masses. Cardiac: RRR, no murmurs, rubs, or gallops. No clubbing, cyanosis, 1+ non-pitting edema bilaterally. Radials/DP/PT 2+ and equal bilaterally. Right radial cath site without bleeding, bruising, swelling, erythema, or TTP. Radial pulse 2+. Respiratory:  Respirations regular and unlabored, clear to auscultation bilaterally. GI: Soft, nontender, nondistended, BS + x 4. MS: No deformity or atrophy. Skin: Warm and dry, no rash. Neuro:  Strength and sensation are intact. Psych: AAOx3.  Normal affect.  Labs    CBC  Recent Labs  01/11/17 0912  WBC 7.9  HGB 13.6  HCT 40.8  MCV 85.4  PLT 185  Basic Metabolic Panel  Recent Labs  01/11/17 0912 01/11/17 1807  NA 140  --   K 3.3*  --   CL 104  --   CO2 28  --   GLUCOSE 165*  --   BUN 27*  --   CREATININE 1.88*  --   CALCIUM 8.6*  --   MG  --  1.8   Liver Function Tests No results for input(s): AST, ALT, ALKPHOS, BILITOT, PROT, ALBUMIN in the last 72 hours. No results for input(s): LIPASE, AMYLASE in the last 72 hours. Cardiac Enzymes  Recent Labs  01/11/17 0912 01/11/17 1807  TROPONINI 2.38* 10.00*   BNP Invalid input(s): POCBNP D-Dimer No results for input(s): DDIMER in the last 72 hours. Hemoglobin A1C  Recent Labs  01/11/17 1807  HGBA1C 7.0*   Fasting Lipid Panel  Recent Labs  01/12/17 0440  CHOL 141  HDL 22*  LDLCALC 87  TRIG 160*  CHOLHDL 6.4   Thyroid Function Tests No results for input(s): TSH, T4TOTAL, T3FREE, THYROIDAB in the last 72  hours.  Invalid input(s): FREET3  Telemetry    NSR - Personally Reviewed  ECG    n/a - Personally Reviewed  Radiology    Dg Chest 2 View  Result Date: 01/11/2017 IMPRESSION: Moderate vascular congestion without overt pulmonary edema. No infiltrates or effusions. Electronically Signed   By: Marijo Sanes M.D.   On: 01/11/2017 09:42    Cardiac Studies   LHC 01/11/2017: Conclusion     Mid Cx lesion, 100 %stenosed.  Prox LAD lesion, 30 %stenosed.  Mid LAD lesion, 50 %stenosed.  Dist LAD lesion, 30 %stenosed.  LM lesion, 30 %stenosed.  Prox RCA lesion, 20 %stenosed.  Dist RCA lesion, 50 %stenosed.  Mid RCA-2 lesion, 70 %stenosed.  Mid RCA-1 lesion, 10 %stenosed.  Post Atrio lesion, 40 %stenosed.   1. Significant 2 vessel coronary artery disease with occluded stent in the mid left circumflex with collaterals noted from the LAD and right coronary artery. Patent RCA stent with 70% stenosis distal to the stent in the midsegment and 50% stenosis in the distal segment. Moderate LAD disease.  2. Moderately elevated left ventricular end-diastolic pressure with LVEDP of 25 mmHg.  Recommendations: The occluded mid left circumflex is likely the culprit for myocardial infarction but based on his symptoms of weeks duration and noted collaterals, I suspect that this is likely subacute. Due to presence of collaterals, I recommend optimizing medical therapy. The patient is volume overloaded and needs diuresis. I ordered an echocardiogram. Intensify antianginal therapy. If the patient continues to have significant angina in spite of medical therapy, FFR based PCI of the right coronary artery can be considered.    TTE 01/11/2017: Study Conclusions  - Procedure narrative: The only view obtainable was parasternal.   Transthoracic echocardiography. The study was technically   difficult, as a result of restricted patient mobility. - Left ventricle: The cavity size was normal.  There was moderate   concentric hypertrophy. Systolic function was normal. The   estimated ejection fraction was in the range of 50% to 55%.   Images were inadequate for LV wall motion assessment. The study   is not technically sufficient to allow evaluation of LV diastolic   function. - Mitral valve: Calcified annulus.  Patient Profile     71 y.o. male with history of CAD s/p prior MI and PCI/stenting to the mid LCx and mid RCA in 04/2006, interstitial fibrosis s/p wedge resection, on chronic oxygen, CKD stage III,  HTN, HLD, DM2, and remote tobacco abuse who presented to [facility] with chest pain and was found to have a NSTEMI.   Assessment & Plan    1. NSTEMI/CAD: -Chest pain free -Troponin peaked at 10 -LHC on 7/12 showed CTO of the LCx with collaterals from LAD and RCA. Patent RCA stent with 70% stenosis distal to the stent in the midsegment and 50% stenosis in the distal segment -Consider repeat LHC with FFR of the RCA if he has recurrence of chest pain -For now, optimize medical therapy -Stop nitro gtt as this does not seem to be helping greatly with his HTN -Increase Imdur to 90 mg daily -Consider adding Ranexa if has return of chest pain -Continue ASA 81 mg and Plavix 75 mg daily -Continue Coreg, Lipitor -TTE with mildly reduced LVSF  2. CKD stage III: -Post cath labs pending -Monitor  3. Accelerated HTN: -BP remains elevated on nitro gtt, Lasix, Coreg, hydralazine, clonidine, and Maxzide -Add amlodipine 5 mg daily (has previously had LE swelling on amlodipine 10 mg daily, will not plan to titrate this medication any further) -If needed, could titrate hydralazine to 100 mg tid -Imdur increased as above -Consider outpatient secondary/refractory HTN workup -SL NTG and morphine as above  4. DM2: -Per IM  5. Pulmonary fibrosis: -Stable  6. Chronic diastolic CHF: -TTE as above -Await LHC   7. HLD: -Lipitor -Check lipid panel  8. Dispo: -Ok to transfer out of  ICU today, continue with telemetry -Will need to ambulate prior to discharge to evaluate for return of chest pain  Signed, Marcille Blanco [place] Pager: [phone] 01/12/2017, 9:15 AM

## 2017-01-12 NOTE — Progress Notes (Signed)
Inpatient Diabetes Program Recommendations  AACE/ADA: New Consensus Statement on Inpatient Glycemic Control (2015)  Target Ranges:  Prepandial:   less than 140 mg/dL      Peak postprandial:   less than 180 mg/dL (1-2 hours)      Critically ill patients:  140 - 180 mg/dL   Lab Results  Component Value Date   GLUCAP 258 (H) 01/11/2017   HGBA1C 7.0 (H) 01/11/2017    Review of Glycemic Control  Results for Stephen [name], Stephen [name] (MRN [id number]) as of 01/12/2017 08:27  Ref. Range 01/11/2017 16:10 01/11/2017 22:16  Glucose-Capillary Latest Ref Range: 65 - 99 mg/dL 169 (H) 258 (H)    Diabetes history: Type 2 Outpatient Diabetes medications: Novolin 70/30 70 units qam & 50 units qpm Current orders for Inpatient glycemic control: Novolog 0-9 units tid, Novolog 0-5 units qhs  Inpatient Diabetes Program Recommendations:   Consider Levemir 25 units bid, add Novolog 5 units tid with meals (hold if he eats less than 50%) and continue Novolog correction scales as ordered.   Gentry Fitz, RN, BA, MHA, CDE Diabetes Coordinator Inpatient Diabetes Program  [phone] (Team Pager) [phone] ([place]) 01/12/2017 8:32 AM

## 2017-01-12 NOTE — Progress Notes (Signed)
Stephen [name] at [place] NAME: Stephen [name]    MR#:  [id number]  DATE OF BIRTH:  1946/03/14  SUBJECTIVE:  CHIEF COMPLAINT:   Chief Complaint  Patient presents with  . Chest Pain  Came with chest pain, had previous stents- Taken to catheterization and blockages found but he had collaterals so no stent was placed. Blood pressure was out of control so started on nitroglycerin drip and finally today drip stopped and added some new medicine for better blood pressure control. Patient denies any chest pain.  REVIEW OF SYSTEMS:  CONSTITUTIONAL: No fever, fatigue or weakness.  EYES: No blurred or double vision.  EARS, NOSE, AND THROAT: No tinnitus or ear pain.  RESPIRATORY: No cough, shortness of breath, wheezing or hemoptysis.  CARDIOVASCULAR: No chest pain, orthopnea, edema.  GASTROINTESTINAL: No nausea, vomiting, diarrhea or abdominal pain.  GENITOURINARY: No dysuria, hematuria.  ENDOCRINE: No polyuria, nocturia,  HEMATOLOGY: No anemia, easy bruising or bleeding SKIN: No rash or lesion. MUSCULOSKELETAL: No joint pain or arthritis.   NEUROLOGIC: No tingling, numbness, weakness.  PSYCHIATRY: No anxiety or depression.   ROS  DRUG ALLERGIES:   Allergies  Allergen Reactions  . Calcium Channel Blockers     Would avoid if possible due to h/o peripheral edema    VITALS:  Blood pressure (!) 148/72, [name] 68, temperature 98.3 F (36.8 C), temperature source Oral, resp. rate 17, height 5\' 5"  (1.651 m), weight 118.8 kg (262 lb), SpO2 93 %.  PHYSICAL EXAMINATION:  GENERAL:  71 y.o.-year-old patient lying in the bed with no acute distress.  EYES: Pupils equal, round, reactive to light and accommodation. No scleral icterus. Extraocular muscles intact.  HEENT: Head atraumatic, normocephalic. Oropharynx and nasopharynx clear.  NECK:  Supple, no jugular venous distention. No thyroid enlargement, no tenderness.  LUNGS: Normal breath sounds bilaterally, no  wheezing, rales,rhonchi or crepitation. No use of accessory muscles of respiration.  CARDIOVASCULAR: S1, S2 normal. No murmurs, rubs, or gallops.  ABDOMEN: Soft, nontender, nondistended. Bowel sounds present. No organomegaly or mass.  EXTREMITIES: No pedal edema, cyanosis, or clubbing.  NEUROLOGIC: Cranial nerves II through XII are intact. Muscle strength 5/5 in all extremities. Sensation intact. Gait not checked.  PSYCHIATRIC: The patient is alert and oriented x 3.  SKIN: No obvious rash, lesion, or ulcer.   Physical Exam LABORATORY PANEL:   CBC  Recent Labs Lab 01/11/17 0912  WBC 7.9  HGB 13.6  HCT 40.8  PLT 185   ------------------------------------------------------------------------------------------------------------------  Chemistries   Recent Labs Lab 01/11/17 1807 01/12/17 0440  NA  --  135  K  --  3.8  CL  --  99*  CO2  --  28  GLUCOSE  --  284*  BUN  --  24*  CREATININE  --  1.69*  CALCIUM  --  8.7*  MG 1.8  --    ------------------------------------------------------------------------------------------------------------------  Cardiac Enzymes  Recent Labs Lab 01/11/17 0912 01/11/17 1807  TROPONINI 2.38* 10.00*   ------------------------------------------------------------------------------------------------------------------  RADIOLOGY:  Dg Chest 2 View  Result Date: 01/11/2017 CLINICAL DATA:  Chest pain and shortness of breath for 1 month. EXAM: CHEST  2 VIEW COMPARISON:  04/24/2013 FINDINGS: The heart is upper limits of normal in size and stable. Mild tortuosity and calcification of the thoracic aorta. Stable eventration of both hemidiaphragms, right greater than left. Moderate vascular congestion without overt pulmonary edema. Streaky areas of subsegmental atelectasis and scarring changes. No definite infiltrates or effusions. The bony thorax is  intact. Suspect changes of DISH. Advanced degenerative changes involving the right shoulder. IMPRESSION:  Moderate vascular congestion without overt pulmonary edema. No infiltrates or effusions. Electronically Signed   By: Marijo Sanes M.D.   On: 01/11/2017 09:42    ASSESSMENT AND PLAN:   Principal Problem:   NSTEMI (non-ST elevated myocardial infarction) ([place]) Active Problems:   Type 2 diabetes mellitus with renal complication (HCC)   CAD, NATIVE VESSEL   Chronic kidney disease, stage III (moderate)   PULMONARY FIBROSIS  * Non-STEMI. telemetry monitor.  heparin drip, aspirin, Plavix and Lipitor.   Cath done, with CAD, no stent placed- medical management.  * Hypertension. Accelerated. Continue hypertension medication, IV hydralazine when necessary.  Increased imdur.  As per cardio, If chest pain recures- may add renexa and for Htn- may increase hydralazine - if needed.  * Hypokalemia. Give potassium supplement, follow-up potassium and magnesium level.  * CKD stage III. Stable. Start normal saline IV for cardiac catheter. Follow-up BMP.  * Diabetes.  sliding scale. Consult diabetes coordinator for inpatient Lantus dosing. Morbid obesity and OSA. CPAP at nigh.  All the records are reviewed and case discussed with Care Management/Social Workerr. Management plans discussed with the patient, family and they are in agreement.  CODE STATUS: Full.  TOTAL TIME TAKING CARE OF THIS PATIENT: 35 minutes.  Cardio suggest to monitor on tele one night for better BP control.   POSSIBLE D/C IN 1-2 DAYS, DEPENDING ON CLINICAL CONDITION.   Vaughan Basta M.D on 01/12/2017   Between 7am to 6pm - Pager - [phone]  After 6pm go to [email or web address] - password [email or web address] [facility]  Office  [phone]  CC: Primary care physician; [name], MD  Note: This dictation was prepared with Dragon dictation along with smaller phrase technology. Any transcriptional errors that result from this process are unintentional.

## 2017-01-13 DIAGNOSIS — I5032 Chronic diastolic (congestive) heart failure: Secondary | ICD-10-CM

## 2017-01-13 DIAGNOSIS — I110 Hypertensive heart disease with heart failure: Secondary | ICD-10-CM | POA: Diagnosis present

## 2017-01-13 DIAGNOSIS — I5030 Unspecified diastolic (congestive) heart failure: Secondary | ICD-10-CM

## 2017-01-13 DIAGNOSIS — T82897A Other specified complication of cardiac prosthetic devices, implants and grafts, initial encounter: Secondary | ICD-10-CM | POA: Clinically undetermined

## 2017-01-13 DIAGNOSIS — I25119 Atherosclerotic heart disease of native coronary artery with unspecified angina pectoris: Secondary | ICD-10-CM

## 2017-01-13 DIAGNOSIS — E785 Hyperlipidemia, unspecified: Secondary | ICD-10-CM

## 2017-01-13 LAB — BASIC METABOLIC PANEL
Anion gap: 10 (ref 5–15)
BUN: 39 mg/dL — ABNORMAL HIGH (ref 6–20)
CO2: 28 mmol/L (ref 22–32)
Calcium: 8.7 mg/dL — ABNORMAL LOW (ref 8.9–10.3)
Chloride: 96 mmol/L — ABNORMAL LOW (ref 101–111)
Creatinine, Ser: 2.1 mg/dL — ABNORMAL HIGH (ref 0.61–1.24)
GFR calc Af Amer: 36 mL/min — ABNORMAL LOW (ref >60)
GFR calc non Af Amer: 31 mL/min — ABNORMAL LOW (ref >60)
Glucose, Bld: 285 mg/dL — ABNORMAL HIGH (ref 65–99)
Potassium: 3.7 mmol/L (ref 3.5–5.1)
Sodium: 134 mmol/L — ABNORMAL LOW (ref 135–145)

## 2017-01-13 LAB — GLUCOSE, CAPILLARY
Glucose-Capillary: 271 mg/dL — ABNORMAL HIGH (ref 65–99)
Glucose-Capillary: 307 mg/dL — ABNORMAL HIGH (ref 65–99)

## 2017-01-13 MED ORDER — AMLODIPINE BESYLATE 5 MG PO TABS
5.0000 mg | ORAL_TABLET | Freq: Every day | ORAL | 1 refills | Status: DC
Start: 2017-01-14 — End: 2017-02-28

## 2017-01-13 MED ORDER — FUROSEMIDE 40 MG PO TABS
40.0000 mg | ORAL_TABLET | Freq: Every day | ORAL | Status: DC
Start: 2017-01-13 — End: 2017-01-13
  Administered 2017-01-13: 40 mg via ORAL
  Filled 2017-01-13: qty 1

## 2017-01-13 MED ORDER — HYDRALAZINE HCL 50 MG PO TABS
75.0000 mg | ORAL_TABLET | Freq: Three times a day (TID) | ORAL | Status: DC
  Administered 2017-01-13: 75 mg via ORAL
  Filled 2017-01-13: qty 1

## 2017-01-13 MED ORDER — ISOSORBIDE MONONITRATE ER 30 MG PO TB24: 90.0000 mg | ORAL_TABLET | Freq: Two times a day (BID) | ORAL | 0 refills | Status: DC

## 2017-01-13 NOTE — Discharge Summary (Signed)
[place] [place] NAME: Stephen [name]    MR#:  [id number]  DATE OF BIRTH:  28-Nov-1945  DATE OF ADMISSION:  01/11/2017   ADMITTING PHYSICIAN: Demetrios Loll, MD  DATE OF DISCHARGE: 01/13/2017 PRIMARY CARE PHYSICIAN: [name], MD   ADMISSION DIAGNOSIS:  chest pain non ST segment elevation myocardial infarction DISCHARGE DIAGNOSIS:  Principal Problem:   NSTEMI (non-ST elevated myocardial infarction) ([place]) Active Problems:   Type 2 diabetes mellitus with renal complication (HCC)   SLEEP APNEA, OBSTRUCTIVE   Coronary artery disease involving native coronary artery of native heart with angina pectoris (HCC)   Chronic kidney disease, stage III (moderate)   PULMONARY FIBROSIS   Hyperlipidemia with target low density lipoprotein (LDL) cholesterol less than 70 mg/dL   Chronic diastolic CHF (congestive heart failure) ([place])   Coronary stent occlusion: Occluded circumflex stent   Accelerated hypertension with diastolic congestive heart failure, NYHA class 3 ([place]); elevated LVEDP on chronic catheterization.  SECONDARY DIAGNOSIS:   Past Medical History:  Diagnosis Date  . Adrenal gland anomaly   . Arthritis   . CAD (coronary artery disease)    s/p MI x 3 now with 2 stents  . CHF (congestive heart failure) ([place])   . Colon polyps   . Diabetes mellitus with neuropathy ([place])   . DM2 (diabetes mellitus, type 2) (HCC)    insulin requiring  . Dyslipidemia   . GERD (gastroesophageal reflux disease)   . Heart disease   . High cholesterol   . HTN (hypertension)   . Kidney disease   . Kidney stones   . Metabolic syndrome   . OSA (obstructive sleep apnea)   . 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 Recurrent UTI   . Renal artery stenosis (HCC)    L sided, stent 07/2013  . Rotator cuff disorder    right  . Skin cancer    head  . Thyroid disorder    HOSPITAL  COURSE:   * Non-STEMI.  He was treated with heparin drip. continue aspirin, Plavixand Lipitor.   Cath done, with CAD, no stent placed- medical management.  * Hypertension. Accelerated. Continue hypertension medication, IV hydralazine when necessary.  Increased imdur. Added Norvasc 5 mg daily.  As per cardio, If chest pain recures- may add renexa and for Htn- may increase hydralazine - if needed.  * Hypokalemia. Give potassium supplement, improved. * CKD stage III. Stable. He was treated with normal saline IV for cardiac catheter. Now on lasix. Creatinine is up to 2.1. Decreased Lasix to 40 mg by mouth daily.  Follow-up BMP as outpatient.  * Diabetes.   on Levemir and sliding scale. Resume Novolin 70/30 after discharge  Morbid obesityand OSA. CPAP at night. I suggested that patient stay one more day today and check renal function tomorrow. But the patient wants to go home today. I advised him and his wife follow-up renal function as outpatient. DISCHARGE CONDITIONS:  Stable, discharge to home today. CONSULTS OBTAINED:  Treatment Team:  Wellington Hampshire, MD Rise Mu, PA-C DRUG ALLERGIES:   Allergies  Allergen Reactions  . Calcium Channel Blockers     Would avoid if possible due to h/o peripheral edema   DISCHARGE MEDICATIONS:   Allergies as of 01/13/2017      Reactions   Calcium Channel Blockers    Would avoid if possible due to h/o peripheral  edema      Medication List    TAKE these medications   ACCU-CHEK AVIVA PLUS w/Device Kit Use to check blood sugar three times daily and as needed.  Diagnosis: E11.22  Insulin-dependent   ACCU-CHEK SOFTCLIX LANCETS lancets Use as instructed to check blood sugar three times daily and as needed.  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   amLODipine 5 MG tablet Commonly known as:  NORVASC Take 1 tablet (5 mg total) by mouth daily.   aspirin 81 MG tablet Take 324 mg by mouth daily.   atorvastatin 80 MG tablet Commonly  known as:  LIPITOR TAKE 1 TABLET AT BEDTIME   carvedilol 25 MG tablet Commonly known as:  COREG TAKE 1 TABLET TWICE DAILY WITH A MEAL   cloNIDine 0.3 MG tablet Commonly known as:  CATAPRES TAKE 1 TABLET THREE TIMES DAILY   clopidogrel 75 MG tablet Commonly known as:  PLAVIX Take 1 tablet (75 mg total) by mouth daily.   gabapentin 800 MG tablet Commonly known as:  NEURONTIN TAKE 1 TABLET IN THE MORNING  AND TAKE 1 AND 1/2 TABLETS AT BEDTIME   glucose blood test strip Commonly known as:  ACCU-CHEK AVIVA PLUS Use as instructed to check blood sugar 3 times daily or as needed.  Diagnosis:  E11.22  Insulin-dependent.   hydrALAZINE 50 MG tablet Commonly known as:  APRESOLINE Take 1 tablet (50 mg total) by mouth 3 (three) times daily.   insulin NPH-regular Human (70-30) 100 UNIT/ML injection Commonly known as:  NOVOLIN 70/30 INJECT  70 UNITS SUBCUTANEOUSLY EVERY MORNING  AND  35-45 UNITS IN THE EVENING What changed:  additional instructions   INSULIN SYRINGE 1CC/30GX5/16" 30G X 5/16" 1 ML Misc Use as directed to take insulin Dx 250.62   isosorbide mononitrate 30 MG 24 hr tablet Commonly known as:  IMDUR Take 3 tablets (90 mg total) by mouth 2 (two) times daily. What changed:  medication strength  how much to take   nitroGLYCERIN 0.4 MG SL tablet Commonly known as:  NITROSTAT Place 1 tablet (0.4 mg total) under the tongue every 5 (five) minutes as needed. May repeat x3   NON FORMULARY Oxygen 2 liters 24/7   omeprazole 20 MG capsule Commonly known as:  PRILOSEC Take 20 mg by mouth daily.   oxyCODONE 15 MG immediate release tablet Commonly known as:  ROXICODONE Take 1 tablet (15 mg total) by mouth every evening.   torsemide 20 MG tablet Commonly known as:  DEMADEX Take 2 tablets (40 mg total) by mouth daily.   triamterene-hydrochlorothiazide 37.5-25 MG tablet Commonly known as:  MAXZIDE-25 Take 1 tablet by mouth daily.        DISCHARGE INSTRUCTIONS:  See  AVS. If you experience worsening of your admission symptoms, develop shortness of breath, life threatening emergency, suicidal or homicidal thoughts you must seek medical attention immediately by calling 911 or calling your MD immediately  if symptoms less severe.  You Must read complete instructions/literature along with all the possible adverse reactions/side effects for all the Medicines you take and that have been prescribed to you. Take any new Medicines after you have completely understood and accpet all the possible adverse reactions/side effects.   Please note  You were cared for by a hospitalist during your hospital stay. If you have any questions about your discharge medications or the care you received while you were in the hospital after you are discharged, you can call the unit and asked to speak with the hospitalist on  call if the hospitalist that took care of you is not available. Once you are discharged, your primary care physician will handle any further medical issues. Please note that NO REFILLS for any discharge medications will be authorized once you are discharged, as it is imperative that you return to your primary care physician (or establish a relationship with a primary care physician if you do not have one) for your aftercare needs so that they can reassess your need for medications and monitor your lab values.    On the day of Discharge:  VITAL SIGNS:  Blood pressure (!) 147/71, pulse (!) 59, temperature 98.1 F (36.7 C), temperature source Oral, resp. rate 16, height _0  (1.651 m), weight 262 lb (118.8 kg), SpO2 97 %. PHYSICAL EXAMINATION:  GENERAL:  71 y.o.-year-old patient lying in the bed with no acute distress.Morbidly obese.  EYES: Pupils equal, round, reactive to light and accommodation. No scleral icterus. Extraocular muscles intact.  HEENT: Head atraumatic, normocephalic. Oropharynx and nasopharynx clear.  NECK:  Supple, no jugular venous distention. No  thyroid enlargement, no tenderness.  LUNGS: Normal breath sounds bilaterally, no wheezing, rales,rhonchi or crepitation. No use of accessory muscles of respiration.  CARDIOVASCULAR: S1, S2 normal. No murmurs, rubs, or gallops.  ABDOMEN: Soft, non-tender, non-distended. Bowel sounds present. No organomegaly or mass.  EXTREMITIES: No pedal edema, cyanosis, or clubbing.  NEUROLOGIC: Cranial nerves II through XII are intact. Muscle strength 5/5 in all extremities. Sensation intact. Gait not checked.  PSYCHIATRIC: The patient is alert and oriented x 3.  SKIN: No obvious rash, lesion, or ulcer.  DATA REVIEW:   CBC  Recent Labs Lab 01/11/17 0912  WBC 7.9  HGB 13.6  HCT 40.8  PLT 185    Chemistries   Recent Labs Lab 01/11/17 1807  01/13/17 0651  NA  --   < > 134*  K  --   < > 3.7  CL  --   < > 96*  CO2  --   < > 28  GLUCOSE  --   < > 285*  BUN  --   < > 39*  CREATININE  --   < > 2.10*  CALCIUM  --   < > 8.7*  MG 1.8  --   --   < > = values in this interval not displayed.   Microbiology Results  Results for orders placed or performed during the hospital encounter of 01/11/17  MRSA PCR Screening     Status: None   Collection Time: 01/11/17  6:07 PM  Result Value Ref Range Status   MRSA by PCR NEGATIVE NEGATIVE Final    Comment:        The GeneXpert MRSA Assay (FDA approved for NASAL specimens only), is one component of a comprehensive MRSA colonization surveillance program. It is not intended to diagnose MRSA infection nor to guide or monitor treatment for MRSA infections.     RADIOLOGY:  No results found.   Management plans discussed with the patient, His wife and they are in agreement.  CODE STATUS: Full Code   TOTAL TIME TAKING CARE OF THIS PATIENT: 35 minutes.    Demetrios Loll M.D on 01/13/2017 at 11:46 AM  Between 7am to 6pm - Pager - [phone]  After 6pm go to [email or web address] - password [email or web address] [facility]  [employer] [facility]  Office   [phone]  CC: Primary care physician; [name], MD   Note: This dictation was prepared with Dragon dictation along with smaller  Company secretary. Any transcriptional errors that result from this process are unintentional.

## 2017-01-13 NOTE — Discharge Instructions (Signed)
Heart healthy and ADA diet. Follow-up BMP with PCP.

## 2017-01-13 NOTE — Progress Notes (Signed)
Patient Name: Stephen [name] Date of Encounter: 01/13/2017  Primary Cardiologist: Rockey Situ  Patient Profile     71 y.o. male with history of CAD s/p prior MI and PCI/stenting to the mid LCx and mid RCA in 04/2006, interstitial fibrosis s/p wedge resection, on chronic oxygen, CKD stage III, HTN, HLD, DM2, and remote tobacco abuse who presented to [facility] with chest pain and was found to have a NSTEMI.  Troponin peaked at 10.  Cardiac catheterization with recommendation of medical management.  Hospital Problem List     Principal Problem:   NSTEMI (non-ST elevated myocardial infarction) ([place]) Active Problems:   Type 2 diabetes mellitus with renal complication (HCC)   Coronary artery disease involving native coronary artery of native heart with angina pectoris (HCC)   Chronic diastolic CHF (congestive heart failure) ([place])   Coronary stent occlusion: Occluded circumflex stent   Accelerated hypertension with diastolic congestive heart failure, NYHA class 3 ([place]); elevated LVEDP on chronic catheterization.   SLEEP APNEA, OBSTRUCTIVE   Chronic kidney disease, stage III (moderate)   PULMONARY FIBROSIS   Hyperlipidemia with target low density lipoprotein (LDL) cholesterol less than 70 mg/dL    Subjective   Feeling fine this AM.  No further CP or Dyspnea. Walking in hall - no Sx.  Inpatient Medications    Scheduled Meds: . amLODipine  5 mg Oral Daily  . aspirin EC  81 mg Oral Daily  . atorvastatin  80 mg Oral QHS  . carvedilol  25 mg Oral BID WC  . cloNIDine  0.3 mg Oral TID  . clopidogrel  75 mg Oral Daily  . enoxaparin (LOVENOX) injection  40 mg Subcutaneous Q24H  . furosemide  40 mg Oral Daily  . hydrALAZINE  75 mg Oral TID  . insulin aspart  0-5 Units Subcutaneous QHS  . insulin aspart  0-9 Units Subcutaneous TID WC  . insulin aspart  5 Units Subcutaneous TID WC  . insulin detemir  25 Units Subcutaneous BID  . isosorbide mononitrate  90 mg Oral BID  . oxyCODONE  15 mg Oral  QPM  . sodium chloride flush  3 mL Intravenous Q12H  . triamterene-hydrochlorothiazide  1 tablet Oral Daily   Continuous Infusions: . sodium chloride     PRN Meds: sodium chloride, acetaminophen, ALPRAZolam, hydrALAZINE, ipratropium-albuterol, morphine, nitroGLYCERIN, ondansetron (ZOFRAN) IV, sodium chloride flush, zolpidem   Vital Signs    Vitals:   01/12/17 1705 01/12/17 2018 01/13/17 0502 01/13/17 1027  BP: (!) 155/76 (!) 156/68 119/64 (!) 147/71  Pulse: 65 66 (!) 58 (!) 59  Resp:  18 16   Temp:  98.7 F (37.1 C) 98.1 F (36.7 C)   TempSrc:  Oral Oral   SpO2: 96% 95% 95% 97%  Weight:      Height:        Intake/Output Summary (Last 24 hours) at 01/13/17 1052 Last data filed at 01/12/17 2202  Gross per 24 hour  Intake            325.9 ml  Output             2500 ml  Net          -2174.1 ml   Filed Weights   01/11/17 0911  Weight: 262 lb (118.8 kg)    Physical Exam    General appearance: alert, cooperative, no distress and Resting comfortably in bed Neck: no adenopathy, no carotid bruit and no JVD Lungs: clear to auscultation bilaterally, normal percussion bilaterally  and Nonlabored, good air movement Heart: regular rate and rhythm, S1, S2 normal, no murmur, click, rub or gallop and normal apical impulse Abdomen: soft, non-tender; bowel sounds normal; no masses,  no organomegaly and Obese. Difficult to assess HJR or HSM Extremities: Maybe trace nonpitting edema. Otherwise normal Pulses: 2+ and symmetric Neurologic: Alert and oriented X 3, normal strength and tone. Normal symmetric reflexes. Normal coordination and gait  Labs    CBC  Recent Labs  01/11/17 0912  WBC 7.9  HGB 13.6  HCT 40.8  MCV 85.4  PLT 782   Basic Metabolic Panel  Recent Labs  01/11/17 1807 01/12/17 0440 01/13/17 0651  NA  --  135 134*  K  --  3.8 3.7  CL  --  99* 96*  CO2  --  28 28  GLUCOSE  --  284* 285*  BUN  --  24* 39*  CREATININE  --  1.69* 2.10*  CALCIUM  --  8.7*  8.7*  MG 1.8  --   --    Liver Function Tests No results for input(s): AST, ALT, ALKPHOS, BILITOT, PROT, ALBUMIN in the last 72 hours. No results for input(s): LIPASE, AMYLASE in the last 72 hours. Cardiac Enzymes  Recent Labs  01/11/17 0912 01/11/17 1807  TROPONINI 2.38* 10.00*   BNP Invalid input(s): POCBNP D-Dimer No results for input(s): DDIMER in the last 72 hours. Hemoglobin A1C  Recent Labs  01/11/17 1807  HGBA1C 7.0*   Fasting Lipid Panel  Recent Labs  01/12/17 0440  CHOL 141  HDL 22*  LDLCALC 87  TRIG 160*  CHOLHDL 6.4   Thyroid Function Tests No results for input(s): TSH, T4TOTAL, T3FREE, THYROIDAB in the last 72 hours.  Invalid input(s): FREET3  Telemetry    NSR - Personally Reviewed  ECG    n/a - Personally Reviewed  Radiology    Dg Chest 2 View  Result Date: 01/11/2017 IMPRESSION: Moderate vascular congestion without overt pulmonary edema. No infiltrates or effusions. Electronically Signed   By: Marijo Sanes M.D.   On: 01/11/2017 09:42    Cardiac Studies   LHC 01/11/2017: CTO of the LCx with collaterals from LAD and RCA. Patent RCA stent with 70% stenosis distal to the stent in the midsegment and 50% stenosis in the distal segment.    Mid Cx lesion, 100 %stenosed. - Most likely CTO.  LM lesion, 30 %stenosed. Prox LAD lesion, 30 %stenosed. Mid LAD lesion, 50 %stenosed. Dist LAD lesion, 30 %stenosed.  Prox RCA lesion, 20 %stenosed.  Mid RCA-1 stent, 10 %stenosed.  Mid RCA-2 lesion, 70 %stenosed. Distal to stent.  Dist RCA lesion, 50 %stenosed.  Post Atrio lesion, 40 %stenosed.   1. Significant 2 vessel coronary artery disease with occluded stent in the mid left circumflex with collaterals noted from the LAD and right coronary artery. Patent RCA stent with 70% stenosis distal to the stent in the midsegment and 50% stenosis in the distal segment. Moderate LAD disease.  2. Moderately elevated left ventricular end-diastolic pressure  with LVEDP of 25 mmHg.  Recommendations: The occluded mid left circumflex is likely the culprit for myocardial infarction but based on his symptoms of weeks duration and noted collaterals, I suspect that this is likely subacute. Due to presence of collaterals, I recommend optimizing medical therapy.  The patient is volume overloaded and needs diuresis. I ordered an echocardiogram. Intensify antianginal therapy. If the patient continues to have significant angina in spite of medical therapy, FFR based PCI of the  right coronary artery can be considered.     TTE 01/11/2017: Technically difficult. Moderate concentric LVH. EF 50-55%. Unable to assess wall motion. Unable to determine diastolic function.    Assessment & Plan    Principal Problem:   NSTEMI (non-ST elevated myocardial infarction) [facility]) / Coronary artery disease involving native coronary artery of native heart with angina pectoris ([place]) / Coronary stent occlusion: Occluded circumflex stent LHC on 7/12 showed CTO of the LCx with collaterals from LAD and RCA. Patent RCA stent with 70% stenosis distal to the stent in the midsegment and 50% stenosis in the distal segment  Was chest pain-free yesterday And today.  Plan for now is optimization of medical management:- Imdur increased to 90 mg daily. Aspirin and Plavix along with Coreg and Lipitor. Amlodipine added yesterday for antianginal and hypertension  As he is a demented, the plan is for him to be discharged today.  Future plans  Consider Ranexa if he has recurrent pain.  If symptoms persist, could consider FFR guided PCI of the RCA.    Chronic diastolic CHF (congestive heart failure) (HCC)/  Accelerated hypertension with diastolic congestive heart failure, NYHA class 3 ([place]); elevated LVEDP on chronic catheterization.  Blood pressure improved  Diastolic heart failure symptoms improved after IV Lasix. Continue by mouth Lasix at increased dose.  Nitroglycerin drip weaned off.  - Convert when necessary  Due to renal insufficiency, not on ARB/ACE inhibitor. He is on a combination of hydralazine plus nitrate  Unable to titrate up carvedilol - bradycardic and also at max dose.  On clonidine  Amlodipine added yesterday If pressure still are difficult to manage, options are titrating up amlodipine to 10 mg daily as first choice versus titrating hydralazine to 100 mg 3 times a day    Is also on Maxzide - need to closely watch in combination with Lasix.       Hyperlipidemia with target low density lipoprotein (LDL) cholesterol less than 70 mg/dL - On statin       Chronic kidney disease, stage III (moderate) limits use of ARB-  Closely follow renal function following cardiac catheterization. Suspect component of contrast nephropathy.    Type 2 diabetes mellitus with renal complication (HCC) - Per IM, on sliding scale    PULMONARY FIBROSIS / SLEEP APNEA, OBSTRUCTIVE -Per IM    -Ok to transfer out of ICU today, continue with telemetry -Will need to ambulate prior to discharge to evaluate for return of chest pain   Expected discharge today as he is stable now with no recurrent symptoms. We will arrange follow-up with his cardiologist.   Signed,  Glenetta Hew, M.D., M.S. Interventional Cardiologist   Pager # [phone] Phone # [phone] [address] [place], [place] [address]

## 2017-01-15 ENCOUNTER — Encounter: Payer: Self-pay | Admitting: Emergency Medicine

## 2017-01-15 ENCOUNTER — Telehealth: Payer: Self-pay | Admitting: Family Medicine

## 2017-01-15 ENCOUNTER — Inpatient Hospital Stay
Admission: EM | Admit: 2017-01-15 | Discharge: 2017-01-18 | DRG: 871 | Disposition: A | Discharge status: Home / Self Care | Payer: Medicare HMO | Attending: Internal Medicine | Admitting: Internal Medicine

## 2017-01-15 ENCOUNTER — Ambulatory Visit (INDEPENDENT_AMBULATORY_CARE_PROVIDER_SITE_OTHER)
Admission: EM | Admit: 2017-01-15 | Discharge: 2017-01-15 | Disposition: A | Discharge status: Home / Self Care | Payer: Medicare HMO | Source: Home / Self Care | Attending: Family Medicine | Admitting: Family Medicine

## 2017-01-15 ENCOUNTER — Encounter: Payer: Self-pay | Admitting: Gynecology

## 2017-01-15 ENCOUNTER — Other Ambulatory Visit: Payer: Self-pay

## 2017-01-15 ENCOUNTER — Emergency Department: Payer: Medicare HMO

## 2017-01-15 ENCOUNTER — Telehealth: Payer: Self-pay

## 2017-01-15 DIAGNOSIS — Z7982 Long term (current) use of aspirin: Secondary | ICD-10-CM | POA: Diagnosis not present

## 2017-01-15 DIAGNOSIS — Z888 Allergy status to other drugs, medicaments and biological substances status: Secondary | ICD-10-CM | POA: Diagnosis not present

## 2017-01-15 DIAGNOSIS — K219 Gastro-esophageal reflux disease without esophagitis: Secondary | ICD-10-CM | POA: Diagnosis present

## 2017-01-15 DIAGNOSIS — R748 Abnormal levels of other serum enzymes: Secondary | ICD-10-CM | POA: Diagnosis not present

## 2017-01-15 DIAGNOSIS — Z8249 Family history of ischemic heart disease and other diseases of the circulatory system: Secondary | ICD-10-CM

## 2017-01-15 DIAGNOSIS — R509 Fever, unspecified: Secondary | ICD-10-CM

## 2017-01-15 DIAGNOSIS — Z6841 Body Mass Index (BMI) 40.0 and over, adult: Secondary | ICD-10-CM

## 2017-01-15 DIAGNOSIS — E119 Type 2 diabetes mellitus without complications: Secondary | ICD-10-CM | POA: Diagnosis not present

## 2017-01-15 DIAGNOSIS — N3090 Cystitis, unspecified without hematuria: Secondary | ICD-10-CM

## 2017-01-15 DIAGNOSIS — J9611 Chronic respiratory failure with hypoxia: Secondary | ICD-10-CM | POA: Diagnosis not present

## 2017-01-15 DIAGNOSIS — R109 Unspecified abdominal pain: Secondary | ICD-10-CM

## 2017-01-15 DIAGNOSIS — I10 Essential (primary) hypertension: Secondary | ICD-10-CM | POA: Diagnosis not present

## 2017-01-15 DIAGNOSIS — R7881 Bacteremia: Secondary | ICD-10-CM | POA: Diagnosis not present

## 2017-01-15 DIAGNOSIS — A4151 Sepsis due to Escherichia coli [E. coli]: Principal | ICD-10-CM | POA: Diagnosis present

## 2017-01-15 DIAGNOSIS — Z955 Presence of coronary angioplasty implant and graft: Secondary | ICD-10-CM

## 2017-01-15 DIAGNOSIS — Z794 Long term (current) use of insulin: Secondary | ICD-10-CM

## 2017-01-15 DIAGNOSIS — R7989 Other specified abnormal findings of blood chemistry: Secondary | ICD-10-CM

## 2017-01-15 DIAGNOSIS — N179 Acute kidney failure, unspecified: Secondary | ICD-10-CM | POA: Diagnosis not present

## 2017-01-15 DIAGNOSIS — Z9981 Dependence on supplemental oxygen: Secondary | ICD-10-CM

## 2017-01-15 DIAGNOSIS — G4733 Obstructive sleep apnea (adult) (pediatric): Secondary | ICD-10-CM | POA: Diagnosis present

## 2017-01-15 DIAGNOSIS — Z87440 Personal history of urinary (tract) infections: Secondary | ICD-10-CM

## 2017-01-15 DIAGNOSIS — I5033 Acute on chronic diastolic (congestive) heart failure: Secondary | ICD-10-CM | POA: Diagnosis not present

## 2017-01-15 DIAGNOSIS — E1122 Type 2 diabetes mellitus with diabetic chronic kidney disease: Secondary | ICD-10-CM | POA: Diagnosis present

## 2017-01-15 DIAGNOSIS — N390 Urinary tract infection, site not specified: Secondary | ICD-10-CM | POA: Diagnosis present

## 2017-01-15 DIAGNOSIS — Z7902 Long term (current) use of antithrombotics/antiplatelets: Secondary | ICD-10-CM

## 2017-01-15 DIAGNOSIS — E1140 Type 2 diabetes mellitus with diabetic neuropathy, unspecified: Secondary | ICD-10-CM | POA: Diagnosis present

## 2017-01-15 DIAGNOSIS — R300 Dysuria: Secondary | ICD-10-CM | POA: Diagnosis not present

## 2017-01-15 DIAGNOSIS — I2510 Atherosclerotic heart disease of native coronary artery without angina pectoris: Secondary | ICD-10-CM | POA: Diagnosis not present

## 2017-01-15 DIAGNOSIS — E785 Hyperlipidemia, unspecified: Secondary | ICD-10-CM | POA: Diagnosis present

## 2017-01-15 DIAGNOSIS — R531 Weakness: Secondary | ICD-10-CM

## 2017-01-15 DIAGNOSIS — I130 Hypertensive heart and chronic kidney disease with heart failure and stage 1 through stage 4 chronic kidney disease, or unspecified chronic kidney disease: Secondary | ICD-10-CM | POA: Diagnosis not present

## 2017-01-15 DIAGNOSIS — N289 Disorder of kidney and ureter, unspecified: Secondary | ICD-10-CM | POA: Diagnosis not present

## 2017-01-15 DIAGNOSIS — R350 Frequency of micturition: Secondary | ICD-10-CM | POA: Diagnosis present

## 2017-01-15 DIAGNOSIS — R0602 Shortness of breath: Secondary | ICD-10-CM

## 2017-01-15 DIAGNOSIS — I214 Non-ST elevation (NSTEMI) myocardial infarction: Secondary | ICD-10-CM | POA: Diagnosis present

## 2017-01-15 DIAGNOSIS — Z87891 Personal history of nicotine dependence: Secondary | ICD-10-CM

## 2017-01-15 DIAGNOSIS — E1129 Type 2 diabetes mellitus with other diabetic kidney complication: Secondary | ICD-10-CM | POA: Diagnosis present

## 2017-01-15 DIAGNOSIS — A419 Sepsis, unspecified organism: Secondary | ICD-10-CM | POA: Diagnosis not present

## 2017-01-15 DIAGNOSIS — N3000 Acute cystitis without hematuria: Secondary | ICD-10-CM | POA: Diagnosis not present

## 2017-01-15 DIAGNOSIS — Z79899 Other long term (current) drug therapy: Secondary | ICD-10-CM | POA: Diagnosis not present

## 2017-01-15 DIAGNOSIS — I5032 Chronic diastolic (congestive) heart failure: Secondary | ICD-10-CM | POA: Diagnosis present

## 2017-01-15 DIAGNOSIS — N189 Chronic kidney disease, unspecified: Secondary | ICD-10-CM

## 2017-01-15 DIAGNOSIS — N183 Chronic kidney disease, stage 3 (moderate): Secondary | ICD-10-CM | POA: Diagnosis not present

## 2017-01-15 DIAGNOSIS — R3915 Urgency of urination: Secondary | ICD-10-CM | POA: Diagnosis present

## 2017-01-15 DIAGNOSIS — R778 Other specified abnormalities of plasma proteins: Secondary | ICD-10-CM | POA: Diagnosis present

## 2017-01-15 LAB — PROTIME-INR
INR: 1.12
Prothrombin Time: 14.5 seconds (ref 11.4–15.2)

## 2017-01-15 LAB — CBC WITH DIFFERENTIAL/PLATELET
Basophils Absolute: 0 10*3/uL (ref 0–0.1)
Basophils Relative: 0 %
Eosinophils Absolute: 0 10*3/uL (ref 0–0.7)
Eosinophils Relative: 0 %
HCT: 40.5 % (ref 40.0–52.0)
Hemoglobin: 13.8 g/dL (ref 13.0–18.0)
Lymphocytes Relative: 3 %
Lymphs Abs: 0.7 10*3/uL — ABNORMAL LOW (ref 1.0–3.6)
MCH: 29 pg (ref 26.0–34.0)
MCHC: 34 g/dL (ref 32.0–36.0)
MCV: 85.2 fL (ref 80.0–100.0)
Monocytes Absolute: 2.3 10*3/uL — ABNORMAL HIGH (ref 0.2–1.0)
Monocytes Relative: 10 %
Neutro Abs: 20.1 10*3/uL — ABNORMAL HIGH (ref 1.4–6.5)
Neutrophils Relative %: 87 %
Platelets: 275 10*3/uL (ref 150–440)
RBC: 4.75 MIL/uL (ref 4.40–5.90)
RDW: 14.9 % — ABNORMAL HIGH (ref 11.5–14.5)
WBC: 23.1 10*3/uL — ABNORMAL HIGH (ref 3.8–10.6)

## 2017-01-15 LAB — URINALYSIS, COMPLETE (UACMP) WITH MICROSCOPIC
Bilirubin Urine: NEGATIVE
Bilirubin Urine: NEGATIVE
Glucose, UA: NEGATIVE mg/dL
Glucose, UA: NEGATIVE mg/dL
Ketones, ur: NEGATIVE mg/dL
Ketones, ur: NEGATIVE mg/dL
Nitrite: POSITIVE — AB
Nitrite: NEGATIVE
Protein, ur: 100 mg/dL — AB
Protein, ur: 100 mg/dL — AB
Specific Gravity, Urine: 1.01 (ref 1.005–1.030)
Specific Gravity, Urine: 1.014 (ref 1.005–1.030)
Squamous Epithelial / LPF: NONE SEEN
pH: 5 (ref 5.0–8.0)
pH: 5 (ref 5.0–8.0)

## 2017-01-15 LAB — COMPREHENSIVE METABOLIC PANEL
ALT: 68 U/L — ABNORMAL HIGH (ref 17–63)
AST: 80 U/L — ABNORMAL HIGH (ref 15–41)
Albumin: 3.1 g/dL — ABNORMAL LOW (ref 3.5–5.0)
Alkaline Phosphatase: 80 U/L (ref 38–126)
Anion gap: 13 (ref 5–15)
BUN: 63 mg/dL — ABNORMAL HIGH (ref 6–20)
CO2: 25 mmol/L (ref 22–32)
Calcium: 8.7 mg/dL — ABNORMAL LOW (ref 8.9–10.3)
Chloride: 95 mmol/L — ABNORMAL LOW (ref 101–111)
Creatinine, Ser: 3.07 mg/dL — ABNORMAL HIGH (ref 0.61–1.24)
GFR calc Af Amer: 22 mL/min — ABNORMAL LOW (ref >60)
GFR calc non Af Amer: 19 mL/min — ABNORMAL LOW (ref >60)
Glucose, Bld: 106 mg/dL — ABNORMAL HIGH (ref 65–99)
Potassium: 3.2 mmol/L — ABNORMAL LOW (ref 3.5–5.1)
Sodium: 133 mmol/L — ABNORMAL LOW (ref 135–145)
Total Bilirubin: 1.3 mg/dL — ABNORMAL HIGH (ref 0.3–1.2)
Total Protein: 7.6 g/dL (ref 6.5–8.1)

## 2017-01-15 LAB — PROCALCITONIN: Procalcitonin: 0.86 ng/mL

## 2017-01-15 LAB — APTT: aPTT: 31 seconds (ref 24–36)

## 2017-01-15 LAB — TROPONIN I: Troponin I: 16.35 ng/mL (ref <0.03)

## 2017-01-15 LAB — LACTIC ACID, PLASMA: Lactic Acid, Venous: 1 mmol/L (ref 0.5–1.9)

## 2017-01-15 LAB — LIPASE, BLOOD: Lipase: 20 U/L (ref 11–51)

## 2017-01-15 MED ORDER — SODIUM CHLORIDE 0.9 % IV BOLUS (SEPSIS)
1000.0000 mL | Freq: Once | INTRAVENOUS | Status: AC
  Administered 2017-01-15: 1000 mL via INTRAVENOUS

## 2017-01-15 MED ORDER — ACETAMINOPHEN 500 MG PO TABS
1000.0000 mg | ORAL_TABLET | Freq: Once | ORAL | Status: AC
  Administered 2017-01-15: 1000 mg via ORAL

## 2017-01-15 MED ORDER — LACTATED RINGERS IV SOLN
INTRAVENOUS | Status: AC
  Administered 2017-01-15: 22:00:00 via INTRAVENOUS

## 2017-01-15 MED ORDER — ACETAMINOPHEN 500 MG PO TABS: 500.0000 mg | ORAL_TABLET | Freq: Once | ORAL | Status: DC

## 2017-01-15 MED ORDER — DEXTROSE 5 % IV SOLN
1.0000 g | INTRAVENOUS | Status: DC
  Filled 2017-01-15: qty 10

## 2017-01-15 MED ORDER — DEXTROSE 5 % IV SOLN
1.0000 g | Freq: Once | INTRAVENOUS | Status: AC
  Administered 2017-01-15: 1 g via INTRAVENOUS
  Filled 2017-01-15: qty 10

## 2017-01-15 NOTE — Progress Notes (Signed)
Pharmacy Antibiotic Note  Stephen [name] is a 71 y.o. male admitted on 01/15/2017 with sepsis and UTI.  Pharmacy has been consulted for Ceftriaxone dosing.  Plan: Ceftriaxone 1g IV every 24 hours.  Height: 5\' 5"  (165.1 cm) Weight: 262 lb (118.8 kg) IBW/kg (Calculated) : 61.5  Temp (24hrs), Avg:99.8 F (37.7 C), Min:99.4 F (37.4 C), Max:100.5 F (38.1 C)   Recent Labs Lab 01/11/17 0912 01/12/17 0440 01/13/17 0651 01/15/17 1959  WBC 7.9  --   --   --   CREATININE 1.88* 1.69* 2.10* 3.07*  LATICACIDVEN  --   --   --  1.0    Estimated Creatinine Clearance: 27.5 mL/min (A) (by C-G formula based on SCr of 3.07 mg/dL (H)).    Allergies  Allergen Reactions  . Calcium Channel Blockers     Would avoid if possible due to h/o peripheral edema    Antimicrobials this admission: Ceftriaxone 7/16 >>    Dose adjustments this admission: N/A  Microbiology results: 7/16 BCx: pending 7/16 UCx: pending   Thank you for allowing pharmacy to be a part of this patient's care.  Olivia Canter, [facility] 01/15/2017 9:03 PM

## 2017-01-15 NOTE — ED Triage Notes (Signed)
Pt arrived to the ED accompanied by his wife, referred from [facility] [address] for suspected sepsis. Pt reported that he was hospitalized and went under craterization for a NSTEMI on the 12th. For the last 3 days he has been experiencing dysuria and weakness. Pt is AOx4 with weakness during triage.

## 2017-01-15 NOTE — Discharge Instructions (Signed)
Recommend go to ER now for further evaluation and treatment.

## 2017-01-15 NOTE — ED Provider Notes (Signed)
[facility] Emergency Department Provider Note  ____________________________________________  Time seen: Approximately 9:22 PM  I have reviewed the triage vital signs and the nursing notes.   HISTORY  Chief Complaint Dysuria    HPI BIRDIE BEVERIDGE is a 71 y.o. male who complains of dysuria and urinary frequency and urgency for the past 3 days, associated with generalized weakness. Denies any chest pain or shortness of breath. No orthopnea. Reports a fever to 100.5 at home and chills. No dizziness. He received Tylenol for the fever prior to arrival at [facility]. Urinary symptoms are intermittent, lasting several minutes at a time, nonradiating. No alleviating factors.     Past Medical History:  Diagnosis Date  . Adrenal gland anomaly   . Arthritis   . CAD (coronary artery disease)    s/p MI x 3 now with 2 stents  . CHF (congestive heart failure) ([place])   . Colon polyps   . Diabetes mellitus with neuropathy ([place])   . DM2 (diabetes mellitus, type 2) (HCC)    insulin requiring  . Dyslipidemia   . GERD (gastroesophageal reflux disease)   . Heart disease   . High cholesterol   . HTN (hypertension)   . Kidney disease   . Kidney stones   . Metabolic syndrome   . OSA (obstructive sleep apnea)   . 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 Recurrent UTI   . Renal artery stenosis (HCC)    L sided, stent 07/2013  . Rotator cuff disorder    right  . Skin cancer    head  . Thyroid disorder      Patient Active Problem List   Diagnosis Date Noted  . Coronary stent occlusion: Occluded circumflex stent 01/13/2017  . Accelerated hypertension with diastolic congestive heart failure, NYHA class 3 ([place]); elevated LVEDP on chronic catheterization. 01/13/2017  . NSTEMI (non-ST elevated myocardial infarction) ([place]) 01/11/2017  . Blood in urine 12/08/2016  . Lymphedema 10/23/2016   . Low back pain with right-sided sciatica 04/03/2014  . Renal artery stenosis, native ([name]) 08/07/2013  . Muscle spasm 04/24/2013  . Chronic edema 11/13/2012  . Leg edema, right 09/29/2012  . SK (seborrheic keratosis) 09/29/2012  . Chronic diastolic CHF (congestive heart failure) ([place]) 12/15/2011  . Sebaceous cyst 11/28/2011  . Morbid obesity ([place]) 07/27/2011  . Hyperlipidemia with target low density lipoprotein (LDL) cholesterol less than 70 mg/dL 04/26/2011  . Adrenal adenoma 03/02/2011  . Back pain 12/06/2010  . Kidney stones 12/06/2010  . Essential hypertension, benign 10/28/2010  . PULMONARY FIBROSIS 09/15/2010  . Coronary artery disease involving native coronary artery of native heart with angina pectoris ([place]) 09/01/2010  . Type 2 diabetes mellitus with renal complication ([place]) 02/58/5277  . METABOLIC SYNDROME X 82/42/3536  . SLEEP APNEA, OBSTRUCTIVE 08/31/2010  . Chronic kidney disease, stage III (moderate) 08/31/2010     Past Surgical History:  Procedure Laterality Date  . adrenal adenoma removal  1990-   right  . CARPAL TUNNEL RELEASE     bilateral  . CORONARY ANGIOPLASTY WITH STENT PLACEMENT  2007   x 2  . KNEE SURGERY     right  . LEFT HEART CATH AND CORONARY ANGIOGRAPHY N/A 01/11/2017   Procedure: Left Heart Cath and Coronary Angiography;  Surgeon: Wellington Hampshire, MD;  Location: [place] [facility];  Service: Cardiovascular;  Laterality: N/A;  . LUNG BIOPSY    .  RENAL ARTERY STENT  2015   L  . SKIN CANCER EXCISION     back of head     Prior to Admission medications   Medication Sig Start Date End Date Taking? Authorizing Provider  ACCU-CHEK SOFTCLIX LANCETS lancets Use as instructed to check blood sugar three times daily and as needed.  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 03/09/16   [name], MD  amLODipine (NORVASC) 5 MG tablet Take 1 tablet (5 mg total) by mouth daily. 01/14/17   Demetrios Loll, MD  aspirin 81 MG tablet Take 324 mg by mouth daily.      [provider]  atorvastatin (LIPITOR) 80 MG tablet TAKE 1 TABLET AT BEDTIME 02/03/16   [name], MD  Blood Glucose Monitoring Suppl (ACCU-CHEK AVIVA PLUS) w/Device KIT Use to check blood sugar three times daily and as needed.  Diagnosis: E11.22  Insulin-dependent 03/02/16   [name], MD  carvedilol (COREG) 25 MG tablet TAKE 1 TABLET TWICE DAILY WITH A MEAL 02/03/16   [name], MD  cloNIDine (CATAPRES) 0.3 MG tablet TAKE 1 TABLET THREE TIMES DAILY 10/27/15   Minna Merritts, MD  clopidogrel (PLAVIX) 75 MG tablet Take 1 tablet (75 mg total) by mouth daily. 08/07/16   Minna Merritts, MD  gabapentin (NEURONTIN) 800 MG tablet TAKE 1 TABLET IN THE MORNING  AND TAKE 1 AND 1/2 TABLETS AT BEDTIME 10/25/16   [name], MD  glucose blood (ACCU-CHEK AVIVA PLUS) test strip Use as instructed to check blood sugar 3 times daily or as needed.  Diagnosis:  E11.22  Insulin-dependent. 03/02/16   [name], MD  hydrALAZINE (APRESOLINE) 50 MG tablet Take 1 tablet (50 mg total) by mouth 3 (three) times daily. 06/06/16   Minna Merritts, MD  insulin NPH-regular Human (NOVOLIN 70/30) (70-30) 100 UNIT/ML injection INJECT  70 UNITS SUBCUTANEOUSLY EVERY MORNING  AND  35-45 UNITS IN THE EVENING Patient taking differently: INJECT  70 UNITS SUBCUTANEOUSLY EVERY MORNING  AND  50 units at bedtime 09/30/14   [name], MD  Insulin Syringe-Needle U-100 (INSULIN SYRINGE 1CC/30GX5/16") 30G X 5/16" 1 ML MISC Use as directed to take insulin Dx 250.62 07/14/13   Ria Bush, MD  isosorbide mononitrate (IMDUR) 30 MG 24 hr tablet Take 3 tablets (90 mg total) by mouth 2 (two) times daily. 01/13/17   Demetrios Loll, MD  nitroGLYCERIN (NITROSTAT) 0.4 MG SL tablet Place 1 tablet (0.4 mg total) under the tongue every 5 (five) minutes as needed. May repeat x3 12/16/13   [name], MD  NON FORMULARY Oxygen 2 liters 24/7    [provider]  omeprazole (PRILOSEC) 20 MG capsule Take  20 mg by mouth daily.      [provider]  oxyCODONE (ROXICODONE) 15 MG immediate release tablet Take 1 tablet (15 mg total) by mouth every evening. 12/07/16   [name], MD  torsemide (DEMADEX) 20 MG tablet Take 2 tablets (40 mg total) by mouth daily. 01/05/17   Minna Merritts, MD  triamterene-hydrochlorothiazide (MAXZIDE-25) 37.5-25 MG tablet Take 1 tablet by mouth daily. 11/04/15   Minna Merritts, MD     Allergies Calcium channel blockers   Family History  Problem Relation Age of Onset  . Heart attack Father   . Heart attack Mother   . Heart attack Brother   . COPD Sister   . Arthritis Maternal Grandmother   . Breast cancer Maternal Aunt   .  Diabetes Maternal Aunt   . Hypertension Other   . Hypertension Maternal Aunt     Social History Social History  Substance Use Topics  . Smoking status: Never Smoker  . Smokeless tobacco: Never Used  . Alcohol use No    Review of Systems  Constitutional:   Positive fever and chills.  ENT:   No sore throat. No rhinorrhea. Cardiovascular:   No chest pain or syncope. Respiratory:   No dyspnea or cough. Gastrointestinal:   Positive suprapubic abdominal pain without vomiting diarrhea or constipation.  Musculoskeletal:   Negative for focal pain or swelling All other systems reviewed and are negative except as documented above in ROS and HPI.  ____________________________________________   PHYSICAL EXAM:  VITAL SIGNS: ED Triage Vitals  Enc Vitals Group     BP 01/15/17 1946 (!) 89/44     Pulse Rate 01/15/17 1946 76     Resp 01/15/17 1946 20     Temp 01/15/17 1946 99.4 F (37.4 C)     Temp Source 01/15/17 1946 Oral     SpO2 01/15/17 1946 94 %     Weight 01/15/17 1947 262 lb (118.8 kg)     Height 01/15/17 1947 '5\' 5"'$  (1.651 m)     Head Circumference --      Peak Flow --      Pain Score 01/15/17 1944 7     Pain Loc --      Pain Edu? --      Excl. in [place]? --     Vital signs reviewed, nursing assessments  reviewed.   Constitutional:   Alert and oriented. Not in distress, ill-appearing. Eyes:   No scleral icterus.  EOMI. No nystagmus. No conjunctival pallor. PERRL. ENT   Head:   Normocephalic and atraumatic.   Nose:   No congestion/rhinnorhea.    Mouth/Throat:   Dry mucous membranes, no pharyngeal erythema. No peritonsillar mass.    Neck:   No meningismus. Full ROM Hematological/Lymphatic/Immunilogical:   No cervical lymphadenopathy. Cardiovascular:   RRR. Symmetric bilateral radial and DP pulses.  No murmurs.  Respiratory:   Normal respiratory effort without tachypnea/retractions. Breath sounds are clear and equal bilaterally. No wheezes/rales/rhonchi. Gastrointestinal:   Soft with suprapubic tenderness. Non distended. There is no CVA tenderness.  No rebound, rigidity, or guarding. Genitourinary:   deferred Musculoskeletal:   Normal range of motion in all extremities. No joint effusions.  No lower extremity tenderness.  No edema. Neurologic:   Normal speech and language.  Motor grossly intact. No gross focal neurologic deficits are appreciated.  Skin:    Skin is warm, dry and intact. No rash noted.  No petechiae, purpura, or bullae.  ____________________________________________    LABS (pertinent positives/negatives) (all labs ordered are listed, but only abnormal results are displayed) Labs Reviewed  COMPREHENSIVE METABOLIC PANEL - Abnormal; Notable for the following:       Result Value   Sodium 133 (*)    Potassium 3.2 (*)    Chloride 95 (*)    Glucose, Bld 106 (*)    BUN 63 (*)    Creatinine, Ser 3.07 (*)    Calcium 8.7 (*)    Albumin 3.1 (*)    AST 80 (*)    ALT 68 (*)    Total Bilirubin 1.3 (*)    GFR calc non Af Amer 19 (*)    GFR calc Af Amer 22 (*)    All other components within normal limits  TROPONIN I - Abnormal; Notable for  the following:    Troponin I 16.35 (*)    All other components within normal limits  CULTURE, BLOOD (ROUTINE X 2)  CULTURE,  BLOOD (ROUTINE X 2)  URINE CULTURE  LACTIC ACID, PLASMA  LIPASE, BLOOD  APTT  PROTIME-INR  LACTIC ACID, PLASMA  CBC WITH DIFFERENTIAL/PLATELET  PROCALCITONIN  URINALYSIS, COMPLETE (UACMP) WITH MICROSCOPIC   ____________________________________________   EKG  Interpreted by me Sinus rhythm rate of 71, left axis, normal intervals. Poor R-wave progression in anterior precordial leads. Interventricular conduction delay. No acute ischemic changes. This is improved compared to 01/12/2017 which showed slight ST elevation in aVR and slight ST depression in lead 2  ____________________________________________    RADIOLOGY  Dg Chest Port 1 View  Result Date: 01/15/2017 CLINICAL DATA:  Sepsis. Patient underwent catheterization for non ST MI on the twelfth. Dysuria and weakness for the past 3 days. EXAM: PORTABLE CHEST 1 VIEW COMPARISON:  None. FINDINGS: Heart size is top normal in size and stable with aortic atherosclerosis. No aortic aneurysm. No pneumonic consolidation. Chronic elevation and/or eventration of the right hemidiaphragm with bibasilar atelectasis and/or scarring. No overt pulmonary edema. No effusions. 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 IMPRESSION: 1. Borderline cardiomegaly with aortic atherosclerosis. 2. Bibasilar atelectasis and/or scarring. No acute pulmonary disease. Electronically Signed   By: Ashley Royalty M.D.   On: 01/15/2017 20:35    ____________________________________________   PROCEDURES Procedures  ____________________________________________   INITIAL IMPRESSION / ASSESSMENT AND PLAN / ED COURSE  Pertinent labs & imaging results that were available during my care of the patient were reviewed by me and considered in my medical decision making (see chart for details).    Clinical Course as of Jan 15 2121  Mon Jan 15, 2017  2009 Had one measurement of hypotension, had temp 100.5 at home. Will call code sepsis. BP normalized  currently.   [PS]    Clinical Course User Index [PS] Carrie Mew, MD    ----------------------------------------- 9:24 PM on 01/15/2017 -----------------------------------------  Workup reveals nitrite positive UTI. This is associated with acute on chronic renal insufficiency with a creatinine now of 3. He also has a troponin of 16 which is increased from 10 during his last hospitalization on 01/11/2017, during which he underwent a cardiac catheterization and was felt to have subacute ischemia. It is unclear currently if his troponin is going up or going down. He has no acute cardiac symptoms. I suspect that the creatinine elevation is due to sepsis and pyelonephritis, but it could possibly be due to hypoperfusion from his recent MI versus contrast nephropathy. We'll discuss with the hospitalist for admission. She has received ceftriaxone and IV fluid boluses. Blood pressure remained stable after the initial episode of hypotension.   ____________________________________________   FINAL CLINICAL IMPRESSION(S) / ED DIAGNOSES  Final diagnoses:  Acute renal insufficiency  Elevated troponin  Cystitis      New Prescriptions   No medications on file     Portions of this note were generated with dragon dictation software. Dictation errors may occur despite best attempts at proofreading.    Carrie Mew, MD 01/15/17 2129

## 2017-01-15 NOTE — H&P (Signed)
[facility] - [place] [facility]   PATIENT NAME: Stephen [name]    MR#:  [id number]  DATE OF BIRTH:  [birth year]  DATE OF ADMISSION:  01/15/2017  PRIMARY CARE PHYSICIAN: [name], MD   REQUESTING/REFERRING PHYSICIAN: Scotty Court, MD  CHIEF COMPLAINT:   Chief Complaint  Patient presents with  . Dysuria    HISTORY OF PRESENT ILLNESS:  Stephen [name]  is a 71 y.o. male who presents with Dysuria. Patient states that for the past couple of days he's had significant burning with urination. He came to the ED today and was found to have an elevated white count and a UTI. He states he had a fever at home. His blood pressure is okay and his lactic acid is okay. Hospitalists were called for admission  PAST MEDICAL HISTORY:   Past Medical History:  Diagnosis Date  . Adrenal gland anomaly   . Arthritis   . CAD (coronary artery disease)    s/p MI x 3 now with 2 stents  . CHF (congestive heart failure) (HCC)   . Colon polyps   . Diabetes mellitus with neuropathy (HCC)   . DM2 (diabetes mellitus, type 2) (HCC)    insulin requiring  . Dyslipidemia   . GERD (gastroesophageal reflux disease)   . Heart disease   . High cholesterol   . HTN (hypertension)   . Kidney disease   . Kidney stones   . Metabolic syndrome   . OSA (obstructive sleep apnea)   . 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 Recurrent UTI   . Renal artery stenosis (HCC)    L sided, stent 07/2013  . Rotator cuff disorder    right  . Skin cancer    head  . Thyroid disorder     PAST SURGICAL HISTORY:   Past Surgical History:  Procedure Laterality Date  . adrenal adenoma removal  1990-   right  . CARPAL TUNNEL RELEASE     bilateral  . CORONARY ANGIOPLASTY WITH STENT PLACEMENT  2007   x 2  . KNEE SURGERY     right  . LEFT HEART CATH AND CORONARY ANGIOGRAPHY N/A 01/11/2017   Procedure: Left Heart Cath and  Coronary Angiography;  Surgeon: Iran Ouch, MD;  Location: [facility] INVASIVE CV LAB;  Service: Cardiovascular;  Laterality: N/A;  . LUNG BIOPSY    . RENAL ARTERY STENT  2015   L  . SKIN CANCER EXCISION     back of head    SOCIAL HISTORY:   Social History  Substance Use Topics  . Smoking status: Never Smoker  . Smokeless tobacco: Never Used  . Alcohol use No    FAMILY HISTORY:   Family History  Problem Relation Age of Onset  . Heart attack Father   . Heart attack Mother   . Heart attack Brother   . COPD Sister   . Arthritis Maternal Grandmother   . Breast cancer Maternal Aunt   . Diabetes Maternal Aunt   . Hypertension Other   . Hypertension Maternal Aunt     DRUG ALLERGIES:   Allergies  Allergen Reactions  . Calcium Channel Blockers     Would avoid if possible due to h/o peripheral edema    MEDICATIONS AT HOME:   Prior to Admission medications   Medication Sig Start Date End Date Taking? Authorizing Provider  amLODipine (NORVASC) 5 MG tablet Take 1  tablet (5 mg total) by mouth daily. 01/14/17  Yes Shaune Pollack, MD  aspirin 81 MG tablet Take 324 mg by mouth daily.    Yes [provider]  atorvastatin (LIPITOR) 80 MG tablet TAKE 1 TABLET AT BEDTIME 02/03/16  Yes [name], MD  carvedilol (COREG) 25 MG tablet TAKE 1 TABLET TWICE DAILY WITH A MEAL 02/03/16  Yes [name], MD  cloNIDine (CATAPRES) 0.3 MG tablet TAKE 1 TABLET THREE TIMES DAILY 10/27/15  Yes Gollan, Tollie Pizza, MD  clopidogrel (PLAVIX) 75 MG tablet Take 1 tablet (75 mg total) by mouth daily. 08/07/16  Yes Antonieta Iba, MD  gabapentin (NEURONTIN) 800 MG tablet TAKE 1 TABLET IN THE MORNING  AND TAKE 1 AND 1/2 TABLETS AT BEDTIME 10/25/16  Yes [name], MD  hydrALAZINE (APRESOLINE) 50 MG tablet Take 1 tablet (50 mg total) by mouth 3 (three) times daily. 06/06/16  Yes Gollan, Tollie Pizza, MD  insulin NPH-regular Human (NOVOLIN 70/30) (70-30) 100 UNIT/ML injection INJECT  70 UNITS  SUBCUTANEOUSLY EVERY MORNING  AND  35-45 UNITS IN THE EVENING Patient taking differently: INJECT  70 UNITS SUBCUTANEOUSLY EVERY MORNING  AND  50 units at bedtime 09/30/14  Yes [name], MD  isosorbide mononitrate (IMDUR) 30 MG 24 hr tablet Take 3 tablets (90 mg total) by mouth 2 (two) times daily. 01/13/17  Yes Shaune Pollack, MD  nitroGLYCERIN (NITROSTAT) 0.4 MG SL tablet Place 1 tablet (0.4 mg total) under the tongue every 5 (five) minutes as needed. May repeat x3 12/16/13  Yes [name], MD  omeprazole (PRILOSEC) 20 MG capsule Take 20 mg by mouth daily.     Yes [provider]  oxyCODONE (ROXICODONE) 15 MG immediate release tablet Take 1 tablet (15 mg total) by mouth every evening. 12/07/16  Yes [name], MD  torsemide (DEMADEX) 20 MG tablet Take 2 tablets (40 mg total) by mouth daily. 01/05/17  Yes Antonieta Iba, MD  triamterene-hydrochlorothiazide (MAXZIDE-25) 37.5-25 MG tablet Take 1 tablet by mouth daily. 11/04/15  Yes Gollan, Tollie Pizza, MD  ACCU-CHEK SOFTCLIX LANCETS lancets Use as instructed to check blood sugar three times daily and as needed.  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 03/09/16   [name], MD  Blood Glucose Monitoring Suppl (ACCU-CHEK AVIVA PLUS) w/Device KIT Use to check blood sugar three times daily and as needed.  Diagnosis: E11.22  Insulin-dependent 03/02/16   [name], MD  glucose blood (ACCU-CHEK AVIVA PLUS) test strip Use as instructed to check blood sugar 3 times daily or as needed.  Diagnosis:  E11.22  Insulin-dependent. 03/02/16   [name], MD  Insulin Syringe-Needle U-100 (INSULIN SYRINGE 1CC/30GX5/16") 30G X 5/16" 1 ML MISC Use as directed to take insulin Dx 250.62 07/14/13   Eustaquio Boyden, MD  NON FORMULARY Oxygen 2 liters 24/7    [provider]    REVIEW OF SYSTEMS:  Review of Systems  Constitutional: Positive for fever and malaise/fatigue. Negative for chills and weight loss.  HENT: Negative for ear pain,  hearing loss and tinnitus.   Eyes: Negative for blurred vision, double vision, pain and redness.  Respiratory: Negative for cough, hemoptysis and shortness of breath.   Cardiovascular: Negative for chest pain, palpitations, orthopnea and leg swelling.  Gastrointestinal: Negative for abdominal pain, constipation, diarrhea, nausea and vomiting.  Genitourinary: Positive for dysuria. Negative for frequency and hematuria.  Musculoskeletal: Negative for back pain, joint pain and neck pain.  Skin:  No acne, rash, or lesions  Neurological: Negative for dizziness, tremors, focal weakness and weakness.  Endo/Heme/Allergies: Negative for polydipsia. Does not bruise/bleed easily.  Psychiatric/Behavioral: Negative for depression. The patient is not nervous/anxious and does not have insomnia.      VITAL SIGNS:   Vitals:   01/15/17 2130 01/15/17 2145 01/15/17 2200 01/15/17 2300  BP: 130/63  130/69 135/69  Pulse: 70 66 64 66  Resp: 14 14 (!) 9 (!) 9  Temp:      TempSrc:      SpO2: 96% 95% 95% 95%  Weight:      Height:       Wt Readings from Last 3 Encounters:  01/15/17 118.8 kg (262 lb)  01/15/17 118.8 kg (262 lb)  01/11/17 118.8 kg (262 lb)    PHYSICAL EXAMINATION:  Physical Exam  Vitals reviewed. Constitutional: He is oriented to person, place, and time. He appears well-developed and well-nourished. No distress.  HENT:  Head: Normocephalic and atraumatic.  Mouth/Throat: Oropharynx is clear and moist.  Eyes: Pupils are equal, round, and reactive to light. Conjunctivae and EOM are normal. No scleral icterus.  Neck: Normal range of motion. Neck supple. No JVD present. No thyromegaly present.  Cardiovascular: Normal rate, regular rhythm and intact distal pulses.  Exam reveals no gallop and no friction rub.   No murmur heard. Respiratory: Effort normal and breath sounds normal. No respiratory distress. He has no wheezes. He has no rales.  GI: Soft. Bowel sounds are normal. He exhibits  no distension. There is no tenderness.  Musculoskeletal: Normal range of motion. He exhibits no edema.  No arthritis, no gout  Lymphadenopathy:    He has no cervical adenopathy.  Neurological: He is alert and oriented to person, place, and time. No cranial nerve deficit.  No dysarthria, no aphasia  Skin: Skin is warm and dry. No rash noted. No erythema.  Psychiatric: He has a normal mood and affect. His behavior is normal. Judgment and thought content normal.    LABORATORY PANEL:   CBC  Recent Labs Lab 01/15/17 1959  WBC 23.1*  HGB 13.8  HCT 40.5  PLT 275   ------------------------------------------------------------------------------------------------------------------  Chemistries   Recent Labs Lab 01/11/17 1807  01/15/17 1959  NA  --   < > 133*  K  --   < > 3.2*  CL  --   < > 95*  CO2  --   < > 25  GLUCOSE  --   < > 106*  BUN  --   < > 63*  CREATININE  --   < > 3.07*  CALCIUM  --   < > 8.7*  MG 1.8  --   --   AST  --   --  80*  ALT  --   --  68*  ALKPHOS  --   --  80  BILITOT  --   --  1.3*  < > = values in this interval not displayed. ------------------------------------------------------------------------------------------------------------------  Cardiac Enzymes  Recent Labs Lab 01/15/17 1959  TROPONINI 16.35*   ------------------------------------------------------------------------------------------------------------------  RADIOLOGY:  Dg Chest Port 1 View  Result Date: 01/15/2017 CLINICAL DATA:  Sepsis. Patient underwent catheterization for non ST MI on the twelfth. Dysuria and weakness for the past 3 days. EXAM: PORTABLE CHEST 1 VIEW COMPARISON:  None. FINDINGS: Heart size is top normal in size and stable with aortic atherosclerosis. No aortic aneurysm. No pneumonic consolidation. Chronic elevation and/or eventration of the right hemidiaphragm with bibasilar atelectasis and/or scarring.  No overt pulmonary edema. No effusions. 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 IMPRESSION: 1. Borderline cardiomegaly with aortic atherosclerosis. 2. Bibasilar atelectasis and/or scarring. No acute pulmonary disease. Electronically Signed   By: Tollie Eth M.D.   On: 01/15/2017 20:35    EKG:   Orders placed or performed during the hospital encounter of 01/15/17  . EKG 12-Lead  . EKG 12-Lead    IMPRESSION AND PLAN:  Principal Problem:   UTI (urinary tract infection) - IV antibiotics, urine culture sent Active Problems:   Acute on chronic renal failure (HCC) - continue home meds   Type 2 diabetes mellitus with renal complication (HCC) - sliding scale insulin with corresponding glucose checks   Essential hypertension, benign - continue home meds   Chronic diastolic CHF (congestive heart failure) (HCC) - continue home medications   Elevated troponin - patient was recently admitted here with non-STEMI. His troponin checked at that time was 10. He then underwent procedure and was placed on medical management. Troponin here today 16. It is unclear whether this is decreasing troponin from an unchecked peak. We will trend his enzymes to make sure that they are truly coming down.  All the records are reviewed and case discussed with ED provider. Management plans discussed with the patient and/or family.  DVT PROPHYLAXIS: SubQ heparin  GI PROPHYLAXIS: None  ADMISSION STATUS: Inpatient  CODE STATUS: Full Code Status History    Date Active Date Inactive Code Status Order ID Comments User Context   01/11/2017  4:54 PM 01/13/2017  5:36 PM Full Code [id number]  Shaune Pollack, MD Inpatient   01/11/2017  1:41 PM 01/11/2017  4:54 PM Full Code [id number]  Iran Ouch, MD Inpatient   03/20/2012 11:10 PM 03/21/2012  1:50 PM Full Code [id number]  Trixie Dredge, PA ED      TOTAL TIME TAKING CARE OF THIS PATIENT: 45 minutes.   Anne Hahn, Takai Chiaramonte FIELDING 01/15/2017, 11:23 PM  [facility]  Office  [phone]  CC: Primary  care physician; [name], MD  Note:  This document was prepared using Dragon voice recognition software and may include unintentional dictation errors.

## 2017-01-15 NOTE — Telephone Encounter (Signed)
I spoke with Mrs Stein (DPR signed) and Mrs Sarli is taking pt to [facility] now. FYI to Dr [name].

## 2017-01-15 NOTE — Telephone Encounter (Signed)
[facility]  Patient Name: Stephen [name]  DOB: 1945/09/23    Initial Comment Call has a temperature of 100.5 and he is having burning with urination.    Nurse Assessment  Nurse: Tawanna Solo, RN, Vaughan Basta Date/Time (Eastern Time): 01/15/2017 3:53:47 PM  Confirm and document reason for call. If symptomatic, describe symptoms. ---Wife says husband is having burning with urination and 101.5 ax temp. Patient was discharged from the hospital on Saturday. Had catheter when he was in the hospital.  Does the patient have any new or worsening symptoms? ---Yes  Will a triage be completed? ---Yes  Related visit to physician within the last 2 weeks? ---No  Does the PT have any chronic conditions? (i.e. diabetes, asthma, etc.) ---Yes  List chronic conditions. ---DM CHF decreased renal fx COPD uses oxygen 2L  Is this a behavioral health or substance abuse call? ---No     Guidelines    Guideline Title Affirmed Question Affirmed Notes  Urination Pain - Male Fever > 100.5 F (38.1 C)    Final Disposition User   See Physician within 4 Hours (or PCP triage) Tawanna Solo, RN, Vaughan Basta    Comments  Call attempt 1, no answer. Unable to leave a vm because mailbox has not been set up. Nurse will try again in 15 minutes.  Nurse informed caller there are no appointments available and suggested she take the patient to UC. Caller verbalized understanding. Caller does not know which UC she will take patient to.  NOTE Wife is going to take him to UC since no appts available at [facility] today; Triage done by Prescilla Sours, RN Documentation into Epic done by Haynes Kerns, BSN RN.   Referrals  GO TO FACILITY UNDECIDED   Disagree/Comply: Comply

## 2017-01-15 NOTE — ED Provider Notes (Signed)
CSN: [id number]     Arrival date & time 01/15/17  1705 History   First MD Initiated Contact with Patient 01/15/17 1815     Chief Complaint  Patient presents with  . Urinary Tract Infection   (Consider location/radiation/quality/duration/timing/severity/associated sxs/prior Treatment) 71 year old male presents with pain with urination, urinary frequency and urgency for the past 3 days. Also experiencing fever that started this morning as well as bilateral flank pain. He started experiencing this symptoms while he was in the hospital for Cardiac Cath for MI. He was admitted on 01-11-17. He also is Diabetic, has chronic kidney disease and CHF. He was given Tylenol today to help with his fever.    The history is provided by the patient and the spouse.    Past Medical History:  Diagnosis Date  . Adrenal gland anomaly   . Arthritis   . CAD (coronary artery disease)    s/p MI x 3 now with 2 stents  . CHF (congestive heart failure) ([place])   . Colon polyps   . Diabetes mellitus with neuropathy ([place])   . DM2 (diabetes mellitus, type 2) (HCC)    insulin requiring  . Dyslipidemia   . GERD (gastroesophageal reflux disease)   . Heart disease   . High cholesterol   . HTN (hypertension)   . Kidney disease   . Kidney stones   . Metabolic syndrome   . OSA (obstructive sleep apnea)   . 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 Recurrent UTI   . Renal artery stenosis (HCC)    L sided, stent 07/2013  . Rotator cuff disorder    right  . Skin cancer    head  . Thyroid disorder    Past Surgical History:  Procedure Laterality Date  . adrenal adenoma removal  1990-   right  . CARPAL TUNNEL RELEASE     bilateral  . CORONARY ANGIOPLASTY WITH STENT PLACEMENT  2007   x 2  . KNEE SURGERY     right  . LEFT HEART CATH AND CORONARY ANGIOGRAPHY N/A 01/11/2017   Procedure: Left Heart Cath and Coronary Angiography;  Surgeon:  Wellington Hampshire, MD;  Location: [facility] CV LAB;  Service: Cardiovascular;  Laterality: N/A;  . LUNG BIOPSY    . RENAL ARTERY STENT  2015   L  . SKIN CANCER EXCISION     back of head   Family History  Problem Relation Age of Onset  . Heart attack Father   . Heart attack Mother   . Heart attack Brother   . COPD Sister   . Arthritis Maternal Grandmother   . Breast cancer Maternal Aunt   . Diabetes Maternal Aunt   . Hypertension Other   . Hypertension Maternal Aunt    Social History  Substance Use Topics  . Smoking status: Never Smoker  . Smokeless tobacco: Never Used  . Alcohol use No    Review of Systems  Constitutional: Positive for activity change, chills and fever.  Respiratory: Positive for shortness of breath.   Cardiovascular: Positive for leg swelling.  Gastrointestinal: Negative for nausea and vomiting.  Endocrine: Positive for polyuria.  Genitourinary: Positive for dysuria, flank pain, frequency and urgency.  Musculoskeletal: Positive for back pain.  Skin: Negative for rash and wound.  Neurological: Positive for weakness and headaches.    Allergies  Calcium channel blockers  Home Medications   Prior to Admission  medications   Medication Sig Start Date End Date Taking? Authorizing Provider  ACCU-CHEK SOFTCLIX LANCETS lancets Use as instructed to check blood sugar three times daily and as needed.  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 03/09/16  Yes [name], MD  amLODipine (NORVASC) 5 MG tablet Take 1 tablet (5 mg total) by mouth daily. 01/14/17  Yes Demetrios Loll, MD  aspirin 81 MG tablet Take 324 mg by mouth daily.    Yes [provider]  atorvastatin (LIPITOR) 80 MG tablet TAKE 1 TABLET AT BEDTIME 02/03/16  Yes [name], MD  Blood Glucose Monitoring Suppl (ACCU-CHEK AVIVA PLUS) w/Device KIT Use to check blood sugar three times daily and as needed.  Diagnosis: E11.22  Insulin-dependent 03/02/16  Yes [name], MD  carvedilol  (COREG) 25 MG tablet TAKE 1 TABLET TWICE DAILY WITH A MEAL 02/03/16  Yes [name], MD  cloNIDine (CATAPRES) 0.3 MG tablet TAKE 1 TABLET THREE TIMES DAILY 10/27/15  Yes Gollan, Kathlene November, MD  clopidogrel (PLAVIX) 75 MG tablet Take 1 tablet (75 mg total) by mouth daily. 08/07/16  Yes Minna Merritts, MD  gabapentin (NEURONTIN) 800 MG tablet TAKE 1 TABLET IN THE MORNING  AND TAKE 1 AND 1/2 TABLETS AT BEDTIME 10/25/16  Yes [name], MD  glucose blood (ACCU-CHEK AVIVA PLUS) test strip Use as instructed to check blood sugar 3 times daily or as needed.  Diagnosis:  E11.22  Insulin-dependent. 03/02/16  Yes [name], MD  hydrALAZINE (APRESOLINE) 50 MG tablet Take 1 tablet (50 mg total) by mouth 3 (three) times daily. 06/06/16  Yes Gollan, Kathlene November, MD  insulin NPH-regular Human (NOVOLIN 70/30) (70-30) 100 UNIT/ML injection INJECT  70 UNITS SUBCUTANEOUSLY EVERY MORNING  AND  35-45 UNITS IN THE EVENING Patient taking differently: INJECT  70 UNITS SUBCUTANEOUSLY EVERY MORNING  AND  50 units at bedtime 09/30/14  Yes [name], MD  Insulin Syringe-Needle U-100 (INSULIN SYRINGE 1CC/30GX5/16") 30G X 5/16" 1 ML MISC Use as directed to take insulin Dx 250.62 07/14/13  Yes Ria Bush, MD  isosorbide mononitrate (IMDUR) 30 MG 24 hr tablet Take 3 tablets (90 mg total) by mouth 2 (two) times daily. 01/13/17  Yes Demetrios Loll, MD  nitroGLYCERIN (NITROSTAT) 0.4 MG SL tablet Place 1 tablet (0.4 mg total) under the tongue every 5 (five) minutes as needed. May repeat x3 12/16/13  Yes [name], MD  NON FORMULARY Oxygen 2 liters 24/7   Yes [provider]  omeprazole (PRILOSEC) 20 MG capsule Take 20 mg by mouth daily.     Yes [provider]  oxyCODONE (ROXICODONE) 15 MG immediate release tablet Take 1 tablet (15 mg total) by mouth every evening. 12/07/16  Yes [name], MD  torsemide (DEMADEX) 20 MG tablet Take 2 tablets (40 mg total) by mouth daily. 01/05/17  Yes Minna Merritts, MD  triamterene-hydrochlorothiazide (MAXZIDE-25) 37.5-25 MG tablet Take 1 tablet by mouth daily. 11/04/15  Yes Minna Merritts, MD   Meds Ordered and Administered this Visit   Medications  acetaminophen (TYLENOL) tablet 1,000 mg (1,000 mg Oral Given 01/15/17 1805)    BP (!) 114/48 (BP Location: Left Arm)   Pulse 76   Temp 99.4 F (37.4 C) (Oral)   Resp 16   Wt 262 lb (118.8 kg)   SpO2 97%   BMI 43.60 kg/m  No data found.   Physical Exam  Constitutional: He is oriented to person, place, and time.  He appears well-developed and well-nourished. No distress. Nasal cannula in place.  He is resting in a wheelchair but has gotten up to urinate at least 7 times since he has been at the Urgent [place].   HENT:  Head: Normocephalic and atraumatic.  Right Ear: External ear normal.  Left Ear: External ear normal.  Eyes: Conjunctivae and EOM are normal.  Neck: Normal range of motion.  Cardiovascular: Normal rate.   Pulmonary/Chest: Effort normal.  Abdominal: There is CVA tenderness.  Neurological: He is alert and oriented to person, place, and time.  Skin: Skin is warm and dry.  Psychiatric: He has a normal mood and affect.    Urgent Care Course     Procedures (including critical care time)  Labs Review Labs Reviewed  URINALYSIS, COMPLETE (UACMP) WITH MICROSCOPIC - Abnormal; Notable for the following:       Result Value   APPearance HAZY (*)    Hgb urine dipstick TRACE (*)    Protein, ur 100 (*)    Nitrite POSITIVE (*)    Leukocytes, UA SMALL (*)    Bacteria, UA MANY (*)    All other components within normal limits  URINE CULTURE    Imaging Review No results found.   Visual Acuity Review  Right Eye Distance:   Left Eye Distance:   Bilateral Distance:    Right Eye Near:   Left Eye Near:    Bilateral Near:         MDM   1. Dysuria   2. Flank pain, acute   3. Fever, unspecified    Discussed case with Dr. Alveta Heimlich. Concerned that he is febrile,  has chronic kidney disease, and bilateral flank pain. He is also hypotensive and concerned about possibility of pyelonephritis and/or sepsis. Recommend patient be evaluated with additional testing to rule out sepsis (blood cultures). Discussed with patient and caregiver that we recommend he be evaluated at [facility] ER. Patient and caregiver agree with plan and will go the ER now. Notified Mallie Mussel, [facility] charge nurse, regarding patient. Recommend go to ER as planned.      Katy Apo, NP 01/15/17 1904

## 2017-01-15 NOTE — ED Triage Notes (Signed)
Patient c/o burning with urination x 3 days ago and low grade fever.

## 2017-01-15 NOTE — Telephone Encounter (Signed)
Called to complete TCM and confirm hospital f/u appt, VM not available.

## 2017-01-16 DIAGNOSIS — N183 Chronic kidney disease, stage 3 (moderate): Secondary | ICD-10-CM

## 2017-01-16 DIAGNOSIS — N3000 Acute cystitis without hematuria: Secondary | ICD-10-CM

## 2017-01-16 DIAGNOSIS — R748 Abnormal levels of other serum enzymes: Secondary | ICD-10-CM

## 2017-01-16 DIAGNOSIS — R7881 Bacteremia: Secondary | ICD-10-CM

## 2017-01-16 DIAGNOSIS — N179 Acute kidney failure, unspecified: Secondary | ICD-10-CM

## 2017-01-16 LAB — BLOOD CULTURE ID PANEL (REFLEXED)

## 2017-01-16 LAB — BASIC METABOLIC PANEL
Anion gap: 11 (ref 5–15)
BUN: 56 mg/dL — ABNORMAL HIGH (ref 6–20)
CO2: 25 mmol/L (ref 22–32)
Calcium: 8.2 mg/dL — ABNORMAL LOW (ref 8.9–10.3)
Chloride: 100 mmol/L — ABNORMAL LOW (ref 101–111)
Creatinine, Ser: 2.66 mg/dL — ABNORMAL HIGH (ref 0.61–1.24)
GFR calc Af Amer: 27 mL/min — ABNORMAL LOW (ref >60)
GFR calc non Af Amer: 23 mL/min — ABNORMAL LOW (ref >60)
Glucose, Bld: 196 mg/dL — ABNORMAL HIGH (ref 65–99)
Potassium: 3.4 mmol/L — ABNORMAL LOW (ref 3.5–5.1)
Sodium: 136 mmol/L (ref 135–145)

## 2017-01-16 LAB — CBC
HCT: 39.6 % — ABNORMAL LOW (ref 40.0–52.0)
Hemoglobin: 13.1 g/dL (ref 13.0–18.0)
MCH: 28.1 pg (ref 26.0–34.0)
MCHC: 33.2 g/dL (ref 32.0–36.0)
MCV: 84.6 fL (ref 80.0–100.0)
Platelets: 249 10*3/uL (ref 150–440)
RBC: 4.67 MIL/uL (ref 4.40–5.90)
RDW: 14.8 % — ABNORMAL HIGH (ref 11.5–14.5)
WBC: 20.4 10*3/uL — ABNORMAL HIGH (ref 3.8–10.6)

## 2017-01-16 LAB — MAGNESIUM: Magnesium: 1.9 mg/dL (ref 1.7–2.4)

## 2017-01-16 LAB — TROPONIN I
Troponin I: 8.01 ng/mL (ref <0.03)
Troponin I: 5.5 ng/mL (ref <0.03)
Troponin I: 4.91 ng/mL (ref <0.03)

## 2017-01-16 LAB — GLUCOSE, CAPILLARY
Glucose-Capillary: 198 mg/dL — ABNORMAL HIGH (ref 65–99)
Glucose-Capillary: 242 mg/dL — ABNORMAL HIGH (ref 65–99)
Glucose-Capillary: 389 mg/dL — ABNORMAL HIGH (ref 65–99)
Glucose-Capillary: 427 mg/dL — ABNORMAL HIGH (ref 65–99)
Glucose-Capillary: 386 mg/dL — ABNORMAL HIGH (ref 65–99)
Glucose-Capillary: 400 mg/dL — ABNORMAL HIGH (ref 65–99)

## 2017-01-16 MED ORDER — POTASSIUM CHLORIDE CRYS ER 20 MEQ PO TBCR
40.0000 meq | EXTENDED_RELEASE_TABLET | Freq: Two times a day (BID) | ORAL | Status: AC
  Administered 2017-01-16 (×2): 40 meq via ORAL
  Filled 2017-01-16 (×2): qty 2

## 2017-01-16 MED ORDER — ISOSORBIDE MONONITRATE ER 60 MG PO TB24
90.0000 mg | ORAL_TABLET | Freq: Two times a day (BID) | ORAL | Status: DC
  Administered 2017-01-16 – 2017-01-18 (×5): 90 mg via ORAL
  Filled 2017-01-16 (×5): qty 2

## 2017-01-16 MED ORDER — HYDRALAZINE HCL 50 MG PO TABS
50.0000 mg | ORAL_TABLET | Freq: Three times a day (TID) | ORAL | Status: DC
  Administered 2017-01-16: 50 mg via ORAL
  Filled 2017-01-16 (×2): qty 1

## 2017-01-16 MED ORDER — DEXTROSE 5 % IV SOLN
2.0000 g | INTRAVENOUS | Status: DC
  Administered 2017-01-16 – 2017-01-17 (×2): 2 g via INTRAVENOUS
  Filled 2017-01-16 (×3): qty 2

## 2017-01-16 MED ORDER — ONDANSETRON HCL 4 MG/2ML IJ SOLN
4.0000 mg | Freq: Four times a day (QID) | INTRAMUSCULAR | Status: DC | PRN
  Administered 2017-01-17: 4 mg via INTRAVENOUS
  Filled 2017-01-16: qty 2

## 2017-01-16 MED ORDER — OXYCODONE HCL 5 MG PO TABS
5.0000 mg | ORAL_TABLET | ORAL | Status: DC | PRN
  Administered 2017-01-16 – 2017-01-18 (×8): 5 mg via ORAL
  Filled 2017-01-16 (×8): qty 1

## 2017-01-16 MED ORDER — INSULIN ASPART 100 UNIT/ML ~~LOC~~ SOLN
0.0000 [IU] | Freq: Three times a day (TID) | SUBCUTANEOUS | Status: DC
  Administered 2017-01-16: 9 [IU] via SUBCUTANEOUS
  Administered 2017-01-16: 3 [IU] via SUBCUTANEOUS
  Administered 2017-01-16 – 2017-01-17 (×2): 9 [IU] via SUBCUTANEOUS
  Administered 2017-01-17: 3 [IU] via SUBCUTANEOUS
  Administered 2017-01-17 – 2017-01-18 (×3): 5 [IU] via SUBCUTANEOUS
  Filled 2017-01-16 (×8): qty 1

## 2017-01-16 MED ORDER — GABAPENTIN 800 MG PO TABS: 800.0000 mg | ORAL_TABLET | Freq: Two times a day (BID) | ORAL | Status: DC

## 2017-01-16 MED ORDER — INSULIN ASPART 100 UNIT/ML ~~LOC~~ SOLN
0.0000 [IU] | Freq: Every day | SUBCUTANEOUS | Status: DC
  Administered 2017-01-16: 5 [IU] via SUBCUTANEOUS
  Filled 2017-01-16: qty 1

## 2017-01-16 MED ORDER — GABAPENTIN 400 MG PO CAPS
800.0000 mg | ORAL_CAPSULE | Freq: Every morning | ORAL | Status: DC
  Administered 2017-01-16 – 2017-01-18 (×3): 800 mg via ORAL
  Filled 2017-01-16 (×3): qty 2

## 2017-01-16 MED ORDER — ACETAMINOPHEN 325 MG PO TABS: 650.0000 mg | ORAL_TABLET | Freq: Four times a day (QID) | ORAL | Status: DC | PRN

## 2017-01-16 MED ORDER — PANTOPRAZOLE SODIUM 40 MG PO TBEC
40.0000 mg | DELAYED_RELEASE_TABLET | Freq: Every day | ORAL | Status: DC
  Administered 2017-01-16 – 2017-01-18 (×3): 40 mg via ORAL
  Filled 2017-01-16 (×3): qty 1

## 2017-01-16 MED ORDER — ONDANSETRON HCL 4 MG PO TABS: 4.0000 mg | ORAL_TABLET | Freq: Four times a day (QID) | ORAL | Status: DC | PRN

## 2017-01-16 MED ORDER — SODIUM CHLORIDE 0.9 % IV SOLN
INTRAVENOUS | Status: AC
  Administered 2017-01-16 – 2017-01-17 (×2): via INTRAVENOUS

## 2017-01-16 MED ORDER — INSULIN DETEMIR 100 UNIT/ML ~~LOC~~ SOLN
25.0000 [IU] | Freq: Two times a day (BID) | SUBCUTANEOUS | Status: DC
  Administered 2017-01-16 – 2017-01-17 (×3): 25 [IU] via SUBCUTANEOUS
  Filled 2017-01-16 (×4): qty 0.25

## 2017-01-16 MED ORDER — AMLODIPINE BESYLATE 5 MG PO TABS
5.0000 mg | ORAL_TABLET | Freq: Every day | ORAL | Status: DC
  Administered 2017-01-16: 5 mg via ORAL
  Filled 2017-01-16: qty 1

## 2017-01-16 MED ORDER — CLONIDINE HCL 0.1 MG PO TABS
0.3000 mg | ORAL_TABLET | Freq: Three times a day (TID) | ORAL | Status: DC
  Administered 2017-01-16: 0.3 mg via ORAL
  Filled 2017-01-16 (×3): qty 3

## 2017-01-16 MED ORDER — INSULIN ASPART 100 UNIT/ML ~~LOC~~ SOLN
5.0000 [IU] | Freq: Three times a day (TID) | SUBCUTANEOUS | Status: DC
  Administered 2017-01-16 – 2017-01-17 (×2): 5 [IU] via SUBCUTANEOUS
  Filled 2017-01-16 (×2): qty 1

## 2017-01-16 MED ORDER — ACETAMINOPHEN 650 MG RE SUPP: 650.0000 mg | Freq: Four times a day (QID) | RECTAL | Status: DC | PRN

## 2017-01-16 MED ORDER — CARVEDILOL 25 MG PO TABS
25.0000 mg | ORAL_TABLET | Freq: Two times a day (BID) | ORAL | Status: DC
  Administered 2017-01-16 – 2017-01-18 (×5): 25 mg via ORAL
  Filled 2017-01-16 (×5): qty 1

## 2017-01-16 MED ORDER — ASPIRIN EC 81 MG PO TBEC
324.0000 mg | DELAYED_RELEASE_TABLET | Freq: Every day | ORAL | Status: DC
  Administered 2017-01-16 – 2017-01-18 (×3): 324 mg via ORAL
  Filled 2017-01-16 (×3): qty 4

## 2017-01-16 MED ORDER — HEPARIN SODIUM (PORCINE) 5000 UNIT/ML IJ SOLN
5000.0000 [IU] | Freq: Three times a day (TID) | INTRAMUSCULAR | Status: DC
  Administered 2017-01-16 – 2017-01-18 (×7): 5000 [IU] via SUBCUTANEOUS
  Filled 2017-01-16 (×7): qty 1

## 2017-01-16 MED ORDER — ATORVASTATIN CALCIUM 20 MG PO TABS
80.0000 mg | ORAL_TABLET | Freq: Every day | ORAL | Status: DC
  Administered 2017-01-16 – 2017-01-17 (×3): 80 mg via ORAL
  Filled 2017-01-16 (×3): qty 4

## 2017-01-16 MED ORDER — GABAPENTIN 800 MG PO TABS
800.0000 mg | ORAL_TABLET | Freq: Every morning | ORAL | Status: DC
  Filled 2017-01-16: qty 1

## 2017-01-16 MED ORDER — CLOPIDOGREL BISULFATE 75 MG PO TABS
75.0000 mg | ORAL_TABLET | Freq: Every day | ORAL | Status: DC
  Administered 2017-01-16 – 2017-01-18 (×3): 75 mg via ORAL
  Filled 2017-01-16 (×3): qty 1

## 2017-01-16 MED ORDER — TORSEMIDE 20 MG PO TABS: 40.0000 mg | ORAL_TABLET | Freq: Every day | ORAL | Status: DC

## 2017-01-16 NOTE — ED Notes (Signed)
Pt transported to room 257 

## 2017-01-16 NOTE — Progress Notes (Signed)
PHARMACY - PHYSICIAN COMMUNICATION CRITICAL VALUE ALERT - BLOOD CULTURE IDENTIFICATION (BCID)  Results for orders placed or performed during the hospital encounter of 01/15/17  Blood Culture ID Panel (Reflexed) (Collected: 01/15/2017  7:59 PM)  Result Value Ref Range   Enterococcus species NOT DETECTED NOT DETECTED   Listeria monocytogenes NOT DETECTED NOT DETECTED   Staphylococcus species NOT DETECTED NOT DETECTED   Staphylococcus aureus NOT DETECTED NOT DETECTED   Streptococcus species NOT DETECTED NOT DETECTED   Streptococcus agalactiae NOT DETECTED NOT DETECTED   Streptococcus pneumoniae NOT DETECTED NOT DETECTED   Streptococcus pyogenes NOT DETECTED NOT DETECTED   Acinetobacter baumannii NOT DETECTED NOT DETECTED   Enterobacteriaceae species DETECTED (A) NOT DETECTED   Enterobacter cloacae complex NOT DETECTED NOT DETECTED   Escherichia coli DETECTED (A) NOT DETECTED   Klebsiella oxytoca NOT DETECTED NOT DETECTED   Klebsiella pneumoniae NOT DETECTED NOT DETECTED   Proteus species NOT DETECTED NOT DETECTED   Serratia marcescens NOT DETECTED NOT DETECTED   Carbapenem resistance NOT DETECTED NOT DETECTED   Haemophilus influenzae NOT DETECTED NOT DETECTED   Neisseria meningitidis NOT DETECTED NOT DETECTED   Pseudomonas aeruginosa NOT DETECTED NOT DETECTED   Candida albicans NOT DETECTED NOT DETECTED   Candida glabrata NOT DETECTED NOT DETECTED   Candida krusei NOT DETECTED NOT DETECTED   Candida parapsilosis NOT DETECTED NOT DETECTED   Candida tropicalis NOT DETECTED NOT DETECTED    Name of physician (or Provider) Contacted: Fritzi Mandes, MD   Changes to prescribed antibiotics required: No changes   Birgit Nowling D 01/16/2017  2:36 PM

## 2017-01-16 NOTE — Progress Notes (Signed)
Pharmacy dose adjustment: Patient on gabapentin 800 mg in the morning and 1200 mg in the evening.  Patient appears to be in AKI on CKD w/ CrCl 27.5 ml/min. Will dose adjust to gabapentin 800 mg daily. (max dose is 700 mg daily for CrCl > 15 to 39 ml/min).  Will revert back to patient's home dose once CrCl >30 ml/min.  Tobie Lords, PharmD, BCPS Clinical Pharmacist 01/16/2017

## 2017-01-16 NOTE — Consult Note (Signed)
Cardiology Consultation Note  Patient ID: Stephen [name], MRN: [id number], DOB/AGE: [birth year] 69 y.o. Admit date: 01/15/2017   Date of Consult: 01/16/2017 Primary Physician: [name], MD Primary Cardiologist: Dr. Rockey Situ, MD Requesting Physician: Dr. Jannifer Franklin, MD  Chief Complaint: Dysuria Reason for Consult: Elevated troponin  HPI: Stephen [name] is a 71 y.o. male who is being seen today for the evaluation of elevated troponin at the request of Dr. Jannifer Franklin, MD. Patient has a h/o CAD s/p prior MI and PCI/stenting to the mid LCx and mid RCA in 04/2006, interstitial fibrosis s/p wedge resection, on chronic oxygen, CKD stage III, HTN, HLD, DM2, and remote tobacco abuse who was just admitted to [facility] the week prior for a NSTEMI with medical management being advised after LHC returned to [facility] with dysuria.   Prior ischemic evaluation: Dobutamine stress test July 2011 that showed no significant ischemia, inadequate heart rate was achieved. Prior right and left heart catheterization January 2011 showed 50% mid LAD disease, 60% at the ostium of the diagonal #2, 30% proximal left circumflex disease, patent stent in the left circumflex, patent stent of the mid RCA, wedge pressure of 16, PA pressure mean 22. Prior echo in 03/2009 showed normal systolic function, DD, normal RV function. Admitted 01/11/17 at [facility] with NSTEMI. Troponin was up trending at that time with last value being 10 that admission. He underwent LHC that showed significant 2-vessel CAD with an occluded stent in the mid LCx with collaterals noted from the LAD and right coronary artery. Patent RCA stent with 70% stenosis distal to the stent in the midsegment and 50% stenosis in the distal segment, as well as moderate LAD disease. Details below. Medical management was advised with consideration of FFR based PCI of the RCA for recurrent symptoms. Echo showed EF ~ 50% with technically difficult study noted. He was diuresed and amlodipine was  added for better BP control. He was discharged on 7/14.   He returned to [facility] on 7/16 with dysuria. Troponin was checked and found to be 16.35 that has isnce down trended to 8.01. It was unclear if this was a continued downward trend from prior peak that was not checked vs a new upward trend. Never with any chest pain. EKG not acute. CXR with borderline cardiomegaly and aortic atherosclerosis. Renal function was noted to be elevated at 3.07 at ED presentation with a baseline of ~ 1.8. Potassium low at 3.2-->3.4. WBC 23.1-->20.4. Blood cutlures no growth to date x 2. PCT 0.86. T max 100.5. He was started on Rocephin. Vitals stable.    Past Medical History:  Diagnosis Date  . Adrenal gland anomaly   . Arthritis   . CAD (coronary artery disease)    s/p MI x 3 now with 2 stents  . CHF (congestive heart failure) ([place])   . Colon polyps   . Diabetes mellitus with neuropathy ([place])   . DM2 (diabetes mellitus, type 2) (HCC)    insulin requiring  . Dyslipidemia   . GERD (gastroesophageal reflux disease)   . Heart disease   . High cholesterol   . HTN (hypertension)   . Kidney disease   . Kidney stones   . Metabolic syndrome   . OSA (obstructive sleep apnea)   . 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 Recurrent UTI   . Renal artery stenosis (HCC)    L sided, stent 07/2013  . Rotator cuff  disorder    right  . Skin cancer    head  . Thyroid disorder       Most Recent Cardiac Studies: LHC 01/11/2017: Coronary Findings   Dominance: Right  Left Main  Vessel is angiographically normal.  LM lesion, 30% stenosed.  Left Anterior Descending  Prox LAD lesion, 30% stenosed.  Mid LAD lesion, 50% stenosed.  Dist LAD lesion, 30% stenosed.  First Diagonal Branch  There is mild disease in the vessel.  Second Diagonal Branch  The vessel exhibits minimal luminal irregularities.  Third Diagonal Branch  Vessel is small in  size. The vessel exhibits minimal luminal irregularities.  Left Circumflex  Mid Cx lesion, 100% stenosed. The lesion was previously treatedover 2 years ago.  First Obtuse Marginal Branch  Vessel is small in size. There is mild disease in the vessel.  Right Coronary Artery  Prox RCA lesion, 20% stenosed.  Mid RCA-1 lesion, 10% stenosed. The lesion was previously treatedover 2 years ago.  Mid RCA-2 lesion, 70% stenosed.  Dist RCA lesion, 50% stenosed.  Right Posterior Descending Artery  The vessel exhibits minimal luminal irregularities.  Right Posterior Atrioventricular Branch  Post Atrio lesion, 40% stenosed.  Coronary Diagrams   Diagnostic Diagram        TTE 01/11/2017: Study Conclusions  - Procedure narrative: The only view obtainable was parasternal.   Transthoracic echocardiography. The study was technically   difficult, as a result of restricted patient mobility. - Left ventricle: The cavity size was normal. There was moderate   concentric hypertrophy. Systolic function was normal. The   estimated ejection fraction was in the range of 50% to 55%.   Images were inadequate for LV wall motion assessment. The study   is not technically sufficient to allow evaluation of LV diastolic   function. - Mitral valve: Calcified annulus.   Surgical History:  Past Surgical History:  Procedure Laterality Date  . adrenal adenoma removal  1990-   right  . CARPAL TUNNEL RELEASE     bilateral  . CORONARY ANGIOPLASTY WITH STENT PLACEMENT  2007   x 2  . KNEE SURGERY     right  . LEFT HEART CATH AND CORONARY ANGIOGRAPHY N/A 01/11/2017   Procedure: Left Heart Cath and Coronary Angiography;  Surgeon: Wellington Hampshire, MD;  Location: [facility];  Service: Cardiovascular;  Laterality: N/A;  . LUNG BIOPSY    . RENAL ARTERY STENT  2015   L  . SKIN CANCER EXCISION     back of head     Home Meds: Prior to Admission medications   Medication Sig Start Date End Date Taking?  Authorizing Provider  amLODipine (NORVASC) 5 MG tablet Take 1 tablet (5 mg total) by mouth daily. 01/14/17  Yes Demetrios Loll, MD  aspirin 81 MG tablet Take 324 mg by mouth daily.    Yes [provider]  atorvastatin (LIPITOR) 80 MG tablet TAKE 1 TABLET AT BEDTIME 02/03/16  Yes [name], MD  carvedilol (COREG) 25 MG tablet TAKE 1 TABLET TWICE DAILY WITH A MEAL 02/03/16  Yes [name], MD  cloNIDine (CATAPRES) 0.3 MG tablet TAKE 1 TABLET THREE TIMES DAILY 10/27/15  Yes Gollan, Kathlene November, MD  clopidogrel (PLAVIX) 75 MG tablet Take 1 tablet (75 mg total) by mouth daily. 08/07/16  Yes Minna Merritts, MD  gabapentin (NEURONTIN) 800 MG tablet TAKE 1 TABLET IN THE MORNING  AND TAKE 1 AND 1/2 TABLETS AT BEDTIME 10/25/16  Yes [name],  Elveria Rising, MD  hydrALAZINE (APRESOLINE) 50 MG tablet Take 1 tablet (50 mg total) by mouth 3 (three) times daily. 06/06/16  Yes Gollan, Kathlene November, MD  insulin NPH-regular Human (NOVOLIN 70/30) (70-30) 100 UNIT/ML injection INJECT  70 UNITS SUBCUTANEOUSLY EVERY MORNING  AND  35-45 UNITS IN THE EVENING Patient taking differently: INJECT  70 UNITS SUBCUTANEOUSLY EVERY MORNING  AND  50 units at bedtime 09/30/14  Yes [name], MD  isosorbide mononitrate (IMDUR) 30 MG 24 hr tablet Take 3 tablets (90 mg total) by mouth 2 (two) times daily. 01/13/17  Yes Demetrios Loll, MD  nitroGLYCERIN (NITROSTAT) 0.4 MG SL tablet Place 1 tablet (0.4 mg total) under the tongue every 5 (five) minutes as needed. May repeat x3 12/16/13  Yes [name], MD  omeprazole (PRILOSEC) 20 MG capsule Take 20 mg by mouth daily.     Yes [provider]  oxyCODONE (ROXICODONE) 15 MG immediate release tablet Take 1 tablet (15 mg total) by mouth every evening. 12/07/16  Yes [name], MD  torsemide (DEMADEX) 20 MG tablet Take 2 tablets (40 mg total) by mouth daily. 01/05/17  Yes Minna Merritts, MD  triamterene-hydrochlorothiazide (MAXZIDE-25) 37.5-25 MG tablet Take 1 tablet by mouth  daily. 11/04/15  Yes Gollan, Kathlene November, MD  ACCU-CHEK SOFTCLIX LANCETS lancets Use as instructed to check blood sugar three times daily and as needed.  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 03/09/16   [name], MD  Blood Glucose Monitoring Suppl (ACCU-CHEK AVIVA PLUS) w/Device KIT Use to check blood sugar three times daily and as needed.  Diagnosis: E11.22  Insulin-dependent 03/02/16   [name], MD  glucose blood (ACCU-CHEK AVIVA PLUS) test strip Use as instructed to check blood sugar 3 times daily or as needed.  Diagnosis:  E11.22  Insulin-dependent. 03/02/16   [name], MD  Insulin Syringe-Needle U-100 (INSULIN SYRINGE 1CC/30GX5/16") 30G X 5/16" 1 ML MISC Use as directed to take insulin Dx 250.62 07/14/13   Ria Bush, MD  NON FORMULARY Oxygen 2 liters 24/7    [provider]    Inpatient Medications:  . amLODipine  5 mg Oral Daily  . aspirin EC  324 mg Oral Daily  . atorvastatin  80 mg Oral QHS  . carvedilol  25 mg Oral BID WC  . cloNIDine  0.3 mg Oral TID  . clopidogrel  75 mg Oral Daily  . gabapentin  800 mg Oral q morning - 10a  . heparin  5,000 Units Subcutaneous Q8H  . hydrALAZINE  50 mg Oral TID  . insulin aspart  0-5 Units Subcutaneous QHS  . insulin aspart  0-9 Units Subcutaneous TID WC  . isosorbide mononitrate  90 mg Oral BID  . pantoprazole  40 mg Oral Daily  . torsemide  40 mg Oral Daily   . cefTRIAXone (ROCEPHIN)  IV      Allergies:  Allergies  Allergen Reactions  . Calcium Channel Blockers     Would avoid if possible due to h/o peripheral edema    Social History   Social History  . Marital status: Married    Spouse name: N/A  . Number of children: 2  . Years of education: N/A   Occupational History  . 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  Disabled   Social History Main Topics  . Smoking status: Never Smoker  . Smokeless tobacco:  Never Used  . Alcohol use No  . Drug use: No  . Sexual activity: Not on file   Other Topics Concern  . Not on file   Social History Narrative   Enjoys fishing   Y[employer] fan     Family History  Problem Relation Age of Onset  . Heart attack Father   . Heart attack Mother   . Heart attack Brother   . COPD Sister   . Arthritis Maternal Grandmother   . Breast cancer Maternal Aunt   . Diabetes Maternal Aunt   . Hypertension Other   . Hypertension Maternal Aunt      Review of Systems: Review of Systems  Constitutional: Positive for malaise/fatigue. Negative for chills, diaphoresis, fever and weight loss.  HENT: Negative for congestion.   Eyes: Negative for discharge and redness.  Respiratory: Negative for cough, hemoptysis, sputum production, shortness of breath and wheezing.   Cardiovascular: Negative for chest pain, palpitations, orthopnea, claudication, leg swelling and PND.  Gastrointestinal: Negative for abdominal pain, blood in stool, heartburn, melena, nausea and vomiting.  Genitourinary: Positive for dysuria, frequency and urgency. Negative for hematuria.  Musculoskeletal: Negative for falls and myalgias.  Skin: Negative for rash.  Neurological: Negative for dizziness, tingling, tremors, sensory change, speech change, focal weakness, loss of consciousness and weakness.  Endo/Heme/Allergies: Does not bruise/bleed easily.  Psychiatric/Behavioral: Negative for substance abuse. The patient is not nervous/anxious.   All other systems reviewed and are negative.   Labs:  Recent Labs  01/15/17 1959 01/16/17 0150  TROPONINI 16.35* 8.01*   Lab Results  Component Value Date   WBC 20.4 (H) 01/16/2017   HGB 13.1 01/16/2017   HCT 39.6 (L) 01/16/2017   MCV 84.6 01/16/2017   PLT 249 01/16/2017    Recent Labs Lab 01/15/17 1959 01/16/17 0150  NA 133* 136  K 3.2* 3.4*  CL 95* 100*  CO2 25 25  BUN 63* 56*  CREATININE 3.07* 2.66*  CALCIUM 8.7* 8.2*  PROT 7.6  --     BILITOT 1.3*  --   ALKPHOS 80  --   ALT 68*  --   AST 80*  --   GLUCOSE 106* 196*   Lab Results  Component Value Date   CHOL 141 01/12/2017   HDL 22 (L) 01/12/2017   LDLCALC 87 01/12/2017   TRIG 160 (H) 01/12/2017   No results found for: DDIMER  Radiology/Studies:    Dg Chest Port 1 View  Result Date: 01/15/2017 IMPRESSION: 1. Borderline cardiomegaly with aortic atherosclerosis. 2. Bibasilar atelectasis and/or scarring. No acute pulmonary disease. Electronically Signed   By: Ashley Royalty M.D.   On: 01/15/2017 20:35    EKG: Interpreted by me showed: NSR, 71 bpm, IVCD, poor R wave progression, prior inferior infarct, no acute st/t changes Telemetry: Interpreted by me showed: NSR  Weights: Filed Weights   01/15/17 1947 01/16/17 0500  Weight: 262 lb (118.8 kg) 257 lb 15 oz (117 kg)     Physical Exam: Blood pressure (!) 127/44, pulse 74, temperature 99.5 F (37.5 C), temperature source Oral, resp. rate (!) 22, height '5\' 5"'$  (1.651 m), weight 257 lb 15 oz (117 kg), SpO2 93 %. Body mass index is 42.92 kg/m. General: Well developed, well nourished, in no acute distress. Head: Normocephalic, atraumatic, sclera non-icteric, no xanthomas, nares are without discharge.  Neck: Negative for carotid bruits. JVD not elevated. Lungs: Clear bilaterally to auscultation without wheezes, rales, or rhonchi. Breathing is unlabored. Heart: RRR with S1 S2. No  murmurs, rubs, or gallops appreciated. Abdomen: Soft, non-tender, non-distended with normoactive bowel sounds. No hepatomegaly. No rebound/guarding. No obvious abdominal masses. Msk:  Strength and tone appear normal for age. Extremities: No clubbing or cyanosis. Trace pre-tibial edema bilaterally. Distal pedal pulses are 2+ and equal bilaterally. Neuro: Alert and oriented X 3. No facial asymmetry. No focal deficit. Moves all extremities spontaneously. Psych:  Responds to questions appropriately with a normal affect.    Assessment and  Plan:  Principal Problem:   UTI (urinary tract infection) Active Problems:   Type 2 diabetes mellitus with renal complication (HCC)   Essential hypertension, benign   Chronic diastolic CHF (congestive heart failure) (HCC)   Acute on chronic renal failure (HCC)   Elevated troponin    1. Elevated troponin: -Unclear if this is a downward trend from prior unchecked peak from recent NSTEMI admission vs a new upward trend -Never with any chest pain -Troponin now down trending -Given he has been without chest pain and in the setting of his acute on CKD stage III, he is not a good candidate for any invasive ischemic workup at this time -Recommend treating acute illness below followed by continued medical management of his CAD with the addition of Ranexa if needed  2. Sepsis 2/2 UTI: -ABX per IM   3. CAD: -As above -Continue ASA, Plavix, Coreg, Lipitor -Consider Ranexa for recurrent pain  4. Acute on chronic diastolic CHF: -Received IV fluids in the ED given AKI -He does not appear to be volume overloaded -Resume torsemide  -Not on ACEi/ARB/ANRI 2/2 CKD -Imdur/hydralazine  5. Acute on CKD stage III: -Likely ATN 2/2 infection -Improving -Monitor   Signed, Christell Faith, PA-C [facility] Pager: [phone] 01/16/2017, 8:09 AM

## 2017-01-16 NOTE — Progress Notes (Signed)
Nutrition Brief Note  Patient identified on the Malnutrition Screening Tool (MST) Report   71 y.o. male who presents with dysuria; found to have UTI  Wt Readings from Last 15 Encounters:  01/16/17 257 lb 15 oz (117 kg)  01/15/17 262 lb (118.8 kg)  01/11/17 262 lb (118.8 kg)  01/05/17 270 lb 12 oz (122.8 kg)  12/15/16 262 lb (118.8 kg)  12/11/16 269 lb (122 kg)  12/07/16 272 lb 8 oz (123.6 kg)  11/06/16 270 lb (122.5 kg)  10/23/16 272 lb (123.4 kg)  10/16/16 271 lb (122.9 kg)  10/09/16 273 lb (123.8 kg)  10/06/16 278 lb (126.1 kg)  08/31/16 279 lb 12 oz (126.9 kg)  06/06/16 269 lb 12 oz (122.4 kg)  05/24/16 269 lb 6.4 oz (122.2 kg)    Body mass index is 42.92 kg/m. Patient meets criteria for morbid obesity based on current BMI. Per chart, pt with 13lb(5%) wt loss in two weeks; pt reports this was related to improving edema.   Current diet order is HH/CHO, patient is consuming approximately 100% of meals at this time. Labs and medications reviewed.   No nutrition interventions warranted at this time. If nutrition issues arise, please consult RD.   Koleen Distance MS, RD, LDN Pager #- [phone] After Hours Pager: [phone]

## 2017-01-16 NOTE — Progress Notes (Signed)
[facility] - [place] at [facility]   PATIENT NAME: Stephen [name]    MR#:  [id number]  DATE OF BIRTH:  12/28/1945  SUBJECTIVE:   Came in with increasing weakness and burning in urine REVIEW OF SYSTEMS:   Review of Systems  Constitutional: Positive for diaphoresis. Negative for chills, fever and weight loss.  HENT: Negative for ear discharge, ear pain and nosebleeds.   Eyes: Negative for blurred vision, pain and discharge.  Respiratory: Negative for sputum production, shortness of breath, wheezing and stridor.   Cardiovascular: Negative for chest pain, palpitations, orthopnea and PND.  Gastrointestinal: Negative for abdominal pain, diarrhea, nausea and vomiting.  Genitourinary: Positive for dysuria. Negative for frequency and urgency.  Musculoskeletal: Positive for back pain. Negative for joint pain.  Neurological: Positive for weakness. Negative for sensory change, speech change and focal weakness.  Psychiatric/Behavioral: Negative for depression and hallucinations. The patient is not nervous/anxious.    Tolerating Diet: Tolerating PT:   DRUG ALLERGIES:   Allergies  Allergen Reactions  . Calcium Channel Blockers     Would avoid if possible due to h/o peripheral edema    VITALS:  Blood pressure (!) 112/44, pulse 68, temperature 98.8 F (37.1 C), temperature source Oral, resp. rate 18, height 5\' 5"  (1.651 m), weight 117 kg (257 lb 15 oz), SpO2 94 %.  PHYSICAL EXAMINATION:   Physical Exam  GENERAL:  71 y.o.-year-old patient lying in the bed with no acute distress. obese EYES: Pupils equal, round, reactive to light and accommodation. No scleral icterus. Extraocular muscles intact.  HEENT: Head atraumatic, normocephalic. Oropharynx and nasopharynx clear.  NECK:  Supple, no jugular venous distention. No thyroid enlargement, no tenderness.  LUNGS: Normal breath sounds bilaterally, no wheezing, rales, rhonchi. No use of accessory muscles of respiration.   CARDIOVASCULAR: S1, S2 normal. No murmurs, rubs, or gallops.  ABDOMEN: Soft, nontender, nondistended. Bowel sounds present. No organomegaly or mass.  EXTREMITIES: No cyanosis, clubbing or edema b/l.    NEUROLOGIC: Cranial nerves II through XII are intact. No focal Motor or sensory deficits b/l.   PSYCHIATRIC:  patient is alert and oriented x 3.  SKIN: No obvious rash, lesion, or ulcer.   LABORATORY PANEL:  CBC  Recent Labs Lab 01/16/17 0150  WBC 20.4*  HGB 13.1  HCT 39.6*  PLT 249    Chemistries   Recent Labs Lab 01/15/17 1959 01/16/17 0150 01/16/17 1403  NA 133* 136  --   K 3.2* 3.4*  --   CL 95* 100*  --   CO2 25 25  --   GLUCOSE 106* 196*  --   BUN 63* 56*  --   CREATININE 3.07* 2.66*  --   CALCIUM 8.7* 8.2*  --   MG  --   --  1.9  AST 80*  --   --   ALT 68*  --   --   ALKPHOS 80  --   --   BILITOT 1.3*  --   --    Cardiac Enzymes  Recent Labs Lab 01/16/17 1403  TROPONINI 4.91*   RADIOLOGY:  Dg Chest Port 1 View  Result Date: 01/15/2017 CLINICAL DATA:  Sepsis. Patient underwent catheterization for non ST MI on the twelfth. Dysuria and weakness for the past 3 days. EXAM: PORTABLE CHEST 1 VIEW COMPARISON:  None. FINDINGS: Heart size is top normal in size and stable with aortic atherosclerosis. No aortic aneurysm. No pneumonic consolidation. Chronic elevation and/or eventration of the right hemidiaphragm with bibasilar  atelectasis and/or scarring. No overt pulmonary edema. No effusions. 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 IMPRESSION: 1. Borderline cardiomegaly with aortic atherosclerosis. 2. Bibasilar atelectasis and/or scarring. No acute pulmonary disease. Electronically Signed   By: Tollie Eth M.D.   On: 01/15/2017 20:35   ASSESSMENT AND PLAN:  Chambers Tucker  is a 71 y.o. male who presents with Dysuria. Patient states that for the past couple of days he's had significant burning with urination. He came to the ED today and was  found to have an elevated white count and a UTI. He states he had a fever at home  * GNR sepsis source UTI (urinary tract infection)  - IV  Rocephin urine culture sent  *  Acute on chronic renal failure (HCC) - continue home meds  *Type 2 diabetes mellitus with renal complication (HCC) - sliding scale insulin with corresponding glucose checks  * Essential hypertension, benign - continue home meds  *Chronic diastolic CHF (congestive heart failure) (HCC) - continue home medications  * Elevated troponin - patient was recently admitted here with non-STEMI. His troponin checked at that time was 10. He then underwent procedure and was placed on medical management. Troponin here today 16.We will trend his enzymes to make sure that they are truly coming down. -seen by cardiology continue medical management  Case discussed with Care Management/Social Worker. Management plans discussed with the patient, family and they are in agreement.  CODE STATUS: full  DVT Prophylaxis: lovenox TOTAL TIME TAKING CARE OF THIS PATIENT: *30* minutes.  >50% time spent on counselling and coordination of care pt and son  POSSIBLE D/C IN 2-3 DAYS, DEPENDING ON CLINICAL CONDITION.  Note: This dictation was prepared with Dragon dictation along with smaller phrase technology. Any transcriptional errors that result from this process are unintentional.  Haygen Zebrowski M.D on 01/16/2017 at 8:33 PM  Between 7am to 6pm - Pager - [phone]  After 6pm go to [email or web address] - Social research officer, government  [facility]  Office  [phone]  CC: Primary care physician; [name], MD

## 2017-01-16 NOTE — Progress Notes (Signed)
Inpatient Diabetes Program Recommendations  AACE/ADA: New Consensus Statement on Inpatient Glycemic Control (2015)  Target Ranges:  Prepandial:   less than 140 mg/dL      Peak postprandial:   less than 180 mg/dL (1-2 hours)      Critically ill patients:  140 - 180 mg/dL   Lab Results  Component Value Date   GLUCAP 242 (H) 01/16/2017   HGBA1C 7.0 (H) 01/11/2017    Review of Glycemic Control  Results for Stephen [name], Stephen [name] (MRN [id number]) as of 01/16/2017 09:21  Ref. Range 01/12/2017 22:10 01/13/2017 08:20 01/13/2017 12:20 01/16/2017 01:31 01/16/2017 07:54  Glucose-Capillary Latest Ref Range: 65 - 99 mg/dL 305 (H) 271 (H) 307 (H) 198 (H) 242 (H)   Diabetes history: Type 2 Outpatient Diabetes medications: Novolin 70/30 70 units qam & 50 units hs? Current orders for Inpatient glycemic control: Novolog 0-9 units tid, Novolog 0-5 units qhs  Inpatient Diabetes Program Recommendations:   Consider Levemir 25 units bid, add Novolog 5 units tid with meals (hold if he eats less than 50%) and continue Novolog correction scales as ordered.   Gentry Fitz, RN, BA, MHA, CDE Diabetes Coordinator Inpatient Diabetes Program  [phone] (Team Pager) [phone] ([place]) 01/16/2017 9:24 AM

## 2017-01-16 NOTE — Progress Notes (Signed)
Inpatient Diabetes Program Recommendations  AACE/ADA: New Consensus Statement on Inpatient Glycemic Control (2015)  Target Ranges:  Prepandial:   less than 140 mg/dL      Peak postprandial:   less than 180 mg/dL (1-2 hours)      Critically ill patients:  140 - 180 mg/dL   Lab Results  Component Value Date   GLUCAP 242 (H) 01/16/2017   HGBA1C 7.0 (H) 01/11/2017    Inpatient Diabetes Program Recommendations:     Spoke with the patient and his wife regarding his insulin administration.  He reports that he takes his am 70/30 insulin "about an hour after breakfast and at bedtime'.  Complains if he were to have a low blood sugar, it is usually between breakfast and lunch.  Drew pictures to show them how the 70/30 insulin works and why it is called 70/30 insulin. The patient and his wife were able to teach back that they should be taking the insulin about 15 minutes before eating breakfast and supper.    Discussed the [facility] support they have and to expect a phone call from [facility] when they get home.  They did not want an inpatient dietitian visit. I reviewed the plate method of dietary intake and stressed the importance of carbohydrates, protein and non-starchy vegetables at each meal for optimal nutrition and for balancing blood sugar. Reminded patient that he should be eating low fat, low sodium for cardiac health.   I have given them the number for the outpatient diabetes education program and suggested they make an outpatient appointment when he is out of the hospital and feeling better.   Gentry Fitz, RN, BA, MHA, CDE Diabetes Coordinator Inpatient Diabetes Program  [phone] (Team Pager) [phone] ([place]) 01/16/2017 2:12 PM

## 2017-01-17 ENCOUNTER — Inpatient Hospital Stay: Payer: Medicare HMO

## 2017-01-17 LAB — GLUCOSE, CAPILLARY
Glucose-Capillary: 288 mg/dL — ABNORMAL HIGH (ref 65–99)
Glucose-Capillary: 357 mg/dL — ABNORMAL HIGH (ref 65–99)
Glucose-Capillary: 202 mg/dL — ABNORMAL HIGH (ref 65–99)
Glucose-Capillary: 122 mg/dL — ABNORMAL HIGH (ref 65–99)

## 2017-01-17 LAB — BASIC METABOLIC PANEL
Anion gap: 8 (ref 5–15)
BUN: 43 mg/dL — ABNORMAL HIGH (ref 6–20)
CO2: 26 mmol/L (ref 22–32)
Calcium: 7.9 mg/dL — ABNORMAL LOW (ref 8.9–10.3)
Chloride: 103 mmol/L (ref 101–111)
Creatinine, Ser: 2.24 mg/dL — ABNORMAL HIGH (ref 0.61–1.24)
GFR calc Af Amer: 33 mL/min — ABNORMAL LOW (ref >60)
GFR calc non Af Amer: 28 mL/min — ABNORMAL LOW (ref >60)
Glucose, Bld: 313 mg/dL — ABNORMAL HIGH (ref 65–99)
Potassium: 3.6 mmol/L (ref 3.5–5.1)
Sodium: 137 mmol/L (ref 135–145)

## 2017-01-17 MED ORDER — INSULIN ASPART 100 UNIT/ML ~~LOC~~ SOLN
10.0000 [IU] | Freq: Three times a day (TID) | SUBCUTANEOUS | Status: DC
  Administered 2017-01-17 – 2017-01-18 (×4): 10 [IU] via SUBCUTANEOUS
  Filled 2017-01-17 (×4): qty 1

## 2017-01-17 MED ORDER — PHENAZOPYRIDINE HCL 100 MG PO TABS
100.0000 mg | ORAL_TABLET | Freq: Once | ORAL | Status: AC
  Administered 2017-01-17: 100 mg via ORAL
  Filled 2017-01-17 (×2): qty 1

## 2017-01-17 MED ORDER — INSULIN DETEMIR 100 UNIT/ML ~~LOC~~ SOLN
30.0000 [IU] | Freq: Two times a day (BID) | SUBCUTANEOUS | Status: DC
  Administered 2017-01-17 – 2017-01-18 (×2): 30 [IU] via SUBCUTANEOUS
  Filled 2017-01-17 (×4): qty 0.3

## 2017-01-17 NOTE — Progress Notes (Signed)
Inpatient Diabetes Program Recommendations  AACE/ADA: New Consensus Statement on Inpatient Glycemic Control (2015)  Target Ranges:  Prepandial:   less than 140 mg/dL      Peak postprandial:   less than 180 mg/dL (1-2 hours)      Critically ill patients:  140 - 180 mg/dL   Results for Stephen [name], Stephen [name] (MRN [id number]) as of 01/17/2017 08:38  Ref. Range 01/16/2017 07:54 01/16/2017 12:05 01/16/2017 12:10 01/16/2017 16:35 01/16/2017 20:40 01/17/2017 07:51  Glucose-Capillary Latest Ref Range: 65 - 99 mg/dL 242 (H) 427 (H) 389 (H) 386 (H) 400 (H) 288 (H)   Review of Glycemic Control  Diabetes history: DM2 Outpatient Diabetes medications: 70/30 70 units QAM, 70/30 50 units QHS Current orders for Inpatient glycemic control: Levemir 25 units BID, Novolog 5 units TID with meals, Novolog 0-9 units TID with meals, Novolog 0-5 units QHS  Inpatient Diabetes Program Recommendations:  Insulin - Basal: Please consider increasing Levemir to 30 units BID. Insulin - Meal Coverage: Please consider increasing meal coverage to Novolog 10 units TID with meals.  Thanks, Barnie Alderman, RN, MSN, CDE Diabetes Coordinator Inpatient Diabetes Program [phone] (Team Pager from 8am to 5pm)

## 2017-01-17 NOTE — Plan of Care (Signed)
Problem: Pain Managment: Goal: General experience of comfort will improve Outcome: Not Progressing Patient c/o abdominal pain and pain with urination. KUB ordered and PRN oxycodone given per MAR. One time dose of pyridium ordered and given for urinary symptoms. Will continue to monitor.

## 2017-01-17 NOTE — Progress Notes (Signed)
Patient c/o abdominal pain, MD notified, KUB ordered. Patient also c/o burning with urination, one time dose of Pyridium ordered and given.

## 2017-01-17 NOTE — Consult Note (Signed)
[facility] Inpatient Consult   01/17/2017  AKHARI WASHKO [birth year] [id number]  Chart review revealed patient eligible for [facility] Care Management services and post hospital discharge follow up related to a diagnosis of DM and HF. Patient was evaluated for community based chronic disease management services with [facility] care Management Program as a benefit of patient's [facility] Medicare. Met with the patient and spouse Erskine Squibb at the bedside to explain [facility] Care Management services. Patient endorses his primary care provider to be Dr. Para March. Gave patient phone numbers for [facility] and Transportation program for use after discharge if needed.  Consent form signed. Patient gave [phone] as the best number to reach him or his spouse Erskine Squibb. He also gave written permission to call his daughter French Ana walker at [phone] if he or his spouse can not be reached. Patient will receive post hospital discharge calls and be evaluated for monthly home visits. [facility] Care Management services does not interfere with or replace any services arranged by the inpatient care management team. RNCM left contact information and [facility] literature at the bedside. Made inpatient RNCM aware that [facility] will be following for care management. For additional questions please contact:   Aviel Davalos RN, BSN [facility] Liaison  [phone]) Business Mobile [phone]) Toll free office

## 2017-01-17 NOTE — Progress Notes (Signed)
[facility] [place] [facility]   PATIENT NAME: Stephen [name]    MR#:  [id number]  DATE OF BIRTH:  [birth year]  SUBJECTIVE:   Came in with increasing weakness and burning in urine Weak. Lower abdominal pain. REVIEW OF SYSTEMS:   Review of Systems  Constitutional: Positive for diaphoresis. Negative for chills, fever and weight loss.  HENT: Negative for ear discharge, ear pain and nosebleeds.   Eyes: Negative for blurred vision, pain and discharge.  Respiratory: Negative for sputum production, shortness of breath, wheezing and stridor.   Cardiovascular: Negative for chest pain, palpitations, orthopnea and PND.  Gastrointestinal: Negative for abdominal pain, diarrhea, nausea and vomiting.  Genitourinary: Positive for dysuria. Negative for frequency and urgency.  Musculoskeletal: Positive for back pain. Negative for joint pain.  Neurological: Positive for weakness. Negative for sensory change, speech change and focal weakness.  Psychiatric/Behavioral: Negative for depression and hallucinations. The patient is not nervous/anxious.    Tolerating Diet:yes Tolerating PT: Ambulatory  DRUG ALLERGIES:   Allergies  Allergen Reactions  . Calcium Channel Blockers     Would avoid if possible due to h/o peripheral edema    VITALS:  Blood pressure (!) 146/79, pulse 72, temperature 98.3 F (36.8 C), temperature source Oral, resp. rate 20, height 5\' 5"  (1.651 m), weight 116.6 kg (257 lb 1.6 oz), SpO2 91 %.  PHYSICAL EXAMINATION:   Physical Exam  GENERAL:  71 y.o.-year-old patient lying in the bed with no acute distress. obese EYES: Pupils equal, round, reactive to light and accommodation. No scleral icterus. Extraocular muscles intact.  HEENT: Head atraumatic, normocephalic. Oropharynx and nasopharynx clear.  NECK:  Supple, no jugular venous distention. No thyroid enlargement, no tenderness.  LUNGS: Normal breath sounds bilaterally, no wheezing, rales,  rhonchi. No use of accessory muscles of respiration.  CARDIOVASCULAR: S1, S2 normal. No murmurs, rubs, or gallops.  ABDOMEN: Soft, nontender, nondistended. Bowel sounds present. No organomegaly or mass.  EXTREMITIES: No cyanosis, clubbing or edema b/l.    NEUROLOGIC: Cranial nerves II through XII are intact. No focal Motor or sensory deficits b/l.   PSYCHIATRIC:  patient is alert and oriented x 3.  SKIN: No obvious rash, lesion, or ulcer.   LABORATORY PANEL:  CBC  Recent Labs Lab 01/16/17 0150  WBC 20.4*  HGB 13.1  HCT 39.6*  PLT 249    Chemistries   Recent Labs Lab 01/15/17 1959  01/16/17 1403 01/17/17 0502  NA 133*  < >  --  137  K 3.2*  < >  --  3.6  CL 95*  < >  --  103  CO2 25  < >  --  26  GLUCOSE 106*  < >  --  313*  BUN 63*  < >  --  43*  CREATININE 3.07*  < >  --  2.24*  CALCIUM 8.7*  < >  --  7.9*  MG  --   --  1.9  --   AST 80*  --   --   --   ALT 68*  --   --   --   ALKPHOS 80  --   --   --   BILITOT 1.3*  --   --   --   < > = values in this interval not displayed. Cardiac Enzymes  Recent Labs Lab 01/16/17 1403  TROPONINI 4.91*   RADIOLOGY:  Dg Chest Port 1 View  Result Date: 01/15/2017 CLINICAL DATA:  Sepsis. Patient underwent catheterization  for non ST MI on the twelfth. Dysuria and weakness for the past 3 days. EXAM: PORTABLE CHEST 1 VIEW COMPARISON:  None. FINDINGS: Heart size is top normal in size and stable with aortic atherosclerosis. No aortic aneurysm. No pneumonic consolidation. Chronic elevation and/or eventration of the right hemidiaphragm with bibasilar atelectasis and/or scarring. No overt pulmonary edema. No effusions. 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 IMPRESSION: 1. Borderline cardiomegaly with aortic atherosclerosis. 2. Bibasilar atelectasis and/or scarring. No acute pulmonary disease. Electronically Signed   By: Tollie Eth M.D.   On: 01/15/2017 20:35   ASSESSMENT AND PLAN:  Stephen [name]  is a 71  y.o. male who presents with Dysuria. Patient states that for the past couple of days he's had significant burning with urination. He came to the ED today and was found to have an elevated white count and a UTI. He states he had a fever at home  * Ecoli sepsis source UTI (urinary tract infection)  - IV  Rocephin  -UC Ecoli  *  Acute on chronic renal failure (HCC) - continue home meds -Baseline creatinine 1.69 -came in with creat of 3.07 --2.6--2.24  *Type 2 diabetes mellitus with renal complication (HCC)  - sliding scale insulin with corresponding glucose checks  * Essential hypertension, benign - continue home meds  *Chronic diastolic CHF (congestive heart failure) (HCC) - continue home medications  * Elevated troponin - patient was recently admitted here with non-STEMI. His troponin checked at that time was 10. He then underwent procedure and was placed on medical management. Troponin here today 16.We will trend his enzymes to make sure that they are truly coming down. -seen by cardiology continue medical management  Case discussed with Care Management/Social Worker. Management plans discussed with the patient, family and they are in agreement.  CODE STATUS: full  DVT Prophylaxis: lovenox TOTAL TIME TAKING CARE OF THIS PATIENT: *30* minutes.  >50% time spent on counselling and coordination of care pt and son  POSSIBLE D/C IN 2-3 DAYS, DEPENDING ON CLINICAL CONDITION.  Note: This dictation was prepared with Dragon dictation along with smaller phrase technology. Any transcriptional errors that result from this process are unintentional.  Raylei Losurdo M.D on 01/17/2017 at 3:46 PM  Between 7am to 6pm - Pager - [phone]  After 6pm go to [email or web address] - Social research officer, government  [facility]  Office  [phone]  CC: Primary care physician; [name], MD

## 2017-01-18 ENCOUNTER — Telehealth: Payer: Self-pay

## 2017-01-18 LAB — BASIC METABOLIC PANEL
Anion gap: 6 (ref 5–15)
BUN: 38 mg/dL — ABNORMAL HIGH (ref 6–20)
CO2: 26 mmol/L (ref 22–32)
Calcium: 8.2 mg/dL — ABNORMAL LOW (ref 8.9–10.3)
Chloride: 106 mmol/L (ref 101–111)
Creatinine, Ser: 2.13 mg/dL — ABNORMAL HIGH (ref 0.61–1.24)
GFR calc Af Amer: 35 mL/min — ABNORMAL LOW (ref >60)
GFR calc non Af Amer: 30 mL/min — ABNORMAL LOW (ref >60)
Glucose, Bld: 237 mg/dL — ABNORMAL HIGH (ref 65–99)
Potassium: 4 mmol/L (ref 3.5–5.1)
Sodium: 138 mmol/L (ref 135–145)

## 2017-01-18 LAB — CBC
HCT: 38.3 % — ABNORMAL LOW (ref 40.0–52.0)
Hemoglobin: 12.7 g/dL — ABNORMAL LOW (ref 13.0–18.0)
MCH: 28.3 pg (ref 26.0–34.0)
MCHC: 33.2 g/dL (ref 32.0–36.0)
MCV: 85 fL (ref 80.0–100.0)
Platelets: 278 10*3/uL (ref 150–440)
RBC: 4.5 MIL/uL (ref 4.40–5.90)
RDW: 14.9 % — ABNORMAL HIGH (ref 11.5–14.5)
WBC: 12.8 10*3/uL — ABNORMAL HIGH (ref 3.8–10.6)

## 2017-01-18 LAB — CULTURE, BLOOD (ROUTINE X 2): Special Requests: ADEQUATE

## 2017-01-18 LAB — URINE CULTURE
Culture: >=100000 — AB
Culture: >=100000 — AB
Special Requests: NORMAL

## 2017-01-18 LAB — GLUCOSE, CAPILLARY
Glucose-Capillary: 254 mg/dL — ABNORMAL HIGH (ref 65–99)
Glucose-Capillary: 258 mg/dL — ABNORMAL HIGH (ref 65–99)

## 2017-01-18 MED ORDER — OXYCODONE HCL 5 MG PO TABS
10.0000 mg | ORAL_TABLET | ORAL | Status: DC | PRN
  Administered 2017-01-18: 10 mg via ORAL
  Filled 2017-01-18: qty 2

## 2017-01-18 MED ORDER — CEPHALEXIN 500 MG PO CAPS
500.0000 mg | ORAL_CAPSULE | Freq: Two times a day (BID) | ORAL | Status: DC
  Administered 2017-01-18: 500 mg via ORAL
  Filled 2017-01-18: qty 1

## 2017-01-18 MED ORDER — NITROGLYCERIN 0.4 MG SL SUBL: 0.4000 mg | SUBLINGUAL_TABLET | SUBLINGUAL | 99 refills | Status: DC | PRN

## 2017-01-18 MED ORDER — CEPHALEXIN 500 MG PO CAPS: 500.0000 mg | ORAL_CAPSULE | Freq: Two times a day (BID) | ORAL | 0 refills | Status: DC

## 2017-01-18 NOTE — Telephone Encounter (Signed)
Pt is leaving the hospital now; was in hospital for UTI and pt stated had heart attack last week; pt has appt with Dr [name] on 01/22/17 at [facility] for hospital f/u and pt is going to schedule appt with cards but pt has old ntg  pt request new rx ntg to [place]. Pt wants to have on hand if needed. Please advise. Refill done per protocol but FYI to Dr [name].

## 2017-01-18 NOTE — Telephone Encounter (Signed)
Thanks for getting that sent in.  Agreed.

## 2017-01-18 NOTE — Care Management (Signed)
Has accepted thn referral.  Has access to scales but does not  weigh daily.  Interested in having an appointment with [facility].  Appointment made.  Patient and his wife deny any need for home health nurse follow up.

## 2017-01-18 NOTE — Discharge Summary (Signed)
[facility] - [place] at [facility]   PATIENT NAME: Stephen [name]    MR#:  [id number]  DATE OF BIRTH:  01-09-1946  DATE OF ADMISSION:  01/15/2017 ADMITTING PHYSICIAN: Oralia Manis, MD  DATE OF DISCHARGE: 01/18/2017  PRIMARY CARE PHYSICIAN: [name], MD    ADMISSION DIAGNOSIS:  Acute renal insufficiency [N28.9] Elevated troponin [R74.8] Cystitis [N30.90]  DISCHARGE DIAGNOSIS:  Sepsis secondary to Escherichia coli Escherichia coli UTI Acute on chronic kidney failure secondary to sepsis improving Recent non-Q-wave MI  SECONDARY DIAGNOSIS:   Past Medical History:  Diagnosis Date  . Adrenal gland anomaly   . Arthritis   . CAD (coronary artery disease)    s/p MI x 3 now with 2 stents  . CHF (congestive heart failure) (HCC)   . Colon polyps   . Diabetes mellitus with neuropathy (HCC)   . DM2 (diabetes mellitus, type 2) (HCC)    insulin requiring  . Dyslipidemia   . GERD (gastroesophageal reflux disease)   . Heart disease   . High cholesterol   . HTN (hypertension)   . Kidney disease   . Kidney stones   . Metabolic syndrome   . OSA (obstructive sleep apnea)   . 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 Recurrent UTI   . Renal artery stenosis (HCC)    L sided, stent 07/2013  . Rotator cuff disorder    right  . Skin cancer    head  . Thyroid disorder     HOSPITAL COURSE:   Stephen [name] a 14 y.o.malewho presents with Dysuria. Patient states that for the past couple of days he's had significant burning with urination. He came to the ED today and was found to have an elevated white count and a UTI. He states he had a fever at home  * Ecoli sepsis source UTI (urinary tract infection)  - IV  Rocephin-changed to oral Keflex will give total 10 days-- -UC Ecoli  *Acute on chronic renal failure (HCC) - continue home meds -Baseline creatinine 1.69 -came  in with creat of 3.07 --2.6--2.24--- 2.14 -Good urine output -Hold Lasix until creatinine improves further. BMP will be checked by primary care physician on follow-up appointment next Monday  *Type 2 diabetes mellitus with renal complication (HCC)  - sliding scale insulin with corresponding glucose checks -Resume home dose insulin  *Essential hypertension, benign - continue home meds -Holding torsemide and hydrochlorothiazide/triamterene since creatinine elevated   *Chronic diastolic CHF (congestive heart failure) (HCC) - continue home medications  * Elevated troponin - patient was recently admitted here with non-STEMI. -seen by cardiology continue medical management -Patient doing well. Troponin trending down very nicely. No chest pain. Continue cardiac meds and follow-up with cardiology as prior to appointment.  CONSULTS OBTAINED:    DRUG ALLERGIES:   Allergies  Allergen Reactions  . Calcium Channel Blockers     Would avoid if possible due to h/o peripheral edema    DISCHARGE MEDICATIONS:   Current Discharge Medication List    START taking these medications   Details  cephALEXin (KEFLEX) 500 MG capsule Take 1 capsule (500 mg total) by mouth 2 (two) times daily. Qty: 14 capsule, Refills: 0      CONTINUE these medications which have NOT CHANGED   Details  amLODipine (NORVASC) 5 MG tablet Take 1 tablet (5 mg total) by mouth daily. Qty: 30 tablet, Refills: 1    aspirin 81  MG tablet Take 324 mg by mouth daily.     atorvastatin (LIPITOR) 80 MG tablet TAKE 1 TABLET AT BEDTIME Qty: 90 tablet, Refills: 2    carvedilol (COREG) 25 MG tablet TAKE 1 TABLET TWICE DAILY WITH A MEAL Qty: 180 tablet, Refills: 2    cloNIDine (CATAPRES) 0.3 MG tablet TAKE 1 TABLET THREE TIMES DAILY Qty: 270 tablet, Refills: 3    clopidogrel (PLAVIX) 75 MG tablet Take 1 tablet (75 mg total) by mouth daily. Qty: 30 tablet, Refills: 3    gabapentin (NEURONTIN) 800 MG tablet TAKE 1 TABLET IN  THE MORNING  AND TAKE 1 AND 1/2 TABLETS AT BEDTIME Qty: 225 tablet, Refills: 3    hydrALAZINE (APRESOLINE) 50 MG tablet Take 1 tablet (50 mg total) by mouth 3 (three) times daily. Qty: 270 tablet, Refills: 4    insulin NPH-regular Human (NOVOLIN 70/30) (70-30) 100 UNIT/ML injection INJECT  70 UNITS SUBCUTANEOUSLY EVERY MORNING  AND  35-45 UNITS IN THE EVENING Qty: 10 vial, Refills: 1    isosorbide mononitrate (IMDUR) 30 MG 24 hr tablet Take 3 tablets (90 mg total) by mouth 2 (two) times daily. Qty: 180 tablet, Refills: 0    nitroGLYCERIN (NITROSTAT) 0.4 MG SL tablet Place 1 tablet (0.4 mg total) under the tongue every 5 (five) minutes as needed. May repeat x3 Qty: 25 tablet, Refills: prn    omeprazole (PRILOSEC) 20 MG capsule Take 20 mg by mouth daily.      oxyCODONE (ROXICODONE) 15 MG immediate release tablet Take 1 tablet (15 mg total) by mouth every evening. Qty: 30 tablet, Refills: 0    ACCU-CHEK SOFTCLIX LANCETS lancets Use as instructed to check blood sugar three times daily and as needed.  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 Qty: 100 each, Refills: 5    Blood Glucose Monitoring Suppl (ACCU-CHEK AVIVA PLUS) w/Device KIT Use to check blood sugar three times daily and as needed.  Diagnosis: E11.22  Insulin-dependent Qty: 1 kit, Refills: 0    glucose blood (ACCU-CHEK AVIVA PLUS) test strip Use as instructed to check blood sugar 3 times daily or as needed.  Diagnosis:  E11.22  Insulin-dependent. Qty: 300 each, Refills: 3    Insulin Syringe-Needle U-100 (INSULIN SYRINGE 1CC/30GX5/16") 30G X 5/16" 1 ML MISC Use as directed to take insulin Dx 250.62 Qty: 180 each, Refills: 1    NON FORMULARY Oxygen 2 liters 24/7      STOP taking these medications     torsemide (DEMADEX) 20 MG tablet      triamterene-hydrochlorothiazide (MAXZIDE-25) 37.5-25 MG tablet         If you experience worsening of your admission symptoms, develop shortness of breath, life threatening emergency,  suicidal or homicidal thoughts you must seek medical attention immediately by calling 911 or calling your MD immediately  if symptoms less severe.  You Must read complete instructions/literature along with all the possible adverse reactions/side effects for all the Medicines you take and that have been prescribed to you. Take any new Medicines after you have completely understood and accept all the possible adverse reactions/side effects.   Please note  You were cared for by a hospitalist during your hospital stay. If you have any questions about your discharge medications or the care you received while you were in the hospital after you are discharged, you can call the unit and asked to speak with the hospitalist on call if the hospitalist that took care of you is not available. Once you are discharged, your primary  care physician will handle any further medical issues. Please note that NO REFILLS for any discharge medications will be authorized once you are discharged, as it is imperative that you return to your primary care physician (or establish a relationship with a primary care physician if you do not have one) for your aftercare needs so that they can reassess your need for medications and monitor your lab values. Today   SUBJECTIVE   Feeling better. Has had several bowel movements for relieving the constipation.   VITAL SIGNS:  Blood pressure (!) 155/81, pulse 79, temperature 97.8 F (36.6 C), temperature source Oral, resp. rate 17, height 5\' 5"  (1.651 m), weight 118.2 kg (260 lb 9.6 oz), SpO2 96 %.  I/O:   Intake/Output Summary (Last 24 hours) at 01/18/17 1205 Last data filed at 01/18/17 0912  Gross per 24 hour  Intake              480 ml  Output               75 ml  Net              405 ml    PHYSICAL EXAMINATION:  GENERAL:  71 y.o.-year-old patient lying in the bed with no acute distress.  obese  EYES: Pupils equal, round, reactive to light and accommodation. No scleral  icterus. Extraocular muscles intact.  HEENT: Head atraumatic, normocephalic. Oropharynx and nasopharynx clear.  NECK:  Supple, no jugular venous distention. No thyroid enlargement, no tenderness.  LUNGS: Normal breath sounds bilaterally, no wheezing, rales,rhonchi or crepitation. No use of accessory muscles of respiration.  CARDIOVASCULAR: S1, S2 normal. No murmurs, rubs, or gallops.  ABDOMEN: Soft, non-tender, non-distended. Bowel sounds present. No organomegaly or mass.  EXTREMITIES: No pedal edema, cyanosis, or clubbing.  NEUROLOGIC: Cranial nerves II through XII are intact. Muscle strength 5/5 in all extremities. Sensation intact. Gait not checked.  PSYCHIATRIC: The patient is alert and oriented x 3.  SKIN: No obvious rash, lesion, or ulcer.   DATA REVIEW:   CBC   Recent Labs Lab 01/18/17 0348  WBC 12.8*  HGB 12.7*  HCT 38.3*  PLT 278    Chemistries   Recent Labs Lab 01/15/17 1959  01/16/17 1403  01/18/17 0348  NA 133*  < >  --   < > 138  K 3.2*  < >  --   < > 4.0  CL 95*  < >  --   < > 106  CO2 25  < >  --   < > 26  GLUCOSE 106*  < >  --   < > 237*  BUN 63*  < >  --   < > 38*  CREATININE 3.07*  < >  --   < > 2.13*  CALCIUM 8.7*  < >  --   < > 8.2*  MG  --   --  1.9  --   --   AST 80*  --   --   --   --   ALT 68*  --   --   --   --   ALKPHOS 80  --   --   --   --   BILITOT 1.3*  --   --   --   --   < > = values in this interval not displayed.  Microbiology Results   Recent Results (from the past 240 hour(s))  MRSA PCR Screening     Status: None  Collection Time: 01/11/17  6:07 PM  Result Value Ref Range Status   MRSA by PCR NEGATIVE NEGATIVE Final    Comment:        The GeneXpert MRSA Assay (FDA approved for NASAL specimens only), is one component of a comprehensive MRSA colonization surveillance program. It is not intended to diagnose MRSA infection nor to guide or monitor treatment for MRSA infections.   Urine culture     Status: Abnormal    Collection Time: 01/15/17  5:26 PM  Result Value Ref Range Status   Specimen Description URINE, CLEAN CATCH  Final   Special Requests Normal  Final   Culture >=100,000 COLONIES/mL ESCHERICHIA COLI (A)  Final   Report Status 01/18/2017 FINAL  Final   Organism ID, Bacteria ESCHERICHIA COLI (A)  Final      Susceptibility   Escherichia coli - MIC*    AMPICILLIN 4 SENSITIVE Sensitive     CEFAZOLIN <=4 SENSITIVE Sensitive     CEFTRIAXONE <=1 SENSITIVE Sensitive     CIPROFLOXACIN <=0.25 SENSITIVE Sensitive     GENTAMICIN <=1 SENSITIVE Sensitive     IMIPENEM <=0.25 SENSITIVE Sensitive     NITROFURANTOIN <=16 SENSITIVE Sensitive     TRIMETH/SULFA <=20 SENSITIVE Sensitive     AMPICILLIN/SULBACTAM <=2 SENSITIVE Sensitive     PIP/TAZO <=4 SENSITIVE Sensitive     Extended ESBL NEGATIVE Sensitive     * >=100,000 COLONIES/mL ESCHERICHIA COLI  Urine culture     Status: Abnormal   Collection Time: 01/15/17  7:36 PM  Result Value Ref Range Status   Specimen Description URINE, RANDOM  Final   Special Requests NONE  Final   Culture >=100,000 COLONIES/mL ESCHERICHIA COLI (A)  Final   Report Status 01/18/2017 FINAL  Final   Organism ID, Bacteria ESCHERICHIA COLI (A)  Final      Susceptibility   Escherichia coli - MIC*    AMPICILLIN 4 SENSITIVE Sensitive     CEFAZOLIN <=4 SENSITIVE Sensitive     CEFTRIAXONE <=1 SENSITIVE Sensitive     CIPROFLOXACIN <=0.25 SENSITIVE Sensitive     GENTAMICIN <=1 SENSITIVE Sensitive     IMIPENEM <=0.25 SENSITIVE Sensitive     NITROFURANTOIN <=16 SENSITIVE Sensitive     TRIMETH/SULFA <=20 SENSITIVE Sensitive     AMPICILLIN/SULBACTAM <=2 SENSITIVE Sensitive     PIP/TAZO <=4 SENSITIVE Sensitive     Extended ESBL NEGATIVE Sensitive     * >=100,000 COLONIES/mL ESCHERICHIA COLI  Blood Culture (routine x 2)     Status: None (Preliminary result)   Collection Time: 01/15/17  7:59 PM  Result Value Ref Range Status   Specimen Description BLOOD BLOOD LEFT FOREARM  Final    Special Requests   Final    BOTTLES DRAWN AEROBIC AND ANAEROBIC Blood Culture results may not be optimal due to an excessive volume of blood received in culture bottles   Culture NO GROWTH 3 DAYS  Final   Report Status PENDING  Incomplete  Blood Culture (routine x 2)     Status: Abnormal   Collection Time: 01/15/17  7:59 PM  Result Value Ref Range Status   Specimen Description BLOOD BLOOD RIGHT ARM  Final   Special Requests   Final    BOTTLES DRAWN AEROBIC AND ANAEROBIC Blood Culture adequate volume   Culture  Setup Time   Final    GRAM NEGATIVE RODS AEROBIC BOTTLE ONLY CRITICAL RESULT CALLED TO, READ BACK BY AND VERIFIED WITH: KAREN HAYES 01/16/17 1427 KLW Performed at [facility]  Lab, [address] [place], [place] [address]    Culture ESCHERICHIA COLI (A)  Final   Report Status 01/18/2017 FINAL  Final   Organism ID, Bacteria ESCHERICHIA COLI  Final      Susceptibility   Escherichia coli - MIC*    AMPICILLIN 4 SENSITIVE Sensitive     CEFAZOLIN <=4 SENSITIVE Sensitive     CEFEPIME <=1 SENSITIVE Sensitive     CEFTAZIDIME <=1 SENSITIVE Sensitive     CEFTRIAXONE <=1 SENSITIVE Sensitive     CIPROFLOXACIN <=0.25 SENSITIVE Sensitive     GENTAMICIN <=1 SENSITIVE Sensitive     IMIPENEM <=0.25 SENSITIVE Sensitive     TRIMETH/SULFA <=20 SENSITIVE Sensitive     AMPICILLIN/SULBACTAM <=2 SENSITIVE Sensitive     PIP/TAZO <=4 SENSITIVE Sensitive     Extended ESBL NEGATIVE Sensitive     * ESCHERICHIA COLI  Blood Culture ID Panel (Reflexed)     Status: Abnormal   Collection Time: 01/15/17  7:59 PM  Result Value Ref Range Status   Enterococcus species NOT DETECTED NOT DETECTED Final   Listeria monocytogenes NOT DETECTED NOT DETECTED Final   Staphylococcus species NOT DETECTED NOT DETECTED Final   Staphylococcus aureus NOT DETECTED NOT DETECTED Final   Streptococcus species NOT DETECTED NOT DETECTED Final   Streptococcus agalactiae NOT DETECTED NOT DETECTED Final   Streptococcus  pneumoniae NOT DETECTED NOT DETECTED Final   Streptococcus pyogenes NOT DETECTED NOT DETECTED Final   Acinetobacter baumannii NOT DETECTED NOT DETECTED Final   Enterobacteriaceae species DETECTED (A) NOT DETECTED Final    Comment: Enterobacteriaceae represent a large family of gram-negative bacteria, not a single organism. CRITICAL RESULT CALLED TO, READ BACK BY AND VERIFIED WITH: KAREN HAYES 01/16/17 1427 KLW    Enterobacter cloacae complex NOT DETECTED NOT DETECTED Final   Escherichia coli DETECTED (A) NOT DETECTED Final    Comment: CRITICAL RESULT CALLED TO, READ BACK BY AND VERIFIED WITH: KAREN HAYES 01/16/17 1427 KLW    Klebsiella oxytoca NOT DETECTED NOT DETECTED Final   Klebsiella pneumoniae NOT DETECTED NOT DETECTED Final   Proteus species NOT DETECTED NOT DETECTED Final   Serratia marcescens NOT DETECTED NOT DETECTED Final   Carbapenem resistance NOT DETECTED NOT DETECTED Final   Haemophilus influenzae NOT DETECTED NOT DETECTED Final   Neisseria meningitidis NOT DETECTED NOT DETECTED Final   Pseudomonas aeruginosa NOT DETECTED NOT DETECTED Final   Candida albicans NOT DETECTED NOT DETECTED Final   Candida glabrata NOT DETECTED NOT DETECTED Final   Candida krusei NOT DETECTED NOT DETECTED Final   Candida parapsilosis NOT DETECTED NOT DETECTED Final   Candida tropicalis NOT DETECTED NOT DETECTED Final    RADIOLOGY:  Dg Abd 1 View  Result Date: 01/17/2017 CLINICAL DATA:  71 year old male with abdominal pain. Recent NSTEMI. EXAM: ABDOMEN - 1 VIEW COMPARISON:  CT Abdomen and Pelvis 11/23/2016. FINDINGS: Three views of the abdomen and pelvis. Increased mild to moderate gas distention of the stomach. Otherwise similar bowel gas pattern to the may CT Abdomen and Pelvis. Mildly prominent gas and stool-filled proximal large bowel. Stable cholecystectomy clips. Stable visualized osseous structures. IMPRESSION: New mild to moderate gaseous distension of the stomach. Otherwise stable and non  obstructed bowel gas pattern since the CT on 11/23/2016. Electronically Signed   By: Odessa Fleming M.D.   On: 01/17/2017 16:12     Management plans discussed with the patient, family and they are in agreement.  CODE STATUS:     Code Status Orders  Start     Ordered   01/16/17 0035  Full code  Continuous     01/16/17 0034    Code Status History    Date Active Date Inactive Code Status Order ID Comments User Context   01/11/2017  4:54 PM 01/13/2017  5:36 PM Full Code [id number]  Shaune Pollack, MD Inpatient   01/11/2017  1:41 PM 01/11/2017  4:54 PM Full Code [id number]  Iran Ouch, MD Inpatient   03/20/2012 11:10 PM 03/21/2012  1:50 PM Full Code [id number]  Trixie Dredge, PA ED      TOTAL TIME TAKING CARE OF THIS PATIENT: *40* minutes.    Margia Wiesen M.D on 01/18/2017 at 12:05 PM  Between 7am to 6pm - Pager - [phone] After 6pm go to [email or web address] - Social research officer, government  [facility]  Office  [phone]  CC: Primary care physician; [name], MD

## 2017-01-18 NOTE — Progress Notes (Signed)
IVs and tele removed from patient. Discharge instructions given to patient and wife. Verbalized understanding. No distress at this time. Patients portable home oxygen in the room for transportation. Wife at bedside and will be transporting patient home.

## 2017-01-18 NOTE — Discharge Instructions (Signed)
Heart Failure Clinic appointment on January 31 2017 at 11:40am with Darylene Price, [place]. Please call [phone] to reschedule.

## 2017-01-19 ENCOUNTER — Other Ambulatory Visit: Payer: Self-pay | Admitting: *Deleted

## 2017-01-19 ENCOUNTER — Ambulatory Visit: Payer: Self-pay | Admitting: *Deleted

## 2017-01-19 ENCOUNTER — Encounter: Payer: Self-pay | Admitting: *Deleted

## 2017-01-19 NOTE — Patient Outreach (Signed)
Attempt made to contact pt, follow up on referral from [place] hospital liaison 7/19 for community CM services/follow for transition of care with recent readmission 7/16- 01/18/17 for UTI, Acute renal insufficiency, elevated Troponin.   Referral also received from Gentry Fitz RN diabetes coordinator on 01/15/17 to follow pt for transition of care/recent hospitalization 7/12-7/14/18 for left heart cath, NSTEMI, Diabetes with renal complications, CAD, Hypertension,HF.   Person answering the phone identified herself as pt's spouse [name], reports pt is sleeping.   RN CM discussed with spouse reason for call,  contact name and number provided for pt  to return call.    Plan:  If no response back from pt, plan to follow up again next business day/next week.    Zara Chess.   [facility]  [phone]

## 2017-01-19 NOTE — Patient Outreach (Signed)
Successful telephone encounter to Stephen [name], 71 year old male to follow up on referral received from [place] hospital liaison 7/19 for community CM services,follow for transition of care with recent readmission 01/15/17 to 01/18/17 for UTI, Acute renal insufficiency, elevated Troponin.  Previous hospitalization 01/11/17 to 01/13/17 for left heart cath,NSTEMI, diabetes with renal complications, CAD, Hypertension,HF.   Spoke with pt, HIPAA identifier provided, discussed purpose of call- follow up for transition of care.   Pt reports doing better, requested RN CM to speak with spouse Stephen [name] about his discharge/medications.  Spouse reports pt had a heart attack last week and then was readmitted for UTI, sepsis, pt been staying on 4 Liters [place] to keep O 2 sat 93-94 %, 2 liters was not enough, plan to call Lung MD schedule appointment.   Spouse reports pt taking all of his medications, no problems affording- pt does mail order through [facility], no co pays, only pays for insulin (70/30 Novolin- $25 at [facility]). * Patient was recently discharged from hospital and all medications have been reviewed.    Spouse reports pt was instructed not to fluid pill until see PCP 01/22/17, has a little swelling in ankles, not been weighing, plans to have pt start to which RN CM discussed calling MD for weight gain of 3 lbs in a day, 5 lbs in a week - spouse voiced understanding.  Spouse reports pt  Has appointment for HF clinic 01/31/17, plan to call Heart MD schedule appointment.   Spouse reports pt's sugar today was 137.  RN CM discussed with spouse [facility] transition of care program- follow pt for 31 days (weekly phone calls, a home visit) - no cost/benefit of his insurance.   Plan:  As discussed with spouse, plan to continue to follow pt for transition of care- follow up again next week telephonically and the following week- initial home visit.           Barrier letter sent to Dr. [name] informing of [facility] involvement.    Zara Chess.    [facility]  [phone]

## 2017-01-20 LAB — CULTURE, BLOOD (ROUTINE X 2): Culture: NO GROWTH

## 2017-01-22 ENCOUNTER — Encounter: Payer: Self-pay | Admitting: Family Medicine

## 2017-01-22 ENCOUNTER — Ambulatory Visit (INDEPENDENT_AMBULATORY_CARE_PROVIDER_SITE_OTHER): Payer: Medicare HMO | Admitting: Family Medicine

## 2017-01-22 VITALS — BP 130/58 | HR 65 | Temp 98.3°F | Wt 265.5 lb

## 2017-01-22 DIAGNOSIS — N390 Urinary tract infection, site not specified: Secondary | ICD-10-CM

## 2017-01-22 DIAGNOSIS — J982 Interstitial emphysema: Secondary | ICD-10-CM | POA: Diagnosis not present

## 2017-01-22 DIAGNOSIS — R319 Hematuria, unspecified: Secondary | ICD-10-CM | POA: Diagnosis not present

## 2017-01-22 MED ORDER — INSULIN NPH ISOPHANE & REGULAR (70-30) 100 UNIT/ML ~~LOC~~ SUSP: SUBCUTANEOUS | 1 refills | Status: DC

## 2017-01-22 MED ORDER — HYDRALAZINE HCL 50 MG PO TABS: 50.0000 mg | ORAL_TABLET | Freq: Three times a day (TID) | ORAL | 3 refills | Status: DC

## 2017-01-22 MED ORDER — HYDRALAZINE HCL 50 MG PO TABS: 50.0000 mg | ORAL_TABLET | Freq: Three times a day (TID) | ORAL | 0 refills | Status: DC

## 2017-01-22 MED ORDER — OXYCODONE HCL 15 MG PO TABS: 15.0000 mg | ORAL_TABLET | Freq: Every evening | ORAL | 0 refills | Status: DC

## 2017-01-22 NOTE — Patient Instructions (Signed)
Go to the lab on the way out.  We'll contact you with your lab report. Get a urine culture done about 4 days after you finish the antibiotics.  Take care.  Glad to see you.  We'll be in touch.

## 2017-01-22 NOTE — Progress Notes (Signed)
Inpatient follow-up. Admitted with non-ST elevation MI with catheterization done. He was discharged from that hospitalization but unfortunately had significant dysuria and required readmission with UTI.  Inpatient blood and urine cultures both positive with Escherichia coli. Discussed with patient about hospital course. He was appropriately treated with diuresis and routine cardiac care during hospitalization with his MI. He subsequently required fluid resuscitation and antibiotics for his UTI. Rationale discussed with patient.  He still feels relatively weak and deconditioned but improved from previous. No fevers. Still baseline shortness of breath on oxygen. Not worse than normal. Lower extremity edema has been relatively well controlled recently in spite of being off diuretics given his increase in creatinine. No dysuria now. No chest pain. He is going to have routine cardiac follow-up in the near future.  He needed refill on hydralazine and pain meds.  He still has significant back and leg pain and uses oxycodone as needed. No adverse effect on medication. He is judicious with use.  PMH and SH reviewed  ROS: Per HPI unless specifically indicated in ROS section   Meds, vitals, and allergies reviewed.   GEN: nad, alert and oriented, on O2 at baseline HEENT: mucous membranes moist NECK: supple w/o LA CV: rrr.  PULM: ctab, no inc wob ABD: soft, +bs EXT: 1+ BLE edema SKIN: no acute rash

## 2017-01-23 ENCOUNTER — Other Ambulatory Visit: Payer: Self-pay | Admitting: *Deleted

## 2017-01-23 ENCOUNTER — Other Ambulatory Visit: Payer: Self-pay | Admitting: Family Medicine

## 2017-01-23 DIAGNOSIS — I10 Essential (primary) hypertension: Secondary | ICD-10-CM

## 2017-01-23 LAB — CBC WITH DIFFERENTIAL/PLATELET
Basophils Absolute: 0.1 10*3/uL (ref 0.0–0.1)
Basophils Relative: 1.2 % (ref 0.0–3.0)
Eosinophils Absolute: 0.3 10*3/uL (ref 0.0–0.7)
Eosinophils Relative: 2.7 % (ref 0.0–5.0)
HCT: 36.5 % — ABNORMAL LOW (ref 39.0–52.0)
Hemoglobin: 11.8 g/dL — ABNORMAL LOW (ref 13.0–17.0)
Lymphocytes Relative: 11.4 % — ABNORMAL LOW (ref 12.0–46.0)
Lymphs Abs: 1.2 10*3/uL (ref 0.7–4.0)
MCHC: 32.3 g/dL (ref 30.0–36.0)
MCV: 88.5 fl (ref 78.0–100.0)
Monocytes Absolute: 1.1 10*3/uL — ABNORMAL HIGH (ref 0.1–1.0)
Monocytes Relative: 10.2 % (ref 3.0–12.0)
Neutro Abs: 8.1 10*3/uL — ABNORMAL HIGH (ref 1.4–7.7)
Neutrophils Relative %: 74.5 % (ref 43.0–77.0)
Platelets: 344 10*3/uL (ref 150.0–400.0)
RBC: 4.12 Mil/uL — ABNORMAL LOW (ref 4.22–5.81)
RDW: 15.3 % (ref 11.5–15.5)
WBC: 10.9 10*3/uL — ABNORMAL HIGH (ref 4.0–10.5)

## 2017-01-23 LAB — BASIC METABOLIC PANEL
BUN: 23 mg/dL (ref 6–23)
CO2: 26 mEq/L (ref 19–32)
Calcium: 8.4 mg/dL (ref 8.4–10.5)
Chloride: 104 mEq/L (ref 96–112)
Creatinine, Ser: 1.6 mg/dL — ABNORMAL HIGH (ref 0.40–1.50)
GFR: 45.79 mL/min — ABNORMAL LOW (ref >60.00)
Glucose, Bld: 306 mg/dL — ABNORMAL HIGH (ref 70–99)
Potassium: 4.8 mEq/L (ref 3.5–5.1)
Sodium: 137 mEq/L (ref 135–145)

## 2017-01-23 MED ORDER — TORSEMIDE 20 MG PO TABS: 40.0000 mg | ORAL_TABLET | Freq: Every day | ORAL | 0 refills | Status: DC

## 2017-01-23 MED ORDER — TORSEMIDE 20 MG PO TABS: 40.0000 mg | ORAL_TABLET | Freq: Every day | ORAL | Status: DC

## 2017-01-24 NOTE — Assessment & Plan Note (Signed)
Dysuria is clearly improved. Still on antibiotics. Would finished course of Keflex that he has. Reasonable to recheck urine culture several days after he finishes the antibiotics, given the severity of his recent UTI. Rationale discussed with patient. Recheck creatinine, likely will restart torsemide given the yard he has some fluid reaccumulation in his legs off that medicine. See notes on labs. No chest pain. Okay for outpatient follow-up with cardiology. Rationale for current medications discussed with patient. Refills done for hydralazine and oxycodone, as he was running out of both. See above. >25 minutes spent in face to face time with patient, >50% spent in counselling or coordination of care.

## 2017-01-25 ENCOUNTER — Other Ambulatory Visit: Payer: Self-pay | Admitting: *Deleted

## 2017-01-25 NOTE — Patient Outreach (Signed)
Received a call from pt's spouse [name], reports saw that  RN CM was trying to call.   RN CM requested to speak with pt  who provided HIPAA identifiers.  Pt reports on recent visit with Dr. Ailene Rud done/took last infection (antibiotic) today, told to wait 4 days and provide a urine sample.  Pt reports doing good, weighing daily/recording/swelling down/weight today 260 lbs- staying there, no complaints of chest pain, still sob walking, continues on 4 Liters [place].   Pt reports sugar yesterday was 105,yesterday 133.  Pt reports  Appointment with Lung MD was not made yet, confirmed to follow up at HF clinic 01/31/17.   RN CM requested permission to speak with spouse- follow up on reported [facility] general discharge call- one red flag yesterday to which pt gave permission.   Spoke with spouse, discussed following  up on reported [facility] general discharge call yesterday where she reported on  01/23/17 has other concerns about pt- pt lost interest in things/[facility] information to be sent.   Spouse [name] reports she answered no,they  misunderstood my answer,that  pt is happy to be home, glad when get energy back (recent MI).    Plan:  As discussed with spouse, plan to follow up again with pt next week- initial home visit.    Zara Chess.   [facility]  [phone]

## 2017-01-25 NOTE — Patient Outreach (Signed)
Unsuccessful telephone encounter to pt as  attempt made to follow up on in basket received from  [facility] care management assistant yesterday =  Reported Emmi red flag yesterday obtained during 01/23/17 general discharge call - caregiver reached/ answered yes  on other concerns/ pt lost interest in things.  Unable to leave a voice message as voice message not set up.     Plan:  RN CM to follow up again tomorrow telephonically- Transition of care call scheduled.    Zara Chess.   [facility]  [phone]

## 2017-01-26 ENCOUNTER — Ambulatory Visit: Payer: Self-pay | Admitting: *Deleted

## 2017-01-26 ENCOUNTER — Other Ambulatory Visit: Payer: Self-pay

## 2017-01-26 MED ORDER — ATORVASTATIN CALCIUM 80 MG PO TABS: 80.0000 mg | ORAL_TABLET | Freq: Every day | ORAL | 0 refills | Status: DC

## 2017-01-26 NOTE — Telephone Encounter (Signed)
Pt left v/m; request # 30 of atorvastatin to [facility] [place]. Refilled per protocol. pts wife voiced understanding.

## 2017-01-29 ENCOUNTER — Telehealth: Payer: Self-pay | Admitting: *Deleted

## 2017-01-29 MED ORDER — CEPHALEXIN 500 MG PO CAPS: 500.0000 mg | ORAL_CAPSULE | Freq: Two times a day (BID) | ORAL | 0 refills | Status: AC

## 2017-01-29 NOTE — Telephone Encounter (Signed)
Restart keflex in the meantime, rx sent.  If he can get the ucx done prior to restart, then proceed.  If not, then just start the abx and update me in 1-2 days, sooner if needed.  Thanks.

## 2017-01-29 NOTE — Telephone Encounter (Signed)
Patient notified as instructed by telephone and verbalized understanding. Patient stated that he will drop off a specimen in the morning and then will start the new medication.

## 2017-01-29 NOTE — Telephone Encounter (Signed)
Patient's wife Stephen [name] left a voicemail stating that patient is having pain in lower back and burning with urination.  Stephen [name] stated that patient is suppose to come in tomorrow to leave another urine specimen. Patient's wife wants to know if he should come today and start back on medication for this now?

## 2017-01-30 ENCOUNTER — Encounter: Payer: Self-pay | Admitting: Physician Assistant

## 2017-01-30 ENCOUNTER — Other Ambulatory Visit (INDEPENDENT_AMBULATORY_CARE_PROVIDER_SITE_OTHER): Payer: Medicare HMO

## 2017-01-30 DIAGNOSIS — R319 Hematuria, unspecified: Secondary | ICD-10-CM

## 2017-01-30 DIAGNOSIS — I10 Essential (primary) hypertension: Secondary | ICD-10-CM

## 2017-01-30 DIAGNOSIS — N390 Urinary tract infection, site not specified: Secondary | ICD-10-CM

## 2017-01-30 LAB — BASIC METABOLIC PANEL
BUN: 24 mg/dL — ABNORMAL HIGH (ref 6–23)
CO2: 31 mEq/L (ref 19–32)
Calcium: 8.6 mg/dL (ref 8.4–10.5)
Chloride: 100 mEq/L (ref 96–112)
Creatinine, Ser: 1.73 mg/dL — ABNORMAL HIGH (ref 0.40–1.50)
GFR: 41.84 mL/min — ABNORMAL LOW (ref >60.00)
Glucose, Bld: 159 mg/dL — ABNORMAL HIGH (ref 70–99)
Potassium: 3.5 mEq/L (ref 3.5–5.1)
Sodium: 138 mEq/L (ref 135–145)

## 2017-01-30 NOTE — Telephone Encounter (Signed)
Noted. Thanks.

## 2017-01-31 ENCOUNTER — Encounter: Payer: Self-pay | Admitting: Family

## 2017-01-31 ENCOUNTER — Ambulatory Visit: Payer: Medicare HMO | Attending: Family | Admitting: Family

## 2017-01-31 ENCOUNTER — Telehealth: Payer: Self-pay | Admitting: Urology

## 2017-01-31 VITALS — BP 128/57 | HR 59 | Resp 20 | Ht 65.0 in | Wt 256.1 lb

## 2017-01-31 DIAGNOSIS — I130 Hypertensive heart and chronic kidney disease with heart failure and stage 1 through stage 4 chronic kidney disease, or unspecified chronic kidney disease: Secondary | ICD-10-CM | POA: Diagnosis not present

## 2017-01-31 DIAGNOSIS — E1140 Type 2 diabetes mellitus with diabetic neuropathy, unspecified: Secondary | ICD-10-CM | POA: Diagnosis not present

## 2017-01-31 DIAGNOSIS — Z7982 Long term (current) use of aspirin: Secondary | ICD-10-CM | POA: Diagnosis not present

## 2017-01-31 DIAGNOSIS — E1122 Type 2 diabetes mellitus with diabetic chronic kidney disease: Secondary | ICD-10-CM | POA: Diagnosis not present

## 2017-01-31 DIAGNOSIS — Z7902 Long term (current) use of antithrombotics/antiplatelets: Secondary | ICD-10-CM | POA: Diagnosis not present

## 2017-01-31 DIAGNOSIS — I10 Essential (primary) hypertension: Secondary | ICD-10-CM

## 2017-01-31 DIAGNOSIS — Z794 Long term (current) use of insulin: Secondary | ICD-10-CM | POA: Diagnosis not present

## 2017-01-31 DIAGNOSIS — E785 Hyperlipidemia, unspecified: Secondary | ICD-10-CM | POA: Insufficient documentation

## 2017-01-31 DIAGNOSIS — I5032 Chronic diastolic (congestive) heart failure: Secondary | ICD-10-CM | POA: Insufficient documentation

## 2017-01-31 DIAGNOSIS — Z9981 Dependence on supplemental oxygen: Secondary | ICD-10-CM | POA: Insufficient documentation

## 2017-01-31 DIAGNOSIS — G4733 Obstructive sleep apnea (adult) (pediatric): Secondary | ICD-10-CM | POA: Insufficient documentation

## 2017-01-31 DIAGNOSIS — K219 Gastro-esophageal reflux disease without esophagitis: Secondary | ICD-10-CM | POA: Diagnosis not present

## 2017-01-31 DIAGNOSIS — Z79899 Other long term (current) drug therapy: Secondary | ICD-10-CM | POA: Diagnosis not present

## 2017-01-31 DIAGNOSIS — J8410 Pulmonary fibrosis, unspecified: Secondary | ICD-10-CM | POA: Diagnosis not present

## 2017-01-31 DIAGNOSIS — R300 Dysuria: Secondary | ICD-10-CM

## 2017-01-31 DIAGNOSIS — I252 Old myocardial infarction: Secondary | ICD-10-CM | POA: Insufficient documentation

## 2017-01-31 DIAGNOSIS — I2510 Atherosclerotic heart disease of native coronary artery without angina pectoris: Secondary | ICD-10-CM | POA: Insufficient documentation

## 2017-01-31 DIAGNOSIS — M1990 Unspecified osteoarthritis, unspecified site: Secondary | ICD-10-CM | POA: Diagnosis not present

## 2017-01-31 DIAGNOSIS — N183 Chronic kidney disease, stage 3 (moderate): Secondary | ICD-10-CM | POA: Insufficient documentation

## 2017-01-31 LAB — URINE CULTURE

## 2017-01-31 NOTE — Telephone Encounter (Signed)
Patient's wife called to cancel his app for 02-02-17 because he just got out of the hospital and said he was to sick to come in. She said she would call back to reschedule it once he was feeling better.  Sharyn Lull

## 2017-01-31 NOTE — Progress Notes (Signed)
Cardiology Office Note Date:  02/01/2017  Patient ID:  Stephen [name], [place] Oct 18, 1945, MRN [id number] PCP:  [name], MD  Cardiologist:  Dr. Rockey Situ, MD    Chief Complaint: Hospital follow up  History of Present Illness: Stephen [name] is a 71 y.o. male with history of CAD s/p prior MI and PCI/stenting to the mid LCx and mid RCA in 04/2006 s/p recent NSTEMI in mid July 2018 with medical management, chronic diastolic CHF/systolic dysfunction, chronic respiratory failure on home oxygen at 2 L baseline secondary to interstitial fibrosis s/p wedge resection, CKD stage III, HTN, HLD, DM2, and remote tobacco abuse who presents for hospital follow up after recent admission to [facility] x 2 in mid July.   Prior ischemic evaluation: Dobutamine stress test July 2011 that showed no significant ischemia, inadequate heart rate was achieved. Prior right and left heart catheterization January 2011 showed 50% mid LAD disease, 60% at the ostium of the diagonal #2, 30% proximal left circumflex disease, patent stent in the left circumflex, patent stent of the mid RCA, wedge pressure of 16, PA pressure mean 22. Prior echo in 03/2009 showed normal systolic function, DD, normal RV function. Admitted 01/11/17 at [facility] with NSTEMI. Troponin was up trending at that time with last value being 10 that admission. He underwent LHC that showed significant 2-vessel CAD with an occluded stent in the mid LCx with collaterals noted from the LAD and right coronary artery. Patent RCA stent with 70% stenosis distal to the stent in the midsegment and 50% stenosis in the distal segment, as well as moderate LAD disease. Details below. Medical management was advised with consideration of FFR based PCI of the RCA for recurrent symptoms. Echo showed EF ~ 50% with technically difficult study noted. He was diuresed and amlodipine was added for better BP control. Discharged on 7/14. Return admission to [facility] on 7/16 with dysuria. Found to have a  UTI along with acute on CKD. Troponin was checked for unclear reasons (no symptoms suggestive of angina) and found to be 16.35. It was unclear if this was just a continued downward trend from his recent NSTEMI or if this was a new elevation. Nonetheless, he was not having any symptoms concerning for ischemia/angina and no further cardiac workup was advised. His UTI was treated with antibiotics.  Patient comes in doing well today. No chest pain and improved shortness of breath. He remains on 4 L oxygen at home with a prior baseline of 2 L. He feels like he would now be able to begin taper of his oxygen back to his baseline of 2 L. Chronic lower extremity swelling is much improved from prior. He continues to take torsemide 40 mg daily. Recent renal function from 7/31 showed a mostly stable serum creatinine at 1.73 which was much improved from his hospitalization renal function in which she had AKI in the setting of CKD. He is back on Keflex per PCP for possible residual UTI. Repeat urine culture on 7/31 was contaminated. Overall, he feels like he is doing much better.   Past Medical History:  Diagnosis Date  . Adrenal gland anomaly   . Arthritis   . CAD (coronary artery disease)    a. prior MI and PCI/stenting to mLCx & mRCA in 04/2006; b. NSTEMI mid July 2018 with medical management, LHC 7/18: LM 30%, pLAD 30%, mLAD 50%, dLAD 30%, D1-3 min irregs, mLCx 100% previously treated w/ PCI, OM1 min irregs, pRCA 20%, mRCA lesion-1 10%, mRCA lesion-2  70%, dRCA 50%, RPDA min irregs, post atrio 40%   . CKD (chronic kidney disease), stage III   . Colon polyps   . Diabetes mellitus with neuropathy ([place])   . DM2 (diabetes mellitus, type 2) (HCC)    insulin requiring  . Dyslipidemia   . GERD (gastroesophageal reflux disease)   . HTN (hypertension)   . Kidney stones   . Metabolic syndrome   . OSA (obstructive sleep apnea)   . 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 Recurrent UTI   . Renal artery stenosis (HCC)    L sided, stent 07/2013  . Rotator cuff disorder    right  . Skin cancer    head  . Systolic dysfunction    a. TTE 12/2016: EF 50-55%, mod concentric LVH, images inadequate for wall motion assessment, not technically sufficienct to allow for LV dias fxn, calcified mitral annulus   . Thyroid disorder     Past Surgical History:  Procedure Laterality Date  . adrenal adenoma removal  1990-   right  . CARPAL TUNNEL RELEASE     bilateral  . CORONARY ANGIOPLASTY WITH STENT PLACEMENT  2007   x 2  . KNEE SURGERY     right  . LEFT HEART CATH AND CORONARY ANGIOGRAPHY N/A 01/11/2017   Procedure: Left Heart Cath and Coronary Angiography;  Surgeon: Wellington Hampshire, MD;  Location: [facility];  Service: Cardiovascular;  Laterality: N/A;  . LUNG BIOPSY    . RENAL ARTERY STENT  2015   L  . SKIN CANCER EXCISION     back of head    Current Meds  Medication Sig  . ACCU-CHEK SOFTCLIX LANCETS lancets Use as instructed to check blood sugar three times daily and as needed.  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  Marland Kitchen amLODipine (NORVASC) 5 MG tablet Take 1 tablet (5 mg total) by mouth daily.  Marland Kitchen aspirin 81 MG tablet Take 324 mg by mouth daily.   Marland Kitchen atorvastatin (LIPITOR) 80 MG tablet Take 1 tablet (80 mg total) by mouth at bedtime.  . Blood Glucose Monitoring Suppl (ACCU-CHEK AVIVA PLUS) w/Device KIT Use to check blood sugar three times daily and as needed.  Diagnosis: E11.22  Insulin-dependent  . carvedilol (COREG) 25 MG tablet TAKE 1 TABLET TWICE DAILY WITH A MEAL  . cephALEXin (KEFLEX) 500 MG capsule Take 1 capsule (500 mg total) by mouth 2 (two) times daily.  . cloNIDine (CATAPRES) 0.3 MG tablet TAKE 1 TABLET THREE TIMES DAILY  . clopidogrel (PLAVIX) 75 MG tablet Take 1 tablet (75 mg total) by mouth daily.  Marland Kitchen gabapentin (NEURONTIN) 800 MG tablet TAKE 1 TABLET IN THE MORNING  AND TAKE 1 AND 1/2 TABLETS AT  BEDTIME  . glucose blood (ACCU-CHEK AVIVA PLUS) test strip Use as instructed to check blood sugar 3 times daily or as needed.  Diagnosis:  E11.22  Insulin-dependent.  . hydrALAZINE (APRESOLINE) 50 MG tablet Take 1 tablet (50 mg total) by mouth 3 (three) times daily.  . insulin NPH-regular Human (NOVOLIN 70/30) (70-30) 100 UNIT/ML injection INJECT  70 UNITS SUBCUTANEOUSLY EVERY MORNING  AND  50 units at bedtime  . Insulin Syringe-Needle U-100 (INSULIN SYRINGE 1CC/30GX5/16") 30G X 5/16" 1 ML MISC Use as directed to take insulin Dx 250.62  . isosorbide mononitrate (IMDUR) 30 MG 24 hr tablet Take 3 tablets (90 mg total) by mouth 2 (two) times daily.  . nitroGLYCERIN (  NITROSTAT) 0.4 MG SL tablet Place 1 tablet (0.4 mg total) under the tongue every 5 (five) minutes as needed. May repeat x3  . NON FORMULARY Oxygen 2 liters 24/7  . omeprazole (PRILOSEC) 20 MG capsule Take 20 mg by mouth daily.    Marland Kitchen oxyCODONE (ROXICODONE) 15 MG immediate release tablet Take 1 tablet (15 mg total) by mouth every evening.  . torsemide (DEMADEX) 20 MG tablet Take 2 tablets (40 mg total) by mouth daily.    Allergies:   Calcium channel blockers   Social History:  The patient  reports that he has never smoked. He has never used smokeless tobacco. He reports that he does not drink alcohol or use drugs.   Family History:  The patient's family history includes Arthritis in his maternal grandmother; Breast cancer in his maternal aunt; COPD in his sister; Diabetes in his maternal aunt; Heart attack in his brother, father, and mother; Hypertension in his maternal aunt and other.  ROS:   Review of Systems  Constitutional: Positive for malaise/fatigue. Negative for chills, diaphoresis, fever and weight loss.  HENT: Negative for congestion.   Eyes: Negative for discharge and redness.  Respiratory: Negative for cough, hemoptysis, sputum production, shortness of breath and wheezing.   Cardiovascular: Positive for leg swelling.  Negative for chest pain, palpitations, orthopnea, claudication and PND.       Improve lower extremity swelling.  Gastrointestinal: Negative for abdominal pain, blood in stool, heartburn, melena, nausea and vomiting.  Genitourinary: Negative for hematuria.  Musculoskeletal: Negative for falls and myalgias.  Skin: Negative for rash.  Neurological: Negative for dizziness, tingling, tremors, sensory change, speech change, focal weakness, loss of consciousness and weakness.  Endo/Heme/Allergies: Does not bruise/bleed easily.  Psychiatric/Behavioral: Negative for substance abuse. The patient is not nervous/anxious.   All other systems reviewed and are negative.    PHYSICAL EXAM:  VS:  BP 128/66 (BP Location: Left Arm, Patient Position: Sitting, Cuff Size: Normal)   Pulse (!) 56   Ht '5\' 5"'$  (1.651 m)   Wt 258 lb 12 oz (117.4 kg)   BMI 43.06 kg/m  BMI: Body mass index is 43.06 kg/m.  Physical Exam  Constitutional: He is oriented to person, place, and time. He appears well-developed and well-nourished.  HENT:  Head: Normocephalic and atraumatic.  Eyes: Right eye exhibits no discharge. Left eye exhibits no discharge.  Neck: Normal range of motion. No JVD present.  Cardiovascular: Normal rate, regular rhythm, S1 normal, S2 normal and normal heart sounds.  Exam reveals no distant heart sounds, no friction rub, no midsystolic click and no opening snap.   No murmur heard. Pulmonary/Chest: Effort normal and breath sounds normal. No respiratory distress. He has no decreased breath sounds. He has no wheezes. He has no rales. He exhibits no tenderness.  Abdominal: Soft. He exhibits no distension. There is no tenderness.  Musculoskeletal: He exhibits edema.  1+ bilateral pitting edema to the mid shins.  Neurological: He is alert and oriented to person, place, and time.  Skin: Skin is warm and dry. No cyanosis. Nails show no clubbing.  Psychiatric: He has a normal mood and affect. His speech is normal  and behavior is normal. Judgment and thought content normal.    EKG:  Was ordered and interpreted by me today. Shows sinus bradycardia, 56 bpm, 1st degree AV block, left anterior fascicular block, prior inferior infarct, nonspecific lateral st/t changes  Recent Labs: 01/15/2017: ALT 68 01/16/2017: Magnesium 1.9 01/22/2017: Hemoglobin 11.8; Platelets 344.0 01/30/2017: BUN 24;  Creatinine, Ser 1.73; Potassium 3.5; Sodium 138  01/12/2017: Cholesterol 141; HDL 22; LDL Cholesterol 87; Total CHOL/HDL Ratio 6.4; Triglycerides 160; VLDL 32   Estimated Creatinine Clearance: 48.5 mL/min (A) (by C-G formula based on SCr of 1.73 mg/dL (H)).   Wt Readings from Last 3 Encounters:  02/01/17 258 lb 12 oz (117.4 kg)  01/31/17 256 lb 2 oz (116.2 kg)  01/22/17 265 lb 8 oz (120.4 kg)     Other studies reviewed: Additional studies/records reviewed today include: summarized above  ASSESSMENT AND PLAN:  1. CAD in native artery: No symptoms concerning for angina at this time. Continue dual antiplatelet therapy with aspirin 81 mg daily and clopidogrel 75 mg daily. Should he have return of chest pain concerning for ischemia in the setting of not being volume overloaded would consider addition of ranolazine versus repeat cardiac catheterization with FFR to evaluate for clinical significance of right coronary artery stenosis. At this time, patient feels well and would not like to pursue further intervention.  2. Systolic dysfunction/chronic diastolic CHF:He does not appear volume overloaded at this time. Or extremity swelling and shortness of breath are both improved. Continue torsemide 40 mg daily at this time. Checking bmet as below to evaluate for stability of renal function on current dose of diuretic. No indication to increase dose at this time. Continue carvedilol and Imdur/hydralazine.   3. HLD: Continue Lipitor.  4. CKD stage III: Renal function from 7/31 showed serum creatinine 1.73 which was mildly elevated  from renal function done on 7/23 noted to be 1.60. However, this is significantly improved from his hospitalization renal function. He remains on torsemide 40 mg daily. I will check a bmet today to evaluate for stability of renal function on current dose of torsemide. Given his shortness of breath and lower extremity swelling is improved, we will hold off on adding an additional torsemide at this time.  5. Chronic respiratory failure in the setting of interstitial fibrosis: Feels like shortness of breath is much improved. Patient will begin a trial of tapering his home oxygen back to 2 L over the next several days. Managed by PCP.  6. HTN: Well-controlled. Continue current medications. 7. Morbid obesity: Weight loss advised.  Disposition: F/u with [facility] in 1 month.   Current medicines are reviewed at length with the patient today.  The patient did not have any concerns regarding medicines.  Melvern Banker PA-C 02/01/2017 9:49 AM     [address] [place] [address] [place] [place], [place] [address] [phone]

## 2017-01-31 NOTE — Patient Instructions (Signed)
Continue weighing daily and call for an overnight weight gain of > 2 pounds or a weekly weight gain of >5 pounds. 

## 2017-01-31 NOTE — Progress Notes (Signed)
Patient ID: Stephen [name], male    DOB: July 31, 1945, 71 y.o.   MRN: [id number]  HPI  Mr Kimberley is a 71 y/o male with a history of arthritis, CAD, DM, hyperlipidemia, GERD, HTN, chronic kidney disease, obstructive sleep apnea (CPAP), pulmonary fibrosis (chronic oxygen) and chronic heart failure.   Echo done 01/11/17 shows an EF of 50-55%. Cardiac catheterization done 01/11/17 shows significant 2 vessel CAD with occluded stent in the mid left circumflex with collaterals noted from the LAD and RCA. Patent RCA stent with 70% stenosis. Due to collaterals, optimizing medical therapy was recommended.  Admitted 01/15/17 due to ecoli sepsis due to UTI. Initially needed IV antibiotics and transitioned to oral antibiotics. Developed acute on chronic renal failure with improvement after holding lasix. Cardiology consult obtained. Discharged home after 3 days. Admitted 01/11/17 due to NSTEMI. Cardiology consult obtained. Catheterization done. Treated with heparin drip, aspirin, plavix and lipitor. Medications adjusted for HTN. Discharged home after 2 days.   He presents today for his initial visit with a chief complaint of mild fatigue with moderate exertion. He describes this as having been present for years with improvement since hospital discharge. He has associated edema and light-headedness along with this. Does wear oxygen at 2L around the clock. Wearing CPAP nightly.  Past Medical History:  Diagnosis Date  . Adrenal gland anomaly   . Arthritis   . CAD (coronary artery disease)    a. prior MI and PCI/stenting to mLCx & mRCA in 04/2006; b. NSTEMI mid July 2018 with medical management, LHC 7/18: LM 30%, pLAD 30%, mLAD 50%, dLAD 30%, D1-3 min irregs, mLCx 100% previously treated w/ PCI, OM1 min irregs, pRCA 20%, mRCA lesion-1 10%, mRCA lesion-2 70%, dRCA 50%, RPDA min irregs, post atrio 40%   . CKD (chronic kidney disease), stage III   . Colon polyps   . Diabetes mellitus with neuropathy ([place])   . DM2  (diabetes mellitus, type 2) (HCC)    insulin requiring  . Dyslipidemia   . GERD (gastroesophageal reflux disease)   . HTN (hypertension)   . Kidney stones   . Metabolic syndrome   . OSA (obstructive sleep apnea)   . 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 Recurrent UTI   . Renal artery stenosis (HCC)    L sided, stent 07/2013  . Rotator cuff disorder    right  . Skin cancer    head  . Systolic dysfunction    a. TTE 12/2016: EF 50-55%, mod concentric LVH, images inadequate for wall motion assessment, not technically sufficienct to allow for LV dias fxn, calcified mitral annulus   . Thyroid disorder    Past Surgical History:  Procedure Laterality Date  . adrenal adenoma removal  1990-   right  . CARPAL TUNNEL RELEASE     bilateral  . CORONARY ANGIOPLASTY WITH STENT PLACEMENT  2007   x 2  . KNEE SURGERY     right  . LEFT HEART CATH AND CORONARY ANGIOGRAPHY N/A 01/11/2017   Procedure: Left Heart Cath and Coronary Angiography;  Surgeon: Wellington Hampshire, MD;  Location: [facility];  Service: Cardiovascular;  Laterality: N/A;  . LUNG BIOPSY    . RENAL ARTERY STENT  2015   L  . SKIN CANCER EXCISION     back of head   Family History  Problem Relation Age of Onset  . Heart attack Father   .  Heart attack Mother   . Heart attack Brother   . COPD Sister   . Arthritis Maternal Grandmother   . Breast cancer Maternal Aunt   . Diabetes Maternal Aunt   . Hypertension Other   . Hypertension Maternal Aunt    Social History  Substance Use Topics  . Smoking status: Never Smoker  . Smokeless tobacco: Never Used  . Alcohol use No   Allergies  Allergen Reactions  . Calcium Channel Blockers     Would avoid if possible due to h/o peripheral edema   Prior to Admission medications   Medication Sig Start Date End Date Taking? Authorizing Provider  ACCU-CHEK SOFTCLIX LANCETS lancets Use as instructed  to check blood sugar three times daily and as needed.  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 03/09/16  Yes [name], MD  amLODipine (NORVASC) 5 MG tablet Take 1 tablet (5 mg total) by mouth daily. 01/14/17  Yes Demetrios Loll, MD  aspirin 81 MG tablet Take 324 mg by mouth daily.    Yes [provider]  atorvastatin (LIPITOR) 80 MG tablet Take 1 tablet (80 mg total) by mouth at bedtime. 01/26/17  Yes [name], MD  Blood Glucose Monitoring Suppl (ACCU-CHEK AVIVA PLUS) w/Device KIT Use to check blood sugar three times daily and as needed.  Diagnosis: E11.22  Insulin-dependent 03/02/16  Yes [name], MD  carvedilol (COREG) 25 MG tablet TAKE 1 TABLET TWICE DAILY WITH A MEAL 02/03/16  Yes [name], MD  cephALEXin (KEFLEX) 500 MG capsule Take 1 capsule (500 mg total) by mouth 2 (two) times daily. 01/29/17 02/05/17 Yes [name], MD  cloNIDine (CATAPRES) 0.3 MG tablet TAKE 1 TABLET THREE TIMES DAILY 10/27/15  Yes Gollan, Kathlene November, MD  clopidogrel (PLAVIX) 75 MG tablet Take 1 tablet (75 mg total) by mouth daily. 08/07/16  Yes Minna Merritts, MD  gabapentin (NEURONTIN) 800 MG tablet TAKE 1 TABLET IN THE MORNING  AND TAKE 1 AND 1/2 TABLETS AT BEDTIME 10/25/16  Yes [name], MD  glucose blood (ACCU-CHEK AVIVA PLUS) test strip Use as instructed to check blood sugar 3 times daily or as needed.  Diagnosis:  E11.22  Insulin-dependent. 03/02/16  Yes [name], MD  hydrALAZINE (APRESOLINE) 50 MG tablet Take 1 tablet (50 mg total) by mouth 3 (three) times daily. 01/22/17  Yes [name], MD  insulin NPH-regular Human (NOVOLIN 70/30) (70-30) 100 UNIT/ML injection INJECT  70 UNITS SUBCUTANEOUSLY EVERY MORNING  AND  50 units at bedtime 01/22/17  Yes [name], MD  Insulin Syringe-Needle U-100 (INSULIN SYRINGE 1CC/30GX5/16") 30G X 5/16" 1 ML MISC Use as directed to take insulin Dx 250.62 07/14/13  Yes Ria Bush, MD  isosorbide mononitrate (IMDUR) 30 MG 24 hr  tablet Take 3 tablets (90 mg total) by mouth 2 (two) times daily. 01/13/17  Yes Demetrios Loll, MD  nitroGLYCERIN (NITROSTAT) 0.4 MG SL tablet Place 1 tablet (0.4 mg total) under the tongue every 5 (five) minutes as needed. May repeat x3 01/18/17  Yes [name], MD  NON FORMULARY Oxygen 2 liters 24/7   Yes [provider]  omeprazole (PRILOSEC) 20 MG capsule Take 20 mg by mouth daily.     Yes [provider]  oxyCODONE (ROXICODONE) 15 MG immediate release tablet Take 1 tablet (15 mg total) by mouth every evening. 01/22/17  Yes [name], MD  torsemide (DEMADEX) 20 MG tablet Take 2 tablets (40 mg total) by  mouth daily. 01/23/17  Yes [name], MD    Review of Systems  Constitutional: Positive for fatigue. Negative for appetite change.  HENT: Negative for congestion, rhinorrhea and sore throat.   Eyes: Negative.   Respiratory: Negative for chest tightness, shortness of breath and wheezing.   Cardiovascular: Positive for leg swelling. Negative for chest pain and palpitations.  Gastrointestinal: Negative for abdominal distention and abdominal pain.  Endocrine: Negative.   Genitourinary: Negative.   Musculoskeletal: Negative for back pain and neck pain.  Skin: Negative.   Allergic/Immunologic: Negative.   Neurological: Positive for light-headedness. Negative for dizziness.  Hematological: Negative for adenopathy. Does not bruise/bleed easily.  Psychiatric/Behavioral: Negative for dysphoric mood and sleep disturbance (sleeping on 1 pillow; wearing oxygent at 2L around the clock along with CPAP). The patient is not nervous/anxious.    Vitals:   01/31/17 1127  BP: (!) 128/57  Pulse: (!) 59  Resp: 20  SpO2: 97%  Weight: 256 lb 2 oz (116.2 kg)  Height: '5\' 5"'$  (1.651 m)   Wt Readings from Last 3 Encounters:  01/31/17 256 lb 2 oz (116.2 kg)  01/22/17 265 lb 8 oz (120.4 kg)  01/18/17 260 lb 9.6 oz (118.2 kg)   Lab Results  Component Value Date   CREATININE  1.73 (H) 01/30/2017   CREATININE 1.60 (H) 01/22/2017   CREATININE 2.13 (H) 01/18/2017    Physical Exam  Constitutional: He is oriented to person, place, and time. He appears well-developed and well-nourished.  HENT:  Head: Normocephalic and atraumatic.  Neck: Normal range of motion. Neck supple. No JVD present.  Cardiovascular: Regular rhythm.  Bradycardia present.   Pulmonary/Chest: Effort normal. He has no wheezes. He has no rales.  Abdominal: Soft. He exhibits no distension. There is no tenderness.  Musculoskeletal: He exhibits edema (trace edema in bilateral lower legs). He exhibits no tenderness.  Neurological: He is alert and oriented to person, place, and time.  Skin: Skin is warm and dry.  Psychiatric: He has a normal mood and affect. His behavior is normal. Thought content normal.  Nursing note and vitals reviewed.  Assessment and Plan:  1: Chronic heart failure with preserved ejection fraction- - NYHA class II - euvolemic today - weighing daily and he was instructed to call for an overnight weight gain of >2 pounds or a weekly weight gain of >5 pounds - not adding salt and wife has been reading food labels. Discussed the importance of closely following a '2000mg'$  sodium diet and written dietary information was given to them about this - wearing support socks and elevating legs on occasion - is trying to increase his activity but is unsure of how much he can do. May benefit from [facility]/cardiac rehab - discussed pacing himself, stopping when he experiences symptoms and see how he feels the next day. If he's wore out the next day, then he needs to look back as that means he did too much the day before.  - wants to start mowing his yard again on the riding mower. Advised him to do it in pieces and not to be out there in the heat of the day - wears oxygen at 2L around the clock - does not meet ReDS criteria due to BMI - sees cardiologist Rockey Situ) 02/01/17  2: HTN- - BP looks  good today - BMP from 01/30/17 shows sodium 139, potassium 3.5 and GFR 41.84 - saw PCP [name]) 01/22/17  3: Diabetes- - glucose at home this morning was 157 - A1c  on 01/11/17 was 7.0%  4: Obstructive sleep apnea- - wears CPAP on a nightly basis - pulmonologist has moved to [place] so he's going to establish care with someone locally  Patient did not bring his medications nor a list. Each medication was verbally reviewed with the patient and he was encouraged to bring the bottles to every visit to confirm accuracy of list.  Return in 1 month or sooner for any questions/problems before then.

## 2017-02-01 ENCOUNTER — Ambulatory Visit (INDEPENDENT_AMBULATORY_CARE_PROVIDER_SITE_OTHER): Payer: Medicare HMO | Admitting: Physician Assistant

## 2017-02-01 ENCOUNTER — Encounter: Payer: Self-pay | Admitting: Physician Assistant

## 2017-02-01 VITALS — BP 128/66 | HR 56 | Ht 65.0 in | Wt 258.8 lb

## 2017-02-01 DIAGNOSIS — I2510 Atherosclerotic heart disease of native coronary artery without angina pectoris: Secondary | ICD-10-CM

## 2017-02-01 DIAGNOSIS — I10 Essential (primary) hypertension: Secondary | ICD-10-CM

## 2017-02-01 DIAGNOSIS — N183 Chronic kidney disease, stage 3 unspecified: Secondary | ICD-10-CM

## 2017-02-01 DIAGNOSIS — I5032 Chronic diastolic (congestive) heart failure: Secondary | ICD-10-CM

## 2017-02-01 DIAGNOSIS — I519 Heart disease, unspecified: Secondary | ICD-10-CM | POA: Diagnosis not present

## 2017-02-01 DIAGNOSIS — I701 Atherosclerosis of renal artery: Secondary | ICD-10-CM

## 2017-02-01 NOTE — Patient Instructions (Signed)
Medication Instructions:  Your physician recommends that you continue on your current medications as directed. Please refer to the Current Medication list given to you today.   Labwork: Your physician recommends that you return for lab work in: TODAY (BMP).   Testing/Procedures: none  Follow-Up: Your physician recommends that you schedule a follow-up appointment in: [place].  If you need a refill on your cardiac medications before your next appointment, please call your pharmacy.

## 2017-02-02 ENCOUNTER — Other Ambulatory Visit: Payer: Self-pay | Admitting: *Deleted

## 2017-02-02 ENCOUNTER — Other Ambulatory Visit: Payer: Self-pay | Admitting: Cardiovascular Disease

## 2017-02-02 ENCOUNTER — Other Ambulatory Visit: Payer: Self-pay | Admitting: Family Medicine

## 2017-02-02 ENCOUNTER — Encounter: Payer: Self-pay | Admitting: *Deleted

## 2017-02-02 ENCOUNTER — Ambulatory Visit: Payer: Medicare HMO | Admitting: Urology

## 2017-02-02 LAB — BASIC METABOLIC PANEL
BUN/Creatinine Ratio: 13 (ref 10–24)
BUN: 20 mg/dL (ref 8–27)
CO2: 25 mmol/L (ref 20–29)
Calcium: 8.8 mg/dL (ref 8.6–10.2)
Chloride: 100 mmol/L (ref 96–106)
Creatinine, Ser: 1.6 mg/dL — ABNORMAL HIGH (ref 0.76–1.27)
GFR calc Af Amer: 50 mL/min/{1.73_m2} — ABNORMAL LOW (ref >59)
GFR calc non Af Amer: 44 mL/min/{1.73_m2} — ABNORMAL LOW (ref >59)
Glucose: 210 mg/dL — ABNORMAL HIGH (ref 65–99)
Potassium: 3.9 mmol/L (ref 3.5–5.2)
Sodium: 138 mmol/L (ref 134–144)

## 2017-02-02 NOTE — Patient Outreach (Signed)
[facility]) Care Management   02/02/2017  SAKET HELLSTROM May 06, 1946 [id number]  Stephen [name] is an 71 y.o. male  Subjective:  Pt reports on recent  visit at HF clinic, good report.  Pt reports weighing daily, Recording, no complaints of sob, chest pain, on continuous 2 liters [place],walking 4 days a week, knows when to call MD for weight gain.      Objective:   Vitals:   02/02/17 1206  BP: 102/60  Pulse: (!) 57  Resp: 16    ROS  Physical Exam  Constitutional: He is oriented to person, place, and time. He appears well-developed and well-nourished.  Cardiovascular: Normal rate and regular rhythm.   Respiratory: Effort normal.  Diminished lung sounds- anterior upper lobes, other fields   Clear.   GI: Soft.  Musculoskeletal: Normal range of motion. He exhibits no edema.  Neurological: He is alert and oriented to person, place, and time.  Skin: Skin is warm and dry.  Psychiatric: He has a normal mood and affect. His behavior is normal. Judgment and thought content normal.    Encounter Medications:   Outpatient Encounter Prescriptions as of 02/02/2017  Medication Sig Note  . ACCU-CHEK SOFTCLIX LANCETS lancets Use as instructed to check blood sugar three times daily and as needed.  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   Marland Kitchen amLODipine (NORVASC) 5 MG tablet Take 1 tablet (5 mg total) by mouth daily.   Marland Kitchen aspirin 81 MG tablet Take 324 mg by mouth daily.  01/12/2017: Pt states he takes 4 tabs daily  . atorvastatin (LIPITOR) 80 MG tablet Take 1 tablet (80 mg total) by mouth at bedtime.   . Blood Glucose Monitoring Suppl (ACCU-CHEK AVIVA PLUS) w/Device KIT Use to check blood sugar three times daily and as needed.  Diagnosis: E11.22  Insulin-dependent   . carvedilol (COREG) 25 MG tablet TAKE 1 TABLET TWICE DAILY WITH A MEAL   . cephALEXin (KEFLEX) 500 MG capsule Take 1 capsule (500 mg total) by mouth 2 (two) times daily.   . cloNIDine (CATAPRES) 0.3 MG tablet TAKE 1 TABLET  THREE TIMES DAILY   . clopidogrel (PLAVIX) 75 MG tablet Take 1 tablet (75 mg total) by mouth daily.   Marland Kitchen gabapentin (NEURONTIN) 800 MG tablet TAKE 1 TABLET IN THE MORNING  AND TAKE 1 AND 1/2 TABLETS AT BEDTIME   . glucose blood (ACCU-CHEK AVIVA PLUS) test strip Use as instructed to check blood sugar 3 times daily or as needed.  Diagnosis:  E11.22  Insulin-dependent.   . hydrALAZINE (APRESOLINE) 50 MG tablet Take 1 tablet (50 mg total) by mouth 3 (three) times daily.   . insulin NPH-regular Human (NOVOLIN 70/30) (70-30) 100 UNIT/ML injection INJECT  70 UNITS SUBCUTANEOUSLY EVERY MORNING  AND  50 units at bedtime   . Insulin Syringe-Needle U-100 (INSULIN SYRINGE 1CC/30GX5/16") 30G X 5/16" 1 ML MISC Use as directed to take insulin Dx 250.62   . isosorbide mononitrate (IMDUR) 30 MG 24 hr tablet Take 3 tablets (90 mg total) by mouth 2 (two) times daily.   . nitroGLYCERIN (NITROSTAT) 0.4 MG SL tablet Place 1 tablet (0.4 mg total) under the tongue every 5 (five) minutes as needed. May repeat x3   . NON FORMULARY Oxygen 2 liters 24/7 01/19/2017: Per spouse pt using 4 Liters of Oxygen.   Marland Kitchen omeprazole (PRILOSEC) 20 MG capsule Take 20 mg by mouth daily.     Marland Kitchen oxyCODONE (ROXICODONE) 15 MG immediate release tablet Take 1 tablet (15 mg  total) by mouth every evening.   . torsemide (DEMADEX) 20 MG tablet Take 2 tablets (40 mg total) by mouth daily.    No facility-administered encounter medications on file as of 02/02/2017.     Functional Status:   In your present state of health, do you have any difficulty performing the following activities: 02/02/2017 01/31/2017  Hearing? N N  Vision? N N  Difficulty concentrating or making decisions? N N  Walking or climbing stairs? N N  Dressing or bathing? N N  Doing errands, shopping? Y N  Preparing Food and eating ? N -  Using the Toilet? N -  In the past six months, have you accidently leaked urine? N -  Do you have problems with loss of bowel control? N -  Managing  your Medications? N -  Managing your Finances? N -  Housekeeping or managing your Housekeeping? Y -  Some recent data might be hidden    Fall/Depression Screening:    Fall Risk  02/02/2017 01/31/2017 08/31/2016  Falls in the past year? Yes Yes No  Number falls in past yr: 1 1 -  Injury with Fall? No No -  Follow up - Falls prevention discussed -   PHQ 2/9 Scores 02/02/2017 01/31/2017 08/31/2016  PHQ - 2 Score 0 0 0    Assessment:  Pleasant 71 year old male, resides with spouse,very supportive.       HF:  Review of pt's recorded weights- today 252 lbs. Down from 261 on 01/22/17, no edema/sob.             Lung sounds diminished anterior upper lobes, clear in all other fields.      CAD:  No complaints of chest pain, nitroglycerin SL available if needed.  Emmi information -  Heart attack recovery provided, reviewed with pt/spouse.      DM:  Review of pt's recorded sugars, today 117, ranges 70-177.                 Plan:  As discussed with pt, plan to continue to follow for transition of care, follow up again            Next week telephonically.            Plan to send Dr. Para March by in basket - 02/02/17 home visit encounter.    [facility] CM Care Plan Problem One     Most Recent Value  Care Plan Problem One  Risk for readmission related to recent hospitalization for UTI, Cystitis, second admit <30 days   Role Documenting the Problem One  Care Management Coordinator  Care Plan for Problem One  Active  [facility] Long Term Goal   Pt would not readmit to the hospital within the next 31 days   [facility] Long Term Goal Start Date  01/19/17  Interventions for Problem One Long Term Goal  Provided pt with Emmi - Heart attack recovery, reviewed with pt.   [facility] CM Short Term Goal #1   Pt would keep all MD appointments in the next 30 days   [facility] CM Short Term Goal #1 Start Date  01/19/17  Interventions for Short Term Goal #1  Discussed with pt recent visit at HF clinic   [facility] CM Short Term Goal #2   Pt would start weighing in am,  same time,record for the next 30 days   [facility] CM Short Term Goal #2 Start Date  01/19/17  Interventions for Short Term Goal #2  Reviewed pt's  daily recorded weights, loosing weight.      Zara Chess.   [place] [facility]  [phone]

## 2017-02-05 DIAGNOSIS — J449 Chronic obstructive pulmonary disease, unspecified: Secondary | ICD-10-CM | POA: Diagnosis not present

## 2017-02-08 ENCOUNTER — Telehealth: Payer: Self-pay | Admitting: Family Medicine

## 2017-02-08 NOTE — Telephone Encounter (Signed)
Caller Name:Stephen [name]   Relationship to Patient: self  Best [phone] Pharmacy:  Reason for call: pt finished abx and would like to know if you need another urine sample and bloodwork.

## 2017-02-08 NOTE — Telephone Encounter (Signed)
Patient advised.

## 2017-02-08 NOTE — Telephone Encounter (Signed)
No labs unless he has urinary symptoms/abd pain/fever now.  Does he have any sx current?  Please let me know.  Thanks.

## 2017-02-09 ENCOUNTER — Telehealth: Payer: Self-pay | Admitting: Cardiovascular Disease

## 2017-02-09 ENCOUNTER — Other Ambulatory Visit: Payer: Self-pay | Admitting: *Deleted

## 2017-02-09 NOTE — Telephone Encounter (Signed)
Patient canceled nm study bc he had a cath prior to testing   Can this be cancelled now ?  Still in [place]

## 2017-02-09 NOTE — Patient Outreach (Signed)
9:28 am- Unsuccessful telephone encounter to Stephen [name], 71 year old gentleman for transition of care, ongoing follow on recent hospitalization July 16-7/19,2018 for UTI, Acute renal insufficiency, elevated Tropinin.   Previous hospitalization July 12-14,2018 for left heart cath, NSTEMI, diabetes with renal complications, CAD, Hypertension, HF.  Unable to leave a voice message as voice message not set up.   9:32 am- received a  Call from pt, saw call was made, HIPAA identifiers provided, RN CM discussed purpose of  Call- ongoing follow up for transition of care.  Pt reports doing good, weights same since RN CM saw him last week, ranges 252-253 lbs, today 253 lbs.  Pt reports no edema,sob, taking all medications as ordered, completed antibiotic. Pt reports sugar yesterday was 195, did not take it today, been running around 155.  Pt reports thinks has  MD appointment  next week.    Plan:  As discussed with pt, plan to follow up again next week telephonically (part of ongoing transition of care).  Zara Chess.   [place] [facility]  [phone]

## 2017-02-15 ENCOUNTER — Other Ambulatory Visit: Payer: Self-pay | Admitting: *Deleted

## 2017-02-15 NOTE — Patient Outreach (Signed)
Successful telephone encounter to Stephen [name], 71 year old male for transition of care,ongoing follow up on recent hospitalization July 16-19, 2018 for UTI,acute renal insufficiency, elevated Troponin.   Previous hospitalization July 12-14,2018 for left heart cath, NSTEMI, Diabetes with renal complications, CAD, Hypertension,HF.  Spoke with pt, HIPAA identifiers verified- pt reports doing good with weight today 251 lbs, last 3-4 mornings 252 lbs, no edema, sob, continues on 2 liters .  Pt reports sugar this am was 137, been under 150.   Pt reports strength coming back, continues to walk.  Pt reports on upcoming MD appointments- one the end of this month and first week in September..   Plan:  As discussed with pt, plan to follow up again next week telephonically for  final transition of care call, then will call again next month check on status.    Zara Chess.   [place] [facility]  [phone]

## 2017-02-16 ENCOUNTER — Telehealth: Payer: Self-pay

## 2017-02-16 DIAGNOSIS — R300 Dysuria: Secondary | ICD-10-CM

## 2017-02-16 MED ORDER — CEPHALEXIN 500 MG PO CAPS: 500.0000 mg | ORAL_CAPSULE | Freq: Two times a day (BID) | ORAL | 0 refills | Status: DC

## 2017-02-16 NOTE — Addendum Note (Signed)
Addended by: Ellamae Sia on: 02/16/2017 04:48 PM   Modules accepted: Orders

## 2017-02-16 NOTE — Telephone Encounter (Signed)
Pt left v/m; pt was recently in hospital with UTI that got into bloodstream and pt started today with burning upon urination and pt request abx to [facility]. Pt does not want to have to go back to hospital. Pt request cb. Pt last seen 01/22/17.

## 2017-02-16 NOTE — Telephone Encounter (Signed)
Patient advised and will try to come by this afternoon to give the urine sample.

## 2017-02-16 NOTE — Telephone Encounter (Signed)
See if he can collect a urine sample before starting the abx.  If possible, then drop off for urine culture.  If he can't, then start the abx in the meantime.  rx sent.  Update me Monday.  If progressive sx in the meantime, then needs to be checked over the weekend.  Thanks.

## 2017-02-19 ENCOUNTER — Telehealth: Payer: Self-pay

## 2017-02-19 ENCOUNTER — Other Ambulatory Visit: Payer: Self-pay | Admitting: *Deleted

## 2017-02-19 ENCOUNTER — Other Ambulatory Visit: Payer: Self-pay | Admitting: Family Medicine

## 2017-02-19 LAB — URINE CULTURE

## 2017-02-19 MED ORDER — SULFAMETHOXAZOLE-TRIMETHOPRIM 800-160 MG PO TABS: 1.0000 | ORAL_TABLET | Freq: Two times a day (BID) | ORAL | 0 refills | Status: DC

## 2017-02-19 NOTE — Telephone Encounter (Signed)
PLEASE NOTE: All timestamps contained within this report are represented as [place] Standard Time. CONFIDENTIALTY NOTICE: This fax transmission is intended only for the addressee. It contains information that is legally privileged, confidential or otherwise protected from use or disclosure. If you are not the intended recipient, you are strictly prohibited from reviewing, disclosing, copying using or disseminating any of this information or taking any action in reliance on or regarding this information. If you have received this fax in error, please notify us immediately by telephone so that we can arrange for its return to [place]. Phone: [phone], Toll-Free: [phone], Fax: [phone] Page: 1 of 1 Call Id: [id number] [place] Patient Name: Stephen [name] Gender: Male DOB: January 02, 1946 Age: 71 Y 45 D Return Phone Number: [phone] (Primary) City/State/Zip: [place] [address] Client Levan Day - Client Client Site [place] Physician Renford Dills - MD Who Is Calling Patient / Member / Family / Caregiver Call Type Triage / Clinical Caller Name Bensen Chadderdon Relationship To Patient Spouse Return Phone Number [phone] (Primary) Chief Complaint Medication Question (non symptomatic) Reason for Call Medication Question / Request Initial Comment Caller states her husband called into the office today for an antibiotic for a UTI. He had one in July and was prescribed Cethalexin TEZA and when he called today he thought he would be prescribed the same medication but this time he got Cethalexin LUP. He just wanted to make sure it was the same. Appointment Disposition EMR Appointment Not Necessary Info pasted into Epic No Nurse Assessment Nurse: Georgina Peer, RN, Kendrick Fries Date/Time Eilene Ghazi Time): 02/16/2017 11:27:21 PM Confirm and document reason  for call. If symptomatic, describe symptoms. ---Caller just wants to know if there is any difference in Cephalexin 500mg  TEVA or Cephalexin 500 mg LUP. I explained that the medication is the same, just different packaging b/c they are from different manufacturers. Please document clinical information provided and list any resource used. ---www.drugs.com Guidelines Guideline Title Affirmed Question Disp. Time Eilene Ghazi Time) Disposition Final User 02/16/2017 11:32:05 PM Clinical Call Yes Georgina Peer, RN, Kendrick Fries

## 2017-02-19 NOTE — Patient Outreach (Signed)
Successful telephone encounter to Zenith Lamphier, 71 year old male for transition of care (final call), ongoing follow up on recent hospitalization July 16-19,2018 for UTI, acute renal insufficiency, elevated Troponin. Pt had another hospitalization July 12-14,2018 for left heart cath, NSTEMI, Diabetes with renal complications, CAD, Hypertension, HF.  Spoke with pt, HIPAA identifiers verified, reports on call to PCP office 02/16/17 for burning when urinating, started on antibiotic that night, feeling better.  Pt reports weights the same (251 lbs), no sob/edema/chest pain, continues with 2 liters [place].   Pt reports taking all of his medications as ordered, appetite good.   RN CM discussed with pt today is  final transition of care call, plan to follow up again next month with ongoing  community CM services.   Plan:  As discussed with pt, plan to follow up again next month- home visit.             Transition of care program ended, care plan updated.   Zara Chess.   [facility] [place] [facility]  [phone]

## 2017-02-22 DIAGNOSIS — J982 Interstitial emphysema: Secondary | ICD-10-CM | POA: Diagnosis not present

## 2017-02-26 ENCOUNTER — Telehealth: Payer: Self-pay | Admitting: *Deleted

## 2017-02-26 DIAGNOSIS — R300 Dysuria: Secondary | ICD-10-CM

## 2017-02-26 MED ORDER — SULFAMETHOXAZOLE-TRIMETHOPRIM 800-160 MG PO TABS: 1.0000 | ORAL_TABLET | Freq: Two times a day (BID) | ORAL | 0 refills | Status: DC

## 2017-02-26 NOTE — Telephone Encounter (Signed)
Patient left a voicemail stating that Dr. [name] has been treating him for a UTI. Patient stated that he took his last pill yesterday and was feeling pretty good this morning. Patient stated that he just went to the bathroom and he is now burning again. Patient wants to know if he needs to come back in?

## 2017-02-26 NOTE — Telephone Encounter (Signed)
Restart the abx, rx sent, recheck ucx- ordered.  Offer OV.  Clearly needs OV if not improving.  Thanks.

## 2017-02-27 ENCOUNTER — Ambulatory Visit: Payer: Medicare HMO | Attending: Family | Admitting: Family

## 2017-02-27 ENCOUNTER — Other Ambulatory Visit: Payer: Medicare HMO

## 2017-02-27 ENCOUNTER — Encounter: Payer: Self-pay | Admitting: Family

## 2017-02-27 VITALS — BP 147/53 | HR 60 | Resp 20 | Ht 65.0 in | Wt 260.1 lb

## 2017-02-27 DIAGNOSIS — G4733 Obstructive sleep apnea (adult) (pediatric): Secondary | ICD-10-CM | POA: Insufficient documentation

## 2017-02-27 DIAGNOSIS — Z8249 Family history of ischemic heart disease and other diseases of the circulatory system: Secondary | ICD-10-CM | POA: Diagnosis not present

## 2017-02-27 DIAGNOSIS — I130 Hypertensive heart and chronic kidney disease with heart failure and stage 1 through stage 4 chronic kidney disease, or unspecified chronic kidney disease: Secondary | ICD-10-CM | POA: Diagnosis not present

## 2017-02-27 DIAGNOSIS — Z87440 Personal history of urinary (tract) infections: Secondary | ICD-10-CM | POA: Diagnosis not present

## 2017-02-27 DIAGNOSIS — I2510 Atherosclerotic heart disease of native coronary artery without angina pectoris: Secondary | ICD-10-CM | POA: Diagnosis not present

## 2017-02-27 DIAGNOSIS — Z794 Long term (current) use of insulin: Secondary | ICD-10-CM | POA: Insufficient documentation

## 2017-02-27 DIAGNOSIS — I509 Heart failure, unspecified: Secondary | ICD-10-CM | POA: Insufficient documentation

## 2017-02-27 DIAGNOSIS — I10 Essential (primary) hypertension: Secondary | ICD-10-CM

## 2017-02-27 DIAGNOSIS — Z7982 Long term (current) use of aspirin: Secondary | ICD-10-CM | POA: Diagnosis not present

## 2017-02-27 DIAGNOSIS — Z86010 Personal history of colonic polyps: Secondary | ICD-10-CM | POA: Diagnosis not present

## 2017-02-27 DIAGNOSIS — I252 Old myocardial infarction: Secondary | ICD-10-CM | POA: Insufficient documentation

## 2017-02-27 DIAGNOSIS — I5032 Chronic diastolic (congestive) heart failure: Secondary | ICD-10-CM

## 2017-02-27 DIAGNOSIS — E1122 Type 2 diabetes mellitus with diabetic chronic kidney disease: Secondary | ICD-10-CM | POA: Diagnosis not present

## 2017-02-27 DIAGNOSIS — Z9889 Other specified postprocedural states: Secondary | ICD-10-CM | POA: Diagnosis not present

## 2017-02-27 DIAGNOSIS — Z803 Family history of malignant neoplasm of breast: Secondary | ICD-10-CM | POA: Insufficient documentation

## 2017-02-27 DIAGNOSIS — N183 Chronic kidney disease, stage 3 (moderate): Secondary | ICD-10-CM | POA: Insufficient documentation

## 2017-02-27 DIAGNOSIS — Z85828 Personal history of other malignant neoplasm of skin: Secondary | ICD-10-CM | POA: Insufficient documentation

## 2017-02-27 DIAGNOSIS — Z79899 Other long term (current) drug therapy: Secondary | ICD-10-CM | POA: Insufficient documentation

## 2017-02-27 DIAGNOSIS — Z833 Family history of diabetes mellitus: Secondary | ICD-10-CM | POA: Diagnosis not present

## 2017-02-27 DIAGNOSIS — Z8261 Family history of arthritis: Secondary | ICD-10-CM | POA: Insufficient documentation

## 2017-02-27 DIAGNOSIS — R300 Dysuria: Secondary | ICD-10-CM | POA: Diagnosis not present

## 2017-02-27 NOTE — Patient Instructions (Signed)
Continue weighing daily and call for an overnight weight gain of > 2 pounds or a weekly weight gain of >5 pounds. 

## 2017-02-27 NOTE — Progress Notes (Signed)
Patient ID: Stephen [name], male    DOB: 08-30-1945, 71 y.o.   MRN: [id number]  HPI  Stephen [name] is a 71 y/o male with a history of arthritis, CAD, DM, hyperlipidemia, GERD, HTN, chronic kidney disease, obstructive sleep apnea (CPAP), pulmonary fibrosis (chronic oxygen) and chronic heart failure.   Echo done 01/11/17 shows an EF of 50-55%. Cardiac catheterization done 01/11/17 shows significant 2 vessel CAD with occluded stent in the mid left circumflex with collaterals noted from the LAD and RCA. Patent RCA stent with 70% stenosis. Due to collaterals, optimizing medical therapy was recommended.  Admitted 01/15/17 due to ecoli sepsis due to UTI. Initially needed IV antibiotics and transitioned to oral antibiotics. Developed acute on chronic renal failure with improvement after holding lasix. Cardiology consult obtained. Discharged home after 3 days. Admitted 01/11/17 due to NSTEMI. Cardiology consult obtained. Catheterization done. Treated with heparin drip, aspirin, plavix and lipitor. Medications adjusted for HTN. Discharged home after 2 days.   He presents today for his follow-up visit with a chief complaint of mild shortness of breath upon moderate exertion. He describes this as chronic in nature having been present for several years with varying levels of severity. He has associated fatigue, edema and light-headedness along with this. Denies any chest pain or difficulty sleeping.   Past Medical History:  Diagnosis Date  . Adrenal gland anomaly   . Arthritis   . CAD (coronary artery disease)    a. prior MI and PCI/stenting to mLCx & mRCA in 04/2006; b. NSTEMI mid July 2018 with medical management, LHC 7/18: LM 30%, pLAD 30%, mLAD 50%, dLAD 30%, D1-3 min irregs, mLCx 100% previously treated w/ PCI, OM1 min irregs, pRCA 20%, mRCA lesion-1 10%, mRCA lesion-2 70%, dRCA 50%, RPDA min irregs, post atrio 40%   . CKD (chronic kidney disease), stage III   . Colon polyps   . Diabetes mellitus with  neuropathy ([place])   . DM2 (diabetes mellitus, type 2) (HCC)    insulin requiring  . Dyslipidemia   . GERD (gastroesophageal reflux disease)   . HTN (hypertension)   . Kidney stones   . Metabolic syndrome   . Myocardial infarction ([place])   . OSA (obstructive sleep apnea)   . 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 Recurrent UTI   . Renal artery stenosis (HCC)    L sided, stent 07/2013  . Rotator cuff disorder    right  . Skin cancer    head  . Systolic dysfunction    a. TTE 12/2016: EF 50-55%, mod concentric LVH, images inadequate for wall motion assessment, not technically sufficienct to allow for LV dias fxn, calcified mitral annulus   . Thyroid disorder    Past Surgical History:  Procedure Laterality Date  . adrenal adenoma removal  1990-   right  . CARPAL TUNNEL RELEASE     bilateral  . CORONARY ANGIOPLASTY WITH STENT PLACEMENT  2007   x 2  . KNEE SURGERY     right  . LEFT HEART CATH AND CORONARY ANGIOGRAPHY N/A 01/11/2017   Procedure: Left Heart Cath and Coronary Angiography;  Surgeon: Wellington Hampshire, MD;  Location: [facility];  Service: Cardiovascular;  Laterality: N/A;  . LUNG BIOPSY    . RENAL ARTERY STENT  2015   L  . SKIN CANCER EXCISION     back of head   Family History  Problem Relation  Age of Onset  . Heart attack Father   . Heart attack Mother   . Heart attack Brother   . COPD Sister   . Arthritis Maternal Grandmother   . Breast cancer Maternal Aunt   . Diabetes Maternal Aunt   . Hypertension Other   . Hypertension Maternal Aunt    Social History  Substance Use Topics  . Smoking status: Never Smoker  . Smokeless tobacco: Never Used  . Alcohol use No   Allergies  Allergen Reactions  . Calcium Channel Blockers     Would avoid if possible due to h/o peripheral edema   Prior to Admission medications   Medication Sig Start Date End Date Taking? Authorizing  Provider  ACCU-CHEK SOFTCLIX LANCETS lancets Use as instructed to check blood sugar three times daily and as needed.  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 03/09/16  Yes [name], MD  amLODipine (NORVASC) 5 MG tablet Take 1 tablet (5 mg total) by mouth daily. 01/14/17  Yes Shaune Pollack, MD  aspirin 81 MG tablet Take 324 mg by mouth daily.    Yes [provider]  atorvastatin (LIPITOR) 80 MG tablet TAKE 1 TABLET AT BEDTIME 02/05/17  Yes [name], MD  Blood Glucose Monitoring Suppl (ACCU-CHEK AVIVA PLUS) w/Device KIT Use to check blood sugar three times daily and as needed.  Diagnosis: E11.22  Insulin-dependent 03/02/16  Yes [name], MD  carvedilol (COREG) 25 MG tablet TAKE 1 TABLET TWICE DAILY WITH A MEAL 02/05/17  Yes [name], MD  cloNIDine (CATAPRES) 0.3 MG tablet TAKE 1 TABLET THREE TIMES DAILY 02/05/17  Yes Gollan, Tollie Pizza, MD  clopidogrel (PLAVIX) 75 MG tablet Take 1 tablet (75 mg total) by mouth daily. 08/07/16  Yes Antonieta Iba, MD  diphenhydrAMINE (BENADRYL) 50 MG tablet Take 50 mg by mouth at bedtime as needed. Pt takes every night   Yes [provider]  docusate sodium (COLACE) 100 MG capsule Take 100 mg by mouth 2 (two) times daily. As needed.   Yes [provider]  gabapentin (NEURONTIN) 800 MG tablet TAKE 1 TABLET IN THE MORNING  AND TAKE 1 AND 1/2 TABLETS AT BEDTIME 10/25/16  Yes [name], MD  glucose blood (ACCU-CHEK AVIVA PLUS) test strip Use as instructed to check blood sugar 3 times daily or as needed.  Diagnosis:  E11.22  Insulin-dependent. 03/02/16  Yes [name], MD  hydrALAZINE (APRESOLINE) 50 MG tablet Take 1 tablet (50 mg total) by mouth 3 (three) times daily. 01/22/17  Yes [name], MD  insulin NPH-regular Human (NOVOLIN 70/30) (70-30) 100 UNIT/ML injection INJECT  70 UNITS SUBCUTANEOUSLY EVERY MORNING  AND  50 units at bedtime 01/22/17  Yes [name], MD  Insulin Syringe-Needle U-100 (INSULIN  SYRINGE 1CC/30GX5/16") 30G X 5/16" 1 ML MISC Use as directed to take insulin Dx 250.62 07/14/13  Yes Eustaquio Boyden, MD  isosorbide mononitrate (IMDUR) 30 MG 24 hr tablet Take 3 tablets (90 mg total) by mouth 2 (two) times daily. 01/13/17  Yes Shaune Pollack, MD  nitroGLYCERIN (NITROSTAT) 0.4 MG SL tablet Place 1 tablet (0.4 mg total) under the tongue every 5 (five) minutes as needed. May repeat x3 01/18/17  Yes [name], MD  NON FORMULARY Oxygen 2 liters 24/7   Yes [provider]  omeprazole (PRILOSEC) 20 MG capsule Take 20 mg by mouth daily.     Yes [provider]  oxyCODONE (ROXICODONE) 15 MG immediate release tablet  Take 1 tablet (15 mg total) by mouth every evening. 01/22/17  Yes [name], MD  torsemide (DEMADEX) 20 MG tablet Take 2 tablets (40 mg total) by mouth daily. 01/23/17  Yes [name], MD  sulfamethoxazole-trimethoprim (BACTRIM DS,SEPTRA DS) 800-160 MG tablet Take 1 tablet by mouth 2 (two) times daily. Patient not taking: Reported on 02/27/2017 02/26/17   [name], MD    Review of Systems  Constitutional: Positive for fatigue. Negative for appetite change.  HENT: Negative for congestion, rhinorrhea and sore throat.   Eyes: Negative.   Respiratory: Positive for shortness of breath (improving). Negative for chest tightness and wheezing.   Cardiovascular: Positive for leg swelling. Negative for chest pain and palpitations.  Gastrointestinal: Negative for abdominal distention and abdominal pain.  Endocrine: Negative.   Genitourinary: Positive for dysuria. Negative for hematuria.  Musculoskeletal: Negative for back pain and neck pain.  Skin: Negative.   Allergic/Immunologic: Negative.   Neurological: Positive for light-headedness. Negative for dizziness.  Hematological: Negative for adenopathy. Does not bruise/bleed easily.  Psychiatric/Behavioral: Negative for dysphoric mood and sleep disturbance (sleeping on 1 pillow; wearing oxygent at  2L around the clock along with CPAP). The patient is not nervous/anxious.    Vitals:   02/27/17 1037  BP: (!) 147/53  Pulse: 60  Resp: 20  SpO2: 95%  Weight: 260 lb 2 oz (118 kg)  Height: '5\' 5"'$  (1.651 m)   Wt Readings from Last 3 Encounters:  02/27/17 260 lb 2 oz (118 kg)  02/02/17 252 lb (114.3 kg)  02/01/17 258 lb 12 oz (117.4 kg)    Lab Results  Component Value Date   CREATININE 1.60 (H) 02/01/2017   CREATININE 1.73 (H) 01/30/2017   CREATININE 1.60 (H) 01/22/2017    Physical Exam  Constitutional: He is oriented to person, place, and time. He appears well-developed and well-nourished.  HENT:  Head: Normocephalic and atraumatic.  Neck: Normal range of motion. Neck supple. No JVD present.  Cardiovascular: Regular rhythm.  Bradycardia present.   Pulmonary/Chest: Effort normal. He has no wheezes. He has no rales.  Abdominal: Soft. He exhibits no distension. There is no tenderness.  Musculoskeletal: He exhibits edema (1+ edema in bilateral lower legs). He exhibits no tenderness.  Neurological: He is alert and oriented to person, place, and time.  Skin: Skin is warm and dry.  Psychiatric: He has a normal mood and affect. His behavior is normal. Thought content normal.  Nursing note and vitals reviewed.  Assessment and Plan:  1: Chronic heart failure with preserved ejection fraction- - NYHA class II - euvolemic today - weighing daily and he was instructed to call for an overnight weight gain of >2 pounds or a weekly weight gain of >5 pounds - not adding salt and wife has been reading food labels. Reviewed the importance of closely following a '2000mg'$  sodium diet - wearing support socks and elevating legs on occasion which helps a little bit in reducing edema - is trying to increase his activity but is unsure of how much he can do. May benefit from [facility]/cardiac rehab - wears oxygen at 2L around the clock - does not meet ReDS criteria due to BMI - saw cardiology (Dunn)  02/01/17 and returns 03/12/17  2: HTN- - BP looks good today - BMP from 02/01/17 shows sodium 138, potassium 3.9 and GFR 44 - saw PCP [name]) 01/22/17  3: Diabetes- - glucose at home this morning was 125 - A1c on 01/11/17 was 7.0%  4:  Obstructive sleep apnea- - wears CPAP on a nightly basis  Patient did not bring his medications nor a list. Each medication was verbally reviewed with the patient and he was encouraged to bring the bottles to every visit to confirm accuracy of list.  Return in 6 months or sooner for any questions/problems before then.

## 2017-02-27 NOTE — Telephone Encounter (Signed)
Wife advised.  Patient is at another appointment and she will try to call and have him come by for a urine specimen and advise him of the Rx.  Appointment scheduled tomorrow at 5:30 pm.

## 2017-02-28 ENCOUNTER — Encounter: Payer: Self-pay | Admitting: Family Medicine

## 2017-02-28 ENCOUNTER — Ambulatory Visit (INDEPENDENT_AMBULATORY_CARE_PROVIDER_SITE_OTHER): Payer: Medicare HMO | Admitting: Family Medicine

## 2017-02-28 DIAGNOSIS — I10 Essential (primary) hypertension: Secondary | ICD-10-CM

## 2017-02-28 DIAGNOSIS — N3000 Acute cystitis without hematuria: Secondary | ICD-10-CM

## 2017-02-28 MED ORDER — AMLODIPINE BESYLATE 5 MG PO TABS: 5.0000 mg | ORAL_TABLET | Freq: Every day | ORAL | 3 refills | Status: DC

## 2017-02-28 NOTE — Progress Notes (Signed)
Swelling is controlled and BP is reasonable, so it is likely okay to continue amlodipine for now.  Not lightheaded.   D/w pt.    Still taking prn oxycodone for his back, on the "bad" nights with more pain.  It helps, when needed.  No ADE on med.    Prev Bcx with E coli.   F/u Ucx 7/31 with mult bacteria, presumed contaminated.   2nc ucx with SERRATIA  F/u ucx pending.   Prev u/s with  1. The recently demonstrated low-density lesions in both kidneys are simple cysts. 2. Moderately to markedly enlarged prostate gland protruding into the base of the urinary bladder.  Recent burning with urination is better in the meantime.  Currently on septra. No fevers.  No sweats, no vomiting.    He had mult house repairs- septic tank, refrigerator, phone trouble, then his stove failed, d/w pt.   Meds, vitals, and allergies reviewed.   ROS: Per HPI unless specifically indicated in ROS section   GEN: nad, alert and oriented, on O2 at baseline.  HEENT: mucous membranes moist NECK: supple w/o LA CV: rrr. PULM: ctab, no inc wob ABD: soft, +bs EXT: R>L leg edema but this is improved from prev.

## 2017-02-28 NOTE — Patient Instructions (Signed)
If the swelling gets worse, then stop amlodipine and update me.   Finish the antibiotics.  Update me if not better.   We'll contact you with your lab report. We'll check with urology.  Take care.  Glad to see you.

## 2017-03-01 ENCOUNTER — Telehealth: Payer: Self-pay | Admitting: Urology

## 2017-03-01 LAB — URINE CULTURE

## 2017-03-01 NOTE — Telephone Encounter (Signed)
Patient has reschd app   Stephen [name]

## 2017-03-01 NOTE — Telephone Encounter (Signed)
-----   Message from Hollice Espy, MD sent at 03/01/2017  8:17 AM EDT ----- Regarding: FW: follow up  Can you call this guy and see if he wants to come in nonurgently?  PCP contacted me.  He cancelled his last f/u with me and does not look like he rescheduled.    Hollice Espy, MD  ----- Message ----- From: [name], MD Sent: 03/01/2017   5:39 AM To: Hollice Espy, MD Subject: follow up                                      Can you please get patient in your clinic for follow-up? He is having recurrent UTIs and I would like your input. Many thanks.  Brigitte Pulse

## 2017-03-01 NOTE — Assessment & Plan Note (Signed)
Await urine culture. Even if contaminated I would still go ahead with treatment given his recent history. See notes on urine culture when resulted. We will ask for urology input. My question is why he is getting recurrent urinary tract infections to begin with. He had a previous blood culture with Escherichia coli with presumed urinary source. He had a follow-up urine culture with Serratia noted. D/w pt.  He agrees.

## 2017-03-01 NOTE — Assessment & Plan Note (Signed)
Able to tolerate amlodipine as is. Continue as is.

## 2017-03-05 ENCOUNTER — Other Ambulatory Visit: Payer: Self-pay | Admitting: Family Medicine

## 2017-03-05 DIAGNOSIS — Z794 Long term (current) use of insulin: Secondary | ICD-10-CM

## 2017-03-05 DIAGNOSIS — E1122 Type 2 diabetes mellitus with diabetic chronic kidney disease: Secondary | ICD-10-CM

## 2017-03-05 DIAGNOSIS — N183 Chronic kidney disease, stage 3 (moderate): Principal | ICD-10-CM

## 2017-03-06 ENCOUNTER — Other Ambulatory Visit (INDEPENDENT_AMBULATORY_CARE_PROVIDER_SITE_OTHER): Payer: Medicare HMO

## 2017-03-06 DIAGNOSIS — E1122 Type 2 diabetes mellitus with diabetic chronic kidney disease: Secondary | ICD-10-CM | POA: Diagnosis not present

## 2017-03-06 DIAGNOSIS — Z794 Long term (current) use of insulin: Secondary | ICD-10-CM

## 2017-03-06 DIAGNOSIS — N183 Chronic kidney disease, stage 3 unspecified: Secondary | ICD-10-CM

## 2017-03-06 LAB — BASIC METABOLIC PANEL
BUN: 30 mg/dL — ABNORMAL HIGH (ref 6–23)
CO2: 28 mEq/L (ref 19–32)
Calcium: 9 mg/dL (ref 8.4–10.5)
Chloride: 101 mEq/L (ref 96–112)
Creatinine, Ser: 2.39 mg/dL — ABNORMAL HIGH (ref 0.40–1.50)
GFR: 28.81 mL/min — ABNORMAL LOW (ref >60.00)
Glucose, Bld: 118 mg/dL — ABNORMAL HIGH (ref 70–99)
Potassium: 3.6 mEq/L (ref 3.5–5.1)
Sodium: 139 mEq/L (ref 135–145)

## 2017-03-06 LAB — HEMOGLOBIN A1C: Hgb A1c MFr Bld: 8 % — ABNORMAL HIGH (ref 4.6–6.5)

## 2017-03-07 ENCOUNTER — Ambulatory Visit (INDEPENDENT_AMBULATORY_CARE_PROVIDER_SITE_OTHER): Payer: Medicare HMO | Admitting: Cardiovascular Disease

## 2017-03-07 ENCOUNTER — Encounter: Payer: Self-pay | Admitting: Cardiovascular Disease

## 2017-03-07 VITALS — BP 148/66 | HR 66 | Ht 65.0 in | Wt 261.0 lb

## 2017-03-07 DIAGNOSIS — Z794 Long term (current) use of insulin: Secondary | ICD-10-CM

## 2017-03-07 DIAGNOSIS — N179 Acute kidney failure, unspecified: Secondary | ICD-10-CM | POA: Diagnosis not present

## 2017-03-07 DIAGNOSIS — J8410 Pulmonary fibrosis, unspecified: Secondary | ICD-10-CM

## 2017-03-07 DIAGNOSIS — I5032 Chronic diastolic (congestive) heart failure: Secondary | ICD-10-CM | POA: Diagnosis not present

## 2017-03-07 DIAGNOSIS — E1122 Type 2 diabetes mellitus with diabetic chronic kidney disease: Secondary | ICD-10-CM

## 2017-03-07 DIAGNOSIS — I25119 Atherosclerotic heart disease of native coronary artery with unspecified angina pectoris: Secondary | ICD-10-CM

## 2017-03-07 DIAGNOSIS — E785 Hyperlipidemia, unspecified: Secondary | ICD-10-CM

## 2017-03-07 DIAGNOSIS — N183 Chronic kidney disease, stage 3 unspecified: Secondary | ICD-10-CM

## 2017-03-07 DIAGNOSIS — I10 Essential (primary) hypertension: Secondary | ICD-10-CM

## 2017-03-07 MED ORDER — TORSEMIDE 20 MG PO TABS: 40.0000 mg | ORAL_TABLET | Freq: Every day | ORAL | 3 refills | Status: DC

## 2017-03-07 NOTE — Progress Notes (Signed)
Cardiology Office Note  Date:  03/07/2017   ID:  Stephen [name], Stephen [name] 22-Aug-1945, MRN [id number]  PCP:  Stephen Ghent, MD   Chief Complaint  Patient presents with  . other    1 month f/u. Meds reviewed verbally with pt.    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 Status post stent to his left renal artery for 80+ percent stenosis several weeks ago, performed by Dr. Lucky Cowboy. laser treatment of the lower extremity veins for leg swelling/venous insufficiency who presents for routine followup of his coronary artery disease  And Shortness of breath  Cath 01/11/2017, occluded LCX, with collaterals Distal RCA stenosis in-stent 70%, medical management at this time Results discussed with him on today's visit  Echocardiogram in follow-up with ejection fraction 50-55% Results discussed with him on today's visit  Lab work reviewed with him in detail HBA1C 8.0 Cr 2.39, BUN 30 Total chol 141  On his last clinic visit had worsening leg swelling, shortness of breath Changed Lasix to torsemide 40 mg daily Improved symptoms, weight does not seem to have changed very much Less short of breath, less orthopnea and PND Lab work yesterday with climb and creatinine 2.4, previous baseline 1.6  Not using lymphedema compression pumps Needs to see vascular Dr. Brantley Stage to reorder  Denies significant chest pain concerning for angina Previously using nitroglycerin  Other past medical history Stress test July 2011 was dobutamine stress that showed no significant ischemia, inadequate heart rate was achieved. right and left heart  catheterization January 2011 showed 50% mid LAD disease, 60% at the ostium of the diagonal #2, 30% proximal left circumflex disease, patent stent in the left circumflex, patent stent of the mid RCA, wedge pressure of 16, PA pressure mean 22, right ventricular pressure 41/8 .  echocardiogram September 2010 shows normal systolic function, diastolic dysfunction, normal RV function unable to evaluate right ventricular systolic pressures  PMH:   has a past medical history of Adrenal gland anomaly; Arthritis; CAD (coronary artery disease); CKD (chronic kidney disease), stage III; Colon polyps; Diabetes mellitus with neuropathy ([place]); DM2 (diabetes mellitus, type 2) ([place]); Dyslipidemia; GERD (gastroesophageal reflux disease); HTN (hypertension); Kidney stones; Metabolic syndrome; Myocardial infarction ([place]); OSA (obstructive sleep apnea); Pulmonary fibrosis ([place]); Recurrent UTI; Renal artery stenosis ([place]); Rotator cuff disorder; Skin cancer; Systolic dysfunction; and Thyroid disorder.  PSH:    Past Surgical History:  Procedure Laterality Date  . adrenal adenoma removal  1990-   right  . CARPAL TUNNEL RELEASE     bilateral  . CORONARY ANGIOPLASTY WITH STENT PLACEMENT  2007   x 2  . KNEE SURGERY     right  . LEFT HEART CATH AND CORONARY ANGIOGRAPHY N/A 01/11/2017   Procedure: Left Heart Cath and Coronary Angiography;  Surgeon: Wellington Hampshire, MD;  Location: [facility];  Service: Cardiovascular;  Laterality: N/A;  . LUNG BIOPSY    . RENAL ARTERY STENT  2015   L  . SKIN CANCER EXCISION     back of head    Current Outpatient Prescriptions  Medication Sig Dispense Refill  . ACCU-CHEK SOFTCLIX LANCETS lancets Use as instructed to  check blood sugar three times daily and as needed.  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 100 each 5  . amLODipine (NORVASC) 5 MG tablet Take 1 tablet (5 mg total) by mouth daily. 90 tablet 3  . aspirin 81 MG tablet Take 324 mg by mouth daily.     Marland Kitchen  atorvastatin (LIPITOR) 80 MG tablet TAKE 1 TABLET AT BEDTIME 90 tablet 2  . Blood Glucose Monitoring Suppl (ACCU-CHEK AVIVA PLUS) w/Device KIT Use to check blood sugar three times daily and as needed.  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 carvedilol (COREG) 25 MG tablet TAKE 1 TABLET TWICE DAILY WITH A MEAL 180 tablet 2  . cloNIDine (CATAPRES) 0.3 MG tablet TAKE 1 TABLET THREE TIMES DAILY 270 tablet 3  . clopidogrel (PLAVIX) 75 MG tablet Take 1 tablet (75 mg total) by mouth daily. 30 tablet 3  . diphenhydrAMINE (BENADRYL) 50 MG tablet Take 50 mg by mouth at bedtime as needed. Pt takes every night    . docusate sodium (COLACE) 100 MG capsule Take 100 mg by mouth 2 (two) times daily. As needed.    . gabapentin (NEURONTIN) 800 MG tablet TAKE 1 TABLET IN THE MORNING  AND TAKE 1 AND 1/2 TABLETS AT BEDTIME 225 tablet 3  . glucose blood (ACCU-CHEK AVIVA PLUS) test strip Use as instructed to check blood sugar 3 times daily or as needed.  Diagnosis:  E11.22  Insulin-dependent. 300 each 3  . hydrALAZINE (APRESOLINE) 50 MG tablet Take 1 tablet (50 mg total) by mouth 3 (three) times daily. 90 tablet 0  . insulin NPH-regular Human (NOVOLIN 70/30) (70-30) 100 UNIT/ML injection INJECT  70 UNITS SUBCUTANEOUSLY EVERY MORNING  AND  50 units at bedtime 10 vial 1  . Insulin Syringe-Needle U-100 (INSULIN SYRINGE 1CC/30GX5/16") 30G X 5/16" 1 ML MISC Use as directed to take insulin Dx 250.62 180 each 1  . isosorbide mononitrate (IMDUR) 30 MG 24 hr tablet Take 3 tablets (90 mg total) by mouth 2 (two) times daily. 180 tablet 0  . nitroGLYCERIN (NITROSTAT) 0.4 MG SL tablet Place 1 tablet (0.4 mg total) under the tongue every 5 (five) minutes as needed. May repeat x3 25 tablet prn  . NON FORMULARY Oxygen 2 liters 24/7    . omeprazole (PRILOSEC) 20 MG capsule Take 20 mg by mouth daily.      Marland Kitchen oxyCODONE (ROXICODONE) 15 MG immediate release tablet Take 1 tablet (15 mg total) by mouth every evening. 30 tablet 0  .  torsemide (DEMADEX) 20 MG tablet Take 2 tablets (40 mg total) by mouth daily. 60 tablet 0   No current facility-administered medications for this visit.      Allergies:   Calcium channel blockers   Social History:  The patient  reports that he has never smoked. He has never used smokeless tobacco. He reports that he does not drink alcohol or use drugs.   Family History:   family history includes Arthritis in his maternal grandmother; Breast cancer in his maternal aunt; COPD in his sister; Diabetes in his maternal aunt; Heart attack in his brother, father, and mother; Hypertension in his maternal aunt and other.    Review of Systems: Review of Systems  Constitutional: Negative.   Respiratory: Positive for shortness of breath.   Cardiovascular: Positive for leg swelling.  Gastrointestinal: Negative.   Musculoskeletal: Negative.   Neurological: Negative.   Psychiatric/Behavioral: Negative.   All other systems reviewed and are negative.    PHYSICAL EXAM: VS:  BP (!) 148/66 (BP Location: Left Arm, Patient Position: Sitting, Cuff Size: Large)   Pulse 66   Ht '5\' 5"'$  (1.651 m)   Wt 261 lb (118.4 kg)   BMI 43.43 kg/m  , BMI Body mass index is 43.43 kg/m. GEN: Well nourished, well developed, in no acute distress , morbid obesity HEENT: normal  Neck: no JVD, carotid bruits, or masses Cardiac: RRR; no murmurs, rubs, or gallops,bilateral lower extremity nonpitting  edema  Respiratory:  clear to auscultation bilaterally, normal work of breathing GI: soft, nontender, nondistended, + BS MS: no deformity or atrophy  Skin: warm and dry, no rash Neuro:  Strength and sensation are intact Psych: euthymic mood, full affect    Recent Labs: 01/15/2017: ALT 68 01/16/2017: Magnesium 1.9 01/22/2017: Hemoglobin 11.8; Platelets 344.0 03/06/2017: BUN 30; Creatinine, Ser 2.39; Potassium 3.6; Sodium 139    Lipid Panel Lab Results  Component Value Date   CHOL 141 01/12/2017   HDL 22 (L) 01/12/2017    LDLCALC 87 01/12/2017   TRIG 160 (H) 01/12/2017      Wt Readings from Last 3 Encounters:  03/07/17 261 lb (118.4 kg)  02/28/17 256 lb (116.1 kg)  02/27/17 260 lb 2 oz (118 kg)       ASSESSMENT AND PLAN:  Atherosclerosis of native coronary artery of native heart without angina pectoris -  Denies chest pain, Chest tightness improved with diuresis No further ischemic workup at this time Cardiac catheterization results reviewed with him  Essential hypertension, benign - Plan: EKG 12-Lead continue torsemide Given climbing creatinine we'll decrease dose down to 40 mg alternating with 20 mg daily Will miss a dose today  Chronic diastolic CHF (congestive heart failure) ([place]) - Plan: EKG 12-Lead As above will alternate torsemide 40 with 20 He has moderated his fluid intake  Mixed hyperlipidemia Cholesterol is at goal on the current lipid regimen. No changes to the medications were made.  Renal artery stenosis, native [facility]) followed by Dr. Lucky Cowboy Chronic edema,  needs lymphedema compression pumps with compression hose Has follow-up with vascular Dr.  Morbid obesity [facility]) We have encouraged continued exercise, careful diet management in an effort to lose weight.  PULMONARY FIBROSIS On and off oxygen Followed by pulmonary  Type 2 diabetes mellitus with chronic kidney disease, with long-term current use of insulin, unspecified CKD stage (HCC) Uncontrolled, We have encouraged continued exercise, careful diet management in an effort to lose weight. Weight likely contributing to his shortness of breath  Chronic kidney disease, stage III (moderate) Followed by Dr. Holley Raring   Total encounter time more than 25 minutes  Greater than 50% was spent in counseling and coordination of care with the patient   Disposition:   F/U  6 months   No orders of the defined types were placed in this encounter.    Signed, Esmond Plants, M.D., Ph.D. 03/07/2017  [place], [place]

## 2017-03-07 NOTE — Patient Instructions (Signed)
Medication Instructions:   Hold torsemide today Take one torsemide tomorrow Alternate torsemide 2 pills with one pill every other day  Labwork:  No new labs needed  Testing/Procedures:  No further testing at this time   Follow-Up: It was a pleasure seeing you in the office today. Please call us if you have new issues that need to be addressed before your next appt.  [phone]  Your physician wants you to follow-up in: 6 months.  You will receive a reminder letter in the mail two months in advance. If you don't receive a letter, please call our office to schedule the follow-up appointment.  If you need a refill on your cardiac medications before your next appointment, please call your pharmacy.

## 2017-03-08 DIAGNOSIS — J449 Chronic obstructive pulmonary disease, unspecified: Secondary | ICD-10-CM | POA: Diagnosis not present

## 2017-03-12 ENCOUNTER — Encounter: Payer: Self-pay | Admitting: Family Medicine

## 2017-03-12 ENCOUNTER — Ambulatory Visit (INDEPENDENT_AMBULATORY_CARE_PROVIDER_SITE_OTHER): Payer: Medicare HMO | Admitting: Family Medicine

## 2017-03-12 VITALS — BP 146/64 | HR 59 | Temp 97.7°F | Ht 65.0 in | Wt 260.5 lb

## 2017-03-12 DIAGNOSIS — N3000 Acute cystitis without hematuria: Secondary | ICD-10-CM | POA: Diagnosis not present

## 2017-03-12 DIAGNOSIS — Z23 Encounter for immunization: Secondary | ICD-10-CM | POA: Diagnosis not present

## 2017-03-12 DIAGNOSIS — E1122 Type 2 diabetes mellitus with diabetic chronic kidney disease: Secondary | ICD-10-CM

## 2017-03-12 DIAGNOSIS — Z794 Long term (current) use of insulin: Secondary | ICD-10-CM

## 2017-03-12 DIAGNOSIS — R7989 Other specified abnormal findings of blood chemistry: Secondary | ICD-10-CM | POA: Diagnosis not present

## 2017-03-12 DIAGNOSIS — N183 Chronic kidney disease, stage 3 (moderate): Secondary | ICD-10-CM

## 2017-03-12 DIAGNOSIS — M5441 Lumbago with sciatica, right side: Secondary | ICD-10-CM

## 2017-03-12 DIAGNOSIS — G8929 Other chronic pain: Secondary | ICD-10-CM | POA: Diagnosis not present

## 2017-03-12 LAB — BASIC METABOLIC PANEL
BUN: 22 mg/dL (ref 6–23)
CO2: 29 mEq/L (ref 19–32)
Calcium: 9.2 mg/dL (ref 8.4–10.5)
Chloride: 101 mEq/L (ref 96–112)
Creatinine, Ser: 1.77 mg/dL — ABNORMAL HIGH (ref 0.40–1.50)
GFR: 40.73 mL/min — ABNORMAL LOW (ref >60.00)
Glucose, Bld: 204 mg/dL — ABNORMAL HIGH (ref 70–99)
Potassium: 3.8 mEq/L (ref 3.5–5.1)
Sodium: 139 mEq/L (ref 135–145)

## 2017-03-12 MED ORDER — OXYCODONE HCL 15 MG PO TABS: 15.0000 mg | ORAL_TABLET | Freq: Every evening | ORAL | 0 refills | Status: DC

## 2017-03-12 MED ORDER — TORSEMIDE 20 MG PO TABS: ORAL_TABLET | ORAL | Status: DC

## 2017-03-12 NOTE — Patient Instructions (Signed)
Go to the lab on the way out.  We'll contact you with your lab report. Take care.  Glad to see you.  Keep alternating 40mg  and 20mg  torsemide for now.  We may end up needing to adjust that.   Plan on recheck in about 3 months.   Update me as needed in the meantime.

## 2017-03-12 NOTE — Progress Notes (Signed)
Diabetes:  Using medications without difficulties:yes Hypoglycemic episodes:no Hyperglycemic episodes: not since prev UTIs resolved  Feet problems: edema but not tingling.   Blood Sugars averaging: 80-140s recently.   eye exam within last year: due when possible.   A1c higher, sugar had been higher with prev illnesses.   Weight is stable.   Using pain medicine as needed at night for lower back pain.  No ADE on med.  Compliant. rx done at [facility].   No dysuria.  Sx resolved. None since last abx treatment.  He is voiding well per his report.  Prev Cr elevation- alternating 40 and 20mg  torsemide daily.  No ADE on med.  Edema tolerable, not worse with lower dose of torsemide.    He has f/u with vein clinic pending.  He had cards f/u.  No CP.    Flu shot done at office visit.  Meds, vitals, and allergies reviewed.   ROS: Per HPI unless specifically indicated in ROS section   GEN: nad, alert and oriented HEENT: mucous membranes moist NECK: supple w/o LA CV: rrr. PULM: ctab, no inc wob ABD: soft, +bs EXT: no edema SKIN: no acute rash 2+ BLE in compression stockings.

## 2017-03-13 ENCOUNTER — Encounter (INDEPENDENT_AMBULATORY_CARE_PROVIDER_SITE_OTHER): Payer: Self-pay | Admitting: Vascular Surgery

## 2017-03-13 ENCOUNTER — Ambulatory Visit (INDEPENDENT_AMBULATORY_CARE_PROVIDER_SITE_OTHER): Payer: Medicare HMO | Admitting: Vascular Surgery

## 2017-03-13 VITALS — BP 145/71 | HR 61 | Resp 16 | Wt 262.4 lb

## 2017-03-13 DIAGNOSIS — I701 Atherosclerosis of renal artery: Secondary | ICD-10-CM

## 2017-03-13 DIAGNOSIS — N183 Chronic kidney disease, stage 3 unspecified: Secondary | ICD-10-CM

## 2017-03-13 DIAGNOSIS — I890 Lymphedema, not elsewhere classified: Secondary | ICD-10-CM | POA: Diagnosis not present

## 2017-03-13 DIAGNOSIS — I10 Essential (primary) hypertension: Secondary | ICD-10-CM

## 2017-03-13 DIAGNOSIS — R7989 Other specified abnormal findings of blood chemistry: Secondary | ICD-10-CM | POA: Insufficient documentation

## 2017-03-13 DIAGNOSIS — Z794 Long term (current) use of insulin: Secondary | ICD-10-CM

## 2017-03-13 DIAGNOSIS — E1122 Type 2 diabetes mellitus with diabetic chronic kidney disease: Secondary | ICD-10-CM

## 2017-03-13 NOTE — Assessment & Plan Note (Addendum)
blood glucose control important in reducing the progression of atherosclerotic disease. Also, involved in wound healing. On appropriate medications.  

## 2017-03-13 NOTE — Patient Instructions (Signed)

## 2017-03-13 NOTE — Assessment & Plan Note (Signed)
blood pressure control important in reducing the progression of atherosclerotic disease. On appropriate oral medications.  

## 2017-03-13 NOTE — Assessment & Plan Note (Signed)
Now on lower dose of torsemide, alternating 20 and 40 mg daily. Recheck creatinine today. See notes on labs.

## 2017-03-13 NOTE — Assessment & Plan Note (Signed)
Likely contributes to LE swelling

## 2017-03-13 NOTE — Assessment & Plan Note (Signed)
A1c is higher blood sugar had been higher in general with recent illnesses. Blood sugar has been improve recently. Continue as is. Recheck in about 3 months. I would expect him to have some gradual improvement in his A1c assuming he did not have another acute illness.

## 2017-03-13 NOTE — Assessment & Plan Note (Signed)
Has not been checked in some.  Will check duplex at his next visit.

## 2017-03-13 NOTE — Progress Notes (Signed)
MRN : [id number]  Stephen [name] is a 71 y.o. ([birth year]) male who presents with chief complaint of  Chief Complaint  Patient presents with  . Follow-up    2-33month .  History of Present Illness: Patient returns today in follow up of Leg swelling. He has not been able to get his lymphedema pump as he was admitted to the hospital with heart attack and has had a several other ongoing issues since his last visit. He is doing well today. Compression stockings have helped keep the swelling under reasonably good control but the swelling is still certainly present.  Current Outpatient Prescriptions  Medication Sig Dispense Refill  . ACCU-CHEK SOFTCLIX LANCETS lancets Use as instructed to check blood sugar three times daily and as needed.  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 100 each 5  . amLODipine (NORVASC) 5 MG tablet Take 1 tablet (5 mg total) by mouth daily. 90 tablet 3  . aspirin 81 MG tablet Take 324 mg by mouth daily.     .Marland Kitchenatorvastatin (LIPITOR) 80 MG tablet TAKE 1 TABLET AT BEDTIME 90 tablet 2  . Blood Glucose Monitoring Suppl (ACCU-CHEK AVIVA PLUS) w/Device KIT Use to check blood sugar three times daily and as needed.  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 carvedilol (COREG) 25 MG tablet TAKE 1 TABLET TWICE DAILY WITH A MEAL 180 tablet 2  . cloNIDine (CATAPRES) 0.3 MG tablet TAKE 1 TABLET THREE TIMES DAILY 270 tablet 3  . clopidogrel (PLAVIX) 75 MG tablet Take 1 tablet (75 mg total) by mouth daily. 30 tablet 3  . diphenhydrAMINE (BENADRYL) 50 MG tablet Take 50 mg by mouth at bedtime as needed. Pt takes every night    . docusate sodium (COLACE) 100 MG capsule Take 100 mg by mouth 2 (two) times daily. As needed.    . gabapentin (NEURONTIN) 800 MG tablet TAKE 1 TABLET IN THE MORNING  AND TAKE 1 AND 1/2 TABLETS AT BEDTIME 225 tablet 3  . glucose blood (ACCU-CHEK AVIVA PLUS) test strip Use as instructed to check blood sugar 3 times daily or as needed.  Diagnosis:  E11.22   Insulin-dependent. 300 each 3  . hydrALAZINE (APRESOLINE) 50 MG tablet Take 1 tablet (50 mg total) by mouth 3 (three) times daily. 90 tablet 0  . insulin NPH-regular Human (NOVOLIN 70/30) (70-30) 100 UNIT/ML injection INJECT  70 UNITS SUBCUTANEOUSLY EVERY MORNING  AND  50 units at bedtime 10 vial 1  . Insulin Syringe-Needle U-100 (INSULIN SYRINGE 1CC/30GX5/16") 30G X 5/16" 1 ML MISC Use as directed to take insulin Dx 250.62 180 each 1  . isosorbide mononitrate (IMDUR) 30 MG 24 hr tablet Take 3 tablets (90 mg total) by mouth 2 (two) times daily. 180 tablet 0  . nitroGLYCERIN (NITROSTAT) 0.4 MG SL tablet Place 1 tablet (0.4 mg total) under the tongue every 5 (five) minutes as needed. May repeat x3 25 tablet prn  . NON FORMULARY Oxygen 2 liters 24/7    . omeprazole (PRILOSEC) 20 MG capsule Take 20 mg by mouth daily.      .Marland KitchenoxyCODONE (ROXICODONE) 15 MG immediate release tablet Take 1 tablet (15 mg total) by mouth every evening. 30 tablet 0  . torsemide (DEMADEX) 20 MG tablet alternate 428mand 2013maily.     No current facility-administered medications for this visit.     Past Medical History:  Diagnosis Date  . Adrenal gland anomaly   . Arthritis   . CAD (coronary artery  disease)    a. prior MI and PCI/stenting to mLCx & mRCA in 04/2006; b. NSTEMI mid July 2018 with medical management, LHC 7/18: LM 30%, pLAD 30%, mLAD 50%, dLAD 30%, D1-3 min irregs, mLCx 100% previously treated w/ PCI, OM1 min irregs, pRCA 20%, mRCA lesion-1 10%, mRCA lesion-2 70%, dRCA 50%, RPDA min irregs, post atrio 40%   . CKD (chronic kidney disease), stage III   . Colon polyps   . Diabetes mellitus with neuropathy ([place])   . DM2 (diabetes mellitus, type 2) (HCC)    insulin requiring  . Dyslipidemia   . GERD (gastroesophageal reflux disease)   . HTN (hypertension)   . Kidney stones   . Metabolic syndrome   . Myocardial infarction ([place])   . OSA (obstructive sleep apnea)   . 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 Recurrent UTI   . Renal artery stenosis (HCC)    L sided, stent 07/2013  . Rotator cuff disorder    right  . Skin cancer    head  . Systolic dysfunction    a. TTE 12/2016: EF 50-55%, mod concentric LVH, images inadequate for wall motion assessment, not technically sufficienct to allow for LV dias fxn, calcified mitral annulus   . Thyroid disorder     Past Surgical History:  Procedure Laterality Date  . adrenal adenoma removal  1990-   right  . CARPAL TUNNEL RELEASE     bilateral  . CORONARY ANGIOPLASTY WITH STENT PLACEMENT  2007   x 2  . KNEE SURGERY     right  . LEFT HEART CATH AND CORONARY ANGIOGRAPHY N/A 01/11/2017   Procedure: Left Heart Cath and Coronary Angiography;  Surgeon: Wellington Hampshire, MD;  Location: [facility];  Service: Cardiovascular;  Laterality: N/A;  . LUNG BIOPSY    . RENAL ARTERY STENT  2015   L  . SKIN CANCER EXCISION     back of head    Social History Social History  Substance Use Topics  . Smoking status: Never Smoker  . Smokeless tobacco: Never Used  . Alcohol use No    Family History Family History  Problem Relation Age of Onset  . Heart attack Father   . Heart attack Mother   . Heart attack Brother   . COPD Sister   . Arthritis Maternal Grandmother   . Breast cancer Maternal Aunt   . Diabetes Maternal Aunt   . Hypertension Other   . Hypertension Maternal Aunt     Allergies  Allergen Reactions  . Calcium Channel Blockers     Would avoid if possible due to h/o peripheral edema     REVIEW OF SYSTEMS (Negative unless checked)  Constitutional: _0 Weight loss  _1 Fever  _2 Chills Cardiac: _3 Chest pain   _4 Chest pressure   _5 Palpitations   _6 Shortness of breath when laying flat   _7 Shortness of breath at rest   _8 Shortness of breath with exertion. Vascular:  _9 Pain in legs with walking   _10 Pain in legs at rest   _11 Pain in legs when laying flat    _12 Claudication   _13 Pain in feet when walking  _14 Pain in feet at rest  _15 Pain in feet when laying flat   _16 History of DVT   _17 Phlebitis   _18 Swelling in legs   _19 Varicose veins   _20 Non-healing ulcers Pulmonary:   _21 Uses home oxygen   _22 Productive cough   _23 Hemoptysis   _24 Wheeze  _25   COPD   _0 Asthma Neurologic:  _1 Dizziness  _2 Blackouts   _3 Seizures   _4 History of stroke   _5 History of TIA  _6 Aphasia   _7 Temporary blindness   _8 Dysphagia   _9 Weakness or numbness in arms   _10 Weakness or numbness in legs Musculoskeletal:  _11 Arthritis   _12 Joint swelling   _13 Joint pain   _14 Low back pain Hematologic:  _15 Easy bruising  _16 Easy bleeding   _17 Hypercoagulable state   _18 Anemic   Gastrointestinal:  _19 Blood in stool   _20 Vomiting blood  _21 Gastroesophageal reflux/heartburn   _22 Abdominal pain Genitourinary:  _23 Chronic kidney disease   _24 Difficult urination  _25 Frequent urination  _26 Burning with urination   _27 Hematuria Skin:  _28 Rashes   _29 Ulcers   _30 Wounds Psychological:  _31 History of anxiety   _32  History of major depression.  Physical Examination  BP (!) 145/71   Pulse 61   Resp 16   Wt 262 lb 6.4 oz (119 kg)   BMI 43.67 kg/m  Gen:  WD/WN, NAD Head: [place]/AT, No temporalis wasting. Ear/Nose/Throat: Hearing grossly intact, nares w/o erythema or drainage, trachea midline Eyes: Conjunctiva clear. Sclera non-icteric Neck: Supple.  No JVD.  Pulmonary:  Good air movement, no use of accessory muscles.  Cardiac: RRR, normal S1, S2 Vascular:  Vessel Right Left  Radial Palpable Palpable                          PT Not Palpable Not Palpable  DP 1+ Palpable 1+ Palpable    Musculoskeletal: M/S 5/5 throughout.  No deformity or atrophy. 2+ BLE edema. Neurologic: Sensation grossly intact in extremities.  Symmetrical.  Speech is fluent.  Psychiatric: Judgment intact, Mood & affect appropriate for pt's clinical situation. Dermatologic: No rashes or ulcers noted.  No cellulitis or open wounds.       Labs Recent  Results (from the past 2160 hour(s))  Urinalysis, Complete w Microscopic     Status: Abnormal   Collection Time: 12/15/16  3:36 PM  Result Value Ref Range   Color, Urine YELLOW YELLOW   APPearance CLEAR CLEAR   Specific Gravity, Urine 1.025 1.005 - 1.030   pH 6.0 5.0 - 8.0   Glucose, UA NEGATIVE NEGATIVE mg/dL   Hgb urine dipstick NEGATIVE NEGATIVE   Bilirubin Urine NEGATIVE NEGATIVE   Ketones, ur NEGATIVE NEGATIVE mg/dL   Protein, ur >300 (A) NEGATIVE mg/dL   Nitrite NEGATIVE NEGATIVE   Leukocytes, UA NEGATIVE NEGATIVE   Squamous Epithelial / LPF 0-5 (A) NONE SEEN   WBC, UA 0-5 0 - 5 WBC/hpf   RBC / HPF NONE SEEN 0 - 5 RBC/hpf   Bacteria, UA NONE SEEN NONE SEEN  Basic metabolic panel     Status: Abnormal   Collection Time: 01/11/17  9:12 AM  Result Value Ref Range   Sodium 140 135 - 145 mmol/L   Potassium 3.3 (L) 3.5 - 5.1 mmol/L   Chloride 104 101 - 111 mmol/L   CO2 28 22 - 32 mmol/L   Glucose, Bld 165 (H) 65 - 99 mg/dL   BUN 27 (H) 6 - 20 mg/dL   Creatinine, Ser 1.88 (H) 0.61 - 1.24 mg/dL   Calcium 8.6 (L) 8.9 - 10.3 mg/dL   GFR calc non Af Amer 35 (L) >60 mL/min   GFR calc Af Amer 41 (L) >60 mL/min    Comment: (NOTE) The eGFR has been calculated using the CKD EPI equation. This calculation has not been validated in all clinical situations. eGFR's persistently <60 mL/min  signify possible Chronic Kidney Disease.    Anion gap 8 5 - 15  CBC     Status: Abnormal   Collection Time: 01/11/17  9:12 AM  Result Value Ref Range   WBC 7.9 3.8 - 10.6 K/uL   RBC 4.78 4.40 - 5.90 MIL/uL   Hemoglobin 13.6 13.0 - 18.0 g/dL   HCT 40.8 40.0 - 52.0 %   MCV 85.4 80.0 - 100.0 fL   MCH 28.5 26.0 - 34.0 pg   MCHC 33.4 32.0 - 36.0 g/dL   RDW 15.6 (H) 11.5 - 14.5 %   Platelets 185 150 - 440 K/uL  Troponin I     Status: Abnormal   Collection Time: 01/11/17  9:12 AM  Result Value Ref Range   Troponin I 2.38 (HH) <0.03 ng/mL    Comment: CRITICAL RESULT CALLED TO, READ BACK BY AND  VERIFIED WITH KAREN KUBECKA 01/11/17 0946 KLW   APTT     Status: None   Collection Time: 01/11/17 10:46 AM  Result Value Ref Range   aPTT 29 24 - 36 seconds  Protime-INR     Status: None   Collection Time: 01/11/17 10:46 AM  Result Value Ref Range   Prothrombin Time 13.5 11.4 - 15.2 seconds   INR 1.03   ECHOCARDIOGRAM COMPLETE     Status: None   Collection Time: 01/11/17  2:25 PM  Result Value Ref Range   Weight 4,192 oz   Height 65 in   BP 182/87 mmHg  Glucose, capillary     Status: Abnormal   Collection Time: 01/11/17  4:10 PM  Result Value Ref Range   Glucose-Capillary 169 (H) 65 - 99 mg/dL  Hemoglobin A1c     Status: Abnormal   Collection Time: 01/11/17  6:07 PM  Result Value Ref Range   Hgb A1c MFr Bld 7.0 (H) 4.8 - 5.6 %    Comment: (NOTE)         Pre-diabetes: 5.7 - 6.4         Diabetes: >6.4         Glycemic control for adults with diabetes: <7.0    Mean Plasma Glucose 154 mg/dL    Comment: (NOTE) Performed At: [facility] [place], [place] [id number] Lindon Romp MD JX:[id number]   MRSA PCR Screening     Status: None   Collection Time: 01/11/17  6:07 PM  Result Value Ref Range   MRSA by PCR NEGATIVE NEGATIVE    Comment:        The GeneXpert MRSA Assay (FDA approved for NASAL specimens only), is one component of a comprehensive MRSA colonization surveillance program. It is not intended to diagnose MRSA infection nor to guide or monitor treatment for MRSA infections.   Troponin I (q 6hr x 3)     Status: Abnormal   Collection Time: 01/11/17  6:07 PM  Result Value Ref Range   Troponin I 10.00 (HH) <0.03 ng/mL    Comment: CRITICAL VALUE NOTED. VALUE IS CONSISTENT WITH PREVIOUSLY REPORTED/CALLED VALUE [facility]  Magnesium     Status: None   Collection Time: 01/11/17  6:07 PM  Result Value Ref Range   Magnesium 1.8 1.7 - 2.4 mg/dL  Glucose, capillary     Status: Abnormal   Collection Time: 01/11/17 10:16 PM  Result Value Ref Range     Glucose-Capillary 258 (H) 65 - 99 mg/dL  Lipid panel     Status: Abnormal   Collection Time: 01/12/17  4:40  AM  Result Value Ref Range   Cholesterol 141 0 - 200 mg/dL   Triglycerides 160 (H) <150 mg/dL   HDL 22 (L) >40 mg/dL   Total CHOL/HDL Ratio 6.4 RATIO   VLDL 32 0 - 40 mg/dL   LDL Cholesterol 87 0 - 99 mg/dL    Comment:        Total Cholesterol/HDL:CHD Risk Coronary Heart Disease Risk Table                     Men   Women  1/2 Average Risk   3.4   3.3  Average Risk       5.0   4.4  2 X Average Risk   9.6   7.1  3 X Average Risk  23.4   11.0        Use the calculated Patient Ratio above and the CHD Risk Table to determine the patient's CHD Risk.        ATP III CLASSIFICATION (LDL):  <100     mg/dL   Optimal  100-129  mg/dL   Near or Above                    Optimal  130-159  mg/dL   Borderline  160-189  mg/dL   High  >190     mg/dL   Very High   Basic metabolic panel     Status: Abnormal   Collection Time: 01/12/17  4:40 AM  Result Value Ref Range   Sodium 135 135 - 145 mmol/L   Potassium 3.8 3.5 - 5.1 mmol/L   Chloride 99 (L) 101 - 111 mmol/L   CO2 28 22 - 32 mmol/L   Glucose, Bld 284 (H) 65 - 99 mg/dL   BUN 24 (H) 6 - 20 mg/dL   Creatinine, Ser 1.69 (H) 0.61 - 1.24 mg/dL   Calcium 8.7 (L) 8.9 - 10.3 mg/dL   GFR calc non Af Amer 40 (L) >60 mL/min   GFR calc Af Amer 46 (L) >60 mL/min    Comment: (NOTE) The eGFR has been calculated using the CKD EPI equation. This calculation has not been validated in all clinical situations. eGFR's persistently <60 mL/min signify possible Chronic Kidney Disease.    Anion gap 8 5 - 15  Glucose, capillary     Status: Abnormal   Collection Time: 01/12/17  7:13 AM  Result Value Ref Range   Glucose-Capillary 306 (H) 65 - 99 mg/dL  Glucose, capillary     Status: Abnormal   Collection Time: 01/12/17 11:33 AM  Result Value Ref Range   Glucose-Capillary 359 (H) 65 - 99 mg/dL  Glucose, capillary     Status: Abnormal    Collection Time: 01/12/17  4:46 PM  Result Value Ref Range   Glucose-Capillary 362 (H) 65 - 99 mg/dL   Comment 1 Notify RN    Comment 2 Document in Chart   Glucose, capillary     Status: Abnormal   Collection Time: 01/12/17 10:10 PM  Result Value Ref Range   Glucose-Capillary 305 (H) 65 - 99 mg/dL  Basic metabolic panel     Status: Abnormal   Collection Time: 01/13/17  6:51 AM  Result Value Ref Range   Sodium 134 (L) 135 - 145 mmol/L   Potassium 3.7 3.5 - 5.1 mmol/L   Chloride 96 (L) 101 - 111 mmol/L   CO2 28 22 - 32 mmol/L   Glucose, Bld 285 (H) 65 -  99 mg/dL   BUN 39 (H) 6 - 20 mg/dL   Creatinine, Ser 2.10 (H) 0.61 - 1.24 mg/dL   Calcium 8.7 (L) 8.9 - 10.3 mg/dL   GFR calc non Af Amer 31 (L) >60 mL/min   GFR calc Af Amer 36 (L) >60 mL/min    Comment: (NOTE) The eGFR has been calculated using the CKD EPI equation. This calculation has not been validated in all clinical situations. eGFR's persistently <60 mL/min signify possible Chronic Kidney Disease.    Anion gap 10 5 - 15  Glucose, capillary     Status: Abnormal   Collection Time: 01/13/17  8:20 AM  Result Value Ref Range   Glucose-Capillary 271 (H) 65 - 99 mg/dL  Glucose, capillary     Status: Abnormal   Collection Time: 01/13/17 12:20 PM  Result Value Ref Range   Glucose-Capillary 307 (H) 65 - 99 mg/dL  Urinalysis, Complete w Microscopic     Status: Abnormal   Collection Time: 01/15/17  5:26 PM  Result Value Ref Range   Color, Urine YELLOW YELLOW   APPearance HAZY (A) CLEAR   Specific Gravity, Urine 1.010 1.005 - 1.030   pH 5.0 5.0 - 8.0   Glucose, UA NEGATIVE NEGATIVE mg/dL   Hgb urine dipstick TRACE (A) NEGATIVE   Bilirubin Urine NEGATIVE NEGATIVE   Ketones, ur NEGATIVE NEGATIVE mg/dL   Protein, ur 100 (A) NEGATIVE mg/dL   Nitrite POSITIVE (A) NEGATIVE   Leukocytes, UA SMALL (A) NEGATIVE   Squamous Epithelial / LPF NONE SEEN NONE SEEN   WBC, UA 21-50 0 - 5 WBC/hpf   RBC / HPF 0-5 0 - 5 RBC/hpf   Bacteria,  UA MANY (A) NONE SEEN   WBC Clumps PRESENT   Urine culture     Status: Abnormal   Collection Time: 01/15/17  5:26 PM  Result Value Ref Range   Specimen Description URINE, CLEAN CATCH    Special Requests Normal    Culture >=100,000 COLONIES/mL ESCHERICHIA COLI (A)    Report Status 01/18/2017 FINAL    Organism ID, Bacteria ESCHERICHIA COLI (A)       Susceptibility   Escherichia coli - MIC*    AMPICILLIN 4 SENSITIVE Sensitive     CEFAZOLIN <=4 SENSITIVE Sensitive     CEFTRIAXONE <=1 SENSITIVE Sensitive     CIPROFLOXACIN <=0.25 SENSITIVE Sensitive     GENTAMICIN <=1 SENSITIVE Sensitive     IMIPENEM <=0.25 SENSITIVE Sensitive     NITROFURANTOIN <=16 SENSITIVE Sensitive     TRIMETH/SULFA <=20 SENSITIVE Sensitive     AMPICILLIN/SULBACTAM <=2 SENSITIVE Sensitive     PIP/TAZO <=4 SENSITIVE Sensitive     Extended ESBL NEGATIVE Sensitive     * >=100,000 COLONIES/mL ESCHERICHIA COLI  Urinalysis, Complete w Microscopic     Status: Abnormal   Collection Time: 01/15/17  7:36 PM  Result Value Ref Range   Color, Urine YELLOW (A) YELLOW   APPearance TURBID (A) CLEAR   Specific Gravity, Urine 1.014 1.005 - 1.030   pH 5.0 5.0 - 8.0   Glucose, UA NEGATIVE NEGATIVE mg/dL   Hgb urine dipstick MODERATE (A) NEGATIVE   Bilirubin Urine NEGATIVE NEGATIVE   Ketones, ur NEGATIVE NEGATIVE mg/dL   Protein, ur 100 (A) NEGATIVE mg/dL   Nitrite NEGATIVE NEGATIVE   Leukocytes, UA LARGE (A) NEGATIVE   RBC / HPF TOO NUMEROUS TO COUNT 0 - 5 RBC/hpf   WBC, UA TOO NUMEROUS TO COUNT 0 - 5 WBC/hpf   Bacteria, UA MANY (  A) NONE SEEN   Squamous Epithelial / LPF 0-5 (A) NONE SEEN   WBC Clumps PRESENT    Mucus PRESENT    Non Squamous Epithelial 0-5 (A) NONE SEEN  Urine culture     Status: Abnormal   Collection Time: 01/15/17  7:36 PM  Result Value Ref Range   Specimen Description URINE, RANDOM    Special Requests NONE    Culture >=100,000 COLONIES/mL ESCHERICHIA COLI (A)    Report Status 01/18/2017 FINAL     Organism ID, Bacteria ESCHERICHIA COLI (A)       Susceptibility   Escherichia coli - MIC*    AMPICILLIN 4 SENSITIVE Sensitive     CEFAZOLIN <=4 SENSITIVE Sensitive     CEFTRIAXONE <=1 SENSITIVE Sensitive     CIPROFLOXACIN <=0.25 SENSITIVE Sensitive     GENTAMICIN <=1 SENSITIVE Sensitive     IMIPENEM <=0.25 SENSITIVE Sensitive     NITROFURANTOIN <=16 SENSITIVE Sensitive     TRIMETH/SULFA <=20 SENSITIVE Sensitive     AMPICILLIN/SULBACTAM <=2 SENSITIVE Sensitive     PIP/TAZO <=4 SENSITIVE Sensitive     Extended ESBL NEGATIVE Sensitive     * >=100,000 COLONIES/mL ESCHERICHIA COLI  Lactic acid, plasma     Status: None   Collection Time: 01/15/17  7:59 PM  Result Value Ref Range   Lactic Acid, Venous 1.0 0.5 - 1.9 mmol/L  Comprehensive metabolic panel     Status: Abnormal   Collection Time: 01/15/17  7:59 PM  Result Value Ref Range   Sodium 133 (L) 135 - 145 mmol/L   Potassium 3.2 (L) 3.5 - 5.1 mmol/L   Chloride 95 (L) 101 - 111 mmol/L   CO2 25 22 - 32 mmol/L   Glucose, Bld 106 (H) 65 - 99 mg/dL   BUN 63 (H) 6 - 20 mg/dL   Creatinine, Ser 3.07 (H) 0.61 - 1.24 mg/dL   Calcium 8.7 (L) 8.9 - 10.3 mg/dL   Total Protein 7.6 6.5 - 8.1 g/dL   Albumin 3.1 (L) 3.5 - 5.0 g/dL   AST 80 (H) 15 - 41 U/L   ALT 68 (H) 17 - 63 U/L   Alkaline Phosphatase 80 38 - 126 U/L   Total Bilirubin 1.3 (H) 0.3 - 1.2 mg/dL   GFR calc non Af Amer 19 (L) >60 mL/min   GFR calc Af Amer 22 (L) >60 mL/min    Comment: (NOTE) The eGFR has been calculated using the CKD EPI equation. This calculation has not been validated in all clinical situations. eGFR's persistently <60 mL/min signify possible Chronic Kidney Disease.    Anion gap 13 5 - 15  Lipase, blood     Status: None   Collection Time: 01/15/17  7:59 PM  Result Value Ref Range   Lipase 20 11 - 51 U/L  Troponin I     Status: Abnormal   Collection Time: 01/15/17  7:59 PM  Result Value Ref Range   Troponin I 16.35 (HH) <0.03 ng/mL    Comment: CRITICAL  VALUE NOTED. VALUE IS CONSISTENT WITH PREVIOUSLY REPORTED/CALLED VALUE [place]  CBC WITH DIFFERENTIAL     Status: Abnormal   Collection Time: 01/15/17  7:59 PM  Result Value Ref Range   WBC 23.1 (H) 3.8 - 10.6 K/uL   RBC 4.75 4.40 - 5.90 MIL/uL   Hemoglobin 13.8 13.0 - 18.0 g/dL   HCT 40.5 40.0 - 52.0 %   MCV 85.2 80.0 - 100.0 fL   MCH 29.0 26.0 - 34.0 pg  MCHC 34.0 32.0 - 36.0 g/dL   RDW 14.9 (H) 11.5 - 14.5 %   Platelets 275 150 - 440 K/uL   Neutrophils Relative % 87 %   Lymphocytes Relative 3 %   Monocytes Relative 10 %   Eosinophils Relative 0 %   Basophils Relative 0 %   Neutro Abs 20.1 (H) 1.4 - 6.5 K/uL   Lymphs Abs 0.7 (L) 1.0 - 3.6 K/uL   Monocytes Absolute 2.3 (H) 0.2 - 1.0 K/uL   Eosinophils Absolute 0.0 0 - 0.7 K/uL   Basophils Absolute 0.0 0 - 0.1 K/uL  Procalcitonin     Status: None   Collection Time: 01/15/17  7:59 PM  Result Value Ref Range   Procalcitonin 0.86 ng/mL    Comment:        Interpretation: PCT > 0.5 ng/mL and <= 2 ng/mL: Systemic infection (sepsis) is possible, but other conditions are known to elevate PCT as well. (NOTE)         ICU PCT Algorithm               Non ICU PCT Algorithm    ----------------------------     ------------------------------         PCT < 0.25 ng/mL                 PCT < 0.1 ng/mL     Stopping of antibiotics            Stopping of antibiotics       strongly encouraged.               strongly encouraged.    ----------------------------     ------------------------------       PCT level decrease by               PCT < 0.25 ng/mL       >= 80% from peak PCT       OR PCT 0.25 - 0.5 ng/mL          Stopping of antibiotics                                             encouraged.     Stopping of antibiotics           encouraged.    ----------------------------     ------------------------------       PCT level decrease by              PCT >= 0.25 ng/mL       < 80% from peak PCT        AND PCT >= 0.5 ng/mL             Continuing  antibiotics                                              encouraged.       Continuing antibiotics            encouraged.    ----------------------------     ------------------------------     PCT level increase compared          PCT > 0.5 ng/mL         with peak PCT AND  PCT >= 0.5 ng/mL             Escalation of antibiotics                                          strongly encouraged.      Escalation of antibiotics        strongly encouraged.   APTT     Status: None   Collection Time: 01/15/17  7:59 PM  Result Value Ref Range   aPTT 31 24 - 36 seconds  Protime-INR     Status: None   Collection Time: 01/15/17  7:59 PM  Result Value Ref Range   Prothrombin Time 14.5 11.4 - 15.2 seconds   INR 1.12   Blood Culture (routine x 2)     Status: None   Collection Time: 01/15/17  7:59 PM  Result Value Ref Range   Specimen Description BLOOD BLOOD LEFT FOREARM    Special Requests      BOTTLES DRAWN AEROBIC AND ANAEROBIC Blood Culture results may not be optimal due to an excessive volume of blood received in culture bottles   Culture NO GROWTH 5 DAYS    Report Status 01/20/2017 FINAL   Blood Culture (routine x 2)     Status: Abnormal   Collection Time: 01/15/17  7:59 PM  Result Value Ref Range   Specimen Description BLOOD BLOOD RIGHT ARM    Special Requests      BOTTLES DRAWN AEROBIC AND ANAEROBIC Blood Culture adequate volume   Culture  Setup Time      GRAM NEGATIVE RODS AEROBIC BOTTLE ONLY CRITICAL RESULT CALLED TO, READ BACK BY AND VERIFIED WITH: KAREN HAYES 01/16/17 1427 KLW Performed at [facility], [place] [address] [place], [place] [address]    Culture ESCHERICHIA COLI (A)    Report Status 01/18/2017 FINAL    Organism ID, Bacteria ESCHERICHIA COLI       Susceptibility   Escherichia coli - MIC*    AMPICILLIN 4 SENSITIVE Sensitive     CEFAZOLIN <=4 SENSITIVE Sensitive     CEFEPIME <=1 SENSITIVE Sensitive     CEFTAZIDIME <=1 SENSITIVE Sensitive     CEFTRIAXONE <=1  SENSITIVE Sensitive     CIPROFLOXACIN <=0.25 SENSITIVE Sensitive     GENTAMICIN <=1 SENSITIVE Sensitive     IMIPENEM <=0.25 SENSITIVE Sensitive     TRIMETH/SULFA <=20 SENSITIVE Sensitive     AMPICILLIN/SULBACTAM <=2 SENSITIVE Sensitive     PIP/TAZO <=4 SENSITIVE Sensitive     Extended ESBL NEGATIVE Sensitive     * ESCHERICHIA COLI  Blood Culture ID Panel (Reflexed)     Status: Abnormal   Collection Time: 01/15/17  7:59 PM  Result Value Ref Range   Enterococcus species NOT DETECTED NOT DETECTED   Listeria monocytogenes NOT DETECTED NOT DETECTED   Staphylococcus species NOT DETECTED NOT DETECTED   Staphylococcus aureus NOT DETECTED NOT DETECTED   Streptococcus species NOT DETECTED NOT DETECTED   Streptococcus agalactiae NOT DETECTED NOT DETECTED   Streptococcus pneumoniae NOT DETECTED NOT DETECTED   Streptococcus pyogenes NOT DETECTED NOT DETECTED   Acinetobacter baumannii NOT DETECTED NOT DETECTED   Enterobacteriaceae species DETECTED (A) NOT DETECTED    Comment: Enterobacteriaceae represent a large family of gram-negative bacteria, not a single organism. CRITICAL RESULT CALLED TO, READ BACK BY AND VERIFIED WITH: KAREN HAYES 01/16/17 1427 KLW    Enterobacter  cloacae complex NOT DETECTED NOT DETECTED   Escherichia coli DETECTED (A) NOT DETECTED    Comment: CRITICAL RESULT CALLED TO, READ BACK BY AND VERIFIED WITH: KAREN HAYES 01/16/17 1427 KLW    Klebsiella oxytoca NOT DETECTED NOT DETECTED   Klebsiella pneumoniae NOT DETECTED NOT DETECTED   Proteus species NOT DETECTED NOT DETECTED   Serratia marcescens NOT DETECTED NOT DETECTED   Carbapenem resistance NOT DETECTED NOT DETECTED   Haemophilus influenzae NOT DETECTED NOT DETECTED   Neisseria meningitidis NOT DETECTED NOT DETECTED   Pseudomonas aeruginosa NOT DETECTED NOT DETECTED   Candida albicans NOT DETECTED NOT DETECTED   Candida glabrata NOT DETECTED NOT DETECTED   Candida krusei NOT DETECTED NOT DETECTED   Candida  parapsilosis NOT DETECTED NOT DETECTED   Candida tropicalis NOT DETECTED NOT DETECTED  Glucose, capillary     Status: Abnormal   Collection Time: 01/16/17  1:31 AM  Result Value Ref Range   Glucose-Capillary 198 (H) 65 - 99 mg/dL  Troponin I     Status: Abnormal   Collection Time: 01/16/17  1:50 AM  Result Value Ref Range   Troponin I 8.01 (HH) <0.03 ng/mL    Comment: CRITICAL VALUE NOTED. VALUE IS CONSISTENT WITH PREVIOUSLY REPORTED/CALLED VALUE. QSD  Basic metabolic panel     Status: Abnormal   Collection Time: 01/16/17  1:50 AM  Result Value Ref Range   Sodium 136 135 - 145 mmol/L   Potassium 3.4 (L) 3.5 - 5.1 mmol/L   Chloride 100 (L) 101 - 111 mmol/L   CO2 25 22 - 32 mmol/L   Glucose, Bld 196 (H) 65 - 99 mg/dL   BUN 56 (H) 6 - 20 mg/dL   Creatinine, Ser 2.66 (H) 0.61 - 1.24 mg/dL   Calcium 8.2 (L) 8.9 - 10.3 mg/dL   GFR calc non Af Amer 23 (L) >60 mL/min   GFR calc Af Amer 27 (L) >60 mL/min    Comment: (NOTE) The eGFR has been calculated using the CKD EPI equation. This calculation has not been validated in all clinical situations. eGFR's persistently <60 mL/min signify possible Chronic Kidney Disease.    Anion gap 11 5 - 15  CBC     Status: Abnormal   Collection Time: 01/16/17  1:50 AM  Result Value Ref Range   WBC 20.4 (H) 3.8 - 10.6 K/uL   RBC 4.67 4.40 - 5.90 MIL/uL   Hemoglobin 13.1 13.0 - 18.0 g/dL   HCT 39.6 (L) 40.0 - 52.0 %   MCV 84.6 80.0 - 100.0 fL   MCH 28.1 26.0 - 34.0 pg   MCHC 33.2 32.0 - 36.0 g/dL   RDW 14.8 (H) 11.5 - 14.5 %   Platelets 249 150 - 440 K/uL  Glucose, capillary     Status: Abnormal   Collection Time: 01/16/17  7:54 AM  Result Value Ref Range   Glucose-Capillary 242 (H) 65 - 99 mg/dL  Troponin I     Status: Abnormal   Collection Time: 01/16/17  8:44 AM  Result Value Ref Range   Troponin I 5.50 (HH) <0.03 ng/mL    Comment: CRITICAL VALUE NOTED. VALUE IS CONSISTENT WITH PREVIOUSLY REPORTED/CALLED VALUE...HKP  Glucose, capillary      Status: Abnormal   Collection Time: 01/16/17 12:05 PM  Result Value Ref Range   Glucose-Capillary 427 (H) 65 - 99 mg/dL  Glucose, capillary     Status: Abnormal   Collection Time: 01/16/17 12:10 PM  Result Value Ref Range   Glucose-Capillary  389 (H) 65 - 99 mg/dL  Troponin I     Status: Abnormal   Collection Time: 01/16/17  2:03 PM  Result Value Ref Range   Troponin I 4.91 (HH) <0.03 ng/mL    Comment: CRITICAL RESULT CALLED TO, READ BACK BY AND VERIFIED WITH TAMMY TODD ON 01/16/17 AT 1533 BY KLW   Magnesium     Status: None   Collection Time: 01/16/17  2:03 PM  Result Value Ref Range   Magnesium 1.9 1.7 - 2.4 mg/dL  Glucose, capillary     Status: Abnormal   Collection Time: 01/16/17  4:35 PM  Result Value Ref Range   Glucose-Capillary 386 (H) 65 - 99 mg/dL  Glucose, capillary     Status: Abnormal   Collection Time: 01/16/17  8:40 PM  Result Value Ref Range   Glucose-Capillary 400 (H) 65 - 99 mg/dL  Basic metabolic panel     Status: Abnormal   Collection Time: 01/17/17  5:02 AM  Result Value Ref Range   Sodium 137 135 - 145 mmol/L   Potassium 3.6 3.5 - 5.1 mmol/L   Chloride 103 101 - 111 mmol/L   CO2 26 22 - 32 mmol/L   Glucose, Bld 313 (H) 65 - 99 mg/dL   BUN 43 (H) 6 - 20 mg/dL   Creatinine, Ser 2.24 (H) 0.61 - 1.24 mg/dL   Calcium 7.9 (L) 8.9 - 10.3 mg/dL   GFR calc non Af Amer 28 (L) >60 mL/min   GFR calc Af Amer 33 (L) >60 mL/min    Comment: (NOTE) The eGFR has been calculated using the CKD EPI equation. This calculation has not been validated in all clinical situations. eGFR's persistently <60 mL/min signify possible Chronic Kidney Disease.    Anion gap 8 5 - 15  Glucose, capillary     Status: Abnormal   Collection Time: 01/17/17  7:51 AM  Result Value Ref Range   Glucose-Capillary 288 (H) 65 - 99 mg/dL  Glucose, capillary     Status: Abnormal   Collection Time: 01/17/17 11:45 AM  Result Value Ref Range   Glucose-Capillary 357 (H) 65 - 99 mg/dL   Comment 1  Notify RN    Comment 2 Document in Chart   Glucose, capillary     Status: Abnormal   Collection Time: 01/17/17  4:29 PM  Result Value Ref Range   Glucose-Capillary 202 (H) 65 - 99 mg/dL  Glucose, capillary     Status: Abnormal   Collection Time: 01/17/17  9:35 PM  Result Value Ref Range   Glucose-Capillary 122 (H) 65 - 99 mg/dL   Comment 1 Notify RN    Comment 2 Document in Chart   CBC     Status: Abnormal   Collection Time: 01/18/17  3:48 AM  Result Value Ref Range   WBC 12.8 (H) 3.8 - 10.6 K/uL   RBC 4.50 4.40 - 5.90 MIL/uL   Hemoglobin 12.7 (L) 13.0 - 18.0 g/dL   HCT 38.3 (L) 40.0 - 52.0 %   MCV 85.0 80.0 - 100.0 fL   MCH 28.3 26.0 - 34.0 pg   MCHC 33.2 32.0 - 36.0 g/dL   RDW 14.9 (H) 11.5 - 14.5 %   Platelets 278 150 - 440 K/uL  Basic metabolic panel     Status: Abnormal   Collection Time: 01/18/17  3:48 AM  Result Value Ref Range   Sodium 138 135 - 145 mmol/L   Potassium 4.0 3.5 - 5.1 mmol/L   Chloride  106 101 - 111 mmol/L   CO2 26 22 - 32 mmol/L   Glucose, Bld 237 (H) 65 - 99 mg/dL   BUN 38 (H) 6 - 20 mg/dL   Creatinine, Ser 2.01 (H) 0.61 - 1.24 mg/dL   Calcium 8.2 (L) 8.9 - 10.3 mg/dL   GFR calc non Af Amer 30 (L) >60 mL/min   GFR calc Af Amer 35 (L) >60 mL/min    Comment: (NOTE) The eGFR has been calculated using the CKD EPI equation. This calculation has not been validated in all clinical situations. eGFR's persistently <60 mL/min signify possible Chronic Kidney Disease.    Anion gap 6 5 - 15  Glucose, capillary     Status: Abnormal   Collection Time: 01/18/17  7:37 AM  Result Value Ref Range   Glucose-Capillary 254 (H) 65 - 99 mg/dL  Glucose, capillary     Status: Abnormal   Collection Time: 01/18/17 11:54 AM  Result Value Ref Range   Glucose-Capillary 258 (H) 65 - 99 mg/dL  Basic metabolic panel     Status: Abnormal   Collection Time: 01/22/17  4:03 PM  Result Value Ref Range   Sodium 137 135 - 145 mEq/L   Potassium 4.8 3.5 - 5.1 mEq/L   Chloride  104 96 - 112 mEq/L   CO2 26 19 - 32 mEq/L   Glucose, Bld 306 (H) 70 - 99 mg/dL   BUN 23 6 - 23 mg/dL   Creatinine, Ser 0.07 (H) 0.40 - 1.50 mg/dL   Calcium 8.4 8.4 - 12.1 mg/dL   GFR 97.58 (L) >83.25 mL/min  CBC with Differential/Platelet     Status: Abnormal   Collection Time: 01/22/17  4:03 PM  Result Value Ref Range   WBC 10.9 (H) 4.0 - 10.5 K/uL   RBC 4.12 (L) 4.22 - 5.81 Mil/uL   Hemoglobin 11.8 (L) 13.0 - 17.0 g/dL   HCT 49.8 (L) 26.4 - 15.8 %   MCV 88.5 78.0 - 100.0 fl   MCHC 32.3 30.0 - 36.0 g/dL   RDW 30.9 40.7 - 68.0 %   Platelets 344.0 150.0 - 400.0 K/uL   Neutrophils Relative % 74.5 43.0 - 77.0 %   Lymphocytes Relative 11.4 (L) 12.0 - 46.0 %   Monocytes Relative 10.2 3.0 - 12.0 %   Eosinophils Relative 2.7 0.0 - 5.0 %   Basophils Relative 1.2 0.0 - 3.0 %   Neutro Abs 8.1 (H) 1.4 - 7.7 K/uL   Lymphs Abs 1.2 0.7 - 4.0 K/uL   Monocytes Absolute 1.1 (H) 0.1 - 1.0 K/uL   Eosinophils Absolute 0.3 0.0 - 0.7 K/uL   Basophils Absolute 0.1 0.0 - 0.1 K/uL  Urine Culture     Status: None   Collection Time: 01/30/17  8:11 AM  Result Value Ref Range   Organism ID, Bacteria      Three or more organisms present,each greater than 10,000 CFU/mL.These organisms,commonly found on external and internal genitalia,are considered to be colonizers.No further testing performed.   Basic Metabolic Panel     Status: Abnormal   Collection Time: 01/30/17  8:11 AM  Result Value Ref Range   Sodium 138 135 - 145 mEq/L   Potassium 3.5 3.5 - 5.1 mEq/L   Chloride 100 96 - 112 mEq/L   CO2 31 19 - 32 mEq/L   Glucose, Bld 159 (H) 70 - 99 mg/dL   BUN 24 (H) 6 - 23 mg/dL   Creatinine, Ser 8.81 (H) 0.40 - 1.50 mg/dL  Calcium 8.6 8.4 - 10.5 mg/dL   GFR 41.84 (L) >60.00 mL/min  Basic metabolic panel     Status: Abnormal   Collection Time: 02/01/17 10:06 AM  Result Value Ref Range   Glucose 210 (H) 65 - 99 mg/dL   BUN 20 8 - 27 mg/dL   Creatinine, Ser 1.60 (H) 0.76 - 1.27 mg/dL   GFR calc non  Af Amer 44 (L) >59 mL/min/1.73   GFR calc Af Amer 50 (L) >59 mL/min/1.73   BUN/Creatinine Ratio 13 10 - 24   Sodium 138 134 - 144 mmol/L   Potassium 3.9 3.5 - 5.2 mmol/L   Chloride 100 96 - 106 mmol/L   CO2 25 20 - 29 mmol/L   Calcium 8.8 8.6 - 10.2 mg/dL  Urine Culture     Status: None   Collection Time: 02/16/17  4:18 PM  Result Value Ref Range   Culture SERRATIA MARCESCENS    Colony Count 10,000-50,000 CFU/mL    Organism ID, Bacteria SERRATIA MARCESCENS       Susceptibility   Serratia marcescens -  (no method available)    AMOX/CLAVULANIC  Resistant     CEFAZOLIN >=64 Resistant     CEFTRIAXONE <=1 Sensitive     CEFTAZIDIME <=1 Sensitive     CEFEPIME <=1 Sensitive     GENTAMICIN <=1 Sensitive     TOBRAMYCIN <=1 Sensitive     CIPROFLOXACIN <=0.25 Sensitive     LEVOFLOXACIN <=0.12 Sensitive     NITROFURANTOIN 128 Resistant     TRIMETH/SULFA* <=20 Sensitive      * NR=NOT REPORTABLE,SEE COMMENTORAL therapy:A cefazolin MIC of <32 predicts susceptibility to the oral agents cefaclor,cefdinir,cefpodoxime,cefprozil,cefuroxime,cephalexin,and loracarbef when used for therapy of uncomplicated UTIs due to E.coli,K.pneumomiae,and P.mirabilis. PARENTERAL therapy: A cefazolinMIC of >8 indicates resistance to parenteralcefazolin. An alternate test method must beperformed to confirm susceptibility to parenteralcefazolin.  Urine Culture     Status: None   Collection Time: 02/27/17 11:47 AM  Result Value Ref Range   Organism ID, Bacteria      Multiple organisms present,each less than 10,000 CFU/mL. These organisms,commonly found on external and internal genitalia,are considered colonizers. No further testing performed.   Basic metabolic panel     Status: Abnormal   Collection Time: 03/06/17 10:25 AM  Result Value Ref Range   Sodium 139 135 - 145 mEq/L   Potassium 3.6 3.5 - 5.1 mEq/L   Chloride 101 96 - 112 mEq/L   CO2 28 19 - 32 mEq/L   Glucose, Bld 118 (H) 70 - 99 mg/dL   BUN 30 (H) 6 -  23 mg/dL   Creatinine, Ser 2.39 (H) 0.40 - 1.50 mg/dL   Calcium 9.0 8.4 - 10.5 mg/dL   GFR 28.81 (L) >60.00 mL/min  Hemoglobin A1c     Status: Abnormal   Collection Time: 03/06/17 10:25 AM  Result Value Ref Range   Hgb A1c MFr Bld 8.0 (H) 4.6 - 6.5 %    Comment: Glycemic Control Guidelines for People with Diabetes:Non Diabetic:  <6%Goal of Therapy: <7%Additional Action Suggested:  >5%   Basic Metabolic Panel     Status: Abnormal   Collection Time: 03/12/17 12:26 PM  Result Value Ref Range   Sodium 139 135 - 145 mEq/L   Potassium 3.8 3.5 - 5.1 mEq/L   Chloride 101 96 - 112 mEq/L   CO2 29 19 - 32 mEq/L   Glucose, Bld 204 (H) 70 - 99 mg/dL   BUN 22 6 - 23 mg/dL  Creatinine, Ser 1.77 (H) 0.40 - 1.50 mg/dL   Calcium 9.2 8.4 - 10.5 mg/dL   GFR 40.73 (L) >60.00 mL/min    Radiology No results found.    Assessment/Plan  Renal artery stenosis, native Has not been checked in some.  Will check duplex at his next visit.   Essential hypertension, benign blood pressure control important in reducing the progression of atherosclerotic disease. On appropriate oral medications.   Type 2 diabetes mellitus with renal complication (HCC) blood glucose control important in reducing the progression of atherosclerotic disease. Also, involved in wound healing. On appropriate medications.   Chronic kidney disease, stage III (moderate) Likely contributes to LE swelling  Lymphedema I do believe he would benefit from a lymphedema pump and his heart failure does not appear severe enough to be a contraindication to this. I would recommend he began using lymphedema pump daily or twice daily. Continue wearing compression stockings, elevating his legs, and exercising with the hopes of losing weight. Return to clinic in 3-4 months.    Leotis Pain, MD  03/13/2017 11:36 AM    This note was created with Dragon medical transcription system.  Any errors from dictation are purely unintentional

## 2017-03-13 NOTE — Assessment & Plan Note (Signed)
I do believe he would benefit from a lymphedema pump and his heart failure does not appear severe enough to be a contraindication to this. I would recommend he began using lymphedema pump daily or twice daily. Continue wearing compression stockings, elevating his legs, and exercising with the hopes of losing weight. Return to clinic in 3-4 months.

## 2017-03-13 NOTE — Assessment & Plan Note (Signed)
Symptoms resolved 

## 2017-03-13 NOTE — Assessment & Plan Note (Signed)
No adverse effect on medication. Continue when necessary use at night.

## 2017-03-19 ENCOUNTER — Telehealth: Payer: Self-pay

## 2017-03-19 MED ORDER — SULFAMETHOXAZOLE-TRIMETHOPRIM 800-160 MG PO TABS: 1.0000 | ORAL_TABLET | Freq: Two times a day (BID) | ORAL | 0 refills | Status: DC

## 2017-03-19 NOTE — Telephone Encounter (Signed)
Restart abx, rx sent.  Keep OV with urology.  If worse in the meantime then let me know.  Thanks.

## 2017-03-19 NOTE — Telephone Encounter (Signed)
Pt left /vm; pt seen 03/12/17; pt started burning upon urination again on 03/18/17. Pt wants to know if med could be sent to [place] or would pt have to be seen. Pt request cb.

## 2017-03-19 NOTE — Telephone Encounter (Signed)
Wife advised. 

## 2017-03-22 ENCOUNTER — Encounter: Payer: Self-pay | Admitting: *Deleted

## 2017-03-22 ENCOUNTER — Other Ambulatory Visit: Payer: Self-pay | Admitting: *Deleted

## 2017-03-22 NOTE — Patient Outreach (Signed)
[facility]) Care Management   03/22/2017  Stephen [name] 20-Feb-1946 [id number]  Stephen [name] is an 72 y.o. male  Subjective:  Pt reports on recent PCP visit for UTI, continues with antibiotic, burning better, to Follow up with Urologist 03/30/17.   Pt reports on recent visit at HF clinic- received a good report. Pt reports continues with leg edema, Stephen [name] suppose to call for approval of compression pump,  When obtain to do a couple times a day.   Pt reports no complaints of sob,chest pain, taking  Talks.  Pt reports weights ranges 251-253 lbs, today 252 lbs, adherent with Low Na+ diet.  Spouse Reports pt's  sugar today was 194 after eating, has been up with UTI, before was in 140's and  Lower.     Objective:   Vitals:   03/22/17 1311  BP: (!) 128/56  Pulse: (!) 57  Resp: 16  SpO2: 96%    ROS  Physical Exam  Constitutional: He is oriented to person, place, and time. He appears well-developed and well-nourished.  Cardiovascular: Regular rhythm and normal heart sounds.   HR 57   Respiratory: Effort normal and breath sounds normal.  GI: Soft.  Musculoskeletal: Normal range of motion. He exhibits edema.  Trace edema top of left foot/+1 top of right foot/+2 to bilateral lower extremities.   Neurological: He is alert and oriented to person, place, and time.  Skin: Skin is warm and dry.  Psychiatric: He has a normal mood and affect. His behavior is normal. Judgment and thought content normal.    Encounter Medications:   Outpatient Encounter Prescriptions as of 03/22/2017  Medication Sig Note  . ACCU-CHEK SOFTCLIX LANCETS lancets Use as instructed to check blood sugar three times daily and as needed.  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   Marland Kitchen amLODipine (NORVASC) 5 MG tablet Take 1 tablet (5 mg total) by mouth daily.   Marland Kitchen aspirin 81 MG tablet Take 324 mg by mouth daily.  01/12/2017: Pt states he takes 4 tabs daily  . atorvastatin (LIPITOR) 80 MG tablet TAKE 1  TABLET AT BEDTIME   . Blood Glucose Monitoring Suppl (ACCU-CHEK AVIVA PLUS) w/Device KIT Use to check blood sugar three times daily and as needed.  Diagnosis: E11.22  Insulin-dependent   . carvedilol (COREG) 25 MG tablet TAKE 1 TABLET TWICE DAILY WITH A MEAL   . cloNIDine (CATAPRES) 0.3 MG tablet TAKE 1 TABLET THREE TIMES DAILY   . clopidogrel (PLAVIX) 75 MG tablet Take 1 tablet (75 mg total) by mouth daily.   . diphenhydrAMINE (BENADRYL) 50 MG tablet Take 50 mg by mouth at bedtime as needed. Pt takes every night 03/22/2017: As needed.   . docusate sodium (COLACE) 100 MG capsule Take 100 mg by mouth 2 (two) times daily. As needed. 02/02/2017: As needed.   . gabapentin (NEURONTIN) 800 MG tablet TAKE 1 TABLET IN THE MORNING  AND TAKE 1 AND 1/2 TABLETS AT BEDTIME   . glucose blood (ACCU-CHEK AVIVA PLUS) test strip Use as instructed to check blood sugar 3 times daily or as needed.  Diagnosis:  E11.22  Insulin-dependent.   . hydrALAZINE (APRESOLINE) 50 MG tablet Take 1 tablet (50 mg total) by mouth 3 (three) times daily.   . insulin NPH-regular Human (NOVOLIN 70/30) (70-30) 100 UNIT/ML injection INJECT  70 UNITS SUBCUTANEOUSLY EVERY MORNING  AND  50 units at bedtime   . Insulin Syringe-Needle U-100 (INSULIN SYRINGE 1CC/30GX5/16") 30G X 5/16" 1 ML MISC Use as  directed to take insulin Dx 250.62   . isosorbide mononitrate (IMDUR) 30 MG 24 hr tablet Take 3 tablets (90 mg total) by mouth 2 (two) times daily. 03/22/2017: Per pt, taking 1.5 tablets (45 mg total ) twice a day   . NON FORMULARY Oxygen 2 liters 24/7 03/22/2017: As needed.   Marland Kitchen omeprazole (PRILOSEC) 20 MG capsule Take 20 mg by mouth daily.     Marland Kitchen oxyCODONE (ROXICODONE) 15 MG immediate release tablet Take 1 tablet (15 mg total) by mouth every evening. 03/22/2017: As needed.   . sulfamethoxazole-trimethoprim (BACTRIM DS,SEPTRA DS) 800-160 MG tablet Take 1 tablet by mouth 2 (two) times daily.   Marland Kitchen torsemide (DEMADEX) 20 MG tablet alternate '40mg'$  and '20mg'$  daily.  03/22/2017: Per pt does not take on Sundays per Heart MD.   . nitroGLYCERIN (NITROSTAT) 0.4 MG SL tablet Place 1 tablet (0.4 mg total) under the tongue every 5 (five) minutes as needed. May repeat x3 (Patient not taking: Reported on 03/22/2017) 02/02/2017: Available if needed.    No facility-administered encounter medications on file as of 03/22/2017.     Functional Status:   In your present state of health, do you have any difficulty performing the following activities: 02/27/2017 02/02/2017  Hearing? N N  Vision? N N  Difficulty concentrating or making decisions? N N  Walking or climbing stairs? N N  Dressing or bathing? N N  Doing errands, shopping? N Y  Conservation officer, nature and eating ? - N  Using the Toilet? - N  In the past six months, have you accidently leaked urine? - N  Do you have problems with loss of bowel control? - N  Managing your Medications? - N  Managing your Finances? - N  Housekeeping or managing your Housekeeping? - Y  Some recent data might be hidden    Fall/Depression Screening:    Fall Risk  02/27/2017 02/02/2017 01/31/2017  Falls in the past year? Yes Yes Yes  Number falls in past yr: '1 1 1  '$ Injury with Fall? No No No  Follow up Falls prevention discussed - Falls prevention discussed   PHQ 2/9 Scores 02/27/2017 02/02/2017 01/31/2017 08/31/2016  PHQ - 2 Score 0 0 0 0    Assessment:  Pleasant 71 year old male, resides with spouse/present during home visit.    Followed pt for transition of care/recent hospitalization July 16-19,2018 for UTI, acute renal    Insufficiency, elevated Troponin.    HF:  Per pt- weights range 251-253 lbs, today 252 lbs.   Trace edema to top of left foot/+1 top of right     Foot, +2 bilateral lower extremities.   DM:  Sugar today 194 after eating, been in 140's or lower prior to UTI.  UTI:  Pt continues to take antibiotic as ordered, to follow up with Urologist 03/30/17.    Plan:  As discussed with pt and spouse, plan to [facility]- pt's goals             Met.           Plan to inform Stephen [name] of discharge, send case closure letter.            Plan to inform [facility] care management assistant to close case.   [facility] CM Care Plan Problem Two     Most Recent Value  Care Plan Problem Two  HF- self management   Role Documenting the Problem Two  Care Management Coordinator  Care Plan for  Problem Two  Active  [facility] Long Term Goal  Pt would have no issues with HF within the next 35 days   [facility] Long Term Goal Start Date  02/19/17  [facility] Long Term Goal Met Date  03/22/17  [facility] CM Short Term Goal #1   Pt would not have a weight gain in the next 30 days   [facility] CM Short Term Goal #1 Start Date  02/19/17  [facility] CM Short Term Goal #1 Met Date   03/22/17  Interventions for Short Term Goal #2   Reviewed with pt current weights- per pt range 251-253 lbs.     [facility] CM Care Plan Problem Three     Most Recent Value  Care Plan Problem Three  UTI- recurrent   Role Documenting the Problem Three  Care Management Coordinator  Care Plan for Problem Three  Active  [facility] CM Short Term Goal #1   Pt would be free of  recurrent UTI in the next 30 days   [facility] CM Short Term Goal #1 Start Date  02/19/17     Zara Chess.   [place] Care Management  [phone]

## 2017-03-25 DIAGNOSIS — J982 Interstitial emphysema: Secondary | ICD-10-CM | POA: Diagnosis not present

## 2017-03-26 MED ORDER — SULFAMETHOXAZOLE-TRIMETHOPRIM 800-160 MG PO TABS: 1.0000 | ORAL_TABLET | Freq: Two times a day (BID) | ORAL | 0 refills | Status: DC

## 2017-03-26 NOTE — Addendum Note (Signed)
Addended by: [name] on: 03/26/2017 05:11 PM   Modules accepted: Orders

## 2017-03-26 NOTE — Telephone Encounter (Signed)
Did he ever get better on the abx previously then get worse again?  Did it stay the same on the abx?  Let me know so we can make some plans.  Have him keep the OV with urology.  Thanks.

## 2017-03-26 NOTE — Telephone Encounter (Signed)
Restart the septra, defer the ucx, keep the OV with urology.  rx sent.  Thanks.

## 2017-03-26 NOTE — Telephone Encounter (Signed)
Pt left v/m; pt was given Bactrim DS on 03/19/17 and pt still burning upon urination this morning. Pt wants to know if can get refill on abx or does pt need to have U/A done. Pt request cb. [facility] [place].

## 2017-03-26 NOTE — Telephone Encounter (Signed)
Wife says he got a little better for a few days but never totally cleared up.  Over the weekend, he says it felt like razor blades when he urinated.  Wife says they have some sterile bottles and can bring in a sample if you like.  His urology appointment is next week.

## 2017-03-26 NOTE — Telephone Encounter (Signed)
Wife advised. 

## 2017-03-29 ENCOUNTER — Other Ambulatory Visit: Payer: Self-pay

## 2017-03-29 DIAGNOSIS — R310 Gross hematuria: Secondary | ICD-10-CM

## 2017-03-30 ENCOUNTER — Encounter: Payer: Self-pay | Admitting: Urology

## 2017-03-30 ENCOUNTER — Ambulatory Visit (INDEPENDENT_AMBULATORY_CARE_PROVIDER_SITE_OTHER): Payer: Medicare HMO | Admitting: Urology

## 2017-03-30 ENCOUNTER — Other Ambulatory Visit
Admission: RE | Admit: 2017-03-30 | Discharge: 2017-03-30 | Disposition: A | Discharge status: Home / Self Care | Payer: Medicare HMO | Source: Ambulatory Visit | Attending: Urology | Admitting: Urology

## 2017-03-30 VITALS — BP 170/71 | HR 60 | Ht 65.0 in | Wt 260.0 lb

## 2017-03-30 DIAGNOSIS — N471 Phimosis: Secondary | ICD-10-CM | POA: Diagnosis not present

## 2017-03-30 DIAGNOSIS — R310 Gross hematuria: Secondary | ICD-10-CM

## 2017-03-30 DIAGNOSIS — I890 Lymphedema, not elsewhere classified: Secondary | ICD-10-CM | POA: Diagnosis not present

## 2017-03-30 DIAGNOSIS — N281 Cyst of kidney, acquired: Secondary | ICD-10-CM | POA: Diagnosis not present

## 2017-03-30 LAB — URINALYSIS, COMPLETE (UACMP) WITH MICROSCOPIC
Bacteria, UA: NONE SEEN
Bilirubin Urine: NEGATIVE
Glucose, UA: NEGATIVE mg/dL
Hgb urine dipstick: NEGATIVE
Ketones, ur: NEGATIVE mg/dL
Leukocytes, UA: NEGATIVE
Nitrite: NEGATIVE
Protein, ur: 100 mg/dL — AB
RBC / HPF: NONE SEEN RBC/hpf (ref 0–5)
Specific Gravity, Urine: 1.02 (ref 1.005–1.030)
pH: 7.5 (ref 5.0–8.0)

## 2017-03-30 MED ORDER — CLOTRIMAZOLE-BETAMETHASONE 1-0.05 % EX CREA: 1.0000 "application " | TOPICAL_CREAM | Freq: Two times a day (BID) | CUTANEOUS | 0 refills | Status: DC

## 2017-03-30 NOTE — Progress Notes (Signed)
Patient currently on Bactrim no antibiotic given. No Lidocaine used prior because of Phimosis.

## 2017-03-30 NOTE — Progress Notes (Signed)
   03/30/17  CC:  Chief Complaint  Patient presents with  . Cysto    HPI: 71 year old male with an isolated episode of painless gross hematuria who presents today for cystoscopy for further evaluation of this. He is on chronic aspirin and Plavix. Noncontrast CT scan does show a 5 mm right lower pole nonobstructing calculus.  Follow-up renal ultrasound on 12/25/2016 demonstrates that these lesions are in fact positive one simple renal cyst, no further evaluation needed.  Blood pressure (!) 170/71, pulse 60, height 5\' 5"  (1.651 m), weight 260 lb (117.9 kg). NED. A&Ox3.   No respiratory distress   Abd soft, NT, ND, obese  Cystoscopy Procedure Note  Patient identification was confirmed, informed consent was obtained, and patient was prepped using Betadine solution.  Lidocaine jelly was administered per urethral meatus.    Preoperative abx where received prior to procedure.     Pre-Procedure: - Inspection reveals severe phimosis, unable to retract foreskin  Procedure: The flexible cystoscope was introduced without difficulty - No urethral strictures/lesions are present. - Enlarged prostate with trilobar coaptation- bleeding from friable vessels noted - Elevated bladder neck - Bilateral ureteral orifices identified - Bladder mucosa  reveals no ulcers, tumors, or lesions - No bladder stones - Mild trabeculation  Retroflexion shows  significant intravesical protrusion of the median lobe with a ptotic vessels on surveillance   Post-Procedure: - Patient tolerated the procedure well  Assessment/ Plan:  1. Gross hematuria S/p cysto revealing enlarged, friable prostate which is likely etiology of gross hematuria versus nonobstructing renal calculus  2. Phimosis Significant/severe phimosis on exam today, unable to retract foreskin Discussed increased risk for urinary tract infections and voiding symptoms Mycolog cream prescribed today- advised to use bid and reassess in 4  weeks  3. Renal cyst Benign renal cyst on RUS- no further work up indicated  Return in about 6 weeks (around 05/11/2017) for penile recheck.  Hollice Espy, MD

## 2017-04-05 DIAGNOSIS — L570 Actinic keratosis: Secondary | ICD-10-CM | POA: Diagnosis not present

## 2017-04-05 DIAGNOSIS — X32XXXA Exposure to sunlight, initial encounter: Secondary | ICD-10-CM | POA: Diagnosis not present

## 2017-04-07 DIAGNOSIS — J449 Chronic obstructive pulmonary disease, unspecified: Secondary | ICD-10-CM | POA: Diagnosis not present

## 2017-04-24 DIAGNOSIS — J982 Interstitial emphysema: Secondary | ICD-10-CM | POA: Diagnosis not present

## 2017-05-08 DIAGNOSIS — J449 Chronic obstructive pulmonary disease, unspecified: Secondary | ICD-10-CM | POA: Diagnosis not present

## 2017-05-10 ENCOUNTER — Other Ambulatory Visit: Payer: Self-pay | Admitting: *Deleted

## 2017-05-10 MED ORDER — ACCU-CHEK AVIVA PLUS W/DEVICE KIT: PACK | 0 refills | Status: DC

## 2017-05-10 MED ORDER — ACCU-CHEK SOFTCLIX LANCETS MISC: 5 refills | Status: DC

## 2017-05-10 MED ORDER — BD SWAB SINGLE USE REGULAR PADS: MEDICATED_PAD | 5 refills | Status: DC

## 2017-05-10 MED ORDER — GLUCOSE BLOOD VI STRP: ORAL_STRIP | 3 refills | Status: DC

## 2017-05-11 ENCOUNTER — Ambulatory Visit (INDEPENDENT_AMBULATORY_CARE_PROVIDER_SITE_OTHER): Payer: Medicare HMO | Admitting: Urology

## 2017-05-11 ENCOUNTER — Encounter: Payer: Self-pay | Admitting: Urology

## 2017-05-11 VITALS — BP 148/69 | HR 60 | Ht 65.0 in | Wt 240.0 lb

## 2017-05-11 DIAGNOSIS — N401 Enlarged prostate with lower urinary tract symptoms: Secondary | ICD-10-CM

## 2017-05-11 DIAGNOSIS — N200 Calculus of kidney: Secondary | ICD-10-CM

## 2017-05-11 DIAGNOSIS — N390 Urinary tract infection, site not specified: Secondary | ICD-10-CM | POA: Diagnosis not present

## 2017-05-11 DIAGNOSIS — N471 Phimosis: Secondary | ICD-10-CM | POA: Diagnosis not present

## 2017-05-11 DIAGNOSIS — R3915 Urgency of urination: Secondary | ICD-10-CM | POA: Diagnosis not present

## 2017-05-11 NOTE — Progress Notes (Signed)
 05/11/2017 11:29 AM   Stephen [name] 11/17/1945 [id number]  Referring provider: Duncan, Graham S, MD [address] [place], [place] [address]  Chief Complaint  Patient presents with  . Phimosis    6 wk follow up    HPI: 70-year-old with multiple GU who returns today for recheck for phimosis and follow up other GU issues.    History of gross hematuria Isolated episode of painless gross hematuria. No associated flank pain. He does have a history of kidney stones.  He is on chronic Plavix and aspirin.  Noncontrast CT scan shows possible 5 mm right lower pole nonobstructing calculus.   Never smoker.  Cysto 03/2017 with friable prostatic mucosa/  significant intravesical protrusion of the median lobe.  History of stones No recent flank pain.  He is passed several stones, approximately 4-5 stones over his lifetime. None recently. Never required surgical intervention for stones.  CT scan findings as above.    History of BPH/ urinary urcency/ frequency He does have a history of BPH previously on finasteride. He does have urinary frequency and occasional urgency.  Nocturia x 0-1.   He is on chronic lasix.  No recent PSA.     Renal lesion Noncontrast CT scan on 11/23/2016 ordered for further evaluation of gross hematuria. He was noted to have an enlarging left upper pole renal lesion measuring 3.2 from 2.1 as well as lower pole lesion measuring 2.83. 22.1 cm as compared to previous CT scan in 2012. At this time, these were consistent with a simple renal cyst. As a new 1.5 cm low-density lesion which is new on the right kidney.  Follow-up renal ultrasound on 12/25/2016 shows that this is consistent with a simple renal cyst.  He is unable to receive contrast given poor renal function. He is a history of renal artery stenosis status post stent. Most recent creatinine around 2. GFR in the 30-40 range.  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  He was in the late 1990s at [facility] for benign pathology.    Phimosis/ Recurrent UTIs Noted to have severe/significant phimosis the time of cystoscopy.  Started on Mycolog cream.  This is helped with the inflammation but he still not able to fully retract his foreskin.  Overall, he is not particularly bothered by this.  He did have an admission in 12/2016 for sepsis related to E. coli UTI.  He subsequently had several additional urine cultures growing Serratia and mixed organisms.  He has not had a recent urinary tract infection.  Prior to this admission, he reports no issues UTIs.   PMH: Past Medical History:  Diagnosis Date  . Adrenal gland anomaly   . Arthritis   . CAD (coronary artery disease)    a. prior MI and PCI/stenting to mLCx & mRCA in 04/2006; b. NSTEMI mid July 2018 with medical management, LHC 7/18: LM 30%, pLAD 30%, mLAD 50%, dLAD 30%, D1-3 min irregs, mLCx 100% previously treated w/ PCI, OM1 min irregs, pRCA 20%, mRCA lesion-1 10%, mRCA lesion-2 70%, dRCA 50%, RPDA min irregs, post atrio 40%   . CKD (chronic kidney disease), stage III (HCC)   . Colon polyps   . Diabetes mellitus with neuropathy (HCC)   . DM2 (diabetes mellitus, type 2) (HCC)    insulin requiring  . Dyslipidemia   . GERD (gastroesophageal reflux disease)   . HTN (hypertension)   . Kidney stones   . Metabolic syndrome   .   Myocardial infarction (HCC)   . OSA (obstructive sleep apnea)   . 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 Recurrent UTI   . Renal artery stenosis (HCC)    L sided, stent 07/2013  . Rotator cuff disorder    right  . Skin cancer    head  . Systolic dysfunction    a. TTE 12/2016: EF 50-55%, mod concentric LVH, images inadequate for wall motion assessment, not technically sufficienct to allow for LV dias fxn, calcified mitral annulus   . Thyroid disorder     Surgical History: Past Surgical  History:  Procedure Laterality Date  . adrenal adenoma removal  1990-   right  . CARPAL TUNNEL RELEASE     bilateral  . CORONARY ANGIOPLASTY WITH STENT PLACEMENT  2007   x 2  . KNEE SURGERY     right  . LUNG BIOPSY    . RENAL ARTERY STENT  2015   L  . SKIN CANCER EXCISION     back of head    Home Medications:  Allergies as of 05/11/2017      Reactions   Calcium Channel Blockers    Would avoid if possible due to h/o peripheral edema      Medication List        Accurate as of 05/11/17 11:59 PM. Always use your most recent med list.          ACCU-CHEK AVIVA PLUS w/Device Kit Use to check blood sugar three times daily and as needed.  Diagnosis: E11.22  Insulin-dependent   ACCU-CHEK SOFTCLIX LANCETS lancets Use as instructed to check blood sugar three times daily and as needed.  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   amLODipine 5 MG tablet Commonly known as:  NORVASC Take 1 tablet (5 mg total) by mouth daily.   aspirin 81 MG tablet Take 324 mg by mouth daily.   atorvastatin 80 MG tablet Commonly known as:  LIPITOR TAKE 1 TABLET AT BEDTIME   B-D SINGLE USE SWABS REGULAR Pads Use to cleans area prior to checking blood sugar three times daily and as needed.  Diagnosis:  E11.22  Insulin dependent.   carvedilol 25 MG tablet Commonly known as:  COREG TAKE 1 TABLET TWICE DAILY WITH A MEAL   cloNIDine 0.3 MG tablet Commonly known as:  CATAPRES TAKE 1 TABLET THREE TIMES DAILY   clopidogrel 75 MG tablet Commonly known as:  PLAVIX Take 1 tablet (75 mg total) by mouth daily.   clotrimazole-betamethasone cream Commonly known as:  LOTRISONE Apply 1 application topically 2 (two) times daily.   diphenhydrAMINE 50 MG tablet Commonly known as:  BENADRYL Take 50 mg by mouth at bedtime as needed. Pt takes every night   docusate sodium 100 MG capsule Commonly known as:  COLACE Take 100 mg by mouth 2 (two) times daily. As needed.   gabapentin 800 MG tablet Commonly  known as:  NEURONTIN TAKE 1 TABLET IN THE MORNING  AND TAKE 1 AND 1/2 TABLETS AT BEDTIME   glucose blood test strip Commonly known as:  ACCU-CHEK AVIVA PLUS Use as instructed to check blood sugar 3 times daily or as needed.  Diagnosis:  E11.22  Insulin-dependent.   hydrALAZINE 50 MG tablet Commonly known as:  APRESOLINE Take 1 tablet (50 mg total) by mouth 3 (three) times daily.   insulin NPH-regular Human (70-30) 100 UNIT/ML injection Commonly known as:  NOVOLIN 70/30 INJECT  70   UNITS SUBCUTANEOUSLY EVERY MORNING  AND  50 units at bedtime   INSULIN SYRINGE 1CC/30GX5/16" 30G X 5/16" 1 ML Misc Use as directed to take insulin Dx 250.62   isosorbide mononitrate 30 MG 24 hr tablet Commonly known as:  IMDUR Take 3 tablets (90 mg total) by mouth 2 (two) times daily.   nitroGLYCERIN 0.4 MG SL tablet Commonly known as:  NITROSTAT Place 1 tablet (0.4 mg total) under the tongue every 5 (five) minutes as needed. May repeat x3   NON FORMULARY Oxygen 2 liters 24/7   omeprazole 20 MG capsule Commonly known as:  PRILOSEC Take 20 mg by mouth daily.   oxyCODONE 15 MG immediate release tablet Commonly known as:  ROXICODONE Take 1 tablet (15 mg total) by mouth every evening.   torsemide 20 MG tablet Commonly known as:  DEMADEX alternate 16m and 224mdaily.       Allergies:  Allergies  Allergen Reactions  . Calcium Channel Blockers     Would avoid if possible due to h/o peripheral edema    Family History: Family History  Problem Relation Age of Onset  . Heart attack Father   . Heart attack Mother   . Heart attack Brother   . COPD Sister   . Arthritis Maternal Grandmother   . Breast cancer Maternal Aunt   . Diabetes Maternal Aunt   . Hypertension Other   . Hypertension Maternal Aunt   . Prostate cancer Neg Hx   . Kidney cancer Neg Hx   . Bladder Cancer Neg Hx     Social History:  reports that  has never smoked. he has never used smokeless tobacco. He reports that he  does not drink alcohol or use drugs.  ROS: UROLOGY Frequent Urination?: No Hard to postpone urination?: Yes Burning/pain with urination?: No Get up at night to urinate?: No Leakage of urine?: No Urine stream starts and stops?: No Trouble starting stream?: No Do you have to strain to urinate?: No Blood in urine?: No Urinary tract infection?: No Sexually transmitted disease?: No Injury to kidneys or bladder?: No Painful intercourse?: No Weak stream?: No Erection problems?: Yes Penile pain?: No  Gastrointestinal Nausea?: No Vomiting?: No Indigestion/heartburn?: Yes Diarrhea?: No Constipation?: No  Constitutional Fever: No Night sweats?: No Weight loss?: No Fatigue?: No  Skin Skin rash/lesions?: No Itching?: No  Eyes Blurred vision?: No Double vision?: No  Ears/Nose/Throat Sore throat?: No Sinus problems?: No  Hematologic/Lymphatic Swollen glands?: No Easy bruising?: Yes  Cardiovascular Leg swelling?: Yes Chest pain?: Yes  Respiratory Cough?: No Shortness of breath?: Yes  Endocrine Excessive thirst?: No  Musculoskeletal Back pain?: Yes Joint pain?: No  Neurological Headaches?: No Dizziness?: No  Psychologic Depression?: No Anxiety?: No  Physical Exam: BP (!) 148/69   Pulse 60   Ht 5' 5" (1.651 m)   Wt 240 lb (108.9 kg)   BMI 39.94 kg/m   Constitutional:  Alert and oriented, No acute distress. HEENT: [place] AT, moist mucus membranes.  Trachea midline, no masses. Cardiovascular: No clubbing, cyanosis, or edema. Respiratory: Normal respiratory effort, no increased work of breathing. GI: Abdomen is soft, nontender, nondistended, no abdominal masses. Obese.   GU: Bilateral descended testicles, no scrotal skin changes.  Uncircumcised somewhat buried phallus.  No significant inflammation of the foreskin with no tissue integrity satisfactory, but unable to reduce foreskin completely to visualize glans. Skin: No rashes, bruises or suspicious  lesions. Neurologic: Grossly intact, no focal deficits, moving all 4 extremities. Psychiatric: Normal mood and  affect.  Laboratory Data: Lab Results  Component Value Date   WBC 10.9 (H) 01/22/2017   HGB 11.8 (L) 01/22/2017   HCT 36.5 (L) 01/22/2017   MCV 88.5 01/22/2017   PLT 344.0 01/22/2017    Lab Results  Component Value Date   CREATININE 1.77 (H) 03/12/2017    Lab Results  Component Value Date   HGBA1C 8.0 (H) 03/06/2017    Urinalysis Results for orders placed or performed during the hospital encounter of 03/30/17  Urinalysis, Complete w Microscopic  Result Value Ref Range   Color, Urine YELLOW YELLOW   APPearance CLEAR CLEAR   Specific Gravity, Urine 1.020 1.005 - 1.030   pH 7.5 5.0 - 8.0   Glucose, UA NEGATIVE NEGATIVE mg/dL   Hgb urine dipstick NEGATIVE NEGATIVE   Bilirubin Urine NEGATIVE NEGATIVE   Ketones, ur NEGATIVE NEGATIVE mg/dL   Protein, ur 100 (A) NEGATIVE mg/dL   Nitrite NEGATIVE NEGATIVE   Leukocytes, UA NEGATIVE NEGATIVE   Squamous Epithelial / LPF 0-5 (A) NONE SEEN   WBC, UA 0-5 0 - 5 WBC/hpf   RBC / HPF NONE SEEN 0 - 5 RBC/hpf   Bacteria, UA NONE SEEN NONE SEEN    Pertinent Imaging: Renal ultrasound 12/25/2016 demonstrate low-density lesions on previous CT scan consistent with Bosniak I cysts..    Assessment & Plan:    1. Recurrent UTI Recent bout of recurrent urinary tract infections following admission for N STEMI.  Unclear if he had a Foley catheter during that admission inciting that his first urinary tract infection.  Etiology of recurrent infections likely multifactorial.  These include obesity, diabetes, severe phimosis, BPH amongst others.  2. Phimosis Skin integrity proved with Mycolog cream Continue to be unable to fully retract foreskin Discussed the patient would benefit from circumcision, however, not a good candidate for office-based procedure given her obesity and need for antiplatelet therapy. At this point in time, is not  interested in proceeding to the operating room for circumcision. We will continue to follow, if he continues to have recurrent infections, will strongly recommend this  3. 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  Longer taking finasteride but has minimal bother from his symptoms Given his mutiple medical comorbidities, we will defer PSA screening at this time.  4. Kidney stones Nonobstructing right lower pole stone, approximately 5 mm. Continue to recommend conservative management with observation this point in time as he is a poor surgical candidate. Currently asymptomatic.   Return in about 1 year (around 05/11/2018) for UA/ PVR/ IPSS.  Hollice Espy, MD  [facility] [address], [place], [place] [address] [phone]

## 2017-05-16 ENCOUNTER — Telehealth: Payer: Self-pay | Admitting: Family Medicine

## 2017-05-16 ENCOUNTER — Telehealth: Payer: Self-pay | Admitting: Cardiovascular Disease

## 2017-05-16 MED ORDER — ACCU-CHEK AVIVA PLUS W/DEVICE KIT: PACK | 0 refills | Status: DC

## 2017-05-16 MED ORDER — GLUCOSE BLOOD VI STRP: ORAL_STRIP | 1 refills | Status: DC

## 2017-05-16 MED ORDER — BD SWAB SINGLE USE REGULAR PADS: MEDICATED_PAD | 5 refills | Status: DC

## 2017-05-16 MED ORDER — ACCU-CHEK SOFTCLIX LANCETS MISC: 5 refills | Status: AC

## 2017-05-16 NOTE — Telephone Encounter (Signed)
Spoke to patient and advised him that scripts for diabetic supplies were sent to [facility] on 05/10/17. Patient stated that he forgot and will get them at [facility]. Called and advised Myriam Jacobson at [facility] and she stated that she will make a notation on his file that he is getting supplies from his local pharmacy.

## 2017-05-16 NOTE — Telephone Encounter (Signed)
Scripts sent to [facility] per patient's request.

## 2017-05-16 NOTE — Telephone Encounter (Signed)
Pt c/o of Chest Pain: STAT if CP now or developed within 24 hours  1. Are you having CP right now? No   2. Are you experiencing any other symptoms (ex. SOB, nausea, vomiting, sweating)? No   3. How long have you been experiencing CP?  2-3 episode since mi in July this last episode lasted 10 min   4. Is your CP continuous or coming and going?  Comes and goes   5. Have you taken Nitroglycerin? No  ?  Scheduled 11/21 with Sharolyn Douglas added to waitlist

## 2017-05-16 NOTE — Addendum Note (Signed)
Addended by: Emelia Salisbury C on: 05/16/2017 04:35 PM   Modules accepted: Orders

## 2017-05-16 NOTE — Telephone Encounter (Signed)
Spoke with patient and he reports chest pains about 3 or 4 times since he had his heart attack. He reports that he had some last night that lasted about 10 minutes. He did not use nitroglycerin due to headaches. Reviewed signs and symptoms with patient and instructed him to seek evaluation at ED if his symptoms persist or worsen. Confirmed upcoming appointment on 05/23/17 with Ignacia Bayley NP at 11:00 AM. He verbalized understanding of our conversation, agreement with plan, and had no further questions at this time.

## 2017-05-16 NOTE — Telephone Encounter (Signed)
Copied from [place]. Topic: Quick Communication - See Telephone Encounter >> May 16, 2017 12:17 PM Bea Graff, NT wrote: CRM for notification. See Telephone encounter for: [facility] from [facility] is calling to get auth for a new rx request. They are needing diabetic testing supplie: Accu-Check [employer]ia plus meter, test strips, Accu Chek softclicks lancets, and bd single use swab. CB#: [phone]  05/16/17.

## 2017-05-16 NOTE — Telephone Encounter (Signed)
Patient calling back and he does not use [facility] to get his supplies he does use [employer]

## 2017-05-16 NOTE — Telephone Encounter (Signed)
Recent cardiac catheterization 3 months ago chest pain We know left circumflex is occluded There is very distal RCA disease, medical management recommended He is on isosorbide We could try Ranexa 500 mg twice daily for 1 week then up to 1000 mg twice daily This may help his chest pain Could try samples first

## 2017-05-17 NOTE — Telephone Encounter (Signed)
Spoke with patient and reviewed Dr. Donivan Scull recommendations to try new medication. He was agreeable to try this and placed samples up front for him to pick up when possible. Advised to keep appointment as well. He verbalized understanding of our conversation and had no further questions or concerns at this time.   Medication Samples have been provided to the patient.  Drug name: Ranexa       Strength: 500 mg        Qty: 1 pack  LOT: [id number]  Exp.Date: 9/20  Medication Samples have been provided to the patient.  Drug name: Ranexa       Strength: 1000 mg        Qty: 2 packs LOT: [id number]  Exp.Date: 08/20

## 2017-05-23 ENCOUNTER — Ambulatory Visit: Payer: Medicare HMO | Admitting: Nurse Practitioner

## 2017-05-23 ENCOUNTER — Encounter: Payer: Self-pay | Admitting: Nurse Practitioner

## 2017-05-23 VITALS — BP 124/60 | HR 61 | Ht 65.0 in | Wt 261.8 lb

## 2017-05-23 DIAGNOSIS — E119 Type 2 diabetes mellitus without complications: Secondary | ICD-10-CM | POA: Diagnosis not present

## 2017-05-23 DIAGNOSIS — I25118 Atherosclerotic heart disease of native coronary artery with other forms of angina pectoris: Secondary | ICD-10-CM

## 2017-05-23 DIAGNOSIS — N183 Chronic kidney disease, stage 3 unspecified: Secondary | ICD-10-CM

## 2017-05-23 DIAGNOSIS — E785 Hyperlipidemia, unspecified: Secondary | ICD-10-CM

## 2017-05-23 DIAGNOSIS — Z794 Long term (current) use of insulin: Secondary | ICD-10-CM

## 2017-05-23 DIAGNOSIS — I10 Essential (primary) hypertension: Secondary | ICD-10-CM

## 2017-05-23 MED ORDER — ISOSORBIDE MONONITRATE ER 60 MG PO TB24: 60.0000 mg | ORAL_TABLET | Freq: Two times a day (BID) | ORAL | 3 refills | Status: DC

## 2017-05-23 NOTE — Progress Notes (Signed)
Office Visit    Patient Name: Stephen [name] Date of Encounter: 05/23/2017  Primary Care Provider:  [name], MD Primary Cardiologist:  Johnny Bridge, MD   Chief Complaint    71 y/o ? with a history of coronary artery disease status post prior circumflex and RCA stenting, stage III chronic kidney disease, diabetes, hyperlipidemia, left renal artery stenosis status post stenting, pulmonary fibrosis, sleep apnea, and obesity, who presents for follow-up related to recent bouts of chest discomfort.  Past Medical History    Past Medical History:  Diagnosis Date  . Adrenal gland anomaly   . Arthritis   . CAD (coronary artery disease)    a. 04/2006 MI and PCI/stenting to mLCx & mRCA; b. 07/2009 Cath: patent LCX/RCA stents;  c. 12/2016 NSTEMI/Cath: LM 30, LAD 30p, 30m 30d, D1/2/3 min irregs, LCX 1018mSR, OM1 min irregs, RCA 20p, 10 ISR, 7064m0d, RPDA min irregs, RPAV 40-->Med Rx.  . CKD (chronic kidney disease), stage III ([facility] . Colon polyps   . Diabetes mellitus with neuropathy ([facility] . DM2 (diabetes mellitus, type 2) (HCC)    insulin requiring  . Dyslipidemia   . GERD (gastroesophageal reflux disease)   . HTN (hypertension)   . Kidney stones   . Left Renal artery stenosis ([place]  a. 07/2013 s/p PTA/stenting ([place]).  . Myocardial infarction ([place] . OSA (obstructive sleep apnea)   . Pulmonary fibrosis ([place]  a. 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  . Recurrent UTI   . Rotator cuff disorder    right  . Skin cancer    head  . Systolic dysfunction    a. TTE 12/2016: EF 50-55%, mod concentric LVH, images inadequate for wall motion assessment, not technically sufficienct to allow for LV dias fxn, calcified mitral annulus   . Thyroid disorder   . Venous insufficiency of both lower extremities    a. s/p laser treatment.   Past Surgical History:  Procedure Laterality Date  . adrenal adenoma removal  1990-   right    . CARPAL TUNNEL RELEASE     bilateral  . CORONARY ANGIOPLASTY WITH STENT PLACEMENT  2007   x 2  . KNEE SURGERY     right  . LEFT HEART CATH AND CORONARY ANGIOGRAPHY N/A 01/11/2017   Procedure: Left Heart Cath and Coronary Angiography;  Surgeon: AriWellington HampshireD;  [facility];  Service: Cardiovascular;  Laterality: N/A;  . LUNG BIOPSY    . RENAL ARTERY STENT  2015   L  . SKIN CANCER EXCISION     back of head    Allergies  Allergies  Allergen Reactions  . Calcium Channel Blockers     Would avoid if possible due to h/o peripheral edema    History of Present Illness    71 20o ? with a history of coronary artery disease status post prior circumflex and RCA stenting, stage III chronic kidney disease, diabetes, hyperlipidemia, left renal artery stenosis status post stenting, pulmonary fibrosis, sleep apnea, and obesity.  In July of this year, he was admitted with non-STEMI and underwent diagnostic catheterization which showed a total occlusion of the left circumflex in the setting of in-stent restenosis along with 70% mid to distal RCA disease.  He has been medically managed was initially discharged on isosorbide 90 mg twice daily.  He is currently on 60 mg twice daily as  he only had a 1 month prescription for the higher dose.  Over the past 2 weeks, he has had intermittent spells of rest and exertional chest discomfort, usually coming on with activity, associated with mild dyspnea, lasting about 3-5 minutes, and resolving with rest.  He has not needed to take any subungual nitro glycerin.  He did have one episode that occurred while he was in bed at night and lasted about 10 minutes prior to resolving spontaneously.  He called the office recently and was placed on Ranexa 500 mg twice daily.  Since then, he is only had one episodes of chest discomfort which was short-lived.  He is due to increase his Ranexa dose later this week to 1000 mg twice daily.  He denies PND,  orthopnea, dizziness, syncope, or early satiety.  He has chronic mild lower extremity swelling.  Home Medications    Prior to Admission medications   Medication Sig Start Date End Date Taking? Authorizing Provider  ACCU-CHEK SOFTCLIX LANCETS lancets Use as instructed to check blood sugar three times daily and as needed.  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 05/16/17  Yes [name], MD  Alcohol Swabs (B-D SINGLE USE SWABS REGULAR) PADS Use to cleans area prior to checking blood sugar three times daily and as needed.  Diagnosis:  E11.22  Insulin dependent. 05/16/17  Yes [name], MD  amLODipine (NORVASC) 5 MG tablet Take 1 tablet (5 mg total) by mouth daily. 02/28/17  Yes [name], MD  aspirin 81 MG tablet Take 324 mg by mouth daily.    Yes [provider]  atorvastatin (LIPITOR) 80 MG tablet TAKE 1 TABLET AT BEDTIME 02/05/17  Yes [name], MD  Blood Glucose Monitoring Suppl (ACCU-CHEK AVIVA PLUS) w/Device KIT Use to check blood sugar three times daily and as needed.  Diagnosis: E11.22  Insulin-dependent 05/16/17  Yes [name], MD  carvedilol (COREG) 25 MG tablet TAKE 1 TABLET TWICE DAILY WITH A MEAL 02/05/17  Yes [name], MD  cloNIDine (CATAPRES) 0.3 MG tablet TAKE 1 TABLET THREE TIMES DAILY 02/05/17  Yes Gollan, Kathlene November, MD  clopidogrel (PLAVIX) 75 MG tablet Take 1 tablet (75 mg total) by mouth daily. 08/07/16  Yes Minna Merritts, MD  clotrimazole-betamethasone (LOTRISONE) cream Apply 1 application topically 2 (two) times daily. 03/30/17  Yes Hollice Espy, MD  diphenhydrAMINE (BENADRYL) 50 MG tablet Take 50 mg by mouth at bedtime as needed. Pt takes every night   Yes [provider]  docusate sodium (COLACE) 100 MG capsule Take 100 mg by mouth 2 (two) times daily. As needed.   Yes [provider]  gabapentin (NEURONTIN) 800 MG tablet TAKE 1 TABLET IN THE MORNING  AND TAKE 1 AND 1/2 TABLETS AT BEDTIME 10/25/16  Yes [name], MD  glucose blood (ACCU-CHEK AVIVA PLUS) test strip Use as instructed to check blood sugar 3 times daily or as needed.  Diagnosis:  E11.22  Insulin-dependent. 05/16/17  Yes [name], MD  hydrALAZINE (APRESOLINE) 50 MG tablet Take 1 tablet (50 mg total) by mouth 3 (three) times daily. 01/22/17  Yes [name], MD  insulin NPH-regular Human (NOVOLIN 70/30) (70-30) 100 UNIT/ML injection INJECT  70 UNITS SUBCUTANEOUSLY EVERY MORNING  AND  50 units at bedtime 01/22/17  Yes [name], MD  Insulin Syringe-Needle U-100 (INSULIN SYRINGE 1CC/30GX5/16") 30G X 5/16" 1 ML MISC Use as directed to take insulin Dx 250.62 07/14/13  Yes Ria Bush,  MD  isosorbide mononitrate (IMDUR) 30 MG 24 hr tablet Take 3 tablets (90 mg total) by mouth 2 (two) times daily. 01/13/17  Yes Demetrios Loll, MD  nitroGLYCERIN (NITROSTAT) 0.4 MG SL tablet Place 1 tablet (0.4 mg total) under the tongue every 5 (five) minutes as needed. May repeat x3 01/18/17  Yes [name], MD  NON FORMULARY Oxygen 2 liters 24/7   Yes [provider]  omeprazole (PRILOSEC) 20 MG capsule Take 20 mg by mouth daily.     Yes [provider]  oxyCODONE (ROXICODONE) 15 MG immediate release tablet Take 1 tablet (15 mg total) by mouth every evening. 03/12/17  Yes [name], MD  ranolazine (RANEXA) 500 MG 12 hr tablet Take 500 mg by mouth 2 (two) times daily.   Yes [provider]  torsemide (DEMADEX) 20 MG tablet alternate '40mg'$  and '20mg'$  daily. 03/12/17  Yes [name], MD    Review of Systems    Rest and exertional chest discomfort as outlined above.  Chronic mild lower extremity swelling.  He denies palpitations, PND, orthopnea, dizziness, syncope, edema, or early satiety.  All other systems reviewed and are otherwise negative except as noted above.  Physical Exam    VS:  BP 124/60 (BP Location: Left Arm, Patient Position: Sitting, Cuff Size: Large)   Pulse 61   Ht '5\' 5"'$  (1.651 m)    Wt 261 lb 12 oz (118.7 kg)   BMI 43.56 kg/m  , BMI Body mass index is 43.56 kg/m. GEN: Well nourished, well developed, in no acute distress.  HEENT: normal.  Neck: Supple, obese, difficult to gauge JVP, no carotid bruits, or masses. Cardiac: RRR, no murmurs, rubs, or gallops. No clubbing, cyanosis, 1+ bilateral lower extremity edema.  Radials/DP/PT 2+ and equal bilaterally.  Respiratory:  Respirations regular and unlabored, clear to auscultation bilaterally. GI: Soft, nontender, nondistended, BS + x 4. MS: no deformity or atrophy. Skin: warm and dry, no rash. Neuro:  Strength and sensation are intact. Psych: Normal affect.  Accessory Clinical Findings    ECG -regular sinus rhythm, 61, left axis deviation, left anterior fascicular block, prior septal infarct, lateral ST changes.  No acute changes.  Assessment & Plan    1.  Coronary artery disease/unstable angina: Patient has had intermittent predominantly exertional angina occurring a few times over the past 2 weeks.  He has known coronary artery disease with recent non-STEMI in July revealing an occlusion of the left circumflex in the setting of in-stent restenosis and also mid to distal RCA disease.  He has been medically managed and is currently on isosorbide mononitrate 60 mg twice daily in addition to beta-blocker, aspirin, statin, calcium channel blocker, and most recently Ranexa 500 mg twice daily.  He is only had one episode of chest discomfort since being placed on Ranexa and is due to increase his dose to 1000 mg twice daily later this week.  We collectively agreed to continue medical therapy with the plan as previously outlined.  He will give [place] a call in about a week to let us know how Ranexa is working for him and at that point, we can send in a prescription to his mail order pharmacy if appropriate.  If Ranexa does not seem to be making a difference, we can discontinue and titrate his isosorbide to 90 mg twice daily.  He does have a  history of headaches with nitrates and would prefer to avoid titration if possible.  2.  Essential hypertension:  Blood pressure stable on calcium channel blocker, beta-blocker, clonidine, hydralazine, nitrate, and diuretic therapy.  3.  Hyperlipidemia: LDL was 87 in July.  He remains on statin therapy.  We will need to follow-up fasting lipids and LFTs in the future with consideration being given to initiation of Zetia if LDL not at goal.  4.  Type 2 diabetes mellitus: He is on insulin and this is followed closely by primary care.  5.  Stage III chronic kidney disease: Creatinine stable in September.  6.  Disposition: Follow-up with Dr. Rockey Situ in 2-3 months or sooner if necessary.  Murray Hodgkins, NP 05/23/2017, 11:06 AM

## 2017-05-23 NOTE — Patient Instructions (Addendum)
Medication Instructions:  Your physician recommends that you continue on your current medications as directed. Please refer to the Current Medication list given to you today. We have updated your medication list to reflect Imdur dosage of 60mg  twice daily  Labwork: none  Testing/Procedures: none  Follow-Up: Your physician recommends that you schedule a follow-up appointment in: 2-3 months with Dr. Rockey Situ.    Any Other Special Instructions Will Be Listed Below (If Applicable).     If you need a refill on your cardiac medications before your next appointment, please call your pharmacy.

## 2017-05-25 DIAGNOSIS — J982 Interstitial emphysema: Secondary | ICD-10-CM | POA: Diagnosis not present

## 2017-05-30 DIAGNOSIS — N183 Chronic kidney disease, stage 3 (moderate): Secondary | ICD-10-CM | POA: Diagnosis not present

## 2017-05-30 DIAGNOSIS — E1122 Type 2 diabetes mellitus with diabetic chronic kidney disease: Secondary | ICD-10-CM | POA: Diagnosis not present

## 2017-05-30 DIAGNOSIS — R600 Localized edema: Secondary | ICD-10-CM | POA: Diagnosis not present

## 2017-05-30 DIAGNOSIS — I701 Atherosclerosis of renal artery: Secondary | ICD-10-CM | POA: Diagnosis not present

## 2017-05-30 DIAGNOSIS — N2581 Secondary hyperparathyroidism of renal origin: Secondary | ICD-10-CM | POA: Diagnosis not present

## 2017-05-30 DIAGNOSIS — I10 Essential (primary) hypertension: Secondary | ICD-10-CM | POA: Diagnosis not present

## 2017-06-07 DIAGNOSIS — J449 Chronic obstructive pulmonary disease, unspecified: Secondary | ICD-10-CM | POA: Diagnosis not present

## 2017-06-12 ENCOUNTER — Other Ambulatory Visit: Payer: Self-pay | Admitting: Family Medicine

## 2017-06-12 ENCOUNTER — Other Ambulatory Visit: Payer: Medicare HMO

## 2017-06-12 DIAGNOSIS — E1122 Type 2 diabetes mellitus with diabetic chronic kidney disease: Secondary | ICD-10-CM

## 2017-06-12 DIAGNOSIS — Z794 Long term (current) use of insulin: Secondary | ICD-10-CM

## 2017-06-12 DIAGNOSIS — N183 Chronic kidney disease, stage 3 (moderate): Principal | ICD-10-CM

## 2017-06-14 ENCOUNTER — Encounter: Payer: Self-pay | Admitting: Family Medicine

## 2017-06-14 ENCOUNTER — Ambulatory Visit: Payer: Medicare HMO | Admitting: Family Medicine

## 2017-06-14 VITALS — BP 128/62 | HR 55 | Temp 97.7°F | Wt 264.5 lb

## 2017-06-14 DIAGNOSIS — G8929 Other chronic pain: Secondary | ICD-10-CM

## 2017-06-14 DIAGNOSIS — N183 Chronic kidney disease, stage 3 (moderate): Secondary | ICD-10-CM | POA: Diagnosis not present

## 2017-06-14 DIAGNOSIS — R7989 Other specified abnormal findings of blood chemistry: Secondary | ICD-10-CM

## 2017-06-14 DIAGNOSIS — L989 Disorder of the skin and subcutaneous tissue, unspecified: Secondary | ICD-10-CM | POA: Diagnosis not present

## 2017-06-14 DIAGNOSIS — E1122 Type 2 diabetes mellitus with diabetic chronic kidney disease: Secondary | ICD-10-CM | POA: Diagnosis not present

## 2017-06-14 DIAGNOSIS — Z794 Long term (current) use of insulin: Secondary | ICD-10-CM | POA: Diagnosis not present

## 2017-06-14 DIAGNOSIS — M5441 Lumbago with sciatica, right side: Secondary | ICD-10-CM

## 2017-06-14 LAB — HEMOGLOBIN A1C: Hgb A1c MFr Bld: 7.9 % — ABNORMAL HIGH (ref 4.6–6.5)

## 2017-06-14 MED ORDER — OXYCODONE HCL 15 MG PO TABS: 15.0000 mg | ORAL_TABLET | Freq: Every evening | ORAL | 0 refills | Status: DC | PRN

## 2017-06-14 NOTE — Patient Instructions (Addendum)
I'll await the notes from the kidney clinic.  Go to the lab on the way out.  We'll contact you with your lab report. Call about an eye exam when possible.   Take care.  Glad to see you.  Update me as needed.   Plan on a recheck in about 3 months.

## 2017-06-14 NOTE — Progress Notes (Signed)
Diabetes:  Using medications without difficulties: yes Hypoglycemic episodes: rare, d/w pt about cautions Hyperglycemic episodes:no Feet problems: minimal tingling in the feet- improved with gabapentin Blood Sugars averaging: ~120 usually in the AM.   eye exam within last year: due, d/w pt A1c pending.  See notes on labs.    Irritated 65mm R forearm lesion that isn't healing.  Present a few months.  D/w pt about liq N2 tx, he has had lesions frozen prev.   He is still taking oxycodone prn for his back and R leg pain with some relief but out of med currently.  Not used daily.  D/w pt.  No ADE on med.  No illicit use.  We didn't collect UDS today since he is leaving to go to renal clinic for f/u U/a there.   Indication for chronic opioid:  Chronic back/leg pain Medication and dose: oxycodone 15mg  qhs prn pain # pills per month:  30 but that usually lasts >1 month Last UDS date: 2015 Pain contract signed (Y/N):  Yes Date narcotic database last reviewed (include red flags): 06/14/17  He was asking about Cr elevation due to Ranexa.  He is off med currently.  It didn't help with chest pain.  He has f/u with renal pending.  Ranexa ha a <0.5% reported Cr elevation effect, d/w pt.  D/w pt- it may make more sense to stay off med if it wasn't helping.    PMH and SH reviewed  ROS: Per HPI unless specifically indicated in ROS section   Meds, vitals, and allergies reviewed.   GEN: nad, alert and oriented HEENT: mucous membranes moist NECK: supple w/o LA CV: rrr. PULM: ctab, no inc wob ABD: soft, +bs EXT: 1+ BLE edema Irritated 5mm R forearm lesion- appears to be an small irritated SK vs irritated AK.  No ulceration.   Frozen x3 without complications.

## 2017-06-15 ENCOUNTER — Ambulatory Visit (INDEPENDENT_AMBULATORY_CARE_PROVIDER_SITE_OTHER): Payer: Medicare HMO | Admitting: Vascular Surgery

## 2017-06-15 ENCOUNTER — Encounter (INDEPENDENT_AMBULATORY_CARE_PROVIDER_SITE_OTHER): Payer: Medicare HMO

## 2017-06-15 DIAGNOSIS — N183 Chronic kidney disease, stage 3 (moderate): Secondary | ICD-10-CM | POA: Diagnosis not present

## 2017-06-15 DIAGNOSIS — I701 Atherosclerosis of renal artery: Secondary | ICD-10-CM | POA: Diagnosis not present

## 2017-06-15 DIAGNOSIS — E559 Vitamin D deficiency, unspecified: Secondary | ICD-10-CM | POA: Diagnosis not present

## 2017-06-15 DIAGNOSIS — R600 Localized edema: Secondary | ICD-10-CM | POA: Diagnosis not present

## 2017-06-15 DIAGNOSIS — E1122 Type 2 diabetes mellitus with diabetic chronic kidney disease: Secondary | ICD-10-CM | POA: Diagnosis not present

## 2017-06-15 DIAGNOSIS — L989 Disorder of the skin and subcutaneous tissue, unspecified: Secondary | ICD-10-CM | POA: Insufficient documentation

## 2017-06-15 DIAGNOSIS — R809 Proteinuria, unspecified: Secondary | ICD-10-CM | POA: Diagnosis not present

## 2017-06-15 DIAGNOSIS — I10 Essential (primary) hypertension: Secondary | ICD-10-CM | POA: Diagnosis not present

## 2017-06-15 NOTE — Assessment & Plan Note (Signed)
I'll defer to renal.   He was asking about Cr elevation due to Ranexa.  He is off med currently.  It didn't help with chest pain.  He has f/u with renal pending.  Ranexa ha a <0.5% reported Cr elevation/effect.  D/w pt- it may make more sense to stay off med if it wasn't helping.  >25 minutes spent in face to face time with patient, >50% spent in counselling or coordination of care.

## 2017-06-15 NOTE — Assessment & Plan Note (Signed)
Blood Sugars averaging: ~120 usually in the AM.   eye exam within last year: due, d/w pt A1c pending.  See notes on labs.   No change in meds at this point.  He agrees.

## 2017-06-15 NOTE — Assessment & Plan Note (Signed)
Irritated 35mm R forearm lesion- appears to be an small irritated SK vs irritated AK.  No ulceration.   Frozen x3 without complications.  Routine instructions given to patient.  Update me as needed.  He agrees.

## 2017-06-15 NOTE — Assessment & Plan Note (Addendum)
Indication for chronic opioid:  Chronic back/leg pain Medication and dose: oxycodone 15mg  qhs prn pain # pills per month:  30 but that usually lasts >1 month Last UDS date: 2015 Pain contract signed (Y/N):  Yes Date narcotic database last reviewed (include red flags): 06/14/17 rx x2 given to patient.  See orders.  Continue as is.

## 2017-06-24 DIAGNOSIS — J982 Interstitial emphysema: Secondary | ICD-10-CM | POA: Diagnosis not present

## 2017-06-27 DIAGNOSIS — I10 Essential (primary) hypertension: Secondary | ICD-10-CM | POA: Diagnosis not present

## 2017-06-27 DIAGNOSIS — I701 Atherosclerosis of renal artery: Secondary | ICD-10-CM | POA: Diagnosis not present

## 2017-07-08 DIAGNOSIS — J449 Chronic obstructive pulmonary disease, unspecified: Secondary | ICD-10-CM | POA: Diagnosis not present

## 2017-07-12 ENCOUNTER — Encounter (INDEPENDENT_AMBULATORY_CARE_PROVIDER_SITE_OTHER): Payer: Self-pay | Admitting: Vascular Surgery

## 2017-07-12 ENCOUNTER — Ambulatory Visit (INDEPENDENT_AMBULATORY_CARE_PROVIDER_SITE_OTHER): Payer: Medicare HMO | Admitting: Vascular Surgery

## 2017-07-12 ENCOUNTER — Ambulatory Visit (INDEPENDENT_AMBULATORY_CARE_PROVIDER_SITE_OTHER): Payer: Medicare HMO

## 2017-07-12 VITALS — BP 131/63 | HR 52 | Resp 16 | Ht 65.0 in | Wt 267.0 lb

## 2017-07-12 DIAGNOSIS — I701 Atherosclerosis of renal artery: Secondary | ICD-10-CM | POA: Diagnosis not present

## 2017-07-12 DIAGNOSIS — I890 Lymphedema, not elsewhere classified: Secondary | ICD-10-CM | POA: Diagnosis not present

## 2017-07-12 DIAGNOSIS — N183 Chronic kidney disease, stage 3 unspecified: Secondary | ICD-10-CM

## 2017-07-12 DIAGNOSIS — I10 Essential (primary) hypertension: Secondary | ICD-10-CM

## 2017-07-12 NOTE — Progress Notes (Signed)
Subjective:    Patient ID: Stephen [name], male    DOB: February 13, 1946, 72 y.o.   MRN: [id number] Chief Complaint  Patient presents with  . Follow-up    3-4 month Renal f/u   Patient presents to review vascular studies.  The patient was last seen on March 13, 2017 and evaluation of lymphedema and uncontrolled hypertension.  Since his last visit, the patient has received his lymphedema pump and states that he is using it once a day for an hour.  The patient notes that he continues to wear his medical grade one compression stockings and elevate his legs.  He states conservative therapy with the added therapy of his lymphedema pump has improved his bilateral lower extremity edema.  The patient underwent a bilateral renal duplex for uncontrolled hypertension which is notable for no significant stenosis in the right and left renal artery segments.  Normal size right and left kidney LRV flow present bilaterally.  When compared to the previous exam on October 09, 2016 there has been no change.  The patient denies any fever, nausea or vomiting.   Review of Systems  Constitutional: Negative.   HENT: Negative.   Eyes: Negative.   Respiratory: Negative.   Cardiovascular: Positive for leg swelling.       Hypertension  Gastrointestinal: Negative.   Endocrine: Negative.   Genitourinary: Negative.   Musculoskeletal: Negative.   Skin: Negative.   Allergic/Immunologic: Negative.   Neurological: Negative.   Hematological: Negative.   Psychiatric/Behavioral: Negative.       Objective:   Physical Exam  Constitutional: He is oriented to person, place, and time. He appears well-developed and well-nourished. No distress.  HENT:  Head: Normocephalic and atraumatic.  Eyes: Conjunctivae are normal. Pupils are equal, round, and reactive to light.  Neck: Normal range of motion.  Cardiovascular: Normal rate, regular rhythm, normal heart sounds and intact distal pulses.  Pulses:      Radial pulses are 2+ on  the right side, and 2+ on the left side.  Hard to palpate pedal pulses due to body habitus and edema.  His bilateral feet are warm.  Pulmonary/Chest: Effort normal and breath sounds normal.  Musculoskeletal: Normal range of motion. He exhibits edema (Mild to moderate nonpitting edema noted bilaterally).  Neurological: He is alert and oriented to person, place, and time.  Skin: Skin is warm and dry. He is not diaphoretic.  Psychiatric: He has a normal mood and affect. His behavior is normal. Judgment and thought content normal.  Vitals reviewed.  BP 131/63 (BP Location: Right Arm, Patient Position: Sitting)   Pulse (!) 52   Resp 16   Ht 5\' 5"  (1.651 m)   Wt 267 lb (121.1 kg)   BMI 44.43 kg/m   Past Medical History:  Diagnosis Date  . Adrenal gland anomaly   . Arthritis   . CAD (coronary artery disease)    a. 04/2006 MI and PCI/stenting to mLCx & mRCA; b. 07/2009 Cath: patent LCX/RCA stents;  c. 12/2016 NSTEMI/Cath: LM 30, LAD 30p, 34m, 30d, D1/2/3 min irregs, LCX 174m ISR, OM1 min irregs, RCA 20p, 10 ISR, 49m, 50d, RPDA min irregs, RPAV 40-->Med Rx.  . CKD (chronic kidney disease), stage III (HCC)   . Colon polyps   . Diabetes mellitus with neuropathy (HCC)   . DM2 (diabetes mellitus, type 2) (HCC)    insulin requiring  . Dyslipidemia   . GERD (gastroesophageal reflux disease)   . History of echocardiogram    a.  TTE 12/2016: EF 50-55%, mod concentric LVH, images inadequate for wall motion assessment, not technically sufficienct to allow for LV dias fxn, calcified mitral annulus   . HTN (hypertension)   . Kidney stones   . Left Renal artery stenosis (HCC)    a. 07/2013 s/p PTA/stenting ([place]).  . Myocardial infarction (HCC)   . OSA (obstructive sleep apnea)   . Pulmonary fibrosis (HCC)    a. 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  . Recurrent UTI   . Rotator cuff disorder    right  . Skin cancer    head  . Thyroid disorder   .  Venous insufficiency of both lower extremities    a. s/p laser treatment.   Social History   Socioeconomic History  . Marital status: Married    Spouse name: Not on file  . Number of children: 2  . Years of education: Not on file  . Highest education level: Not on file  Social Needs  . Financial resource strain: Not on file  . Food insecurity - worry: Not on file  . Food insecurity - inability: Not on file  . Transportation needs - medical: Not on file  . Transportation needs - non-medical: Not on file  Occupational History  . 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 Smoking status: Never Smoker  . Smokeless tobacco: Never Used  Substance and Sexual Activity  . Alcohol use: No    Alcohol/week: 0.0 oz  . Drug use: No  . Sexual activity: Not on file  Other Topics Concern  . Not on file  Social History Narrative   Enjoys fishing   Public librarian   Past Surgical History:  Procedure Laterality Date  . adrenal adenoma removal  1990-   right  . CARPAL TUNNEL RELEASE     bilateral  . CORONARY ANGIOPLASTY WITH STENT PLACEMENT  2007   x 2  . KNEE SURGERY     right  . LEFT HEART CATH AND CORONARY ANGIOGRAPHY N/A 01/11/2017   Procedure: Left Heart Cath and Coronary Angiography;  Surgeon: Iran Ouch, MD;  Location: [facility] INVASIVE CV LAB;  Service: Cardiovascular;  Laterality: N/A;  . LUNG BIOPSY    . RENAL ARTERY STENT  2015   L  . SKIN CANCER EXCISION     back of head   Family History  Problem Relation Age of Onset  . Heart attack Father   . Heart attack Mother   . Heart attack Brother   . COPD Sister   . Arthritis Maternal Grandmother   . Breast cancer Maternal Aunt   . Diabetes Maternal Aunt   . Hypertension Other   . Hypertension Maternal Aunt   . Prostate cancer Neg Hx   . Kidney cancer Neg Hx   . Bladder Cancer Neg  Hx    Allergies  Allergen Reactions  . Calcium Channel Blockers     Would avoid if possible due to h/o peripheral edema      Assessment & Plan:  Patient presents to review vascular studies.  The patient was last seen on March 13, 2017 and evaluation of lymphedema and uncontrolled hypertension.  Since his last visit, the patient has received his lymphedema pump and states that he is using it once a day for an hour.  The patient notes that he continues  to wear his medical grade one compression stockings and elevate his legs.  He states conservative therapy with the added therapy of his lymphedema pump has improved his bilateral lower extremity edema.  The patient underwent a bilateral renal duplex for uncontrolled hypertension which is notable for no significant stenosis in the right and left renal artery segments.  Normal size right and left kidney LRV flow present bilaterally.  When compared to the previous exam on October 09, 2016 there has been no change.  The patient denies any fever, nausea or vomiting.  1. Essential hypertension, benign - Stable The patient's blood pressure today was 131/61 The patient does not have any renal artery stenosis on duplex I do not feel that the patient's elevated hypertension in the past was from any atherosclerotic disease of the renal arteries. The patient does not have to follow-up in regard to his renal arteries. Patient was given a copy of his renal artery duplex as his nephrologist is not on the epic system  2. Lymphedema - Stable Patient states improvement in his bilateral lower extremity edema with conservative therapy and now the addition of a lymphedema pump. Patient is to follow-up as needed  3. Chronic kidney disease, stage III (moderate) (HCC) - Stable Hypertension control directly affects kidney function. The patient does not have any renal artery stenosis on duplex today This is also a contributing factor to the patient's bilateral lower  extremity edema  Current Outpatient Medications on File Prior to Visit  Medication Sig Dispense Refill  . ACCU-CHEK SOFTCLIX LANCETS lancets Use as instructed to check blood sugar three times daily and as needed.  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 100 each 5  . Alcohol Swabs (B-D SINGLE USE SWABS REGULAR) PADS Use to cleans area prior to checking blood sugar three times daily and as needed.  Diagnosis:  E11.22  Insulin dependent. 100 each 5  . amLODipine (NORVASC) 5 MG tablet Take 1 tablet (5 mg total) by mouth daily. 90 tablet 3  . aspirin 81 MG tablet Take 324 mg by mouth daily.     Marland Kitchen atorvastatin (LIPITOR) 80 MG tablet TAKE 1 TABLET AT BEDTIME 90 tablet 2  . Blood Glucose Monitoring Suppl (ACCU-CHEK AVIVA PLUS) w/Device KIT Use to check blood sugar three times daily and as needed.  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 carvedilol (COREG) 25 MG tablet TAKE 1 TABLET TWICE DAILY WITH A MEAL 180 tablet 2  . cloNIDine (CATAPRES) 0.3 MG tablet TAKE 1 TABLET THREE TIMES DAILY 270 tablet 3  . clopidogrel (PLAVIX) 75 MG tablet Take 1 tablet (75 mg total) by mouth daily. 30 tablet 3  . clotrimazole-betamethasone (LOTRISONE) cream Apply 1 application topically 2 (two) times daily. 30 g 0  . diphenhydrAMINE (BENADRYL) 50 MG tablet Take 50 mg by mouth at bedtime as needed. Pt takes every night    . docusate sodium (COLACE) 100 MG capsule Take 100 mg by mouth 2 (two) times daily. As needed.    . gabapentin (NEURONTIN) 800 MG tablet TAKE 1 TABLET IN THE MORNING  AND TAKE 1 AND 1/2 TABLETS AT BEDTIME 225 tablet 3  . glucose blood (ACCU-CHEK AVIVA PLUS) test strip Use as instructed to check blood sugar 3 times daily or as needed.  Diagnosis:  E11.22  Insulin-dependent. 300 each 1  . hydrALAZINE (APRESOLINE) 50 MG tablet Take 1 tablet (50 mg total) by mouth 3 (three) times daily. 90 tablet 0  . insulin NPH-regular Human (NOVOLIN 70/30) (70-30) 100  UNIT/ML injection INJECT  70 UNITS SUBCUTANEOUSLY  EVERY MORNING  AND  50 units at bedtime 10 vial 1  . Insulin Syringe-Needle U-100 (INSULIN SYRINGE 1CC/30GX5/16") 30G X 5/16" 1 ML MISC Use as directed to take insulin Dx 250.62 180 each 1  . isosorbide mononitrate (IMDUR) 60 MG 24 hr tablet Take 1 tablet (60 mg total) by mouth 2 (two) times daily. 60 tablet 3  . nitroGLYCERIN (NITROSTAT) 0.4 MG SL tablet Place 1 tablet (0.4 mg total) under the tongue every 5 (five) minutes as needed. May repeat x3 25 tablet prn  . NON FORMULARY Oxygen 2 liters 24/7    . omeprazole (PRILOSEC) 20 MG capsule Take 20 mg by mouth daily.      Marland Kitchen oxyCODONE (ROXICODONE) 15 MG immediate release tablet Take 1 tablet (15 mg total) by mouth at bedtime as needed for pain. 30 tablet 0  . oxyCODONE (ROXICODONE) 15 MG immediate release tablet Take 1 tablet (15 mg total) by mouth at bedtime as needed for pain. Fill on/after 07/14/17 30 tablet 0  . torsemide (DEMADEX) 20 MG tablet alternate 40mg  and 20mg  daily.     No current facility-administered medications on file prior to visit.    There are no Patient Instructions on file for this visit. No Follow-up on file.  Maycen Degregory A Timberlyn Pickford, PA-C

## 2017-07-13 ENCOUNTER — Ambulatory Visit (INDEPENDENT_AMBULATORY_CARE_PROVIDER_SITE_OTHER): Payer: Medicare HMO | Admitting: Vascular Surgery

## 2017-07-13 ENCOUNTER — Encounter (INDEPENDENT_AMBULATORY_CARE_PROVIDER_SITE_OTHER): Payer: Medicare HMO

## 2017-07-25 DIAGNOSIS — J982 Interstitial emphysema: Secondary | ICD-10-CM | POA: Diagnosis not present

## 2017-07-26 ENCOUNTER — Other Ambulatory Visit: Payer: Self-pay

## 2017-07-26 MED ORDER — CLOPIDOGREL BISULFATE 75 MG PO TABS: 75.0000 mg | ORAL_TABLET | Freq: Every day | ORAL | 3 refills | Status: DC

## 2017-07-27 ENCOUNTER — Telehealth: Payer: Self-pay | Admitting: Cardiovascular Disease

## 2017-07-27 NOTE — Telephone Encounter (Signed)
°*  STAT* If patient is at the pharmacy, call can be transferred to refill team.   1. Which medications need to be refilled? (please list name of each medication and dose if known) clopidogrel (PLAVIX) 75MG  1 tablet daily   2. Which pharmacy/location (including street and city if local pharmacy) is medication to be sent to? [facility]   3. Do they need a 30 day or 90 day supply? 90 day

## 2017-07-27 NOTE — Telephone Encounter (Signed)
Refill sent 07/26/2017  clopidogrel (PLAVIX) 75 MG tablet 90 tablet 3 07/26/2017    Sig - Route: Take 1 tablet (75 mg total) by mouth daily. - Oral   Sent to pharmacy as: clopidogrel (PLAVIX) 75 MG tablet   E-Prescribing Status: Receipt confirmed by pharmacy (07/26/2017 8:45 AM EST)

## 2017-08-02 ENCOUNTER — Other Ambulatory Visit: Payer: Self-pay | Admitting: *Deleted

## 2017-08-02 MED ORDER — CLOPIDOGREL BISULFATE 75 MG PO TABS: 75.0000 mg | ORAL_TABLET | Freq: Every day | ORAL | 3 refills | Status: DC

## 2017-08-08 DIAGNOSIS — J449 Chronic obstructive pulmonary disease, unspecified: Secondary | ICD-10-CM | POA: Diagnosis not present

## 2017-08-21 NOTE — Progress Notes (Signed)
Cardiology Office Note  Date:  08/23/2017   ID:  Stephen [name], Stephen [name] 12/06/1945, MRN [id number]  PCP:  [name], MD   Chief Complaint  Patient presents with  . OTHER    2-3 month f/u c/o chest pain and sob. Meds reviewed verbally with pt.    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 Status post stent to his left renal artery for 80+ percent stenosis several weeks ago, performed by Dr. Lucky Cowboy. laser treatment of the lower extremity veins for leg swelling/venous insufficiency who presents for routine followup of his coronary artery disease  And Shortness of breath  Cath 01/11/2017, occluded LCX, with collaterals Distal RCA stenosis in-stent 70%, medical management at this time  Echocardiogram in follow-up with ejection fraction 50-55%  Lab work reviewed with him in detail HBA1C 8.0 Cr 2.39, BUN 30 Total chol 141, LDL 87   Continues to have episodes of shortness of breath Will go pull the garbage can without bringing his oxygen, oxygen level likely dropping into the 80s  Also has some chest tightness on heavy exertion Would like to increase the isosorbide up to higher dose No regular exercise program  Denies any significant weight change Chronic leg edema Not using lymphedema compression pumps Weight continues to run high He is not using sublingual nitro, "not that bad"  EKG personally reviewed by myself on todays visit Shows normal sinus rhythm rate 56 bpm left axis deviation unable to exclude old anteroseptal MI  Other past medical history Stress test July 2011 was dobutamine stress that  showed no significant ischemia, inadequate heart rate was achieved. right and left heart catheterization January 2011 showed 50% mid LAD disease, 60% at the ostium of the diagonal #2, 30% proximal left circumflex disease, patent stent in the left circumflex, patent stent of the mid RCA, wedge pressure of 16, PA pressure mean 22, right ventricular pressure 41/8 .  echocardiogram September 2010 shows normal systolic function, diastolic dysfunction, normal RV function unable to evaluate right ventricular systolic pressures  PMH:   has a past medical history of Adrenal gland anomaly, Arthritis, CAD (coronary artery disease), CKD (chronic kidney disease), stage III ([place]), Colon polyps, Diabetes mellitus with neuropathy ([place]), DM2 (diabetes mellitus, type 2) ([place]), Dyslipidemia, GERD (gastroesophageal reflux disease), History of echocardiogram, HTN (hypertension), Kidney stones, Left Renal artery stenosis ([place]), Myocardial infarction ([place]), OSA (obstructive sleep apnea), Pulmonary fibrosis ([place]), Recurrent UTI, Rotator cuff disorder, Skin cancer, Thyroid disorder, and Venous insufficiency of both lower extremities.  PSH:    Past Surgical History:  Procedure Laterality Date  . adrenal adenoma removal  1990-   right  . CARPAL TUNNEL RELEASE     bilateral  . CORONARY ANGIOPLASTY WITH STENT PLACEMENT  2007   x 2  . KNEE SURGERY     right  . LEFT HEART CATH AND CORONARY ANGIOGRAPHY N/A 01/11/2017   Procedure: Left Heart Cath and Coronary Angiography;  Surgeon: Wellington Hampshire, MD;  Location: [facility];  Service: Cardiovascular;  Laterality: N/A;  . LUNG BIOPSY    . RENAL ARTERY STENT  2015   L  . SKIN CANCER EXCISION  back of head    Current Outpatient Medications  Medication Sig Dispense Refill  . ACCU-CHEK SOFTCLIX LANCETS lancets Use as instructed to check blood sugar three times daily and as needed.  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 100 each 5  . Alcohol Swabs (B-D SINGLE  USE SWABS REGULAR) PADS Use to cleans area prior to checking blood sugar three times daily and as needed.  Diagnosis:  E11.22  Insulin dependent. 100 each 5  . amLODipine (NORVASC) 5 MG tablet Take 1 tablet (5 mg total) by mouth daily. 90 tablet 3  . aspirin 81 MG tablet Take 324 mg by mouth daily.     Marland Kitchen atorvastatin (LIPITOR) 80 MG tablet TAKE 1 TABLET AT BEDTIME 90 tablet 2  . Blood Glucose Monitoring Suppl (ACCU-CHEK AVIVA PLUS) w/Device KIT Use to check blood sugar three times daily and as needed.  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 carvedilol (COREG) 25 MG tablet TAKE 1 TABLET TWICE DAILY WITH A MEAL 180 tablet 2  . cloNIDine (CATAPRES) 0.3 MG tablet TAKE 1 TABLET THREE TIMES DAILY 270 tablet 3  . clopidogrel (PLAVIX) 75 MG tablet Take 1 tablet (75 mg total) by mouth daily. 90 tablet 3  . clotrimazole-betamethasone (LOTRISONE) cream Apply 1 application topically 2 (two) times daily. 30 g 0  . diphenhydrAMINE (BENADRYL) 50 MG tablet Take 50 mg by mouth at bedtime as needed. Pt takes every night    . docusate sodium (COLACE) 100 MG capsule Take 100 mg by mouth 2 (two) times daily. As needed.    . gabapentin (NEURONTIN) 800 MG tablet TAKE 1 TABLET IN THE MORNING  AND TAKE 1 AND 1/2 TABLETS AT BEDTIME 225 tablet 3  . glucose blood (ACCU-CHEK AVIVA PLUS) test strip Use as instructed to check blood sugar 3 times daily or as needed.  Diagnosis:  E11.22  Insulin-dependent. 300 each 1  . hydrALAZINE (APRESOLINE) 50 MG tablet Take 1 tablet (50 mg total) by mouth 3 (three) times daily. 90 tablet 0  . insulin NPH-regular Human (NOVOLIN 70/30) (70-30) 100 UNIT/ML injection INJECT  70 UNITS SUBCUTANEOUSLY EVERY MORNING  AND  50 units at bedtime 10 vial 1  . Insulin Syringe-Needle U-100 (INSULIN SYRINGE 1CC/30GX5/16") 30G X 5/16" 1 ML MISC Use as directed to take insulin Dx 250.62 180 each 1  . isosorbide mononitrate (IMDUR) 60 MG 24 hr tablet Take 1 tablet (60 mg total) by mouth 2 (two) times  daily. 60 tablet 3  . nitroGLYCERIN (NITROSTAT) 0.4 MG SL tablet Place 1 tablet (0.4 mg total) under the tongue every 5 (five) minutes as needed. May repeat x3 25 tablet prn  . NON FORMULARY Oxygen 2 liters 24/7    . omeprazole (PRILOSEC) 20 MG capsule Take 20 mg by mouth daily.      Marland Kitchen oxyCODONE (ROXICODONE) 15 MG immediate release tablet Take 1 tablet (15 mg total) by mouth at bedtime as needed for pain. Fill on/after 07/14/17 30 tablet 0  . torsemide (DEMADEX) 20 MG tablet alternate 23m and 256mdaily.     No current facility-administered medications for this visit.      Allergies:   Calcium channel blockers   Social History:  The patient  reports that  has never smoked. he has never used smokeless tobacco. He reports that he does not drink alcohol or use drugs.   Family History:   family history includes Arthritis in his maternal grandmother; Breast cancer in his maternal aunt; COPD in his sister; Diabetes  in his maternal aunt; Heart attack in his brother, father, and mother; Hypertension in his maternal aunt and other.    Review of Systems: Review of Systems  Constitutional: Negative.   Respiratory: Positive for shortness of breath.   Cardiovascular: Positive for chest pain and leg swelling.  Gastrointestinal: Negative.   Musculoskeletal: Negative.   Neurological: Negative.   Psychiatric/Behavioral: Negative.   All other systems reviewed and are negative.    PHYSICAL EXAM: VS:  BP (!) 110/58 (BP Location: Left Arm, Patient Position: Sitting, Cuff Size: Large)   Pulse (!) 56   Ht _0  (1.651 m)   Wt 264 lb 8 oz (120 kg)   BMI 44.02 kg/m  , BMI Body mass index is 44.02 kg/m. Constitutional:  oriented to person, place, and time. No distress.  HENT:  Head: Normocephalic and atraumatic.  Eyes:  no discharge. No scleral icterus.  Neck: Normal range of motion. Neck supple. No JVD present.  Cardiovascular: Normal rate, regular rhythm, normal heart sounds and intact distal  pulses. Exam reveals no gallop and no friction rub.  1+ trace pitting lower extremity edema No murmur heard. Pulmonary/Chest: Moderately decreased breath sounds throughout , no stridor. No respiratory distress.  no wheezes.  no rales.  no tenderness.  Abdominal: Soft.  no distension.  no tenderness.  Musculoskeletal: Normal range of motion.  no  tenderness or deformity.  Neurological:  normal muscle tone. Coordination normal. No atrophy Skin: Skin is warm and dry. No rash noted. not diaphoretic.  Psychiatric:  normal mood and affect. behavior is normal. Thought content normal.       Recent Labs: 01/15/2017: ALT 68 01/16/2017: Magnesium 1.9 01/22/2017: Hemoglobin 11.8; Platelets 344.0 03/12/2017: BUN 22; Creatinine, Ser 1.77; Potassium 3.8; Sodium 139    Lipid Panel Lab Results  Component Value Date   CHOL 141 01/12/2017   HDL 22 (L) 01/12/2017   LDLCALC 87 01/12/2017   TRIG 160 (H) 01/12/2017      Wt Readings from Last 3 Encounters:  08/23/17 264 lb 8 oz (120 kg)  07/12/17 267 lb (121.1 kg)  06/14/17 264 lb 8 oz (120 kg)       ASSESSMENT AND PLAN:  Atherosclerosis of native coronary artery of native heart without angina pectoris -  Chronic stable angina Recommended he work on maintaining his oxygen level, exert himself with supplemental oxygen in place We will increase isosorbide up to 90 twice daily and stop his hydralazine as blood pressure is low He does not want Ranexa We did talk about pharmacological Myoview if symptoms get worse  Essential hypertension, benign - Plan: EKG 12-Lead Plan as above, stop the hydralazine and increase isosorbide  Chronic diastolic CHF (congestive heart failure) ([place]) - Plan: EKG 12-Lead As above will alternate torsemide 40 with 20 He has moderated his fluid intake Stable weight  Mixed hyperlipidemia We will add Zetia to his Lipitor Goal LDL less than 70  Renal artery stenosis, native [facility]) followed by Dr. Lucky Cowboy  Chronic  edema Recommend he use his lymphedema compression pumps, compression hose  Morbid obesity ([place]) We have encouraged continued exercise, careful diet management in an effort to lose weight.  Weight still running high affecting his breathing  PULMONARY FIBROSIS On and off oxygen Followed by pulmonary Recommended he exert himself with oxygen in place to avoid anginal symptoms  Type 2 diabetes mellitus with chronic kidney disease, with long-term current use of insulin, unspecified CKD stage (HCC) Hemoglobin A1c 7.9 Stressed importance of aggressive control  Chronic kidney disease, stage III (moderate) Followed by Dr. Holley Raring   Total encounter time more than 25 minutes  Greater than 50% was spent in counseling and coordination of care with the patient   Disposition:   F/U  12 months   Orders Placed This Encounter  Procedures  . EKG 12-Lead     Signed, Esmond Plants, M.D., Ph.D. 08/23/2017  [place], [place]

## 2017-08-23 ENCOUNTER — Ambulatory Visit: Payer: Medicare HMO | Admitting: Cardiovascular Disease

## 2017-08-23 ENCOUNTER — Encounter: Payer: Self-pay | Admitting: Cardiovascular Disease

## 2017-08-23 VITALS — BP 110/58 | HR 56 | Ht 65.0 in | Wt 264.5 lb

## 2017-08-23 DIAGNOSIS — Z794 Long term (current) use of insulin: Secondary | ICD-10-CM

## 2017-08-23 DIAGNOSIS — J8410 Pulmonary fibrosis, unspecified: Secondary | ICD-10-CM | POA: Diagnosis not present

## 2017-08-23 DIAGNOSIS — I5032 Chronic diastolic (congestive) heart failure: Secondary | ICD-10-CM

## 2017-08-23 DIAGNOSIS — N183 Chronic kidney disease, stage 3 unspecified: Secondary | ICD-10-CM

## 2017-08-23 DIAGNOSIS — I25119 Atherosclerotic heart disease of native coronary artery with unspecified angina pectoris: Secondary | ICD-10-CM

## 2017-08-23 DIAGNOSIS — E785 Hyperlipidemia, unspecified: Secondary | ICD-10-CM | POA: Diagnosis not present

## 2017-08-23 DIAGNOSIS — I25118 Atherosclerotic heart disease of native coronary artery with other forms of angina pectoris: Secondary | ICD-10-CM | POA: Diagnosis not present

## 2017-08-23 DIAGNOSIS — I10 Essential (primary) hypertension: Secondary | ICD-10-CM | POA: Diagnosis not present

## 2017-08-23 DIAGNOSIS — E119 Type 2 diabetes mellitus without complications: Secondary | ICD-10-CM

## 2017-08-23 DIAGNOSIS — E1122 Type 2 diabetes mellitus with diabetic chronic kidney disease: Secondary | ICD-10-CM

## 2017-08-23 MED ORDER — EZETIMIBE 10 MG PO TABS: 10.0000 mg | ORAL_TABLET | Freq: Every day | ORAL | 3 refills | Status: DC

## 2017-08-23 MED ORDER — ISOSORBIDE MONONITRATE ER 60 MG PO TB24: 90.0000 mg | ORAL_TABLET | Freq: Two times a day (BID) | ORAL | 3 refills | Status: DC

## 2017-08-23 NOTE — Patient Instructions (Addendum)
Medication Instructions:   Please increase the isosorbide up to 90 twice a day (1 1/2 twice a day) Stop the hydralazine Please start zetia one a day for cholesterol  Labwork:  No new labs needed  Testing/Procedures:  No further testing at this time   Follow-Up: It was a pleasure seeing you in the office today. Please call us if you have new issues that need to be addressed before your next appt.  [phone]  Your physician wants you to follow-up in: 12 months.  You will receive a reminder letter in the mail two months in advance. If you don't receive a letter, please call our office to schedule the follow-up appointment.  If you need a refill on your cardiac medications before your next appointment, please call your pharmacy.  For educational health videos Log in to : [email or web address] Or : [email or web address], password : [email or web address]

## 2017-08-24 DIAGNOSIS — G4733 Obstructive sleep apnea (adult) (pediatric): Secondary | ICD-10-CM | POA: Diagnosis not present

## 2017-08-25 DIAGNOSIS — J982 Interstitial emphysema: Secondary | ICD-10-CM | POA: Diagnosis not present

## 2017-08-28 NOTE — Progress Notes (Deleted)
Patient ID: Stephen [name], male    DOB: April 15, 1946, 72 y.o.   MRN: [id number]  HPI  Stephen [name] is a 72 y/o male with a history of arthritis, CAD, DM, hyperlipidemia, GERD, HTN, chronic kidney disease, obstructive sleep apnea (CPAP), pulmonary fibrosis (chronic oxygen) and chronic heart failure.   Echo done 01/11/17 shows an EF of 50-55%. Cardiac catheterization done 01/11/17 shows significant 2 vessel CAD with occluded stent in the mid left circumflex with collaterals noted from the LAD and RCA. Patent RCA stent with 70% stenosis. Due to collaterals, optimizing medical therapy was recommended.  Has not been admitted or been in the ED in the last 6 months.   He presents today for his follow-up visit with a chief complaint of    Past Medical History:  Diagnosis Date  . Adrenal gland anomaly   . Arthritis   . CAD (coronary artery disease)    a. 04/2006 MI and PCI/stenting to mLCx & mRCA; b. 07/2009 Cath: patent LCX/RCA stents;  c. 12/2016 NSTEMI/Cath: LM 30, LAD 30p, 42m, 30d, D1/2/3 min irregs, LCX 187m ISR, OM1 min irregs, RCA 20p, 10 ISR, 78m, 50d, RPDA min irregs, RPAV 40-->Med Rx.  . CKD (chronic kidney disease), stage III ([place])   . Colon polyps   . Diabetes mellitus with neuropathy ([place])   . DM2 (diabetes mellitus, type 2) (HCC)    insulin requiring  . Dyslipidemia   . GERD (gastroesophageal reflux disease)   . History of echocardiogram    a. TTE 12/2016: EF 50-55%, mod concentric LVH, images inadequate for wall motion assessment, not technically sufficienct to allow for LV dias fxn, calcified mitral annulus   . HTN (hypertension)   . Kidney stones   . Left Renal artery stenosis ([place])    a. 07/2013 s/p PTA/stenting ([place]).  . Myocardial infarction ([place])   . OSA (obstructive sleep apnea)   . Pulmonary fibrosis ([place])    a. 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  . Recurrent UTI   . Rotator cuff disorder    right  . Skin cancer     head  . Thyroid disorder   . Venous insufficiency of both lower extremities    a. s/p laser treatment.   Past Surgical History:  Procedure Laterality Date  . adrenal adenoma removal  1990-   right  . CARPAL TUNNEL RELEASE     bilateral  . CORONARY ANGIOPLASTY WITH STENT PLACEMENT  2007   x 2  . KNEE SURGERY     right  . LEFT HEART CATH AND CORONARY ANGIOGRAPHY N/A 01/11/2017   Procedure: Left Heart Cath and Coronary Angiography;  Surgeon: Wellington Hampshire, MD;  Location: [facility];  Service: Cardiovascular;  Laterality: N/A;  . LUNG BIOPSY    . RENAL ARTERY STENT  2015   L  . SKIN CANCER EXCISION     back of head   Family History  Problem Relation Age of Onset  . Heart attack Father   . Heart attack Mother   . Heart attack Brother   . COPD Sister   . Arthritis Maternal Grandmother   . Breast cancer Maternal Aunt   . Diabetes Maternal Aunt   . Hypertension Other   . Hypertension Maternal Aunt   . Prostate cancer Neg Hx   . Kidney cancer Neg Hx   . Bladder Cancer Neg Hx    Social History   Tobacco Use  . Smoking status:  Never Smoker  . Smokeless tobacco: Never Used  Substance Use Topics  . Alcohol use: No    Alcohol/week: 0.0 oz   Allergies  Allergen Reactions  . Calcium Channel Blockers     Would avoid if possible due to h/o peripheral edema     Review of Systems  Constitutional: Positive for fatigue. Negative for appetite change.  HENT: Negative for congestion, rhinorrhea and sore throat.   Eyes: Negative.   Respiratory: Positive for shortness of breath (improving). Negative for chest tightness and wheezing.   Cardiovascular: Positive for leg swelling. Negative for chest pain and palpitations.  Gastrointestinal: Negative for abdominal distention and abdominal pain.  Endocrine: Negative.   Genitourinary: Positive for dysuria. Negative for hematuria.  Musculoskeletal: Negative for back pain and neck pain.  Skin: Negative.    Allergic/Immunologic: Negative.   Neurological: Positive for light-headedness. Negative for dizziness.  Hematological: Negative for adenopathy. Does not bruise/bleed easily.  Psychiatric/Behavioral: Negative for dysphoric mood and sleep disturbance (sleeping on 1 pillow; wearing oxygent at 2L around the clock along with CPAP). The patient is not nervous/anxious.      Physical Exam  Constitutional: He is oriented to person, place, and time. He appears well-developed and well-nourished.  HENT:  Head: Normocephalic and atraumatic.  Neck: Normal range of motion. Neck supple. No JVD present.  Cardiovascular: Regular rhythm. Bradycardia present.  Pulmonary/Chest: Effort normal. He has no wheezes. He has no rales.  Abdominal: Soft. He exhibits no distension. There is no tenderness.  Musculoskeletal: He exhibits edema (1+ edema in bilateral lower legs). He exhibits no tenderness.  Neurological: He is alert and oriented to person, place, and time.  Skin: Skin is warm and dry.  Psychiatric: He has a normal mood and affect. His behavior is normal. Thought content normal.  Nursing note and vitals reviewed.  Assessment and Plan:  1: Chronic heart failure with preserved ejection fraction- - NYHA class II - euvolemic today - weighing daily and he was reminded to call for an overnight weight gain of >2 pounds or a weekly weight gain of >5 pounds - not adding salt and wife has been reading food labels. Reviewed the importance of closely following a 2000mg  sodium diet - wearing support socks and elevating legs on occasion which helps a little bit in reducing edema - is trying to increase his activity but is unsure of how much he can do. May benefit from [facility]/cardiac rehab - wears oxygen at 2L around the clock - does not meet ReDS criteria due to BMI - saw cardiology Rockey Situ) 08/23/17  2: HTN- - BP looks good today - BMP from 02/01/17 shows sodium 138, potassium 3.9 and GFR 44 - saw PCP  [name]) 06/14/17  3: Diabetes- - glucose at home this morning was  - A1c on 01/11/17 was 7.0%  4: Obstructive sleep apnea- - wears CPAP on a nightly basis  Patient did not bring his medications nor a list. Each medication was verbally reviewed with the patient and he was encouraged to bring the bottles to every visit to confirm accuracy of list.

## 2017-08-30 ENCOUNTER — Ambulatory Visit: Payer: Medicare HMO | Admitting: Family

## 2017-09-05 DIAGNOSIS — J449 Chronic obstructive pulmonary disease, unspecified: Secondary | ICD-10-CM | POA: Diagnosis not present

## 2017-09-17 ENCOUNTER — Other Ambulatory Visit: Payer: Medicare HMO

## 2017-09-17 ENCOUNTER — Ambulatory Visit (INDEPENDENT_AMBULATORY_CARE_PROVIDER_SITE_OTHER): Payer: Medicare HMO

## 2017-09-17 ENCOUNTER — Other Ambulatory Visit: Payer: Self-pay | Admitting: Family Medicine

## 2017-09-17 VITALS — BP 118/80 | HR 54 | Temp 97.9°F | Ht 65.0 in | Wt 262.5 lb

## 2017-09-17 DIAGNOSIS — E1122 Type 2 diabetes mellitus with diabetic chronic kidney disease: Secondary | ICD-10-CM

## 2017-09-17 DIAGNOSIS — Z794 Long term (current) use of insulin: Secondary | ICD-10-CM

## 2017-09-17 DIAGNOSIS — N183 Chronic kidney disease, stage 3 unspecified: Secondary | ICD-10-CM

## 2017-09-17 DIAGNOSIS — Z0000 Encounter for general adult medical examination without abnormal findings: Secondary | ICD-10-CM

## 2017-09-17 LAB — HEMOGLOBIN A1C: Hgb A1c MFr Bld: 7.6 % — ABNORMAL HIGH (ref 4.6–6.5)

## 2017-09-17 LAB — BASIC METABOLIC PANEL WITH GFR
BUN: 24 mg/dL — ABNORMAL HIGH (ref 6–23)
CO2: 29 meq/L (ref 19–32)
Calcium: 9.4 mg/dL (ref 8.4–10.5)
Chloride: 103 meq/L (ref 96–112)
Creatinine, Ser: 1.73 mg/dL — ABNORMAL HIGH (ref 0.40–1.50)
GFR: 41.76 mL/min — ABNORMAL LOW (ref >60.00)
Glucose, Bld: 165 mg/dL — ABNORMAL HIGH (ref 70–99)
Potassium: 4.3 meq/L (ref 3.5–5.1)
Sodium: 139 meq/L (ref 135–145)

## 2017-09-17 NOTE — Patient Instructions (Signed)
Stephen [name] , Thank you for taking time to come for your Medicare Wellness Visit. I appreciate your ongoing commitment to your health goals. Please review the following plan we discussed and let me know if I can assist you in the future.   These are the goals we discussed: Goals    . Follow up with Primary Care Provider     Starting 09/17/2017, I will continue to take medications as prescribed and to keep appointments with PCP as scheduled.        This is a list of the screening recommended for you and due dates:  Health Maintenance  Topic Date Due  . Complete foot exam   09/25/2017*  . Eye exam for diabetics  09/18/2018*  . Colon Cancer Screening  09/18/2018*  . Tetanus Vaccine  09/18/2018*  . Hemoglobin A1C  12/18/2017  . Flu Shot  Completed  .  Hepatitis C: One time screening is recommended by Center for Disease Control  (CDC) for  adults born from [birth year] through 1963.   Completed  . Pneumonia vaccines  Completed  *Topic was postponed. The date shown is not the original due date.   Preventive Care for Adults  A healthy lifestyle and preventive care can promote health and wellness. Preventive health guidelines for adults include the following key practices.  . A routine yearly physical is a good way to check with your health care provider about your health and preventive screening. It is a chance to share any concerns and updates on your health and to receive a thorough exam.  . Visit your dentist for a routine exam and preventive care every 6 months. Brush your teeth twice a day and floss once a day. Good oral hygiene prevents tooth decay and gum disease.  . The frequency of eye exams is based on your age, health, family medical history, use  of contact lenses, and other factors. Follow your health care provider's recommendations for frequency of eye exams.  . Eat a healthy diet. Foods like vegetables, fruits, whole grains, low-fat dairy products, and lean protein foods contain  the nutrients you need without too many calories. Decrease your intake of foods high in solid fats, added sugars, and salt. Eat the right amount of calories for you. Get information about a proper diet from your health care provider, if necessary.  . Regular physical exercise is one of the most important things you can do for your health. Most adults should get at least 150 minutes of moderate-intensity exercise (any activity that increases your heart rate and causes you to sweat) each week. In addition, most adults need muscle-strengthening exercises on 2 or more days a week.  Silver Sneakers may be a benefit available to you. To determine eligibility, you may visit the website: www.silversneakers.com or contact program at [phone]) [phone] Mon-Fri between 8AM-8PM.   . Maintain a healthy weight. The body mass index (BMI) is a screening tool to identify possible weight problems. It provides an estimate of body fat based on height and weight. Your health care provider can find your BMI and can help you achieve or maintain a healthy weight.   For adults 20 years and older: ? A BMI below 18.5 is considered underweight. ? A BMI of 18.5 to 24.9 is normal. ? A BMI of 25 to 29.9 is considered overweight. ? A BMI of 30 and above is considered obese.   . Maintain normal blood lipids and cholesterol levels by exercising and minimizing your intake of saturated  fat. Eat a balanced diet with plenty of fruit and vegetables. Blood tests for lipids and cholesterol should begin at age 25 and be repeated every 5 years. If your lipid or cholesterol levels are high, you are over 50, or you are at high risk for heart disease, you may need your cholesterol levels checked more frequently. Ongoing high lipid and cholesterol levels should be treated with medicines if diet and exercise are not working.  . If you smoke, find out from your health care provider how to quit. If you do not use tobacco, please do not start.  . If  you choose to drink alcohol, please do not consume more than 2 drinks per day. One drink is considered to be 12 ounces (355 mL) of beer, 5 ounces (148 mL) of wine, or 1.5 ounces (44 mL) of liquor.  . If you are 30-24 years old, ask your health care provider if you should take aspirin to prevent strokes.  . Use sunscreen. Apply sunscreen liberally and repeatedly throughout the day. You should seek shade when your shadow is shorter than you. Protect yourself by wearing long sleeves, pants, a wide-brimmed hat, and sunglasses year round, whenever you are outdoors.  . Once a month, do a whole body skin exam, using a mirror to look at the skin on your back. Tell your health care provider of new moles, moles that have irregular borders, moles that are larger than a pencil eraser, or moles that have changed in shape or color.

## 2017-09-17 NOTE — Progress Notes (Signed)
Subjective:   Stephen [name] is a 72 y.o. male who presents for an Initial Medicare Annual Wellness Visit.  Review of Systems  N/A Cardiac Risk Factors include: advanced age (>54mn, >>41women);male gender;diabetes mellitus;obesity (BMI >30kg/m2);hypertension    Objective:    Today's Vitals   09/17/17 0922  BP: 118/80  Pulse: (!) 54  Temp: 97.9 F (36.6 C)  TempSrc: Oral  SpO2: 98%  Weight: 262 lb 8 oz (119.1 kg)  Height: _0  (1.651 m)  PainSc: 0-No pain   Body mass index is 43.68 kg/m.  Advanced Directives 09/17/2017 03/13/2017 02/27/2017 02/02/2017 01/31/2017 01/16/2017 01/15/2017  Does Patient Have a Medical Advance Directive? _1  No No  Would patient like information on creating a medical advance directive? No - Patient declined - - No - Patient declined - No - Patient declined No - Patient declined    Current Medications (verified) Outpatient Encounter Medications as of 09/17/2017  Medication Sig  . ACCU-CHEK SOFTCLIX LANCETS lancets Use as instructed to check blood sugar three times daily and as needed.  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  . Alcohol Swabs (B-D SINGLE USE SWABS REGULAR) PADS Use to cleans area prior to checking blood sugar three times daily and as needed.  Diagnosis:  E11.22  Insulin dependent.  .Marland KitchenamLODipine (NORVASC) 5 MG tablet Take 1 tablet (5 mg total) by mouth daily.  .[place]land Kitchenaspirin 81 MG tablet Take 324 mg by mouth daily.   .[place]land Kitchenatorvastatin (LIPITOR) 80 MG tablet TAKE 1 TABLET AT BEDTIME  . Blood Glucose Monitoring Suppl (ACCU-CHEK AVIVA PLUS) w/Device KIT Use to check blood sugar three times daily and as needed.  Diagnosis: E11.22  Insulin-dependent  . carvedilol (COREG) 25 MG tablet TAKE 1 TABLET TWICE DAILY WITH A MEAL  . cloNIDine (CATAPRES) 0.3 MG tablet TAKE 1 TABLET THREE TIMES DAILY  . clopidogrel (PLAVIX) 75 MG tablet Take 1 tablet (75 mg total) by mouth daily.  . clotrimazole-betamethasone (LOTRISONE) cream Apply 1 application  topically 2 (two) times daily.  . diphenhydrAMINE (BENADRYL) 50 MG tablet Take 50 mg by mouth at bedtime as needed. Pt takes every night  . docusate sodium (COLACE) 100 MG capsule Take 100 mg by mouth 2 (two) times daily. As needed.  . ezetimibe (ZETIA) 10 MG tablet Take 1 tablet (10 mg total) by mouth daily.  .Marland Kitchengabapentin (NEURONTIN) 800 MG tablet TAKE 1 TABLET IN THE MORNING  AND TAKE 1 AND 1/2 TABLETS AT BEDTIME  . glucose blood (ACCU-CHEK AVIVA PLUS) test strip Use as instructed to check blood sugar 3 times daily or as needed.  Diagnosis:  E11.22  Insulin-dependent.  . insulin NPH-regular Human (NOVOLIN 70/30) (70-30) 100 UNIT/ML injection INJECT  70 UNITS SUBCUTANEOUSLY EVERY MORNING  AND  50 units at bedtime  . Insulin Syringe-Needle U-100 (INSULIN SYRINGE 1CC/30GX5/16") 30G X 5/16" 1 ML MISC Use as directed to take insulin Dx 250.62  . isosorbide mononitrate (IMDUR) 60 MG 24 hr tablet Take 1.5 tablets (90 mg total) by mouth 2 (two) times daily.  . nitroGLYCERIN (NITROSTAT) 0.4 MG SL tablet Place 1 tablet (0.4 mg total) under the tongue every 5 (five) minutes as needed. May repeat x3  . NON FORMULARY Oxygen 2 liters 24/7  . omeprazole (PRILOSEC) 20 MG capsule Take 20 mg by mouth daily.    .Marland KitchenoxyCODONE (ROXICODONE) 15 MG immediate release tablet Take 1 tablet (15 mg total) by mouth at bedtime as needed for pain. Fill on/after 07/14/17  .  torsemide (DEMADEX) 20 MG tablet alternate '40mg'$  and '20mg'$  daily.   No facility-administered encounter medications on file as of 09/17/2017.     Allergies (verified) Calcium channel blockers   History: Past Medical History:  Diagnosis Date  . Adrenal gland anomaly   . Arthritis   . CAD (coronary artery disease)    a. 04/2006 MI and PCI/stenting to mLCx & mRCA; b. 07/2009 Cath: patent LCX/RCA stents;  c. 12/2016 NSTEMI/Cath: LM 30, LAD 30p, 26m 30d, D1/2/3 min irregs, LCX 1061mSR, OM1 min irregs, RCA 20p, 10 ISR, 705m0d, RPDA min irregs, RPAV 40-->Med Rx.   . CKD (chronic kidney disease), stage III ([place] . Colon polyps   . Diabetes mellitus with neuropathy ([place] . DM2 (diabetes mellitus, type 2) (HCC)    insulin requiring  . Dyslipidemia   . GERD (gastroesophageal reflux disease)   . History of echocardiogram    a. TTE 12/2016: EF 50-55%, mod concentric LVH, images inadequate for wall motion assessment, not technically sufficienct to allow for LV dias fxn, calcified mitral annulus   . HTN (hypertension)   . Kidney stones   . Left Renal artery stenosis ([place]  a. 07/2013 s/p PTA/stenting ([place]).  . Myocardial infarction ([place] . OSA (obstructive sleep apnea)   . Pulmonary fibrosis ([place]  a. 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  . Recurrent UTI   . Rotator cuff disorder    right  . Skin cancer    head  . Thyroid disorder   . Venous insufficiency of both lower extremities    a. s/p laser treatment.   Past Surgical History:  Procedure Laterality Date  . adrenal adenoma removal  1990-   right  . CARPAL TUNNEL RELEASE     bilateral  . CORONARY ANGIOPLASTY WITH STENT PLACEMENT  2007   x 2  . KNEE SURGERY     right  . LEFT HEART CATH AND CORONARY ANGIOGRAPHY N/A 01/11/2017   Procedure: Left Heart Cath and Coronary Angiography;  Surgeon: AriWellington HampshireD;  [facility];  Service: Cardiovascular;  Laterality: N/A;  . LUNG BIOPSY    . RENAL ARTERY STENT  2015   L  . SKIN CANCER EXCISION     back of head   Family History  Problem Relation Age of Onset  . Heart attack Father   . Heart attack Mother   . Heart attack Brother   . COPD Sister   . Arthritis Maternal Grandmother   . Breast cancer Maternal Aunt   . Diabetes Maternal Aunt   . Hypertension Other   . Hypertension Maternal Aunt   . Prostate cancer Neg Hx   . Kidney cancer Neg Hx   . Bladder Cancer Neg Hx    Social History   Socioeconomic History  . Marital status: Married    Spouse  name: None  . Number of children: 2  . Years of education: None  . Highest education level: None  Social Needs  . Financial resource strain: None  . Food insecurity - worry: None  . Food insecurity - inability: None  . Transportation needs - medical: None  . Transportation needs - non-medical: None  Occupational History  . Occupation: retired from service station work, then [employer]    Comment: significant metal dust exposure, significant asbestos exposure    Employer: DISABLED  Tobacco Use  . Smoking status: Never  Smoker  . Smokeless tobacco: Never Used  Substance and Sexual Activity  . Alcohol use: No    Alcohol/week: 0.0 oz  . Drug use: No  . Sexual activity: Yes  Other Topics Concern  . None  Social History Narrative   Enjoys fishing   Personnel officer   Tobacco Counseling Counseling given: No   Clinical Intake:  Pre-visit preparation completed: Yes  Pain : No/denies pain Pain Score: 0-No pain     Nutritional Status: BMI > 30  Obese Nutritional Risks: None Diabetes: Yes CBG done?: No Did pt. bring in CBG monitor from home?: No  How often do you need to have someone help you when you read instructions, pamphlets, or other written materials from your doctor or pharmacy?: 1 - Never What is the last grade level you completed in school?: 12th grade  Interpreter Needed?: No  Comments: pt lives with spouse Information entered by :: LPinson, LPN  Activities of Daily Living In your present state of health, do you have any difficulty performing the following activities: 09/17/2017 02/27/2017  Hearing? N N  Vision? Y N  Difficulty concentrating or making decisions? N N  Walking or climbing stairs? Y N  Dressing or bathing? N N  Doing errands, shopping? N N  Preparing Food and eating ? N -  Using the Toilet? N -  In the past six months, have you accidently leaked urine? N -  Do you have problems with loss of bowel control? N -  Managing your Medications? N  -  Managing your Finances? N -  Housekeeping or managing your Housekeeping? N -  Some recent data might be hidden     Immunizations and Health Maintenance Immunization History  Administered Date(s) Administered  . Influenza Split 04/25/2011, 05/17/2012  . Influenza,inj,Quad PF,6+ Mos 04/22/2013, 04/03/2014, 03/02/2015, 03/02/2016, 03/12/2017  . Pneumococcal Conjugate-13 10/26/2014  . Pneumococcal Polysaccharide-23 09/15/2013   There are no preventive care reminders to display for this patient.  Patient Care Team: [name], MD as PCP - General (Family Medicine) Minna Merritts, MD as Consulting Physician (Cardiology) Alisa Graff, FNP as Nurse Practitioner (Family Medicine)    Assessment:   This is a routine wellness examination for Jarryn.  Hearing/Vision screen  Hearing Screening   _0  _1  _2  _3  _4  _5  _6  _7  _8   Right ear:   40 40 40  40    Left ear:   40 40 40  0      Visual Acuity Screening   Right eye Left eye Both eyes  Without correction:     With correction: 20/70 20/70 0    Dietary issues and exercise activities discussed: Current Exercise Habits: The patient does not participate in regular exercise at present(yard work), Exercise limited by: None identified  Goals    . Follow up with Primary Care Provider     Starting 09/17/2017, I will continue to take medications as prescribed and to keep appointments with PCP as scheduled.       Depression Screen PHQ 2/9 Scores 09/17/2017 02/27/2017 02/02/2017 01/31/2017  PHQ - 2 Score 0 0 0 0  PHQ- 9 Score 0 - - -    Fall Risk Fall Risk  09/17/2017 02/27/2017 02/02/2017 01/31/2017 08/31/2016  Falls in the past year? Yes Yes Yes Yes No  Comment multiple falls secondary to sepsis; denies injury - - - -  Number falls in past yr: 2 or more _9 -  Injury with Fall? No No  No No -  Follow up - Falls prevention discussed - Falls prevention discussed -   Cognitive Function: MMSE - Mini  Mental State Exam 09/17/2017  Orientation to time 5  Orientation to Place 5  Registration 3  Attention/ Calculation 0  Recall 3  Language- name 2 objects 0  Language- repeat 1  Language- follow 3 step command 3  Language- read & follow direction 0  Write a sentence 0  Copy design 0  Total score 20     PLEASE NOTE: A Mini-Cog screen was completed. Maximum score is 20. A value of 0 denotes this part of [place] MMSE was not completed or the patient failed this part of the Mini-Cog screening.   Mini-Cog Screening Orientation to Time - Max 5 pts Orientation to Place - Max 5 pts Registration - Max 3 pts Recall - Max 3 pts Language Repeat - Max 1 pts Language Follow 3 Step Command - Max 3 pts   Screening Tests Health Maintenance  Topic Date Due  . FOOT EXAM  09/25/2017 (Originally 08/31/2017)  . OPHTHALMOLOGY EXAM  09/18/2018 (Originally 07/30/2013)  . COLONOSCOPY  09/18/2018 (Originally 09/03/2014)  . TETANUS/TDAP  09/18/2018 (Originally 02/12/1967)  . HEMOGLOBIN A1C  12/18/2017  . INFLUENZA VACCINE  Completed  . Hepatitis C Screening  Completed  . PNA vac Low Risk Adult  Completed      Plan:     I have personally reviewed, addressed, and noted the following in the patient's chart:  A. Medical and social history B. Use of alcohol, tobacco or illicit drugs  C. Current medications and supplements D. Functional ability and status E.  Nutritional status F.  Physical activity G. Advance directives H. List of other physicians I.  Hospitalizations, surgeries, and ER visits in previous 12 months J.  [place] to include hearing, vision, cognitive, depression L. Referrals and appointments - none  In addition, I have reviewed and discussed with patient certain preventive protocols, quality metrics, and best practice recommendations. A written personalized care plan for preventive services as well as general preventive health recommendations were provided to patient.  See  attached scanned questionnaire for additional information.   Signed,   Lindell Noe, MHA, BS, LPN Health Coach

## 2017-09-17 NOTE — Progress Notes (Signed)
PCP notes:   Health maintenance:  Foot exam - PCP please address at next appt Eye exam - addressed Colonoscopy - addressed Tetanus vaccine - PCP please address at next appt A1C - completed  Abnormal screenings:   Hearing - failed  Hearing Screening   125Hz  250Hz  500Hz  1000Hz  2000Hz  3000Hz  4000Hz  6000Hz  8000Hz   Right ear:   40 40 40  40    Left ear:   40 40 40  0     Fall risk - hx of multiple falls Fall Risk  09/17/2017 02/27/2017 02/02/2017 01/31/2017 08/31/2016  Falls in the past year? Yes Yes Yes Yes No  Comment multiple falls secondary to sepsis; denies injury - - - -  Number falls in past yr: 2 or more 1 1 1  -  Injury with Fall? No No No No -  Follow up - Falls prevention discussed - Falls prevention discussed -     Patient concerns:   None  Nurse concerns:  None  Next PCP appt:   09/20/17 @ 1030  I reviewed health advisor's note, was available for consultation on the day of service listed in this note, and agree with documentation and plan. Elsie Stain, MD.

## 2017-09-19 DIAGNOSIS — N183 Chronic kidney disease, stage 3 (moderate): Secondary | ICD-10-CM | POA: Diagnosis not present

## 2017-09-19 DIAGNOSIS — I10 Essential (primary) hypertension: Secondary | ICD-10-CM | POA: Diagnosis not present

## 2017-09-19 DIAGNOSIS — E1122 Type 2 diabetes mellitus with diabetic chronic kidney disease: Secondary | ICD-10-CM | POA: Diagnosis not present

## 2017-09-19 DIAGNOSIS — I701 Atherosclerosis of renal artery: Secondary | ICD-10-CM | POA: Diagnosis not present

## 2017-09-19 DIAGNOSIS — R600 Localized edema: Secondary | ICD-10-CM | POA: Diagnosis not present

## 2017-09-19 DIAGNOSIS — R809 Proteinuria, unspecified: Secondary | ICD-10-CM | POA: Diagnosis not present

## 2017-09-20 ENCOUNTER — Encounter: Payer: Self-pay | Admitting: Family Medicine

## 2017-09-20 ENCOUNTER — Ambulatory Visit (INDEPENDENT_AMBULATORY_CARE_PROVIDER_SITE_OTHER): Payer: Medicare HMO | Admitting: Family Medicine

## 2017-09-20 VITALS — BP 130/66 | HR 59 | Temp 97.8°F | Wt 264.5 lb

## 2017-09-20 DIAGNOSIS — N183 Chronic kidney disease, stage 3 unspecified: Secondary | ICD-10-CM

## 2017-09-20 DIAGNOSIS — Z794 Long term (current) use of insulin: Secondary | ICD-10-CM

## 2017-09-20 DIAGNOSIS — I5032 Chronic diastolic (congestive) heart failure: Secondary | ICD-10-CM | POA: Diagnosis not present

## 2017-09-20 DIAGNOSIS — E1122 Type 2 diabetes mellitus with diabetic chronic kidney disease: Secondary | ICD-10-CM

## 2017-09-20 DIAGNOSIS — R7989 Other specified abnormal findings of blood chemistry: Secondary | ICD-10-CM

## 2017-09-20 DIAGNOSIS — Z0000 Encounter for general adult medical examination without abnormal findings: Secondary | ICD-10-CM

## 2017-09-20 MED ORDER — OXYCODONE HCL 15 MG PO TABS: 15.0000 mg | ORAL_TABLET | Freq: Every evening | ORAL | 0 refills | Status: DC | PRN

## 2017-09-20 NOTE — Patient Instructions (Signed)
Don't change your meds for now.  Check with your insurance to see if they will cover the tetanus shot- it may be cheaper at the pharmacy.  Take care.  Glad to see you.  Recheck in about 3-4 months.   Update me as needed.

## 2017-09-20 NOTE — Progress Notes (Signed)
Diabetes:  Using medications without difficulties: yes Hypoglycemic episodes: rare, only once in the last interval, after prolonged fasting.   Hyperglycemic episodes: no Feet problems: edema improved.  Blood Sugars averaging: 80-120s usually in the AM eye exam within last year: due.  D/w pt.   A1c improved. Not at goal but better.  D/w pt.  He has been trying to be more active.    His CP is better with higher dose of isosorbide.    Tdap may be cheaper for patient at pharmacy.   It may be reasonable to defer colonoscopy given his cardiac/renal/pulmonary situation.  D/w pt.   we can revisit this later.  He has episodically used oxycodone for back pain with some relief.  Not used daily.  He prev had his shoulder injected w/o sig relief.  He didn't want to have surgery done and was putting up with the pain.    Cr stable, d/w pt.  He saw the renal clinic yesterday, I'll await the notes.    Meds, vitals, and allergies reviewed.   ROS: Per HPI unless specifically indicated in ROS section   GEN: nad, alert and oriented, on O2.  HEENT: mucous membranes moist NECK: supple w/o LA CV: rrr. PULM: ctab, no inc wob ABD: soft, +bs EXT: 1+ BLE edema but this is much better than prev.   SKIN: no acute rash  Diabetic foot exam: Normal inspection No skin breakdown No calluses  Normal DP pulses Normal sensation to light touch and monofilament Nails normal

## 2017-09-21 DIAGNOSIS — Z0000 Encounter for general adult medical examination without abnormal findings: Secondary | ICD-10-CM | POA: Insufficient documentation

## 2017-09-21 NOTE — Assessment & Plan Note (Signed)
Creatinine is stable compared to previous.  I will await notes from the renal clinic.  He agrees.

## 2017-09-21 NOTE — Assessment & Plan Note (Signed)
A1c improved. Not at goal but better.  D/w pt.  He has been trying to be more active.  I would continue as is.  He will see if he can continue to make improvement in his A1c with diet and exercise. Labs d/w pt.

## 2017-09-21 NOTE — Assessment & Plan Note (Signed)
It may be reasonable to defer colonoscopy given his cardiac/renal/pulmonary situation.  D/w pt.   we can revisit this later. He agrees to table this for now.

## 2017-09-21 NOTE — Assessment & Plan Note (Signed)
Continue oxygen use.  His swelling is improved from previous.  No change in medications at this point.  He agrees.

## 2017-09-22 DIAGNOSIS — J982 Interstitial emphysema: Secondary | ICD-10-CM | POA: Diagnosis not present

## 2017-10-06 ENCOUNTER — Ambulatory Visit
Admission: EM | Admit: 2017-10-06 | Discharge: 2017-10-06 | Disposition: A | Discharge status: Home / Self Care | Payer: Medicare HMO | Attending: Emergency Medicine | Admitting: Emergency Medicine

## 2017-10-06 ENCOUNTER — Encounter: Payer: Self-pay | Admitting: Gynecology

## 2017-10-06 DIAGNOSIS — J449 Chronic obstructive pulmonary disease, unspecified: Secondary | ICD-10-CM | POA: Diagnosis not present

## 2017-10-06 DIAGNOSIS — R3129 Other microscopic hematuria: Secondary | ICD-10-CM | POA: Diagnosis not present

## 2017-10-06 DIAGNOSIS — R300 Dysuria: Secondary | ICD-10-CM

## 2017-10-06 LAB — URINALYSIS, COMPLETE (UACMP) WITH MICROSCOPIC
Bacteria, UA: NONE SEEN
Bilirubin Urine: NEGATIVE
Glucose, UA: NEGATIVE mg/dL
Ketones, ur: NEGATIVE mg/dL
Leukocytes, UA: NEGATIVE
Nitrite: NEGATIVE
Protein, ur: 100 mg/dL — AB
Specific Gravity, Urine: 1.015 (ref 1.005–1.030)
Squamous Epithelial / LPF: NONE SEEN
WBC, UA: NONE SEEN WBC/hpf (ref 0–5)
pH: 6 (ref 5.0–8.0)

## 2017-10-06 MED ORDER — TAMSULOSIN HCL 0.4 MG PO CAPS: 0.4000 mg | ORAL_CAPSULE | Freq: Every day | ORAL | 0 refills | Status: DC

## 2017-10-06 MED ORDER — PHENAZOPYRIDINE HCL 100 MG PO TABS: 100.0000 mg | ORAL_TABLET | Freq: Two times a day (BID) | ORAL | 0 refills | Status: AC

## 2017-10-06 NOTE — Discharge Instructions (Addendum)
Continue pushing plenty of extra fluids.  Take the Pyridium only if severe pain with urination.  Take the Flomax.  We will call you with your urine culture results if you end up having a urinary tract infection that requires an antibiotic.

## 2017-10-06 NOTE — ED Triage Notes (Signed)
Patient c/o burning with urination and back pain x this am.

## 2017-10-06 NOTE — ED Provider Notes (Signed)
HPI  SUBJECTIVE:  Stephen [name] is a 72 y.o. male who presents with dysuria starting this morning.  Reports burning with urination.  Reports burning bilateral low back pain.  Also reports some frequency.  No nausea, vomiting, fevers.  No urinary urgency, cloudy odorous urine, hematuria.  No abdominal, pelvic, perineal pain.  No penile discharge, itching, genital rash.  He states that he is able to fully retract his foreskin.  He is in a long-term monogamous relationship with his wife who is asymptomatic.  STDs are not a concern today.  No testicular pain, swelling, erythema.  No antibiotics in the past month.  States that his glucose has been running within normal limits for him.  He states that he has had similar symptoms before with previous UTIs.  He is able to completely empty his bladder.  He tried pushing fluids without improvement in symptoms.  No aggravating factors.  Past medical history of recurrent UTI, diabetes, hypertension, nephrolithiasis, left renal artery stenosis, MI, CHF, chronic kidney disease.  Admitted in 12/2016 for E. coli urosepsis.  No history of prostatitis, urethritis, orchitis, epididymitis, STI's, yeast infections.   Seen by Dr. Erlene Quan, urology in September 2018 for gross hematuria.  Noncontrast CT scan showed a 5 mm right lower pole nonobstructing calculus.  Had a cystoscopy showed enlarged, friable prostate which was thought to be the cause of his hematuria.  PMD: [name], MD Neurology: Dr. Erlene Quan. Past Medical History:  Diagnosis Date  . Adrenal gland anomaly   . Arthritis   . CAD (coronary artery disease)    a. 04/2006 MI and PCI/stenting to mLCx & mRCA; b. 07/2009 Cath: patent LCX/RCA stents;  c. 12/2016 NSTEMI/Cath: LM 30, LAD 30p, 41m 30d, D1/2/3 min irregs, LCX 1058mSR, OM1 min irregs, RCA 20p, 10 ISR, 7025m0d, RPDA min irregs, RPAV 40-->Med Rx.  . CKD (chronic kidney disease), stage III ([facility] . Colon polyps   . Diabetes mellitus with  neuropathy ([facility] [place] . DM2 (diabetes mellitus, type 2) (HCC)    insulin requiring  . Dyslipidemia   . GERD (gastroesophageal reflux disease)   . History of echocardiogram    a. TTE 12/2016: EF 50-55%, mod concentric LVH, images inadequate for wall motion assessment, not technically sufficienct to allow for LV dias fxn, calcified mitral annulus   . HTN (hypertension)   . Kidney stones   . Left Renal artery stenosis ([facility]  a. 07/2013 s/p PTA/stenting ([place]).  . Myocardial infarction ([facility] . OSA (obstructive sleep apnea)   . 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  . Recurrent UTI   . Rotator cuff disorder    right  . Skin cancer    head  . Thyroid disorder   . Venous insufficiency of both lower extremities    a. s/p laser treatment.    Past Surgical History:  Procedure Laterality Date  . adrenal adenoma removal  1990-   right  . CARPAL TUNNEL RELEASE     bilateral  . CORONARY ANGIOPLASTY WITH STENT PLACEMENT  2007   x 2  . KNEE SURGERY     right  . LEFT HEART CATH AND CORONARY ANGIOGRAPHY N/A 01/11/2017   Procedure: Left Heart Cath and Coronary Angiography;  Surgeon: AriWellington HampshireD;  [facility];  Service: Cardiovascular;  Laterality: N/A;  . LUNG BIOPSY    . RENAL ARTERY  STENT  2015   L  . SKIN CANCER EXCISION     back of head    Family History  Problem Relation Age of Onset  . Heart attack Father   . Heart attack Mother   . Heart attack Brother   . COPD Sister   . Arthritis Maternal Grandmother   . Breast cancer Maternal Aunt   . Diabetes Maternal Aunt   . Hypertension Other   . Hypertension Maternal Aunt   . Prostate cancer Neg Hx   . Kidney cancer Neg Hx   . Bladder Cancer Neg Hx   . Colon cancer Neg Hx     Social History   Tobacco Use  . Smoking status: Never Smoker  . Smokeless tobacco: Never Used  Substance Use Topics  . Alcohol use: No     Alcohol/week: 0.0 oz  . Drug use: No    No current facility-administered medications for this encounter.   Current Outpatient Medications:  .  ACCU-CHEK SOFTCLIX LANCETS lancets, Use as instructed to check blood sugar three times daily and as needed.  Diagnosis: E11.22  Insulin-dependent., Disp: 100 each, Rfl: 5 .  Alcohol Swabs (B-D SINGLE USE SWABS REGULAR) PADS, Use to cleans area prior to checking blood sugar three times daily and as needed.  Diagnosis:  E11.22  Insulin dependent., Disp: 100 each, Rfl: 5 .  amLODipine (NORVASC) 5 MG tablet, Take 1 tablet (5 mg total) by mouth daily., Disp: 90 tablet, Rfl: 3 .  aspirin 81 MG tablet, Take 324 mg by mouth daily. , Disp: , Rfl:  .  atorvastatin (LIPITOR) 80 MG tablet, TAKE 1 TABLET AT BEDTIME, Disp: 90 tablet, Rfl: 2 .  Blood Glucose Monitoring Suppl (ACCU-CHEK AVIVA PLUS) w/Device KIT, Use to check blood sugar three times daily and as needed.  Diagnosis: E11.22  Insulin-dependent, Disp: 1 kit, Rfl: 0 .  carvedilol (COREG) 25 MG tablet, TAKE 1 TABLET TWICE DAILY WITH A MEAL, Disp: 180 tablet, Rfl: 2 .  cloNIDine (CATAPRES) 0.3 MG tablet, TAKE 1 TABLET THREE TIMES DAILY, Disp: 270 tablet, Rfl: 3 .  clopidogrel (PLAVIX) 75 MG tablet, Take 1 tablet (75 mg total) by mouth daily., Disp: 90 tablet, Rfl: 3 .  clotrimazole-betamethasone (LOTRISONE) cream, Apply 1 application topically 2 (two) times daily., Disp: 30 g, Rfl: 0 .  diphenhydrAMINE (BENADRYL) 50 MG tablet, Take 50 mg by mouth at bedtime as needed. Pt takes every night, Disp: , Rfl:  .  docusate sodium (COLACE) 100 MG capsule, Take 100 mg by mouth 2 (two) times daily. As needed., Disp: , Rfl:  .  ezetimibe (ZETIA) 10 MG tablet, Take 1 tablet (10 mg total) by mouth daily., Disp: 90 tablet, Rfl: 3 .  gabapentin (NEURONTIN) 800 MG tablet, TAKE 1 TABLET IN THE MORNING  AND TAKE 1 AND 1/2 TABLETS AT BEDTIME, Disp: 225 tablet, Rfl: 3 .  glucose blood (ACCU-CHEK AVIVA PLUS) test strip, Use as  instructed to check blood sugar 3 times daily or as needed.  Diagnosis:  E11.22  Insulin-dependent., Disp: 300 each, Rfl: 1 .  insulin NPH-regular Human (NOVOLIN 70/30) (70-30) 100 UNIT/ML injection, INJECT  70 UNITS SUBCUTANEOUSLY EVERY MORNING  AND  50 units at bedtime, Disp: 10 vial, Rfl: 1 .  Insulin Syringe-Needle U-100 (INSULIN SYRINGE 1CC/30GX5/16") 30G X 5/16" 1 ML MISC, Use as directed to take insulin Dx 250.62, Disp: 180 each, Rfl: 1 .  isosorbide mononitrate (IMDUR) 60 MG 24 hr tablet, Take 1.5 tablets (90 mg total)  by mouth 2 (two) times daily., Disp: 270 tablet, Rfl: 3 .  nitroGLYCERIN (NITROSTAT) 0.4 MG SL tablet, Place 1 tablet (0.4 mg total) under the tongue every 5 (five) minutes as needed. May repeat x3, Disp: 25 tablet, Rfl: prn .  NON FORMULARY, Oxygen 2 liters 24/7, Disp: , Rfl:  .  omeprazole (PRILOSEC) 20 MG capsule, Take 20 mg by mouth daily.  , Disp: , Rfl:  .  oxyCODONE (ROXICODONE) 15 MG immediate release tablet, Take 1 tablet (15 mg total) by mouth at bedtime as needed for pain., Disp: 30 tablet, Rfl: 0 .  torsemide (DEMADEX) 20 MG tablet, alternate 57m and 242mdaily., Disp: , Rfl:  .  phenazopyridine (PYRIDIUM) 100 MG tablet, Take 1 tablet (100 mg total) by mouth 2 (two) times daily for 2 days., Disp: 4 tablet, Rfl: 0 .  tamsulosin (FLOMAX) 0.4 MG CAPS capsule, Take 1 capsule (0.4 mg total) by mouth at bedtime., Disp: 14 capsule, Rfl: 0  Allergies  Allergen Reactions  . Calcium Channel Blockers     Would avoid if possible due to h/o peripheral edema     ROS  As noted in HPI.   Physical Exam  BP (!) 157/65 (BP Location: Right Arm)   Pulse 63   Temp 98.4 F (36.9 C) (Oral)   Resp 18   Wt 264 lb (119.7 kg)   SpO2 97%   BMI 43.93 kg/m   Constitutional: Well developed, well nourished, no acute distress Eyes:  EOMI, conjunctiva normal bilaterally HENT: Normocephalic, atraumatic,mucus membranes moist Respiratory: Normal inspiratory effort Cardiovascular:  Normal rate GI: nondistended no suprapubic or flank tenderness Back: Mild right CVAT GU: Normal uncircumcised male.  Patient able to fully retract foreskin.  Normal glans penis.  No erythema, discharge.  No epididymal, testicular tenderness.  No scrotal swelling, erythema.  Patient declined chaperone. Rectal: Nontender, firm, enlarged prostate.  Declined chaperone. skin: No rash, skin intact Musculoskeletal: no deformities Neurologic: Alert & oriented x 3, no focal neuro deficits Psychiatric: Speech and behavior appropriate   ED Course   Medications - No data to display  Orders Placed This Encounter  Procedures  . Urine culture    Standing Status:   Standing    Number of Occurrences:   1    Order Specific Question:   Patient immune status    Answer:   Normal  . Urinalysis, Complete w Microscopic    Standing Status:   Standing    Number of Occurrences:   1    Results for orders placed or performed during the hospital encounter of 10/06/17 (from the past 24 hour(s))  Urinalysis, Complete w Microscopic     Status: Abnormal   Collection Time: 10/06/17 11:44 AM  Result Value Ref Range   Color, Urine STRAW (A) YELLOW   APPearance CLEAR CLEAR   Specific Gravity, Urine 1.015 1.005 - 1.030   pH 6.0 5.0 - 8.0   Glucose, UA NEGATIVE NEGATIVE mg/dL   Hgb urine dipstick TRACE (A) NEGATIVE   Bilirubin Urine NEGATIVE NEGATIVE   Ketones, ur NEGATIVE NEGATIVE mg/dL   Protein, ur 100 (A) NEGATIVE mg/dL   Nitrite NEGATIVE NEGATIVE   Leukocytes, UA NEGATIVE NEGATIVE   Squamous Epithelial / LPF NONE SEEN NONE SEEN   WBC, UA NONE SEEN 0 - 5 WBC/hpf   RBC / HPF 0-5 0 - 5 RBC/hpf   Bacteria, UA NONE SEEN NONE SEEN   No results found.  ED Clinical Impression  Dysuria  Other microscopic hematuria  ED Assessment/Plan  he has no evidence of UTI with negative nitrite, esterase, bacteria.  Given his history of E. coli urosepsis and recurrent UTIs, will send this off for culture to make  sure that there is no UTI and if there is, to target antibiotic therapy appropriately.  He does have some trace hematuria which could be the cause of his symptoms.  Could be from another kidney stone.  CT is not available at this facility today.  Low suspicion for STI.  Does not appear to be a yeast infection.  No evidence of prostatitis, orchitis, epididymitis.  Plan to send home on Flomax, Pyridium, he will follow-up with his primary care physician in several days.  We will call him with urine culture results and start him on antibiotics if necessary.  Gave him strict ER return precautions.   calculated creatinine clearance based on labs from 09/17/2017, 68.26.  Will send home with 100 milligram Pyridium twice daily.  Discussed labs,  MDM, treatment plan, and plan for follow-up with patient. Discussed sn/sx that should prompt return to the ED. patient agrees with plan.   Meds ordered this encounter  Medications  . tamsulosin (FLOMAX) 0.4 MG CAPS capsule    Sig: Take 1 capsule (0.4 mg total) by mouth at bedtime.    Dispense:  14 capsule    Refill:  0  . phenazopyridine (PYRIDIUM) 100 MG tablet    Sig: Take 1 tablet (100 mg total) by mouth 2 (two) times daily for 2 days.    Dispense:  4 tablet    Refill:  0    *This clinic note was created using [employer]. Therefore, there may be occasional mistakes despite careful proofreading.   ?   Melynda Ripple, MD 10/06/17 [phone]

## 2017-10-07 LAB — URINE CULTURE
Culture: NO GROWTH
Special Requests: NORMAL

## 2017-10-09 ENCOUNTER — Ambulatory Visit (INDEPENDENT_AMBULATORY_CARE_PROVIDER_SITE_OTHER): Payer: Medicare HMO | Admitting: Vascular Surgery

## 2017-10-09 ENCOUNTER — Encounter (INDEPENDENT_AMBULATORY_CARE_PROVIDER_SITE_OTHER): Payer: Medicare HMO

## 2017-10-15 ENCOUNTER — Other Ambulatory Visit: Payer: Self-pay | Admitting: Family Medicine

## 2017-10-23 DIAGNOSIS — J982 Interstitial emphysema: Secondary | ICD-10-CM | POA: Diagnosis not present

## 2017-11-05 DIAGNOSIS — J449 Chronic obstructive pulmonary disease, unspecified: Secondary | ICD-10-CM | POA: Diagnosis not present

## 2017-11-22 DIAGNOSIS — J982 Interstitial emphysema: Secondary | ICD-10-CM | POA: Diagnosis not present

## 2017-12-06 DIAGNOSIS — J449 Chronic obstructive pulmonary disease, unspecified: Secondary | ICD-10-CM | POA: Diagnosis not present

## 2017-12-16 ENCOUNTER — Other Ambulatory Visit: Payer: Self-pay | Admitting: Family Medicine

## 2017-12-16 DIAGNOSIS — E1122 Type 2 diabetes mellitus with diabetic chronic kidney disease: Secondary | ICD-10-CM

## 2017-12-16 DIAGNOSIS — Z794 Long term (current) use of insulin: Secondary | ICD-10-CM

## 2017-12-16 DIAGNOSIS — N183 Chronic kidney disease, stage 3 (moderate): Principal | ICD-10-CM

## 2017-12-17 ENCOUNTER — Other Ambulatory Visit: Payer: Medicare HMO

## 2017-12-18 ENCOUNTER — Other Ambulatory Visit (INDEPENDENT_AMBULATORY_CARE_PROVIDER_SITE_OTHER): Payer: Medicare HMO

## 2017-12-18 DIAGNOSIS — N183 Chronic kidney disease, stage 3 (moderate): Secondary | ICD-10-CM

## 2017-12-18 DIAGNOSIS — Z794 Long term (current) use of insulin: Secondary | ICD-10-CM

## 2017-12-18 DIAGNOSIS — E1122 Type 2 diabetes mellitus with diabetic chronic kidney disease: Secondary | ICD-10-CM | POA: Diagnosis not present

## 2017-12-18 LAB — LIPID PANEL
Cholesterol: 91 mg/dL (ref 0–200)
HDL: 23.8 mg/dL — ABNORMAL LOW (ref >39.00)
LDL Cholesterol: 45 mg/dL (ref 0–99)
NonHDL: 66.8
Total CHOL/HDL Ratio: 4
Triglycerides: 111 mg/dL (ref 0.0–149.0)
VLDL: 22.2 mg/dL (ref 0.0–40.0)

## 2017-12-18 LAB — COMPREHENSIVE METABOLIC PANEL
ALT: 18 U/L (ref 0–53)
AST: 15 U/L (ref 0–37)
Albumin: 3.2 g/dL — ABNORMAL LOW (ref 3.5–5.2)
Alkaline Phosphatase: 78 U/L (ref 39–117)
BUN: 21 mg/dL (ref 6–23)
CO2: 28 mEq/L (ref 19–32)
Calcium: 8.4 mg/dL (ref 8.4–10.5)
Chloride: 107 mEq/L (ref 96–112)
Creatinine, Ser: 1.82 mg/dL — ABNORMAL HIGH (ref 0.40–1.50)
GFR: 39.36 mL/min — ABNORMAL LOW (ref >60.00)
Glucose, Bld: 149 mg/dL — ABNORMAL HIGH (ref 70–99)
Potassium: 3.7 mEq/L (ref 3.5–5.1)
Sodium: 142 mEq/L (ref 135–145)
Total Bilirubin: 0.5 mg/dL (ref 0.2–1.2)
Total Protein: 6.3 g/dL (ref 6.0–8.3)

## 2017-12-18 LAB — HEMOGLOBIN A1C: Hgb A1c MFr Bld: 8.3 % — ABNORMAL HIGH (ref 4.6–6.5)

## 2017-12-21 ENCOUNTER — Ambulatory Visit (INDEPENDENT_AMBULATORY_CARE_PROVIDER_SITE_OTHER): Payer: Medicare HMO | Admitting: Family Medicine

## 2017-12-21 ENCOUNTER — Encounter: Payer: Self-pay | Admitting: Family Medicine

## 2017-12-21 DIAGNOSIS — G8929 Other chronic pain: Secondary | ICD-10-CM

## 2017-12-21 DIAGNOSIS — E1122 Type 2 diabetes mellitus with diabetic chronic kidney disease: Secondary | ICD-10-CM | POA: Diagnosis not present

## 2017-12-21 DIAGNOSIS — I25119 Atherosclerotic heart disease of native coronary artery with unspecified angina pectoris: Secondary | ICD-10-CM

## 2017-12-21 DIAGNOSIS — N183 Chronic kidney disease, stage 3 unspecified: Secondary | ICD-10-CM

## 2017-12-21 DIAGNOSIS — Z794 Long term (current) use of insulin: Secondary | ICD-10-CM

## 2017-12-21 DIAGNOSIS — M5442 Lumbago with sciatica, left side: Secondary | ICD-10-CM | POA: Diagnosis not present

## 2017-12-21 DIAGNOSIS — I5032 Chronic diastolic (congestive) heart failure: Secondary | ICD-10-CM | POA: Diagnosis not present

## 2017-12-21 DIAGNOSIS — E785 Hyperlipidemia, unspecified: Secondary | ICD-10-CM | POA: Diagnosis not present

## 2017-12-21 MED ORDER — TAMSULOSIN HCL 0.4 MG PO CAPS: 0.4000 mg | ORAL_CAPSULE | Freq: Every day | ORAL | 3 refills | Status: DC

## 2017-12-21 MED ORDER — TAMSULOSIN HCL 0.4 MG PO CAPS: 0.4000 mg | ORAL_CAPSULE | Freq: Every day | ORAL | 0 refills | Status: DC

## 2017-12-21 MED ORDER — OXYCODONE HCL 15 MG PO TABS: 15.0000 mg | ORAL_TABLET | Freq: Every evening | ORAL | 0 refills | Status: DC | PRN

## 2017-12-21 NOTE — Progress Notes (Signed)
He is going to check on getting a lighter O2 concentrator.  He has the equipment he needs at home.  Still on O2 at baseline.    Diabetes:  Using medications without difficulties: yes Hypoglycemic episodes:no lows "in a long time" Hyperglycemic episodes:no Feet problems:no Blood Sugars averaging: usually ~70-150, occ 200s after more carbs eye exam within last year: due, d/w pt.   A1c up some, d/w pt.    CKD.  Cr stable.  Labs d/w pt.    He needed a refill on flomax.  Done at [facility].    Elevated Cholesterol: Using medications without problems: yes Muscle aches: not worse with statin.   Diet compliance: "could be worse, could be better" Exercise: as tolerated with his back pain.   H/o CAD.  He is more SOB with exertion.  D/w pt- cards had mentioned to patient about pharmacological Myoview if symptoms get worse.  No CP.    Back pain.  Variable, some days worse than others.  No days w/o pain.  Doesn't radiate down the legs as much as prev, but still L>R.   Using pain meds at night prn, not used nightly.  No ADE on med.  He is on bowel regimen to keep from getting constipated. [place] database checked today, appropriate.    Indication for chronic opioid: chronic back pain Medication and dose: oxycodone 15mg  # pills per month: 30 lasting >1 month Last UDS date: 12/11/13 Pain contract signed (Y/N):  yes Date narcotic database last reviewed (include red flags): 12/21/17  PMH and SH reviewed  Meds, vitals, and allergies reviewed.   ROS: Per HPI unless specifically indicated in ROS section   GEN: nad, alert and oriented HEENT: mucous membranes moist, on O2 via [place] NECK: supple w/o LA CV: rrr. PULM: ctab, no inc wob ABD: soft, +bs EXT: trace to 1+ BLE edema at baseline.  SKIN: no acute rash, noninfected seb cyst noted on the back

## 2017-12-21 NOTE — Patient Instructions (Signed)
Plan on a recheck in about 3 months.   Don't change your meds for now.  Update me as needed.  Take care.  Glad to see you.

## 2017-12-23 ENCOUNTER — Telehealth: Payer: Self-pay | Admitting: Family Medicine

## 2017-12-23 DIAGNOSIS — R0602 Shortness of breath: Secondary | ICD-10-CM

## 2017-12-23 DIAGNOSIS — J982 Interstitial emphysema: Secondary | ICD-10-CM | POA: Diagnosis not present

## 2017-12-23 DIAGNOSIS — G8929 Other chronic pain: Secondary | ICD-10-CM | POA: Insufficient documentation

## 2017-12-23 NOTE — Assessment & Plan Note (Addendum)
A1c up some, d/w pt.  I don't want to induce hypoglycemia.  He'll work on diet and we'll recheck in a few months.  He'll update me sooner if needed.  >25 minutes spent in face to face time with patient, >50% spent in counselling or coordination of care, discussing DM2, pain, SOB, etc.

## 2017-12-23 NOTE — Assessment & Plan Note (Signed)
He is more SOB with exertion.  D/w pt- cards had mentioned to patient about pharmacological Myoview if symptoms get worse.  No CP.  I'll update cardiology.

## 2017-12-23 NOTE — Assessment & Plan Note (Signed)
LDL at goal, continue work on weight.  No change in statin.

## 2017-12-23 NOTE — Assessment & Plan Note (Signed)
Indication for chronic opioid: chronic back pain Medication and dose: oxycodone 15mg  # pills per month: 30 lasting >1 month Last UDS date: 12/11/13 Pain contract signed (Y/N):  yes Date narcotic database last reviewed (include red flags): 12/21/17

## 2017-12-23 NOTE — Telephone Encounter (Signed)
I need your input.   He is more SOB with exertion. Your notes mentioned a possible pharmacological Myoview if symptoms get worse.  He isn't having CP.  He (and I) wanted your input.  If you contact the patient, please have staff call the patient directly, not his wife.  This was per patient request.  Many thanks.

## 2017-12-23 NOTE — Assessment & Plan Note (Signed)
With chronic O2 use.  He is going to check on getting a lighter O2 concentrator.  He has the equipment he needs at home.  Still on O2 at baseline.

## 2017-12-23 NOTE — Assessment & Plan Note (Signed)
Cr stable.  Labs d/w pt.   Avoiding nsaids.

## 2017-12-23 NOTE — Assessment & Plan Note (Signed)
Variable, some days worse than others.  No days w/o pain.  Doesn't radiate down the legs as much as prev, but still L>R.   Using pain meds at night prn, not used nightly.  No ADE on med.  He is on bowel regimen to keep from getting constipated. [place] database checked today, appropriate.  Continue as is.  He agrees.

## 2017-12-26 NOTE — Telephone Encounter (Signed)
We can certainly order a lexiscan myoview for SOB, unable to treadmill, known CAD, prior MI and stent Would talk to patient, not his wife Can we call to set up/ thx TGollan

## 2017-12-27 NOTE — Telephone Encounter (Signed)
Spoke with patient and reviewed recommendations for stress testing. He verbalized understanding and scheduled him to have lexiscan on 01/01/18 at 08:00 AM with arrival of 07:45 AM over at the [facility] entrance. Reviewed all instructions with him and he verbalized understanding with no further questions at this time.

## 2017-12-27 NOTE — Addendum Note (Signed)
Addended by: Valora Corporal on: 12/27/2017 03:18 PM   Modules accepted: Orders

## 2017-12-27 NOTE — Telephone Encounter (Signed)
Noted. Thanks.

## 2017-12-28 ENCOUNTER — Other Ambulatory Visit: Payer: Self-pay | Admitting: Cardiovascular Disease

## 2017-12-28 ENCOUNTER — Other Ambulatory Visit: Payer: Self-pay | Admitting: Family Medicine

## 2017-12-28 NOTE — Telephone Encounter (Signed)
Electronic refill request. Gabapentin Last office visit:   12/21/17 Last Filled:     225 tablet 3 10/25/2016  Please advise.

## 2017-12-30 NOTE — Telephone Encounter (Signed)
Sent. Thanks.   

## 2018-01-01 ENCOUNTER — Encounter
Admission: RE | Admit: 2018-01-01 | Discharge: 2018-01-01 | Disposition: A | Discharge status: Home / Self Care | Payer: Medicare HMO | Source: Ambulatory Visit | Attending: Cardiovascular Disease | Admitting: Cardiovascular Disease

## 2018-01-01 DIAGNOSIS — R0602 Shortness of breath: Secondary | ICD-10-CM | POA: Diagnosis not present

## 2018-01-01 MED ORDER — TECHNETIUM TC 99M TETROFOSMIN IV KIT
30.5000 | PACK | Freq: Once | INTRAVENOUS | Status: AC | PRN
  Administered 2018-01-01: 30.5 via INTRAVENOUS

## 2018-01-01 MED ORDER — TECHNETIUM TC 99M TETROFOSMIN IV KIT
13.8400 | PACK | Freq: Once | INTRAVENOUS | Status: AC | PRN
  Administered 2018-01-01: 13.84 via INTRAVENOUS

## 2018-01-01 MED ORDER — REGADENOSON 0.4 MG/5ML IV SOLN
0.4000 mg | Freq: Once | INTRAVENOUS | Status: AC
Start: 2018-01-01 — End: 2018-01-01
  Administered 2018-01-01: 0.4 mg via INTRAVENOUS
  Filled 2018-01-01: qty 5

## 2018-01-02 LAB — NM MYOCAR MULTI W/SPECT W/WALL MOTION / EF
LV dias vol: 169 mL (ref 62–150)
LV sys vol: 80 mL
Peak HR: 71 {beats}/min
Percent HR: 47 %
Rest HR: 50 {beats}/min
TID: 0.93

## 2018-01-04 ENCOUNTER — Telehealth: Payer: Self-pay | Admitting: Cardiovascular Disease

## 2018-01-04 NOTE — Progress Notes (Signed)
Cardiology Office Note  Date:  01/07/2018   ID:  Stephen [name], DOB June 10, 1946, MRN [id number]  PCP:  [name], MD   Chief Complaint  Patient presents with  . Other    Follow up from [facility]. Meds reviewed by the pt. verbally. Pt. c/o chest tightness at times.     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 Status post stent to his left renal artery for 80+ percent stenosis several weeks ago, performed by Dr. Lucky Cowboy. laser treatment of the lower extremity veins for leg swelling/venous insufficiency who presents for routine followup of his coronary artery disease  And Shortness of breath  Stress test ordered for shortness of breath Mages pulled up and discussed with him  defect lateral wall Known occluded left circumflex vessel Ejection fraction 30-45%  Recent chest tightness In the setting of frustration over getting his refrigerator fixed Denies any significant chest pain on regular exertion Does not think his symptoms may have changed much in the past year Chronic shortness of breath No regular exercise Chronic leg edema  Lab work discussed with him HBA1C 8.3 Torsemide 40 mg alternating with 20 mg  CR 1.82, BUN 21 Tolerating zetia, total cholesterol now 92  Cath 01/11/2017, occluded LCX, with collaterals Distal RCA stenosis in-stent 70%, medical management at this time  Echocardiogram ejection fraction 50-55%  EKG personally reviewed by myself on todays visit Shows normal sinus rhythm rate 55 bpm left axis deviation unable to exclude old anteroseptal MI, left axis deviation left anterior  fascicular block  Other past medical history Stress test July 2011 was dobutamine stress that showed no significant ischemia, inadequate heart rate was achieved. right and left heart catheterization January 2011 showed 50% mid LAD disease, 60% at the ostium of the diagonal #2, 30% proximal left circumflex disease, patent stent in the left circumflex, patent stent of the mid RCA, wedge pressure of 16, PA pressure mean 22, right ventricular pressure 41/8 .  echocardiogram September 2010 shows normal systolic function, diastolic dysfunction, normal RV function unable to evaluate right ventricular systolic pressures  PMH:   has a past medical history of Adrenal gland anomaly, Arthritis, CAD (coronary artery disease), CKD (chronic kidney disease), stage III ([place]), Colon polyps, Diabetes mellitus with neuropathy ([place]), DM2 (diabetes mellitus, type 2) ([place]), Dyslipidemia, GERD (gastroesophageal reflux disease), History of echocardiogram, HTN (hypertension), Kidney stones, Left Renal artery stenosis ([place]), Myocardial infarction ([place]), OSA (obstructive sleep apnea), Pulmonary fibrosis ([place]), Recurrent UTI, Rotator cuff disorder, Skin cancer, Thyroid disorder, and Venous insufficiency of both lower extremities.  PSH:    Past Surgical History:  Procedure Laterality Date  . adrenal adenoma removal  1990-   right  . CARPAL TUNNEL RELEASE     bilateral  . CORONARY ANGIOPLASTY WITH STENT PLACEMENT  2007   x 2  . KNEE SURGERY     right  . LEFT HEART CATH AND CORONARY ANGIOGRAPHY N/A 01/11/2017   Procedure: Left Heart Cath and Coronary Angiography;  Surgeon: Wellington Hampshire, MD;  Location: [facility];  Service: Cardiovascular;  Laterality: N/A;  . LUNG BIOPSY    . RENAL ARTERY STENT  2015   L  . SKIN CANCER EXCISION     back of head    Current Outpatient Medications  Medication Sig Dispense Refill  . ACCU-CHEK SOFTCLIX LANCETS lancets Use as instructed to check blood sugar three times  daily and as needed.  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 100 each 5  . Alcohol Swabs (B-D SINGLE USE SWABS REGULAR) PADS Use to cleans area prior to checking blood sugar three times daily and as needed.  Diagnosis:  E11.22  Insulin dependent. 100 each 5  . amLODipine (NORVASC) 5 MG tablet TAKE 1 TABLET (5 MG TOTAL) BY MOUTH DAILY. 90 tablet 3  . aspirin 81 MG tablet Take 324 mg by mouth daily.     Marland Kitchen atorvastatin (LIPITOR) 80 MG tablet TAKE 1 TABLET AT BEDTIME 90 tablet 2  . Blood Glucose Monitoring Suppl (ACCU-CHEK AVIVA PLUS) w/Device KIT Use to check blood sugar three times daily and as needed.  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 carvedilol (COREG) 25 MG tablet TAKE 1 TABLET TWICE DAILY WITH MEALS 180 tablet 2  . cloNIDine (CATAPRES) 0.3 MG tablet TAKE 1 TABLET THREE TIMES DAILY 270 tablet 3  . clopidogrel (PLAVIX) 75 MG tablet Take 1 tablet (75 mg total) by mouth daily. 90 tablet 3  . clotrimazole-betamethasone (LOTRISONE) cream Apply 1 application topically 2 (two) times daily. 30 g 0  . diphenhydrAMINE (BENADRYL) 50 MG tablet Take 50 mg by mouth at bedtime as needed. Pt takes every night    . docusate sodium (COLACE) 100 MG capsule Take 100 mg by mouth 2 (two) times daily. As needed.    . gabapentin (NEURONTIN) 800 MG tablet TAKE 1 TABLET IN THE MORNING  AND TAKE 1 AND 1/2 TABLETS AT BEDTIME 225 tablet 3  . glucose blood (ACCU-CHEK AVIVA PLUS) test strip Use as instructed to check blood sugar 3 times daily or as needed.  Diagnosis:  E11.22  Insulin-dependent. 300 each 1  . insulin NPH-regular Human (NOVOLIN 70/30) (70-30) 100 UNIT/ML injection INJECT  70 UNITS SUBCUTANEOUSLY EVERY MORNING  AND  50 units at bedtime 10 vial 1  . Insulin Syringe-Needle U-100 (INSULIN SYRINGE 1CC/30GX5/16") 30G X 5/16" 1 ML MISC Use as directed to take insulin Dx 250.62 180 each 1  . isosorbide mononitrate (IMDUR) 60 MG 24 hr tablet Take 1.5 tablets (90 mg total) by mouth 2 (two) times daily. 270  tablet 3  . nitroGLYCERIN (NITROSTAT) 0.4 MG SL tablet Place 1 tablet (0.4 mg total) under the tongue every 5 (five) minutes as needed. May repeat x3 25 tablet prn  . NON FORMULARY Oxygen 2 liters 24/7    . omeprazole (PRILOSEC) 20 MG capsule Take 20 mg by mouth daily.      Marland Kitchen oxyCODONE (ROXICODONE) 15 MG immediate release tablet Take 1 tablet (15 mg total) by mouth at bedtime as needed for pain. 30 tablet 0  . tamsulosin (FLOMAX) 0.4 MG CAPS capsule Take 1 capsule (0.4 mg total) by mouth at bedtime. 30 capsule 0  . torsemide (DEMADEX) 20 MG tablet TAKE 2 TABLETS EVERY DAY 180 tablet 3  . ezetimibe (ZETIA) 10 MG tablet Take 1 tablet (10 mg total) by mouth daily. 90 tablet 3   No current facility-administered medications for this visit.      Allergies:   Calcium channel blockers   Social History:  The patient  reports that he has never smoked. He has never used smokeless tobacco. He reports that he does not drink alcohol  or use drugs.   Family History:   family history includes Arthritis in his maternal grandmother; Breast cancer in his maternal aunt; COPD in his sister; Diabetes in his maternal aunt; Heart attack in his brother, father, and mother; Hypertension in his maternal aunt and other.    Review of Systems: Review of Systems  Constitutional: Negative.   Respiratory: Positive for shortness of breath.   Cardiovascular: Positive for leg swelling.  Gastrointestinal: Negative.   Musculoskeletal: Negative.   Neurological: Negative.   Psychiatric/Behavioral: Negative.   All other systems reviewed and are negative.    PHYSICAL EXAM: VS:  BP 120/60 (BP Location: Left Arm, Patient Position: Sitting, Cuff Size: Large)   Pulse (!) 55   Ht '5\' 5"'$  (1.651 m)   Wt 263 lb (119.3 kg)   BMI 43.77 kg/m  , BMI Body mass index is 43.77 kg/m.  No significant change in exam Constitutional:  oriented to person, place, and time. No distress.  HENT:  Head: Normocephalic and atraumatic.  Eyes:   no discharge. No scleral icterus.  Neck: Normal range of motion. Neck supple. No JVD present.  Cardiovascular: Normal rate, regular rhythm, normal heart sounds and intact distal pulses. Exam reveals no gallop and no friction rub.  1+ trace pitting lower extremity edema, compression hose in place No murmur heard. Pulmonary/Chest: Moderately decreased breath sounds throughout , no stridor. No respiratory distress.  no wheezes.  no rales.  no tenderness.  Abdominal: Soft.  no distension.  no tenderness.  Musculoskeletal: Normal range of motion.  no  tenderness or deformity.  Neurological:  normal muscle tone. Coordination normal. No atrophy Skin: Skin is warm and dry. No rash noted. not diaphoretic.  Psychiatric:  normal mood and affect. behavior is normal. Thought content normal.   Recent Labs: 01/16/2017: Magnesium 1.9 01/22/2017: Hemoglobin 11.8; Platelets 344.0 12/18/2017: ALT 18; BUN 21; Creatinine, Ser 1.82; Potassium 3.7; Sodium 142    Lipid Panel Lab Results  Component Value Date   CHOL 91 12/18/2017   HDL 23.80 (L) 12/18/2017   LDLCALC 45 12/18/2017   TRIG 111.0 12/18/2017      Wt Readings from Last 3 Encounters:  01/07/18 263 lb (119.3 kg)  12/21/17 260 lb 8 oz (118.2 kg)  10/06/17 264 lb (119.7 kg)       ASSESSMENT AND PLAN:  Atherosclerosis of native coronary artery of native heart without angina pectoris -  Chronic stable angina Chronic shortness of breath secondary to underlying lung disease Discuss stress test with him showing lateral wall fixed defect No large regions of ischemia  images pulled up and discussed with him He has not noticed dramatic change in his symptoms Recommended he call us if he would like cardiac catheterization  Essential hypertension, benign - Plan: EKG 12-Lead Blood pressure is well controlled on today's visit. No changes made to the medications.  Chronic diastolic CHF (congestive heart failure) (HCC) - Plan: EKG 12-Lead As above  will alternate torsemide 40 with 20 Stable weight Renal function stable  Mixed hyperlipidemia Continue Zetia  Lipitor Goal LDL less than 70 Numbers at goal  Renal artery stenosis, native [facility]) followed by Dr. Lucky Cowboy  Chronic edema Recommend he use his lymphedema compression pumps, compression hose stable  Morbid obesity ([place]) Difficult time losing weight Unable to exercise. Recommended low carbohydrate diet  PULMONARY FIBROSIS On and off oxygen Followed by pulmonary Stable symptoms  Type 2 diabetes mellitus with chronic kidney disease, with long-term current use of insulin, unspecified CKD stage ([place])  Hemoglobin A1c greater than 8 Stressed importance of aggressive control  Chronic kidney disease, stage III (moderate) Followed by Dr. Holley Raring   Total encounter time more than 25 minutes  Greater than 50% was spent in counseling and coordination of care with the patient   Disposition:   F/U  6 months   Orders Placed This Encounter  Procedures  . EKG 12-Lead     Signed, Esmond Plants, M.D., Ph.D. 01/07/2018  [place], [place]

## 2018-01-04 NOTE — Telephone Encounter (Signed)
Patient calling to check status of stress test results Please call when ready

## 2018-01-04 NOTE — Telephone Encounter (Signed)
The patient is aware of his myoview results and that Dr. Rockey Situ has recommended that he come in to discuss the images and plan going forward. The patient is agreeable. I have scheduled him to be seen on Monday 01/07/18 at 10:00 am with Dr. Rockey Situ.

## 2018-01-05 DIAGNOSIS — J449 Chronic obstructive pulmonary disease, unspecified: Secondary | ICD-10-CM | POA: Diagnosis not present

## 2018-01-07 ENCOUNTER — Encounter: Payer: Self-pay | Admitting: Cardiovascular Disease

## 2018-01-07 ENCOUNTER — Ambulatory Visit: Payer: 59 | Admitting: Cardiovascular Disease

## 2018-01-07 VITALS — BP 120/60 | HR 55 | Ht 65.0 in | Wt 263.0 lb

## 2018-01-07 DIAGNOSIS — N183 Chronic kidney disease, stage 3 unspecified: Secondary | ICD-10-CM

## 2018-01-07 DIAGNOSIS — E1122 Type 2 diabetes mellitus with diabetic chronic kidney disease: Secondary | ICD-10-CM | POA: Diagnosis not present

## 2018-01-07 DIAGNOSIS — I5032 Chronic diastolic (congestive) heart failure: Secondary | ICD-10-CM

## 2018-01-07 DIAGNOSIS — J8410 Pulmonary fibrosis, unspecified: Secondary | ICD-10-CM | POA: Diagnosis not present

## 2018-01-07 DIAGNOSIS — I110 Hypertensive heart disease with heart failure: Secondary | ICD-10-CM

## 2018-01-07 DIAGNOSIS — I10 Essential (primary) hypertension: Secondary | ICD-10-CM | POA: Diagnosis not present

## 2018-01-07 DIAGNOSIS — I5030 Unspecified diastolic (congestive) heart failure: Secondary | ICD-10-CM

## 2018-01-07 DIAGNOSIS — Z794 Long term (current) use of insulin: Secondary | ICD-10-CM

## 2018-01-07 DIAGNOSIS — I25119 Atherosclerotic heart disease of native coronary artery with unspecified angina pectoris: Secondary | ICD-10-CM

## 2018-01-07 NOTE — Patient Instructions (Signed)

## 2018-01-22 DIAGNOSIS — R600 Localized edema: Secondary | ICD-10-CM | POA: Diagnosis not present

## 2018-01-22 DIAGNOSIS — N2581 Secondary hyperparathyroidism of renal origin: Secondary | ICD-10-CM | POA: Diagnosis not present

## 2018-01-22 DIAGNOSIS — N183 Chronic kidney disease, stage 3 (moderate): Secondary | ICD-10-CM | POA: Diagnosis not present

## 2018-01-22 DIAGNOSIS — E1122 Type 2 diabetes mellitus with diabetic chronic kidney disease: Secondary | ICD-10-CM | POA: Diagnosis not present

## 2018-01-22 DIAGNOSIS — J982 Interstitial emphysema: Secondary | ICD-10-CM | POA: Diagnosis not present

## 2018-01-22 DIAGNOSIS — I10 Essential (primary) hypertension: Secondary | ICD-10-CM | POA: Diagnosis not present

## 2018-01-22 DIAGNOSIS — R809 Proteinuria, unspecified: Secondary | ICD-10-CM | POA: Diagnosis not present

## 2018-02-05 DIAGNOSIS — J449 Chronic obstructive pulmonary disease, unspecified: Secondary | ICD-10-CM | POA: Diagnosis not present

## 2018-02-14 ENCOUNTER — Ambulatory Visit: Payer: Self-pay | Admitting: Family Medicine

## 2018-02-14 NOTE — Telephone Encounter (Signed)
Phone call from pt.  Reported he noticed a "bluish- black" discoloration of the 1st-4th toes of right foot, this afternoon.  stated "my pinkie toe looks fine."  Denied change in temperature of the right foot or toes.  Denied foot pain, but stated his "toes are sore to touch, and have been sore for past month."  Stated he can wiggle the right foot toes, but "they feel a little tight when trying to bend them."  Denied any change in color or temperature of the foot or leg. Denied any redness, swelling or open sores of right foot.  Stated he has diabetic neuropathy.  Reported his wife checks his feet daily, and when she applied his support stockings this morning, the toes were not this color.  Appt. Scheduled for 11:15 AM, 8/16 at PCP office.  Care advice given per protocol.  Advised to go to ER if right foot temperature becomes cold, onset of severe pain in the toes, or inability to move the right foot/ toes.  Verb. Understanding.  Agrees with plan.      Reason for Disposition . Foot or toe pain  Answer Assessment - Initial Assessment Questions 1. SYMPTOM: "What's the main symptom you're concerned about?" (e.g., rash, sore, callus, drainage, numbness)     Black and blue discoloration 2. LOCATION: "Where is the  problem located?" (e.g., foot/toe, top/bottom, left/right)    Right foot toes - 1st through the 4th toe 3. ONSET: "When did the symptoms start?"     About 45 minutes ago 4. PAIN: "Is there any pain?" If so, ask: "How bad is it?" (Scale: 1-10; mild, moderate, severe)     Sore/tender to touch 5. CAUSE: "What do you think is causing the symptoms?"     unknown 6. OTHER SYMPTOMS: "Do you have any other symptoms?" (e.g., fever, weakness)     Denied any open sores of feet or toes; denied redness or swelling; able to move toes with some limitation  7. PREGNANCY: "Is there any chance you are pregnant?" "When was your last menstrual period?"     N/a  Protocols used: DIABETES - [place]

## 2018-02-15 ENCOUNTER — Encounter: Payer: Self-pay | Admitting: Internal Medicine

## 2018-02-15 ENCOUNTER — Ambulatory Visit (INDEPENDENT_AMBULATORY_CARE_PROVIDER_SITE_OTHER): Payer: Medicare HMO | Admitting: Internal Medicine

## 2018-02-15 VITALS — BP 122/64 | HR 55 | Temp 97.6°F | Ht 65.0 in | Wt 262.0 lb

## 2018-02-15 DIAGNOSIS — S9031XA Contusion of right foot, initial encounter: Secondary | ICD-10-CM

## 2018-02-15 DIAGNOSIS — S9030XA Contusion of unspecified foot, initial encounter: Secondary | ICD-10-CM | POA: Insufficient documentation

## 2018-02-15 NOTE — Telephone Encounter (Signed)
Will evaluate at OV Hopefully not macrovascular

## 2018-02-15 NOTE — Telephone Encounter (Signed)
Pt has appt with Dr Silvio Pate 02/15/18 at 11:15.

## 2018-02-15 NOTE — Assessment & Plan Note (Signed)
Reassured--not vascular Must have had some injury without remembering  Discussed symptomatic care---padding prn

## 2018-02-15 NOTE — Progress Notes (Signed)
Subjective:    Patient ID: Stephen [name], male    DOB: 07-11-1945, 72 y.o.   MRN: [id number]  HPI Here due to changes in his right toes With wife  Suddenly noticed blue discoloration in toes of right foot (2-4th mostly) in shower Thought it might have gotten stained--but couldn't wipe it off Some pain No injury Wears shoes all the time--slippers in house  Slight pain in great toe  Current Outpatient Medications on File Prior to Visit  Medication Sig Dispense Refill  . ACCU-CHEK SOFTCLIX LANCETS lancets Use as instructed to check blood sugar three times daily and as needed.  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 100 each 5  . Alcohol Swabs (B-D SINGLE USE SWABS REGULAR) PADS Use to cleans area prior to checking blood sugar three times daily and as needed.  Diagnosis:  E11.22  Insulin dependent. 100 each 5  . amLODipine (NORVASC) 5 MG tablet TAKE 1 TABLET (5 MG TOTAL) BY MOUTH DAILY. 90 tablet 3  . aspirin 81 MG tablet Take 324 mg by mouth daily.     Marland Kitchen atorvastatin (LIPITOR) 80 MG tablet TAKE 1 TABLET AT BEDTIME 90 tablet 2  . Blood Glucose Monitoring Suppl (ACCU-CHEK AVIVA PLUS) w/Device KIT Use to check blood sugar three times daily and as needed.  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 carvedilol (COREG) 25 MG tablet TAKE 1 TABLET TWICE DAILY WITH MEALS 180 tablet 2  . cloNIDine (CATAPRES) 0.3 MG tablet TAKE 1 TABLET THREE TIMES DAILY 270 tablet 3  . clopidogrel (PLAVIX) 75 MG tablet Take 1 tablet (75 mg total) by mouth daily. 90 tablet 3  . clotrimazole-betamethasone (LOTRISONE) cream Apply 1 application topically 2 (two) times daily. 30 g 0  . diphenhydrAMINE (BENADRYL) 50 MG tablet Take 50 mg by mouth at bedtime as needed. Pt takes every night    . docusate sodium (COLACE) 100 MG capsule Take 100 mg by mouth 2 (two) times daily. As needed.    . gabapentin (NEURONTIN) 800 MG tablet TAKE 1 TABLET IN THE MORNING  AND TAKE 1 AND 1/2 TABLETS AT BEDTIME 225 tablet 3  .  glucose blood (ACCU-CHEK AVIVA PLUS) test strip Use as instructed to check blood sugar 3 times daily or as needed.  Diagnosis:  E11.22  Insulin-dependent. 300 each 1  . insulin NPH-regular Human (NOVOLIN 70/30) (70-30) 100 UNIT/ML injection INJECT  70 UNITS SUBCUTANEOUSLY EVERY MORNING  AND  50 units at bedtime 10 vial 1  . Insulin Syringe-Needle U-100 (INSULIN SYRINGE 1CC/30GX5/16") 30G X 5/16" 1 ML MISC Use as directed to take insulin Dx 250.62 180 each 1  . isosorbide mononitrate (IMDUR) 60 MG 24 hr tablet Take 1.5 tablets (90 mg total) by mouth 2 (two) times daily. 270 tablet 3  . nitroGLYCERIN (NITROSTAT) 0.4 MG SL tablet Place 1 tablet (0.4 mg total) under the tongue every 5 (five) minutes as needed. May repeat x3 25 tablet prn  . NON FORMULARY Oxygen 2 liters 24/7    . omeprazole (PRILOSEC) 20 MG capsule Take 20 mg by mouth daily.      Marland Kitchen oxyCODONE (ROXICODONE) 15 MG immediate release tablet Take 1 tablet (15 mg total) by mouth at bedtime as needed for pain. 30 tablet 0  . tamsulosin (FLOMAX) 0.4 MG CAPS capsule Take 1 capsule (0.4 mg total) by mouth at bedtime. 30 capsule 0  . torsemide (DEMADEX) 20 MG tablet TAKE 2 TABLETS EVERY DAY 180 tablet 3  . ezetimibe (ZETIA) 10 MG  tablet Take 1 tablet (10 mg total) by mouth daily. 90 tablet 3   No current facility-administered medications on file prior to visit.     Allergies  Allergen Reactions  . Calcium Channel Blockers     Would avoid if possible due to h/o peripheral edema    Past Medical History:  Diagnosis Date  . Adrenal gland anomaly   . Arthritis   . CAD (coronary artery disease)    a. 04/2006 MI and PCI/stenting to mLCx & mRCA; b. 07/2009 Cath: patent LCX/RCA stents;  c. 12/2016 NSTEMI/Cath: LM 30, LAD 30p, 71m 30d, D1/2/3 min irregs, LCX 1034mSR, OM1 min irregs, RCA 20p, 10 ISR, 7065m0d, RPDA min irregs, RPAV 40-->Med Rx.  . CKD (chronic kidney disease), stage III ([place] . Colon polyps   . Diabetes mellitus with neuropathy  ([place] . DM2 (diabetes mellitus, type 2) (HCC)    insulin requiring  . Dyslipidemia   . GERD (gastroesophageal reflux disease)   . History of echocardiogram    a. TTE 12/2016: EF 50-55%, mod concentric LVH, images inadequate for wall motion assessment, not technically sufficienct to allow for LV dias fxn, calcified mitral annulus   . HTN (hypertension)   . Kidney stones   . Left Renal artery stenosis ([place]  a. 07/2013 s/p PTA/stenting ([place]).  . Myocardial infarction ([place] . OSA (obstructive sleep apnea)   . Pulmonary fibrosis ([place]  a. 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  . Recurrent UTI   . Rotator cuff disorder    right  . Skin cancer    head  . Thyroid disorder   . Venous insufficiency of both lower extremities    a. s/p laser treatment.    Past Surgical History:  Procedure Laterality Date  . adrenal adenoma removal  1990-   right  . CARPAL TUNNEL RELEASE     bilateral  . CORONARY ANGIOPLASTY WITH STENT PLACEMENT  2007   x 2  . KNEE SURGERY     right  . LEFT HEART CATH AND CORONARY ANGIOGRAPHY N/A 01/11/2017   Procedure: Left Heart Cath and Coronary Angiography;  Surgeon: AriWellington HampshireD;  [facility];  Service: Cardiovascular;  Laterality: N/A;  . LUNG BIOPSY    . RENAL ARTERY STENT  2015   L  . SKIN CANCER EXCISION     back of head    Family History  Problem Relation Age of Onset  . Heart attack Father   . Heart attack Mother   . Heart attack Brother   . COPD Sister   . Arthritis Maternal Grandmother   . Breast cancer Maternal Aunt   . Diabetes Maternal Aunt   . Hypertension Other   . Hypertension Maternal Aunt   . Prostate cancer Neg Hx   . Kidney cancer Neg Hx   . Bladder Cancer Neg Hx   . Colon cancer Neg Hx     Social History   Socioeconomic History  . Marital status: Married    Spouse name: Not on file  . Number of children: 2  . Years of education: Not on  file  . Highest education level: Not on file  Occupational History  . Occupation: retired from service station work, then [employer]    Comment: significant metal dust exposure, significant asbestos exposure    Employer: DISABLED  Social Needs  . Financial resource strain: Not  on file  . Food insecurity:    Worry: Not on file    Inability: Not on file  . Transportation needs:    Medical: Not on file    Non-medical: Not on file  Tobacco Use  . Smoking status: Never Smoker  . Smokeless tobacco: Never Used  Substance and Sexual Activity  . Alcohol use: No    Alcohol/week: 0.0 standard drinks  . Drug use: No  . Sexual activity: Yes  Lifestyle  . Physical activity:    Days per week: Not on file    Minutes per session: Not on file  . Stress: Not on file  Relationships  . Social connections:    Talks on phone: Not on file    Gets together: Not on file    Attends religious service: Not on file    Active member of club or organization: Not on file    Attends meetings of clubs or organizations: Not on file    Relationship status: Not on file  . Intimate partner violence:    Fear of current or ex partner: Not on file    Emotionally abused: Not on file    Physically abused: Not on file    Forced sexual activity: Not on file  Other Topics Concern  . Not on file  Social History Narrative   Enjoys fishing   [employer] fan   Review of Systems  No fever Doesn't feel sick     Objective:   Physical Exam  Constitutional: No distress.  Musculoskeletal:  Purplish discoloration at base of right 2nd, 3rd and 4th toes Mild tenderness over base of those toes but not #1 or #5 No apparent metatarsal tenderness Normal DP pulse in right foot           Assessment & Plan:

## 2018-02-22 ENCOUNTER — Other Ambulatory Visit: Payer: Self-pay | Admitting: *Deleted

## 2018-02-22 DIAGNOSIS — J982 Interstitial emphysema: Secondary | ICD-10-CM | POA: Diagnosis not present

## 2018-02-22 NOTE — Telephone Encounter (Addendum)
Name of Medication: Oxycodone Name of Pharmacy: [facility], [place] Last Fill or Written Date and Quantity:  30 tablet 0 12/21/2017  Last Office Visit and Type: 02/15/18 Acute - Dr. Silvio Pate Next Office Visit and Type: 03/25/18 3 mos FU Last Controlled Substance Agreement Date: 12/11/13 Last UDS:  12/11/13

## 2018-02-24 MED ORDER — OXYCODONE HCL 15 MG PO TABS: 15.0000 mg | ORAL_TABLET | Freq: Every evening | ORAL | 0 refills | Status: DC | PRN

## 2018-02-24 NOTE — Telephone Encounter (Signed)
Okay to continue med.   I am unable to send controlled meds electronically- the program to allow that is current not functional.  He'll have to pick up rx.  I am working to get the program corrected.   Printed.  Thanks.

## 2018-02-25 NOTE — Telephone Encounter (Signed)
Wife advised.  Rx left at front desk for pick up.

## 2018-02-27 DIAGNOSIS — E1165 Type 2 diabetes mellitus with hyperglycemia: Secondary | ICD-10-CM | POA: Diagnosis not present

## 2018-02-27 LAB — HM DIABETES EYE EXAM

## 2018-03-05 ENCOUNTER — Encounter: Payer: Self-pay | Admitting: Ophthalmology

## 2018-03-08 DIAGNOSIS — J449 Chronic obstructive pulmonary disease, unspecified: Secondary | ICD-10-CM | POA: Diagnosis not present

## 2018-03-25 ENCOUNTER — Encounter: Payer: Self-pay | Admitting: Family Medicine

## 2018-03-25 ENCOUNTER — Ambulatory Visit (INDEPENDENT_AMBULATORY_CARE_PROVIDER_SITE_OTHER): Payer: Medicare HMO | Admitting: Family Medicine

## 2018-03-25 VITALS — BP 124/68 | HR 57 | Temp 98.3°F | Ht 65.0 in | Wt 263.5 lb

## 2018-03-25 DIAGNOSIS — Z794 Long term (current) use of insulin: Secondary | ICD-10-CM

## 2018-03-25 DIAGNOSIS — J982 Interstitial emphysema: Secondary | ICD-10-CM | POA: Diagnosis not present

## 2018-03-25 DIAGNOSIS — Z23 Encounter for immunization: Secondary | ICD-10-CM

## 2018-03-25 DIAGNOSIS — M25569 Pain in unspecified knee: Secondary | ICD-10-CM | POA: Diagnosis not present

## 2018-03-25 DIAGNOSIS — G8929 Other chronic pain: Secondary | ICD-10-CM | POA: Diagnosis not present

## 2018-03-25 DIAGNOSIS — M5442 Lumbago with sciatica, left side: Secondary | ICD-10-CM | POA: Diagnosis not present

## 2018-03-25 DIAGNOSIS — N183 Chronic kidney disease, stage 3 unspecified: Secondary | ICD-10-CM

## 2018-03-25 DIAGNOSIS — E1122 Type 2 diabetes mellitus with diabetic chronic kidney disease: Secondary | ICD-10-CM | POA: Diagnosis not present

## 2018-03-25 LAB — POCT GLYCOSYLATED HEMOGLOBIN (HGB A1C): Hemoglobin A1C: 7.7 % — AB (ref 4.0–5.6)

## 2018-03-25 MED ORDER — OXYCODONE HCL 15 MG PO TABS: 15.0000 mg | ORAL_TABLET | Freq: Every evening | ORAL | 0 refills | Status: DC | PRN

## 2018-03-25 MED ORDER — DICLOFENAC SODIUM 1 % TD GEL: 2.0000 g | Freq: Two times a day (BID) | TRANSDERMAL | 1 refills | Status: DC | PRN

## 2018-03-25 NOTE — Patient Instructions (Signed)
Recheck in about 3 months.  The only lab you need to have done for your next diabetic visit is an A1c.  We can do this with a fingerstick test at the office visit.  You do not need a lab visit ahead of time for this.  It does not matter if you are fasting when the lab is done.  Try to take a snack to keep your sugar from going low.  Update me as needed.  Take care.  Glad to see you.  Try the diclofenac gel for knee pain.

## 2018-03-25 NOTE — Progress Notes (Signed)
Leg and back pain is still worse at night.  Has used oxycodone as needed.  No adverse effect of medication other than some tendency towards constipation.   Back pain some better with oxycodone with bowel regimen.  He had some knee pain recently.  Asking about options. Worse with weather changes.    Diabetes:  Using medications without difficulties: yes Hypoglycemic episodes: rare lower sugars, cautions d/w pt about avoiding prolonged fasting.   Hyperglycemic episodes:no Feet problems: tingling at basleine.  Blood Sugars averaging: ~120 in the AM.   eye exam within last year: yes POC A1c done at OV.  Improved to 7.7.    He has some nausea w/o vomiting, intermittently.    He has cataract f/u pending at the eye clinic.    Meds, vitals, and allergies reviewed.   ROS: Per HPI unless specifically indicated in ROS section   GEN: nad, alert and oriented, on oxygen at baseline.   HEENT: mucous membranes moist NECK: supple w/o LA CV: rrr. PULM: ctab, no inc wob ABD: soft, +bs EXT: 1-2+ BLE edema at baseline.  L knee medially ttp at joint line with crepitus on ROM.

## 2018-03-26 DIAGNOSIS — M25569 Pain in unspecified knee: Secondary | ICD-10-CM | POA: Insufficient documentation

## 2018-03-26 NOTE — Assessment & Plan Note (Signed)
A1c discussed with patient at office visit.  Improved to 7.7.  No change in insulin at this point.  Cautions discussed with patient about avoiding prolonged fasting to limit hypoglycemia.  Recheck periodically.  He agrees.  Continue work on diet and exercise as best he can.  He agrees. See avs.

## 2018-03-26 NOTE — Assessment & Plan Note (Signed)
Med options are limited for multiple reasons.  Would try to avoid systemic NSAIDs.  May be able to tolerate topical NSAID use as needed and about options.  Can try diclofenac gel.  Prescription sent.

## 2018-03-26 NOTE — Assessment & Plan Note (Signed)
Continue stretching and continue using oxycodone as needed at bedtime.  Continue bowel regimen.  No sedation on medication.  Okay for outpatient follow-up.  He agrees.

## 2018-04-05 DIAGNOSIS — E113213 Type 2 diabetes mellitus with mild nonproliferative diabetic retinopathy with macular edema, bilateral: Secondary | ICD-10-CM | POA: Diagnosis not present

## 2018-04-07 DIAGNOSIS — J449 Chronic obstructive pulmonary disease, unspecified: Secondary | ICD-10-CM | POA: Diagnosis not present

## 2018-04-09 DIAGNOSIS — E113213 Type 2 diabetes mellitus with mild nonproliferative diabetic retinopathy with macular edema, bilateral: Secondary | ICD-10-CM | POA: Diagnosis not present

## 2018-04-24 DIAGNOSIS — J982 Interstitial emphysema: Secondary | ICD-10-CM | POA: Diagnosis not present

## 2018-04-25 DIAGNOSIS — E113213 Type 2 diabetes mellitus with mild nonproliferative diabetic retinopathy with macular edema, bilateral: Secondary | ICD-10-CM | POA: Diagnosis not present

## 2018-05-08 DIAGNOSIS — J449 Chronic obstructive pulmonary disease, unspecified: Secondary | ICD-10-CM | POA: Diagnosis not present

## 2018-05-17 ENCOUNTER — Ambulatory Visit: Payer: Medicare HMO | Admitting: Urology

## 2018-05-23 DIAGNOSIS — E113213 Type 2 diabetes mellitus with mild nonproliferative diabetic retinopathy with macular edema, bilateral: Secondary | ICD-10-CM | POA: Diagnosis not present

## 2018-05-25 DIAGNOSIS — J982 Interstitial emphysema: Secondary | ICD-10-CM | POA: Diagnosis not present

## 2018-05-29 DIAGNOSIS — N2581 Secondary hyperparathyroidism of renal origin: Secondary | ICD-10-CM | POA: Diagnosis not present

## 2018-05-29 DIAGNOSIS — I701 Atherosclerosis of renal artery: Secondary | ICD-10-CM | POA: Diagnosis not present

## 2018-05-29 DIAGNOSIS — N183 Chronic kidney disease, stage 3 (moderate): Secondary | ICD-10-CM | POA: Diagnosis not present

## 2018-05-29 DIAGNOSIS — E1122 Type 2 diabetes mellitus with diabetic chronic kidney disease: Secondary | ICD-10-CM | POA: Diagnosis not present

## 2018-05-29 DIAGNOSIS — R809 Proteinuria, unspecified: Secondary | ICD-10-CM | POA: Diagnosis not present

## 2018-05-29 DIAGNOSIS — I10 Essential (primary) hypertension: Secondary | ICD-10-CM | POA: Diagnosis not present

## 2018-06-06 DIAGNOSIS — E113213 Type 2 diabetes mellitus with mild nonproliferative diabetic retinopathy with macular edema, bilateral: Secondary | ICD-10-CM | POA: Diagnosis not present

## 2018-06-07 DIAGNOSIS — J449 Chronic obstructive pulmonary disease, unspecified: Secondary | ICD-10-CM | POA: Diagnosis not present

## 2018-06-20 DIAGNOSIS — H2511 Age-related nuclear cataract, right eye: Secondary | ICD-10-CM | POA: Diagnosis not present

## 2018-06-20 DIAGNOSIS — H02831 Dermatochalasis of right upper eyelid: Secondary | ICD-10-CM | POA: Diagnosis not present

## 2018-06-20 DIAGNOSIS — H02403 Unspecified ptosis of bilateral eyelids: Secondary | ICD-10-CM | POA: Diagnosis not present

## 2018-06-24 ENCOUNTER — Encounter: Payer: Self-pay | Admitting: Family Medicine

## 2018-06-24 ENCOUNTER — Ambulatory Visit (INDEPENDENT_AMBULATORY_CARE_PROVIDER_SITE_OTHER): Payer: Medicare HMO | Admitting: Family Medicine

## 2018-06-24 VITALS — BP 156/70 | HR 62 | Temp 97.8°F | Resp 16 | Ht 65.0 in | Wt 264.0 lb

## 2018-06-24 DIAGNOSIS — N183 Chronic kidney disease, stage 3 (moderate): Secondary | ICD-10-CM

## 2018-06-24 DIAGNOSIS — Z794 Long term (current) use of insulin: Secondary | ICD-10-CM

## 2018-06-24 DIAGNOSIS — J982 Interstitial emphysema: Secondary | ICD-10-CM | POA: Diagnosis not present

## 2018-06-24 DIAGNOSIS — E1122 Type 2 diabetes mellitus with diabetic chronic kidney disease: Secondary | ICD-10-CM | POA: Diagnosis not present

## 2018-06-24 DIAGNOSIS — G8929 Other chronic pain: Secondary | ICD-10-CM

## 2018-06-24 LAB — POCT GLYCOSYLATED HEMOGLOBIN (HGB A1C): Hemoglobin A1C: 7.6 % — AB (ref 4.0–5.6)

## 2018-06-24 MED ORDER — OXYCODONE HCL 15 MG PO TABS: 15.0000 mg | ORAL_TABLET | Freq: Every evening | ORAL | 0 refills | Status: DC | PRN

## 2018-06-24 NOTE — Progress Notes (Signed)
Diabetes:  Using medications without difficulties: yes Hypoglycemic episodes:no Hyperglycemic episodes:no Feet problems: at baseline with some tingling and pain but better with gabapentin.  Foot care d/w pt.  He checks daily.   Blood Sugars averaging: ~90-125 usually in the AM, was 125 this AM eye exam within last year:yes A1c done at OV.  A1c reasonable at 7.6.  I don't want to induce hypoglycemia.    He had f/u with the eye clinic and has cataract f/u pending.   He is still putting up with back pain especially at night, across the lower B back. Not radiating into leg now.  No new sx.  Not using oxycodone nightly, he uses that sparingly.  On gabapentin at baseline, with some relief.  "if you miss a night, then you know it."  D/w pt about constipation cautions.  D/w pt about either metamucil or miralax use.    Indication for chronic opioid: chronic back pain Medication and dose: oxycodone 15mg  # pills per month: 30 lasting >1 month Last UDS date: 12/11/13 Pain contract signed (Y/N):  yes Date narcotic database last reviewed (include red flags): 06/24/18  He is going to check on pulmonary followup. D/w pt.   He has seen the renal clinic.  I'll defer, he agrees.   Meds, vitals, and allergies reviewed.   ROS: Per HPI unless specifically indicated in ROS section   GEN: nad, alert and oriented, on O2 at baseline.   HEENT: mucous membranes moist NECK: supple w/o LA CV: rrr. PULM: ctab, no inc wob ABD: soft, +bs EXT: 1-2+ edema, at baseline.   SKIN: no acute rash Back not ttp in midline but B lower back ttp w/o rash.

## 2018-06-24 NOTE — Assessment & Plan Note (Signed)
A1c done at OV.  A1c reasonable at 7.6.  I don't want to induce hypoglycemia.  No change in meds at this point.  He'll continue work on diet and he'll update me as needed with the plan to recheck in about 3 months, sooner if needed.  He agrees.

## 2018-06-24 NOTE — Patient Instructions (Addendum)
Try taking either metamucil or miralax as needed for constipation.  You can take either daily.   Update me as needed.  Recheck in about 3 months.  The only lab you need to have done for your next diabetic visit is an A1c.  We can do this with a fingerstick test at the office visit.  You do not need a lab visit ahead of time for this.  It does not matter if you are fasting when the lab is done.   Don't change your other meds for now.  Take care.  Glad to see you.

## 2018-06-24 NOTE — Assessment & Plan Note (Addendum)
  Indication for chronic opioid: chronic back pain Medication and dose: oxycodone 15mg  # pills per month: 30 lasting >1 month Last UDS date: 12/11/13 Pain contract signed (Y/N):  yes Date narcotic database last reviewed (include red flags): 06/24/18  No change in meds at this point.  Continue prn oxycodone with gabapentin at baseline.  He is putting up with his situation and he isn't a good surgical candidate.  He'll update me as needed.  Bowel regimen d/w pt.

## 2018-07-02 ENCOUNTER — Encounter: Payer: Self-pay | Admitting: *Deleted

## 2018-07-02 ENCOUNTER — Other Ambulatory Visit: Payer: Self-pay

## 2018-07-02 NOTE — Discharge Instructions (Signed)

## 2018-07-08 ENCOUNTER — Ambulatory Visit
Admission: RE | Admit: 2018-07-08 | Discharge: 2018-07-08 | Disposition: A | Discharge status: Home / Self Care | Payer: Medicare HMO | Attending: Ophthalmology | Admitting: Ophthalmology

## 2018-07-08 ENCOUNTER — Ambulatory Visit: Payer: Medicare HMO | Admitting: Anesthesiology

## 2018-07-08 ENCOUNTER — Encounter
Admission: RE | Disposition: A | Discharge status: Home / Self Care | Payer: Self-pay | Source: Home / Self Care | Attending: Ophthalmology

## 2018-07-08 DIAGNOSIS — Z85828 Personal history of other malignant neoplasm of skin: Secondary | ICD-10-CM | POA: Diagnosis not present

## 2018-07-08 DIAGNOSIS — I2510 Atherosclerotic heart disease of native coronary artery without angina pectoris: Secondary | ICD-10-CM | POA: Diagnosis not present

## 2018-07-08 DIAGNOSIS — Z955 Presence of coronary angioplasty implant and graft: Secondary | ICD-10-CM | POA: Insufficient documentation

## 2018-07-08 DIAGNOSIS — K219 Gastro-esophageal reflux disease without esophagitis: Secondary | ICD-10-CM | POA: Insufficient documentation

## 2018-07-08 DIAGNOSIS — J449 Chronic obstructive pulmonary disease, unspecified: Secondary | ICD-10-CM | POA: Insufficient documentation

## 2018-07-08 DIAGNOSIS — H25811 Combined forms of age-related cataract, right eye: Secondary | ICD-10-CM | POA: Diagnosis not present

## 2018-07-08 DIAGNOSIS — I252 Old myocardial infarction: Secondary | ICD-10-CM | POA: Diagnosis not present

## 2018-07-08 DIAGNOSIS — Z902 Acquired absence of lung [part of]: Secondary | ICD-10-CM | POA: Insufficient documentation

## 2018-07-08 DIAGNOSIS — Z79899 Other long term (current) drug therapy: Secondary | ICD-10-CM | POA: Insufficient documentation

## 2018-07-08 DIAGNOSIS — G4730 Sleep apnea, unspecified: Secondary | ICD-10-CM | POA: Diagnosis not present

## 2018-07-08 DIAGNOSIS — I110 Hypertensive heart disease with heart failure: Secondary | ICD-10-CM | POA: Insufficient documentation

## 2018-07-08 DIAGNOSIS — I509 Heart failure, unspecified: Secondary | ICD-10-CM | POA: Insufficient documentation

## 2018-07-08 DIAGNOSIS — J8410 Pulmonary fibrosis, unspecified: Secondary | ICD-10-CM | POA: Diagnosis not present

## 2018-07-08 DIAGNOSIS — H2511 Age-related nuclear cataract, right eye: Secondary | ICD-10-CM | POA: Diagnosis not present

## 2018-07-08 DIAGNOSIS — Z9981 Dependence on supplemental oxygen: Secondary | ICD-10-CM | POA: Insufficient documentation

## 2018-07-08 DIAGNOSIS — Z7982 Long term (current) use of aspirin: Secondary | ICD-10-CM | POA: Insufficient documentation

## 2018-07-08 DIAGNOSIS — E7800 Pure hypercholesterolemia, unspecified: Secondary | ICD-10-CM | POA: Diagnosis not present

## 2018-07-08 DIAGNOSIS — Z794 Long term (current) use of insulin: Secondary | ICD-10-CM | POA: Insufficient documentation

## 2018-07-08 DIAGNOSIS — E1136 Type 2 diabetes mellitus with diabetic cataract: Secondary | ICD-10-CM | POA: Diagnosis not present

## 2018-07-08 HISTORY — PX: CATARACT EXTRACTION W/PHACO: SHX586

## 2018-07-08 LAB — GLUCOSE, CAPILLARY
Glucose-Capillary: 68 mg/dL — ABNORMAL LOW (ref 70–99)
Glucose-Capillary: 165 mg/dL — ABNORMAL HIGH (ref 70–99)

## 2018-07-08 SURGERY — PHACOEMULSIFICATION, CATARACT, WITH IOL INSERTION
Anesthesia: Monitor Anesthesia Care | Laterality: Right

## 2018-07-08 MED ORDER — ACETAMINOPHEN 160 MG/5ML PO SOLN: 325.0000 mg | ORAL | Status: DC | PRN

## 2018-07-08 MED ORDER — SODIUM HYALURONATE 10 MG/ML IO SOLN
INTRAOCULAR | Status: DC | PRN
  Administered 2018-07-08: 0.55 mL via INTRAOCULAR

## 2018-07-08 MED ORDER — LIDOCAINE HCL (PF) 2 % IJ SOLN
INTRAOCULAR | Status: DC | PRN
  Administered 2018-07-08: 1 mL via INTRAOCULAR

## 2018-07-08 MED ORDER — DEXTROSE 50 % IV SOLN
25.0000 mL | Freq: Once | INTRAVENOUS | Status: AC
  Administered 2018-07-08: 25 mL via INTRAVENOUS

## 2018-07-08 MED ORDER — MIDAZOLAM HCL 2 MG/2ML IJ SOLN
INTRAMUSCULAR | Status: DC | PRN
  Administered 2018-07-08 (×2): 1 mg via INTRAVENOUS

## 2018-07-08 MED ORDER — MOXIFLOXACIN HCL 0.5 % OP SOLN
OPHTHALMIC | Status: DC | PRN
  Administered 2018-07-08: 0.2 mL via OPHTHALMIC

## 2018-07-08 MED ORDER — ACETAMINOPHEN 325 MG PO TABS: 325.0000 mg | ORAL_TABLET | ORAL | Status: DC | PRN

## 2018-07-08 MED ORDER — EPINEPHRINE PF 1 MG/ML IJ SOLN
INTRAOCULAR | Status: DC | PRN
  Administered 2018-07-08: 79 mL via OPHTHALMIC

## 2018-07-08 MED ORDER — ARMC OPHTHALMIC DILATING DROPS
1.0000 "application " | OPHTHALMIC | Status: DC | PRN
  Administered 2018-07-08 (×3): 1 via OPHTHALMIC

## 2018-07-08 MED ORDER — LACTATED RINGERS IV SOLN: INTRAVENOUS | Status: DC

## 2018-07-08 MED ORDER — SODIUM HYALURONATE 23 MG/ML IO SOLN
INTRAOCULAR | Status: DC | PRN
  Administered 2018-07-08: 0.6 mL via INTRAOCULAR

## 2018-07-08 MED ORDER — TETRACAINE HCL 0.5 % OP SOLN
1.0000 [drp] | OPHTHALMIC | Status: DC | PRN
  Administered 2018-07-08 (×2): 1 [drp] via OPHTHALMIC

## 2018-07-08 MED ORDER — ONDANSETRON HCL 4 MG/2ML IJ SOLN: 4.0000 mg | Freq: Once | INTRAMUSCULAR | Status: DC | PRN

## 2018-07-08 SURGICAL SUPPLY — 19 items
CANNULA ANT/CHMB 27G (MISCELLANEOUS) ×2 IMPLANT
CANNULA ANT/CHMB 27GA (MISCELLANEOUS) ×4 IMPLANT
DISSECTOR HYDRO NUCLEUS 50X22 (MISCELLANEOUS) ×2 IMPLANT
GLOVE SURG LX 7.5 STRW (GLOVE) ×1
GLOVE SURG LX STRL 7.5 STRW (GLOVE) ×1 IMPLANT
GLOVE SURG SYN 8.5  E (GLOVE) ×1
GLOVE SURG SYN 8.5 E (GLOVE) ×1 IMPLANT
GLOVE SURG SYN 8.5 PF PI (GLOVE) ×1 IMPLANT
GOWN STRL REUS W/ TWL LRG LVL3 (GOWN DISPOSABLE) ×2 IMPLANT
GOWN STRL REUS W/TWL LRG LVL3 (GOWN DISPOSABLE) ×2
LENS IOL TECNIS ITEC 18.0 (Intraocular Lens) ×1 IMPLANT
MARKER SKIN DUAL TIP RULER LAB (MISCELLANEOUS) ×2 IMPLANT
PACK DR. KING ARMS (PACKS) ×2 IMPLANT
PACK EYE AFTER SURG (MISCELLANEOUS) ×2 IMPLANT
PACK OPTHALMIC (MISCELLANEOUS) ×2 IMPLANT
SYR 3ML LL SCALE MARK (SYRINGE) ×2 IMPLANT
SYR TB 1ML LUER SLIP (SYRINGE) ×2 IMPLANT
WATER STERILE IRR 500ML POUR (IV SOLUTION) ×2 IMPLANT
WIPE NON LINTING 3.25X3.25 (MISCELLANEOUS) ×2 IMPLANT

## 2018-07-08 NOTE — Op Note (Signed)
OPERATIVE NOTE  Stephen [name] [id number] 07/08/2018   PREOPERATIVE DIAGNOSIS:  Nuclear sclerotic cataract right eye.  H25.11   POSTOPERATIVE DIAGNOSIS:    Nuclear sclerotic cataract right eye.     PROCEDURE:  Phacoemusification with posterior chamber intraocular lens placement of the right eye   LENS:   Implant Name Type Inv. Item Serial No. Manufacturer Lot No. LRB No. Used  LENS IOL DIOP 18.0 - [id number] Intraocular Lens LENS IOL DIOP 18.0 [id number] AMO  Right 1       PCB00 +18.0   ULTRASOUND TIME: 0 minutes 43 seconds.  CDE 3.87   SURGEON:  Benay Pillow, MD, MPH  ANESTHESIOLOGIST: Anesthesiologist: Alisa Graff, MD CRNA: Cameron Ali, CRNA   ANESTHESIA:  Topical with tetracaine drops augmented with 1% preservative-free intracameral lidocaine.  ESTIMATED BLOOD LOSS: less than 1 mL.   COMPLICATIONS:  None.   DESCRIPTION OF PROCEDURE:  The patient was identified in the holding room and transported to the operating room and placed in the supine position under the operating microscope.  The right eye was identified as the operative eye and it was prepped and draped in the usual sterile ophthalmic fashion.   A 1.0 millimeter clear-corneal paracentesis was made at the 10:30 position. 0.5 ml of preservative-free 1% lidocaine with epinephrine was injected into the anterior chamber.  The anterior chamber was filled with Healon 5 viscoelastic.  A 2.4 millimeter keratome was used to make a near-clear corneal incision at the 8:00 position.  A curvilinear capsulorrhexis was made with a cystotome and capsulorrhexis forceps.  Balanced salt solution was used to hydrodissect and hydrodelineate the nucleus.   Phacoemulsification was then used in stop and chop fashion to remove the lens nucleus and epinucleus.  The remaining cortex was then removed using the irrigation and aspiration handpiece. Healon was then placed into the capsular bag to distend it for lens placement.  A lens was then  injected into the capsular bag.  The remaining viscoelastic was aspirated.   Wounds were hydrated with balanced salt solution.  The anterior chamber was inflated to a physiologic pressure with balanced salt solution.   Intracameral vigamox 0.1 mL undiluted was injected into the eye and a drop placed onto the ocular surface.  No wound leaks were noted.  The patient was taken to the recovery room in stable condition without complications of anesthesia or surgery  Benay Pillow 07/08/2018, 7:50 AM

## 2018-07-08 NOTE — H&P (Signed)

## 2018-07-08 NOTE — Anesthesia Procedure Notes (Signed)
Procedure Name: MAC Date/Time: 07/08/2018 7:30 AM Performed by: Cameron Ali, CRNA Pre-anesthesia Checklist: Patient identified, Emergency Drugs available, Suction available, Timeout performed and Patient being monitored Patient Re-evaluated:Patient Re-evaluated prior to induction Oxygen Delivery Method: Nasal cannula Placement Confirmation: positive ETCO2

## 2018-07-08 NOTE — Anesthesia Postprocedure Evaluation (Signed)
Anesthesia Post Note  Patient: Stephen [name]  Procedure(s) Performed: CATARACT EXTRACTION PHACO AND INTRAOCULAR LENS PLACEMENT (IOC) RIGHT DIABETIC (Right )  Patient location during evaluation: PACU Anesthesia Type: MAC Level of consciousness: awake and alert Pain management: pain level controlled Vital Signs Assessment: post-procedure vital signs reviewed and stable Respiratory status: spontaneous breathing, nonlabored ventilation, respiratory function stable and patient connected to nasal cannula oxygen Cardiovascular status: stable and blood pressure returned to baseline Postop Assessment: no apparent nausea or vomiting Anesthetic complications: no    Alisa Graff

## 2018-07-08 NOTE — Transfer of Care (Signed)
Immediate Anesthesia Transfer of Care Note  Patient: Stephen [name]  Procedure(s) Performed: CATARACT EXTRACTION PHACO AND INTRAOCULAR LENS PLACEMENT (IOC) RIGHT DIABETIC (Right )  Patient Location: PACU  Anesthesia Type: MAC  Level of Consciousness: awake, alert  and patient cooperative  Airway and Oxygen Therapy: Patient Spontanous Breathing and Patient connected to supplemental oxygen  Post-op Assessment: Post-op Vital signs reviewed, Patient's Cardiovascular Status Stable, Respiratory Function Stable, Patent Airway and No signs of Nausea or vomiting  Post-op Vital Signs: Reviewed and stable  Complications: No apparent anesthesia complications

## 2018-07-08 NOTE — Anesthesia Preprocedure Evaluation (Addendum)
Anesthesia Evaluation  Patient identified by MRN, date of birth, ID band Patient awake    Reviewed: Allergy & Precautions, H&P , NPO status , Patient's Chart, lab work & pertinent test results, reviewed documented beta blocker date and time   Airway Mallampati: II  TM Distance: >3 FB Neck ROM: full    Dental no notable dental hx.    Pulmonary sleep apnea ,  Pulmonary fibrosis   Pulmonary exam normal breath sounds clear to auscultation       Cardiovascular Exercise Tolerance: Good hypertension, + CAD, + Past MI and +CHF   Rhythm:regular Rate:Normal     Neuro/Psych negative neurological ROS  negative psych ROS   GI/Hepatic Neg liver ROS, GERD  ,  Endo/Other  diabetes, Type 2  Renal/GU CRFRenal disease (stage 3)  negative genitourinary   Musculoskeletal  (+) Arthritis ,   Abdominal   Peds  Hematology negative hematology ROS (+)   Anesthesia Other Findings   Reproductive/Obstetrics negative OB ROS                             Anesthesia Physical  Anesthesia Plan  ASA: III  Anesthesia Plan: MAC   Post-op Pain Management:    Induction:   PONV Risk Score and Plan:   Airway Management Planned:   Additional Equipment:   Intra-op Plan:   Post-operative Plan:   Informed Consent: I have reviewed the patients History and Physical, chart, labs and discussed the procedure including the risks, benefits and alternatives for the proposed anesthesia with the patient or authorized representative who has indicated his/her understanding and acceptance.     Dental Advisory Given  Plan Discussed with: CRNA  Anesthesia Plan Comments:         Anesthesia Quick Evaluation  

## 2018-07-09 ENCOUNTER — Encounter: Payer: Self-pay | Admitting: Ophthalmology

## 2018-07-13 ENCOUNTER — Other Ambulatory Visit: Payer: Self-pay | Admitting: Family Medicine

## 2018-07-18 DIAGNOSIS — H2512 Age-related nuclear cataract, left eye: Secondary | ICD-10-CM | POA: Diagnosis not present

## 2018-07-25 DIAGNOSIS — J982 Interstitial emphysema: Secondary | ICD-10-CM | POA: Diagnosis not present

## 2018-07-30 NOTE — Anesthesia Preprocedure Evaluation (Deleted)
Anesthesia Evaluation  Patient identified by MRN, date of birth, ID band Patient awake    Reviewed: Allergy & Precautions, H&P , NPO status , Patient's Chart, lab work & pertinent test results, reviewed documented beta blocker date and time   Airway Mallampati: II  TM Distance: >3 FB Neck ROM: full    Dental no notable dental hx.    Pulmonary sleep apnea ,  Pulmonary fibrosis   Pulmonary exam normal breath sounds clear to auscultation       Cardiovascular Exercise Tolerance: Good hypertension, + CAD, + Past MI and +CHF   Rhythm:regular Rate:Normal     Neuro/Psych negative neurological ROS  negative psych ROS   GI/Hepatic Neg liver ROS, GERD  ,  Endo/Other  diabetes, Type 2  Renal/GU CRFRenal disease (stage 3)  negative genitourinary   Musculoskeletal  (+) Arthritis ,   Abdominal   Peds  Hematology negative hematology ROS (+)   Anesthesia Other Findings   Reproductive/Obstetrics negative OB ROS         Anesthesia Plan  ASA: III  Anesthesia Plan: MAC   Post-op Pain Management:    Induction:   PONV Risk Score and Plan:   Airway Management Planned:   Additional Equipment:   Intra-op Plan:   Post-operative Plan:   Informed Consent: I have reviewed the patients History and Physical, chart, labs and discussed the procedure including the risks, benefits and alternatives for the proposed anesthesia with the patient or authorized representative who has indicated his/her understanding and acceptance.   Dental Advisory Given  Plan Discussed with: CRNA  Anesthesia Plan Comments:

## 2018-08-01 NOTE — Discharge Instructions (Signed)

## 2018-08-05 ENCOUNTER — Ambulatory Visit
Admission: RE | Admit: 2018-08-05 | Discharge: 2018-08-05 | Disposition: A | Discharge status: Home / Self Care | Payer: Medicare HMO | Attending: Ophthalmology | Admitting: Ophthalmology

## 2018-08-05 ENCOUNTER — Ambulatory Visit: Payer: Medicare HMO | Admitting: Anesthesiology

## 2018-08-05 ENCOUNTER — Encounter
Admission: RE | Disposition: A | Discharge status: Home / Self Care | Payer: Self-pay | Source: Home / Self Care | Attending: Ophthalmology

## 2018-08-05 DIAGNOSIS — I2510 Atherosclerotic heart disease of native coronary artery without angina pectoris: Secondary | ICD-10-CM | POA: Diagnosis not present

## 2018-08-05 DIAGNOSIS — E1136 Type 2 diabetes mellitus with diabetic cataract: Secondary | ICD-10-CM | POA: Insufficient documentation

## 2018-08-05 DIAGNOSIS — I252 Old myocardial infarction: Secondary | ICD-10-CM | POA: Diagnosis not present

## 2018-08-05 DIAGNOSIS — I509 Heart failure, unspecified: Secondary | ICD-10-CM | POA: Insufficient documentation

## 2018-08-05 DIAGNOSIS — Z955 Presence of coronary angioplasty implant and graft: Secondary | ICD-10-CM | POA: Diagnosis not present

## 2018-08-05 DIAGNOSIS — Z794 Long term (current) use of insulin: Secondary | ICD-10-CM | POA: Diagnosis not present

## 2018-08-05 DIAGNOSIS — J449 Chronic obstructive pulmonary disease, unspecified: Secondary | ICD-10-CM | POA: Insufficient documentation

## 2018-08-05 DIAGNOSIS — Z7982 Long term (current) use of aspirin: Secondary | ICD-10-CM | POA: Insufficient documentation

## 2018-08-05 DIAGNOSIS — Z79899 Other long term (current) drug therapy: Secondary | ICD-10-CM | POA: Insufficient documentation

## 2018-08-05 DIAGNOSIS — K219 Gastro-esophageal reflux disease without esophagitis: Secondary | ICD-10-CM | POA: Insufficient documentation

## 2018-08-05 DIAGNOSIS — H2512 Age-related nuclear cataract, left eye: Secondary | ICD-10-CM | POA: Insufficient documentation

## 2018-08-05 DIAGNOSIS — G4730 Sleep apnea, unspecified: Secondary | ICD-10-CM | POA: Diagnosis not present

## 2018-08-05 DIAGNOSIS — I130 Hypertensive heart and chronic kidney disease with heart failure and stage 1 through stage 4 chronic kidney disease, or unspecified chronic kidney disease: Secondary | ICD-10-CM | POA: Diagnosis not present

## 2018-08-05 DIAGNOSIS — H25812 Combined forms of age-related cataract, left eye: Secondary | ICD-10-CM | POA: Diagnosis not present

## 2018-08-05 DIAGNOSIS — E1122 Type 2 diabetes mellitus with diabetic chronic kidney disease: Secondary | ICD-10-CM | POA: Insufficient documentation

## 2018-08-05 DIAGNOSIS — N189 Chronic kidney disease, unspecified: Secondary | ICD-10-CM | POA: Diagnosis not present

## 2018-08-05 DIAGNOSIS — E7800 Pure hypercholesterolemia, unspecified: Secondary | ICD-10-CM | POA: Insufficient documentation

## 2018-08-05 DIAGNOSIS — Z7902 Long term (current) use of antithrombotics/antiplatelets: Secondary | ICD-10-CM | POA: Diagnosis not present

## 2018-08-05 HISTORY — PX: CATARACT EXTRACTION W/PHACO: SHX586

## 2018-08-05 LAB — GLUCOSE, CAPILLARY
Glucose-Capillary: 55 mg/dL — ABNORMAL LOW (ref 70–99)
Glucose-Capillary: 79 mg/dL (ref 70–99)
Glucose-Capillary: 111 mg/dL — ABNORMAL HIGH (ref 70–99)
Glucose-Capillary: 48 mg/dL — ABNORMAL LOW (ref 70–99)

## 2018-08-05 SURGERY — PHACOEMULSIFICATION, CATARACT, WITH IOL INSERTION
Anesthesia: Monitor Anesthesia Care | Site: Eye | Laterality: Left

## 2018-08-05 MED ORDER — ARMC OPHTHALMIC DILATING DROPS
1.0000 "application " | OPHTHALMIC | Status: DC | PRN
  Administered 2018-08-05 (×3): 1 via OPHTHALMIC

## 2018-08-05 MED ORDER — MOXIFLOXACIN HCL 0.5 % OP SOLN
OPHTHALMIC | Status: DC | PRN
  Administered 2018-08-05: 0.2 mL via OPHTHALMIC

## 2018-08-05 MED ORDER — TETRACAINE HCL 0.5 % OP SOLN
1.0000 [drp] | OPHTHALMIC | Status: DC | PRN
  Administered 2018-08-05 (×2): 1 [drp] via OPHTHALMIC

## 2018-08-05 MED ORDER — ACETAMINOPHEN 160 MG/5ML PO SOLN: 325.0000 mg | ORAL | Status: DC | PRN

## 2018-08-05 MED ORDER — DEXTROSE 50 % IV SOLN
25.0000 mL | Freq: Once | INTRAVENOUS | Status: AC
  Administered 2018-08-05: 25 mL via INTRAVENOUS

## 2018-08-05 MED ORDER — MIDAZOLAM HCL 2 MG/2ML IJ SOLN
INTRAMUSCULAR | Status: DC | PRN
  Administered 2018-08-05 (×2): 1 mg via INTRAVENOUS

## 2018-08-05 MED ORDER — EPINEPHRINE PF 1 MG/ML IJ SOLN
INTRAOCULAR | Status: DC | PRN
  Administered 2018-08-05: 75 mL via OPHTHALMIC

## 2018-08-05 MED ORDER — SODIUM HYALURONATE 10 MG/ML IO SOLN
INTRAOCULAR | Status: DC | PRN
  Administered 2018-08-05: 0.55 mL via INTRAOCULAR

## 2018-08-05 MED ORDER — ACETAMINOPHEN 325 MG PO TABS: 325.0000 mg | ORAL_TABLET | ORAL | Status: DC | PRN

## 2018-08-05 MED ORDER — LACTATED RINGERS IV SOLN
INTRAVENOUS | Status: DC
  Administered 2018-08-05: 07:00:00 via INTRAVENOUS

## 2018-08-05 MED ORDER — DEXTROSE 50 % IV SOLN: 12.5000 mL | Freq: Once | INTRAVENOUS | Status: DC

## 2018-08-05 MED ORDER — LIDOCAINE HCL (PF) 2 % IJ SOLN
INTRAOCULAR | Status: DC | PRN
  Administered 2018-08-05: 1 mL via INTRAOCULAR

## 2018-08-05 MED ORDER — SODIUM HYALURONATE 23 MG/ML IO SOLN
INTRAOCULAR | Status: DC | PRN
  Administered 2018-08-05: 0.6 mL via INTRAOCULAR

## 2018-08-05 SURGICAL SUPPLY — 17 items
CANNULA ANT/CHMB 27GA (MISCELLANEOUS) ×4 IMPLANT
DISSECTOR HYDRO NUCLEUS 50X22 (MISCELLANEOUS) ×2 IMPLANT
GLOVE SURG LX 7.5 STRW (GLOVE) ×1
GLOVE SURG LX STRL 7.5 STRW (GLOVE) ×1 IMPLANT
GLOVE SURG SYN 8.5  E (GLOVE) ×1
GLOVE SURG SYN 8.5 E (GLOVE) ×1 IMPLANT
GOWN STRL REUS W/ TWL LRG LVL3 (GOWN DISPOSABLE) ×2 IMPLANT
GOWN STRL REUS W/TWL LRG LVL3 (GOWN DISPOSABLE) ×2
LENS IOL TECNIS ITEC 18.0 (Intraocular Lens) ×2 IMPLANT
MARKER SKIN DUAL TIP RULER LAB (MISCELLANEOUS) ×2 IMPLANT
PACK DR. KING ARMS (PACKS) ×2 IMPLANT
PACK EYE AFTER SURG (MISCELLANEOUS) ×2 IMPLANT
PACK OPTHALMIC (MISCELLANEOUS) ×2 IMPLANT
SYR 3ML LL SCALE MARK (SYRINGE) ×2 IMPLANT
SYR TB 1ML LUER SLIP (SYRINGE) ×2 IMPLANT
WATER STERILE IRR 500ML POUR (IV SOLUTION) ×2 IMPLANT
WIPE NON LINTING 3.25X3.25 (MISCELLANEOUS) ×2 IMPLANT

## 2018-08-05 NOTE — H&P (Signed)

## 2018-08-05 NOTE — Anesthesia Postprocedure Evaluation (Signed)
Anesthesia Post Note  Patient: Stephen [name]  Procedure(s) Performed: CATARACT EXTRACTION PHACO AND INTRAOCULAR LENS PLACEMENT (IOC) LEFT DIABETIC (Left Eye)  Patient location during evaluation: PACU Anesthesia Type: MAC Level of consciousness: awake and alert Pain management: pain level controlled Vital Signs Assessment: post-procedure vital signs reviewed and stable Respiratory status: spontaneous breathing, nonlabored ventilation, respiratory function stable and patient connected to nasal cannula oxygen Cardiovascular status: stable and blood pressure returned to baseline Postop Assessment: no apparent nausea or vomiting Anesthetic complications: no    Alisa Graff

## 2018-08-05 NOTE — Anesthesia Preprocedure Evaluation (Signed)
Anesthesia Evaluation  Patient identified by MRN, date of birth, ID band Patient awake    Reviewed: Allergy & Precautions, H&P , NPO status , Patient's Chart, lab work & pertinent test results, reviewed documented beta blocker date and time   Airway Mallampati: II  TM Distance: >3 FB Neck ROM: full    Dental no notable dental hx.    Pulmonary sleep apnea ,  Pulmonary fibrosis   Pulmonary exam normal breath sounds clear to auscultation       Cardiovascular Exercise Tolerance: Good hypertension, + CAD, + Past MI and +CHF   Rhythm:regular Rate:Normal     Neuro/Psych negative neurological ROS  negative psych ROS   GI/Hepatic Neg liver ROS, GERD  ,  Endo/Other  diabetes, Type 2  Renal/GU CRFRenal disease (stage 3)  negative genitourinary   Musculoskeletal  (+) Arthritis ,   Abdominal   Peds  Hematology negative hematology ROS (+)   Anesthesia Other Findings   Reproductive/Obstetrics negative OB ROS                             Anesthesia Physical  Anesthesia Plan  ASA: III  Anesthesia Plan: MAC   Post-op Pain Management:    Induction:   PONV Risk Score and Plan:   Airway Management Planned:   Additional Equipment:   Intra-op Plan:   Post-operative Plan:   Informed Consent: I have reviewed the patients History and Physical, chart, labs and discussed the procedure including the risks, benefits and alternatives for the proposed anesthesia with the patient or authorized representative who has indicated his/her understanding and acceptance.     Dental Advisory Given  Plan Discussed with: CRNA  Anesthesia Plan Comments:         Anesthesia Quick Evaluation

## 2018-08-05 NOTE — Op Note (Signed)
OPERATIVE NOTE  ZAMARIAN SCARANO [id number] 08/05/2018   PREOPERATIVE DIAGNOSIS:  Nuclear sclerotic cataract left eye.  H25.12   POSTOPERATIVE DIAGNOSIS:    Nuclear sclerotic cataract left eye.     PROCEDURE:  Phacoemusification with posterior chamber intraocular lens placement of the left eye   LENS:   Implant Name Type Inv. Item Serial No. Manufacturer Lot No. LRB No. Used  LENS IOL DIOP 18.0 - [id number] Intraocular Lens LENS IOL DIOP 18.0 [id number] AMO  Left 1  LENS IOL DIOP 18.0 - [id number] Intraocular Lens LENS IOL DIOP 18.0 [id number] AMO  Left 1       PCB00 +18.0   ULTRASOUND TIME: 0 minutes 34 seconds.  CDE 3.68   SURGEON:  Benay Pillow, MD, MPH   ANESTHESIA:  Topical with tetracaine drops augmented with 1% preservative-free intracameral lidocaine.  ESTIMATED BLOOD LOSS: <1 mL   COMPLICATIONS:  None.   DESCRIPTION OF PROCEDURE:  The patient was identified in the holding room and transported to the operating room and placed in the supine position under the operating microscope.  The left eye was identified as the operative eye and it was prepped and draped in the usual sterile ophthalmic fashion.   A 1.0 millimeter clear-corneal paracentesis was made at the 5:00 position. 0.5 ml of preservative-free 1% lidocaine with epinephrine was injected into the anterior chamber.  The anterior chamber was filled with Healon 5 viscoelastic.  A 2.4 millimeter keratome was used to make a near-clear corneal incision at the 2:00 position.  A curvilinear capsulorrhexis was made with a cystotome and capsulorrhexis forceps.  Balanced salt solution was used to hydrodissect and hydrodelineate the nucleus.   Phacoemulsification was then used in stop and chop fashion to remove the lens nucleus and epinucleus.  The remaining cortex was then removed using the irrigation and aspiration handpiece. Healon was then placed into the capsular bag to distend it for lens placement.  A lens was then  injected into the capsular bag.  The remaining viscoelastic was aspirated.   Wounds were hydrated with balanced salt solution.  The anterior chamber was inflated to a physiologic pressure with balanced salt solution.  Intracameral vigamox 0.1 mL undiltued was injected into the eye and a drop placed onto the ocular surface.  No wound leaks were noted.  The patient was taken to the recovery room in stable condition without complications of anesthesia or surgery  Benay Pillow 08/05/2018, 8:40 AM

## 2018-08-05 NOTE — Transfer of Care (Signed)
Immediate Anesthesia Transfer of Care Note  Patient: Stephen [name]  Procedure(s) Performed: CATARACT EXTRACTION PHACO AND INTRAOCULAR LENS PLACEMENT (IOC) LEFT DIABETIC (Left Eye)  Patient Location: PACU  Anesthesia Type: No value filed.  Level of Consciousness: awake, alert  and patient cooperative  Airway and Oxygen Therapy: Patient Spontanous Breathing and Patient connected to supplemental oxygen  Post-op Assessment: Post-op Vital signs reviewed, Patient's Cardiovascular Status Stable, Respiratory Function Stable, Patent Airway and No signs of Nausea or vomiting  Post-op Vital Signs: Reviewed and stable  Complications: No apparent anesthesia complications

## 2018-08-05 NOTE — Progress Notes (Signed)
Patient's blood sugar level was 48 at 0846, Dr. Earl Gala notified. Dextrose was given, patient's blood level was 111 at 0908, and Dr. Earl Gala notified.

## 2018-08-05 NOTE — Anesthesia Procedure Notes (Signed)
Procedure Name: MAC Date/Time: 08/05/2018 8:20 AM Performed by: Cameron Ali, CRNA Pre-anesthesia Checklist: Patient identified, Emergency Drugs available, Suction available, Timeout performed and Patient being monitored Patient Re-evaluated:Patient Re-evaluated prior to induction Oxygen Delivery Method: Nasal cannula Placement Confirmation: positive ETCO2

## 2018-08-06 ENCOUNTER — Encounter: Payer: Self-pay | Admitting: Ophthalmology

## 2018-08-08 DIAGNOSIS — J449 Chronic obstructive pulmonary disease, unspecified: Secondary | ICD-10-CM | POA: Diagnosis not present

## 2018-08-08 DIAGNOSIS — E113213 Type 2 diabetes mellitus with mild nonproliferative diabetic retinopathy with macular edema, bilateral: Secondary | ICD-10-CM | POA: Diagnosis not present

## 2018-08-14 ENCOUNTER — Ambulatory Visit
Admission: EM | Admit: 2018-08-14 | Discharge: 2018-08-14 | Disposition: A | Discharge status: Home / Self Care | Payer: Medicare HMO | Source: Home / Self Care | Attending: Family Medicine | Admitting: Family Medicine

## 2018-08-14 ENCOUNTER — Emergency Department
Admission: EM | Admit: 2018-08-14 | Discharge: 2018-08-14 | Disposition: A | Discharge status: Home / Self Care | Payer: Medicare HMO | Attending: Emergency Medicine | Admitting: Emergency Medicine

## 2018-08-14 ENCOUNTER — Emergency Department: Payer: Medicare HMO

## 2018-08-14 ENCOUNTER — Encounter: Payer: Self-pay | Admitting: Emergency Medicine

## 2018-08-14 ENCOUNTER — Other Ambulatory Visit: Payer: Self-pay

## 2018-08-14 ENCOUNTER — Telehealth: Payer: Self-pay

## 2018-08-14 DIAGNOSIS — R0602 Shortness of breath: Secondary | ICD-10-CM | POA: Insufficient documentation

## 2018-08-14 DIAGNOSIS — I5032 Chronic diastolic (congestive) heart failure: Secondary | ICD-10-CM | POA: Insufficient documentation

## 2018-08-14 DIAGNOSIS — I25119 Atherosclerotic heart disease of native coronary artery with unspecified angina pectoris: Secondary | ICD-10-CM | POA: Diagnosis not present

## 2018-08-14 DIAGNOSIS — I10 Essential (primary) hypertension: Secondary | ICD-10-CM | POA: Diagnosis not present

## 2018-08-14 DIAGNOSIS — N183 Chronic kidney disease, stage 3 (moderate): Secondary | ICD-10-CM | POA: Insufficient documentation

## 2018-08-14 DIAGNOSIS — I130 Hypertensive heart and chronic kidney disease with heart failure and stage 1 through stage 4 chronic kidney disease, or unspecified chronic kidney disease: Secondary | ICD-10-CM | POA: Diagnosis not present

## 2018-08-14 DIAGNOSIS — Z79899 Other long term (current) drug therapy: Secondary | ICD-10-CM | POA: Diagnosis not present

## 2018-08-14 DIAGNOSIS — I219 Acute myocardial infarction, unspecified: Secondary | ICD-10-CM | POA: Diagnosis not present

## 2018-08-14 DIAGNOSIS — J069 Acute upper respiratory infection, unspecified: Secondary | ICD-10-CM | POA: Insufficient documentation

## 2018-08-14 DIAGNOSIS — N189 Chronic kidney disease, unspecified: Secondary | ICD-10-CM | POA: Diagnosis not present

## 2018-08-14 DIAGNOSIS — E1122 Type 2 diabetes mellitus with diabetic chronic kidney disease: Secondary | ICD-10-CM | POA: Diagnosis not present

## 2018-08-14 DIAGNOSIS — R062 Wheezing: Secondary | ICD-10-CM

## 2018-08-14 DIAGNOSIS — J209 Acute bronchitis, unspecified: Secondary | ICD-10-CM

## 2018-08-14 DIAGNOSIS — J40 Bronchitis, not specified as acute or chronic: Secondary | ICD-10-CM | POA: Diagnosis not present

## 2018-08-14 DIAGNOSIS — R079 Chest pain, unspecified: Secondary | ICD-10-CM | POA: Insufficient documentation

## 2018-08-14 DIAGNOSIS — Z794 Long term (current) use of insulin: Secondary | ICD-10-CM | POA: Diagnosis not present

## 2018-08-14 DIAGNOSIS — Z7982 Long term (current) use of aspirin: Secondary | ICD-10-CM | POA: Insufficient documentation

## 2018-08-14 DIAGNOSIS — E1140 Type 2 diabetes mellitus with diabetic neuropathy, unspecified: Secondary | ICD-10-CM

## 2018-08-14 DIAGNOSIS — R05 Cough: Secondary | ICD-10-CM | POA: Diagnosis not present

## 2018-08-14 LAB — CBC
HCT: 44 % (ref 39.0–52.0)
Hemoglobin: 14.4 g/dL (ref 13.0–17.0)
MCH: 28.3 pg (ref 26.0–34.0)
MCHC: 32.7 g/dL (ref 30.0–36.0)
MCV: 86.4 fL (ref 80.0–100.0)
Platelets: 184 10*3/uL (ref 150–400)
RBC: 5.09 MIL/uL (ref 4.22–5.81)
RDW: 14.1 % (ref 11.5–15.5)
WBC: 8.4 10*3/uL (ref 4.0–10.5)
nRBC: 0 % (ref 0.0–0.2)

## 2018-08-14 LAB — BASIC METABOLIC PANEL
Anion gap: 5 (ref 5–15)
BUN: 26 mg/dL — ABNORMAL HIGH (ref 8–23)
CO2: 26 mmol/L (ref 22–32)
Calcium: 8.5 mg/dL — ABNORMAL LOW (ref 8.9–10.3)
Chloride: 110 mmol/L (ref 98–111)
Creatinine, Ser: 1.96 mg/dL — ABNORMAL HIGH (ref 0.61–1.24)
GFR calc Af Amer: 39 mL/min — ABNORMAL LOW (ref >60)
GFR calc non Af Amer: 34 mL/min — ABNORMAL LOW (ref >60)
Glucose, Bld: 64 mg/dL — ABNORMAL LOW (ref 70–99)
Potassium: 3.6 mmol/L (ref 3.5–5.1)
Sodium: 141 mmol/L (ref 135–145)

## 2018-08-14 LAB — LIPASE, BLOOD: Lipase: 29 U/L (ref 11–51)

## 2018-08-14 LAB — TROPONIN I
Troponin I: 0.04 ng/mL (ref <0.03)
Troponin I: 0.03 ng/mL (ref <0.03)

## 2018-08-14 LAB — GLUCOSE, CAPILLARY: Glucose-Capillary: 71 mg/dL (ref 70–99)

## 2018-08-14 MED ORDER — PREDNISONE 20 MG PO TABS
60.0000 mg | ORAL_TABLET | Freq: Once | ORAL | Status: AC
  Administered 2018-08-14: 60 mg via ORAL
  Filled 2018-08-14: qty 3

## 2018-08-14 MED ORDER — AZITHROMYCIN 500 MG PO TABS
500.0000 mg | ORAL_TABLET | Freq: Once | ORAL | Status: AC
  Administered 2018-08-14: 500 mg via ORAL
  Filled 2018-08-14: qty 1

## 2018-08-14 MED ORDER — IPRATROPIUM-ALBUTEROL 0.5-2.5 (3) MG/3ML IN SOLN
9.0000 mL | Freq: Once | RESPIRATORY_TRACT | Status: AC
  Administered 2018-08-14: 9 mL via RESPIRATORY_TRACT
  Filled 2018-08-14: qty 9

## 2018-08-14 MED ORDER — PREDNISONE 50 MG PO TABS: ORAL_TABLET | ORAL | 0 refills | Status: DC

## 2018-08-14 MED ORDER — AZITHROMYCIN 250 MG PO TABS: ORAL_TABLET | ORAL | 0 refills | Status: AC

## 2018-08-14 MED ORDER — ALBUTEROL SULFATE (2.5 MG/3ML) 0.083% IN NEBU
2.5000 mg | INHALATION_SOLUTION | Freq: Once | RESPIRATORY_TRACT | Status: AC
  Administered 2018-08-14: 2.5 mg via RESPIRATORY_TRACT

## 2018-08-14 MED ORDER — ALBUTEROL SULFATE HFA 108 (90 BASE) MCG/ACT IN AERS: 2.0000 | INHALATION_SPRAY | Freq: Four times a day (QID) | RESPIRATORY_TRACT | 2 refills | Status: DC | PRN

## 2018-08-14 NOTE — ED Notes (Signed)
Patient blood sugar rechecked. Patient's wife she said she gave him couple of cookies in the waiting room since he had not eaten lunch. Repeat CBG 71. MD aware. Patient given sandwich tray and drink.

## 2018-08-14 NOTE — Discharge Instructions (Addendum)
Go directly to the emergency room as discussed.  °

## 2018-08-14 NOTE — ED Triage Notes (Signed)
Pt to ED after being seen at [facility] urgent care today c/o cough and congestion, wheezing, and SOB.  States pain to chest while coughing.  Pt was given a couple breathing treatments at [facility].  Pt states had some relief but not much after treatments.  A&Ox4, speaking in complete and coherent sentences, lungs clear throughout, in NAD at this time.

## 2018-08-14 NOTE — Telephone Encounter (Signed)
Noted. Agreed.  Please try to get update on patient tomorrow.

## 2018-08-14 NOTE — Telephone Encounter (Signed)
Pt said that for 3 - 4 days pt has had cold symptoms with fever. Now pt said "has settled into his chest". No fever now but difficulty breathing with wheezing. Breathing is very tight. Pt has CP on lt side with coughing episode.Pt is going to UC now. FYI to Dr [name].

## 2018-08-14 NOTE — ED Notes (Signed)
Patient transported to X-ray 

## 2018-08-14 NOTE — ED Triage Notes (Signed)
Pt c/o cough, nasal congestion, headache, SOB, and chest pain from coughing. Started about 3 days ago.

## 2018-08-14 NOTE — ED Provider Notes (Signed)
[facility] Emergency Department Provider Note  ____________________________________________   First MD Initiated Contact with Patient 08/14/18 1547     (approximate)  I have reviewed the triage vital signs and the nursing notes.   HISTORY  Chief Complaint URI and Shortness of Breath   HPI Stephen [name] is a 73 y.o. male with a history of CAD, CKD, MI, pulmonary fibrosis on home O2 has presented emergency department 3 days of cough productive of clear sputum, runny nose as well as shortness of breath.  He states that also while coughing last night he was having cramping chest pain across the front of his chest which is now resolved.  He went to urgent care this morning and sent to the emergency department because of generalized wheezing and comorbidities.  Patient received 2 albuterol nebulizations there with mild improvement.  Denies any body aches.  Says that he had a "low-grade fever" but states that the max temperature was 99.4 at home.   Past Medical History:  Diagnosis Date  . Adrenal gland anomaly   . Arthritis   . CAD (coronary artery disease)    a. 04/2006 MI and PCI/stenting to mLCx & mRCA; b. 07/2009 Cath: patent LCX/RCA stents;  c. 12/2016 NSTEMI/Cath: LM 30, LAD 30p, 42m 30d, D1/2/3 min irregs, LCX 1062mSR, OM1 min irregs, RCA 20p, 10 ISR, 7037m0d, RPDA min irregs, RPAV 40-->Med Rx.  . CKD (chronic kidney disease), stage III ([place]CC[place] . Colon polyps   . Diabetes mellitus with neuropathy (HCC[place] . DM2 (diabetes mellitus, type 2) (HCC)    insulin requiring  . Dyslipidemia   . GERD (gastroesophageal reflux disease)   . History of echocardiogram    a. TTE 12/2016: EF 50-55%, mod concentric LVH, images inadequate for wall motion assessment, not technically sufficienct to allow for LV dias fxn, calcified mitral annulus   . HTN (hypertension)   . Kidney stones   . Left Renal artery stenosis (HCCCrow[place]  a. 07/2013 s/p PTA/stenting ([place]).  .  Myocardial infarction (HCCWells . OSA (obstructive sleep apnea)   . Pulmonary fibrosis (HCCNorth Lind[place]  a. 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  . Recurrent UTI   . Rotator cuff disorder    right  . Skin cancer    head  . Thyroid disorder   . Venous insufficiency of both lower extremities    a. s/p laser treatment.    Patient Active Problem List   Diagnosis Date Noted  . Knee pain 03/26/2018  . Contusion of foot 02/15/2018  . Encounter for chronic pain management 12/23/2017  . Healthcare maintenance 09/21/2017  . Skin lesion 06/15/2017  . Acute on chronic renal failure (HCCWhitaker7/16/2018  . Coronary stent occlusion: Occluded circumflex stent 01/13/2017  . Accelerated hypertension with diastolic congestive heart failure, NYHA class 3 (HCCKapaaelevated LVEDP on chronic catheterization. 01/13/2017  . NSTEMI (non-ST elevated myocardial infarction) (HCCKeenesburg7/06/2017  . Blood in urine 12/08/2016  . Lymphedema 10/23/2016  . Low back pain with right-sided sciatica 04/03/2014  . Muscle spasm 04/24/2013  . Chronic edema 11/13/2012  . Leg edema, right 09/29/2012  . SK (seborrheic keratosis) 09/29/2012  . Chronic diastolic CHF (congestive heart failure) (HCCClint6/14/2013  . Sebaceous cyst 11/28/2011  . Morbid obesity (HCCCoburg1/24/2013  . Hyperlipidemia with target low density lipoprotein (LDL) cholesterol less than 70 mg/dL 04/26/2011  . Adrenal adenoma 03/02/2011  . Back pain 12/06/2010  .  Kidney stones 12/06/2010  . Essential hypertension, benign 10/28/2010  . PULMONARY FIBROSIS 09/15/2010  . Coronary artery disease involving native coronary artery of native heart with angina pectoris ([place]) 09/01/2010  . Type 2 diabetes mellitus with renal complication ([place]) 53/64/6803  . METABOLIC SYNDROME X 21/22/4825  . SLEEP APNEA, OBSTRUCTIVE 08/31/2010  . Chronic kidney disease, stage III (moderate) ([place]) 08/31/2010    Past Surgical History:  Procedure  Laterality Date  . adrenal adenoma removal  1990-   right  . CARPAL TUNNEL RELEASE     bilateral  . CATARACT EXTRACTION W/PHACO Right 07/08/2018   Procedure: CATARACT EXTRACTION PHACO AND INTRAOCULAR LENS PLACEMENT (IOC) RIGHT DIABETIC;  Surgeon: Eulogio Bear, MD;  Location: [place];  Service: Ophthalmology;  Laterality: Right;  Diabetic - insulin sleep apnea  . CATARACT EXTRACTION W/PHACO Left 08/05/2018   Procedure: CATARACT EXTRACTION PHACO AND INTRAOCULAR LENS PLACEMENT ([place]) LEFT DIABETIC;  Surgeon: Eulogio Bear, MD;  Location: [facility];  Service: Ophthalmology;  Laterality: Left;  DIABETIC  . CORONARY ANGIOPLASTY WITH STENT PLACEMENT  2007   x 2  . KNEE SURGERY     right  . LEFT HEART CATH AND CORONARY ANGIOGRAPHY N/A 01/11/2017   Procedure: Left Heart Cath and Coronary Angiography;  Surgeon: Wellington Hampshire, MD;  Location: [facility];  Service: Cardiovascular;  Laterality: N/A;  . LUNG BIOPSY    . RENAL ARTERY STENT  2015   L  . SKIN CANCER EXCISION     back of head    Prior to Admission medications   Medication Sig Start Date End Date Taking? Authorizing Provider  ACCU-CHEK SOFTCLIX LANCETS lancets Use as instructed to check blood sugar three times daily and as needed.  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 05/16/17  Yes [name], MD  Alcohol Swabs (B-D SINGLE USE SWABS REGULAR) PADS Use to cleans area prior to checking blood sugar three times daily and as needed.  Diagnosis:  E11.22  Insulin dependent. 05/16/17  Yes [name], MD  amLODipine (NORVASC) 5 MG tablet TAKE 1 TABLET (5 MG TOTAL) BY MOUTH DAILY. 12/28/17  Yes [name], MD  aspirin 81 MG tablet Take 324 mg by mouth daily.    Yes [provider]  atorvastatin (LIPITOR) 80 MG tablet TAKE 1 TABLET AT BEDTIME 07/15/18  Yes [name], MD  Blood Glucose Monitoring Suppl (ACCU-CHEK AVIVA PLUS) w/Device KIT Use to check blood sugar three times daily  and as needed.  Diagnosis: E11.22  Insulin-dependent 05/16/17  Yes [name], MD  carvedilol (COREG) 25 MG tablet TAKE 1 TABLET TWICE DAILY WITH MEALS 07/15/18  Yes [name], MD  cloNIDine (CATAPRES) 0.3 MG tablet TAKE 1 TABLET THREE TIMES DAILY 12/31/17  Yes Gollan, Kathlene November, MD  clopidogrel (PLAVIX) 75 MG tablet Take 1 tablet (75 mg total) by mouth daily. 08/02/17  Yes Minna Merritts, MD  diclofenac sodium (VOLTAREN) 1 % GEL Apply 2 g topically 2 (two) times daily as needed (for left knee pain.). 03/25/18  Yes [name], MD  docusate sodium (COLACE) 100 MG capsule Take 100 mg by mouth daily. As needed.    Yes [provider]  ezetimibe (ZETIA) 10 MG tablet Take 1 tablet (10 mg total) by mouth daily. 08/23/17 08/14/18 Yes Gollan, Kathlene November, MD  gabapentin (NEURONTIN) 800 MG tablet TAKE 1 TABLET IN THE MORNING  AND TAKE 1 AND 1/2 TABLETS AT BEDTIME 12/30/17  Yes [name],  Elveria Rising, MD  glucose blood (ACCU-CHEK AVIVA PLUS) test strip Use as instructed to check blood sugar 3 times daily or as needed.  Diagnosis:  E11.22  Insulin-dependent. 05/16/17  Yes [name], MD  insulin NPH-regular Human (NOVOLIN 70/30) (70-30) 100 UNIT/ML injection INJECT  70 UNITS SUBCUTANEOUSLY EVERY MORNING  AND  50 units at bedtime 01/22/17  Yes [name], MD  Insulin Syringe-Needle U-100 (INSULIN SYRINGE 1CC/30GX5/16") 30G X 5/16" 1 ML MISC Use as directed to take insulin Dx 250.62 07/14/13  Yes Ria Bush, MD  isosorbide mononitrate (IMDUR) 60 MG 24 hr tablet Take 1.5 tablets (90 mg total) by mouth 2 (two) times daily. 08/23/17  Yes Gollan, Kathlene November, MD  nitroGLYCERIN (NITROSTAT) 0.4 MG SL tablet Place 1 tablet (0.4 mg total) under the tongue every 5 (five) minutes as needed. May repeat x3 01/18/17  Yes [name], MD  omeprazole (PRILOSEC) 20 MG capsule Take 20 mg by mouth daily.     Yes [provider]  oxyCODONE (ROXICODONE) 15 MG immediate release tablet Take 1  tablet (15 mg total) by mouth at bedtime as needed for pain. 06/24/18  Yes [name], MD  tamsulosin (FLOMAX) 0.4 MG CAPS capsule Take 1 capsule (0.4 mg total) by mouth at bedtime. 12/21/17  Yes [name], MD  torsemide (DEMADEX) 20 MG tablet TAKE 2 TABLETS EVERY DAY Patient taking differently: Take 40 mg by mouth every other day.  12/31/17  Yes Gollan, Kathlene November, MD  diphenhydrAMINE (BENADRYL) 50 MG tablet Take 50 mg by mouth at bedtime as needed. Pt takes every night    [provider]    Allergies Calcium channel blockers  Family History  Problem Relation Age of Onset  . Heart attack Father   . Heart attack Mother   . Heart attack Brother   . COPD Sister   . Arthritis Maternal Grandmother   . Breast cancer Maternal Aunt   . Diabetes Maternal Aunt   . Hypertension Other   . Hypertension Maternal Aunt   . Prostate cancer Neg Hx   . Kidney cancer Neg Hx   . Bladder Cancer Neg Hx   . Colon cancer Neg Hx     Social History Social History   Tobacco Use  . Smoking status: Never Smoker  . Smokeless tobacco: Never Used  Substance Use Topics  . Alcohol use: No    Alcohol/week: 0.0 standard drinks  . Drug use: No    Review of Systems  Constitutional: No fever/chills Eyes: No visual changes. ENT: No sore throat. Cardiovascular: Above Respiratory: As above Gastrointestinal: No abdominal pain.  No nausea, no vomiting.  No diarrhea.  No constipation. Genitourinary: Negative for dysuria. Musculoskeletal: Negative for back pain. Skin: Negative for rash. Neurological: Negative for headaches, focal weakness or numbness. ____________________________________________   PHYSICAL EXAM:  VITAL SIGNS: ED Triage Vitals [08/14/18 1349]  Enc Vitals Group     BP 127/65     Pulse Rate 62     Resp 18     Temp 99.4 F (37.4 C)     Temp Source Oral     SpO2 96 %     Weight 263 lb (119.3 kg)     Height _0  (1.651 m)     Head Circumference      Peak Flow       Pain Score 6     Pain Loc      Pain Edu?  Excl. in [place]?     Constitutional: Alert and oriented. Well appearing and in no acute distress. Eyes: Conjunctivae are normal.  Head: Atraumatic. Nose: No congestion/rhinnorhea. Mouth/Throat: Mucous membranes are moist.  Neck: No stridor.   Cardiovascular: Normal rate, regular rhythm. Grossly normal heart sounds.   Respiratory: Normal respiratory effort.  No retractions.  Expiratory cough with an expiratory wheezing throughout all fields.  Gastrointestinal: Soft and nontender. No distention. No CVA tenderness. Musculoskeletal: To severe bilateral lower extremity which the patient says is his baseline. Neurologic:  Normal speech and language. No gross focal neurologic deficits are appreciated. Skin:  Skin is warm, dry and intact. No rash noted. Psychiatric: Mood and affect are normal. Speech and behavior are normal.  ____________________________________________   LABS (all labs ordered are listed, but only abnormal results are displayed)  Labs Reviewed  BASIC METABOLIC PANEL - Abnormal; Notable for the following components:      Result Value   Glucose, Bld 64 (*)    BUN 26 (*)    Creatinine, Ser 1.96 (*)    Calcium 8.5 (*)    GFR calc non Af Amer 34 (*)    GFR calc Af Amer 39 (*)    All other components within normal limits  TROPONIN I - Abnormal; Notable for the following components:   Troponin I 0.04 (*)    All other components within normal limits  TROPONIN I - Abnormal; Notable for the following components:   Troponin I 0.03 (*)    All other components within normal limits  CBC  LIPASE, BLOOD  GLUCOSE, CAPILLARY  URINALYSIS, COMPLETE (UACMP) WITH MICROSCOPIC   ____________________________________________  EKG  ED ECG REPORT I, Doran Stabler, the attending physician, personally viewed and interpreted this ECG.   Date: 08/14/2018  EKG Time: 1356  Rate: 60  Rhythm: normal sinus rhythm  Axis: Normal   Intervals:left anterior fascicular block  ST&T Change: T wave inversions in 1 and aVL which are unchanged from previous.  No ST segment elevation or depression.  ____________________________________________  RADIOLOGY  Chest x-ray without acute finding.  Chronic bronchitic change ____________________________________________   PROCEDURES  Procedure(s) performed:   Procedures  Critical Care performed:   ____________________________________________   INITIAL IMPRESSION / ASSESSMENT AND PLAN / ED COURSE  Pertinent labs & imaging results that were available during my care of the patient were reviewed by me and considered in my medical decision making (see chart for details).  Differential includes, but is not limited to, viral syndrome, bronchitis including COPD exacerbation, pneumonia, reactive airway disease including asthma, CHF including exacerbation with or without pulmonary/interstitial edema, pneumothorax, ACS, thoracic trauma, and pulmonary embolism. As part of my medical decision making, I reviewed the following data within the electronic MEDICAL RECORD NUMBER Notes from prior ED visits  ----------------------------------------- 6:19 PM on 08/14/2018 -----------------------------------------  Patient at this time feeling subjectively improved.  Says that he is coughing less.  On risk rotation of his lungs he has only scant wheezes throughout.  No labored respirations.  Patient will be given a Z-Pak as well as prednisone and an inhaler to be used at home.  Likely bronchitis.  Says that he uses as needed O2 at home but has been saturating well on room air oxygen here in the emergency department.  He is understanding to return immediately for any worsening or concerning symptoms.  Otherwise, follow with primary care. ____________________________________________   FINAL CLINICAL IMPRESSION(S) / ED DIAGNOSES  Bronchitis.  NEW MEDICATIONS STARTED DURING THIS VISIT:  New  Prescriptions   No medications on file     Note:  This document was prepared using Dragon voice recognition software and may include unintentional dictation errors.     Orbie Pyo, MD 08/14/18 Tresa Moore

## 2018-08-14 NOTE — ED Provider Notes (Signed)
[facility] ____________________________________________  Time seen: Approximately 1:01 PM  I have reviewed the triage vital signs and the nursing notes.   HISTORY  Chief Complaint Cough   HPI Stephen [name] is a 73 y.o. male past medical history of CAD, MI, CKD, GERD, hypertension, pulmonary fibrosis on 2 L O2 at all times presenting with wife at bedside for evaluation of 3 to 4 days of cough, congestion, nasal drainage.  Reports over the last 2 days he has been noticing intermittent wheezing with accompanying shortness of breath that is atypical for him.  Increased coughing.  States last night while coughing he began feeling left-sided chest pain that did not feel consistent with soreness from the actual cough.  States chest pain lasted briefly and would come and go but has not returned this morning.  Denies any current chest pain.  Denies pain radiation, paresthesias, syncope or near syncope.  Chronic bilateral lower extremity edema without acute change.  States some low-grade fevers.  Denies known direct sick contacts, but did have cataract extraction last week and was in and out of doctors offices.  Continues to tolerate fluids well.  [name], MD: PCP   Past Medical History:  Diagnosis Date  . Adrenal gland anomaly   . Arthritis   . CAD (coronary artery disease)    a. 04/2006 MI and PCI/stenting to mLCx & mRCA; b. 07/2009 Cath: patent LCX/RCA stents;  c. 12/2016 NSTEMI/Cath: LM 30, LAD 30p, 9m 30d, D1/2/3 min irregs, LCX 1052mSR, OM1 min irregs, RCA 20p, 10 ISR, 707m0d, RPDA min irregs, RPAV 40-->Med Rx.  . CKD (chronic kidney disease), stage III ([place]CC[place] . Colon polyps   . Diabetes mellitus with neuropathy (HCCDe[place] . DM2 (diabetes mellitus, type 2) (HCC)    insulin requiring  . Dyslipidemia   . GERD (gastroesophageal reflux disease)   . History of echocardiogram    a. TTE 12/2016: EF 50-55%, mod concentric LVH, images inadequate for wall motion  assessment, not technically sufficienct to allow for LV dias fxn, calcified mitral annulus   . HTN (hypertension)   . Kidney stones   . Left Renal artery stenosis (HCCR[place]  a. 07/2013 s/p PTA/stenting ([place]).  . Myocardial infarction ([place]CCC[place] . OSA (obstructive sleep apnea)   . Pulmonary fibrosis ([place]CC[place]  a. 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  . Recurrent UTI   . Rotator cuff disorder    right  . Skin cancer    head  . Thyroid disorder   . Venous insufficiency of both lower extremities    a. s/p laser treatment.    Patient Active Problem List   Diagnosis Date Noted  . Knee pain 03/26/2018  . Contusion of foot 02/15/2018  . Encounter for chronic pain management 12/23/2017  . Healthcare maintenance 09/21/2017  . Skin lesion 06/15/2017  . Acute on chronic renal failure (HCCBaggs7/16/2018  . Coronary stent occlusion: Occluded circumflex stent 01/13/2017  . Accelerated hypertension with diastolic congestive heart failure, NYHA class 3 (HCCKeyeselevated LVEDP on chronic catheterization. 01/13/2017  . NSTEMI (non-ST elevated myocardial infarction) (HCCCrescent City7/06/2017  . Blood in urine 12/08/2016  . Lymphedema 10/23/2016  . Low back pain with right-sided sciatica 04/03/2014  . Muscle spasm 04/24/2013  . Chronic edema 11/13/2012  . Leg edema, right 09/29/2012  . SK (seborrheic keratosis) 09/29/2012  . Chronic diastolic CHF (congestive heart failure) (HCCTonica6/14/2013  . Sebaceous  cyst 11/28/2011  . Morbid obesity ([place]) 07/27/2011  . Hyperlipidemia with target low density lipoprotein (LDL) cholesterol less than 70 mg/dL 04/26/2011  . Adrenal adenoma 03/02/2011  . Back pain 12/06/2010  . Kidney stones 12/06/2010  . Essential hypertension, benign 10/28/2010  . PULMONARY FIBROSIS 09/15/2010  . Coronary artery disease involving native coronary artery of native heart with angina pectoris ([place]) 09/01/2010  . Type 2 diabetes mellitus with renal  complication ([place]) 24/03/7352  . METABOLIC SYNDROME X 29/92/4268  . SLEEP APNEA, OBSTRUCTIVE 08/31/2010  . Chronic kidney disease, stage III (moderate) ([place]) 08/31/2010    Past Surgical History:  Procedure Laterality Date  . adrenal adenoma removal  1990-   right  . CARPAL TUNNEL RELEASE     bilateral  . CATARACT EXTRACTION W/PHACO Right 07/08/2018   Procedure: CATARACT EXTRACTION PHACO AND INTRAOCULAR LENS PLACEMENT (IOC) RIGHT DIABETIC;  Surgeon: Eulogio Bear, MD;  Location: [place];  Service: Ophthalmology;  Laterality: Right;  Diabetic - insulin sleep apnea  . CATARACT EXTRACTION W/PHACO Left 08/05/2018   Procedure: CATARACT EXTRACTION PHACO AND INTRAOCULAR LENS PLACEMENT ([place]) LEFT DIABETIC;  Surgeon: Eulogio Bear, MD;  Location: [place];  Service: Ophthalmology;  Laterality: Left;  DIABETIC  . CORONARY ANGIOPLASTY WITH STENT PLACEMENT  2007   x 2  . KNEE SURGERY     right  . LEFT HEART CATH AND CORONARY ANGIOGRAPHY N/A 01/11/2017   Procedure: Left Heart Cath and Coronary Angiography;  Surgeon: Wellington Hampshire, MD;  Location: [place] [facility];  Service: Cardiovascular;  Laterality: N/A;  . LUNG BIOPSY    . RENAL ARTERY STENT  2015   L  . SKIN CANCER EXCISION     back of head     No current facility-administered medications for this encounter.   Current Outpatient Medications:  .  amLODipine (NORVASC) 5 MG tablet, TAKE 1 TABLET (5 MG TOTAL) BY MOUTH DAILY., Disp: 90 tablet, Rfl: 3 .  aspirin 81 MG tablet, Take 324 mg by mouth daily. , Disp: , Rfl:  .  atorvastatin (LIPITOR) 80 MG tablet, TAKE 1 TABLET AT BEDTIME, Disp: 90 tablet, Rfl: 2 .  carvedilol (COREG) 25 MG tablet, TAKE 1 TABLET TWICE DAILY WITH MEALS, Disp: 180 tablet, Rfl: 2 .  cloNIDine (CATAPRES) 0.3 MG tablet, TAKE 1 TABLET THREE TIMES DAILY, Disp: 270 tablet, Rfl: 3 .  clopidogrel (PLAVIX) 75 MG tablet, Take 1 tablet (75 mg total) by mouth daily., Disp: 90 tablet, Rfl: 3 .   clotrimazole-betamethasone (LOTRISONE) cream, Apply 1 application topically 2 (two) times daily., Disp: 30 g, Rfl: 0 .  diclofenac sodium (VOLTAREN) 1 % GEL, Apply 2 g topically 2 (two) times daily as needed (for left knee pain.)., Disp: 100 g, Rfl: 1 .  diphenhydrAMINE (BENADRYL) 50 MG tablet, Take 50 mg by mouth at bedtime as needed. Pt takes every night, Disp: , Rfl:  .  gabapentin (NEURONTIN) 800 MG tablet, TAKE 1 TABLET IN THE MORNING  AND TAKE 1 AND 1/2 TABLETS AT BEDTIME, Disp: 225 tablet, Rfl: 3 .  glucose blood (ACCU-CHEK AVIVA PLUS) test strip, Use as instructed to check blood sugar 3 times daily or as needed.  Diagnosis:  E11.22  Insulin-dependent., Disp: 300 each, Rfl: 1 .  insulin NPH-regular Human (NOVOLIN 70/30) (70-30) 100 UNIT/ML injection, INJECT  70 UNITS SUBCUTANEOUSLY EVERY MORNING  AND  50 units at bedtime, Disp: 10 vial, Rfl: 1 .  Insulin Syringe-Needle U-100 (INSULIN SYRINGE 1CC/30GX5/16") 30G X 5/16"  1 ML MISC, Use as directed to take insulin Dx 250.62, Disp: 180 each, Rfl: 1 .  isosorbide mononitrate (IMDUR) 60 MG 24 hr tablet, Take 1.5 tablets (90 mg total) by mouth 2 (two) times daily., Disp: 270 tablet, Rfl: 3 .  nitroGLYCERIN (NITROSTAT) 0.4 MG SL tablet, Place 1 tablet (0.4 mg total) under the tongue every 5 (five) minutes as needed. May repeat x3, Disp: 25 tablet, Rfl: prn .  NON FORMULARY, Oxygen 2 liters 24/7, Disp: , Rfl:  .  omeprazole (PRILOSEC) 20 MG capsule, Take 20 mg by mouth daily.  , Disp: , Rfl:  .  oxyCODONE (ROXICODONE) 15 MG immediate release tablet, Take 1 tablet (15 mg total) by mouth at bedtime as needed for pain., Disp: 30 tablet, Rfl: 0 .  tamsulosin (FLOMAX) 0.4 MG CAPS capsule, Take 1 capsule (0.4 mg total) by mouth at bedtime., Disp: 30 capsule, Rfl: 0 .  torsemide (DEMADEX) 20 MG tablet, TAKE 2 TABLETS EVERY DAY, Disp: 180 tablet, Rfl: 3 .  ACCU-CHEK SOFTCLIX LANCETS lancets, Use as instructed to check blood sugar three times daily and as needed.   Diagnosis: E11.22  Insulin-dependent., Disp: 100 each, Rfl: 5 .  Alcohol Swabs (B-D SINGLE USE SWABS REGULAR) PADS, Use to cleans area prior to checking blood sugar three times daily and as needed.  Diagnosis:  E11.22  Insulin dependent., Disp: 100 each, Rfl: 5 .  Blood Glucose Monitoring Suppl (ACCU-CHEK AVIVA PLUS) w/Device KIT, Use to check blood sugar three times daily and as needed.  Diagnosis: E11.22  Insulin-dependent, Disp: 1 kit, Rfl: 0 .  docusate sodium (COLACE) 100 MG capsule, Take 100 mg by mouth 2 (two) times daily. As needed., Disp: , Rfl:  .  ezetimibe (ZETIA) 10 MG tablet, Take 1 tablet (10 mg total) by mouth daily., Disp: 90 tablet, Rfl: 3  Allergies Calcium channel blockers  Family History  Problem Relation Age of Onset  . Heart attack Father   . Heart attack Mother   . Heart attack Brother   . COPD Sister   . Arthritis Maternal Grandmother   . Breast cancer Maternal Aunt   . Diabetes Maternal Aunt   . Hypertension Other   . Hypertension Maternal Aunt   . Prostate cancer Neg Hx   . Kidney cancer Neg Hx   . Bladder Cancer Neg Hx   . Colon cancer Neg Hx     Social History Social History   Tobacco Use  . Smoking status: Never Smoker  . Smokeless tobacco: Never Used  Substance Use Topics  . Alcohol use: No    Alcohol/week: 0.0 standard drinks  . Drug use: No    Review of Systems Constitutional: Positive low grade fevers.  ENT: Some sore throat. Positive congestion.  Cardiovascular: Intermittent chest pain. Respiratory: Positive shortness of breath. Gastrointestinal: No abdominal pain.  Skin: Negative for rash.   ____________________________________________   PHYSICAL EXAM:  VITAL SIGNS: ED Triage Vitals  Enc Vitals Group     BP 08/14/18 1115 (!) 156/67     Pulse Rate 08/14/18 1115 65     Resp 08/14/18 1115 20     Temp 08/14/18 1115 99.2 F (37.3 C)     Temp Source 08/14/18 1115 Oral     SpO2 08/14/18 1115 98 %     Weight 08/14/18 1111 263  lb (119.3 kg)     Height 08/14/18 1111 '5\' 5"'$  (1.651 m)     Head Circumference --      Peak  Flow --      Pain Score 08/14/18 1111 6     Pain Loc --      Pain Edu? --      Excl. in [place]? --     Constitutional: Alert and oriented. Well appearing and in no acute distress. Head: Atraumatic. No sinus tenderness to palpation. No swelling. No erythema.  Ears: no erythema, normal TMs bilaterally.   Nose:Nasal congestion   Mouth/Throat: Mucous membranes are moist. No pharyngeal erythema. No tonsillar swelling or exudate.  Neck: No stridor.  No cervical spine tenderness to palpation. Hematological/Lymphatic/Immunilogical: No cervical lymphadenopathy. Cardiovascular: Normal rate, regular rhythm. Grossly normal heart sounds.  Good peripheral circulation. Respiratory: Normal respiratory effort.  Scattered rhonchi.  Diffuse inspiratory and expiratory wheezes. Gastrointestinal: Soft and nontender.  Musculoskeletal: Ambulatory with steady gait.  Bilateral lower extremity edema. Neurologic:  Normal speech and language. No gait instability. Skin:  Skin appears warm, dry and intact. No rash noted. Psychiatric: Mood and affect are normal. Speech and behavior are normal. ___________________________________________   LABS (all labs ordered are listed, but only abnormal results are displayed)  Labs Reviewed - No data to display ____________________________________________  EKG  ED ECG REPORT I, Marylene Land, the attending provider and Dr Lacinda Axon , personally viewed and interpreted this ECG.   Date: 08/14/2018  EKG Time: 1213  Rate: 63  Rhythm: normal sinus rhythm  Intervals:left anterior fascicular block.   ST&T Change: no clear changes compared to previous ecg Compared to ecg 01/07/18 with similar appearance   PROCEDURES Procedures    INITIAL IMPRESSION / ASSESSMENT AND PLAN / ED COURSE  Pertinent labs & imaging results that were available during my care of the patient were reviewed by me and  considered in my medical decision making (see chart for details).  Patient presenting with wife at bedside.  Patient with significant comorbidities, presenting with likely viral illness.  However concern for secondary infection as well as diffuse wheezing.  2 albuterol nebs given in urgent care with some improved wheezing, continues with shortness of breath.  EKG is similar to previous.  Denies chest pain at this time.  Due to comorbidities, recommend further evaluation in ER at this time.  Patient states that his wife will take him and verbalized understand risk by private transport.  Patient states they will go directly to [facility].  Triage nurse called and given report. ____________________________________________   FINAL CLINICAL IMPRESSION(S) / ED DIAGNOSES  Final diagnoses:  Upper respiratory tract infection, unspecified type  SOB (shortness of breath)  Wheezing  Intermittent chest pain     ED Discharge Orders    None       Note: This dictation was prepared with Dragon dictation along with smaller phrase technology. Any transcriptional errors that result from this process are unintentional.         Marylene Land, NP 08/14/18 1332

## 2018-08-15 NOTE — Telephone Encounter (Signed)
Noted. Thanks.

## 2018-08-15 NOTE — Telephone Encounter (Signed)
Spoke with wife who says he seems to be doing some better today. Wife was getting his medication at the pharmacy at the time of the call but I understood her to say that the patient had a double nebulizer treatment at [facility] and apparently went to ER as well with a triple neb treatment.  Patient did not cough as much last evening but had some sweats during the night.  Wife will be making him a FU appt in a week as instructed.

## 2018-08-20 ENCOUNTER — Ambulatory Visit: Payer: Medicare HMO | Admitting: Family Medicine

## 2018-08-21 ENCOUNTER — Encounter: Payer: Self-pay | Admitting: Family Medicine

## 2018-08-21 ENCOUNTER — Ambulatory Visit (INDEPENDENT_AMBULATORY_CARE_PROVIDER_SITE_OTHER): Payer: Medicare HMO | Admitting: Family Medicine

## 2018-08-21 VITALS — BP 132/66 | HR 61 | Temp 97.9°F | Ht 65.0 in | Wt 270.2 lb

## 2018-08-21 DIAGNOSIS — R0981 Nasal congestion: Secondary | ICD-10-CM

## 2018-08-21 DIAGNOSIS — R059 Cough, unspecified: Secondary | ICD-10-CM

## 2018-08-21 DIAGNOSIS — R05 Cough: Secondary | ICD-10-CM

## 2018-08-21 MED ORDER — BENZONATATE 100 MG PO CAPS: 100.0000 mg | ORAL_CAPSULE | Freq: Three times a day (TID) | ORAL | 0 refills | Status: DC | PRN

## 2018-08-21 NOTE — Patient Instructions (Signed)
Good to see you today  Your antibiotic is still in your system for at least 5 more days to take care of any bacteria in your sinuses and lungs  Add plain mucinex (store brand is fine) daily to help with chest congestion   I have sent a cough pill to your pharmacy

## 2018-08-21 NOTE — Progress Notes (Signed)
Subjective:    Patient ID: Stephen [name], male    DOB: [birth year], 73 y.o.   MRN: [id number]  HPI This is a 73 yo male who presents today for follow up of recent UC/ER visit on 08/14/18. He was seen at [facility] and they felt that ER eval was needed. He presented with chest congestion, cough, chest pain. He was diagnosed with bronchitis and given Zpack, prednisone 50 mg x 5 days and albuterol inhaler (using 2x/ day). Continues to have congestion in chest and head. Cough is dry and improved. Not taking anything for cough.  Blood sugars in 200s on prednisone, improving now that he is off.  Increased creatinine in ER, he has follow up with nephrologist upcoming.   Past Medical History:  Diagnosis Date  . Adrenal gland anomaly   . Arthritis   . CAD (coronary artery disease)    a. 04/2006 MI and PCI/stenting to mLCx & mRCA; b. 07/2009 Cath: patent LCX/RCA stents;  c. 12/2016 NSTEMI/Cath: LM 30, LAD 30p, 30m, 30d, D1/2/3 min irregs, LCX 150m ISR, OM1 min irregs, RCA 20p, 10 ISR, 35m, 50d, RPDA min irregs, RPAV 40-->Med Rx.  . CKD (chronic kidney disease), stage III ([place])   . Colon polyps   . Diabetes mellitus with neuropathy ([place])   . DM2 (diabetes mellitus, type 2) (HCC)    insulin requiring  . Dyslipidemia   . GERD (gastroesophageal reflux disease)   . History of echocardiogram    a. TTE 12/2016: EF 50-55%, mod concentric LVH, images inadequate for wall motion assessment, not technically sufficienct to allow for LV dias fxn, calcified mitral annulus   . HTN (hypertension)   . Kidney stones   . Left Renal artery stenosis ([place])    a. 07/2013 s/p PTA/stenting ([place]).  . Myocardial infarction ([place])   . OSA (obstructive sleep apnea)   . Pulmonary fibrosis ([place])    a. 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  . Recurrent UTI   . Rotator cuff disorder    right  . Skin cancer    head  . Thyroid disorder   . Venous insufficiency of both lower  extremities    a. s/p laser treatment.   Past Surgical History:  Procedure Laterality Date  . adrenal adenoma removal  1990-   right  . CARPAL TUNNEL RELEASE     bilateral  . CATARACT EXTRACTION W/PHACO Right 07/08/2018   Procedure: CATARACT EXTRACTION PHACO AND INTRAOCULAR LENS PLACEMENT (IOC) RIGHT DIABETIC;  Surgeon: Eulogio Bear, MD;  Location: [place];  Service: Ophthalmology;  Laterality: Right;  Diabetic - insulin sleep apnea  . CATARACT EXTRACTION W/PHACO Left 08/05/2018   Procedure: CATARACT EXTRACTION PHACO AND INTRAOCULAR LENS PLACEMENT ([place]) LEFT DIABETIC;  Surgeon: Eulogio Bear, MD;  Location: [place] [place];  Service: Ophthalmology;  Laterality: Left;  DIABETIC  . CORONARY ANGIOPLASTY WITH STENT PLACEMENT  2007   x 2  . KNEE SURGERY     right  . LEFT HEART CATH AND CORONARY ANGIOGRAPHY N/A 01/11/2017   Procedure: Left Heart Cath and Coronary Angiography;  Surgeon: Wellington Hampshire, MD;  Location: [place] [facility];  Service: Cardiovascular;  Laterality: N/A;  . LUNG BIOPSY    . RENAL ARTERY STENT  2015   L  . SKIN CANCER EXCISION     back of head   Family History  Problem Relation Age of Onset  . Heart attack Father   . Heart attack  Mother   . Heart attack Brother   . COPD Sister   . Arthritis Maternal Grandmother   . Breast cancer Maternal Aunt   . Diabetes Maternal Aunt   . Hypertension Other   . Hypertension Maternal Aunt   . Prostate cancer Neg Hx   . Kidney cancer Neg Hx   . Bladder Cancer Neg Hx   . Colon cancer Neg Hx    Social History   Tobacco Use  . Smoking status: Never Smoker  . Smokeless tobacco: Never Used  Substance Use Topics  . Alcohol use: No    Alcohol/week: 0.0 standard drinks  . Drug use: No      Review of Systems Per HPI    Objective:   Physical Exam Vitals signs reviewed.  Constitutional:      General: He is not in acute distress.    Appearance: Normal appearance. He is obese. He is not  ill-appearing, toxic-appearing or diaphoretic.  HENT:     Head: Normocephalic and atraumatic.     Right Ear: Tympanic membrane and external ear normal.     Left Ear: Tympanic membrane and external ear normal.     Ears:     Comments: Bilateral canals flaky.     Nose: No congestion or rhinorrhea.     Right Sinus: No maxillary sinus tenderness or frontal sinus tenderness.     Left Sinus: No maxillary sinus tenderness or frontal sinus tenderness.     Comments: Nares patent.     Mouth/Throat:     Mouth: Mucous membranes are moist.     Pharynx: Oropharynx is clear. No oropharyngeal exudate or posterior oropharyngeal erythema.  Eyes:     Conjunctiva/sclera: Conjunctivae normal.  Neck:     Musculoskeletal: Normal range of motion and neck supple. No neck rigidity or muscular tenderness.  Cardiovascular:     Rate and Rhythm: Normal rate and regular rhythm.     Heart sounds: Normal heart sounds.  Pulmonary:     Effort: Pulmonary effort is normal. No respiratory distress.     Breath sounds: No stridor. No wheezing or rales.     Comments: Few rhonchi posterior left, cleared with coughing.  Lymphadenopathy:     Cervical: No cervical adenopathy.  Neurological:     Mental Status: He is alert and oriented to person, place, and time.  Psychiatric:        Mood and Affect: Mood normal.        Behavior: Behavior normal.        Thought Content: Thought content normal.        Judgment: Judgment normal.       BP 132/66 (BP Location: Left Arm, Patient Position: Sitting, Cuff Size: Large)   Pulse 61   Temp 97.9 F (36.6 C) (Oral)   Ht 5\' 5"  (1.651 m)   Wt 270 lb 4 oz (122.6 kg)   SpO2 96%   BMI 44.97 kg/m  Wt Readings from Last 3 Encounters:  08/21/18 270 lb 4 oz (122.6 kg)  08/14/18 263 lb (119.3 kg)  08/14/18 263 lb (119.3 kg)       Assessment & Plan:  1. Cough - resolving bronchitis, just finished azithromycin, explained that it is still in his system and will cover for bacterial  infection in his sinuses and lungs - Provided written and verbal information regarding diagnosis and treatment. -  Patient Instructions  Good to see you today  Your antibiotic is still in your system for at least 5  more days to take care of any bacteria in your sinuses and lungs  Add plain mucinex (store brand is fine) daily to help with chest congestion   I have sent a cough pill to your pharmacy   - benzonatate (TESSALON) 100 MG capsule; Take 1-2 capsules (100-200 mg total) by mouth 3 (three) times daily as needed.  Dispense: 30 capsule; Refill: 0  2. Nasal congestion - Mucinex prn, continue good fluid intake   Clarene Reamer, FNP-BC  [facility]  08/21/2018 1:47 PM

## 2018-08-25 DIAGNOSIS — J982 Interstitial emphysema: Secondary | ICD-10-CM | POA: Diagnosis not present

## 2018-09-06 DIAGNOSIS — J449 Chronic obstructive pulmonary disease, unspecified: Secondary | ICD-10-CM | POA: Diagnosis not present

## 2018-09-11 DIAGNOSIS — E113213 Type 2 diabetes mellitus with mild nonproliferative diabetic retinopathy with macular edema, bilateral: Secondary | ICD-10-CM | POA: Diagnosis not present

## 2018-09-16 ENCOUNTER — Other Ambulatory Visit: Payer: Self-pay

## 2018-09-16 ENCOUNTER — Encounter: Payer: Self-pay | Admitting: Cardiovascular Disease

## 2018-09-16 ENCOUNTER — Ambulatory Visit: Payer: Medicare HMO | Admitting: Cardiovascular Disease

## 2018-09-16 VITALS — BP 124/60 | HR 59 | Ht 65.0 in | Wt 262.0 lb

## 2018-09-16 DIAGNOSIS — I25119 Atherosclerotic heart disease of native coronary artery with unspecified angina pectoris: Secondary | ICD-10-CM

## 2018-09-16 DIAGNOSIS — I10 Essential (primary) hypertension: Secondary | ICD-10-CM

## 2018-09-16 MED ORDER — EZETIMIBE 10 MG PO TABS: 10.0000 mg | ORAL_TABLET | Freq: Every day | ORAL | 3 refills | Status: DC

## 2018-09-16 MED ORDER — ISOSORBIDE MONONITRATE ER 60 MG PO TB24: 90.0000 mg | ORAL_TABLET | Freq: Two times a day (BID) | ORAL | 3 refills | Status: DC

## 2018-09-16 MED ORDER — CLONIDINE HCL 0.3 MG PO TABS: 0.3000 mg | ORAL_TABLET | Freq: Three times a day (TID) | ORAL | 3 refills | Status: DC

## 2018-09-16 MED ORDER — AMLODIPINE BESYLATE 5 MG PO TABS: 5.0000 mg | ORAL_TABLET | Freq: Every day | ORAL | 3 refills | Status: DC

## 2018-09-16 MED ORDER — POTASSIUM CHLORIDE ER 10 MEQ PO TBCR: 10.0000 meq | EXTENDED_RELEASE_TABLET | Freq: Every day | ORAL | 0 refills | Status: DC

## 2018-09-16 MED ORDER — POTASSIUM CHLORIDE ER 10 MEQ PO TBCR: 10.0000 meq | EXTENDED_RELEASE_TABLET | Freq: Every day | ORAL | 3 refills | Status: DC

## 2018-09-16 NOTE — Progress Notes (Signed)
Cardiology Office Note  Date:  09/16/2018   ID:  Rylee, Nuzum 11-Apr-1946, MRN [id number]  PCP:  [name], MD   Chief Complaint  Patient presents with  . Other    6 month follow up. Patient c/o swelling in ankles and increased SOB. Meds reviewed verbally with patient.     HPI:  Mr. Batch is a very pleasant 73 y.o. 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 Status post stent to his left renal artery for 80+ percent stenosis several weeks ago, performed by Dr. Lucky Cowboy. laser treatment of the lower extremity veins for leg swelling/venous insufficiency who presents for routine followup of his coronary artery disease and shortness of breath  INTERVAL HISTORY: The patient reports today for follow up. He is accompanied by his wife today.  Has been having increasing shortness of breath. His O2 sat has gone down to 80% on room air with oxygen walking around his house, thinks it is dropping more than normal  normally 95-97% on oxygen. Wife endorses he drinks a lot of water..There is leg swelling. A month ago he had an upper respiratory infection which was causing him to cough excessively and experience chest pain. The chest pain has improved.  He did do a short course of prednisone, made his sugars run high  His most recent Myoview in July 2019 showed region of old infarct, possible region of ischemia though significant attenuation artifact No catheterization at the time as he was relatively asymptomatic, had renal dysfunction  He has taken too much torsemide before which caused him to go to the hospital. He has not taken his torsemide today.   He  sometimes walks around at home without his oxygen, but usually his O2 sat does not drop much. It's only until recently that he's experienced his O2 sat dropping to 80% when walking w/o his oxygen.   He has been getting shots in his eyes for neuropathy, which has worsened. He has had a total of 6 shots so far.   His catheterization in July 2018 did show elevated left ventricular end-diastolic pressure Prior echocardiogram also with elevated pressure  Blood pressure 124/60 Total Chol 91/ LDL 45 CR 1.96 Glucose 64  EKG personally reviewed by myself on todays visit Shows sinus bradycardia rhythm. 59 bpm. Left anterior fascicular block. Left ventricular hypertrophy with repolarization abnormality. Inferior infarct, age undetermined. Anterior infarct, age undetermined.    OTHER PAST MEDICAL HISTORY REVIEWED BY ME FOR TODAY'S VISIT: Stress test ordered for shortness of breath Mages pulled up and discussed with him  defect lateral wall Known occluded left circumflex vessel Ejection fraction 30-45%  Cath 01/11/2017, occluded LCX, with collaterals Distal RCA stenosis in-stent 70%, medical management at this time  Echocardiogram ejection fraction 50-55%  Stress test July 2011 was dobutamine stress that showed no significant ischemia, inadequate heart rate was achieved. right and left heart catheterization January 2011 showed 50% mid LAD disease, 60% at the ostium of the diagonal #2, 30% proximal left circumflex disease, patent stent in the left circumflex, patent stent of the mid RCA, wedge pressure of 16, PA pressure mean 22, right ventricular pressure 41/8 .  echocardiogram September 2010 shows normal systolic function,  diastolic dysfunction, normal RV function unable to evaluate right ventricular systolic pressures  PMH:   has a past medical history of Adrenal gland anomaly, Arthritis, CAD (coronary artery disease), CKD (chronic kidney disease), stage III ([place]), Colon polyps, Diabetes  mellitus with neuropathy ([place]), DM2 (diabetes mellitus, type 2) ([place]), Dyslipidemia, GERD (gastroesophageal reflux disease), History of echocardiogram, HTN (hypertension), Kidney stones, Left Renal artery stenosis ([place]), Myocardial infarction ([place]), OSA (obstructive sleep apnea), Pulmonary fibrosis ([place]), Recurrent UTI, Rotator cuff disorder, Skin cancer, Thyroid disorder, and Venous insufficiency of both lower extremities.  PSH:    Past Surgical History:  Procedure Laterality Date  . adrenal adenoma removal  1990-   right  . CARPAL TUNNEL RELEASE     bilateral  . CATARACT EXTRACTION W/PHACO Right 07/08/2018   Procedure: CATARACT EXTRACTION PHACO AND INTRAOCULAR LENS PLACEMENT ([facility]) RIGHT DIABETIC;  Surgeon: Eulogio Bear, MD;  Location: [facility];  Service: Ophthalmology;  Laterality: Right;  Diabetic - insulin sleep apnea  . CATARACT EXTRACTION W/PHACO Left 08/05/2018   Procedure: CATARACT EXTRACTION PHACO AND INTRAOCULAR LENS PLACEMENT ([place]) LEFT DIABETIC;  Surgeon: Eulogio Bear, MD;  Location: [facility];  Service: Ophthalmology;  Laterality: Left;  DIABETIC  . CORONARY ANGIOPLASTY WITH STENT PLACEMENT  2007   x 2  . KNEE SURGERY     right  . LEFT HEART CATH AND CORONARY ANGIOGRAPHY N/A 01/11/2017   Procedure: Left Heart Cath and Coronary Angiography;  Surgeon: Wellington Hampshire, MD;  Location: [facility];  Service: Cardiovascular;  Laterality: N/A;  . LUNG BIOPSY    . RENAL ARTERY STENT  2015   L  . SKIN CANCER EXCISION     back of head    Current Outpatient Medications  Medication Sig Dispense Refill  . ACCU-CHEK SOFTCLIX LANCETS lancets Use as instructed to check blood sugar three times daily and as needed.  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 100 each 5  . albuterol (PROVENTIL HFA;VENTOLIN HFA) 108 (90 Base) MCG/ACT inhaler Inhale 2 puffs into the lungs every 6 (six) hours as needed for wheezing or shortness of breath. 1 Inhaler 2  . Alcohol  Swabs (B-D SINGLE USE SWABS REGULAR) PADS Use to cleans area prior to checking blood sugar three times daily and as needed.  Diagnosis:  E11.22  Insulin dependent. 100 each 5  . amLODipine (NORVASC) 5 MG tablet TAKE 1 TABLET (5 MG TOTAL) BY MOUTH DAILY. 90 tablet 3  . aspirin 81 MG tablet Take 324 mg by mouth daily.     Marland Kitchen atorvastatin (LIPITOR) 80 MG tablet TAKE 1 TABLET AT BEDTIME 90 tablet 2  . benzonatate (TESSALON) 100 MG capsule Take 1-2 capsules (100-200 mg total) by mouth 3 (three) times daily as needed. 30 capsule 0  . Blood Glucose Monitoring Suppl (ACCU-CHEK AVIVA PLUS) w/Device KIT Use to check blood sugar three times daily and as needed.  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 carvedilol (COREG) 25 MG tablet TAKE 1 TABLET TWICE DAILY WITH MEALS 180 tablet 2  . cloNIDine (CATAPRES) 0.3 MG tablet TAKE 1 TABLET THREE TIMES DAILY 270 tablet 3  . clopidogrel (PLAVIX) 75 MG tablet Take 1 tablet (75 mg total) by mouth daily. 90 tablet 3  . diclofenac sodium (VOLTAREN) 1 % GEL Apply 2 g topically 2 (two) times daily as needed (for left knee pain.). 100 g 1  . diphenhydrAMINE (BENADRYL) 50 MG tablet Take 50 mg by mouth at bedtime as needed. Pt takes every  night    . docusate sodium (COLACE) 100 MG capsule Take 100 mg by mouth daily. As needed.     . gabapentin (NEURONTIN) 800 MG tablet TAKE 1 TABLET IN THE MORNING  AND TAKE 1 AND 1/2 TABLETS AT BEDTIME 225 tablet 3  . glucose blood (ACCU-CHEK AVIVA PLUS) test strip Use as instructed to check blood sugar 3 times daily or as needed.  Diagnosis:  E11.22  Insulin-dependent. 300 each 1  . insulin NPH-regular Human (NOVOLIN 70/30) (70-30) 100 UNIT/ML injection INJECT  70 UNITS SUBCUTANEOUSLY EVERY MORNING  AND  50 units at bedtime 10 vial 1  . Insulin Syringe-Needle U-100 (INSULIN SYRINGE 1CC/30GX5/16") 30G X 5/16" 1 ML MISC Use as directed to take insulin Dx 250.62 180 each 1  . isosorbide mononitrate (IMDUR) 60 MG 24 hr tablet Take 1.5  tablets (90 mg total) by mouth 2 (two) times daily. 270 tablet 3  . nitroGLYCERIN (NITROSTAT) 0.4 MG SL tablet Place 1 tablet (0.4 mg total) under the tongue every 5 (five) minutes as needed. May repeat x3 25 tablet prn  . omeprazole (PRILOSEC) 20 MG capsule Take 20 mg by mouth daily.      Marland Kitchen oxyCODONE (ROXICODONE) 15 MG immediate release tablet Take 1 tablet (15 mg total) by mouth at bedtime as needed for pain. 30 tablet 0  . tamsulosin (FLOMAX) 0.4 MG CAPS capsule Take 1 capsule (0.4 mg total) by mouth at bedtime. 30 capsule 0  . torsemide (DEMADEX) 20 MG tablet TAKE 2 TABLETS EVERY DAY 180 tablet 3  . ezetimibe (ZETIA) 10 MG tablet Take 1 tablet (10 mg total) by mouth daily. 90 tablet 3   No current facility-administered medications for this visit.      Allergies:   Calcium channel blockers   Social History:  The patient  reports that he has never smoked. He has never used smokeless tobacco. He reports that he does not drink alcohol or use drugs.   Family History:   family history includes Arthritis in his maternal grandmother; Breast cancer in his maternal aunt; COPD in his sister; Diabetes in his maternal aunt; Heart attack in his brother, father, and mother; Hypertension in his maternal aunt and another family member.    Review of Systems: Review of Systems  Constitutional: Negative.   Eyes: Negative.   Respiratory: Positive for shortness of breath.   Cardiovascular: Positive for leg swelling.  Gastrointestinal: Negative.   Genitourinary: Negative.   Musculoskeletal: Negative.   Neurological: Negative.   Psychiatric/Behavioral: Negative.   All other systems reviewed and are negative.   PHYSICAL EXAM: VS:  BP 124/60 (BP Location: Left Arm, Patient Position: Sitting, Cuff Size: Normal)   Pulse (!) 59   Ht _0  (1.651 m)   Wt 262 lb (118.8 kg)   BMI 43.60 kg/m  , BMI Body mass index is 43.6 kg/m.   Constitutional:  oriented to person, place, and time. No distress.  HENT:  normal Head: Grossly normal Eyes:  no discharge. No scleral icterus.  Neck: No JVD, no carotid bruits  Cardiovascular: Regular rate and rhythm, no murmurs appreciated. Edema bilaterally lower extremities. Pulmonary/Chest: Clear to auscultation bilaterally, no wheezes or rales Abdominal: Soft.  no distension.  no tenderness.  Musculoskeletal: Normal range of motion Neurological:  normal muscle tone. Coordination normal. No atrophy Skin: Skin warm and dry Psychiatric: normal affect, pleasant    Recent Labs: 12/18/2017: ALT 18 08/14/2018: BUN 26; Creatinine, Ser 1.96; Hemoglobin 14.4; Platelets 184; Potassium 3.6; Sodium 141  Lipid Panel Lab Results  Component Value Date   CHOL 91 12/18/2017   HDL 23.80 (L) 12/18/2017   LDLCALC 45 12/18/2017   TRIG 111.0 12/18/2017      Wt Readings from Last 3 Encounters:  09/16/18 262 lb (118.8 kg)  08/21/18 270 lb 4 oz (122.6 kg)  08/14/18 263 lb (119.3 kg)       ASSESSMENT AND PLAN:  Atherosclerosis of native coronary artery of native heart without angina pectoris  Plan: Chronic stable angina Chronic shortness of breath secondary to underlying lung disease Recommend he walk around the house with oxygen not without Off oxygen he reports saturations into the 80s -Would be very cautious with proceeding with cardiac catheterization given underlying renal dysfunction  Essential hypertension, benign  Blood pressure stable, no changes made to his medications  Chronic diastolic CHF (congestive heart failure) (HCC)  Possibly has fluid retention as demonstrated by worsening shortness of breath and leg edema Suggested he could do several days of torsemide 40 twice daily with potassium twice daily then back to torsemide 40 daily  Shortness of breath Multifactorial including underlying lung disease, morbid obesity, deconditioning, cardiac disease, diastolic heart failure  Mixed hyperlipidemia Continue Zetia  Lipitor Goal LDL less than  70 Stable  Renal artery stenosis, native ([place]) followed by Dr. Lucky Cowboy Blood pressure stable  Chronic edema Suggested he continue lymphedema compression pumps, compression hose Likely exacerbated by obesity Extra Lasix as detailed above  Morbid obesity ([place]) Difficult time losing weight Unable to exercise. Recommended low carbohydrate diet Has had difficulty  PULMONARY FIBROSIS On and off oxygen Followed by pulmonary Hypoxia without oxygen walking around his house into the 80s May have got the point where he needs oxygen all of the time  Type 2 diabetes mellitus with chronic kidney disease, with long-term current use of insulin, unspecified CKD stage (HCC) Prior history of poorly controlled diabetes Hemoglobin A1c in the past has been greater than 8 Stressed importance of aggressive control  Chronic kidney disease, stage III (moderate) Followed by Dr. Holley Raring   Total encounter time more than 25 minutes  Greater than 50% was spent in counseling and coordination of care with the patient   Disposition:   F/U  6 months   No orders of the defined types were placed in this encounter.  I, Jesus Reyes am acting as a Education administrator for Ida Rogue, M.D., Ph.D.  I, Ida Rogue, M.D. Ph.D., have reviewed the above documentation for accuracy and completeness, and I agree with the above.   Signed, Esmond Plants, M.D., Ph.D. 09/16/2018  [place], [place]

## 2018-09-16 NOTE — Patient Instructions (Addendum)
Medication Instructions:   Try extra torsemide 1-2 pills sparingly as needed for shortness of breath  Please start potassium 10 meq daily Take extra potassium when you take extra torsemide  If you need a refill on your cardiac medications before your next appointment, please call your pharmacy.    Lab work: No new labs needed   If you have labs (blood work) drawn today and your tests are completely normal, you will receive your results only by: Marland Kitchen MyChart Message (if you have MyChart) OR . A paper copy in the mail If you have any lab test that is abnormal or we need to change your treatment, we will call you to review the results.   Testing/Procedures: No new testing needed   Follow-Up: At [facility], you and your health needs are our priority.  As part of our continuing mission to provide you with exceptional heart care, we have created designated Provider Care Teams.  These Care Teams include your primary Cardiologist (physician) and Advanced Practice Providers (APPs -  Physician Assistants and Nurse Practitioners) who all work together to provide you with the care you need, when you need it.  . You will need a follow up appointment in 6 months .   Please call our office 2 months in advance to schedule this appointment.    . Providers on your designated Care Team:   . Murray Hodgkins, NP . Christell Faith, PA-C . Marrianne Mood, PA-C  Any Other Special Instructions Will Be Listed Below (If Applicable).  For educational health videos Log in to : [email or web address] Or : [email or web address], password : [email or web address]

## 2018-09-17 ENCOUNTER — Other Ambulatory Visit: Payer: Self-pay

## 2018-09-17 MED ORDER — POTASSIUM CHLORIDE ER 10 MEQ PO TBCR: 10.0000 meq | EXTENDED_RELEASE_TABLET | Freq: Every day | ORAL | 3 refills | Status: DC

## 2018-09-20 ENCOUNTER — Emergency Department: Payer: Medicare HMO

## 2018-09-20 ENCOUNTER — Telehealth: Payer: Self-pay

## 2018-09-20 ENCOUNTER — Inpatient Hospital Stay
Admission: EM | Admit: 2018-09-20 | Discharge: 2018-09-22 | DRG: 312 | Disposition: A | Discharge status: Home / Self Care | Payer: Medicare HMO | Attending: Internal Medicine | Admitting: Internal Medicine

## 2018-09-20 ENCOUNTER — Other Ambulatory Visit: Payer: Self-pay

## 2018-09-20 ENCOUNTER — Encounter: Payer: Self-pay | Admitting: *Deleted

## 2018-09-20 ENCOUNTER — Inpatient Hospital Stay: Payer: Medicare HMO

## 2018-09-20 DIAGNOSIS — I951 Orthostatic hypotension: Secondary | ICD-10-CM | POA: Diagnosis not present

## 2018-09-20 DIAGNOSIS — Z833 Family history of diabetes mellitus: Secondary | ICD-10-CM | POA: Diagnosis not present

## 2018-09-20 DIAGNOSIS — I2510 Atherosclerotic heart disease of native coronary artery without angina pectoris: Secondary | ICD-10-CM | POA: Diagnosis present

## 2018-09-20 DIAGNOSIS — I130 Hypertensive heart and chronic kidney disease with heart failure and stage 1 through stage 4 chronic kidney disease, or unspecified chronic kidney disease: Secondary | ICD-10-CM | POA: Diagnosis not present

## 2018-09-20 DIAGNOSIS — J849 Interstitial pulmonary disease, unspecified: Secondary | ICD-10-CM | POA: Diagnosis not present

## 2018-09-20 DIAGNOSIS — S0993XA Unspecified injury of face, initial encounter: Secondary | ICD-10-CM | POA: Diagnosis not present

## 2018-09-20 DIAGNOSIS — Z825 Family history of asthma and other chronic lower respiratory diseases: Secondary | ICD-10-CM | POA: Diagnosis not present

## 2018-09-20 DIAGNOSIS — J9621 Acute and chronic respiratory failure with hypoxia: Secondary | ICD-10-CM

## 2018-09-20 DIAGNOSIS — S199XXA Unspecified injury of neck, initial encounter: Secondary | ICD-10-CM | POA: Diagnosis not present

## 2018-09-20 DIAGNOSIS — I272 Pulmonary hypertension, unspecified: Secondary | ICD-10-CM | POA: Diagnosis not present

## 2018-09-20 DIAGNOSIS — E785 Hyperlipidemia, unspecified: Secondary | ICD-10-CM | POA: Diagnosis present

## 2018-09-20 DIAGNOSIS — I25118 Atherosclerotic heart disease of native coronary artery with other forms of angina pectoris: Secondary | ICD-10-CM

## 2018-09-20 DIAGNOSIS — R55 Syncope and collapse: Secondary | ICD-10-CM

## 2018-09-20 DIAGNOSIS — I5033 Acute on chronic diastolic (congestive) heart failure: Secondary | ICD-10-CM | POA: Diagnosis not present

## 2018-09-20 DIAGNOSIS — E1122 Type 2 diabetes mellitus with diabetic chronic kidney disease: Secondary | ICD-10-CM | POA: Diagnosis present

## 2018-09-20 DIAGNOSIS — N400 Enlarged prostate without lower urinary tract symptoms: Secondary | ICD-10-CM | POA: Diagnosis present

## 2018-09-20 DIAGNOSIS — J8410 Pulmonary fibrosis, unspecified: Secondary | ICD-10-CM | POA: Diagnosis not present

## 2018-09-20 DIAGNOSIS — Z6841 Body Mass Index (BMI) 40.0 and over, adult: Secondary | ICD-10-CM

## 2018-09-20 DIAGNOSIS — Z955 Presence of coronary angioplasty implant and graft: Secondary | ICD-10-CM

## 2018-09-20 DIAGNOSIS — Z7902 Long term (current) use of antithrombotics/antiplatelets: Secondary | ICD-10-CM | POA: Diagnosis not present

## 2018-09-20 DIAGNOSIS — Z803 Family history of malignant neoplasm of breast: Secondary | ICD-10-CM

## 2018-09-20 DIAGNOSIS — E1140 Type 2 diabetes mellitus with diabetic neuropathy, unspecified: Secondary | ICD-10-CM | POA: Diagnosis present

## 2018-09-20 DIAGNOSIS — Z794 Long term (current) use of insulin: Secondary | ICD-10-CM

## 2018-09-20 DIAGNOSIS — Z9981 Dependence on supplemental oxygen: Secondary | ICD-10-CM | POA: Diagnosis not present

## 2018-09-20 DIAGNOSIS — M1990 Unspecified osteoarthritis, unspecified site: Secondary | ICD-10-CM | POA: Diagnosis present

## 2018-09-20 DIAGNOSIS — Z7982 Long term (current) use of aspirin: Secondary | ICD-10-CM

## 2018-09-20 DIAGNOSIS — I252 Old myocardial infarction: Secondary | ICD-10-CM | POA: Diagnosis not present

## 2018-09-20 DIAGNOSIS — W19XXXA Unspecified fall, initial encounter: Secondary | ICD-10-CM | POA: Diagnosis present

## 2018-09-20 DIAGNOSIS — R0789 Other chest pain: Secondary | ICD-10-CM | POA: Diagnosis not present

## 2018-09-20 DIAGNOSIS — I701 Atherosclerosis of renal artery: Secondary | ICD-10-CM | POA: Diagnosis present

## 2018-09-20 DIAGNOSIS — I639 Cerebral infarction, unspecified: Secondary | ICD-10-CM

## 2018-09-20 DIAGNOSIS — Z888 Allergy status to other drugs, medicaments and biological substances status: Secondary | ICD-10-CM

## 2018-09-20 DIAGNOSIS — G4733 Obstructive sleep apnea (adult) (pediatric): Secondary | ICD-10-CM | POA: Diagnosis present

## 2018-09-20 DIAGNOSIS — I509 Heart failure, unspecified: Secondary | ICD-10-CM

## 2018-09-20 DIAGNOSIS — Z8261 Family history of arthritis: Secondary | ICD-10-CM

## 2018-09-20 DIAGNOSIS — K219 Gastro-esophageal reflux disease without esophagitis: Secondary | ICD-10-CM | POA: Diagnosis present

## 2018-09-20 DIAGNOSIS — I5032 Chronic diastolic (congestive) heart failure: Secondary | ICD-10-CM | POA: Diagnosis not present

## 2018-09-20 DIAGNOSIS — S0990XA Unspecified injury of head, initial encounter: Secondary | ICD-10-CM | POA: Diagnosis not present

## 2018-09-20 DIAGNOSIS — Z9119 Patient's noncompliance with other medical treatment and regimen: Secondary | ICD-10-CM

## 2018-09-20 DIAGNOSIS — I361 Nonrheumatic tricuspid (valve) insufficiency: Secondary | ICD-10-CM | POA: Diagnosis not present

## 2018-09-20 DIAGNOSIS — E119 Type 2 diabetes mellitus without complications: Secondary | ICD-10-CM | POA: Diagnosis not present

## 2018-09-20 DIAGNOSIS — Z8249 Family history of ischemic heart disease and other diseases of the circulatory system: Secondary | ICD-10-CM | POA: Diagnosis not present

## 2018-09-20 DIAGNOSIS — N183 Chronic kidney disease, stage 3 (moderate): Secondary | ICD-10-CM | POA: Diagnosis present

## 2018-09-20 DIAGNOSIS — S0502XA Injury of conjunctiva and corneal abrasion without foreign body, left eye, initial encounter: Secondary | ICD-10-CM | POA: Diagnosis present

## 2018-09-20 DIAGNOSIS — I6522 Occlusion and stenosis of left carotid artery: Secondary | ICD-10-CM | POA: Diagnosis not present

## 2018-09-20 DIAGNOSIS — Z85828 Personal history of other malignant neoplasm of skin: Secondary | ICD-10-CM | POA: Diagnosis not present

## 2018-09-20 DIAGNOSIS — S299XXA Unspecified injury of thorax, initial encounter: Secondary | ICD-10-CM | POA: Diagnosis not present

## 2018-09-20 DIAGNOSIS — N179 Acute kidney failure, unspecified: Secondary | ICD-10-CM | POA: Diagnosis present

## 2018-09-20 DIAGNOSIS — E860 Dehydration: Secondary | ICD-10-CM | POA: Diagnosis present

## 2018-09-20 LAB — COMPREHENSIVE METABOLIC PANEL
ALT: 26 U/L (ref 0–44)
AST: 31 U/L (ref 15–41)
Albumin: 2.7 g/dL — ABNORMAL LOW (ref 3.5–5.0)
Alkaline Phosphatase: 81 U/L (ref 38–126)
Anion gap: 5 (ref 5–15)
BUN: 25 mg/dL — ABNORMAL HIGH (ref 8–23)
CO2: 28 mmol/L (ref 22–32)
Calcium: 8.4 mg/dL — ABNORMAL LOW (ref 8.9–10.3)
Chloride: 108 mmol/L (ref 98–111)
Creatinine, Ser: 1.89 mg/dL — ABNORMAL HIGH (ref 0.61–1.24)
GFR calc Af Amer: 41 mL/min — ABNORMAL LOW (ref >60)
GFR calc non Af Amer: 35 mL/min — ABNORMAL LOW (ref >60)
Glucose, Bld: 171 mg/dL — ABNORMAL HIGH (ref 70–99)
Potassium: 3.7 mmol/L (ref 3.5–5.1)
Sodium: 141 mmol/L (ref 135–145)
Total Bilirubin: 0.6 mg/dL (ref 0.3–1.2)
Total Protein: 6.5 g/dL (ref 6.5–8.1)

## 2018-09-20 LAB — URINALYSIS, COMPLETE (UACMP) WITH MICROSCOPIC
Bacteria, UA: NONE SEEN
Bilirubin Urine: NEGATIVE
Glucose, UA: 50 mg/dL — AB
Ketones, ur: NEGATIVE mg/dL
Leukocytes,Ua: NEGATIVE
Nitrite: NEGATIVE
Protein, ur: 100 mg/dL — AB
Specific Gravity, Urine: 1.017 (ref 1.005–1.030)
pH: 5 (ref 5.0–8.0)

## 2018-09-20 LAB — GLUCOSE, CAPILLARY
Glucose-Capillary: 132 mg/dL — ABNORMAL HIGH (ref 70–99)
Glucose-Capillary: 197 mg/dL — ABNORMAL HIGH (ref 70–99)
Glucose-Capillary: 277 mg/dL — ABNORMAL HIGH (ref 70–99)

## 2018-09-20 LAB — HEMOGLOBIN A1C
Hgb A1c MFr Bld: 8.4 % — ABNORMAL HIGH (ref 4.8–5.6)
Mean Plasma Glucose: 194.38 mg/dL

## 2018-09-20 LAB — BRAIN NATRIURETIC PEPTIDE: B Natriuretic Peptide: 149 pg/mL — ABNORMAL HIGH (ref 0.0–100.0)

## 2018-09-20 LAB — CBC WITH DIFFERENTIAL/PLATELET
Abs Immature Granulocytes: 0.09 10*3/uL — ABNORMAL HIGH (ref 0.00–0.07)
Basophils Absolute: 0.1 10*3/uL (ref 0.0–0.1)
Basophils Relative: 1 %
Eosinophils Absolute: 0.1 10*3/uL (ref 0.0–0.5)
Eosinophils Relative: 1 %
HCT: 46.3 % (ref 39.0–52.0)
Hemoglobin: 15 g/dL (ref 13.0–17.0)
Immature Granulocytes: 1 %
Lymphocytes Relative: 6 %
Lymphs Abs: 0.8 10*3/uL (ref 0.7–4.0)
MCH: 28.5 pg (ref 26.0–34.0)
MCHC: 32.4 g/dL (ref 30.0–36.0)
MCV: 87.9 fL (ref 80.0–100.0)
Monocytes Absolute: 0.8 10*3/uL (ref 0.1–1.0)
Monocytes Relative: 6 %
Neutro Abs: 12 10*3/uL — ABNORMAL HIGH (ref 1.7–7.7)
Neutrophils Relative %: 85 %
Platelets: 195 10*3/uL (ref 150–400)
RBC: 5.27 MIL/uL (ref 4.22–5.81)
RDW: 14.7 % (ref 11.5–15.5)
WBC: 13.8 10*3/uL — ABNORMAL HIGH (ref 4.0–10.5)
nRBC: 0 % (ref 0.0–0.2)

## 2018-09-20 LAB — TROPONIN I
Troponin I: 0.04 ng/mL (ref <0.03)
Troponin I: 0.06 ng/mL (ref <0.03)

## 2018-09-20 LAB — TSH: TSH: 5.706 u[IU]/mL — ABNORMAL HIGH (ref 0.350–4.500)

## 2018-09-20 MED ORDER — ACETAMINOPHEN 650 MG RE SUPP: 650.0000 mg | Freq: Four times a day (QID) | RECTAL | Status: DC | PRN

## 2018-09-20 MED ORDER — ERYTHROMYCIN 5 MG/GM OP OINT
TOPICAL_OINTMENT | Freq: Four times a day (QID) | OPHTHALMIC | Status: DC
  Administered 2018-09-20 – 2018-09-22 (×9): 1 via OPHTHALMIC
  Filled 2018-09-20 (×2): qty 1

## 2018-09-20 MED ORDER — CLONIDINE HCL 0.1 MG PO TABS
0.3000 mg | ORAL_TABLET | Freq: Three times a day (TID) | ORAL | Status: DC
  Administered 2018-09-20 – 2018-09-22 (×7): 0.3 mg via ORAL
  Filled 2018-09-20 (×7): qty 3

## 2018-09-20 MED ORDER — SODIUM CHLORIDE 0.9 % IV SOLN: 250.0000 mL | INTRAVENOUS | Status: DC | PRN

## 2018-09-20 MED ORDER — EZETIMIBE 10 MG PO TABS
10.0000 mg | ORAL_TABLET | Freq: Every day | ORAL | Status: DC
  Administered 2018-09-20 – 2018-09-22 (×3): 10 mg via ORAL
  Filled 2018-09-20 (×3): qty 1

## 2018-09-20 MED ORDER — ACETAMINOPHEN 500 MG PO TABS
1000.0000 mg | ORAL_TABLET | Freq: Once | ORAL | Status: AC
  Administered 2018-09-20: 1000 mg via ORAL
  Filled 2018-09-20: qty 2

## 2018-09-20 MED ORDER — TAMSULOSIN HCL 0.4 MG PO CAPS
0.4000 mg | ORAL_CAPSULE | Freq: Every day | ORAL | Status: DC
  Administered 2018-09-20 – 2018-09-21 (×2): 0.4 mg via ORAL
  Filled 2018-09-20 (×2): qty 1

## 2018-09-20 MED ORDER — TETRACAINE HCL 0.5 % OP SOLN
2.0000 [drp] | Freq: Once | OPHTHALMIC | Status: AC
  Administered 2018-09-20: 2 [drp] via OPHTHALMIC
  Filled 2018-09-20: qty 4

## 2018-09-20 MED ORDER — ONDANSETRON HCL 4 MG PO TABS: 4.0000 mg | ORAL_TABLET | Freq: Four times a day (QID) | ORAL | Status: DC | PRN

## 2018-09-20 MED ORDER — FUROSEMIDE 10 MG/ML IJ SOLN
40.0000 mg | Freq: Once | INTRAMUSCULAR | Status: AC
  Administered 2018-09-20: 40 mg via INTRAVENOUS
  Filled 2018-09-20: qty 4

## 2018-09-20 MED ORDER — ALBUTEROL SULFATE (2.5 MG/3ML) 0.083% IN NEBU: 2.5000 mg | INHALATION_SOLUTION | Freq: Four times a day (QID) | RESPIRATORY_TRACT | Status: DC | PRN

## 2018-09-20 MED ORDER — GABAPENTIN 400 MG PO CAPS
800.0000 mg | ORAL_CAPSULE | Freq: Every morning | ORAL | Status: DC
  Administered 2018-09-20 – 2018-09-22 (×3): 800 mg via ORAL
  Filled 2018-09-20 (×3): qty 2

## 2018-09-20 MED ORDER — INSULIN ASPART 100 UNIT/ML ~~LOC~~ SOLN
0.0000 [IU] | Freq: Three times a day (TID) | SUBCUTANEOUS | Status: DC
  Administered 2018-09-20: 5 [IU] via SUBCUTANEOUS
  Administered 2018-09-21: 9 [IU] via SUBCUTANEOUS
  Administered 2018-09-21 (×2): 7 [IU] via SUBCUTANEOUS
  Administered 2018-09-22: 9 [IU] via SUBCUTANEOUS
  Filled 2018-09-20 (×5): qty 1

## 2018-09-20 MED ORDER — GABAPENTIN 800 MG PO TABS: 800.0000 mg | ORAL_TABLET | ORAL | Status: DC

## 2018-09-20 MED ORDER — FUROSEMIDE 10 MG/ML IJ SOLN: 40.0000 mg | Freq: Two times a day (BID) | INTRAMUSCULAR | Status: DC

## 2018-09-20 MED ORDER — INSULIN ASPART PROT & ASPART (70-30 MIX) 100 UNIT/ML ~~LOC~~ SUSP: 50.0000 [IU] | Freq: Two times a day (BID) | SUBCUTANEOUS | Status: DC

## 2018-09-20 MED ORDER — FUROSEMIDE 10 MG/ML IJ SOLN
60.0000 mg | Freq: Once | INTRAMUSCULAR | Status: AC
  Administered 2018-09-20: 60 mg via INTRAVENOUS
  Filled 2018-09-20: qty 8

## 2018-09-20 MED ORDER — GABAPENTIN 600 MG PO TABS
1200.0000 mg | ORAL_TABLET | Freq: Every day | ORAL | Status: DC
  Administered 2018-09-20 – 2018-09-21 (×2): 1200 mg via ORAL
  Filled 2018-09-20 (×2): qty 2

## 2018-09-20 MED ORDER — ONDANSETRON HCL 4 MG/2ML IJ SOLN: 4.0000 mg | Freq: Four times a day (QID) | INTRAMUSCULAR | Status: DC | PRN

## 2018-09-20 MED ORDER — POTASSIUM CHLORIDE CRYS ER 10 MEQ PO TBCR
10.0000 meq | EXTENDED_RELEASE_TABLET | Freq: Every day | ORAL | Status: DC
  Administered 2018-09-20 – 2018-09-22 (×3): 10 meq via ORAL
  Filled 2018-09-20 (×4): qty 1

## 2018-09-20 MED ORDER — INSULIN ASPART PROT & ASPART (70-30 MIX) 100 UNIT/ML ~~LOC~~ SUSP
35.0000 [IU] | Freq: Two times a day (BID) | SUBCUTANEOUS | Status: DC
  Administered 2018-09-20 – 2018-09-21 (×3): 35 [IU] via SUBCUTANEOUS
  Filled 2018-09-20 (×3): qty 10

## 2018-09-20 MED ORDER — ALBUTEROL SULFATE HFA 108 (90 BASE) MCG/ACT IN AERS: 2.0000 | INHALATION_SPRAY | Freq: Four times a day (QID) | RESPIRATORY_TRACT | Status: DC | PRN

## 2018-09-20 MED ORDER — ACETAMINOPHEN 325 MG PO TABS: 650.0000 mg | ORAL_TABLET | Freq: Four times a day (QID) | ORAL | Status: DC | PRN

## 2018-09-20 MED ORDER — CARVEDILOL 25 MG PO TABS
25.0000 mg | ORAL_TABLET | Freq: Two times a day (BID) | ORAL | Status: DC
  Administered 2018-09-20 – 2018-09-21 (×3): 25 mg via ORAL
  Filled 2018-09-20 (×3): qty 1

## 2018-09-20 MED ORDER — POLYETHYLENE GLYCOL 3350 17 G PO PACK: 17.0000 g | PACK | Freq: Every day | ORAL | Status: DC | PRN

## 2018-09-20 MED ORDER — INSULIN ASPART 100 UNIT/ML ~~LOC~~ SOLN
0.0000 [IU] | Freq: Three times a day (TID) | SUBCUTANEOUS | Status: DC
  Administered 2018-09-20: 2 [IU] via SUBCUTANEOUS
  Filled 2018-09-20: qty 1

## 2018-09-20 MED ORDER — PANTOPRAZOLE SODIUM 40 MG PO TBEC
40.0000 mg | DELAYED_RELEASE_TABLET | Freq: Every day | ORAL | Status: DC
  Administered 2018-09-20 – 2018-09-22 (×3): 40 mg via ORAL
  Filled 2018-09-20 (×3): qty 1

## 2018-09-20 MED ORDER — ENOXAPARIN SODIUM 40 MG/0.4ML ~~LOC~~ SOLN
40.0000 mg | SUBCUTANEOUS | Status: DC
  Administered 2018-09-20: 40 mg via SUBCUTANEOUS
  Filled 2018-09-20: qty 0.4

## 2018-09-20 MED ORDER — FLUORESCEIN SODIUM 1 MG OP STRP
1.0000 | ORAL_STRIP | Freq: Once | OPHTHALMIC | Status: AC
  Administered 2018-09-20: 1 via OPHTHALMIC
  Filled 2018-09-20: qty 1

## 2018-09-20 MED ORDER — CLOPIDOGREL BISULFATE 75 MG PO TABS
75.0000 mg | ORAL_TABLET | Freq: Every day | ORAL | Status: DC
  Administered 2018-09-20 – 2018-09-22 (×3): 75 mg via ORAL
  Filled 2018-09-20 (×3): qty 1

## 2018-09-20 MED ORDER — DOCUSATE SODIUM 100 MG PO CAPS: 100.0000 mg | ORAL_CAPSULE | Freq: Every day | ORAL | Status: DC | PRN

## 2018-09-20 MED ORDER — AMLODIPINE BESYLATE 5 MG PO TABS
5.0000 mg | ORAL_TABLET | Freq: Every day | ORAL | Status: DC
  Administered 2018-09-20 – 2018-09-22 (×3): 5 mg via ORAL
  Filled 2018-09-20 (×3): qty 1

## 2018-09-20 MED ORDER — METHYLPREDNISOLONE SODIUM SUCC 125 MG IJ SOLR
125.0000 mg | Freq: Once | INTRAMUSCULAR | Status: AC
  Administered 2018-09-20: 125 mg via INTRAVENOUS
  Filled 2018-09-20: qty 2

## 2018-09-20 MED ORDER — SODIUM CHLORIDE 0.9% FLUSH
3.0000 mL | Freq: Two times a day (BID) | INTRAVENOUS | Status: DC
  Administered 2018-09-20 – 2018-09-22 (×5): 3 mL via INTRAVENOUS

## 2018-09-20 MED ORDER — DIPHENHYDRAMINE HCL 25 MG PO CAPS
50.0000 mg | ORAL_CAPSULE | Freq: Every day | ORAL | Status: DC
  Administered 2018-09-20 – 2018-09-21 (×2): 50 mg via ORAL
  Filled 2018-09-20 (×2): qty 2

## 2018-09-20 MED ORDER — ATORVASTATIN CALCIUM 80 MG PO TABS
80.0000 mg | ORAL_TABLET | Freq: Every day | ORAL | Status: DC
  Administered 2018-09-20 – 2018-09-21 (×2): 80 mg via ORAL
  Filled 2018-09-20: qty 4
  Filled 2018-09-20: qty 1
  Filled 2018-09-20: qty 4
  Filled 2018-09-20 (×2): qty 1

## 2018-09-20 MED ORDER — ISOSORBIDE MONONITRATE ER 30 MG PO TB24
90.0000 mg | ORAL_TABLET | Freq: Two times a day (BID) | ORAL | Status: DC
  Administered 2018-09-20 – 2018-09-22 (×5): 90 mg via ORAL
  Filled 2018-09-20 (×5): qty 3

## 2018-09-20 MED ORDER — ASPIRIN 81 MG PO CHEW
324.0000 mg | CHEWABLE_TABLET | Freq: Every day | ORAL | Status: DC
  Administered 2018-09-20 – 2018-09-22 (×4): 324 mg via ORAL
  Filled 2018-09-20 (×3): qty 4

## 2018-09-20 MED ORDER — HYDROCODONE-ACETAMINOPHEN 5-325 MG PO TABS
1.0000 | ORAL_TABLET | ORAL | Status: DC | PRN
  Administered 2018-09-20 – 2018-09-21 (×3): 1 via ORAL
  Filled 2018-09-20 (×3): qty 1

## 2018-09-20 NOTE — H&P (Signed)
[place] at [facility]   PATIENT NAME: Stephen [name]    MR#:  [id number]  DATE OF BIRTH:  06/03/1946  DATE OF ADMISSION:  09/20/2018  PRIMARY CARE PHYSICIAN: [name], MD   REQUESTING/REFERRING PHYSICIAN: Quentin Cornwall  CHIEF COMPLAINT:   Fall and syncope HISTORY OF PRESENT ILLNESS:  Stephen [name]  is a 73 y.o. male with a known history of CAD, chronic kidney disease stage III, diabetes, chronic diastolic heart failure who presents after a syncopal event.  Patient reports that he was walking out of the bathroom when he felt lightheaded and fell flat on his face.  Patient denies chest pain, shortness of breath, nausea or vomiting.  He has been in his usual state of health. He was brought to the ER for further evaluation. Imaging shows no acute fractures or CVA.  Patient also reports increased weight gain and lower extremity edema over the past week.  Patient was noted to have hypoxia while in the emergency room.  O2 sat was 86% on room air.  Patient is being admitted for syncopal event along with CHF exacerbation.  PAST MEDICAL HISTORY:   Past Medical History:  Diagnosis Date  . Adrenal gland anomaly   . Arthritis   . CAD (coronary artery disease)    a. 04/2006 MI and PCI/stenting to mLCx & mRCA; b. 07/2009 Cath: patent LCX/RCA stents;  c. 12/2016 NSTEMI/Cath: LM 30, LAD 30p, 48m 30d, D1/2/3 min irregs, LCX 1069mSR, OM1 min irregs, RCA 20p, 10 ISR, 7046m0d, RPDA min irregs, RPAV 40-->Med Rx.  . CKD (chronic kidney disease), stage III (HCCAmes . Colon polyps   . Diabetes mellitus with neuropathy ([place] . DM2 (diabetes mellitus, type 2) (HCC)    insulin requiring  . Dyslipidemia   . GERD (gastroesophageal reflux disease)   . History of echocardiogram    a. TTE 12/2016: EF 50-55%, mod concentric LVH, images inadequate for wall motion assessment, not technically sufficienct to allow for LV dias fxn, calcified mitral annulus   . HTN  (hypertension)   . Kidney stones   . Left Renal artery stenosis ([place]  a. 07/2013 s/p PTA/stenting ([place]).  . Myocardial infarction ([place] . OSA (obstructive sleep apnea)   . 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  . Recurrent UTI   . Rotator cuff disorder    right  . Skin cancer    head  . Thyroid disorder   . Venous insufficiency of both lower extremities    a. s/p laser treatment.    PAST SURGICAL HISTORY:   Past Surgical History:  Procedure Laterality Date  . adrenal adenoma removal  1990-   right  . CARPAL TUNNEL RELEASE     bilateral  . CATARACT EXTRACTION W/PHACO Right 07/08/2018   Procedure: CATARACT EXTRACTION PHACO AND INTRAOCULAR LENS PLACEMENT (IOC) RIGHT DIABETIC;  Surgeon: KinEulogio BearD;  [place];  Laterality: Right;  Diabetic - insulin sleep apnea  . CATARACT EXTRACTION W/PHACO Left 08/05/2018   Procedure: CATARACT EXTRACTION PHACO AND INTRAOCULAR LENS PLACEMENT (IOCOpa-lockaEFT DIABETIC;  Surgeon: KinEulogio BearD;  [place];  Laterality: Left;  DIABETIC  . CORONARY ANGIOPLASTY WITH STENT PLACEMENT  2007   x 2  . KNEE SURGERY     right  . LEFT HEART CATH AND CORONARY ANGIOGRAPHY  N/A 01/11/2017   Procedure: Left Heart Cath and Coronary Angiography;  Surgeon: Wellington Hampshire, MD;  Location: [place] [facility];  Service: Cardiovascular;  Laterality: N/A;  . LUNG BIOPSY    . RENAL ARTERY STENT  2015   L  . SKIN CANCER EXCISION     back of head    SOCIAL HISTORY:   Social History   Tobacco Use  . Smoking status: Never Smoker  . Smokeless tobacco: Never Used  Substance Use Topics  . Alcohol use: No    Alcohol/week: 0.0 standard drinks    FAMILY HISTORY:   Family History  Problem Relation Age of Onset  . Heart attack Father   . Heart attack Mother   . Heart attack  Brother   . COPD Sister   . Arthritis Maternal Grandmother   . Breast cancer Maternal Aunt   . Diabetes Maternal Aunt   . Hypertension Other   . Hypertension Maternal Aunt   . Prostate cancer Neg Hx   . Kidney cancer Neg Hx   . Bladder Cancer Neg Hx   . Colon cancer Neg Hx     DRUG ALLERGIES:   Allergies  Allergen Reactions  . Calcium Channel Blockers     Would avoid if possible due to h/o peripheral edema    REVIEW OF SYSTEMS:   Review of Systems  Constitutional: Negative.  Negative for chills, fever and malaise/fatigue.  HENT: Negative.  Negative for ear discharge, ear pain, hearing loss, nosebleeds and sore throat.   Eyes: Negative.  Negative for blurred vision and pain.  Respiratory: Positive for shortness of breath. Negative for cough, hemoptysis and wheezing.   Cardiovascular: Positive for leg swelling and PND. Negative for chest pain and palpitations.       Syncope  Gastrointestinal: Negative.  Negative for abdominal pain, blood in stool, diarrhea, nausea and vomiting.  Genitourinary: Negative.  Negative for dysuria.  Musculoskeletal: Negative.  Negative for back pain.  Skin: Negative.   Neurological: Negative for dizziness, tremors, speech change, focal weakness, seizures and headaches.  Endo/Heme/Allergies: Negative.  Does not bruise/bleed easily.  Psychiatric/Behavioral: Negative.  Negative for depression, hallucinations and suicidal ideas.    MEDICATIONS AT HOME:   Prior to Admission medications   Medication Sig Start Date End Date Taking? Authorizing Provider  ACCU-CHEK SOFTCLIX LANCETS lancets Use as instructed to check blood sugar three times daily and as needed.  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 05/16/17  Yes [name], MD  albuterol (PROVENTIL HFA;VENTOLIN HFA) 108 (90 Base) MCG/ACT inhaler Inhale 2 puffs into the lungs every 6 (six) hours as needed for wheezing or shortness of breath. 08/14/18  Yes Schaevitz, Randall An, MD  Alcohol Swabs  (B-D SINGLE USE SWABS REGULAR) PADS Use to cleans area prior to checking blood sugar three times daily and as needed.  Diagnosis:  E11.22  Insulin dependent. 05/16/17  Yes [name], MD  amLODipine (NORVASC) 5 MG tablet Take 1 tablet (5 mg total) by mouth daily. 09/16/18  Yes Minna Merritts, MD  aspirin 81 MG tablet Take 324 mg by mouth daily.    Yes [provider]  atorvastatin (LIPITOR) 80 MG tablet TAKE 1 TABLET AT BEDTIME Patient taking differently: Take 80 mg by mouth at bedtime.  07/15/18  Yes [name], MD  benzonatate (TESSALON) 100 MG capsule Take 1-2 capsules (100-200 mg total) by mouth 3 (three) times daily as needed. 08/21/18  Yes Elby Beck, FNP  Blood Glucose Monitoring  Suppl (ACCU-CHEK AVIVA PLUS) w/Device KIT Use to check blood sugar three times daily and as needed.  Diagnosis: E11.22  Insulin-dependent 05/16/17  Yes [name], MD  carvedilol (COREG) 25 MG tablet TAKE 1 TABLET TWICE DAILY WITH MEALS Patient taking differently: Take 25 mg by mouth 2 (two) times daily with a meal.  07/15/18  Yes [name], MD  cloNIDine (CATAPRES) 0.3 MG tablet Take 1 tablet (0.3 mg total) by mouth 3 (three) times daily. 09/16/18  Yes Minna Merritts, MD  clopidogrel (PLAVIX) 75 MG tablet Take 1 tablet (75 mg total) by mouth daily. 08/02/17  Yes Minna Merritts, MD  diclofenac sodium (VOLTAREN) 1 % GEL Apply 2 g topically 2 (two) times daily as needed (for left knee pain.). 03/25/18  Yes [name], MD  diphenhydrAMINE (BENADRYL) 50 MG tablet Take 50 mg by mouth at bedtime.    Yes [provider]  docusate sodium (COLACE) 100 MG capsule Take 100 mg by mouth daily as needed for mild constipation.    Yes [provider]  ezetimibe (ZETIA) 10 MG tablet Take 1 tablet (10 mg total) by mouth daily. 09/16/18 12/15/18 Yes Gollan, Kathlene November, MD  gabapentin (NEURONTIN) 800 MG tablet TAKE 1 TABLET IN THE MORNING  AND TAKE 1 AND 1/2 TABLETS AT  BEDTIME Patient taking differently: Take 800-1,200 mg by mouth See admin instructions. Take 1 tablet ('800mg'$ ) by mouth every morning and 1 tablets ('1200mg'$ ) every night at bedtime 12/30/17  Yes [name], MD  glucose blood (ACCU-CHEK AVIVA PLUS) test strip Use as instructed to check blood sugar 3 times daily or as needed.  Diagnosis:  E11.22  Insulin-dependent. 05/16/17  Yes [name], MD  insulin NPH-regular Human (NOVOLIN 70/30) (70-30) 100 UNIT/ML injection INJECT  70 UNITS SUBCUTANEOUSLY EVERY MORNING  AND  50 units at bedtime Patient taking differently: Inject 50-70 Units into the skin See admin instructions. Inject 70u under the skin every morning and 50u under the skin at bedtime 01/22/17  Yes [name], MD  Insulin Syringe-Needle U-100 (INSULIN SYRINGE 1CC/30GX5/16") 30G X 5/16" 1 ML MISC Use as directed to take insulin Dx 250.62 07/14/13  Yes Ria Bush, MD  isosorbide mononitrate (IMDUR) 60 MG 24 hr tablet Take 1.5 tablets (90 mg total) by mouth 2 (two) times daily. 09/16/18  Yes Gollan, Kathlene November, MD  nitroGLYCERIN (NITROSTAT) 0.4 MG SL tablet Place 1 tablet (0.4 mg total) under the tongue every 5 (five) minutes as needed. May repeat x3 01/18/17  Yes [name], MD  omeprazole (PRILOSEC) 20 MG capsule Take 20 mg by mouth daily.     Yes [provider]  oxyCODONE (ROXICODONE) 15 MG immediate release tablet Take 1 tablet (15 mg total) by mouth at bedtime as needed for pain. 06/24/18  Yes [name], MD  potassium chloride (K-DUR) 10 MEQ tablet Take 1 tablet (10 mEq total) by mouth daily. 09/17/18 12/16/18 Yes Gollan, Kathlene November, MD  tamsulosin (FLOMAX) 0.4 MG CAPS capsule Take 1 capsule (0.4 mg total) by mouth at bedtime. 12/21/17  Yes [name], MD  torsemide (DEMADEX) 20 MG tablet TAKE 2 TABLETS EVERY DAY Patient taking differently: Take 40 mg by mouth daily.  12/31/17  Yes Gollan, Kathlene November, MD      VITAL SIGNS:  Blood pressure (!) 107/55,  pulse (!) 54, temperature 97.6 F (36.4 C), temperature source Oral, resp. rate 17, height '5\' 5"'$  (1.651 m), weight 119.3 kg,  SpO2 97 %.  PHYSICAL EXAMINATION:   Physical Exam Constitutional:      General: He is not in acute distress. HENT:     Head: Normocephalic.  Eyes:     General: No scleral icterus. Neck:     Musculoskeletal: Normal range of motion and neck supple.     Vascular: No JVD.     Trachea: No tracheal deviation.  Cardiovascular:     Rate and Rhythm: Normal rate and regular rhythm.     Heart sounds: Normal heart sounds. No murmur. No friction rub. No gallop.   Pulmonary:     Effort: Pulmonary effort is normal. No respiratory distress.     Breath sounds: Normal breath sounds. No wheezing or rales.  Chest:     Chest wall: No tenderness.  Abdominal:     General: Bowel sounds are normal. There is no distension.     Palpations: Abdomen is soft. There is no mass.     Tenderness: There is no abdominal tenderness. There is no guarding or rebound.  Musculoskeletal: Normal range of motion.     Right lower leg: Edema present.     Left lower leg: Edema present.  Skin:    General: Skin is warm.     Findings: No erythema or rash.  Neurological:     General: No focal deficit present.     Mental Status: He is alert and oriented to person, place, and time. Mental status is at baseline.     Sensory: Sensory deficit present.  Psychiatric:        Judgment: Judgment normal.       LABORATORY PANEL:   CBC Recent Labs  Lab 09/20/18 0713  WBC 13.8*  HGB 15.0  HCT 46.3  PLT 195   ------------------------------------------------------------------------------------------------------------------  Chemistries  Recent Labs  Lab 09/20/18 0713  NA 141  K 3.7  CL 108  CO2 28  GLUCOSE 171*  BUN 25*  CREATININE 1.89*  CALCIUM 8.4*  AST 31  ALT 26  ALKPHOS 81  BILITOT 0.6    ------------------------------------------------------------------------------------------------------------------  Cardiac Enzymes Recent Labs  Lab 09/20/18 0713  TROPONINI 0.04*   ------------------------------------------------------------------------------------------------------------------  RADIOLOGY:  Dg Chest 2 View  Result Date: 09/20/2018 CLINICAL DATA:  73 year old male status post syncope and fall. Chest wall pain. EXAM: CHEST - 2 VIEW COMPARISON:  Chest radiographs 08/14/2018 and earlier. FINDINGS: Chronically increased AP dimension to the chest and elevated anterior diaphragm giving the appearance of low lung volumes on the AP view. Evidence of new small pleural effusions on the lateral. Trace fluid in the right minor fissure. No pneumothorax or pulmonary edema. Respiratory motion artifact on the lateral. No confluent pulmonary opacity. Stable cardiac size and mediastinal contours. Flowing osteophytes or syndesmophytes throughout the thoracic spine with evidence of ankylosis. No displaced rib fracture identified. Nonobstructed visible bowel gas pattern. IMPRESSION: 1. New small pleural effusion(s) since February. 2. No other acute pulmonary process or acute traumatic injury identified. Electronically Signed   By: Genevie Ann M.D.   On: 09/20/2018 08:01   Ct Head Wo Contrast  Result Date: 09/20/2018 CLINICAL DATA:  Fall.  Trauma EXAM: CT HEAD WITHOUT CONTRAST CT MAXILLOFACIAL WITHOUT CONTRAST CT CERVICAL SPINE WITHOUT CONTRAST TECHNIQUE: Multidetector CT imaging of the head, cervical spine, and maxillofacial structures were performed using the standard protocol without intravenous contrast. Multiplanar CT image reconstructions of the cervical spine and maxillofacial structures were also generated. COMPARISON:  None. FINDINGS: CT HEAD FINDINGS Brain: Mild atrophy with mild chronic microvascular ischemic  type changes in the white matter. No acute infarct, hemorrhage, mass. No fluid  collection or midline shift. Vascular: Negative for hyperdense vessel Skull: Negative for fracture Other: Negative CT MAXILLOFACIAL FINDINGS Osseous: Negative for facial fracture Orbits: Bilateral periorbital edema. Bilateral cataract surgery. No orbital mass. Sinuses: Mild mucosal edema in the paranasal sinuses without air-fluid level. Soft tissues: Negative for soft tissue mass. CT CERVICAL SPINE FINDINGS Alignment: 2 mm anterolisthesis C4-5.  Mild levoscoliosis Skull base and vertebrae: Negative for fracture Soft tissues and spinal canal: Negative for soft tissue mass. Carotid artery calcification bilaterally Disc levels: Advanced disc and facet degeneration throughout the cervical spine. Foraminal encroachment bilaterally at C3-4, C4-5, C5-6, C6-7. Foraminal narrowing is severe on the right at C3-4 C4-5 and C5-6 and C6-7. Upper chest: Negative Other: None IMPRESSION: 1. No acute intracranial abnormality. Atrophy and chronic microvascular ischemia 2. Negative for facial fracture 3. Advanced cervical spondylosis. Negative for cervical spine fracture. Electronically Signed   By: Franchot Gallo M.D.   On: 09/20/2018 08:20   Ct Chest Wo Contrast  Result Date: 09/20/2018 CLINICAL DATA:  73 year old male with history of trauma from a fall. Injury to chest. EXAM: CT CHEST WITHOUT CONTRAST TECHNIQUE: Multidetector CT imaging of the chest was performed following the standard protocol without IV contrast. COMPARISON:  No priors. FINDINGS: Cardiovascular: Heart size is mildly enlarged. There is no significant pericardial fluid, thickening or pericardial calcification. There is aortic atherosclerosis, as well as atherosclerosis of the great vessels of the mediastinum and the coronary arteries, including calcified atherosclerotic plaque in the left main, left anterior descending, left circumflex and right coronary arteries. Mild calcification of the aortic valve. Moderate calcification of the mitral annulus.  Mediastinum/Nodes: No pathologically enlarged mediastinal or hilar lymph nodes. Please note that accurate exclusion of hilar adenopathy is limited on noncontrast CT scans. Esophagus is unremarkable in appearance. No axillary lymphadenopathy. Lungs/Pleura: No pneumothorax. Elevation of the right hemidiaphragm, which appears similar to prior CT the abdomen and pelvis 11/23/2016. Trace bilateral pleural effusions lying dependently. Areas of linear scarring are noted throughout the lung bases bilaterally, similar to the prior study. In addition, there are areas of ground-glass attenuation, septal thickening, thickening of the peribronchovascular interstitium and regional areas of architectural distortion scattered throughout the lung bases bilaterally, concerning for interstitial lung disease. No frank honeycombing noted. No acute consolidative airspace disease. No pleural effusions. No suspicious appearing pulmonary nodules or masses are noted. Upper Abdomen: Well-defined 3.2 x 3.2 cm low-attenuation (8 HU) left adrenal nodule, similar to the prior examination, compatible with an adenoma. Surgical clips in the upper right retroperitoneum. Musculoskeletal: There are no aggressive appearing lytic or blastic lesions noted in the visualized portions of the skeleton. IMPRESSION: 1. No evidence of significant acute traumatic injury to the thorax. 2. There is a spectrum of findings in the lungs concerning for interstitial lung disease, considered indeterminate for usual interstitial pneumonia (UIP) per current ATS guidelines. Outpatient referral to Pulmonology for further evaluation is suggested in the near future. Additionally, follow-up high-resolution chest CT is recommended in 12 months to assess for temporal changes in the appearance of the lung parenchyma. 3. Trace bilateral pleural effusions lying dependently. 4. Aortic atherosclerosis, in addition to left main and 3 vessel coronary artery disease. Please note that  although the presence of coronary artery calcium documents the presence of coronary artery disease, the severity of this disease and any potential stenosis cannot be assessed on this non-gated CT examination. Assessment for potential risk factor modification, dietary therapy  or pharmacologic therapy may be warranted, if clinically indicated. 5. There are calcifications of the aortic valve and mitral annulus. Echocardiographic correlation for evaluation of potential valvular dysfunction may be warranted if clinically indicated. 6. Mild cardiomegaly. Aortic Atherosclerosis (ICD10-I70.0). Electronically Signed   By: Vinnie Langton M.D.   On: 09/20/2018 08:54   Ct Cervical Spine Wo Contrast  Result Date: 09/20/2018 CLINICAL DATA:  Fall.  Trauma EXAM: CT HEAD WITHOUT CONTRAST CT MAXILLOFACIAL WITHOUT CONTRAST CT CERVICAL SPINE WITHOUT CONTRAST TECHNIQUE: Multidetector CT imaging of the head, cervical spine, and maxillofacial structures were performed using the standard protocol without intravenous contrast. Multiplanar CT image reconstructions of the cervical spine and maxillofacial structures were also generated. COMPARISON:  None. FINDINGS: CT HEAD FINDINGS Brain: Mild atrophy with mild chronic microvascular ischemic type changes in the white matter. No acute infarct, hemorrhage, mass. No fluid collection or midline shift. Vascular: Negative for hyperdense vessel Skull: Negative for fracture Other: Negative CT MAXILLOFACIAL FINDINGS Osseous: Negative for facial fracture Orbits: Bilateral periorbital edema. Bilateral cataract surgery. No orbital mass. Sinuses: Mild mucosal edema in the paranasal sinuses without air-fluid level. Soft tissues: Negative for soft tissue mass. CT CERVICAL SPINE FINDINGS Alignment: 2 mm anterolisthesis C4-5.  Mild levoscoliosis Skull base and vertebrae: Negative for fracture Soft tissues and spinal canal: Negative for soft tissue mass. Carotid artery calcification bilaterally Disc  levels: Advanced disc and facet degeneration throughout the cervical spine. Foraminal encroachment bilaterally at C3-4, C4-5, C5-6, C6-7. Foraminal narrowing is severe on the right at C3-4 C4-5 and C5-6 and C6-7. Upper chest: Negative Other: None IMPRESSION: 1. No acute intracranial abnormality. Atrophy and chronic microvascular ischemia 2. Negative for facial fracture 3. Advanced cervical spondylosis. Negative for cervical spine fracture. Electronically Signed   By: Franchot Gallo M.D.   On: 09/20/2018 08:20   Ct Maxillofacial Wo Contrast  Result Date: 09/20/2018 CLINICAL DATA:  Fall.  Trauma EXAM: CT HEAD WITHOUT CONTRAST CT MAXILLOFACIAL WITHOUT CONTRAST CT CERVICAL SPINE WITHOUT CONTRAST TECHNIQUE: Multidetector CT imaging of the head, cervical spine, and maxillofacial structures were performed using the standard protocol without intravenous contrast. Multiplanar CT image reconstructions of the cervical spine and maxillofacial structures were also generated. COMPARISON:  None. FINDINGS: CT HEAD FINDINGS Brain: Mild atrophy with mild chronic microvascular ischemic type changes in the white matter. No acute infarct, hemorrhage, mass. No fluid collection or midline shift. Vascular: Negative for hyperdense vessel Skull: Negative for fracture Other: Negative CT MAXILLOFACIAL FINDINGS Osseous: Negative for facial fracture Orbits: Bilateral periorbital edema. Bilateral cataract surgery. No orbital mass. Sinuses: Mild mucosal edema in the paranasal sinuses without air-fluid level. Soft tissues: Negative for soft tissue mass. CT CERVICAL SPINE FINDINGS Alignment: 2 mm anterolisthesis C4-5.  Mild levoscoliosis Skull base and vertebrae: Negative for fracture Soft tissues and spinal canal: Negative for soft tissue mass. Carotid artery calcification bilaterally Disc levels: Advanced disc and facet degeneration throughout the cervical spine. Foraminal encroachment bilaterally at C3-4, C4-5, C5-6, C6-7. Foraminal narrowing  is severe on the right at C3-4 C4-5 and C5-6 and C6-7. Upper chest: Negative Other: None IMPRESSION: 1. No acute intracranial abnormality. Atrophy and chronic microvascular ischemia 2. Negative for facial fracture 3. Advanced cervical spondylosis. Negative for cervical spine fracture. Electronically Signed   By: Franchot Gallo M.D.   On: 09/20/2018 08:20    EKG:   No ST elevation or depression.  Heart rate 60  IMPRESSION AND PLAN:   73 year old male with history of chronic diastolic heart failure, diabetes, OSA on CPAP who  presents after syncopal event.  1.  Syncope of unclear etiology Continue to monitor troponins and telemetry Cardiology consultation placed via epic Order carotid Doppler and orthostatic vital signs.  2.  Acute on chronic diastolic heart failure: Hold torsemide for now and start Lasix 40 IV twice daily Follow intake and output with daily weight CHF clinic upon discharge [facility] cardiology consultation placed via epic  3.  Mildly elevated troponin: This is due to demand ischemia Follow telemetry and troponins. Echo if indicated by cardiology  4.  Diabetes: Continue outpatient regimen ADA diet with sliding scale  5.  OSA: CPAP at night  6.  CAD: Continue Plavix, aspirin, statin  7.  Essential hypertension: Continue Norvasc, Coreg, clonidine, isosorbide  8.  BPH: Continue Flomax    All the records are reviewed and case discussed with ED provider. Management plans discussed with the patient and wife and they are in agreement  CODE STATUS: Full  TOTAL TIME TAKING CARE OF THIS PATIENT: 44 minutes.    Bettey Costa M.D on 09/20/2018 at 11:15 AM  Between 7am to 6pm - Pager - [phone]  After 6pm go to [email or web address] - password [email or web address] [facility]  Office  [phone]  CC: Primary care physician; [name], MD

## 2018-09-20 NOTE — ED Notes (Signed)
Date and time results received: 09/20/18   Test: troponin Critical Value: 0.04  Name of Provider Notified: Quentin Cornwall  Orders Received? Or Actions Taken?: MD notified

## 2018-09-20 NOTE — Telephone Encounter (Signed)
Per EMR, he was admitted.  I would not cancel it yet, in case he needs hospital f/u early next week, assuming improvement and discharge in the meantime.    And I hope he feels better soon.  Thanks.

## 2018-09-20 NOTE — ED Triage Notes (Signed)
Pt presents after fall @ 0330. Pt struck wall w/ face and chest. Pt wears O2/[place] @ 2L/min chronucally @ home. Pt takes ASA 324mg  and plavix 75 mg daily. Pt is A&Ox4. Pt has bilateral LOWER EXTREMETY pitting 2+ edema.

## 2018-09-20 NOTE — Telephone Encounter (Signed)
Jane (DPR signed)left v/m requesting cb from [place] wanting to know if should cancel 3 mth FU appt on 09/24/18. Side note per chart review pt at [facility] ED today.

## 2018-09-20 NOTE — TOC Initial Note (Signed)
Transition of Care [facility]) - Initial/Assessment Note    Patient Details  Name: Stephen [name] MRN: [id number] Date of Birth: 16-Mar-1946  Transition of Care [facility]) CM/SW Contact:    Elza Rafter, RN Phone Number: 09/20/2018, 3:03 PM  Clinical Narrative:       Patient is from home with wife.  Hx of CHF.  Admitted with fall.  Patient receiving IV lasix.  Spouse at bedside.    Patient chonically on 2L O2 at home; uses CPAP.  Currently on 3L.  Has a functioning scale at home.  Denies difficulties obtaining medications or accessing healthcare.  Current with PCP.  Independent in all ADL's.  Does not drive due to cataract surgery.  Declines home health services. At this time.  Does not currently go to the heart failure clinic.  No further needs identified.           Expected Discharge Plan: Home/Self Care Barriers to Discharge: Continued Medical Work up   Patient Goals and CMS Choice        Expected Discharge Plan and Services Expected Discharge Plan: Home/Self Care   Discharge Planning Services: CM Consult, HF Clinic   Living arrangements for the past 2 months: Single Family Home                     HH Arranged: Patient Refused HH    Prior Living Arrangements/Services Living arrangements for the past 2 months: Single Family Home Lives with:: Spouse   Do you feel safe going back to the place where you live?: Yes        Care giver support system in place?: Yes (comment)(wife)   Criminal Activity/Legal Involvement Pertinent to Current Situation/Hospitalization: No - Comment as needed  Activities of Daily Living Home Assistive Devices/Equipment: Oxygen, CPAP ADL Screening (condition at time of admission) Is the patient deaf or have difficulty hearing?: No Does the patient have difficulty seeing, even when wearing glasses/contacts?: Yes Does the patient have difficulty concentrating, remembering, or making decisions?: No Patient able to express need for assistance with  ADLs?: Yes Does the patient have difficulty dressing or bathing?: Yes Independently performs ADLs?: Yes (appropriate for developmental age) Does the patient have difficulty walking or climbing stairs?: Yes Weakness of Legs: Both Weakness of Arms/Hands: Both  Permission Sought/Granted                  Emotional Assessment   Attitude/Demeanor/Rapport: Gracious Affect (typically observed): Accepting Orientation: : Oriented to Self, Oriented to Place, Oriented to  Time, Oriented to Situation Alcohol / Substance Use: Not Applicable    Admission diagnosis:  Cerebral infarction ([place]) [I63.9] CVA (cerebral infarction) [I63.9] Acute on chronic respiratory failure with hypoxia (H[facility]) [J96.21] Syncope, unspecified syncope type [R55] Patient Active Problem List   Diagnosis Date Noted  . CHF (congestive heart failure) ([place]) 09/20/2018  . Knee pain 03/26/2018  . Contusion of foot 02/15/2018  . Encounter for chronic pain management 12/23/2017  . Healthcare maintenance 09/21/2017  . Skin lesion 06/15/2017  . Acute on chronic renal failure ([place]) 01/15/2017  . Coronary stent occlusion: Occluded circumflex stent 01/13/2017  . Accelerated hypertension with diastolic congestive heart failure, NYHA class 3 ([place]); elevated LVEDP on chronic catheterization. 01/13/2017  . NSTEMI (non-ST elevated myocardial infarction) ([place]) 01/11/2017  . Blood in urine 12/08/2016  . Lymphedema 10/23/2016  . Low back pain with right-sided sciatica 04/03/2014  . Muscle spasm 04/24/2013  . Chronic edema 11/13/2012  . Leg edema, right 09/29/2012  .  SK (seborrheic keratosis) 09/29/2012  . Chronic diastolic CHF (congestive heart failure) ([place]) 12/15/2011  . Sebaceous cyst 11/28/2011  . Morbid obesity ([place]) 07/27/2011  . Hyperlipidemia with target low density lipoprotein (LDL) cholesterol less than 70 mg/dL 04/26/2011  . Adrenal adenoma 03/02/2011  . Back pain 12/06/2010  . Kidney stones 12/06/2010  . Essential  hypertension, benign 10/28/2010  . PULMONARY FIBROSIS 09/15/2010  . Coronary artery disease involving native coronary artery of native heart with angina pectoris ([place]) 09/01/2010  . Type 2 diabetes mellitus with renal complication ([place]) 44/07/270  . METABOLIC SYNDROME X 53/66/4403  . SLEEP APNEA, OBSTRUCTIVE 08/31/2010  . Chronic kidney disease, stage III (moderate) ([place]) 08/31/2010   PCP:  [name], MD Pharmacy:   [facility] [address] [place] [address] Phone: [phone] Fax: [phone]     Social Determinants of Health (SDOH) Interventions    Readmission Risk Interventions No flowsheet data found.

## 2018-09-20 NOTE — Progress Notes (Signed)
Admitted from the ED after a fell at home.  Found to be hypoxic in the ED.  The majority of his injuries are on his face.  He has multiple scratches - similar to being attacked by a cat.  Bruising and swelling around his eyes.  No fractures seen on CT.  No injuries required stitches.  Respiratory status was stable on 3L [place] until about 1430. His RR went up to 32. O2 sats were 84.  Increased the oxygen to 5L [place], sats increased to 88%.  Put him on NRB at 10 L.  Sats increased to 95%.  Dr. Benjie Karvonen notified. Orders for solumedrol 125 mg , and lasix 40 mg IVP.

## 2018-09-20 NOTE — ED Notes (Signed)
ED TO INPATIENT HANDOFF REPORT  ED Nurse Name and Phone #: Elliett Guarisco [phone]  S Name/Age/Gender Stephen [name] 73 y.o. male Room/Bed: ED12A/ED12A  Code Status   Code Status: Full Code  Home/SNF/Other Home Patient oriented x 4 Is this baseline? yes  Triage Complete: Triage complete  Chief Complaint fall  Triage Note Pt presents after fall @ 0330. Pt struck wall w/ face and chest. Pt wears O2/[place] @ 2L/min chronucally @ home. Pt takes ASA 324mg  and plavix 75 mg daily. Pt is A&Ox4. Pt has bilateral LOWER EXTREMETY pitting 2+ edema.    Allergies Allergies  Allergen Reactions  . Calcium Channel Blockers     Would avoid if possible due to h/o peripheral edema    Level of Care/Admitting Diagnosis ED Disposition    ED Disposition Condition [facility] Area: [place] [[id number]]  Level of Care: Telemetry [5]  Diagnosis: CHF (congestive heart failure) [facility]) [[id number]]  Admitting Physician: Honor Loh  Attending Physician: MODY, Ulice Bold [[id number]]  Estimated length of stay: past midnight tomorrow  Certification:: I certify this patient will need inpatient services for at least 2 midnights  PT Class (Do Not Modify): Inpatient [101]  PT Acc Code (Do Not Modify): Private [1]       B Medical/Surgery History Past Medical History:  Diagnosis Date  . Adrenal gland anomaly   . Arthritis   . CAD (coronary artery disease)    a. 04/2006 MI and PCI/stenting to mLCx & mRCA; b. 07/2009 Cath: patent LCX/RCA stents;  c. 12/2016 NSTEMI/Cath: LM 30, LAD 30p, 65m, 30d, D1/2/3 min irregs, LCX 138m ISR, OM1 min irregs, RCA 20p, 10 ISR, 66m, 50d, RPDA min irregs, RPAV 40-->Med Rx.  . CKD (chronic kidney disease), stage III ([place])   . Colon polyps   . Diabetes mellitus with neuropathy ([place])   . DM2 (diabetes mellitus, type 2) (HCC)    insulin requiring  . Dyslipidemia   . GERD (gastroesophageal reflux disease)   . History of echocardiogram    a. TTE  12/2016: EF 50-55%, mod concentric LVH, images inadequate for wall motion assessment, not technically sufficienct to allow for LV dias fxn, calcified mitral annulus   . HTN (hypertension)   . Kidney stones   . Left Renal artery stenosis ([place])    a. 07/2013 s/p PTA/stenting ([place]).  . Myocardial infarction ([place])   . OSA (obstructive sleep apnea)   . 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  . Recurrent UTI   . Rotator cuff disorder    right  . Skin cancer    head  . Thyroid disorder   . Venous insufficiency of both lower extremities    a. s/p laser treatment.   Past Surgical History:  Procedure Laterality Date  . adrenal adenoma removal  1990-   right  . CARPAL TUNNEL RELEASE     bilateral  . CATARACT EXTRACTION W/PHACO Right 07/08/2018   Procedure: CATARACT EXTRACTION PHACO AND INTRAOCULAR LENS PLACEMENT ([facility]) RIGHT DIABETIC;  Surgeon: Eulogio Bear, MD;  Location: [facility];  Service: Ophthalmology;  Laterality: Right;  Diabetic - insulin sleep apnea  . CATARACT EXTRACTION W/PHACO Left 08/05/2018   Procedure: CATARACT EXTRACTION PHACO AND INTRAOCULAR LENS PLACEMENT ([place]) LEFT DIABETIC;  Surgeon: Eulogio Bear, MD;  Location: [facility];  Service: Ophthalmology;  Laterality: Left;  DIABETIC  . CORONARY ANGIOPLASTY WITH STENT PLACEMENT  2007  x 2  . KNEE SURGERY     right  . LEFT HEART CATH AND CORONARY ANGIOGRAPHY N/A 01/11/2017   Procedure: Left Heart Cath and Coronary Angiography;  Surgeon: Wellington Hampshire, MD;  Location: [facility];  Service: Cardiovascular;  Laterality: N/A;  . LUNG BIOPSY    . RENAL ARTERY STENT  2015   L  . SKIN CANCER EXCISION     back of head     A IV Location/Drains/Wounds Patient Lines/Drains/Airways Status   Active Line/Drains/Airways    Name:   Placement date:   Placement time:   Site:   Days:   Peripheral IV 09/20/18 Left  Antecubital   09/20/18    0742    Antecubital   less than 1   Incision (Closed) 07/08/18 Eye Right   07/08/18    0746     74   Incision (Closed) 08/05/18 Eye Left   08/05/18    0826     46          Intake/Output Last 24 hours No intake or output data in the 24 hours ending 09/20/18 1122  Labs/Imaging Results for orders placed or performed during the hospital encounter of 09/20/18 (from the past 48 hour(s))  CBC with Differential/Platelet     Status: Abnormal   Collection Time: 09/20/18  7:13 AM  Result Value Ref Range   WBC 13.8 (H) 4.0 - 10.5 K/uL   RBC 5.27 4.22 - 5.81 MIL/uL   Hemoglobin 15.0 13.0 - 17.0 g/dL   HCT 46.3 39.0 - 52.0 %   MCV 87.9 80.0 - 100.0 fL   MCH 28.5 26.0 - 34.0 pg   MCHC 32.4 30.0 - 36.0 g/dL   RDW 14.7 11.5 - 15.5 %   Platelets 195 150 - 400 K/uL   nRBC 0.0 0.0 - 0.2 %   Neutrophils Relative % 85 %   Neutro Abs 12.0 (H) 1.7 - 7.7 K/uL   Lymphocytes Relative 6 %   Lymphs Abs 0.8 0.7 - 4.0 K/uL   Monocytes Relative 6 %   Monocytes Absolute 0.8 0.1 - 1.0 K/uL   Eosinophils Relative 1 %   Eosinophils Absolute 0.1 0.0 - 0.5 K/uL   Basophils Relative 1 %   Basophils Absolute 0.1 0.0 - 0.1 K/uL   Immature Granulocytes 1 %   Abs Immature Granulocytes 0.09 (H) 0.00 - 0.07 K/uL    Comment: Performed at [facility], [address] [place], [place] [address]  Comprehensive metabolic panel     Status: Abnormal   Collection Time: 09/20/18  7:13 AM  Result Value Ref Range   Sodium 141 135 - 145 mmol/L   Potassium 3.7 3.5 - 5.1 mmol/L   Chloride 108 98 - 111 mmol/L   CO2 28 22 - 32 mmol/L   Glucose, Bld 171 (H) 70 - 99 mg/dL   BUN 25 (H) 8 - 23 mg/dL   Creatinine, Ser 1.89 (H) 0.61 - 1.24 mg/dL   Calcium 8.4 (L) 8.9 - 10.3 mg/dL   Total Protein 6.5 6.5 - 8.1 g/dL   Albumin 2.7 (L) 3.5 - 5.0 g/dL   AST 31 15 - 41 U/L   ALT 26 0 - 44 U/L   Alkaline Phosphatase 81 38 - 126 U/L   Total Bilirubin 0.6 0.3 - 1.2 mg/dL   GFR calc non Af Amer 35  (L) >60 mL/min   GFR calc Af Amer 41 (L) >60 mL/min   Anion gap 5  5 - 15    Comment: Performed at [facility], [address] [place], [place] [address]  Troponin I - ONCE - STAT     Status: Abnormal   Collection Time: 09/20/18  7:13 AM  Result Value Ref Range   Troponin I 0.04 (HH) <0.03 ng/mL    Comment: CRITICAL RESULT CALLED TO, READ BACK BY AND VERIFIED WITH LAURIE Filip Luten AT 0804 09/20/2018 DAS Performed at [facility], [address] [place], [place] [address]   Brain natriuretic peptide     Status: Abnormal   Collection Time: 09/20/18  7:13 AM  Result Value Ref Range   B Natriuretic Peptide 149.0 (H) 0.0 - 100.0 pg/mL    Comment: Performed at [facility], [address] [place],  [address]  Urinalysis, Complete w Microscopic     Status: Abnormal   Collection Time: 09/20/18  9:42 AM  Result Value Ref Range   Color, Urine YELLOW (A) YELLOW   APPearance CLEAR (A) CLEAR   Specific Gravity, Urine 1.017 1.005 - 1.030   pH 5.0 5.0 - 8.0   Glucose, UA 50 (A) NEGATIVE mg/dL   Hgb urine dipstick SMALL (A) NEGATIVE   Bilirubin Urine NEGATIVE NEGATIVE   Ketones, ur NEGATIVE NEGATIVE mg/dL   Protein, ur 100 (A) NEGATIVE mg/dL   Nitrite NEGATIVE NEGATIVE   Leukocytes,Ua NEGATIVE NEGATIVE   RBC / HPF 0-5 0 - 5 RBC/hpf   WBC, UA 0-5 0 - 5 WBC/hpf   Bacteria, UA NONE SEEN NONE SEEN   Squamous Epithelial / LPF 0-5 0 - 5   Mucus PRESENT    Hyaline Casts, UA PRESENT     Comment: Performed at [facility], [address] [place], [place] [address]  Glucose, capillary     Status: Abnormal   Collection Time: 09/20/18 10:26 AM  Result Value Ref Range   Glucose-Capillary 132 (H) 70 - 99 mg/dL   Dg Chest 2 View  Result Date: 09/20/2018 CLINICAL DATA:  73 year old male status post syncope and fall. Chest wall pain. EXAM: CHEST - 2 VIEW COMPARISON:  Chest radiographs 08/14/2018 and earlier. FINDINGS: Chronically increased AP dimension to  the chest and elevated anterior diaphragm giving the appearance of low lung volumes on the AP view. Evidence of new small pleural effusions on the lateral. Trace fluid in the right minor fissure. No pneumothorax or pulmonary edema. Respiratory motion artifact on the lateral. No confluent pulmonary opacity. Stable cardiac size and mediastinal contours. Flowing osteophytes or syndesmophytes throughout the thoracic spine with evidence of ankylosis. No displaced rib fracture identified. Nonobstructed visible bowel gas pattern. IMPRESSION: 1. New small pleural effusion(s) since February. 2. No other acute pulmonary process or acute traumatic injury identified. Electronically Signed   By: Genevie Ann M.D.   On: 09/20/2018 08:01   Ct Head Wo Contrast  Result Date: 09/20/2018 CLINICAL DATA:  Fall.  Trauma EXAM: CT HEAD WITHOUT CONTRAST CT MAXILLOFACIAL WITHOUT CONTRAST CT CERVICAL SPINE WITHOUT CONTRAST TECHNIQUE: Multidetector CT imaging of the head, cervical spine, and maxillofacial structures were performed using the standard protocol without intravenous contrast. Multiplanar CT image reconstructions of the cervical spine and maxillofacial structures were also generated. COMPARISON:  None. FINDINGS: CT HEAD FINDINGS Brain: Mild atrophy with mild chronic microvascular ischemic type changes in the white matter. No acute infarct, hemorrhage, mass. No fluid collection or midline shift. Vascular: Negative for hyperdense vessel Skull: Negative for fracture Other: Negative CT MAXILLOFACIAL FINDINGS Osseous: Negative for facial fracture Orbits: Bilateral  periorbital edema. Bilateral cataract surgery. No orbital mass. Sinuses: Mild mucosal edema in the paranasal sinuses without air-fluid level. Soft tissues: Negative for soft tissue mass. CT CERVICAL SPINE FINDINGS Alignment: 2 mm anterolisthesis C4-5.  Mild levoscoliosis Skull base and vertebrae: Negative for fracture Soft tissues and spinal canal: Negative for soft tissue mass.  Carotid artery calcification bilaterally Disc levels: Advanced disc and facet degeneration throughout the cervical spine. Foraminal encroachment bilaterally at C3-4, C4-5, C5-6, C6-7. Foraminal narrowing is severe on the right at C3-4 C4-5 and C5-6 and C6-7. Upper chest: Negative Other: None IMPRESSION: 1. No acute intracranial abnormality. Atrophy and chronic microvascular ischemia 2. Negative for facial fracture 3. Advanced cervical spondylosis. Negative for cervical spine fracture. Electronically Signed   By: Franchot Gallo M.D.   On: 09/20/2018 08:20   Ct Chest Wo Contrast  Result Date: 09/20/2018 CLINICAL DATA:  73 year old male with history of trauma from a fall. Injury to chest. EXAM: CT CHEST WITHOUT CONTRAST TECHNIQUE: Multidetector CT imaging of the chest was performed following the standard protocol without IV contrast. COMPARISON:  No priors. FINDINGS: Cardiovascular: Heart size is mildly enlarged. There is no significant pericardial fluid, thickening or pericardial calcification. There is aortic atherosclerosis, as well as atherosclerosis of the great vessels of the mediastinum and the coronary arteries, including calcified atherosclerotic plaque in the left main, left anterior descending, left circumflex and right coronary arteries. Mild calcification of the aortic valve. Moderate calcification of the mitral annulus. Mediastinum/Nodes: No pathologically enlarged mediastinal or hilar lymph nodes. Please note that accurate exclusion of hilar adenopathy is limited on noncontrast CT scans. Esophagus is unremarkable in appearance. No axillary lymphadenopathy. Lungs/Pleura: No pneumothorax. Elevation of the right hemidiaphragm, which appears similar to prior CT the abdomen and pelvis 11/23/2016. Trace bilateral pleural effusions lying dependently. Areas of linear scarring are noted throughout the lung bases bilaterally, similar to the prior study. In addition, there are areas of ground-glass attenuation,  septal thickening, thickening of the peribronchovascular interstitium and regional areas of architectural distortion scattered throughout the lung bases bilaterally, concerning for interstitial lung disease. No frank honeycombing noted. No acute consolidative airspace disease. No pleural effusions. No suspicious appearing pulmonary nodules or masses are noted. Upper Abdomen: Well-defined 3.2 x 3.2 cm low-attenuation (8 HU) left adrenal nodule, similar to the prior examination, compatible with an adenoma. Surgical clips in the upper right retroperitoneum. Musculoskeletal: There are no aggressive appearing lytic or blastic lesions noted in the visualized portions of the skeleton. IMPRESSION: 1. No evidence of significant acute traumatic injury to the thorax. 2. There is a spectrum of findings in the lungs concerning for interstitial lung disease, considered indeterminate for usual interstitial pneumonia (UIP) per current ATS guidelines. Outpatient referral to Pulmonology for further evaluation is suggested in the near future. Additionally, follow-up high-resolution chest CT is recommended in 12 months to assess for temporal changes in the appearance of the lung parenchyma. 3. Trace bilateral pleural effusions lying dependently. 4. Aortic atherosclerosis, in addition to left main and 3 vessel coronary artery disease. Please note that although the presence of coronary artery calcium documents the presence of coronary artery disease, the severity of this disease and any potential stenosis cannot be assessed on this non-gated CT examination. Assessment for potential risk factor modification, dietary therapy or pharmacologic therapy may be warranted, if clinically indicated. 5. There are calcifications of the aortic valve and mitral annulus. Echocardiographic correlation for evaluation of potential valvular dysfunction may be warranted if clinically indicated. 6. Mild cardiomegaly. Aortic Atherosclerosis (  ICD10-I70.0).  Electronically Signed   By: Vinnie Langton M.D.   On: 09/20/2018 08:54   Ct Cervical Spine Wo Contrast  Result Date: 09/20/2018 CLINICAL DATA:  Fall.  Trauma EXAM: CT HEAD WITHOUT CONTRAST CT MAXILLOFACIAL WITHOUT CONTRAST CT CERVICAL SPINE WITHOUT CONTRAST TECHNIQUE: Multidetector CT imaging of the head, cervical spine, and maxillofacial structures were performed using the standard protocol without intravenous contrast. Multiplanar CT image reconstructions of the cervical spine and maxillofacial structures were also generated. COMPARISON:  None. FINDINGS: CT HEAD FINDINGS Brain: Mild atrophy with mild chronic microvascular ischemic type changes in the white matter. No acute infarct, hemorrhage, mass. No fluid collection or midline shift. Vascular: Negative for hyperdense vessel Skull: Negative for fracture Other: Negative CT MAXILLOFACIAL FINDINGS Osseous: Negative for facial fracture Orbits: Bilateral periorbital edema. Bilateral cataract surgery. No orbital mass. Sinuses: Mild mucosal edema in the paranasal sinuses without air-fluid level. Soft tissues: Negative for soft tissue mass. CT CERVICAL SPINE FINDINGS Alignment: 2 mm anterolisthesis C4-5.  Mild levoscoliosis Skull base and vertebrae: Negative for fracture Soft tissues and spinal canal: Negative for soft tissue mass. Carotid artery calcification bilaterally Disc levels: Advanced disc and facet degeneration throughout the cervical spine. Foraminal encroachment bilaterally at C3-4, C4-5, C5-6, C6-7. Foraminal narrowing is severe on the right at C3-4 C4-5 and C5-6 and C6-7. Upper chest: Negative Other: None IMPRESSION: 1. No acute intracranial abnormality. Atrophy and chronic microvascular ischemia 2. Negative for facial fracture 3. Advanced cervical spondylosis. Negative for cervical spine fracture. Electronically Signed   By: Franchot Gallo M.D.   On: 09/20/2018 08:20   Ct Maxillofacial Wo Contrast  Result Date: 09/20/2018 CLINICAL DATA:  Fall.   Trauma EXAM: CT HEAD WITHOUT CONTRAST CT MAXILLOFACIAL WITHOUT CONTRAST CT CERVICAL SPINE WITHOUT CONTRAST TECHNIQUE: Multidetector CT imaging of the head, cervical spine, and maxillofacial structures were performed using the standard protocol without intravenous contrast. Multiplanar CT image reconstructions of the cervical spine and maxillofacial structures were also generated. COMPARISON:  None. FINDINGS: CT HEAD FINDINGS Brain: Mild atrophy with mild chronic microvascular ischemic type changes in the white matter. No acute infarct, hemorrhage, mass. No fluid collection or midline shift. Vascular: Negative for hyperdense vessel Skull: Negative for fracture Other: Negative CT MAXILLOFACIAL FINDINGS Osseous: Negative for facial fracture Orbits: Bilateral periorbital edema. Bilateral cataract surgery. No orbital mass. Sinuses: Mild mucosal edema in the paranasal sinuses without air-fluid level. Soft tissues: Negative for soft tissue mass. CT CERVICAL SPINE FINDINGS Alignment: 2 mm anterolisthesis C4-5.  Mild levoscoliosis Skull base and vertebrae: Negative for fracture Soft tissues and spinal canal: Negative for soft tissue mass. Carotid artery calcification bilaterally Disc levels: Advanced disc and facet degeneration throughout the cervical spine. Foraminal encroachment bilaterally at C3-4, C4-5, C5-6, C6-7. Foraminal narrowing is severe on the right at C3-4 C4-5 and C5-6 and C6-7. Upper chest: Negative Other: None IMPRESSION: 1. No acute intracranial abnormality. Atrophy and chronic microvascular ischemia 2. Negative for facial fracture 3. Advanced cervical spondylosis. Negative for cervical spine fracture. Electronically Signed   By: Franchot Gallo M.D.   On: 09/20/2018 08:20    Pending Labs Unresulted Labs (From admission, onward)    Start     Ordered   09/27/18 0500  Creatinine, serum  (enoxaparin (LOVENOX)    CrCl >/= 30 ml/min)  Weekly,   STAT    Comments:  while on enoxaparin therapy    09/20/18  1114   09/21/18 3662  Basic metabolic panel  Tomorrow morning,   STAT  09/20/18 1114   09/21/18 0500  CBC  Tomorrow morning,   STAT     09/20/18 1114   09/20/18 1112  Hemoglobin A1c  Once,   STAT     09/20/18 1114   09/20/18 1112  TSH  Once,   STAT     09/20/18 1114   09/20/18 1111  HIV antibody (Routine Testing)  Once,   STAT     09/20/18 1114   09/20/18 1111  CBC  (enoxaparin (LOVENOX)    CrCl >/= 30 ml/min)  Once,   STAT    Comments:  Baseline for enoxaparin therapy IF NOT ALREADY DRAWN.  Notify MD if PLT < 100 K.    09/20/18 1114   09/20/18 1111  Creatinine, serum  (enoxaparin (LOVENOX)    CrCl >/= 30 ml/min)  Once,   STAT    Comments:  Baseline for enoxaparin therapy IF NOT ALREADY DRAWN.    09/20/18 1114          Vitals/Pain Today's Vitals   09/20/18 1004 09/20/18 1028 09/20/18 1030 09/20/18 1050  BP:   (!) 107/55   Pulse: (!) 56 (!) 56 (!) 56 (!) 54  Resp: 18 (!) 22 17 17   Temp:      TempSrc:      SpO2: (!) 88% (!) 86% (!) 87% 97%  Weight:      Height:      PainSc:        Isolation Precautions No active isolations  Medications Medications  erythromycin ophthalmic ointment (1 application Left Eye Given 09/20/18 0855)  aspirin tablet 324 mg (has no administration in time range)  pantoprazole (PROTONIX) EC tablet 40 mg (has no administration in time range)  insulin aspart protamine- aspart (NOVOLOG MIX 70/30) injection 50 Units (has no administration in time range)  diphenhydrAMINE (BENADRYL) tablet 50 mg (has no administration in time range)  docusate sodium (COLACE) capsule 100 mg (has no administration in time range)  clopidogrel (PLAVIX) tablet 75 mg (has no administration in time range)  tamsulosin (FLOMAX) capsule 0.4 mg (has no administration in time range)  gabapentin (NEURONTIN) tablet 800-1,200 mg (has no administration in time range)  atorvastatin (LIPITOR) tablet 80 mg (has no administration in time range)  carvedilol (COREG) tablet 25 mg (has no  administration in time range)  albuterol (PROVENTIL HFA;VENTOLIN HFA) 108 (90 Base) MCG/ACT inhaler 2 puff (has no administration in time range)  amLODipine (NORVASC) tablet 5 mg (has no administration in time range)  cloNIDine (CATAPRES) tablet 0.3 mg (has no administration in time range)  ezetimibe (ZETIA) tablet 10 mg (has no administration in time range)  isosorbide mononitrate (IMDUR) 24 hr tablet 90 mg (has no administration in time range)  potassium chloride (K-DUR) CR tablet 10 mEq (has no administration in time range)  furosemide (LASIX) injection 40 mg (has no administration in time range)  insulin aspart (novoLOG) injection 0-9 Units (has no administration in time range)  enoxaparin (LOVENOX) injection 40 mg (has no administration in time range)  sodium chloride flush (NS) 0.9 % injection 3 mL (has no administration in time range)  0.9 %  sodium chloride infusion (has no administration in time range)  acetaminophen (TYLENOL) tablet 650 mg (has no administration in time range)    Or  acetaminophen (TYLENOL) suppository 650 mg (has no administration in time range)  HYDROcodone-acetaminophen (NORCO/VICODIN) 5-325 MG per tablet 1-2 tablet (has no administration in time range)  polyethylene glycol (MIRALAX / GLYCOLAX) packet 17 g (has no administration  in time range)  ondansetron (ZOFRAN) tablet 4 mg (has no administration in time range)    Or  ondansetron (ZOFRAN) injection 4 mg (has no administration in time range)  tetracaine (PONTOCAINE) 0.5 % ophthalmic solution 2 drop (2 drops Both Eyes Given 09/20/18 0729)  fluorescein ophthalmic strip 1 strip (1 strip Both Eyes Given 09/20/18 0730)  acetaminophen (TYLENOL) tablet 1,000 mg (1,000 mg Oral Given 09/20/18 0727)  furosemide (LASIX) injection 60 mg (60 mg Intravenous Given 09/20/18 0838)    Mobility Low fall risk   Focused Assessments    R

## 2018-09-20 NOTE — Telephone Encounter (Signed)
Wife advised. 

## 2018-09-20 NOTE — Progress Notes (Signed)
Family Meeting Note  Advance Directive:no  Today a meeting took place with the Patient.spouse  The following clinical team members were present during this meeting:MD  The following were discussed:Patient's diagnosis: chf exacerbation Syncope DM CAD Elevated troponin  , Patient's progosis: > 12 months and Goals for treatment: Full Code  Additional follow-up to be provided: chaplain consult to create AD  Time spent during discussion:16 minutes  Stephen Costa, MD

## 2018-09-20 NOTE — ED Notes (Signed)
Pt with noted hypoxia, placed back on O2 at this time.

## 2018-09-20 NOTE — Progress Notes (Signed)
Inpatient Diabetes Program Recommendations  AACE/ADA: New Consensus Statement on Inpatient Glycemic Control (2015)  Target Ranges:  Prepandial:   less than 140 mg/dL      Peak postprandial:   less than 180 mg/dL (1-2 hours)      Critically ill patients:  140 - 180 mg/dL   Lab Results  Component Value Date   GLUCAP 132 (H) 09/20/2018   HGBA1C 7.6 (A) 06/24/2018    Review of Glycemic Control Results for Stephen [name], Stephen [name] (MRN [id number]) as of 09/20/2018 13:15  Ref. Range 09/20/2018 10:26  Glucose-Capillary Latest Ref Range: 70 - 99 mg/dL 132 (H)   Diabetes history: DM 2 Outpatient Diabetes medications: Novolin 70/30 70 units in the AM and 50 units q HS ([facility]) Current orders for Inpatient glycemic control:  Novolog 70/30 50 units bid Novolog sensitive tid with meals Inpatient Diabetes Program Recommendations:    Referral received. Review of chart indicates that patient was taking Novolin 70/30 at bedtime.  This insulin should be taken with a meal.  It appears that patient had low blood sugar prior to outpatient procedure in February.  To prevent hypoglycemia, consider reducing Novolog 70/30 further to 35 units bid.    Thanks,  Adah Perl, RN, BC-ADM Inpatient Diabetes Coordinator Pager [phone] (8a-5p)

## 2018-09-20 NOTE — ED Provider Notes (Signed)
[facility] Emergency Department Provider Note    First MD Initiated Contact with Patient 09/20/18 0710     (approximate)  I have reviewed the triage vital signs and the nursing notes.   HISTORY  Chief Complaint Fall and Facial Injury    HPI Stephen [name] is a 73 y.o. male below listed past medical history on home oxygen presents the ER after near syncopal episode after the patient was standing up to go to use the restroom early this morning.  Patient states he stood up felt lightheaded and actually fell forward.  He did fall through drywall.  Had to be helped out by his wife.  Has multiple scratches and abrasions to his face.  Is also complained of mild to moderate anterior chest wall pain.  Denies any shortness of breath out of the ordinary at this time.    Past Medical History:  Diagnosis Date  . Adrenal gland anomaly   . Arthritis   . CAD (coronary artery disease)    a. 04/2006 MI and PCI/stenting to mLCx & mRCA; b. 07/2009 Cath: patent LCX/RCA stents;  c. 12/2016 NSTEMI/Cath: LM 30, LAD 30p, 71m 30d, D1/2/3 min irregs, LCX 1027mSR, OM1 min irregs, RCA 20p, 10 ISR, 7063m0d, RPDA min irregs, RPAV 40-->Med Rx.  . CKD (chronic kidney disease), stage III ([place] . Colon polyps   . Diabetes mellitus with neuropathy ([place]CC[place] . DM2 (diabetes mellitus, type 2) (HCC)    insulin requiring  . Dyslipidemia   . GERD (gastroesophageal reflux disease)   . History of echocardiogram    a. TTE 12/2016: EF 50-55%, mod concentric LVH, images inadequate for wall motion assessment, not technically sufficienct to allow for LV dias fxn, calcified mitral annulus   . HTN (hypertension)   . Kidney stones   . Left Renal artery stenosis ([place]CC[place]  a. 07/2013 s/p PTA/stenting ([place]).  . Myocardial infarction ([facility] . OSA (obstructive sleep apnea)   . Pulmonary fibrosis ([facility]CR[place]  a. 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  .  Recurrent UTI   . Rotator cuff disorder    right  . Skin cancer    head  . Thyroid disorder   . Venous insufficiency of both lower extremities    a. s/p laser treatment.   Family History  Problem Relation Age of Onset  . Heart attack Father   . Heart attack Mother   . Heart attack Brother   . COPD Sister   . Arthritis Maternal Grandmother   . Breast cancer Maternal Aunt   . Diabetes Maternal Aunt   . Hypertension Other   . Hypertension Maternal Aunt   . Prostate cancer Neg Hx   . Kidney cancer Neg Hx   . Bladder Cancer Neg Hx   . Colon cancer Neg Hx    Past Surgical History:  Procedure Laterality Date  . adrenal adenoma removal  1990-   right  . CARPAL TUNNEL RELEASE     bilateral  . CATARACT EXTRACTION W/PHACO Right 07/08/2018   Procedure: CATARACT EXTRACTION PHACO AND INTRAOCULAR LENS PLACEMENT (IOC) RIGHT DIABETIC;  Surgeon: KinEulogio BearD;  [facility];  Laterality: Right;  Diabetic - insulin sleep apnea  . CATARACT EXTRACTION W/PHACO Left 08/05/2018   Procedure: CATARACT EXTRACTION PHACO AND INTRAOCULAR LENS PLACEMENT (IOCPresquilleEFT DIABETIC;  Surgeon: KinEulogio BearD;  [facility];  Service: Ophthalmology;  Laterality: Left;  DIABETIC  . CORONARY ANGIOPLASTY WITH STENT PLACEMENT  2007   x 2  . KNEE SURGERY     right  . LEFT HEART CATH AND CORONARY ANGIOGRAPHY N/A 01/11/2017   Procedure: Left Heart Cath and Coronary Angiography;  Surgeon: Wellington Hampshire, MD;  Location: [place] [facility];  Service: Cardiovascular;  Laterality: N/A;  . LUNG BIOPSY    . RENAL ARTERY STENT  2015   L  . SKIN CANCER EXCISION     back of head   Patient Active Problem List   Diagnosis Date Noted  . Knee pain 03/26/2018  . Contusion of foot 02/15/2018  . Encounter for chronic pain management 12/23/2017  . Healthcare maintenance 09/21/2017  . Skin lesion 06/15/2017  . Acute on chronic renal failure ([place])  01/15/2017  . Coronary stent occlusion: Occluded circumflex stent 01/13/2017  . Accelerated hypertension with diastolic congestive heart failure, NYHA class 3 ([place]); elevated LVEDP on chronic catheterization. 01/13/2017  . NSTEMI (non-ST elevated myocardial infarction) ([place]) 01/11/2017  . Blood in urine 12/08/2016  . Lymphedema 10/23/2016  . Low back pain with right-sided sciatica 04/03/2014  . Muscle spasm 04/24/2013  . Chronic edema 11/13/2012  . Leg edema, right 09/29/2012  . SK (seborrheic keratosis) 09/29/2012  . Chronic diastolic CHF (congestive heart failure) ([place]) 12/15/2011  . Sebaceous cyst 11/28/2011  . Morbid obesity ([place]) 07/27/2011  . Hyperlipidemia with target low density lipoprotein (LDL) cholesterol less than 70 mg/dL 04/26/2011  . Adrenal adenoma 03/02/2011  . Back pain 12/06/2010  . Kidney stones 12/06/2010  . Essential hypertension, benign 10/28/2010  . PULMONARY FIBROSIS 09/15/2010  . Coronary artery disease involving native coronary artery of native heart with angina pectoris ([place]) 09/01/2010  . Type 2 diabetes mellitus with renal complication ([place]) 40/98/1191  . METABOLIC SYNDROME X 47/82/9562  . SLEEP APNEA, OBSTRUCTIVE 08/31/2010  . Chronic kidney disease, stage III (moderate) ([place]) 08/31/2010      Prior to Admission medications   Medication Sig Start Date End Date Taking? Authorizing Provider  ACCU-CHEK SOFTCLIX LANCETS lancets Use as instructed to check blood sugar three times daily and as needed.  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 05/16/17   [name], MD  albuterol (PROVENTIL HFA;VENTOLIN HFA) 108 (90 Base) MCG/ACT inhaler Inhale 2 puffs into the lungs every 6 (six) hours as needed for wheezing or shortness of breath. 08/14/18   Schaevitz, Randall An, MD  Alcohol Swabs (B-D SINGLE USE SWABS REGULAR) PADS Use to cleans area prior to checking blood sugar three times daily and as needed.  Diagnosis:  E11.22  Insulin dependent. 05/16/17   [name], MD  amLODipine (NORVASC) 5 MG tablet Take 1 tablet (5 mg total) by mouth daily. 09/16/18   Minna Merritts, MD  aspirin 81 MG tablet Take 324 mg by mouth daily.     [provider]  atorvastatin (LIPITOR) 80 MG tablet TAKE 1 TABLET AT BEDTIME 07/15/18   [name], MD  benzonatate (TESSALON) 100 MG capsule Take 1-2 capsules (100-200 mg total) by mouth 3 (three) times daily as needed. 08/21/18   Elby Beck, FNP  Blood Glucose Monitoring Suppl (ACCU-CHEK AVIVA PLUS) w/Device KIT Use to check blood sugar three times daily and as needed.  Diagnosis: E11.22  Insulin-dependent 05/16/17   [name], MD  carvedilol (COREG) 25 MG tablet TAKE 1 TABLET TWICE DAILY WITH MEALS 07/15/18   [name], MD  cloNIDine (  CATAPRES) 0.3 MG tablet Take 1 tablet (0.3 mg total) by mouth 3 (three) times daily. 09/16/18   Minna Merritts, MD  clopidogrel (PLAVIX) 75 MG tablet Take 1 tablet (75 mg total) by mouth daily. 08/02/17   Minna Merritts, MD  diclofenac sodium (VOLTAREN) 1 % GEL Apply 2 g topically 2 (two) times daily as needed (for left knee pain.). 03/25/18   [name], MD  diphenhydrAMINE (BENADRYL) 50 MG tablet Take 50 mg by mouth at bedtime as needed. Pt takes every night    [provider]  docusate sodium (COLACE) 100 MG capsule Take 100 mg by mouth daily. As needed.     [provider]  ezetimibe (ZETIA) 10 MG tablet Take 1 tablet (10 mg total) by mouth daily. 09/16/18 12/15/18  Minna Merritts, MD  gabapentin (NEURONTIN) 800 MG tablet TAKE 1 TABLET IN THE MORNING  AND TAKE 1 AND 1/2 TABLETS AT BEDTIME 12/30/17   [name], MD  glucose blood (ACCU-CHEK AVIVA PLUS) test strip Use as instructed to check blood sugar 3 times daily or as needed.  Diagnosis:  E11.22  Insulin-dependent. 05/16/17   [name], MD  insulin NPH-regular Human (NOVOLIN 70/30) (70-30) 100 UNIT/ML injection INJECT  70 UNITS SUBCUTANEOUSLY EVERY MORNING  AND  50  units at bedtime 01/22/17   [name], MD  Insulin Syringe-Needle U-100 (INSULIN SYRINGE 1CC/30GX5/16") 30G X 5/16" 1 ML MISC Use as directed to take insulin Dx 250.62 07/14/13   Ria Bush, MD  isosorbide mononitrate (IMDUR) 60 MG 24 hr tablet Take 1.5 tablets (90 mg total) by mouth 2 (two) times daily. 09/16/18   Minna Merritts, MD  nitroGLYCERIN (NITROSTAT) 0.4 MG SL tablet Place 1 tablet (0.4 mg total) under the tongue every 5 (five) minutes as needed. May repeat x3 01/18/17   [name], MD  omeprazole (PRILOSEC) 20 MG capsule Take 20 mg by mouth daily.      [provider]  oxyCODONE (ROXICODONE) 15 MG immediate release tablet Take 1 tablet (15 mg total) by mouth at bedtime as needed for pain. 06/24/18   [name], MD  potassium chloride (K-DUR) 10 MEQ tablet Take 1 tablet (10 mEq total) by mouth daily. 09/17/18 12/16/18  Minna Merritts, MD  tamsulosin (FLOMAX) 0.4 MG CAPS capsule Take 1 capsule (0.4 mg total) by mouth at bedtime. 12/21/17   [name], MD  torsemide (DEMADEX) 20 MG tablet TAKE 2 TABLETS EVERY DAY 12/31/17   Minna Merritts, MD    Allergies Calcium channel blockers    Social History Social History   Tobacco Use  . Smoking status: Never Smoker  . Smokeless tobacco: Never Used  Substance Use Topics  . Alcohol use: No    Alcohol/week: 0.0 standard drinks  . Drug use: No    Review of Systems Patient denies headaches, rhinorrhea, blurry vision, numbness, shortness of breath, chest pain, edema, cough, abdominal pain, nausea, vomiting, diarrhea, dysuria, fevers, rashes or hallucinations unless otherwise stated above in HPI. ____________________________________________   PHYSICAL EXAM:  VITAL SIGNS: Vitals:   09/20/18 0716 09/20/18 0809  BP: (!) 148/92   Pulse: (!) 58   Resp: 20   Temp:  97.6 F (36.4 C)  SpO2: 98%     Constitutional: Alert and oriented.  Eyes: Conjunctivae are normal.   no snellen's lines, no FB   + corneal abrasion at 5-6 oclock just  Next to the pupil abrasion Head: Contusion  abrasion to the face.  Periorbital ecchymosis without proptosis.  Extraocular motions are intact.  Also abrasion of the bridge of the nose.  No septal hematoma.  Midface is stable. Nose: No congestion/rhinnorhea. Mouth/Throat: Mucous membranes are moist.   Neck: No stridor. Painless ROM.  Cardiovascular: Normal rate, regular rhythm. Grossly normal heart sounds.  Good peripheral circulation. Respiratory: Normal respiratory effort.  No retractions. Lungs CTAB. Gastrointestinal: Soft and nontender. No distention. No abdominal bruits. No CVA tenderness. Genitourinary: deferred Musculoskeletal: No lower extremity tenderness nor edema.  No joint effusions. Neurologic:  Normal speech and language. No gross focal neurologic deficits are appreciated. No facial droop Skin:  Skin is warm, dry and intact. No rash noted. Psychiatric: Mood and affect are normal. Speech and behavior are normal.  ____________________________________________   LABS (all labs ordered are listed, but only abnormal results are displayed)  Results for orders placed or performed during the hospital encounter of 09/20/18 (from the past 24 hour(s))  CBC with Differential/Platelet     Status: Abnormal   Collection Time: 09/20/18  7:13 AM  Result Value Ref Range   WBC 13.8 (H) 4.0 - 10.5 K/uL   RBC 5.27 4.22 - 5.81 MIL/uL   Hemoglobin 15.0 13.0 - 17.0 g/dL   HCT 46.3 39.0 - 52.0 %   MCV 87.9 80.0 - 100.0 fL   MCH 28.5 26.0 - 34.0 pg   MCHC 32.4 30.0 - 36.0 g/dL   RDW 14.7 11.5 - 15.5 %   Platelets 195 150 - 400 K/uL   nRBC 0.0 0.0 - 0.2 %   Neutrophils Relative % 85 %   Neutro Abs 12.0 (H) 1.7 - 7.7 K/uL   Lymphocytes Relative 6 %   Lymphs Abs 0.8 0.7 - 4.0 K/uL   Monocytes Relative 6 %   Monocytes Absolute 0.8 0.1 - 1.0 K/uL   Eosinophils Relative 1 %   Eosinophils Absolute 0.1 0.0 - 0.5 K/uL   Basophils Relative 1 %   Basophils  Absolute 0.1 0.0 - 0.1 K/uL   Immature Granulocytes 1 %   Abs Immature Granulocytes 0.09 (H) 0.00 - 0.07 K/uL  Comprehensive metabolic panel     Status: Abnormal   Collection Time: 09/20/18  7:13 AM  Result Value Ref Range   Sodium 141 135 - 145 mmol/L   Potassium 3.7 3.5 - 5.1 mmol/L   Chloride 108 98 - 111 mmol/L   CO2 28 22 - 32 mmol/L   Glucose, Bld 171 (H) 70 - 99 mg/dL   BUN 25 (H) 8 - 23 mg/dL   Creatinine, Ser 1.89 (H) 0.61 - 1.24 mg/dL   Calcium 8.4 (L) 8.9 - 10.3 mg/dL   Total Protein 6.5 6.5 - 8.1 g/dL   Albumin 2.7 (L) 3.5 - 5.0 g/dL   AST 31 15 - 41 U/L   ALT 26 0 - 44 U/L   Alkaline Phosphatase 81 38 - 126 U/L   Total Bilirubin 0.6 0.3 - 1.2 mg/dL   GFR calc non Af Amer 35 (L) >60 mL/min   GFR calc Af Amer 41 (L) >60 mL/min   Anion gap 5 5 - 15  Troponin I - ONCE - STAT     Status: Abnormal   Collection Time: 09/20/18  7:13 AM  Result Value Ref Range   Troponin I 0.04 (HH) <0.03 ng/mL  Brain natriuretic peptide     Status: Abnormal   Collection Time: 09/20/18  7:13 AM  Result Value Ref Range   B Natriuretic  Peptide 149.0 (H) 0.0 - 100.0 pg/mL   ____________________________________________  EKG My review and personal interpretation at Time: 7:08   Indication: syncope  Rate: 60  Rhythm: sinus Axis: left Other: no stemi criteria, wandering baseline artifact in V1. abnml ekg ____________________________________________  RADIOLOGY  I personally reviewed all radiographic images ordered to evaluate for the above acute complaints and reviewed radiology reports and findings.  These findings were personally discussed with the patient.  Please see medical record for radiology report.  ____________________________________________   PROCEDURES  Procedure(s) performed:  .Critical Care Performed by: Merlyn Lot, MD Authorized by: Merlyn Lot, MD   Critical care provider statement:    Critical care time (minutes):  30   Critical care time was  exclusive of:  Separately billable procedures and treating other patients   Critical care was time spent personally by me on the following activities:  Development of treatment plan with patient or surrogate, discussions with consultants, evaluation of patient's response to treatment, examination of patient, obtaining history from patient or surrogate, ordering and performing treatments and interventions, ordering and review of laboratory studies, ordering and review of radiographic studies, pulse oximetry, re-evaluation of patient's condition and review of old charts      Critical Care performed: yes ____________________________________________   INITIAL IMPRESSION / ASSESSMENT AND PLAN / ED COURSE  Pertinent labs & imaging results that were available during my care of the patient were reviewed by me and considered in my medical decision making (see chart for details).   DDX: Dehydration, anemia, electrolyte abnormality, contusion, subdural, subarachnoid, corneal injury, globe rupture, fracture, pulmonary contusion, pneumonia, congestive heart failure, dysrhythmia  KAYO ZION is a 73 y.o. who presents to the ED with syncopal episode as described above.  Patient with obvious trauma to the face.  CT imaging and chest x-ray ordered for above differential.  Patient found to have acute on chronic respiratory failure with hypoxia requiring increased O2 supplementation.  Concerning for contusion versus worsening congestive heart failure as he does have significant peripheral edema.  The patient will be placed on continuous pulse oximetry and telemetry for monitoring.  Laboratory evaluation will be sent to evaluate for the above complaints.     Clinical Course as of Sep 19 901  Fri Sep 20, 2018  0829 Patient with corneal abrasion at 5 and 6:00 on the left eye just below the pupil.  No Snellen lines.  No evidence of foreign body.  Will start on ophthalmic drops.   [PR]  6803 Patient's albumin  is significantly low 2.  We will give IV Lasix for his hypoxia.   [PR]    Clinical Course User Index [PR] Merlyn Lot, MD     As part of my medical decision making, I reviewed the following data within the [facility] notes reviewed and incorporated, Labs reviewed, notes from prior ED visits.   ____________________________________________   FINAL CLINICAL IMPRESSION(S) / ED DIAGNOSES  Final diagnoses:  Syncope, unspecified syncope type  Acute on chronic respiratory failure with hypoxia (HCC)      NEW MEDICATIONS STARTED DURING THIS VISIT:  New Prescriptions   No medications on file     Note:  This document was prepared using Dragon voice recognition software and may include unintentional dictation errors.    Merlyn Lot, MD 09/20/18 [id number]

## 2018-09-20 NOTE — Consult Note (Signed)
Cardiology Consult    Patient ID: Stephen [name] MRN: [id number], DOB/AGE: 71-Sep-1947   Admit date: 09/20/2018 Date of Consult: 09/20/2018  Primary Physician: [name], MD Primary Cardiologist: Ida Rogue, MD Requesting Provider: Gardiner Coins, MD  Patient Profile    Stephen [name] is a 73 y.o. male with a history of coronary artery disease status post prior circumflex and RCA stenting, hypertension, hyperlipidemia, left renal artery stenosis, pulmonary fibrosis, diabetes, and stage III chronic kidney disease, who is being seen today for the evaluation of syncope at the request of Dr. Benjie Karvonen.  Past Medical History   Past Medical History:  Diagnosis Date   Adrenal gland anomaly    Arthritis    CAD (coronary artery disease)    a. 04/2006 MI and PCI/stenting to mLCx & mRCA; b. 07/2009 Cath: patent LCX/RCA stents;  c. 12/2016 NSTEMI/Cath: LM 30, LAD 30p, 85m, 30d, D1/2/3 min irregs, LCX 111m ISR, OM1 min irregs, RCA 20p, 10 ISR, 52m, 50d, RPDA min irregs, RPAV 40-->Med Rx; d. 12/2017 MV: fixed lateral wall scar, mild anterior/anterior septal ischemia. EF 30-44%.   CKD (chronic kidney disease), stage III (HCC)    Colon polyps    DM2 (diabetes mellitus, type 2) (HCC)    insulin requiring   Dyslipidemia    GERD (gastroesophageal reflux disease)    History of echocardiogram    a. TTE 12/2016: EF 50-55%, mod concentric LVH, images inadequate for wall motion assessment, not technically sufficienct to allow for LV dias fxn, calcified mitral annulus    HTN (hypertension)    Kidney stones    Left Renal artery stenosis ([place])    a. 07/2013 s/p PTA/stenting ([place]); b. 07/2017 Renal Duplex: No significant RAS.   Myocardial infarction (HCC)    OSA (obstructive sleep apnea)    Pulmonary fibrosis ([place])    a. 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   Recurrent UTI    Rotator cuff disorder    right   Skin cancer    head    Thyroid disorder    Venous insufficiency of both lower extremities    a. s/p laser treatment.    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   CARPAL TUNNEL RELEASE     bilateral   CATARACT EXTRACTION W/PHACO Right 07/08/2018   Procedure: CATARACT EXTRACTION PHACO AND INTRAOCULAR LENS PLACEMENT ([place]) RIGHT DIABETIC;  Surgeon: Eulogio Bear, MD;  Location: [facility];  Service: Ophthalmology;  Laterality: Right;  Diabetic - insulin sleep apnea   CATARACT EXTRACTION W/PHACO Left 08/05/2018   Procedure: CATARACT EXTRACTION PHACO AND INTRAOCULAR LENS PLACEMENT ([place]) LEFT DIABETIC;  Surgeon: Eulogio Bear, MD;  Location: [facility];  Service: Ophthalmology;  Laterality: Left;  DIABETIC   CORONARY ANGIOPLASTY WITH STENT PLACEMENT  2007   x 2   KNEE SURGERY     right   LEFT HEART CATH AND CORONARY ANGIOGRAPHY N/A 01/11/2017   Procedure: Left Heart Cath and Coronary Angiography;  Surgeon: Wellington Hampshire, MD;  Location: [facility];  Service: Cardiovascular;  Laterality: N/A;   LUNG BIOPSY     RENAL ARTERY STENT  2015   L   SKIN CANCER EXCISION     back of head     Allergies  Allergies  Allergen Reactions   Calcium Channel Blockers     Would avoid if possible due to h/o peripheral edema  History of Present Illness    73 year old male with above complex past medical history including coronary artery disease, CKD stage III, type 2 diabetes mellitus, hypertension, hyperlipidemia, left renal artery stenosis status post PTA and stenting, pulmonary fibrosis status post wedge resection in 2011, sleep apnea, and obesity.  His cardiac history dates back to October 2007, when he suffered an MI and required stenting of the circumflex and mid RCA.  In July 2018, he suffered a non-STEMI and was found to have an occluded left circumflex in the setting of in-stent restenosis.  Medical therapy was  recommended.  Follow-up stress testing in July 2019 showed a fixed lateral wall scar with mild anterior/anterior septal ischemia.  EF was 30 to 44%.  He was managed conservatively in the setting of chronic kidney disease.  He was last seen in clinic on March 16, at which time he reported experiencing upper respiratory infection about a month prior.  He had chest pain with coughing during that episode but this subsequently improved following a short course of prednisone. Patient had been noting occasional drops in oxygen saturations at home, sometimes into the 80s when he would ambulate without oxygen.  As result, he was wearing 2 L/min pretty much around-the-clock.  He reported chronic dyspnea and was thought to have mild volume excess and lower extremity swelling.  He had not taken his diuretic that day and he was advised to resume torsemide at 40 mg twice daily for several days and then to come back to 40 mg daily.  Patient says he took 40 mg of that particular day but has not taken since.  He is continued to have intermittent hypoxia at home and also chronic dyspnea on exertion.  This morning @ 3:30, he was sitting in his chair and feeling fine and stood up and had sudden lightheadedness.  As he walked across his room, he had progressive lightheadedness and realized he was going to lose consciousness.  He did lose consciousness and fell forward, striking his face on his wall, resulting in breaking of the drywall.  His wife heard this occur and found him immediately.  She had to help him to remove his head from inside the wall and says that she believes he regained consciousness almost immediately.  He did have trauma and scratches to his face.  His wife drove him into the ED.  Here, CT of the head and chest did not show any fractures. H/H wnl. Renal fxn stable (creat 1.89). Trop mildly elevated @ 0.04.  ECG non-acute.  He denies chest pain.  He was admitted and we've been asked to eval.   Inpatient  Medications     amLODipine  5 mg Oral Daily   aspirin  324 mg Oral Daily   atorvastatin  80 mg Oral QHS   carvedilol  25 mg Oral BID WC   cloNIDine  0.3 mg Oral TID   clopidogrel  75 mg Oral Daily   diphenhydrAMINE  50 mg Oral QHS   enoxaparin (LOVENOX) injection  40 mg Subcutaneous Q24H   erythromycin   Left Eye Q6H   ezetimibe  10 mg Oral Daily   furosemide  40 mg Intravenous BID   gabapentin  800 mg Oral q morning - 10a   gabapentin  1,200 mg Oral QHS   insulin aspart  0-9 Units Subcutaneous TID WC   insulin aspart protamine- aspart  35 Units Subcutaneous BID WC   isosorbide mononitrate  90 mg Oral BID   pantoprazole  40 mg Oral Daily   potassium chloride  10 mEq Oral Daily   sodium chloride flush  3 mL Intravenous Q12H   tamsulosin  0.4 mg Oral QHS    Family History    Family History  Problem Relation Age of Onset   Heart attack Father    Heart attack Mother    Heart attack Brother    COPD Sister    Arthritis Maternal Grandmother    Breast cancer Maternal Aunt    Diabetes Maternal Aunt    Hypertension Other    Hypertension Maternal Aunt    Prostate cancer Neg Hx    Kidney cancer Neg Hx    Bladder Cancer Neg Hx    Colon cancer Neg Hx    He indicated that his mother is deceased. He indicated that his father is deceased. He indicated that only one of his two sisters is alive. He indicated that his brother is deceased. He indicated that the status of his maternal grandmother is unknown. He indicated that the status of his neg hx is unknown. He indicated that the status of his other is unknown.   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    Comment: significant metal  dust exposure, significant asbestos exposure    Employer: [employer] resource strain: Not on Training and development officer insecurity:    Worry: Not on file    Inability: Not on file   Transportation needs:    Medical: Not on file    Non-medical: Not on file  Tobacco Use   Smoking status: Never Smoker   Smokeless tobacco: Never Used  Substance and Sexual Activity   Alcohol use: No    Alcohol/week: 0.0 standard drinks   Drug use: No   Sexual activity: Yes  Lifestyle   Physical activity:    Days per week: Not on file    Minutes per session: Not on file   Stress: Not on file  Relationships   Social connections:    Talks on phone: Not on file    Gets together: Not on file    Attends religious service: Not on file    Active member of club or organization: Not on file    Attends meetings of clubs or organizations: Not on file    Relationship status: Not on file   Intimate partner violence:    Fear of current or ex partner: Not on file    Emotionally abused: Not on file    Physically abused: Not on file    Forced sexual activity: Not on file  Other Topics Concern   Not on file  Social History Narrative   Enjoys fishing   Y[employer] fan     Review of Systems    General:  No chills, fever, night sweats or weight changes.  Cardiovascular:  No chest pain, +++ dyspnea on exertion, no edema, orthopnea, palpitations, paroxysmal nocturnal dyspnea. +++ presyncope/syncope. Dermatological: No rash, lesions/masses Respiratory: No cough, +++ dyspnea Urologic: No hematuria, dysuria Abdominal:   No nausea, vomiting, diarrhea, bright red blood per rectum, melena, or hematemesis Neurologic:  No visual changes, wkns, changes in mental status. All other systems reviewed  and are otherwise negative except as noted above.  Physical Exam    Blood pressure (!) 154/64, pulse 61, temperature 98.8 F (37.1 C), resp. rate 18, height 5\' 5"  (1.651 m), weight 119.3 kg, SpO2 93 %.    General: Pleasant, NAD Psych: Normal affect. Neuro: Alert and oriented X 3. Moves all extremities spontaneously. HEENT: Significant bruising and scratching to face.  Neck: Supple without bruits. Difficult to gauge jvp 2/2 girth. Lungs:  Resp regular and unlabored, diminished breath sounds bilat. Heart: RRR no s3, s4, or murmurs. Abdomen: Soft, non-tender, non-distended, BS + x 4.  Extremities: No clubbing, cyanosis or edema. DP/PT/Radials 2+ and equal bilaterally.  Labs    Troponin  Recent Labs    09/20/18 0713  TROPONINI 0.04*   Lab Results  Component Value Date   WBC 13.8 (H) 09/20/2018   HGB 15.0 09/20/2018   HCT 46.3 09/20/2018   MCV 87.9 09/20/2018   PLT 195 09/20/2018    Recent Labs  Lab 09/20/18 0713  NA 141  K 3.7  CL 108  CO2 28  BUN 25*  CREATININE 1.89*  CALCIUM 8.4*  PROT 6.5  BILITOT 0.6  ALKPHOS 81  ALT 26  AST 31  GLUCOSE 171*   Lab Results  Component Value Date   CHOL 91 12/18/2017   HDL 23.80 (L) 12/18/2017   LDLCALC 45 12/18/2017   TRIG 111.0 12/18/2017    Radiology Studies    Dg Chest 2 View  Result Date: 09/20/2018 CLINICAL DATA:  73 year old male status post syncope and fall. Chest wall pain. EXAM: CHEST - 2 VIEW COMPARISON:  Chest radiographs 08/14/2018 and earlier. FINDINGS: Chronically increased AP dimension to the chest and elevated anterior diaphragm giving the appearance of low lung volumes on the AP view. Evidence of new small pleural effusions on the lateral. Trace fluid in the right minor fissure. No pneumothorax or pulmonary edema. Respiratory motion artifact on the lateral. No confluent pulmonary opacity. Stable cardiac size and mediastinal contours. Flowing osteophytes or syndesmophytes throughout the thoracic spine with evidence of ankylosis. No displaced rib fracture identified. Nonobstructed visible bowel gas pattern. IMPRESSION: 1. New small pleural effusion(s) since February. 2. No other acute pulmonary process or acute  traumatic injury identified. Electronically Signed   By: Genevie Ann M.D.   On: 09/20/2018 08:01   Ct Head Wo Contrast  Result Date: 09/20/2018 CLINICAL DATA:  Fall.  Trauma EXAM: CT HEAD WITHOUT CONTRAST CT MAXILLOFACIAL WITHOUT CONTRAST CT CERVICAL SPINE WITHOUT CONTRAST TECHNIQUE: Multidetector CT imaging of the head, cervical spine, and maxillofacial structures were performed using the standard protocol without intravenous contrast. Multiplanar CT image reconstructions of the cervical spine and maxillofacial structures were also generated. COMPARISON:  None. FINDINGS: CT HEAD FINDINGS Brain: Mild atrophy with mild chronic microvascular ischemic type changes in the white matter. No acute infarct, hemorrhage, mass. No fluid collection or midline shift. Vascular: Negative for hyperdense vessel Skull: Negative for fracture Other: Negative CT MAXILLOFACIAL FINDINGS Osseous: Negative for facial fracture Orbits: Bilateral periorbital edema. Bilateral cataract surgery. No orbital mass. Sinuses: Mild mucosal edema in the paranasal sinuses without air-fluid level. Soft tissues: Negative for soft tissue mass. CT CERVICAL SPINE FINDINGS Alignment: 2 mm anterolisthesis C4-5.  Mild levoscoliosis Skull base and vertebrae: Negative for fracture Soft tissues and spinal canal: Negative for soft tissue mass. Carotid artery calcification bilaterally Disc levels: Advanced disc and facet degeneration throughout the cervical spine. Foraminal encroachment bilaterally at C3-4, C4-5, C5-6, C6-7. Foraminal narrowing is severe on the  right at C3-4 C4-5 and C5-6 and C6-7. Upper chest: Negative Other: None IMPRESSION: 1. No acute intracranial abnormality. Atrophy and chronic microvascular ischemia 2. Negative for facial fracture 3. Advanced cervical spondylosis. Negative for cervical spine fracture. Electronically Signed   By: Franchot Gallo M.D.   On: 09/20/2018 08:20   Ct Chest Wo Contrast  Result Date: 09/20/2018 CLINICAL DATA:   73 year old male with history of trauma from a fall. Injury to chest. EXAM: CT CHEST WITHOUT CONTRAST TECHNIQUE: Multidetector CT imaging of the chest was performed following the standard protocol without IV contrast. COMPARISON:  No priors. FINDINGS: Cardiovascular: Heart size is mildly enlarged. There is no significant pericardial fluid, thickening or pericardial calcification. There is aortic atherosclerosis, as well as atherosclerosis of the great vessels of the mediastinum and the coronary arteries, including calcified atherosclerotic plaque in the left main, left anterior descending, left circumflex and right coronary arteries. Mild calcification of the aortic valve. Moderate calcification of the mitral annulus. Mediastinum/Nodes: No pathologically enlarged mediastinal or hilar lymph nodes. Please note that accurate exclusion of hilar adenopathy is limited on noncontrast CT scans. Esophagus is unremarkable in appearance. No axillary lymphadenopathy. Lungs/Pleura: No pneumothorax. Elevation of the right hemidiaphragm, which appears similar to prior CT the abdomen and pelvis 11/23/2016. Trace bilateral pleural effusions lying dependently. Areas of linear scarring are noted throughout the lung bases bilaterally, similar to the prior study. In addition, there are areas of ground-glass attenuation, septal thickening, thickening of the peribronchovascular interstitium and regional areas of architectural distortion scattered throughout the lung bases bilaterally, concerning for interstitial lung disease. No frank honeycombing noted. No acute consolidative airspace disease. No pleural effusions. No suspicious appearing pulmonary nodules or masses are noted. Upper Abdomen: Well-defined 3.2 x 3.2 cm low-attenuation (8 HU) left adrenal nodule, similar to the prior examination, compatible with an adenoma. Surgical clips in the upper right retroperitoneum. Musculoskeletal: There are no aggressive appearing lytic or blastic  lesions noted in the visualized portions of the skeleton. IMPRESSION: 1. No evidence of significant acute traumatic injury to the thorax. 2. There is a spectrum of findings in the lungs concerning for interstitial lung disease, considered indeterminate for usual interstitial pneumonia (UIP) per current ATS guidelines. Outpatient referral to Pulmonology for further evaluation is suggested in the near future. Additionally, follow-up high-resolution chest CT is recommended in 12 months to assess for temporal changes in the appearance of the lung parenchyma. 3. Trace bilateral pleural effusions lying dependently. 4. Aortic atherosclerosis, in addition to left main and 3 vessel coronary artery disease. Please note that although the presence of coronary artery calcium documents the presence of coronary artery disease, the severity of this disease and any potential stenosis cannot be assessed on this non-gated CT examination. Assessment for potential risk factor modification, dietary therapy or pharmacologic therapy may be warranted, if clinically indicated. 5. There are calcifications of the aortic valve and mitral annulus. Echocardiographic correlation for evaluation of potential valvular dysfunction may be warranted if clinically indicated. 6. Mild cardiomegaly. Aortic Atherosclerosis (ICD10-I70.0). Electronically Signed   By: Vinnie Langton M.D.   On: 09/20/2018 08:54   Ct Cervical Spine Wo Contrast  Result Date: 09/20/2018 CLINICAL DATA:  Fall.  Trauma EXAM: CT HEAD WITHOUT CONTRAST CT MAXILLOFACIAL WITHOUT CONTRAST CT CERVICAL SPINE WITHOUT CONTRAST TECHNIQUE: Multidetector CT imaging of the head, cervical spine, and maxillofacial structures were performed using the standard protocol without intravenous contrast. Multiplanar CT image reconstructions of the cervical spine and maxillofacial structures were also generated. COMPARISON:  None.  FINDINGS: CT HEAD FINDINGS Brain: Mild atrophy with mild chronic  microvascular ischemic type changes in the white matter. No acute infarct, hemorrhage, mass. No fluid collection or midline shift. Vascular: Negative for hyperdense vessel Skull: Negative for fracture Other: Negative CT MAXILLOFACIAL FINDINGS Osseous: Negative for facial fracture Orbits: Bilateral periorbital edema. Bilateral cataract surgery. No orbital mass. Sinuses: Mild mucosal edema in the paranasal sinuses without air-fluid level. Soft tissues: Negative for soft tissue mass. CT CERVICAL SPINE FINDINGS Alignment: 2 mm anterolisthesis C4-5.  Mild levoscoliosis Skull base and vertebrae: Negative for fracture Soft tissues and spinal canal: Negative for soft tissue mass. Carotid artery calcification bilaterally Disc levels: Advanced disc and facet degeneration throughout the cervical spine. Foraminal encroachment bilaterally at C3-4, C4-5, C5-6, C6-7. Foraminal narrowing is severe on the right at C3-4 C4-5 and C5-6 and C6-7. Upper chest: Negative Other: None IMPRESSION: 1. No acute intracranial abnormality. Atrophy and chronic microvascular ischemia 2. Negative for facial fracture 3. Advanced cervical spondylosis. Negative for cervical spine fracture. Electronically Signed   By: Franchot Gallo M.D.   On: 09/20/2018 08:20   US Carotid Bilateral  Result Date: 09/20/2018 CLINICAL DATA:  CVA EXAM: BILATERAL CAROTID DUPLEX ULTRASOUND TECHNIQUE: Pearline Cables scale imaging, color Doppler and duplex ultrasound were performed of bilateral carotid and vertebral arteries in the neck. COMPARISON:  None. FINDINGS: Criteria: Quantification of carotid stenosis is based on velocity parameters that correlate the residual internal carotid diameter with NASCET-based stenosis levels, using the diameter of the distal internal carotid lumen as the denominator for stenosis measurement. The following velocity measurements were obtained: RIGHT ICA: 90 cm/sec CCA: 77 cm/sec SYSTOLIC ICA/CCA RATIO:  1.2 ECA: 106 cm/sec LEFT ICA: 101 cm/sec CCA:  67 cm/sec SYSTOLIC ICA/CCA RATIO:  1.5 ECA: 97 cm/sec RIGHT CAROTID ARTERY: Moderate irregular calcified plaque in the bulb. Low resistance internal carotid Doppler pattern is preserved. RIGHT VERTEBRAL ARTERY:  Antegrade. LEFT CAROTID ARTERY: Mild irregular calcified plaque in the bulb. Low resistance internal carotid Doppler pattern is preserved. LEFT VERTEBRAL ARTERY:  Antegrade. IMPRESSION: Less than 50% stenosis in the right and left internal carotid arteries. Electronically Signed   By: Marybelle Killings M.D.   On: 09/20/2018 14:44   Ct Maxillofacial Wo Contrast  Result Date: 09/20/2018 CLINICAL DATA:  Fall.  Trauma EXAM: CT HEAD WITHOUT CONTRAST CT MAXILLOFACIAL WITHOUT CONTRAST CT CERVICAL SPINE WITHOUT CONTRAST TECHNIQUE: Multidetector CT imaging of the head, cervical spine, and maxillofacial structures were performed using the standard protocol without intravenous contrast. Multiplanar CT image reconstructions of the cervical spine and maxillofacial structures were also generated. COMPARISON:  None. FINDINGS: CT HEAD FINDINGS Brain: Mild atrophy with mild chronic microvascular ischemic type changes in the white matter. No acute infarct, hemorrhage, mass. No fluid collection or midline shift. Vascular: Negative for hyperdense vessel Skull: Negative for fracture Other: Negative CT MAXILLOFACIAL FINDINGS Osseous: Negative for facial fracture Orbits: Bilateral periorbital edema. Bilateral cataract surgery. No orbital mass. Sinuses: Mild mucosal edema in the paranasal sinuses without air-fluid level. Soft tissues: Negative for soft tissue mass. CT CERVICAL SPINE FINDINGS Alignment: 2 mm anterolisthesis C4-5.  Mild levoscoliosis Skull base and vertebrae: Negative for fracture Soft tissues and spinal canal: Negative for soft tissue mass. Carotid artery calcification bilaterally Disc levels: Advanced disc and facet degeneration throughout the cervical spine. Foraminal encroachment bilaterally at C3-4, C4-5, C5-6,  C6-7. Foraminal narrowing is severe on the right at C3-4 C4-5 and C5-6 and C6-7. Upper chest: Negative Other: None IMPRESSION: 1. No acute intracranial abnormality. Atrophy and chronic  microvascular ischemia 2. Negative for facial fracture 3. Advanced cervical spondylosis. Negative for cervical spine fracture. Electronically Signed   By: Franchot Gallo M.D.   On: 09/20/2018 08:20    ECG & Cardiac Imaging    Sinus brady, 58, LAD, LAFB, antsept infarct - personally reviewed.  Assessment & Plan    1.  Syncope:  Pt presented to ED this AM after developing sudden presyncope followed by abrupt syncope after standing up and walking across his home.  He suffered significant bruising and scratching to his face but fortunately his head CT is negative for fx.  ECG non-acute.  Will follow tele and repeat echo.  Check orthostatics.  2.  CAD/elevated trop:  Mild trop elevation in the setting of above.  No c/p.  Chronic dyspnea w/o acute worsening.  Trend troponin.  This does not appear to represent ACS.  Continue aspirin, statin, beta-blocker, Plavix, Zetia, and nitrate.  3.  Essential hypertension: Check orthostatics given presyncope and syncope.  Continue current regimen.  4.  Chronic diastolic congestive heart failure: He was recently felt to have mild volume overload at clinic visit on March 16.  He took a dose of torsemide that day but has not been taking since.  Volume stable today.  Weight is stable as well.  Lasix is ordered.  I am going to DC this for now as he was not typically taking torsemide at home and he does have a prior history of acute kidney injury in the setting of overdiuresis.  BNP was only 149.  Resume home medications and follow blood pressure closely.  5.  Stage III chronic kidney disease: Creatinine 1.89, which is slightly better than in February.  6.  Type 2 diabetes mellitus: A1c 8.4.  Insulin management per internal medicine.  7.  Hyperlipidemia: LDL 45 in June 2019.  Continue  statin and Zetia.Wynn Maudlin, NP 09/20/2018, 4:07 PM  For questions or updates, please contact   Please consult [email or web address] for contact info under Cardiology/STEMI.

## 2018-09-20 NOTE — ED Notes (Signed)
Per request of Dr. Benjie Karvonen, O2 turned off at this time.  Pt was noted to have O2 sats of 95% on 3L [place].

## 2018-09-21 ENCOUNTER — Inpatient Hospital Stay: Admit: 2018-09-21 | Payer: Medicare HMO

## 2018-09-21 ENCOUNTER — Inpatient Hospital Stay (HOSPITAL_COMMUNITY)
Admit: 2018-09-21 | Discharge: 2018-09-21 | Disposition: A | Discharge status: Home / Self Care | Payer: Medicare HMO | Attending: Nurse Practitioner | Admitting: Nurse Practitioner

## 2018-09-21 DIAGNOSIS — N183 Chronic kidney disease, stage 3 (moderate): Secondary | ICD-10-CM

## 2018-09-21 DIAGNOSIS — J9621 Acute and chronic respiratory failure with hypoxia: Secondary | ICD-10-CM

## 2018-09-21 DIAGNOSIS — R55 Syncope and collapse: Secondary | ICD-10-CM

## 2018-09-21 DIAGNOSIS — J849 Interstitial pulmonary disease, unspecified: Secondary | ICD-10-CM

## 2018-09-21 DIAGNOSIS — N179 Acute kidney failure, unspecified: Secondary | ICD-10-CM

## 2018-09-21 DIAGNOSIS — I361 Nonrheumatic tricuspid (valve) insufficiency: Secondary | ICD-10-CM

## 2018-09-21 DIAGNOSIS — I25119 Atherosclerotic heart disease of native coronary artery with unspecified angina pectoris: Secondary | ICD-10-CM

## 2018-09-21 DIAGNOSIS — I214 Non-ST elevation (NSTEMI) myocardial infarction: Secondary | ICD-10-CM

## 2018-09-21 DIAGNOSIS — I272 Pulmonary hypertension, unspecified: Secondary | ICD-10-CM

## 2018-09-21 LAB — BASIC METABOLIC PANEL
Anion gap: 10 (ref 5–15)
BUN: 37 mg/dL — ABNORMAL HIGH (ref 8–23)
CO2: 24 mmol/L (ref 22–32)
Calcium: 8.2 mg/dL — ABNORMAL LOW (ref 8.9–10.3)
Chloride: 103 mmol/L (ref 98–111)
Creatinine, Ser: 2.66 mg/dL — ABNORMAL HIGH (ref 0.61–1.24)
GFR calc Af Amer: 27 mL/min — ABNORMAL LOW (ref >60)
GFR calc non Af Amer: 23 mL/min — ABNORMAL LOW (ref >60)
Glucose, Bld: 395 mg/dL — ABNORMAL HIGH (ref 70–99)
Potassium: 4.1 mmol/L (ref 3.5–5.1)
Sodium: 137 mmol/L (ref 135–145)

## 2018-09-21 LAB — GLUCOSE, CAPILLARY
Glucose-Capillary: 347 mg/dL — ABNORMAL HIGH (ref 70–99)
Glucose-Capillary: 380 mg/dL — ABNORMAL HIGH (ref 70–99)
Glucose-Capillary: 435 mg/dL — ABNORMAL HIGH (ref 70–99)
Glucose-Capillary: 325 mg/dL — ABNORMAL HIGH (ref 70–99)

## 2018-09-21 LAB — CBC
HCT: 43.3 % (ref 39.0–52.0)
Hemoglobin: 14.1 g/dL (ref 13.0–17.0)
MCH: 28.5 pg (ref 26.0–34.0)
MCHC: 32.6 g/dL (ref 30.0–36.0)
MCV: 87.7 fL (ref 80.0–100.0)
Platelets: 209 10*3/uL (ref 150–400)
RBC: 4.94 MIL/uL (ref 4.22–5.81)
RDW: 14.6 % (ref 11.5–15.5)
WBC: 10.5 10*3/uL (ref 4.0–10.5)
nRBC: 0 % (ref 0.0–0.2)

## 2018-09-21 LAB — ECHOCARDIOGRAM COMPLETE
Height: 65 in
Weight: 4208 oz

## 2018-09-21 LAB — HIV ANTIBODY (ROUTINE TESTING W REFLEX): HIV Screen 4th Generation wRfx: NONREACTIVE

## 2018-09-21 LAB — TROPONIN I: Troponin I: 0.03 ng/mL (ref <0.03)

## 2018-09-21 MED ORDER — HEPARIN SODIUM (PORCINE) 5000 UNIT/ML IJ SOLN
5000.0000 [IU] | Freq: Three times a day (TID) | INTRAMUSCULAR | Status: DC
  Administered 2018-09-21 – 2018-09-22 (×4): 5000 [IU] via SUBCUTANEOUS
  Filled 2018-09-21 (×4): qty 1

## 2018-09-21 MED ORDER — PERFLUTREN LIPID MICROSPHERE
1.0000 mL | INTRAVENOUS | Status: AC | PRN
  Administered 2018-09-21: 4 mL via INTRAVENOUS
  Filled 2018-09-21: qty 10

## 2018-09-21 MED ORDER — INSULIN ASPART 100 UNIT/ML ~~LOC~~ SOLN
15.0000 [IU] | Freq: Once | SUBCUTANEOUS | Status: AC
  Administered 2018-09-21: 15 [IU] via SUBCUTANEOUS
  Filled 2018-09-21: qty 1

## 2018-09-21 NOTE — Progress Notes (Signed)
*  PRELIMINARY RESULTS* Echocardiogram 2D Echocardiogram has been performed. Definity IV Contrast was used on this study.  Stephen [name] Nafis Farnan 09/21/2018, 12:46 PM

## 2018-09-21 NOTE — Plan of Care (Signed)
Walked around the unit without difficulty. 02 sats stable wearing O2 [place] 3L.

## 2018-09-21 NOTE — Progress Notes (Signed)
CBG = 435.  Contacted Dr. Margaretmary Eddy.  One time order for Novolog 15 units.  Also gave him his scheduled 70/30 novolog

## 2018-09-21 NOTE — Progress Notes (Signed)
[facility] [place] NAME: Stephen [name]    MR#:  [id number]  DATE OF BIRTH:  1945/12/25  SUBJECTIVE:  CHIEF COMPLAINT: Patient is resting comfortably.  Wife at bedside. This reports generalized weakness otherwise no chest pain or shortness of breath  REVIEW OF SYSTEMS:  CONSTITUTIONAL: No fever, fatigue or weakness.  EYES: No blurred or double vision.  EARS, NOSE, AND THROAT: No tinnitus or ear pain.  RESPIRATORY: No cough, shortness of breath, wheezing or hemoptysis.  CARDIOVASCULAR: No chest pain, orthopnea, edema.  GASTROINTESTINAL: No nausea, vomiting, diarrhea or abdominal pain.  GENITOURINARY: No dysuria, hematuria.  ENDOCRINE: No polyuria, nocturia,  HEMATOLOGY: No anemia, easy bruising or bleeding SKIN: Face with bruises from the fall MUSCULOSKELETAL: No joint pain or arthritis.   NEUROLOGIC: No tingling, numbness, weakness.  PSYCHIATRY: No anxiety or depression.   DRUG ALLERGIES:   Allergies  Allergen Reactions  . Calcium Channel Blockers     Would avoid if possible due to h/o peripheral edema    VITALS:  Blood pressure 134/68, pulse (!) 54, temperature (!) 97.4 F (36.3 C), temperature source Oral, resp. rate 19, height 5\' 5"  (1.651 m), weight 119.3 kg, SpO2 96 %.  PHYSICAL EXAMINATION:  GENERAL:  73 y.o.-year-old patient lying in the bed with no acute distress.  EYES: Pupils equal, round, reactive to light and accommodation. No scleral icterus. Extraocular muscles intact.  HEENT: Head atraumatic, normocephalic. Oropharynx and nasopharynx clear.  NECK:  Supple, no jugular venous distention. No thyroid enlargement, no tenderness.  LUNGS: Normal breath sounds bilaterally, no wheezing, rales,rhonchi or crepitation. No use of accessory muscles of respiration.  CARDIOVASCULAR: S1, S2 normal. No murmurs, rubs, or gallops.  ABDOMEN: Soft, nontender, nondistended. Bowel sounds present.  EXTREMITIES: No pedal edema,  cyanosis, or clubbing.  NEUROLOGIC: Awake, alert and oriented x3 sensation intact. Gait not checked.  PSYCHIATRIC: The patient is alert and oriented x 3.  SKIN: No obvious rash, lesion, or ulcer.    LABORATORY PANEL:   CBC Recent Labs  Lab 09/21/18 0540  WBC 10.5  HGB 14.1  HCT 43.3  PLT 209   ------------------------------------------------------------------------------------------------------------------  Chemistries  Recent Labs  Lab 09/20/18 0713 09/21/18 0540  NA 141 137  K 3.7 4.1  CL 108 103  CO2 28 24  GLUCOSE 171* 395*  BUN 25* 37*  CREATININE 1.89* 2.66*  CALCIUM 8.4* 8.2*  AST 31  --   ALT 26  --   ALKPHOS 81  --   BILITOT 0.6  --    ------------------------------------------------------------------------------------------------------------------  Cardiac Enzymes Recent Labs  Lab 09/21/18 0540  TROPONINI 0.03*   ------------------------------------------------------------------------------------------------------------------  RADIOLOGY:  Dg Chest 2 View  Result Date: 09/20/2018 CLINICAL DATA:  73 year old male status post syncope and fall. Chest wall pain. EXAM: CHEST - 2 VIEW COMPARISON:  Chest radiographs 08/14/2018 and earlier. FINDINGS: Chronically increased AP dimension to the chest and elevated anterior diaphragm giving the appearance of low lung volumes on the AP view. Evidence of new small pleural effusions on the lateral. Trace fluid in the right minor fissure. No pneumothorax or pulmonary edema. Respiratory motion artifact on the lateral. No confluent pulmonary opacity. Stable cardiac size and mediastinal contours. Flowing osteophytes or syndesmophytes throughout the thoracic spine with evidence of ankylosis. No displaced rib fracture identified. Nonobstructed visible bowel gas pattern. IMPRESSION: 1. New small pleural effusion(s) since February. 2. No other acute pulmonary process or acute traumatic injury identified. Electronically Signed   By:  Lemmie Evens  [place] Crane M.D.   On: 09/20/2018 08:01   Ct Head Wo Contrast  Result Date: 09/20/2018 CLINICAL DATA:  Fall.  Trauma EXAM: CT HEAD WITHOUT CONTRAST CT MAXILLOFACIAL WITHOUT CONTRAST CT CERVICAL SPINE WITHOUT CONTRAST TECHNIQUE: Multidetector CT imaging of the head, cervical spine, and maxillofacial structures were performed using the standard protocol without intravenous contrast. Multiplanar CT image reconstructions of the cervical spine and maxillofacial structures were also generated. COMPARISON:  None. FINDINGS: CT HEAD FINDINGS Brain: Mild atrophy with mild chronic microvascular ischemic type changes in the white matter. No acute infarct, hemorrhage, mass. No fluid collection or midline shift. Vascular: Negative for hyperdense vessel Skull: Negative for fracture Other: Negative CT MAXILLOFACIAL FINDINGS Osseous: Negative for facial fracture Orbits: Bilateral periorbital edema. Bilateral cataract surgery. No orbital mass. Sinuses: Mild mucosal edema in the paranasal sinuses without air-fluid level. Soft tissues: Negative for soft tissue mass. CT CERVICAL SPINE FINDINGS Alignment: 2 mm anterolisthesis C4-5.  Mild levoscoliosis Skull base and vertebrae: Negative for fracture Soft tissues and spinal canal: Negative for soft tissue mass. Carotid artery calcification bilaterally Disc levels: Advanced disc and facet degeneration throughout the cervical spine. Foraminal encroachment bilaterally at C3-4, C4-5, C5-6, C6-7. Foraminal narrowing is severe on the right at C3-4 C4-5 and C5-6 and C6-7. Upper chest: Negative Other: None IMPRESSION: 1. No acute intracranial abnormality. Atrophy and chronic microvascular ischemia 2. Negative for facial fracture 3. Advanced cervical spondylosis. Negative for cervical spine fracture. Electronically Signed   By: Franchot Gallo M.D.   On: 09/20/2018 08:20   Ct Chest Wo Contrast  Result Date: 09/20/2018 CLINICAL DATA:  73 year old male with history of trauma from a fall. Injury to  chest. EXAM: CT CHEST WITHOUT CONTRAST TECHNIQUE: Multidetector CT imaging of the chest was performed following the standard protocol without IV contrast. COMPARISON:  No priors. FINDINGS: Cardiovascular: Heart size is mildly enlarged. There is no significant pericardial fluid, thickening or pericardial calcification. There is aortic atherosclerosis, as well as atherosclerosis of the great vessels of the mediastinum and the coronary arteries, including calcified atherosclerotic plaque in the left main, left anterior descending, left circumflex and right coronary arteries. Mild calcification of the aortic valve. Moderate calcification of the mitral annulus. Mediastinum/Nodes: No pathologically enlarged mediastinal or hilar lymph nodes. Please note that accurate exclusion of hilar adenopathy is limited on noncontrast CT scans. Esophagus is unremarkable in appearance. No axillary lymphadenopathy. Lungs/Pleura: No pneumothorax. Elevation of the right hemidiaphragm, which appears similar to prior CT the abdomen and pelvis 11/23/2016. Trace bilateral pleural effusions lying dependently. Areas of linear scarring are noted throughout the lung bases bilaterally, similar to the prior study. In addition, there are areas of ground-glass attenuation, septal thickening, thickening of the peribronchovascular interstitium and regional areas of architectural distortion scattered throughout the lung bases bilaterally, concerning for interstitial lung disease. No frank honeycombing noted. No acute consolidative airspace disease. No pleural effusions. No suspicious appearing pulmonary nodules or masses are noted. Upper Abdomen: Well-defined 3.2 x 3.2 cm low-attenuation (8 HU) left adrenal nodule, similar to the prior examination, compatible with an adenoma. Surgical clips in the upper right retroperitoneum. Musculoskeletal: There are no aggressive appearing lytic or blastic lesions noted in the visualized portions of the skeleton.  IMPRESSION: 1. No evidence of significant acute traumatic injury to the thorax. 2. There is a spectrum of findings in the lungs concerning for interstitial lung disease, considered indeterminate for usual interstitial pneumonia (UIP) per current ATS guidelines. Outpatient referral to Pulmonology for further evaluation is suggested in the near  future. Additionally, follow-up high-resolution chest CT is recommended in 12 months to assess for temporal changes in the appearance of the lung parenchyma. 3. Trace bilateral pleural effusions lying dependently. 4. Aortic atherosclerosis, in addition to left main and 3 vessel coronary artery disease. Please note that although the presence of coronary artery calcium documents the presence of coronary artery disease, the severity of this disease and any potential stenosis cannot be assessed on this non-gated CT examination. Assessment for potential risk factor modification, dietary therapy or pharmacologic therapy may be warranted, if clinically indicated. 5. There are calcifications of the aortic valve and mitral annulus. Echocardiographic correlation for evaluation of potential valvular dysfunction may be warranted if clinically indicated. 6. Mild cardiomegaly. Aortic Atherosclerosis (ICD10-I70.0). Electronically Signed   By: Vinnie Langton M.D.   On: 09/20/2018 08:54   Ct Cervical Spine Wo Contrast  Result Date: 09/20/2018 CLINICAL DATA:  Fall.  Trauma EXAM: CT HEAD WITHOUT CONTRAST CT MAXILLOFACIAL WITHOUT CONTRAST CT CERVICAL SPINE WITHOUT CONTRAST TECHNIQUE: Multidetector CT imaging of the head, cervical spine, and maxillofacial structures were performed using the standard protocol without intravenous contrast. Multiplanar CT image reconstructions of the cervical spine and maxillofacial structures were also generated. COMPARISON:  None. FINDINGS: CT HEAD FINDINGS Brain: Mild atrophy with mild chronic microvascular ischemic type changes in the white matter. No acute  infarct, hemorrhage, mass. No fluid collection or midline shift. Vascular: Negative for hyperdense vessel Skull: Negative for fracture Other: Negative CT MAXILLOFACIAL FINDINGS Osseous: Negative for facial fracture Orbits: Bilateral periorbital edema. Bilateral cataract surgery. No orbital mass. Sinuses: Mild mucosal edema in the paranasal sinuses without air-fluid level. Soft tissues: Negative for soft tissue mass. CT CERVICAL SPINE FINDINGS Alignment: 2 mm anterolisthesis C4-5.  Mild levoscoliosis Skull base and vertebrae: Negative for fracture Soft tissues and spinal canal: Negative for soft tissue mass. Carotid artery calcification bilaterally Disc levels: Advanced disc and facet degeneration throughout the cervical spine. Foraminal encroachment bilaterally at C3-4, C4-5, C5-6, C6-7. Foraminal narrowing is severe on the right at C3-4 C4-5 and C5-6 and C6-7. Upper chest: Negative Other: None IMPRESSION: 1. No acute intracranial abnormality. Atrophy and chronic microvascular ischemia 2. Negative for facial fracture 3. Advanced cervical spondylosis. Negative for cervical spine fracture. Electronically Signed   By: Franchot Gallo M.D.   On: 09/20/2018 08:20   US Carotid Bilateral  Result Date: 09/20/2018 CLINICAL DATA:  CVA EXAM: BILATERAL CAROTID DUPLEX ULTRASOUND TECHNIQUE: Pearline Cables scale imaging, color Doppler and duplex ultrasound were performed of bilateral carotid and vertebral arteries in the neck. COMPARISON:  None. FINDINGS: Criteria: Quantification of carotid stenosis is based on velocity parameters that correlate the residual internal carotid diameter with NASCET-based stenosis levels, using the diameter of the distal internal carotid lumen as the denominator for stenosis measurement. The following velocity measurements were obtained: RIGHT ICA: 90 cm/sec CCA: 77 cm/sec SYSTOLIC ICA/CCA RATIO:  1.2 ECA: 106 cm/sec LEFT ICA: 101 cm/sec CCA: 67 cm/sec SYSTOLIC ICA/CCA RATIO:  1.5 ECA: 97 cm/sec RIGHT  CAROTID ARTERY: Moderate irregular calcified plaque in the bulb. Low resistance internal carotid Doppler pattern is preserved. RIGHT VERTEBRAL ARTERY:  Antegrade. LEFT CAROTID ARTERY: Mild irregular calcified plaque in the bulb. Low resistance internal carotid Doppler pattern is preserved. LEFT VERTEBRAL ARTERY:  Antegrade. IMPRESSION: Less than 50% stenosis in the right and left internal carotid arteries. Electronically Signed   By: Marybelle Killings M.D.   On: 09/20/2018 14:44   Ct Maxillofacial Wo Contrast  Result Date: 09/20/2018 CLINICAL DATA:  Fall.  Trauma  EXAM: CT HEAD WITHOUT CONTRAST CT MAXILLOFACIAL WITHOUT CONTRAST CT CERVICAL SPINE WITHOUT CONTRAST TECHNIQUE: Multidetector CT imaging of the head, cervical spine, and maxillofacial structures were performed using the standard protocol without intravenous contrast. Multiplanar CT image reconstructions of the cervical spine and maxillofacial structures were also generated. COMPARISON:  None. FINDINGS: CT HEAD FINDINGS Brain: Mild atrophy with mild chronic microvascular ischemic type changes in the white matter. No acute infarct, hemorrhage, mass. No fluid collection or midline shift. Vascular: Negative for hyperdense vessel Skull: Negative for fracture Other: Negative CT MAXILLOFACIAL FINDINGS Osseous: Negative for facial fracture Orbits: Bilateral periorbital edema. Bilateral cataract surgery. No orbital mass. Sinuses: Mild mucosal edema in the paranasal sinuses without air-fluid level. Soft tissues: Negative for soft tissue mass. CT CERVICAL SPINE FINDINGS Alignment: 2 mm anterolisthesis C4-5.  Mild levoscoliosis Skull base and vertebrae: Negative for fracture Soft tissues and spinal canal: Negative for soft tissue mass. Carotid artery calcification bilaterally Disc levels: Advanced disc and facet degeneration throughout the cervical spine. Foraminal encroachment bilaterally at C3-4, C4-5, C5-6, C6-7. Foraminal narrowing is severe on the right at C3-4 C4-5  and C5-6 and C6-7. Upper chest: Negative Other: None IMPRESSION: 1. No acute intracranial abnormality. Atrophy and chronic microvascular ischemia 2. Negative for facial fracture 3. Advanced cervical spondylosis. Negative for cervical spine fracture. Electronically Signed   By: Franchot Gallo M.D.   On: 09/20/2018 08:20    EKG:   Orders placed or performed during the hospital encounter of 09/20/18  . EKG 12-Lead  . EKG 12-Lead    ASSESSMENT AND PLAN:    73 year old male with history of chronic diastolic heart failure, diabetes, OSA on CPAP who presents after syncopal event.  # Syncope of unclear etiology Continue to monitor troponins and telemetry Cardiology consultation placed via epic Order carotid Doppler and orthostatic vital signs. -PT eval Check orthostatics  #.  Acute on chronic diastolic heart failure: Hold torsemide and other diuretics  Follow intake and output with daily weight CHF clinic upon discharge Echocardiogram ordered [facility] cardiology is following  #.  Mildly elevated troponin: This is due to demand ischemia Follow telemetry and troponins. -Troponin 0 0.06-0.03 patient is asymptomatic at this time Echo if indicated by cardiology  4.  Acute kidney injury on chronic kidney disease stage III Baseline creatinine at 1.8 Today's creatinine is 2.66, patient has received IV Solu-Medrol 125 mg once in the ED and  IV Lasix which might be reason for worsening of renal function Lovenox SQ changed to heparin Avoid nephrotoxins   # Diabetes: Continue outpatient regimen ADA diet with sliding scale  #.  OSA: CPAP at night  # CAD: Continue Plavix, aspirin, statin  #.  Essential hypertension: Continue Norvasc, Coreg, clonidine, isosorbide  #.  BPH: Continue Flomax    All the records are reviewed and case discussed with Care Management/Social Workerr. Management plans discussed with the patient, wife and they are in agreement.  CODE STATUS: fc  TOTAL  TIME TAKING CARE OF THIS PATIENT: 37  minutes.   POSSIBLE D/C IN 1-2 DAYS, DEPENDING ON CLINICAL CONDITION.  Note: This dictation was prepared with Dragon dictation along with smaller phrase technology. Any transcriptional errors that result from this process are unintentional.   Nicholes Mango M.D on 09/21/2018 at 12:32 PM  Between 7am to 6pm - Pager - [phone] After 6pm go to [email or web address] - password [email or web address] [facility]  Office  [phone]  CC: Primary care physician; [name], MD

## 2018-09-21 NOTE — Progress Notes (Signed)
Orthostatic BP's positive.  Lying: 152/64, HR 54.  Sitting: 139/64 HR 55. Standing 119/64 HR 55.  Standing for 3 minutes. 129/69 HR 61

## 2018-09-21 NOTE — Progress Notes (Signed)
Progress Note  Patient Name: Stephen [name] Date of Encounter: 09/21/2018  Primary Cardiologist: Ida Rogue, MD   Subjective   Resting comfortably Discomfort in his nose, certain areas of his face following trauma falling to drywall Reports he is not wearing oxygen for long stretches at home such as walking around As on his last clinic visit reported having oxygen saturations down to 80% walking around on room air Wife reports saturations even down to high 70s -Events from yesterday was sleeping in a recliner without his CPAP, got up and walking around the house without oxygen, had syncope, fell to drywall headfirst Wife heard the noise and got up to help him -She reports oxygen saturations slightly worse following upper respiratory tract infection last month  Received IV Lasix yesterday and steroids  Inpatient Medications    Scheduled Meds:  amLODipine  5 mg Oral Daily   aspirin  324 mg Oral Daily   atorvastatin  80 mg Oral QHS   carvedilol  25 mg Oral BID WC   cloNIDine  0.3 mg Oral TID   clopidogrel  75 mg Oral Daily   diphenhydrAMINE  50 mg Oral QHS   erythromycin   Left Eye Q6H   ezetimibe  10 mg Oral Daily   gabapentin  800 mg Oral q morning - 10a   gabapentin  1,200 mg Oral QHS   heparin injection (subcutaneous)  5,000 Units Subcutaneous Q8H   insulin aspart  0-9 Units Subcutaneous TID AC & HS   insulin aspart protamine- aspart  35 Units Subcutaneous BID WC   isosorbide mononitrate  90 mg Oral BID   pantoprazole  40 mg Oral Daily   potassium chloride  10 mEq Oral Daily   sodium chloride flush  3 mL Intravenous Q12H   tamsulosin  0.4 mg Oral QHS   Continuous Infusions:  sodium chloride     PRN Meds: sodium chloride, acetaminophen **OR** acetaminophen, albuterol, docusate sodium, HYDROcodone-acetaminophen, ondansetron **OR** ondansetron (ZOFRAN) IV, polyethylene glycol   Vital Signs    Vitals:   09/20/18 1900 09/20/18 1944 09/21/18  0415 09/21/18 0721  BP:  (!) 148/66 110/61 134/68  Pulse:  62 (!) 57 (!) 54  Resp: 20 20 20 19   Temp:  98.6 F (37 C) 97.8 F (36.6 C) (!) 97.4 F (36.3 C)  TempSrc:  Oral Oral Oral  SpO2: 97% 95% 100% 96%  Weight:      Height:        Intake/Output Summary (Last 24 hours) at 09/21/2018 1427 Last data filed at 09/21/2018 0000 Gross per 24 hour  Intake --  Output 900 ml  Net -900 ml   Last 3 Weights 09/20/2018 09/16/2018 08/21/2018  Weight (lbs) 263 lb 262 lb 270 lb 4 oz  Weight (kg) 119.296 kg 118.842 kg 122.585 kg      Telemetry    Normal sinus rhythm- Personally Reviewed  ECG     - Personally Reviewed  Physical Exam   GEN: No acute distress.  On nasal cannula oxygen Neck: No JVD Cardiac: RRR, no murmurs, rubs, or gallops.  Respiratory: Scattered Rales otherwise clear GI: Soft, nontender, non-distended  MS: 1+ pitting lower extremity edema  No deformity. Neuro:  Nonfocal  Psych: Normal affect   Labs    Chemistry Recent Labs  Lab 09/20/18 0713 09/21/18 0540  NA 141 137  K 3.7 4.1  CL 108 103  CO2 28 24  GLUCOSE 171* 395*  BUN 25* 37*  CREATININE 1.89* 2.66*  CALCIUM 8.4* 8.2*  PROT 6.5  --   ALBUMIN 2.7*  --   AST 31  --   ALT 26  --   ALKPHOS 81  --   BILITOT 0.6  --   GFRNONAA 35* 23*  GFRAA 41* 27*  ANIONGAP 5 10     Hematology Recent Labs  Lab 09/20/18 0713 09/21/18 0540  WBC 13.8* 10.5  RBC 5.27 4.94  HGB 15.0 14.1  HCT 46.3 43.3  MCV 87.9 87.7  MCH 28.5 28.5  MCHC 32.4 32.6  RDW 14.7 14.6  PLT 195 209    Cardiac Enzymes Recent Labs  Lab 09/20/18 0713 09/20/18 1808 09/21/18 0540  TROPONINI 0.04* 0.06* 0.03*   No results for input(s): TROPIPOC in the last 168 hours.   BNP Recent Labs  Lab 09/20/18 0713  BNP 149.0*     DDimer No results for input(s): DDIMER in the last 168 hours.   Radiology    Dg Chest 2 View  Result Date: 09/20/2018 CLINICAL DATA:  73 year old male status post syncope and fall. Chest wall  pain. EXAM: CHEST - 2 VIEW COMPARISON:  Chest radiographs 08/14/2018 and earlier. FINDINGS: Chronically increased AP dimension to the chest and elevated anterior diaphragm giving the appearance of low lung volumes on the AP view. Evidence of new small pleural effusions on the lateral. Trace fluid in the right minor fissure. No pneumothorax or pulmonary edema. Respiratory motion artifact on the lateral. No confluent pulmonary opacity. Stable cardiac size and mediastinal contours. Flowing osteophytes or syndesmophytes throughout the thoracic spine with evidence of ankylosis. No displaced rib fracture identified. Nonobstructed visible bowel gas pattern. IMPRESSION: 1. New small pleural effusion(s) since February. 2. No other acute pulmonary process or acute traumatic injury identified. Electronically Signed   By: Genevie Ann M.D.   On: 09/20/2018 08:01   Ct Head Wo Contrast  Result Date: 09/20/2018 CLINICAL DATA:  Fall.  Trauma EXAM: CT HEAD WITHOUT CONTRAST CT MAXILLOFACIAL WITHOUT CONTRAST CT CERVICAL SPINE WITHOUT CONTRAST TECHNIQUE: Multidetector CT imaging of the head, cervical spine, and maxillofacial structures were performed using the standard protocol without intravenous contrast. Multiplanar CT image reconstructions of the cervical spine and maxillofacial structures were also generated. COMPARISON:  None. FINDINGS: CT HEAD FINDINGS Brain: Mild atrophy with mild chronic microvascular ischemic type changes in the white matter. No acute infarct, hemorrhage, mass. No fluid collection or midline shift. Vascular: Negative for hyperdense vessel Skull: Negative for fracture Other: Negative CT MAXILLOFACIAL FINDINGS Osseous: Negative for facial fracture Orbits: Bilateral periorbital edema. Bilateral cataract surgery. No orbital mass. Sinuses: Mild mucosal edema in the paranasal sinuses without air-fluid level. Soft tissues: Negative for soft tissue mass. CT CERVICAL SPINE FINDINGS Alignment: 2 mm anterolisthesis C4-5.   Mild levoscoliosis Skull base and vertebrae: Negative for fracture Soft tissues and spinal canal: Negative for soft tissue mass. Carotid artery calcification bilaterally Disc levels: Advanced disc and facet degeneration throughout the cervical spine. Foraminal encroachment bilaterally at C3-4, C4-5, C5-6, C6-7. Foraminal narrowing is severe on the right at C3-4 C4-5 and C5-6 and C6-7. Upper chest: Negative Other: None IMPRESSION: 1. No acute intracranial abnormality. Atrophy and chronic microvascular ischemia 2. Negative for facial fracture 3. Advanced cervical spondylosis. Negative for cervical spine fracture. Electronically Signed   By: Franchot Gallo M.D.   On: 09/20/2018 08:20   Ct Chest Wo Contrast  Result Date: 09/20/2018 CLINICAL DATA:  73 year old male with history of trauma from a fall. Injury to chest. EXAM: CT CHEST WITHOUT CONTRAST TECHNIQUE:  Multidetector CT imaging of the chest was performed following the standard protocol without IV contrast. COMPARISON:  No priors. FINDINGS: Cardiovascular: Heart size is mildly enlarged. There is no significant pericardial fluid, thickening or pericardial calcification. There is aortic atherosclerosis, as well as atherosclerosis of the great vessels of the mediastinum and the coronary arteries, including calcified atherosclerotic plaque in the left main, left anterior descending, left circumflex and right coronary arteries. Mild calcification of the aortic valve. Moderate calcification of the mitral annulus. Mediastinum/Nodes: No pathologically enlarged mediastinal or hilar lymph nodes. Please note that accurate exclusion of hilar adenopathy is limited on noncontrast CT scans. Esophagus is unremarkable in appearance. No axillary lymphadenopathy. Lungs/Pleura: No pneumothorax. Elevation of the right hemidiaphragm, which appears similar to prior CT the abdomen and pelvis 11/23/2016. Trace bilateral pleural effusions lying dependently. Areas of linear scarring are  noted throughout the lung bases bilaterally, similar to the prior study. In addition, there are areas of ground-glass attenuation, septal thickening, thickening of the peribronchovascular interstitium and regional areas of architectural distortion scattered throughout the lung bases bilaterally, concerning for interstitial lung disease. No frank honeycombing noted. No acute consolidative airspace disease. No pleural effusions. No suspicious appearing pulmonary nodules or masses are noted. Upper Abdomen: Well-defined 3.2 x 3.2 cm low-attenuation (8 HU) left adrenal nodule, similar to the prior examination, compatible with an adenoma. Surgical clips in the upper right retroperitoneum. Musculoskeletal: There are no aggressive appearing lytic or blastic lesions noted in the visualized portions of the skeleton. IMPRESSION: 1. No evidence of significant acute traumatic injury to the thorax. 2. There is a spectrum of findings in the lungs concerning for interstitial lung disease, considered indeterminate for usual interstitial pneumonia (UIP) per current ATS guidelines. Outpatient referral to Pulmonology for further evaluation is suggested in the near future. Additionally, follow-up high-resolution chest CT is recommended in 12 months to assess for temporal changes in the appearance of the lung parenchyma. 3. Trace bilateral pleural effusions lying dependently. 4. Aortic atherosclerosis, in addition to left main and 3 vessel coronary artery disease. Please note that although the presence of coronary artery calcium documents the presence of coronary artery disease, the severity of this disease and any potential stenosis cannot be assessed on this non-gated CT examination. Assessment for potential risk factor modification, dietary therapy or pharmacologic therapy may be warranted, if clinically indicated. 5. There are calcifications of the aortic valve and mitral annulus. Echocardiographic correlation for evaluation of  potential valvular dysfunction may be warranted if clinically indicated. 6. Mild cardiomegaly. Aortic Atherosclerosis (ICD10-I70.0). Electronically Signed   By: Vinnie Langton M.D.   On: 09/20/2018 08:54   Ct Cervical Spine Wo Contrast  Result Date: 09/20/2018 CLINICAL DATA:  Fall.  Trauma EXAM: CT HEAD WITHOUT CONTRAST CT MAXILLOFACIAL WITHOUT CONTRAST CT CERVICAL SPINE WITHOUT CONTRAST TECHNIQUE: Multidetector CT imaging of the head, cervical spine, and maxillofacial structures were performed using the standard protocol without intravenous contrast. Multiplanar CT image reconstructions of the cervical spine and maxillofacial structures were also generated. COMPARISON:  None. FINDINGS: CT HEAD FINDINGS Brain: Mild atrophy with mild chronic microvascular ischemic type changes in the white matter. No acute infarct, hemorrhage, mass. No fluid collection or midline shift. Vascular: Negative for hyperdense vessel Skull: Negative for fracture Other: Negative CT MAXILLOFACIAL FINDINGS Osseous: Negative for facial fracture Orbits: Bilateral periorbital edema. Bilateral cataract surgery. No orbital mass. Sinuses: Mild mucosal edema in the paranasal sinuses without air-fluid level. Soft tissues: Negative for soft tissue mass. CT CERVICAL SPINE FINDINGS Alignment: 2 mm  anterolisthesis C4-5.  Mild levoscoliosis Skull base and vertebrae: Negative for fracture Soft tissues and spinal canal: Negative for soft tissue mass. Carotid artery calcification bilaterally Disc levels: Advanced disc and facet degeneration throughout the cervical spine. Foraminal encroachment bilaterally at C3-4, C4-5, C5-6, C6-7. Foraminal narrowing is severe on the right at C3-4 C4-5 and C5-6 and C6-7. Upper chest: Negative Other: None IMPRESSION: 1. No acute intracranial abnormality. Atrophy and chronic microvascular ischemia 2. Negative for facial fracture 3. Advanced cervical spondylosis. Negative for cervical spine fracture. Electronically Signed    By: Franchot Gallo M.D.   On: 09/20/2018 08:20   US Carotid Bilateral  Result Date: 09/20/2018 CLINICAL DATA:  CVA EXAM: BILATERAL CAROTID DUPLEX ULTRASOUND TECHNIQUE: Pearline Cables scale imaging, color Doppler and duplex ultrasound were performed of bilateral carotid and vertebral arteries in the neck. COMPARISON:  None. FINDINGS: Criteria: Quantification of carotid stenosis is based on velocity parameters that correlate the residual internal carotid diameter with NASCET-based stenosis levels, using the diameter of the distal internal carotid lumen as the denominator for stenosis measurement. The following velocity measurements were obtained: RIGHT ICA: 90 cm/sec CCA: 77 cm/sec SYSTOLIC ICA/CCA RATIO:  1.2 ECA: 106 cm/sec LEFT ICA: 101 cm/sec CCA: 67 cm/sec SYSTOLIC ICA/CCA RATIO:  1.5 ECA: 97 cm/sec RIGHT CAROTID ARTERY: Moderate irregular calcified plaque in the bulb. Low resistance internal carotid Doppler pattern is preserved. RIGHT VERTEBRAL ARTERY:  Antegrade. LEFT CAROTID ARTERY: Mild irregular calcified plaque in the bulb. Low resistance internal carotid Doppler pattern is preserved. LEFT VERTEBRAL ARTERY:  Antegrade. IMPRESSION: Less than 50% stenosis in the right and left internal carotid arteries. Electronically Signed   By: Marybelle Killings M.D.   On: 09/20/2018 14:44   Ct Maxillofacial Wo Contrast  Result Date: 09/20/2018 CLINICAL DATA:  Fall.  Trauma EXAM: CT HEAD WITHOUT CONTRAST CT MAXILLOFACIAL WITHOUT CONTRAST CT CERVICAL SPINE WITHOUT CONTRAST TECHNIQUE: Multidetector CT imaging of the head, cervical spine, and maxillofacial structures were performed using the standard protocol without intravenous contrast. Multiplanar CT image reconstructions of the cervical spine and maxillofacial structures were also generated. COMPARISON:  None. FINDINGS: CT HEAD FINDINGS Brain: Mild atrophy with mild chronic microvascular ischemic type changes in the white matter. No acute infarct, hemorrhage, mass. No fluid  collection or midline shift. Vascular: Negative for hyperdense vessel Skull: Negative for fracture Other: Negative CT MAXILLOFACIAL FINDINGS Osseous: Negative for facial fracture Orbits: Bilateral periorbital edema. Bilateral cataract surgery. No orbital mass. Sinuses: Mild mucosal edema in the paranasal sinuses without air-fluid level. Soft tissues: Negative for soft tissue mass. CT CERVICAL SPINE FINDINGS Alignment: 2 mm anterolisthesis C4-5.  Mild levoscoliosis Skull base and vertebrae: Negative for fracture Soft tissues and spinal canal: Negative for soft tissue mass. Carotid artery calcification bilaterally Disc levels: Advanced disc and facet degeneration throughout the cervical spine. Foraminal encroachment bilaterally at C3-4, C4-5, C5-6, C6-7. Foraminal narrowing is severe on the right at C3-4 C4-5 and C5-6 and C6-7. Upper chest: Negative Other: None IMPRESSION: 1. No acute intracranial abnormality. Atrophy and chronic microvascular ischemia 2. Negative for facial fracture 3. Advanced cervical spondylosis. Negative for cervical spine fracture. Electronically Signed   By: Franchot Gallo M.D.   On: 09/20/2018 08:20    Cardiac Studies   Echocardiogram reviewed personally by myself in the room Definity used for better visualization Ejection fraction estimated 50 percent, unable to exclude regional wall motion abnormalities Moderately elevated right heart pressures 45+ RA pressure, likely around 50 total    Patient Profile     Mr.  Karaffa is a 73 year old man with history of CAD status post PCI to the LCx and RCA, HTN, HLD, left renal artery stenosis, pulmonary fibrosis, pulmonary hypertension, diastolic heart failure, DM, and CKD stage III, whom we have been asked to see due to syncope.  Assessment & Plan    1.  Syncope Likely secondary to hypoxia Acknowledges that he walks around his house without his oxygen in place with saturations down to 80. Wife reports even recording high  70s Unclear why he is not wearing oxygen on a consistent basis which was recommended on his last clinic visit --No further cardiac work-up needed, unchanged ejection fraction on echo No ischemic work-up needed We will continue to monitor telemetry for arrhythmia  2.  Hypoxia Multifactorial including interstitial lung disease, suggestion of pulmonary hypertension on this echocardiogram today He appears prerenal after IV Lasix yesterday and right heart pressure continues to run 50 mm per mercury -Discussed pulmonary hypertension diagnosis with him -If he is still inpatient next week could consider right heart catheterization -He has indicated he would like to go home today or tomorrow -Strongly recommended oxygen at all times patient on ambulation around his house -Recommended he wear CPAP when sleeping in his recliner watching late movies   3) pulmonary hypertension Likely secondary to underlying lung disease Right heart pressure 50 mmHg  on echocardiogram High pressure even after IV Lasix yesterday causing prerenal state creatinine 2.5 well above his baseline -Would hold diuretics until renal function improved -Ideally needs referral to pulmonary hypertension clinic and right heart catheterization This may take some time given limitations on nonessential testing and limitations on outpatient clinic appointments in the setting of Covid 19 outbreak -Suggest we start sildenafil 20 mg 3 times daily for now.  If no improvement could wean off the medication as outpatient -Oxygen at all times CPAP when sleeping in his recliner and when sleeping in the bed -Difficulty losing weight in the past  4) acute on chronic renal failure Given large dose IV Lasix yesterday with worsening renal function Current admission is not secondary to diastolic heart failure Would hold Lasix, restart torsemide once renal function back to baseline  Long discussion with him today concerning above, wife at the  bedside, all questions answered Discussed pulmonary hypertension, echocardiogram findings with him in the room as it was being done Discussed hypoxia, recent syncope, management  Total encounter time more than 45 minutes  Greater than 50% was spent in counseling and coordination of care with the patient   For questions or updates, please contact [place] Please consult [email or web address] for contact info under        Signed, Ida Rogue, MD  09/21/2018, 2:27 PM

## 2018-09-21 NOTE — Evaluation (Signed)
Physical Therapy Evaluation Patient Details Name: Stephen [name] MRN: [id number] DOB: August 16, 1945 Today's Date: 09/21/2018   History of Present Illness  Patient is a pleasant 73 year old male who presents s/p fall from syncopal event. Patient fell on face after walking out of the bathroom. Currently is supposed to wear CPAP at night and oxygen during the day.  PMH includes: CAD, CKD, DM, CHF, HTN, OSA, MI, and PF.   Clinical Impression  Patient is a pleasant 72 year old male who presents s/p fall. Patient and wife educated on need to wear oxygen to keep SP02 levels in functional range. Educated on measuring Sp02 prior to standing and ambulating to ensure oxygen remains above 90%. Patient is currently on 3L O2 via nasal cannula and demonstrated ability to ambulate around nursing station and back to room while pushing oxygen safely. Patient is back to baseline per patient and patient's wife and, as long as patient is compliant with wearing oxygen does not need further physical therapy.      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 Monitor SP02  Restrictions Weight Bearing Restrictions: No      Mobility  Bed Mobility Overal bed mobility: Independent             General bed mobility comments: Monitor Sp02 3L 02 via nasal cannula. Independent rolling, SPo2>91.   Transfers Overall transfer level: Independent               General transfer comment: Monitor SP02 with 3L 02 via nasal cannula. Sit to stand, required 60 seconds sitting EOB to raise SP02 to >90 from 88 prior to standing.   Ambulation/Gait Ambulation/Gait assistance: Supervision;Min guard Gait Distance (Feet): 170 Feet   Gait Pattern/deviations: WFL(Within Functional Limits) Gait velocity: >1.27m/s    General  Gait Details: pushing o2 tank while ambulating with 3L 02, required one verbal cue for breathing through nose to increase Sp02 from 89 to above 90.  Stairs            Wheelchair Mobility    Modified Rankin (Stroke Patients Only)       Balance Overall balance assessment: Modified Independent;History of Falls Sitting-balance support: Feet unsupported Sitting balance-Leahy Scale: Normal Sitting balance - Comments: reach inside and outside BOS without LOB   Standing balance support: No upper extremity supported;During functional activity Standing balance-Leahy Scale: Good Standing balance comment: Pushes oxygen with ambulation. static stand without UE support without LOB.                              Pertinent Vitals/Pain Pain Assessment: No/denies pain    Home Living Family/patient expects to be discharged to:: Private residence Living Arrangements: Spouse/significant other Available Help at Discharge: Family Type of Home: House Home Access: [place]: One [place]: Shower seat;Grab bars - tub/shower;Grab bars - toilet;Other (comment)(shower seat in storage, pulse ox) Additional Comments: Patient is supposed to wear oxygen at home for 2L via nasal cannula and CPAP at night. Does not use assistive devices at baseline and is very active.     Prior Function Level of Independence: Independent         Comments: Very active per patient and patient's wife report. Doesn't  like to wear his oxygen despite knowing he is supposed to.      Hand Dominance        Extremity/Trunk Assessment   Upper Extremity Assessment Upper Extremity Assessment: Overall WFL for tasks assessed    Lower Extremity Assessment Lower Extremity Assessment: Overall WFL for tasks assessed(Gross 4+/5 bilateral LE's.  No light touch sensation deficits. )    Cervical / Trunk Assessment Cervical / Trunk Assessment: Normal  Communication    Communication: No difficulties  Cognition Arousal/Alertness: Awake/alert Behavior During Therapy: WFL for tasks assessed/performed Overall Cognitive Status: Within Functional Limits for tasks assessed                                 General Comments: Patient awake and oriented x 4. Agreeable to PT.       General Comments General comments (skin integrity, edema, etc.): noted edema of LLE>RLE.     Exercises Other Exercises Other Exercises: seated EOB: ankle pumps 20 Other Exercises: education on need to wear oxygen at all times to keep Sp02>90. Educated on checking Sp02 prior to standing to ensure levels of 90 and above. Do not stand if less than 90.  Other Exercises: Ambulate around nursing station with supervision.    Assessment/Plan    PT Assessment Patent does not need any further PT services  PT Problem List         PT Treatment Interventions      PT Goals (Current goals can be found in the Care Plan section)  Acute Rehab PT Goals Patient Stated Goal: to return home PT Goal Formulation: With patient Time For Goal Achievement: 10/05/18 Potential to Achieve Goals: Good    Frequency     Barriers to discharge        Co-evaluation               AM-PAC PT "6 Clicks" Mobility  Outcome Measure Help needed turning from your back to your side while in a flat bed without using bedrails?: None Help needed moving from lying on your back to sitting on the side of a flat bed without using bedrails?: None Help needed moving to and from a bed to a chair (including a wheelchair)?: None Help needed standing up from a chair using your arms (e.g., wheelchair or bedside chair)?: None Help needed to walk in hospital room?: None Help needed climbing 3-5 steps with a railing? : A Little 6 Click Score: 23    End of Session Equipment Utilized During Treatment: Gait belt;Oxygen(3L 02 via nasal cannula ) Activity Tolerance: Patient tolerated treatment well Patient  left: in bed;with family/visitor present;with call bell/phone within reach Nurse Communication: Mobility status      Time: 0255-0320 PT Time Calculation (min) (ACUTE ONLY): 25 min   Charges:   PT Evaluation $PT Eval Low Complexity: 1 Low PT Treatments $Gait Training: 8-22 mins        Janna Arch, PT, DPT    Janna Arch 09/21/2018, 3:37 PM

## 2018-09-21 NOTE — Plan of Care (Signed)
?  Problem: Clinical Measurements: ?Goal: Respiratory complications will improve ?Outcome: Progressing ?Goal: Cardiovascular complication will be avoided ?Outcome: Progressing ?  ?Problem: Safety: ?Goal: Ability to remain free from injury will improve ?Outcome: Progressing ?  ?

## 2018-09-21 NOTE — Progress Notes (Signed)
Inpatient Diabetes Program Recommendations  AACE/ADA: New Consensus Statement on Inpatient Glycemic Control (2015)  Target Ranges:  Prepandial:   less than 140 mg/dL      Peak postprandial:   less than 180 mg/dL (1-2 hours)      Critically ill patients:  140 - 180 mg/dL   Results for CHRISHAWN, BOLEY (MRN [id number]) as of 09/21/2018 08:59  Ref. Range 09/20/2018 10:26 09/20/2018 16:43 09/20/2018 21:45  Glucose-Capillary Latest Ref Range: 70 - 99 mg/dL 132 (H) 197 (H)  2 units NOVOLOG +  35 units 70/30 Insulin +  125 mg Solumedrol given at 5:49pm 277 (H)   Results for KESHAUN, DUBEY (MRN [id number]) as of 09/21/2018 08:59  Ref. Range 09/21/2018 07:23  Glucose-Capillary Latest Ref Range: 70 - 99 mg/dL 347 (H)  7 units NOVOLOG +  35 units 70/30 Insulin     Home DM Meds: 70/30 Insulin: 70 units AM/ 50 units QHS  Current Orders: 70/30 Insulin: 35 units BID      Novolog Sensitive Correction Scale/ SSI (0-9 units) TID AC      MD- Note that patient received one time dose of Solumedrol 125 mg yesterday at 5:49 pm.  This may have contributed to his extreme hyperglycemia this AM.  The hyperglycemic effects of the Solumedrol should be lessening as the day progresses.  If you note that CBGs remain elevated, may consider the following:  Increase 70/30 Insulin to 42 units BID (20% overall increase of the total dose)    --Will follow patient during hospitalization--  Wyn Quaker RN, MSN, CDE Diabetes Coordinator Inpatient Glycemic Control Team Team Pager: [phone] (8a-5p)

## 2018-09-22 ENCOUNTER — Other Ambulatory Visit: Payer: Self-pay | Admitting: Cardiovascular Disease

## 2018-09-22 LAB — BASIC METABOLIC PANEL
Anion gap: 8 (ref 5–15)
BUN: 49 mg/dL — ABNORMAL HIGH (ref 8–23)
CO2: 24 mmol/L (ref 22–32)
Calcium: 8.2 mg/dL — ABNORMAL LOW (ref 8.9–10.3)
Chloride: 104 mmol/L (ref 98–111)
Creatinine, Ser: 2.61 mg/dL — ABNORMAL HIGH (ref 0.61–1.24)
GFR calc Af Amer: 28 mL/min — ABNORMAL LOW (ref >60)
GFR calc non Af Amer: 24 mL/min — ABNORMAL LOW (ref >60)
Glucose, Bld: 381 mg/dL — ABNORMAL HIGH (ref 70–99)
Potassium: 3.9 mmol/L (ref 3.5–5.1)
Sodium: 136 mmol/L (ref 135–145)

## 2018-09-22 LAB — GLUCOSE, CAPILLARY
Glucose-Capillary: 358 mg/dL — ABNORMAL HIGH (ref 70–99)
Glucose-Capillary: 442 mg/dL — ABNORMAL HIGH (ref 70–99)
Glucose-Capillary: 381 mg/dL — ABNORMAL HIGH (ref 70–99)

## 2018-09-22 MED ORDER — INSULIN REGULAR HUMAN 100 UNIT/ML IJ SOLN
15.0000 [IU] | Freq: Once | INTRAMUSCULAR | Status: AC
  Administered 2018-09-22: 15 [IU] via SUBCUTANEOUS
  Filled 2018-09-22: qty 10

## 2018-09-22 MED ORDER — SODIUM CHLORIDE 0.9 % IV SOLN
INTRAVENOUS | Status: DC
  Administered 2018-09-22: 11:00:00 via INTRAVENOUS

## 2018-09-22 MED ORDER — SODIUM CHLORIDE 0.9 % IV SOLN
INTRAVENOUS | Status: AC
  Administered 2018-09-22: 13:00:00 via INTRAVENOUS

## 2018-09-22 MED ORDER — CARVEDILOL 12.5 MG PO TABS: 12.5000 mg | ORAL_TABLET | Freq: Two times a day (BID) | ORAL | 0 refills | Status: DC

## 2018-09-22 MED ORDER — ACETAMINOPHEN 325 MG PO TABS: 650.0000 mg | ORAL_TABLET | Freq: Four times a day (QID) | ORAL | Status: DC | PRN

## 2018-09-22 MED ORDER — INSULIN ASPART PROT & ASPART (70-30 MIX) 100 UNIT/ML ~~LOC~~ SUSP: 70.0000 [IU] | Freq: Every day | SUBCUTANEOUS | Status: DC

## 2018-09-22 MED ORDER — ERYTHROMYCIN 5 MG/GM OP OINT: TOPICAL_OINTMENT | Freq: Four times a day (QID) | OPHTHALMIC | 0 refills | Status: DC

## 2018-09-22 MED ORDER — CARVEDILOL 12.5 MG PO TABS: 12.5000 mg | ORAL_TABLET | Freq: Two times a day (BID) | ORAL | 3 refills | Status: DC

## 2018-09-22 MED ORDER — INSULIN ASPART PROT & ASPART (70-30 MIX) 100 UNIT/ML ~~LOC~~ SUSP: 50.0000 [IU] | Freq: Two times a day (BID) | SUBCUTANEOUS | Status: DC

## 2018-09-22 MED ORDER — INSULIN ASPART PROT & ASPART (70-30 MIX) 100 UNIT/ML ~~LOC~~ SUSP: 50.0000 [IU] | Freq: Every day | SUBCUTANEOUS | Status: DC

## 2018-09-22 MED ORDER — SILDENAFIL CITRATE 20 MG PO TABS: 20.0000 mg | ORAL_TABLET | Freq: Three times a day (TID) | ORAL | 0 refills | Status: DC

## 2018-09-22 MED ORDER — INSULIN DETEMIR 100 UNIT/ML ~~LOC~~ SOLN
10.0000 [IU] | Freq: Once | SUBCUTANEOUS | Status: AC
  Administered 2018-09-22: 10 [IU] via SUBCUTANEOUS
  Filled 2018-09-22: qty 0.1

## 2018-09-22 MED ORDER — CARVEDILOL 12.5 MG PO TABS: 12.5000 mg | ORAL_TABLET | Freq: Two times a day (BID) | ORAL | Status: DC

## 2018-09-22 MED ORDER — SILDENAFIL CITRATE 20 MG PO TABS
20.0000 mg | ORAL_TABLET | Freq: Three times a day (TID) | ORAL | Status: DC
  Administered 2018-09-22 (×2): 20 mg via ORAL
  Filled 2018-09-22 (×3): qty 1

## 2018-09-22 NOTE — Progress Notes (Signed)
Spoke with dr. Margaretmary Eddy regarding heart rate of 55 per md hold am dose of coreg 25mg 

## 2018-09-22 NOTE — Discharge Summary (Addendum)
[facility] NAME: Stephen [name]    MR#:  [id number]  DATE OF BIRTH:  1946-02-10  DATE OF ADMISSION:  09/20/2018 ADMITTING PHYSICIAN: Bettey Costa, MD  DATE OF DISCHARGE: 09/22/18 PRIMARY CARE PHYSICIAN: [name], MD    ADMISSION DIAGNOSIS:  Cerebral infarction [facility]) [I63.9] CVA (cerebral infarction) [I63.9] Acute on chronic respiratory failure with hypoxia (HCC) [J96.21] Syncope, unspecified syncope type [R55]  DISCHARGE DIAGNOSIS:   Syncope Orthostatic hypotension Hypoxia Possible pulmonary hypertension Noncompliance with oxygen and CPAP machine Chronic diastolic congestive heart failure SECONDARY DIAGNOSIS:   Past Medical History:  Diagnosis Date  . Adrenal gland anomaly   . Arthritis   . CAD (coronary artery disease)    a. 04/2006 MI and PCI/stenting to mLCx & mRCA; b. 07/2009 Cath: patent LCX/RCA stents;  c. 12/2016 NSTEMI/Cath: LM 30, LAD 30p, 25m 30d, D1/2/3 min irregs, LCX 1028mSR, OM1 min irregs, RCA 20p, 10 ISR, 7066m0d, RPDA min irregs, RPAV 40-->Med Rx; d. 12/2017 MV: fixed lateral wall scar, mild anterior/anterior septal ischemia. EF 30-44%.  . CKD (chronic kidney disease), stage III ([facility] . Colon polyps   . DM2 (diabetes mellitus, type 2) (HCC)    insulin requiring  . Dyslipidemia   . GERD (gastroesophageal reflux disease)   . History of echocardiogram    a. TTE 12/2016: EF 50-55%, mod concentric LVH, images inadequate for wall motion assessment, not technically sufficienct to allow for LV dias fxn, calcified mitral annulus   . HTN (hypertension)   . Kidney stones   . Left Renal artery stenosis ([facility]  a. 07/2013 s/p PTA/stenting ([place]); b. 07/2017 Renal Duplex: No significant RAS.  . MMarland Kitchenocardial infarction ([facility]CC[facility] . OSA (obstructive sleep apnea)   . Pulmonary fibrosis ([facility]  a. 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  . Recurrent UTI   .  Rotator cuff disorder    right  . Skin cancer    head  . Thyroid disorder   . Venous insufficiency of both lower extremities    a. s/p laser treatment.    HOSPITAL COURSE:  HPI  Stephen [name] a [age]o. male with a known history of CAD, chronic kidney disease stage III, diabetes, chronic diastolic heart failure who presents after a syncopal event.  Patient reports that he was walking out of the bathroom when he felt lightheaded and fell flat on his face.  Patient denies chest pain, shortness of breath, nausea or vomiting.  He has been in his usual state of health. He was brought to the ER for further evaluation. Imaging shows no acute fractures or CVA.  Patient also reports increased weight gain and lower extremity edema over the past week.  Patient was noted to have hypoxia while in the emergency room.  O2 sat was 86% on room air.  Patient is being admitted for syncopal event along with CHF exacerbation.  Hospital course  #Syncope secondary to orthostatic hypotension, hypoxia from noncompliance with home oxygen and CPAP machine -Hydrate with IV fluids-normal saline 150 mL/h-500 mL.  And okay to discharge patient after that per cardiology and nephrology.  Patient is insisting going home Continue to monitor troponins -0.04-0.06-0.03 Seen by cardiology Dr. GolRockey Situo further cardiac work-up needed Order carotid Doppler and orthostatic vital signs. -PT eval-no need of PT identified -coreg dose decreased to 12.5 mg BID per dr.Gollans recommendations -Reinforced the importance of  being compliant with his home oxygen and CPAP machine -carotid dopplers <50 % stenosis BL   #.  chronic diastolic heart failure: Hold torsemide and other diuretics as patient is dehydrated and orthostatic Outpatient follow-up with nephrology Dr. Zollie Scale on Thursday.  Okay to discharge patient from nephrology standpoint Follow intake and output with daily weight CHF clinic upon  discharge Echocardiogram 40 to 45% EF CM[facility]  recommends op f/u and ok to dc pt from cardiac standpoint coreg dose decreased to 12.5 mg BID per dr.Gollans recommendations   #Hypoxia secondary to multifactorial etiology; interstitial lung disease, possible pulmonary hypertension based on echocardiogram with right heart pressure 50 mmHg on echocardiogram and noncompliance Holding IV Lasix in view of acute renal insufficiency and orthostatic hypotension -Cardiology is recommending to start patient on sildenafil 20 mg 3 times daily -Continue home oxygen -CPAP nightly Outpatient follow-up with cardio for possible right heart catheterization to rule out pulmonary hypertension   #. Mildly elevated troponin: This is due to demand ischemia Follow telemetry and troponins. -Troponin 0 0.06-0.03 patient is asymptomatic at this time Echo if indicated by cardiology  Worsening acute kidney injury on chronic kidney disease stage III Baseline creatinine at 1.8 Today's creatinine is 2.66-2.61 patient has received IV Solu-Medrol 125 mg once in the ED and  IV Lasix which might be reason for worsening of renal function Lovenox SQ changed to heparin subcu Avoid nephrotoxins   #Diabetes: Continue outpatient regimen 70/30 -.  Patient takes 70 units in a.m. and 50 units nightly at home resume the same ADA diet with sliding scale  #. OSA: CPAP at night  #CAD: Continue Plavix, aspirin, statin  #. Essential hypertension: Continue Norvasc, Coreg, clonidine, isosorbide  #. BPH: Continue Flomax   DISCHARGE CONDITIONS:   Stable  CONSULTS OBTAINED:  Treatment Team:  Nelva Bush, MD   PROCEDURES none  DRUG ALLERGIES:   Allergies  Allergen Reactions  . Calcium Channel Blockers     Would avoid if possible due to h/o peripheral edema    DISCHARGE MEDICATIONS:   Allergies as of 09/22/2018      Reactions   Calcium Channel Blockers    Would avoid if possible due to h/o  peripheral edema      Medication List    STOP taking these medications   torsemide 20 MG tablet Commonly known as:  DEMADEX     TAKE these medications   Accu-Chek Aviva Plus w/Device Kit Use to check blood sugar three times daily and as needed.  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   acetaminophen 325 MG tablet Commonly known as:  TYLENOL Take 2 tablets (650 mg total) by mouth every 6 (six) hours as needed for mild pain (or Fever >/= 101).   albuterol 108 (90 Base) MCG/ACT inhaler Commonly known as:  PROVENTIL HFA;VENTOLIN HFA Inhale 2 puffs into the lungs every 6 (six) hours as needed for wheezing or shortness of breath.   amLODipine 5 MG tablet Commonly known as:  NORVASC Take 1 tablet (5 mg total) by mouth daily.   aspirin 81 MG tablet Take 324 mg by mouth daily.   atorvastatin 80 MG tablet Commonly known as:  LIPITOR TAKE 1 TABLET AT BEDTIME   B-D SINGLE USE SWABS REGULAR Pads Use to cleans area prior to checking blood sugar three times daily and as needed.  Diagnosis:  E11.22  Insulin  dependent.   benzonatate 100 MG capsule Commonly known as:  TESSALON Take 1-2 capsules (100-200 mg total) by mouth 3 (three) times daily as needed.   carvedilol 12.5 MG tablet Commonly known as:  COREG Take 1 tablet (12.5 mg total) by mouth 2 (two) times daily with a meal. What changed:    medication strength  how much to take   cloNIDine 0.3 MG tablet Commonly known as:  CATAPRES Take 1 tablet (0.3 mg total) by mouth 3 (three) times daily.   clopidogrel 75 MG tablet Commonly known as:  PLAVIX Take 1 tablet (75 mg total) by mouth daily.   diclofenac sodium 1 % Gel Commonly known as:  VOLTAREN Apply 2 g topically 2 (two) times daily as needed (for left knee pain.).   diphenhydrAMINE 50 MG tablet Commonly known as:   BENADRYL Take 50 mg by mouth at bedtime.   docusate sodium 100 MG capsule Commonly known as:  COLACE Take 100 mg by mouth daily as needed for mild constipation.   erythromycin ophthalmic ointment Place into the left eye every 6 (six) hours.   ezetimibe 10 MG tablet Commonly known as:  ZETIA Take 1 tablet (10 mg total) by mouth daily.   gabapentin 800 MG tablet Commonly known as:  NEURONTIN TAKE 1 TABLET IN THE MORNING  AND TAKE 1 AND 1/2 TABLETS AT BEDTIME What changed:  See the new instructions.   glucose blood test strip Commonly known as:  Accu-Chek Aviva Plus Use as instructed to check blood sugar 3 times daily or as needed.  Diagnosis:  E11.22  Insulin-dependent.   insulin NPH-regular Human (70-30) 100 UNIT/ML injection Commonly known as:  NovoLIN 70/30 INJECT  70 UNITS SUBCUTANEOUSLY EVERY MORNING  AND  50 units at bedtime What changed:    how much to take  how to take this  when to take this  additional instructions   INSULIN SYRINGE 1CC/30GX5/16" 30G X 5/16" 1 ML Misc Use as directed to take insulin Dx 250.62   isosorbide mononitrate 60 MG 24 hr tablet Commonly known as:  IMDUR Take 1.5 tablets (90 mg total) by mouth 2 (two) times daily.   nitroGLYCERIN 0.4 MG SL tablet Commonly known as:  NITROSTAT Place 1 tablet (0.4 mg total) under the tongue every 5 (five) minutes as needed. May repeat x3   omeprazole 20 MG capsule Commonly known as:  PRILOSEC Take 20 mg by mouth daily.   oxyCODONE 15 MG immediate release tablet Commonly known as:  ROXICODONE Take 1 tablet (15 mg total) by mouth at bedtime as needed for pain.   potassium chloride 10 MEQ tablet Commonly known as:  K-DUR Take 1 tablet (10 mEq total) by mouth daily.   sildenafil 20 MG tablet Commonly known as:  REVATIO Take 1 tablet (20 mg total) by mouth 3 (three) times daily.   tamsulosin 0.4 MG Caps capsule Commonly known as:  FLOMAX Take 1 capsule (0.4 mg total) by mouth at bedtime.         DISCHARGE INSTRUCTIONS:   Encourage p.o. fluid intake until seen by nephrology on Thursday 09/26/2018 Follow-up with cardiology Dr. Rockey Situ in a week Follow-up with primary care physician in 3 to 4 days Follow-up with nephrology Dr. Zollie Scale on 09/26/2018 Thursday Daily weight, fluid intake and output monitoring  DIET:  Cardiac diet and Diabetic diet  DISCHARGE CONDITION:  FAIR  ACTIVITY:  Activity as tolerated  OXYGEN:  Home Oxygen: Yes.     Oxygen Delivery: 2-3 liters/min via  Patient connected to nasal cannula oxygen  DISCHARGE LOCATION:  home   If you experience worsening of your admission symptoms, develop shortness of breath, life threatening emergency, suicidal or homicidal thoughts you must seek medical attention immediately by calling 911 or calling your MD immediately  if symptoms less severe.  You Must read complete instructions/literature along with all the possible adverse reactions/side effects for all the Medicines you take and that have been prescribed to you. Take any new Medicines after you have completely understood and accpet all the possible adverse reactions/side effects.   Please note  You were cared for by a hospitalist during your hospital stay. If you have any questions about your discharge medications or the care you received while you were in the hospital after you are discharged, you can call the unit and asked to speak with the hospitalist on call if the hospitalist that took care of you is not available. Once you are discharged, your primary care physician will handle any further medical issues. Please note that NO REFILLS for any discharge medications will be authorized once you are discharged, as it is imperative that you return to your primary care physician (or establish a relationship with a primary care physician if you do not have one) for your aftercare needs so that they can reassess your need for medications and monitor your lab  values.     Today  Chief Complaint  Patient presents with  . Fall  . Facial Injury    Patient is doing fine.  Denies any dizzy spells desperately wanted to go home Okay to discharge patient from cardiology and nephrology standpoint after giving 500 cc of fluid today  ROS:  CONSTITUTIONAL: Denies fevers, chills. Denies any fatigue, weakness.  EYES: Denies blurry vision, double vision, eye pain. EARS, NOSE, THROAT: Denies tinnitus, ear pain, hearing loss. RESPIRATORY: Denies cough, wheeze, shortness of breath.  CARDIOVASCULAR: Denies chest pain, palpitations, edema.  GASTROINTESTINAL: Denies nausea, vomiting, diarrhea, abdominal pain. Denies bright red blood per rectum. GENITOURINARY: Denies dysuria, hematuria. ENDOCRINE: Denies nocturia or thyroid problems. HEMATOLOGIC AND LYMPHATIC: Denies easy bruising or bleeding. SKIN: Denies rash or lesion. MUSCULOSKELETAL: Denies pain in neck, back, shoulder, knees, hips or arthritic symptoms.  NEUROLOGIC: Denies paralysis, paresthesias.  PSYCHIATRIC: Denies anxiety or depressive symptoms.   VITAL SIGNS:  Blood pressure (!) 137/55, pulse (!) 48, temperature 97.8 F (36.6 C), temperature source Oral, resp. rate 16, height 5' 5" (1.651 m), weight 116.2 kg, SpO2 97 %.  I/O:    Intake/Output Summary (Last 24 hours) at 09/22/2018 1637 Last data filed at 09/22/2018 1536 Gross per 24 hour  Intake 483.8 ml  Output 0 ml  Net 483.8 ml    PHYSICAL EXAMINATION:  GENERAL:  73 y.o.-year-old patient lying in the bed with no acute distress.  EYES: Pupils equal, round, reactive to light and accommodation. No scleral icterus. Extraocular muscles intact.  HEENT: Head atraumatic, normocephalic. Oropharynx and nasopharynx clear.  NECK:  Supple, no jugular venous distention. No thyroid enlargement, no tenderness.  LUNGS: Normal breath sounds bilaterally, no wheezing, rales,rhonchi or crepitation. No use of accessory muscles of respiration.   CARDIOVASCULAR: S1, S2 normal. No murmurs, rubs, or gallops.  ABDOMEN: Soft, non-tender, non-distended. Bowel sounds present.  EXTREMITIES: No pedal edema, cyanosis, or clubbing.  NEUROLOGIC: Awake alert and oriented x3 sensation intact. Gait not checked.  PSYCHIATRIC: The patient is alert and oriented x 3.  SKIN: No obvious rash, lesion, or ulcer.  Face with bruises  DATA REVIEW:  CBC Recent Labs  Lab 09/21/18 0540  WBC 10.5  HGB 14.1  HCT 43.3  PLT 209    Chemistries  Recent Labs  Lab 09/20/18 0713  09/22/18 0505  NA 141   < > 136  K 3.7   < > 3.9  CL 108   < > 104  CO2 28   < > 24  GLUCOSE 171*   < > 381*  BUN 25*   < > 49*  CREATININE 1.89*   < > 2.61*  CALCIUM 8.4*   < > 8.2*  AST 31  --   --   ALT 26  --   --   ALKPHOS 81  --   --   BILITOT 0.6  --   --    < > = values in this interval not displayed.    Cardiac Enzymes Recent Labs  Lab 09/21/18 0540  TROPONINI 0.03*    Microbiology Results  Results for orders placed or performed during the hospital encounter of 10/06/17  Urine culture     Status: None   Collection Time: 10/06/17 11:44 AM  Result Value Ref Range Status   Specimen Description   Final    URINE, CLEAN CATCH Performed at [facility], [address] [place], [place] [address]    Special Requests   Final    Normal Performed at [facility], [address] [place], [place] [address]    Culture   Final    NO GROWTH Performed at [facility], [place] [address], [place], [place] [address]    Report Status 10/07/2017 FINAL  Final    RADIOLOGY:  Dg Chest 2 View  Result Date: 09/20/2018 CLINICAL DATA:  73 year old male status post syncope and fall. Chest wall pain. EXAM: CHEST - 2 VIEW COMPARISON:  Chest radiographs 08/14/2018 and earlier. FINDINGS: Chronically increased AP dimension to the chest and elevated anterior diaphragm giving the appearance of low lung volumes on the AP view. Evidence of new  small pleural effusions on the lateral. Trace fluid in the right minor fissure. No pneumothorax or pulmonary edema. Respiratory motion artifact on the lateral. No confluent pulmonary opacity. Stable cardiac size and mediastinal contours. Flowing osteophytes or syndesmophytes throughout the thoracic spine with evidence of ankylosis. No displaced rib fracture identified. Nonobstructed visible bowel gas pattern. IMPRESSION: 1. New small pleural effusion(s) since February. 2. No other acute pulmonary process or acute traumatic injury identified. Electronically Signed   By: Genevie Ann M.D.   On: 09/20/2018 08:01   Ct Head Wo Contrast  Result Date: 09/20/2018 CLINICAL DATA:  Fall.  Trauma EXAM: CT HEAD WITHOUT CONTRAST CT MAXILLOFACIAL WITHOUT CONTRAST CT CERVICAL SPINE WITHOUT CONTRAST TECHNIQUE: Multidetector CT imaging of the head, cervical spine, and maxillofacial structures were performed using the standard protocol without intravenous contrast. Multiplanar CT image reconstructions of the cervical spine and maxillofacial structures were also generated. COMPARISON:  None. FINDINGS: CT HEAD FINDINGS Brain: Mild atrophy with mild chronic microvascular ischemic type changes in the white matter. No acute infarct, hemorrhage, mass. No fluid collection or midline shift. Vascular: Negative for hyperdense vessel Skull: Negative for fracture Other: Negative CT MAXILLOFACIAL FINDINGS Osseous: Negative for facial fracture Orbits: Bilateral periorbital edema. Bilateral cataract surgery. No orbital mass. Sinuses: Mild mucosal edema in the paranasal sinuses without air-fluid level. Soft tissues: Negative for soft tissue mass. CT CERVICAL SPINE FINDINGS Alignment: 2 mm anterolisthesis C4-5.  Mild levoscoliosis Skull base and vertebrae: Negative for fracture  Soft tissues and spinal canal: Negative for soft tissue mass. Carotid artery calcification bilaterally Disc levels: Advanced disc and facet degeneration throughout the cervical  spine. Foraminal encroachment bilaterally at C3-4, C4-5, C5-6, C6-7. Foraminal narrowing is severe on the right at C3-4 C4-5 and C5-6 and C6-7. Upper chest: Negative Other: None IMPRESSION: 1. No acute intracranial abnormality. Atrophy and chronic microvascular ischemia 2. Negative for facial fracture 3. Advanced cervical spondylosis. Negative for cervical spine fracture. Electronically Signed   By: Franchot Gallo M.D.   On: 09/20/2018 08:20   Ct Chest Wo Contrast  Result Date: 09/20/2018 CLINICAL DATA:  73 year old male with history of trauma from a fall. Injury to chest. EXAM: CT CHEST WITHOUT CONTRAST TECHNIQUE: Multidetector CT imaging of the chest was performed following the standard protocol without IV contrast. COMPARISON:  No priors. FINDINGS: Cardiovascular: Heart size is mildly enlarged. There is no significant pericardial fluid, thickening or pericardial calcification. There is aortic atherosclerosis, as well as atherosclerosis of the great vessels of the mediastinum and the coronary arteries, including calcified atherosclerotic plaque in the left main, left anterior descending, left circumflex and right coronary arteries. Mild calcification of the aortic valve. Moderate calcification of the mitral annulus. Mediastinum/Nodes: No pathologically enlarged mediastinal or hilar lymph nodes. Please note that accurate exclusion of hilar adenopathy is limited on noncontrast CT scans. Esophagus is unremarkable in appearance. No axillary lymphadenopathy. Lungs/Pleura: No pneumothorax. Elevation of the right hemidiaphragm, which appears similar to prior CT the abdomen and pelvis 11/23/2016. Trace bilateral pleural effusions lying dependently. Areas of linear scarring are noted throughout the lung bases bilaterally, similar to the prior study. In addition, there are areas of ground-glass attenuation, septal thickening, thickening of the peribronchovascular interstitium and regional areas of architectural  distortion scattered throughout the lung bases bilaterally, concerning for interstitial lung disease. No frank honeycombing noted. No acute consolidative airspace disease. No pleural effusions. No suspicious appearing pulmonary nodules or masses are noted. Upper Abdomen: Well-defined 3.2 x 3.2 cm low-attenuation (8 HU) left adrenal nodule, similar to the prior examination, compatible with an adenoma. Surgical clips in the upper right retroperitoneum. Musculoskeletal: There are no aggressive appearing lytic or blastic lesions noted in the visualized portions of the skeleton. IMPRESSION: 1. No evidence of significant acute traumatic injury to the thorax. 2. There is a spectrum of findings in the lungs concerning for interstitial lung disease, considered indeterminate for usual interstitial pneumonia (UIP) per current ATS guidelines. Outpatient referral to Pulmonology for further evaluation is suggested in the near future. Additionally, follow-up high-resolution chest CT is recommended in 12 months to assess for temporal changes in the appearance of the lung parenchyma. 3. Trace bilateral pleural effusions lying dependently. 4. Aortic atherosclerosis, in addition to left main and 3 vessel coronary artery disease. Please note that although the presence of coronary artery calcium documents the presence of coronary artery disease, the severity of this disease and any potential stenosis cannot be assessed on this non-gated CT examination. Assessment for potential risk factor modification, dietary therapy or pharmacologic therapy may be warranted, if clinically indicated. 5. There are calcifications of the aortic valve and mitral annulus. Echocardiographic correlation for evaluation of potential valvular dysfunction may be warranted if clinically indicated. 6. Mild cardiomegaly. Aortic Atherosclerosis (ICD10-I70.0). Electronically Signed   By: Vinnie Langton M.D.   On: 09/20/2018 08:54   Ct Cervical Spine Wo  Contrast  Result Date: 09/20/2018 CLINICAL DATA:  Fall.  Trauma EXAM: CT HEAD WITHOUT CONTRAST CT MAXILLOFACIAL WITHOUT CONTRAST CT CERVICAL SPINE  WITHOUT CONTRAST TECHNIQUE: Multidetector CT imaging of the head, cervical spine, and maxillofacial structures were performed using the standard protocol without intravenous contrast. Multiplanar CT image reconstructions of the cervical spine and maxillofacial structures were also generated. COMPARISON:  None. FINDINGS: CT HEAD FINDINGS Brain: Mild atrophy with mild chronic microvascular ischemic type changes in the white matter. No acute infarct, hemorrhage, mass. No fluid collection or midline shift. Vascular: Negative for hyperdense vessel Skull: Negative for fracture Other: Negative CT MAXILLOFACIAL FINDINGS Osseous: Negative for facial fracture Orbits: Bilateral periorbital edema. Bilateral cataract surgery. No orbital mass. Sinuses: Mild mucosal edema in the paranasal sinuses without air-fluid level. Soft tissues: Negative for soft tissue mass. CT CERVICAL SPINE FINDINGS Alignment: 2 mm anterolisthesis C4-5.  Mild levoscoliosis Skull base and vertebrae: Negative for fracture Soft tissues and spinal canal: Negative for soft tissue mass. Carotid artery calcification bilaterally Disc levels: Advanced disc and facet degeneration throughout the cervical spine. Foraminal encroachment bilaterally at C3-4, C4-5, C5-6, C6-7. Foraminal narrowing is severe on the right at C3-4 C4-5 and C5-6 and C6-7. Upper chest: Negative Other: None IMPRESSION: 1. No acute intracranial abnormality. Atrophy and chronic microvascular ischemia 2. Negative for facial fracture 3. Advanced cervical spondylosis. Negative for cervical spine fracture. Electronically Signed   By: Franchot Gallo M.D.   On: 09/20/2018 08:20   US Carotid Bilateral  Result Date: 09/20/2018 CLINICAL DATA:  CVA EXAM: BILATERAL CAROTID DUPLEX ULTRASOUND TECHNIQUE: Pearline Cables scale imaging, color Doppler and duplex ultrasound  were performed of bilateral carotid and vertebral arteries in the neck. COMPARISON:  None. FINDINGS: Criteria: Quantification of carotid stenosis is based on velocity parameters that correlate the residual internal carotid diameter with NASCET-based stenosis levels, using the diameter of the distal internal carotid lumen as the denominator for stenosis measurement. The following velocity measurements were obtained: RIGHT ICA: 90 cm/sec CCA: 77 cm/sec SYSTOLIC ICA/CCA RATIO:  1.2 ECA: 106 cm/sec LEFT ICA: 101 cm/sec CCA: 67 cm/sec SYSTOLIC ICA/CCA RATIO:  1.5 ECA: 97 cm/sec RIGHT CAROTID ARTERY: Moderate irregular calcified plaque in the bulb. Low resistance internal carotid Doppler pattern is preserved. RIGHT VERTEBRAL ARTERY:  Antegrade. LEFT CAROTID ARTERY: Mild irregular calcified plaque in the bulb. Low resistance internal carotid Doppler pattern is preserved. LEFT VERTEBRAL ARTERY:  Antegrade. IMPRESSION: Less than 50% stenosis in the right and left internal carotid arteries. Electronically Signed   By: Marybelle Killings M.D.   On: 09/20/2018 14:44   Ct Maxillofacial Wo Contrast  Result Date: 09/20/2018 CLINICAL DATA:  Fall.  Trauma EXAM: CT HEAD WITHOUT CONTRAST CT MAXILLOFACIAL WITHOUT CONTRAST CT CERVICAL SPINE WITHOUT CONTRAST TECHNIQUE: Multidetector CT imaging of the head, cervical spine, and maxillofacial structures were performed using the standard protocol without intravenous contrast. Multiplanar CT image reconstructions of the cervical spine and maxillofacial structures were also generated. COMPARISON:  None. FINDINGS: CT HEAD FINDINGS Brain: Mild atrophy with mild chronic microvascular ischemic type changes in the white matter. No acute infarct, hemorrhage, mass. No fluid collection or midline shift. Vascular: Negative for hyperdense vessel Skull: Negative for fracture Other: Negative CT MAXILLOFACIAL FINDINGS Osseous: Negative for facial fracture Orbits: Bilateral periorbital edema. Bilateral  cataract surgery. No orbital mass. Sinuses: Mild mucosal edema in the paranasal sinuses without air-fluid level. Soft tissues: Negative for soft tissue mass. CT CERVICAL SPINE FINDINGS Alignment: 2 mm anterolisthesis C4-5.  Mild levoscoliosis Skull base and vertebrae: Negative for fracture Soft tissues and spinal canal: Negative for soft tissue mass. Carotid artery calcification bilaterally Disc levels: Advanced disc and facet degeneration throughout  the cervical spine. Foraminal encroachment bilaterally at C3-4, C4-5, C5-6, C6-7. Foraminal narrowing is severe on the right at C3-4 C4-5 and C5-6 and C6-7. Upper chest: Negative Other: None IMPRESSION: 1. No acute intracranial abnormality. Atrophy and chronic microvascular ischemia 2. Negative for facial fracture 3. Advanced cervical spondylosis. Negative for cervical spine fracture. Electronically Signed   By: Franchot Gallo M.D.   On: 09/20/2018 08:20    EKG:   Orders placed or performed during the hospital encounter of 09/20/18  . EKG 12-Lead  . EKG 12-Lead      Management plans discussed with the patient, family and they are in agreement.  CODE STATUS:     Code Status Orders  (From admission, onward)         Start     Ordered   09/20/18 1112  Full code  Continuous     09/20/18 1114        Code Status History    Date Active Date Inactive Code Status Order ID Comments User Context   01/16/2017 0035 01/18/2017 1702 Full Code [id number]  Lance Coon, MD ED   01/11/2017 1654 01/13/2017 1736 Full Code [id number]  Demetrios Loll, MD Inpatient   01/11/2017 1341 01/11/2017 1654 Full Code [id number]  Wellington Hampshire, MD Inpatient   03/20/2012 2310 03/21/2012 1350 Full Code [id number]  Clayton Bibles, PA ED      TOTAL TIME TAKING CARE OF THIS PATIENT: 45 minutes.   Note: This dictation was prepared with Dragon dictation along with smaller phrase technology. Any transcriptional errors that result from this process are unintentional.   _0 @  on  09/22/2018 at 4:37 PM  Between 7am to 6pm - Pager - [phone]  After 6pm go to [email or web address] - password [email or web address] [facility]  Office  [phone]  CC: Primary care physician; [name], MD

## 2018-09-22 NOTE — Progress Notes (Signed)
Inpatient Diabetes Program Recommendations  AACE/ADA: New Consensus Statement on Inpatient Glycemic Control (2015)  Target Ranges:  Prepandial:   less than 140 mg/dL      Peak postprandial:   less than 180 mg/dL (1-2 hours)      Critically ill patients:  140 - 180 mg/dL   Lab Results  Component Value Date   GLUCAP 381 (H) 09/22/2018   HGBA1C 8.4 (H) 09/20/2018    Review of Glycemic Control  Inpatient Diabetes Program Recommendations:    Please consider Increase in 70/30 am insulin dose to 70 units home dose.  Thank you, Nani Gasser. Kyndel Egger, RN, MSN, CDE  Diabetes Coordinator Inpatient Glycemic Control Team Team Pager [phone] (8am-5pm) 09/22/2018 12:04 PM

## 2018-09-22 NOTE — Progress Notes (Addendum)
[facility] [place] NAME: Stephen [name]    MR#:  [id number]  DATE OF BIRTH:  11-Jun-1946  SUBJECTIVE:  CHIEF COMPLAINT: Patient is resting comfortably.  Orthostatic blood pressures are positive.  Denies any dizzy spells while resting This reports generalized weakness otherwise no chest pain or shortness of breath.  Noncompliant with his CPAP machine and home oxygen and patient gets upset when I have recommended him to use oxygen regularly  REVIEW OF SYSTEMS:  CONSTITUTIONAL: No fever, fatigue or weakness.  EYES: No blurred or double vision.  EARS, NOSE, AND THROAT: No tinnitus or ear pain.  RESPIRATORY: No cough, shortness of breath, wheezing or hemoptysis.  CARDIOVASCULAR: No chest pain, orthopnea, edema.  GASTROINTESTINAL: No nausea, vomiting, diarrhea or abdominal pain.  GENITOURINARY: No dysuria, hematuria.  ENDOCRINE: No polyuria, nocturia,  HEMATOLOGY: No anemia, easy bruising or bleeding SKIN: Face with bruises from the fall MUSCULOSKELETAL: No joint pain or arthritis.   NEUROLOGIC: No tingling, numbness, weakness.  PSYCHIATRY: No anxiety or depression.   DRUG ALLERGIES:   Allergies  Allergen Reactions  . Calcium Channel Blockers     Would avoid if possible due to h/o peripheral edema    VITALS:  Blood pressure 139/61, pulse (!) 48, temperature 97.8 F (36.6 C), temperature source Oral, resp. rate 16, height 5\' 5"  (1.651 m), weight 116.2 kg, SpO2 97 %.  PHYSICAL EXAMINATION:  GENERAL:  73 y.o.-year-old patient lying in the bed with no acute distress.  EYES: Pupils equal, round, reactive to light and accommodation. No scleral icterus. Extraocular muscles intact.  HEENT: Head atraumatic, normocephalic. Oropharynx and nasopharynx clear.  NECK:  Supple, no jugular venous distention. No thyroid enlargement, no tenderness.  LUNGS: Normal breath sounds bilaterally, no wheezing, rales,rhonchi or crepitation. No use of  accessory muscles of respiration.  CARDIOVASCULAR: S1, S2 normal. No murmurs, rubs, or gallops.  ABDOMEN: Soft, nontender, nondistended. Bowel sounds present.  EXTREMITIES: No pedal edema, cyanosis, or clubbing.  NEUROLOGIC: Awake, alert and oriented x3 sensation intact. Gait not checked.  PSYCHIATRIC: The patient is alert and oriented x 3.  SKIN: No obvious rash, lesion, or ulcer.    LABORATORY PANEL:   CBC Recent Labs  Lab 09/21/18 0540  WBC 10.5  HGB 14.1  HCT 43.3  PLT 209   ------------------------------------------------------------------------------------------------------------------  Chemistries  Recent Labs  Lab 09/20/18 0713  09/22/18 0505  NA 141   < > 136  K 3.7   < > 3.9  CL 108   < > 104  CO2 28   < > 24  GLUCOSE 171*   < > 381*  BUN 25*   < > 49*  CREATININE 1.89*   < > 2.61*  CALCIUM 8.4*   < > 8.2*  AST 31  --   --   ALT 26  --   --   ALKPHOS 81  --   --   BILITOT 0.6  --   --    < > = values in this interval not displayed.   ------------------------------------------------------------------------------------------------------------------  Cardiac Enzymes Recent Labs  Lab 09/21/18 0540  TROPONINI 0.03*   ------------------------------------------------------------------------------------------------------------------  RADIOLOGY:  US Carotid Bilateral  Result Date: 09/20/2018 CLINICAL DATA:  CVA EXAM: BILATERAL CAROTID DUPLEX ULTRASOUND TECHNIQUE: Pearline Cables scale imaging, color Doppler and duplex ultrasound were performed of bilateral carotid and vertebral arteries in the neck. COMPARISON:  None. FINDINGS: Criteria: Quantification of carotid stenosis is based on velocity parameters that correlate the residual internal  carotid diameter with NASCET-based stenosis levels, using the diameter of the distal internal carotid lumen as the denominator for stenosis measurement. The following velocity measurements were obtained: RIGHT ICA: 90 cm/sec CCA: 77  cm/sec SYSTOLIC ICA/CCA RATIO:  1.2 ECA: 106 cm/sec LEFT ICA: 101 cm/sec CCA: 67 cm/sec SYSTOLIC ICA/CCA RATIO:  1.5 ECA: 97 cm/sec RIGHT CAROTID ARTERY: Moderate irregular calcified plaque in the bulb. Low resistance internal carotid Doppler pattern is preserved. RIGHT VERTEBRAL ARTERY:  Antegrade. LEFT CAROTID ARTERY: Mild irregular calcified plaque in the bulb. Low resistance internal carotid Doppler pattern is preserved. LEFT VERTEBRAL ARTERY:  Antegrade. IMPRESSION: Less than 50% stenosis in the right and left internal carotid arteries. Electronically Signed   By: Marybelle Killings M.D.   On: 09/20/2018 14:44    EKG:   Orders placed or performed during the hospital encounter of 09/20/18  . EKG 12-Lead  . EKG 12-Lead    ASSESSMENT AND PLAN:    73 year old male with history of chronic diastolic heart failure, diabetes, OSA on CPAP who presents after syncopal event.  # Syncope secondary to orthostatic hypotension, hypoxia from noncompliance with home oxygen and CPAP machine -Gentle hydration with IV fluids with close monitoring for symptoms and signs of fluid overload Continue to monitor troponins -0.04-0.06-0.03 Seen by cardiology Dr. Rockey Situ, no further cardiac work-up needed Order carotid Doppler and orthostatic vital signs. -PT eval-no need of PT identified -Reinforced the importance of being compliant with his home oxygen and CPAP machine   #.   chronic diastolic heart failure: Hold torsemide and other diuretics as patient is dehydrated and orthostatic Follow intake and output with daily weight CHF clinic upon discharge Echocardiogram ordered [facility]  is following   #Hypoxia secondary to multifactorial etiology; interstitial lung disease, possible pulmonary hypertension based on echocardiogram with right heart pressure 50 mmHg on echocardiogram and noncompliance Holding IV Lasix in view of acute renal insufficiency and orthostatic hypotension -Cardiology is recommending to start  patient on sildenafil 20 mg 3 times daily -Continue home oxygen -CPAP nightly Outpatient follow-up with cardio for possible right heart catheterization to rule out pulmonary hypertension   #.  Mildly elevated troponin: This is due to demand ischemia Follow telemetry and troponins. -Troponin 0 0.06-0.03 patient is asymptomatic at this time Echo if indicated by cardiology  Worsening acute kidney injury on chronic kidney disease stage III Baseline creatinine at 1.8 Today's creatinine is 2.66-2.61 patient has received IV Solu-Medrol 125 mg once in the ED and  IV Lasix which might be reason for worsening of renal function Lovenox SQ changed to heparin subcu Avoid nephrotoxins   # Diabetes: Continue outpatient regimen 70/30 -50 units twice a day.  Patient takes 70 units in a.m. and 50 units nightly at home ADA diet with sliding scale  #.  OSA: CPAP at night  # CAD: Continue Plavix, aspirin, statin  #.  Essential hypertension: Continue Norvasc, Coreg, clonidine, isosorbide  #.  BPH: Continue Flomax    All the records are reviewed and case discussed with Care Management/Social Workerr. Management plans discussed with the patient, wife and they are in agreement.  CODE STATUS: fc  TOTAL TIME TAKING CARE OF THIS PATIENT: 37  minutes.   POSSIBLE D/C IN 1-2 DAYS, DEPENDING ON CLINICAL CONDITION.  Note: This dictation was prepared with Dragon dictation along with smaller phrase technology. Any transcriptional errors that result from this process are unintentional.   Nicholes Mango M.D on 09/22/2018 at 11:53 AM  Between 7am to 6pm - Pager -  [phone] After 6pm go to [email or web address] - password [email or web address] [facility]  Office  [phone]  CC: Primary care physician; [name], MD

## 2018-09-22 NOTE — Discharge Instructions (Signed)
Encourage p.o. fluid intake until seen by nephrology on Thursday 09/26/2018 Follow-up with cardiology Dr. Rockey Situ in a week Follow-up with primary care physician in 3 to 4 days Follow-up with nephrology Dr. Zollie Scale on 09/26/2018 Thursday Daily weight, fluid intake and output monitoring

## 2018-09-22 NOTE — Progress Notes (Signed)
Progress Note  Patient Name: Stephen [name] Date of Encounter: 09/22/2018  Primary Cardiologist: Ida Rogue, MD   Subjective   Resting comfortably He has expressed desire to go home Reports that he ambulated yesterday 3 L nasal cannula oxygen saturations down to 89% Was asymptomatic Orthostatics done this morning systolic pressure 540 down to 120, asymptomatic Bradycardia rates 50s, occasional 40s recorded but reports he is asymptomatic  Still with some tenderness on his face   Inpatient Medications    Scheduled Meds: . amLODipine  5 mg Oral Daily  . aspirin  324 mg Oral Daily  . atorvastatin  80 mg Oral QHS  . carvedilol  25 mg Oral BID WC  . cloNIDine  0.3 mg Oral TID  . clopidogrel  75 mg Oral Daily  . diphenhydrAMINE  50 mg Oral QHS  . erythromycin   Left Eye Q6H  . ezetimibe  10 mg Oral Daily  . gabapentin  800 mg Oral q morning - 10a  . gabapentin  1,200 mg Oral QHS  . heparin injection (subcutaneous)  5,000 Units Subcutaneous Q8H  . insulin aspart  0-9 Units Subcutaneous TID AC & HS  . insulin aspart protamine- aspart  50 Units Subcutaneous Q supper  . [START ON 09/23/2018] insulin aspart protamine- aspart  70 Units Subcutaneous Q breakfast  . isosorbide mononitrate  90 mg Oral BID  . pantoprazole  40 mg Oral Daily  . potassium chloride  10 mEq Oral Daily  . sildenafil  20 mg Oral TID  . sodium chloride flush  3 mL Intravenous Q12H  . tamsulosin  0.4 mg Oral QHS   Continuous Infusions: . sodium chloride    . sodium chloride 150 mL/hr at 09/22/18 1231   PRN Meds: sodium chloride, acetaminophen **OR** acetaminophen, albuterol, docusate sodium, HYDROcodone-acetaminophen, ondansetron **OR** ondansetron (ZOFRAN) IV, polyethylene glycol   Vital Signs    Vitals:   09/21/18 1529 09/21/18 1920 09/22/18 0342 09/22/18 0727  BP: (!) 151/61 (!) 133/52 (!) 150/73 139/61  Pulse: (!) 50 (!) 50 (!) 48 (!) 48  Resp: 19 20 20 16   Temp: (!) 97.5 F (36.4 C) 98 F  (36.7 C) 97.9 F (36.6 C) 97.8 F (36.6 C)  TempSrc: Oral Oral Oral Oral  SpO2: 97% 100% 97% 97%  Weight:   116.2 kg   Height:        Intake/Output Summary (Last 24 hours) at 09/22/2018 1411 Last data filed at 09/22/2018 0350 Gross per 24 hour  Intake -  Output 0 ml  Net 0 ml   Last 3 Weights 09/22/2018 09/20/2018 09/16/2018  Weight (lbs) 256 lb 2.8 oz 263 lb 262 lb  Weight (kg) 116.2 kg 119.296 kg 118.842 kg      Telemetry    Normal sinus rhythm- Personally Reviewed  ECG     - Personally Reviewed  Physical Exam   GEN: No acute distress.  On nasal cannula oxygen Neck: No JVD Cardiac: RRR, no murmurs, rubs, or gallops.  Respiratory: Scattered Rales otherwise clear GI: Soft, nontender, non-distended  MS: 1+ pitting lower extremity edema  No deformity. Neuro:  Nonfocal  Psych: Normal affect   Labs    Chemistry Recent Labs  Lab 09/20/18 0713 09/21/18 0540 09/22/18 0505  NA 141 137 136  K 3.7 4.1 3.9  CL 108 103 104  CO2 28 24 24   GLUCOSE 171* 395* 381*  BUN 25* 37* 49*  CREATININE 1.89* 2.66* 2.61*  CALCIUM 8.4* 8.2* 8.2*  PROT 6.5  --   --  ALBUMIN 2.7*  --   --   AST 31  --   --   ALT 26  --   --   ALKPHOS 81  --   --   BILITOT 0.6  --   --   GFRNONAA 35* 23* 24*  GFRAA 41* 27* 28*  ANIONGAP 5 10 8      Hematology Recent Labs  Lab 09/20/18 0713 09/21/18 0540  WBC 13.8* 10.5  RBC 5.27 4.94  HGB 15.0 14.1  HCT 46.3 43.3  MCV 87.9 87.7  MCH 28.5 28.5  MCHC 32.4 32.6  RDW 14.7 14.6  PLT 195 209    Cardiac Enzymes Recent Labs  Lab 09/20/18 0713 09/20/18 1808 09/21/18 0540  TROPONINI 0.04* 0.06* 0.03*   No results for input(s): TROPIPOC in the last 168 hours.   BNP Recent Labs  Lab 09/20/18 0713  BNP 149.0*     DDimer No results for input(s): DDIMER in the last 168 hours.   Radiology    US Carotid Bilateral  Result Date: 09/20/2018 CLINICAL DATA:  CVA EXAM: BILATERAL CAROTID DUPLEX ULTRASOUND TECHNIQUE: Pearline Cables scale imaging,  color Doppler and duplex ultrasound were performed of bilateral carotid and vertebral arteries in the neck. COMPARISON:  None. FINDINGS: Criteria: Quantification of carotid stenosis is based on velocity parameters that correlate the residual internal carotid diameter with NASCET-based stenosis levels, using the diameter of the distal internal carotid lumen as the denominator for stenosis measurement. The following velocity measurements were obtained: RIGHT ICA: 90 cm/sec CCA: 77 cm/sec SYSTOLIC ICA/CCA RATIO:  1.2 ECA: 106 cm/sec LEFT ICA: 101 cm/sec CCA: 67 cm/sec SYSTOLIC ICA/CCA RATIO:  1.5 ECA: 97 cm/sec RIGHT CAROTID ARTERY: Moderate irregular calcified plaque in the bulb. Low resistance internal carotid Doppler pattern is preserved. RIGHT VERTEBRAL ARTERY:  Antegrade. LEFT CAROTID ARTERY: Mild irregular calcified plaque in the bulb. Low resistance internal carotid Doppler pattern is preserved. LEFT VERTEBRAL ARTERY:  Antegrade. IMPRESSION: Less than 50% stenosis in the right and left internal carotid arteries. Electronically Signed   By: Marybelle Killings M.D.   On: 09/20/2018 14:44    Cardiac Studies   Echocardiogram reviewed personally by myself in the room Definity used for better visualization Ejection fraction estimated 50 percent, unable to exclude regional wall motion abnormalities Moderately elevated right heart pressures 45+ RA pressure, likely around 50 total    Patient Profile     [name] is a 73 year old man with history of CAD status post PCI to the LCx and RCA, HTN, HLD, left renal artery stenosis, pulmonary fibrosis, pulmonary hypertension, diastolic heart failure, DM, and CKD stage III, whom we have been asked to see due to syncope.  Assessment & Plan    1.  Syncope Likely secondary to hypoxia, unable to exclude hypotension but blood pressure relatively stable in the emergency room on arrival will -- walks around his house without his oxygen in place with saturations down to  80, high 70s Unclear why he is not wearing oxygen on a consistent basis   unchanged ejection fraction on echo No ischemic work-up needed --Saturations to 89% on 3 L -Recommend he wear his CPAP when sleeping in his recliner  2.  Hypoxia Multifactorial including interstitial lung disease,  pulmonary hypertension on this echocardiogram  after IV Lasix on arrival with dramatic climb in creatinine,  right heart pressure  50 mm per mercury on echo -89% on 3 L nasal cannula with ambulation -Strongly recommended oxygen at all times patient on ambulation around his  house -Recommended he wear CPAP when sleeping in his recliner watching late movies   3) pulmonary hypertension Likely secondary to underlying lung disease Right heart pressure 50 mmHg  on echocardiogram -Would hold diuretics until renal function improved .  He has follow-up with nephrology this coming Thursday March 26 -Ideally needs referral to pulmonary hypertension clinic and right heart catheterization This may take some time given limitations on nonessential testing and limitations on outpatient clinic appointments in the setting of Covid 19 outbreak -Suggest we start sildenafil 20 mg 3 times daily for now.  -Oxygen at all times --CPAP when sleeping in his recliner and when sleeping in the bed -Difficulty losing weight in the past  4) acute on chronic renal failure Given large dose IV Lasix on arrival with worsening renal function Possibly ATN picture, exacerbated by Lasix and hypoxia Receiving small bolus of fluids today and discharge Current admission is not secondary to diastolic heart failure -Would hold torsemide until he sees nephrology this Thursday  Long discussion with hospitalist service and nursing Help to arrange discharge today Case also discussed with nephrology Light fluids, will make medication changes as detailed above  Total encounter time more than 45 minutes  Greater than 50% was spent in counseling and  coordination of care with the patient   For questions or updates, please contact [facility] Please consult [email or web address] for contact info under        Signed, Ida Rogue, MD  09/22/2018, 2:11 PM

## 2018-09-22 NOTE — Plan of Care (Signed)
  Problem: Education: Goal: Knowledge of General Education information will improve Description Including pain rating scale, medication(s)/side effects and non-pharmacologic comfort measures Outcome: Progressing   Problem: Clinical Measurements: Goal: Ability to maintain clinical measurements within normal limits will improve Outcome: Progressing Goal: Respiratory complications will improve Outcome: Progressing   Problem: Pain Managment: Goal: General experience of comfort will improve Outcome: Progressing   Problem: Safety: Goal: Ability to remain free from injury will improve Outcome: Progressing

## 2018-09-22 NOTE — Progress Notes (Signed)
Stephen [name] to be D/C'd Home per MD order.  Discussed prescriptions and follow up appointments with the patient. Prescriptions given to patient, medication list explained in detail. Pt and wife verbalized understanding.  Allergies as of 09/22/2018      Reactions   Calcium Channel Blockers    Would avoid if possible due to h/o peripheral edema      Medication List    STOP taking these medications   torsemide 20 MG tablet Commonly known as:  DEMADEX     TAKE these medications   Accu-Chek Aviva Plus w/Device Kit Use to check blood sugar three times daily and as needed.  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   acetaminophen 325 MG tablet Commonly known as:  TYLENOL Take 2 tablets (650 mg total) by mouth every 6 (six) hours as needed for mild pain (or Fever >/= 101).   albuterol 108 (90 Base) MCG/ACT inhaler Commonly known as:  PROVENTIL HFA;VENTOLIN HFA Inhale 2 puffs into the lungs every 6 (six) hours as needed for wheezing or shortness of breath.   amLODipine 5 MG tablet Commonly known as:  NORVASC Take 1 tablet (5 mg total) by mouth daily.   aspirin 81 MG tablet Take 324 mg by mouth daily.   atorvastatin 80 MG tablet Commonly known as:  LIPITOR TAKE 1 TABLET AT BEDTIME   B-D SINGLE USE SWABS REGULAR Pads Use to cleans area prior to checking blood sugar three times daily and as needed.  Diagnosis:  E11.22  Insulin dependent.   benzonatate 100 MG capsule Commonly known as:  TESSALON Take 1-2 capsules (100-200 mg total) by mouth 3 (three) times daily as needed.   carvedilol 12.5 MG tablet Commonly known as:  COREG Take 1 tablet (12.5 mg total) by mouth 2 (two) times daily with a meal. What changed:    medication strength  how much to take   cloNIDine 0.3 MG tablet Commonly known as:  CATAPRES Take 1 tablet (0.3 mg  total) by mouth 3 (three) times daily.   clopidogrel 75 MG tablet Commonly known as:  PLAVIX Take 1 tablet (75 mg total) by mouth daily.   diclofenac sodium 1 % Gel Commonly known as:  VOLTAREN Apply 2 g topically 2 (two) times daily as needed (for left knee pain.).   diphenhydrAMINE 50 MG tablet Commonly known as:  BENADRYL Take 50 mg by mouth at bedtime.   docusate sodium 100 MG capsule Commonly known as:  COLACE Take 100 mg by mouth daily as needed for mild constipation.   erythromycin ophthalmic ointment Place into the left eye every 6 (six) hours.   ezetimibe 10 MG tablet Commonly known as:  ZETIA Take 1 tablet (10 mg total) by mouth daily.   gabapentin 800 MG tablet Commonly known as:  NEURONTIN TAKE 1 TABLET IN THE MORNING  AND TAKE 1 AND 1/2 TABLETS AT BEDTIME What changed:  See the new instructions.   glucose blood test strip Commonly known as:  Accu-Chek Aviva Plus Use as instructed to check blood sugar 3 times daily or as needed.  Diagnosis:  E11.22  Insulin-dependent.   insulin NPH-regular Human (70-30) 100 UNIT/ML injection Commonly known as:  NovoLIN 70/30 INJECT  70 UNITS SUBCUTANEOUSLY EVERY MORNING  AND  50 units at bedtime What changed:    how much to take  how to take this  when to take this  additional instructions   INSULIN SYRINGE 1CC/30GX5/16" 30G X 5/16" 1 ML Misc Use as directed to take insulin Dx 250.62   isosorbide mononitrate 60 MG 24 hr tablet Commonly known as:  IMDUR Take 1.5 tablets (90 mg total) by mouth 2 (two) times daily.   nitroGLYCERIN 0.4 MG SL tablet Commonly known as:  NITROSTAT Place 1 tablet (0.4 mg total) under the tongue every 5 (five) minutes as needed. May repeat x3   omeprazole 20 MG capsule Commonly known as:  PRILOSEC Take 20 mg by mouth daily.   oxyCODONE 15 MG immediate release tablet Commonly known as:  ROXICODONE Take 1 tablet (15 mg total) by mouth at bedtime as needed for pain.   potassium  chloride 10 MEQ tablet Commonly known as:  K-DUR Take 1 tablet (10 mEq total) by mouth daily.   sildenafil 20 MG tablet Commonly known as:  REVATIO Take 1 tablet (20 mg total) by mouth 3 (three) times daily.   tamsulosin 0.4 MG Caps capsule Commonly known as:  FLOMAX Take 1 capsule (0.4 mg total) by mouth at bedtime.       Vitals:   09/22/18 0727 09/22/18 1611  BP: 139/61 (!) 137/55  Pulse: (!) 48   Resp: 16   Temp: 97.8 F (36.6 C)   SpO2: 97%     Skin clean, dry and intact without evidence of skin break down, no evidence of skin tears noted. IV catheter discontinued intact. Site without signs and symptoms of complications. Dressing and pressure applied. Pt denies pain at this time. No complaints noted. Personal 02 tank at bedside  An After Visit Summary was printed and given to the patient. Patient escorted via [place], and D/C home via private auto.  [place]

## 2018-09-23 ENCOUNTER — Telehealth: Payer: Self-pay

## 2018-09-23 ENCOUNTER — Other Ambulatory Visit: Payer: Self-pay

## 2018-09-23 DIAGNOSIS — J982 Interstitial emphysema: Secondary | ICD-10-CM | POA: Diagnosis not present

## 2018-09-23 DIAGNOSIS — I5032 Chronic diastolic (congestive) heart failure: Secondary | ICD-10-CM

## 2018-09-23 NOTE — Telephone Encounter (Signed)
Noted, will see at [place].  Thanks.

## 2018-09-23 NOTE — Telephone Encounter (Signed)
Patient discharged on 09/22/18 from [facility] with dx of Syncope, Orthostatic hypotension, Hypoxia, Possible pulmonary hypertension, Noncompliance with oxygen and CPAP machine, and Chronic diastolic congestive heart failure.  Per discharge summary:  Encourage p.o. fluid intake until seen by nephrology on Thursday 09/26/2018 Follow-up with cardiology Dr. Rockey Situ in a week Follow-up with primary care physician in 3 to 4 days Follow-up with nephrology Dr. Zollie Scale on 09/26/2018 Thursday Daily weight, fluid intake and output monitoring  Transitional Care Management Follow-up Telephone Call    Date discharged? 09/22/2018  How have you been since you were released from the hospital? [place].   Any patient concerns? Yes, having concerns with low BP when standing.   Items Reviewed:  Medications reviewed: No, spouse was not home.  Allergies reviewed: No, spouse was not home.   Dietary changes reviewed: No  Referrals reviewed: No   Functional Questionnaire:  Independent - I Dependent - D    Activities of Daily Living (ADLs):    Personal hygiene - I Dressing - I Eating - I Maintaining continence - I Transferring - I    Confirmed importance and date/time of follow-up visits scheduled YES  Provider Appointment booked with PCP - 09/24/18 @ 1030  Confirmed with patient if condition begins to worsen call PCP or go to the ER.  Patient was given the office number and encouraged to call back with question or concerns: YES

## 2018-09-24 ENCOUNTER — Ambulatory Visit (INDEPENDENT_AMBULATORY_CARE_PROVIDER_SITE_OTHER): Payer: Medicare HMO | Admitting: Family Medicine

## 2018-09-24 ENCOUNTER — Other Ambulatory Visit: Payer: Self-pay

## 2018-09-24 ENCOUNTER — Encounter: Payer: Self-pay | Admitting: Family Medicine

## 2018-09-24 VITALS — BP 116/58 | HR 77 | Temp 98.2°F | Ht 65.0 in | Wt 262.1 lb

## 2018-09-24 DIAGNOSIS — I2510 Atherosclerotic heart disease of native coronary artery without angina pectoris: Secondary | ICD-10-CM

## 2018-09-24 DIAGNOSIS — R0602 Shortness of breath: Secondary | ICD-10-CM

## 2018-09-24 DIAGNOSIS — E1122 Type 2 diabetes mellitus with diabetic chronic kidney disease: Secondary | ICD-10-CM | POA: Diagnosis not present

## 2018-09-24 DIAGNOSIS — I5030 Unspecified diastolic (congestive) heart failure: Secondary | ICD-10-CM | POA: Diagnosis not present

## 2018-09-24 DIAGNOSIS — N183 Chronic kidney disease, stage 3 unspecified: Secondary | ICD-10-CM

## 2018-09-24 DIAGNOSIS — N179 Acute kidney failure, unspecified: Secondary | ICD-10-CM

## 2018-09-24 DIAGNOSIS — I110 Hypertensive heart disease with heart failure: Secondary | ICD-10-CM

## 2018-09-24 DIAGNOSIS — R600 Localized edema: Secondary | ICD-10-CM | POA: Diagnosis not present

## 2018-09-24 DIAGNOSIS — I270 Primary pulmonary hypertension: Secondary | ICD-10-CM | POA: Diagnosis not present

## 2018-09-24 DIAGNOSIS — J8410 Pulmonary fibrosis, unspecified: Secondary | ICD-10-CM

## 2018-09-24 DIAGNOSIS — Z794 Long term (current) use of insulin: Secondary | ICD-10-CM

## 2018-09-24 LAB — BASIC METABOLIC PANEL
BUN: 38 mg/dL — ABNORMAL HIGH (ref 6–23)
CO2: 29 mEq/L (ref 19–32)
Calcium: 8.1 mg/dL — ABNORMAL LOW (ref 8.4–10.5)
Chloride: 106 mEq/L (ref 96–112)
Creatinine, Ser: 2.03 mg/dL — ABNORMAL HIGH (ref 0.40–1.50)
GFR: 32.57 mL/min — ABNORMAL LOW (ref >60.00)
Glucose, Bld: 218 mg/dL — ABNORMAL HIGH (ref 70–99)
Potassium: 4.3 mEq/L (ref 3.5–5.1)
Sodium: 141 mEq/L (ref 135–145)

## 2018-09-24 MED ORDER — GABAPENTIN 800 MG PO TABS: 800.0000 mg | ORAL_TABLET | ORAL | Status: DC

## 2018-09-24 MED ORDER — CARVEDILOL 12.5 MG PO TABS: 6.2500 mg | ORAL_TABLET | Freq: Two times a day (BID) | ORAL | Status: DC

## 2018-09-24 MED ORDER — ATORVASTATIN CALCIUM 80 MG PO TABS: 80.0000 mg | ORAL_TABLET | Freq: Every day | ORAL | Status: DC

## 2018-09-24 MED ORDER — INSULIN NPH ISOPHANE & REGULAR (70-30) 100 UNIT/ML ~~LOC~~ SUSP: 50.0000 [IU] | SUBCUTANEOUS | Status: DC

## 2018-09-24 NOTE — Progress Notes (Signed)
DATE OF ADMISSION:  09/20/2018    ADMITTING PHYSICIAN: Bettey Costa, MD  DATE OF DISCHARGE: 09/22/18 PRIMARY CARE PHYSICIAN: [name], MD    ADMISSION DIAGNOSIS:  Cerebral infarction [facility]) [I63.9] CVA (cerebral infarction) [I63.9] Acute on chronic respiratory failure with hypoxia (HCC) [J96.21] Syncope, unspecified syncope type [R55]  DISCHARGE DIAGNOSIS:   Syncope Orthostatic hypotension Hypoxia Possible pulmonary hypertension Noncompliance with oxygen and CPAP machine Chronic diastolic congestive heart failure SECONDARY DIAGNOSIS:       Past Medical History:  Diagnosis Date  . Adrenal gland anomaly   . Arthritis   . CAD (coronary artery disease)    a. 04/2006 MI and PCI/stenting to mLCx & mRCA; b. 07/2009 Cath: patent LCX/RCA stents;  c. 12/2016 NSTEMI/Cath: LM 30, LAD 30p, 21m, 30d, D1/2/3 min irregs, LCX 183m ISR, OM1 min irregs, RCA 20p, 10 ISR, 32m, 50d, RPDA min irregs, RPAV 40-->Med Rx; d. 12/2017 MV: fixed lateral wall scar, mild anterior/anterior septal ischemia. EF 30-44%.  . CKD (chronic kidney disease), stage III ([place])   . Colon polyps   . DM2 (diabetes mellitus, type 2) (HCC)    insulin requiring  . Dyslipidemia   . GERD (gastroesophageal reflux disease)   . History of echocardiogram    a. TTE 12/2016: EF 50-55%, mod concentric LVH, images inadequate for wall motion assessment, not technically sufficienct to allow for LV dias fxn, calcified mitral annulus   . HTN (hypertension)   . Kidney stones   . Left Renal artery stenosis ([place])    a. 07/2013 s/p PTA/stenting ([place]); b. 07/2017 Renal Duplex: No significant RAS.  Marland Kitchen Myocardial infarction ([place])   . OSA (obstructive sleep apnea)   . 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  . Recurrent UTI   . Rotator cuff disorder    right  . Skin cancer    head  . Thyroid disorder   . Venous insufficiency  of both lower extremities    a. s/p laser treatment.    HOSPITAL COURSE:  HPI  Stephen [name] a83 y.o.malewith a known history of CAD, chronic kidney disease stage III, diabetes, chronic diastolic heart failure who presents after a syncopal event. Patient reports that he was walking out of the bathroom when he felt lightheaded and fell flat on his face. Patient denies chest pain, shortness of breath, nausea or vomiting. He has been in his usual state of health. He was brought to the ER for further evaluation. Imaging shows no acute fractures or CVA.  Patient also reports increased weight gain and lower extremity edema over the past week.  Patient was noted to have hypoxia while in the emergency room. O2 sat was 86% on room air.  Patient is being admitted for syncopal event along with CHF exacerbation.  Hospital course  #Syncopesecondary to orthostatic hypotension, hypoxia from noncompliance with home oxygen and CPAP machine -Hydrate with IV fluids-normal saline 150 mL/h-500 mL.  And okay to discharge patient after that per cardiology and nephrology.  Patient is insisting going home Continue to monitor troponins-0.04-0.06-0.03 Seen by cardiology Dr. Doreene Nest further cardiac work-up needed Order carotid Doppler and orthostatic vital signs. -PT eval-no need of PT identified -coreg dose decreased to 12.5 mg BID per dr.Gollans recommendations -Reinforced the importance of being compliant with his home oxygen and CPAP machine -carotid dopplers <50 % stenosis BL   #. chronic diastolic heart failure: Hold torsemide and other diureticsas patient is dehydrated and orthostatic Outpatient  follow-up with nephrology Dr. Zollie Scale on Thursday.  Okay to discharge patient from nephrology standpoint Follow intake and output with daily weight CHF clinic upon discharge Echocardiogram 40 to 45% EF [facility] recommends op f/u and ok to dc pt from cardiac standpoint coreg dose  decreased to 12.5 mg BID per dr.Gollans recommendations   #Hypoxia secondary to multifactorial etiology;interstitial lung disease, possible pulmonary hypertension based on echocardiogram with right heart pressure 50 mmHg on echocardiogram and noncompliance Holding IV Lasix in view of acute renal insufficiency and orthostatic hypotension -Cardiology is recommending to start patient on sildenafil 20 mg 3 times daily -Continue home oxygen -CPAP nightly Outpatient follow-upwith cardiofor possible right heart catheterization to rule out pulmonary hypertension   #. Mildly elevated troponin: This is due to demand ischemia Follow telemetry and troponins. -Troponin 0 0.06-0.03 patient is asymptomatic at this time Echo if indicated by cardiology  Worsening acute kidney injury on chronic kidney disease stage III Baseline creatinine at 1.8 Today's creatinine is 2.66-2.61patient has received IV Solu-Medrol 125 mg once in the ED and IV Lasix which might be reason for worsening of renal function Lovenox SQ changed to heparinsubcu Avoid nephrotoxins   #Diabetes: Continue outpatient regimen 70/30-.Patient takes 70 units in a.m. and 50 units nightly at home resume the same ADA diet with sliding scale  #. OSA: CPAP at night  #CAD: Continue Plavix, aspirin, statin  #. Essential hypertension: Continue Norvasc, Coreg, clonidine, isosorbide  #. BPH: Continue Flomax =============================  Hospital follow up.    Inpatient course discussed with patient.  Multifactorial syncope likely was the issue with history of heart failure and chronic lung disease noted.  Echo and carotid study done as inpatient.  Results discussed with patient.  Inpatient course discussed with patient, along with rationale for treatment.  History of worsening creatinine with diuresis, due for follow-up labs.    He was treated with insulin while inpatient for diabetes.  He was maintained on CPAP.   He still on Plavix, aspirin, statin.  His carvedilol was decreased, down to 12.5 mg twice a day.  Imaging noted without any facial fractures fortunately.  Here for follow-up today. He held a dose of carvedilol once due to bradycardia down to 40s once, this was yesterday.  He has renal clinic f/u pending (Dr. Holley Raring).   He has eye clinic f/u pending.    Sugar was 129 this AM.  This is been stable/at baseline for the patient recently.  No low values.  He is not having chest pain.  He has been using 3-4 L of oxygen at home via concentrator.  When he was walking to the car to come to the clinic earlier this morning his breathing got more labored and his pulse ox dropped to the 70s.  This was on 3 L.  When he was walking into the clinic here today his pulse ox dropped into the 70s but he was mouth breathing was not triggering his oxygen concentrator.  We moved him over to 6 L of oxygen via nasal cannula and his pulse ox improved to 94%.  We weaned him down to 4 L and he maintained his oxygen saturation greater than 92%.  We changed him back to his oxygen concentrator with intermittent pulse and he maintained his pulse ox at 92% or greater walking in the clinic to the lab.  I checked his concentrator and it appeared to be functioning correctly when he was triggered with nasal breathing.  He is not having chest pain.  PMH  and SH reviewed  ROS: Per HPI unless specifically indicated in ROS section   Meds, vitals, and allergies reviewed.   GEN: nad, alert and oriented, chronically ill-appearing male.  He was initially short of breath when he had not triggered his oxygen concentrator due to mouth breathing.  This resolved with nasal breathing.  Discussed with patient.  Nonacute scratches noted on the face.  This was expected given previous injury. HEENT: mucous membranes moist NECK: supple w/o LA CV: rrr PULM: ctab, no inc wob ABD: soft, +bs EXT: 1+edema SKIN: no acute rash

## 2018-09-24 NOTE — Patient Instructions (Addendum)
Go to the lab on the way out.  We'll contact you with your lab report. We'll update Dr. Holley Raring.    If you have trouble getting an appointment with the lung clinic then let me know.   In the meantime, then cut the carvedilol back to 6.25mg  twice a day.   Take care.  Glad to see you.

## 2018-09-25 ENCOUNTER — Encounter: Payer: Self-pay | Admitting: *Deleted

## 2018-09-25 ENCOUNTER — Other Ambulatory Visit: Payer: Self-pay | Admitting: *Deleted

## 2018-09-25 NOTE — Patient Outreach (Signed)
[facility]) Care Management  [place]  09/25/2018   Stephen [name] 14-Nov-1945 [id number]     Telephone screening call  Initial outreach call  Referral received: 3/23 Referral source: [facility] hospital liasion Referral reason : complex care management  Insurance : [employer]   Transition of care by PCP office   Patient with [facility] hospital admission 3/20-3/22 Dx: Syncope, orthostatic hypotension, diastolic heart failure, Noncompliance with oxygen and CPAP.   Chart reviewed for PMHX; includes but not limited to COPD, Heart failure , sleep apnea , pulmonary fibrosis, Diabetes , Chronic kidney disease stage 3.    Subjective:  Successful outreach call to patient , introduced self and explained reason for the call . Patient discussed his recent hospital admission related to passing out at home. He reported being feeling dizzy and passing out falling forward into wall, causing scratches on his face, eyelid and cornea tear as well as back and shoulder discomfort. Patient reports that he was not wearing his oxygen at that time.  Patient discussed feeling a little better today than on last night or yesterday.  Patient discussed yesterday visit to PCP an his oxygen level was down to 76% but they where able to get it back up with his oxygen on. Patient discussed having  more problems with oxygen level since recent upper respiratory problem a month ago.  Patient reports now he is sleeping in the recliner chair wearing his oxygen most of time at 4 liters  and sleeping with CPAP on . Patient reports being unable to rest in the bed after fall due to shoulder and back discomfort.   Patient gave verbal agreement to speak with his wife Stephen [name]. Wife discussed conversation with cardiology regarding diagnosis of pulmonary hypertension and new medication ordered and further testing and referral to pulmonary MD as schedule allows with limits appointments due to covid 19.    Encounter  Medications:  Outpatient Encounter Medications as of 09/25/2018  Medication Sig  . ACCU-CHEK SOFTCLIX LANCETS lancets Use as instructed to check blood sugar three times daily and as needed.  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  Marland Kitchen acetaminophen (TYLENOL) 325 MG tablet Take 2 tablets (650 mg total) by mouth every 6 (six) hours as needed for mild pain (or Fever >/= 101).  . Alcohol Swabs (B-D SINGLE USE SWABS REGULAR) PADS Use to cleans area prior to checking blood sugar three times daily and as needed.  Diagnosis:  E11.22  Insulin dependent.  Marland Kitchen amLODipine (NORVASC) 5 MG tablet Take 1 tablet (5 mg total) by mouth daily.  Marland Kitchen aspirin 81 MG tablet Take 324 mg by mouth daily.   Marland Kitchen atorvastatin (LIPITOR) 80 MG tablet Take 1 tablet (80 mg total) by mouth at bedtime.  . benzonatate (TESSALON) 100 MG capsule Take 1-2 capsules (100-200 mg total) by mouth 3 (three) times daily as needed.  . Blood Glucose Monitoring Suppl (ACCU-CHEK AVIVA PLUS) w/Device KIT Use to check blood sugar three times daily and as needed.  Diagnosis: E11.22  Insulin-dependent  . carvedilol (COREG) 12.5 MG tablet Take 0.5 tablets (6.25 mg total) by mouth 2 (two) times daily with a meal.  . cloNIDine (CATAPRES) 0.3 MG tablet Take 1 tablet (0.3 mg total) by mouth 3 (three) times daily.  . clopidogrel (PLAVIX) 75 MG tablet Take 1 tablet (75 mg total) by mouth daily.  . diclofenac sodium (VOLTAREN) 1 % GEL Apply 2 g topically 2 (two) times daily as needed (for left knee pain.).  Marland Kitchen diphenhydrAMINE (BENADRYL)  50 MG tablet Take 50 mg by mouth at bedtime.   . docusate sodium (COLACE) 100 MG capsule Take 100 mg by mouth daily as needed for mild constipation.   Marland Kitchen erythromycin ophthalmic ointment Place into the left eye every 6 (six) hours.  Marland Kitchen ezetimibe (ZETIA) 10 MG tablet Take 1 tablet (10 mg total) by mouth daily.  Marland Kitchen gabapentin (NEURONTIN) 800 MG tablet Take 1-1.5 tablets (800-1,200 mg total) by mouth See admin instructions. Take 1 tablet  ('800mg'$ ) by mouth every morning and 1 tablets ('1200mg'$ ) every night at bedtime  . glucose blood (ACCU-CHEK AVIVA PLUS) test strip Use as instructed to check blood sugar 3 times daily or as needed.  Diagnosis:  E11.22  Insulin-dependent.  . insulin NPH-regular Human (NOVOLIN 70/30) (70-30) 100 UNIT/ML injection Inject 50-70 Units into the skin See admin instructions. Inject 70u under the skin every morning and 50u under the skin at bedtime  . Insulin Syringe-Needle U-100 (INSULIN SYRINGE 1CC/30GX5/16") 30G X 5/16" 1 ML MISC Use as directed to take insulin Dx 250.62  . isosorbide mononitrate (IMDUR) 60 MG 24 hr tablet Take 1.5 tablets (90 mg total) by mouth 2 (two) times daily.  . nitroGLYCERIN (NITROSTAT) 0.4 MG SL tablet Place 1 tablet (0.4 mg total) under the tongue every 5 (five) minutes as needed. May repeat x3  . omeprazole (PRILOSEC) 20 MG capsule Take 20 mg by mouth daily.    Marland Kitchen oxyCODONE (ROXICODONE) 15 MG immediate release tablet Take 1 tablet (15 mg total) by mouth at bedtime as needed for pain.  . potassium chloride (K-DUR) 10 MEQ tablet Take 1 tablet (10 mEq total) by mouth daily.  . sildenafil (REVATIO) 20 MG tablet Take 1 tablet (20 mg total) by mouth 3 (three) times daily.  . tamsulosin (FLOMAX) 0.4 MG CAPS capsule Take 1 capsule (0.4 mg total) by mouth at bedtime.  Marland Kitchen albuterol (PROVENTIL HFA;VENTOLIN HFA) 108 (90 Base) MCG/ACT inhaler Inhale 2 puffs into the lungs every 6 (six) hours as needed for wheezing or shortness of breath. (Patient not taking: Reported on 09/25/2018)   No facility-administered encounter medications on file as of 09/25/2018.     Functional Status:  In your present state of health, do you have any difficulty performing the following activities: 09/25/2018 09/20/2018  Hearing? N N  Vision? Y Y  Comment retinopathy ,scarring of cornea with recent fall  -  Difficulty concentrating or making decisions? N N  Walking or climbing stairs? Y Y  Comment shortness of breath   -  Dressing or bathing? Tempie Donning  Comment wife assist as needed  -  Doing errands, shopping? Y N  Comment wife provides transportation  Facilities manager and eating ? N -  Using the Toilet? N -  In the past six months, have you accidently leaked urine? N -  Do you have problems with loss of bowel control? N -  Managing your Medications? N -  Managing your Finances? N -  Housekeeping or managing your Housekeeping? N -  Comment wife able to complete  -  Some recent data might be hidden    Fall/Depression Screening: Fall Risk  09/25/2018 09/17/2017 02/27/2017  Falls in the past year? 1 Yes Yes  Comment - multiple falls secondary to sepsis; denies injury -  Number falls in past yr: 0 2 or more 1  Injury with Fall? 1 No No  Risk for fall due to : History of fall(s) - -  Follow up Falls prevention discussed -  Falls prevention discussed   PHQ 2/9 Scores 09/25/2018 12/21/2017 09/17/2017 02/27/2017 02/02/2017 01/31/2017 08/31/2016  PHQ - 2 Score 0 0 0 0 0 0 0  PHQ- 9 Score - - 0 - - - -    Assessment:  Patient agreeable to [facility] care management services for education, support and care coordination .  Reviewed fall prevention measures.   Heart failure  Patient has not weighed in the last 2 days, discussed worsening symptoms of heart failure, shortness of breath, swelling, sudden weight gain of 3 pounds in a day and 5 in a week. Patient does have working scales at home.  Pulmonary Hypertension  Patient has Portable oxygen concentrator to use when away from home, other wise at home he is connected to his concentrator. Patient admits to increased compliance with wearing oxygen at 4 liters with readings of 90-92%.   Syncope/Hypotension  Denies dizziness, unable to monitor blood pressure at home, monitor is not working , wife has resources to purchase a new monitor this week.Review of hypotension symptoms and normal blood pressure ranges. Reports heart rate in the 60's at home   Diabetes Monitoring daily  readings 129- 190 range. Taking insulin as prescribed, tolerating meals.  Medications  Patient organizes and manages daily taking from the bottle. Denies cost concerns of medication, receives [employer] order, received insulin from [place] about $75 per month, denies pharmacy referral for assistance she declines as they do not pay for mail order medications . Patient was recently discharged from hospital and all medications have been reviewed. Patient understands recent changes to medication list, coreg decreased and Torsemide on hold until seen by nephrology.  Advanced Directives Patient does not have an advanced directives, interested in having information.   Appointments  Patient has attended post DC visit with PCP  Has nephrology visit on 3/26 with Dr.Lateef  Patient has follow up  eye appointment on 3/26  Wife agreeable to calling to schedule post discharge visit with Dr.Gollan.    Plan:  Will send PCP this initial visit note Will send PCP barrier/involvment letter  Will send patient [facility] calendar, Advanced Directive packet and EMMI, reviewed local resources for completing.  Care planning and goal setting at this telephone visit.  Will plan follow up call  within the next week.  Will send patient welcome letter . Reinforced with patient wearing his oxygen at all times and cpap. Reviewed worsening symptoms of shortness of breath, low blood pressure , low heart rate reading to notify MD of  Provided my contact information and [facility] 24 nurse advice line.     [facility] CM Care Plan Problem One     Most Recent Value  Care Plan Problem One  High risk for readmission related to recent discharge , and readmission for syncope,hypoxia,new diagnosis of pulmonary hypertenison  heart failure   Role Documenting the Problem One  Care Management Chical for Problem One  Active  [facility] Long Term Goal   Patient would not experience hospital admission over the next 60 days   [facility] Long Term Goal  Start Date  09/25/18  Interventions for Problem One Long Term Goal  Reviewed recent discharge instruction and office visit , reinforced taking medicaitons as prescribed, keeping all upcoming visit, discussed transporation.   [facility] CM Short Term Goal #1   Over the next 30 days patient will be able to report wearing oxygen when up and CPAP while sleeping .   [facility] CM Short Term Goal #1 Start Date  09/25/18  Interventions for Short Term Goal #1  Discussed importance of wearing oxygent to prevent low oxygen level, basic education importance of wearing oxygen with Pulmonary hypertension and arteries in lung having to work harder due to narrowing.   [facility] CM Short Term Goal #2   Patient will be able to reports monitoring weight on a daily basis and keeping a record.   [facility] CM Short Term Goal #2 Start Date  09/25/18  Interventions for Short Term Goal #2  Explained the best time of day to weigh, in the morning after going to the bathroom. explained keeping a record to identify sudden weight gain of 2 pounds in a day or 5 in a week to notify MD of this along with symptoms of swelling and shortness of breath .       Joylene Draft, RN, [place] Management Coordinator  [phone]- Mobile [phone]- Toll Free Main Office

## 2018-09-26 DIAGNOSIS — E1122 Type 2 diabetes mellitus with diabetic chronic kidney disease: Secondary | ICD-10-CM | POA: Diagnosis not present

## 2018-09-26 DIAGNOSIS — I270 Primary pulmonary hypertension: Secondary | ICD-10-CM | POA: Insufficient documentation

## 2018-09-26 DIAGNOSIS — I10 Essential (primary) hypertension: Secondary | ICD-10-CM | POA: Diagnosis not present

## 2018-09-26 DIAGNOSIS — N183 Chronic kidney disease, stage 3 (moderate): Secondary | ICD-10-CM | POA: Diagnosis not present

## 2018-09-26 DIAGNOSIS — R600 Localized edema: Secondary | ICD-10-CM | POA: Diagnosis not present

## 2018-09-26 DIAGNOSIS — I701 Atherosclerosis of renal artery: Secondary | ICD-10-CM | POA: Diagnosis not present

## 2018-09-26 DIAGNOSIS — R809 Proteinuria, unspecified: Secondary | ICD-10-CM | POA: Diagnosis not present

## 2018-09-26 DIAGNOSIS — E113213 Type 2 diabetes mellitus with mild nonproliferative diabetic retinopathy with macular edema, bilateral: Secondary | ICD-10-CM | POA: Diagnosis not present

## 2018-09-26 NOTE — Assessment & Plan Note (Signed)
History of.  Continue oxygen.  When appropriately nasal breathing he was getting appropriate oxygen delivery via concentrator and he was able to walk in clinic and maintain appropriate oxygen saturations.  Cautions and use discussed with patient.

## 2018-09-26 NOTE — Assessment & Plan Note (Signed)
Has been started on sildenafil.  Rationale for use discussed with patient.  I will check to see when he can have cardiology follow-up.  No change in meds at this point, other than decreasing his carvedilol due to noted history of recent bradycardia.

## 2018-09-26 NOTE — Assessment & Plan Note (Signed)
In compression stockings.  See notes on labs.

## 2018-09-26 NOTE — Assessment & Plan Note (Signed)
No change in meds at this point.  I want to avoid low sugars.

## 2018-09-26 NOTE — Assessment & Plan Note (Signed)
History of hypertension, history of syncope.  He had episode of bradycardia recently noted.  Decrease carvedilol to 6.25 mg twice a day.  Rationale and cautions discussed with patient.  He agreed.

## 2018-09-26 NOTE — Assessment & Plan Note (Signed)
See notes on follow-up labs. 

## 2018-09-26 NOTE — Assessment & Plan Note (Signed)
He is not having chest pain and it appears that he did not have an acute coronary event to cause syncope.

## 2018-09-27 ENCOUNTER — Telehealth: Payer: Self-pay | Admitting: Cardiovascular Disease

## 2018-09-27 NOTE — Telephone Encounter (Signed)
Patient spouse calling  States that Dr Rockey Situ spoke with patient in the hospital and said he felt he would need a pulmonologist to help with SOB issues  Would like to know the progress of that and if they will get a pulmonary referral  Please call to discuss

## 2018-09-28 ENCOUNTER — Other Ambulatory Visit: Payer: Self-pay | Admitting: Cardiovascular Disease

## 2018-09-28 NOTE — Telephone Encounter (Signed)
We can set up an evisit with him , hospital f/u Will cc Dan Bensimhon and see if he is doing new patient evisits For pulmonary HTN, hypoxia, syncope  I have attached my hospital A/P below  1.  Syncope Likely secondary to hypoxia Acknowledges that he walks around his house without his oxygen in place with saturations down to 80. Wife reports even recording high 70s Unclear why he is not wearing oxygen on a consistent basis which was recommended on his last clinic visit --No further cardiac work-up needed, unchanged ejection fraction on echo No ischemic work-up needed We will continue to monitor telemetry for arrhythmia  2.  Hypoxia Multifactorial including interstitial lung disease, suggestion of pulmonary hypertension on this echocardiogram today He appears prerenal after IV Lasix yesterday and right heart pressure continues to run 50 mm per mercury -Discussed pulmonary hypertension diagnosis with him -If he is still inpatient next week could consider right heart catheterization -He has indicated he would like to go home today or tomorrow -Strongly recommended oxygen at all times patient on ambulation around his house -Recommended he wear CPAP when sleeping in his recliner watching late movies   3) pulmonary hypertension Likely secondary to underlying lung disease Right heart pressure 50 mmHg  on echocardiogram High pressure even after IV Lasix yesterday causing prerenal state creatinine 2.5 well above his baseline -Would hold diuretics until renal function improved -Ideally needs referral to pulmonary hypertension clinic and right heart catheterization This may take some time given limitations on nonessential testing and limitations on outpatient clinic appointments in the setting of Covid 19 outbreak -Suggest we start sildenafil 20 mg 3 times daily for now.  If no improvement could wean off the medication as outpatient -Oxygen at all times CPAP when sleeping in his recliner and  when sleeping in the bed -Difficulty losing weight in the past  4) acute on chronic renal failure Given large dose IV Lasix yesterday with worsening renal function Current admission is not secondary to diastolic heart failure Would hold Lasix, restart torsemide once renal function back to baseline

## 2018-09-30 ENCOUNTER — Telehealth: Payer: Self-pay

## 2018-09-30 NOTE — Telephone Encounter (Signed)
Stephen [name] - can you schedule a new PAH televisit for me later in the week?  Thanks

## 2018-09-30 NOTE — Telephone Encounter (Signed)
Virtual Visit Pre-Appointment Phone Call  Steps For Call:  1. Confirm consent - "In the setting of the current Covid19 crisis, you are scheduled for a VIDEO visit with your provider on 10/09/2018 at 2:00pm.  Just as we do with many in-office visits, in order for you to participate in this visit, we must obtain consent.  If you'd like, I can send this to your mychart (if signed up) or email for you to review.  Otherwise, I can obtain your verbal consent now.  All virtual visits are billed to your insurance company just like a normal visit would be.  By agreeing to a virtual visit, we'd like you to understand that the technology does not allow for your provider to perform an examination, and thus may limit your provider's ability to fully assess your condition.  Finally, though the technology is pretty good, we cannot assure that it will always work on either your or our end, and in the setting of a video visit, we may have to convert it to a phone-only visit.  In either situation, we cannot ensure that we have a secure connection.  Are you willing to proceed?"  2. Give patient instructions for WebEx download to smartphone as below if video visit  3. Advise patient to be prepared with any vital sign or heart rhythm information, their current medicines, and a piece of paper and pen handy for any instructions they may receive the day of their visit  4. Inform patient they will receive a phone call 15 minutes prior to their appointment time (may be from unknown caller ID) so they should be prepared to answer  5. Confirm that appointment type is correct in Epic appointment notes (video vs telephone)    TELEPHONE CALL NOTE  Stephen [name] has been deemed a candidate for a follow-up tele-health visit to limit community exposure during the Covid-19 pandemic. I spoke with the patient via phone to ensure availability of phone/video source, confirm preferred email & phone number, and discuss  instructions and expectations.  I reminded Stephen [name] to be prepared with any vital sign and/or heart rhythm information that could potentially be obtained via home monitoring, at the time of his visit. I reminded Stephen [name] to expect a phone call at the time of his visit if his visit.  Did the patient verbally acknowledge consent to treatment? YES  Janan Ridge, [place] 09/30/2018 11:03 AM   DOWNLOADING THE [facility] TO SMARTPHONE  - If Apple, go to [employer] and type in WebEx in the search bar. [place] [employer], the blue/green circle. The app is free but as with any other app downloads, their phone may require them to verify saved payment information or Apple password. The patient does NOT have to create an account.  - If Android, ask patient to go to [employer] and type in WebEx in the search bar. [place] [employer], the blue/green circle. The app is free but as with any other app downloads, their phone may require them to verify saved payment information or Android password. The patient does NOT have to create an account.   CONSENT FOR TELE-HEALTH VISIT - PLEASE REVIEW  I hereby voluntarily request, consent and authorize [facility] and its employed or contracted physicians, physician assistants, nurse practitioners or other licensed health care professionals (the Practitioner), to provide me with telemedicine health care services (the "Services") as deemed necessary by the treating Practitioner. I acknowledge  and consent to receive the Services by the Practitioner via telemedicine. I understand that the telemedicine visit will involve communicating with the Practitioner through live audiovisual communication technology and the disclosure of certain medical information by electronic transmission. I acknowledge that I have been given the opportunity to request an in-person assessment or other available alternative prior to the telemedicine  visit and am voluntarily participating in the telemedicine visit.  I understand that I have the right to withhold or withdraw my consent to the use of telemedicine in the course of my care at any time, without affecting my right to future care or treatment, and that the Practitioner or I may terminate the telemedicine visit at any time. I understand that I have the right to inspect all information obtained and/or recorded in the course of the telemedicine visit and may receive copies of available information for a reasonable fee.  I understand that some of the potential risks of receiving the Services via telemedicine include:  Marland Kitchen Delay or interruption in medical evaluation due to technological equipment failure or disruption; . Information transmitted may not be sufficient (e.g. poor resolution of images) to allow for appropriate medical decision making by the Practitioner; and/or  . In rare instances, security protocols could fail, causing a breach of personal health information.  Furthermore, I acknowledge that it is my responsibility to provide information about my medical history, conditions and care that is complete and accurate to the best of my ability. I acknowledge that Practitioner's advice, recommendations, and/or decision may be based on factors not within their control, such as incomplete or inaccurate data provided by me or distortions of diagnostic images or specimens that may result from electronic transmissions. I understand that the practice of medicine is not an exact science and that Practitioner makes no warranties or guarantees regarding treatment outcomes. I acknowledge that I will receive a copy of this consent concurrently upon execution via email to the email address I last provided but may also request a printed copy by calling the office of [facility].    I understand that my insurance will be billed for this visit.   I have read or had this consent read to me. . I  understand the contents of this consent, which adequately explains the benefits and risks of the Services being provided via telemedicine.  . I have been provided ample opportunity to ask questions regarding this consent and the Services and have had my questions answered to my satisfaction. . I give my informed consent for the services to be provided through the use of telemedicine in my medical care  By participating in this telemedicine visit I agree to the above.

## 2018-09-30 NOTE — Telephone Encounter (Signed)
Video visit scheduled 10/09/2018 at 2:00pm

## 2018-10-01 NOTE — Telephone Encounter (Signed)
Virtual visit sch w/Dr Bensimhon on 4/9

## 2018-10-03 ENCOUNTER — Other Ambulatory Visit: Payer: Self-pay

## 2018-10-03 ENCOUNTER — Other Ambulatory Visit: Payer: Self-pay | Admitting: *Deleted

## 2018-10-03 NOTE — Patient Outreach (Signed)
[place] [facility]) Care Management  10/03/2018  IZEL EISENHARDT Aug 31, 1945 [id number]   Telephone outreach call   Referral received: 3/23 Referral source: [facility] hospital liasion Referral reason : complex care management  Insurance : [facility]   Transition of care by PCP office   Patient with [facility] hospital admission 3/20-3/22 Dx: Syncope, orthostatic hypotension, diastolic heart failure, Noncompliance with oxygen and CPAP.   Chart reviewed for PMHX; includes but not limited to COPD, Heart failure , sleep apnea , pulmonary fibrosis, Diabetes , Chronic kidney disease stage 3.   1209 Outreach call to patient, no answer,voicemail not set up.  Will plan return call later today.   [place] call to patient , his wife answered phone , patient has given verbal consent to speak with wife. She discussed that patient is currently in the bathroom.She discussed patient having office visits with Dr.Lateef and eye doctor. She discussed appointments went well.    She further discussed:  Fall risk She reports no further falls, patient continues to do well with mobility in home going to bathroom no does not use walker or cane , he  needs assistance with with bathing/shower that she is able to assist . She discussed patient continues to sleep in the chair at night he  tried sleeping in the bed last night it was not comfortable so she returned to bed ,due to recent fall episode with shoulder and back discomfort.   Heart failure /hypotension  Patient has some difficulty with balancing on the scale,on some days, reports his weight on today 265 she reports that weight was obtained later in the day.  She reports that patient does not have shortness of breath, and he is wearing his oxygen 24 hours a day and CPAP at night. She discussed patient oxygen level staying over 97% reports blood pressure readings 110-140/50-70, with heart rate 65, denies fever or cough .  She states that patient does  have some swelling in his lower legs, she discussed patient having a history of swelling in legs and using lyphedema pump to help control he has not used it recently but plans to begin using it daily.  She discussed patient has resumed fluid pill since visit with Dr.Lateef, taking it Monday Wednesday and Friday.  Reinforced heart failure worsening symptoms of notifying MD of weight gain of 2 pounds in a day, 5 in a week, increased shortness of breath, swelling in lower legs.   Appointments  Dr.Golan , cardiology 4/8 Dr.Benshimon, pulmonary hypertension   Plan  Will plan follow up call in the next 2 weeks Reinforced worsening symptoms of shortness of breath, swelling, sudden weight gain , fever or cough . Verified patient has received mailed  education material /advanced directive, packet.   THN CM Care Plan Problem One     Most Recent Value  Care Plan Problem One  High risk for readmission related to recent discharge , and readmission for syncope,hypoxia,new diagnosis of pulmonary hypertenison  heart failure   Role Documenting the Problem One  Care Management [place] for Problem One  Active  THN Long Term Goal   Patient would not experience hospital admission over the next 60 days   THN Long Term Goal Start Date  09/25/18  Interventions for Problem One Long Term Goal  Discussed current clinical state, encouraged notiying MD of new or worsening concerns sooner, to help avoid readmission. Reviewed upcoming visits with cardiolgy and Heart failure clinic for pulmonary hypertension visit. Marland Kitchen  THN CM Short Term Goal #1   Over the next 30 days patient will be able to report wearing oxygen when up and CPAP while sleeping .   THN CM Short Term Goal #1 Start Date  09/25/18  Interventions for Short Term Goal #1  Offered positive reinforcment with progress oxygen , importance of benefits of wearing oxygen  THN CM Short Term Goal #2   Patient will be able to reports monitoring weight on a  daily basis and keeping a record.   THN CM Short Term Goal #2 Start Date  09/25/18  Interventions for Short Term Goal #2  Reinforced regarding best time of day to weigh ,discussed using walker over scales to help with balancing on scale. verified patient has recieved [facility] calendar and how to use for documentation.    THN CM Short Term Goal #3  Patient/wife will be able to identify at least 3 symptoms of worsening heart/lung conditon   THN CM Short Term Goal #3 Start Date  10/03/18  Interventions for Short Tern Goal #3  Advised patient/wife regarding worsening symptoms of heart failure, sudden weight gain of 3 pounds in a day, 5 in a week, increased shortness of breath , increased swelling, discussed which provider to notify as action plan . will plan education material on pulmonary hypertension at  next visit.       Joylene Draft, RN, [place] Management Coordinator  [phone]- Mobile [phone]- Toll Free Main Office

## 2018-10-07 DIAGNOSIS — J449 Chronic obstructive pulmonary disease, unspecified: Secondary | ICD-10-CM | POA: Diagnosis not present

## 2018-10-09 ENCOUNTER — Telehealth (INDEPENDENT_AMBULATORY_CARE_PROVIDER_SITE_OTHER): Payer: Medicare HMO | Admitting: Cardiovascular Disease

## 2018-10-09 ENCOUNTER — Other Ambulatory Visit: Payer: Self-pay

## 2018-10-09 DIAGNOSIS — I5032 Chronic diastolic (congestive) heart failure: Secondary | ICD-10-CM | POA: Diagnosis not present

## 2018-10-09 DIAGNOSIS — I25119 Atherosclerotic heart disease of native coronary artery with unspecified angina pectoris: Secondary | ICD-10-CM

## 2018-10-09 DIAGNOSIS — N183 Chronic kidney disease, stage 3 unspecified: Secondary | ICD-10-CM

## 2018-10-09 DIAGNOSIS — I270 Primary pulmonary hypertension: Secondary | ICD-10-CM | POA: Diagnosis not present

## 2018-10-09 DIAGNOSIS — J8410 Pulmonary fibrosis, unspecified: Secondary | ICD-10-CM

## 2018-10-09 MED ORDER — TORSEMIDE 20 MG PO TABS: ORAL_TABLET | ORAL | Status: DC

## 2018-10-09 MED ORDER — SILDENAFIL CITRATE 20 MG PO TABS: 20.0000 mg | ORAL_TABLET | Freq: Three times a day (TID) | ORAL | 1 refills | Status: DC

## 2018-10-09 NOTE — Patient Instructions (Addendum)
Medication Instructions: - Your physician has recommended you make the following change in your medication:   1) Start demadex (torsemide) 20 mg- take 1 tablet (20 mg) daily on Monday, Wednesday, & Fridays, you may take 1 extra tablet (20 mg) daily as needed for weight gain or worsening shortness of breath ( no refill sent on this yet per phone discussion with the patient_   2) Sildenafil has been refilled -  #270 pills, with refill Print coupon through goodrx.com  If you need a refill on your cardiac medications before your next appointment, please call your pharmacy.    Lab work: No new labs needed   If you have labs (blood work) drawn today and your tests are completely normal, you will receive your results only by: Marland Kitchen MyChart Message (if you have MyChart) OR . A paper copy in the mail If you have any lab test that is abnormal or we need to change your treatment, we will call you to review the results.   Testing/Procedures: No new testing needed   Follow-Up: At [facility], you and your health needs are our priority.  As part of our continuing mission to provide you with exceptional heart care, we have created designated Provider Care Teams.  These Care Teams include your primary Cardiologist (physician) and Advanced Practice Providers (APPs -  Physician Assistants and Nurse Practitioners) who all work together to provide you with the care you need, when you need it.  . You will need a follow up appointment in 1 month .   Please call our office 2 months in advance to schedule this appointment.    . Providers on your designated Care Team:   . Murray Hodgkins, NP . Christell Faith, PA-C . Marrianne Mood, PA-C  Any Other Special Instructions Will Be Listed Below (If Applicable).  For educational health videos Log in to : [email or web address] Or : [email or web address], password : [email or web address]

## 2018-10-09 NOTE — Progress Notes (Addendum)
Virtual Visit via Video Note   This visit type was conducted due to national recommendations for restrictions regarding the COVID-19 Pandemic (e.g. social distancing) in an effort to limit this patient's exposure and mitigate transmission in our community.  Due to her co-morbid illnesses, this patient is at least at moderate risk for complications without adequate follow up.  This format is felt to be most appropriate for this patient at this time.  All issues noted in this document were discussed and addressed.  A limited physical exam was performed with this format.  Please refer to the patient's chart for her consent to telehealth for [facility].    Date:  10/09/2018   ID:  Stephen [name], DOB Nov 09, 1945, MRN [id number]  Patient Location:  [place] [address]   Provider location:   [facility], [place] office  PCP:  [name], MD  Cardiologist:  Patsy Baltimore  Chief Complaint: Shortness of breath    History of Present Illness:    Stephen [name] is a 73 y.o. male who presents via audio/video conferencing for a telehealth visit today.   The patient does not symptoms concerning for COVID-19 infection (fever, chills, cough, or new SHORTNESS OF BREATH).   Patient has a past medical history of Morbid Obesity, coronary artery disease, PCI to the mid RCA and mid left circumflex in October 2007,  interstitial fibrosis on chronic oxygen,  wedge resection,  hypertension,  hyperlipidemia,  remote tobacco abuse,  chronic substernal chest pain  diabetes Open lung biopsy was performed November 2009 for bilateral pulmonary infiltrates. Status post stent to his left renal artery for 80+ percent stenosis several weeks ago, performed by Dr. Lucky Cowboy. laser treatment of the lower extremity veins for leg swelling/venous insufficiency who presents for routine followup of his coronary artery disease, pulmonary HTN,  and shortness of breath  Recent  hospitalization with discharge September 22, 2018 Hospital records reviewed with the patient in detail Syncope, severe pulmonary hypertension, hypoxia Acute on chronic renal failure exacerbated by diuresis Severe hypoxia even after aggressive diuresis Wife reported oxygen saturations high 70s at home prior to discharg if he didnot use his oxygen with walking around the house  Reason for hypoxia was felt to be multifactorial including interstitial lung disease, suggestion of pulmonary hypertension seen on echo Right heart pressures 50 mm per mercury even after aggressive diuresis He did not want to stay in the hospital for right heart catheterization, preferred to go home  On discussion with him at home today, Oxygen level 94 with oxygen Without oxygen: sitting and with movement low 90s "has not been in the 70s or 80s in a while" (since discharge from the hospital) Taking sildenafil TID, started at [facility] Now uses oxygen all the time "feels pretty good" Takes torsemide 20 mg on Mon/Wed/Fri Limited dosing secondary to renal dysfunction Weight 263 (stable since he has been home)  Other vitals discussed with him Blood pressure:119/58, Pulse 64  Lab work reviewed with him Labs 09/24/18 CR 2.03 BUN 38   Other records reviewed  Myoview in July 2019 showed region of old infarct, possible region of ischemia though significant attenuation artifact No catheterization at the time as he was relatively asymptomatic, had renal dysfunction  His catheterization in July 2018 did show elevated left ventricular end-diastolic pressure Prior echocardiogram also with elevated pressure  Prior CV studies:   The following studies were reviewed today:  Echo 09/21/18  2. The left ventricle has  mildly decreased systolic function, with an ejection fraction of 45 to 50%. The cavity size was normal. Select images with lateral and inferolateral wall hypokinesis. There is mildly increased left ventricular wall  thickness.  Left ventricular diastolic Doppler parameters are consistent with impaired relaxation.  3. The right ventricle has normal systolic function. The cavity was normal. There is no increase in right ventricular wall thickness. Right ventricular systolic pressure is moderately elevated with an estimated pressure of 49.9 mmHg.  4. Left atrial size was mildly dilated.  Past Medical History:  Diagnosis Date   Adrenal gland anomaly    Arthritis    CAD (coronary artery disease)    a. 04/2006 MI and PCI/stenting to mLCx & mRCA; b. 07/2009 Cath: patent LCX/RCA stents;  c. 12/2016 NSTEMI/Cath: LM 30, LAD 30p, 33m 30d, D1/2/3 min irregs, LCX 1061mSR, OM1 min irregs, RCA 20p, 10 ISR, 7096m0d, RPDA min irregs, RPAV 40-->Med Rx; d. 12/2017 MV: fixed lateral wall scar, mild anterior/anterior septal ischemia. EF 30-44%.   CKD (chronic kidney disease), stage III (HCC)    Colon polyps    DM2 (diabetes mellitus, type 2) (HCC)    insulin requiring   Dyslipidemia    GERD (gastroesophageal reflux disease)    History of echocardiogram    a. TTE 12/2016: EF 50-55%, mod concentric LVH, images inadequate for wall motion assessment, not technically sufficienct to allow for LV dias fxn, calcified mitral annulus    HTN (hypertension)    Kidney stones    Left Renal artery stenosis (HCCTenstrike  a. 07/2013 s/p PTA/stenting ([place]); b. 07/2017 Renal Duplex: No significant RAS.   Myocardial infarction (HCC)    OSA (obstructive sleep apnea)    Pulmonary fibrosis (HCCBartlett  a. 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   Recurrent UTI    Rotator cuff disorder    right   Skin cancer    head   Thyroid disorder    Venous insufficiency of both lower extremities    a. s/p laser treatment.   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   CARPAL TUNNEL RELEASE     bilateral   CATARACT EXTRACTION W/PHACO  Right 07/08/2018   Procedure: CATARACT EXTRACTION PHACO AND INTRAOCULAR LENS PLACEMENT (IOCLavaletteIGHT DIABETIC;  Surgeon: KinEulogio BearD;  [facility];  Laterality: Right;  Diabetic - insulin sleep apnea   CATARACT EXTRACTION W/PHACO Left 08/05/2018   Procedure: CATARACT EXTRACTION PHACO AND INTRAOCULAR LENS PLACEMENT (IOCLimaEFT DIABETIC;  Surgeon: KinEulogio BearD;  [facility];  Laterality: Left;  DIABETIC   CORONARY ANGIOPLASTY WITH STENT PLACEMENT  2007   x 2   KNEE SURGERY     right   LEFT HEART CATH AND CORONARY ANGIOGRAPHY N/A 01/11/2017   Procedure: Left Heart Cath and Coronary Angiography;  Surgeon: AriWellington HampshireD;  [facility];  Service: Cardiovascular;  Laterality: N/A;   LUNG BIOPSY     RENAL ARTERY STENT  2015   L   SKIN CANCER EXCISION     back of head     No outpatient medications have been marked as taking for the 10/09/18 encounter (Appointment) with GolMinna MerrittsD.     Allergies:   Calcium channel blockers   Social History   Tobacco Use   Smoking status: Never Smoker  Smokeless tobacco: Never Used  Substance Use Topics   Alcohol use: No    Alcohol/week: 0.0 standard drinks   Drug use: No     Current Outpatient Medications on File Prior to Visit  Medication Sig Dispense Refill   ACCU-CHEK SOFTCLIX LANCETS lancets Use as instructed to check blood sugar three times daily and as needed.  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 100 each 5   acetaminophen (TYLENOL) 325 MG tablet Take 2 tablets (650 mg total) by mouth every 6 (six) hours as needed for mild pain (or Fever >/= 101).     albuterol (PROVENTIL HFA;VENTOLIN HFA) 108 (90 Base) MCG/ACT inhaler Inhale 2 puffs into the lungs every 6 (six) hours as needed for wheezing or shortness of breath. (Patient not taking: Reported on 09/25/2018) 1 Inhaler 2   Alcohol Swabs (B-D SINGLE  USE SWABS REGULAR) PADS Use to cleans area prior to checking blood sugar three times daily and as needed.  Diagnosis:  E11.22  Insulin dependent. 100 each 5   amLODipine (NORVASC) 5 MG tablet Take 1 tablet (5 mg total) by mouth daily. 90 tablet 3   aspirin 81 MG tablet Take 324 mg by mouth daily.      atorvastatin (LIPITOR) 80 MG tablet Take 1 tablet (80 mg total) by mouth at bedtime.     benzonatate (TESSALON) 100 MG capsule Take 1-2 capsules (100-200 mg total) by mouth 3 (three) times daily as needed. 30 capsule 0   Blood Glucose Monitoring Suppl (ACCU-CHEK AVIVA PLUS) w/Device KIT Use to check blood sugar three times daily and as needed.  Diagnosis: E11.22  Insulin-dependent 1 kit 0   carvedilol (COREG) 12.5 MG tablet Take 0.5 tablets (6.25 mg total) by mouth 2 (two) times daily with a meal.     cloNIDine (CATAPRES) 0.3 MG tablet Take 1 tablet (0.3 mg total) by mouth 3 (three) times daily. 270 tablet 3   clopidogrel (PLAVIX) 75 MG tablet TAKE 1 TABLET EVERY DAY 90 tablet 0   diclofenac sodium (VOLTAREN) 1 % GEL Apply 2 g topically 2 (two) times daily as needed (for left knee pain.). 100 g 1   diphenhydrAMINE (BENADRYL) 50 MG tablet Take 50 mg by mouth at bedtime.      docusate sodium (COLACE) 100 MG capsule Take 100 mg by mouth daily as needed for mild constipation.      erythromycin ophthalmic ointment Place into the left eye every 6 (six) hours. 3.5 g 0   ezetimibe (ZETIA) 10 MG tablet Take 1 tablet (10 mg total) by mouth daily. 90 tablet 3   gabapentin (NEURONTIN) 800 MG tablet Take 1-1.5 tablets (800-1,200 mg total) by mouth See admin instructions. Take 1 tablet ('800mg'$ ) by mouth every morning and 1 tablets ('1200mg'$ ) every night at bedtime     glucose blood (ACCU-CHEK AVIVA PLUS) test strip Use as instructed to check blood sugar 3 times daily or as needed.  Diagnosis:  E11.22  Insulin-dependent. 300 each 1   insulin NPH-regular Human (NOVOLIN 70/30) (70-30) 100 UNIT/ML injection  Inject 50-70 Units into the skin See admin instructions. Inject 70u under the skin every morning and 50u under the skin at bedtime     Insulin Syringe-Needle U-100 (INSULIN SYRINGE 1CC/30GX5/16") 30G X 5/16" 1 ML MISC Use as directed to take insulin Dx 250.62 180 each 1   isosorbide mononitrate (IMDUR) 60 MG 24 hr tablet Take 1.5 tablets (90 mg total) by mouth 2 (two) times daily. 270 tablet 3   nitroGLYCERIN (NITROSTAT) 0.4  MG SL tablet Place 1 tablet (0.4 mg total) under the tongue every 5 (five) minutes as needed. May repeat x3 25 tablet prn   omeprazole (PRILOSEC) 20 MG capsule Take 20 mg by mouth daily.       oxyCODONE (ROXICODONE) 15 MG immediate release tablet Take 1 tablet (15 mg total) by mouth at bedtime as needed for pain. 30 tablet 0   potassium chloride (K-DUR) 10 MEQ tablet Take 1 tablet (10 mEq total) by mouth daily. 90 tablet 3   sildenafil (REVATIO) 20 MG tablet Take 1 tablet (20 mg total) by mouth 3 (three) times daily. 90 tablet 0   tamsulosin (FLOMAX) 0.4 MG CAPS capsule Take 1 capsule (0.4 mg total) by mouth at bedtime. 30 capsule 0   No current facility-administered medications on file prior to visit.      Family Hx: The patient's family history includes Arthritis in his maternal grandmother; Breast cancer in his maternal aunt; COPD in his sister; Diabetes in his maternal aunt; Heart attack in his brother, father, and mother; Hypertension in his maternal aunt and another family member. There is no history of Prostate cancer, Kidney cancer, Bladder Cancer, or Colon cancer.  ROS:   Please see the history of present illness.    Review of Systems  Constitutional: Negative.   Respiratory: Positive for shortness of breath.   Cardiovascular: Negative.   Gastrointestinal: Negative.   Musculoskeletal: Negative.   Neurological: Negative.   Psychiatric/Behavioral: Negative.   All other systems reviewed and are negative.    Labs/Other Tests and Data Reviewed:     Recent Labs: 09/20/2018: ALT 26; B Natriuretic Peptide 149.0; TSH 5.706 09/21/2018: Hemoglobin 14.1; Platelets 209 09/24/2018: BUN 38; Creatinine, Ser 2.03; Potassium 4.3; Sodium 141   Recent Lipid Panel Lab Results  Component Value Date/Time   CHOL 91 12/18/2017 09:15 AM   TRIG 111.0 12/18/2017 09:15 AM   HDL 23.80 (L) 12/18/2017 09:15 AM   CHOLHDL 4 12/18/2017 09:15 AM   LDLCALC 45 12/18/2017 09:15 AM    Wt Readings from Last 3 Encounters:  09/24/18 262 lb 1 oz (118.9 kg)  09/22/18 256 lb 2.8 oz (116.2 kg)  09/16/18 262 lb (118.8 kg)     Exam:    Vital Signs: Vital signs as detailed above in HPI  Well nourished, well developed male in no acute distress. Constitutional:  oriented to person, place, and time. No distress.    ASSESSMENT & PLAN:     1.Syncope My concern on his recent hospitalization was his hypoxia and walking around his house without supplemental oxygen with saturations low 80s, even high 70s as documented by his wife.  Presumably secondary to underlying interstitial lung disease, pulmonary hypertension Also I was concerned about him not wearing CPAP when napping in the daytime Recommended consistent use of oxygen when walking around the house and wearing CPAP when napping in the day -He was started on sildenafil in the hospital, reports saturations maintaining in the low 90s even with ambulation without oxygen.  Still very limited Scheduled to meet with pulmonary hypertension clinic tomorrow Suspect in the setting of viral outbreak, options will be limited such as right heart catheterization As he is stable, perhaps work-up could be deferred until later if he would like to pursue invasive studies  2.Hypoxia -Strongly recommended oxygen at all times patient on ambulation around his house -Recommended he wear CPAP when sleeping in his recliner watching late movies We have refilled his sildenafil  3)pulmonary hypertension Likely secondary to  underlying lung disease Right heart pressure 50 mmHg on echocardiogram even after overdiuresis, and was still hypoxic in the hospital (creatinine was 2.5 at the time) Plan as above, discussion with CHF clinic tomorrow  4)acute on chronic renal failure Slight improvement in renal function on most recent lab work, only taking torsemide 23 times a week Reports his weight is stable  COVID-19 Education: The signs and symptoms of COVID-19 were discussed with the patient and how to seek care for testing (follow up with PCP or arrange E-visit).  The importance of social distancing was discussed today.  Patient Risk:   After full review of this patients clinical status, I feel that they are at least moderate risk at this time.  Time:   Today, I have spent 25 minutes with the patient with telehealth technology discussing pulmonary hypertension, options, renal dysfunction, management of hypoxia.     Medication Adjustments/Labs and Tests Ordered: Current medicines are reviewed at length with the patient today.  Concerns regarding medicines are outlined above.   Tests Ordered: No tests ordered   Medication Changes: No changes made   Disposition: Follow-up in 1 month   Signed, Ida Rogue, MD  10/09/2018 1:58 PM    [place] Office [address] [place] [address], [place],  [address]

## 2018-10-10 ENCOUNTER — Ambulatory Visit (HOSPITAL_COMMUNITY)
Admission: RE | Admit: 2018-10-10 | Discharge: 2018-10-10 | Disposition: A | Discharge status: Home / Self Care | Payer: Medicare HMO | Source: Ambulatory Visit | Attending: Internal Medicine | Admitting: Internal Medicine

## 2018-10-10 ENCOUNTER — Other Ambulatory Visit: Payer: Self-pay

## 2018-10-10 DIAGNOSIS — I270 Primary pulmonary hypertension: Secondary | ICD-10-CM | POA: Diagnosis not present

## 2018-10-10 DIAGNOSIS — I5032 Chronic diastolic (congestive) heart failure: Secondary | ICD-10-CM

## 2018-10-10 DIAGNOSIS — R0602 Shortness of breath: Secondary | ICD-10-CM

## 2018-10-10 NOTE — Addendum Note (Signed)
Encounter addended by: Marlise Eves, RN on: 10/10/2018 11:34 AM  Actions taken: Visit diagnoses modified, Order list changed, Diagnosis association updated, Clinical Note Signed

## 2018-10-10 NOTE — Progress Notes (Signed)
Spoke to patient about after visit summary instructions. Pt is agreeable and verbalized understanding. Denied any need for refills as he had cardiac meds refilled from another provider.

## 2018-10-10 NOTE — Progress Notes (Signed)
Heart Failure TeleHealth Note  Due to national recommendations of social distancing due to [place] 19, Audio/video telehealth visit is felt to be most appropriate for this patient at this time.  See MyChart message from today for patient consent regarding telehealth for [facility].  Date:  10/10/2018   ID:  Stephen [name], DOB 1946/04/11, MRN [id number]  Location: Home  Provider location: [facility] Type of Visit: New patient  PCP:  [name], MD  Cardiologist:  Ida Rogue, MD Primary HF:   Chief Complaint: Pulmonary HTN   History of Present Illness:  Stephen [name] is a 73 y.o. male with multiple medical problems including   1. Morbid obesity 2  Pulmonary fibrosis s/p wedge resection 3. 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 OSA on CPAP 5. DM 6  CKD (baseline creatinine 2.0-2.5) 7.  Left renal arrtery stenosis s/p stenting,  8.  Chronic diastolic HF, EF 39-53% in 3/20 9.  CAD with chronic CP s/p stenting of his RCA/LCX.   -Cath 7/18  1. Significant 2 vessel coronary artery disease with occluded stent in the mid left circumflex with collaterals noted from the LAD and right coronary artery. Patent RCA stent with 70% stenosis distal to the stent in the midsegment and 50% stenosis in the distal segment. Moderate LAD disease.  2. Moderately elevated left ventricular end-diastolic pressure with LVEDP of 25 mmHg.  Referred by Dr. Rockey Situ for further evaluation of Pulmonary HTN  He presents via audio/video conferencing for a telehealth visit today.    Was hospitalized in 3/20 with syncope. Work-up revealed pulmonary HTN by echo and severe hypoxia even after aggressive diuresis. Wife reported oxygen saturations high 70s at home if he didnot use his oxygen with walking around the house. Right heart pressures 50 mm per mercury on echo even after aggressive diuresis. He did not want to stay in the hospital for right  heart catheterization, preferred to go home. Started on sildenafil 20 tid  Echo 09/12/18: EF 45-50% Normal RV. Mild TR. RVSP 18mHg  Had televisit with Dr. GRockey Situyesterday. O2 sats upper 90s with oxygen. Without oxygen: sitting and with movement low 90s Takes torsemide 20 mg on Mon/Wed/Fri. Weight 263 (stable since he has been home)  Feeling much better. Wearing oxygen and CPAP regularly. Able to get around the house pretty good. Able to walk back and forth to mailbox and says he wants to use the chainsaw. Swelling well controlled. Takes extra torsemide as needed.   Other vitals discussed with him Blood pressure:119/58, Pulse 64  Lab work reviewed with him Labs 09/24/18 CR 2.03 BUN 38   RBALRAJ BRAYFIELDdenies symptoms worrisome for COVID 19.   Past Medical History:  Diagnosis Date  . Adrenal gland anomaly   . Arthritis   . CAD (coronary artery disease)    a. 04/2006 MI and PCI/stenting to mLCx & mRCA; b. 07/2009 Cath: patent LCX/RCA stents;  c. 12/2016 NSTEMI/Cath: LM 30, LAD 30p, 59m30d, D1/2/3 min irregs, LCX 10034mR, OM1 min irregs, RCA 20p, 10 ISR, 35m24md, RPDA min irregs, RPAV 40-->Med Rx; d. 12/2017 MV: fixed lateral wall scar, mild anterior/anterior septal ischemia. EF 30-44%.  . CKD (chronic kidney disease), stage III (HCC)[place]. Colon polyps   . DM2 (diabetes mellitus, type 2) (HCC)    insulin requiring  . Dyslipidemia   . GERD (gastroesophageal reflux disease)   . History of echocardiogram  a. TTE 12/2016: EF 50-55%, mod concentric LVH, images inadequate for wall motion assessment, not technically sufficienct to allow for LV dias fxn, calcified mitral annulus   . HTN (hypertension)   . Kidney stones   . Left Renal artery stenosis ([place])    a. 07/2013 s/p PTA/stenting ([place]); b. 07/2017 Renal Duplex: No significant RAS.  Marland Kitchen Myocardial infarction ([place])   . OSA (obstructive sleep apnea)   . Pulmonary fibrosis ([place])    a. 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  . Recurrent UTI   . Rotator cuff disorder    right  . Skin cancer    head  . Thyroid disorder   . Venous insufficiency of both lower extremities    a. s/p laser treatment.   Past Surgical History:  Procedure Laterality Date  . adrenal adenoma removal  1990-   right  . CARPAL TUNNEL RELEASE     bilateral  . CATARACT EXTRACTION W/PHACO Right 07/08/2018   Procedure: CATARACT EXTRACTION PHACO AND INTRAOCULAR LENS PLACEMENT (IOC) RIGHT DIABETIC;  Surgeon: Eulogio Bear, MD;  Location: [place];  Service: Ophthalmology;  Laterality: Right;  Diabetic - insulin sleep apnea  . CATARACT EXTRACTION W/PHACO Left 08/05/2018   Procedure: CATARACT EXTRACTION PHACO AND INTRAOCULAR LENS PLACEMENT ([place]) LEFT DIABETIC;  Surgeon: Eulogio Bear, MD;  Location: [place];  Service: Ophthalmology;  Laterality: Left;  DIABETIC  . CORONARY ANGIOPLASTY WITH STENT PLACEMENT  2007   x 2  . KNEE SURGERY     right  . LEFT HEART CATH AND CORONARY ANGIOGRAPHY N/A 01/11/2017   Procedure: Left Heart Cath and Coronary Angiography;  Surgeon: Wellington Hampshire, MD;  Location: [employer] [facility];  Service: Cardiovascular;  Laterality: N/A;  . LUNG BIOPSY    . RENAL ARTERY STENT  2015   L  . SKIN CANCER EXCISION     back of head     Current Outpatient Medications  Medication Sig Dispense Refill  . ACCU-CHEK SOFTCLIX LANCETS lancets Use as instructed to check blood sugar three times daily and as needed.  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 100 each 5  . acetaminophen (TYLENOL) 325 MG tablet Take 2 tablets (650 mg total) by mouth every 6 (six) hours as needed for mild pain (or Fever >/= 101).    Marland Kitchen albuterol (PROVENTIL HFA;VENTOLIN HFA) 108 (90 Base) MCG/ACT inhaler Inhale 2 puffs into the lungs every 6 (six) hours as needed for wheezing or shortness of breath. 1 Inhaler 2  . Alcohol Swabs (B-D SINGLE USE SWABS REGULAR) PADS Use to cleans area  prior to checking blood sugar three times daily and as needed.  Diagnosis:  E11.22  Insulin dependent. 100 each 5  . amLODipine (NORVASC) 5 MG tablet Take 1 tablet (5 mg total) by mouth daily. 90 tablet 3  . aspirin 81 MG tablet Take 324 mg by mouth daily.     Marland Kitchen atorvastatin (LIPITOR) 80 MG tablet Take 1 tablet (80 mg total) by mouth at bedtime.    . benzonatate (TESSALON) 100 MG capsule Take 1-2 capsules (100-200 mg total) by mouth 3 (three) times daily as needed. 30 capsule 0  . Blood Glucose Monitoring Suppl (ACCU-CHEK AVIVA PLUS) w/Device KIT Use to check blood sugar three times daily and as needed.  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 carvedilol (COREG) 12.5 MG tablet Take 0.5 tablets (6.25 mg total) by mouth 2 (two) times daily with a meal.    .  cloNIDine (CATAPRES) 0.3 MG tablet Take 1 tablet (0.3 mg total) by mouth 3 (three) times daily. 270 tablet 3  . clopidogrel (PLAVIX) 75 MG tablet TAKE 1 TABLET EVERY DAY 90 tablet 0  . diclofenac sodium (VOLTAREN) 1 % GEL Apply 2 g topically 2 (two) times daily as needed (for left knee pain.). 100 g 1  . diphenhydrAMINE (BENADRYL) 50 MG tablet Take 50 mg by mouth at bedtime.     . docusate sodium (COLACE) 100 MG capsule Take 100 mg by mouth daily as needed for mild constipation.     Marland Kitchen erythromycin ophthalmic ointment Place into the left eye every 6 (six) hours. 3.5 g 0  . ezetimibe (ZETIA) 10 MG tablet Take 1 tablet (10 mg total) by mouth daily. 90 tablet 3  . gabapentin (NEURONTIN) 800 MG tablet Take 1-1.5 tablets (800-1,200 mg total) by mouth See admin instructions. Take 1 tablet ('800mg'$ ) by mouth every morning and 1 tablets ('1200mg'$ ) every night at bedtime    . glucose blood (ACCU-CHEK AVIVA PLUS) test strip Use as instructed to check blood sugar 3 times daily or as needed.  Diagnosis:  E11.22  Insulin-dependent. 300 each 1  . insulin NPH-regular Human (NOVOLIN 70/30) (70-30) 100 UNIT/ML injection Inject 50-70 Units into the skin See admin  instructions. Inject 70u under the skin every morning and 50u under the skin at bedtime    . Insulin Syringe-Needle U-100 (INSULIN SYRINGE 1CC/30GX5/16") 30G X 5/16" 1 ML MISC Use as directed to take insulin Dx 250.62 180 each 1  . isosorbide mononitrate (IMDUR) 60 MG 24 hr tablet Take 1.5 tablets (90 mg total) by mouth 2 (two) times daily. 270 tablet 3  . nitroGLYCERIN (NITROSTAT) 0.4 MG SL tablet Place 1 tablet (0.4 mg total) under the tongue every 5 (five) minutes as needed. May repeat x3 25 tablet prn  . omeprazole (PRILOSEC) 20 MG capsule Take 20 mg by mouth daily.      Marland Kitchen oxyCODONE (ROXICODONE) 15 MG immediate release tablet Take 1 tablet (15 mg total) by mouth at bedtime as needed for pain. 30 tablet 0  . potassium chloride (K-DUR) 10 MEQ tablet Take 1 tablet (10 mEq total) by mouth daily. 90 tablet 3  . sildenafil (REVATIO) 20 MG tablet Take 1 tablet (20 mg total) by mouth 3 (three) times daily. 270 tablet 1  . tamsulosin (FLOMAX) 0.4 MG CAPS capsule Take 1 capsule (0.4 mg total) by mouth at bedtime. 30 capsule 0  . torsemide (DEMADEX) 20 MG tablet Take 1 tab (20 mg) by mouth daily on Mon, Wed, & Fri. You may take 1 extra tab (20 mg) daily as needed for weight gain/ shortness of breath     No current facility-administered medications for this encounter.     Allergies:   Calcium channel blockers   Social History:  The patient  reports that he has never smoked. He has never used smokeless tobacco. He reports that he does not drink alcohol or use drugs.   Family History:  The patient's family history includes Arthritis in his maternal grandmother; Breast cancer in his maternal aunt; COPD in his sister; Diabetes in his maternal aunt; Heart attack in his brother, father, and mother; Hypertension in his maternal aunt and another family member.   ROS:  Please see the history of present illness.   All other systems are personally reviewed and negative.   Exam:  (Video/Tele Health Call; Exam is  subjective and or/visual.) General:  Speaks in full sentences.  No resp difficulty. Lungs: Normal respiratory effort with conversation.  Abdomen: Non-distended per patient report Extremities: Pt denies edema. Neuro: Alert & oriented x 3.   Recent Labs: 09/20/2018: ALT 26; B Natriuretic Peptide 149.0; TSH 5.706 09/21/2018: Hemoglobin 14.1; Platelets 209 09/24/2018: BUN 38; Creatinine, Ser 2.03; Potassium 4.3; Sodium 141  Personally reviewed   Wt Readings from Last 3 Encounters:  09/24/18 118.9 kg (262 lb 1 oz)  09/22/18 116.2 kg (256 lb 2.8 oz)  09/16/18 118.8 kg (262 lb)      ASSESSMENT AND PLAN:  1.Pulmonary HTN - mild to moderate by echo 3/20 with RVSP ~50 after diuresis - Likely a combination of WHO Group II (HF) and III (hypoxic lung disease) - I stressed the need for the following      1. Wear O2 and CPAP religiously to keep sats >= 90%      2. Volume and BP control. Cut back water intake and monitor fluids levels. Can take extra torsemide as needed      3. Lose weight. Discussed [facility] Diet - Will need ABG and PFTs with DLCO at [facility] - Consider RHC (after COVID crisis) as echo can underestimate pulmonary pressures - Refer to Pulmonary Rehab  (when it reopens) - Can continue sildenafil if he feels it is helping - F/u with me in [facility] 3-4 months  COVID screen The patient does not have any symptoms that suggest any further testing/ screening at this time.  Social distancing reinforced today.  Recommended follow-up:  As above  Relevant cardiac medications were reviewed at length with the patient today.   The patient does not have concerns regarding their medications at this time.   The following changes were made today:  As above  Today, I have spent 35 minutes with the patient with telehealth technology discussing the above issues .    Signed, Glori Bickers, MD  10/10/2018 9:40 AM  Advanced Heart Failure [place]  [place] [address] [phone] (office) [phone] (fax)

## 2018-10-10 NOTE — Patient Instructions (Signed)
Your physician has recommended that you have a pulmonary function test. Pulmonary Function Tests are a group of tests that measure how well air moves in and out of your lungs. This will be done at [facility]. They will be in contact with you in order to set up an appointment.  You have been referred to Pulmonary Rehab at [facility]. They will be in contact with you in order to set up an appointment.  Please follow up with Dr. Haroldine Laws in 3-4 months.

## 2018-10-17 ENCOUNTER — Other Ambulatory Visit: Payer: Self-pay | Admitting: *Deleted

## 2018-10-17 NOTE — Patient Outreach (Signed)
[facility] [facility]) Care Management  10/17/2018  Stephen [name] Jul 17, 1945 [id number]   Telephone follow up assessment    Referral received: 3/23 Referral source: [facility] hospital liasion Referral reason : complex care management  Insurance : [facility]   Transition of care by PCP office   Patient with [facility] hospital admission 3/20-3/22 Dx: Syncope, orthostatic hypotension, diastolic heart failure, Noncompliance with oxygen and CPAP.   Chart reviewed for PMHX; includes but not limited to COPD, Heart failure , sleep apnea , pulmonary fibrosis, Diabetes , Chronic kidney disease stage 3.   Successful outreach call to patient, able to speak with patient wife she discussed patient is doing pretty good and further discussed.   Heart failure Recent weight 265, has not weighed on today, reports decreased swelling in legs if he is able to rest in the bed at night. Reviewed worsening symptoms of heart failure, sudden weight gain, of 3 pound in a day, 5 in a week, increased shortness of breath or swelling, denies having to take fluid pill .  COPD,   Patient continues to wear oxygen at all times with oxygen levels remaining in the 90's. Patient wears cpap at night and during the day while napping. Denies increase in shortness of breath, cough or wheezing.  Fall Denies recent falls, patient sleeps mostly in the chair at night , rest better in the bed, but has shoulder discomfort when he does. Discussed notifying MD if continued discomfort.  Patient tolerating increased movement in home, wife reports that patient usually prepares his own breakfast.  Diabetes Wife reports patient with low blood sugar of 66 this morning, unsure why lower this morning states patient has had diabetes for over 20 years recent rare episodes of low blood sugar. They treated low reading with orange juice and then he had his breakfast, patient has not rechecked. Reviewed rule of 15 for low blood sugar treatment ,  wife states that they are familiar with symptoms of low blood sugar and how to treat. Reinforced notifying PCP of increased episodes of low blood sugar.   Wife denies any new concerns at this time.   Plan  Will continue education and support of chronic medical conditions  Will send heart failure packet,  Will plan return call in the next 2 weeks.    THN CM Care Plan Problem One     Most Recent Value  Care Plan Problem One  High risk for readmission related to recent discharge , and readmission for syncope,hypoxia,new diagnosis of pulmonary hypertenison  heart failure   Role Documenting the Problem One  Care Management [place] for Problem One  Active  THN Long Term Goal   Patient would not experience hospital admission over the next 60 days   THN Long Term Goal Start Date  09/25/18  Interventions for Problem One Long Term Goal  Reviewed current clinical state, discussed heart failure self care managment skills of taking medications daily, monitoring weights, being as active as tolerated in home, limiting salt in diet to prevent fluid weight gain.   THN CM Short Term Goal #1   Over the next 30 days patient will be able to report wearing oxygen when up and CPAP while sleeping .   THN CM Short Term Goal #1 Start Date  09/25/18  Interventions for Short Term Goal #1  Reinforced continuing to wear oxygen   THN CM Short Term Goal #2   Patient will be able to reports monitoring weight on a daily  basis and keeping a record.   THN CM Short Term Goal #2 Start Date  09/25/18  Interventions for Short Term Goal #2  encouraged monitoring weights daily to identify sudden changes in weight sooner.   THN CM Short Term Goal #3  Patient/wife will be able to identify at least 3 symptoms of worsening heart/lung conditon   THN CM Short Term Goal #3 Start Date  10/03/18  Interventions for Short Tern Goal #3  Reinforced worsening symptoms of heart failure yellow zone symptoms wiill send heart failure  packet.       Joylene Draft, RN, [place] Management Coordinator  [phone]- Mobile [phone]- Toll Free Main Office

## 2018-10-24 ENCOUNTER — Telehealth: Payer: Self-pay

## 2018-10-24 DIAGNOSIS — J982 Interstitial emphysema: Secondary | ICD-10-CM | POA: Diagnosis not present

## 2018-10-24 MED ORDER — OXYCODONE HCL 15 MG PO TABS: 15.0000 mg | ORAL_TABLET | Freq: Every day | ORAL | 0 refills | Status: DC | PRN

## 2018-10-24 NOTE — Telephone Encounter (Signed)
Pt is aware as instructed and expressed understanding 

## 2018-10-24 NOTE — Telephone Encounter (Signed)
Pt has pinched nerve and arthritis in back and pt thinks he may have turned or twisted wrong. Pt said pain on lt lower back is 7 - 8 pain level mostly all the time; the pain does not radiate anywhere; pain started suddenly 3 - 4 days ago after mowing and moving limbs. When sitting uses heating pad and that helps pain some but when moves the pain level shoots back to an 8. No UTI symptoms. [facility] [place]. Pt wants me to send note to Dr [name] before trying to schedule an appt. Pt request cb.

## 2018-10-24 NOTE — Telephone Encounter (Signed)
We should avoid steroids and nsaids at this point.  He is already on gabapentin.   He has known chronic back pain and has used oxycodone in the past.  I sent a refill on his oxycodone for use at this point, with sedation caution.  If he isn't better with that and heat, then he needs recheck at the clinic.   If any emergent sx in the meantime (weakness, etc), then needs ER eval.   Update me as needed.  Thanks

## 2018-10-31 ENCOUNTER — Other Ambulatory Visit: Payer: Self-pay | Admitting: *Deleted

## 2018-10-31 NOTE — Patient Outreach (Addendum)
[facility]) Care Management  10/31/2018  ASANTE BLANDA 1945/07/24 [id number]   Telephone follow up assessment   Referral received: 3/23 Referral source: [facility] hospital liasion Referral reason : complex care management  Insurance : [facility]   Transition of care by PCP office   Patient with [facility] hospital admission 3/20-3/22 Dx: Syncope, orthostatic hypotension, diastolic heart failure, Noncompliance with oxygen and CPAP.   Chart reviewed for PMHX; includes but not limited to COPD, Heart failure , sleep apnea , pulmonary fibrosis, Diabetes , Chronic kidney disease stage 3.  Successful outreach call to patient ,he reports feeling pretty good on today. He states he is out sitting in the car while his wife is in [facility] she does not let him get out.  Patient reports getting a little stronger.  He further discussed :  Heart failure /Pulmonary hypertension.  Patient reports that he keeping track of his weight , report weights staying around 263, no sudden increase in weights of 2 pounds in a day or 5 in a week.  Patient discussed that his legs a little in the morning but has some swelling throughout the day. Discussed daily fluid intake recommended daily limits. Patient discussed not using salt except a little on his eggs in the morning.  Patient discussed further testing and follow up regarding pulmonary hypertension once covid restrictions opens up.  COPD/Sleep [place] Patient reports wearing oxygen at all times when, napping during the day, when sleeping at night.  Patient reports getting somewhat winded when moving around but improves rest.  Back Pain  Reports some improvement , tolerating sleeping in the bed at night.   Plan Will continue education and support of chronic conditions heart failure,  Review of action plan on worsening symptoms of heart failure notifying MD sooner for worsening symptoms .  Will plan follow up call in the next month.  Will  send EMMI handout on pulmonary hypertension .   THN CM Care Plan Problem One     Most Recent Value  Care Plan Problem One  High risk for readmission related to recent discharge , and readmission for syncope,hypoxia,new diagnosis of pulmonary hypertenison  heart failure   Role Documenting the Problem One  Care Management [place] for Problem One  Active  THN Long Term Goal   Patient would not experience hospital admission over the next 60 days   THN Long Term Goal Start Date  09/25/18  Interventions for Problem One Long Term Goal  Discussed with patient current clinical symptoms , reinforced notifying MD sooner for sudden weight gain teach back on action plan when to call.   THN CM Short Term Goal #1   Over the next 30 days patient will be able to report wearing oxygen when up and CPAP while sleeping .   THN CM Short Term Goal #1 Start Date  09/25/18  THN CM Short Term Goal #1 Met Date  10/31/18  THN CM Short Term Goal #2   Patient will be able to reports monitoring weight on a daily basis and keeping a record.   THN CM Short Term Goal #2 Start Date  09/25/18  [facility] CM Short Term Goal #2 Met Date  10/31/18  THN CM Short Term Goal #3  Patient/wife will be able to identify at least 3 symptoms of worsening heart/lung conditon   THN CM Short Term Goal #3 Start Date  10/03/18  Interventions for Short Tern Goal #3  Verified patient recieved heart failure packet ,  review of zone education and identify current zone   [facility] CM Short Term Goal #4  Patient will be able to identify at least 2 high salt foods to limit in diet over the next 30 days   THN CM Short Term Goal #4 Start Date  10/31/18  Interventions for Short Term Goal #4  Reviewed daily recommended fluid limits of less than 2 liters and 2 gram of salt referring to handout in HF packet.       Joylene Draft, RN, [place] Management Coordinator  [phone]- Mobile [phone]- Toll Free Main  Office

## 2018-11-05 ENCOUNTER — Ambulatory Visit: Admission: RE | Admit: 2018-11-05 | Payer: Medicare HMO | Source: Home / Self Care | Admitting: Ophthalmology

## 2018-11-05 ENCOUNTER — Encounter: Admission: RE | Payer: Self-pay | Source: Home / Self Care

## 2018-11-05 SURGERY — BLEPHAROPLASTY
Anesthesia: Monitor Anesthesia Care | Laterality: Bilateral

## 2018-11-12 NOTE — Progress Notes (Signed)
Virtual Visit via Video Note   This visit type was conducted due to national recommendations for restrictions regarding the COVID-19 Pandemic (e.g. social distancing) in an effort to limit this patient's exposure and mitigate transmission in our community.  Due to his co-morbid illnesses, this patient is at least at moderate risk for complications without adequate follow up.  This format is felt to be most appropriate for this patient at this time.  All issues noted in this document were discussed and addressed.  A limited physical exam was performed with this format.  Please refer to the patient's chart for his consent to telehealth for [facility].   I connected with  Stephen [name] on 11/13/18 by a video enabled telemedicine application and verified that I am speaking with the correct person using two identifiers. I discussed the limitations of evaluation and management by telemedicine. The patient expressed understanding and agreed to proceed.   Evaluation Performed:  Follow-up visit  Date:  11/13/2018   ID:  Stephen [name], DOB [birth year], MRN [id number]  Patient Location:  [place] [address]   Provider location:   [facility], [place] office  PCP:  [name], MD  Cardiologist:  Patsy Baltimore   Chief Complaint: Shortness of breath    History of Present Illness:    Stephen [name] is a 73 y.o. male who presents via audio/video conferencing for a telehealth visit today.   The patient does not symptoms concerning for COVID-19 infection (fever, chills, cough, or new SHORTNESS OF BREATH).   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 Status post stent to his left renal artery for 80+ percent stenosis several weeks ago, performed by Dr. Lucky Cowboy. laser treatment of the lower extremity veins for leg swelling/venous insufficiency who presents for routine followup of his coronary artery disease and shortness of breath  Chronic shortness of breath Last echocardiogram March 2020 ejection fraction 45 to 50% moderately elevated right heart pressures estimated at 50 mmHg  Stable chronic angina Previous recommendation not to walk around the house without his oxygen  Catheterization previously deferred secondary to renal dysfunction In general takes torsemide 40 daily, occasionally 40 twice daily  Scheduled for PFTs, and RHC  At home, 94 to 98% with oxygen  Some times in the 90s without oxygen  No near syncope or syncope  Weight 262-263, holding steady Taking torsemide daily , alternating 20 mg -  21m  Daily  Swelling better in the AM, swelling in the PM  Recent hospitalization March 2020 with acute respiratory distress O2 sat down to 80% on room air with oxygen walking around his house,  Syncope, fell through a wall  upper respiratory infection  short course of prednisone, made his sugars run high    Myoview in July 2019 showed 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 He has had a total of 6 shots so far.   Prior catheterization in July 2018 did show elevated left ventricular end-diastolic pressure Prior  echocardiogram also with elevated pressure  Blood pressure 124/60 Total Chol 91/ LDL 45 CR 1.96    Prior CV studies:   The following studies were reviewed today:  Stress test ordered for shortness of breath Mages pulled up and discussed with him  defect lateral wall Known  occluded left circumflex vessel Ejection fraction 30-45%  Cath 01/11/2017, occluded LCX, with collaterals Distal RCA stenosis in-stent 70%, medical management at this time  Echocardiogram ejection fraction 50-55%  Stress test July 2011 was dobutamine stress that showed no significant ischemia, inadequate heart rate was achieved. right and left heart catheterization January 2011 showed 50% mid LAD disease, 60% at the ostium of the diagonal #2, 30% proximal left circumflex disease, patent stent in the left circumflex, patent stent of the mid RCA, wedge pressure of 16, PA pressure mean 22, right ventricular pressure 41/8 .  echocardiogram September 2010 shows normal systolic function, diastolic dysfunction, normal RV function unable to evaluate right ventricular systolic pressures   Past Medical History:  Diagnosis Date  . Adrenal gland anomaly   . Arthritis   . CAD (coronary artery disease)    a. 04/2006 MI and PCI/stenting to mLCx & mRCA; b. 07/2009 Cath: patent LCX/RCA stents;  c. 12/2016 NSTEMI/Cath: LM 30, LAD 30p, 58m 30d, D1/2/3 min irregs, LCX 1024mSR, OM1 min irregs, RCA 20p, 10 ISR, 702m0d, RPDA min irregs, RPAV 40-->Med Rx; d. 12/2017 MV: fixed lateral wall scar, mild anterior/anterior septal ischemia. EF 30-44%.  . CKD (chronic kidney disease), stage III ([place] . Colon polyps   . DM2 (diabetes mellitus, type 2) (HCC)    insulin requiring  . Dyslipidemia   . GERD (gastroesophageal reflux disease)   . History of echocardiogram    a. TTE 12/2016: EF 50-55%, mod concentric LVH, images inadequate for wall motion assessment, not technically sufficienct to allow for LV dias fxn, calcified mitral annulus   . HTN (hypertension)   . Kidney stones   . Left Renal artery stenosis ([place]  a. 07/2013 s/p PTA/stenting ([place]); b. 07/2017 Renal Duplex: No significant RAS.  . [place] Kitchenocardial infarction ([place]CC[place] . OSA (obstructive sleep apnea)   . Pulmonary fibrosis ([place]  a. 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  . Recurrent UTI   . Rotator cuff disorder    right  . Skin cancer    head  . Thyroid disorder   . Venous insufficiency of both lower extremities    a. s/p laser treatment.   Past Surgical History:  Procedure Laterality Date  . adrenal adenoma removal  1990-   right  . CARPAL TUNNEL RELEASE     bilateral  . CATARACT EXTRACTION W/PHACO Right 07/08/2018   Procedure: CATARACT EXTRACTION PHACO AND INTRAOCULAR LENS PLACEMENT (IOC) RIGHT DIABETIC;  Surgeon: KinEulogio BearD;  [place] [place];  Laterality: Right;  Diabetic - insulin sleep apnea  . CATARACT EXTRACTION W/PHACO Left 08/05/2018   Procedure: CATARACT EXTRACTION PHACO AND INTRAOCULAR LENS PLACEMENT (IOCFolly BeachEFT DIABETIC;  Surgeon: KinEulogio BearD;  [facility];  Laterality: Left;  DIABETIC  . CORONARY ANGIOPLASTY WITH STENT PLACEMENT  2007   x 2  . KNEE SURGERY     right  . LEFT HEART CATH AND CORONARY ANGIOGRAPHY N/A 01/11/2017   Procedure: Left Heart Cath and Coronary Angiography;  Surgeon: AriWellington HampshireD;  [facility];  Service: Cardiovascular;  Laterality: N/A;  . LUNG BIOPSY    . RENAL ARTERY STENT  2015   L  . SKIN CANCER EXCISION     back of head     No outpatient medications have been marked as taking for the 11/13/18 encounter (Appointment) with Minna Merritts, MD.     Allergies:   Calcium channel blockers   Social History   Tobacco Use  . Smoking status: Never Smoker  . Smokeless tobacco: Never Used  Substance Use Topics  . Alcohol use: No    Alcohol/week: 0.0 standard drinks  . Drug use: No     Current Outpatient Medications on File Prior to Visit  Medication Sig Dispense Refill  . ACCU-CHEK SOFTCLIX LANCETS lancets Use as instructed to check blood sugar three times daily and as needed.  Diagnosis: E11.22   Insulin-dependent. 100 each 5  . acetaminophen (TYLENOL) 325 MG tablet Take 2 tablets (650 mg total) by mouth every 6 (six) hours as needed for mild pain (or Fever >/= 101).    Marland Kitchen albuterol (PROVENTIL HFA;VENTOLIN HFA) 108 (90 Base) MCG/ACT inhaler Inhale 2 puffs into the lungs every 6 (six) hours as needed for wheezing or shortness of breath. 1 Inhaler 2  . Alcohol Swabs (B-D SINGLE USE SWABS REGULAR) PADS Use to cleans area prior to checking blood sugar three times daily and as needed.  Diagnosis:  E11.22  Insulin dependent. 100 each 5  . amLODipine (NORVASC) 5 MG tablet Take 1 tablet (5 mg total) by mouth daily. 90 tablet 3  . aspirin 81 MG tablet Take 324 mg by mouth daily.     Marland Kitchen atorvastatin (LIPITOR) 80 MG tablet Take 1 tablet (80 mg total) by mouth at bedtime.    . benzonatate (TESSALON) 100 MG capsule Take 1-2 capsules (100-200 mg total) by mouth 3 (three) times daily as needed. 30 capsule 0  . Blood Glucose Monitoring Suppl (ACCU-CHEK AVIVA PLUS) w/Device KIT Use to check blood sugar three times daily and as needed.  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 carvedilol (COREG) 12.5 MG tablet Take 0.5 tablets (6.25 mg total) by mouth 2 (two) times daily with a meal.    . cloNIDine (CATAPRES) 0.3 MG tablet Take 1 tablet (0.3 mg total) by mouth 3 (three) times daily. 270 tablet 3  . clopidogrel (PLAVIX) 75 MG tablet TAKE 1 TABLET EVERY DAY 90 tablet 0  . diclofenac sodium (VOLTAREN) 1 % GEL Apply 2 g topically 2 (two) times daily as needed (for left knee pain.). 100 g 1  . diphenhydrAMINE (BENADRYL) 50 MG tablet Take 50 mg by mouth at bedtime.     . docusate sodium (COLACE) 100 MG capsule Take 100 mg by mouth daily as needed for mild constipation.     Marland Kitchen erythromycin ophthalmic ointment Place into the left eye every 6 (six) hours. 3.5 g 0  . ezetimibe (ZETIA) 10 MG tablet Take 1 tablet (10 mg total) by mouth daily. 90 tablet 3  . gabapentin (NEURONTIN) 800 MG tablet Take 1-1.5 tablets  (800-1,200 mg total) by mouth See admin instructions. Take 1 tablet (830m) by mouth every morning and 1 tablets (12075m every night at bedtime    . glucose blood (ACCU-CHEK AVIVA PLUS) test strip Use as instructed to check blood sugar 3 times daily or as needed.  Diagnosis:  E11.22  Insulin-dependent. 300 each 1  . insulin NPH-regular Human (NOVOLIN 70/30) (70-30) 100 UNIT/ML injection Inject 50-70 Units into the  skin See admin instructions. Inject 70u under the skin every morning and 50u under the skin at bedtime    . Insulin Syringe-Needle U-100 (INSULIN SYRINGE 1CC/30GX5/16") 30G X 5/16" 1 ML MISC Use as directed to take insulin Dx 250.62 180 each 1  . isosorbide mononitrate (IMDUR) 60 MG 24 hr tablet Take 1.5 tablets (90 mg total) by mouth 2 (two) times daily. 270 tablet 3  . nitroGLYCERIN (NITROSTAT) 0.4 MG SL tablet Place 1 tablet (0.4 mg total) under the tongue every 5 (five) minutes as needed. May repeat x3 25 tablet prn  . omeprazole (PRILOSEC) 20 MG capsule Take 20 mg by mouth daily.      Marland Kitchen oxyCODONE (ROXICODONE) 15 MG immediate release tablet Take 1 tablet (15 mg total) by mouth daily as needed for pain. 30 tablet 0  . potassium chloride (K-DUR) 10 MEQ tablet Take 1 tablet (10 mEq total) by mouth daily. 90 tablet 3  . sildenafil (REVATIO) 20 MG tablet Take 1 tablet (20 mg total) by mouth 3 (three) times daily. 270 tablet 1  . tamsulosin (FLOMAX) 0.4 MG CAPS capsule Take 1 capsule (0.4 mg total) by mouth at bedtime. 30 capsule 0  . torsemide (DEMADEX) 20 MG tablet Take 1 tab (20 mg) by mouth daily on Mon, Wed, & Fri. You may take 1 extra tab (20 mg) daily as needed for weight gain/ shortness of breath     No current facility-administered medications on file prior to visit.      Family Hx: The patient's family history includes Arthritis in his maternal grandmother; Breast cancer in his maternal aunt; COPD in his sister; Diabetes in his maternal aunt; Heart attack in his brother, father,  and mother; Hypertension in his maternal aunt and another family member. There is no history of Prostate cancer, Kidney cancer, Bladder Cancer, or Colon cancer.  ROS:   Please see the history of present illness.    Review of Systems  Constitutional: Negative.   Respiratory: Positive for shortness of breath.   Cardiovascular: Negative.   Gastrointestinal: Negative.   Musculoskeletal: Negative.   Neurological: Negative.   Psychiatric/Behavioral: Negative.   All other systems reviewed and are negative.    Labs/Other Tests and Data Reviewed:    Recent Labs: 09/20/2018: ALT 26; B Natriuretic Peptide 149.0; TSH 5.706 09/21/2018: Hemoglobin 14.1; Platelets 209 09/24/2018: BUN 38; Creatinine, Ser 2.03; Potassium 4.3; Sodium 141   Recent Lipid Panel Lab Results  Component Value Date/Time   CHOL 91 12/18/2017 09:15 AM   TRIG 111.0 12/18/2017 09:15 AM   HDL 23.80 (L) 12/18/2017 09:15 AM   CHOLHDL 4 12/18/2017 09:15 AM   LDLCALC 45 12/18/2017 09:15 AM    Wt Readings from Last 3 Encounters:  09/24/18 262 lb 1 oz (118.9 kg)  09/22/18 256 lb 2.8 oz (116.2 kg)  09/16/18 262 lb (118.8 kg)     Exam:    Vital Signs: Vital signs may also be detailed in the HPI There were no vitals taken for this visit.  Wt Readings from Last 3 Encounters:  09/24/18 262 lb 1 oz (118.9 kg)  09/22/18 256 lb 2.8 oz (116.2 kg)  09/16/18 262 lb (118.8 kg)   Temp Readings from Last 3 Encounters:  09/24/18 98.2 F (36.8 C) (Oral)  09/22/18 97.8 F (36.6 C) (Oral)  08/21/18 97.9 F (36.6 C) (Oral)   BP Readings from Last 3 Encounters:  09/24/18 (!) 116/58  09/22/18 (!) 137/55  09/16/18 124/60   Pulse Readings from Last  3 Encounters:  09/24/18 77  09/22/18 (!) 48  09/16/18 (!) 59    120/61 Pulse:51 resp 36  Well nourished, well developed male in no acute distress. Constitutional:  oriented to person, place, and time. No distress.  Head: Normocephalic and atraumatic.  Eyes:  no discharge. No  scleral icterus.  Neck: Normal range of motion. Neck supple.  Pulmonary/Chest: No audible wheezing, no distress, appears comfortable Musculoskeletal: Normal range of motion.  no  tenderness or deformity.  Neurological:   Coordination normal. Full exam not performed Skin:  No rash Psychiatric:  normal mood and affect. behavior is normal. Thought content normal.    ASSESSMENT & PLAN:    Pulmonary hypertension, primary (HCC) Stable breathing on nasal cannula oxygen Recent echocardiogram 2 months ago with moderately elevated right heart pressures Currently alternating torsemide 20 mg with 40 mg creatinine stable around 2 Scheduled to follow-up with pulmonary hypertension clinic in [place]  Morbid obesity ([place]) Unable to exercise very much, strongly recommended low carbohydrate food for weight loss  Chronic diastolic congestive heart failure ([place]) Likely have pushed him as far as we can go with his diuretic in the setting of his renal dysfunction Very inactive at home, reports he is not very symptomatic  Coronary artery disease involving native coronary artery of native heart with angina pectoris ([place]) Currently with no symptoms of angina. No further workup at this time. Continue current medication regimen.  PULMONARY FIBROSIS Followed by pulmonary  Essential hypertension, benign Blood pressure is well controlled on today's visit. No changes made to the medications.   COVID-19 Education: The signs and symptoms of COVID-19 were discussed with the patient and how to seek care for testing (follow up with PCP or arrange E-visit).  The importance of social distancing was discussed today.  Patient Risk:   After full review of this patients clinical status, I feel that they are at least moderate risk at this time.  Time:   Today, I have spent 25 minutes with the patient with telehealth technology discussing the cardiac and medical problems/diagnoses detailed above   10 min spent  reviewing the chart prior to patient visit today   Medication Adjustments/Labs and Tests Ordered: Current medicines are reviewed at length with the patient today.  Concerns regarding medicines are outlined above.   Tests Ordered: No tests ordered   Medication Changes: No changes made   Disposition: Follow-up in 6 months   Signed, Ida Rogue, MD  11/13/2018 4:22 PM    [place] Office [address] [place] [address], [place], [place] [address]

## 2018-11-13 ENCOUNTER — Other Ambulatory Visit: Payer: Self-pay

## 2018-11-13 ENCOUNTER — Telehealth (INDEPENDENT_AMBULATORY_CARE_PROVIDER_SITE_OTHER): Payer: Medicare HMO | Admitting: Cardiovascular Disease

## 2018-11-13 DIAGNOSIS — I5032 Chronic diastolic (congestive) heart failure: Secondary | ICD-10-CM | POA: Diagnosis not present

## 2018-11-13 DIAGNOSIS — I25119 Atherosclerotic heart disease of native coronary artery with unspecified angina pectoris: Secondary | ICD-10-CM | POA: Diagnosis not present

## 2018-11-13 DIAGNOSIS — R0602 Shortness of breath: Secondary | ICD-10-CM | POA: Diagnosis not present

## 2018-11-13 DIAGNOSIS — J8410 Pulmonary fibrosis, unspecified: Secondary | ICD-10-CM | POA: Diagnosis not present

## 2018-11-13 DIAGNOSIS — I270 Primary pulmonary hypertension: Secondary | ICD-10-CM

## 2018-11-13 DIAGNOSIS — I10 Essential (primary) hypertension: Secondary | ICD-10-CM

## 2018-11-13 DIAGNOSIS — I110 Hypertensive heart disease with heart failure: Secondary | ICD-10-CM

## 2018-11-13 MED ORDER — POTASSIUM CHLORIDE CRYS ER 10 MEQ PO TBCR: 10.0000 meq | EXTENDED_RELEASE_TABLET | ORAL | 3 refills | Status: DC

## 2018-11-13 MED ORDER — TORSEMIDE 20 MG PO TABS: 20.0000 mg | ORAL_TABLET | ORAL | 3 refills | Status: DC

## 2018-11-13 NOTE — Patient Instructions (Addendum)
Medication Instructions:  Your physician has recommended you make the following change in your medication:  1. ALTERNATE Torsemide 20 mg 1 tablet (20 mg) daily with 2 tablets (40 mg) daily  2. ALTERNATE Potassium 10 mEq 1 tablet (10 mEq) daily with 2 tablets (20 mEq) daily  If you need a refill on your cardiac medications before your next appointment, please call your pharmacy.    Lab work: No new labs needed   If you have labs (blood work) drawn today and your tests are completely normal, you will receive your results only by: Marland Kitchen MyChart Message (if you have MyChart) OR . A paper copy in the mail If you have any lab test that is abnormal or we need to change your treatment, we will call you to review the results.   Testing/Procedures: No new testing needed   Follow-Up: At [facility], you and your health needs are our priority.  As part of our continuing mission to provide you with exceptional heart care, we have created designated Provider Care Teams.  These Care Teams include your primary Cardiologist (physician) and Advanced Practice Providers (APPs -  Physician Assistants and Nurse Practitioners) who all work together to provide you with the care you need, when you need it.  . You will need a follow up appointment in 12 months .   Please call our office 2 months in advance to schedule this appointment.    . Providers on your designated Care Team:   . Murray Hodgkins, NP . Christell Faith, PA-C . Marrianne Mood, PA-C  Any Other Special Instructions Will Be Listed Below (If Applicable).  For educational health videos Log in to : [email or web address] Or : [email or web address], password : [email or web address]

## 2018-11-20 ENCOUNTER — Other Ambulatory Visit: Payer: Self-pay | Admitting: Cardiovascular Disease

## 2018-11-20 ENCOUNTER — Other Ambulatory Visit: Payer: Self-pay | Admitting: Family Medicine

## 2018-11-20 NOTE — Telephone Encounter (Signed)
Dr.Duncan please advise refill appropriate? Last refill 12/21/17 #30

## 2018-11-21 ENCOUNTER — Other Ambulatory Visit: Payer: Self-pay | Admitting: *Deleted

## 2018-11-21 NOTE — Telephone Encounter (Signed)
Please verify use with patient let me know.  Thanks.

## 2018-11-21 NOTE — Patient Outreach (Signed)
[facility]) Care Management  [place]  11/21/2018   Stephen [name] April 19, 1946 [id number]   Telephone follow up call   Referral received: 3/23 Referral source: [facility] hospital liasion Referral reason : complex care management  Insurance : [employer]     Patient with [facility] hospital admission 3/20-3/22 Dx: Syncope, orthostatic hypotension, diastolic heart failure, Noncompliance with oxygen and CPAP.   Chart reviewed for PMHX; includes but not limited to COPD, Heart failure , sleep apnea , pulmonary fibrosis, Diabetes , Chronic kidney disease stage 3.   Subjective:   Patient discussed that he is doing pretty good, out for a care ride on today.    Encounter Medications:  Outpatient Encounter Medications as of 11/21/2018  Medication Sig  . ACCU-CHEK SOFTCLIX LANCETS lancets Use as instructed to check blood sugar three times daily and as needed.  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  Marland Kitchen acetaminophen (TYLENOL) 325 MG tablet Take 2 tablets (650 mg total) by mouth every 6 (six) hours as needed for mild pain (or Fever >/= 101).  Marland Kitchen albuterol (PROVENTIL HFA;VENTOLIN HFA) 108 (90 Base) MCG/ACT inhaler Inhale 2 puffs into the lungs every 6 (six) hours as needed for wheezing or shortness of breath.  . Alcohol Swabs (B-D SINGLE USE SWABS REGULAR) PADS Use to cleans area prior to checking blood sugar three times daily and as needed.  Diagnosis:  E11.22  Insulin dependent.  Marland Kitchen amLODipine (NORVASC) 5 MG tablet Take 1 tablet (5 mg total) by mouth daily.  Marland Kitchen aspirin 81 MG tablet Take 324 mg by mouth daily.   Marland Kitchen atorvastatin (LIPITOR) 80 MG tablet Take 1 tablet (80 mg total) by mouth at bedtime.  . benzonatate (TESSALON) 100 MG capsule Take 1-2 capsules (100-200 mg total) by mouth 3 (three) times daily as needed.  . Blood Glucose Monitoring Suppl (ACCU-CHEK AVIVA PLUS) w/Device KIT Use to check blood sugar three times daily and as needed.  Diagnosis: E11.22  Insulin-dependent  .  carvedilol (COREG) 12.5 MG tablet Take 0.5 tablets (6.25 mg total) by mouth 2 (two) times daily with a meal.  . cloNIDine (CATAPRES) 0.3 MG tablet Take 1 tablet (0.3 mg total) by mouth 3 (three) times daily.  . clopidogrel (PLAVIX) 75 MG tablet TAKE 1 TABLET EVERY DAY  . diclofenac sodium (VOLTAREN) 1 % GEL Apply 2 g topically 2 (two) times daily as needed (for left knee pain.).  Marland Kitchen diphenhydrAMINE (BENADRYL) 50 MG tablet Take 50 mg by mouth at bedtime.   . docusate sodium (COLACE) 100 MG capsule Take 100 mg by mouth daily as needed for mild constipation.   Marland Kitchen erythromycin ophthalmic ointment Place into the left eye every 6 (six) hours.  Marland Kitchen ezetimibe (ZETIA) 10 MG tablet Take 1 tablet (10 mg total) by mouth daily.  Marland Kitchen gabapentin (NEURONTIN) 800 MG tablet Take 1-1.5 tablets (800-1,200 mg total) by mouth See admin instructions. Take 1 tablet ('800mg'$ ) by mouth every morning and 1 tablets ('1200mg'$ ) every night at bedtime  . glucose blood (ACCU-CHEK AVIVA PLUS) test strip Use as instructed to check blood sugar 3 times daily or as needed.  Diagnosis:  E11.22  Insulin-dependent.  . insulin NPH-regular Human (NOVOLIN 70/30) (70-30) 100 UNIT/ML injection Inject 50-70 Units into the skin See admin instructions. Inject 70u under the skin every morning and 50u under the skin at bedtime  . Insulin Syringe-Needle U-100 (INSULIN SYRINGE 1CC/30GX5/16") 30G X 5/16" 1 ML MISC Use as directed to take insulin Dx 250.62  . isosorbide mononitrate (IMDUR)  60 MG 24 hr tablet Take 1.5 tablets (90 mg total) by mouth 2 (two) times daily.  . nitroGLYCERIN (NITROSTAT) 0.4 MG SL tablet Place 1 tablet (0.4 mg total) under the tongue every 5 (five) minutes as needed. May repeat x3  . omeprazole (PRILOSEC) 20 MG capsule Take 20 mg by mouth daily.    Marland Kitchen oxyCODONE (ROXICODONE) 15 MG immediate release tablet Take 1 tablet (15 mg total) by mouth daily as needed for pain.  . potassium chloride (K-DUR) 10 MEQ tablet Take 1 tablet (10 mEq total)  by mouth daily.  . potassium chloride (K-DUR) 10 MEQ tablet Take 1-2 tablets (10-20 mEq total) by mouth as directed. Alternating every other day. Take 1 tablet (10 mEq) daily and 2 tablets (20 mEq) daily  . sildenafil (REVATIO) 20 MG tablet Take 1 tablet (20 mg total) by mouth 3 (three) times daily.  . tamsulosin (FLOMAX) 0.4 MG CAPS capsule Take 1 capsule (0.4 mg total) by mouth at bedtime.  . torsemide (DEMADEX) 20 MG tablet Take 1-2 tablets (20-40 mg total) by mouth as directed. Alternate every other day. 1 tablet (20 mg) daily and 2 tablets (40 mg) daily   No facility-administered encounter medications on file as of 11/21/2018.     Functional Status:  In your present state of health, do you have any difficulty performing the following activities: 09/25/2018 09/20/2018  Hearing? N N  Vision? Y Y  Comment retinopathy ,scarring of cornea with recent fall  -  Difficulty concentrating or making decisions? N N  Walking or climbing stairs? Y Y  Comment shortness of breath  -  Dressing or bathing? Tempie Donning  Comment wife assist as needed  -  Doing errands, shopping? Y N  Comment wife provides transportation  Facilities manager and eating ? N -  Using the Toilet? N -  In the past six months, have you accidently leaked urine? N -  Do you have problems with loss of bowel control? N -  Managing your Medications? N -  Managing your Finances? N -  Housekeeping or managing your Housekeeping? N -  Comment wife able to complete  -  Some recent data might be hidden    Fall/Depression Screening: Fall Risk  09/25/2018 09/17/2017 02/27/2017  Falls in the past year? 1 Yes Yes  Comment - multiple falls secondary to sepsis; denies injury -  Number falls in past yr: 0 2 or more 1  Injury with Fall? 1 No No  Risk for fall due to : History of fall(s) - -  Follow up Falls prevention discussed - Falls prevention discussed   PHQ 2/9 Scores 09/25/2018 12/21/2017 09/17/2017 02/27/2017 02/02/2017 01/31/2017 08/31/2016  PHQ -  2 Score 0 0 0 0 0 0 0  PHQ- 9 Score - - 0 - - - -    Assessment:   Chronic Diastolic Heart failure  Pulmonary hypertension  Reports weight staying in the 262 to 263 range, patient denies sudden weight gain of 2 pounds in a day or 5 in a week. He reports continuing to take medications as prescribed, specifically speaking on Torsemide dose stating that he is alternating take 2 tablet on one day then 1 tablet next day.  He denies increase in swelling  Patient admits to wearing oxygen at all times , reports breathing about the same and gets winded with mobility in home improves rest with oxygen level staying above 90%. Discussed possibly of attending Pulmonary rehab when they reopen , he  discussed attending heart track in the past and not big on attending a program like that again. Reinforced benefits of exercise, states that he has place in home and outside home to walk as he can tolerate.   Diabetes  Patient report monitoring blood sugar at least once daily, with recent reading 130 and states the range his has been in .  Patient asking about [employer] as recommended by Dr.Benshimon. Discussed with basis of [employer]    Is lower in carbohydrates, higher protein and with good fats .  Patient agreeable to receiving information on diet for diabetes that will provide information on moderation of carbs that will help with blood sugar control and weight loss.   Plan:  Will send EMMI on Diabetes and diet Will send Living with Diabetes book .  Will plan follow up call in the next 3 weeks.   [facility] CM Care Plan Problem One     Most Recent Value  Care Plan Problem One  High risk for readmission related to recent discharge , and readmission for syncope,hypoxia,new diagnosis of pulmonary hypertenison  heart failure   Role Documenting the Problem One  Care Management [place] for Problem One  Active  THN Long Term Goal   Patient would not experience hospital admission over the  next 90 days  [goal reevaluated and date updated ]  [facility]N Long Term Goal Start Date  09/25/18  Interventions for Problem One Long Term Goal  reviewed current clinical symptoms of heart failure , reviewed  importance of daily heart failure measures from handout , taking medications daily, weighing daily, knowing how you feel, limiting salt in foods and how that effects weights . identify being in green zone.    [facility] CM Short Term Goal #1   Over the next 30 days patient will be able to report wearing oxygen when up and CPAP while sleeping .   [facility] CM Short Term Goal #1 Start Date  09/25/18  [facility] CM Short Term Goal #1 Met Date  10/31/18  [facility] CM Short Term Goal #2   Patient will be able to reports monitoring weight on a daily basis and keeping a record.   [facility]N CM Short Term Goal #2 Start Date  09/25/18  [facility] CM Short Term Goal #2 Met Date  10/31/18  [facility] CM Short Term Goal #3  Patient/wife will be able to identify at least 3 symptoms of worsening heart/lung conditon   [facility]N CM Short Term Goal #3 Start Date  10/03/18  [facility] CM Short Term Goal #3 Met Date  11/21/18 [late entry ]  [facility] CM Short Term Goal #4  Patient will be able to identify at least 2 high salt foods to limit in diet over the next 30 days   THN CM Short Term Goal #4 Start Date  10/31/18  [facility] CM Short Term Goal #4 Met Date  11/21/18    [facility] CM Care Plan Problem Two     Most Recent Value  Care Plan Problem Two  Knowledge deficit related to Diabetes control related to diet modifications and self care management for control of diabetes    Role Documenting the Problem Two  Care Management Coordinator  Care Plan for Problem Two  Active [will send emmi ]  Interventions for Problem Two Long Term Goal   Advised patient regarding importance how change in diet  can influence weight, blood sugar control . Review of recent A1c elevation and control level will  send , EMMI on Diabetes and diet and Living with Diabetes book .  THN Long Term Goal  Over the next 30  days patient will be able to identify 2 measures of modication in diet to control blood sugar .   THN Long Term Goal Start Date  11/21/18  [facility] CM Short Term Goal #1   Patient will be able to identify  healthy foods choices  to include in daily meal planning over the next 30 days     [facility]N CM Short Term Goal #1 Start Date  11/21/18  Interventions for Short Term Goal #2   Discussed eating 3 meals a day at regular times helps body better use insulin.  Discussed recommended foods group and plate method, discussed healthy carbohdyrates, whole grains, vegetbales, beans low fat dairy products. Discussed healthy lean proteins to include in diet ,fish, lean beef, chicken baked, grilled, or boiled,  discussed limits       Joylene Draft, RN, [place] Management Coordinator  [phone]- Mobile [phone]- [place] Office

## 2018-11-21 NOTE — Telephone Encounter (Signed)
Im not sure where the last refill date came from in the note below, but the last refill was 10-01-18 #90. It is still a bit too early even for mail order.

## 2018-11-28 DIAGNOSIS — E113213 Type 2 diabetes mellitus with mild nonproliferative diabetic retinopathy with macular edema, bilateral: Secondary | ICD-10-CM | POA: Diagnosis not present

## 2018-11-29 ENCOUNTER — Encounter (INDEPENDENT_AMBULATORY_CARE_PROVIDER_SITE_OTHER): Payer: Medicare HMO

## 2018-11-29 ENCOUNTER — Ambulatory Visit (INDEPENDENT_AMBULATORY_CARE_PROVIDER_SITE_OTHER): Payer: Medicare HMO | Admitting: Vascular Surgery

## 2018-12-19 ENCOUNTER — Ambulatory Visit (INDEPENDENT_AMBULATORY_CARE_PROVIDER_SITE_OTHER): Payer: Medicare HMO

## 2018-12-19 ENCOUNTER — Other Ambulatory Visit: Payer: Self-pay

## 2018-12-19 DIAGNOSIS — E113213 Type 2 diabetes mellitus with mild nonproliferative diabetic retinopathy with macular edema, bilateral: Secondary | ICD-10-CM | POA: Diagnosis not present

## 2018-12-19 DIAGNOSIS — Z0000 Encounter for general adult medical examination without abnormal findings: Secondary | ICD-10-CM

## 2018-12-19 NOTE — Progress Notes (Signed)
No charge. Patient had times confused for appts and is not available for AWV. Rescheduled.

## 2018-12-20 ENCOUNTER — Ambulatory Visit (INDEPENDENT_AMBULATORY_CARE_PROVIDER_SITE_OTHER): Payer: Medicare HMO

## 2018-12-20 DIAGNOSIS — Z0000 Encounter for general adult medical examination without abnormal findings: Secondary | ICD-10-CM | POA: Diagnosis not present

## 2018-12-20 NOTE — Patient Instructions (Signed)
Mr. Fontanilla , Thank you for taking time to come for your Medicare Wellness Visit. I appreciate your ongoing commitment to your health goals. Please review the following plan we discussed and let me know if I can assist you in the future.   These are the goals we discussed: Goals    . Follow up with Primary Care Provider     Starting 12/20/18, I will continue to take medications as prescribed and to keep appointments with PCP as scheduled.        This is a list of the screening recommended for you and due dates:  Health Maintenance  Topic Date Due  . Complete foot exam   09/30/2019*  . Colon Cancer Screening  07/02/2020*  . Tetanus Vaccine  07/02/2020*  . Hemoglobin A1C  12/21/2018  . Flu Shot  02/01/2019  . Eye exam for diabetics  02/28/2019  .  Hepatitis C: One time screening is recommended by Center for Disease Control  (CDC) for  adults born from [birth year] through 1963.   Completed  . Pneumonia vaccines  Completed  *Topic was postponed. The date shown is not the original due date.   Preventive Care for Adults  A healthy lifestyle and preventive care can promote health and wellness. Preventive health guidelines for adults include the following key practices.  . A routine yearly physical is a good way to check with your health care provider about your health and preventive screening. It is a chance to share any concerns and updates on your health and to receive a thorough exam.  . Visit your dentist for a routine exam and preventive care every 6 months. Brush your teeth twice a day and floss once a day. Good oral hygiene prevents tooth decay and gum disease.  . The frequency of eye exams is based on your age, health, family medical history, use  of contact lenses, and other factors. Follow your health care provider's recommendations for frequency of eye exams.  . Eat a healthy diet. Foods like vegetables, fruits, whole grains, low-fat dairy products, and lean protein foods contain the  nutrients you need without too many calories. Decrease your intake of foods high in solid fats, added sugars, and salt. Eat the right amount of calories for you. Get information about a proper diet from your health care provider, if necessary.  . Regular physical exercise is one of the most important things you can do for your health. Most adults should get at least 150 minutes of moderate-intensity exercise (any activity that increases your heart rate and causes you to sweat) each week. In addition, most adults need muscle-strengthening exercises on 2 or more days a week.  Silver Sneakers may be a benefit available to you. To determine eligibility, you may visit the website: www.silversneakers.com or contact program at (207)779-7795 Mon-Fri between 8AM-8PM.   . Maintain a healthy weight. The body mass index (BMI) is a screening tool to identify possible weight problems. It provides an estimate of body fat based on height and weight. Your health care provider can find your BMI and can help you achieve or maintain a healthy weight.   For adults 20 years and older: ? A BMI below 18.5 is considered underweight. ? A BMI of 18.5 to 24.9 is normal. ? A BMI of 25 to 29.9 is considered overweight. ? A BMI of 30 and above is considered obese.   . Maintain normal blood lipids and cholesterol levels by exercising and minimizing your intake of saturated  fat. Eat a balanced diet with plenty of fruit and vegetables. Blood tests for lipids and cholesterol should begin at age 58 and be repeated every 5 years. If your lipid or cholesterol levels are high, you are over 50, or you are at high risk for heart disease, you may need your cholesterol levels checked more frequently. Ongoing high lipid and cholesterol levels should be treated with medicines if diet and exercise are not working.  . If you smoke, find out from your health care provider how to quit. If you do not use tobacco, please do not start.  . If you  choose to drink alcohol, please do not consume more than 2 drinks per day. One drink is considered to be 12 ounces (355 mL) of beer, 5 ounces (148 mL) of wine, or 1.5 ounces (44 mL) of liquor.  . If you are 75-48 years old, ask your health care provider if you should take aspirin to prevent strokes.  . Use sunscreen. Apply sunscreen liberally and repeatedly throughout the day. You should seek shade when your shadow is shorter than you. Protect yourself by wearing long sleeves, pants, a wide-brimmed hat, and sunglasses year round, whenever you are outdoors.  . Once a month, do a whole body skin exam, using a mirror to look at the skin on your back. Tell your health care provider of new moles, moles that have irregular borders, moles that are larger than a pencil eraser, or moles that have changed in shape or color.

## 2018-12-23 ENCOUNTER — Telehealth: Payer: Self-pay

## 2018-12-23 ENCOUNTER — Other Ambulatory Visit (INDEPENDENT_AMBULATORY_CARE_PROVIDER_SITE_OTHER): Payer: Medicare HMO

## 2018-12-23 ENCOUNTER — Other Ambulatory Visit: Payer: Self-pay | Admitting: Family Medicine

## 2018-12-23 DIAGNOSIS — Z794 Long term (current) use of insulin: Secondary | ICD-10-CM

## 2018-12-23 DIAGNOSIS — N183 Chronic kidney disease, stage 3 unspecified: Secondary | ICD-10-CM

## 2018-12-23 DIAGNOSIS — E1122 Type 2 diabetes mellitus with diabetic chronic kidney disease: Secondary | ICD-10-CM | POA: Diagnosis not present

## 2018-12-23 LAB — COMPREHENSIVE METABOLIC PANEL
ALT: 13 U/L (ref 0–53)
AST: 13 U/L (ref 0–37)
Albumin: 3.1 g/dL — ABNORMAL LOW (ref 3.5–5.2)
Alkaline Phosphatase: 86 U/L (ref 39–117)
BUN: 25 mg/dL — ABNORMAL HIGH (ref 6–23)
CO2: 27 mEq/L (ref 19–32)
Calcium: 8.4 mg/dL (ref 8.4–10.5)
Chloride: 108 mEq/L (ref 96–112)
Creatinine, Ser: 1.82 mg/dL — ABNORMAL HIGH (ref 0.40–1.50)
GFR: 36.92 mL/min — ABNORMAL LOW (ref >60.00)
Glucose, Bld: 93 mg/dL (ref 70–99)
Potassium: 4.2 mEq/L (ref 3.5–5.1)
Sodium: 141 mEq/L (ref 135–145)
Total Bilirubin: 0.5 mg/dL (ref 0.2–1.2)
Total Protein: 5.7 g/dL — ABNORMAL LOW (ref 6.0–8.3)

## 2018-12-23 LAB — LIPID PANEL
Cholesterol: 125 mg/dL (ref 0–200)
HDL: 27.7 mg/dL — ABNORMAL LOW (ref >39.00)
LDL Cholesterol: 69 mg/dL (ref 0–99)
NonHDL: 97.28
Total CHOL/HDL Ratio: 5
Triglycerides: 142 mg/dL (ref 0.0–149.0)
VLDL: 28.4 mg/dL (ref 0.0–40.0)

## 2018-12-23 LAB — TSH: TSH: 9.41 u[IU]/mL — ABNORMAL HIGH (ref 0.35–4.50)

## 2018-12-23 LAB — HEMOGLOBIN A1C: Hgb A1c MFr Bld: 7.7 % — ABNORMAL HIGH (ref 4.6–6.5)

## 2018-12-23 NOTE — Progress Notes (Signed)
Subjective:   Stephen [name] is a 73 y.o. male who presents for Medicare Annual/Subsequent preventive examination.  Review of Systems:  N/A Cardiac Risk Factors include: advanced age (>66mn, >>32women);diabetes mellitus;dyslipidemia;male gender;obesity (BMI >30kg/m2)     Objective:    Vitals: There were no vitals taken for this visit.  There is no height or weight on file to calculate BMI.  Advanced Directives 12/20/2018 11/21/2018 09/20/2018 09/20/2018 08/14/2018 08/14/2018 08/05/2018  Does Patient Have a Medical Advance Directive? _0  No No  Would patient like information on creating a medical advance directive? No - Patient declined (No Data) No - Patient declined - No - Patient declined - No - Patient declined    Tobacco Social History   Tobacco Use  Smoking Status Never Smoker  Smokeless Tobacco Never Used     Counseling given: Not Answered   Clinical Intake:  Pre-visit preparation completed: Yes  Pain : No/denies pain Pain Score: 0-No pain     Nutritional Status: BMI > 30  Obese Nutritional Risks: None  How often do you need to have someone help you when you read instructions, pamphlets, or other written materials from your doctor or pharmacy?: 1 - Never What is the last grade level you completed in school?: 12th grade  Interpreter Needed?: No  Comments: pr lives with spouse Information entered by :: LPinson, RN  Past Medical History:  Diagnosis Date  . Adrenal gland anomaly   . Arthritis   . CAD (coronary artery disease)    a. 04/2006 MI and PCI/stenting to mLCx & mRCA; b. 07/2009 Cath: patent LCX/RCA stents;  c. 12/2016 NSTEMI/Cath: LM 30, LAD 30p, 553m30d, D1/2/3 min irregs, LCX 10050mR, OM1 min irregs, RCA 20p, 10 ISR, 6m41md, RPDA min irregs, RPAV 40-->Med Rx; d. 12/2017 MV: fixed lateral wall scar, mild anterior/anterior septal ischemia. EF 30-44%.  . CKD (chronic kidney disease), stage III (HCC)[place]. Colon polyps   . DM2 (diabetes  mellitus, type 2) (HCC)    insulin requiring  . Dyslipidemia   . GERD (gastroesophageal reflux disease)   . History of echocardiogram    a. TTE 12/2016: EF 50-55%, mod concentric LVH, images inadequate for wall motion assessment, not technically sufficienct to allow for LV dias fxn, calcified mitral annulus   . HTN (hypertension)   . Kidney stones   . Left Renal artery stenosis (HCC)[place] a. 07/2013 s/p PTA/stenting ([place]); b. 07/2017 Renal Duplex: No significant RAS.  . MyMarland Kitchencardial infarction (HCC)[place]. OSA (obstructive sleep apnea)   . Pulmonary fibrosis (HCC)[place] a. 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  . Recurrent UTI   . Rotator cuff disorder    right  . Skin cancer    head  . Thyroid disorder   . Venous insufficiency of both lower extremities    a. s/p laser treatment.   Past Surgical History:  Procedure Laterality Date  . adrenal adenoma removal  1990-   right  . CARPAL TUNNEL RELEASE     bilateral  . CATARACT EXTRACTION W/PHACO Right 07/08/2018   Procedure: CATARACT EXTRACTION PHACO AND INTRAOCULAR LENS PLACEMENT (IOC) RIGHT DIABETIC;  Surgeon: KingEulogio Bear;  [facility];  Laterality: Right;  Diabetic - insulin sleep apnea  . CATARACT EXTRACTION W/PHACO Left 08/05/2018   Procedure: CATARACT EXTRACTION PHACO AND INTRAOCULAR LENS PLACEMENT ([facility] [facility];  Surgeon:  Eulogio Bear, MD;  Location: [place];  Service: Ophthalmology;  Laterality: Left;  DIABETIC  . CORONARY ANGIOPLASTY WITH STENT PLACEMENT  2007   x 2  . KNEE SURGERY     right  . LEFT HEART CATH AND CORONARY ANGIOGRAPHY N/A 01/11/2017   Procedure: Left Heart Cath and Coronary Angiography;  Surgeon: Wellington Hampshire, MD;  Location: [facility];  Service: Cardiovascular;  Laterality: N/A;  . LUNG BIOPSY    . RENAL ARTERY STENT  2015   L  . SKIN CANCER EXCISION     back of head   Family  History  Problem Relation Age of Onset  . Heart attack Father   . Heart attack Mother   . Heart attack Brother   . COPD Sister   . Arthritis Maternal Grandmother   . Breast cancer Maternal Aunt   . Diabetes Maternal Aunt   . Hypertension Other   . Hypertension Maternal Aunt   . Prostate cancer Neg Hx   . Kidney cancer Neg Hx   . Bladder Cancer Neg Hx   . Colon cancer Neg Hx    Social History   Socioeconomic History  . Marital status: Married    Spouse name: Not on file  . Number of children: 2  . Years of education: Not on file  . Highest education level: Not on file  Occupational History  . 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 Financial resource strain: Not on file  . Food insecurity    Worry: Not on file    Inability: Not on file  . Transportation needs    Medical: Not on file    Non-medical: Not on file  Tobacco Use  . Smoking status: Never Smoker  . Smokeless tobacco: Never Used  Substance and Sexual Activity  . Alcohol use: No    Alcohol/week: 0.0 standard drinks  . Drug use: No  . Sexual activity: Yes  Lifestyle  . Physical activity    Days per week: Not on file    Minutes per session: Not on file  . Stress: Not on file  Relationships  . Social Herbalist on phone: Not on file    Gets together: Not on file    Attends religious service: Not on file    Active member of club or organization: Not on file    Attends meetings of clubs or organizations: Not on file    Relationship status: Not on file  Other Topics Concern  . Not on file  Social History Narrative   Enjoys fishing   Y[employer] fan    Outpatient Encounter Medications as of 12/20/2018  Medication Sig  . ACCU-CHEK SOFTCLIX LANCETS lancets Use as instructed to check blood sugar three times daily and as needed.  Diagnosis: E11.22   Insulin-dependent.  Marland Kitchen acetaminophen (TYLENOL) 325 MG tablet Take 2 tablets (650 mg total) by mouth every 6 (six) hours as needed for mild pain (or Fever >/= 101).  Marland Kitchen albuterol (PROVENTIL HFA;VENTOLIN HFA) 108 (90 Base) MCG/ACT inhaler Inhale 2 puffs into the lungs every 6 (six) hours as needed for wheezing or shortness of breath.  . Alcohol Swabs (B-D SINGLE USE SWABS REGULAR) PADS Use to cleans area prior to checking blood sugar three times daily and as needed.  Diagnosis:  E11.22  Insulin dependent.  Marland Kitchen  amLODipine (NORVASC) 5 MG tablet Take 1 tablet (5 mg total) by mouth daily.  Marland Kitchen aspirin 81 MG tablet Take 324 mg by mouth daily.   Marland Kitchen atorvastatin (LIPITOR) 80 MG tablet Take 1 tablet (80 mg total) by mouth at bedtime.  . benzonatate (TESSALON) 100 MG capsule Take 1-2 capsules (100-200 mg total) by mouth 3 (three) times daily as needed.  . Blood Glucose Monitoring Suppl (ACCU-CHEK AVIVA PLUS) w/Device KIT Use to check blood sugar three times daily and as needed.  Diagnosis: E11.22  Insulin-dependent  . carvedilol (COREG) 12.5 MG tablet Take 0.5 tablets (6.25 mg total) by mouth 2 (two) times daily with a meal.  . cloNIDine (CATAPRES) 0.3 MG tablet Take 1 tablet (0.3 mg total) by mouth 3 (three) times daily.  . clopidogrel (PLAVIX) 75 MG tablet TAKE 1 TABLET EVERY DAY  . diclofenac sodium (VOLTAREN) 1 % GEL Apply 2 g topically 2 (two) times daily as needed (for left knee pain.).  Marland Kitchen diphenhydrAMINE (BENADRYL) 50 MG tablet Take 50 mg by mouth at bedtime.   . docusate sodium (COLACE) 100 MG capsule Take 100 mg by mouth daily as needed for mild constipation.   Marland Kitchen erythromycin ophthalmic ointment Place into the left eye every 6 (six) hours.  . gabapentin (NEURONTIN) 800 MG tablet Take 1-1.5 tablets (800-1,200 mg total) by mouth See admin instructions. Take 1 tablet (862m) by mouth every morning and 1 tablets (12052m every night at bedtime  . glucose blood (ACCU-CHEK AVIVA PLUS) test strip Use as instructed  to check blood sugar 3 times daily or as needed.  Diagnosis:  E11.22  Insulin-dependent.  . insulin NPH-regular Human (NOVOLIN 70/30) (70-30) 100 UNIT/ML injection Inject 50-70 Units into the skin See admin instructions. Inject 70u under the skin every morning and 50u under the skin at bedtime  . Insulin Syringe-Needle U-100 (INSULIN SYRINGE 1CC/30GX5/16") 30G X 5/16" 1 ML MISC Use as directed to take insulin Dx 250.62  . isosorbide mononitrate (IMDUR) 60 MG 24 hr tablet Take 1.5 tablets (90 mg total) by mouth 2 (two) times daily.  . nitroGLYCERIN (NITROSTAT) 0.4 MG SL tablet Place 1 tablet (0.4 mg total) under the tongue every 5 (five) minutes as needed. May repeat x3  . omeprazole (PRILOSEC) 20 MG capsule Take 20 mg by mouth daily.    . Marland KitchenxyCODONE (ROXICODONE) 15 MG immediate release tablet Take 1 tablet (15 mg total) by mouth daily as needed for pain.  . potassium chloride (K-DUR) 10 MEQ tablet Take 1-2 tablets (10-20 mEq total) by mouth as directed. Alternating every other day. Take 1 tablet (10 mEq) daily and 2 tablets (20 mEq) daily  . sildenafil (REVATIO) 20 MG tablet Take 1 tablet (20 mg total) by mouth 3 (three) times daily.  . tamsulosin (FLOMAX) 0.4 MG CAPS capsule Take 1 capsule (0.4 mg total) by mouth at bedtime.  . torsemide (DEMADEX) 20 MG tablet Take 1-2 tablets (20-40 mg total) by mouth as directed. Alternate every other day. 1 tablet (20 mg) daily and 2 tablets (40 mg) daily  . ezetimibe (ZETIA) 10 MG tablet Take 1 tablet (10 mg total) by mouth daily.  . potassium chloride (K-DUR) 10 MEQ tablet Take 1 tablet (10 mEq total) by mouth daily.   No facility-administered encounter medications on file as of 12/20/2018.     Activities of Daily Living In your present state of health, do you have any difficulty performing the following activities: 12/20/2018 09/25/2018  Hearing? N N  Vision? Y Tempie Donning  Comment - retinopathy ,scarring of cornea with recent fall   Difficulty concentrating or making  decisions? N N  Walking or climbing stairs? Y Y  Comment - shortness of breath   Dressing or bathing? N Y  Comment - wife assist as needed   Doing errands, shopping? Y Y  Comment - wife provides Agricultural engineer and eating ? N N  Using the Toilet? N N  In the past six months, have you accidently leaked urine? N N  Do you have problems with loss of bowel control? N N  Managing your Medications? N N  Managing your Finances? N N  Housekeeping or managing your Housekeeping? N N  Comment - wife able to complete   Some recent data might be hidden    Patient Care Team: [name], MD as PCP - General (Family Medicine) Rockey Situ Kathlene November, MD as PCP - Cardiology (Cardiology) Rockey Situ, Kathlene November, MD as Consulting Physician (Cardiology) Alisa Graff, FNP as Nurse Practitioner (Family Medicine) Alfonzo Feller, RN as [facility] Management   Assessment:   This is a routine wellness examination for Stephen [name].   Hearing Screening   125Hz 250Hz 500Hz 1000Hz 2000Hz 3000Hz 4000Hz 6000Hz 8000Hz  Right ear:           Left ear:           Vision Screening Comments: Frequent vision screens during injections every 6 wks due to diabetic retinopathy/Dr. Edison Pace   Exercise Activities and Dietary recommendations Current Exercise Habits: The patient does not participate in regular exercise at present, Exercise limited by: None identified  Goals    . Follow up with Primary Care Provider     Starting 12/20/18, I will continue to take medications as prescribed and to keep appointments with PCP as scheduled.        Fall Risk Fall Risk  12/20/2018 09/25/2018 09/17/2017 02/27/2017 02/02/2017  Falls in the past year? 1 1 Yes Yes Yes  Comment lost balance and fell through wall; subsequent hospitalization - multiple falls secondary to sepsis; denies injury - -  Number falls in past yr: 0 0 2 or more 1 1  Injury with Fall? 1 1 No No No  Risk for fall due to : History of  fall(s);Impaired balance/gait;Impaired mobility History of fall(s) - - -  Follow up - Falls prevention discussed - Falls prevention discussed -   Depression Screen PHQ 2/9 Scores 12/20/2018 09/25/2018 12/21/2017 09/17/2017  PHQ - 2 Score 0 0 0 0  PHQ- 9 Score 0 - - 0    Cognitive Function MMSE - Mini Mental State Exam 12/20/2018 09/17/2017  Orientation to time 5 5  Orientation to Place 5 5  Registration 3 3  Attention/ Calculation 0 0  Recall 3 3  Language- name 2 objects 0 0  Language- repeat 1 1  Language- follow 3 step command 0 3  Language- read & follow direction 0 0  Write a sentence 0 0  Copy design 0 0  Total score 17 20     PLEASE NOTE: A Mini-Cog screen was completed. Maximum score is 17. A value of 0 denotes this part of [place]stein MMSE was not completed or the patient failed this part of the Mini-Cog screening.   Mini-Cog Screening Orientation to Time - Max 5 pts Orientation to Place - Max 5 pts Registration - Max 3 pts Recall - Max 3 pts Language Repeat - Max 1 pts  Immunization History  Administered Date(s) Administered  . Influenza Split 04/25/2011, 05/17/2012  . Influenza,inj,Quad PF,6+ Mos 04/22/2013, 04/03/2014, 03/02/2015, 03/02/2016, 03/12/2017, 03/25/2018  . Pneumococcal Conjugate-13 10/26/2014  . Pneumococcal Polysaccharide-23 06/01/2009, 09/15/2013   Screening Tests Health Maintenance  Topic Date Due  . HEMOGLOBIN A1C  12/21/2018  . FOOT EXAM  09/30/2019 (Originally 09/21/2018)  . COLONOSCOPY  07/02/2020 (Originally 09/03/2014)  . TETANUS/TDAP  07/02/2020 (Originally [birth year]/1967)  . INFLUENZA VACCINE  02/01/2019  . OPHTHALMOLOGY EXAM  02/28/2019  . Hepatitis C Screening  Completed  . PNA vac Low Risk Adult  Completed        Plan:     I have personally reviewed, addressed, and noted the following in the patient's chart:  A. Medical and social history B. Use of alcohol, tobacco or illicit drugs  C. Current medications and supplements D.  Functional ability and status E.  Nutritional status F.  Physical activity G. Advance directives H. List of other physicians I.  Hospitalizations, surgeries, and ER visits in previous 12 months J.  Vitals (unless it is a telemedicine encounter) K. Screenings to include cognitive, depression, hearing, vision (NOTE: hearing and vision screenings not completed in telemedicine encounter) L. Referrals and appointments   In addition, I have reviewed and discussed with patient certain preventive protocols, quality metrics, and best practice recommendations. A written personalized care plan for preventive services and recommendations were provided to patient.  With patient's permission, we connected on 12/20/18 at  8:30 AM EDT. Interactive audio and video telecommunications were attempted with patient. This attempt was unsuccessful due to patient having technical difficulties OR patient did not have access to video capability.  Encounter was completed with audio only.  Two patient identifiers were used to ensure the encounter occurred with the correct person. Patient was in home and writer was in office.     Signed,   Lindell Noe, MHA, BS, RN Health Coach

## 2018-12-23 NOTE — Progress Notes (Signed)
PCP notes:   Health maintenance:  A1C - ordered  Abnormal screenings:   Fall risk - hx of single fall Fall Risk  12/20/2018 09/25/2018 09/17/2017 02/27/2017 02/02/2017  Falls in the past year? 1 1 Yes Yes Yes  Comment lost balance and fell through wall; subsequent hospitalization - multiple falls secondary to sepsis; denies injury - -  Number falls in past yr: 0 0 2 or more 1 1  Injury with Fall? 1 1 No No No  Risk for fall due to : History of fall(s);Impaired balance/gait;Impaired mobility History of fall(s) - - -  Follow up - Falls prevention discussed - Falls prevention discussed -    Patient concerns:   None  Nurse concerns:  None  Next PCP appt:   12/24/18 @ 1045  I reviewed health advisor's note, was available for consultation on the day of service listed in this note, and agree with documentation and plan. Elsie Stain, MD.

## 2018-12-23 NOTE — Telephone Encounter (Addendum)
Pt said he is feeling pretty good. Pt is getting ready to eat lunch and is checking BS now. BS was 187. I spoke with Mrs Portman as well and advised Dr [name] is OK with pt never doing fasting labs here again. Mrs. Haverstock voiced understanding. FYI to Dr [name].

## 2018-12-23 NOTE — Telephone Encounter (Signed)
Noted. Thanks.  Please check on him this afternoon, just to make sure he is doing okay.   I am okay with him never doing fasting labs here again.

## 2018-12-23 NOTE — Telephone Encounter (Signed)
[place] Night - Client TELEPHONE ADVICE RECORD [facility] Patient Name: Stephen [name] Gender: Male DOB: 04/27/1946 Age: 73 Y 77 M 10 D Return Phone Number: [phone] (Primary) Address[place] [address] Client [place] Night - Client Client Site [place] Physician Renford Dills - MD Contact Type Call Who Is Calling Patient / Member / Family / Caregiver Call Type Triage / Clinical Caller Name Vinicius Brockman Relationship To Patient Spouse Return Phone Number [phone] (Primary) Chief Complaint Blood Sugar Low Reason for Call Symptomatic / Request for [place] states her husband was supposed to get blood work done so he hasn't been eating anything. His Blood Sugar is now 66 and he's starting to have visual changes. He is seeing big circles. Translation No Nurse Assessment Nurse: Wynetta Emery, RN, Baker Janus Date/Time Eilene Ghazi Time): 12/23/2018 8:04:12 AM Confirm and document reason for call. If symptomatic, describe symptoms. ---Koby is to have lab work - and it is at 12 and now seeing circles in his vision and blood sugar is 66 at this time. was 48 Has the patient had close contact with a person known or suspected to have the novel coronavirus illness OR traveled / lives in area with major community spread (including international travel) in the last 14 days from the onset of symptoms? * If Asymptomatic, screen for exposure and travel within the last 14 days. ---No Does the patient have any new or worsening symptoms? ---Yes Will a triage be completed? ---Yes Related visit to physician within the last 2 weeks? ---No Does the PT have any chronic conditions? (i.e. diabetes, asthma, this includes High risk factors for pregnancy, etc.) ---Yes List chronic conditions. ---Heart d/o COPD on oxygen HTN --pulmonary HTN Diabetes Is this a behavioral health  or substance abuse call? ---No Guidelines Guideline Title Affirmed Question Affirmed Notes Nurse Date/Time (Eastern Time) Diabetes - Low Blood Sugar [1] Caller has URGENT medication or insulin pump question AND [2] triager unable to answer question Ivin Booty 12/23/2018 8:07:07 AM PLEASE NOTE: All timestamps contained within this report are represented as [place] Standard Time. CONFIDENTIALTY NOTICE: This fax transmission is intended only for the addressee. It contains information that is legally privileged, confidential or otherwise protected from use or disclosure. If you are not the intended recipient, you are strictly prohibited from reviewing, disclosing, copying using or disseminating any of this information or taking any action in reliance on or regarding this information. If you have received this fax in error, please notify us immediately by telephone so that we can arrange for its return to [place]. Phone: [phone], Toll-Free: [phone], Fax: [phone] Page: 2 of 2 Call Id: [id number] [place]. Time Eilene Ghazi Time) Disposition Final User 12/23/2018 8:08:56 AM Call PCP Now Yes Wynetta Emery, RN, Christin Bach Disagree/Comply Comply Caller Understands Yes PreDisposition Call Doctor Care Advice Given Per Guideline * You need to discuss this with your doctor (or NP/PA). * If you have a diabetes specialist (doctor, NP, PA), then you should call the specialist now. * You have more questions. * You become worse. CARE ADVICE given per Diabetes - Low Blood Sugar (Adult) guideline. Comments User: Michele Rockers, RN Date/Time Eilene Ghazi Time): 12/23/2018 8:20:14 AM NOTE advised to drink another 1/2 glass of milk since blood sugar is low and having visual s/sx. and elling he is going to pass out. Office notified and got permission to have them come in now to do the blood work. [name]  notified to drive around to back door. and come now. take crackers or something he can eat after his labs are  drawn. Referrals GO TO FACILITY UNDECIDED

## 2018-12-23 NOTE — Telephone Encounter (Signed)
I spoke with Stephen [name] in lab and pt was shaky while in office; pt was given apple juice and then left office.  I spoke with pt; pt said he has eaten and is feeling better. FYI to Dr [name].

## 2018-12-24 ENCOUNTER — Ambulatory Visit (INDEPENDENT_AMBULATORY_CARE_PROVIDER_SITE_OTHER): Payer: Medicare HMO | Admitting: Family Medicine

## 2018-12-24 ENCOUNTER — Encounter: Payer: Self-pay | Admitting: Family Medicine

## 2018-12-24 DIAGNOSIS — E1122 Type 2 diabetes mellitus with diabetic chronic kidney disease: Secondary | ICD-10-CM | POA: Diagnosis not present

## 2018-12-24 DIAGNOSIS — J8410 Pulmonary fibrosis, unspecified: Secondary | ICD-10-CM | POA: Diagnosis not present

## 2018-12-24 DIAGNOSIS — G8929 Other chronic pain: Secondary | ICD-10-CM

## 2018-12-24 DIAGNOSIS — Z794 Long term (current) use of insulin: Secondary | ICD-10-CM

## 2018-12-24 DIAGNOSIS — E785 Hyperlipidemia, unspecified: Secondary | ICD-10-CM

## 2018-12-24 DIAGNOSIS — Z7189 Other specified counseling: Secondary | ICD-10-CM

## 2018-12-24 DIAGNOSIS — E039 Hypothyroidism, unspecified: Secondary | ICD-10-CM | POA: Diagnosis not present

## 2018-12-24 DIAGNOSIS — I10 Essential (primary) hypertension: Secondary | ICD-10-CM | POA: Diagnosis not present

## 2018-12-24 DIAGNOSIS — N183 Chronic kidney disease, stage 3 (moderate): Secondary | ICD-10-CM | POA: Diagnosis not present

## 2018-12-24 MED ORDER — LEVOTHYROXINE SODIUM 25 MCG PO TABS: 25.0000 ug | ORAL_TABLET | Freq: Every day | ORAL | 3 refills | Status: DC

## 2018-12-24 MED ORDER — ALBUTEROL SULFATE HFA 108 (90 BASE) MCG/ACT IN AERS: 2.0000 | INHALATION_SPRAY | Freq: Four times a day (QID) | RESPIRATORY_TRACT | 3 refills | Status: DC | PRN

## 2018-12-24 MED ORDER — CARVEDILOL 6.25 MG PO TABS: 6.2500 mg | ORAL_TABLET | Freq: Two times a day (BID) | ORAL | 3 refills | Status: DC

## 2018-12-24 MED ORDER — GABAPENTIN 400 MG PO CAPS: ORAL_CAPSULE | ORAL | 3 refills | Status: DC

## 2018-12-24 NOTE — Progress Notes (Signed)
Virtual visit completed through WebEx or similar program Patient location: home  Provider location: Financial controller at [facility], office   Pandemic considerations d/w pt.   Limitations and rationale for visit method d/w patient.  Patient agreed to proceed.   CC: f/u.   HPI:  Colon and PSA screening deferred.   PSA up to date Flu yearly Defer Tdap and shingles for now.   Living will d/w pt.  Wife designated.   Hypothyroidism.  He doesn't feel a lump in his neck.  He is noticed some occasional dysphasia.  He will update me if that is worse in the meantime. Defer direct imaging re: dysphagia for now given pandemic.  We talked about avoiding fasting labs with patient.    Fall cautions d/w pt.  He is still having shoulder pain.  He had lower back pain at baseline.  He has used oxycodone prn w/o ADE on med. D/w pt.  He didn't knee refill on oxycodone at this point. He is putting up with pain and deferred PT at this point, since he was making some improvement.    Lung disease, still on O2 at baseline.  Compliant.  98% on 5L at baseline.  No sputum. Not much cough usually.  Some wheeze, d/w pt, cautions re: heat and humidity.   He needed refills on inhalers.    He had trouble swallowing large tabs.  No vomiting.  No blood in stool.    Hypertension:    Using medication without problems or lightheadedness: yes Chest pain with exertion:not unless his O2 cord malfunctions, d/w pt.   Edema: more as the day goes on.   Short of breath: at baseline, not worse.    Elevated Cholesterol: Using medications without problems: yes Muscle aches: not from statin, d/w pt.  Diet compliance: encouraged.  Exercise: limited   Diabetes:  Using medications without difficulties: yes Hypoglycemic episodes: see prev notes, cautions d/w pt.   Hyperglycemic episodes:no Feet problems:no Blood Sugars averaging: 70 this AM.  Max 200 right after eating Labs d/w pt.   pmh sh fh reviewed.   Meds and allergies  reviewed.   ROS: Per HPI unless specifically indicated in ROS section   NAD Speech wnl  A/P:  Health maintenance Colon and PSA screening deferred.   PSA up to date Flu yearly Defer Tdap and shingles for now.   Living will d/w pt.  Wife designated if patient were incapacitated.  Hypothyroidism.  He doesn't feel a lump in his neck.  He is noticed some occasional dysphasia.  He had trouble swallowing large tabs.  No vomiting.  No blood in stool.  He will update me if that is worse in the meantime. Defer direct imaging re: dysphagia for now given pandemic.  TSH mildly elevated.  Will start levothyroxine 25 mcg a day and recheck TSH with next A1c in about 3 months.  Fall cautions d/w pt.  He is still having shoulder pain.  He had lower back pain at baseline.  He has used oxycodone prn w/o ADE on med. D/w pt.  He didn't knee refill on oxycodone at this point. He is putting up with pain and deferred PT at this point, since he was making some improvement.    Lung disease, still on O2 at baseline.  Compliant.  98% on 5L at baseline.  No sputum. Not much cough usually.  Some wheeze, d/w pt, cautions re: heat and humidity.   He needed refills on inhalers.  He will update me  as needed.  Hypertension:    No change in meds at this point.  Labs discussed with patient.  Elevated Cholesterol: No change in meds at this point.  Labs discussed with patient.  Diabetes:  Labs d/w pt. A1c improved from before, reasonable with A1c between 7 and 8. We talked about avoiding fasting labs with patient.   Recheck in a few months.  No change in meds at this point.  He agrees.

## 2018-12-24 NOTE — Telephone Encounter (Signed)
Noted. Thanks.

## 2018-12-25 ENCOUNTER — Other Ambulatory Visit: Payer: Self-pay | Admitting: *Deleted

## 2018-12-25 NOTE — Patient Outreach (Signed)
Stephen [name]) Care Management  [place]  12/25/2018   Stephen [name] 01-24-1946 [id number]   Referral source: [name] liasion Referral reason : complex care management  Insurance : [employer]     Patient with [name] admission 3/20-3/22 Dx: Syncope, orthostatic hypotension, diastolic heart failure, Noncompliance with oxygen and CPAP.   Chart reviewed for PMHX; includes but not limited to COPD, Heart failure , sleep apnea , pulmonary fibrosis, Diabetes , Chronic kidney disease stage 3.   Subjective:  Successful outreach call to patient , he discussed feeling pretty good on today and staying in.  Patient discussed recent visit to PCP  Diabetes He reports continuing to monitor cbg readings , am reading usually 90 to evening reading 180 to 200. Patient discussed recent low blood sugar , down to 48 after fasting for morning labs and states that he did not eat a usual snack at bedtime night before. Patient reports blood sugar this am was 80, he discussed receiving diabetes diet information and his wife has been reviewing and making meals .  Patient recent A1c was 7.7 on 6/22. Patient discussed having monthly eye injections at [place] office , 2 weeks ago he had right done and left eye planned in next month.  Diastolic heart failure/pulmonary hypertension/Pulmonary Fibrosis  Patient continues to weigh daily, weight today is 261 reports being down 4 pounds. Patient continues to wear, his oxygen 24 hours a day and wearing cpap at night. He discussed his oxygen levels range 95 to 97%. He reports swelling in lower legs about the same less in the morning. He denies any increase in worsening of symptoms of heart failure as reviewed no sudden weight gain, cough , sputum production.  Patient discussed using albuterol prn as needed for wheezing, he keeps with him at all times and avoids being out in the heat .  Discussed pulmonary rehab at mentioned by cardiology as  a plan when available, patient at this time he declines interest, benefits of program reviewed     Encounter Medications:  Outpatient Encounter Medications as of 12/25/2018  Medication Sig  . ACCU-CHEK SOFTCLIX LANCETS lancets Use as instructed to check blood sugar three times daily and as needed.  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  Marland Kitchen acetaminophen (TYLENOL) 325 MG tablet Take 2 tablets (650 mg total) by mouth every 6 (six) hours as needed for mild pain (or Fever >/= 101).  Marland Kitchen albuterol (VENTOLIN HFA) 108 (90 Base) MCG/ACT inhaler Inhale 2 puffs into the lungs every 6 (six) hours as needed for wheezing or shortness of breath. Dispense 3 inhalers.  . Alcohol Swabs (B-D SINGLE USE SWABS REGULAR) PADS Use to cleans area prior to checking blood sugar three times daily and as needed.  Diagnosis:  E11.22  Insulin dependent.  Marland Kitchen amLODipine (NORVASC) 5 MG tablet Take 1 tablet (5 mg total) by mouth daily.  Marland Kitchen aspirin 81 MG tablet Take 324 mg by mouth daily.   Marland Kitchen atorvastatin (LIPITOR) 80 MG tablet Take 1 tablet (80 mg total) by mouth at bedtime.  . benzonatate (TESSALON) 100 MG capsule Take 1-2 capsules (100-200 mg total) by mouth 3 (three) times daily as needed.  . Blood Glucose Monitoring Suppl (ACCU-CHEK AVIVA PLUS) w/Device KIT Use to check blood sugar three times daily and as needed.  Diagnosis: E11.22  Insulin-dependent  . carvedilol (COREG) 6.25 MG tablet Take 1 tablet (6.25 mg total) by mouth 2 (two) times daily with a meal.  . cloNIDine (CATAPRES) 0.3 MG tablet  Take 1 tablet (0.3 mg total) by mouth 3 (three) times daily.  . clopidogrel (PLAVIX) 75 MG tablet TAKE 1 TABLET EVERY DAY  . diclofenac sodium (VOLTAREN) 1 % GEL Apply 2 g topically 2 (two) times daily as needed (for left knee pain.).  Marland Kitchen diphenhydrAMINE (BENADRYL) 50 MG tablet Take 50 mg by mouth at bedtime.   . docusate sodium (COLACE) 100 MG capsule Take 100 mg by mouth daily as needed for mild constipation.   Marland Kitchen erythromycin ophthalmic  ointment Place into the left eye every 6 (six) hours.  Marland Kitchen ezetimibe (ZETIA) 10 MG tablet Take 1 tablet (10 mg total) by mouth daily.  Marland Kitchen gabapentin (NEURONTIN) 400 MG capsule Take 2 tabs in the AM and 3 at night.  Marland Kitchen glucose blood (ACCU-CHEK AVIVA PLUS) test strip Use as instructed to check blood sugar 3 times daily or as needed.  Diagnosis:  E11.22  Insulin-dependent.  . insulin NPH-regular Human (NOVOLIN 70/30) (70-30) 100 UNIT/ML injection Inject 50-70 Units into the skin See admin instructions. Inject 70u under the skin every morning and 50u under the skin at bedtime  . Insulin Syringe-Needle U-100 (INSULIN SYRINGE 1CC/30GX5/16") 30G X 5/16" 1 ML MISC Use as directed to take insulin Dx 250.62  . isosorbide mononitrate (IMDUR) 60 MG 24 hr tablet Take 1.5 tablets (90 mg total) by mouth 2 (two) times daily.  Marland Kitchen levothyroxine (SYNTHROID) 25 MCG tablet Take 1 tablet (25 mcg total) by mouth daily before breakfast.  . nitroGLYCERIN (NITROSTAT) 0.4 MG SL tablet Place 1 tablet (0.4 mg total) under the tongue every 5 (five) minutes as needed. May repeat x3  . omeprazole (PRILOSEC) 20 MG capsule Take 20 mg by mouth daily.    Marland Kitchen oxyCODONE (ROXICODONE) 15 MG immediate release tablet Take 1 tablet (15 mg total) by mouth daily as needed for pain.  . potassium chloride (K-DUR) 10 MEQ tablet Take 1-2 tablets (10-20 mEq total) by mouth as directed. Alternating every other day. Take 1 tablet (10 mEq) daily and 2 tablets (20 mEq) daily  . sildenafil (REVATIO) 20 MG tablet Take 1 tablet (20 mg total) by mouth 3 (three) times daily.  . tamsulosin (FLOMAX) 0.4 MG CAPS capsule Take 1 capsule (0.4 mg total) by mouth at bedtime.  . torsemide (DEMADEX) 20 MG tablet Take 1-2 tablets (20-40 mg total) by mouth as directed. Alternate every other day. 1 tablet (20 mg) daily and 2 tablets (40 mg) daily   No facility-administered encounter medications on file as of 12/25/2018.     Functional Status:  In your present state of  health, do you have any difficulty performing the following activities: 12/20/2018 09/25/2018  Hearing? N N  Vision? Y Y  Comment - retinopathy ,scarring of cornea with recent fall   Difficulty concentrating or making decisions? N N  Walking or climbing stairs? Y Y  Comment - shortness of breath   Dressing or bathing? N Y  Comment - wife assist as needed   Doing errands, shopping? Y Y  Comment - wife provides Agricultural engineer and eating ? N N  Using the Toilet? N N  In the past six months, have you accidently leaked urine? N N  Do you have problems with loss of bowel control? N N  Managing your Medications? N N  Managing your Finances? N N  Housekeeping or managing your Housekeeping? N N  Comment - wife able to complete   Some recent data might be hidden  Fall/Depression Screening: Fall Risk  12/20/2018 09/25/2018 09/17/2017  Falls in the past year? 1 1 Yes  Comment lost balance and fell through wall; subsequent hospitalization - multiple falls secondary to sepsis; denies injury  Number falls in past yr: 0 0 2 or more  Injury with Fall? 1 1 No  Risk for fall due to : History of fall(s);Impaired balance/gait;Impaired mobility History of fall(s) -  Follow up - Falls prevention discussed -   PHQ 2/9 Scores 12/20/2018 09/25/2018 12/21/2017 09/17/2017 02/27/2017 02/02/2017 01/31/2017  PHQ - 2 Score 0 0 0 0 0 0 0  PHQ- 9 Score 0 - - 0 - - -    Assessment:   Patient will continue to benefit from disease management of chronic medical conditions, Diabetes, heart failure. Discussed next step being health coach that will continue to monitor telephonically and provide education and support, patient is agreeable .   Appointments Dr.Bensihmon on 7/16.   Plan:  Will close to complex case management and transition to Disease management .  Will send PCP discipline closure letter.    Joylene Draft, RN, [place] Management Coordinator  [phone]-  Mobile [phone]- Toll Free Main Office

## 2018-12-26 ENCOUNTER — Other Ambulatory Visit: Payer: Self-pay | Admitting: *Deleted

## 2018-12-26 ENCOUNTER — Ambulatory Visit: Payer: Self-pay | Admitting: *Deleted

## 2018-12-26 DIAGNOSIS — E785 Hyperlipidemia, unspecified: Secondary | ICD-10-CM | POA: Insufficient documentation

## 2018-12-26 DIAGNOSIS — Z7189 Other specified counseling: Secondary | ICD-10-CM | POA: Insufficient documentation

## 2018-12-26 DIAGNOSIS — E039 Hypothyroidism, unspecified: Secondary | ICD-10-CM | POA: Insufficient documentation

## 2018-12-26 NOTE — Assessment & Plan Note (Signed)
See above

## 2018-12-26 NOTE — Assessment & Plan Note (Signed)
Living will d/w pt.  Wife designated if patient were incapacitated.   ?

## 2019-01-06 ENCOUNTER — Telehealth: Payer: Self-pay | Admitting: Family Medicine

## 2019-01-06 NOTE — Telephone Encounter (Signed)
Pt's mailbox full did not leave message but below is the reason I am calling just in case they call back.   Message Received: 1 week ago Message Contents  [name], MD  Genella Rife H        Needs f/u set up in 3 months. Labs ahead if possible.

## 2019-01-08 DIAGNOSIS — E113213 Type 2 diabetes mellitus with mild nonproliferative diabetic retinopathy with macular edema, bilateral: Secondary | ICD-10-CM | POA: Diagnosis not present

## 2019-01-16 ENCOUNTER — Other Ambulatory Visit: Payer: Self-pay

## 2019-01-16 ENCOUNTER — Ambulatory Visit (HOSPITAL_COMMUNITY)
Admission: RE | Admit: 2019-01-16 | Discharge: 2019-01-16 | Disposition: A | Discharge status: Home / Self Care | Payer: Medicare HMO | Source: Ambulatory Visit | Attending: Internal Medicine | Admitting: Internal Medicine

## 2019-01-16 DIAGNOSIS — N183 Chronic kidney disease, stage 3 unspecified: Secondary | ICD-10-CM

## 2019-01-16 DIAGNOSIS — I5032 Chronic diastolic (congestive) heart failure: Secondary | ICD-10-CM

## 2019-01-16 DIAGNOSIS — I270 Primary pulmonary hypertension: Secondary | ICD-10-CM | POA: Diagnosis not present

## 2019-01-16 DIAGNOSIS — I2510 Atherosclerotic heart disease of native coronary artery without angina pectoris: Secondary | ICD-10-CM

## 2019-01-16 NOTE — Addendum Note (Signed)
Encounter addended by: Marlise Eves, RN on: 01/16/2019 2:51 PM  Actions taken: Clinical Note Signed

## 2019-01-16 NOTE — Progress Notes (Signed)
[place] - can you check on his orders for PFTs with [facility] at [facility]. We ordered in 4/20 but not yet done. (may be because of COVID)   F/u with me in 6 months.    Sent to scheduler in order to get scheduled.

## 2019-01-16 NOTE — Patient Instructions (Signed)
Please follow up with the [facility] in 6 months.   Someone will be calling you in order to set up your appointment for your pft's at [facility].   At the [facility], you and your health needs are our priority. As part of our continuing mission to provide you with exceptional heart care, we have created designated Provider Care Teams. These Care Teams include your primary Cardiologist (physician) and Advanced Practice Providers (APPs- Physician Assistants and Nurse Practitioners) who all work together to provide you with the care you need, when you need it.   You may see any of the following providers on your designated Care Team at your next follow up: Marland Kitchen Dr Glori Bickers . Dr Loralie Champagne . Darrick Grinder, NP

## 2019-01-16 NOTE — Progress Notes (Signed)
Heart Failure TeleHealth Note  Due to national recommendations of social distancing due to [place] 19, Audio/video telehealth visit is felt to be most appropriate for this patient at this time.  See MyChart message from today for patient consent regarding telehealth for [facility]  Date:  01/16/2019   ID:  Stephen [name], DOB [birth year], MRN [id number]  Location: Home  Provider location: [facility] Type of Visit: New patient  PCP:  [name], MD  Cardiologist:  Ida Rogue, MD Primary HF: B[place]hon  Chief Complaint: Pulmonary HTN   History of Present Illness:  Stephen [name] is a 73 y.o. male with multiple medical problems including   1. Morbid obesity 2  Pulmonary fibrosis s/p wedge resection 3. 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 OSA on CPAP 5. DM 6  CKD (baseline creatinine 2.0-2.5) 7.  Left renal arrtery stenosis s/p stenting,  8.  Chronic diastolic HF, EF 25-00% in 3/20 9.  CAD with chronic CP s/p stenting of his RCA/LCX.   -Cath 7/18  1. Significant 2 vessel coronary artery disease with occluded stent in the mid left circumflex with collaterals noted from the LAD and right coronary artery. Patent RCA stent with 70% stenosis distal to the stent in the midsegment and 50% stenosis in the distal segment. Moderate LAD disease.  2. Moderately elevated left ventricular end-diastolic pressure with LVEDP of 25 mmHg.  Referred by Dr. Rockey Situ in 4/20 for further evaluation of Pulmonary HTN  Was hospitalized in 3/20 with syncope. Work-up revealed pulmonary HTN by echo and severe hypoxia even after aggressive diuresis. Wife reported oxygen saturations high 70s at home if he didnot use his oxygen with walking around the house. Right heart pressures 50 mm per mercury on echo even after aggressive diuresis. He did not want to stay in the hospital for right heart catheterization, preferred to go home. Started on sildenafil  20 tid  Echo 09/12/18: EF 45-50% Normal RV. Mild TR. RVSP 103mHg  We had our first televisit with him in 4/20 and felt PH was due to diastolic HF and hypoxic lung disease. PFTs ordered but not completed yet. Stressed need for weight loss as well to comply with O2 and CPAP and closer management of BP and volume intake.   He presents via aEngineer, civil (consulting)for a telehealth visit today. Says he is doing pretty good. O2 sats upper 90s with oxygen. Without oxygen: sitting and with movement stays 94-95%. Using CPAP every night. Takes torsemide 20 mg most days but will alternate with '40mg'$  at times. Hasn't checked his weight in about 1 week.  SBPs 130-140s. No edema in am but gets some by the end of the day. No orthopnea or PND. No CP.   RTilda Burrowdenies symptoms worrisome for COVID 19.   Past Medical History:  Diagnosis Date  . Adrenal gland anomaly   . Arthritis   . CAD (coronary artery disease)    a. 04/2006 MI and PCI/stenting to mLCx & mRCA; b. 07/2009 Cath: patent LCX/RCA stents;  c. 12/2016 NSTEMI/Cath: LM 30, LAD 30p, 542m30d, D1/2/3 min irregs, LCX 10073mR, OM1 min irregs, RCA 20p, 10 ISR, 11m77md, RPDA min irregs, RPAV 40-->Med Rx; d. 12/2017 MV: fixed lateral wall scar, mild anterior/anterior septal ischemia. EF 30-44%.  . CKD (chronic kidney disease), stage III (HCC)[place]. Colon polyps   . DM2 (diabetes mellitus, type 2) (HCC)[place]  insulin requiring  . Dyslipidemia   . GERD (gastroesophageal reflux disease)   . History of echocardiogram    a. TTE 12/2016: EF 50-55%, mod concentric LVH, images inadequate for wall motion assessment, not technically sufficienct to allow for LV dias fxn, calcified mitral annulus   . HTN (hypertension)   . Kidney stones   . Left Renal artery stenosis ([place])    a. 07/2013 s/p PTA/stenting ([place]); b. 07/2017 Renal Duplex: No significant RAS.  Marland Kitchen Myocardial infarction ([place])   . OSA (obstructive sleep apnea)   . Pulmonary fibrosis ([place])    a. 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  . Recurrent UTI   . Rotator cuff disorder    right  . Skin cancer    head  . Thyroid disorder   . Venous insufficiency of both lower extremities    a. s/p laser treatment.   Past Surgical History:  Procedure Laterality Date  . adrenal adenoma removal  1990-   right  . CARPAL TUNNEL RELEASE     bilateral  . CATARACT EXTRACTION W/PHACO Right 07/08/2018   Procedure: CATARACT EXTRACTION PHACO AND INTRAOCULAR LENS PLACEMENT (IOC) RIGHT DIABETIC;  Surgeon: Eulogio Bear, MD;  Location: [place];  Service: Ophthalmology;  Laterality: Right;  Diabetic - insulin sleep apnea  . CATARACT EXTRACTION W/PHACO Left 08/05/2018   Procedure: CATARACT EXTRACTION PHACO AND INTRAOCULAR LENS PLACEMENT ([place]) LEFT DIABETIC;  Surgeon: Eulogio Bear, MD;  Location: [facility];  Service: Ophthalmology;  Laterality: Left;  DIABETIC  . CORONARY ANGIOPLASTY WITH STENT PLACEMENT  2007   x 2  . KNEE SURGERY     right  . LEFT HEART CATH AND CORONARY ANGIOGRAPHY N/A 01/11/2017   Procedure: Left Heart Cath and Coronary Angiography;  Surgeon: Wellington Hampshire, MD;  Location: [facility];  Service: Cardiovascular;  Laterality: N/A;  . LUNG BIOPSY    . RENAL ARTERY STENT  2015   L  . SKIN CANCER EXCISION     back of head     Current Outpatient Medications  Medication Sig Dispense Refill  . ACCU-CHEK SOFTCLIX LANCETS lancets Use as instructed to check blood sugar three times daily and as needed.  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 100 each 5  . acetaminophen (TYLENOL) 325 MG tablet Take 2 tablets (650 mg total) by mouth every 6 (six) hours as needed for mild pain (or Fever >/= 101).    Marland Kitchen albuterol (VENTOLIN HFA) 108 (90 Base) MCG/ACT inhaler Inhale 2 puffs into the lungs every 6 (six) hours as needed for wheezing or shortness of breath. Dispense 3 inhalers. 48 g 3  . Alcohol Swabs (B-D SINGLE USE  SWABS REGULAR) PADS Use to cleans area prior to checking blood sugar three times daily and as needed.  Diagnosis:  E11.22  Insulin dependent. 100 each 5  . amLODipine (NORVASC) 5 MG tablet Take 1 tablet (5 mg total) by mouth daily. 90 tablet 3  . aspirin 81 MG tablet Take 324 mg by mouth daily.     Marland Kitchen atorvastatin (LIPITOR) 80 MG tablet Take 1 tablet (80 mg total) by mouth at bedtime.    . benzonatate (TESSALON) 100 MG capsule Take 1-2 capsules (100-200 mg total) by mouth 3 (three) times daily as needed. 30 capsule 0  . Blood Glucose Monitoring Suppl (ACCU-CHEK AVIVA PLUS) w/Device KIT Use to check blood sugar three times daily and as needed.  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  carvedilol (COREG) 6.25 MG tablet Take 1 tablet (6.25 mg total) by mouth 2 (two) times daily with a meal. 180 tablet 3  . cloNIDine (CATAPRES) 0.3 MG tablet Take 1 tablet (0.3 mg total) by mouth 3 (three) times daily. 270 tablet 3  . clopidogrel (PLAVIX) 75 MG tablet TAKE 1 TABLET EVERY DAY 90 tablet 1  . diclofenac sodium (VOLTAREN) 1 % GEL Apply 2 g topically 2 (two) times daily as needed (for left knee pain.). 100 g 1  . diphenhydrAMINE (BENADRYL) 50 MG tablet Take 50 mg by mouth at bedtime.     . docusate sodium (COLACE) 100 MG capsule Take 100 mg by mouth daily as needed for mild constipation.     Marland Kitchen erythromycin ophthalmic ointment Place into the left eye every 6 (six) hours. 3.5 g 0  . ezetimibe (ZETIA) 10 MG tablet Take 1 tablet (10 mg total) by mouth daily. 90 tablet 3  . gabapentin (NEURONTIN) 400 MG capsule Take 2 tabs in the AM and 3 at night. 450 capsule 3  . glucose blood (ACCU-CHEK AVIVA PLUS) test strip Use as instructed to check blood sugar 3 times daily or as needed.  Diagnosis:  E11.22  Insulin-dependent. 300 each 1  . insulin NPH-regular Human (NOVOLIN 70/30) (70-30) 100 UNIT/ML injection Inject 50-70 Units into the skin See admin instructions. Inject 70u under the skin every morning and 50u  under the skin at bedtime    . Insulin Syringe-Needle U-100 (INSULIN SYRINGE 1CC/30GX5/16") 30G X 5/16" 1 ML MISC Use as directed to take insulin Dx 250.62 180 each 1  . isosorbide mononitrate (IMDUR) 60 MG 24 hr tablet Take 1.5 tablets (90 mg total) by mouth 2 (two) times daily. 270 tablet 3  . levothyroxine (SYNTHROID) 25 MCG tablet Take 1 tablet (25 mcg total) by mouth daily before breakfast. 90 tablet 3  . nitroGLYCERIN (NITROSTAT) 0.4 MG SL tablet Place 1 tablet (0.4 mg total) under the tongue every 5 (five) minutes as needed. May repeat x3 25 tablet prn  . omeprazole (PRILOSEC) 20 MG capsule Take 20 mg by mouth daily.      Marland Kitchen oxyCODONE (ROXICODONE) 15 MG immediate release tablet Take 1 tablet (15 mg total) by mouth daily as needed for pain. 30 tablet 0  . potassium chloride (K-DUR) 10 MEQ tablet Take 1-2 tablets (10-20 mEq total) by mouth as directed. Alternating every other day. Take 1 tablet (10 mEq) daily and 2 tablets (20 mEq) daily 180 tablet 3  . sildenafil (REVATIO) 20 MG tablet Take 1 tablet (20 mg total) by mouth 3 (three) times daily. 270 tablet 1  . tamsulosin (FLOMAX) 0.4 MG CAPS capsule Take 1 capsule (0.4 mg total) by mouth at bedtime. 30 capsule 0  . torsemide (DEMADEX) 20 MG tablet Take 1-2 tablets (20-40 mg total) by mouth as directed. Alternate every other day. 1 tablet (20 mg) daily and 2 tablets (40 mg) daily 180 tablet 3   No current facility-administered medications for this encounter.     Allergies:   Calcium channel blockers   Social History:  The patient  reports that he has never smoked. He has never used smokeless tobacco. He reports that he does not drink alcohol or use drugs.   Family History:  The patient's family history includes Arthritis in his maternal grandmother; Breast cancer in his maternal aunt; COPD in his sister; Diabetes in his maternal aunt; Heart attack in his brother, father, and mother; Hypertension in his maternal aunt and another  family member.    ROS:  Please see the history of present illness.   All other systems are personally reviewed and negative.   Exam:  (Video/Tele Health Call; Exam is subjective and or/visual.) General:  Speaks in full sentences. No resp difficulty. Lungs: Normal respiratory effort with conversation.  Abdomen: Obese. Non-distended per patient report Extremities: Pt denies edema. Neuro: Alert & oriented x 3.   Recent Labs: 09/20/2018: B Natriuretic Peptide 149.0 09/21/2018: Hemoglobin 14.1; Platelets 209 12/23/2018: ALT 13; BUN 25; Creatinine, Ser 1.82; Potassium 4.2; Sodium 141; TSH 9.41  Personally reviewed   Wt Readings from Last 3 Encounters:  09/24/18 118.9 kg (262 lb 1 oz)  09/22/18 116.2 kg (256 lb 2.8 oz)  09/16/18 118.8 kg (262 lb)      ASSESSMENT AND PLAN:  1.Pulmonary HTN - mild to moderate by echo 3/20 with RVSP ~50 after diuresis - Likely a combination of WHO Group II (HF) and III (hypoxic lung disease) - I reinforced the need for the following      1. Wear O2 and CPAP religiously to keep sats >= 90%      2. Volume and BP control - will follow with Dr. Rockey Situ      3. Lose weight. Discussed [place] reschedule PFTs with [facility] at [facility] - Can continue sildenafil if he feels it is helping - F/u with me in [facility] 6 months. Can consider RHC (after COVID crisis) as needed  2. Chronic diastolic HF - Volume status seems stable - Reinforced need for daily weights and reviewed use of sliding scale diuretics.  3. CKD 3 - Follows with Dr. Eduard Clos in Nephrology  4. CAD - stable. No s/s ischemia. followed by Dr. Rockey Situ   5. Morbid obesity  - again encouraged need for weight loss with [place] screen The patient does not have any symptoms that suggest any further testing/ screening at this time.  Social distancing reinforced today.  Recommended follow-up:  As above  Relevant cardiac medications were reviewed at length with the patient today.   The  patient does not have concerns regarding their medications at this time.   The following changes were made today:  As above  Today, I have spent 18 minutes with the patient with telehealth technology discussing the above issues .    Signed, Glori Bickers, MD  01/16/2019 1:58 PM  Advanced [facility] Failure [place] [address] Heart and [place] [address] [phone] (office) [phone] (fax)

## 2019-01-20 NOTE — Telephone Encounter (Signed)
Pt is scheduled for labs on 03/21/19 and ov on 03/27/19

## 2019-01-23 ENCOUNTER — Other Ambulatory Visit: Payer: Self-pay | Admitting: *Deleted

## 2019-01-23 DIAGNOSIS — E113213 Type 2 diabetes mellitus with mild nonproliferative diabetic retinopathy with macular edema, bilateral: Secondary | ICD-10-CM | POA: Diagnosis not present

## 2019-01-23 NOTE — Patient Outreach (Addendum)
[facility]) Care Management  01/23/2019  Stephen [name] 1946/02/15 [id number]  RN Health Coach attempted follow up outreach call to patient.  Patient was unavailable. Per message the mailbox has not been set up yet. Plan: RN will call patient again within 30 days.  [place] Care Management [phone]

## 2019-02-03 DIAGNOSIS — R809 Proteinuria, unspecified: Secondary | ICD-10-CM | POA: Diagnosis not present

## 2019-02-03 DIAGNOSIS — I701 Atherosclerosis of renal artery: Secondary | ICD-10-CM | POA: Diagnosis not present

## 2019-02-03 DIAGNOSIS — E1122 Type 2 diabetes mellitus with diabetic chronic kidney disease: Secondary | ICD-10-CM | POA: Diagnosis not present

## 2019-02-03 DIAGNOSIS — R600 Localized edema: Secondary | ICD-10-CM | POA: Diagnosis not present

## 2019-02-03 DIAGNOSIS — N183 Chronic kidney disease, stage 3 (moderate): Secondary | ICD-10-CM | POA: Diagnosis not present

## 2019-02-03 DIAGNOSIS — I10 Essential (primary) hypertension: Secondary | ICD-10-CM | POA: Diagnosis not present

## 2019-02-04 ENCOUNTER — Encounter: Payer: Self-pay | Admitting: Cardiovascular Disease

## 2019-02-07 ENCOUNTER — Telehealth: Payer: Self-pay

## 2019-02-07 NOTE — Telephone Encounter (Signed)
Patient advised and appointment made 

## 2019-02-07 NOTE — Telephone Encounter (Signed)
Called patient because we need to get him in for nurse visit to get O2 settings done for a form with [employer]. I was not able to leave a message. Will try and call again alter today about this.

## 2019-02-11 ENCOUNTER — Telehealth: Payer: Self-pay | Admitting: Cardiovascular Disease

## 2019-02-11 ENCOUNTER — Other Ambulatory Visit: Payer: Self-pay

## 2019-02-11 ENCOUNTER — Ambulatory Visit: Payer: Medicare HMO

## 2019-02-11 DIAGNOSIS — R0602 Shortness of breath: Secondary | ICD-10-CM

## 2019-02-11 DIAGNOSIS — I5032 Chronic diastolic (congestive) heart failure: Secondary | ICD-10-CM

## 2019-02-11 NOTE — Telephone Encounter (Signed)
Patient wife wants to know appointment time for covid screening for upcoming procedure

## 2019-02-11 NOTE — Progress Notes (Signed)
Patient came by for O2 check for re certification purposes with [employer] for oxygen. O2 readings have been documented in vitals section and form faxed over to [place].

## 2019-02-11 NOTE — Telephone Encounter (Signed)
Call to patient, he reports upcoming procedure and is wanting to know the appt time for covid testing.   I provided him with number to call PAT.   Advised pt to call for any further questions or concerns.

## 2019-02-13 ENCOUNTER — Other Ambulatory Visit
Admission: RE | Admit: 2019-02-13 | Discharge: 2019-02-13 | Disposition: A | Discharge status: Home / Self Care | Payer: Medicare HMO | Source: Ambulatory Visit | Attending: Cardiovascular Disease | Admitting: Cardiovascular Disease

## 2019-02-13 ENCOUNTER — Other Ambulatory Visit: Payer: Self-pay

## 2019-02-13 DIAGNOSIS — Z20828 Contact with and (suspected) exposure to other viral communicable diseases: Secondary | ICD-10-CM | POA: Insufficient documentation

## 2019-02-13 DIAGNOSIS — Z01812 Encounter for preprocedural laboratory examination: Secondary | ICD-10-CM | POA: Insufficient documentation

## 2019-02-14 LAB — SARS CORONAVIRUS 2 (TAT 6-24 HRS): SARS Coronavirus 2: NEGATIVE

## 2019-02-17 ENCOUNTER — Other Ambulatory Visit: Payer: Self-pay

## 2019-02-17 ENCOUNTER — Ambulatory Visit: Payer: Medicare HMO

## 2019-02-17 ENCOUNTER — Ambulatory Visit: Payer: Medicare HMO | Attending: Internal Medicine

## 2019-02-17 DIAGNOSIS — I270 Primary pulmonary hypertension: Secondary | ICD-10-CM | POA: Insufficient documentation

## 2019-02-17 DIAGNOSIS — I5032 Chronic diastolic (congestive) heart failure: Secondary | ICD-10-CM | POA: Diagnosis not present

## 2019-02-17 MED ORDER — ALBUTEROL SULFATE (2.5 MG/3ML) 0.083% IN NEBU
2.5000 mg | INHALATION_SOLUTION | Freq: Once | RESPIRATORY_TRACT | Status: AC
  Administered 2019-02-17: 2.5 mg via RESPIRATORY_TRACT
  Filled 2019-02-17: qty 3

## 2019-02-19 DIAGNOSIS — E113213 Type 2 diabetes mellitus with mild nonproliferative diabetic retinopathy with macular edema, bilateral: Secondary | ICD-10-CM | POA: Diagnosis not present

## 2019-02-24 ENCOUNTER — Ambulatory Visit (INDEPENDENT_AMBULATORY_CARE_PROVIDER_SITE_OTHER): Payer: Medicare HMO | Admitting: Nurse Practitioner

## 2019-02-24 ENCOUNTER — Other Ambulatory Visit (INDEPENDENT_AMBULATORY_CARE_PROVIDER_SITE_OTHER): Payer: Self-pay | Admitting: Nephrology

## 2019-02-24 ENCOUNTER — Ambulatory Visit (INDEPENDENT_AMBULATORY_CARE_PROVIDER_SITE_OTHER): Payer: Medicare HMO

## 2019-02-24 ENCOUNTER — Telehealth: Payer: Self-pay | Admitting: *Deleted

## 2019-02-24 ENCOUNTER — Encounter (INDEPENDENT_AMBULATORY_CARE_PROVIDER_SITE_OTHER): Payer: Self-pay | Admitting: Nurse Practitioner

## 2019-02-24 ENCOUNTER — Other Ambulatory Visit: Payer: Self-pay

## 2019-02-24 VITALS — BP 140/78 | HR 72 | Resp 14 | Ht 65.0 in | Wt 269.0 lb

## 2019-02-24 DIAGNOSIS — I10 Essential (primary) hypertension: Secondary | ICD-10-CM

## 2019-02-24 DIAGNOSIS — I701 Atherosclerosis of renal artery: Secondary | ICD-10-CM

## 2019-02-24 DIAGNOSIS — E785 Hyperlipidemia, unspecified: Secondary | ICD-10-CM | POA: Diagnosis not present

## 2019-02-24 MED ORDER — POLYETHYLENE GLYCOL 3350 17 GM/SCOOP PO POWD: 17.0000 g | Freq: Every day | ORAL | 1 refills | Status: DC | PRN

## 2019-02-24 NOTE — Progress Notes (Signed)
SUBJECTIVE:  Patient ID: Stephen [name], male    DOB: 12/05/1945, 73 y.o.   MRN: [id number] Chief Complaint  Patient presents with  . Follow-up    HPI  Stephen [name] is a 73 y.o. male that was referred back to our office by Dr. Zollie Scale with concern for worsening renal artery stenosis.  The patient has had a recent episode of acute kidney injury as well as some fluctuating kidney function.  The patient has a history of left renal artery stenosis.  At this time he denies any issues with his blood pressure control.  The patient does have a history of pulmonary hypertension is currently on home O2.  He denies any fever, chills, nausea, vomiting or diarrhea.  At this time it is noted that there are no evidence of stenosis within the bilateral renal arteries.  The kidneys both have normal size as well as a normal resistive index.  It is also worth noting that the kidneys are slightly smaller than the previous studies done on 07/12/2017.  Past Medical History:  Diagnosis Date  . Adrenal gland anomaly   . Arthritis   . CAD (coronary artery disease)    a. 04/2006 MI and PCI/stenting to mLCx & mRCA; b. 07/2009 Cath: patent LCX/RCA stents;  c. 12/2016 NSTEMI/Cath: LM 30, LAD 30p, 81m 30d, D1/2/3 min irregs, LCX 1082mSR, OM1 min irregs, RCA 20p, 10 ISR, 7047m0d, RPDA min irregs, RPAV 40-->Med Rx; d. 12/2017 MV: fixed lateral wall scar, mild anterior/anterior septal ischemia. EF 30-44%.  . CKD (chronic kidney disease), stage III ([place] . Colon polyps   . DM2 (diabetes mellitus, type 2) (HCC)    insulin requiring  . Dyslipidemia   . GERD (gastroesophageal reflux disease)   . History of echocardiogram    a. TTE 12/2016: EF 50-55%, mod concentric LVH, images inadequate for wall motion assessment, not technically sufficienct to allow for LV dias fxn, calcified mitral annulus   . HTN (hypertension)   . Kidney stones   . Left Renal artery stenosis ([place]  a. 07/2013 s/p PTA/stenting ([place]); b. 07/2017  Renal Duplex: No significant RAS.  . MMarland Kitchenocardial infarction ([facility] . OSA (obstructive sleep apnea)   . Pulmonary fibrosis ([facility]  a. 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  . Recurrent UTI   . Rotator cuff disorder    right  . Skin cancer    head  . Thyroid disorder   . Venous insufficiency of both lower extremities    a. s/p laser treatment.    Past Surgical History:  Procedure Laterality Date  . adrenal adenoma removal  1990-   right  . CARPAL TUNNEL RELEASE     bilateral  . CATARACT EXTRACTION W/PHACO Right 07/08/2018   Procedure: CATARACT EXTRACTION PHACO AND INTRAOCULAR LENS PLACEMENT (IOC) RIGHT DIABETIC;  Surgeon: KinEulogio BearD;  [place];  Laterality: Right;  Diabetic - insulin sleep apnea  . CATARACT EXTRACTION W/PHACO Left 08/05/2018   Procedure: CATARACT EXTRACTION PHACO AND INTRAOCULAR LENS PLACEMENT ([place]OC[place] Creek[place]FT DIABETIC;  Surgeon: KinEulogio BearD;  [place] [facility];  Laterality: Left;  DIABETIC  . CORONARY ANGIOPLASTY WITH STENT PLACEMENT  2007   x 2  . KNEE SURGERY     right  . LEFT HEART CATH AND CORONARY ANGIOGRAPHY N/A 01/11/2017   Procedure: Left Heart Cath and Coronary Angiography;  Surgeon: Wellington Hampshire, MD;  Location: [facility];  Service: Cardiovascular;  Laterality: N/A;  . LUNG BIOPSY    . RENAL ARTERY STENT  2015   L  . SKIN CANCER EXCISION     back of head    Social History   Socioeconomic History  . Marital status: Married    Spouse name: Not on file  . Number of children: 2  . Years of education: Not on file  . Highest education level: Not on file  Occupational History  . 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 Financial resource strain:  Not on file  . Food insecurity    Worry: Not on file    Inability: Not on file  . Transportation needs    Medical: Not on file    Non-medical: Not on file  Tobacco Use  . Smoking status: Never Smoker  . Smokeless tobacco: Never Used  Substance and Sexual Activity  . Alcohol use: No    Alcohol/week: 0.0 standard drinks  . Drug use: No  . Sexual activity: Yes  Lifestyle  . Physical activity    Days per week: Not on file    Minutes per session: Not on file  . Stress: Not on file  Relationships  . Social Herbalist on phone: Not on file    Gets together: Not on file    Attends religious service: Not on file    Active member of club or organization: Not on file    Attends meetings of clubs or organizations: Not on file    Relationship status: Not on file  . Intimate partner violence    Fear of current or ex partner: Not on file    Emotionally abused: Not on file    Physically abused: Not on file    Forced sexual activity: Not on file  Other Topics Concern  . Not on file  Social History Narrative   Enjoys fishing   [employer] fan    Family History  Problem Relation Age of Onset  . Heart attack Father   . Heart attack Mother   . Heart attack Brother   . COPD Sister   . Arthritis Maternal Grandmother   . Breast cancer Maternal Aunt   . Diabetes Maternal Aunt   . Hypertension Other   . Hypertension Maternal Aunt   . Prostate cancer Neg Hx   . Kidney cancer Neg Hx   . Bladder Cancer Neg Hx   . Colon cancer Neg Hx     Allergies  Allergen Reactions  . Calcium Channel Blockers     Would avoid if possible due to h/o peripheral edema     Review of Systems   Review of Systems: Negative Unless Checked Constitutional: _0 Weight loss  _1 Fever  _2 Chills Cardiac: _3 Chest pain   _4  Atrial Fibrillation  _5 Palpitations   _6 Shortness of breath when laying flat   _7 Shortness of breath with exertion. _8 Shortness of breath at rest Vascular:  _9 Pain in legs with walking    _10 Pain in legs with standing _11 Pain in legs when laying flat   _12 Claudication    _13 Pain in feet when laying flat    _14 History of DVT   _15 Phlebitis   _16 Swelling in legs   _17 Varicose veins   _18 Non-healing ulcers Pulmonary:   _19 Uses home oxygen   _20 Productive cough   _21   Hemoptysis   _0 Wheeze  _1 COPD   _2 Asthma Neurologic:  _3 Dizziness   _4 Seizures  _5 Blackouts _6 History of stroke   _7 History of TIA  _8 Aphasia   _9 Temporary Blindness   _10 Weakness or numbness in arm   _11 Weakness or numbness in leg Musculoskeletal:   _12 Joint swelling   _13 Joint pain   _14 Low back pain  _15  History of Knee Replacement _16 Arthritis _17 back Surgeries  _18  Spinal Stenosis    Hematologic:  _19 Easy bruising  _20 Easy bleeding   _21 Hypercoagulable state   _22 Anemic Gastrointestinal:  _23 Diarrhea   _24 Vomiting  _25 Gastroesophageal reflux/heartburn   _26 Difficulty swallowing. _27 Abdominal pain Genitourinary:  _28 Chronic kidney disease   _29 Difficult urination  _30 Anuric   _31 Blood in urine _32 Frequent urination  _33 Burning with urination   _34 Hematuria Skin:  _35 Rashes   _36 Ulcers _37 Wounds Psychological:  _38 History of anxiety   _39  History of major depression  _40  Memory Difficulties      OBJECTIVE:   Physical Exam  BP 140/78 (BP Location: Right Wrist, Patient Position: Sitting, Cuff Size: Normal)   Pulse 72   Resp 14   Ht _41  (1.651 m)   Wt 269 lb (122 kg)   BMI 44.76 kg/m   Gen: WD/WN, NAD Head: [place]/AT, No temporalis wasting.  Ear/Nose/Throat: Hearing grossly intact, nares w/o erythema or drainage Eyes: PER, EOMI, sclera nonicteric.  Neck: Supple, no masses.  No JVD.  Pulmonary:  Good air movement, no use of accessory muscles.  Uses home O2 Cardiac: RRR Vascular:  Vessel Right Left  Radial Palpable Palpable   Gastrointestinal: soft, non-distended. No guarding/no peritoneal signs.  Musculoskeletal: M/S 5/5 throughout.  No deformity or atrophy.  Neurologic: Pain and light touch intact in extremities.  Symmetrical.  Speech is fluent.  Motor exam as listed above. Psychiatric: Judgment intact, Mood & affect appropriate for pt's clinical situation. Dermatologic: No Venous rashes. No Ulcers Noted.  No changes consistent with cellulitis. Lymph : No Cervical lymphadenopathy, no lichenification or skin changes of chronic lymphedema.       ASSESSMENT AND PLAN:  1. Renal artery stenosis (HCC) Given patient's arterial disease optimal control of the patient's hypertension is important. BP is acceptable today  The patient's vital signs and noninvasive studies support the renal artery stenosis is not significantly increased when compared to the previous study.  No invasive studies or intervention is indicated at this time.  The patient will continue the current antihypertensive medications, no changes at this time.  The primary medical service will continue aggressive antihypertensive therapy as per the AHA guidelines  - VAS US RENAL ARTERY DUPLEX; Future  2. Hyperlipidemia with target low density lipoprotein (LDL) cholesterol less than 70 mg/dL Continue statin as ordered and reviewed, no changes at this time   3. Essential hypertension, benign Continue antihypertensive medications as already ordered, these medications have been reviewed and there are no changes at this time.    Current Outpatient Medications on File Prior to Visit  Medication Sig Dispense Refill  . acetaminophen (TYLENOL) 325 MG tablet Take 2 tablets (650 mg total) by mouth every 6 (six) hours as needed for mild pain (or Fever >/= 101).    Marland Kitchen albuterol (VENTOLIN HFA) 108 (90 Base) MCG/ACT inhaler Inhale 2 puffs into the lungs every 6 (six) hours as needed for wheezing or shortness of breath. Dispense 3 inhalers. 48 g 3  . amLODipine (NORVASC) 5 MG tablet Take 1 tablet (5 mg total) by mouth daily. 90 tablet 3  . aspirin 81 MG tablet Take 324 mg by mouth  daily.     . atorvastatin (LIPITOR) 80 MG tablet Take 1 tablet (80 mg total) by mouth at bedtime.    .  benzonatate (TESSALON) 100 MG capsule Take 1-2 capsules (100-200 mg total) by mouth 3 (three) times daily as needed. 30 capsule 0  . carvedilol (COREG) 6.25 MG tablet Take 1 tablet (6.25 mg total) by mouth 2 (two) times daily with a meal. 180 tablet 3  . cloNIDine (CATAPRES) 0.3 MG tablet Take 1 tablet (0.3 mg total) by mouth 3 (three) times daily. 270 tablet 3  . clopidogrel (PLAVIX) 75 MG tablet TAKE 1 TABLET EVERY DAY 90 tablet 1  . diclofenac sodium (VOLTAREN) 1 % GEL Apply 2 g topically 2 (two) times daily as needed (for left knee pain.). 100 g 1  . diphenhydrAMINE (BENADRYL) 50 MG tablet Take 50 mg by mouth at bedtime.     . docusate sodium (COLACE) 100 MG capsule Take 100 mg by mouth daily as needed for mild constipation.     Marland Kitchen erythromycin ophthalmic ointment Place into the left eye every 6 (six) hours. 3.5 g 0  . ezetimibe (ZETIA) 10 MG tablet Take 1 tablet (10 mg total) by mouth daily. 90 tablet 3  . gabapentin (NEURONTIN) 400 MG capsule Take 2 tabs in the AM and 3 at night. 450 capsule 3  . insulin NPH-regular Human (NOVOLIN 70/30) (70-30) 100 UNIT/ML injection Inject 50-70 Units into the skin See admin instructions. Inject 70u under the skin every morning and 50u under the skin at bedtime    . isosorbide mononitrate (IMDUR) 60 MG 24 hr tablet Take 1.5 tablets (90 mg total) by mouth 2 (two) times daily. 270 tablet 3  . levothyroxine (SYNTHROID) 25 MCG tablet Take 1 tablet (25 mcg total) by mouth daily before breakfast. 90 tablet 3  . nitroGLYCERIN (NITROSTAT) 0.4 MG SL tablet Place 1 tablet (0.4 mg total) under the tongue every 5 (five) minutes as needed. May repeat x3 25 tablet prn  . omeprazole (PRILOSEC) 20 MG capsule Take 20 mg by mouth daily.      Marland Kitchen oxyCODONE (ROXICODONE) 15 MG immediate release tablet Take 1 tablet (15 mg total) by mouth daily as needed for pain. 30 tablet 0  . potassium chloride (K-DUR) 10 MEQ tablet Take 1-2 tablets (10-20 mEq total) by mouth as directed.  Alternating every other day. Take 1 tablet (10 mEq) daily and 2 tablets (20 mEq) daily 180 tablet 3  . sildenafil (REVATIO) 20 MG tablet Take 1 tablet (20 mg total) by mouth 3 (three) times daily. 270 tablet 1  . tamsulosin (FLOMAX) 0.4 MG CAPS capsule Take 1 capsule (0.4 mg total) by mouth at bedtime. 30 capsule 0  . torsemide (DEMADEX) 20 MG tablet Take 1-2 tablets (20-40 mg total) by mouth as directed. Alternate every other day. 1 tablet (20 mg) daily and 2 tablets (40 mg) daily 180 tablet 3  . ACCU-CHEK SOFTCLIX LANCETS lancets Use as instructed to check blood sugar three times daily and as needed.  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 100 each 5  . Alcohol Swabs (B-D SINGLE USE SWABS REGULAR) PADS Use to cleans area prior to checking blood sugar three times daily and as needed.  Diagnosis:  E11.22  Insulin dependent. 100 each 5  . Blood Glucose Monitoring Suppl (ACCU-CHEK AVIVA PLUS) w/Device KIT Use to check blood sugar three times daily and as needed.  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 glucose blood (ACCU-CHEK AVIVA PLUS) test strip  Use as instructed to check blood sugar 3 times daily or as needed.  Diagnosis:  E11.22  Insulin-dependent. 300 each 1  . Insulin Syringe-Needle U-100 (INSULIN SYRINGE 1CC/30GX5/16") 30G X 5/16" 1 ML MISC Use as directed to take insulin Dx 250.62 180 each 1   No current facility-administered medications on file prior to visit.     There are no Patient Instructions on file for this visit. Return in about 1 year (around 02/24/2020) for Renal stenosis.   Kris Hartmann, NP  This note was completed with Sales executive.  Any errors are purely unintentional.

## 2019-02-24 NOTE — Telephone Encounter (Signed)
Wife advised. 

## 2019-02-24 NOTE — Telephone Encounter (Signed)
Patient left a voicemail stating that he is having problems with constipation. Patient stated that he needs something sent to the pharmacy for constipation and to help keep him regular. Pharmacy [facility]

## 2019-02-24 NOTE — Telephone Encounter (Signed)
I would try miralax daily.  I sent the rx but it may be cheaper OTC.  Update me as needed.  Thanks.

## 2019-02-25 DIAGNOSIS — J449 Chronic obstructive pulmonary disease, unspecified: Secondary | ICD-10-CM | POA: Diagnosis not present

## 2019-02-26 ENCOUNTER — Other Ambulatory Visit: Payer: Self-pay | Admitting: *Deleted

## 2019-02-26 NOTE — Patient Outreach (Signed)
[facility]) Care Management  02/26/2019  Stephen [name] 1945/11/07 [id number]   RN Health Coach attempted follow up outreach call to patient.  Patient was unavailable. Per voice mail the mailbox has not been set up. Plan: RN will call patient again within 30 days.  [facility] [phone]

## 2019-03-05 NOTE — Patient Outreach (Signed)
[facility]) Care Management  02/26/2019 Late entry  Stephen [name] [birth year] [id number]  [place] telephone call to patient.  [place] compliance verified. Per patient he is doing good. He is walking without a cane or a walker. Patient stated that his wife drives him to his appointments. Patient A1C is 7.7. Patient stated that he does not have any symptoms for hyperglycemia. Per patient his blood sugars do not usually get over 200. Patient stated he is having a lot of shoulder pain. Patient has problems with constipation. Patient has agreed to further outreach calls.  Current Medications:  Current Outpatient Medications  Medication Sig Dispense Refill  . ACCU-CHEK SOFTCLIX LANCETS lancets Use as instructed to check blood sugar three times daily and as needed.  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 100 each 5  . acetaminophen (TYLENOL) 325 MG tablet Take 2 tablets (650 mg total) by mouth every 6 (six) hours as needed for mild pain (or Fever >/= 101).    Marland Kitchen albuterol (VENTOLIN HFA) 108 (90 Base) MCG/ACT inhaler Inhale 2 puffs into the lungs every 6 (six) hours as needed for wheezing or shortness of breath. Dispense 3 inhalers. 48 g 3  . Alcohol Swabs (B-D SINGLE USE SWABS REGULAR) PADS Use to cleans area prior to checking blood sugar three times daily and as needed.  Diagnosis:  E11.22  Insulin dependent. 100 each 5  . amLODipine (NORVASC) 5 MG tablet Take 1 tablet (5 mg total) by mouth daily. 90 tablet 3  . aspirin 81 MG tablet Take 324 mg by mouth daily.     Marland Kitchen atorvastatin (LIPITOR) 80 MG tablet Take 1 tablet (80 mg total) by mouth at bedtime.    . benzonatate (TESSALON) 100 MG capsule Take 1-2 capsules (100-200 mg total) by mouth 3 (three) times daily as needed. 30 capsule 0  . Blood Glucose Monitoring Suppl (ACCU-CHEK AVIVA PLUS) w/Device KIT Use to check blood sugar three times daily and as needed.  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 carvedilol (COREG)  6.25 MG tablet Take 1 tablet (6.25 mg total) by mouth 2 (two) times daily with a meal. 180 tablet 3  . cloNIDine (CATAPRES) 0.3 MG tablet Take 1 tablet (0.3 mg total) by mouth 3 (three) times daily. 270 tablet 3  . clopidogrel (PLAVIX) 75 MG tablet TAKE 1 TABLET EVERY DAY 90 tablet 1  . diclofenac sodium (VOLTAREN) 1 % GEL Apply 2 g topically 2 (two) times daily as needed (for left knee pain.). 100 g 1  . diphenhydrAMINE (BENADRYL) 50 MG tablet Take 50 mg by mouth at bedtime.     . docusate sodium (COLACE) 100 MG capsule Take 100 mg by mouth daily as needed for mild constipation.     Marland Kitchen erythromycin ophthalmic ointment Place into the left eye every 6 (six) hours. 3.5 g 0  . ezetimibe (ZETIA) 10 MG tablet Take 1 tablet (10 mg total) by mouth daily. 90 tablet 3  . gabapentin (NEURONTIN) 400 MG capsule Take 2 tabs in the AM and 3 at night. 450 capsule 3  . glucose blood (ACCU-CHEK AVIVA PLUS) test strip Use as instructed to check blood sugar 3 times daily or as needed.  Diagnosis:  E11.22  Insulin-dependent. 300 each 1  . insulin NPH-regular Human (NOVOLIN 70/30) (70-30) 100 UNIT/ML injection Inject 50-70 Units into the skin See admin instructions. Inject 70u under the skin every morning and 50u under the skin at bedtime    . Insulin Syringe-Needle  U-100 (INSULIN SYRINGE 1CC/30GX5/16") 30G X 5/16" 1 ML MISC Use as directed to take insulin Dx 250.62 180 each 1  . isosorbide mononitrate (IMDUR) 60 MG 24 hr tablet Take 1.5 tablets (90 mg total) by mouth 2 (two) times daily. 270 tablet 3  . levothyroxine (SYNTHROID) 25 MCG tablet Take 1 tablet (25 mcg total) by mouth daily before breakfast. 90 tablet 3  . nitroGLYCERIN (NITROSTAT) 0.4 MG SL tablet Place 1 tablet (0.4 mg total) under the tongue every 5 (five) minutes as needed. May repeat x3 25 tablet prn  . omeprazole (PRILOSEC) 20 MG capsule Take 20 mg by mouth daily.      Marland Kitchen oxyCODONE (ROXICODONE) 15 MG immediate release tablet Take 1 tablet (15 mg total)  by mouth daily as needed for pain. 30 tablet 0  . polyethylene glycol powder (GLYCOLAX/MIRALAX) 17 GM/SCOOP powder Take 17 g by mouth daily as needed (for constipation). 255 g 1  . potassium chloride (K-DUR) 10 MEQ tablet Take 1-2 tablets (10-20 mEq total) by mouth as directed. Alternating every other day. Take 1 tablet (10 mEq) daily and 2 tablets (20 mEq) daily 180 tablet 3  . sildenafil (REVATIO) 20 MG tablet Take 1 tablet (20 mg total) by mouth 3 (three) times daily. 270 tablet 1  . tamsulosin (FLOMAX) 0.4 MG CAPS capsule Take 1 capsule (0.4 mg total) by mouth at bedtime. 30 capsule 0  . torsemide (DEMADEX) 20 MG tablet Take 1-2 tablets (20-40 mg total) by mouth as directed. Alternate every other day. 1 tablet (20 mg) daily and 2 tablets (40 mg) daily 180 tablet 3   No current facility-administered medications for this visit.     Functional Status:  In your present state of health, do you have any difficulty performing the following activities: 02/26/2019 12/20/2018  Hearing? N N  Vision? Y Y  Comment pt receives injection monthly due to retinopathy -  Difficulty concentrating or making decisions? N N  Walking or climbing stairs? Y Y  Comment patient has shortness of breath on exertion and wears O2 24 hr day -  Dressing or bathing? - N  Comment - -  Doing errands, shopping? - Y  [place] and eating ? - N  Using the Toilet? - N  In the past six months, have you accidently leaked urine? - N  Do you have problems with loss of bowel control? - N  Managing your Medications? - N  Managing your Finances? - N  Housekeeping or managing your Housekeeping? - N  Comment - -  Some recent data might be hidden    Fall/Depression Screening: Fall Risk  02/26/2019 12/20/2018 09/25/2018  Falls in the past year? '1 1 1  '$ Comment - lost balance and fell through wall; subsequent hospitalization -  Number falls in past yr: 0 0 0  Injury with Fall? '1 1 1  '$ Comment bruising - -  Risk  for fall due to : History of fall(s);Impaired balance/gait;Impaired mobility History of fall(s);Impaired balance/gait;Impaired mobility History of fall(s)  Follow up Falls evaluation completed;Education provided;Falls prevention discussed - Falls prevention discussed   PHQ 2/9 Scores 02/26/2019 12/20/2018 09/25/2018 12/21/2017 09/17/2017 02/27/2017 02/02/2017  PHQ - 2 Score 0 0 0 0 0 0 0  PHQ- 9 Score - 0 - - 0 - -   THN CM Care Plan Problem One     Most Recent Value  Care Plan Problem One  knowledge Deficit in Self Managment of Diabetes  Role  Documenting the Problem One  [place] for Problem One  Active  [facility] Long Term Goal   Patient will see a decrease in A1C from 7.7 within the next 90 days  THN Long Term Goal Start Date  02/26/19  Interventions for Problem One Long Term Goal  RN discussed what the patient A1C is. RN discussed what the range of the blood sugar 80-130 fasting to see a decrease of A1C less that 7. RN sent Living welll with diabetes booklet. RN will follow upwith further discussion  THN CM Short Term Goal #1   Patient will verbalize better food choices to pick when eating out within the next 90 days  THN CM Short Term Goal #1 Start Date  02/26/19  Interventions for Short Term Goal #1  RN discussed eating out and making healthy food choices. RN sent a list of diabetic dining out menu. RN will follow up with further discussion  THN CM Short Term Goal #2   Patient will verbalize chhosing high fiber foods to help with constipation within the next 30 days  THN CM Short Term Goal #2 Start Date  02/26/19  Interventions for Short Term Goal #2  RN discussed high fiber foods. RN sent educational material on a high fiber diet. RN will follow up with further discussion      Assessment:  A1C 7.7 Patient checks blood sugars up to 3 X day No symptoms of hyperglycemia Problems with constipation Patient checks feet daily Patient wears oxygen Patient has monthly eye injections due  to Diabetic retinopathy Patient wears CPAP at HS Patient uses safety precautions during pandemic Patient has adequate food and medication during pandemic Plan:  RN discussed making healthy diabetic choices dining out RN sent a list of diabetic dining out menu to choose from RN discussed A1C and Fasting blood sugar RN sent Living Well with Diabetes booklet RN sent  [place] on Adult constipation RN sent EMMI education on High Fiber diet RN sent EMMI education on Constipation RN will follow up within the month of November RN sent barriers letter to PCP  [facility] [phone]

## 2019-03-06 ENCOUNTER — Telehealth: Payer: Self-pay | Admitting: Cardiovascular Disease

## 2019-03-06 NOTE — Telephone Encounter (Signed)
Patient calling to check the status of results from a breathing test  Please call to discuss

## 2019-03-07 NOTE — Telephone Encounter (Signed)
Spoke with patient and reviewed that we do not have any pulmonary results and those would come from his pulmonary provider. Provided them with phone number to their office to call for those results. They were appreciative for the call and had no further questions at this time.

## 2019-03-13 DIAGNOSIS — E113213 Type 2 diabetes mellitus with mild nonproliferative diabetic retinopathy with macular edema, bilateral: Secondary | ICD-10-CM | POA: Diagnosis not present

## 2019-03-21 ENCOUNTER — Other Ambulatory Visit (INDEPENDENT_AMBULATORY_CARE_PROVIDER_SITE_OTHER): Payer: Medicare HMO

## 2019-03-21 DIAGNOSIS — E1122 Type 2 diabetes mellitus with diabetic chronic kidney disease: Secondary | ICD-10-CM | POA: Diagnosis not present

## 2019-03-21 DIAGNOSIS — N183 Chronic kidney disease, stage 3 (moderate): Secondary | ICD-10-CM

## 2019-03-21 DIAGNOSIS — Z794 Long term (current) use of insulin: Secondary | ICD-10-CM | POA: Diagnosis not present

## 2019-03-21 LAB — HEMOGLOBIN A1C: Hgb A1c MFr Bld: 7.6 % — ABNORMAL HIGH (ref 4.6–6.5)

## 2019-03-21 LAB — TSH: TSH: 7.59 u[IU]/mL — ABNORMAL HIGH (ref 0.35–4.50)

## 2019-03-22 IMAGING — CT CT CHEST WITHOUT CONTRAST
2 of 3 series · 14 of 36 positions shown, 17 images · non-contrast
Comparison: No priors.

CLINICAL DATA: 72-year-old male with history of trauma from a fall.
Injury to chest.

EXAM:
CT CHEST WITHOUT CONTRAST
TECHNIQUE: Multidetector CT imaging of the chest was performed following the
standard protocol without IV contrast.

[Series 2: thorax · axial · 0.79mm/px · z∈[-522,-262]mm · 11 of 154 slices shown, 14 images]
[im 12/154  mediastinal]
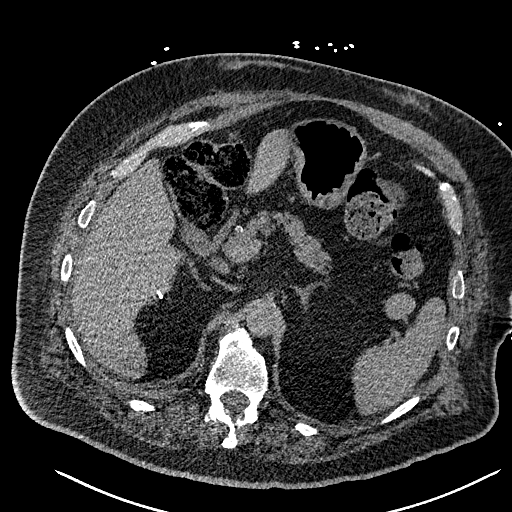
[im 12/154  lung]
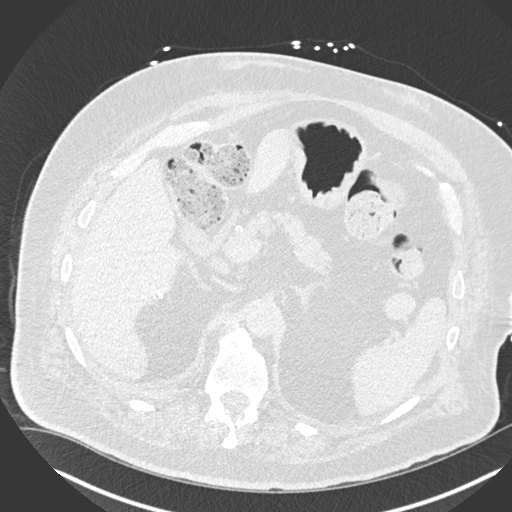
[im 23/154  lung]
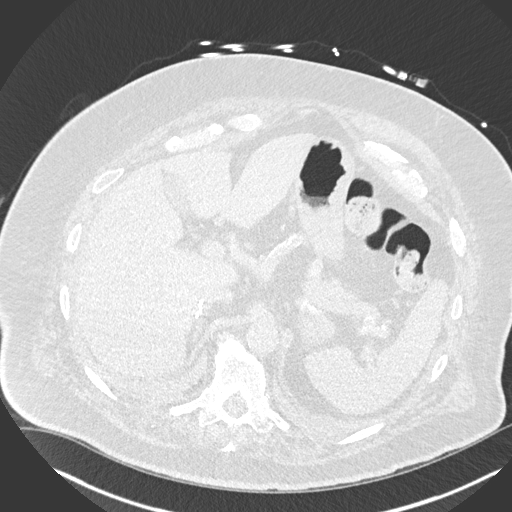
[im 35/154  lung]
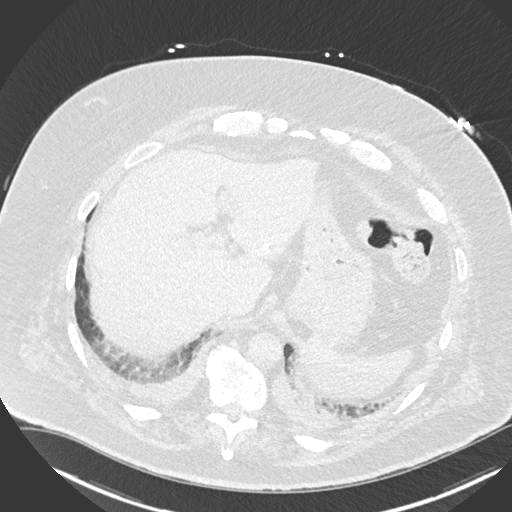
[im 52/154  lung]
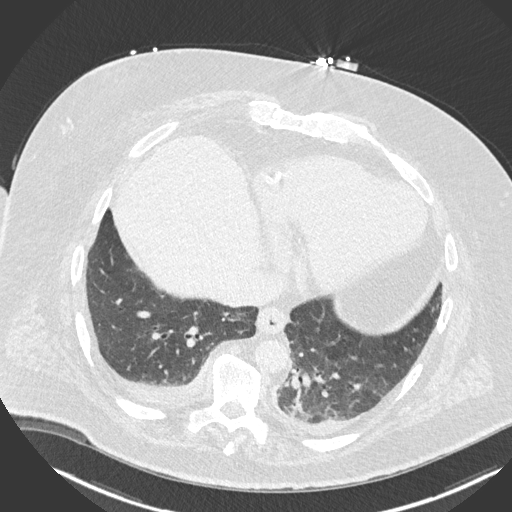
[im 63/154  mediastinal]
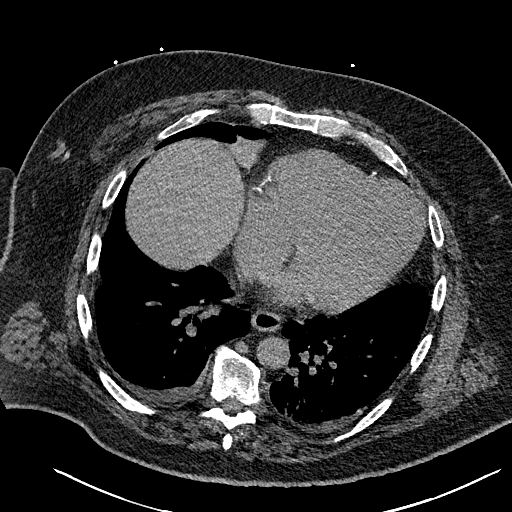
[im 63/154  lung]
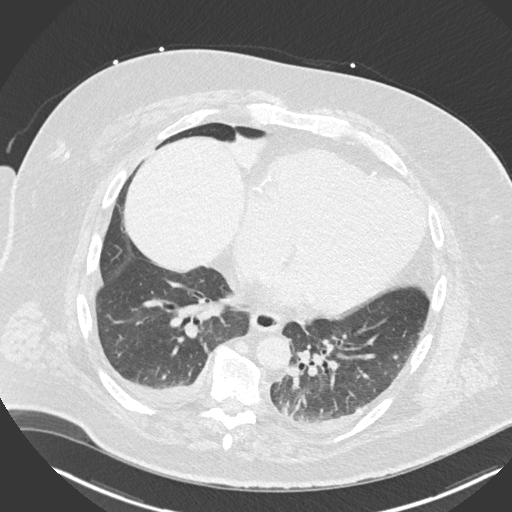
[im 80/154  lung]
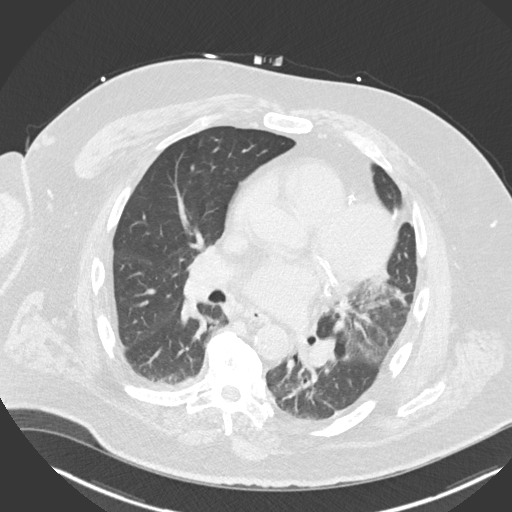
[im 91/154  lung]
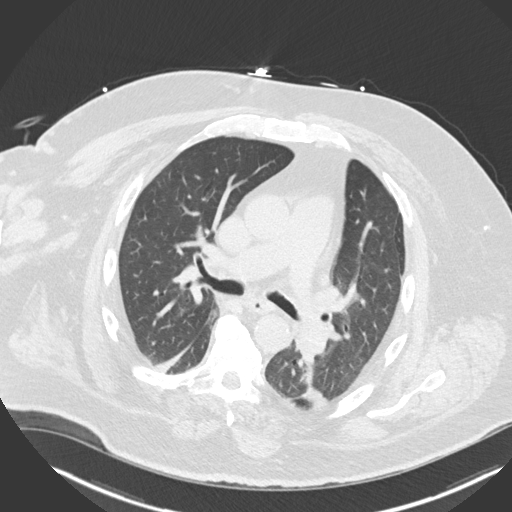
[im 103/154  lung]
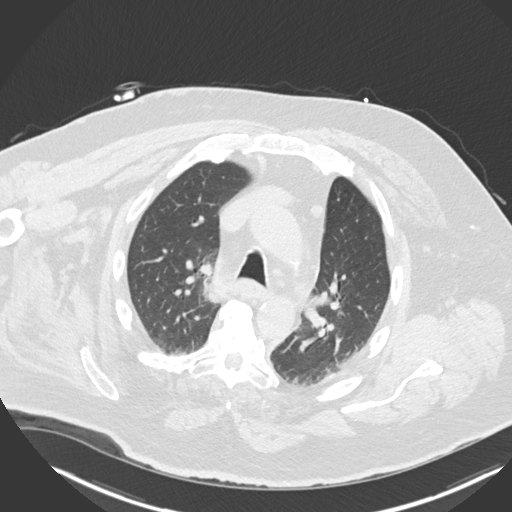
[im 120/154  mediastinal]
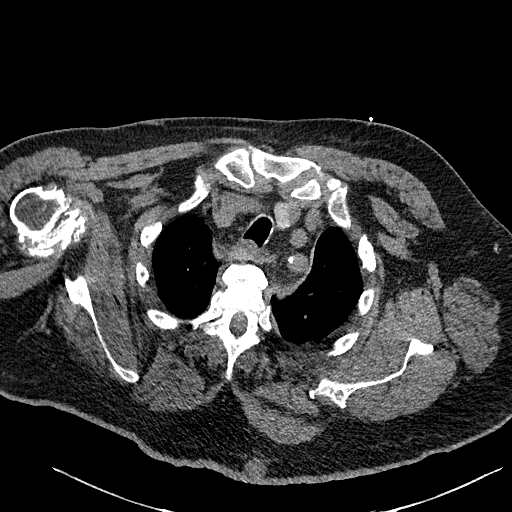
[im 120/154  lung]
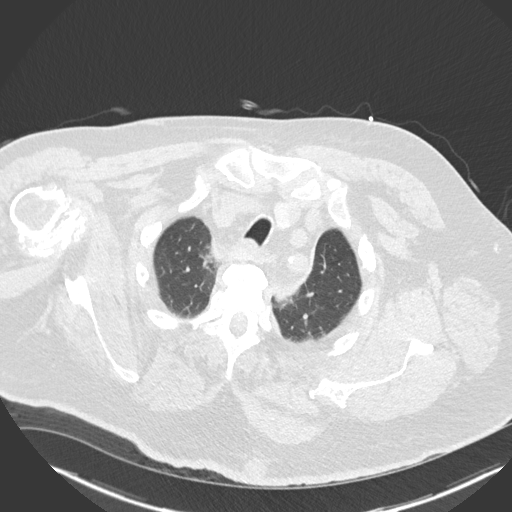
[im 131/154  lung]
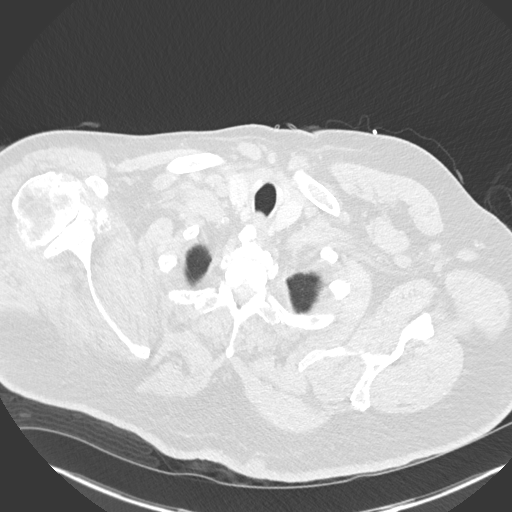
[im 142/154  lung]
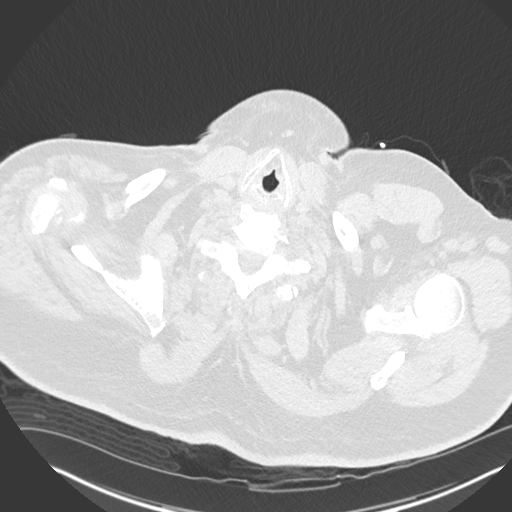

[Series 5: coronal · coronal · 0.63mm/px · 3 of 161 slices shown]
[im 33/161  lung]
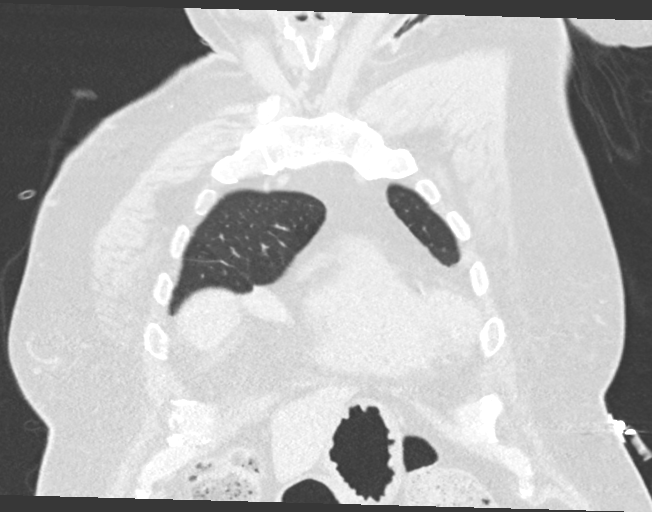
[im 65/161  lung]
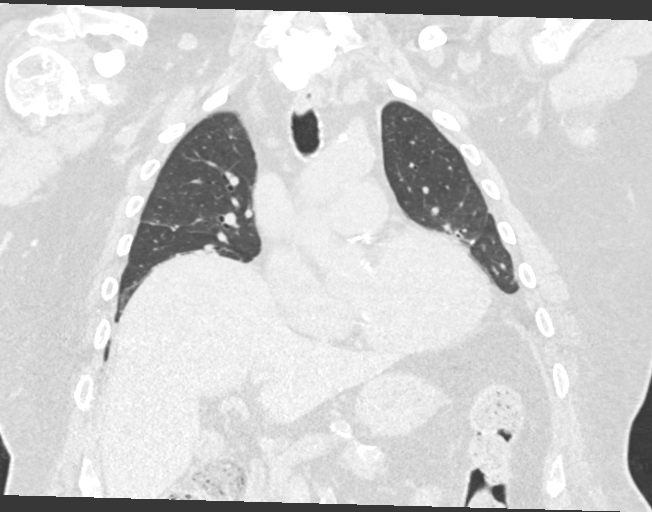
[im 97/161  lung]
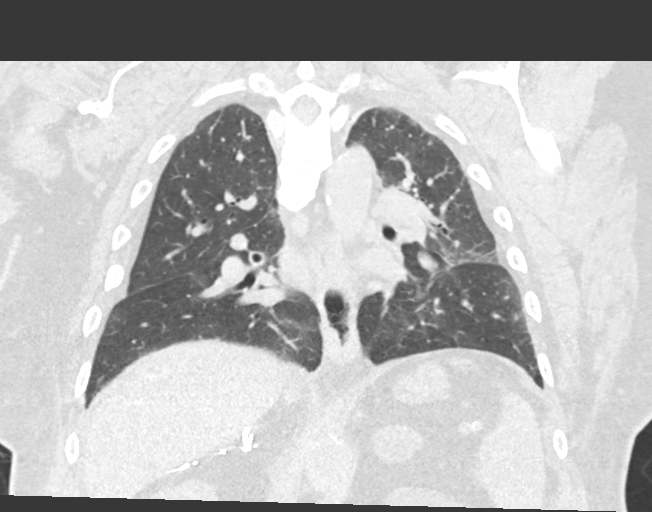

[14 of 36 positions shown; findings below may reference images not displayed]

FINDINGS: Cardiovascular: Heart size is mildly enlarged. There is no
significant pericardial fluid, thickening or pericardial
calcification. There is aortic atherosclerosis, as well as
atherosclerosis of the great vessels of the mediastinum and the
coronary arteries, including calcified atherosclerotic plaque in the
left main, left anterior descending, left circumflex and right
coronary arteries. Mild calcification of the aortic valve. Moderate
calcification of the mitral annulus.

Mediastinum/Nodes: No pathologically enlarged mediastinal or hilar
lymph nodes. Please note that accurate exclusion of hilar adenopathy
is limited on noncontrast CT scans. Esophagus is unremarkable in
appearance. No axillary lymphadenopathy.

Lungs/Pleura: No pneumothorax. Elevation of the right hemidiaphragm,
which appears similar to prior CT the abdomen and pelvis 11/23/2016.
Trace bilateral pleural effusions lying dependently. Areas of linear
scarring are noted throughout the lung bases bilaterally, similar to
the prior study. In addition, there are areas of ground-glass
attenuation, septal thickening, thickening of the
peribronchovascular interstitium and regional areas of architectural
distortion scattered throughout the lung bases bilaterally,
concerning for interstitial lung disease. No frank honeycombing
noted. No acute consolidative airspace disease. No pleural
effusions. No suspicious appearing pulmonary nodules or masses are
noted.

Upper Abdomen: Well-defined 3.2 x 3.2 cm low-attenuation (8 HU) left
adrenal nodule, similar to the prior examination, compatible with an
adenoma. Surgical clips in the upper right retroperitoneum.

Musculoskeletal: There are no aggressive appearing lytic or blastic
lesions noted in the visualized portions of the skeleton.
IMPRESSION: 1. No evidence of significant acute traumatic injury to the thorax.
2. There is a spectrum of findings in the lungs concerning for
interstitial lung disease, considered indeterminate for usual
interstitial pneumonia (UIP) per current ATS guidelines. Outpatient
referral to Pulmonology for further evaluation is suggested in the
near future. Additionally, follow-up high-resolution chest CT is
recommended in 12 months to assess for temporal changes in the
appearance of the lung parenchyma.
3. Trace bilateral pleural effusions lying dependently.
4. Aortic atherosclerosis, in addition to left main and 3 vessel
coronary artery disease. Please note that although the presence of
coronary artery calcium documents the presence of coronary artery
disease, the severity of this disease and any potential stenosis
cannot be assessed on this non-gated CT examination. Assessment for
potential risk factor modification, dietary therapy or pharmacologic
therapy may be warranted, if clinically indicated.
5. There are calcifications of the aortic valve and mitral annulus.
Echocardiographic correlation for evaluation of potential valvular
dysfunction may be warranted if clinically indicated.
6. Mild cardiomegaly.

Aortic Atherosclerosis ([id number]-EXW.W).

## 2019-03-27 ENCOUNTER — Ambulatory Visit (INDEPENDENT_AMBULATORY_CARE_PROVIDER_SITE_OTHER): Payer: Medicare HMO | Admitting: Family Medicine

## 2019-03-27 ENCOUNTER — Encounter: Payer: Self-pay | Admitting: Family Medicine

## 2019-03-27 ENCOUNTER — Other Ambulatory Visit: Payer: Self-pay

## 2019-03-27 VITALS — BP 124/56 | HR 62 | Temp 97.7°F | Ht 65.0 in | Wt 274.4 lb

## 2019-03-27 DIAGNOSIS — Z794 Long term (current) use of insulin: Secondary | ICD-10-CM

## 2019-03-27 DIAGNOSIS — Z23 Encounter for immunization: Secondary | ICD-10-CM

## 2019-03-27 DIAGNOSIS — N183 Chronic kidney disease, stage 3 unspecified: Secondary | ICD-10-CM

## 2019-03-27 DIAGNOSIS — E039 Hypothyroidism, unspecified: Secondary | ICD-10-CM

## 2019-03-27 DIAGNOSIS — G8929 Other chronic pain: Secondary | ICD-10-CM

## 2019-03-27 DIAGNOSIS — E1122 Type 2 diabetes mellitus with diabetic chronic kidney disease: Secondary | ICD-10-CM

## 2019-03-27 DIAGNOSIS — J45909 Unspecified asthma, uncomplicated: Secondary | ICD-10-CM | POA: Diagnosis not present

## 2019-03-27 DIAGNOSIS — R0602 Shortness of breath: Secondary | ICD-10-CM | POA: Diagnosis not present

## 2019-03-27 MED ORDER — TAMSULOSIN HCL 0.4 MG PO CAPS: 0.4000 mg | ORAL_CAPSULE | Freq: Every day | ORAL | 3 refills | Status: DC

## 2019-03-27 MED ORDER — NITROGLYCERIN 0.4 MG SL SUBL: 0.4000 mg | SUBLINGUAL_TABLET | SUBLINGUAL | 99 refills | Status: AC | PRN

## 2019-03-27 MED ORDER — CARVEDILOL 12.5 MG PO TABS: 12.5000 mg | ORAL_TABLET | Freq: Two times a day (BID) | ORAL | 3 refills | Status: DC

## 2019-03-27 MED ORDER — OXYCODONE HCL 15 MG PO TABS: 15.0000 mg | ORAL_TABLET | Freq: Every day | ORAL | 0 refills | Status: DC | PRN

## 2019-03-27 MED ORDER — LEVOTHYROXINE SODIUM 50 MCG PO TABS: 50.0000 ug | ORAL_TABLET | Freq: Every day | ORAL | 3 refills | Status: DC

## 2019-03-27 NOTE — Progress Notes (Signed)
He has been wheezing, using SABA prn but more than his typical baseline.  Noted the change in the last month.  Still on O2 at baseline.  Usually he needs SABA rarely, a few times a month.  He has pulmonary follow-up pending.  Diabetes:  Using medications without difficulties: yes Hypoglycemic episodes: rare, after prolonged fasting.   Hyperglycemic episodes:no Feet problems:no Blood Sugars averaging: 70-125s in the AM A1c reasonable, d/w pt.    He was supposed to be taking 12.5 mg carvedilol, has been doubling up on 6.25mg  tabs.  Med list updated.    He needed refill on flomax.   Refills done at [facility].   Hypothyroidism.  On replacement but TSH still up.  Compliant.  Labs d/w pt.    He has used oxycodone prn for leg pain.  Leg and back usually pain is still worse at night.  Has used oxycodone as needed.  No adverse effect of medication other than some tendency towards constipation.   Back pain some better with oxycodone with bowel regimen.    PMH and SH reviewed  ROS: Per HPI unless specifically indicated in ROS section   Meds, vitals, and allergies reviewed.   GEN: nad, alert and oriented, overweight, on O2 at baseline.  HEENT: ncat NECK: supple w/o LA CV: rrr. PULM: ctab, no inc wob ABD: soft, +bs EXT: 1+ BLE edema SKIN: no acute rash

## 2019-03-27 NOTE — Patient Instructions (Addendum)
Increase to 75mcg of thyroid medicine.  Recheck labs in about 3 months.  Keep using the inhaler as needed and I'll check on the pulmonary appointment.  Take care.  Glad to see you.

## 2019-03-28 ENCOUNTER — Other Ambulatory Visit: Payer: Medicare HMO

## 2019-03-28 ENCOUNTER — Ambulatory Visit (INDEPENDENT_AMBULATORY_CARE_PROVIDER_SITE_OTHER): Payer: Medicare HMO | Admitting: Pulmonary Disease

## 2019-03-28 ENCOUNTER — Encounter: Payer: Self-pay | Admitting: Pulmonary Disease

## 2019-03-28 VITALS — BP 140/80 | HR 58 | Temp 98.4°F | Ht 65.0 in | Wt 271.4 lb

## 2019-03-28 DIAGNOSIS — N183 Chronic kidney disease, stage 3 unspecified: Secondary | ICD-10-CM

## 2019-03-28 DIAGNOSIS — I272 Pulmonary hypertension, unspecified: Secondary | ICD-10-CM | POA: Diagnosis not present

## 2019-03-28 DIAGNOSIS — G4733 Obstructive sleep apnea (adult) (pediatric): Secondary | ICD-10-CM | POA: Diagnosis not present

## 2019-03-28 DIAGNOSIS — J4540 Moderate persistent asthma, uncomplicated: Secondary | ICD-10-CM

## 2019-03-28 DIAGNOSIS — J9611 Chronic respiratory failure with hypoxia: Secondary | ICD-10-CM | POA: Diagnosis not present

## 2019-03-28 DIAGNOSIS — J449 Chronic obstructive pulmonary disease, unspecified: Secondary | ICD-10-CM | POA: Diagnosis not present

## 2019-03-28 DIAGNOSIS — E662 Morbid (severe) obesity with alveolar hypoventilation: Secondary | ICD-10-CM

## 2019-03-28 DIAGNOSIS — Z9989 Dependence on other enabling machines and devices: Secondary | ICD-10-CM

## 2019-03-28 DIAGNOSIS — I5032 Chronic diastolic (congestive) heart failure: Secondary | ICD-10-CM | POA: Diagnosis not present

## 2019-03-28 DIAGNOSIS — J849 Interstitial pulmonary disease, unspecified: Secondary | ICD-10-CM | POA: Diagnosis not present

## 2019-03-28 MED ORDER — BREO ELLIPTA 100-25 MCG/INH IN AEPB: 1.0000 | INHALATION_SPRAY | Freq: Every day | RESPIRATORY_TRACT | 11 refills | Status: DC

## 2019-03-28 MED ORDER — BREO ELLIPTA 100-25 MCG/INH IN AEPB: 1.0000 | INHALATION_SPRAY | Freq: Every day | RESPIRATORY_TRACT | 0 refills | Status: DC

## 2019-03-28 NOTE — Progress Notes (Signed)
Subjective:    Patient ID: Stephen [name], male    DOB: 1945/11/01, 73 y.o.   MRN: [id number]  HPI The patient is a 73 year old lifelong never smoker, who has a very complex history as delineated below.  He had previously been followed by Dr. Baltazar Apo and then subsequently by Dr. Roselie Awkward.  He last was seen by pulmonary medicine in 2014.  He presents today to reestablish care with pulmonary.  He follows with cardiology with Dr. Esmond Plants and most recently was seen via virtual visit in July by Dr. Pierre Bali in the advanced heart failure clinic.  His current medical problems are as noted below:  1.  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 2.  Negative connective tissue disease work-up May 2010 at [facility] by Dr. Thom Chimes. 3.  Chronic hypoxemic respiratory failure due to the above.  On 5 L/min nasal cannula O2. 4.  Labeled as COPD however this is an erroneous diagnosis see below. 5.  Obstructive sleep apnea on CPAP the patient believes at 16 cm H2O. 6.  Coronary artery disease status post PTCI 7.  Chronic diastolic heart failure,left ventricular ejection fraction 45 to 50% 3/20 8.  Pulmonary hypertension with elevated left ventricular end-diastolic pressure 9.  CKD III 10.  Diabetes mellitus type 2 11.  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  BMI 45  The patient had pulmonary function testing performed on 17 February 2019 these were ordered by Dr. Pierre Bali.  The patient was noted to have an FEV1 of 1.92 L or 68% predicted postbronchodilator FEV1 was 2.18 L or 83% of predicted for an net change of 21% which is consistent with asthma.  His postbronchodilator FEV1 was stable from FEV1 noted on PFTs in May 2010.  The PFTs also showed relatively normal lung volumes and decreased ERV consistent with obesity.  His diffusion capacity was at the lower limits of normal at 84%.    The patient is not on any maintenance medications except for as needed albuterol.  He notes that albuterol does help his breathing when he feels his chest is tight and is wheezing.  Previously he had described that he would get chest tightness and wheezing when he would mow his grass.  He no longer does this but this used to be consistent problem.  Currently he notes that he has to use albuterol at least 2-3 times a day.  He was admitted in March 2020 with acute respiratory distress with oxygen saturations in the 80% range on room air.  He had an episode of syncope related to CHF exacerbation and was noted to have an upper respiratory infection as well.  Since then he has been on oxygen as high as 5 L/min.  He has been diagnosed with pulmonary hypertension.  He has been placed on Revatio but is not sure if this medication is helping him at all.  This apparently was started during his admission in March.  He had a CT scan of the chest to rule out PE during that admission that showed some groundglass opacities and mild scarring consistent with ILD.  The patient does not present with any complaint today except that of dyspnea which has been an ongoing issue.  As noted his pulmonary function testing is highly suggestive of asthma.  The patient almost normalizes his FEV1 with bronchodilator.  I have reviewed the pathology report from his original  VATS and the differential diagnosis at that time was either metal dust ILD (pneumoconiosis) versus hypersensitivity pneumonitis.  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  This CT images not consistent with metal dust ILD.  The patient is also adamant that he has never smoked in his life.  Given the findings on PFTs this patient does not have COPD.   Review of Systems  Constitutional: Positive for fatigue and unexpected weight change (Weight gain).   HENT: Negative.   Eyes: Negative.   Respiratory: Positive for apnea, chest tightness, shortness of breath and wheezing.   Cardiovascular: Positive for leg swelling.  Gastrointestinal: Negative.        Does not endorse gastroesophageal reflux symptoms.  Endocrine: Negative.   Genitourinary: Negative.   Musculoskeletal: Positive for arthralgias.  Skin: Negative.   Allergic/Immunologic: Negative.   Neurological: Positive for weakness.  Hematological: Negative.   Psychiatric/Behavioral: Negative.   All other systems reviewed and are negative.      Objective:   Physical Exam Vitals signs and nursing note reviewed.  Constitutional:      Appearance: He is morbidly obese. He is ill-appearing (Chronically ill-appearing).  HENT:     Head: Normocephalic and atraumatic.     Right Ear: External ear normal.     Left Ear: External ear normal.     Nose:     Comments: Nose/mouth/throat not examined due to masking requirements for COVID 19. Eyes:     General: No scleral icterus.    Conjunctiva/sclera: Conjunctivae normal.     Pupils: Pupils are equal, round, and reactive to light.  Neck:     Musculoskeletal: Neck supple.     Thyroid: No thyromegaly.     Vascular: No JVD.     Trachea: Trachea and phonation normal.  Cardiovascular:     Rate and Rhythm: Regular rhythm. Bradycardia present.     Pulses: Normal pulses.     Heart sounds: Normal heart sounds. No murmur.  Pulmonary:     Effort: Pulmonary effort is normal.     Breath sounds: Examination of the right-lower field reveals decreased breath sounds. Decreased breath sounds present. No wheezing or rhonchi.     Comments: Coarse breath sounds throughout.  Known right hemidiaphragm elevation. Abdominal:     General: Abdomen is protuberant. There is no distension.     Palpations: Abdomen is soft.  Musculoskeletal:     Right lower leg: 2+ Edema present.     Left lower leg: 2+ Edema present.  Lymphadenopathy:     Cervical: No cervical  adenopathy.  Skin:    General: Skin is warm and dry.  Neurological:     General: No focal deficit present.     Mental Status: He is alert and oriented to person, place, and time.  Psychiatric:        Mood and Affect: Mood normal.        Behavior: Behavior normal.     Representative images CT scan 3/20.  Independently reviewed   PFTs 2010 [facility]:                                    PRE                POST  Ref        Best     %Pred       Best     %Pred    %Chg  FVC        Liters       3.97       3.52       89  FEV1       Liters      3.18       2.60       82  FEV1/FVC   %           80         74  FEF25-75%  L/sec       3.22       2.33     72  PEF        L/sec       7.42       3.80     51  MVV        L/min       123        81       66  DLCO       mL/mmHg/min 25.6       19.6     77  VA         Liters                 4.30  IVC        Liters                 3.07  BHT        Sec                    10.08    Assessment & Plan:   1.  Moderate persistent asthma: The patient needs maintenance medication.  We will give him a trial of Brio Ellipta 100/25, 1 inhalation daily.  He can continue using albuterol as needed but I have explained to him that I think that the [employer] will likely decrease the need for this.  He also was encouraged to lose weight as obesity is pro inflammatory and worsens asthma.  2.  Interstitial lung disease: Initially this was believed to be due to metal dust exposure, however after the patient left I was able to examine all of his old records which are extensive as well as pathology reports from 2009 etc. 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  The degree of impairment by pulmonary function testing is mild to moderate.  3.  Chronic respiratory failure with hypoxia: The patient has not had titration of his oxygen since he was discharged from the hospital in March.  An  ambulatory oximetry was performed today and the patient actually does very well on 1 to 2 L/min via nasal cannula.  I suspect that he will continue to do better after his asthma is under better control.  He does not have significant diffusion capacity impairment on PFT to explain hypoxemia.  This may be related to obesity with obesity hypoventilation and possibly also to pulmonary hypertension.  He was instructed to decrease his oxygen flow to 2 L/min, we will also review his oxygen utilization with his CPAP equipment.  4.  Pulmonary hypertension: This may be a combination of pre-and post capillary pulmonary hypertension.  He really should undergo right heart cath to better determine this.  This is particularly important since he is on a vasodilator which may not be the best medication for him and may actually be aggravating his issues with hypoxemia by increasing VQ mismatch.  5.  Obstructive sleep apnea on CPAP and oxygen bled in: Will obtain overnight oximetry with a decrease of oxygen to 2 L/min.  The patient ideally should undergo titration study as I suspect that he would benefit from either BiPAP or AVAPS given his obesity.  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  He has however reluctant to undergo further study at present.  6.  Chronic diastolic heart failure: This issue adds complexity to his management and may be contributing to his pulmonary hypertension.  7.  Chronic kidney disease stage III: This issue adds complexity to his management, continue follow-up with Dr. Holley Raring.  8.  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   Patient is up-to-date on his flu vaccine.  Total time of visit was 60 minutes.  This chart was dictated using voice recognition software/Dragon.  Despite best efforts to proofread, errors can occur which can change the meaning.  Any change was purely unintentional.

## 2019-03-28 NOTE — Patient Instructions (Signed)
1.  You have Interstitial Lung Disease or Pulmonary Fibrosis but it does not appear to have worsened.  This was likely due to exposure to dust and metal dust in the past.  Usually this is not progressive.  2.  Your breathing tests also show that you have ASTHMA please note that this is not COPD as you have never smoked.  The degree of the asthma is mild to moderate but it needs to be controlled.  Will start Breo Ellipta 100/25, 1 inhalation daily.  Rinse your mouth well after use.  3.  You may still use your albuterol inhaler as needed.  You may use it up to 4 times a day.  Hopefully the Breo will help decrease the number of times you have to use it during the day.  4.  Weight loss is recommended as your breathing tests show that this is hindering your breathing.  5.  I am going to discuss your pulmonary hypertension with Dr. Rockey Situ.  I recommend that you have a right heart catheterization.  6.  With regards to your sleep apnea you should have this rechecked, we will address this after your right heart catheterization.  7.  We have ordered a oxygen study for nighttime.  8.  Your oxygen needs have decreased.  When you were checked today you only need 2 L/min to maintain adequate oxygen levels.  9.  We will see you in follow-up in 2 months time.  Call sooner should any new problems arise.

## 2019-03-31 DIAGNOSIS — J45909 Unspecified asthma, uncomplicated: Secondary | ICD-10-CM | POA: Insufficient documentation

## 2019-03-31 NOTE — Assessment & Plan Note (Signed)
He has used oxycodone prn for leg pain.  Leg and back usually pain is still worse at night.  Has used oxycodone as needed.  No adverse effect of medication other than some tendency towards constipation.   Back pain some better with oxycodone with bowel regimen.    Would continue as is.

## 2019-03-31 NOTE — Assessment & Plan Note (Signed)
Blood Sugars averaging: 70-125s in the AM A1c reasonable, d/w pt.   no change in meds at this point.

## 2019-03-31 NOTE — Assessment & Plan Note (Signed)
On replacement but TSH still up.  Compliant.  Labs d/w pt.  Increase to 59mcg of levothyroxine.  Recheck labs in about 3 months.

## 2019-03-31 NOTE — Assessment & Plan Note (Addendum)
He has been wheezing, using SABA prn but more than his typical baseline.  Noted the change in the last month.  Still on O2 at baseline.  Usually he needs SABA rarely, a few times a month.  He has pulmonary follow-up pending.  I will await the consult note.  Lungs are clear at time of exam. >25 minutes spent in face to face time with patient, >50% spent in counselling or coordination of care.

## 2019-04-02 DIAGNOSIS — E113213 Type 2 diabetes mellitus with mild nonproliferative diabetic retinopathy with macular edema, bilateral: Secondary | ICD-10-CM | POA: Diagnosis not present

## 2019-04-11 ENCOUNTER — Other Ambulatory Visit: Payer: Self-pay | Admitting: Family Medicine

## 2019-04-16 DIAGNOSIS — J849 Interstitial pulmonary disease, unspecified: Secondary | ICD-10-CM | POA: Diagnosis not present

## 2019-04-17 ENCOUNTER — Telehealth: Payer: Self-pay | Admitting: Family Medicine

## 2019-04-17 DIAGNOSIS — E113213 Type 2 diabetes mellitus with mild nonproliferative diabetic retinopathy with macular edema, bilateral: Secondary | ICD-10-CM | POA: Diagnosis not present

## 2019-04-17 NOTE — Telephone Encounter (Signed)
For charting purposes about imdur and sildenafil-  I checked with Dr. Rockey Situ.  He was started on the sildenafil by pulmonary hypertension clinic in [facility] to stay on both.

## 2019-04-25 ENCOUNTER — Telehealth: Payer: Self-pay | Admitting: Pulmonary Disease

## 2019-04-25 NOTE — Telephone Encounter (Signed)
ONO on 2L and cpap has been reviewed by LG. Pt should continue oxygen at 2L with cpap.  ATC pt to make aware unable to leave vm, due to mailbox not being setup.  Will call back.

## 2019-04-27 DIAGNOSIS — J449 Chronic obstructive pulmonary disease, unspecified: Secondary | ICD-10-CM | POA: Diagnosis not present

## 2019-04-28 NOTE — Telephone Encounter (Signed)
Call returned to patient, confirmed DOB, made aware of recommendations from Dr. Darnell Level. Voiced understanding. Nothing further needed at this time.

## 2019-04-28 NOTE — Telephone Encounter (Signed)
Patient is returning phone call.  Patient phone number is [phone].

## 2019-05-14 DIAGNOSIS — E113213 Type 2 diabetes mellitus with mild nonproliferative diabetic retinopathy with macular edema, bilateral: Secondary | ICD-10-CM | POA: Diagnosis not present

## 2019-05-19 DIAGNOSIS — N2581 Secondary hyperparathyroidism of renal origin: Secondary | ICD-10-CM | POA: Diagnosis not present

## 2019-05-19 DIAGNOSIS — I10 Essential (primary) hypertension: Secondary | ICD-10-CM | POA: Diagnosis not present

## 2019-05-19 DIAGNOSIS — R600 Localized edema: Secondary | ICD-10-CM | POA: Diagnosis not present

## 2019-05-19 DIAGNOSIS — E1122 Type 2 diabetes mellitus with diabetic chronic kidney disease: Secondary | ICD-10-CM | POA: Diagnosis not present

## 2019-05-19 DIAGNOSIS — N1832 Chronic kidney disease, stage 3b: Secondary | ICD-10-CM | POA: Diagnosis not present

## 2019-05-20 ENCOUNTER — Other Ambulatory Visit: Payer: Self-pay

## 2019-05-20 NOTE — Patient Outreach (Signed)
[facility]) Care Management  05/20/2019  DALESSANDRO BALDYGA 1946/04/10 [id number]   Telephone Assessment    Outreach attempt #1 to patient. No answer at present and voicemail not set up.      Plan: RN CM will make outreach attempt to patient in the month of December.   Enzo Montgomery, RN,BSN,CCM [facility] Telephonic Care Management Coordinator Direct Phone: [phone] Toll Free: [phone] Fax: [phone]

## 2019-05-22 ENCOUNTER — Ambulatory Visit: Payer: Medicare HMO | Admitting: *Deleted

## 2019-05-22 ENCOUNTER — Ambulatory Visit: Payer: Medicare HMO

## 2019-05-26 ENCOUNTER — Other Ambulatory Visit: Payer: Self-pay | Admitting: Cardiovascular Disease

## 2019-05-28 DIAGNOSIS — J449 Chronic obstructive pulmonary disease, unspecified: Secondary | ICD-10-CM | POA: Diagnosis not present

## 2019-05-28 DIAGNOSIS — H02831 Dermatochalasis of right upper eyelid: Secondary | ICD-10-CM | POA: Diagnosis not present

## 2019-05-28 DIAGNOSIS — H40042 Steroid responder, left eye: Secondary | ICD-10-CM | POA: Diagnosis not present

## 2019-05-28 DIAGNOSIS — H02834 Dermatochalasis of left upper eyelid: Secondary | ICD-10-CM | POA: Diagnosis not present

## 2019-06-05 ENCOUNTER — Other Ambulatory Visit: Payer: Self-pay

## 2019-06-05 ENCOUNTER — Encounter: Payer: Self-pay | Admitting: Pulmonary Disease

## 2019-06-05 ENCOUNTER — Ambulatory Visit: Payer: Medicare HMO | Admitting: Pulmonary Disease

## 2019-06-05 VITALS — BP 130/80 | HR 64 | Temp 98.4°F | Ht 65.0 in | Wt 277.0 lb

## 2019-06-05 DIAGNOSIS — J9611 Chronic respiratory failure with hypoxia: Secondary | ICD-10-CM

## 2019-06-05 DIAGNOSIS — I272 Pulmonary hypertension, unspecified: Secondary | ICD-10-CM

## 2019-06-05 DIAGNOSIS — G4733 Obstructive sleep apnea (adult) (pediatric): Secondary | ICD-10-CM

## 2019-06-05 DIAGNOSIS — I5032 Chronic diastolic (congestive) heart failure: Secondary | ICD-10-CM

## 2019-06-05 DIAGNOSIS — J849 Interstitial pulmonary disease, unspecified: Secondary | ICD-10-CM

## 2019-06-05 DIAGNOSIS — E662 Morbid (severe) obesity with alveolar hypoventilation: Secondary | ICD-10-CM

## 2019-06-05 DIAGNOSIS — J4540 Moderate persistent asthma, uncomplicated: Secondary | ICD-10-CM

## 2019-06-05 NOTE — Patient Instructions (Signed)
1.  Continue Breo Ellipta for now.  2.  See you in follow-up in 4 months time you will see me or the nurse practitioner.

## 2019-06-18 ENCOUNTER — Other Ambulatory Visit: Payer: Self-pay

## 2019-06-18 NOTE — Patient Outreach (Signed)
[facility]) Care Management  06/18/2019  Stephen [name] [birth year] [id number]   Telephone Assessment   Outreach attempt #2 to patient. No answer after several rings and unable to leave message.    Plan: RN CM will make outreach attempt to patient within the month of January. RN CM will send unsuccessful outreach letter to patient.   Enzo Montgomery, RN,BSN,CCM [facility] Telephonic Care Management Coordinator Direct Phone: [phone] Toll Free: [phone] Fax: [phone]

## 2019-06-20 ENCOUNTER — Ambulatory Visit: Payer: Medicare HMO

## 2019-06-24 ENCOUNTER — Ambulatory Visit: Payer: Medicare HMO | Admitting: Pulmonary Disease

## 2019-06-24 IMAGING — US BILATERAL CAROTID DUPLEX ULTRASOUND
1 series · 14 of 24 positions shown · non-contrast
Comparison: None.

CLINICAL DATA: CVA

EXAM:
BILATERAL CAROTID DUPLEX ULTRASOUND
TECHNIQUE: Gray scale imaging, color Doppler and duplex ultrasound were
performed of bilateral carotid and vertebral arteries in the neck.

[Series 1: bilateral carotid duplex ultrasound · 0.07mm/px · 14 of 63 slices shown]
[im 1/63]
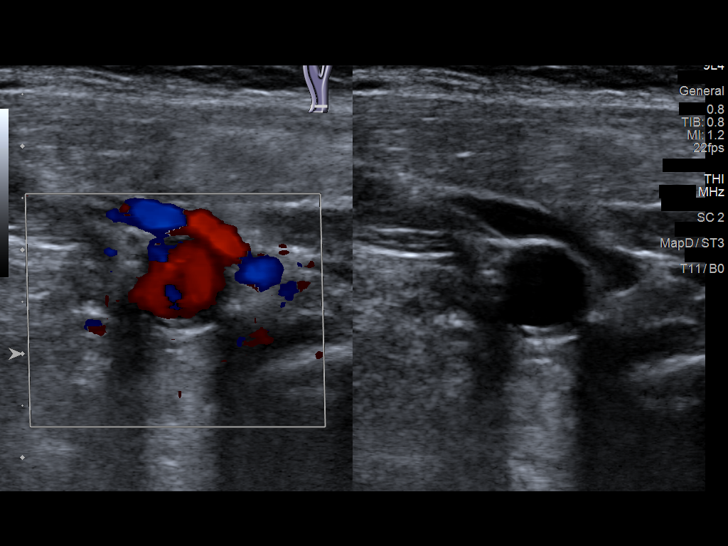
[im 6/63]
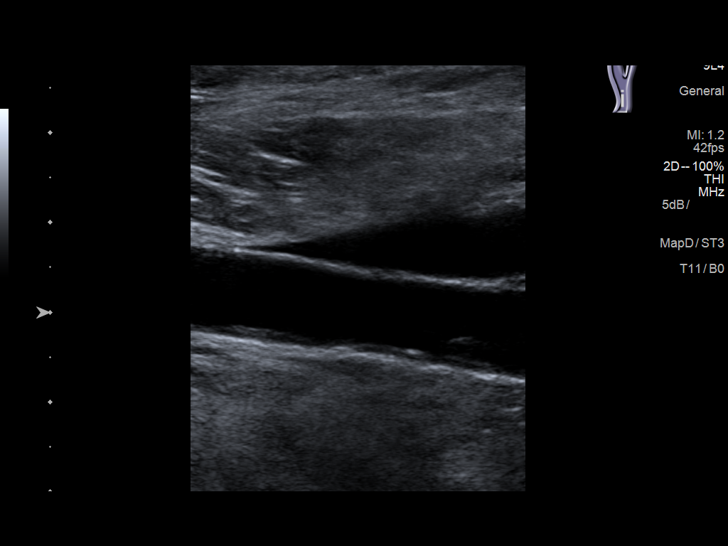
[im 11/63]
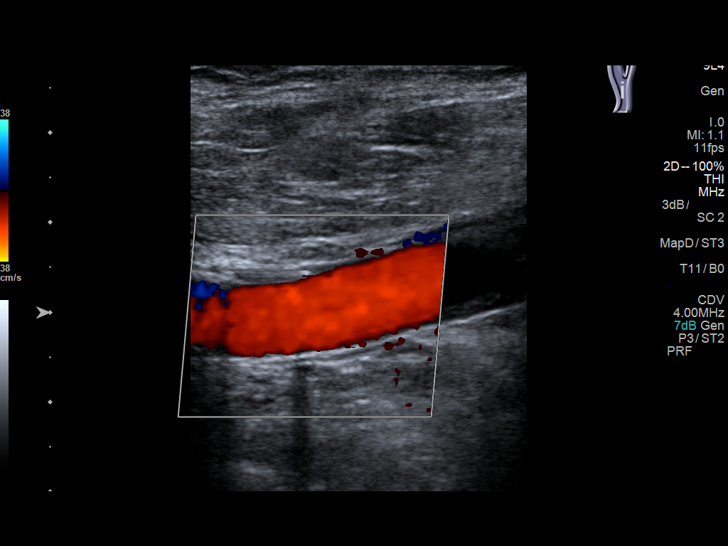
[im 17/63]
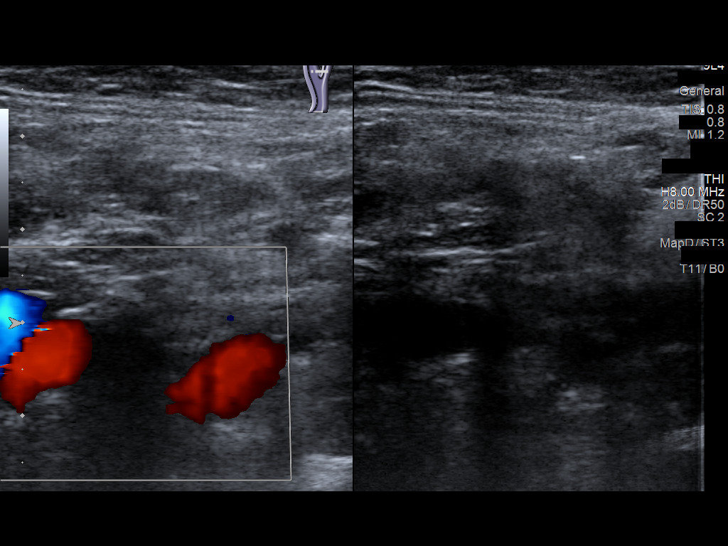
[im 19/63]
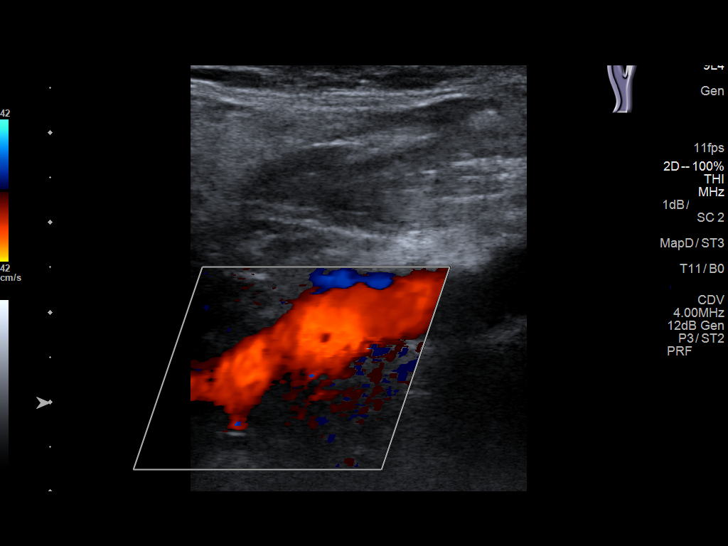
[im 25/63]
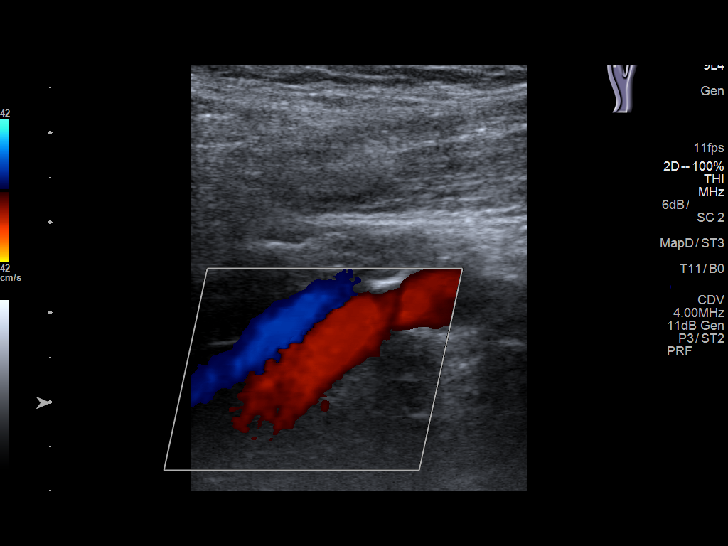
[im 30/63]
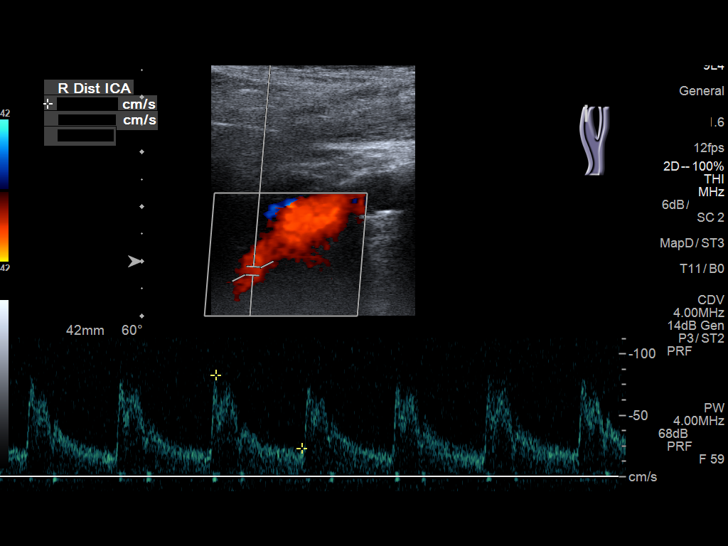
[im 33/63]
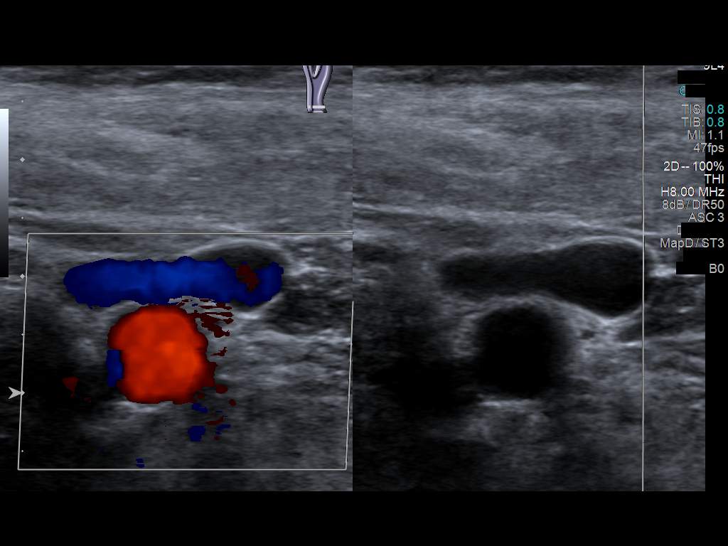
[im 38/63]
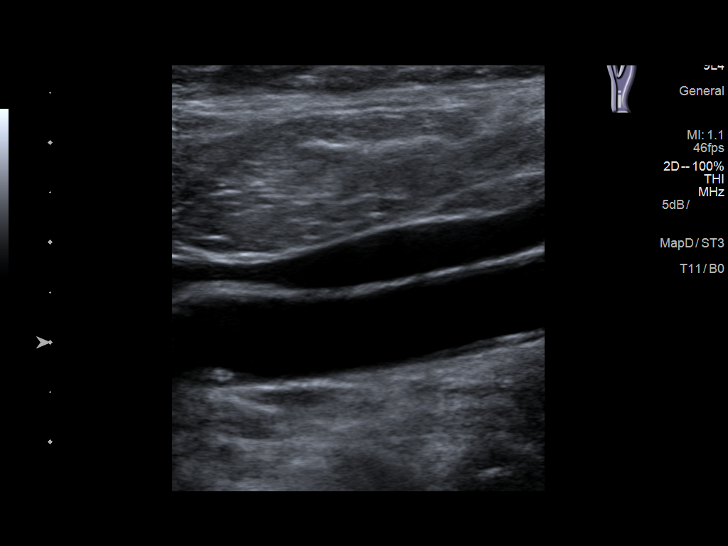
[im 44/63]
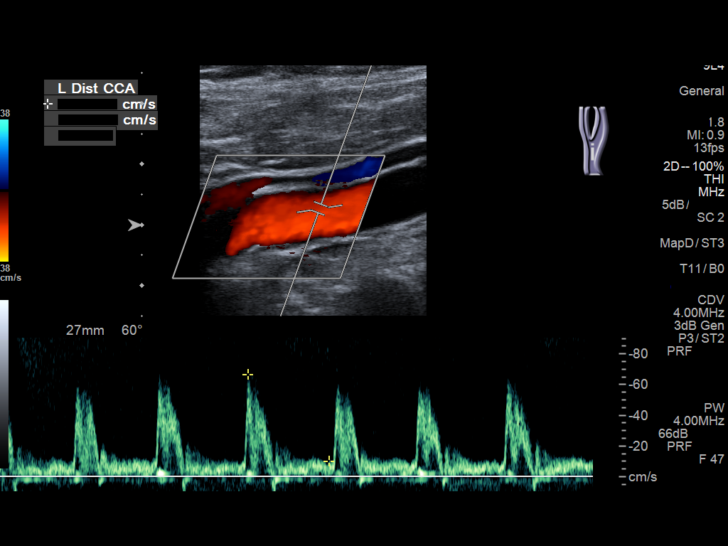
[im 49/63]
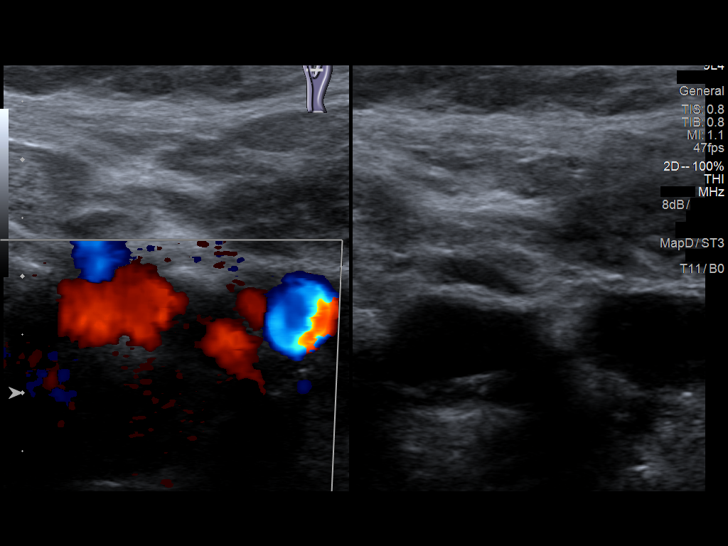
[im 52/63]
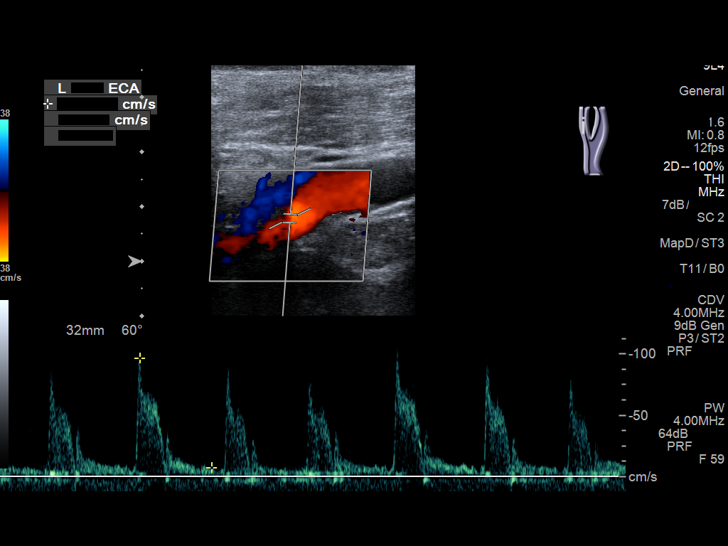
[im 57/63]
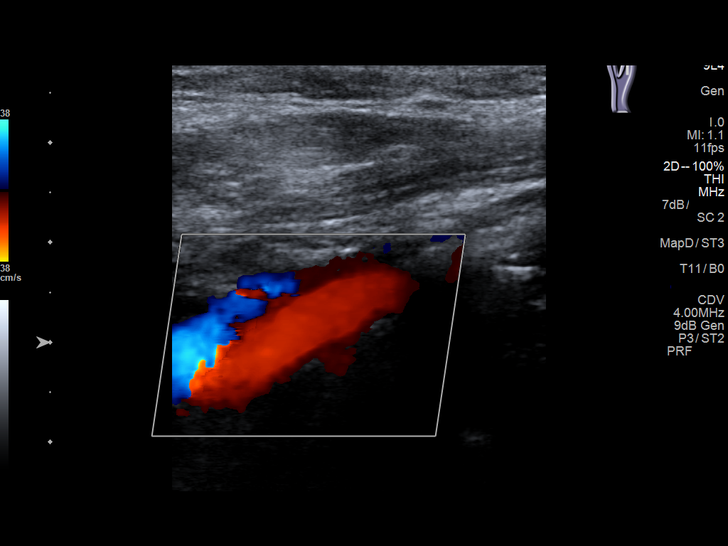
[im 63/63]
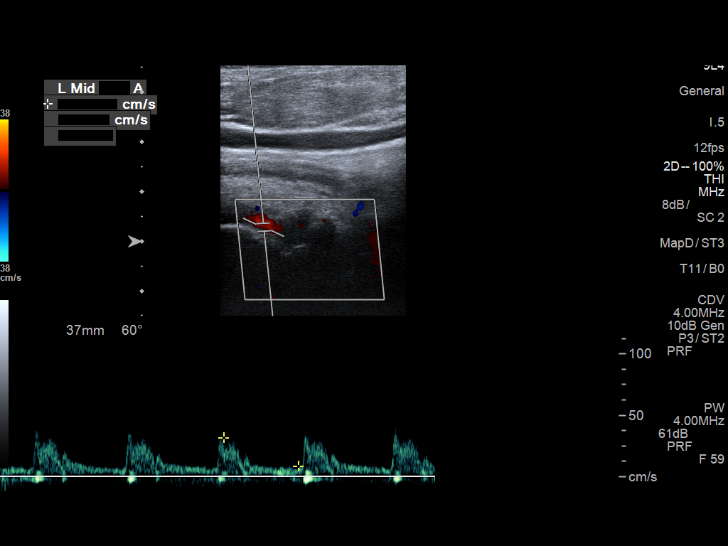

[14 of 24 positions shown; findings below may reference images not displayed]

FINDINGS: Criteria: Quantification of carotid stenosis is based on velocity
parameters that correlate the residual internal carotid diameter
with NASCET-based stenosis levels, using the diameter of the distal
internal carotid lumen as the denominator for stenosis measurement.

The following velocity measurements were obtained:

RIGHT

ICA: 90 cm/sec

CCA: 77 cm/sec

SYSTOLIC ICA/CCA RATIO:

ECA: 106 cm/sec

LEFT

ICA: 101 cm/sec

CCA: 67 cm/sec

SYSTOLIC ICA/CCA RATIO:

ECA: 97 cm/sec

RIGHT CAROTID ARTERY: Moderate irregular calcified plaque in the
bulb. Low resistance internal carotid Doppler pattern is preserved.

RIGHT VERTEBRAL ARTERY:  Antegrade.

LEFT CAROTID ARTERY: Mild irregular calcified plaque in the bulb.
Low resistance internal carotid Doppler pattern is preserved.

LEFT VERTEBRAL ARTERY:  Antegrade.
IMPRESSION: Less than 50% stenosis in the right and left internal carotid
arteries.

## 2019-06-27 DIAGNOSIS — J449 Chronic obstructive pulmonary disease, unspecified: Secondary | ICD-10-CM | POA: Diagnosis not present

## 2019-07-01 ENCOUNTER — Other Ambulatory Visit: Payer: Self-pay

## 2019-07-01 ENCOUNTER — Ambulatory Visit (INDEPENDENT_AMBULATORY_CARE_PROVIDER_SITE_OTHER): Payer: Medicare HMO | Admitting: Family Medicine

## 2019-07-01 ENCOUNTER — Encounter: Payer: Self-pay | Admitting: Family Medicine

## 2019-07-01 VITALS — BP 124/64 | HR 70 | Temp 97.4°F | Ht 65.0 in | Wt 269.0 lb

## 2019-07-01 DIAGNOSIS — G8929 Other chronic pain: Secondary | ICD-10-CM | POA: Diagnosis not present

## 2019-07-01 DIAGNOSIS — Z794 Long term (current) use of insulin: Secondary | ICD-10-CM

## 2019-07-01 DIAGNOSIS — E119 Type 2 diabetes mellitus without complications: Secondary | ICD-10-CM | POA: Diagnosis not present

## 2019-07-01 DIAGNOSIS — M5442 Lumbago with sciatica, left side: Secondary | ICD-10-CM

## 2019-07-01 DIAGNOSIS — I5032 Chronic diastolic (congestive) heart failure: Secondary | ICD-10-CM

## 2019-07-01 DIAGNOSIS — E1122 Type 2 diabetes mellitus with diabetic chronic kidney disease: Secondary | ICD-10-CM | POA: Diagnosis not present

## 2019-07-01 DIAGNOSIS — N183 Chronic kidney disease, stage 3 unspecified: Secondary | ICD-10-CM

## 2019-07-01 LAB — BASIC METABOLIC PANEL
BUN: 28 mg/dL — ABNORMAL HIGH (ref 6–23)
CO2: 28 mEq/L (ref 19–32)
Calcium: 8.5 mg/dL (ref 8.4–10.5)
Chloride: 109 mEq/L (ref 96–112)
Creatinine, Ser: 2.15 mg/dL — ABNORMAL HIGH (ref 0.40–1.50)
GFR: 30.42 mL/min — ABNORMAL LOW (ref >60.00)
Glucose, Bld: 81 mg/dL (ref 70–99)
Potassium: 3.9 mEq/L (ref 3.5–5.1)
Sodium: 142 mEq/L (ref 135–145)

## 2019-07-01 LAB — TSH: TSH: 0.03 u[IU]/mL — ABNORMAL LOW (ref 0.35–4.50)

## 2019-07-01 LAB — POCT GLYCOSYLATED HEMOGLOBIN (HGB A1C): Hemoglobin A1C: 7.4 % — AB (ref 4.0–5.6)

## 2019-07-01 MED ORDER — ACCU-CHEK AVIVA PLUS W/DEVICE KIT: PACK | 0 refills | Status: AC

## 2019-07-01 MED ORDER — OXYCODONE HCL 15 MG PO TABS: 15.0000 mg | ORAL_TABLET | Freq: Every day | ORAL | 0 refills | Status: DC | PRN

## 2019-07-01 MED ORDER — NOVOLIN 70/30 (70-30) 100 UNIT/ML ~~LOC~~ SUSP: 50.0000 [IU] | SUBCUTANEOUS | Status: DC

## 2019-07-01 MED ORDER — ALBUTEROL SULFATE HFA 108 (90 BASE) MCG/ACT IN AERS: 2.0000 | INHALATION_SPRAY | Freq: Four times a day (QID) | RESPIRATORY_TRACT | 3 refills | Status: DC | PRN

## 2019-07-01 MED ORDER — BD SWAB SINGLE USE REGULAR PADS: MEDICATED_PAD | 5 refills | Status: DC

## 2019-07-01 MED ORDER — "INSULIN SYRINGE 30G X 5/16"" 1 ML MISC": 3 refills | Status: DC

## 2019-07-01 MED ORDER — ACCU-CHEK AVIVA PLUS VI STRP: ORAL_STRIP | 3 refills | Status: DC

## 2019-07-01 NOTE — Progress Notes (Signed)
This visit occurred during the SARS-CoV-2 public health emergency.  Safety protocols were in place, including screening questions prior to the visit, additional usage of staff PPE, and extensive cleaning of exam room while observing appropriate contact time as indicated for disinfecting solutions.  Diabetes:  Using medications without difficulties: yes but he had a lower sugar when he didn't take a snack prior to bed.   Hypoglycemic episodes: see above.  Hyperglycemic episodes: no Feet problems:no Blood Sugars averaging: usually 90-130 eye exam within last year: he has f/u pending for 08/2019.   A1c d/w pt at OV.   Pulmonary disease.  He hasn't tolerated cutting back on supplemental O2.  Has needed 4.5-5 L at baseline.  Still alternating 20 vs 40mg  torsemide, with dec in BLE edema in the AM, more later in the day.  He does not seem to notice a significant change in the edema with 20 versus 40 mg.  Breo helped some in the meantime, compliant.    He is trying to put up with back and shoulder pain with tylenol.  No recent oxycodone use in the last few weeks.    PMH and SH reviewed  ROS: Per HPI unless specifically indicated in ROS section   Meds, vitals, and allergies reviewed.   GEN: nad, alert and oriented HEENT: ncat, on O2 at baseline via nasal cannula NECK: supple w/o LA CV: rrr. PULM: ctab, no inc wob ABD: soft, +bs EXT: 1-2+ BLE edema and compression stockings, at baseline. SKIN: no acute rash

## 2019-07-01 NOTE — Patient Instructions (Signed)
Go to the lab on the way out.  We'll contact you with your lab report.  Cut back on the PM insulin dose.  Cut back to 45 units, then down to 40 if you still have nighttime lows.   Use the pain medicine if needed.  Update me as needed.   Recheck in about 3 months.   I'll check on options for your BP meds in the meantime.  Take care.  Glad to see you.

## 2019-07-02 ENCOUNTER — Other Ambulatory Visit: Payer: Self-pay | Admitting: Family Medicine

## 2019-07-02 DIAGNOSIS — E039 Hypothyroidism, unspecified: Secondary | ICD-10-CM

## 2019-07-02 NOTE — Assessment & Plan Note (Addendum)
I need input from pulmonary and cardiology.  I did not yet change any of his blood pressure medications.  His O2 requirement has not improved in spite of the change to [facility].  He does not have focal decrease in breath sounds or wheeze on exam today.  I need input about potentially changing his blood pressure medications given his right heart failure/hypoxia that persists in spite of change to [facility].  I thank all involved.  Recheck BMP today pending, given his chronic kidney disease.  >25 minutes spent in face to face time with patient, >50% spent in counselling or coordination of care

## 2019-07-02 NOTE — Assessment & Plan Note (Signed)
He had nocturnal hypoglycemia, discussed cautions, discussed getting a snack before bed.  Would cut back evening dose of insulin to 45 units.  If he has persistent nocturnal hypoglycemia then would cut back to 40 units at night.  Continue his morning dose as is with 70 units.  Recheck periodically.  He agrees with plan.

## 2019-07-02 NOTE — Assessment & Plan Note (Signed)
He is not a good candidate for NSAIDs.  He is used oxycodone rarely when he has a flare of sciatica/back pain.  He will hold his prescription for now, use only as needed.  Routine cautions given.  He agrees.

## 2019-07-07 ENCOUNTER — Other Ambulatory Visit: Payer: Self-pay | Admitting: Family Medicine

## 2019-07-07 MED ORDER — LEVOTHYROXINE SODIUM 50 MCG PO TABS: 50.0000 ug | ORAL_TABLET | ORAL | Status: DC

## 2019-07-10 DIAGNOSIS — H02403 Unspecified ptosis of bilateral eyelids: Secondary | ICD-10-CM | POA: Diagnosis not present

## 2019-07-18 ENCOUNTER — Other Ambulatory Visit: Payer: Self-pay

## 2019-07-18 NOTE — Patient Outreach (Signed)
[facility]) Care Management  07/18/2019  Stephen [name] 1946-04-04 [id number]   Telephone Assessment     Outreach attempt #3 to patient. No answer after several rings and unable to leave message.     Plan: RN CM will make outreach attempt to patient within the month of March.   Enzo Montgomery, RN,BSN,CCM [place] [facility] Telephonic Care Management Coordinator Direct Phone: [phone] Toll Free: [phone] Fax: [phone]

## 2019-07-21 ENCOUNTER — Ambulatory Visit: Payer: Medicare HMO

## 2019-07-24 ENCOUNTER — Other Ambulatory Visit: Payer: Self-pay | Admitting: Family Medicine

## 2019-07-24 ENCOUNTER — Other Ambulatory Visit: Payer: Self-pay | Admitting: Cardiovascular Disease

## 2019-07-28 DIAGNOSIS — J449 Chronic obstructive pulmonary disease, unspecified: Secondary | ICD-10-CM | POA: Diagnosis not present

## 2019-08-14 ENCOUNTER — Telehealth: Payer: Self-pay | Admitting: Cardiovascular Disease

## 2019-08-14 DIAGNOSIS — J449 Chronic obstructive pulmonary disease, unspecified: Secondary | ICD-10-CM | POA: Diagnosis not present

## 2019-08-14 NOTE — Telephone Encounter (Signed)
Primary Cardiologist:Timothy Rockey Situ, MD  Chart reviewed as part of pre-operative protocol coverage. Because of Stephen [name] past medical history and time since last visit, he/she will require a follow-up visit in order to better assess preoperative cardiovascular risk.  Pre-op covering staff: - Please schedule appointment and call patient to inform them. - Please contact requesting surgeon's office via preferred method (i.e, phone, fax) to inform them of need for appointment prior to surgery.  If applicable, this message will also be routed to pharmacy pool and/or primary cardiologist for input on holding anticoagulant/antiplatelet agent as requested below so that this information is available at time of patient's appointment.   Jossie Ng. [facility] [address] Office [phone] Fax [phone]

## 2019-08-14 NOTE — Telephone Encounter (Signed)
   [facility] Pre-operative Risk Assessment    Request for surgical clearance:  1. What type of surgery is being performed? Bleph/ptosis repair  2. When is this surgery scheduled? 08/22/19  3. What type of clearance is required (medical clearance vs. Pharmacy clearance to hold med vs. Both)? both  4. Are there any medications that need to be held prior to surgery and how long? plavix for 4 days and aspirin for 12 days  5. Practice name and name of physician performing surgery? [facility]   6. What is your office phone number [phone]   7.   What is your office fax number [phone]  8.   Anesthesia type (None, local, MAC, general) ? Not noted   Marykay Lex 08/14/2019, 4:34 PM  _________________________________________________________________   (provider comments below)

## 2019-08-15 ENCOUNTER — Encounter: Payer: Self-pay | Admitting: Ophthalmology

## 2019-08-15 ENCOUNTER — Encounter: Payer: Self-pay | Admitting: Family

## 2019-08-15 ENCOUNTER — Ambulatory Visit (INDEPENDENT_AMBULATORY_CARE_PROVIDER_SITE_OTHER): Payer: Medicare HMO | Admitting: Family

## 2019-08-15 ENCOUNTER — Other Ambulatory Visit: Payer: Self-pay

## 2019-08-15 VITALS — BP 128/60 | HR 59 | Ht 65.0 in | Wt 269.8 lb

## 2019-08-15 DIAGNOSIS — I270 Primary pulmonary hypertension: Secondary | ICD-10-CM

## 2019-08-15 MED ORDER — CLOPIDOGREL BISULFATE 75 MG PO TABS: 75.0000 mg | ORAL_TABLET | Freq: Every day | ORAL | 1 refills | Status: DC

## 2019-08-15 NOTE — Progress Notes (Signed)
Office Visit    Patient Name: Stephen [name] Date of Encounter: 08/15/2019  Primary Care Provider:  [name], MD Primary Cardiologist:  Ida Rogue, MD Electrophysiologist:  None   Chief Complaint    Stephen [name] is a 74 y.o. male with a hx of pulmonary hypertension, chronic diastolic heart failure, CAD, pulmonary fibrosis s/p wedge resection, obesity, COPD on home O2, OSA on CPAP, DM2, CKD presents today for cardiac clearance for surgery   Past Medical History    Past Medical History:  Diagnosis Date  . Adrenal gland anomaly   . Arthritis   . CAD (coronary artery disease)    a. 04/2006 MI and PCI/stenting to mLCx & mRCA; b. 07/2009 Cath: patent LCX/RCA stents;  c. 12/2016 NSTEMI/Cath: LM 30, LAD 30p, 70m 30d, D1/2/3 min irregs, LCX 1063mSR, OM1 min irregs, RCA 20p, 10 ISR, 7022m0d, RPDA min irregs, RPAV 40-->Med Rx; d. 12/2017 MV: fixed lateral wall scar, mild anterior/anterior septal ischemia. EF 30-44%.  . CHF (congestive heart failure) ([place] . CKD (chronic kidney disease), stage III   . Colon polyps   . DM2 (diabetes mellitus, type 2) (HCC)    insulin requiring  . Dyslipidemia   . GERD (gastroesophageal reflux disease)   . History of echocardiogram    a. TTE 12/2016: EF 50-55%, mod concentric LVH, images inadequate for wall motion assessment, not technically sufficienct to allow for LV dias fxn, calcified mitral annulus   . HTN (hypertension)   . Kidney stones   . Left Renal artery stenosis ([place]  a. 07/2013 s/p PTA/stenting ([place]); b. 07/2017 Renal Duplex: No significant RAS.  . MMarland Kitchenocardial infarction ([place] . OSA (obstructive sleep apnea)   . Pulmonary fibrosis ([place]  a. 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  . Recurrent UTI   . Rotator cuff disorder    right  . Skin cancer    head  . Thyroid disorder   . Venous insufficiency of both lower extremities    a. s/p laser treatment.   Past  Surgical History:  Procedure Laterality Date  . adrenal adenoma removal  1990-   right  . CARPAL TUNNEL RELEASE     bilateral  . CATARACT EXTRACTION W/PHACO Right 07/08/2018   Procedure: CATARACT EXTRACTION PHACO AND INTRAOCULAR LENS PLACEMENT (IOC) RIGHT DIABETIC;  Surgeon: KinEulogio BearD;  [place];  Laterality: Right;  Diabetic - insulin sleep apnea  . CATARACT EXTRACTION W/PHACO Left 08/05/2018   Procedure: CATARACT EXTRACTION PHACO AND INTRAOCULAR LENS PLACEMENT (IO[place] DIABETIC;  Surgeon: KinEulogio BearD;  [place] [facility];  Laterality: Left;  DIABETIC  . CORONARY ANGIOPLASTY WITH STENT PLACEMENT  2007   x 2  . KNEE SURGERY     right  . LEFT HEART CATH AND CORONARY ANGIOGRAPHY N/A 01/11/2017   Procedure: Left Heart Cath and Coronary Angiography;  Surgeon: AriWellington HampshireD;  [facility];  Service: Cardiovascular;  Laterality: N/A;  . LUNG BIOPSY    . RENAL ARTERY STENT  2015   L  . SKIN CANCER EXCISION     back of head    Allergies  Allergies  Allergen Reactions  . Calcium Channel Blockers     Would avoid if possible due to h/o peripheral edema    History of Present Illness    Stephen Robarts  [name] is a 74 y.o. male with a hx of pulmonary hypertension, chronic diastolic heart failure, CAD, pulmonary fibrosis s/p wedge resection, obesity, renal artery stenosis s/p left renal stent, COPD on home O2, OSA on CPAP, DM2, CKD, venous insufficiency. He was last seen via telemedicine 01/16/2019 by Dr. Haroldine Laws and 11/13/2018 via telemedicine by Dr. Rockey Situ.  Pleasant gentleman with a very complicated medical history.  CAD s/p PCI to mid RCA mid left circumflex October 2007.  Cardiac catheterization July 2018 with elevated left ventricle end-diastolic pressure.  He had a Myoview July 2019 with old region of infarct, possible region of ischemia though significant attenuation  artifact.  Catheterization was deferred as he was asymptomatic and had renal dysfunction.  Echo 09/2018 with EF 45-50%, moderately elevated right heart pressures estimated at 50 mmHg. He has a history of interstitial fibrosis on chronic oxygen needs s/p wedge resection.  He additionally had open lung biopsy November 2009 for bilateral pulmonary infiltrates.  He had renal artery stenosis and underwent left renal artery stent early 2020.  His heart failure therapies as well as cardiac catheterization have been limited by his renal function.  Present today with his wife. He reports no chest pain, pressure, tightness.  Tells me he will get a "twinge "in the right side of his chest less than once per month that lasts only seconds and self resolves.  Reports his shortness of breath and dyspnea on exertion are about his baseline.  He wears 4 L of oxygen at home and wears 3 L when he travels.  He was started on Breo Ellipta by his pulmonologist for asthma and he tells me he is not certain this medicine has made much of a difference.  Encouraged him to continue to utilize.  Reports his lower extremity edema stable.  Continues to alternate doses of torsemide 20 mg and 40 mg.  Wears compression stockings daily.  Does try to keep his feet elevated when sitting.  He has upcoming Bleph/ptosis repair on 08/21/18 with [facility]. Was requested to hold Plavix x4 days and Aspirin x12 days.  Tells me he has been taking 324 mg of aspirin for some time.  Unclear who made this recommendation.  We discussed bleeding risk in the setting of 344 mg of aspirin and Plavix.  He is agreeable to reduce his dose.    He is due for follow-up of his pulmonary hypertension with Dr. Haroldine Laws.  EKGs/Labs/Other Studies Reviewed:   The following studies were reviewed today:  PFT 02/18/19 Spirometry Data Is Acceptable and Reproducible     Moderate Restrictive  Disease with Significant Broncho-Dilator Response   Findings  may suggest underlying ILD with small obstructive airways disease  Echo 09/21/18 1. Challenging image quality. Definity used.   2. The left ventricle has mildly decreased systolic function, with an  ejection fraction of 45 to 50%. The cavity size was normal. Select images  with lateral and inferolateral wall hypokinesis. There is mildly increased  left ventricular wall thickness.  Left ventricular diastolic Doppler parameters are consistent with impaired  relaxation.   3. The right ventricle has normal systolic function. The cavity was  normal. There is no increase in right ventricular wall thickness. Right  ventricular systolic pressure is moderately elevated with an estimated  pressure of 49.9 mmHg.   4. Left atrial size was mildly dilated.   Renal Artery Duplex 02/2019 Right: No evidence of right renal artery stenosis. Normal right         Resisitive  Index. Normal size right kidney. Normal cortical         thickness of right kidney.  Left:  No evidence of left renal artery stenosis. Normal left         Resistive Index. Normal size of left kidney.   Recent Labs: 09/20/2018: B Natriuretic Peptide 149.0 09/21/2018: Hemoglobin 14.1; Platelets 209 12/23/2018: ALT 13 07/01/2019: BUN 28; Creatinine, Ser 2.15; Potassium 3.9; Sodium 142; TSH 0.03 Repeated and verified X2.  Recent Lipid Panel    Component Value Date/Time   CHOL 125 12/23/2018 0917   TRIG 142.0 12/23/2018 0917   HDL 27.70 (L) 12/23/2018 0917   CHOLHDL 5 12/23/2018 0917   VLDL 28.4 12/23/2018 0917   LDLCALC 69 12/23/2018 0917    Home Medications   Current Meds  Medication Sig  . ACCU-CHEK SOFTCLIX LANCETS lancets Use as instructed to check blood sugar three times daily and as needed.  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  Marland Kitchen acetaminophen (TYLENOL) 325 MG tablet Take 2 tablets (650 mg total) by mouth every 6 (six) hours as needed for mild pain (or Fever >/= 101).  Marland Kitchen albuterol (VENTOLIN HFA) 108 (90 Base) MCG/ACT  inhaler Inhale 2 puffs into the lungs every 6 (six) hours as needed for wheezing or shortness of breath. Dispense 3 inhalers.  . Alcohol Swabs (B-D SINGLE USE SWABS REGULAR) PADS Use to cleans area prior to checking blood sugar three times daily and as needed.  Diagnosis:  E11.22  Insulin dependent.  Marland Kitchen amLODipine (NORVASC) 5 MG tablet Take 1 tablet (5 mg total) by mouth daily.  Marland Kitchen aspirin 81 MG tablet Take 81 mg by mouth daily.    Marland Kitchen atorvastatin (LIPITOR) 80 MG tablet TAKE 1 TABLET AT BEDTIME  . benzonatate (TESSALON) 100 MG capsule Take 1-2 capsules (100-200 mg total) by mouth 3 (three) times daily as needed.  . Blood Glucose Monitoring Suppl (ACCU-CHEK AVIVA PLUS) w/Device KIT Use to check blood sugar three times daily and as needed.  Diagnosis: E11.22  Insulin-dependent  . carvedilol (COREG) 12.5 MG tablet Take 1 tablet (12.5 mg total) by mouth 2 (two) times daily with a meal.  . cloNIDine (CATAPRES) 0.3 MG tablet Take 1 tablet (0.3 mg total) by mouth 3 (three) times daily.  . clopidogrel (PLAVIX) 75 MG tablet Take 1 tablet (75 mg total) by mouth daily.  . diclofenac sodium (VOLTAREN) 1 % GEL Apply 2 g topically 2 (two) times daily as needed (for left knee pain.).  Marland Kitchen diphenhydrAMINE (BENADRYL) 50 MG tablet Take 50 mg by mouth at bedtime.   . docusate sodium (COLACE) 100 MG capsule Take 100 mg by mouth daily as needed for mild constipation.   Marland Kitchen erythromycin ophthalmic ointment Place into the left eye every 6 (six) hours.  Marland Kitchen ezetimibe (ZETIA) 10 MG tablet Take 1 tablet (10 mg total) by mouth daily.  . fluticasone furoate-vilanterol (BREO ELLIPTA) 100-25 MCG/INH AEPB Inhale 1 puff into the lungs daily.  Marland Kitchen gabapentin (NEURONTIN) 400 MG capsule Take 2 tabs in the AM and 3 at night.  Marland Kitchen glucose blood (ACCU-CHEK AVIVA PLUS) test strip Use as instructed to check blood sugar 3 times daily or as needed.  Diagnosis:  E11.22  Insulin-dependent.  . insulin NPH-regular Human (NOVOLIN 70/30) (70-30) 100 UNIT/ML  injection Inject 50-70 Units into the skin See admin instructions. Inject 70u under the skin every morning and 40-45u under the skin at bedtime  . Insulin Syringe-Needle U-100 (INSULIN SYRINGE 1CC/30GX5/16") 30G X 5/16" 1 ML MISC Use as  directed to take insulin Dx 250.62  . isosorbide mononitrate (IMDUR) 60 MG 24 hr tablet Take 1.5 tablets (90 mg total) by mouth 2 (two) times daily.  Marland Kitchen levothyroxine (SYNTHROID) 50 MCG tablet Take 1 tablet (50 mcg total) by mouth every Monday, Tuesday, Wednesday, Thursday, and Friday. Skip Saturday and Sunday.  . nitroGLYCERIN (NITROSTAT) 0.4 MG SL tablet Place 1 tablet (0.4 mg total) under the tongue every 5 (five) minutes as needed. May repeat x3  . omeprazole (PRILOSEC) 20 MG capsule Take 20 mg by mouth daily.    Marland Kitchen oxyCODONE (ROXICODONE) 15 MG immediate release tablet Take 1 tablet (15 mg total) by mouth daily as needed for pain. Hold for patient to request rx  . polyethylene glycol powder (GLYCOLAX/MIRALAX) 17 GM/SCOOP powder Take 17 g by mouth daily as needed (for constipation).  . potassium chloride (K-DUR) 10 MEQ tablet Take 1-2 tablets (10-20 mEq total) by mouth as directed. Alternating every other day. Take 1 tablet (10 mEq) daily and 2 tablets (20 mEq) daily  . sildenafil (REVATIO) 20 MG tablet TAKE ONE TABLET BY MOUTH THREE TIMES A DAY  . tamsulosin (FLOMAX) 0.4 MG CAPS capsule Take 1 capsule (0.4 mg total) by mouth at bedtime.  . torsemide (DEMADEX) 20 MG tablet Take 1-2 tablets (20-40 mg total) by mouth as directed. Alternate every other day. 1 tablet (20 mg) daily and 2 tablets (40 mg) daily  . [DISCONTINUED] clopidogrel (PLAVIX) 75 MG tablet Take 1 tablet (75 mg total) by mouth daily. Please call to schedule office visit for further refills. Thank you!    Review of Systems    Review of Systems  Constitution: Negative for chills, fever and malaise/fatigue.  Cardiovascular: Positive for dyspnea on exertion. Negative for chest pain, irregular heartbeat,  leg swelling, near-syncope, orthopnea, palpitations and syncope.  Respiratory: Negative for cough, shortness of breath and wheezing.   Gastrointestinal: Negative for melena, nausea and vomiting.  Genitourinary: Negative for hematuria.  Neurological: Negative for dizziness, light-headedness and weakness.   All other systems reviewed and are otherwise negative except as noted above.  Physical Exam    VS:  BP 128/60 (BP Location: Left Arm, Patient Position: Sitting, Cuff Size: Normal)   Pulse (!) 59   Ht '5\' 5"'$  (1.651 m)   Wt 269 lb 12 oz (122.4 kg)   SpO2 99% Comment: with oxygen at 3 Liters  BMI 44.89 kg/m  , BMI Body mass index is 44.89 kg/m. GEN: Well nourished, overweight. well developed, in no acute distress. HEENT: normal. Neck: Supple, no JVD, carotid bruits, or masses. Cardiac: RRR, no murmurs, rubs, or gallops. No clubbing, cyanosis. Nonpitting edema bilateral LE.  Radials/PT 2+ and equal bilaterally.  Respiratory:  Respirations regular and unlabored, clear to auscultation bilaterally. On 3L oxygen at time of exam  GI: Soft, nontender, nondistended, BS + x 4. MS: No deformity or atrophy. Skin: Warm and dry, no rash. Neuro:  Strength and sensation are intact. Psych: Normal affect.  Accessory Clinical Findings    ECG personally reviewed by me today - SB LAFB with LVH and repolarization - no acute changes nor acute ST/T wave changes  Assessment & Plan    1. Cardiac clearance for surgery - Upcoming bleph/ptosis repair with [facility] 08/22/19. According to the Revised Cardiac Risk Index (RCRI), his Perioperative Risk of Major Cardiac Event is (%): 11. His Functional Capacity in METs is: 4.3 according to the Duke Activity Status Index (DASI). He is deemed acceptable cardiovascular risk and may  proceed without further cardiac testing.   Per discussion with [facility] and his primary cardiologist Dr. Rockey Situ, patient will stop Plavix 5 days prior to procedure. Patient  will continue aspirin throughout. Surgeon will instruct patient when to resume Plavix post-procedure. Patient and his wife were educated on this plan during office visit.  This note will be routed to the requesting surgeon's office via the Epic fax function.   2. CAD - Stable with no anginal symptoms. No indication for ischemic evaluation at this time. GDMT includes Aspirin, Plavix, Coreg, Atorvastatin, Imdur. He will reduce Aspirin to '81mg'$  daily as he was taking '324mg'$  daily for unclear reason. Refill of Plavix provided today.  3. Chronic diastolic heart failure - NYHA II-III with multifactorial DOE. Continue GDMT Coreg, Torsemide. Further heart failure management limited by CKD. With new evidence may benefit from [facility] in setting of HFpEF, but low suspicion his kidney function would tolerate.  4. Pulmonary HTN - DOE stable at baseline. Continues to follow with pulmonology. Was seen via virtual visit 01/2019 by Dr. Haroldine Laws with recommendation for follow up in 6 months and possible RHC. Will reach out to Dr. Haroldine Laws as patient is over due for follow up. Continue Torsemide and Sildenafil at present doses.  5. HTN - BP well controlled in office today. Does not check routinely at home. Concern that he may be on additional BP medications as his nephrologist most recent note lists Diovan and Maxide. Will request he bring pill bottles to next appointment.  6. CKDIII - Careful titration of diuretics and anti-hypertensive medications.   7. DM2 - Follows with PCP and nephrology.  8. Hypothyroidism - Follows with PCP. On Synthroid. Dose recently changed for low TSH. Plan to repeat in March.   9. ILD on O2/Asthma - Follows with pulmonology. Presently on 3-4L home O2. Encouraged compliance with his Breo.   10. HLD, LDL goal <70 - LDL 12/23/18 of 69, at goal. Continue Atorvastatin and Zetia.  Disposition: Will reach out to Dr. Haroldine Laws regarding the patients pulmonary hypertension follow up. Follow up  in March   with Dr. Rockey Situ as previously scheduled.    Loel Dubonnet, NP 08/15/2019, 4:21 PM

## 2019-08-15 NOTE — Telephone Encounter (Signed)
Called wife and scheduled appt for 08-18-19 ok to hold ASA now and then hold Plavix on Monday for clearance appt with Caitlyn Walker. Ok to have COVID vaccine today per Coletta Memos, FNP. Pt will arrive early wearing a mask, wife notified that she may not be able to come with pt to appt.

## 2019-08-15 NOTE — Patient Instructions (Signed)
Medication Instructions:  Your physician has recommended you make the following change in your medication:   CHANGE Aspirin to 81mg  daily  Regarding your upcoming eye surgery...  Continue Aspirin 81mg  daily (this was approved by the eye doctor's office)  STOP Plavix 5 days prior to procedure per Dr. Charlyne Petrin the eye doctor when to resume Plavix after your procedure  *If you need a refill on your cardiac medications before your next appointment, please call your pharmacy*  Lab Work: None ordered today.  Testing/Procedures: You had an EKG today. It was stable compared to previous.   No additional testing ordered at this time.   Follow-Up: At [facility], you and your health needs are our priority.  As part of our continuing mission to provide you with exceptional heart care, we have created designated Provider Care Teams.  These Care Teams include your primary Cardiologist (physician) and Advanced Practice Providers (APPs -  Physician Assistants and Nurse Practitioners) who all work together to provide you with the care you need, when you need it.  Your next appointment:    In March with Dr. Rockey Situ as previously scheduled  I will reach out to Dr. Haroldine Laws to see if we can get you in with him regarding your pulmonary hypertension.   Other Instructions  It was a pleasure to meet you today! I spoke with Dr. Rockey Situ as well as the eye doctor's office. Please continue Aspirin 81mg  daily.

## 2019-08-15 NOTE — Telephone Encounter (Signed)
Spoke with patient and wife to reschedule appt as this appt was not made in an open slot.    Patient will see Dr. Rockey Situ on 2/16 and wants to know if the plavix should still be held.  Please update after appt changes.

## 2019-08-18 ENCOUNTER — Encounter: Payer: Self-pay | Admitting: Anesthesiology

## 2019-08-18 ENCOUNTER — Ambulatory Visit: Payer: Medicare HMO | Admitting: Family

## 2019-08-19 ENCOUNTER — Ambulatory Visit: Payer: Medicare HMO | Admitting: Cardiovascular Disease

## 2019-08-20 ENCOUNTER — Other Ambulatory Visit: Admission: RE | Admit: 2019-08-20 | Payer: Medicare HMO | Source: Ambulatory Visit

## 2019-08-22 ENCOUNTER — Ambulatory Visit: Admit: 2019-08-22 | Payer: Medicare HMO | Admitting: Ophthalmology

## 2019-08-22 SURGERY — BLEPHAROPLASTY
Anesthesia: Monitor Anesthesia Care | Laterality: Bilateral

## 2019-08-27 DIAGNOSIS — E113213 Type 2 diabetes mellitus with mild nonproliferative diabetic retinopathy with macular edema, bilateral: Secondary | ICD-10-CM | POA: Diagnosis not present

## 2019-08-28 DIAGNOSIS — J449 Chronic obstructive pulmonary disease, unspecified: Secondary | ICD-10-CM | POA: Diagnosis not present

## 2019-09-09 ENCOUNTER — Other Ambulatory Visit: Payer: Self-pay

## 2019-09-09 ENCOUNTER — Encounter: Payer: Self-pay | Admitting: Family Medicine

## 2019-09-09 ENCOUNTER — Ambulatory Visit (INDEPENDENT_AMBULATORY_CARE_PROVIDER_SITE_OTHER): Payer: Medicare HMO | Admitting: Family Medicine

## 2019-09-09 VITALS — BP 132/66 | HR 70 | Temp 97.1°F | Ht 65.0 in | Wt 269.6 lb

## 2019-09-09 DIAGNOSIS — E119 Type 2 diabetes mellitus without complications: Secondary | ICD-10-CM

## 2019-09-09 DIAGNOSIS — E039 Hypothyroidism, unspecified: Secondary | ICD-10-CM | POA: Diagnosis not present

## 2019-09-09 DIAGNOSIS — L0390 Cellulitis, unspecified: Secondary | ICD-10-CM | POA: Diagnosis not present

## 2019-09-09 LAB — HEMOGLOBIN A1C: Hgb A1c MFr Bld: 7.1 % — ABNORMAL HIGH (ref 4.6–6.5)

## 2019-09-09 LAB — TSH: TSH: <0.01 u[IU]/mL — ABNORMAL LOW (ref 0.35–4.50)

## 2019-09-09 MED ORDER — DOXYCYCLINE HYCLATE 100 MG PO TABS: 100.0000 mg | ORAL_TABLET | Freq: Two times a day (BID) | ORAL | 0 refills | Status: DC

## 2019-09-09 NOTE — Progress Notes (Signed)
This visit occurred during the SARS-CoV-2 public health emergency.  Safety protocols were in place, including screening questions prior to the visit, additional usage of staff PPE, and extensive cleaning of exam room while observing appropriate contact time as indicated for disinfecting solutions.  Leg injury.  He was cutting a tree that fell. He scraped his L shin.  This was ~09/04/19.  Locally warm and tender, in the last 2-3 days.  Can still bear weight. Can still walk but with soreness.  No fevers. He was wearing long pants at the time.    Sugar has been about 120 recently on home checks.    He had moderna vaccine, 08/15/19.  He has 2nd vaccine pending for 09/12/19.    Due for f/u TSH and A1c, d/w pt.  See notes on labs.    Meds, vitals, and allergies reviewed.   ROS: Per HPI unless specifically indicated in ROS section   nad ncat On O2 at baseline rrr ctab Vertical scattered scabbing w/o abscess on left shin.  Some peripheral erythema at the scabs noted. Overall affected area is 10cm tall and 6cm wide, but he does not have scabbing over the entire area..   2+ BLE edema.  No discharge.  No fluctuant mass.

## 2019-09-09 NOTE — Patient Outreach (Signed)
[facility]) Care Management  09/09/2019  Stephen [name] 1945/11/02 [id number]   Telephone Assessment   Outreach attempt to patient. No answer at present.    Plan: RN CM will make outreach attempt to patient in the month of May if no return call from patient.    Enzo Montgomery, RN,BSN,CCM [place] Management Telephonic Care Management Coordinator Direct Phone: [phone] Toll Free: [phone] Fax: [phone]

## 2019-09-09 NOTE — Patient Instructions (Signed)
Go to the lab on the way out.   If you have mychart we'll likely use that to update you.    Start doxycycline.  Update me as needed, especially if not better in a few days or if worse.   Okay to get the 2nd covid vaccine.  Take care.  Glad to see you.

## 2019-09-10 ENCOUNTER — Other Ambulatory Visit: Payer: Self-pay | Admitting: Family Medicine

## 2019-09-10 DIAGNOSIS — E039 Hypothyroidism, unspecified: Secondary | ICD-10-CM

## 2019-09-10 MED ORDER — LEVOTHYROXINE SODIUM 25 MCG PO TABS: 25.0000 ug | ORAL_TABLET | Freq: Every day | ORAL | 3 refills | Status: DC

## 2019-09-10 NOTE — Assessment & Plan Note (Addendum)
Presumed cellulitis.  Elevate, start doxycycline.  Routine cautions given to patient and he agrees/understands.  He will update me if not clearly getting better or if getting worse in the meantime.  We talked about his upcoming Covid vaccine.  In his particular case I think the risk of waiting on vaccination is higher than the risk of proceeding with vaccination at this point.  I think it makes sense to go ahead and get his next vaccine as scheduled.  Discussed.  He agrees.

## 2019-09-11 ENCOUNTER — Other Ambulatory Visit: Payer: Self-pay | Admitting: *Deleted

## 2019-09-12 DIAGNOSIS — Z23 Encounter for immunization: Secondary | ICD-10-CM | POA: Diagnosis not present

## 2019-09-15 ENCOUNTER — Telehealth: Payer: Self-pay

## 2019-09-15 ENCOUNTER — Ambulatory Visit: Payer: Medicare HMO | Admitting: Internal Medicine

## 2019-09-15 MED ORDER — CEPHALEXIN 500 MG PO CAPS: 500.0000 mg | ORAL_CAPSULE | Freq: Three times a day (TID) | ORAL | 0 refills | Status: DC

## 2019-09-15 NOTE — Telephone Encounter (Signed)
I have asked the front desk to check schedules here but I doubt there is anything available at this time of day, maybe earlier.

## 2019-09-15 NOTE — Telephone Encounter (Signed)
rx sent for keflex.  Would add that in the meantime.  Thanks.

## 2019-09-15 NOTE — Telephone Encounter (Signed)
Great. Thanks.  I appreciate the help of all involved.

## 2019-09-15 NOTE — Telephone Encounter (Signed)
Patient is scheduled with Webb Silversmith at 4:15 pm.  Is that ok?

## 2019-09-15 NOTE — Addendum Note (Signed)
Addended by: [name] on: 09/15/2019 05:27 PM   Modules accepted: Orders

## 2019-09-15 NOTE — Telephone Encounter (Signed)
Due to a miscommunication in our office, patient was not called to inform him that he had an appointment today at 415.   I called and spoke with patient.  He is doing okay but just has ongoing leg pain to area.  He states redness has not improved since starting the abx Dr. [name] gave him last week.  He feels like the pain only gets better when he is resting at night.  He denies any chest pain, cough or SOB and knows to go to the ER if any acute, worsening symptoms develop over night.  He would prefer not to go to the urgent care tonight unless necessary.   Due to stable symptoms, Dr. [name] mentioned that he could consider sending in new abx tonight for patient to start tonight and try.  He does want him to come in tomorrow morning (Dr. [name] has an appointment at [facility] and patient given that slot for eval).   Patient verbalizes understanding and will keep appointment with Dr. [name] for tomorrow am and would like for Dr. [name] to send the new r/x to [facility] in [place].   Will forward to Dr. [name] for review and copy Dr. [name] as an Juluis Rainier.

## 2019-09-15 NOTE — Telephone Encounter (Signed)
Mrs Dax left v/m (DPR signed) pt last seen at [facility] on 03/09/21and pt has been taking doxycycline. Mrs Willden said lt lower leg has more redness in the same area but is a darker red. The swelling in lt lower leg is about the same. Pt said he can feel a knot in the reddened area. The skin is warm to touch. No CP or SOB now.  Pt did have mid CP on 09/13/19 while taking out trash; pt took nitro and pain went away. Pt is elevating his leg when sitting. Lt leg hurts worse when ambulating and if sitting without putting his leg up. Mrs Hoard wants to know if should put heat on lt lower leg to help manage pain; pt has oxycodone but has not been taking it. Pt temp is 99 and pt did take 2nd covid vaccine on 09/12/19. Mrs Martinson is concerned could there be a blood clot in lt lower leg. Mrs Claytor request cb with what to do about pain in lt lower leg and does pt need further testing for possible blood clot..ED precautions given and Mrs Badeaux voiced understanding. Mrs Freilich will wait for cb after Dr [name] reviews.

## 2019-09-15 NOTE — Telephone Encounter (Signed)
I wouldn't suspect a clot but if the pain/redness is worse and not better with abx, then he needs to be rechecked either here/UC/ER.  If anyone can add pt on here, then please scheduled. I wouldn't put heat on the area.  Thanks.

## 2019-09-16 ENCOUNTER — Ambulatory Visit: Payer: Medicare HMO

## 2019-09-16 ENCOUNTER — Ambulatory Visit (INDEPENDENT_AMBULATORY_CARE_PROVIDER_SITE_OTHER): Payer: Medicare HMO | Admitting: Family Medicine

## 2019-09-16 ENCOUNTER — Other Ambulatory Visit: Payer: Self-pay

## 2019-09-16 ENCOUNTER — Encounter: Payer: Self-pay | Admitting: Family Medicine

## 2019-09-16 DIAGNOSIS — E1122 Type 2 diabetes mellitus with diabetic chronic kidney disease: Secondary | ICD-10-CM | POA: Diagnosis not present

## 2019-09-16 DIAGNOSIS — Z794 Long term (current) use of insulin: Secondary | ICD-10-CM | POA: Diagnosis not present

## 2019-09-16 DIAGNOSIS — I5032 Chronic diastolic (congestive) heart failure: Secondary | ICD-10-CM | POA: Diagnosis not present

## 2019-09-16 DIAGNOSIS — N183 Chronic kidney disease, stage 3 unspecified: Secondary | ICD-10-CM | POA: Diagnosis not present

## 2019-09-16 DIAGNOSIS — L03116 Cellulitis of left lower limb: Secondary | ICD-10-CM

## 2019-09-16 MED ORDER — CEFTRIAXONE SODIUM 1 G IJ SOLR
1.0000 g | Freq: Once | INTRAMUSCULAR | Status: AC
  Administered 2019-09-16: 1 g via INTRAMUSCULAR

## 2019-09-16 MED ORDER — CEFTRIAXONE SODIUM 1 G IJ SOLR: 1.0000 g | INTRAMUSCULAR | Status: DC

## 2019-09-16 NOTE — Patient Instructions (Addendum)
Restart doxycycline. Hold keflex for now. Given ceftriaxone injection today. Elevate leg above heart. Take an extra torsemide today. Follow blood sugar closely at home.

## 2019-09-16 NOTE — Assessment & Plan Note (Signed)
Volume overloaded but no change in breathing.. increase torsemide x 1 day.

## 2019-09-16 NOTE — Addendum Note (Signed)
Addended by: Carter Kitten on: 09/16/2019 10:11 AM   Modules accepted: Orders

## 2019-09-16 NOTE — Assessment & Plan Note (Signed)
Possible infected hematoma   Failed oral treatment. Pt diabetic and leg edematous.Marland Kitchen likely slowing healing. Restart doxy, hold  Treat with ceftriaxone 1 g  Today.  Close follow up in 24 hours for re-eval.  Lesion marked with permanent marker. Take addition dose today of torsemide.   Leg swollen but DVT not likely.. no CP, no change in SOb.

## 2019-09-16 NOTE — Progress Notes (Signed)
Chief Complaint  Patient presents with  . Leg Swelling    Left (see phone note in your inbasket)    History of Present Illness: HPI  74 year old male pt of Dr.  with history of  DM Presents for follow up left lower leg cellultitis.   He was seen on  09/09/2019 by PCP: Note reviewed in detail.  Following leg injry from a tree on 09/04/2019.Marland Kitchen are was sore and red Overall affected area is 10cm tall and 6cm wide, but he does not have scabbing over the entire area..   2+ BLE edema.   Treated with doxy.   Pt called on 3/14 stating no improvement with doxy.  Keflex aded for further coverage.   Today he reports redness has spread some.. increased pain. Despite 5-6 days of doxy. Was compliant with med.  Stretching leg makes it feel better. He has taken 2 dose of keflex in last 12 hours. No fever.  no discharge. Stable SOB, no chest pain.  No flu-like symptoms.  Has not been checking blod sugar late ly. Lab Results  Component Value Date   HGBA1C 7.1 (H) 09/09/2019       This visit occurred during the SARS-CoV-2 public health emergency.  Safety protocols were in place, including screening questions prior to the visit, additional usage of staff PPE, and extensive cleaning of exam room while observing appropriate contact time as indicated for disinfecting solutions.   COVID 19 screen:  No recent travel or known exposure to COVID19 The patient denies respiratory symptoms of COVID 19 at this time. The importance of social distancing was discussed today.     Review of Systems  Constitutional: Negative for chills and fever.  HENT: Negative for congestion and ear pain.   Eyes: Negative for pain and redness.  Respiratory: Negative for cough and shortness of breath.   Cardiovascular: Negative for chest pain, palpitations and leg swelling.  Gastrointestinal: Negative for abdominal pain, blood in stool, constipation, diarrhea, nausea and vomiting.  Genitourinary: Negative for dysuria.   Musculoskeletal: Negative for falls and myalgias.  Skin: Negative for rash.  Neurological: Negative for dizziness.  Psychiatric/Behavioral: Negative for depression. The patient is not nervous/anxious.       Past Medical History:  Diagnosis Date  . Adrenal gland anomaly   . Arthritis   . CAD (coronary artery disease)    a. 04/2006 MI and PCI/stenting to mLCx & mRCA; b. 07/2009 Cath: patent LCX/RCA stents;  c. 12/2016 NSTEMI/Cath: LM 30, LAD 30p, 47m 30d, D1/2/3 min irregs, LCX 1061mSR, OM1 min irregs, RCA 20p, 10 ISR, 7058m0d, RPDA min irregs, RPAV 40-->Med Rx; d. 12/2017 MV: fixed lateral wall scar, mild anterior/anterior septal ischemia. EF 30-44%.  . CHF (congestive heart failure) ([facility] . CKD (chronic kidney disease), stage III   . Colon polyps   . DM2 (diabetes mellitus, type 2) (HCC)    insulin requiring  . Dyslipidemia   . GERD (gastroesophageal reflux disease)   . History of echocardiogram    a. TTE 12/2016: EF 50-55%, mod concentric LVH, images inadequate for wall motion assessment, not technically sufficienct to allow for LV dias fxn, calcified mitral annulus   . HTN (hypertension)   . Kidney stones   . Left Renal artery stenosis ([facility]  a. 07/2013 s/p PTA/stenting ([place]); b. 07/2017 Renal Duplex: No significant RAS.  . MMarland Kitchenocardial infarction ([facility] . OSA (obstructive sleep apnea)   . 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  . Recurrent UTI   . Rotator cuff disorder    right  . Skin cancer    head  . Thyroid disorder   . Venous insufficiency of both lower extremities    a. s/p laser treatment.    reports that he has never smoked. He has never used smokeless tobacco. He reports that he does not drink alcohol or use drugs.   Current Outpatient Medications:  .  ACCU-CHEK SOFTCLIX LANCETS lancets, Use as instructed to check blood sugar three times daily and as needed.  Diagnosis: E11.22   Insulin-dependent., Disp: 100 each, Rfl: 5 .  acetaminophen (TYLENOL) 325 MG tablet, Take 2 tablets (650 mg total) by mouth every 6 (six) hours as needed for mild pain (or Fever >/= 101)., Disp: , Rfl:  .  albuterol (VENTOLIN HFA) 108 (90 Base) MCG/ACT inhaler, Inhale 2 puffs into the lungs every 6 (six) hours as needed for wheezing or shortness of breath. Dispense 3 inhalers., Disp: 48 g, Rfl: 3 .  Alcohol Swabs (B-D SINGLE USE SWABS REGULAR) PADS, Use to cleans area prior to checking blood sugar three times daily and as needed.  Diagnosis:  E11.22  Insulin dependent., Disp: 100 each, Rfl: 5 .  amLODipine (NORVASC) 5 MG tablet, Take 1 tablet (5 mg total) by mouth daily., Disp: 90 tablet, Rfl: 3 .  aspirin 81 MG tablet, Take 81 mg by mouth daily.  , Disp: , Rfl:  .  atorvastatin (LIPITOR) 80 MG tablet, TAKE 1 TABLET AT BEDTIME, Disp: 90 tablet, Rfl: 0 .  benzonatate (TESSALON) 100 MG capsule, Take 1-2 capsules (100-200 mg total) by mouth 3 (three) times daily as needed., Disp: 30 capsule, Rfl: 0 .  Blood Glucose Monitoring Suppl (ACCU-CHEK AVIVA PLUS) w/Device KIT, Use to check blood sugar three times daily and as needed.  Diagnosis: E11.22  Insulin-dependent, Disp: 1 kit, Rfl: 0 .  carvedilol (COREG) 12.5 MG tablet, Take 1 tablet (12.5 mg total) by mouth 2 (two) times daily with a meal., Disp: 180 tablet, Rfl: 3 .  cephALEXin (KEFLEX) 500 MG capsule, Take 1 capsule (500 mg total) by mouth 3 (three) times daily., Disp: 30 capsule, Rfl: 0 .  cloNIDine (CATAPRES) 0.3 MG tablet, Take 1 tablet (0.3 mg total) by mouth 3 (three) times daily., Disp: 270 tablet, Rfl: 3 .  clopidogrel (PLAVIX) 75 MG tablet, Take 1 tablet (75 mg total) by mouth daily., Disp: 90 tablet, Rfl: 1 .  diclofenac sodium (VOLTAREN) 1 % GEL, Apply 2 g topically 2 (two) times daily as needed (for left knee pain.)., Disp: 100 g, Rfl: 1 .  diphenhydrAMINE (BENADRYL) 50 MG tablet, Take 50 mg by mouth at bedtime. , Disp: , Rfl:  .  docusate  sodium (COLACE) 100 MG capsule, Take 100 mg by mouth daily as needed for mild constipation. , Disp: , Rfl:  .  erythromycin ophthalmic ointment, Place into the left eye every 6 (six) hours., Disp: 3.5 g, Rfl: 0 .  fluticasone furoate-vilanterol (BREO ELLIPTA) 100-25 MCG/INH AEPB, Inhale 1 puff into the lungs daily., Disp: 28 each, Rfl: 11 .  gabapentin (NEURONTIN) 400 MG capsule, Take 2 tabs in the AM and 3 at night., Disp: 450 capsule, Rfl: 3 .  glucose blood (ACCU-CHEK AVIVA PLUS) test strip, Use as instructed to check blood sugar 3 times daily or as needed.  Diagnosis:  E11.22  Insulin-dependent., Disp: 300 each, Rfl: 3 .  insulin  NPH-regular Human (NOVOLIN 70/30) (70-30) 100 UNIT/ML injection, Inject 50-70 Units into the skin See admin instructions. Inject 70u under the skin every morning and 40-45u under the skin at bedtime, Disp: 10 mL, Rfl:  .  Insulin Syringe-Needle U-100 (INSULIN SYRINGE 1CC/30GX5/16") 30G X 5/16" 1 ML MISC, Use as directed to take insulin Dx 250.62, Disp: 180 each, Rfl: 3 .  isosorbide mononitrate (IMDUR) 60 MG 24 hr tablet, Take 1.5 tablets (90 mg total) by mouth 2 (two) times daily., Disp: 270 tablet, Rfl: 3 .  levothyroxine (SYNTHROID) 25 MCG tablet, Take 1 tablet (25 mcg total) by mouth daily before breakfast., Disp: 90 tablet, Rfl: 3 .  nitroGLYCERIN (NITROSTAT) 0.4 MG SL tablet, Place 1 tablet (0.4 mg total) under the tongue every 5 (five) minutes as needed. May repeat x3, Disp: 25 tablet, Rfl: prn .  omeprazole (PRILOSEC) 20 MG capsule, Take 20 mg by mouth daily.  , Disp: , Rfl:  .  oxyCODONE (ROXICODONE) 15 MG immediate release tablet, Take 1 tablet (15 mg total) by mouth daily as needed for pain. Hold for patient to request rx, Disp: 30 tablet, Rfl: 0 .  polyethylene glycol powder (GLYCOLAX/MIRALAX) 17 GM/SCOOP powder, Take 17 g by mouth daily as needed (for constipation)., Disp: 255 g, Rfl: 1 .  potassium chloride (K-DUR) 10 MEQ tablet, Take 1-2 tablets (10-20 mEq  total) by mouth as directed. Alternating every other day. Take 1 tablet (10 mEq) daily and 2 tablets (20 mEq) daily, Disp: 180 tablet, Rfl: 3 .  sildenafil (REVATIO) 20 MG tablet, TAKE ONE TABLET BY MOUTH THREE TIMES A DAY, Disp: 270 tablet, Rfl: 0 .  tamsulosin (FLOMAX) 0.4 MG CAPS capsule, Take 1 capsule (0.4 mg total) by mouth at bedtime., Disp: 90 capsule, Rfl: 3 .  ezetimibe (ZETIA) 10 MG tablet, Take 1 tablet (10 mg total) by mouth daily., Disp: 90 tablet, Rfl: 3 .  torsemide (DEMADEX) 20 MG tablet, Take 1-2 tablets (20-40 mg total) by mouth as directed. Alternate every other day. 1 tablet (20 mg) daily and 2 tablets (40 mg) daily, Disp: 180 tablet, Rfl: 3   Observations/Objective: Blood pressure (!) 110/58, pulse 62, temperature 98.2 F (36.8 C), temperature source Temporal, height 5' 5" (1.651 m), weight 269 lb (122 kg), SpO2 95 %.  Physical Exam Constitutional:      Appearance: He is well-developed.  HENT:     Head: Normocephalic.     Right Ear: Hearing normal.     Left Ear: Hearing normal.     Nose: Nose normal.  Neck:     Thyroid: No thyroid mass or thyromegaly.     Vascular: No carotid bruit.     Trachea: Trachea normal.  Cardiovascular:     Rate and Rhythm: Normal rate and regular rhythm.     Pulses: Normal pulses.     Heart sounds: Heart sounds not distant. No murmur. No friction rub. No gallop.      Comments: No peripheral edema Pulmonary:     Effort: Pulmonary effort is normal. No respiratory distress.     Breath sounds: Normal breath sounds.  Skin:    General: Skin is warm and dry.     Findings: No rash.     Comments: Central dark red swollen area 10 x 8 Cm and slightly erythematous area 13 x9 cm surrounding.   2 plus pitting edema on left lower leg  Psychiatric:        Speech: Speech normal.  Behavior: Behavior normal.        Thought Content: Thought content normal.        Assessment and Plan   Left leg cellulitis Possible infected hematoma    Failed oral treatment. Pt diabetic and leg edematous.Marland Kitchen likely slowing healing. Restart doxy, hold  Treat with ceftriaxone 1 g  Today.  Close follow up in 24 hours for re-eval.  Lesion marked with permanent marker. Take addition dose today of torsemide.   Leg swollen but DVT not likely.. no CP, no change in SOb.  Type 2 diabetes mellitus with renal complication (HCC) Follow CBGs at home closely.  CHF (congestive heart failure) (HCC) Volume overloaded but no change in breathing.. increase torsemide x 1 day.     Eliezer Lofts, MD

## 2019-09-16 NOTE — Assessment & Plan Note (Signed)
Follow CBGs at home closely.

## 2019-09-17 ENCOUNTER — Ambulatory Visit (INDEPENDENT_AMBULATORY_CARE_PROVIDER_SITE_OTHER): Payer: Medicare HMO | Admitting: Family Medicine

## 2019-09-17 ENCOUNTER — Encounter: Payer: Self-pay | Admitting: Family Medicine

## 2019-09-17 VITALS — BP 140/60 | HR 74 | Temp 98.6°F | Ht 65.0 in | Wt 268.5 lb

## 2019-09-17 DIAGNOSIS — I5030 Unspecified diastolic (congestive) heart failure: Secondary | ICD-10-CM

## 2019-09-17 DIAGNOSIS — L089 Local infection of the skin and subcutaneous tissue, unspecified: Secondary | ICD-10-CM

## 2019-09-17 DIAGNOSIS — L03116 Cellulitis of left lower limb: Secondary | ICD-10-CM | POA: Diagnosis not present

## 2019-09-17 DIAGNOSIS — I110 Hypertensive heart disease with heart failure: Secondary | ICD-10-CM | POA: Diagnosis not present

## 2019-09-17 DIAGNOSIS — E1121 Type 2 diabetes mellitus with diabetic nephropathy: Secondary | ICD-10-CM

## 2019-09-17 DIAGNOSIS — S8012XD Contusion of left lower leg, subsequent encounter: Secondary | ICD-10-CM | POA: Diagnosis not present

## 2019-09-17 DIAGNOSIS — Z794 Long term (current) use of insulin: Secondary | ICD-10-CM

## 2019-09-17 MED ORDER — CEFTRIAXONE SODIUM 1 G IJ SOLR
1.0000 g | Freq: Once | INTRAMUSCULAR | Status: AC
  Administered 2019-09-17: 1 g via INTRAMUSCULAR

## 2019-09-17 NOTE — Progress Notes (Signed)
Stephen Nored T. Stephen Lamarca, MD Primary Care and Sports Medicine [facility] at [facility] [address] [place], [address] Phone: [phone]  FAX: [phone]  Stephen [name] - 74 y.o. male  MRN [id number]  Date of Birth: 14-Dec-1945  Visit Date: 09/17/2019  PCP: [name], MD  Referred by: [name], MD  Chief Complaint  Patient presents with  . Follow-up    Cellulitis Left Leg    This visit occurred during the SARS-CoV-2 public health emergency.  Safety protocols were in place, including screening questions prior to the visit, additional usage of staff PPE, and extensive cleaning of exam room while observing appropriate contact time as indicated for disinfecting solutions.   Subjective:   Stephen [name] is a 49 y.o. very pleasant male patient who presents with the following:  Date of injury September 04, 2019.  The patient initially had a hematoma and was seen by my partner Dr. Para March on September 10, 2019.  He had a tree limb fall on his leg and developed a hematoma that is approximately 7 cm across.  Currently this does still hurt to palpation and is obvious on exam.  He was initially placed on doxycycline and Keflex on September 09, 2019.  He subsequently saw Dr. Ermalene Searing on September 16, 2019.  At that point he got a gram of Rocephin in the office.  Doxycycline was continued.  Today, per the patient and his wife this is feeling somewhat better, but it still quite painful.  It does to appear to have receded somewhat compared to the line with permanent marker.  F/u L leg cellulitis:  S/p Rocephin yesterday at 09/16/2019  On Keflex and Doxy now.  Repeat Rocephin and f/u Friday with [facility]SD  Review of Systems is noted in the HPI, as appropriate  Objective:   BP 140/60   Pulse 74   Temp 98.6 F (37 C) (Temporal)   Ht 5\' 5"  (1.651 m)   Wt 268 lb 8 oz (121.8 kg)   SpO2 95% Comment: 3 L O2  BMI 44.68 kg/m   GEN: No acute distress;  alert,appropriate. PULM: Breathing comfortably in no respiratory distress PSYCH: Normally interactive.   Still tender on exam, modest recession of cellulitis compared to yesterday.        Laboratory and Imaging Data:  Assessment and Plan:     ICD-10-CM   1. Traumatic hematoma of left lower leg with infection, subsequent encounter  S80.12XD    L08.9   2. Left leg cellulitis  L03.116 cefTRIAXone (ROCEPHIN) injection 1 g  3. Accelerated hypertension with diastolic congestive heart failure, NYHA class 3 (HCC); elevated LVEDP on chronic catheterization.  I11.0    I50.30   4. Type 2 diabetes mellitus with diabetic nephropathy, with long-term current use of insulin (HCC)  E11.21    Z79.4    Level of Medical Decision-Making in this case is MODERATE.   Multiple increased risk factors. Rocephin 1 g IM again today.  Continue with doxycycline.  Elevate and cold.  Additional chronic Plavix use.  Follow-up: Return in 2 days (on 09/19/2019) for f/u cellulitis with Dr. Para March.  Meds ordered this encounter  Medications  . cefTRIAXone (ROCEPHIN) injection 1 g    Order Specific Question:   Antibiotic Indication:    Answer:   Cellulitis   There are no discontinued medications. No orders of the defined types were placed in this encounter.   Signed,  Elpidio Galea. Diany Formosa, MD  Outpatient Encounter Medications as of 09/17/2019  Medication Sig  . ACCU-CHEK SOFTCLIX LANCETS lancets Use as instructed to check blood sugar three times daily and as needed.  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  Marland Kitchen acetaminophen (TYLENOL) 325 MG tablet Take 2 tablets (650 mg total) by mouth every 6 (six) hours as needed for mild pain (or Fever >/= 101).  Marland Kitchen albuterol (VENTOLIN HFA) 108 (90 Base) MCG/ACT inhaler Inhale 2 puffs into the lungs every 6 (six) hours as needed for wheezing or shortness of breath. Dispense 3 inhalers.  . Alcohol Swabs (B-D SINGLE USE SWABS REGULAR) PADS Use to cleans area prior to  checking blood sugar three times daily and as needed.  Diagnosis:  E11.22  Insulin dependent.  Marland Kitchen amLODipine (NORVASC) 5 MG tablet Take 1 tablet (5 mg total) by mouth daily.  Marland Kitchen aspirin 81 MG tablet Take 81 mg by mouth daily.    Marland Kitchen atorvastatin (LIPITOR) 80 MG tablet TAKE 1 TABLET AT BEDTIME  . benzonatate (TESSALON) 100 MG capsule Take 1-2 capsules (100-200 mg total) by mouth 3 (three) times daily as needed.  . Blood Glucose Monitoring Suppl (ACCU-CHEK AVIVA PLUS) w/Device KIT Use to check blood sugar three times daily and as needed.  Diagnosis: E11.22  Insulin-dependent  . carvedilol (COREG) 12.5 MG tablet Take 1 tablet (12.5 mg total) by mouth 2 (two) times daily with a meal.  . cephALEXin (KEFLEX) 500 MG capsule Take 1 capsule (500 mg total) by mouth 3 (three) times daily.  . cloNIDine (CATAPRES) 0.3 MG tablet Take 1 tablet (0.3 mg total) by mouth 3 (three) times daily.  . clopidogrel (PLAVIX) 75 MG tablet Take 1 tablet (75 mg total) by mouth daily.  . diclofenac sodium (VOLTAREN) 1 % GEL Apply 2 g topically 2 (two) times daily as needed (for left knee pain.).  Marland Kitchen diphenhydrAMINE (BENADRYL) 50 MG tablet Take 50 mg by mouth at bedtime.   . docusate sodium (COLACE) 100 MG capsule Take 100 mg by mouth daily as needed for mild constipation.   Marland Kitchen erythromycin ophthalmic ointment Place into the left eye every 6 (six) hours.  . fluticasone furoate-vilanterol (BREO ELLIPTA) 100-25 MCG/INH AEPB Inhale 1 puff into the lungs daily.  Marland Kitchen gabapentin (NEURONTIN) 400 MG capsule Take 2 tabs in the AM and 3 at night.  Marland Kitchen glucose blood (ACCU-CHEK AVIVA PLUS) test strip Use as instructed to check blood sugar 3 times daily or as needed.  Diagnosis:  E11.22  Insulin-dependent.  . insulin NPH-regular Human (NOVOLIN 70/30) (70-30) 100 UNIT/ML injection Inject 50-70 Units into the skin See admin instructions. Inject 70u under the skin every morning and 40-45u under the skin at bedtime  . Insulin Syringe-Needle U-100 (INSULIN  SYRINGE 1CC/30GX5/16") 30G X 5/16" 1 ML MISC Use as directed to take insulin Dx 250.62  . isosorbide mononitrate (IMDUR) 60 MG 24 hr tablet Take 1.5 tablets (90 mg total) by mouth 2 (two) times daily.  Marland Kitchen levothyroxine (SYNTHROID) 25 MCG tablet Take 1 tablet (25 mcg total) by mouth daily before breakfast.  . nitroGLYCERIN (NITROSTAT) 0.4 MG SL tablet Place 1 tablet (0.4 mg total) under the tongue every 5 (five) minutes as needed. May repeat x3  . omeprazole (PRILOSEC) 20 MG capsule Take 20 mg by mouth daily.    Marland Kitchen oxyCODONE (ROXICODONE) 15 MG immediate release tablet Take 1 tablet (15 mg total) by mouth daily as needed for pain. Hold for patient to request rx  . polyethylene glycol powder (GLYCOLAX/MIRALAX) 17 GM/SCOOP powder Take 17  g by mouth daily as needed (for constipation).  . potassium chloride (K-DUR) 10 MEQ tablet Take 1-2 tablets (10-20 mEq total) by mouth as directed. Alternating every other day. Take 1 tablet (10 mEq) daily and 2 tablets (20 mEq) daily  . sildenafil (REVATIO) 20 MG tablet TAKE ONE TABLET BY MOUTH THREE TIMES A DAY  . tamsulosin (FLOMAX) 0.4 MG CAPS capsule Take 1 capsule (0.4 mg total) by mouth at bedtime.  Marland Kitchen ezetimibe (ZETIA) 10 MG tablet Take 1 tablet (10 mg total) by mouth daily.  Marland Kitchen torsemide (DEMADEX) 20 MG tablet Take 1-2 tablets (20-40 mg total) by mouth as directed. Alternate every other day. 1 tablet (20 mg) daily and 2 tablets (40 mg) daily  . [EXPIRED] cefTRIAXone (ROCEPHIN) injection 1 g    No facility-administered encounter medications on file as of 09/17/2019.

## 2019-09-19 ENCOUNTER — Encounter: Payer: Self-pay | Admitting: Family Medicine

## 2019-09-19 ENCOUNTER — Ambulatory Visit (INDEPENDENT_AMBULATORY_CARE_PROVIDER_SITE_OTHER): Payer: Medicare HMO | Admitting: Family Medicine

## 2019-09-19 ENCOUNTER — Other Ambulatory Visit: Payer: Self-pay

## 2019-09-19 VITALS — BP 120/60 | HR 73 | Temp 98.5°F | Ht 65.0 in | Wt 268.8 lb

## 2019-09-19 DIAGNOSIS — L03116 Cellulitis of left lower limb: Secondary | ICD-10-CM

## 2019-09-19 MED ORDER — DOXYCYCLINE HYCLATE 100 MG PO TABS: 100.0000 mg | ORAL_TABLET | Freq: Two times a day (BID) | ORAL | 0 refills | Status: DC

## 2019-09-19 NOTE — Progress Notes (Signed)
Chief Complaint  Patient presents with  . Follow-up    Cellulits Left Leg    History of Present Illness: HPI    74 year old male presents for follow up cellulitis, left lower leg at site of hematoma.  On 3/16  Appt.. no improvement in  Possible infected hematoma after treatment with doxy.  Started on cetriaxone rx Follow up on 3/17 showed improvement in redness.  Given additional rocephin injection.  Today he reports  Less swelling in left  Leg overall, redness, still fairly painful.     This visit occurred during the SARS-CoV-2 public health emergency.  Safety protocols were in place, including screening questions prior to the visit, additional usage of staff PPE, and extensive cleaning of exam room while observing appropriate contact time as indicated for disinfecting solutions.   COVID 19 screen:  No recent travel or known exposure to COVID19 The patient denies respiratory symptoms of COVID 19 at this time. The importance of social distancing was discussed today.     Review of Systems  Constitutional: Negative for chills and fever.  HENT: Negative for congestion and ear pain.   Eyes: Negative for pain and redness.  Respiratory: Negative for cough and shortness of breath.   Cardiovascular: Negative for chest pain, palpitations and leg swelling.  Gastrointestinal: Negative for abdominal pain, blood in stool, constipation, diarrhea, nausea and vomiting.  Genitourinary: Negative for dysuria.  Musculoskeletal: Negative for falls and myalgias.  Skin: Negative for rash.  Neurological: Negative for dizziness.  Psychiatric/Behavioral: Negative for depression. The patient is not nervous/anxious.       Past Medical History:  Diagnosis Date  . Adrenal gland anomaly   . Arthritis   . CAD (coronary artery disease)    a. 04/2006 MI and PCI/stenting to mLCx & mRCA; b. 07/2009 Cath: patent LCX/RCA stents;  c. 12/2016 NSTEMI/Cath: LM 30, LAD 30p, 38m 30d, D1/2/3 min irregs, LCX 1072mISR, OM1 min irregs, RCA 20p, 10 ISR, 7017m0d, RPDA min irregs, RPAV 40-->Med Rx; d. 12/2017 MV: fixed lateral wall scar, mild anterior/anterior septal ischemia. EF 30-44%.  . CHF (congestive heart failure) ([facility] . CKD (chronic kidney disease), stage III   . Colon polyps   . DM2 (diabetes mellitus, type 2) (HCC)    insulin requiring  . Dyslipidemia   . GERD (gastroesophageal reflux disease)   . History of echocardiogram    a. TTE 12/2016: EF 50-55%, mod concentric LVH, images inadequate for wall motion assessment, not technically sufficienct to allow for LV dias fxn, calcified mitral annulus   . HTN (hypertension)   . Kidney stones   . [place]al artery stenosis ([facility]  a. 07/2013 s/p PTA/stenting ([place]); b. 07/2017 Renal Duplex: No significant RAS.  . [place] ([facility] . OSA (obstructive sleep apnea)   . Pulmonary fibrosis ([facility]  a. 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  . Recurrent UTI   . Rotator cuff disorder    right  . Skin cancer    head  . Thyroid disorder   . Venous insufficiency of both lower extremities    a. s/p laser treatment.    reports that he has never smoked. He has never used smokeless tobacco. He reports that he does not drink alcohol or use drugs.   Current Outpatient Medications:  .  ACCU-CHEK SOFTCLIX LANCETS lancets, Use as instructed to check blood sugar three times daily and as needed.  Diagnosis:  E11.22  Insulin-dependent., Disp: 100 each, Rfl: 5 .  acetaminophen (TYLENOL) 325 MG tablet, Take 2 tablets (650 mg total) by mouth every 6 (six) hours as needed for mild pain (or Fever >/= 101)., Disp: , Rfl:  .  albuterol (VENTOLIN HFA) 108 (90 Base) MCG/ACT inhaler, Inhale 2 puffs into the lungs every 6 (six) hours as needed for wheezing or shortness of breath. Dispense 3 inhalers., Disp: 48 g, Rfl: 3 .  Alcohol Swabs (B-D SINGLE USE SWABS REGULAR) PADS, Use to cleans area prior to checking blood  sugar three times daily and as needed.  Diagnosis:  E11.22  Insulin dependent., Disp: 100 each, Rfl: 5 .  amLODipine (NORVASC) 5 MG tablet, Take 1 tablet (5 mg total) by mouth daily., Disp: 90 tablet, Rfl: 3 .  aspirin 81 MG tablet, Take 81 mg by mouth daily.  , Disp: , Rfl:  .  atorvastatin (LIPITOR) 80 MG tablet, TAKE 1 TABLET AT BEDTIME, Disp: 90 tablet, Rfl: 0 .  benzonatate (TESSALON) 100 MG capsule, Take 1-2 capsules (100-200 mg total) by mouth 3 (three) times daily as needed., Disp: 30 capsule, Rfl: 0 .  Blood Glucose Monitoring Suppl (ACCU-CHEK AVIVA PLUS) w/Device KIT, Use to check blood sugar three times daily and as needed.  Diagnosis: E11.22  Insulin-dependent, Disp: 1 kit, Rfl: 0 .  carvedilol (COREG) 12.5 MG tablet, Take 1 tablet (12.5 mg total) by mouth 2 (two) times daily with a meal., Disp: 180 tablet, Rfl: 3 .  cephALEXin (KEFLEX) 500 MG capsule, Take 1 capsule (500 mg total) by mouth 3 (three) times daily., Disp: 30 capsule, Rfl: 0 .  cloNIDine (CATAPRES) 0.3 MG tablet, Take 1 tablet (0.3 mg total) by mouth 3 (three) times daily., Disp: 270 tablet, Rfl: 3 .  clopidogrel (PLAVIX) 75 MG tablet, Take 1 tablet (75 mg total) by mouth daily., Disp: 90 tablet, Rfl: 1 .  diclofenac sodium (VOLTAREN) 1 % GEL, Apply 2 g topically 2 (two) times daily as needed (for left knee pain.)., Disp: 100 g, Rfl: 1 .  diphenhydrAMINE (BENADRYL) 50 MG tablet, Take 50 mg by mouth at bedtime. , Disp: , Rfl:  .  docusate sodium (COLACE) 100 MG capsule, Take 100 mg by mouth daily as needed for mild constipation. , Disp: , Rfl:  .  erythromycin ophthalmic ointment, Place into the left eye every 6 (six) hours., Disp: 3.5 g, Rfl: 0 .  fluticasone furoate-vilanterol (BREO ELLIPTA) 100-25 MCG/INH AEPB, Inhale 1 puff into the lungs daily., Disp: 28 each, Rfl: 11 .  gabapentin (NEURONTIN) 400 MG capsule, Take 2 tabs in the AM and 3 at night., Disp: 450 capsule, Rfl: 3 .  glucose blood (ACCU-CHEK AVIVA PLUS) test  strip, Use as instructed to check blood sugar 3 times daily or as needed.  Diagnosis:  E11.22  Insulin-dependent., Disp: 300 each, Rfl: 3 .  insulin NPH-regular Human (NOVOLIN 70/30) (70-30) 100 UNIT/ML injection, Inject 50-70 Units into the skin See admin instructions. Inject 70u under the skin every morning and 40-45u under the skin at bedtime, Disp: 10 mL, Rfl:  .  Insulin Syringe-Needle U-100 (INSULIN SYRINGE 1CC/30GX5/16") 30G X 5/16" 1 ML MISC, Use as directed to take insulin Dx 250.62, Disp: 180 each, Rfl: 3 .  isosorbide mononitrate (IMDUR) 60 MG 24 hr tablet, Take 1.5 tablets (90 mg total) by mouth 2 (two) times daily., Disp: 270 tablet, Rfl: 3 .  levothyroxine (SYNTHROID) 25 MCG tablet, Take 1 tablet (25 mcg total) by mouth daily before  breakfast., Disp: 90 tablet, Rfl: 3 .  nitroGLYCERIN (NITROSTAT) 0.4 MG SL tablet, Place 1 tablet (0.4 mg total) under the tongue every 5 (five) minutes as needed. May repeat x3, Disp: 25 tablet, Rfl: prn .  omeprazole (PRILOSEC) 20 MG capsule, Take 20 mg by mouth daily.  , Disp: , Rfl:  .  oxyCODONE (ROXICODONE) 15 MG immediate release tablet, Take 1 tablet (15 mg total) by mouth daily as needed for pain. Hold for patient to request rx, Disp: 30 tablet, Rfl: 0 .  polyethylene glycol powder (GLYCOLAX/MIRALAX) 17 GM/SCOOP powder, Take 17 g by mouth daily as needed (for constipation)., Disp: 255 g, Rfl: 1 .  potassium chloride (K-DUR) 10 MEQ tablet, Take 1-2 tablets (10-20 mEq total) by mouth as directed. Alternating every other day. Take 1 tablet (10 mEq) daily and 2 tablets (20 mEq) daily, Disp: 180 tablet, Rfl: 3 .  sildenafil (REVATIO) 20 MG tablet, TAKE ONE TABLET BY MOUTH THREE TIMES A DAY, Disp: 270 tablet, Rfl: 0 .  tamsulosin (FLOMAX) 0.4 MG CAPS capsule, Take 1 capsule (0.4 mg total) by mouth at bedtime., Disp: 90 capsule, Rfl: 3 .  ezetimibe (ZETIA) 10 MG tablet, Take 1 tablet (10 mg total) by mouth daily., Disp: 90 tablet, Rfl: 3 .  torsemide  (DEMADEX) 20 MG tablet, Take 1-2 tablets (20-40 mg total) by mouth as directed. Alternate every other day. 1 tablet (20 mg) daily and 2 tablets (40 mg) daily, Disp: 180 tablet, Rfl: 3   Observations/Objective: Blood pressure 120/60, pulse 73, temperature 98.5 F (36.9 C), temperature source Temporal, height '5\' 5"'$  (1.651 m), weight 268 lb 12 oz (121.9 kg), SpO2 96 %.  Physical Exam Constitutional:      Appearance: He is well-developed.  HENT:     Head: Normocephalic.     Right Ear: Hearing normal.     Left Ear: Hearing normal.     Nose: Nose normal.  Neck:     Thyroid: No thyroid mass or thyromegaly.     Vascular: No carotid bruit.     Trachea: Trachea normal.  Cardiovascular:     Rate and Rhythm: Normal rate and regular rhythm.     Pulses: Normal pulses.     Heart sounds: Heart sounds not distant. No murmur. No friction rub. No gallop.      Comments: No peripheral edema Pulmonary:     Effort: Pulmonary effort is normal. No respiratory distress.     Breath sounds: Normal breath sounds.  Skin:    General: Skin is warm and dry.     Findings: No rash. Urticarial rash: Less edema and less redness.. no spread of erythema beyod marker line.     Comments: Central dark red swollen area 10 x 8 Cm and slightly erythematous area 13 x9 cm surrounding.   2 plus pitting edema on left lower leg  Psychiatric:        Speech: Speech normal.        Behavior: Behavior normal.        Thought Content: Thought content normal.      Assessment and Plan   Left leg cellulitis Improving. Complete another 7 day course  of doxycycline and now start keflex as well.  Continue to elevate  Leg above heart.      Eliezer Lofts, MD

## 2019-09-19 NOTE — Patient Instructions (Addendum)
Complete another 7 day course  of doxycycline and now start keflex as well.  Continue to elevate  Leg above heart.

## 2019-09-21 NOTE — Progress Notes (Signed)
Date:  09/22/2019   ID:  Stephen [name], DOB Sep 02, 1945, MRN [id number]  Patient Location:  [facility] [place] [address]   Provider location:   [facility], [place] office  PCP:  [name], MD  Cardiologist:  Arvid Right [facility]  Chief Complaint  Patient presents with  . other    Occassional chest pain used nitro. Meds reviewed verbally with pt.     History of Present Illness:    Stephen [name] is a 74 y.o. 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 Status post stent to his left renal artery for 80+ percent stenosis several weeks ago, performed by Dr. Lucky Cowboy. laser treatment of the lower extremity veins for leg swelling/venous insufficiency who presents for routine followup of his coronary artery disease and shortness of breath  Typically wears compression hose Recently developed cellulitis left leg, on antibiotics, slowly improving Chronic swelling  Weight up 6 pounds over the past year  Chronic SOB 3 liters, 4 liters at home Followed by pulmonary, on Breo  totall cholo 125, LDL 69 lipitor and zetia  Lab work reviewed HBAC 7.1, previously 8.4 Eating better Non smoker  echocardiogram March 2020 ejection fraction 45 to 50% moderately elevated right heart pressures estimated at 50 mmHg  Creatinine stable around 2 Followed by nephrology  Stable chronic angina Previous recommendation not to walk around the house without his oxygen  Catheterization previously deferred secondary to renal dysfunction  Weight up 6 pounds from baseline  EKG personally reviewed by myself on todays visit Shows normal sinus rhythm  rate 55 bpm poor R wave progression through the anterior precordial leads, consider old inferior MI, anteroseptal MI  hospitalization March 2020 with acute respiratory distress O2 sat down to 80% on room air with oxygen walking around his house,  Syncope, fell through a wall  upper respiratory infection  short course of prednisone, made his sugars run high   Myoview in July 2019 showed 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 He has had a total of 6 shots so far.   Prior catheterization in July 2018 did show elevated left ventricular end-diastolic pressure Prior echocardiogram also with elevated pressure   Prior CV studies:   The following studies were reviewed today:  Stress test ordered for shortness of breath Mages pulled up and discussed with him  defect lateral wall Known occluded left circumflex vessel Ejection fraction 30-45%  Cath 01/11/2017, occluded LCX, with collaterals Distal RCA stenosis in-stent 70%, medical management at this time  Echocardiogram ejection fraction 50-55%  Stress test July 2011 was dobutamine stress that showed no significant ischemia, inadequate heart rate was achieved. right and left heart catheterization January 2011 showed 50% mid LAD disease, 60% at the ostium of the diagonal #2, 30% proximal left circumflex disease, patent stent in the left circumflex, patent stent of the mid RCA, wedge pressure of 16, PA pressure mean 22, right ventricular pressure 41/8 .  echocardiogram September 2010 shows normal systolic function, diastolic dysfunction, normal RV  function unable to evaluate right ventricular systolic pressures   Past Medical History:  Diagnosis Date  . Adrenal gland anomaly   . Arthritis   . CAD (coronary artery disease)    a. 04/2006 MI  and PCI/stenting to mLCx & mRCA; b. 07/2009 Cath: patent LCX/RCA stents;  c. 12/2016 NSTEMI/Cath: LM 30, LAD 30p, 23m 30d, D1/2/3 min irregs, LCX 108mSR, OM1 min irregs, RCA 20p, 10 ISR, 7066m0d, RPDA min irregs, RPAV 40-->Med Rx; d. 12/2017 MV: fixed lateral wall scar, mild anterior/anterior septal ischemia. EF 30-44%.  . CHF (congestive heart failure) ([place] . CKD (chronic kidney disease), stage III   . Colon polyps   . DM2 (diabetes mellitus, type 2) (HCC)    insulin requiring  . Dyslipidemia   . GERD (gastroesophageal reflux disease)   . History of echocardiogram    a. TTE 12/2016: EF 50-55%, mod concentric LVH, images inadequate for wall motion assessment, not technically sufficienct to allow for LV dias fxn, calcified mitral annulus   . HTN (hypertension)   . Kidney stones   . Left Renal artery stenosis ([place]  a. 07/2013 s/p PTA/stenting ([place]); b. 07/2017 Renal Duplex: No significant RAS.  . MMarland Kitchenocardial infarction ([facility] . OSA (obstructive sleep apnea)   . Pulmonary fibrosis ([place]  a. 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  . Recurrent UTI   . Rotator cuff disorder    right  . Skin cancer    head  . Thyroid disorder   . Venous insufficiency of both lower extremities    a. s/p laser treatment.   Past Surgical History:  Procedure Laterality Date  . adrenal adenoma removal  1990-   right  . CARPAL TUNNEL RELEASE     bilateral  . CATARACT EXTRACTION W/PHACO Right 07/08/2018   Procedure: CATARACT EXTRACTION PHACO AND INTRAOCULAR LENS PLACEMENT (IOC) RIGHT DIABETIC;  Surgeon: KinEulogio BearD;  [facility];  Laterality: Right;  Diabetic - insulin sleep apnea  . CATARACT EXTRACTION W/PHACO Left 08/05/2018   Procedure: CATARACT EXTRACTION PHACO AND INTRAOCULAR LENS PLACEMENT (IOCL[place]ton[place]FT DIABETIC;  Surgeon: KinEulogio BearD;  [place];   Laterality: Left;  DIABETIC  . CORONARY ANGIOPLASTY WITH STENT PLACEMENT  2007   x 2  . KNEE SURGERY     right  . LEFT HEART CATH AND CORONARY ANGIOGRAPHY N/A 01/11/2017   Procedure: Left Heart Cath and Coronary Angiography;  Surgeon: AriWellington HampshireD;  [place];  Service: Cardiovascular;  Laterality: N/A;  . LUNG BIOPSY    . RENAL ARTERY STENT  2015   L  . SKIN CANCER EXCISION     back of head     Current Meds  Medication Sig  . ACCU-CHEK SOFTCLIX LANCETS lancets Use as instructed to check blood sugar three times daily and as needed.  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  . aMarland Kitchenetaminophen (TYLENOL) 325 MG tablet Take 2 tablets (650 mg total) by mouth every 6 (six) hours as needed for mild pain (or Fever >/= 101).  . aMarland Kitchenbuterol (VENTOLIN HFA) 108 (90 Base) MCG/ACT inhaler Inhale 2 puffs into the lungs every 6 (six) hours as needed for wheezing or shortness of breath. Dispense 3 inhalers.  . Alcohol Swabs (B-D SINGLE USE SWABS REGULAR) PADS Use to cleans area prior to checking blood sugar three times daily and as needed.  Diagnosis:  E11.22  Insulin dependent.  Marland Kitchen amLODipine (NORVASC) 5 MG tablet Take 1 tablet (5 mg total) by mouth daily.  Marland Kitchen aspirin 81 MG tablet Take 81 mg by mouth daily.    Marland Kitchen atorvastatin (LIPITOR) 80 MG tablet TAKE 1 TABLET AT BEDTIME  . benzonatate (TESSALON) 100 MG capsule Take 1-2 capsules (100-200 mg total) by mouth 3 (three) times daily as needed.  . Blood Glucose Monitoring Suppl (ACCU-CHEK AVIVA PLUS) w/Device KIT Use to check blood sugar three times daily and as needed.  Diagnosis: E11.22  Insulin-dependent  . carvedilol (COREG) 12.5 MG tablet Take 1 tablet (12.5 mg total) by mouth 2 (two) times daily with a meal.  . cephALEXin (KEFLEX) 500 MG capsule Take 1 capsule (500 mg total) by mouth 3 (three) times daily.  . cloNIDine (CATAPRES) 0.3 MG tablet Take 1 tablet (0.3 mg total) by mouth 3 (three) times daily.  . clopidogrel (PLAVIX) 75 MG  tablet Take 1 tablet (75 mg total) by mouth daily.  . diclofenac sodium (VOLTAREN) 1 % GEL Apply 2 g topically 2 (two) times daily as needed (for left knee pain.).  Marland Kitchen diphenhydrAMINE (BENADRYL) 50 MG tablet Take 50 mg by mouth at bedtime.   . docusate sodium (COLACE) 100 MG capsule Take 100 mg by mouth daily as needed for mild constipation.   Marland Kitchen doxycycline (VIBRA-TABS) 100 MG tablet Take 1 tablet (100 mg total) by mouth 2 (two) times daily.  Marland Kitchen erythromycin ophthalmic ointment Place into the left eye every 6 (six) hours.  Marland Kitchen ezetimibe (ZETIA) 10 MG tablet Take 1 tablet (10 mg total) by mouth daily.  . fluticasone furoate-vilanterol (BREO ELLIPTA) 100-25 MCG/INH AEPB Inhale 1 puff into the lungs daily.  Marland Kitchen gabapentin (NEURONTIN) 400 MG capsule Take 2 tabs in the AM and 3 at night.  Marland Kitchen glucose blood (ACCU-CHEK AVIVA PLUS) test strip Use as instructed to check blood sugar 3 times daily or as needed.  Diagnosis:  E11.22  Insulin-dependent.  . insulin NPH-regular Human (NOVOLIN 70/30) (70-30) 100 UNIT/ML injection Inject 50-70 Units into the skin See admin instructions. Inject 70u under the skin every morning and 40-45u under the skin at bedtime  . Insulin Syringe-Needle U-100 (INSULIN SYRINGE 1CC/30GX5/16") 30G X 5/16" 1 ML MISC Use as directed to take insulin Dx 250.62  . isosorbide mononitrate (IMDUR) 60 MG 24 hr tablet Take 1.5 tablets (90 mg total) by mouth 2 (two) times daily.  Marland Kitchen levothyroxine (SYNTHROID) 25 MCG tablet Take 1 tablet (25 mcg total) by mouth daily before breakfast.  . nitroGLYCERIN (NITROSTAT) 0.4 MG SL tablet Place 1 tablet (0.4 mg total) under the tongue every 5 (five) minutes as needed. May repeat x3  . omeprazole (PRILOSEC) 20 MG capsule Take 20 mg by mouth daily.    Marland Kitchen oxyCODONE (ROXICODONE) 15 MG immediate release tablet Take 1 tablet (15 mg total) by mouth daily as needed for pain. Hold for patient to request rx  . polyethylene glycol powder (GLYCOLAX/MIRALAX) 17 GM/SCOOP powder  Take 17 g by mouth daily as needed (for constipation).  . potassium chloride (K-DUR) 10 MEQ tablet Take 1-2 tablets (10-20 mEq total) by mouth as directed. Alternating every other day. Take 1 tablet (10 mEq) daily and 2 tablets (20 mEq) daily  . sildenafil (REVATIO) 20 MG tablet TAKE ONE TABLET BY MOUTH THREE TIMES A DAY  . tamsulosin (FLOMAX) 0.4 MG CAPS capsule Take 1 capsule (0.4 mg total) by mouth at bedtime.  . torsemide (DEMADEX) 20 MG tablet  Take 1-2 tablets (20-40 mg total) by mouth as directed. Alternate every other day. 1 tablet (20 mg) daily and 2 tablets (40 mg) daily     Allergies:   Calcium channel blockers   Social History   Tobacco Use  . Smoking status: Never Smoker  . Smokeless tobacco: Never Used  Substance Use Topics  . Alcohol use: No    Alcohol/week: 0.0 standard drinks  . Drug use: No     Current Outpatient Medications on File Prior to Visit  Medication Sig Dispense Refill  . ACCU-CHEK SOFTCLIX LANCETS lancets Use as instructed to check blood sugar three times daily and as needed.  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 100 each 5  . acetaminophen (TYLENOL) 325 MG tablet Take 2 tablets (650 mg total) by mouth every 6 (six) hours as needed for mild pain (or Fever >/= 101).    Marland Kitchen albuterol (VENTOLIN HFA) 108 (90 Base) MCG/ACT inhaler Inhale 2 puffs into the lungs every 6 (six) hours as needed for wheezing or shortness of breath. Dispense 3 inhalers. 48 g 3  . Alcohol Swabs (B-D SINGLE USE SWABS REGULAR) PADS Use to cleans area prior to checking blood sugar three times daily and as needed.  Diagnosis:  E11.22  Insulin dependent. 100 each 5  . amLODipine (NORVASC) 5 MG tablet Take 1 tablet (5 mg total) by mouth daily. 90 tablet 3  . aspirin 81 MG tablet Take 81 mg by mouth daily.      Marland Kitchen atorvastatin (LIPITOR) 80 MG tablet TAKE 1 TABLET AT BEDTIME 90 tablet 0  . benzonatate (TESSALON) 100 MG capsule Take 1-2 capsules (100-200 mg total) by mouth 3 (three) times daily as  needed. 30 capsule 0  . Blood Glucose Monitoring Suppl (ACCU-CHEK AVIVA PLUS) w/Device KIT Use to check blood sugar three times daily and as needed.  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 carvedilol (COREG) 12.5 MG tablet Take 1 tablet (12.5 mg total) by mouth 2 (two) times daily with a meal. 180 tablet 3  . cephALEXin (KEFLEX) 500 MG capsule Take 1 capsule (500 mg total) by mouth 3 (three) times daily. 30 capsule 0  . cloNIDine (CATAPRES) 0.3 MG tablet Take 1 tablet (0.3 mg total) by mouth 3 (three) times daily. 270 tablet 3  . clopidogrel (PLAVIX) 75 MG tablet Take 1 tablet (75 mg total) by mouth daily. 90 tablet 1  . diclofenac sodium (VOLTAREN) 1 % GEL Apply 2 g topically 2 (two) times daily as needed (for left knee pain.). 100 g 1  . diphenhydrAMINE (BENADRYL) 50 MG tablet Take 50 mg by mouth at bedtime.     . docusate sodium (COLACE) 100 MG capsule Take 100 mg by mouth daily as needed for mild constipation.     Marland Kitchen doxycycline (VIBRA-TABS) 100 MG tablet Take 1 tablet (100 mg total) by mouth 2 (two) times daily. 14 tablet 0  . erythromycin ophthalmic ointment Place into the left eye every 6 (six) hours. 3.5 g 0  . ezetimibe (ZETIA) 10 MG tablet Take 1 tablet (10 mg total) by mouth daily. 90 tablet 3  . fluticasone furoate-vilanterol (BREO ELLIPTA) 100-25 MCG/INH AEPB Inhale 1 puff into the lungs daily. 28 each 11  . gabapentin (NEURONTIN) 400 MG capsule Take 2 tabs in the AM and 3 at night. 450 capsule 3  . glucose blood (ACCU-CHEK AVIVA PLUS) test strip Use as instructed to check blood sugar 3 times daily or as needed.  Diagnosis:  E11.22  Insulin-dependent. 300 each 3  . insulin NPH-regular Human (NOVOLIN 70/30) (70-30) 100 UNIT/ML injection Inject 50-70 Units into the skin See admin instructions. Inject 70u under the skin every morning and 40-45u under the skin at bedtime 10 mL   . Insulin Syringe-Needle U-100 (INSULIN SYRINGE 1CC/30GX5/16") 30G X 5/16" 1 ML MISC Use as directed to  take insulin Dx 250.62 180 each 3  . isosorbide mononitrate (IMDUR) 60 MG 24 hr tablet Take 1.5 tablets (90 mg total) by mouth 2 (two) times daily. 270 tablet 3  . levothyroxine (SYNTHROID) 25 MCG tablet Take 1 tablet (25 mcg total) by mouth daily before breakfast. 90 tablet 3  . nitroGLYCERIN (NITROSTAT) 0.4 MG SL tablet Place 1 tablet (0.4 mg total) under the tongue every 5 (five) minutes as needed. May repeat x3 25 tablet prn  . omeprazole (PRILOSEC) 20 MG capsule Take 20 mg by mouth daily.      Marland Kitchen oxyCODONE (ROXICODONE) 15 MG immediate release tablet Take 1 tablet (15 mg total) by mouth daily as needed for pain. Hold for patient to request rx 30 tablet 0  . polyethylene glycol powder (GLYCOLAX/MIRALAX) 17 GM/SCOOP powder Take 17 g by mouth daily as needed (for constipation). 255 g 1  . potassium chloride (K-DUR) 10 MEQ tablet Take 1-2 tablets (10-20 mEq total) by mouth as directed. Alternating every other day. Take 1 tablet (10 mEq) daily and 2 tablets (20 mEq) daily 180 tablet 3  . sildenafil (REVATIO) 20 MG tablet TAKE ONE TABLET BY MOUTH THREE TIMES A DAY 270 tablet 0  . tamsulosin (FLOMAX) 0.4 MG CAPS capsule Take 1 capsule (0.4 mg total) by mouth at bedtime. 90 capsule 3  . torsemide (DEMADEX) 20 MG tablet Take 1-2 tablets (20-40 mg total) by mouth as directed. Alternate every other day. 1 tablet (20 mg) daily and 2 tablets (40 mg) daily 180 tablet 3   No current facility-administered medications on file prior to visit.     Family Hx: The patient's family history includes Arthritis in his maternal grandmother; Breast cancer in his maternal aunt; COPD in his sister; Diabetes in his maternal aunt; Heart attack in his brother, father, and mother; Hypertension in his maternal aunt and another family member. There is no history of Prostate cancer, Kidney cancer, Bladder Cancer, or Colon cancer.  ROS:   Please see the history of present illness.    Review of Systems  Constitutional: Negative.    HENT: Negative.   Respiratory: Positive for shortness of breath.   Cardiovascular: Positive for leg swelling.  Gastrointestinal: Negative.   Musculoskeletal: Negative.   Neurological: Negative.   Psychiatric/Behavioral: Negative.   All other systems reviewed and are negative.    Labs/Other Tests and Data Reviewed:    Recent Labs: 12/23/2018: ALT 13 07/01/2019: BUN 28; Creatinine, Ser 2.15; Potassium 3.9; Sodium 142 09/09/2019: TSH <0.01 Repeated and verified X2.   Recent Lipid Panel Lab Results  Component Value Date/Time   CHOL 125 12/23/2018 09:17 AM   TRIG 142.0 12/23/2018 09:17 AM   HDL 27.70 (L) 12/23/2018 09:17 AM   CHOLHDL 5 12/23/2018 09:17 AM   LDLCALC 69 12/23/2018 09:17 AM    Wt Readings from Last 3 Encounters:  09/22/19 268 lb 8 oz (121.8 kg)  09/19/19 268 lb 12 oz (121.9 kg)  09/17/19 268 lb 8 oz (121.8 kg)     Exam:    Vital Signs: Vital signs may also be detailed in the HPI BP 112/60 (BP Location: Left Arm, Patient  Position: Sitting, Cuff Size: Large)   Pulse (!) 55   Ht '5\' 5"'$  (1.651 m)   Wt 268 lb 8 oz (121.8 kg)   SpO2 99%   BMI 44.68 kg/m   Constitutional:  oriented to person, place, and time. No distress.  HENT:  Head: Grossly normal Eyes:  no discharge. No scleral icterus.  Neck: No JVD, no carotid bruits  Cardiovascular: Regular rate and rhythm, no murmurs appreciated Pulmonary/Chest: Decreased breath sounds throughout, scattered Rales Abdominal: Soft.  no distension.  no tenderness.  Musculoskeletal: Normal range of motion Neurological:  normal muscle tone. Coordination normal. No atrophy Skin: Skin warm and dry Psychiatric: normal affect, pleasant   ASSESSMENT & PLAN:    Pulmonary hypertension, primary ([place]) Secondary to interstitial lung disease Continued shortness of breath, on 3 to 4 L, Breo Followed by pulmonary Prior echocardiogram  with moderately elevated right heart pressures Continue alternating doses of torsemide, renal  function stable at high end of his range, labs reviewed  Morbid obesity (HCC) Weight 6 pounds higher, recommended strict diet for weight loss to improve his breathing.  Unable to exercise, will have to do this through dietary changes  Chronic diastolic congestive heart failure (HCC) Creatinine high end of his range, continue current torsemide regimen  Coronary artery disease involving native coronary artery of native heart with angina pectoris ([place]) Currently with no symptoms of angina. No further workup at this time. Continue current medication regimen.  PULMONARY FIBROSIS Followed by pulmonary On oxygen 3 to 4 L  Essential hypertension, benign Blood pressure is well controlled on today's visit. No changes made to the medications.  Blood pressure low, we will hold the amlodipine given the leg swelling    Total encounter time more than 25 minutes  Greater than 50% was spent in counseling and coordination of care with the patient   Disposition: Follow-up in 6 months   Signed, Ida Rogue, MD  09/22/2019 12:10 PM    [place] [facility] [id number], [place],  [address]

## 2019-09-22 ENCOUNTER — Ambulatory Visit (INDEPENDENT_AMBULATORY_CARE_PROVIDER_SITE_OTHER): Payer: Medicare HMO | Admitting: Cardiovascular Disease

## 2019-09-22 ENCOUNTER — Other Ambulatory Visit: Payer: Self-pay

## 2019-09-22 ENCOUNTER — Encounter: Payer: Self-pay | Admitting: Cardiovascular Disease

## 2019-09-22 VITALS — BP 112/60 | HR 55 | Ht 65.0 in | Wt 268.5 lb

## 2019-09-22 DIAGNOSIS — I5032 Chronic diastolic (congestive) heart failure: Secondary | ICD-10-CM

## 2019-09-22 DIAGNOSIS — I25118 Atherosclerotic heart disease of native coronary artery with other forms of angina pectoris: Secondary | ICD-10-CM | POA: Diagnosis not present

## 2019-09-22 DIAGNOSIS — N1832 Chronic kidney disease, stage 3b: Secondary | ICD-10-CM | POA: Diagnosis not present

## 2019-09-22 DIAGNOSIS — E1122 Type 2 diabetes mellitus with diabetic chronic kidney disease: Secondary | ICD-10-CM | POA: Diagnosis not present

## 2019-09-22 DIAGNOSIS — R600 Localized edema: Secondary | ICD-10-CM | POA: Diagnosis not present

## 2019-09-22 DIAGNOSIS — I270 Primary pulmonary hypertension: Secondary | ICD-10-CM

## 2019-09-22 DIAGNOSIS — N183 Chronic kidney disease, stage 3 unspecified: Secondary | ICD-10-CM | POA: Diagnosis not present

## 2019-09-22 DIAGNOSIS — N2581 Secondary hyperparathyroidism of renal origin: Secondary | ICD-10-CM | POA: Diagnosis not present

## 2019-09-22 DIAGNOSIS — I701 Atherosclerosis of renal artery: Secondary | ICD-10-CM | POA: Diagnosis not present

## 2019-09-22 DIAGNOSIS — I10 Essential (primary) hypertension: Secondary | ICD-10-CM

## 2019-09-22 NOTE — Patient Instructions (Signed)

## 2019-09-25 DIAGNOSIS — J449 Chronic obstructive pulmonary disease, unspecified: Secondary | ICD-10-CM | POA: Diagnosis not present

## 2019-09-29 ENCOUNTER — Ambulatory Visit: Payer: Medicare HMO | Admitting: Family Medicine

## 2019-10-06 ENCOUNTER — Other Ambulatory Visit: Payer: Self-pay | Admitting: Family Medicine

## 2019-10-06 ENCOUNTER — Other Ambulatory Visit: Payer: Self-pay | Admitting: Cardiovascular Disease

## 2019-10-07 ENCOUNTER — Other Ambulatory Visit: Payer: Medicare HMO

## 2019-10-07 ENCOUNTER — Other Ambulatory Visit: Payer: Self-pay | Admitting: *Deleted

## 2019-10-07 MED ORDER — "INSULIN SYRINGE 30G X 5/16"" 1 ML MISC": 3 refills | Status: DC

## 2019-10-09 ENCOUNTER — Other Ambulatory Visit: Payer: Self-pay | Admitting: Cardiovascular Disease

## 2019-10-17 ENCOUNTER — Other Ambulatory Visit: Payer: Self-pay | Admitting: *Deleted

## 2019-10-17 MED ORDER — "INSULIN SYRINGE 30G X 5/16"" 1 ML MISC": 3 refills | Status: DC

## 2019-10-18 NOTE — Assessment & Plan Note (Signed)
Improving. Complete another 7 day course  of doxycycline and now start keflex as well.  Continue to elevate  Leg above heart.

## 2019-10-20 ENCOUNTER — Other Ambulatory Visit: Payer: Self-pay | Admitting: Family Medicine

## 2019-10-20 DIAGNOSIS — Z794 Long term (current) use of insulin: Secondary | ICD-10-CM

## 2019-10-20 DIAGNOSIS — N183 Chronic kidney disease, stage 3 unspecified: Secondary | ICD-10-CM

## 2019-10-20 DIAGNOSIS — I5032 Chronic diastolic (congestive) heart failure: Secondary | ICD-10-CM

## 2019-10-22 ENCOUNTER — Telehealth: Payer: Self-pay | Admitting: Family Medicine

## 2019-10-22 NOTE — Progress Notes (Signed)
°  Chronic Care Management   Outreach Note  10/22/2019 Name: Stephen [name] MRN: [id number] DOB: [birth year]  Referred by: [name], MD Reason for referral : No chief complaint on file.   An unsuccessful telephone outreach was attempted today. The patient was referred to the pharmacist for assistance with care management and care coordination.   Follow Up Plan:   Raynicia Dukes [employer] Scheduler

## 2019-10-26 DIAGNOSIS — J449 Chronic obstructive pulmonary disease, unspecified: Secondary | ICD-10-CM | POA: Diagnosis not present

## 2019-10-28 ENCOUNTER — Telehealth: Payer: Self-pay | Admitting: Family Medicine

## 2019-10-28 NOTE — Progress Notes (Signed)
  Chronic Care Management   Note  10/28/2019 Name: EQUAN COGBILL MRN: [id number] DOB: 10/26/1945  KRYSTAL DELDUCA is a 74 y.o. year old male who is a primary care patient of [name], MD. I reached out to [name] by phone today in response to a referral sent by Mr. Racheal Patches PCP, [name], MD.   Mr. Borman was given information about Chronic Care Management services today including:  1. CCM service includes personalized support from designated clinical staff supervised by his physician, including individualized plan of care and coordination with other care providers 2. 24/7 contact phone numbers for assistance for urgent and routine care needs. 3. Service will only be billed when office clinical staff spend 20 minutes or more in a month to coordinate care. 4. Only one practitioner may furnish and bill the service in a calendar month. 5. The patient may stop CCM services at any time (effective at the end of the month) by phone call to the office staff.   Patient agreed to services and verbal consent obtained.    This note is not being shared with the patient for the following reason: To respect privacy (The patient or proxy has requested that the information not be shared).  Follow up plan:   Raynicia Dukes [employer] Scheduler

## 2019-10-30 ENCOUNTER — Other Ambulatory Visit: Payer: Self-pay

## 2019-10-30 ENCOUNTER — Encounter: Payer: Self-pay | Admitting: Pulmonary Disease

## 2019-10-30 ENCOUNTER — Ambulatory Visit: Payer: Medicare HMO | Admitting: Pulmonary Disease

## 2019-10-30 VITALS — BP 140/75 | HR 60 | Temp 98.2°F | Ht 65.0 in | Wt 269.0 lb

## 2019-10-30 DIAGNOSIS — G4733 Obstructive sleep apnea (adult) (pediatric): Secondary | ICD-10-CM

## 2019-10-30 DIAGNOSIS — Z9989 Dependence on other enabling machines and devices: Secondary | ICD-10-CM

## 2019-10-30 MED ORDER — TRELEGY ELLIPTA 200-62.5-25 MCG/INH IN AEPB: 1.0000 | INHALATION_SPRAY | Freq: Every day | RESPIRATORY_TRACT | 11 refills | Status: DC

## 2019-10-30 NOTE — Progress Notes (Signed)
 Assessment & Plan:  1. OSA on CPAP (Primary)   Patient Instructions  I have sent a new prescription for Trelegy which is a new inhaler.  This will replace Breo.   We will see him in follow-up in 3 months time call sooner should any new difficulties arise.  Please note: late entry documentation due to logistical difficulties during COVID-19 pandemic. This note is filed for information purposes only, and is not intended to be used for billing, nor does it represent the full scope/nature of the visit in question. Please see any associated scanned media linked to date of encounter for additional pertinent information.  Subjective:    HPI: Stephen [name] is a 74 y.o. male presenting to the pulmonology clinic on 10/30/2019 with report of: Shortness of Breath (About same, no worse)     Outpatient Encounter Medications as of 10/30/2019  Medication Sig   ACCU-CHEK SOFTCLIX LANCETS lancets Use as instructed to check blood sugar three times daily and as needed.  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   Blood Glucose Monitoring Suppl (ACCU-CHEK AVIVA PLUS) w/Device KIT Use to check blood sugar three times daily and as needed.  Diagnosis: E11.22  Insulin -dependent   nitroGLYCERIN  (NITROSTAT ) 0.4 MG SL tablet Place 1 tablet (0.4 mg total) under the tongue every 5 (five) minutes as needed. May repeat x3   omeprazole (PRILOSEC) 20 MG capsule Take 20 mg by mouth daily before breakfast.   [DISCONTINUED] acetaminophen  (TYLENOL ) 325 MG tablet Take 2 tablets (650 mg total) by mouth every 6 (six) hours as needed for mild pain (or Fever >/= 101).   [DISCONTINUED] albuterol  (VENTOLIN  HFA) 108 (90 Base) MCG/ACT inhaler Inhale 2 puffs into the lungs every 6 (six) hours as needed for wheezing or shortness of breath. Dispense 3 inhalers.   [DISCONTINUED] Alcohol  Swabs (B-D SINGLE USE SWABS REGULAR) PADS Use to cleans area prior to checking blood sugar three times daily and as needed.  Diagnosis:  E11.22   Insulin  dependent.   [DISCONTINUED] amLODipine  (NORVASC ) 5 MG tablet Take 1 tablet (5 mg total) by mouth daily.   [DISCONTINUED] aspirin  81 MG tablet Take 81 mg by mouth daily.   [DISCONTINUED] atorvastatin  (LIPITOR ) 80 MG tablet TAKE 1 TABLET AT BEDTIME   [DISCONTINUED] benzonatate  (TESSALON ) 100 MG capsule Take 1-2 capsules (100-200 mg total) by mouth 3 (three) times daily as needed. (Patient not taking: Reported on 07/26/2020)   [DISCONTINUED] carvedilol  (COREG ) 12.5 MG tablet Take 1 tablet (12.5 mg total) by mouth 2 (two) times daily with a meal.   [DISCONTINUED] cephALEXin  (KEFLEX ) 500 MG capsule Take 1 capsule (500 mg total) by mouth 3 (three) times daily.   [DISCONTINUED] cloNIDine  (CATAPRES ) 0.3 MG tablet TAKE 1 TABLET THREE TIMES DAILY   [DISCONTINUED] clopidogrel  (PLAVIX ) 75 MG tablet Take 1 tablet (75 mg total) by mouth daily.   [DISCONTINUED] diclofenac  sodium (VOLTAREN ) 1 % GEL Apply 2 g topically 2 (two) times daily as needed (for left knee pain.).   [DISCONTINUED] diphenhydrAMINE  (BENADRYL ) 50 MG tablet Take 50 mg by mouth at bedtime as needed for allergies. (Patient not taking: Reported on 04/18/2021)   [DISCONTINUED] docusate sodium  (COLACE) 100 MG capsule Take 100 mg by mouth daily as needed for mild constipation.    [DISCONTINUED] doxycycline  (VIBRA -TABS) 100 MG tablet Take 1 tablet (100 mg total) by mouth 2 (two) times daily.   [DISCONTINUED] erythromycin  ophthalmic ointment Place into the left eye every 6 (six) hours.   [DISCONTINUED] ezetimibe  (ZETIA ) 10 MG tablet TAKE 1  TABLET EVERY DAY   [DISCONTINUED] fluticasone  furoate-vilanterol (BREO ELLIPTA ) 100-25 MCG/INH AEPB Inhale 1 puff into the lungs daily.   [DISCONTINUED] Fluticasone -Umeclidin-Vilant (TRELEGY ELLIPTA ) 200-62.5-25 MCG/INH AEPB Inhale 1 puff into the lungs daily. (Patient taking differently: Inhale 1 puff into the lungs in the morning.)   [DISCONTINUED] gabapentin  (NEURONTIN ) 400 MG capsule Take 2 tabs in the AM  and 3 at night.   [DISCONTINUED] glucose blood (ACCU-CHEK AVIVA PLUS) test strip Use as instructed to check blood sugar 3 times daily or as needed.  Diagnosis:  E11.22  Insulin -dependent.   [DISCONTINUED] insulin  NPH-regular Human (NOVOLIN  70/30) (70-30) 100 UNIT/ML injection Inject 50-70 Units into the skin See admin instructions. Inject 70u under the skin every morning and 40-45u under the skin at bedtime   [DISCONTINUED] Insulin  Syringe-Needle U-100 (INSULIN  SYRINGE 1CC/30GX5/16) 30G X 5/16 1 ML MISC Use as directed to take insulin  Dx E11.9   [DISCONTINUED] isosorbide  mononitrate (IMDUR ) 60 MG 24 hr tablet TAKE 1 AND 1/2 TABLETS TWICE DAILY   [DISCONTINUED] levothyroxine  (SYNTHROID ) 25 MCG tablet Take 1 tablet (25 mcg total) by mouth daily before breakfast.   [DISCONTINUED] oxyCODONE  (ROXICODONE ) 15 MG immediate release tablet Take 1 tablet (15 mg total) by mouth daily as needed for pain. Hold for patient to request rx   [DISCONTINUED] polyethylene glycol powder (GLYCOLAX /MIRALAX ) 17 GM/SCOOP powder Take 17 g by mouth daily as needed (for constipation).   [DISCONTINUED] potassium chloride  (K-DUR) 10 MEQ tablet Take 1-2 tablets (10-20 mEq total) by mouth as directed. Alternating every other day. Take 1 tablet (10 mEq) daily and 2 tablets (20 mEq) daily   [DISCONTINUED] sildenafil  (REVATIO ) 20 MG tablet TAKE ONE TABLET BY MOUTH THREE TIMES A DAY   [DISCONTINUED] tamsulosin  (FLOMAX ) 0.4 MG CAPS capsule Take 1 capsule (0.4 mg total) by mouth at bedtime.   [DISCONTINUED] torsemide  (DEMADEX ) 20 MG tablet Take 1-2 tablets (20-40 mg total) by mouth as directed. Alternate every other day. 1 tablet (20 mg) daily and 2 tablets (40 mg) daily   No facility-administered encounter medications on file as of 10/30/2019.      Objective:   Vitals:   10/30/19 1130  BP: 140/75  Pulse: 60  Temp: 98.2 F (36.8 C)  Height: 5' 5 (1.651 m)  Weight: 269 lb (122 kg)  SpO2: 97% Comment: O2 @ 2lpm  BMI  (Calculated): 44.76     Physical exam documentation is limited by delayed entry of information.

## 2019-10-30 NOTE — Patient Instructions (Signed)
I have sent a new prescription for Trelegy which is a new inhaler.  This will replace Breo.   We will see him in follow-up in 3 months time call sooner should any new difficulties arise.

## 2019-11-12 ENCOUNTER — Other Ambulatory Visit: Payer: Self-pay

## 2019-11-12 NOTE — Patient Outreach (Signed)
[facility]) Care Management  11/12/2019  Stephen [name] Feb 04, 1946 [id number]   Telephone Assessment    Upon chart review, noted that patient has enrolled in CCM through PCP office.      Plan: RN CM will close case at this time.    Enzo Montgomery, RN,BSN,CCM [place] [facility] Telephonic Care Management Coordinator Direct Phone: [phone] Toll Free: [phone] Fax: [phone]

## 2019-11-25 DIAGNOSIS — J449 Chronic obstructive pulmonary disease, unspecified: Secondary | ICD-10-CM | POA: Diagnosis not present

## 2019-12-04 ENCOUNTER — Ambulatory Visit: Payer: Medicare HMO

## 2019-12-04 ENCOUNTER — Other Ambulatory Visit: Payer: Self-pay

## 2019-12-04 DIAGNOSIS — E1121 Type 2 diabetes mellitus with diabetic nephropathy: Secondary | ICD-10-CM

## 2019-12-04 DIAGNOSIS — Z794 Long term (current) use of insulin: Secondary | ICD-10-CM

## 2019-12-04 DIAGNOSIS — I10 Essential (primary) hypertension: Secondary | ICD-10-CM

## 2019-12-04 NOTE — Chronic Care Management (AMB) (Signed)
Chronic Care Management Pharmacy  Name: Stephen [name]  MRN: [id number] DOB: 03-23-1946  Chief Complaint/ HPI  Stephen [name],  74 y.o., male presents for their Initial CCM visit with the clinical pharmacist via telephone.  PCP : [name], MD  Their chronic conditions include: CHF, CAD, HTN, sleep apnea, asthma, type 2 DM, hypothyroidism, low back pain, CKD stage 3, hyperlipidemia  Patient concerns: denies medication concerns   Office Visits:  09/19/19: [name] - left leg cellulitis follow up, improving after ceftriaxone x 2, complete another 7 day course doxycycline, start keflex as well, elevate leg  09/09/19: [name] - left leg cellulitis, start doxycycline  06/30/20: [name] - reduce PM insulin to 45 units, then 40 units if nighttime lows continue  Consult Visit:  10/30/19: Pulmonology - replace Breo with Trelegy  09/22/19: Cardiology - occasional chest pain, uses nitro, BP low, hold amlodipine   09/22/19: Nephrology - note unavailable  08/27/19: Comprehensive eye exam  Allergies  Allergen Reactions  . Calcium Channel Blockers     Would avoid if possible due to h/o peripheral edema   Medications: Outpatient Encounter Medications as of 12/04/2019  Medication Sig  . ACCU-CHEK SOFTCLIX LANCETS lancets Use as instructed to check blood sugar three times daily and as needed.  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  Marland Kitchen acetaminophen (TYLENOL) 325 MG tablet Take 2 tablets (650 mg total) by mouth every 6 (six) hours as needed for mild pain (or Fever >/= 101).  Marland Kitchen albuterol (VENTOLIN HFA) 108 (90 Base) MCG/ACT inhaler Inhale 2 puffs into the lungs every 6 (six) hours as needed for wheezing or shortness of breath. Dispense 3 inhalers.  . Alcohol Swabs (B-D SINGLE USE SWABS REGULAR) PADS Use to cleans area prior to checking blood sugar three times daily and as needed.  Diagnosis:  E11.22  Insulin dependent.  Marland Kitchen amLODipine (NORVASC) 5 MG tablet Take 1 tablet (5 mg total) by mouth  daily.  Marland Kitchen aspirin 81 MG tablet Take 81 mg by mouth daily.    Marland Kitchen atorvastatin (LIPITOR) 80 MG tablet TAKE 1 TABLET AT BEDTIME  . benzonatate (TESSALON) 100 MG capsule Take 1-2 capsules (100-200 mg total) by mouth 3 (three) times daily as needed.  . Blood Glucose Monitoring Suppl (ACCU-CHEK AVIVA PLUS) w/Device KIT Use to check blood sugar three times daily and as needed.  Diagnosis: E11.22  Insulin-dependent  . carvedilol (COREG) 12.5 MG tablet Take 1 tablet (12.5 mg total) by mouth 2 (two) times daily with a meal.  . cephALEXin (KEFLEX) 500 MG capsule Take 1 capsule (500 mg total) by mouth 3 (three) times daily.  . cloNIDine (CATAPRES) 0.3 MG tablet TAKE 1 TABLET THREE TIMES DAILY  . clopidogrel (PLAVIX) 75 MG tablet Take 1 tablet (75 mg total) by mouth daily.  . diclofenac sodium (VOLTAREN) 1 % GEL Apply 2 g topically 2 (two) times daily as needed (for left knee pain.).  Marland Kitchen diphenhydrAMINE (BENADRYL) 50 MG tablet Take 50 mg by mouth at bedtime.   . docusate sodium (COLACE) 100 MG capsule Take 100 mg by mouth daily as needed for mild constipation.   Marland Kitchen doxycycline (VIBRA-TABS) 100 MG tablet Take 1 tablet (100 mg total) by mouth 2 (two) times daily.  Marland Kitchen erythromycin ophthalmic ointment Place into the left eye every 6 (six) hours.  Marland Kitchen ezetimibe (ZETIA) 10 MG tablet TAKE 1 TABLET EVERY DAY  . Fluticasone-Umeclidin-Vilant (TRELEGY ELLIPTA) 200-62.5-25 MCG/INH AEPB Inhale 1 puff into the lungs daily.  Marland Kitchen gabapentin (NEURONTIN) 400 MG  capsule Take 2 tabs in the AM and 3 at night.  Marland Kitchen glucose blood (ACCU-CHEK AVIVA PLUS) test strip Use as instructed to check blood sugar 3 times daily or as needed.  Diagnosis:  E11.22  Insulin-dependent.  . insulin NPH-regular Human (NOVOLIN 70/30) (70-30) 100 UNIT/ML injection Inject 50-70 Units into the skin See admin instructions. Inject 70u under the skin every morning and 40-45u under the skin at bedtime  . Insulin Syringe-Needle U-100 (INSULIN SYRINGE 1CC/30GX5/16") 30G X  5/16" 1 ML MISC Use as directed to take insulin Dx E11.9  . isosorbide mononitrate (IMDUR) 60 MG 24 hr tablet TAKE 1 AND 1/2 TABLETS TWICE DAILY  . nitroGLYCERIN (NITROSTAT) 0.4 MG SL tablet Place 1 tablet (0.4 mg total) under the tongue every 5 (five) minutes as needed. May repeat x3  . omeprazole (PRILOSEC) 20 MG capsule Take 20 mg by mouth daily.    Marland Kitchen oxyCODONE (ROXICODONE) 15 MG immediate release tablet Take 1 tablet (15 mg total) by mouth daily as needed for pain. Hold for patient to request rx  . polyethylene glycol powder (GLYCOLAX/MIRALAX) 17 GM/SCOOP powder Take 17 g by mouth daily as needed (for constipation).  . potassium chloride (K-DUR) 10 MEQ tablet Take 1-2 tablets (10-20 mEq total) by mouth as directed. Alternating every other day. Take 1 tablet (10 mEq) daily and 2 tablets (20 mEq) daily  . sildenafil (REVATIO) 20 MG tablet TAKE ONE TABLET BY MOUTH THREE TIMES A DAY  . tamsulosin (FLOMAX) 0.4 MG CAPS capsule Take 1 capsule (0.4 mg total) by mouth at bedtime.  . torsemide (DEMADEX) 20 MG tablet Take 1-2 tablets (20-40 mg total) by mouth as directed. Alternate every other day. 1 tablet (20 mg) daily and 2 tablets (40 mg) daily  . [DISCONTINUED] levothyroxine (SYNTHROID) 25 MCG tablet Take 1 tablet (25 mcg total) by mouth daily before breakfast.   No facility-administered encounter medications on file as of 12/04/2019.   Current Diagnosis/Assessment:   Emergency planning/management officer Strain: Low Risk   . Difficulty of Paying Living Expenses: Not very hard   Goals Addressed            This Visit's Progress   . Pharmacy Care Plan       CARE PLAN ENTRY  Current Barriers:  . Chronic Disease Management support, education, and care coordination needs related to Hypertension and Diabetes   Hypertension . Pharmacist Clinical Goal(s): o Over the next 6 weeks, patient will work with PharmD and providers to achieve BP goal <140/90 mmHg . Current regimen:   Amlodipine 5 mg - 1 tablet daily    Carvedilol 12.5 mg - 1 tablet twice daily with meals  Clonidine 0.3 mg - 1 tablet three times daily . Interventions: o Discussed patient has resumed amlodipine due to headaches when holding the medication; recommend checking home blood pressure due to recent medication changes . Patient self care activities - Over the next 6 weeks, patient will: o Check blood pressure at least once weekly, document, and provide at future appointment with pharmacist. Check blood pressure before breakfast and keep written log of all readings.  o Ensure daily salt intake < 2300 mg/day  Diabetes . Pharmacist Clinical Goal(s): o Over the next 6 weeks, patient will work with PharmD and providers to achieve A1c goal <7% . Current regimen:  o Insulin 70/30 (NPH/regular) - Inject 70 units before breakfast and 40-50 units before evening meal . Interventions: o Discussed concern about hypoglycemia, recommend watching evening dose of insulin and noting  how much insulin is given the evening before you have fasting hypoglycemia . Patient self care activities - Over the next 6 weeks, patient will: o Check blood sugar once daily, document, and provide at future appointment; Please write down glucose level, time, and insulin dosing that day if taking more than 40 units in the evening.  o Contact provider with any episodes of hypoglycemia  Initial goal documentation      Hypertension   CMP Latest Ref Rng & Units 07/01/2019 12/23/2018 09/24/2018  Glucose 70 - 99 mg/dL 81 93 218(H)  BUN 6 - 23 mg/dL 28(H) 25(H) 38(H)  Creatinine 0.40 - 1.50 mg/dL 2.15(H) 1.82(H) 2.03(H)  Sodium 135 - 145 mEq/L 142 141 141  Potassium 3.5 - 5.1 mEq/L 3.9 4.2 4.3  Chloride 96 - 112 mEq/L 109 108 106  CO2 19 - 32 mEq/L _0 Calcium 8.4 - 10.5 mg/dL 8.5 8.4 8.1(L)  Total Protein 6.0 - 8.3 g/dL - 5.7(L) -  Total Bilirubin 0.2 - 1.2 mg/dL - 0.5 -  Alkaline Phos 39 - 117 U/L - 86 -  AST 0 - 37 U/L - 13 -  ALT 0 - 53 U/L - 13 -    Kidney Function Lab Results  Component Value Date/Time   CREATININE 2.15 (H) 07/01/2019 11:19 AM   CREATININE 1.82 (H) 12/23/2018 09:17 AM   CREATININE 1.79 (H) 07/21/2013 10:07 AM   GFR 30.42 (L) 07/01/2019 11:19 AM   GFRNONAA 24 (L) 09/22/2018 05:05 AM   GFRNONAA 39 (L) 07/21/2013 10:07 AM   GFRAA 28 (L) 09/22/2018 05:05 AM   GFRAA 45 (L) 07/21/2013 10:07 AM   K 3.9 07/01/2019 11:19 AM   K 4.2 12/23/2018 09:17 AM   K 3.8 07/21/2013 10:07 AM   Office blood pressures are: BP Readings from Last 3 Encounters:  10/30/19 140/75  09/22/19 112/60  09/19/19 120/60   BP goal < 140/90 mmHg Patient has failed these meds in the past: none  Patient checks BP at home infrequently, checks if feeling dizzy Patient home BP readings are ranging: reports occasional low, denies any lows in the past month   BP goal < 140/90 mmHg Patient is currently controlled on the following medications:   Amlodipine 5 mg - 1 tablet daily  Carvedilol 12.5 mg - 1 tablet BID with meals  Clonidine 0.3 mg - 1 tablet TID  We discussed: cardio held amlodipine 09/22/19 due to hypotension/leg swelling, pt started back on amlodipine due to headaches without it; discussed checking BP once weekly to ensure control and no hypotension  Plan: Continue current medications; Check BP at least once weekly at home.   Hyperlipidemia/CAD   Lipid Panel     Component Value Date/Time   CHOL 125 12/23/2018 0917   TRIG 142.0 12/23/2018 0917   HDL 27.70 (L) 12/23/2018 0917   LDLCALC 69 12/23/2018 0917   The ASCVD Risk score Mikey Bussing DC Jr., et al., 2013) failed to calculate for the following reasons:   The patient has a prior MI or stroke diagnosis   CBC Latest Ref Rng & Units 09/21/2018 09/20/2018 08/14/2018  WBC 4.0 - 10.5 K/uL 10.5 13.8(H) 8.4  Hemoglobin 13.0 - 17.0 g/dL 14.1 15.0 14.4  Hematocrit 39 - 52 % 43.3 46.3 44.0  Platelets 150 - 400 K/uL 209 195 184   LDL goal < 70 Patient has failed these meds in past: none  reported Patient is currently controlled on the following medications:  . Atorvastatin 80 mg -  1 tablet daily . Ezetimibe 10 mg - 1 tablet daily . Aspirin 81 mg - 1 tablet daily . Clopidogrel 75 mg - 1 tablet daily  . Isosorbide Mononitrate ER 60 mg - 1 and 1/2 tablet BID  . Nitroglycerin 0.4 mg SL - 1 tablet PRN   We discussed:  Refills timely; confirmed adherence to all and has not needed nitro lately; denies abnormal bruising or bleeding; CBC stable   Plan: Continue current medications  Asthma / Tobacco   Pulmonary fibrosis and pulmonary HTN Last spirometry score: 02/2019 -- Post-bronch FEV1 83%, Moderate Restrictive Disease with Significant Broncho-Dilator Response  Eosinophil count:   Lab Results  Component Value Date/Time   EOSPCT 1 09/20/2018 07:13 AM  %                               Eos (Absolute):  Lab Results  Component Value Date/Time   EOSABS 0.1 09/20/2018 07:13 AM   Tobacco Status:  Social History   Tobacco Use  Smoking Status Never Smoker  Smokeless Tobacco Never Used   Patient has failed these meds in past: Breo - replaced with Trelegy Patient is currently controlled on the following medications:   Trelegy - 1 puff daily   Albuterol 90 mcg - 2 puffs q6h PRN  Supplemental Oxygen  Using maintenance inhaler regularly? Yes Frequency of rescue inhaler use:  3-5x times per week  We discussed: doing well on Trelegy, stopped Breo; has noticed slight improvement in shortness of breath since switching to Trelegy Symptoms: SOB anytime he goes outside to do yard work, not too bad walking around the house; uses albuterol when he comes back inside 3-4x times a week   Plan: Continue current medications    Diabetes   Recent Relevant Labs: Lab Results  Component Value Date/Time   HGBA1C 7.1 (H) 09/09/2019 10:51 AM   HGBA1C 7.4 (A) 07/01/2019 10:35 AM   HGBA1C 7.6 (H) 03/21/2019 09:32 AM   MICROALBUR 221.2 Repeated and verified X2. (H) 09/15/2013 10:09 AM      Checking BG: Daily (every morning, sometimes before supper) Recent FBG Readings: 60-120 Denies frequent readings < 70 - reports last low was a couple of weeks ago in the morning  A1c goal < 7% Patient has failed these meds in past: none  Patient is currently controlled on the following medications:  Insulin 70/30 (NPH/regular) - Inject 70 units AM meal, 40-50 units PM meal  --> nighttime insulin: checks BG and adjusts dose accordingly  120 or higher - 50 units  < 120 - 40 units   Last diabetic eye exam:  Lab Results  Component Value Date/Time   HMDIABEYEEXA Retinopathy (A) 02/27/2018 08:48 AM    Last diabetic foot exam:  Lab Results  Component Value Date/Time   HMDIABFOOTEX yes 09/15/2010 12:00 AM    We discussed: call if any episodes of hypoglycemia; could consider Farxiga for CKD but patient is concerned about cost   Plan: Continue current medications   Heart Failure   Type: Diastolic Last ejection fraction: March 2020 - 45-50% NYHA Class: II (slight limitation of activity) AHA HF Stage: C (Heart disease and symptoms present)  Patient has failed these meds in past: none  Patient is currently controlled on the following medications:   Carvedilol 12.5 mg - 1 tablet BID with meals  Potassium Chloride 10 mEq - 1-2 tablets alternating every other day   Torsemide 20 mg - 1-2  tablets alternating every other day   We discussed: reports swelling and SOB are stable   Plan: Continue current medications   Neuropathy   Patient has failed these meds in past: none Patient is currently controlled on the following medications:   Gabapentin 400 mg - 2 capsules AM, 2 capsule PM   Tylenol 500 mg - takes 2-3 qhs   Voltaren Gel 1% - applies to knee a couple days per month  We discussed: Denies constipation, taking MiraLAX PRN  Plan: Continue current medications  Hypothyroidism   Lab Results  Component Value Date/Time   TSH 7.38 (H) 12/11/2019 10:07 AM   TSH <0.01  Repeated and verified X2. (L) 09/09/2019 10:51 AM   TSH 0.03 Repeated and verified X2. (L) 07/01/2019 11:19 AM   Patient has failed these meds in past: none  Patient is currently controlled on the following medications:  . Levothyroxine 25 mcg - 1 tablet daily (reduced from 50 mcg since most recent lab)  Plan: Continue current medications   BPH   Patient has failed these meds in past: none  Patient is currently controlled on the following medications:   Tamsulosin 0.4 mg - 1 capsule daily  We discussed: reports doing well with tamsulosin   Plan: Continue current medications  Medication Management  Misc: Dorzolamide/timolol - 1 drop in each eye BID, Sildenafil 20 mg - 1 tablet TID for pulmonary HTN   OTCs: Benadryl - denies taking   Pharmacy/Benefits:[facility]/Mail order - maintenance meds/synced; [facility] - Insulin, Trelegy; [facility] - Sildenafil  Adherence: no concerns   Affordability: denies concerns   CCM Follow Up: 6 weeks telephone  - check BG and BP log    Debbora Dus, PharmD Clinical Pharmacist [facility] [phone]

## 2019-12-11 ENCOUNTER — Other Ambulatory Visit: Payer: Self-pay

## 2019-12-11 ENCOUNTER — Other Ambulatory Visit (INDEPENDENT_AMBULATORY_CARE_PROVIDER_SITE_OTHER): Payer: Medicare HMO

## 2019-12-11 DIAGNOSIS — E039 Hypothyroidism, unspecified: Secondary | ICD-10-CM

## 2019-12-11 LAB — TSH: TSH: 7.38 u[IU]/mL — ABNORMAL HIGH (ref 0.35–4.50)

## 2019-12-14 ENCOUNTER — Other Ambulatory Visit: Payer: Self-pay | Admitting: Family Medicine

## 2019-12-14 DIAGNOSIS — E039 Hypothyroidism, unspecified: Secondary | ICD-10-CM

## 2019-12-14 MED ORDER — LEVOTHYROXINE SODIUM 25 MCG PO TABS: 25.0000 ug | ORAL_TABLET | Freq: Every day | ORAL | Status: DC

## 2019-12-20 ENCOUNTER — Other Ambulatory Visit: Payer: Self-pay | Admitting: Family

## 2019-12-20 NOTE — Patient Instructions (Addendum)
Dear Stephen [name],  It was a pleasure meeting you during our initial appointment on December 04, 2019. Below is a summary of the goals we discussed and components of chronic care management. Please contact me anytime with questions or concerns.   Visit Information  Goals Addressed            This Visit's Progress   . Pharmacy Care Plan       CARE PLAN ENTRY  Current Barriers:  . Chronic Disease Management support, education, and care coordination needs related to Hypertension and Diabetes   Hypertension . Pharmacist Clinical Goal(s): o Over the next 6 weeks, patient will work with PharmD and providers to achieve BP goal <140/90 mmHg . Current regimen:   Amlodipine 5 mg - 1 tablet daily   Carvedilol 12.5 mg - 1 tablet twice daily with meals  Clonidine 0.3 mg - 1 tablet three times daily . Interventions: o Discussed patient has resumed amlodipine due to headaches when holding the medication; recommend checking home blood pressure due to recent medication changes . Patient self care activities - Over the next 6 weeks, patient will: o Check blood pressure at least once weekly, document, and provide at future appointment with pharmacist. Check blood pressure before breakfast and keep written log of all readings.  o Ensure daily salt intake < 2300 mg/day  Diabetes . Pharmacist Clinical Goal(s): o Over the next 6 weeks, patient will work with PharmD and providers to achieve A1c goal <7% . Current regimen:  o Insulin 70/30 (NPH/regular) - Inject 70 units before breakfast and 40-50 units before evening meal . Interventions: o Discussed concern about hypoglycemia, recommend watching evening dose of insulin and noting how much insulin is given the evening before you have fasting hypoglycemia . Patient self care activities - Over the next 6 weeks, patient will: o Check blood sugar once daily, document, and provide at future appointment; Please write down glucose level, time, and insulin  dosing that day if taking more than 40 units in the evening.  o Contact provider with any episodes of hypoglycemia  Initial goal documentation      Stephen [name] was given information about Chronic Care Management services today including:  1. CCM service includes personalized support from designated clinical staff supervised by his physician, including individualized plan of care and coordination with other care providers 2. 24/7 contact phone numbers for assistance for urgent and routine care needs. 3. Standard insurance, coinsurance, copays and deductibles apply for chronic care management only during months in which we provide at least 20 minutes of these services. Most insurances cover these services at 100%, however patients may be responsible for any copay, coinsurance and/or deductible if applicable. This service may help you avoid the need for more expensive face-to-face services. 4. Only one practitioner may furnish and bill the service in a calendar month. 5. The patient may stop CCM services at any time (effective at the end of the month) by phone call to the office staff.  Patient agreed to services and verbal consent obtained.   The patient verbalized understanding of instructions provided today and agreed to receive a mailed copy of patient instruction and/or educational materials. Telephone follow up appointment with pharmacy team member scheduled for:  01/20/20 at 12:30 PM (telephone visit) to review blood pressure and blood glucose logs   Phil Dopp, PharmD Clinical Pharmacist [facility] [phone]  Hypoglycemia Hypoglycemia is when the sugar (glucose) level in your blood is too  low. Signs of low blood sugar may include:  Feeling: ? Hungry. ? Worried or nervous (anxious). ? Sweaty and clammy. ? Confused. ? Dizzy. ? Sleepy. ? Sick to your stomach (nauseous).  Having: ? A fast heartbeat. ? A headache. ? A change in your  vision. ? Tingling or no feeling (numbness) around your mouth, lips, or tongue. ? Jerky movements that you cannot control (seizure).  Having trouble with: ? Moving (coordination). ? Sleeping. ? Passing out (fainting). ? Getting upset easily (irritability). Low blood sugar can happen to people who have diabetes and people who do not have diabetes. Low blood sugar can happen quickly, and it can be an emergency. Treating low blood sugar Low blood sugar is often treated by eating or drinking something sugary right away, such as:  Fruit juice, 4-6 oz (120-150 mL).  Regular soda (not diet soda), 4-6 oz (120-150 mL).  Low-fat milk, 4 oz (120 mL).  Several pieces of hard candy.  Sugar or honey, 1 Tbsp (15 mL). Treating low blood sugar if you have diabetes If you can think clearly and swallow safely, follow the 15:15 rule:  Take 15 grams of a fast-acting carb (carbohydrate). Talk with your doctor about how much you should take.  Always keep a source of fast-acting carb with you, such as: ? Sugar tablets (glucose pills). Take 3-4 pills. ? 6-8 pieces of hard candy. ? 4-6 oz (120-150 mL) of fruit juice. ? 4-6 oz (120-150 mL) of regular (not diet) soda. ? 1 Tbsp (15 mL) honey or sugar.  Check your blood sugar 15 minutes after you take the carb.  If your blood sugar is still at or below 70 mg/dL (3.9 mmol/L), take 15 grams of a carb again.  If your blood sugar does not go above 70 mg/dL (3.9 mmol/L) after 3 tries, get help right away.  After your blood sugar goes back to normal, eat a meal or a snack within 1 hour.  Treating very low blood sugar If your blood sugar is at or below 54 mg/dL (3 mmol/L), you have very low blood sugar (severe hypoglycemia). This may also cause:  Passing out.  Jerky movements you cannot control (seizure).  Losing consciousness (coma). This is an emergency. Do not wait to see if the symptoms will go away. Get medical help right away. Call your local  emergency services (911 in the [place] Do not drive yourself to the hospital. If you have very low blood sugar and you cannot eat or drink, you may need a glucagon shot (injection). A family member or friend should learn how to check your blood sugar and how to give you a glucagon shot. Ask your doctor if you need to have a glucagon shot kit at home. Follow these instructions at home: General instructions  Take over-the-counter and prescription medicines only as told by your doctor.  Stay aware of your blood sugar as told by your doctor.  Limit alcohol intake to no more than 1 drink a day for nonpregnant women and 2 drinks a day for men. One drink equals 12 oz of beer (355 mL), 5 oz of wine (148 mL), or 1 oz of hard liquor (44 mL).  Keep all follow-up visits as told by your doctor. This is important. If you have diabetes:   Follow your diabetes care plan as told by your doctor. Make sure you: ? Know the signs of low blood sugar. ? Take your medicines as told. ? Follow your exercise and meal  plan. ? Eat on time. Do not skip meals. ? Check your blood sugar as often as told by your doctor. Always check it before and after exercise. ? Follow your sick day plan when you cannot eat or drink normally. Make this plan ahead of time with your doctor.  Share your diabetes care plan with: ? Your work or school. ? People you live with.  Check your pee (urine) for ketones: ? When you are sick. ? As told by your doctor.  Carry a card or wear jewelry that says you have diabetes. Contact a doctor if:  You have trouble keeping your blood sugar in your target range.  You have low blood sugar often. Get help right away if:  You still have symptoms after you eat or drink something sugary.  Your blood sugar is at or below 54 mg/dL (3 mmol/L).  You have jerky movements that you cannot control.  You pass out. These symptoms may be an emergency. Do not wait to see if the symptoms will go away.  Get medical help right away. Call your local emergency services (911 in the [place] Do not drive yourself to the hospital. Summary  Hypoglycemia happens when the level of sugar (glucose) in your blood is too low.  Low blood sugar can happen to people who have diabetes and people who do not have diabetes. Low blood sugar can happen quickly, and it can be an emergency.  Make sure you know the signs of low blood sugar and know how to treat it.  Always keep a source of sugar (fast-acting carb) with you to treat low blood sugar. This information is not intended to replace advice given to you by your health care provider. Make sure you discuss any questions you have with your health care provider. Document Revised: 10/10/2018 Document Reviewed: 07/23/2015 Elsevier Patient Education  2020 [employer].

## 2019-12-26 DIAGNOSIS — J449 Chronic obstructive pulmonary disease, unspecified: Secondary | ICD-10-CM | POA: Diagnosis not present

## 2020-01-15 ENCOUNTER — Telehealth: Payer: Self-pay | Admitting: Family Medicine

## 2020-01-15 NOTE — Telephone Encounter (Signed)
Patient came into office and dropped off form for handicap placard. Placed in tower to be filled. Please advise.

## 2020-01-16 NOTE — Telephone Encounter (Signed)
Completely appropriate to do.  I will work on [employer].  Thanks.

## 2020-01-20 ENCOUNTER — Telehealth: Payer: Medicare HMO

## 2020-01-22 ENCOUNTER — Other Ambulatory Visit: Payer: Self-pay

## 2020-01-22 ENCOUNTER — Ambulatory Visit: Payer: Medicare HMO | Admitting: Pulmonary Disease

## 2020-01-22 ENCOUNTER — Encounter: Payer: Self-pay | Admitting: Pulmonary Disease

## 2020-01-22 VITALS — BP 148/76 | HR 58 | Temp 97.8°F | Ht 65.0 in | Wt 272.4 lb

## 2020-01-22 DIAGNOSIS — J849 Interstitial pulmonary disease, unspecified: Secondary | ICD-10-CM | POA: Diagnosis not present

## 2020-01-22 DIAGNOSIS — I272 Pulmonary hypertension, unspecified: Secondary | ICD-10-CM | POA: Diagnosis not present

## 2020-01-22 DIAGNOSIS — J9611 Chronic respiratory failure with hypoxia: Secondary | ICD-10-CM

## 2020-01-22 DIAGNOSIS — Z9989 Dependence on other enabling machines and devices: Secondary | ICD-10-CM

## 2020-01-22 DIAGNOSIS — J4540 Moderate persistent asthma, uncomplicated: Secondary | ICD-10-CM

## 2020-01-22 DIAGNOSIS — E662 Morbid (severe) obesity with alveolar hypoventilation: Secondary | ICD-10-CM | POA: Diagnosis not present

## 2020-01-22 DIAGNOSIS — G4733 Obstructive sleep apnea (adult) (pediatric): Secondary | ICD-10-CM

## 2020-01-22 NOTE — Patient Instructions (Signed)
Continue Trelegy.  Continue your oxygen therapy.  See you in follow-up in 3 months time call sooner should any new difficulties arise.

## 2020-01-22 NOTE — Progress Notes (Signed)
Subjective:    Patient ID: Stephen [name], male    DOB: 1945/10/09, 74 y.o.   MRN: [id number]  HPI This is a 74 year old lifelong never smoker, with a very complex history as noted on his initial visit of 28 March 2019.  At that time he had been given a trial of Breo Ellipta for management of moderate persistent asthma.  He subsequently followed on 30 October 2019 and noted that the [facility] was helping but was not as effective.  He was then switched to Trelegy Ellipta.  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  He has chronic respiratory failure with hypoxia requiring oxygen chronically, currently at 3 liters per minute pulsed.  He presents today noting marked improvement on his respiratory status with Trelegy Ellipta.  States that overall he is doing "good".  Dyspnea is class III (MRC).  He has not had any fevers, chills or sweats no cough or sputum production.  Doing relatively well.  Synopsis of his complex medical history as below: 1.  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 2.  Negative connective tissue disease work-up May 2010 at [facility] by Dr. Thom Chimes. 3.  Chronic hypoxemic respiratory failure due to the above.  On 5 L/min nasal cannula O2. 4.  Labeled as COPD however this is an erroneous diagnosis see below. 5.  Obstructive sleep apnea on CPAP the patient believes at 16 cm H2O. 6.  Coronary artery disease status post PTCI 7.  Chronic diastolic heart failure,left ventricular ejection fraction 45 to 50% 3/20 8.  Pulmonary hypertension with elevated left ventricular end-diastolic pressure 9.  CKD III 10.  Diabetes mellitus type 2 11.  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  BMI 45  Review of Systems A 10 point review of systems was performed and it is as noted above otherwise negative.  Allergies  Allergen Reactions  . Calcium Channel  Blockers     Would avoid if possible due to h/o peripheral edema   Current Meds  Medication Sig  . ACCU-CHEK SOFTCLIX LANCETS lancets Use as instructed to check blood sugar three times daily and as needed.  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  Marland Kitchen acetaminophen (TYLENOL) 325 MG tablet Take 2 tablets (650 mg total) by mouth every 6 (six) hours as needed for mild pain (or Fever >/= 101).  Marland Kitchen albuterol (VENTOLIN HFA) 108 (90 Base) MCG/ACT inhaler Inhale 2 puffs into the lungs every 6 (six) hours as needed for wheezing or shortness of breath. Dispense 3 inhalers.  . Alcohol Swabs (B-D SINGLE USE SWABS REGULAR) PADS Use to cleans area prior to checking blood sugar three times daily and as needed.  Diagnosis:  E11.22  Insulin dependent.  Marland Kitchen amLODipine (NORVASC) 5 MG tablet Take 1 tablet (5 mg total) by mouth daily.  Marland Kitchen aspirin 81 MG tablet Take 81 mg by mouth daily.    Marland Kitchen atorvastatin (LIPITOR) 80 MG tablet TAKE 1 TABLET AT BEDTIME  . benzonatate (TESSALON) 100 MG capsule Take 1-2 capsules (100-200 mg total) by mouth 3 (three) times daily as needed.  . Blood Glucose Monitoring Suppl (ACCU-CHEK AVIVA PLUS) w/Device KIT Use to check blood sugar three times daily and as needed.  Diagnosis: E11.22  Insulin-dependent  . carvedilol (COREG) 12.5 MG tablet Take 1 tablet (12.5 mg total) by mouth 2 (two) times daily with a meal.  . cloNIDine (CATAPRES) 0.3 MG tablet TAKE   Subjective:    Patient ID: Stephen [name], male    DOB: 04/24/1946, 74 y.o.   MRN: [id number]  HPI This is a 74-year-old lifelong never smoker never smoker, with a very complex history as noted on his initial visit of 28 March 2019.  At that time he had been given a trial of Breo Ellipta for management of moderate persistent asthma.  He subsequently followed on 30 October 2019 and noted that the Breo was helping but was not as effective.  He was then switched to Trelegy Ellipta.  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  He has chronic respiratory failure with hypoxia requiring oxygen chronically, currently at 3 liters per minute pulsed.  He presents today noting marked improvement on his respiratory status with Trelegy Ellipta.  States that overall he is doing "good".  Dyspnea is class III (MRC).  He has not had any fevers, chills or sweats no cough or sputum production.  Doing relatively well.  Synopsis of his complex medical history as below: 1.  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 2.  Negative connective tissue disease work-up May 2010 at [facility] by Dr. Victor Tapson. 3.  Chronic hypoxemic respiratory failure due to the above.  On 5 L/min nasal cannula O2. 4.  Labeled as COPD however this is an erroneous diagnosis see below. 5.  Obstructive sleep apnea on CPAP the patient believes at 16 cm H2O. 6.  Coronary artery disease status post PTCI 7.  Chronic diastolic heart failure,left ventricular ejection fraction 45 to 50% 3/20 8.  Pulmonary hypertension with elevated left ventricular end-diastolic pressure 9.  CKD III 10.  Diabetes mellitus type 2 11.  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  BMI 45  Review of Systems A 10 point review of systems was performed and it is as noted above otherwise negative.  Allergies  Allergen Reactions  . Calcium Channel  Blockers     Would avoid if possible due to h/o peripheral edema   Current Meds  Medication Sig  . ACCU-CHEK SOFTCLIX LANCETS lancets Use as instructed to check blood sugar three times daily and as needed.  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  . acetaminophen (TYLENOL) 325 MG tablet Take 2 tablets (650 mg total) by mouth every 6 (six) hours as needed for mild pain (or Fever >/= 101).  . albuterol (VENTOLIN HFA) 108 (90 Base) MCG/ACT inhaler Inhale 2 puffs into the lungs every 6 (six) hours as needed for wheezing or shortness of breath. Dispense 3 inhalers.  . Alcohol Swabs (B-D SINGLE USE SWABS REGULAR) PADS Use to cleans area prior to checking blood sugar three times daily and as needed.  Diagnosis:  E11.22  Insulin dependent.  . amLODipine (NORVASC) 5 MG tablet Take 1 tablet (5 mg total) by mouth daily.  . aspirin 81 MG tablet Take 81 mg by mouth daily.    . atorvastatin (LIPITOR) 80 MG tablet TAKE 1 TABLET AT BEDTIME  . benzonatate (TESSALON) 100 MG capsule Take 1-2 capsules (100-200 mg total) by mouth 3 (three) times daily as needed.  . Blood Glucose Monitoring Suppl (ACCU-CHEK AVIVA PLUS) w/Device KIT Use to check blood sugar three times daily and as needed.  Diagnosis: E11.22  Insulin-dependent  . carvedilol (COREG) 12.5 MG tablet Take 1 tablet (12.5 mg total) by mouth 2 (two) times daily with a meal.  . cloNIDine (CATAPRES) 0.3 MG tablet TAKE   Subjective:    Patient ID: Stephen [name], male    DOB: 1945/10/09, 74 y.o.   MRN: [id number]  HPI This is a 74 year old lifelong never smoker, with a very complex history as noted on his initial visit of 28 March 2019.  At that time he had been given a trial of Breo Ellipta for management of moderate persistent asthma.  He subsequently followed on 30 October 2019 and noted that the [facility] was helping but was not as effective.  He was then switched to Trelegy Ellipta.  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  He has chronic respiratory failure with hypoxia requiring oxygen chronically, currently at 3 liters per minute pulsed.  He presents today noting marked improvement on his respiratory status with Trelegy Ellipta.  States that overall he is doing "good".  Dyspnea is class III (MRC).  He has not had any fevers, chills or sweats no cough or sputum production.  Doing relatively well.  Synopsis of his complex medical history as below: 1.  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 2.  Negative connective tissue disease work-up May 2010 at [facility] by Dr. Thom Chimes. 3.  Chronic hypoxemic respiratory failure due to the above.  On 5 L/min nasal cannula O2. 4.  Labeled as COPD however this is an erroneous diagnosis see below. 5.  Obstructive sleep apnea on CPAP the patient believes at 16 cm H2O. 6.  Coronary artery disease status post PTCI 7.  Chronic diastolic heart failure,left ventricular ejection fraction 45 to 50% 3/20 8.  Pulmonary hypertension with elevated left ventricular end-diastolic pressure 9.  CKD III 10.  Diabetes mellitus type 2 11.  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  BMI 45  Review of Systems A 10 point review of systems was performed and it is as noted above otherwise negative.  Allergies  Allergen Reactions  . Calcium Channel  Blockers     Would avoid if possible due to h/o peripheral edema   Current Meds  Medication Sig  . ACCU-CHEK SOFTCLIX LANCETS lancets Use as instructed to check blood sugar three times daily and as needed.  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  Marland Kitchen acetaminophen (TYLENOL) 325 MG tablet Take 2 tablets (650 mg total) by mouth every 6 (six) hours as needed for mild pain (or Fever >/= 101).  Marland Kitchen albuterol (VENTOLIN HFA) 108 (90 Base) MCG/ACT inhaler Inhale 2 puffs into the lungs every 6 (six) hours as needed for wheezing or shortness of breath. Dispense 3 inhalers.  . Alcohol Swabs (B-D SINGLE USE SWABS REGULAR) PADS Use to cleans area prior to checking blood sugar three times daily and as needed.  Diagnosis:  E11.22  Insulin dependent.  Marland Kitchen amLODipine (NORVASC) 5 MG tablet Take 1 tablet (5 mg total) by mouth daily.  Marland Kitchen aspirin 81 MG tablet Take 81 mg by mouth daily.    Marland Kitchen atorvastatin (LIPITOR) 80 MG tablet TAKE 1 TABLET AT BEDTIME  . benzonatate (TESSALON) 100 MG capsule Take 1-2 capsules (100-200 mg total) by mouth 3 (three) times daily as needed.  . Blood Glucose Monitoring Suppl (ACCU-CHEK AVIVA PLUS) w/Device KIT Use to check blood sugar three times daily and as needed.  Diagnosis: E11.22  Insulin-dependent  . carvedilol (COREG) 12.5 MG tablet Take 1 tablet (12.5 mg total) by mouth 2 (two) times daily with a meal.  . cloNIDine (CATAPRES) 0.3 MG tablet TAKE

## 2020-01-25 DIAGNOSIS — J449 Chronic obstructive pulmonary disease, unspecified: Secondary | ICD-10-CM | POA: Diagnosis not present

## 2020-01-26 DIAGNOSIS — N2581 Secondary hyperparathyroidism of renal origin: Secondary | ICD-10-CM | POA: Diagnosis not present

## 2020-01-26 DIAGNOSIS — I701 Atherosclerosis of renal artery: Secondary | ICD-10-CM | POA: Diagnosis not present

## 2020-01-26 DIAGNOSIS — R600 Localized edema: Secondary | ICD-10-CM | POA: Diagnosis not present

## 2020-01-26 DIAGNOSIS — E1122 Type 2 diabetes mellitus with diabetic chronic kidney disease: Secondary | ICD-10-CM | POA: Diagnosis not present

## 2020-01-26 DIAGNOSIS — N184 Chronic kidney disease, stage 4 (severe): Secondary | ICD-10-CM | POA: Diagnosis not present

## 2020-01-26 DIAGNOSIS — I10 Essential (primary) hypertension: Secondary | ICD-10-CM | POA: Diagnosis not present

## 2020-02-23 ENCOUNTER — Other Ambulatory Visit: Payer: Self-pay

## 2020-02-23 MED ORDER — TORSEMIDE 20 MG PO TABS: 20.0000 mg | ORAL_TABLET | ORAL | 3 refills | Status: DC

## 2020-02-24 ENCOUNTER — Other Ambulatory Visit: Payer: Self-pay | Admitting: *Deleted

## 2020-02-24 DIAGNOSIS — Z794 Long term (current) use of insulin: Secondary | ICD-10-CM

## 2020-02-24 DIAGNOSIS — E1121 Type 2 diabetes mellitus with diabetic nephropathy: Secondary | ICD-10-CM

## 2020-02-24 NOTE — Telephone Encounter (Signed)
Faxed refill request. Gabapentin Last office visit:   09/19/2019 Acute - [facility]sole Last Filled:     450 capsule 3 12/24/2018

## 2020-02-25 DIAGNOSIS — E113213 Type 2 diabetes mellitus with mild nonproliferative diabetic retinopathy with macular edema, bilateral: Secondary | ICD-10-CM | POA: Diagnosis not present

## 2020-02-25 DIAGNOSIS — J449 Chronic obstructive pulmonary disease, unspecified: Secondary | ICD-10-CM | POA: Diagnosis not present

## 2020-02-25 LAB — HM DIABETES EYE EXAM

## 2020-02-25 MED ORDER — GABAPENTIN 400 MG PO CAPS: ORAL_CAPSULE | ORAL | 3 refills | Status: DC

## 2020-02-25 NOTE — Telephone Encounter (Signed)
Sent. Thanks.  I added A1c to upcoming lab visit, he was coming in to get a follow-up TSH. Can he get scheduled for a phone/video diabetes follow-up after that?  Thanks.

## 2020-02-26 NOTE — Telephone Encounter (Signed)
Appointment scheduled.

## 2020-03-03 DIAGNOSIS — E113211 Type 2 diabetes mellitus with mild nonproliferative diabetic retinopathy with macular edema, right eye: Secondary | ICD-10-CM | POA: Diagnosis not present

## 2020-03-03 DIAGNOSIS — E113213 Type 2 diabetes mellitus with mild nonproliferative diabetic retinopathy with macular edema, bilateral: Secondary | ICD-10-CM | POA: Diagnosis not present

## 2020-03-03 DIAGNOSIS — E113212 Type 2 diabetes mellitus with mild nonproliferative diabetic retinopathy with macular edema, left eye: Secondary | ICD-10-CM | POA: Diagnosis not present

## 2020-03-16 ENCOUNTER — Other Ambulatory Visit: Payer: Self-pay

## 2020-03-16 ENCOUNTER — Other Ambulatory Visit (INDEPENDENT_AMBULATORY_CARE_PROVIDER_SITE_OTHER): Payer: Medicare HMO

## 2020-03-16 DIAGNOSIS — E1121 Type 2 diabetes mellitus with diabetic nephropathy: Secondary | ICD-10-CM | POA: Diagnosis not present

## 2020-03-16 DIAGNOSIS — E039 Hypothyroidism, unspecified: Secondary | ICD-10-CM | POA: Diagnosis not present

## 2020-03-16 DIAGNOSIS — Z794 Long term (current) use of insulin: Secondary | ICD-10-CM

## 2020-03-16 LAB — TSH: TSH: 4.93 u[IU]/mL — ABNORMAL HIGH (ref 0.35–4.50)

## 2020-03-16 LAB — HEMOGLOBIN A1C: Hgb A1c MFr Bld: 7.4 % — ABNORMAL HIGH (ref 4.6–6.5)

## 2020-03-18 ENCOUNTER — Other Ambulatory Visit: Payer: Self-pay | Admitting: Family Medicine

## 2020-03-18 ENCOUNTER — Other Ambulatory Visit: Payer: Self-pay | Admitting: Cardiovascular Disease

## 2020-03-19 ENCOUNTER — Ambulatory Visit (INDEPENDENT_AMBULATORY_CARE_PROVIDER_SITE_OTHER): Payer: Medicare HMO | Admitting: Family Medicine

## 2020-03-19 ENCOUNTER — Other Ambulatory Visit: Payer: Self-pay

## 2020-03-19 ENCOUNTER — Encounter: Payer: Self-pay | Admitting: Family Medicine

## 2020-03-19 VITALS — BP 142/72 | HR 62 | Temp 97.0°F | Ht 65.0 in | Wt 266.2 lb

## 2020-03-19 DIAGNOSIS — R0902 Hypoxemia: Secondary | ICD-10-CM

## 2020-03-19 DIAGNOSIS — E039 Hypothyroidism, unspecified: Secondary | ICD-10-CM | POA: Diagnosis not present

## 2020-03-19 DIAGNOSIS — Z794 Long term (current) use of insulin: Secondary | ICD-10-CM | POA: Diagnosis not present

## 2020-03-19 DIAGNOSIS — E1121 Type 2 diabetes mellitus with diabetic nephropathy: Secondary | ICD-10-CM | POA: Diagnosis not present

## 2020-03-19 DIAGNOSIS — Z23 Encounter for immunization: Secondary | ICD-10-CM | POA: Diagnosis not present

## 2020-03-19 MED ORDER — LEVOTHYROXINE SODIUM 25 MCG PO TABS: ORAL_TABLET | ORAL | Status: DC

## 2020-03-19 NOTE — Progress Notes (Signed)
This visit occurred during the SARS-CoV-2 public health emergency.  Safety protocols were in place, including screening questions prior to the visit, additional usage of staff PPE, and extensive cleaning of exam room while observing appropriate contact time as indicated for disinfecting solutions.  Diabetes:  Using medications without difficulties: yes Hypoglycemic episodes:no Hyperglycemic episodes:no Feet problems: no Blood Sugars averaging: ~120 in the AM eye exam within last year: done about 2 weeks ago.  Stephen [name] eye.  A1c 7.4.    He has some BLE edema but improved from prev levels.    Still on O2 at baseline.  He has SOB with exertion, improved with O2 use.  This is at baseline.  TSH improved, still elevated.  Labs d/w pt.  Some fatigue.  No dysphagia.    moderna Covid vaccine prev done.    Meds, vitals, and allergies reviewed.  ROS: Per HPI unless specifically indicated in ROS section   GEN: nad, alert and oriented, on O2 at baseline.  HEENT: ncat NECK: supple w/o LA, no tmg CV: rrr. PULM: ctab, no inc wob ABD: soft, +bs EXT: 1+ BLE edema SKIN: no acute rash  Diabetic foot exam: Normal inspection No skin breakdown No calluses  Normal DP pulses Normal sensation to light touch and monofilament Nails normal

## 2020-03-19 NOTE — Patient Instructions (Addendum)
Please check with pulmonary follow up with Dr. Patsey Berthold.   [place] [address] [phone]  1 tab of thyroid medicine a day except for 2 tabs on Sundays and Wednesdays.  9 pills in a week.    Recheck in about 4 months, labs ahead of time if possible.    Take care.  Glad to see you.

## 2020-03-21 DIAGNOSIS — R0902 Hypoxemia: Secondary | ICD-10-CM | POA: Insufficient documentation

## 2020-03-21 NOTE — Assessment & Plan Note (Signed)
A1c reasonable at 7.4.  No change in meds at this point.  Continue insulin as is.  Continue work on diet.

## 2020-03-21 NOTE — Assessment & Plan Note (Signed)
TSH still elevated.  Increase levothyroxine to 1 pill a day except for 2 tablets on Sundays and Wednesdays, total of 9 pills a week.  We can recheck labs periodically.  He agrees.

## 2020-03-21 NOTE — Assessment & Plan Note (Signed)
Using O2 at baseline.  I asked him to follow-up with pulmonary.  He will check on that.

## 2020-03-22 ENCOUNTER — Encounter: Payer: Self-pay | Admitting: Family Medicine

## 2020-03-22 NOTE — Progress Notes (Signed)
Date:  03/23/2020   ID:  Stephen [name], DOB [birth year], MRN [id number]  Patient Location:  [place] [address]   Provider location:   [facility], [place] office  PCP:  [name], MD  Cardiologist:  Arvid Right [facility]  Chief Complaint  Patient presents with   other    6 month follow up. Meds reviewed by the pt. verbally. Pt. c/o shortness of breath and chest pain.      History of Present Illness:    Stephen [name] is a 74 y.o. 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 Status post stent to his left renal artery for 80+ percent stenosis several weeks ago, performed by Dr. Lucky Cowboy. laser treatment of the lower extremity veins for leg swelling/venous insufficiency who presents for routine followup of his coronary artery disease and shortness of breath  Stable breathing, on 2 liters Works on garden Weight down 10 pounds  Torsemide 40/20 mg alternating Unable to get TEDS on Wife fell, broke hand Worsening leg swelling, venous etiology Rare chest pain  HBA1c 7.4 CR 2.15 in 06/2019 CR 2.5 in 01/26/2020  Prior cellulitis left leg, on antibiotics, slowly improving Chronic swelling  Chronic SOB 3-4 liters, 4 liters at home at night Followed by pulmonary, on Breo  EKG personally reviewed by myself on todays visit NSR rate 57 bpm, old anterior MI   Other past medical echocardiogram March 2020 ejection fraction 45 to 50% moderately elevated right heart pressures estimated at 50 mmHg   hospitalization March 2020 with acute respiratory distress O2 sat down to 80% on room air with oxygen walking around his  house,  Syncope, fell through a wall  upper respiratory infection  short course of prednisone, made his sugars run high   Myoview in July 2019 showed region of old infarct, possible region of ischemia though significant attenuation artifact No catheterization at the time as he was relatively asymptomatic, had renal dysfunction   Prior catheterization in July 2018 did show elevated left ventricular end-diastolic pressure Prior echocardiogram also with elevated pressure   Prior CV studies:   The following studies were reviewed today:  Stress test ordered for shortness of breath Mages pulled up and discussed with him  defect lateral wall Known occluded left circumflex vessel Ejection fraction 30-45%  Cath 01/11/2017, occluded LCX, with collaterals Distal RCA stenosis in-stent 70%, medical management at this time  Echocardiogram ejection fraction 50-55%  Stress test July 2011 was dobutamine stress that showed no significant ischemia, inadequate heart rate was achieved. right and left heart catheterization January 2011 showed 50% mid LAD disease, 60% at the ostium of the diagonal #2, 30% proximal left circumflex disease, patent stent in the left circumflex, patent stent of the mid RCA, wedge pressure of 16, PA pressure mean 22, right ventricular pressure 41/8 .  echocardiogram September 2010 shows normal systolic function, diastolic dysfunction, normal RV function unable to evaluate right ventricular systolic pressures   Past Medical History:  Diagnosis Date   Adrenal gland anomaly    Arthritis    CAD (coronary artery disease)    a. 04/2006 MI and PCI/stenting to mLCx & mRCA; b. 07/2009  Cath: patent LCX/RCA stents;  c. 12/2016 NSTEMI/Cath: LM 30, LAD 30p, 44m 30d, D1/2/3 min irregs, LCX 1041mSR, OM1 min irregs, RCA 20p, 10 ISR, 7078m0d, RPDA min irregs, RPAV 40-->Med Rx; d. 12/2017 MV: fixed lateral wall scar, mild anterior/anterior septal ischemia. EF 30-44%.   CHF  (congestive heart failure) (HCC)    CKD (chronic kidney disease), stage III    Colon polyps    DM2 (diabetes mellitus, type 2) (HCC)    insulin requiring   Dyslipidemia    GERD (gastroesophageal reflux disease)    History of echocardiogram    a. TTE 12/2016: EF 50-55%, mod concentric LVH, images inadequate for wall motion assessment, not technically sufficienct to allow for LV dias fxn, calcified mitral annulus    HTN (hypertension)    Kidney stones    Left Renal artery stenosis ([facility]  a. 07/2013 s/p PTA/stenting ([facility]); b. 07/2017 Renal Duplex: No significant RAS.   Myocardial infarction (HCC)    OSA (obstructive sleep apnea)    Pulmonary fibrosis ([facility]  a. 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   Recurrent UTI    Rotator cuff disorder    right   Skin cancer    head   Thyroid disorder    Venous insufficiency of both lower extremities    a. s/p laser treatment.   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   CARPAL TUNNEL RELEASE     bilateral   CATARACT EXTRACTION W/PHACO Right 07/08/2018   Procedure: CATARACT EXTRACTION PHACO AND INTRAOCULAR LENS PLACEMENT ([facility];  Surgeon: KinEulogio BearD;  [facility];  Laterality: Right;  Diabetic - insulin sleep apnea   CATARACT EXTRACTION W/PHACO Left 08/05/2018   Procedure: CATARACT EXTRACTION PHACO AND INTRAOCULAR LENS PLACEMENT ([facility] [facility];  Surgeon: KinEulogio BearD;  [facility];  Laterality: Left;  DIABETIC   CORONARY ANGIOPLASTY WITH STENT PLACEMENT  2007   x 2   KNEE SURGERY     right   LEFT HEART CATH AND CORONARY ANGIOGRAPHY N/A 01/11/2017   Procedure: Left Heart Cath and Coronary Angiography;  Surgeon: AriWellington HampshireD;  [facility];  Service: Cardiovascular;   Laterality: N/A;   LUNG BIOPSY     RENAL ARTERY STENT  2015   L   SKIN CANCER EXCISION     back of head     Current Meds  Medication Sig   ACCU-CHEK SOFTCLIX LANCETS lancets Use as instructed to check blood sugar three times daily and as needed.  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   acetaminophen (TYLENOL) 325 MG tablet Take 2 tablets (650 mg total) by mouth every 6 (six) hours as needed for mild pain (or Fever >/= 101).   albuterol (VENTOLIN HFA) 108 (90 Base) MCG/ACT inhaler Inhale 2 puffs into the lungs every 6 (six) hours as needed for wheezing or shortness of breath. Dispense 3 inhalers.   Alcohol Swabs (B-D SINGLE USE SWABS REGULAR) PADS Use to cleans area prior to checking blood sugar three times daily and as needed.  Diagnosis:  E11.22  Insulin dependent.   amLODipine (NORVASC) 5 MG tablet Take 1 tablet (5 mg total) by mouth daily.   aspirin 81 MG tablet Take 81 mg by mouth daily.     atorvastatin (LIPITOR) 80 MG tablet TAKE 1 TABLET  AT BEDTIME   benzonatate (TESSALON) 100 MG capsule Take 1-2 capsules (100-200 mg total) by mouth 3 (three) times daily as needed.   Blood Glucose Monitoring Suppl (ACCU-CHEK AVIVA PLUS) w/Device KIT Use to check blood sugar three times daily and as needed.  Diagnosis: E11.22  Insulin-dependent   carvedilol (COREG) 12.5 MG tablet TAKE 1 TABLET (12.5 MG TOTAL) BY MOUTH 2 (TWO) TIMES DAILY WITH A MEAL.   cloNIDine (CATAPRES) 0.3 MG tablet TAKE 1 TABLET THREE TIMES DAILY   clopidogrel (PLAVIX) 75 MG tablet TAKE 1 TABLET EVERY DAY   diclofenac sodium (VOLTAREN) 1 % GEL Apply 2 g topically 2 (two) times daily as needed (for left knee pain.).   diphenhydrAMINE (BENADRYL) 50 MG tablet Take 50 mg by mouth at bedtime.    docusate sodium (COLACE) 100 MG capsule Take 100 mg by mouth daily as needed for mild constipation.    dorzolamide-timolol (COSOPT) 22.3-6.8 MG/ML ophthalmic solution    ezetimibe (ZETIA) 10 MG tablet TAKE 1 TABLET EVERY  DAY   Fluticasone-Umeclidin-Vilant (TRELEGY ELLIPTA) 200-62.5-25 MCG/INH AEPB Inhale 1 puff into the lungs daily.   gabapentin (NEURONTIN) 400 MG capsule Take 2 tabs in the AM and 3 at night.   glucose blood (ACCU-CHEK AVIVA PLUS) test strip Use as instructed to check blood sugar 3 times daily or as needed.  Diagnosis:  E11.22  Insulin-dependent.   insulin NPH-regular Human (NOVOLIN 70/30) (70-30) 100 UNIT/ML injection Inject 50-70 Units into the skin See admin instructions. Inject 70u under the skin every morning and 40-45u under the skin at bedtime   Insulin Syringe-Needle U-100 (INSULIN SYRINGE 1CC/30GX5/16") 30G X 5/16" 1 ML MISC Use as directed to take insulin Dx E11.9   isosorbide mononitrate (IMDUR) 60 MG 24 hr tablet TAKE 1 AND 1/2 TABLETS TWICE DAILY   levothyroxine (SYNTHROID) 25 MCG tablet 1 tab a day except take 2 tablets on Sundays and Wednesdays  Total of 9 pills a week.   nitroGLYCERIN (NITROSTAT) 0.4 MG SL tablet Place 1 tablet (0.4 mg total) under the tongue every 5 (five) minutes as needed. May repeat x3   omeprazole (PRILOSEC) 20 MG capsule Take 20 mg by mouth daily.     oxyCODONE (ROXICODONE) 15 MG immediate release tablet Take 1 tablet (15 mg total) by mouth daily as needed for pain. Hold for patient to request rx   polyethylene glycol powder (GLYCOLAX/MIRALAX) 17 GM/SCOOP powder Take 17 g by mouth daily as needed (for constipation).   potassium chloride (K-DUR) 10 MEQ tablet Take 1-2 tablets (10-20 mEq total) by mouth as directed. Alternating every other day. Take 1 tablet (10 mEq) daily and 2 tablets (20 mEq) daily   sildenafil (REVATIO) 20 MG tablet TAKE ONE TABLET BY MOUTH THREE TIMES A DAY   tamsulosin (FLOMAX) 0.4 MG CAPS capsule TAKE 1 CAPSULE (0.4 MG TOTAL) BY MOUTH AT BEDTIME.   torsemide (DEMADEX) 20 MG tablet Take 1-2 tablets (20-40 mg total) by mouth as directed. Alternate every other day. 1 tablet (20 mg) daily and 2 tablets (40 mg) daily      Allergies:   Calcium channel blockers   Social History   Tobacco Use   Smoking status: Never Smoker   Smokeless tobacco: Never Used  Vaping Use   Vaping Use: Never used  Substance Use Topics   Alcohol use: No    Alcohol/week: 0.0 standard drinks   Drug use: No       Family Hx: The patient's family history includes Arthritis in his  maternal grandmother; Breast cancer in his maternal aunt; COPD in his sister; Diabetes in his maternal aunt; Heart attack in his brother, father, and mother; Hypertension in his maternal aunt and another family member. There is no history of Prostate cancer, Kidney cancer, Bladder Cancer, or Colon cancer.  ROS:   Please see the history of present illness.    Review of Systems  Constitutional: Negative.   HENT: Negative.   Respiratory: Positive for shortness of breath.   Cardiovascular: Positive for leg swelling.  Gastrointestinal: Negative.   Musculoskeletal: Negative.   Neurological: Negative.   Psychiatric/Behavioral: Negative.   All other systems reviewed and are negative.    Labs/Other Tests and Data Reviewed:    Recent Labs: 07/01/2019: BUN 28; Creatinine, Ser 2.15; Potassium 3.9; Sodium 142 03/16/2020: TSH 4.93   Recent Lipid Panel Lab Results  Component Value Date/Time   CHOL 125 12/23/2018 09:17 AM   TRIG 142.0 12/23/2018 09:17 AM   HDL 27.70 (L) 12/23/2018 09:17 AM   CHOLHDL 5 12/23/2018 09:17 AM   LDLCALC 69 12/23/2018 09:17 AM    Wt Readings from Last 3 Encounters:  03/23/20 263 lb 8 oz (119.5 kg)  03/19/20 266 lb 4 oz (120.8 kg)  01/22/20 272 lb 6.4 oz (123.6 kg)     Exam:    Vital Signs: Vital signs may also be detailed in the HPI BP (!) 120/58 (BP Location: Left Arm, Patient Position: Sitting, Cuff Size: Large)    Pulse (!) 57    Ht _0  (1.651 m)    Wt 263 lb 8 oz (119.5 kg)    SpO2 98% Comment: on 2 Liters oxygen   BMI 43.85 kg/m   Constitutional:  oriented to person, place, and time. No distress.  HENT:   Head: Grossly normal Eyes:  no discharge. No scleral icterus.  Neck: No JVD, no carotid bruits  Cardiovascular: Regular rate and rhythm, no murmurs appreciated 2+ lower extremity edema, trace pitting Pulmonary/Chest: Clear to auscultation bilaterally, no wheezes or rails Abdominal: Soft.  no distension.  no tenderness.  Musculoskeletal: Normal range of motion Neurological:  normal muscle tone. Coordination normal. No atrophy Skin: Skin warm and dry Psychiatric: normal affect, pleasant   ASSESSMENT & PLAN:    Pulmonary hypertension, primary ([place]) Secondary to interstitial lung disease Continued shortness of breath, on 3 to 4 L, Breo Followed by pulmonary Prior echocardiogram  with moderately elevated right heart pressures Renal function trending higher creatinine 2.5, no changes to his diuretic alternating 40 mg with 20 mg torsemide  Morbid obesity (HCC) Weight trending downward still high No regular exercise program, very limited Recommended low carbohydrate diet  Chronic diastolic congestive heart failure (HCC) Creatinine 2.5, highest end of his range Followed by nephrology Delicate balance between overdiuresis and his renal dysfunction  Coronary artery disease involving native coronary artery of native heart with angina pectoris (HCC) History of chest pain, no further work-up at this time  PULMONARY FIBROSIS Followed by pulmonary On oxygen 3 to 4 L Appears stable  Essential hypertension, benign Blood pressure is well controlled on today's visit. No changes made to the medications.     Total encounter time more than 25 minutes  Greater than 50% was spent in counseling and coordination of care with the patient   Disposition: Follow-up in 6 months   Signed, Ida Rogue, MD  03/23/2020 11:53 AM    [place] Office [address] [place] [address], [place], [place] [address]

## 2020-03-23 ENCOUNTER — Other Ambulatory Visit: Payer: Self-pay

## 2020-03-23 ENCOUNTER — Encounter: Payer: Self-pay | Admitting: Cardiovascular Disease

## 2020-03-23 ENCOUNTER — Ambulatory Visit (INDEPENDENT_AMBULATORY_CARE_PROVIDER_SITE_OTHER): Payer: Medicare HMO | Admitting: Cardiovascular Disease

## 2020-03-23 VITALS — BP 120/58 | HR 57 | Ht 65.0 in | Wt 263.5 lb

## 2020-03-23 DIAGNOSIS — I10 Essential (primary) hypertension: Secondary | ICD-10-CM | POA: Diagnosis not present

## 2020-03-23 DIAGNOSIS — I5032 Chronic diastolic (congestive) heart failure: Secondary | ICD-10-CM

## 2020-03-23 DIAGNOSIS — I25118 Atherosclerotic heart disease of native coronary artery with other forms of angina pectoris: Secondary | ICD-10-CM

## 2020-03-23 DIAGNOSIS — N183 Chronic kidney disease, stage 3 unspecified: Secondary | ICD-10-CM

## 2020-03-23 DIAGNOSIS — I2510 Atherosclerotic heart disease of native coronary artery without angina pectoris: Secondary | ICD-10-CM

## 2020-03-23 DIAGNOSIS — I270 Primary pulmonary hypertension: Secondary | ICD-10-CM | POA: Diagnosis not present

## 2020-03-23 NOTE — Patient Instructions (Signed)
Medication Instructions:  No changes  If you need a refill on your cardiac medications before your next appointment, please call your pharmacy.    Lab work: No new labs needed   If you have labs (blood work) drawn today and your tests are completely normal, you will receive your results only by: . MyChart Message (if you have MyChart) OR . A paper copy in the mail If you have any lab test that is abnormal or we need to change your treatment, we will call you to review the results.   Testing/Procedures: No new testing needed   Follow-Up: At [facility], you and your health needs are our priority.  As part of our continuing mission to provide you with exceptional heart care, we have created designated Provider Care Teams.  These Care Teams include your primary Cardiologist (physician) and Advanced Practice Providers (APPs -  Physician Assistants and Nurse Practitioners) who all work together to provide you with the care you need, when you need it.  . You will need a follow up appointment in 6 months  . Providers on your designated Care Team:   . Christopher Berge, NP . Ryan Dunn, PA-C . Jacquelyn Visser, PA-C  Any Other Special Instructions Will Be Listed Below (If Applicable).  COVID-19 Vaccine Information can be found at: [email or web address] For questions related to vaccine distribution or appointments, please email [email or web address] or call [phone].     

## 2020-03-27 DIAGNOSIS — J449 Chronic obstructive pulmonary disease, unspecified: Secondary | ICD-10-CM | POA: Diagnosis not present

## 2020-04-07 DIAGNOSIS — E113212 Type 2 diabetes mellitus with mild nonproliferative diabetic retinopathy with macular edema, left eye: Secondary | ICD-10-CM | POA: Diagnosis not present

## 2020-04-07 DIAGNOSIS — E113211 Type 2 diabetes mellitus with mild nonproliferative diabetic retinopathy with macular edema, right eye: Secondary | ICD-10-CM | POA: Diagnosis not present

## 2020-04-07 DIAGNOSIS — E113213 Type 2 diabetes mellitus with mild nonproliferative diabetic retinopathy with macular edema, bilateral: Secondary | ICD-10-CM | POA: Diagnosis not present

## 2020-04-26 DIAGNOSIS — J449 Chronic obstructive pulmonary disease, unspecified: Secondary | ICD-10-CM | POA: Diagnosis not present

## 2020-05-12 DIAGNOSIS — E113213 Type 2 diabetes mellitus with mild nonproliferative diabetic retinopathy with macular edema, bilateral: Secondary | ICD-10-CM | POA: Diagnosis not present

## 2020-05-13 ENCOUNTER — Encounter: Payer: Self-pay | Admitting: Pulmonary Disease

## 2020-05-13 ENCOUNTER — Ambulatory Visit: Payer: Medicare HMO | Admitting: Pulmonary Disease

## 2020-05-13 ENCOUNTER — Other Ambulatory Visit: Payer: Self-pay

## 2020-05-13 VITALS — BP 144/70 | HR 52 | Temp 97.8°F | Ht 65.0 in | Wt 269.0 lb

## 2020-05-13 DIAGNOSIS — J849 Interstitial pulmonary disease, unspecified: Secondary | ICD-10-CM

## 2020-05-13 DIAGNOSIS — E662 Morbid (severe) obesity with alveolar hypoventilation: Secondary | ICD-10-CM | POA: Diagnosis not present

## 2020-05-13 DIAGNOSIS — J4540 Moderate persistent asthma, uncomplicated: Secondary | ICD-10-CM

## 2020-05-13 DIAGNOSIS — J9611 Chronic respiratory failure with hypoxia: Secondary | ICD-10-CM

## 2020-05-13 NOTE — Patient Instructions (Signed)
Continue Trelegy as you are doing.  We will see you in follow-up in 4 months time call sooner should any new difficulties arise.

## 2020-05-14 ENCOUNTER — Other Ambulatory Visit: Payer: Self-pay | Admitting: Family Medicine

## 2020-05-14 ENCOUNTER — Other Ambulatory Visit: Payer: Self-pay | Admitting: Cardiovascular Disease

## 2020-05-14 NOTE — Telephone Encounter (Signed)
Pharmacy requests refill on: Accu-Chek Aviva Plus Strip  LAST REFILL: 07/01/2019 LAST OV: 03/19/2020 NEXT OV: 07/19/2020 PHARMACY: [facility] [place], [place]

## 2020-05-27 DIAGNOSIS — J449 Chronic obstructive pulmonary disease, unspecified: Secondary | ICD-10-CM | POA: Diagnosis not present

## 2020-05-31 DIAGNOSIS — N2581 Secondary hyperparathyroidism of renal origin: Secondary | ICD-10-CM | POA: Diagnosis not present

## 2020-05-31 DIAGNOSIS — I701 Atherosclerosis of renal artery: Secondary | ICD-10-CM | POA: Diagnosis not present

## 2020-05-31 DIAGNOSIS — E1122 Type 2 diabetes mellitus with diabetic chronic kidney disease: Secondary | ICD-10-CM | POA: Diagnosis not present

## 2020-05-31 DIAGNOSIS — R600 Localized edema: Secondary | ICD-10-CM | POA: Diagnosis not present

## 2020-05-31 DIAGNOSIS — N184 Chronic kidney disease, stage 4 (severe): Secondary | ICD-10-CM | POA: Diagnosis not present

## 2020-06-22 ENCOUNTER — Other Ambulatory Visit: Payer: Self-pay | Admitting: Cardiovascular Disease

## 2020-06-23 DIAGNOSIS — E113213 Type 2 diabetes mellitus with mild nonproliferative diabetic retinopathy with macular edema, bilateral: Secondary | ICD-10-CM | POA: Diagnosis not present

## 2020-06-26 DIAGNOSIS — J449 Chronic obstructive pulmonary disease, unspecified: Secondary | ICD-10-CM | POA: Diagnosis not present

## 2020-06-29 ENCOUNTER — Telehealth: Payer: Self-pay | Admitting: Family Medicine

## 2020-06-29 NOTE — Chronic Care Management (AMB) (Signed)
  Chronic Care Management   Note  06/29/2020 Name: Stephen [name] MRN: [id number] DOB: 08-Sep-1945  Stephen [name] is a 74 y.o. year old male who is a primary care patient of [name], MD. I reached out to [name] by phone today in response to a referral sent by Stephen [name] PCP, [name], MD.   Stephen [name] was given information about Chronic Care Management services today including:  1. CCM service includes personalized support from designated clinical staff supervised by his physician, including individualized plan of care and coordination with other care providers 2. 24/7 contact phone numbers for assistance for urgent and routine care needs. 3. Service will only be billed when office clinical staff spend 20 minutes or more in a month to coordinate care. 4. Only one practitioner may furnish and bill the service in a calendar month. 5. The patient may stop CCM services at any time (effective at the end of the month) by phone call to the office staff.   Stephen [name] verbally agreed to assistance and services provided by embedded care coordination/care management team today.  Follow up plan:   [place]

## 2020-07-06 ENCOUNTER — Encounter (INDEPENDENT_AMBULATORY_CARE_PROVIDER_SITE_OTHER): Payer: Medicare HMO

## 2020-07-12 ENCOUNTER — Other Ambulatory Visit (INDEPENDENT_AMBULATORY_CARE_PROVIDER_SITE_OTHER): Payer: Medicare HMO

## 2020-07-12 ENCOUNTER — Other Ambulatory Visit: Payer: Self-pay

## 2020-07-12 DIAGNOSIS — Z794 Long term (current) use of insulin: Secondary | ICD-10-CM

## 2020-07-12 DIAGNOSIS — E1121 Type 2 diabetes mellitus with diabetic nephropathy: Secondary | ICD-10-CM | POA: Diagnosis not present

## 2020-07-12 DIAGNOSIS — E039 Hypothyroidism, unspecified: Secondary | ICD-10-CM

## 2020-07-12 LAB — TSH: TSH: 7.42 u[IU]/mL — ABNORMAL HIGH (ref 0.35–4.50)

## 2020-07-12 LAB — HEMOGLOBIN A1C: Hgb A1c MFr Bld: 6.9 % — ABNORMAL HIGH (ref 4.6–6.5)

## 2020-07-19 ENCOUNTER — Ambulatory Visit: Payer: Medicare HMO | Admitting: Family Medicine

## 2020-07-21 ENCOUNTER — Telehealth: Payer: Self-pay

## 2020-07-21 NOTE — Telephone Encounter (Signed)
Agree with eval

## 2020-07-21 NOTE — Telephone Encounter (Signed)
[name] (DPR signed) left v/m that pt could not taste when he ate breakfast this morning and pt is not smelling anything either.I spoke with pt and he Has some head congestion and when blows nose has blood tinged mucus; pt is not sure what color the mucus is. H/A now is pain level of 4. Pt has a dry cough with SOB upon exertion. Oxygen level is 87 % on and off for the past wk. Now oxygen level is 98% at 6L of oxygen. For 2 wks pt has increased oxygen to 6 L instead of 3 - 4 liters due to difficulty breathing. Pt increased oxygen to 6 L on his own; no doctor is aware of the increase until now.Pt is not having problems breathing with oxygen at 6 L. Now. Pt will go to [facility] in [place] now. Pt declined 911 and his wife will drive pt to UC. Pt does not want to go to ED at this time. pts wife said their daughter has covid but has not seen their daughter. Pt does not have fever but is having chills. Sending note to Dr [name] as Juluis Rainier who is out of office and Dr Darnell Level who is in office.

## 2020-07-21 NOTE — Telephone Encounter (Signed)
Agree.  Thanks.  Will await consult note.

## 2020-07-26 ENCOUNTER — Other Ambulatory Visit: Payer: Self-pay

## 2020-07-26 ENCOUNTER — Encounter: Payer: Self-pay | Admitting: Family Medicine

## 2020-07-26 ENCOUNTER — Ambulatory Visit (INDEPENDENT_AMBULATORY_CARE_PROVIDER_SITE_OTHER): Payer: Medicare HMO | Admitting: Family Medicine

## 2020-07-26 DIAGNOSIS — R251 Tremor, unspecified: Secondary | ICD-10-CM

## 2020-07-26 DIAGNOSIS — E1121 Type 2 diabetes mellitus with diabetic nephropathy: Secondary | ICD-10-CM | POA: Diagnosis not present

## 2020-07-26 DIAGNOSIS — G8929 Other chronic pain: Secondary | ICD-10-CM | POA: Diagnosis not present

## 2020-07-26 DIAGNOSIS — I5032 Chronic diastolic (congestive) heart failure: Secondary | ICD-10-CM

## 2020-07-26 DIAGNOSIS — E039 Hypothyroidism, unspecified: Secondary | ICD-10-CM

## 2020-07-26 DIAGNOSIS — Z794 Long term (current) use of insulin: Secondary | ICD-10-CM

## 2020-07-26 DIAGNOSIS — M5442 Lumbago with sciatica, left side: Secondary | ICD-10-CM | POA: Diagnosis not present

## 2020-07-26 MED ORDER — HYDROCODONE-ACETAMINOPHEN 5-325 MG PO TABS
1.0000 | ORAL_TABLET | Freq: Four times a day (QID) | ORAL | 0 refills | Status: DC | PRN
Start: 2020-07-26 — End: 2021-04-30

## 2020-07-26 MED ORDER — LEVOTHYROXINE SODIUM 25 MCG PO TABS: ORAL_TABLET | ORAL | Status: DC

## 2020-07-26 MED ORDER — NOVOLIN 70/30 (70-30) 100 UNIT/ML ~~LOC~~ SUSP: 50.0000 [IU] | SUBCUTANEOUS | Status: DC

## 2020-07-26 NOTE — Progress Notes (Signed)
This visit occurred during the SARS-CoV-2 public health emergency.  Safety protocols were in place, including screening questions prior to the visit, additional usage of staff PPE, and extensive cleaning of exam room while observing appropriate contact time as indicated for disinfecting solutions.  Diabetes:  Using medications without difficulties: yes Hypoglycemic episodes:  Hyperglycemic episodes: No known Sugars been controlled on home check. A1c 6.9, d/w pt.    Hypothyroidism.  TSH 7.4.  Compliant with levothyroxine.  Still on 1 tab a day except 2 tabs Sunday and Wednesday.    Still on O2 at baseline.  We talked about using nasal saline prn for nasal congestion.  He has some occ O2 desats at rest when sitting upright.  This has been going on for about 3 weeks.  He had some sensation of episodic chest congestion.  No fevers.  No sputum.  No wheeze except for rare events.  Still on baseline inhalers. He does worse with cold air exposure.  He has not noted improvement with higher O2 flow rate.  He has recently tested neg for covid x2.    R hand tremor.  Noted episodically.  Does not happen at rest but he notes it when he is trying to manipulate an object.  It is worse when the object is light weight, like when he is holding a fork.  If he is holding something heavier, he does not notice it as much.  No left hand tremor.  No recent oxycodone use.  He had some nausea with med use.  He has back pain at baseline, he is putting up with that.  He was asking about options.  Meds, vitals, and allergies reviewed.   ROS: Per HPI unless specifically indicated in ROS section   GEN: nad, alert and oriented HEENT: ncat, on O2 at baseline. NECK: supple w/o LA CV: rrr. PULM: ctab, no inc wob ABD: soft, +bs EXT: 2+ edema SKIN: well perfused.  Right hand tremor noted holding papers but not otherwise.  No left hand tremor.

## 2020-07-26 NOTE — Patient Instructions (Signed)
Use the hydrocodone if needed for pain, instead of oxycodone.  See if that helps without causing nausea.   Let me check with pulmonary in the meantime.  Plan on recheck in about 4 months.  Labs ahead of time if possible.   Increase the thyroid medicine.  1 tab a day except for 2 tabs on MWF.  10 pills in a week.  Take care.  Glad to see you.

## 2020-07-27 ENCOUNTER — Other Ambulatory Visit (INDEPENDENT_AMBULATORY_CARE_PROVIDER_SITE_OTHER): Payer: Self-pay | Admitting: Nephrology

## 2020-07-27 DIAGNOSIS — N184 Chronic kidney disease, stage 4 (severe): Secondary | ICD-10-CM

## 2020-07-27 DIAGNOSIS — J449 Chronic obstructive pulmonary disease, unspecified: Secondary | ICD-10-CM | POA: Diagnosis not present

## 2020-07-27 DIAGNOSIS — I701 Atherosclerosis of renal artery: Secondary | ICD-10-CM

## 2020-07-28 ENCOUNTER — Other Ambulatory Visit: Payer: Self-pay

## 2020-07-28 ENCOUNTER — Ambulatory Visit (INDEPENDENT_AMBULATORY_CARE_PROVIDER_SITE_OTHER): Payer: Medicare HMO

## 2020-07-28 DIAGNOSIS — I701 Atherosclerosis of renal artery: Secondary | ICD-10-CM

## 2020-07-28 DIAGNOSIS — N184 Chronic kidney disease, stage 4 (severe): Secondary | ICD-10-CM

## 2020-07-28 DIAGNOSIS — E113213 Type 2 diabetes mellitus with mild nonproliferative diabetic retinopathy with macular edema, bilateral: Secondary | ICD-10-CM | POA: Diagnosis not present

## 2020-07-29 ENCOUNTER — Telehealth: Payer: Self-pay | Admitting: Family Medicine

## 2020-07-29 DIAGNOSIS — R251 Tremor, unspecified: Secondary | ICD-10-CM | POA: Insufficient documentation

## 2020-07-29 NOTE — Telephone Encounter (Signed)
Pt returning call.  I asked about current torsemide dosing.  Pt confirms he is alternating  1 vs 2 tabs daily.  I relayed Dr. Josefine Class message and instructions.  Pt verbalizes understanding and will call back with an update.

## 2020-07-29 NOTE — Telephone Encounter (Signed)
Tried to call patient but no answer and VM not set up yet

## 2020-07-29 NOTE — Assessment & Plan Note (Signed)
Adjusting his replacement upward to 25 mcg a day except for 50 mcg on Monday Wednesday Friday and we can recheck his TSH periodically.

## 2020-07-29 NOTE — Assessment & Plan Note (Signed)
Some options for medication for pain should not be used with his history of diabetes/CHF.  We talked about options.  He can try hydrocodone as needed and see if he can tolerate that when he has a flare of back pain.  Sedation caution given to patient.  He will update me as needed.

## 2020-07-29 NOTE — Telephone Encounter (Addendum)
Please update patient.  Please verify his torsemide dose.  My understanding is that he is alternating 1 versus 2 tablets every day.  If that is the case then I want him to try taking 2 tablets a day for the next 5 days and see if he notices a difference in his breathing and the swelling in his legs.  I want him to let me know how he was doing at that point.    I would not try to address his tremor until we can get this sorted out first.  Thanks.

## 2020-07-29 NOTE — Assessment & Plan Note (Signed)
This is more of an action tremor, not at rest.  It does not have the typical parkinsonian appearance.  I would observe this for now, until we can get the other issues described above addressed.

## 2020-07-29 NOTE — Assessment & Plan Note (Signed)
With normal O2 sat here but occasional desaturation noted when he is upright, sitting at home.  I checked with Dr. Patsey Berthold about this with pulmonary.  I think it makes sense to increase his diuresis, see following phone note.  If that does not improve his situation, then we can consider repeat echo/chest CT/referral back to cardiology.  I thank all involved.

## 2020-07-30 NOTE — Telephone Encounter (Signed)
Noted. Thanks.

## 2020-08-04 ENCOUNTER — Telehealth: Payer: Self-pay | Admitting: *Deleted

## 2020-08-04 DIAGNOSIS — R0602 Shortness of breath: Secondary | ICD-10-CM

## 2020-08-04 NOTE — Telephone Encounter (Signed)
Patient called stating that he took two fluid pills twice a day for 5 days. Patient stated that he only took one today because he needed to give his kidneys a rest. Patient stated that his oxygen level continues to drop when gets up moving around. Patient stated that his oxygen level usually runs between 93-94% but dropped to 88% last night. Patient stated that it dropped last night after he got out of the shower moving around. Patient stated that his oxygen level has been dropping when he gets up moving around. Patient stated that he has been fighting a cold for several months. Patient stated that his head has been stopped up. Patient stated that he has a history of asthma and has an appointment this month to see his pulmonologist. Patient was given ER precautions and he verbalized understanding.

## 2020-08-04 NOTE — Telephone Encounter (Signed)
Stephen [name], can we try to expedite his echo, And I can see him on any Wednesday AM to go over the results

## 2020-08-04 NOTE — Telephone Encounter (Signed)
I thank all involved. 

## 2020-08-04 NOTE — Telephone Encounter (Signed)
If the increased dose of torsemide did not produce a significant improvement, then I agree with going back and alternating 1 versus 2 tablets/day.  I think it makes sense to try to increase his flow rate by 1 L/min when he is up and active, to see if that helps with any desaturations with activity.  I think it makes sense to update cardiology and get their input regarding repeat echo versus other invasive studies.  I went ahead and put in the order for the echo.  Please call the patient about the increased flow rate of oxygen/echo and I will route this to cardiology, with appreciation.  I thank all involved.

## 2020-08-06 ENCOUNTER — Other Ambulatory Visit: Payer: Self-pay

## 2020-08-06 ENCOUNTER — Ambulatory Visit (INDEPENDENT_AMBULATORY_CARE_PROVIDER_SITE_OTHER): Payer: Medicare HMO

## 2020-08-06 DIAGNOSIS — R0602 Shortness of breath: Secondary | ICD-10-CM

## 2020-08-06 LAB — ECHOCARDIOGRAM COMPLETE
AR max vel: 2.87 cm2
AV Area VTI: 3.37 cm2
AV Area mean vel: 2.78 cm2
AV Mean grad: 3 mmHg
AV Peak grad: 6 mmHg
Ao pk vel: 1.22 m/s
Area-P 1/2: 2.26 cm2

## 2020-08-06 MED ORDER — PERFLUTREN LIPID MICROSPHERE
1.0000 mL | INTRAVENOUS | Status: AC | PRN
  Administered 2020-08-06: 2 mL via INTRAVENOUS

## 2020-08-10 ENCOUNTER — Telehealth: Payer: Self-pay | Admitting: Cardiovascular Disease

## 2020-08-10 ENCOUNTER — Telehealth: Payer: Self-pay

## 2020-08-10 NOTE — Telephone Encounter (Signed)
Was able to reach pt via phone, advised pt of Dr. Josefine Class reports as he is the provider who order the echo, he gets the report. Dr. [name] advised "echo did not look worse than previous" and would recommend a f/u appt with Dr. Rockey Situ for more detail report of echo. Pt placed on schedule for Thursday, 2/10 at 10:30am with Laurann Montana, NP. Pt and wife verbalized understanding. Will call back if anything changes, aware to arrive 7mins early for appt for check in.

## 2020-08-10 NOTE — Telephone Encounter (Signed)
Calling in for echo results from last week

## 2020-08-10 NOTE — Chronic Care Management (AMB) (Addendum)
Chronic Care Management Pharmacy Assistant   Name: Stephen [name]  MRN: [id number] DOB: October 18, 1945  Reason for Encounter: Initial questions for CCM visit scheduled 08/13/20  PCP : [name], MD  Allergies:   Allergies  Allergen Reactions   Calcium Channel Blockers     Would avoid if possible due to h/o peripheral edema    Medications: Outpatient Encounter Medications as of 08/10/2020  Medication Sig   ACCU-CHEK SOFTCLIX LANCETS lancets Use as instructed to check blood sugar three times daily and as needed.  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   acetaminophen (TYLENOL) 325 MG tablet Take 2 tablets (650 mg total) by mouth every 6 (six) hours as needed for mild pain (or Fever >/= 101).   albuterol (VENTOLIN HFA) 108 (90 Base) MCG/ACT inhaler Inhale 2 puffs into the lungs every 6 (six) hours as needed for wheezing or shortness of breath. Dispense 3 inhalers.   Alcohol Swabs (B-D SINGLE USE SWABS REGULAR) PADS Use to cleans area prior to checking blood sugar three times daily and as needed.  Diagnosis:  E11.22  Insulin dependent.   amLODipine (NORVASC) 5 MG tablet Take 1 tablet (5 mg total) by mouth daily.   aspirin 81 MG tablet Take 81 mg by mouth daily.   atorvastatin (LIPITOR) 80 MG tablet TAKE 1 TABLET AT BEDTIME   Blood Glucose Monitoring Suppl (ACCU-CHEK AVIVA PLUS) w/Device KIT Use to check blood sugar three times daily and as needed.  Diagnosis: E11.22  Insulin-dependent   carvedilol (COREG) 12.5 MG tablet TAKE 1 TABLET (12.5 MG TOTAL) BY MOUTH 2 (TWO) TIMES DAILY WITH A MEAL.   cloNIDine (CATAPRES) 0.3 MG tablet TAKE 1 TABLET THREE TIMES DAILY   clopidogrel (PLAVIX) 75 MG tablet TAKE 1 TABLET EVERY DAY   diclofenac sodium (VOLTAREN) 1 % GEL Apply 2 g topically 2 (two) times daily as needed (for left knee pain.).   diphenhydrAMINE (BENADRYL) 50 MG tablet Take 50 mg by mouth at bedtime.    docusate sodium (COLACE) 100 MG capsule Take 100 mg by mouth daily as needed for  mild constipation.    dorzolamide-timolol (COSOPT) 22.3-6.8 MG/ML ophthalmic solution    ezetimibe (ZETIA) 10 MG tablet TAKE 1 TABLET EVERY DAY   Fluticasone-Umeclidin-Vilant (TRELEGY ELLIPTA) 200-62.5-25 MCG/INH AEPB Inhale 1 puff into the lungs daily.   gabapentin (NEURONTIN) 400 MG capsule Take 2 tabs in the AM and 3 at night.   glucose blood (ACCU-CHEK AVIVA PLUS) test strip USE AS INSTRUCTED TO CHECK BLOOD SUGAR 3 TIMES DAILY OR AS NEEDED DX. E11.9   HYDROcodone-acetaminophen (NORCO/VICODIN) 5-325 MG tablet Take 1 tablet by mouth every 6 (six) hours as needed for moderate pain.   insulin NPH-regular Human (NOVOLIN 70/30) (70-30) 100 UNIT/ML injection Inject 50-70 Units into the skin See admin instructions. Inject 70u under the skin every morning and 45u under the skin at bedtime   Insulin Syringe-Needle U-100 (INSULIN SYRINGE 1CC/30GX5/16") 30G X 5/16" 1 ML MISC Use as directed to take insulin Dx E11.9   isosorbide mononitrate (IMDUR) 60 MG 24 hr tablet TAKE 1 AND 1/2 TABLETS TWICE DAILY   levothyroxine (SYNTHROID) 25 MCG tablet 1 tab a day except take 2 tablets on Monday Wednesday Friday. Total of 10 pills a week.   nitroGLYCERIN (NITROSTAT) 0.4 MG SL tablet Place 1 tablet (0.4 mg total) under the tongue every 5 (five) minutes as needed. May repeat x3   omeprazole (PRILOSEC) 20 MG capsule Take 20 mg by mouth daily.  polyethylene glycol powder (GLYCOLAX/MIRALAX) 17 GM/SCOOP powder Take 17 g by mouth daily as needed (for constipation).   potassium chloride (K-DUR) 10 MEQ tablet Take 1-2 tablets (10-20 mEq total) by mouth as directed. Alternating every other day. Take 1 tablet (10 mEq) daily and 2 tablets (20 mEq) daily   sildenafil (REVATIO) 20 MG tablet TAKE ONE TABLET BY MOUTH THREE TIMES A DAY   tamsulosin (FLOMAX) 0.4 MG CAPS capsule TAKE 1 CAPSULE (0.4 MG TOTAL) BY MOUTH AT BEDTIME.   torsemide (DEMADEX) 20 MG tablet Take 1-2 tablets (20-40 mg total) by mouth as directed. Alternate every  other day. 1 tablet (20 mg) daily and 2 tablets (40 mg) daily   No facility-administered encounter medications on file as of 08/10/2020.    Current Diagnosis: Patient Active Problem List   Diagnosis Date Noted   Tremor 07/29/2020   Hypoxia 03/21/2020   Left leg cellulitis 09/16/2019   Asthma 03/31/2019   Renal artery stenosis ([place]) 02/24/2019   Advance care planning 12/26/2018   Hypothyroidism 12/26/2018   HLD (hyperlipidemia) 12/26/2018   Coronary artery disease involving native coronary artery of native heart with angina pectoris ([place]) 10/09/2018   Pulmonary hypertension, primary ([place]) 09/26/2018   CHF (congestive heart failure) ([place]) 09/20/2018   Knee pain 03/26/2018   Encounter for chronic pain management 12/23/2017   Healthcare maintenance 09/21/2017   Skin lesion 06/15/2017   Acute on chronic renal failure ([place]) 01/15/2017   Coronary stent occlusion: Occluded circumflex stent 01/13/2017   Accelerated hypertension with diastolic congestive heart failure, NYHA class 3 ([place]); elevated LVEDP on chronic catheterization. 01/13/2017   NSTEMI (non-ST elevated myocardial infarction) ([place]) 01/11/2017   Blood in urine 12/08/2016   Lymphedema 10/23/2016   Low back pain with right-sided sciatica 04/03/2014   Muscle spasm 04/24/2013   Chronic edema 11/13/2012   Leg edema, right 09/29/2012   SK (seborrheic keratosis) 09/29/2012   Chronic diastolic CHF (congestive heart failure) ([place]) 12/15/2011   Sebaceous cyst 11/28/2011   Morbid obesity ([place]) 07/27/2011   Hyperlipidemia with target low density lipoprotein (LDL) cholesterol less than 70 mg/dL 04/26/2011   Adrenal adenoma 03/02/2011   Back pain 12/06/2010   Kidney stones 12/06/2010   Essential hypertension, benign 10/28/2010   PULMONARY FIBROSIS 09/15/2010   CAD (coronary artery disease) 09/01/2010   Type 2 diabetes mellitus with renal complication ([place]) 78/29/5621   METABOLIC SYNDROME X 30/86/5784   SLEEP APNEA, OBSTRUCTIVE  08/31/2010   Chronic kidney disease, stage III (moderate) ([place]) 08/31/2010     Have you seen any other providers since your last visit with PCP? No   Any changes in your medications or health? No  Any side effects from any medications? No  Do you have an symptoms or problems not managed by your medications? No  Any concerns about your health right now? Had ultrasound on heart. Concerned about results. O2 levels dropping low.  Has your provider asked that you check blood pressure, blood sugar, or follow special diet at home? Yes  States he checks blood pressure once in a while and checks blood sugar.   Do you get any type of exercise on a regular basis? No- but states active around the house and yard.   Can you think of a goal you would like to reach for your health? Yes - States he wants to feel better in general  Do you have any problems getting your medications? No - Uses [facility]  Is there anything that you would like  to discuss during the appointment? No  Stephen [name] was reminded to have all medications, supplements and any blood glucose and blood pressure readings available for review with Debbora Dus, Pharm. D, at his telephone visit on 08/13/20 at 8:30.   Follow-Up:  Pharmacist Review  Debbora Dus, CPP notified  Margaretmary Dys, [place] Assistant [phone]  I have reviewed the care management and care coordination activities outlined in this encounter and I am certifying that I agree with the content of this note. No further action required.  Debbora Dus, PharmD Clinical Pharmacist [facility] [phone]

## 2020-08-11 ENCOUNTER — Other Ambulatory Visit: Payer: Self-pay | Admitting: Family

## 2020-08-12 ENCOUNTER — Ambulatory Visit: Payer: Medicare HMO | Admitting: Family

## 2020-08-12 ENCOUNTER — Other Ambulatory Visit: Payer: Self-pay

## 2020-08-12 ENCOUNTER — Encounter: Payer: Self-pay | Admitting: Family

## 2020-08-12 VITALS — BP 138/70 | HR 57 | Ht 65.0 in | Wt 267.0 lb

## 2020-08-12 DIAGNOSIS — I270 Primary pulmonary hypertension: Secondary | ICD-10-CM

## 2020-08-12 DIAGNOSIS — I5033 Acute on chronic diastolic (congestive) heart failure: Secondary | ICD-10-CM

## 2020-08-12 DIAGNOSIS — N183 Chronic kidney disease, stage 3 unspecified: Secondary | ICD-10-CM | POA: Diagnosis not present

## 2020-08-12 DIAGNOSIS — I10 Essential (primary) hypertension: Secondary | ICD-10-CM | POA: Diagnosis not present

## 2020-08-12 DIAGNOSIS — I25118 Atherosclerotic heart disease of native coronary artery with other forms of angina pectoris: Secondary | ICD-10-CM | POA: Diagnosis not present

## 2020-08-12 DIAGNOSIS — R0609 Other forms of dyspnea: Secondary | ICD-10-CM

## 2020-08-12 DIAGNOSIS — R0600 Dyspnea, unspecified: Secondary | ICD-10-CM | POA: Diagnosis not present

## 2020-08-12 NOTE — Patient Instructions (Addendum)
Medication Instructions:  Continue your current medications.   You will continue to alternate Torsemide 1 tablet and 2 tablets every other day. If it is a day you are supposed to take 1 tablet, but your weight is up 2 pounds overnight or 5 pounds in one week, please take 2 tablets instead.   *If you need a refill on your cardiac medications before your next appointment, please call your pharmacy*   Lab Work: Your provider recommends that you return for lab work today: BMP, CBC  If you have labs (blood work) drawn today and your tests are completely normal, you will receive your results only by: Marland Kitchen MyChart Message (if you have MyChart) OR . A paper copy in the mail If you have any lab test that is abnormal or we need to change your treatment, we will call you to review the results.   Testing/Procedures: Your provider has requested that you have a cardiac catheterization. Cardiac catheterization is used to diagnose and/or treat various heart conditions. Doctors may recommend this procedure for a number of different reasons. The most common reason is to evaluate chest pain. Chest pain can be a symptom of coronary artery disease (CAD), and cardiac catheterization can show whether plaque is narrowing or blocking your heart's arteries. This procedure is also used to evaluate the valves, as well as measure the blood flow and oxygen levels in different parts of your heart.  Please follow instruction sheet, as given.     You are scheduled for a Cardiac Catheterization on Thursday, February 17 with Dr. Ida Rogue.  1. Please arrive at the [facility] at 8:30 AM (This time is ONE hour before your procedure to ensure your preparation). Free valet parking service is available.   Special note: Every effort is made to have your procedure done on time. Please understand that emergencies sometimes delay scheduled procedures.  2. Diet: Do not eat solid foods after midnight.  The patient may have clear  liquids until 5am upon the day of the procedure.  3. Labs: We drew your lab work needed today in clinic: CBC and Bmet  4. Medication instructions in preparation for your procedure:   Contrast Allergy: No  STOP taking Torsemide Wednesday 2/16 the DAY BEFORE your procedure  AND DO NOT take Torsemide the MORNING of your procedure.  INSULIN INSTRUCTIONS: Take only HALF dose the EVENING prior to your procedure.                                              HOLD Insulin the DAY OF your procedure.  DO NOT Take any oral diabetic medications the DAY OF your procedure.  On the morning of your procedure, take your Aspirin 81mg  (1 tablet only) and Plavix/Clopidogrel and any morning medicines NOT listed above.  You may use sips of water.  5. Plan for one night stay--bring personal belongings. 6. Bring a current list of your medications and current insurance cards. 7. You MUST have a responsible person to drive you home. 8. Someone MUST be with you the first 24 hours after you arrive home or your discharge will be delayed. 9. Please wear clothes that are easy to get on and off and wear slip-on shoes.  Thank you for allowing [place] to care for you!   -- [facility]   COVID PRE- TEST: You will  need a COVID TEST prior to the procedure:  LOCATION: [facility] Testing site.  DATE/TIME:  08/17/20 (anytime between 8 am and 1 pm)    Follow-Up: At [facility], you and your health needs are our priority.  As part of our continuing mission to provide you with exceptional heart care, we have created designated Provider Care Teams.  These Care Teams include your primary Cardiologist (physician) and Advanced Practice Providers (APPs -  Physician Assistants and Nurse Practitioners) who all work together to provide you with the care you need, when you need it.  We recommend signing up for the patient portal called "MyChart".  Sign up information  is provided on this After Visit Summary.  MyChart is used to connect with patients for Virtual Visits (Telemedicine).  Patients are able to view lab/test results, encounter notes, upcoming appointments, etc.  Non-urgent messages can be sent to your provider as well.   To learn more about what you can do with MyChart, go to [email or web address].    Your next appointment:   2 week(s) AFTER Cath procedure  The format for your next appointment:   In Person  Provider:   You may see Ida Rogue, MD or one of the following Advanced Practice Providers on your designated Care Team:    Murray Hodgkins, NP  Christell Faith, PA-C  Marrianne Mood, PA-C  Cadence Gratis, [place]  Laurann Montana, NP  Other Instructions  Weigh yourself daily and keep a log. Please bring log to your next appointment.

## 2020-08-12 NOTE — Addendum Note (Signed)
Addended by: Loel Dubonnet on: 08/12/2020 12:07 PM   Modules accepted: Orders, SmartSet

## 2020-08-12 NOTE — H&P (View-Only) (Signed)
Office Visit    Patient Name: Stephen [name] Date of Encounter: 08/12/2020  Primary Care Provider:  [name], MD Primary Cardiologist:  Stephen Rogue, MD Electrophysiologist:  None   Chief Complaint    Stephen [name] is a 75 y.o. male with a hx of pulmonary hypertension, chronic diastolic heart failure, CAD, pulmonary fibrosis s/p wedge resection, obesity, COPD on home O2, OSA on CPAP, DM2, CKD  presents today for follow up after echocardiogram   Past Medical History    Past Medical History:  Diagnosis Date  . Adrenal gland anomaly   . Arthritis   . CAD (coronary artery disease)    a. 04/2006 MI and PCI/stenting to mLCx & mRCA; b. 07/2009 Cath: patent LCX/RCA stents;  c. 12/2016 NSTEMI/Cath: LM 30, LAD 30p, 54m 30d, D1/2/3 min irregs, LCX 1033mSR, OM1 min irregs, RCA 20p, 10 ISR, 7054m0d, RPDA min irregs, RPAV 40-->Med Rx; d. 12/2017 MV: fixed lateral wall scar, mild anterior/anterior septal ischemia. EF 30-44%.  . CHF (congestive heart failure) ([facility] . CKD (chronic kidney disease), stage III ([facility] . Colon polyps   . DM2 (diabetes mellitus, type 2) (HCC)    insulin requiring  . Dyslipidemia   . GERD (gastroesophageal reflux disease)   . History of echocardiogram    a. TTE 12/2016: EF 50-55%, mod concentric LVH, images inadequate for wall motion assessment, not technically sufficienct to allow for LV dias fxn, calcified mitral annulus   . HTN (hypertension)   . Kidney stones   . Left Renal artery stenosis ([facility]  a. 07/2013 s/p PTA/stenting ([facility]); b. 07/2017 Renal Duplex: No significant RAS.  . MMarland Kitchenocardial infarction ([facility] . OSA (obstructive sleep apnea)   . Pulmonary fibrosis ([facility]  a. 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  . Recurrent UTI   . Rotator cuff disorder    right  . Skin cancer    head  . Thyroid disorder   . Venous insufficiency of both lower extremities    a. s/p laser treatment.   Past  Surgical History:  Procedure Laterality Date  . adrenal adenoma removal  1990-   right  . CARPAL TUNNEL RELEASE     bilateral  . CATARACT EXTRACTION W/PHACO Right 07/08/2018   Procedure: CATARACT EXTRACTION PHACO AND INTRAOCULAR LENS PLACEMENT (IOC) RIGHT DIABETIC;  Surgeon: KinEulogio [name];  [facility];  Laterality: Right;  Diabetic - insulin sleep apnea  . CATARACT EXTRACTION W/PHACO Left 08/05/2018   Procedure: CATARACT EXTRACTION PHACO AND INTRAOCULAR LENS PLACEMENT (IOCPoteauEFT DIABETIC;  Surgeon: KinEulogio [name];  [facility];  Laterality: Left;  DIABETIC  . CORONARY ANGIOPLASTY WITH STENT PLACEMENT  2007   x 2  . KNEE SURGERY     right  . LEFT HEART CATH AND CORONARY ANGIOGRAPHY N/A 01/11/2017   Procedure: Left Heart Cath and Coronary Angiography;  Surgeon: AriWellington HampshireD;  [facility];  Service: Cardiovascular;  Laterality: N/A;  . LUNG BIOPSY    . RENAL ARTERY STENT  2015   L  . SKIN CANCER EXCISION     back of head   Allergies  Allergies  Allergen Reactions  . Calcium Channel Blockers     Would avoid if possible due to h/o peripheral edema    History of Present Illness    Stephen Allred  [name] is a 75 y.o. male with a hx of pulmonary hypertension, chronic diastolic heart failure, CAD, pulmonary fibrosis s/p wedge resection, obesity, COPD on home O2, OSA on CPAP, DM2, CKD  last seen 03/23/2020 by Stephen [name].  Pleasant gentleman with a very complicated medical history.  CAD s/p PCI to mid RCA mid left circumflex October 2007.  Cardiac catheterization July 2018 with elevated left ventricle end-diastolic pressure.  He had a Myoview July 2019 with old region of infarct, possible region of ischemia though significant attenuation artifact.  Catheterization was deferred as he was asymptomatic and had renal dysfunction.   Echo 09/2018 with EF 45-50%, moderately  elevated right heart pressures estimated at 50 mmHg. He has a history of interstitial fibrosis on chronic oxygen needs s/p wedge resection.  He additionally had open lung biopsy November 2009 for bilateral pulmonary infiltrates.   He had renal artery stenosis and underwent left renal artery stent early 2020.  His heart failure therapies as well as cardiac catheterization have been limited by his renal function.  He was seen by primary care 07/29/20 with increasing lower extremity edema and dyspnea. He was recommended to take Torsemide 2 tabs daily for 5 days. He spoke with his PCP 08/04/20 and reported oxygen level dropping with ambulation despite 4 days of increased diuretics. Echocardiogram performed 08/06/20 showed LVEF 19-50%, grade 1 diastolic dysfunction, RV normal size and function, LA moderately dilated. Noted challenging images.   Presents today for follow-up after echocardiogram.  We reviewed the images in detail which were challenging as the showed no significant change compared to prior echo in 2020. Tells me his oxygen level was dropping "way down" just moving around the house. He was seeing it drop to 85-86% O2. Tells me this was associated with chest tightness. He has increased his home O2 to 5L and tells me it is a "hair better". He has been on the increased oxygen for about 1 month. Reports a little cough that is nonproductive going on for about a month that is about the same. Tells me it is not really bad, resolved with cough drop.   Tells me he started feeling lightheaded when taking the increased dose of Torsemide. Tells me his leg swelling went down a little. He denies orthopnea, PND.  No change in his dyspnea or oxygen saturation changes while on the increased dose of torsemide. Wife shares with me that he has been overdose with fluid pills before with the previous provider so he is understandably somewhat hesitant regarding increasing the dose.   EKGs/Labs/Other Studies Reviewed:   The  following studies were reviewed today: Echo 08/06/20 1. Left ventricular ejection fraction, by estimation, is 55 to 60%. The  left ventricle has normal function. The left ventricle has no regional  wall motion abnormalities. Left ventricular diastolic parameters are  consistent with Grade I diastolic  dysfunction (impaired relaxation).   2. Right ventricular systolic function is normal. The right ventricular  size is normal.   3. Left atrial size was moderately dilated.   4. Challenging images   EKG:  EKG is ordered today.  The ekg ordered today demonstrates sinus bradycardia 57 bpm with known LAFB and LVH with QRS widening and repolarization abnormality.  Stable T wave inversion noted in lead I, aVL.  No acute ST/T wave changes  Recent Labs: 07/12/2020: TSH 7.42  Recent Lipid Panel    Component Value Date/Time   CHOL 125 12/23/2018 0917   TRIG 142.0 12/23/2018 0917   HDL 27.70 (  L) 12/23/2018 0917   CHOLHDL 5 12/23/2018 0917   VLDL 28.4 12/23/2018 0917   LDLCALC 69 12/23/2018 0917    Home Medications   Current Meds  Medication Sig  . ACCU-CHEK SOFTCLIX LANCETS lancets Use as instructed to check blood sugar three times daily and as needed.  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  Marland Kitchen acetaminophen (TYLENOL) 325 MG tablet Take 2 tablets (650 mg total) by mouth every 6 (six) hours as needed for mild pain (or Fever >/= 101).  Marland Kitchen albuterol (VENTOLIN HFA) 108 (90 Base) MCG/ACT inhaler Inhale 2 puffs into the lungs every 6 (six) hours as needed for wheezing or shortness of breath. Dispense 3 inhalers.  . Alcohol Swabs (B-D SINGLE USE SWABS REGULAR) PADS Use to cleans area prior to checking blood sugar three times daily and as needed.  Diagnosis:  E11.22  Insulin dependent.  Marland Kitchen amLODipine (NORVASC) 5 MG tablet Take 1 tablet (5 mg total) by mouth daily.  Marland Kitchen aspirin 81 MG tablet Take 81 mg by mouth daily.  Marland Kitchen atorvastatin (LIPITOR) 80 MG tablet TAKE 1 TABLET AT BEDTIME  . Blood Glucose Monitoring  Suppl (ACCU-CHEK AVIVA PLUS) w/Device KIT Use to check blood sugar three times daily and as needed.  Diagnosis: E11.22  Insulin-dependent  . carvedilol (COREG) 12.5 MG tablet TAKE 1 TABLET (12.5 MG TOTAL) BY MOUTH 2 (TWO) TIMES DAILY WITH A MEAL.  . cloNIDine (CATAPRES) 0.3 MG tablet TAKE 1 TABLET THREE TIMES DAILY  . clopidogrel (PLAVIX) 75 MG tablet TAKE 1 TABLET EVERY DAY  . diclofenac sodium (VOLTAREN) 1 % GEL Apply 2 g topically 2 (two) times daily as needed (for left knee pain.).  Marland Kitchen diphenhydrAMINE (BENADRYL) 50 MG tablet Take 50 mg by mouth at bedtime.   . docusate sodium (COLACE) 100 MG capsule Take 100 mg by mouth daily as needed for mild constipation.   . dorzolamide-timolol (COSOPT) 22.3-6.8 MG/ML ophthalmic solution   . ezetimibe (ZETIA) 10 MG tablet TAKE 1 TABLET EVERY DAY  . Fluticasone-Umeclidin-Vilant (TRELEGY ELLIPTA) 200-62.5-25 MCG/INH AEPB Inhale 1 puff into the lungs daily.  Marland Kitchen gabapentin (NEURONTIN) 400 MG capsule Take 2 tabs in the AM and 3 at night.  Marland Kitchen glucose blood (ACCU-CHEK AVIVA PLUS) test strip USE AS INSTRUCTED TO CHECK BLOOD SUGAR 3 TIMES DAILY OR AS NEEDED DX. E11.9  . HYDROcodone-acetaminophen (NORCO/VICODIN) 5-325 MG tablet Take 1 tablet by mouth every 6 (six) hours as needed for moderate pain.  Marland Kitchen insulin NPH-regular Human (NOVOLIN 70/30) (70-30) 100 UNIT/ML injection Inject 50-70 Units into the skin See admin instructions. Inject 70u under the skin every morning and 45u under the skin at bedtime  . Insulin Syringe-Needle U-100 (INSULIN SYRINGE 1CC/30GX5/16") 30G X 5/16" 1 ML MISC Use as directed to take insulin Dx E11.9  . isosorbide mononitrate (IMDUR) 60 MG 24 hr tablet TAKE 1 AND 1/2 TABLETS TWICE DAILY  . levothyroxine (SYNTHROID) 25 MCG tablet 1 tab a day except take 2 tablets on Monday Wednesday Friday. Total of 10 pills a week.  . nitroGLYCERIN (NITROSTAT) 0.4 MG SL tablet Place 1 tablet (0.4 mg total) under the tongue every 5 (five) minutes as needed. May  repeat x3  . omeprazole (PRILOSEC) 20 MG capsule Take 20 mg by mouth daily.  . polyethylene glycol powder (GLYCOLAX/MIRALAX) 17 GM/SCOOP powder Take 17 g by mouth daily as needed (for constipation).  . potassium chloride (K-DUR) 10 MEQ tablet Take 1-2 tablets (10-20 mEq total) by mouth as directed. Alternating every other day. Take 1 tablet (  10 mEq) daily and 2 tablets (20 mEq) daily  . sildenafil (REVATIO) 20 MG tablet TAKE ONE TABLET BY MOUTH THREE TIMES A DAY  . tamsulosin (FLOMAX) 0.4 MG CAPS capsule TAKE 1 CAPSULE (0.4 MG TOTAL) BY MOUTH AT BEDTIME.  Marland Kitchen torsemide (DEMADEX) 20 MG tablet Take 1-2 tablets (20-40 mg total) by mouth as directed. Alternate every other day. 1 tablet (20 mg) daily and 2 tablets (40 mg) daily     Review of Systems   All other systems reviewed and are otherwise negative except as noted above.  Physical Exam    VS:  BP 138/70 (BP Location: Right Arm, Patient Position: Sitting, Cuff Size: Large)   Pulse (!) 57   Ht _0  (1.651 m)   Wt 267 lb (121.1 kg)   SpO2 97%   BMI 44.43 kg/m  , BMI Body mass index is 44.43 kg/m.  Wt Readings from Last 3 Encounters:  08/12/20 267 lb (121.1 kg)  07/26/20 268 lb (121.6 kg)  05/13/20 269 lb (122 kg)    GEN: Well nourished, overweight, well developed, in no acute distress. HEENT: normal. Neck: Supple, no JVD, carotid bruits, or masses. Cardiac: RRR, no murmurs, rubs, or gallops. No clubbing, cyanosis. Bilateral 1+ pitting edema..  Radials/PT 2+ and equal bilaterally.  Respiratory:  Respirations regular and unlabored. Diminished bases bilaterally likely due to body habitus.  GI: Soft, nontender, nondistended. MS: No deformity or atrophy. Skin: Warm and dry, no rash. Neuro:  Strength and sensation are intact. Psych: Normal affect.  Assessment & Plan    1. CAD -increasing dyspnea with exertion associated with chest tightness.  EKG today with no acute ST/T wave changes.  Previous LHC 12/2016 with significant two-vessel  coronary disease with occluded stent in mid left circumflex with collaterals noted from LAD and RCA, patent RCA stent with 70% stenosis distal to stent in mid segment and 50% stenosis in the distal segment.  Recommended for medical therapy at that time. In the setting of worsening dyspnea, chest pain, known pulmonary hypertension - plan for [facility] for assessment of worsening CAD vs worsening right heart pressures.  The risks [stroke (1 in 1000), death (1 in 17), kidney failure [usually temporary] (1 in 500), bleeding (1 in 200), allergic reaction [possibly serious] (1 in 200)], benefits (diagnostic support and management of coronary artery disease) and alternatives of a cardiac catheterization were discussed in detail with Mr. Ritson and he is willing to proceed. Lengthy discussion about risks to kidneys, will check CBC, BMP today.  GDMT includes aspirin, Atorvastatin, Coreg, Plavix, Zetia, Imdur.   2. Chronic diastolic heart failure -increasing dyspnea on exertion and oxygen desaturation over the last month.  He completed 5-day course of doubled dose of torsemide without much improvement.  Reports lower extremity edema stable at baseline, 1+ on exam today.  No orthopnea, PND.  Plan for [facility] as above. He will continue to alternate Torsemide 80m and 458mQOD. If he has weight gain of 2 lbs overnight or 5 lbs in 1 week he will take an additional 205mf Torsemide. Additional GDMT includes Carvedilol. Pending R/LHC results, further optimization of HF therapies could include Entresto.  3. 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 On chronic O2. Plan for [facility] for reassessment of pulmonary pressures. Will need prompt follow up with Dr. GonPatsey Berthold pulmonology and possibly [place]. GDMT includes Torsemide, Sildenafil.  4. HTN - BP mildly elevated. Not checking at  home. Will defer medication changes until after catheterization. Reported  some lightheadedness while taking increased dose of Torsemide - encouraged to check BP at home if this recurs. Likely some element of orthostatic hypotension.  5. CKDIII - Follows with Dr. Holley Raring. Careful titration of diuretics and antihypertensives. Careful monitoring of renal function with BMP today in setting of need for cardiac cath, as above.   6. DM2 - Continue to follow with primary care provider.   7. Hypothyroidism - Continue to follow with primary care.   8. ILD on O2/Asthma - Follows with pulmonology. No signs of acute asthma exacerbation.   Disposition: Follow up in 3 week(s) with Stephen [name] or APP   Signed, Loel Dubonnet, NP 08/12/2020, 11:34 AM [place]

## 2020-08-12 NOTE — Progress Notes (Signed)
Office Visit    Patient Name: CODYLEE PATIL Date of Encounter: 08/12/2020  Primary Care Provider:  [name], MD Primary Cardiologist:  Ida Rogue, MD Electrophysiologist:  None   Chief Complaint    FRANKIE SCIPIO is a 75 y.o. male with a hx of pulmonary hypertension, chronic diastolic heart failure, CAD, pulmonary fibrosis s/p wedge resection, obesity, COPD on home O2, OSA on CPAP, DM2, CKD  presents today for follow up after echocardiogram   Past Medical History    Past Medical History:  Diagnosis Date  . Adrenal gland anomaly   . Arthritis   . CAD (coronary artery disease)    a. 04/2006 MI and PCI/stenting to mLCx & mRCA; b. 07/2009 Cath: patent LCX/RCA stents;  c. 12/2016 NSTEMI/Cath: LM 30, LAD 30p, 54m 30d, D1/2/3 min irregs, LCX 1033mSR, OM1 min irregs, RCA 20p, 10 ISR, 7054m0d, RPDA min irregs, RPAV 40-->Med Rx; d. 12/2017 MV: fixed lateral wall scar, mild anterior/anterior septal ischemia. EF 30-44%.  . CHF (congestive heart failure) ([facility] . CKD (chronic kidney disease), stage III ([facility] . Colon polyps   . DM2 (diabetes mellitus, type 2) (HCC)    insulin requiring  . Dyslipidemia   . GERD (gastroesophageal reflux disease)   . History of echocardiogram    a. TTE 12/2016: EF 50-55%, mod concentric LVH, images inadequate for wall motion assessment, not technically sufficienct to allow for LV dias fxn, calcified mitral annulus   . HTN (hypertension)   . Kidney stones   . Left Renal artery stenosis ([facility]  a. 07/2013 s/p PTA/stenting ([place]); b. 07/2017 Renal Duplex: No significant RAS.  . MMarland Kitchenocardial infarction ([facility] . OSA (obstructive sleep apnea)   . Pulmonary fibrosis ([facility]  a. 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  . Recurrent UTI   . Rotator cuff disorder    right  . Skin cancer    head  . Thyroid disorder   . Venous insufficiency of both lower extremities    a. s/p laser treatment.   Past  Surgical History:  Procedure Laterality Date  . adrenal adenoma removal  1990-   right  . CARPAL TUNNEL RELEASE     bilateral  . CATARACT EXTRACTION W/PHACO Right 07/08/2018   Procedure: CATARACT EXTRACTION PHACO AND INTRAOCULAR LENS PLACEMENT (IOC) RIGHT DIABETIC;  Surgeon: KinEulogio BearD;  [facility];  Laterality: Right;  Diabetic - insulin sleep apnea  . CATARACT EXTRACTION W/PHACO Left 08/05/2018   Procedure: CATARACT EXTRACTION PHACO AND INTRAOCULAR LENS PLACEMENT (IOCPoteauEFT DIABETIC;  Surgeon: KinEulogio BearD;  [facility];  Laterality: Left;  DIABETIC  . CORONARY ANGIOPLASTY WITH STENT PLACEMENT  2007   x 2  . KNEE SURGERY     right  . LEFT HEART CATH AND CORONARY ANGIOGRAPHY N/A 01/11/2017   Procedure: Left Heart Cath and Coronary Angiography;  Surgeon: AriWellington HampshireD;  [facility];  Service: Cardiovascular;  Laterality: N/A;  . LUNG BIOPSY    . RENAL ARTERY STENT  2015   L  . SKIN CANCER EXCISION     back of head   Allergies  Allergies  Allergen Reactions  . Calcium Channel Blockers     Would avoid if possible due to h/o peripheral edema    History of Present Illness    RobTerrick Allred  Meister is a 75 y.o. male with a hx of pulmonary hypertension, chronic diastolic heart failure, CAD, pulmonary fibrosis s/p wedge resection, obesity, COPD on home O2, OSA on CPAP, DM2, CKD  last seen 03/23/2020 by Dr. Rockey Situ.  Pleasant gentleman with a very complicated medical history.  CAD s/p PCI to mid RCA mid left circumflex October 2007.  Cardiac catheterization July 2018 with elevated left ventricle end-diastolic pressure.  He had a Myoview July 2019 with old region of infarct, possible region of ischemia though significant attenuation artifact.  Catheterization was deferred as he was asymptomatic and had renal dysfunction.   Echo 09/2018 with EF 45-50%, moderately  elevated right heart pressures estimated at 50 mmHg. He has a history of interstitial fibrosis on chronic oxygen needs s/p wedge resection.  He additionally had open lung biopsy November 2009 for bilateral pulmonary infiltrates.   He had renal artery stenosis and underwent left renal artery stent early 2020.  His heart failure therapies as well as cardiac catheterization have been limited by his renal function.  He was seen by primary care 07/29/20 with increasing lower extremity edema and dyspnea. He was recommended to take Torsemide 2 tabs daily for 5 days. He spoke with his PCP 08/04/20 and reported oxygen level dropping with ambulation despite 4 days of increased diuretics. Echocardiogram performed 08/06/20 showed LVEF 19-50%, grade 1 diastolic dysfunction, RV normal size and function, LA moderately dilated. Noted challenging images.   Presents today for follow-up after echocardiogram.  We reviewed the images in detail which were challenging as the showed no significant change compared to prior echo in 2020. Tells me his oxygen level was dropping "way down" just moving around the house. He was seeing it drop to 85-86% O2. Tells me this was associated with chest tightness. He has increased his home O2 to 5L and tells me it is a "hair better". He has been on the increased oxygen for about 1 month. Reports a little cough that is nonproductive going on for about a month that is about the same. Tells me it is not really bad, resolved with cough drop.   Tells me he started feeling lightheaded when taking the increased dose of Torsemide. Tells me his leg swelling went down a little. He denies orthopnea, PND.  No change in his dyspnea or oxygen saturation changes while on the increased dose of torsemide. Wife shares with me that he has been overdose with fluid pills before with the previous provider so he is understandably somewhat hesitant regarding increasing the dose.   EKGs/Labs/Other Studies Reviewed:   The  following studies were reviewed today: Echo 08/06/20 1. Left ventricular ejection fraction, by estimation, is 55 to 60%. The  left ventricle has normal function. The left ventricle has no regional  wall motion abnormalities. Left ventricular diastolic parameters are  consistent with Grade I diastolic  dysfunction (impaired relaxation).   2. Right ventricular systolic function is normal. The right ventricular  size is normal.   3. Left atrial size was moderately dilated.   4. Challenging images   EKG:  EKG is ordered today.  The ekg ordered today demonstrates sinus bradycardia 57 bpm with known LAFB and LVH with QRS widening and repolarization abnormality.  Stable T wave inversion noted in lead I, aVL.  No acute ST/T wave changes  Recent Labs: 07/12/2020: TSH 7.42  Recent Lipid Panel    Component Value Date/Time   CHOL 125 12/23/2018 0917   TRIG 142.0 12/23/2018 0917   HDL 27.70 (  L) 12/23/2018 0917   CHOLHDL 5 12/23/2018 0917   VLDL 28.4 12/23/2018 0917   LDLCALC 69 12/23/2018 0917    Home Medications   Current Meds  Medication Sig  . ACCU-CHEK SOFTCLIX LANCETS lancets Use as instructed to check blood sugar three times daily and as needed.  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  Marland Kitchen acetaminophen (TYLENOL) 325 MG tablet Take 2 tablets (650 mg total) by mouth every 6 (six) hours as needed for mild pain (or Fever >/= 101).  Marland Kitchen albuterol (VENTOLIN HFA) 108 (90 Base) MCG/ACT inhaler Inhale 2 puffs into the lungs every 6 (six) hours as needed for wheezing or shortness of breath. Dispense 3 inhalers.  . Alcohol Swabs (B-D SINGLE USE SWABS REGULAR) PADS Use to cleans area prior to checking blood sugar three times daily and as needed.  Diagnosis:  E11.22  Insulin dependent.  Marland Kitchen amLODipine (NORVASC) 5 MG tablet Take 1 tablet (5 mg total) by mouth daily.  Marland Kitchen aspirin 81 MG tablet Take 81 mg by mouth daily.  Marland Kitchen atorvastatin (LIPITOR) 80 MG tablet TAKE 1 TABLET AT BEDTIME  . Blood Glucose Monitoring  Suppl (ACCU-CHEK AVIVA PLUS) w/Device KIT Use to check blood sugar three times daily and as needed.  Diagnosis: E11.22  Insulin-dependent  . carvedilol (COREG) 12.5 MG tablet TAKE 1 TABLET (12.5 MG TOTAL) BY MOUTH 2 (TWO) TIMES DAILY WITH A MEAL.  . cloNIDine (CATAPRES) 0.3 MG tablet TAKE 1 TABLET THREE TIMES DAILY  . clopidogrel (PLAVIX) 75 MG tablet TAKE 1 TABLET EVERY DAY  . diclofenac sodium (VOLTAREN) 1 % GEL Apply 2 g topically 2 (two) times daily as needed (for left knee pain.).  Marland Kitchen diphenhydrAMINE (BENADRYL) 50 MG tablet Take 50 mg by mouth at bedtime.   . docusate sodium (COLACE) 100 MG capsule Take 100 mg by mouth daily as needed for mild constipation.   . dorzolamide-timolol (COSOPT) 22.3-6.8 MG/ML ophthalmic solution   . ezetimibe (ZETIA) 10 MG tablet TAKE 1 TABLET EVERY DAY  . Fluticasone-Umeclidin-Vilant (TRELEGY ELLIPTA) 200-62.5-25 MCG/INH AEPB Inhale 1 puff into the lungs daily.  Marland Kitchen gabapentin (NEURONTIN) 400 MG capsule Take 2 tabs in the AM and 3 at night.  Marland Kitchen glucose blood (ACCU-CHEK AVIVA PLUS) test strip USE AS INSTRUCTED TO CHECK BLOOD SUGAR 3 TIMES DAILY OR AS NEEDED DX. E11.9  . HYDROcodone-acetaminophen (NORCO/VICODIN) 5-325 MG tablet Take 1 tablet by mouth every 6 (six) hours as needed for moderate pain.  Marland Kitchen insulin NPH-regular Human (NOVOLIN 70/30) (70-30) 100 UNIT/ML injection Inject 50-70 Units into the skin See admin instructions. Inject 70u under the skin every morning and 45u under the skin at bedtime  . Insulin Syringe-Needle U-100 (INSULIN SYRINGE 1CC/30GX5/16") 30G X 5/16" 1 ML MISC Use as directed to take insulin Dx E11.9  . isosorbide mononitrate (IMDUR) 60 MG 24 hr tablet TAKE 1 AND 1/2 TABLETS TWICE DAILY  . levothyroxine (SYNTHROID) 25 MCG tablet 1 tab a day except take 2 tablets on Monday Wednesday Friday. Total of 10 pills a week.  . nitroGLYCERIN (NITROSTAT) 0.4 MG SL tablet Place 1 tablet (0.4 mg total) under the tongue every 5 (five) minutes as needed. May  repeat x3  . omeprazole (PRILOSEC) 20 MG capsule Take 20 mg by mouth daily.  . polyethylene glycol powder (GLYCOLAX/MIRALAX) 17 GM/SCOOP powder Take 17 g by mouth daily as needed (for constipation).  . potassium chloride (K-DUR) 10 MEQ tablet Take 1-2 tablets (10-20 mEq total) by mouth as directed. Alternating every other day. Take 1 tablet (  10 mEq) daily and 2 tablets (20 mEq) daily  . sildenafil (REVATIO) 20 MG tablet TAKE ONE TABLET BY MOUTH THREE TIMES A DAY  . tamsulosin (FLOMAX) 0.4 MG CAPS capsule TAKE 1 CAPSULE (0.4 MG TOTAL) BY MOUTH AT BEDTIME.  Marland Kitchen torsemide (DEMADEX) 20 MG tablet Take 1-2 tablets (20-40 mg total) by mouth as directed. Alternate every other day. 1 tablet (20 mg) daily and 2 tablets (40 mg) daily     Review of Systems   All other systems reviewed and are otherwise negative except as noted above.  Physical Exam    VS:  BP 138/70 (BP Location: Right Arm, Patient Position: Sitting, Cuff Size: Large)   Pulse (!) 57   Ht _0  (1.651 m)   Wt 267 lb (121.1 kg)   SpO2 97%   BMI 44.43 kg/m  , BMI Body mass index is 44.43 kg/m.  Wt Readings from Last 3 Encounters:  08/12/20 267 lb (121.1 kg)  07/26/20 268 lb (121.6 kg)  05/13/20 269 lb (122 kg)    GEN: Well nourished, overweight, well developed, in no acute distress. HEENT: normal. Neck: Supple, no JVD, carotid bruits, or masses. Cardiac: RRR, no murmurs, rubs, or gallops. No clubbing, cyanosis. Bilateral 1+ pitting edema..  Radials/PT 2+ and equal bilaterally.  Respiratory:  Respirations regular and unlabored. Diminished bases bilaterally likely due to body habitus.  GI: Soft, nontender, nondistended. MS: No deformity or atrophy. Skin: Warm and dry, no rash. Neuro:  Strength and sensation are intact. Psych: Normal affect.  Assessment & Plan    1. CAD -increasing dyspnea with exertion associated with chest tightness.  EKG today with no acute ST/T wave changes.  Previous LHC 12/2016 with significant two-vessel  coronary disease with occluded stent in mid left circumflex with collaterals noted from LAD and RCA, patent RCA stent with 70% stenosis distal to stent in mid segment and 50% stenosis in the distal segment.  Recommended for medical therapy at that time. In the setting of worsening dyspnea, chest pain, known pulmonary hypertension - plan for [facility] for assessment of worsening CAD vs worsening right heart pressures.  The risks [stroke (1 in 1000), death (1 in 17), kidney failure [usually temporary] (1 in 500), bleeding (1 in 200), allergic reaction [possibly serious] (1 in 200)], benefits (diagnostic support and management of coronary artery disease) and alternatives of a cardiac catheterization were discussed in detail with Mr. Ritson and he is willing to proceed. Lengthy discussion about risks to kidneys, will check CBC, BMP today.  GDMT includes aspirin, Atorvastatin, Coreg, Plavix, Zetia, Imdur.   2. Chronic diastolic heart failure -increasing dyspnea on exertion and oxygen desaturation over the last month.  He completed 5-day course of doubled dose of torsemide without much improvement.  Reports lower extremity edema stable at baseline, 1+ on exam today.  No orthopnea, PND.  Plan for [facility] as above. He will continue to alternate Torsemide 80m and 458mQOD. If he has weight gain of 2 lbs overnight or 5 lbs in 1 week he will take an additional 205mf Torsemide. Additional GDMT includes Carvedilol. Pending R/LHC results, further optimization of HF therapies could include Entresto.  3. 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 On chronic O2. Plan for [facility] for reassessment of pulmonary pressures. Will need prompt follow up with Dr. GonPatsey Berthold pulmonology and possibly [place]ine [place]. GDMT includes Torsemide, Sildenafil.  4. HTN - BP mildly elevated. Not checking at  home. Will defer medication changes until after catheterization. Reported  some lightheadedness while taking increased dose of Torsemide - encouraged to check BP at home if this recurs. Likely some element of orthostatic hypotension.  5. CKDIII - Follows with Dr. Holley Raring. Careful titration of diuretics and antihypertensives. Careful monitoring of renal function with BMP today in setting of need for cardiac cath, as above.   6. DM2 - Continue to follow with primary care provider.   7. Hypothyroidism - Continue to follow with primary care.   8. ILD on O2/Asthma - Follows with pulmonology. No signs of acute asthma exacerbation.   Disposition: Follow up in 3 week(s) with Dr. Rockey Situ or APP   Signed, Loel Dubonnet, NP 08/12/2020, 11:34 AM [place]

## 2020-08-13 ENCOUNTER — Telehealth: Payer: Self-pay | Admitting: Family

## 2020-08-13 ENCOUNTER — Ambulatory Visit (INDEPENDENT_AMBULATORY_CARE_PROVIDER_SITE_OTHER): Payer: Medicare HMO

## 2020-08-13 ENCOUNTER — Telehealth: Payer: Self-pay

## 2020-08-13 ENCOUNTER — Other Ambulatory Visit: Payer: Self-pay | Admitting: Family Medicine

## 2020-08-13 DIAGNOSIS — Z794 Long term (current) use of insulin: Secondary | ICD-10-CM

## 2020-08-13 DIAGNOSIS — E1121 Type 2 diabetes mellitus with diabetic nephropathy: Secondary | ICD-10-CM

## 2020-08-13 DIAGNOSIS — I10 Essential (primary) hypertension: Secondary | ICD-10-CM

## 2020-08-13 DIAGNOSIS — E785 Hyperlipidemia, unspecified: Secondary | ICD-10-CM | POA: Diagnosis not present

## 2020-08-13 DIAGNOSIS — I5032 Chronic diastolic (congestive) heart failure: Secondary | ICD-10-CM

## 2020-08-13 LAB — CBC WITH DIFFERENTIAL/PLATELET
Basophils Absolute: 0.1 10*3/uL (ref 0.0–0.2)
Basos: 1 %
EOS (ABSOLUTE): 0.3 10*3/uL (ref 0.0–0.4)
Eos: 4 %
Hematocrit: 40.8 % (ref 37.5–51.0)
Hemoglobin: 13.5 g/dL (ref 13.0–17.7)
Immature Grans (Abs): 0 10*3/uL (ref 0.0–0.1)
Immature Granulocytes: 0 %
Lymphocytes Absolute: 0.8 10*3/uL (ref 0.7–3.1)
Lymphs: 10 %
MCH: 28.8 pg (ref 26.6–33.0)
MCHC: 33.1 g/dL (ref 31.5–35.7)
MCV: 87 fL (ref 79–97)
Monocytes Absolute: 0.7 10*3/uL (ref 0.1–0.9)
Monocytes: 9 %
Neutrophils Absolute: 5.6 10*3/uL (ref 1.4–7.0)
Neutrophils: 76 %
Platelets: 195 10*3/uL (ref 150–450)
RBC: 4.69 x10E6/uL (ref 4.14–5.80)
RDW: 13.7 % (ref 11.6–15.4)
WBC: 7.4 10*3/uL (ref 3.4–10.8)

## 2020-08-13 LAB — BASIC METABOLIC PANEL
BUN/Creatinine Ratio: 11 (ref 10–24)
BUN: 28 mg/dL — ABNORMAL HIGH (ref 8–27)
CO2: 24 mmol/L (ref 20–29)
Calcium: 8.6 mg/dL (ref 8.6–10.2)
Chloride: 112 mmol/L — ABNORMAL HIGH (ref 96–106)
Creatinine, Ser: 2.55 mg/dL — ABNORMAL HIGH (ref 0.76–1.27)
GFR calc Af Amer: 28 mL/min/{1.73_m2} — ABNORMAL LOW (ref >59)
GFR calc non Af Amer: 24 mL/min/{1.73_m2} — ABNORMAL LOW (ref >59)
Glucose: 31 mg/dL — CL (ref 65–99)
Potassium: 4.1 mmol/L (ref 3.5–5.2)
Sodium: 148 mmol/L — ABNORMAL HIGH (ref 134–144)

## 2020-08-13 MED ORDER — NOVOLIN 70/30 (70-30) 100 UNIT/ML ~~LOC~~ SUSP: 40.0000 [IU] | SUBCUTANEOUS | Status: DC

## 2020-08-13 NOTE — Telephone Encounter (Signed)
-----   Message from Loel Dubonnet, NP sent at 08/13/2020  8:47 AM EST ----- Lab work from yesterday shows critically low glucose. Attempted to call x1 with no answer and no VM set up. Phone encounter started. Will continue our attempts to reach him with assistance of our triage team. CC'd his PCP as an [place] who manages his diabetic regimen.   When we reach him - need to have him check his blood glucose and encourage regular meals.   Remainder of lab work shows overall stable renal function. No evidence of anemia nor infection.

## 2020-08-13 NOTE — Telephone Encounter (Signed)
I called and spoke with the patient regarding his lab results (BMP/ CBC). I have advised him his blood glucose at the time of his lab draw yesterday was 31. I inquired if the patient could tell his sugar was low at that time.  Per the patient, he was feeling that way a little bit. He reports he and his wife left the office and do go get something to eat.  He has not check his blood sugar since then. He states he got up late this morning and ate.  I have asked him to please go ahead and check his sugar now.  He is aware copy of his lab work was reviewed by Dr. [name] (PCP) and that his office should be calling him to discuss changes to his insulin.  I asked if he did not hear from them in the next hour or so, to please call them to discuss insulin changes and report his CBG reading.  The patient voices understanding and is agreeable.

## 2020-08-13 NOTE — Telephone Encounter (Signed)
Attempted to reach the patient x1. No answer, unable to lmom. Patients voicemail is not set up.

## 2020-08-13 NOTE — Progress Notes (Signed)
Chronic Care Management Pharmacy Note  08/13/2020 Name:  Stephen [name] MRN:  [id number] DOB:  [birth year]  Subjective: JAICE [name] is an 75 y.o. year old male who is a primary patient of [name], Elveria Rising, MD.  The CCM team was consulted for assistance with disease management and care coordination needs.    Engaged with patient by telephone for follow up visit in response to provider referral for pharmacy case management and/or care coordination services.   Consent to Services:  The patient was given information about Chronic Care Management services, agreed to services, and gave verbal consent prior to initiation of services.  Please see initial visit note for detailed documentation.   Patient Care Team: [name], MD as PCP - General (Family Medicine) Minna Merritts, MD as PCP - Cardiology (Cardiology) Alisa Graff, FNP as Nurse Practitioner (Family Medicine) Debbora Dus, [facility] as Pharmacist (Pharmacist) Debbora Dus, [facility] as Pharmacist (Pharmacist)  Recent medication changes reviewed: Patient reports taking 1 aspirin instead of 4 daily. Confirms increasing levothyroxine as prescribed on 1/24 by PCP and taking torsemide as prescribed, alternating Torsemide 20mg  and 40mg  QOD. Denies using hydrocodone yet, recently changed from oxycodone. Denies other medication changes in the past several months.  Recent office visits:  07/26/20 - PCP - DM with A1c 6.9%, hypothyroidism TSH 7.4, compliant to levothyroxine. Adjusting his replacement upward to 25 mcg a day except for 50 mcg on Monday Wednesday Friday. Back pain, off oxycodone, asking about options. Try hydrocodone PRN. Action tremor, not at rest. Monitor for now. increasing lower extremity edema and dyspnea. He was recommended to take Torsemide 2 tabs daily for 5 days.   03/19/20 - PCP   Recent consult visits:  08/14/19 - Cardiology - CAD with plan for cardiac catheterization, on  aspirin, Atorvastatin, Coreg,  Plavix, Zetia, Imdur. CHF with plan to continue to alternate Torsemide 20mg  and 40mg  QOD. If he has weight gain of 2 lbs overnight or 5 lbs in 1 week he will take an additional 20mg  of Torsemide. 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 On chronic O2. Plan for RHC for reassessment of pulmonary pressures. Will need prompt follow up with Dr. Patsey Berthold of pulmonology and possibly referral to advanced heart failure clinic in [place] with Dr. Haroldine Laws pending results. GDMT includes Torsemide, Sildenafil. HTN - BP mildly elevated. Not checking at home. Will defer medication changes until after catheterization. Reported some lightheadedness while taking increased dose of Torsemide. RTC 3 weeks, Dr. Rockey Situ.  05/31/20 - Nephrology - Hypertension. Maintain the patient on carvedilol, clonidine, triamterene/HCTZ, and valsartan/HCTZ. 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 Patient has advanced renal dysfunction with most recent EGFR 25 with a urine protein to creatinine ratio 4.8. He is maintained on valsartan for renal protection as well as proteinuria suppression. Most recent hemoglobin A1c was slightly high at 7.4. We do plan to follow-up renal parameters today.  05/13/20 - Pulmonology - Continue Trelegy   03/23/20 - Cardiology   01/26/20 - Nephrology   01/22/20 [facility] visits: None in previous 6 months  Objective:  Lab Results  Component Value Date   CREATININE 2.55 (H) 08/12/2020   BUN 28 (H) 08/12/2020   GFR 30.42 (L) 07/01/2019   GFRNONAA 24 (L) 08/12/2020   GFRAA 28 (L) 08/12/2020   NA 148 (H) 08/12/2020   K 4.1 08/12/2020   CALCIUM 8.6 08/12/2020   CO2 24 08/12/2020    Lab Results  Component Value Date/Time   HGBA1C 6.9 (H) 07/12/2020 09:34 AM   HGBA1C 7.4 (H) 03/16/2020 09:56 AM   GFR 30.42 (L) 07/01/2019 11:19 AM   GFR 36.92 (L) 12/23/2018 09:17 AM   MICROALBUR 221.2 Repeated and verified X2. (H) 09/15/2013 10:09  AM    Last diabetic Eye exam:  Lab Results  Component Value Date/Time   HMDIABEYEEXA Retinopathy (A) 02/25/2020 12:00 AM    Last diabetic Foot exam: 03/19/20  Lab Results  Component Value Date   CHOL 125 12/23/2018   HDL 27.70 (L) 12/23/2018   LDLCALC 69 12/23/2018   TRIG 142.0 12/23/2018   CHOLHDL 5 12/23/2018    Hepatic Function Latest Ref Rng & Units 12/23/2018 09/20/2018 12/18/2017  Total Protein 6.0 - 8.3 g/dL 5.7(L) 6.5 6.3  Albumin 3.5 - 5.2 g/dL 3.1(L) 2.7(L) 3.2(L)  AST 0 - 37 U/L $Remo'13 31 15  'bvVcP$ ALT 0 - 53 U/L $Remo'13 26 18  'BRvBn$ Alk Phosphatase 39 - 117 U/L 86 81 78  Total Bilirubin 0.2 - 1.2 mg/dL 0.5 0.6 0.5    Lab Results  Component Value Date/Time   TSH 7.42 (H) 07/12/2020 09:34 AM   TSH 4.93 (H) 03/16/2020 09:56 AM    CBC Latest Ref Rng & Units 08/12/2020 09/21/2018 09/20/2018  WBC 3.4 - 10.8 x10E3/uL 7.4 10.5 13.8(H)  Hemoglobin 13.0 - 17.7 g/dL 13.5 14.1 15.0  Hematocrit 37.5 - 51.0 % 40.8 43.3 46.3  Platelets 150 - 450 x10E3/uL 195 209 195   No results found for: VD25OH  Clinical ASCVD: Yes  The ASCVD Risk score Mikey Bussing DC Jr., et al., 2013) failed to calculate for the following reasons:   The patient has a prior MI or stroke diagnosis    Depression screen [facility] 2/9 07/26/2020 02/26/2019 12/20/2018  Decreased Interest 0 0 0  Down, Depressed, Hopeless 0 0 0  PHQ - 2 Score 0 0 0  Altered sleeping - - 0  Tired, decreased energy - - 0  Change in appetite - - 0  Feeling bad or failure about yourself  - - 0  Trouble concentrating - - 0  Moving slowly or fidgety/restless - - 0  Suicidal thoughts - - 0  PHQ-9 Score - - 0  Difficult doing work/chores - - Not difficult at all  Some recent data might be hidden    Echo - 08/06/20 showed LVEF 55-60%  Social History   Tobacco Use  Smoking Status Never Smoker  Smokeless Tobacco Never Used   BP Readings from Last 3 Encounters:  08/12/20 138/70  07/26/20 (!) 144/82  05/13/20 (!) 144/70   Pulse Readings from Last 3  Encounters:  08/12/20 (!) 57  07/26/20 60  05/13/20 (!) 52   Wt Readings from Last 3 Encounters:  08/12/20 267 lb (121.1 kg)  07/26/20 268 lb (121.6 kg)  05/13/20 269 lb (122 kg)    Assessment/Interventions: Review of patient past medical history, allergies, medications, health status, including review of consultants reports, laboratory and other test data, was performed as part of comprehensive evaluation and provision of chronic care management services.   SDOH:  (Social Determinants of Health) assessments and interventions performed: Yes SDOH Interventions   Flowsheet Row Most Recent Value  SDOH Interventions   Financial Strain Interventions Intervention Not Indicated  [Medications affordable]      CCM Care Plan  Allergies  Allergen Reactions  . Calcium Channel Blockers     Would avoid if possible due to h/o peripheral edema    Medications Reviewed  Today    Reviewed by Debbora Dus, [facility] (Pharmacist) on 08/13/20 at [phone]  Med List Status: <None>  Medication Order Taking? Sig Documenting Provider Last Dose Status Informant  ACCU-CHEK SOFTCLIX LANCETS lancets [id number] No Use as instructed to check blood sugar three times daily and as needed.  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 [name], MD Taking Active Spouse/Significant Other  acetaminophen (TYLENOL) 325 MG tablet [id number] No Take 2 tablets (650 mg total) by mouth every 6 (six) hours as needed for mild pain (or Fever >/= 101). Nicholes Mango, MD Taking Active Spouse/Significant Other  albuterol (VENTOLIN HFA) 108 (90 Base) MCG/ACT inhaler [id number] No Inhale 2 puffs into the lungs every 6 (six) hours as needed for wheezing or shortness of breath. Dispense 3 inhalers. [name], MD Taking Active Spouse/Significant Other  Alcohol Swabs (B-D SINGLE USE SWABS REGULAR) PADS [id number] No Use to cleans area prior to checking blood sugar three times daily and as needed.  Diagnosis:  E11.22  Insulin dependent. [name], MD Taking Active Spouse/Significant Other  aspirin EC 81 MG tablet [id number]  Take 324 mg by mouth daily. Swallow whole. [provider]  Active Spouse/Significant Other  atorvastatin (LIPITOR) 80 MG tablet [id number] No TAKE 1 TABLET AT BEDTIME [name], MD Taking Active   Blood Glucose Monitoring Suppl (ACCU-CHEK AVIVA PLUS) w/Device KIT [id number] No Use to check blood sugar three times daily and as needed.  Diagnosis: E11.22  Insulin-dependent [name], MD Taking Active Spouse/Significant Other  carvedilol (COREG) 12.5 MG tablet [id number] No TAKE 1 TABLET (12.5 MG TOTAL) BY MOUTH 2 (TWO) TIMES DAILY WITH A MEAL.  Patient taking differently: Take 12.5 mg by mouth 2 (two) times daily with a meal. (0800 & 2000)   [name], MD Taking Active Spouse/Significant Other  cloNIDine (CATAPRES) 0.3 MG tablet [id number] No TAKE 1 TABLET THREE TIMES DAILY  Patient taking differently: Take 0.3 mg by mouth 3 (three) times daily.   Minna Merritts, MD Taking Active Spouse/Significant Other  clopidogrel (PLAVIX) 75 MG tablet [id number] No TAKE 1 TABLET EVERY DAY  Patient taking differently: Take 75 mg by mouth daily.   Loel Dubonnet, NP Taking Active Spouse/Significant Other  diphenhydrAMINE (BENADRYL) 50 MG tablet [id number] No Take 50 mg by mouth at bedtime as needed for allergies. [provider] Taking Active Spouse/Significant Other           Med Note Tamala Julian, Delfino Lovett   Fri Sep 20, 2018  9:40 AM)    dorzolamide-timolol (COSOPT) 22.3-6.8 MG/ML ophthalmic solution [id number] No Place 1 drop into both eyes in the morning and at bedtime. [provider] Taking Active Spouse/Significant Other  ezetimibe (ZETIA) 10 MG tablet [id number] No TAKE 1 TABLET EVERY DAY  Patient taking differently: Take 10 mg by mouth every evening.   Minna Merritts, MD Taking Active Spouse/Significant Other  Fluticasone-Umeclidin-Vilant (TRELEGY ELLIPTA) 200-62.5-25 MCG/INH  AEPB [id number] No Inhale 1 puff into the lungs daily. Tyler Pita, MD Taking Active Spouse/Significant Other  gabapentin (NEURONTIN) 400 MG capsule [id number] No Take 2 tabs in the AM and 3 at night.  Patient taking differently: Take 800-1,200 mg by mouth See admin instructions. Take 2 capsules (800 mg) by mouth in the morning & take 3 capsules (1200 mg) by mouth at night.   [name], MD Taking Active Spouse/Significant Other  glucose blood (ACCU-CHEK AVIVA PLUS) test strip [id number] No USE AS INSTRUCTED TO CHECK BLOOD SUGAR 3 TIMES DAILY  OR AS NEEDED DX. E11.9 [name], MD Taking Active Spouse/Significant Other  HYDROcodone-acetaminophen (NORCO/VICODIN) 5-325 MG tablet [id number] No Take 1 tablet by mouth every 6 (six) hours as needed for moderate pain. [name], MD Taking Active Spouse/Significant Other  insulin NPH-regular Human (NOVOLIN 70/30) (70-30) 100 UNIT/ML injection [id number]  Inject 40-60 Units into the skin See admin instructions. Inject 60 units subcutaneously in the morning  & inject 40 units subcutaneously at bedtime. [name], MD  Active   Insulin Syringe-Needle U-100 (INSULIN SYRINGE 1CC/30GX5/16") 30G X 5/16" 1 ML MISC [id number] No Use as directed to take insulin Dx E11.9 [name], MD Taking Active Spouse/Significant Other  isosorbide mononitrate (IMDUR) 60 MG 24 hr tablet [id number] No TAKE 1 AND 1/2 TABLETS TWICE DAILY  Patient taking differently: Take 90 mg by mouth in the morning and at bedtime. (0800 & 2000)   Gollan, Kathlene November, MD Taking Active Spouse/Significant Other  levothyroxine (SYNTHROID) 25 MCG tablet [id number] No 1 tab a day except take 2 tablets on Monday Wednesday Friday. Total of 10 pills a week.  Patient taking differently: Take 25-50 mcg by mouth See admin instructions. Take 2 tablets (50 mcg) by mouth on Mondays, Wednesdays, & Fridays in the morning. Take 1 tablet (25 mcg) by mouth on Sundays, Tuesdays, Thursdays &  Saturdays in the morning.   [name], MD Taking Active Spouse/Significant Other  nitroGLYCERIN (NITROSTAT) 0.4 MG SL tablet [id number] No Place 1 tablet (0.4 mg total) under the tongue every 5 (five) minutes as needed. May repeat x3  Patient taking differently: Place 0.4 mg under the tongue every 5 (five) minutes x 3 doses as needed for chest pain.   [name], MD Taking Active Spouse/Significant Other  omeprazole (PRILOSEC) 20 MG capsule [id number] No Take 20 mg by mouth daily before breakfast. [provider] Taking Active Spouse/Significant Other  potassium chloride (K-DUR) 10 MEQ tablet [id number] No Take 1-2 tablets (10-20 mEq total) by mouth as directed. Alternating every other day. Take 1 tablet (10 mEq) daily and 2 tablets (20 mEq) daily  Patient taking differently: Take 10 mEq by mouth daily.   Minna Merritts, MD Taking Active Spouse/Significant Other  Sennosides (EX-LAX) 15 MG CHEW [id number]  Chew 15 mg by mouth 2 (two) times daily as needed (constipation). [provider]  Active Spouse/Significant Other  sildenafil (REVATIO) 20 MG tablet [id number] No TAKE ONE TABLET BY MOUTH THREE TIMES A DAY  Patient taking differently: Take 20 mg by mouth 3 (three) times daily.   Minna Merritts, MD Taking Active Spouse/Significant Other  tamsulosin (FLOMAX) 0.4 MG CAPS capsule [id number] No TAKE 1 CAPSULE (0.4 MG TOTAL) BY MOUTH AT BEDTIME. [name], MD Taking Active Spouse/Significant Other  torsemide (DEMADEX) 20 MG tablet [id number] No Take 1-2 tablets (20-40 mg total) by mouth as directed. Alternate every other day. 1 tablet (20 mg) daily and 2 tablets (40 mg) daily Minna Merritts, MD Taking Expired 05/23/20 2359           Patient Active Problem List   Diagnosis Date Noted  . Tremor 07/29/2020  . Hypoxia 03/21/2020  . Left leg cellulitis 09/16/2019  . Asthma 03/31/2019  . Renal artery stenosis ([place]) 02/24/2019  . Advance care planning 12/26/2018   . Hypothyroidism 12/26/2018  . HLD (hyperlipidemia) 12/26/2018  . Coronary artery disease involving native coronary artery of native heart with angina pectoris ([place]) 10/09/2018  . Pulmonary hypertension, primary ([place]) 09/26/2018  .  CHF (congestive heart failure) ([place]) 09/20/2018  . Knee pain 03/26/2018  . Encounter for chronic pain management 12/23/2017  . Healthcare maintenance 09/21/2017  . Skin lesion 06/15/2017  . Acute on chronic renal failure ([place]) 01/15/2017  . Coronary stent occlusion: Occluded circumflex stent 01/13/2017  . Accelerated hypertension with diastolic congestive heart failure, NYHA class 3 ([place]); elevated LVEDP on chronic catheterization. 01/13/2017  . NSTEMI (non-ST elevated myocardial infarction) ([place]) 01/11/2017  . Blood in urine 12/08/2016  . Lymphedema 10/23/2016  . Low back pain with right-sided sciatica 04/03/2014  . Muscle spasm 04/24/2013  . Chronic edema 11/13/2012  . Leg edema, right 09/29/2012  . SK (seborrheic keratosis) 09/29/2012  . Chronic diastolic CHF (congestive heart failure) ([place]) 12/15/2011  . Sebaceous cyst 11/28/2011  . Morbid obesity ([place]) 07/27/2011  . Hyperlipidemia with target low density lipoprotein (LDL) cholesterol less than 70 mg/dL 04/26/2011  . Adrenal adenoma 03/02/2011  . Back pain 12/06/2010  . Kidney stones 12/06/2010  . Essential hypertension, benign 10/28/2010  . PULMONARY FIBROSIS 09/15/2010  . CAD (coronary artery disease) 09/01/2010  . Type 2 diabetes mellitus with renal complication ([place]) 25/00/3704  . METABOLIC SYNDROME X 88/89/1694  . SLEEP APNEA, OBSTRUCTIVE 08/31/2010  . Chronic kidney disease, stage III (moderate) ([place]) 08/31/2010    Immunization History  Administered Date(s) Administered  . Fluad Quad(high Dose 65+) 03/27/2019, 03/19/2020  . Influenza Split 04/25/2011, 05/17/2012  . Influenza,inj,Quad PF,6+ Mos 04/22/2013, 04/03/2014, 03/02/2015, 03/02/2016, 03/12/2017, 03/25/2018  . Moderna Sars-Covid-2  Vaccination 09/01/2019, 09/29/2019, 04/25/2020  . Pneumococcal Conjugate-13 10/26/2014  . Pneumococcal Polysaccharide-23 06/01/2009, 09/15/2013    Conditions to be addressed/monitored:  Hypertension, Hyperlipidemia, Diabetes, Heart Failure and Hypothyroidism  Care Plan : [place]  Updates made by Debbora Dus, [facility] since 08/13/2020 12:00 AM    Problem: Hypertension, Hyperlipidemia, Diabetes, Hypothyroidism, and Heart Failure   Priority: High  Onset Date: 08/13/2020  Note:    Current Barriers:  . Not routinely monitoring blood pressure at home.   Pharmacist Clinical Goal(s):  Marland Kitchen Over the next 30 days, patient will maintain control of blood pressure as evidenced by home readings within goal of less than 140/90 mmHg  through collaboration with PharmD and provider.   Interventions: . 1:1 collaboration with [name], MD regarding development and update of comprehensive plan of care as evidenced by provider attestation and co-signature . Inter-disciplinary care team collaboration (see longitudinal plan of care) . Comprehensive medication review performed; medication list updated in electronic medical record  Hypertension (BP goal <140/90 mmHg) -uncontrolled -Current treatment: . Carvedilol 12.5 mg - 1 tablet twice daily with meals . Clonidine 0.3 mg - 1 tablet three times daily -Medications previously tried: amlodipine, hctz/valsartan - note nephrology reports pt was taking 05/2020 but no refill hisotry in past year and pt confirms he is not taking this -Current home readings: doesn't check often but has an arm cuff monitor if needed -Current dietary habits: tries to limit salt intake, does not use table salt  -Denies hypotensive/hypertensive symptoms -Educated on Importance of home blood pressure monitoring; -Counseled to monitor BP at home several days this month, document, and provide log at future appointments -Recommended home BP monitoring for next several  weeks. CMA will call patient to collect log early March.  Hyperlipidemia: (LDL goal < 70) -Unable to assess. Most recent lipids from 12/2018 within goal. Pt reports kidney specialist checks cholesterol at every visit.  -Current treatment: . Atorvastatin 80 mg - 1 tablet daily . Ezetimibe 10 mg -  1 tablet daily -Medications previously tried: none -Educated on Importance of daily adherence -Recommended to continue current medication  Diabetes (A1c goal <7%) -controlled -Current medications: ? Insulin 70/30 (NPH/regular) - Inject 70 units before breakfast and 40-50 units before evening meal (reports taking 70 in AM and 45-50 at night, depending on how high his before supper BG is) -Medications previously tried: none  -Current home glucose readings: checking twice daily, meter not available today but agrees my assistant may call back to get these . Fasting glucose: reports from memory range of 70-130 . Pre-meal: reports from memory range of 125-130 (before supper) --> takes 45 units of NPH unless BG > 150, then will take 50 units NPH, denies any BG > 200  . Denies any BG < 70, but fasting BG is in 70s at times -Denies hypoglycemic symptoms. Confirms hyperglycemic symptoms in last several months - reports some blurry vision on occasion, but discussed unlikely due to high BG as his readings are < 200. -Current meal patterns: "kind of watching what he eats" - reports trying to avoid sweets/desserts, potatoes  . snacks: cookies - store bought, just has these if he feels a low sugar is coming on. Reports this is infrequent.  . drinks: drinks diet sodas and water only -Current exercise: gets outside, yard work  -[employer] and blood sugar goals; Benefits of routine self-monitoring of blood sugar; -Counseled to check feet daily and get yearly eye exams -Recommended CMA to follow up on BG log next month (early March) to ensure he is not having frequent lows.   Hypothyroidism (Goal: TSH within  normal range) -uncontrolled - recent dose adjustment 07/26/20 -Current treatment   Levothyroxine 25 mcg - 1 tablet daily except take 2 on MWF  Recent medication dose increase 07/26/20. Pt confirms increased dosing. TSH scheduled.  Discussed administration. Pt confirms taking before other medications in the morning. -Recommend continuing current medications; Coordinate updating prescription with current dosing.  Heart Failure (Goal: control symptoms and prevent exacerbations) controlled Type: Diastolic -Ejection fraction: 55-60% -Current treatment:  Carvedilol 12.5 mg - 1 tablet twice daily  Torsemide 20 mg - Alternate 20 mg and 40 mg every other day. Take an additional 20 mg tablet for weight gain > 2 lbs overnight or > 5 lbs in 1 week. -Medications previously tried: No longer on potassium or ACE-inhibitor -Current home BP/HR readings: none, not checking -Current dietary habits: tries to limit table salt -Current exercise routine: none formal but tries to go outside and do [employer] -Educated on Importance of weighing daily; if you gain more than 3 pounds in one day or 5 pounds in one week, take an additional torsemide 20 mg Importance of blood pressure control -Recommended to continue current medication; Recommend weighing daily to take torsemide appropriately.  Patient Goals/Self-Care Activities . Over the next 30 days, patient will:  - check blood pressure 3-5 times, document, and provide at future appointments    Medication Assistance: None required.  Patient affirms current coverage meets needs.  Pharmacy/Benefits:[employer]/Mail order - maintenance meds/synced; [facility] - Insulin, Trelegy; [facility] - Sildenafil  Patient's preferred pharmacy is:  [facility] [address] [place] - [address] [place] [address] Phone: [phone] Fax: [phone]  [facility] - [place], [place] - [address] [place] [address] Phone: [phone] Fax: [phone]  [facility] [address] - [place], [place] - [address]  [place] [address] Phone: [phone] Fax: [phone]  Uses pill box? No - takes straight from pill bottle, keeps them in a bread box in a kitchen  Pt endorses 100% compliance, denies any missed doses. We did not review every single medication as patient reports he did this yesterday with nurse and all is up to date. Reviewed refill history and all are very timely. His levothyroxine prescription has not been updated to reflect current dosing.  We discussed: Current pharmacy is preferred with insurance plan and patient is satisfied with pharmacy services Patient decided to: Continue current medication management strategy  Care Plan and Follow Up Patient Decision:  Patient agrees to Care Plan and Follow-up.  Plan: The care management team will reach out to the patient again over the next 30 days. CMA will call for BP and BG log in early March. Patient is expecting call and would prefer afternoons.  Debbora Dus, PharmD Clinical Pharmacist [facility] at [facility] [phone]

## 2020-08-13 NOTE — Telephone Encounter (Signed)
Called Mr. Hausen to review lab results. BMP shows critical low glucose of 31. Need to ensure no hypoglycemic symptoms and that he is eating regular meals. He is on a diabetic regimen at home as well, will need to ask him to check his blood glucose this morning. I have routed the result note to his PCP who manages his diabetic regimen for his awareness as well.   Remainder of lab work shows stable kidney function, normal potassium, CBC with no evidence of anemia nor infection.   No answer and voicemail was not set up. Will continue attempts to reach Mr. Buchheit.   Loel Dubonnet, NP

## 2020-08-13 NOTE — Telephone Encounter (Signed)
Thank you!  I also have our office manager following up on why the critical lab was not called to our office just as an [place].   Loel Dubonnet, NP

## 2020-08-13 NOTE — Patient Instructions (Addendum)
Dear Stephen [name],  Below is a summary of the goals we discussed during our follow up appointment on August 13, 2020. Please contact me anytime with questions or concerns.   Visit Information  Goals Addressed            This Visit's Progress   . Track and Manage My Blood Pressure-Hypertension       Timeframe:  Short-Term Goal Priority:  High Start Date:    08/13/20                         Expected End Date:  09/10/20              Follow Up Date: 1st week of March 2022   - check blood pressure weekly - write blood pressure results in a log or diary    Why is this important?    You won't feel high blood pressure, but it can still hurt your blood vessels.   High blood pressure can cause heart or kidney problems. It can also cause a stroke.   Making lifestyle changes like losing a little weight or eating less salt will help.   Checking your blood pressure at home and at different times of the day can help to control blood pressure.   If the doctor prescribes medicine remember to take it the way the doctor ordered.   Call the office if you cannot afford the medicine or if there are questions about it.           Patient Care Plan: CCM Pharmacy Care Plan    Problem Identified: Hypertension, Hyperlipidemia, Diabetes, Hypothyroidism, and Heart Failure   Priority: High  Onset Date: 08/13/2020  Note:    Current Barriers:  . Not routinely monitoring blood pressure at home.   Pharmacist Clinical Goal(s):  Marland Kitchen Over the next 30 days, patient will maintain control of blood pressure as evidenced by home readings within goal of less than 140/90 mmHg  through collaboration with PharmD and provider.   Interventions: . 1:1 collaboration with [name], MD regarding development and update of comprehensive plan of care as evidenced by provider attestation and co-signature . Inter-disciplinary care team collaboration (see longitudinal plan of care) . Comprehensive medication  review performed; medication list updated in electronic medical record  Hypertension (BP goal <140/90 mmHg) -uncontrolled -Current treatment: . Carvedilol 12.5 mg - 1 tablet twice daily with meals . Clonidine 0.3 mg - 1 tablet three times daily -Medications previously tried: amlodipine, hctz/valsartan - note nephrology reports pt was taking 05/2020 but no refill hisotry in past year and pt confirms he is not taking this -Current home readings: doesn't check often but has an arm cuff monitor if needed -Current dietary habits: tries to limit salt intake, does not use table salt  -Denies hypotensive/hypertensive symptoms -Educated on Importance of home blood pressure monitoring; -Counseled to monitor BP at home several days this month, document, and provide log at future appointments -Recommended home BP monitoring for next several weeks. CMA will call patient to collect log early March.  Hyperlipidemia: (LDL goal < 70) -Unable to assess. Most recent lipids from 12/2018 within goal. Pt reports kidney specialist checks cholesterol at every visit.  -Current treatment: . Atorvastatin 80 mg - 1 tablet daily . Ezetimibe 10 mg - 1 tablet daily -Medications previously tried: none -Educated on Importance of daily adherence -Recommended to continue current medication  Diabetes (A1c goal <7%) -controlled -Current medications: ? Insulin  70/30 (NPH/regular) - Inject 70 units before breakfast and 40-50 units before evening meal (reports taking 70 in AM and 45-50 at night, depending on how high his before supper BG is) -Medications previously tried: none  -Current home glucose readings: checking twice daily, meter not available today but agrees my assistant may call back to get these . Fasting glucose: reports from memory range of 70-130 . Pre-meal: reports from memory range of 125-130 (before supper) --> takes 45 units of NPH unless BG > 150, then will take 50 units NPH, denies any BG > 200  . Denies  any BG < 70, but fasting BG is in 70s at times -Denies hypoglycemic symptoms. Confirms hyperglycemic symptoms in last several months - reports some blurry vision on occasion, but discussed unlikely due to high BG as his readings are < 200. -Current meal patterns: "kind of watching what he eats" - reports trying to avoid sweets/desserts, potatoes  . snacks: cookies - store bought, just has these if he feels a low sugar is coming on. Reports this is infrequent.  . drinks: drinks diet sodas and water only -Current exercise: gets outside, yard work  -[employer] and blood sugar goals; Benefits of routine self-monitoring of blood sugar; -Counseled to check feet daily and get yearly eye exams -Recommended CMA to follow up on BG log next month (early March) to ensure he is not having frequent lows.   Hypothyroidism (Goal: TSH within normal range) -uncontrolled - recent dose adjustment 07/26/20 -Current treatment   Levothyroxine 25 mcg - 1 tablet daily except take 2 on MWF  Recent medication dose increase 07/26/20. Pt confirms increased dosing. TSH scheduled.  Discussed administration. Pt confirms taking before other medications in the morning. -Recommend continuing current medications; Coordinate updating prescription with current dosing.  Heart Failure (Goal: control symptoms and prevent exacerbations) controlled Type: Diastolic -Ejection fraction: 55-60% -Current treatment:  Carvedilol 12.5 mg - 1 tablet twice daily  Torsemide 20 mg - Alternate 20 mg and 40 mg every other day. Take an additional 20 mg tablet for weight gain > 2 lbs overnight or > 5 lbs in 1 week. -Medications previously tried: No longer on potassium or ACE-inhibitor -Current home BP/HR readings: none, not checking -Current dietary habits: tries to limit table salt -Current exercise routine: none formal but tries to go outside and do [employer] -Educated on Importance of weighing daily; if you gain more than 3  pounds in one day or 5 pounds in one week, take an additional torsemide 20 mg Importance of blood pressure control -Recommended to continue current medication; Recommend weighing daily to take torsemide appropriately.  Patient Goals/Self-Care Activities . Over the next 30 days, patient will:  - check blood pressure 3-5 times, document, and provide at future appointments     The patient verbalized understanding of instructions, educational materials, and care plan provided today and declined offer to receive copy of patient instructions, educational materials, and care plan.   The pharmacy team will reach out to the patient again over the next 30 days.  Follow up with Sharyn Lull - February 11, 2021 at 11 AM phone call.   Debbora Dus, PharmD Clinical Pharmacist [facility] [phone]   Blood Pressure Record Sheet To take your blood pressure, you will need a blood pressure machine. You can buy a blood pressure machine (blood pressure monitor) at your clinic, drug store, or online. When choosing one, consider:  An automatic monitor that has an arm cuff.  A cuff that wraps snugly around your upper arm. You should be able to fit only one finger between your arm and the cuff.  A device that stores blood pressure reading results.  Do not choose a monitor that measures your blood pressure from your wrist or finger. Follow your health care provider's instructions for how to take your blood pressure. To use this form:  Get one reading in the morning (a.m.) before you take any medicines.  Get one reading in the evening (p.m.) before supper.  Take at least 2 readings with each blood pressure check. This makes sure the results are correct. Wait 1-2 minutes between measurements.  Write down the results in the spaces on this form.  Repeat this once a week, or as told by your health care provider.  Make a follow-up appointment with your health care provider to  discuss the results. Blood pressure log Date: _______________________  a.m. _____________________(1st reading) _____________________(2nd reading)  p.m. _____________________(1st reading) _____________________(2nd reading) Date: _______________________  a.m. _____________________(1st reading) _____________________(2nd reading)  p.m. _____________________(1st reading) _____________________(2nd reading) Date: _______________________  a.m. _____________________(1st reading) _____________________(2nd reading)  p.m. _____________________(1st reading) _____________________(2nd reading) Date: _______________________  a.m. _____________________(1st reading) _____________________(2nd reading)  p.m. _____________________(1st reading) _____________________(2nd reading) Date: _______________________  a.m. _____________________(1st reading) _____________________(2nd reading)  p.m. _____________________(1st reading) _____________________(2nd reading) This information is not intended to replace advice given to you by your health care provider. Make sure you discuss any questions you have with your health care provider. Document Revised: 10/08/2019 Document Reviewed: 10/08/2019 Elsevier Patient Education  2021 [employer].

## 2020-08-15 NOTE — Telephone Encounter (Signed)
We addressed the lab results.  Thanks.

## 2020-08-17 ENCOUNTER — Other Ambulatory Visit: Payer: Self-pay

## 2020-08-17 ENCOUNTER — Other Ambulatory Visit
Admission: RE | Admit: 2020-08-17 | Discharge: 2020-08-17 | Disposition: A | Discharge status: Home / Self Care | Payer: Medicare HMO | Source: Ambulatory Visit | Attending: Cardiovascular Disease | Admitting: Cardiovascular Disease

## 2020-08-17 DIAGNOSIS — Z20822 Contact with and (suspected) exposure to covid-19: Secondary | ICD-10-CM | POA: Insufficient documentation

## 2020-08-17 LAB — SARS CORONAVIRUS 2 (TAT 6-24 HRS): SARS Coronavirus 2: NEGATIVE

## 2020-08-19 ENCOUNTER — Ambulatory Visit
Admission: RE | Admit: 2020-08-19 | Discharge: 2020-08-19 | Disposition: A | Discharge status: Home / Self Care | Payer: Medicare HMO | Attending: Cardiovascular Disease | Admitting: Cardiovascular Disease

## 2020-08-19 ENCOUNTER — Other Ambulatory Visit: Payer: Self-pay

## 2020-08-19 ENCOUNTER — Encounter
Admission: RE | Disposition: A | Discharge status: Home / Self Care | Payer: Self-pay | Source: Home / Self Care | Attending: Cardiovascular Disease

## 2020-08-19 ENCOUNTER — Other Ambulatory Visit: Payer: Self-pay | Admitting: *Deleted

## 2020-08-19 ENCOUNTER — Encounter: Payer: Self-pay | Admitting: Cardiovascular Disease

## 2020-08-19 DIAGNOSIS — J8410 Pulmonary fibrosis, unspecified: Secondary | ICD-10-CM | POA: Diagnosis not present

## 2020-08-19 DIAGNOSIS — I5020 Unspecified systolic (congestive) heart failure: Secondary | ICD-10-CM

## 2020-08-19 DIAGNOSIS — I130 Hypertensive heart and chronic kidney disease with heart failure and stage 1 through stage 4 chronic kidney disease, or unspecified chronic kidney disease: Secondary | ICD-10-CM | POA: Diagnosis not present

## 2020-08-19 DIAGNOSIS — I110 Hypertensive heart disease with heart failure: Secondary | ICD-10-CM | POA: Diagnosis present

## 2020-08-19 DIAGNOSIS — I272 Pulmonary hypertension, unspecified: Secondary | ICD-10-CM | POA: Diagnosis not present

## 2020-08-19 DIAGNOSIS — I25110 Atherosclerotic heart disease of native coronary artery with unstable angina pectoris: Secondary | ICD-10-CM | POA: Diagnosis not present

## 2020-08-19 DIAGNOSIS — E1122 Type 2 diabetes mellitus with diabetic chronic kidney disease: Secondary | ICD-10-CM | POA: Diagnosis not present

## 2020-08-19 DIAGNOSIS — Z7902 Long term (current) use of antithrombotics/antiplatelets: Secondary | ICD-10-CM | POA: Diagnosis not present

## 2020-08-19 DIAGNOSIS — I200 Unstable angina: Secondary | ICD-10-CM

## 2020-08-19 DIAGNOSIS — Z794 Long term (current) use of insulin: Secondary | ICD-10-CM | POA: Diagnosis not present

## 2020-08-19 DIAGNOSIS — I5033 Acute on chronic diastolic (congestive) heart failure: Secondary | ICD-10-CM

## 2020-08-19 DIAGNOSIS — Z79899 Other long term (current) drug therapy: Secondary | ICD-10-CM | POA: Insufficient documentation

## 2020-08-19 DIAGNOSIS — Z79890 Hormone replacement therapy (postmenopausal): Secondary | ICD-10-CM | POA: Insufficient documentation

## 2020-08-19 DIAGNOSIS — R0609 Other forms of dyspnea: Secondary | ICD-10-CM

## 2020-08-19 DIAGNOSIS — E039 Hypothyroidism, unspecified: Secondary | ICD-10-CM | POA: Diagnosis not present

## 2020-08-19 DIAGNOSIS — I5042 Chronic combined systolic (congestive) and diastolic (congestive) heart failure: Secondary | ICD-10-CM | POA: Diagnosis not present

## 2020-08-19 DIAGNOSIS — Z6841 Body Mass Index (BMI) 40.0 and over, adult: Secondary | ICD-10-CM | POA: Insufficient documentation

## 2020-08-19 DIAGNOSIS — I5032 Chronic diastolic (congestive) heart failure: Secondary | ICD-10-CM

## 2020-08-19 DIAGNOSIS — Z9981 Dependence on supplemental oxygen: Secondary | ICD-10-CM | POA: Insufficient documentation

## 2020-08-19 DIAGNOSIS — I270 Primary pulmonary hypertension: Secondary | ICD-10-CM

## 2020-08-19 DIAGNOSIS — I2510 Atherosclerotic heart disease of native coronary artery without angina pectoris: Secondary | ICD-10-CM

## 2020-08-19 DIAGNOSIS — N183 Chronic kidney disease, stage 3 unspecified: Secondary | ICD-10-CM | POA: Insufficient documentation

## 2020-08-19 DIAGNOSIS — Z7982 Long term (current) use of aspirin: Secondary | ICD-10-CM | POA: Insufficient documentation

## 2020-08-19 DIAGNOSIS — R0600 Dyspnea, unspecified: Secondary | ICD-10-CM

## 2020-08-19 DIAGNOSIS — E1129 Type 2 diabetes mellitus with other diabetic kidney complication: Secondary | ICD-10-CM | POA: Diagnosis present

## 2020-08-19 DIAGNOSIS — I25118 Atherosclerotic heart disease of native coronary artery with other forms of angina pectoris: Secondary | ICD-10-CM

## 2020-08-19 HISTORY — PX: CORONARY ANGIOGRAPHY: CATH118303

## 2020-08-19 LAB — GLUCOSE, CAPILLARY
Glucose-Capillary: 62 mg/dL — ABNORMAL LOW (ref 70–99)
Glucose-Capillary: 100 mg/dL — ABNORMAL HIGH (ref 70–99)
Glucose-Capillary: 79 mg/dL (ref 70–99)

## 2020-08-19 SURGERY — CORONARY ANGIOGRAPHY (CATH LAB)
Anesthesia: Moderate Sedation

## 2020-08-19 MED ORDER — MIDAZOLAM HCL 2 MG/2ML IJ SOLN
INTRAMUSCULAR | Status: AC
  Filled 2020-08-19: qty 2

## 2020-08-19 MED ORDER — HEPARIN (PORCINE) IN NACL 1000-0.9 UT/500ML-% IV SOLN
INTRAVENOUS | Status: DC | PRN
  Administered 2020-08-19: 500 mL

## 2020-08-19 MED ORDER — HYDRALAZINE HCL 20 MG/ML IJ SOLN
INTRAMUSCULAR | Status: AC
  Filled 2020-08-19: qty 1

## 2020-08-19 MED ORDER — MIDAZOLAM HCL 2 MG/2ML IJ SOLN
INTRAMUSCULAR | Status: DC | PRN
  Administered 2020-08-19 (×2): 1 mg via INTRAVENOUS

## 2020-08-19 MED ORDER — FENTANYL CITRATE (PF) 100 MCG/2ML IJ SOLN
INTRAMUSCULAR | Status: DC | PRN
  Administered 2020-08-19 (×2): 25 ug via INTRAVENOUS

## 2020-08-19 MED ORDER — SODIUM CHLORIDE 0.9 % IV SOLN: 250.0000 mL | INTRAVENOUS | Status: DC | PRN

## 2020-08-19 MED ORDER — SODIUM CHLORIDE 0.9% FLUSH: 3.0000 mL | INTRAVENOUS | Status: DC | PRN

## 2020-08-19 MED ORDER — LABETALOL HCL 5 MG/ML IV SOLN: 10.0000 mg | INTRAVENOUS | Status: DC | PRN

## 2020-08-19 MED ORDER — HEPARIN (PORCINE) IN NACL 1000-0.9 UT/500ML-% IV SOLN
INTRAVENOUS | Status: AC
  Filled 2020-08-19: qty 1000

## 2020-08-19 MED ORDER — SODIUM CHLORIDE 0.9% FLUSH
3.0000 mL | Freq: Two times a day (BID) | INTRAVENOUS | Status: DC
  Administered 2020-08-19: 3 mL via INTRAVENOUS

## 2020-08-19 MED ORDER — HYDRALAZINE HCL 20 MG/ML IJ SOLN
INTRAMUSCULAR | Status: DC | PRN
  Administered 2020-08-19: 10 mg via INTRAVENOUS

## 2020-08-19 MED ORDER — LIDOCAINE HCL (PF) 1 % IJ SOLN
INTRAMUSCULAR | Status: DC | PRN
  Administered 2020-08-19: 15 mL

## 2020-08-19 MED ORDER — IOHEXOL 300 MG/ML  SOLN
INTRAMUSCULAR | Status: DC | PRN
  Administered 2020-08-19: 95 mL

## 2020-08-19 MED ORDER — HYDRALAZINE HCL 20 MG/ML IJ SOLN: 10.0000 mg | INTRAMUSCULAR | Status: DC | PRN

## 2020-08-19 MED ORDER — ASPIRIN 81 MG PO CHEW: 81.0000 mg | CHEWABLE_TABLET | ORAL | Status: DC

## 2020-08-19 MED ORDER — LIDOCAINE HCL (PF) 1 % IJ SOLN
INTRAMUSCULAR | Status: AC
  Filled 2020-08-19: qty 30

## 2020-08-19 MED ORDER — SODIUM CHLORIDE 0.9 % IV SOLN
INTRAVENOUS | Status: DC
  Administered 2020-08-19: 250 mL via INTRAVENOUS

## 2020-08-19 MED ORDER — DEXTROSE 50 % IV SOLN
12.5000 g | INTRAVENOUS | Status: AC
  Administered 2020-08-19: 12.5 g via INTRAVENOUS

## 2020-08-19 MED ORDER — ACETAMINOPHEN 325 MG PO TABS: 650.0000 mg | ORAL_TABLET | ORAL | Status: DC | PRN

## 2020-08-19 MED ORDER — SODIUM CHLORIDE 0.9% FLUSH: 3.0000 mL | Freq: Two times a day (BID) | INTRAVENOUS | Status: DC

## 2020-08-19 MED ORDER — FENTANYL CITRATE (PF) 100 MCG/2ML IJ SOLN
INTRAMUSCULAR | Status: AC
  Filled 2020-08-19: qty 2

## 2020-08-19 MED ORDER — SODIUM CHLORIDE 0.9 % WEIGHT BASED INFUSION: 1.0000 mL/kg/h | INTRAVENOUS | Status: DC

## 2020-08-19 MED ORDER — DEXTROSE 50 % IV SOLN
INTRAVENOUS | Status: AC
  Filled 2020-08-19: qty 50

## 2020-08-19 MED ORDER — ONDANSETRON HCL 4 MG/2ML IJ SOLN: 4.0000 mg | Freq: Four times a day (QID) | INTRAMUSCULAR | Status: DC | PRN

## 2020-08-19 SURGICAL SUPPLY — 12 items
CATH INFINITI 5FR ANG PIGTAIL (CATHETERS) ×2 IMPLANT
CATH INFINITI 5FR JL4 (CATHETERS) ×2 IMPLANT
CATH INFINITI JR4 5F (CATHETERS) ×2 IMPLANT
CATH SWAN GANZ 7F STRAIGHT (CATHETERS) IMPLANT
DEVICE CLOSURE MYNXGRIP 5F (Vascular Products) ×2 IMPLANT
KIT MANI 3VAL PERCEP (MISCELLANEOUS) ×2 IMPLANT
NEEDLE PERC 18GX7CM (NEEDLE) ×2 IMPLANT
PACK CARDIAC CATH (CUSTOM PROCEDURE TRAY) ×2 IMPLANT
SHEATH AVANTI 5FR X 11CM (SHEATH) ×2 IMPLANT
SHEATH AVANTI 7FRX11 (SHEATH) IMPLANT
SHEATH BRITE TIP 4FRX11 (SHEATH) ×2 IMPLANT
WIRE GUIDERIGHT .035X150 (WIRE) ×4 IMPLANT

## 2020-08-21 NOTE — Interval H&P Note (Signed)
History and Physical Interval Note:  08/21/2020 8:03 PM  Stephen [name]  has presented today for surgery, with the diagnosis of LT Heart Cath   HF with reduced EF  Dyspnea on exertion  Hx of CAD, unstable angina, hypoxia.  The various methods of treatment have been discussed with the patient and family. After consideration of risks, benefits and other options for treatment, the patient has consented to  Procedure(s): CORONARY ANGIOGRAPHY (N/A) as a surgical intervention.  The patient's history has been reviewed, patient examined, no change in status, stable for surgery.  I have reviewed the patient's chart and labs.  Questions were answered to the patient's satisfaction.     Ida Rogue

## 2020-08-21 NOTE — H&P (Signed)
H&P Addendum, precardiac catheterization  Patient was seen and evaluated prior to Cardiac catheterization procedure Symptoms, prior testing details again confirmed with the patient Patient examined, no significant change from prior exam Lab work reviewed in detail personally by myself Patient understands risk and benefit of the procedure, willing to proceed  Signed, Tim Lendy Dittrich, MD, Ph.D [facility]    

## 2020-08-23 ENCOUNTER — Other Ambulatory Visit: Payer: Self-pay | Admitting: Family Medicine

## 2020-08-25 ENCOUNTER — Other Ambulatory Visit: Payer: Self-pay | Admitting: Cardiovascular Disease

## 2020-08-27 DIAGNOSIS — J449 Chronic obstructive pulmonary disease, unspecified: Secondary | ICD-10-CM | POA: Diagnosis not present

## 2020-09-03 ENCOUNTER — Other Ambulatory Visit: Payer: Self-pay | Admitting: Family Medicine

## 2020-09-03 DIAGNOSIS — Z794 Long term (current) use of insulin: Secondary | ICD-10-CM

## 2020-09-03 DIAGNOSIS — E039 Hypothyroidism, unspecified: Secondary | ICD-10-CM

## 2020-09-03 DIAGNOSIS — E1121 Type 2 diabetes mellitus with diabetic nephropathy: Secondary | ICD-10-CM

## 2020-09-05 NOTE — Progress Notes (Signed)
Date:  09/06/2020   ID:  [name], DOB Aug 03, 1945, MRN [id number]  Patient Location:  [place] [address]   Provider location:   [facility], [place] office  PCP:  [name], MD  Cardiologist:  Arvid Right [facility]  Chief Complaint  Patient presents with   Other    2 wk f/u post cath. Meds reviewed verbally with pt.     History of Present Illness:    ADILSON GRAFTON is a 75 y.o. 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 Status post stent to his left renal artery for 80+ percent stenosis several weeks ago, performed by Dr. Lucky Cowboy. laser treatment of the lower extremity veins for leg swelling/venous insufficiency who presents for routine followup of his coronary artery disease and shortness of breath  LOV 03/2020 Seen by one of our providers 08/2020  Cardiac cath : 08/19/2020 BP markedly elevated Challenging access  given morbid obesity, weight 270 pounds Severe left circumflex disease occluded mid vessel, known from prior catheterization, collaterals Moderate to severe mid to distal RCA disease, unchanged from prior catheterization Moderate proximal and mid LAD disease Difficult groin access given morbid obesity, arterial accessed easily, Ultrasound used to visualize the vein, not well visualized apart from distal vessel but was too proximal to the artery to engage  Recommendation for aggressive BP and weight control Suggestion for cardiac rehab  A1C was 8, Now down to 6.9  Weight trending down slowly  Torsemide 40/20 mg alternating  CR 2.15 in 06/2019 CR 2.5 in 01/26/2020 stable  Chronic SOB 3-4  liters, 4 liters at home at night Followed by pulmonary, on Breo  EKG personally reviewed by myself on todays visit NSR rate 57 bpm, old anterior MI  Other past medical echocardiogram March 2020 ejection fraction 45 to 50% moderately elevated right heart pressures estimated at 50 mmHg  hospitalization March 2020 with acute respiratory distress O2 sat down to 80% on room air with oxygen walking around his house,  Syncope, fell through a wall  upper respiratory infection  short course of prednisone, made his sugars run high   Myoview in July 2019 showed region of old infarct, possible region of ischemia though significant attenuation artifact No catheterization at the time as he was relatively asymptomatic, had renal dysfunction  Stress test ordered for shortness of breath Mages pulled up and discussed with him  defect lateral wall Known occluded left circumflex vessel Ejection fraction 30-45%  Cath 01/11/2017, occluded LCX, with collaterals Distal RCA stenosis in-stent 70%, medical management at this time  Echocardiogram ejection fraction 50-55%  Stress test July 2011 was dobutamine stress that showed no significant ischemia, inadequate heart rate was achieved. right and left heart catheterization January 2011 showed 50% mid LAD disease, 60% at the ostium of the diagonal #2, 30% proximal left circumflex disease, patent stent in the left circumflex, patent stent of the mid RCA, wedge pressure of 16, PA pressure mean 22, right ventricular pressure 41/8 .  echocardiogram September 2010 shows normal systolic function, diastolic dysfunction, normal RV function unable to evaluate right ventricular systolic pressures   Past Medical History:  Diagnosis Date   Adrenal  gland anomaly    Arthritis    CAD (coronary artery disease)    a. 04/2006 MI and PCI/stenting to mLCx & mRCA; b. 07/2009 Cath: patent LCX/RCA stents;  c. 12/2016 NSTEMI/Cath: LM 30, LAD 30p, 13m 30d, D1/2/3 min  irregs, LCX 1069mSR, OM1 min irregs, RCA 20p, 10 ISR, 7043m0d, RPDA min irregs, RPAV 40-->Med Rx; d. 12/2017 MV: fixed lateral wall scar, mild anterior/anterior septal ischemia. EF 30-44%.   CHF (congestive heart failure) (HCC)    CKD (chronic kidney disease), stage III (HCC)    Colon polyps    DM2 (diabetes mellitus, type 2) (HCC)    insulin requiring   Dyslipidemia    GERD (gastroesophageal reflux disease)    History of echocardiogram    a. TTE 12/2016: EF 50-55%, mod concentric LVH, images inadequate for wall motion assessment, not technically sufficienct to allow for LV dias fxn, calcified mitral annulus    HTN (hypertension)    Kidney stones    Left Renal artery stenosis ([place]  a. 07/2013 s/p PTA/stenting ([place]); b. 07/2017 Renal Duplex: No significant RAS.   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   Recurrent UTI    Rotator cuff disorder    right   Skin cancer    head   Thyroid disorder    Venous insufficiency of both lower extremities    a. s/p laser treatment.   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   CARPAL TUNNEL RELEASE     bilateral   CATARACT EXTRACTION W/PHACO Right 07/08/2018   Procedure: CATARACT EXTRACTION PHACO AND INTRAOCULAR LENS PLACEMENT ([place];  Surgeon: KinEulogio BearD;  [facility];  Laterality: Right;  Diabetic - insulin sleep apnea   CATARACT EXTRACTION W/PHACO Left 08/05/2018   Procedure: CATARACT EXTRACTION PHACO AND INTRAOCULAR LENS PLACEMENT ([place] DIABETIC;  Surgeon: KinEulogio BearD;  [facility];  Laterality: Left;  DIABETIC   CORONARY ANGIOGRAPHY N/A 08/19/2020   Procedure: CORONARY ANGIOGRAPHY;  Surgeon: GolMinna MerrittsD;  [facility];  Service: Cardiovascular;  Laterality: N/A;   CORONARY ANGIOPLASTY WITH STENT PLACEMENT  2007   x 2   KNEE SURGERY     right   LEFT HEART CATH AND CORONARY ANGIOGRAPHY N/A 01/11/2017   Procedure: Left Heart Cath and Coronary Angiography;  Surgeon: AriWellington HampshireD;  [facility];  Service: Cardiovascular;  Laterality: N/A;   LUNG BIOPSY     RENAL ARTERY STENT  2015   L   SKIN CANCER EXCISION     back of head     Current Meds  Medication Sig   ACCU-CHEK SOFTCLIX LANCETS lancets Use as instructed to check blood sugar three times daily and as needed.  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   albuterol (VENTOLIN HFA) 108 (90 Base) MCG/ACT inhaler Inhale 2 puffs into the lungs every 6 (six) hours as needed for wheezing or shortness of breath. Dispense 3 inhalers.   Alcohol Swabs (B-D SINGLE USE SWABS REGULAR) PADS Use to cleans area prior to checking blood sugar three times daily and as needed.  Diagnosis:  E11.22  Insulin dependent.   aspirin EC 81 MG tablet Take 324 mg by  mouth daily. Swallow whole.   atorvastatin (LIPITOR) 80 MG tablet TAKE 1 TABLET AT BEDTIME   Blood Glucose Monitoring Suppl (ACCU-CHEK AVIVA PLUS) w/Device KIT Use to check blood sugar three times daily and as needed.  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   dorzolamide-timolol (COSOPT) 22.3-6.8 MG/ML ophthalmic solution Place 1 drop into both eyes in the morning and at bedtime.   ezetimibe (ZETIA) 10 MG tablet TAKE 1 TABLET EVERY DAY   Fluticasone-Umeclidin-Vilant (TRELEGY ELLIPTA) 200-62.5-25 MCG/INH AEPB Inhale 1 puff into the lungs  daily.   gabapentin (NEURONTIN) 400 MG capsule Take 2 tabs in the AM and 3 at night.   glucose blood (ACCU-CHEK AVIVA PLUS) test strip USE AS INSTRUCTED TO CHECK BLOOD SUGAR 3 TIMES DAILY OR AS NEEDED DX. E11.9   HYDROcodone-acetaminophen (NORCO/VICODIN) 5-325 MG tablet Take 1 tablet by mouth every 6 (six) hours as needed for moderate pain.   insulin NPH-regular Human (NOVOLIN 70/30) (70-30) 100 UNIT/ML injection Inject 40-60 Units into the skin See admin instructions. Inject 60 units subcutaneously in the morning  & inject 40 units subcutaneously at bedtime.   Insulin Syringe-Needle U-100 (INSULIN SYRINGE 1CC/30GX5/16") 30G X 5/16" 1 ML MISC Use as directed to take insulin Dx E11.9   isosorbide mononitrate (IMDUR) 60 MG 24 hr tablet TAKE 1 AND 1/2 TABLETS TWICE DAILY   levothyroxine (SYNTHROID) 25 MCG tablet 1 tab a day except take 2 tablets on Monday Wednesday Friday. Total of 10 pills a week.   nitroGLYCERIN (NITROSTAT) 0.4 MG SL tablet Place 1 tablet (0.4 mg total) under the tongue every 5 (five) minutes as needed. May repeat x3   omeprazole (PRILOSEC) 20 MG capsule Take 20 mg by mouth daily before breakfast.   potassium chloride (K-DUR) 10 MEQ tablet Take 1-2 tablets (10-20 mEq total) by mouth as directed. Alternating every other day. Take 1 tablet (10 mEq) daily and 2 tablets (20 mEq) daily   Sennosides (EX-LAX) 15 MG CHEW Chew 15 mg by mouth 2 (two) times daily as needed (constipation).   sildenafil (REVATIO) 20 MG tablet TAKE ONE TABLET BY MOUTH THREE TIMES A DAY   tamsulosin (FLOMAX) 0.4 MG CAPS capsule TAKE 1 CAPSULE AT BEDTIME.   torsemide (DEMADEX) 20 MG tablet Take 1-2 tablets (20-40 mg total) by mouth as directed. Alternate every other day. 1 tablet (20 mg) daily and 2 tablets (40 mg) daily     Allergies:   Calcium channel blockers   Social History   Tobacco Use   Smoking status: Never Smoker   Smokeless tobacco: Never Used  Vaping Use   Vaping Use: Never used   Substance Use Topics   Alcohol use: No    Alcohol/week: 0.0 standard drinks   Drug use: No     Family Hx: The patient's family history includes Arthritis in his maternal grandmother; Breast cancer in his maternal aunt; COPD in his sister; Diabetes in his maternal aunt; Heart attack in his brother, father, and mother; Hypertension in his maternal aunt and another family member. There is no history of Prostate cancer, Kidney cancer, Bladder Cancer, or Colon cancer.  ROS:   Please see the history of present illness.  Review of Systems  Constitutional: Negative.   HENT: Negative.   Respiratory: Positive for shortness of breath.   Cardiovascular: Positive for leg swelling.  Gastrointestinal: Negative.   Musculoskeletal: Negative.   Neurological: Negative.   Psychiatric/Behavioral: Negative.   All other systems reviewed and are negative.    Labs/Other Tests and Data Reviewed:    Recent Labs: 07/12/2020: TSH 7.42 08/12/2020: BUN 28; Creatinine, Ser 2.55; Hemoglobin 13.5; Platelets 195; Potassium 4.1; Sodium 148   Recent Lipid Panel Lab Results  Component Value Date/Time   CHOL 125 12/23/2018 09:17 AM   TRIG 142.0 12/23/2018 09:17 AM   HDL 27.70 (L) 12/23/2018 09:17 AM   CHOLHDL 5 12/23/2018 09:17 AM   LDLCALC 69 12/23/2018 09:17 AM    Wt Readings from Last 3 Encounters:  09/06/20 268 lb (121.6 kg)  08/19/20 266 lb 12.1 oz (121 kg)  08/12/20 267 lb (121.1 kg)     Exam:    Vital Signs: Vital signs may also be detailed in the HPI BP (!) 160/70 (BP Location: Left Arm, Patient Position: Sitting, Cuff Size: Large)    Pulse 60    Ht _0  (1.651 m)    Wt 268 lb (121.6 kg)    SpO2 98%    BMI 44.60 kg/m   Constitutional:  oriented to person, place, and time. No distress.  HENT:  Head: Grossly normal Eyes:  no discharge. No scleral icterus.  Neck: No JVD, no carotid bruits  Cardiovascular: Regular rate and rhythm, no murmurs appreciated 1+ lower extremity edema, trace  pitting Pulmonary/Chest: Clear to auscultation bilaterally, no wheezes or rails Abdominal: Soft.  no distension.  no tenderness.  Musculoskeletal: Normal range of motion Neurological:  normal muscle tone. Coordination normal. No atrophy Skin: Skin warm and dry Psychiatric: normal affect, pleasant   ASSESSMENT & PLAN:    Pulmonary hypertension, primary ([place]) Secondary to interstitial lung disease Chronic shortness of breath, on Breo, Followed by pulmonary Moderately elevated pressures on echo, Unable to access femoral vein on catheterization Continue current torsemide regimen 40 alternating with 20, at the upper limits given renal dysfunction creatinine 2.5  Morbid obesity (HCC) Slowly moderating his diet, weight with minimal change down  Essential hypertension Very high blood pressure during cardiac catheterization, elevated again today Discussed how this can affect his breathing Recommend he add hydralazine 25 up to 50 mg 3 times daily  Chronic diastolic congestive heart failure (HCC) Creatinine 2.5, highest end of his range Followed by nephrology No changes to his medications, will work on blood pressure  Coronary artery disease involving native coronary artery of native heart with angina pectoris (HCC) Recent cardiac catheterization, stable disease A1c at goal, cholesterol at goal  PULMONARY FIBROSIS Followed by pulmonary On oxygen 3 to 4 L Reports having episodes of hypoxia at home when walking without oxygen    Total encounter time more than 25 minutes  Greater than 50% was spent in counseling and coordination of care with the patient     Signed, Ida Rogue, MD  09/06/2020 2:12 PM    [place] [id number], [place], [place] [address]

## 2020-09-06 ENCOUNTER — Encounter: Payer: Self-pay | Admitting: Cardiovascular Disease

## 2020-09-06 ENCOUNTER — Other Ambulatory Visit: Payer: Self-pay

## 2020-09-06 ENCOUNTER — Ambulatory Visit: Payer: Medicare HMO | Admitting: Cardiovascular Disease

## 2020-09-06 VITALS — BP 160/70 | HR 60 | Ht 65.0 in | Wt 268.0 lb

## 2020-09-06 DIAGNOSIS — I701 Atherosclerosis of renal artery: Secondary | ICD-10-CM | POA: Diagnosis not present

## 2020-09-06 DIAGNOSIS — I25118 Atherosclerotic heart disease of native coronary artery with other forms of angina pectoris: Secondary | ICD-10-CM

## 2020-09-06 DIAGNOSIS — I10 Essential (primary) hypertension: Secondary | ICD-10-CM | POA: Diagnosis not present

## 2020-09-06 DIAGNOSIS — I270 Primary pulmonary hypertension: Secondary | ICD-10-CM | POA: Diagnosis not present

## 2020-09-06 DIAGNOSIS — I5020 Unspecified systolic (congestive) heart failure: Secondary | ICD-10-CM | POA: Diagnosis not present

## 2020-09-06 MED ORDER — HYDRALAZINE HCL 50 MG PO TABS: 50.0000 mg | ORAL_TABLET | Freq: Three times a day (TID) | ORAL | 6 refills | Status: DC

## 2020-09-06 NOTE — Telephone Encounter (Signed)
Pharmacy requests refill on: Levothyroxine 25 mcg   LAST REFILL: 07/26/2020 LAST OV: 03/19/2020 NEXT OV: 11/25/2020 PHARMACY: [id number] [place], [place] TSH (07/12/2020): 7.42

## 2020-09-06 NOTE — Patient Instructions (Signed)
Medication Instructions:   Please start hydralazine 50 mg three times a day  (ok to start a 1/2 pill three times a day for the first week)   If you need a refill on your cardiac medications before your next appointment, please call your pharmacy.    Lab work: No new labs needed   If you have labs (blood work) drawn today and your tests are completely normal, you will receive your results only by: Marland Kitchen MyChart Message (if you have MyChart) OR . A paper copy in the mail If you have any lab test that is abnormal or we need to change your treatment, we will call you to review the results.   Testing/Procedures: No new testing needed   Follow-Up: At [facility], you and your health needs are our priority.  As part of our continuing mission to provide you with exceptional heart care, we have created designated Provider Care Teams.  These Care Teams include your primary Cardiologist (physician) and Advanced Practice Providers (APPs -  Physician Assistants and Nurse Practitioners) who all work together to provide you with the care you need, when you need it.  . You will need a follow up appointment in 6 months  . Providers on your designated Care Team:   . Murray Hodgkins, NP . Christell Faith, PA-C . Marrianne Mood, PA-C  Any Other Special Instructions Will Be Listed Below (If Applicable).  COVID-19 Vaccine Information can be found at: [email or web address] For questions related to vaccine distribution or appointments, please email [email or web address] or call [phone].

## 2020-09-08 ENCOUNTER — Telehealth: Payer: Self-pay

## 2020-09-08 DIAGNOSIS — E1121 Type 2 diabetes mellitus with diabetic nephropathy: Secondary | ICD-10-CM

## 2020-09-08 DIAGNOSIS — Z794 Long term (current) use of insulin: Secondary | ICD-10-CM

## 2020-09-08 DIAGNOSIS — E039 Hypothyroidism, unspecified: Secondary | ICD-10-CM

## 2020-09-08 MED ORDER — LEVOTHYROXINE SODIUM 25 MCG PO TABS: ORAL_TABLET | ORAL | 1 refills | Status: DC

## 2020-09-08 NOTE — Telephone Encounter (Signed)
Pt called and said [facility] did not have refill for levothyroxine 25 mcg. I spoke with Stephen [name] at [facility] and they did have the refill for levothyroxine but it is not time to refill until 09/16/20. Pt said that at 07/26/20 visit Dr [name] changed instructions for levothyroxine 25 mcg to take one tab daily except take 2 tabs daily on Mon - Wed - and Fri.total of 10 pills per week. Pt said these were the last instructions for pt. Per chart pt should be taking as instructions above with pharmacy note as well. Stephen [name] said refill was done as sent in to [facility] from [place]. I changed pts med list back to levothyroxine 25 mcg one tab daily except take 2 tabs daily on Mon - Wed - Friday and sent new rx to [facility]. I called [facility] and spoke with Marcie Bal and had other levothyroxine 25 mcg rx cancelled so no more confusion with instructions at refill time. Sending note to Dr [name] for review as Stephen [name].

## 2020-09-08 NOTE — Chronic Care Management (AMB) (Addendum)
Chronic Care Management Pharmacy Assistant   Name: Stephen [name]  MRN: [id number] DOB: [birth year]  Reason for Encounter: Disease State- Diabetes and Hypertension   Conditions to be addressed/monitored: HTN and DMII  Recent office visits:  N/A  Recent consult visits:  09/06/20- Dr. Ida Rogue- Cardiology- started patient on Hydralazine  Hospital visits:  Admitted to the hospital on 08/19/20 due to unstable angina. Discharge date was 08/19/20. Discharged from [facility].     Medications: Outpatient Encounter Medications as of 09/08/2020  Medication Sig   ACCU-CHEK SOFTCLIX LANCETS lancets Use as instructed to check blood sugar three times daily and as needed.  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   acetaminophen (TYLENOL) 325 MG tablet Take 2 tablets (650 mg total) by mouth every 6 (six) hours as needed for mild pain (or Fever >/= 101).   albuterol (VENTOLIN HFA) 108 (90 Base) MCG/ACT inhaler Inhale 2 puffs into the lungs every 6 (six) hours as needed for wheezing or shortness of breath. Dispense 3 inhalers.   Alcohol Swabs (B-D SINGLE USE SWABS REGULAR) PADS Use to cleans area prior to checking blood sugar three times daily and as needed.  Diagnosis:  E11.22  Insulin dependent.   aspirin EC 81 MG tablet Take 324 mg by mouth daily. Swallow whole.   atorvastatin (LIPITOR) 80 MG tablet TAKE 1 TABLET AT BEDTIME   Blood Glucose Monitoring Suppl (ACCU-CHEK AVIVA PLUS) w/Device KIT Use to check blood sugar three times daily and as needed.  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   dorzolamide-timolol (COSOPT) 22.3-6.8 MG/ML ophthalmic solution Place 1 drop into both eyes in the morning  and at bedtime.   ezetimibe (ZETIA) 10 MG tablet TAKE 1 TABLET EVERY DAY   Fluticasone-Umeclidin-Vilant (TRELEGY ELLIPTA) 200-62.5-25 MCG/INH AEPB Inhale 1 puff into the lungs daily.   gabapentin (NEURONTIN) 400 MG capsule Take 2 tabs in the AM and 3 at night.   glucose blood (ACCU-CHEK AVIVA PLUS) test strip USE AS INSTRUCTED TO CHECK BLOOD SUGAR 3 TIMES DAILY OR AS NEEDED DX. E11.9   hydrALAZINE (APRESOLINE) 50 MG tablet Take 1 tablet (50 mg total) by mouth 3 (three) times daily.   HYDROcodone-acetaminophen (NORCO/VICODIN) 5-325 MG tablet Take 1 tablet by mouth every 6 (six) hours as needed for moderate pain.   insulin NPH-regular Human (NOVOLIN 70/30) (70-30) 100 UNIT/ML injection Inject 40-60 Units into the skin See admin instructions. Inject 60 units subcutaneously in the morning  & inject 40 units subcutaneously at bedtime.   Insulin Syringe-Needle U-100 (INSULIN SYRINGE 1CC/30GX5/16") 30G X 5/16" 1 ML MISC Use as directed to take insulin Dx E11.9   isosorbide mononitrate (IMDUR) 60 MG 24 hr tablet TAKE 1 AND 1/2 TABLETS TWICE DAILY   levothyroxine (SYNTHROID) 25 MCG tablet TAKE 1 TABLET BY MOUTH ONCE DAILY IN THE MORNING BEFORE BREAKFAST EXCEPT TAKE 2 TABLETS BY MOUTH DAILY ON MON. - WED. - FRI. TOTAL OF 10 PILLS PER WEEK.   nitroGLYCERIN (NITROSTAT) 0.4 MG SL tablet Place 1 tablet (0.4 mg total) under the tongue every 5 (five) minutes as needed. May repeat x3   omeprazole (PRILOSEC) 20 MG capsule Take 20 mg by mouth daily before  breakfast.   potassium chloride (K-DUR) 10 MEQ tablet Take 1-2 tablets (10-20 mEq total) by mouth as directed. Alternating every other day. Take 1 tablet (10 mEq) daily and 2 tablets (20 mEq) daily   Sennosides (EX-LAX) 15 MG CHEW Chew 15 mg by mouth 2 (two) times daily as needed (constipation).   sildenafil (REVATIO) 20 MG tablet TAKE ONE TABLET BY MOUTH THREE TIMES A DAY   tamsulosin (FLOMAX) 0.4 MG CAPS capsule TAKE 1 CAPSULE AT BEDTIME.   torsemide (DEMADEX) 20  MG tablet Take 1-2 tablets (20-40 mg total) by mouth as directed. Alternate every other day. 1 tablet (20 mg) daily and 2 tablets (40 mg) daily   No facility-administered encounter medications on file as of 09/08/2020.    Recent Office Vitals: BP Readings from Last 3 Encounters:  09/06/20 (!) 160/70  08/19/20 (!) 157/63  08/12/20 138/70   Pulse Readings from Last 3 Encounters:  09/06/20 60  08/19/20 62  08/12/20 (!) 57    Wt Readings from Last 3 Encounters:  09/06/20 268 lb (121.6 kg)  08/19/20 266 lb 12.1 oz (121 kg)  08/12/20 267 lb (121.1 kg)     Kidney Function Lab Results  Component Value Date/Time   CREATININE 2.55 (H) 08/12/2020 11:20 AM   CREATININE 2.15 (H) 07/01/2019 11:19 AM   CREATININE 1.79 (H) 07/21/2013 10:07 AM   GFR 30.42 (L) 07/01/2019 11:19 AM   GFRNONAA 24 (L) 08/12/2020 11:20 AM   GFRNONAA 39 (L) 07/21/2013 10:07 AM   GFRAA 28 (L) 08/12/2020 11:20 AM   GFRAA 45 (L) 07/21/2013 10:07 AM    BMP Latest Ref Rng & Units 08/12/2020 07/01/2019 12/23/2018  Glucose 65 - 99 mg/dL 31(LL) 81 93  BUN 8 - 27 mg/dL 28(H) 28(H) 25(H)  Creatinine 0.76 - 1.27 mg/dL 2.55(H) 2.15(H) 1.82(H)  BUN/Creat Ratio 10 - 24 11 - -  Sodium 134 - 144 mmol/L 148(H) 142 141  Potassium 3.5 - 5.2 mmol/L 4.1 3.9 4.2  Chloride 96 - 106 mmol/L 112(H) 109 108  CO2 20 - 29 mmol/L $RemoveB'24 28 27  'VKoESGdZ$ Calcium 8.6 - 10.2 mg/dL 8.6 8.5 8.4   Multiple attempts were made to contact patient to review BP and BG readings. No voicemail set up. Will follow up in late April.   Current antihypertensive regimen:  Carvedilol 12.5 mg - 1 tablet twice daily with meals Clonidine 0.3 mg - 1 tablet three times daily Hydralazine $RemoveBeforeDEI'50mg'[id number]$  - 1 tablet three times daily  What recent interventions/DTPs have been made by any provider to improve Blood Pressure control since last CPP Visit: Dr. Rockey Situ started patient on hydralazine 50 mg TID  Adherence Review: Is the patient currently on ACE/ARB medication? No Does the  patient have >5 day gap between last estimated fill dates? CPP to review  Recent Relevant Labs: Lab Results  Component Value Date/Time   HGBA1C 6.9 (H) 07/12/2020 09:34 AM   HGBA1C 7.4 (H) 03/16/2020 09:56 AM   MICROALBUR 221.2 Repeated and verified X2. (H) 09/15/2013 10:09 AM    Kidney Function Lab Results  Component Value Date/Time   CREATININE 2.55 (H) 08/12/2020 11:20 AM   CREATININE 2.15 (H) 07/01/2019 11:19 AM   CREATININE 1.79 (H) 07/21/2013 10:07 AM   GFR 30.42 (L) 07/01/2019 11:19 AM   GFRNONAA 24 (L) 08/12/2020 11:20 AM   GFRNONAA 39 (L) 07/21/2013 10:07 AM   GFRAA 28 (L) 08/12/2020 11:20 AM   GFRAA 45 (L) 07/21/2013 10:07 AM     Current antihyperglycemic regimen:  Insulin 70/30 (NPH/regular) - Inject 70 units before breakfast and 40-50 units before evening meal (reports taking 70 in AM and 45-50 at night, depending on how high his before supper BG is)   Have there been any recent hospitalizations or ED visits since last visit with CPP? Yes  08/19/20 for cardiac procedure   Adherence Review: Is the patient currently on a STATIN medication? Yes Is the patient currently on ACE/ARB medication? No Does the patient have >5 day gap between last estimated fill dates? CPP to review   Star Rating Drugs: Atorvastatin 80 mg- last fill 06/22/20- 90 DS  Follow-Up:  Pharmacist Review  Debbora Dus, CPP notified  Margaretmary Dys, [place] Assistant [phone]  I have reviewed the care management and care coordination activities outlined in this encounter and I am certifying that I agree with the content of this note. No further action required.  Debbora Dus, PharmD Clinical Pharmacist [facility] at [facility] [phone]

## 2020-09-12 NOTE — Telephone Encounter (Signed)
Noted. Thanks.

## 2020-09-24 DIAGNOSIS — J449 Chronic obstructive pulmonary disease, unspecified: Secondary | ICD-10-CM | POA: Diagnosis not present

## 2020-09-27 ENCOUNTER — Other Ambulatory Visit: Payer: Self-pay | Admitting: Family Medicine

## 2020-09-27 DIAGNOSIS — E1121 Type 2 diabetes mellitus with diabetic nephropathy: Secondary | ICD-10-CM

## 2020-09-27 DIAGNOSIS — Z794 Long term (current) use of insulin: Secondary | ICD-10-CM

## 2020-09-29 ENCOUNTER — Other Ambulatory Visit: Payer: Self-pay | Admitting: Cardiovascular Disease

## 2020-09-29 ENCOUNTER — Other Ambulatory Visit: Payer: Self-pay | Admitting: Family Medicine

## 2020-09-29 ENCOUNTER — Other Ambulatory Visit: Payer: Self-pay | Admitting: Family

## 2020-09-29 DIAGNOSIS — E113213 Type 2 diabetes mellitus with mild nonproliferative diabetic retinopathy with macular edema, bilateral: Secondary | ICD-10-CM | POA: Diagnosis not present

## 2020-09-29 DIAGNOSIS — E113313 Type 2 diabetes mellitus with moderate nonproliferative diabetic retinopathy with macular edema, bilateral: Secondary | ICD-10-CM | POA: Diagnosis not present

## 2020-10-04 DIAGNOSIS — D631 Anemia in chronic kidney disease: Secondary | ICD-10-CM | POA: Diagnosis not present

## 2020-10-04 DIAGNOSIS — I10 Essential (primary) hypertension: Secondary | ICD-10-CM | POA: Diagnosis not present

## 2020-10-04 DIAGNOSIS — N184 Chronic kidney disease, stage 4 (severe): Secondary | ICD-10-CM | POA: Diagnosis not present

## 2020-10-04 DIAGNOSIS — I701 Atherosclerosis of renal artery: Secondary | ICD-10-CM | POA: Diagnosis not present

## 2020-10-04 DIAGNOSIS — N2581 Secondary hyperparathyroidism of renal origin: Secondary | ICD-10-CM | POA: Diagnosis not present

## 2020-10-04 DIAGNOSIS — E1122 Type 2 diabetes mellitus with diabetic chronic kidney disease: Secondary | ICD-10-CM | POA: Diagnosis not present

## 2020-10-06 ENCOUNTER — Encounter: Payer: Self-pay | Admitting: Pulmonary Disease

## 2020-10-06 ENCOUNTER — Other Ambulatory Visit: Payer: Self-pay

## 2020-10-06 ENCOUNTER — Ambulatory Visit: Payer: Medicare HMO | Admitting: Pulmonary Disease

## 2020-10-06 VITALS — BP 140/80 | HR 66 | Temp 98.7°F | Ht 65.0 in | Wt 271.6 lb

## 2020-10-06 DIAGNOSIS — Z9989 Dependence on other enabling machines and devices: Secondary | ICD-10-CM | POA: Diagnosis not present

## 2020-10-06 DIAGNOSIS — I272 Pulmonary hypertension, unspecified: Secondary | ICD-10-CM

## 2020-10-06 DIAGNOSIS — E662 Morbid (severe) obesity with alveolar hypoventilation: Secondary | ICD-10-CM

## 2020-10-06 DIAGNOSIS — G4733 Obstructive sleep apnea (adult) (pediatric): Secondary | ICD-10-CM | POA: Diagnosis not present

## 2020-10-06 DIAGNOSIS — J3089 Other allergic rhinitis: Secondary | ICD-10-CM | POA: Diagnosis not present

## 2020-10-06 DIAGNOSIS — J9611 Chronic respiratory failure with hypoxia: Secondary | ICD-10-CM

## 2020-10-06 DIAGNOSIS — J849 Interstitial pulmonary disease, unspecified: Secondary | ICD-10-CM

## 2020-10-06 DIAGNOSIS — I5032 Chronic diastolic (congestive) heart failure: Secondary | ICD-10-CM

## 2020-10-06 MED ORDER — MONTELUKAST SODIUM 10 MG PO TABS: 10.0000 mg | ORAL_TABLET | Freq: Every day | ORAL | 2 refills | Status: DC

## 2020-10-06 NOTE — Patient Instructions (Addendum)
We have sent a prescription for Singulair to your pharmacy.  This should help with your nasal symptoms.  Use nasal saline spray several times.  You have a lot of fluid in your system which is aggravating your symptoms.  This in turn may be making your tissues in your nose to swell as well.  Hopefully the Singulair will help with this.  We will schedule you for breathing tests.  We will order another CT chest.  This will be a high resolution CT.  We will see you in follow-up in 2 months time call sooner should any new problems arise.

## 2020-10-06 NOTE — Progress Notes (Signed)
Subjective:    Patient ID: Stephen [name], male    DOB: [birth year], 75 y.o.   MRN: [id number] Chief Complaint  Patient presents with   Follow-up    Oxygen level dropping to 70's, when it was cool could not go outside could not breathe.  Cold for 2-3 months.  Cough, non productive.  Congestion in his nose and chest.  No fever, body aches or chills.  Home covid test x2 done, both negative.  Last one was week before last.    HPI Patient is a 75 year old lifelong never smoker with a very complex history as noted below, who follows here for various issues.  Last visit with me was on 13 May 2020 at that time we continued Trelegy Ellipta for an element of moderate persistent asthma.  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  He has had increased lower extremity edema to 3+.  He is working with cardiology and renal with regards to diuretic therapy.  Today he is quite cantankerous.  He states that he has "a cold" states that he has significant nasal congestion and that is what is bothering him the most.  He has been having episodes of desaturations when he is exposed to outside air.  Negative COVID testing x2 at home.  No fevers, chills or sweats.  No hemoptysis.  As noted he is irascible today and no further elaboration on symptoms could be obtained.  Synopsis of his complex medical history as below: 1.  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 2.  Negative connective tissue disease work-up May 2010 at [facility] by Dr. Thom Chimes. 3.  Chronic hypoxemic respiratory failure due to the above.  On 5 L/min nasal cannula O2. 4.  Labeled as COPD however this is an erroneous diagnosis see below. 5.  Obstructive sleep apnea on CPAP the  patient believes at 16 cm H2O. 6.  Coronary artery disease status post PTCI 7.  Chronic diastolic heart failure,left ventricular ejection fraction 45 to 50% 3/20 8.  Pulmonary hypertension with elevated left ventricular end-diastolic pressure 9.  CKD III 10.  Diabetes mellitus type 2 11.  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  BMI 45  Review of Systems A 10 point review of systems was performed and it is as noted above otherwise negative.  Patient Active Problem List   Diagnosis Date Noted   Unstable angina ([place]) 08/19/2020   Pulmonary HTN ([place]) 08/19/2020   Tremor 07/29/2020   Hypoxia 03/21/2020   Left leg cellulitis 09/16/2019   Asthma 03/31/2019   Renal artery stenosis ([place]) 02/24/2019   Advance care planning 12/26/2018   Hypothyroidism 12/26/2018   HLD (hyperlipidemia) 12/26/2018   Coronary artery disease involving native coronary artery of native heart with angina pectoris ([place]) 10/09/2018   Pulmonary hypertension, primary ([place]) 09/26/2018   CHF (congestive heart failure) ([place]) 09/20/2018   Knee pain 03/26/2018   Encounter for chronic pain management 12/23/2017   Healthcare maintenance 09/21/2017   Skin lesion 06/15/2017   Acute on chronic renal failure ([place]) 01/15/2017   Coronary stent occlusion: Occluded circumflex stent 01/13/2017   Accelerated hypertension with diastolic congestive heart failure, NYHA class 3 ([place]); elevated LVEDP on chronic catheterization. 01/13/2017   NSTEMI (non-ST elevated myocardial infarction) ([place]) 01/11/2017   Blood  in urine 12/08/2016   Lymphedema 10/23/2016   Low back pain with right-sided sciatica 04/03/2014   Muscle spasm 04/24/2013   Chronic edema 11/13/2012   Leg edema, right 09/29/2012   SK (seborrheic keratosis) 09/29/2012   Chronic diastolic CHF (congestive heart failure) ([place]) 12/15/2011   Sebaceous cyst 11/28/2011   Morbid obesity ([place]) 07/27/2011   Hyperlipidemia with target low density lipoprotein (LDL)  cholesterol less than 70 mg/dL 04/26/2011   Adrenal adenoma 03/02/2011   Back pain 12/06/2010   Kidney stones 12/06/2010   Essential hypertension, benign 10/28/2010   PULMONARY FIBROSIS 09/15/2010   CAD (coronary artery disease) 09/01/2010   Type 2 diabetes mellitus with renal complication ([place]) 39/76/7341   METABOLIC SYNDROME X 93/79/0240   SLEEP APNEA, OBSTRUCTIVE 08/31/2010   Chronic kidney disease, stage III (moderate) ([place]) 08/31/2010   Social History   Tobacco Use   Smoking status: Never Smoker   Smokeless tobacco: Never Used  Substance Use Topics   Alcohol use: No    Alcohol/week: 0.0 standard drinks   Allergies  Allergen Reactions   Calcium Channel Blockers     Would avoid if possible due to h/o peripheral edema   Current Meds  Medication Sig   ACCU-CHEK SOFTCLIX LANCETS lancets Use as instructed to check blood sugar three times daily and as needed.  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   acetaminophen (TYLENOL) 325 MG tablet Take 2 tablets (650 mg total) by mouth every 6 (six) hours as needed for mild pain (or Fever >/= 101).   albuterol (VENTOLIN HFA) 108 (90 Base) MCG/ACT inhaler Inhale 2 puffs into the lungs every 6 (six) hours as needed for wheezing or shortness of breath. Dispense 3 inhalers.   Alcohol Swabs (B-D SINGLE USE SWABS REGULAR) PADS USE TO CLEANS AREA PRIOR TO CHECKING BLOOD SUGAR THREE TIMES DAILY AND AS NEEDED   aspirin EC 81 MG tablet Take 324 mg by mouth daily. Swallow whole.   atorvastatin (LIPITOR) 80 MG tablet TAKE 1 TABLET AT BEDTIME   Blood Glucose Monitoring Suppl (ACCU-CHEK AVIVA PLUS) w/Device KIT Use to check blood sugar three times daily and as needed.  Diagnosis: E11.22  Insulin-dependent   carvedilol (COREG) 12.5 MG tablet TAKE 1 TABLET TWO TIMES DAILY WITH MEALS   cloNIDine (CATAPRES) 0.3 MG tablet TAKE 1 TABLET THREE TIMES DAILY   clopidogrel (PLAVIX) 75 MG tablet TAKE 1 TABLET EVERY DAY   diphenhydrAMINE (BENADRYL) 50 MG tablet Take  50 mg by mouth at bedtime as needed for allergies.   dorzolamide-timolol (COSOPT) 22.3-6.8 MG/ML ophthalmic solution Place 1 drop into both eyes in the morning and at bedtime.   ezetimibe (ZETIA) 10 MG tablet TAKE 1 TABLET EVERY DAY   Fluticasone-Umeclidin-Vilant (TRELEGY ELLIPTA) 200-62.5-25 MCG/INH AEPB Inhale 1 puff into the lungs daily.   gabapentin (NEURONTIN) 400 MG capsule Take 2 tabs in the AM and 3 at night.   glucose blood (ACCU-CHEK AVIVA PLUS) test strip USE AS INSTRUCTED TO CHECK BLOOD SUGAR 3 TIMES DAILY OR AS NEEDED   hydrALAZINE (APRESOLINE) 50 MG tablet Take 1 tablet (50 mg total) by mouth 3 (three) times daily.   HYDROcodone-acetaminophen (NORCO/VICODIN) 5-325 MG tablet Take 1 tablet by mouth every 6 (six) hours as needed for moderate pain.   insulin NPH-regular Human (NOVOLIN 70/30) (70-30) 100 UNIT/ML injection Inject 40-60 Units into the skin See admin instructions. Inject 60 units subcutaneously in the morning  & inject 40 units subcutaneously at bedtime.   Insulin Syringe-Needle U-100 (INSULIN SYRINGE 1CC/30GX5/16") 30G X  5/16" 1 ML MISC Use as directed to take insulin Dx E11.9   isosorbide mononitrate (IMDUR) 60 MG 24 hr tablet TAKE 1 AND 1/2 TABLETS TWICE DAILY   levothyroxine (SYNTHROID) 25 MCG tablet TAKE 1 TABLET BY MOUTH ONCE DAILY IN THE MORNING BEFORE BREAKFAST EXCEPT TAKE 2 TABLETS BY MOUTH DAILY ON MON. - WED. - FRI. TOTAL OF 10 PILLS PER WEEK.   nitroGLYCERIN (NITROSTAT) 0.4 MG SL tablet Place 1 tablet (0.4 mg total) under the tongue every 5 (five) minutes as needed. May repeat x3   omeprazole (PRILOSEC) 20 MG capsule Take 20 mg by mouth daily before breakfast.   potassium chloride (K-DUR) 10 MEQ tablet Take 1-2 tablets (10-20 mEq total) by mouth as directed. Alternating every other day. Take 1 tablet (10 mEq) daily and 2 tablets (20 mEq) daily   Sennosides (EX-LAX) 15 MG CHEW Chew 15 mg by mouth 2 (two) times daily as needed (constipation).   sildenafil (REVATIO) 20  MG tablet TAKE ONE TABLET BY MOUTH THREE TIMES A DAY   tamsulosin (FLOMAX) 0.4 MG CAPS capsule TAKE 1 CAPSULE AT BEDTIME.   Immunization History  Administered Date(s) Administered   Fluad Quad(high Dose 65+) 03/27/2019, 03/19/2020   Influenza Split 04/25/2011, 05/17/2012   Influenza,inj,Quad PF,6+ Mos 04/22/2013, 04/03/2014, 03/02/2015, 03/02/2016, 03/12/2017, 03/25/2018   Moderna Sars-Covid-2 Vaccination 09/01/2019, 09/29/2019, 04/25/2020   Pneumococcal Conjugate-13 10/26/2014   Pneumococcal Polysaccharide-23 06/01/2009, 09/15/2013       Objective:   Physical Exam BP 140/80 (BP Location: Left Arm, Patient Position: Sitting, Cuff Size: Large)   Pulse 66   Temp 98.7 F (37.1 C) (Temporal)   Ht $R'5\' 5"'Ng$  (1.651 m)   Wt 271 lb 9.6 oz (123.2 kg)   SpO2 90%   BMI 45.20 kg/m  GENERAL: Morbidly obese gentleman, no acute distress, comfortable on nasal cannula O2 (3 L/min pulsed) and fully ambulatory. HEAD: Normocephalic, atraumatic.  EYES: Pupils equal, round, reactive to light.  No scleral icterus.  MOUTH: Nose/mouth/throat not examined due to masking requirements for COVID 19. NECK: Supple. No thyromegaly. Trachea midline. No JVD.  No adenopathy. PULMONARY: Good air entry bilaterally.  Decreased breath sounds on the right base (chronic right hemidiaphragm elevation) otherwise, lungs clear to auscultation bilaterally. CARDIOVASCULAR: S1 and S2.  Bradycardic rate and regular rhythm.  No overt rubs, murmurs or gallops heard. GASTROINTESTINAL: Protuberant, obese abdomen. MUSCULOSKELETAL: No joint deformity, no clubbing, 3+ edema to mid shin, wears TED hose.  NEUROLOGIC: Awake, alert, no focal deficits noted.  No gait disturbance noted on ambulation.  Speech is fluent. SKIN: Intact,warm,dry. PSYCH: Flat affect, behavior normal.      Assessment & Plan:     ICD-10-CM   1. ILD (interstitial lung disease) (HCC)  J84.9 CT Chest High Resolution   Due to metal dust  pneumoconiosis Biopsy-proven CT high-res/PFTs This is not progressive    2. Chronic respiratory failure with hypoxia (HCC)  J96.11 Pulmonary Function Test [facility] Only   Continue oxygen at 2 L/min Mostly related to extreme obesity with alveolar hypoventilation    3. Chronic diastolic CHF (congestive heart failure) (HCC)  I50.32    Managed per cardiology This issue adds complexity to his management    4. Non-seasonal allergic rhinitis, unspecified trigger  J30.89    Trial of Singulair    5. Pulmonary HTN (HCC)  I27.20 Pulmonary Function Test [facility] Only   Likely due to hypoxic vasoconstriction Continue oxygen supplementation From my standpoint would recommend discontinuation of sildenafil    6. OSA on  CPAP  G47.33    Z99.89    Continue CPAP Patient compliant    7. Extreme obesity with alveolar hypoventilation (HCC)  E66.2    Weight loss recommended     Orders Placed This Encounter  Procedures   CT Chest High Resolution    Standing Status:   Future    Number of Occurrences:   1    Standing Expiration Date:   10/06/2021    Scheduling Instructions:     Next available.    Order Specific Question:   Preferred imaging location?    Answer:   [facility]   Pulmonary Function Test [facility] Only    3 weeks    Standing Status:   Future    Number of Occurrences:   1    Standing Expiration Date:   10/06/2021    Order Specific Question:   Full PFT: includes the following: basic spirometry, spirometry pre & post bronchodilator, diffusion capacity (DLCO), lung volumes    Answer:   Full PFT   Meds ordered this encounter  Medications   DISCONTD: montelukast (SINGULAIR) 10 MG tablet    Sig: Take 1 tablet (10 mg total) by mouth daily.    Dispense:  30 tablet    Refill:  2   We will see the patient in 2 months time he is to contact us sooner should any new difficulties arise.  Renold Don, MD Advanced Bronchoscopy PCCM [facility]    *This note was dictated  using voice recognition software/Dragon.  Despite best efforts to proofread, errors can occur which can change the meaning.  Any change was purely unintentional.

## 2020-10-13 ENCOUNTER — Other Ambulatory Visit: Payer: Self-pay

## 2020-10-13 ENCOUNTER — Ambulatory Visit
Admission: RE | Admit: 2020-10-13 | Discharge: 2020-10-13 | Disposition: A | Discharge status: Home / Self Care | Payer: Medicare HMO | Source: Ambulatory Visit | Attending: Pulmonary Disease | Admitting: Pulmonary Disease

## 2020-10-13 DIAGNOSIS — J849 Interstitial pulmonary disease, unspecified: Secondary | ICD-10-CM | POA: Diagnosis not present

## 2020-10-13 DIAGNOSIS — R0602 Shortness of breath: Secondary | ICD-10-CM | POA: Diagnosis not present

## 2020-10-19 ENCOUNTER — Telehealth: Payer: Self-pay

## 2020-10-19 NOTE — Telephone Encounter (Signed)
Tried calling patient to notify him of his covid test on 10/22/2020 @ 11:00 pt voicemail full.

## 2020-10-20 NOTE — Telephone Encounter (Signed)
ACT patient--unable to leave vm due to mailbox not being setup.  Will close encounter per office protocol.

## 2020-10-21 ENCOUNTER — Encounter: Payer: Self-pay | Admitting: Pulmonary Disease

## 2020-10-21 NOTE — Progress Notes (Signed)
Subjective:    Patient ID: Stephen [name], male    DOB: [birth year], 75 y.o.   MRN: [id number]  HPI This is a 75 year old lifelong never smoker, with a very complex history as noted on his initial visit of 28 March 2019.  He was last seen here on 22 January 2020 and noted that he was doing well on Trelegy Ellipta.  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  He has chronic respiratory failure with hypoxia requiring oxygen chronically, currently at 3 liters per minute pulsed.  He presents today noting continued marked relief of his respiratory symptoms with Trelegy Ellipta.  States that overall he is doing "good".  Dyspnea is class III (MRC).  He has not had any fevers, chills or sweats no cough or sputum production.  Doing relatively well.  He is comfortable with nasal cannula O2.  Synopsis of his complex medical history as below: 1. 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 2. Negative connective tissue disease work-up May 2010 at [facility] by Dr. Thom Chimes. 3. Chronic hypoxemic respiratory failure due to the above. On 5 L/min nasal cannula O2. 4. Labeled as COPD however this is an erroneous diagnosis see below. 5. Obstructive sleep apnea on CPAP the patient believes at 16 cm H2O. 6. Coronary artery disease status post PTCI 7. Chronic diastolic heart failure,left ventricular ejection fraction 45 to 50% 3/20 8. Pulmonary hypertension with elevated left ventricular end-diastolic pressure 9. CKD III 10. Diabetes mellitus type 2 11. 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 BMI 45  Review of Systems A 10 point review of systems was performed and it is as noted above otherwise negative.  Patient Active Problem List   Diagnosis Date Noted  . Hypoxia 03/21/2020  . Left leg cellulitis 09/16/2019  . Asthma 03/31/2019  . Renal artery stenosis  ([place]) 02/24/2019  . Advance care planning 12/26/2018  . Hypothyroidism 12/26/2018  . HLD (hyperlipidemia) 12/26/2018  . Coronary artery disease involving native coronary artery of native heart with angina pectoris ([place]) 10/09/2018  . Pulmonary hypertension, primary ([place]) 09/26/2018  . CHF (congestive heart failure) ([place]) 09/20/2018  . Knee pain 03/26/2018  . Encounter for chronic pain management 12/23/2017  . Healthcare maintenance 09/21/2017  . Skin lesion 06/15/2017  . Acute on chronic renal failure ([place]) 01/15/2017  . Coronary stent occlusion: Occluded circumflex stent 01/13/2017  . Accelerated hypertension with diastolic congestive heart failure, NYHA class 3 ([place]); elevated LVEDP on chronic catheterization. 01/13/2017  . NSTEMI (non-ST elevated myocardial infarction) ([place]) 01/11/2017  . Blood in urine 12/08/2016  . Lymphedema 10/23/2016  . Low back pain with right-sided sciatica 04/03/2014  . Muscle spasm 04/24/2013  . Chronic edema 11/13/2012  . Leg edema, right 09/29/2012  . SK (seborrheic keratosis) 09/29/2012  . Chronic diastolic CHF (congestive heart failure) ([place]) 12/15/2011  . Sebaceous cyst 11/28/2011  . Morbid obesity ([place]) 07/27/2011  . Hyperlipidemia with target low density lipoprotein (LDL) cholesterol less than 70 mg/dL 04/26/2011  . Adrenal adenoma 03/02/2011  . Back pain 12/06/2010  . Kidney stones 12/06/2010  . Essential hypertension, benign 10/28/2010  . PULMONARY FIBROSIS 09/15/2010  . CAD (coronary artery disease) 09/01/2010  . Type 2 diabetes mellitus with renal complication ([place]) 25/00/3704  . METABOLIC SYNDROME X 88/89/1694  . SLEEP APNEA, OBSTRUCTIVE 08/31/2010  . Chronic kidney disease, stage III (moderate) ([place]) 08/31/2010   Social History  Tobacco Use  . Smoking status: Never Smoker  . Smokeless tobacco: Never Used  Substance Use Topics  . Alcohol use: No    Alcohol/week: 0.0 standard drinks   Allergies  Allergen Reactions  . Calcium  Channel Blockers     Would avoid if possible due to h/o peripheral edema   Current medications were reviewed.  Immunization History  Administered Date(s) Administered  . Fluad Quad(high Dose 65+) 03/27/2019, 03/19/2020  . Influenza Split 04/25/2011, 05/17/2012  . Influenza,inj,Quad PF,6+ Mos 04/22/2013, 04/03/2014, 03/02/2015, 03/02/2016, 03/12/2017, 03/25/2018  . Moderna Sars-Covid-2 Vaccination 09/01/2019, 09/29/2019, 04/25/2020  . Pneumococcal Conjugate-13 10/26/2014  . Pneumococcal Polysaccharide-23 06/01/2009, 09/15/2013        Objective:   Physical Exam BP (!) 144/70 (BP Location: Left Arm, Patient Position: Sitting, Cuff Size: Normal)   Pulse (!) 52   Temp 97.8 F (36.6 C) (Temporal)   Ht 5\' 5"  (1.651 m)   Wt 269 lb (122 kg)   SpO2 98%   BMI 44.76 kg/m   GENERAL: Morbidly obese gentleman, no acute distress, comfortable on nasal cannula O2 (3 L/min pulsed) and fully ambulatory. HEAD: Normocephalic, atraumatic.  EYES: Pupils equal, round, reactive to light.  No scleral icterus.  MOUTH: Nose/mouth/throat not examined due to masking requirements for COVID 19. NECK: Supple. No thyromegaly. Trachea midline. No JVD.  No adenopathy. PULMONARY: Good air entry bilaterally.  Decreased breath sounds on the right base (chronic right hemidiaphragm elevation) otherwise, lungs clear to auscultation bilaterally. CARDIOVASCULAR: S1 and S2.  Bradycardic rate and regular rhythm.  No overt rubs, murmurs or gallops heard. GASTROINTESTINAL: Protuberant, obese abdomen. MUSCULOSKELETAL: No joint deformity, no clubbing, 2-3+ edema to mid shin, wears TED hose.  NEUROLOGIC: Awake, alert, no focal deficits noted.  No gait disturbance noted on ambulation.  Speech is fluent. SKIN: Intact,warm,dry. PSYCH: Flat affect, behavior normal.    Assessment & Plan:     ICD-10-CM   1. Moderate persistent asthma, unspecified whether complicated  Z20.80    Continue Trelegy Ellipta Appears to be well  compensated in this regard  2. ILD (interstitial lung disease) (HCC)  J84.9    Metal dust pneumoconiosis Biopsy-proven  3. Chronic respiratory failure with hypoxia (HCC)  J96.11    Multifactorial Obesity with OHVS ILD Cardiomyopathy  4. Extreme obesity with alveolar hypoventilation (HCC)  E66.2    This issue adds complexity to his management   Follow-up in 4 months time he is to call sooner should any new problems arise.  Overall he appears to be well compensated or at least as compensated as he is to get.  Renold Don, MD [facility]    *This note was dictated using voice recognition software/Dragon.  Despite best efforts to proofread, errors can occur which can change the meaning.  Any change was purely unintentional.

## 2020-10-22 ENCOUNTER — Other Ambulatory Visit: Payer: Self-pay

## 2020-10-22 ENCOUNTER — Other Ambulatory Visit
Admission: RE | Admit: 2020-10-22 | Discharge: 2020-10-22 | Disposition: A | Discharge status: Home / Self Care | Payer: Medicare HMO | Source: Ambulatory Visit | Attending: Pulmonary Disease | Admitting: Pulmonary Disease

## 2020-10-22 ENCOUNTER — Other Ambulatory Visit: Payer: Medicare HMO

## 2020-10-22 DIAGNOSIS — Z20822 Contact with and (suspected) exposure to covid-19: Secondary | ICD-10-CM | POA: Diagnosis not present

## 2020-10-22 DIAGNOSIS — Z01812 Encounter for preprocedural laboratory examination: Secondary | ICD-10-CM | POA: Insufficient documentation

## 2020-10-22 LAB — SARS CORONAVIRUS 2 (TAT 6-24 HRS): SARS Coronavirus 2: NEGATIVE

## 2020-10-25 ENCOUNTER — Telehealth: Payer: Self-pay | Admitting: Cardiovascular Disease

## 2020-10-25 ENCOUNTER — Other Ambulatory Visit: Payer: Self-pay

## 2020-10-25 ENCOUNTER — Ambulatory Visit: Payer: Medicare HMO | Attending: Pulmonary Disease

## 2020-10-25 DIAGNOSIS — I272 Pulmonary hypertension, unspecified: Secondary | ICD-10-CM | POA: Insufficient documentation

## 2020-10-25 DIAGNOSIS — J9611 Chronic respiratory failure with hypoxia: Secondary | ICD-10-CM | POA: Diagnosis not present

## 2020-10-25 DIAGNOSIS — J449 Chronic obstructive pulmonary disease, unspecified: Secondary | ICD-10-CM | POA: Diagnosis not present

## 2020-10-25 MED ORDER — ALBUTEROL SULFATE (2.5 MG/3ML) 0.083% IN NEBU
2.5000 mg | INHALATION_SOLUTION | Freq: Once | RESPIRATORY_TRACT | Status: AC
  Administered 2020-10-25: 2.5 mg via RESPIRATORY_TRACT
  Filled 2020-10-25: qty 3

## 2020-10-25 NOTE — Telephone Encounter (Signed)
Was able to return call to pt's wife [name], (DPR approved), she reports since last visit with Dr. Rockey Situ, pt has increased SOB with exertion and moving around. Recent chest CT d/t pulmonary fibrosis and interstitial lung disease order by Dr. Patsey Berthold who advised   CT shows scarring as previous. He did have some fluid surrounding the lungs this is likely from his heart function being stiff. He needs to keep taking fluid pills as directed by the heart doctor as fluid can make his shortness of breath worse  [name] reports Mr. Stalvey has been taking extra torsemide 20 mg as needed for retaining fluid, especially around his left eye, noticeable swelling. Swelling around eye could be allergy related, however [name] is concern d/t Dr. Domingo Dimes prognosis of fluid build up around his lung. Pt not SOB at current, no CP, advised needed appt for re-evaluation, recent echo Feb 2022, followed by a heart cath in Feb as well. Wife requesting appt with Dr. Rockey Situ, however he is booked until June 2, opening with Blanche East, PA-C this Friday 4/29 at 3:30, [name] agrees with appt and will take that open slot.   Advised if SOB worsen, pt experience, CP, dizziness, weakness, changes in BP or HR, to call the clinic for advice or seek the ED for a complete cardiac evaluation. [name] agreeable to plan, all questions or concerns were address and no additional concerns at this time, will call back for anything further.

## 2020-10-25 NOTE — Telephone Encounter (Signed)
Pt c/o Shortness Of Breath: STAT if SOB developed within the last 24 hours or pt is noticeably SOB on the phone  1. Are you currently SOB (can you hear that pt is SOB on the phone)? no  2. How long have you been experiencing SOB? A month or more   3. Are you SOB when sitting or when up moving around? When moving around   4. Are you currently experiencing any other symptoms? Patient wife calling.  States he is also retaining fluid - swelling under left eye - he has been taking extra fluid pills but it does not seem to help.  Please call to discuss.

## 2020-10-27 ENCOUNTER — Telehealth: Payer: Self-pay

## 2020-10-27 NOTE — Chronic Care Management (AMB) (Addendum)
Chronic Care Management Pharmacy Assistant   Name: Stephen [name]  MRN: [id number] DOB: Jun 06, 1946   Reason for Encounter: Disease State   Conditions to be addressed/monitored: HTN and DMII  Recent office visits:  None since last CCM contact  Recent consult visits:  10/06/2020 - Dr.Carmen Patsey Berthold, Pulmonology - Started Montelukast 10 mg  10/04/2020 - Dr.Munsoor Lateef, Nephrology - Labs ordered        Hospital visits: Admitted to the hospital on 08/19/2020 for coronary angiography.   Medications: Outpatient Encounter Medications as of 10/27/2020  Medication Sig   ACCU-CHEK SOFTCLIX LANCETS lancets Use as instructed to check blood sugar three times daily and as needed.  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   acetaminophen (TYLENOL) 325 MG tablet Take 2 tablets (650 mg total) by mouth every 6 (six) hours as needed for mild pain (or Fever >/= 101).   albuterol (VENTOLIN HFA) 108 (90 Base) MCG/ACT inhaler Inhale 2 puffs into the lungs every 6 (six) hours as needed for wheezing or shortness of breath. Dispense 3 inhalers.   Alcohol Swabs (B-D SINGLE USE SWABS REGULAR) PADS USE TO CLEANS AREA PRIOR TO CHECKING BLOOD SUGAR THREE TIMES DAILY AND AS NEEDED   aspirin EC 81 MG tablet Take 324 mg by mouth daily. Swallow whole.   atorvastatin (LIPITOR) 80 MG tablet TAKE 1 TABLET AT BEDTIME   Blood Glucose Monitoring Suppl (ACCU-CHEK AVIVA PLUS) w/Device KIT Use to check blood sugar three times daily and as needed.  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   dorzolamide-timolol (COSOPT) 22.3-6.8 MG/ML ophthalmic solution Place 1 drop into both eyes in the morning and at bedtime.   ezetimibe  (ZETIA) 10 MG tablet TAKE 1 TABLET EVERY DAY   Fluticasone-Umeclidin-Vilant (TRELEGY ELLIPTA) 200-62.5-25 MCG/INH AEPB Inhale 1 puff into the lungs daily.   gabapentin (NEURONTIN) 400 MG capsule Take 2 tabs in the AM and 3 at night.   glucose blood (ACCU-CHEK AVIVA PLUS) test strip USE AS INSTRUCTED TO CHECK BLOOD SUGAR 3 TIMES DAILY OR AS NEEDED   hydrALAZINE (APRESOLINE) 50 MG tablet Take 1 tablet (50 mg total) by mouth 3 (three) times daily.   HYDROcodone-acetaminophen (NORCO/VICODIN) 5-325 MG tablet Take 1 tablet by mouth every 6 (six) hours as needed for moderate pain.   insulin NPH-regular Human (NOVOLIN 70/30) (70-30) 100 UNIT/ML injection Inject 40-60 Units into the skin See admin instructions. Inject 60 units subcutaneously in the morning  & inject 40 units subcutaneously at bedtime.   Insulin Syringe-Needle U-100 (INSULIN SYRINGE 1CC/30GX5/16") 30G X 5/16" 1 ML MISC Use as directed to take insulin Dx E11.9   isosorbide mononitrate (IMDUR) 60 MG 24 hr tablet TAKE 1 AND 1/2 TABLETS TWICE DAILY   levothyroxine (SYNTHROID) 25 MCG tablet TAKE 1 TABLET BY MOUTH ONCE DAILY IN THE MORNING BEFORE BREAKFAST EXCEPT TAKE 2 TABLETS BY MOUTH DAILY ON MON. - WED. - FRI. TOTAL OF 10 PILLS PER WEEK.   montelukast (SINGULAIR) 10 MG tablet Take 1 tablet (10 mg total) by mouth daily.   nitroGLYCERIN (NITROSTAT) 0.4 MG SL tablet Place 1 tablet (0.4 mg total) under the tongue every 5 (five) minutes as needed. May repeat x3  omeprazole (PRILOSEC) 20 MG capsule Take 20 mg by mouth daily before breakfast.   potassium chloride (K-DUR) 10 MEQ tablet Take 1-2 tablets (10-20 mEq total) by mouth as directed. Alternating every other day. Take 1 tablet (10 mEq) daily and 2 tablets (20 mEq) daily   Sennosides (EX-LAX) 15 MG CHEW Chew 15 mg by mouth 2 (two) times daily as needed (constipation).   sildenafil (REVATIO) 20 MG tablet TAKE ONE TABLET BY MOUTH THREE TIMES A DAY   tamsulosin (FLOMAX) 0.4 MG CAPS capsule TAKE 1  CAPSULE AT BEDTIME.   torsemide (DEMADEX) 20 MG tablet Take 1-2 tablets (20-40 mg total) by mouth as directed. Alternate every other day. 1 tablet (20 mg) daily and 2 tablets (40 mg) daily   No facility-administered encounter medications on file as of 10/27/2020.   Recent Office Vitals: BP Readings from Last 3 Encounters:  10/06/20 140/80  09/06/20 (!) 160/70  08/19/20 (!) 157/63   Pulse Readings from Last 3 Encounters:  10/06/20 66  09/06/20 60  08/19/20 62    Wt Readings from Last 3 Encounters:  10/06/20 271 lb 9.6 oz (123.2 kg)  09/06/20 268 lb (121.6 kg)  08/19/20 266 lb 12.1 oz (121 kg)     Kidney Function Lab Results  Component Value Date/Time   CREATININE 2.55 (H) 08/12/2020 11:20 AM   CREATININE 2.15 (H) 07/01/2019 11:19 AM   CREATININE 1.79 (H) 07/21/2013 10:07 AM   GFR 30.42 (L) 07/01/2019 11:19 AM   GFRNONAA 24 (L) 08/12/2020 11:20 AM   GFRNONAA 39 (L) 07/21/2013 10:07 AM   GFRAA 28 (L) 08/12/2020 11:20 AM   GFRAA 45 (L) 07/21/2013 10:07 AM    BMP Latest Ref Rng & Units 08/12/2020 07/01/2019 12/23/2018  Glucose 65 - 99 mg/dL 31(LL) 81 93  BUN 8 - 27 mg/dL 28(H) 28(H) 25(H)  Creatinine 0.76 - 1.27 mg/dL 2.55(H) 2.15(H) 1.82(H)  BUN/Creat Ratio 10 - 24 11 - -  Sodium 134 - 144 mmol/L 148(H) 142 141  Potassium 3.5 - 5.2 mmol/L 4.1 3.9 4.2  Chloride 96 - 106 mmol/L 112(H) 109 108  CO2 20 - 29 mmol/L 24 28 27   Calcium 8.6 - 10.2 mg/dL 8.6 8.5 8.4   Multiple attempts to reach out to the patient 10/26/20, 10/27/20, and 10/28/20. No voice mailbox available.  Unsuccessful outreach. Chart reviewed for diabetes and blood pressure adherence.   Hypertension  Current antihypertensive regimen:  Carvedilol 12.5 mg - 1 tablet twice daily with meals Clonidine 0.3 mg - 1 tablet three times daily Hydralazine 50mg  - 1 tablet three times daily  What recent interventions/DTPs have been made by any provider to improve Blood Pressure control since last CPP Visit:  Dr. Rockey Situ  started patient on hydralazine 50 mg TID   Any recent hospitalizations or ED visits since last visit with CPP? Yes   Admitted to the hospital on 08/19/2020 due to  Angina . Discharge date was 08/19/2020 [facility].    Adherence Review: Is the patient currently on ACE/ARB medication? No  Star Rating Drugs:  Medication:  Last Fill: Day Supply Atorvastatin 80mg . 08/29/2020 90ds  Recent Relevant Labs: Lab Results  Component Value Date/Time   HGBA1C 6.9 (H) 07/12/2020 09:34 AM   HGBA1C 7.4 (H) 03/16/2020 09:56 AM   MICROALBUR 221.2 Repeated and verified X2. (H) 09/15/2013 10:09 AM    Kidney Function Lab Results  Component Value Date/Time   CREATININE 2.55 (H) 08/12/2020 11:20 AM   CREATININE 2.15 (H) 07/01/2019  11:19 AM   CREATININE 1.79 (H) 07/21/2013 10:07 AM   GFR 30.42 (L) 07/01/2019 11:19 AM   GFRNONAA 24 (L) 08/12/2020 11:20 AM   GFRNONAA 39 (L) 07/21/2013 10:07 AM   GFRAA 28 (L) 08/12/2020 11:20 AM   GFRAA 45 (L) 07/21/2013 10:07 AM   Diabetes  Current antihyperglycemic regimen:  Insulin 70/30 (NPH/regular) - Inject 70 units before breakfast and 40-50 units before evening meal   What recent interventions/DTPs have been made to improve glycemic control: Patient encouraged to keep a BG log  Adherence Review: Is the patient currently on a STATIN medication? Yes Does the patient have >5 day gap between last estimated fill dates? No gaps in adherence  Star Rating Drugs:  Medication:  Last Fill: Day Supply Atorvastatin 80 mg 08/29/2020 90ds  Follow-Up:  Pharmacist Review  Debbora Dus, CPP notified  Avel Sensor [place] Assistant [phone]  I have reviewed the care management and care coordination activities outlined in this encounter and I am certifying that I agree with the content of this note. Second unsuccessful outreach.  Debbora Dus, PharmD Clinical Pharmacist [facility] at [facility] [phone]

## 2020-10-29 ENCOUNTER — Ambulatory Visit: Payer: Medicare HMO | Admitting: Family Medicine

## 2020-10-29 ENCOUNTER — Ambulatory Visit: Payer: Medicare HMO | Admitting: Physician Assistant

## 2020-10-29 ENCOUNTER — Other Ambulatory Visit: Payer: Self-pay

## 2020-10-29 ENCOUNTER — Encounter: Payer: Self-pay | Admitting: Physician Assistant

## 2020-10-29 ENCOUNTER — Other Ambulatory Visit
Admission: RE | Admit: 2020-10-29 | Discharge: 2020-10-29 | Disposition: A | Discharge status: Home / Self Care | Payer: Medicare HMO | Attending: Physician Assistant | Admitting: Physician Assistant

## 2020-10-29 VITALS — BP 130/62 | HR 55 | Ht 65.0 in | Wt 269.0 lb

## 2020-10-29 DIAGNOSIS — Z794 Long term (current) use of insulin: Secondary | ICD-10-CM

## 2020-10-29 DIAGNOSIS — R0600 Dyspnea, unspecified: Secondary | ICD-10-CM

## 2020-10-29 DIAGNOSIS — R0609 Other forms of dyspnea: Secondary | ICD-10-CM

## 2020-10-29 DIAGNOSIS — I10 Essential (primary) hypertension: Secondary | ICD-10-CM

## 2020-10-29 DIAGNOSIS — J8410 Pulmonary fibrosis, unspecified: Secondary | ICD-10-CM

## 2020-10-29 DIAGNOSIS — I5032 Chronic diastolic (congestive) heart failure: Secondary | ICD-10-CM

## 2020-10-29 DIAGNOSIS — E039 Hypothyroidism, unspecified: Secondary | ICD-10-CM

## 2020-10-29 DIAGNOSIS — I2510 Atherosclerotic heart disease of native coronary artery without angina pectoris: Secondary | ICD-10-CM | POA: Diagnosis not present

## 2020-10-29 DIAGNOSIS — I270 Primary pulmonary hypertension: Secondary | ICD-10-CM

## 2020-10-29 DIAGNOSIS — I701 Atherosclerosis of renal artery: Secondary | ICD-10-CM

## 2020-10-29 DIAGNOSIS — N183 Chronic kidney disease, stage 3 unspecified: Secondary | ICD-10-CM | POA: Diagnosis not present

## 2020-10-29 DIAGNOSIS — Z0289 Encounter for other administrative examinations: Secondary | ICD-10-CM

## 2020-10-29 DIAGNOSIS — E1121 Type 2 diabetes mellitus with diabetic nephropathy: Secondary | ICD-10-CM

## 2020-10-29 LAB — BASIC METABOLIC PANEL
Anion gap: 7 (ref 5–15)
BUN: 34 mg/dL — ABNORMAL HIGH (ref 8–23)
CO2: 25 mmol/L (ref 22–32)
Calcium: 8.1 mg/dL — ABNORMAL LOW (ref 8.9–10.3)
Chloride: 109 mmol/L (ref 98–111)
Creatinine, Ser: 2.56 mg/dL — ABNORMAL HIGH (ref 0.61–1.24)
GFR, Estimated: 26 mL/min — ABNORMAL LOW (ref >60)
Glucose, Bld: 79 mg/dL (ref 70–99)
Potassium: 3.7 mmol/L (ref 3.5–5.1)
Sodium: 141 mmol/L (ref 135–145)

## 2020-10-29 LAB — BRAIN NATRIURETIC PEPTIDE: B Natriuretic Peptide: 217.1 pg/mL — ABNORMAL HIGH (ref 0.0–100.0)

## 2020-10-29 NOTE — Patient Instructions (Addendum)
Medication Instructions:  Your physician recommends that you continue on your current medications as directed. Please refer to the Current Medication list given to you today.  *If you need a refill on your cardiac medications before your next appointment, please call your pharmacy*   Lab Work: Your physician recommends that you have lab work TODAY at the [facility]: BMET, BNP -  Please go to the [facility]. You will check in at the front desk to the right as you walk into the atrium.   If you have labs (blood work) drawn today and your tests are completely normal, you will receive your results only by: Marland Kitchen MyChart Message (if you have MyChart) OR . A paper copy in the mail If you have any lab test that is abnormal or we need to change your treatment, we will call you to review the results.   Testing/Procedures: None ordered   Follow-Up: At [facility], you and your health needs are our priority.  As part of our continuing mission to provide you with exceptional heart care, we have created designated Provider Care Teams.  These Care Teams include your primary Cardiologist (physician) and Advanced Practice Providers (APPs -  Physician Assistants and Nurse Practitioners) who all work together to provide you with the care you need, when you need it.  We recommend signing up for the patient portal called "MyChart".  Sign up information is provided on this After Visit Summary.  MyChart is used to connect with patients for Virtual Visits (Telemedicine).  Patients are able to view lab/test results, encounter notes, upcoming appointments, etc.  Non-urgent messages can be sent to your provider as well.   To learn more about what you can do with MyChart, go to [email or web address].    Your next appointment:    Follow up with Dr. Rockey Situ as scheduled.   The format for your next appointment:   In Person  Provider:   You may see Ida Rogue, MD or one of the following Advanced  Practice Providers on your designated Care Team:    Other Instructions  We recommend a maximum of 2g sodium per day and 2L total fluid per day. Fluids include coffee, tea, water, and juice.  In addition, we recommend you monitor both your daily weight and daily BP at the same time each day - bring this long into the office.

## 2020-10-29 NOTE — Progress Notes (Signed)
Office Visit    Patient Name: Stephen [name] Date of Encounter: 10/29/2020  PCP:  [name], MD   [place]  Cardiologist:  Ida Rogue, MD  Advanced Practice Provider:  No care team member to display Electrophysiologist:  None   Chief Complaint    Chief Complaint  Patient presents with  . OTHER    Per patient and wife, they would like to discuss his medications because he is getting different information from providers. Medications verbally reviewed with patient    75 y.o. male with history of pulmonary hypertension, chronic diastolic CHF, CAD, pulmonary fibrosis s/p wedge resection, obesity, COPD on home oxygen, OSA on CPAP, DM2, CKD, and who presents today to discuss medications.  Past Medical History    Past Medical History:  Diagnosis Date  . Adrenal gland anomaly   . Arthritis   . CAD (coronary artery disease)    a. 04/2006 MI and PCI/stenting to mLCx & mRCA; b. 07/2009 Cath: patent LCX/RCA stents;  c. 12/2016 NSTEMI/Cath: LM 30, LAD 30p, 20m 30d, D1/2/3 min irregs, LCX 1040mSR, OM1 min irregs, RCA 20p, 10 ISR, 708m0d, RPDA min irregs, RPAV 40-->Med Rx; d. 12/2017 MV: fixed lateral wall scar, mild anterior/anterior septal ischemia. EF 30-44%.  . [facility] [place]ive [facility] [place]) [place] [place] . CKD (chronic kidney disease), stage III ([facility] [place] . Colon polyps   . DM2 (diabetes mellitus, type 2) (HCC)    insulin requiring  . Dyslipidemia   . GERD (gastroesophageal reflux disease)   . History of echocardiogram    a. TTE 12/2016: EF 50-55%, mod concentric LVH, images inadequate for wall motion assessment, not technically sufficienct to allow for LV dias fxn, calcified mitral annulus   . HTN (hypertension)   . Kidney stones   . Left Renal artery stenosis ([facility]  a. 07/2013 s/p PTA/stenting ([place]); b. 07/2017 Renal Duplex: No significant RAS.  . MMarland Kitchenocardial infarction ([facility]CC[place] . OSA (obstructive sleep apnea)   . Pulmonary fibrosis ([place]  a.  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  . Recurrent UTI   . Rotator cuff disorder    right  . Skin cancer    head  . Thyroid disorder   . Venous insufficiency of both lower extremities    a. s/p laser treatment.   Past Surgical History:  Procedure Laterality Date  . adrenal adenoma removal  1990-   right  . CARPAL TUNNEL RELEASE     bilateral  . CATARACT EXTRACTION W/PHACO Right 07/08/2018   Procedure: CATARACT EXTRACTION PHACO AND INTRAOCULAR LENS PLACEMENT (IOC) RIGHT DIABETIC;  Surgeon: KinEulogio BearD;  [place] [facility];  Laterality: Right;  Diabetic - insulin sleep apnea  . CATARACT EXTRACTION W/PHACO Left 08/05/2018   Procedure: CATARACT EXTRACTION PHACO AND INTRAOCULAR LENS PLACEMENT (IOCPortlandEFT DIABETIC;  Surgeon: KinEulogio BearD;  [place] [facility];  Laterality: Left;  DIABETIC  . CORONARY ANGIOGRAPHY N/A 08/19/2020   Procedure: CORONARY ANGIOGRAPHY;  Surgeon: GolMinna MerrittsD;  [place];  Service: Cardiovascular;  Laterality: N/A;  . CORONARY ANGIOPLASTY WITH STENT PLACEMENT  2007   x 2  . KNEE SURGERY     right  . LEFT HEART CATH AND CORONARY ANGIOGRAPHY N/A 01/11/2017   Procedure: Left Heart Cath and Coronary Angiography;  Surgeon: AriWellington HampshireD;  Location:  [place] [facility];  Service: Cardiovascular;  Laterality: N/A;  . LUNG BIOPSY    . RENAL ARTERY STENT  2015   L  . SKIN CANCER EXCISION     back of head    Allergies  Allergies  Allergen Reactions  . Calcium Channel Blockers     Would avoid if possible due to h/o peripheral edema    History of Present Illness    Stephen [name] is a 75 y.o. male with PMH as above.  He has history of pulmonary hypertension, chronic diastolic heart failure, CAD, pulmonary fibrosis s/p wedge resection, obesity, COPD on home oxygen, OSA on CPAP, DM2, and  CKD.  Medical management and intervention has been limited by renal function in the past.  History of CAD s/p PCI to mid RCA and mid left circumflex 04/2006.  Catheterization 12/2016 with elevated LVEDP.  MPI 12/2017 with old region of infarct, possible region of ischemia there is significant attenuation defect.  Catheterization deferred, given he was asymptomatic and with consideration of renal function.  Echo 09/2018 with EF 45 to 50%, moderately elevated right heart pressures with RVSP 50 mmHg.  He does have a history of interstitial fibrosis on chronic oxygen s/p wedge resection.  He had open lung biopsy 05/2008 for bilateral pulmonary infiltrates.  He has a history of renal artery stenosis and underwent left renal artery stent in early 2020 for 80+ percent stenosis.  He also reportedly has undergone laser treatment of lower extremity veins for leg swelling and venous insufficiency.  He was seen by primary care 07/2020 with increasing lower extremity edema and dyspnea.  Recommendation was for torsemide 2 tablets daily for 5 days.  He spoke to his PCP 08/04/2020 and reported oxygen level dropping with ambulation despite 4 days of increased diuretics.  08/06/2020 echo with LVEF 55 to 60%, G1 DD, moderate LAE.  Images were noted as challenging.  Seen 08/12/2020 for follow-up after his echo.  He reported his oxygen level dropping with minimal movement.  He reported 85 to 86% oxygen levels.  This was associated with chest tightness.  He had increased his home oxygen to 5 L with some improvement.  He reported being on his increased oxygen for about 1 month.  He reported nonproductive/dry cough that resolved with cough drop.  He had some feelings of lightheadedness when taking the increased dose of torsemide.  He did note that his leg swelling improved.  He denied any change in dyspnea or oxygen saturation with increased dose of torsemide.  His wife noted that the patient has been overdosed with fluid pills before  and thus patient/wife understandably hesitant regarding increased dose diuretic.  Given his increased dyspnea on exertion and chest tightness, recommendation was for right and left heart catheterization for assessment of worsening CAD versus right heart pressures.  He was continued on a schedule of alternate torsemide 20 mg and 40 mg every other day.  It was noted that he could take an additional 20 mg of torsemide if weight gain 2 pounds overnight or 5 pounds in a week.  Follow-up with Dr. Patsey Berthold of pulmonology was recommended, as well as possible referral to the advanced heart failure clinic in the future.  Ongoing follow-up with [facility]/Dr. Holley Raring recommended.  On 08/19/2020, he underwent left heart catheterization with coronary angiography.  It was noted that access was challenging, given morbid obesity.  Severe left circumflex disease noted with 100% occluded mid vessel and collaterals, though this was known from  prior catheterizations.  Moderate to severe mid to distal RCA disease noted and unchanged from previous catheterizations.  Moderate proximal and mid LAD disease also noted. Recommendation was for medical management.  If right heart catheterization needed in the future, access would need changed.  It was noted neck would not be an appropriate choice, given the thickness of his neck.  Markedly elevated blood pressure was noted during the case.  Aggressive BP control recommended in the future.  It was thought that this uncontrolled BP could be contributing to his shortness of breath, as well as his morbid obesity.  08/27/2020 PFTs completed with moderate restrictive lung disease without significant bronchodilator response.  Today, 10/29/2020, he returns to clinic and notes that he has not been feeling well since his catheterization.  He reports feeling as if he has a cold lately.  He has continued to note oxygen saturations that drop significantly with minimal exertion.  For example,  he notes an oxygen saturation of 74% with ambulation.  He has increased his home oxygen to 6 L daily, requiring his oxygen to be switched out prior to today's visit, as he had run out of his current tank.  He is trying to avoid salt and restrict fluids.  He is eating more fresh vegetables and frozen vegetables.  He reports chest pain with low oxygen saturation, described as more of a chest tightness.  He might also feel dizzy with lower oxygen saturations.  He also reports chronic lower extremity edema.  No reported signs or symptoms of bleeding.  He has recovered well from the catheterization itself.  Catheterization and echo report reviewed in great detail today.  Home Medications    Current Outpatient Medications on File Prior to Visit  Medication Sig Dispense Refill  . ACCU-CHEK SOFTCLIX LANCETS lancets Use as instructed to check blood sugar three times daily and as needed.  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 100 each 5  . acetaminophen (TYLENOL) 325 MG tablet Take 2 tablets (650 mg total) by mouth every 6 (six) hours as needed for mild pain (or Fever >/= 101).    Marland Kitchen albuterol (VENTOLIN HFA) 108 (90 Base) MCG/ACT inhaler Inhale 2 puffs into the lungs every 6 (six) hours as needed for wheezing or shortness of breath. Dispense 3 inhalers. 48 g 3  . Alcohol Swabs (B-D SINGLE USE SWABS REGULAR) PADS USE TO CLEANS AREA PRIOR TO CHECKING BLOOD SUGAR THREE TIMES DAILY AND AS NEEDED 400 each 3  . aspirin EC 81 MG tablet Take 324 mg by mouth daily. Swallow whole.    Marland Kitchen atorvastatin (LIPITOR) 80 MG tablet TAKE 1 TABLET AT BEDTIME 90 tablet 1  . Blood Glucose Monitoring Suppl (ACCU-CHEK AVIVA PLUS) w/Device KIT Use to check blood sugar three times daily and as needed.  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 carvedilol (COREG) 12.5 MG tablet TAKE 1 TABLET TWO TIMES DAILY WITH MEALS 180 tablet 1  . cloNIDine (CATAPRES) 0.3 MG tablet TAKE 1 TABLET THREE TIMES DAILY 270 tablet 0  . clopidogrel (PLAVIX)  75 MG tablet TAKE 1 TABLET EVERY DAY 90 tablet 2  . diphenhydrAMINE (BENADRYL) 50 MG tablet Take 50 mg by mouth at bedtime as needed for allergies.    Marland Kitchen dorzolamide-timolol (COSOPT) 22.3-6.8 MG/ML ophthalmic solution Place 1 drop into both eyes in the morning and at bedtime.    Marland Kitchen ezetimibe (ZETIA) 10 MG tablet TAKE 1 TABLET EVERY DAY 90 tablet 2  . Fluticasone-Umeclidin-Vilant (TRELEGY ELLIPTA) 200-62.5-25 MCG/INH AEPB Inhale 1  puff into the lungs daily. 1 each 11  . gabapentin (NEURONTIN) 400 MG capsule Take 2 tabs in the AM and 3 at night. 450 capsule 3  . glucose blood (ACCU-CHEK AVIVA PLUS) test strip USE AS INSTRUCTED TO CHECK BLOOD SUGAR 3 TIMES DAILY OR AS NEEDED 300 strip 1  . hydrALAZINE (APRESOLINE) 50 MG tablet Take 1 tablet (50 mg total) by mouth 3 (three) times daily. 90 tablet 6  . HYDROcodone-acetaminophen (NORCO/VICODIN) 5-325 MG tablet Take 1 tablet by mouth every 6 (six) hours as needed for moderate pain. 20 tablet 0  . insulin NPH-regular Human (NOVOLIN 70/30) (70-30) 100 UNIT/ML injection Inject 40-60 Units into the skin See admin instructions. Inject 60 units subcutaneously in the morning  & inject 40 units subcutaneously at bedtime.    . Insulin Syringe-Needle U-100 (INSULIN SYRINGE 1CC/30GX5/16") 30G X 5/16" 1 ML MISC Use as directed to take insulin Dx E11.9 180 each 3  . isosorbide mononitrate (IMDUR) 60 MG 24 hr tablet TAKE 1 AND 1/2 TABLETS TWICE DAILY 270 tablet 0  . levothyroxine (SYNTHROID) 25 MCG tablet TAKE 1 TABLET BY MOUTH ONCE DAILY IN THE MORNING BEFORE BREAKFAST EXCEPT TAKE 2 TABLETS BY MOUTH DAILY ON MON. - WED. - FRI. TOTAL OF 10 PILLS PER WEEK. 120 tablet 1  . montelukast (SINGULAIR) 10 MG tablet Take 1 tablet (10 mg total) by mouth daily. 30 tablet 2  . nitroGLYCERIN (NITROSTAT) 0.4 MG SL tablet Place 1 tablet (0.4 mg total) under the tongue every 5 (five) minutes as needed. May repeat x3 25 tablet prn  . omeprazole (PRILOSEC) 20 MG capsule Take 20 mg by mouth  daily before breakfast.    . potassium chloride (K-DUR) 10 MEQ tablet Take 1-2 tablets (10-20 mEq total) by mouth as directed. Alternating every other day. Take 1 tablet (10 mEq) daily and 2 tablets (20 mEq) daily 180 tablet 3  . Sennosides (EX-LAX) 15 MG CHEW Chew 15 mg by mouth 2 (two) times daily as needed (constipation).    . sildenafil (REVATIO) 20 MG tablet TAKE ONE TABLET BY MOUTH THREE TIMES A DAY 270 tablet 0  . tamsulosin (FLOMAX) 0.4 MG CAPS capsule TAKE 1 CAPSULE AT BEDTIME. 90 capsule 1  . torsemide (DEMADEX) 20 MG tablet Take 1-2 tablets (20-40 mg total) by mouth as directed. Alternate every other day. 1 tablet (20 mg) daily and 2 tablets (40 mg) daily 180 tablet 3   No current facility-administered medications on file prior to visit.    Review of Systems    He reports ongoing dyspnea/shortness of breath with oxygen saturations dropping to 74% on 6 L of oxygen at home.  He reports chest tightness and presyncope with low oxygen saturations.  No falls or syncopal episodes.  He has ongoing edema.  He denies palpitations, n, v, syncope, weight gain, or early satiety.  All other systems reviewed and are otherwise negative except as noted above.  Physical Exam    VS:  Ht _0  (1.651 m)   Wt 269 lb (122 kg)   SpO2 90% Comment: 3 Liters of Oxgen  BMI 44.76 kg/m  , BMI Body mass index is 44.76 kg/m. GEN: Obese male, in no acute distress. HEENT: normal. Neck: Supple. No carotid bruits, or masses.  To assess JVD due to body habitus. Cardiac: bradycardic but regular, no murmurs, rubs, or gallops. No clubbing, cyanosis.2+ bilateral lower extremity edema.  Radials/DP/PT 2+ and equal bilaterally.  Respiratory: Distant breath sounds.  Respirations regular and unlabored,  clear to auscultation bilaterally. GI: Firm, nontender, slightly distended, BS + x 4. MS: no deformity or atrophy. Skin: warm and dry, no rash. Neuro:  Strength and sensation are intact. Psych: Normal  affect.  Accessory Clinical Findings    ECG personally reviewed by me today -sinus bradycardia, 55 bpm, LVH with widening QRS,- no acute changes.  VITALS Reviewed today   Temp Readings from Last 3 Encounters:  10/06/20 98.7 F (37.1 C) (Temporal)  08/19/20 97.8 F (36.6 C) (Oral)  07/26/20 98 F (36.7 C) (Temporal)   BP Readings from Last 3 Encounters:  10/06/20 140/80  09/06/20 (!) 160/70  08/19/20 (!) 157/63   Pulse Readings from Last 3 Encounters:  10/06/20 66  09/06/20 60  08/19/20 62    Wt Readings from Last 3 Encounters:  10/29/20 269 lb (122 kg)  10/06/20 271 lb 9.6 oz (123.2 kg)  09/06/20 268 lb (121.6 kg)     LABS  reviewed today   Lab Results  Component Value Date   WBC 7.4 08/12/2020   HGB 13.5 08/12/2020   HCT 40.8 08/12/2020   MCV 87 08/12/2020   PLT 195 08/12/2020   Lab Results  Component Value Date   CREATININE 2.55 (H) 08/12/2020   BUN 28 (H) 08/12/2020   NA 148 (H) 08/12/2020   K 4.1 08/12/2020   CL 112 (H) 08/12/2020   CO2 24 08/12/2020   Lab Results  Component Value Date   ALT 13 12/23/2018   AST 13 12/23/2018   ALKPHOS 86 12/23/2018   BILITOT 0.5 12/23/2018   Lab Results  Component Value Date   CHOL 125 12/23/2018   HDL 27.70 (L) 12/23/2018   LDLCALC 69 12/23/2018   TRIG 142.0 12/23/2018   CHOLHDL 5 12/23/2018    Lab Results  Component Value Date   HGBA1C 6.9 (H) 07/12/2020   Lab Results  Component Value Date   TSH 7.42 (H) 07/12/2020     STUDIES/PROCEDURES reviewed today   Coronary angiography 08/19/20 Coronary Diagrams   Diagnostic Dominance: Right    Coronary angiography:  Coronary dominance: Right  Left mainstem:   Large vessel that bifurcates into the LAD and left circumflex  Left anterior descending (LAD):   Large vessel that extends to the apical region, diagonal branch 2 of moderate size, moderate proximal and mid LAD disease.  Moderate ostial diagonal disease D1-D2  Left circumflex (LCx):  Large  vessel with OM branch 2, prior stent mid vessel, occluded at this location, collaterals from left to left, right to left  Right coronary artery (RCA):  Right dominant vessel with PL and PDA, moderate to severe mid vessel disease 70%, 50% distal disease  Left ventriculography: Crossed for pressures, LV gram not performed  Final Conclusions:   Challenging access  given morbid obesity, weight 270 pounds Severe left circumflex disease occluded mid vessel, known from prior catheterization, collaterals Moderate to severe mid to distal RCA disease, unchanged from prior catheterization Moderate proximal and mid LAD disease Difficult groin access given morbid obesity, arterial accessed easily, Ultrasound used to visualize the vein, not well visualized apart from distal vessel but was too proximal to the artery to engage Grossly vein did not appear markedly dilated.  Recommendations:  Medical management recommended If right heart catheterization felt needed, would need to change access.  Neck not a appropriate choice given thick neck, obesity --Markedly elevated blood pressure during the case Recommend aggressive blood pressure control, this is likely contributing to his shortness of breath -Also with  morbid obesity, weight continues to trend upwards, recommend strict low carbohydrate diet Goal weight 200   LM lesion is 30% stenosed.  Prox LAD lesion is 40% stenosed.  Mid LAD lesion is 50% stenosed.  Dist LAD lesion is 30% stenosed.  Mid Cx lesion is 100% stenosed.  Prox RCA lesion is 20% stenosed.  Dist RCA lesion is 50% stenosed.  Mid RCA-1 lesion is 10% stenosed.  Mid RCA-2 lesion is 70% stenosed.  RPAV lesion is 40% stenosed.  2nd Diag lesion is 50% stenosed.  1st Diag lesion is 45% stenosed.     Echo 08/06/20 1. Left ventricular ejection fraction, by estimation, is 55 to 60%. The  left ventricle has normal function. The left ventricle has no regional  wall motion  abnormalities. Left ventricular diastolic parameters are  consistent with Grade I diastolic  dysfunction (impaired relaxation).  2. Right ventricular systolic function is normal. The right ventricular  size is normal.  3. Left atrial size was moderately dilated.  4. Challenging images   US renal artery 07/2020 Summary:  Renal:  Right: Normal size right kidney. 1-59% stenosis of the right renal     artery. Unable to visualize the Aorta and segments of the     Right Renal Artery.  Left: Normal size of left kidney. Technically difficult due to     bowel Gas; unable to visualize the Renal Artery.     Assessment & Plan    Chronic diastolic heart failure Pulmonary hypertension -- Reports progressive dyspnea with comorbid conditions as described above. Most recent cath as above with pulmonary HTN 2/2 ILD as previously noted.  Femoral vein unable to be accessed during cath. Currently on 6 L of nasal cannula oxygen, increased from 08/12/2020 visit. He continues to follow with pulmonary with recent PFTs above.  We will recheck a BMET and BNP today and continue current alternating torsemide 78m/20mg diuretic regimen with updates to be provided pending labs. Discussed that diuresis complicated by renal function. Continue carvedilol.  Continue to follow-up with pulmonology and [facility].  As previously noted, future considerations could include [place] also been recommended in the past on review of EMR / primary cardiologist notes as well.  Consider that BMI and deconditioning likely contributing as well to his sx as above.   Coronary artery disease -- Reports progressive dyspnea with recent catheterization performed as above and showing complete occlusion of left circumflex and nonobstructive disease as detailed above.  Continue ASA, Plavix, carvedilol, atorvastatin, Zetia, and Imdur.  Aggressive risk factor modification  recommended. Cardiac rehab referral recommended if able to obtain to assist with increased activity given likely that BMI and deconditioning contributing to sx s previously mentioned.  HTN, goal BP less than 130/80 -- BP today 130/62 with SBP borderline.  High BP noted during catheterization. He is tolerating hydralazine, added at his last visit. Encouraged ongoing home BP checks.  If SBP still elevated at RTC, consider further titration of antihypertensives for optimize BP control.  CKD 3 -- [facility] to follow with [facility].  Careful monitoring of renal function. Caution with changes to diuretic regimen.   DM2 -- Glycemic control recommended per PCP and for cardiovascular and renal risk factor modification.  Hypothyroidism -- Continue treatment and monitoring per PCP.  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  Reports increasing home O2 to 6L daily. Most recent PFTs as above.  Medication changes: None Labs  ordered: BMET, BNP Studies / Imaging ordered: None Future considerations: [place] HF clinic, cardiac rehab Disposition: RTC as scheduled with Dr. Rockey Situ  *Please be aware that the above documentation was completed voice recognition software and may contain dictation errors.      Arvil Chaco, PA-C 10/29/2020

## 2020-10-30 ENCOUNTER — Other Ambulatory Visit: Payer: Self-pay | Admitting: Family Medicine

## 2020-11-01 ENCOUNTER — Telehealth: Payer: Self-pay | Admitting: Physician Assistant

## 2020-11-01 NOTE — Telephone Encounter (Signed)
Spoke to pt, notified renal function stable from previous, however, we are waiting for recc from Dr. Rockey Situ. Notified pt we will contact to further advise. Pt verbalized understanding.   See result note below:  Arvil Chaco, PA-C  Solmon Ice, RN Cc: Minna Merritts, MD Renal function stable from previous labs.   Will forward to primary cardiologist, as his previous notes indicate a preference for BP control and that diuresis adjustment should be avoided if possible due to renal function.   Dr. Rockey Situ, I saw this pt 10/29/20. He reports increasing SOB/DOE and his oxygen to 6L daily. BNP elevated with consideration of BMI. I held off on any adjustment to diuresis until able to speak with you. I reviewed your cath report and did note that [facility] not preformed due to access issues so wanted to touch base regarding any recommendations at this point.

## 2020-11-01 NOTE — Telephone Encounter (Signed)
Patient calling in to lab results after visit on 4/29

## 2020-11-03 ENCOUNTER — Telehealth: Payer: Self-pay

## 2020-11-03 DIAGNOSIS — R0902 Hypoxemia: Secondary | ICD-10-CM

## 2020-11-03 NOTE — Telephone Encounter (Signed)
6L O2 per recent notes.  Thanks.

## 2020-11-03 NOTE — Telephone Encounter (Signed)
Lorie from [employer] called and stated that she is needing clarification on home many liters of oxygen the patient is on. Please advise.

## 2020-11-03 NOTE — Telephone Encounter (Signed)
Please send an order for 6 L via nasal cannula.  Thanks. Diagnosis hypoxia, R09.02.

## 2020-11-03 NOTE — Telephone Encounter (Signed)
Spoke with Lorie at [employer] [facility] and advised patient is on 6 L of O2 per recent OV notes. Per their records patient is still on 2 L of O2 so a new order needs to be done so they can send out his new equipment to him. Order needs to be faxed to [phone] once done.

## 2020-11-04 NOTE — Addendum Note (Signed)
Addended by: Sherrilee Gilles B on: 11/04/2020 04:57 PM   Modules accepted: Orders

## 2020-11-04 NOTE — Telephone Encounter (Signed)
DME order done and faxed to number provided

## 2020-11-10 ENCOUNTER — Other Ambulatory Visit: Payer: Self-pay | Admitting: Cardiovascular Disease

## 2020-11-11 ENCOUNTER — Other Ambulatory Visit: Payer: Self-pay

## 2020-11-11 ENCOUNTER — Ambulatory Visit: Payer: Medicare HMO | Admitting: Cardiovascular Disease

## 2020-11-11 ENCOUNTER — Encounter: Payer: Self-pay | Admitting: Cardiovascular Disease

## 2020-11-11 VITALS — BP 138/60 | HR 61 | Ht 65.0 in | Wt 264.5 lb

## 2020-11-11 DIAGNOSIS — I701 Atherosclerosis of renal artery: Secondary | ICD-10-CM

## 2020-11-11 DIAGNOSIS — I10 Essential (primary) hypertension: Secondary | ICD-10-CM

## 2020-11-11 DIAGNOSIS — J8410 Pulmonary fibrosis, unspecified: Secondary | ICD-10-CM

## 2020-11-11 DIAGNOSIS — R0600 Dyspnea, unspecified: Secondary | ICD-10-CM | POA: Diagnosis not present

## 2020-11-11 DIAGNOSIS — N183 Chronic kidney disease, stage 3 unspecified: Secondary | ICD-10-CM

## 2020-11-11 DIAGNOSIS — I2510 Atherosclerotic heart disease of native coronary artery without angina pectoris: Secondary | ICD-10-CM

## 2020-11-11 DIAGNOSIS — R0609 Other forms of dyspnea: Secondary | ICD-10-CM

## 2020-11-11 DIAGNOSIS — Z01810 Encounter for preprocedural cardiovascular examination: Secondary | ICD-10-CM | POA: Diagnosis not present

## 2020-11-11 DIAGNOSIS — I5032 Chronic diastolic (congestive) heart failure: Secondary | ICD-10-CM

## 2020-11-11 MED ORDER — SILDENAFIL CITRATE 20 MG PO TABS: 20.0000 mg | ORAL_TABLET | Freq: Three times a day (TID) | ORAL | 3 refills | Status: DC

## 2020-11-11 NOTE — Progress Notes (Signed)
Date:  11/11/2020   ID:  Stephen [name], DOB Nov 05, 1945, MRN [id number]  Patient Location:  [place] [address]   Provider location:   [facility], [place] office  PCP:  [name], MD  Cardiologist:  Arvid Right [facility]  Chief Complaint  Patient presents with  . Other    Edema ankles/legs/ SOB/abn labs. Meds reviewed verbally with pt.    History of Present Illness:    Stephen [name] is a 75 y.o. 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 Status post stent to his left renal artery for 80+ percent stenosis several weeks ago, performed by Dr. Lucky Cowboy. 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 Sedentary baseline, weight continues to run high  Recent CT scan detailing worsening pulmonary fibrosis Details discussed with him on today's visit  Recent lab work reviewed from 2 weeks ago BUN 34 creatinine 2.56 Periodically takes torsemide 40 twice daily, most days 40 in the morning 20 in the afternoon Has chronic lower extremity edema, wears compression hose  Seen by pulmonary in April 2022 Started on Singulair  Cardiac cath : 08/19/2020 results discussed Noted to be hypertensive on that visit Very challenging access given his morbid obesity weight 270 pounds Unable to obtain venous access through the right groin  in terms of  his coronary disease, severe left circumflex disease occluded mid vessel, known from prior catheterization, collaterals Moderate to severe mid to distal RCA disease, unchanged from prior catheterization Moderate proximal and mid LAD disease  With recommended weight loss, cardiac rehab  EKG personally reviewed by myself on todays visit NSR rate 61 bpm, old anterior MI old inferior MI  Other past medical echocardiogram March 2020 ejection fraction 45 to 50% moderately elevated right heart pressures estimated at 50 mmHg  hospitalization March 2020 with acute respiratory distress O2 sat down to 80% on room air with oxygen walking around his house,  Syncope, fell through a wall  upper respiratory infection  short course of prednisone, made his sugars run high   Myoview in July 2019 showed region of old infarct, possible region of ischemia though significant attenuation artifact No catheterization at the time as he was relatively asymptomatic, had renal dysfunction  Cath 01/11/2017, occluded LCX, with collaterals Distal RCA stenosis in-stent 70%, medical management at this time   Past Medical History:  Diagnosis Date  . Adrenal gland anomaly   . Arthritis   . CAD (coronary artery disease)    a. 04/2006 MI and PCI/stenting to mLCx & mRCA; b. 07/2009 Cath: patent LCX/RCA stents;  c. 12/2016 NSTEMI/Cath: LM 30, LAD 30p, 40m, 30d, D1/2/3 min irregs, LCX 143m ISR, OM1 min irregs, RCA 20p, 10 ISR, 20m, 50d, RPDA min irregs, RPAV 40-->Med Rx; d. 12/2017 MV: fixed lateral wall scar, mild anterior/anterior septal ischemia. EF 30-44%.  . CHF (congestive heart failure) ([place])   .  CKD (chronic kidney disease), stage III ([place])   . Colon polyps   . DM2 (diabetes mellitus, type 2) (HCC)    insulin requiring  . Dyslipidemia   . GERD (gastroesophageal reflux disease)   . History of echocardiogram    a. TTE 12/2016: EF 50-55%, mod concentric LVH, images inadequate for wall motion assessment, not  technically sufficienct to allow for LV dias fxn, calcified mitral annulus   . HTN (hypertension)   . Kidney stones   . Left Renal artery stenosis ([place])    a. 07/2013 s/p PTA/stenting ([place]); b. 07/2017 Renal Duplex: No significant RAS.  Marland Kitchen Myocardial infarction ([place])   . OSA (obstructive sleep apnea)   . Pulmonary fibrosis ([place])    a. 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  . Recurrent UTI   . Rotator cuff disorder    right  . Skin cancer    head  . Thyroid disorder   . Venous insufficiency of both lower extremities    a. s/p laser treatment.   Past Surgical History:  Procedure Laterality Date  . adrenal adenoma removal  1990-   right  . CARPAL TUNNEL RELEASE     bilateral  . CATARACT EXTRACTION W/PHACO Right 07/08/2018   Procedure: CATARACT EXTRACTION PHACO AND INTRAOCULAR LENS PLACEMENT (IOC) RIGHT DIABETIC;  Surgeon: Eulogio Bear, MD;  Location: [facility] [place];  Service: Ophthalmology;  Laterality: Right;  Diabetic - insulin sleep apnea  . CATARACT EXTRACTION W/PHACO Left 08/05/2018   Procedure: CATARACT EXTRACTION PHACO AND INTRAOCULAR LENS PLACEMENT ([place]) LEFT DIABETIC;  Surgeon: Eulogio Bear, MD;  Location: [facility];  Service: Ophthalmology;  Laterality: Left;  DIABETIC  . CORONARY ANGIOGRAPHY N/A 08/19/2020   Procedure: CORONARY ANGIOGRAPHY;  Surgeon: Minna Merritts, MD;  Location: [facility];  Service: Cardiovascular;  Laterality: N/A;  . CORONARY ANGIOPLASTY WITH STENT PLACEMENT  2007   x 2  . KNEE SURGERY     right  . LEFT HEART CATH AND CORONARY ANGIOGRAPHY N/A 01/11/2017   Procedure: Left Heart Cath and Coronary Angiography;  Surgeon: Wellington Hampshire, MD;  Location: [facility];  Service: Cardiovascular;  Laterality: N/A;  . LUNG BIOPSY    . RENAL ARTERY STENT  2015   L  . SKIN CANCER EXCISION     back of head     No outpatient medications have been marked as taking for  the 11/11/20 encounter (Office Visit) with Minna Merritts, MD.     Allergies:   Calcium channel blockers   Social History   Tobacco Use  . Smoking status: Never Smoker  . Smokeless tobacco: Never Used  Vaping Use  . Vaping Use: Never used  Substance Use Topics  . Alcohol use: No    Alcohol/week: 0.0 standard drinks  . Drug use: No     Family Hx: The patient's family history includes Arthritis in his maternal grandmother; Breast cancer in his maternal aunt; COPD in his sister; Diabetes in his maternal aunt; Heart attack in his brother, father, and mother; Hypertension in his maternal aunt and another family member. There is no history of Prostate cancer, Kidney cancer, Bladder Cancer, or Colon cancer.  ROS:   Please see the history of present illness.    Review of Systems  Constitutional: Negative.   HENT: Negative.   Respiratory: Positive for shortness of breath.   Cardiovascular: Positive for leg swelling.  Gastrointestinal: Negative.   Musculoskeletal: Negative.  Neurological: Negative.   Psychiatric/Behavioral: Negative.   All other systems reviewed and are negative.    Labs/Other Tests and Data Reviewed:    Recent Labs: 07/12/2020: TSH 7.42 08/12/2020: Hemoglobin 13.5; Platelets 195 10/29/2020: B Natriuretic Peptide 217.1; BUN 34; Creatinine, Ser 2.56; Potassium 3.7; Sodium 141   Recent Lipid Panel Lab Results  Component Value Date/Time   CHOL 125 12/23/2018 09:17 AM   TRIG 142.0 12/23/2018 09:17 AM   HDL 27.70 (L) 12/23/2018 09:17 AM   CHOLHDL 5 12/23/2018 09:17 AM   LDLCALC 69 12/23/2018 09:17 AM    Wt Readings from Last 3 Encounters:  11/11/20 264 lb 8 oz (120 kg)  10/29/20 269 lb (122 kg)  10/06/20 271 lb 9.6 oz (123.2 kg)     Exam:    Vital Signs: Vital signs may also be detailed in the HPI BP 138/60 (BP Location: Left Arm, Patient Position: Sitting, Cuff Size: Normal)   Pulse 61   Ht 5\' 5"  (1.651 m)   Wt 264 lb 8 oz (120 kg)   SpO2 97%   BMI  44.02 kg/m   Constitutional:  oriented to person, place, and time. No distress.  HENT:  Head: Grossly normal Eyes:  no discharge. No scleral icterus.  Neck: No JVD, no carotid bruits  Cardiovascular: Regular rate and rhythm, no murmurs appreciated Pulmonary/Chest: Clear to auscultation bilaterally, no wheezes or rails Abdominal: Soft.  no distension.  no tenderness.  Musculoskeletal: Normal range of motion Neurological:  normal muscle tone. Coordination normal. No atrophy Skin: Skin warm and dry Psychiatric: normal affect, pleasant  ASSESSMENT & PLAN:    Chronic respiratory distress/pulmonary hypertension Underlying pulmonary fibrosis, worsening finding on CT scan per radiology Moderately elevated pressures on echo, Unable to access femoral vein right groin on catheterization Continue current torsemide regimen 40 alternating with 20, at the upper limits given renal dysfunction creatinine 2.5 -Recommend reattempt at right heart catheterization, has been set up with Dr. Fletcher Anon.  Large neck, morbid obesity making access difficult May need right brachial -Left heart catheterization already completed -For markedly elevated right heart pressures, could refer to advanced heart failure clinic in [facility] Unclear if he would be a candidate for advanced therapies given fibrosis, morbid obesity -He will hold his sildenafil until after the right heart catheterization, restart after  Morbid obesity ([place]) Contributing to shortness of breath and heart failure symptoms Has been unable to lose weight  Essential hypertension Very high blood pressure during cardiac catheterization,  Several medication changes, improved numbers  Chronic diastolic congestive heart failure (HCC) Creatinine 2.5, relatively unchanged past 2 years Followed by nephrology On torsemide 40 with 20 in the afternoon, sometimes 40 in the afternoon  Coronary artery disease involving native coronary artery of native heart  with angina pectoris (HCC) Recent cardiac catheterization, stable disease A1c at goal, cholesterol at goal  PULMONARY FIBROSIS Followed by pulmonary, CT scan documenting worsening fibrosis On oxygen 2 to 6 L    Total encounter time more than 35 minutes  Greater than 50% was spent in counseling and coordination of care with the patient     Signed, Ida Rogue, MD  11/11/2020 2:48 PM    [place] Office [address] [place] [address], [place], [place] [address]

## 2020-11-11 NOTE — H&P (View-Only) (Signed)
Date:  11/11/2020   ID:  [name], DOB 01-20-1946, MRN [id number]  Patient Location:  [place] [address]   Provider location:   [facility] Captain, [place] office  PCP:  [name], MD  Cardiologist:  Arvid Right [facility]  Chief Complaint  Patient presents with  . Other    Edema ankles/legs/ SOB/abn labs. Meds reviewed verbally with pt.    History of Present Illness:    Stephen [name] is a 75 y.o. 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 Status post stent to his left renal artery for 80+ percent stenosis several weeks ago, performed by Dr. Lucky Cowboy. 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 Sedentary baseline, weight continues to run high  Recent CT scan detailing worsening pulmonary fibrosis Details discussed with him on today's visit  Recent lab work reviewed from 2 weeks ago BUN 34 creatinine 2.56 Periodically takes torsemide 40 twice daily, most days 40 in the morning 20 in the afternoon Has chronic lower extremity edema, wears compression hose  Seen by pulmonary in April 2022 Started on Singulair  Cardiac cath : 08/19/2020 results discussed Noted to be hypertensive on that visit Very challenging access given his morbid obesity weight 270 pounds Unable to obtain venous access through the right groin  in terms of  his coronary disease, severe left circumflex disease occluded mid vessel, known from prior catheterization, collaterals Moderate to severe mid to distal RCA disease, unchanged from prior catheterization Moderate proximal and mid LAD disease  With recommended weight loss, cardiac rehab  EKG personally reviewed by myself on todays visit NSR rate 61 bpm, old anterior MI old inferior MI  Other past medical echocardiogram March 2020 ejection fraction 45 to 50% moderately elevated right heart pressures estimated at 50 mmHg  hospitalization March 2020 with acute respiratory distress O2 sat down to 80% on room air with oxygen walking around his house,  Syncope, fell through a wall  upper respiratory infection  short course of prednisone, made his sugars run high   Myoview in July 2019 showed region of old infarct, possible region of ischemia though significant attenuation artifact No catheterization at the time as he was relatively asymptomatic, had renal dysfunction  Cath 01/11/2017, occluded LCX, with collaterals Distal RCA stenosis in-stent 70%, medical management at this time   Past Medical History:  Diagnosis Date  . Adrenal gland anomaly   . Arthritis   . CAD (coronary artery disease)    a. 04/2006 MI and PCI/stenting to mLCx & mRCA; b. 07/2009 Cath: patent LCX/RCA stents;  c. 12/2016 NSTEMI/Cath: LM 30, LAD 30p, 87m, 30d, D1/2/3 min irregs, LCX 165m ISR, OM1 min irregs, RCA 20p, 10 ISR, 97m, 50d, RPDA min irregs, RPAV 40-->Med Rx; d. 12/2017 MV: fixed lateral wall scar, mild anterior/anterior septal ischemia. EF 30-44%.  . CHF (congestive heart failure) ([place])   .  CKD (chronic kidney disease), stage III ([place])   . Colon polyps   . DM2 (diabetes mellitus, type 2) (HCC)    insulin requiring  . Dyslipidemia   . GERD (gastroesophageal reflux disease)   . History of echocardiogram    a. TTE 12/2016: EF 50-55%, mod concentric LVH, images inadequate for wall motion assessment, not  technically sufficienct to allow for LV dias fxn, calcified mitral annulus   . HTN (hypertension)   . Kidney stones   . Left Renal artery stenosis ([place])    a. 07/2013 s/p PTA/stenting ([place]); b. 07/2017 Renal Duplex: No significant RAS.  Marland Kitchen Myocardial infarction ([place])   . OSA (obstructive sleep apnea)   . Pulmonary fibrosis ([place])    a. 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  . Recurrent UTI   . Rotator cuff disorder    right  . Skin cancer    head  . Thyroid disorder   . Venous insufficiency of both lower extremities    a. s/p laser treatment.   Past Surgical History:  Procedure Laterality Date  . adrenal adenoma removal  1990-   right  . CARPAL TUNNEL RELEASE     bilateral  . CATARACT EXTRACTION W/PHACO Right 07/08/2018   Procedure: CATARACT EXTRACTION PHACO AND INTRAOCULAR LENS PLACEMENT (IOC) RIGHT DIABETIC;  Surgeon: Eulogio Bear, MD;  Location: [facility];  Service: Ophthalmology;  Laterality: Right;  Diabetic - insulin sleep apnea  . CATARACT EXTRACTION W/PHACO Left 08/05/2018   Procedure: CATARACT EXTRACTION PHACO AND INTRAOCULAR LENS PLACEMENT ([place]) LEFT DIABETIC;  Surgeon: Eulogio Bear, MD;  Location: [facility];  Service: Ophthalmology;  Laterality: Left;  DIABETIC  . CORONARY ANGIOGRAPHY N/A 08/19/2020   Procedure: CORONARY ANGIOGRAPHY;  Surgeon: Minna Merritts, MD;  Location: [facility];  Service: Cardiovascular;  Laterality: N/A;  . CORONARY ANGIOPLASTY WITH STENT PLACEMENT  2007   x 2  . KNEE SURGERY     right  . LEFT HEART CATH AND CORONARY ANGIOGRAPHY N/A 01/11/2017   Procedure: Left Heart Cath and Coronary Angiography;  Surgeon: Wellington Hampshire, MD;  Location: [facility];  Service: Cardiovascular;  Laterality: N/A;  . LUNG BIOPSY    . RENAL ARTERY STENT  2015   L  . SKIN CANCER EXCISION     back of head     No outpatient medications have been marked as taking for  the 11/11/20 encounter (Office Visit) with Minna Merritts, MD.     Allergies:   Calcium channel blockers   Social History   Tobacco Use  . Smoking status: Never Smoker  . Smokeless tobacco: Never Used  Vaping Use  . Vaping Use: Never used  Substance Use Topics  . Alcohol use: No    Alcohol/week: 0.0 standard drinks  . Drug use: No     Family Hx: The patient's family history includes Arthritis in his maternal grandmother; Breast cancer in his maternal aunt; COPD in his sister; Diabetes in his maternal aunt; Heart attack in his brother, father, and mother; Hypertension in his maternal aunt and another family member. There is no history of Prostate cancer, Kidney cancer, Bladder Cancer, or Colon cancer.  ROS:   Please see the history of present illness.    Review of Systems  Constitutional: Negative.   HENT: Negative.   Respiratory: Positive for shortness of breath.   Cardiovascular: Positive for leg swelling.  Gastrointestinal: Negative.   Musculoskeletal: Negative.  Neurological: Negative.   Psychiatric/Behavioral: Negative.   All other systems reviewed and are negative.    Labs/Other Tests and Data Reviewed:    Recent Labs: 07/12/2020: TSH 7.42 08/12/2020: Hemoglobin 13.5; Platelets 195 10/29/2020: B Natriuretic Peptide 217.1; BUN 34; Creatinine, Ser 2.56; Potassium 3.7; Sodium 141   Recent Lipid Panel Lab Results  Component Value Date/Time   CHOL 125 12/23/2018 09:17 AM   TRIG 142.0 12/23/2018 09:17 AM   HDL 27.70 (L) 12/23/2018 09:17 AM   CHOLHDL 5 12/23/2018 09:17 AM   LDLCALC 69 12/23/2018 09:17 AM    Wt Readings from Last 3 Encounters:  11/11/20 264 lb 8 oz (120 kg)  10/29/20 269 lb (122 kg)  10/06/20 271 lb 9.6 oz (123.2 kg)     Exam:    Vital Signs: Vital signs may also be detailed in the HPI BP 138/60 (BP Location: Left Arm, Patient Position: Sitting, Cuff Size: Normal)   Pulse 61   Ht 5\' 5"  (1.651 m)   Wt 264 lb 8 oz (120 kg)   SpO2 97%   BMI  44.02 kg/m   Constitutional:  oriented to person, place, and time. No distress.  HENT:  Head: Grossly normal Eyes:  no discharge. No scleral icterus.  Neck: No JVD, no carotid bruits  Cardiovascular: Regular rate and rhythm, no murmurs appreciated Pulmonary/Chest: Clear to auscultation bilaterally, no wheezes or rails Abdominal: Soft.  no distension.  no tenderness.  Musculoskeletal: Normal range of motion Neurological:  normal muscle tone. Coordination normal. No atrophy Skin: Skin warm and dry Psychiatric: normal affect, pleasant  ASSESSMENT & PLAN:    Chronic respiratory distress/pulmonary hypertension Underlying pulmonary fibrosis, worsening finding on CT scan per radiology Moderately elevated pressures on echo, Unable to access femoral vein right groin on catheterization Continue current torsemide regimen 40 alternating with 20, at the upper limits given renal dysfunction creatinine 2.5 -Recommend reattempt at right heart catheterization, has been set up with Dr. Fletcher Anon.  Large neck, morbid obesity making access difficult May need right brachial -Left heart catheterization already completed -For markedly elevated right heart pressures, could refer to advanced heart failure clinic in [facility] Unclear if he would be a candidate for advanced therapies given fibrosis, morbid obesity -He will hold his sildenafil until after the right heart catheterization, restart after  Morbid obesity ([place]) Contributing to shortness of breath and heart failure symptoms Has been unable to lose weight  Essential hypertension Very high blood pressure during cardiac catheterization,  Several medication changes, improved numbers  Chronic diastolic congestive heart failure (HCC) Creatinine 2.5, relatively unchanged past 2 years Followed by nephrology On torsemide 40 with 20 in the afternoon, sometimes 40 in the afternoon  Coronary artery disease involving native coronary artery of native heart  with angina pectoris (HCC) Recent cardiac catheterization, stable disease A1c at goal, cholesterol at goal  PULMONARY FIBROSIS Followed by pulmonary, CT scan documenting worsening fibrosis On oxygen 2 to 6 L    Total encounter time more than 35 minutes  Greater than 50% was spent in counseling and coordination of care with the patient     Signed, Ida Rogue, MD  11/11/2020 2:48 PM    [place] Office [address] [place] [address], [place], [place] [address]

## 2020-11-11 NOTE — Patient Instructions (Addendum)
Medication Instructions:  No changes  If you need a refill on your cardiac medications before your next appointment, please call your pharmacy.    Lab work: CBC & BMP  Friday 5/20  Walk into medical mall at the check in desk, they will direct you to lab registration, hours for labs are Monday-Friday 07:00am-5:30pm (no appointment necessary)  Testing/Procedures: Right heart cath with Dr. Fletcher Anon for possible pulmonary HTN, CHF, edema, shortness of breath  [facility] Cardiac Cath Instructions   You are scheduled for a Cardiac Cath on:________05/23/2022__  Please arrive at _08:30AM__ on the day of your procedure  Your heart cath is schedule for 09:30am  Please expect a call from our [facility] to pre-register  you  Do not eat/drink anything after midnight  Someone will need to drive you home  It is recommended someone be with you for the first 24 hours after your  procedure  Wear clothes that are easy to get on/off and wear slip on shoes if possible  Please bring a current list of all medications with you  _X__ You may take all of your medications the morning of your procedure with enough water to swallow safely  Don't forget to take your aspirin and Plavix the morning of the heart cath  ___ Do not take these medications before your procedure  Only take half dose insulin the night before  Do not take insulin the morning of your heart cath  Do not take torsemide the morning of your heart cath (may take after)  Day of your procedure: Arrive at the [facility] entrance.  Free valet service is available.  After entering the [facility] please check-in at the registration desk (1st desk on your right) to receive your armband. After receiving your armband someone will escort you to the cardiac cath/special procedures waiting area.  The usual length of stay after your procedure is about 2 to 3 hours.  This can vary.  If you have any questions, please call our  office at [phone], or you may call the cardiac cath lab at [facility] directly at [phone]  Your physician has requested that you have a cardiac catheterization. Cardiac catheterization is used to diagnose and/or treat various heart conditions. Doctors may recommend this procedure for a number of different reasons. The most common reason is to evaluate chest pain. Chest pain can be a symptom of coronary artery disease (CAD), and cardiac catheterization can show whether plaque is narrowing or blocking your heart's arteries. This procedure is also used to evaluate the valves, as well as measure the blood flow and oxygen levels in different parts of your heart. For further information please visit HugeFiesta.tn. Please follow instruction sheet, as given.    Follow-Up:  . You will need a follow up appointment in 3 months  . Providers on your designated Care Team:   . Murray Hodgkins, NP . Christell Faith, PA-C . Marrianne Mood, PA-C   COVID-19 Vaccine Information can be found at: ShippingScam.co.uk For questions related to vaccine distribution or appointments, please email [email or web address] .[email or web address] or call 2032982024.

## 2020-11-15 ENCOUNTER — Telehealth: Payer: Self-pay

## 2020-11-15 NOTE — Telephone Encounter (Signed)
Patients cardiac cath scheduled with Dr. Fletcher Anon on 11/22/20 will need to be rescheduled. Spoke with the patient and made him aware. Patient would prefer to have his procedure here at [name].  Patient is agreeable with the reschedule date of 12/06/20. Adv the patient and his wife that I will call Called cath scheduling to reschedule. Left a message for them to call back to confirm the procedure time.   Pamala Hurry confirmed the procedure tare and time. 12/06/20 @ 9:30am with Dr.Arida  Patients wife made aware of the new procedure date and time. Adv the that the patient will need to arrive 1 hour prior 8:30am. Patient will have his pre-procedure labs the week before. Adv her that the previous pre-procedure instructions still apply.

## 2020-11-22 DIAGNOSIS — R0602 Shortness of breath: Secondary | ICD-10-CM

## 2020-11-24 DIAGNOSIS — E113213 Type 2 diabetes mellitus with mild nonproliferative diabetic retinopathy with macular edema, bilateral: Secondary | ICD-10-CM | POA: Diagnosis not present

## 2020-11-24 DIAGNOSIS — J449 Chronic obstructive pulmonary disease, unspecified: Secondary | ICD-10-CM | POA: Diagnosis not present

## 2020-11-25 ENCOUNTER — Encounter: Payer: Self-pay | Admitting: Family Medicine

## 2020-11-25 ENCOUNTER — Other Ambulatory Visit: Payer: Self-pay

## 2020-11-25 ENCOUNTER — Ambulatory Visit (INDEPENDENT_AMBULATORY_CARE_PROVIDER_SITE_OTHER): Payer: Medicare HMO | Admitting: Family Medicine

## 2020-11-25 VITALS — BP 142/80 | HR 59 | Temp 97.6°F | Ht 65.0 in | Wt 264.0 lb

## 2020-11-25 DIAGNOSIS — E1121 Type 2 diabetes mellitus with diabetic nephropathy: Secondary | ICD-10-CM | POA: Diagnosis not present

## 2020-11-25 DIAGNOSIS — E039 Hypothyroidism, unspecified: Secondary | ICD-10-CM | POA: Diagnosis not present

## 2020-11-25 DIAGNOSIS — T148XXA Other injury of unspecified body region, initial encounter: Secondary | ICD-10-CM | POA: Diagnosis not present

## 2020-11-25 DIAGNOSIS — I2510 Atherosclerotic heart disease of native coronary artery without angina pectoris: Secondary | ICD-10-CM | POA: Diagnosis not present

## 2020-11-25 DIAGNOSIS — R059 Cough, unspecified: Secondary | ICD-10-CM

## 2020-11-25 DIAGNOSIS — R251 Tremor, unspecified: Secondary | ICD-10-CM | POA: Diagnosis not present

## 2020-11-25 DIAGNOSIS — Z794 Long term (current) use of insulin: Secondary | ICD-10-CM | POA: Diagnosis not present

## 2020-11-25 LAB — CBC WITH DIFFERENTIAL/PLATELET
Basophils Absolute: 0 10*3/uL (ref 0.0–0.1)
Basophils Relative: 0.6 % (ref 0.0–3.0)
Eosinophils Absolute: 0.2 10*3/uL (ref 0.0–0.7)
Eosinophils Relative: 2.4 % (ref 0.0–5.0)
HCT: 35.9 % — ABNORMAL LOW (ref 39.0–52.0)
Hemoglobin: 11.9 g/dL — ABNORMAL LOW (ref 13.0–17.0)
Lymphocytes Relative: 9.4 % — ABNORMAL LOW (ref 12.0–46.0)
Lymphs Abs: 0.7 10*3/uL (ref 0.7–4.0)
MCHC: 33.1 g/dL (ref 30.0–36.0)
MCV: 84.8 fl (ref 78.0–100.0)
Monocytes Absolute: 0.6 10*3/uL (ref 0.1–1.0)
Monocytes Relative: 8.5 % (ref 3.0–12.0)
Neutro Abs: 5.8 10*3/uL (ref 1.4–7.7)
Neutrophils Relative %: 79.1 % — ABNORMAL HIGH (ref 43.0–77.0)
Platelets: 169 10*3/uL (ref 150.0–400.0)
RBC: 4.24 Mil/uL (ref 4.22–5.81)
RDW: 16.1 % — ABNORMAL HIGH (ref 11.5–15.5)
WBC: 7.3 10*3/uL (ref 4.0–10.5)

## 2020-11-25 LAB — BASIC METABOLIC PANEL
BUN: 30 mg/dL — ABNORMAL HIGH (ref 6–23)
CO2: 28 mEq/L (ref 19–32)
Calcium: 8.1 mg/dL — ABNORMAL LOW (ref 8.4–10.5)
Chloride: 109 mEq/L (ref 96–112)
Creatinine, Ser: 2.51 mg/dL — ABNORMAL HIGH (ref 0.40–1.50)
GFR: 24.87 mL/min — ABNORMAL LOW (ref >60.00)
Glucose, Bld: 83 mg/dL (ref 70–99)
Potassium: 4.2 mEq/L (ref 3.5–5.1)
Sodium: 143 mEq/L (ref 135–145)

## 2020-11-25 LAB — HEMOGLOBIN A1C: Hgb A1c MFr Bld: 6.4 % (ref 4.6–6.5)

## 2020-11-25 LAB — TSH: TSH: 6.29 u[IU]/mL — ABNORMAL HIGH (ref 0.35–4.50)

## 2020-11-25 MED ORDER — ASPIRIN EC 81 MG PO TBEC: 81.0000 mg | DELAYED_RELEASE_TABLET | Freq: Every day | ORAL | Status: AC

## 2020-11-25 MED ORDER — FLUTICASONE PROPIONATE 50 MCG/ACT NA SUSP: 2.0000 | Freq: Every day | NASAL | 6 refills | Status: AC

## 2020-11-25 NOTE — Patient Instructions (Signed)
Go to the lab on the way out.   If you have mychart we'll likely use that to update you.    Take care.  Glad to see you. I would try using flonase and see if that gradually helps with congestion/runny nose and cough.  If you have more bleeding with use, then stop it.

## 2020-11-25 NOTE — Progress Notes (Signed)
This visit occurred during the SARS-CoV-2 public health emergency.  Safety protocols were in place, including screening questions prior to the visit, additional usage of staff PPE, and extensive cleaning of exam room while observing appropriate contact time as indicated for disinfecting solutions.  Diabetes:  Using medications without difficulties: yes Hypoglycemic episodes: not unless prolonged fasting.  Cautions discussed with patient. Hyperglycemic episodes: no Feet problems: no Blood Sugars averaging: not checked often.   eye exam within last year: yes Labs pending.   His heart cath is pending for next month.   O2 use d/w pt.  On 3L at [place].  He had hypoxia even with O2 use and had to change back to O2 tank.  Routine cautions given oxygen tank used discussed with patient.  Episodic R hand tremor noted.  No tremor on exam but patient noted with drinking water/eating, moving his hand to his mouth.  Hypothyroidism on 228mcg levothyroxine per week total.  Compliant with replacement.  Due for follow-up labs.  Bruising noted. On extensor side of arms.  On aspirin and plavix.  No bleeding o/w except for some limited nosebleeds.   He has had persistent cough and post nasal gtt.  No fevers.  Already on montelukast w/o relief.    Meds, vitals, and allergies reviewed.   ROS: Per HPI unless specifically indicated in ROS section   GEN: nad, alert and oriented HEENT: ncat, nasal exam stuffy B.  No bleeding.   NECK: supple w/o LA CV: rrr. PULM: ctab, no inc wob ABD: soft, +bs EXT: 1-2+ BLE edema SKIN: no acute rash

## 2020-11-29 DIAGNOSIS — T148XXA Other injury of unspecified body region, initial encounter: Secondary | ICD-10-CM | POA: Insufficient documentation

## 2020-11-29 DIAGNOSIS — R059 Cough, unspecified: Secondary | ICD-10-CM | POA: Insufficient documentation

## 2020-11-29 NOTE — Assessment & Plan Note (Signed)
With clear lungs noted.  Could be exacerbated by postnasal drip.  Reasonable to try Flonase with routine cautions and update me if needed.  He agrees.  See after visit summary.

## 2020-11-29 NOTE — Assessment & Plan Note (Signed)
Not noted on exam today.  I would defer treatment until we get his other issues addressed.  Check TSH today.

## 2020-11-29 NOTE — Assessment & Plan Note (Signed)
Continue 70/30 insulin.  60 units in the morning and 40 units in the evening.  I would not increase his insulin given the goal to limit the risk of hypoglycemia, cautions discussed with patient.  See notes on labs.  He agrees with plan.

## 2020-11-29 NOTE — Assessment & Plan Note (Signed)
Check CBC today and though I expect this to be benign bruising exacerbated by aspirin and Plavix.

## 2020-11-29 NOTE — Assessment & Plan Note (Signed)
With heart cath pending.  Reasonable to check creatinine today given the potential contrast exposure.  I need cardiology input and I appreciate the help of all involved.

## 2020-11-29 NOTE — Assessment & Plan Note (Signed)
No change in medication at this point.  See notes on TSH.

## 2020-11-30 ENCOUNTER — Telehealth: Payer: Self-pay

## 2020-11-30 NOTE — Telephone Encounter (Signed)
Pt left v/m requesting cb about lab results from 11/25/20. Sending note to Dr [name].

## 2020-12-01 ENCOUNTER — Other Ambulatory Visit: Payer: Self-pay | Admitting: Family Medicine

## 2020-12-01 ENCOUNTER — Telehealth: Payer: Self-pay

## 2020-12-01 ENCOUNTER — Other Ambulatory Visit
Admission: RE | Admit: 2020-12-01 | Discharge: 2020-12-01 | Disposition: A | Discharge status: Home / Self Care | Payer: Medicare HMO | Attending: Cardiovascular Disease | Admitting: Cardiovascular Disease

## 2020-12-01 DIAGNOSIS — Z01810 Encounter for preprocedural cardiovascular examination: Secondary | ICD-10-CM

## 2020-12-01 DIAGNOSIS — E1121 Type 2 diabetes mellitus with diabetic nephropathy: Secondary | ICD-10-CM

## 2020-12-01 DIAGNOSIS — Z794 Long term (current) use of insulin: Secondary | ICD-10-CM

## 2020-12-01 DIAGNOSIS — E039 Hypothyroidism, unspecified: Secondary | ICD-10-CM

## 2020-12-01 LAB — COMPREHENSIVE METABOLIC PANEL
ALT: 20 U/L (ref 0–44)
AST: 21 U/L (ref 15–41)
Albumin: 2.7 g/dL — ABNORMAL LOW (ref 3.5–5.0)
Alkaline Phosphatase: 78 U/L (ref 38–126)
Anion gap: 9 (ref 5–15)
BUN: 34 mg/dL — ABNORMAL HIGH (ref 8–23)
CO2: 24 mmol/L (ref 22–32)
Calcium: 8.3 mg/dL — ABNORMAL LOW (ref 8.9–10.3)
Chloride: 109 mmol/L (ref 98–111)
Creatinine, Ser: 2.49 mg/dL — ABNORMAL HIGH (ref 0.61–1.24)
GFR, Estimated: 27 mL/min — ABNORMAL LOW (ref >60)
Glucose, Bld: 76 mg/dL (ref 70–99)
Potassium: 3.9 mmol/L (ref 3.5–5.1)
Sodium: 142 mmol/L (ref 135–145)
Total Bilirubin: 0.5 mg/dL (ref 0.3–1.2)
Total Protein: 6.3 g/dL — ABNORMAL LOW (ref 6.5–8.1)

## 2020-12-01 LAB — CBC
HCT: 36.4 % — ABNORMAL LOW (ref 39.0–52.0)
Hemoglobin: 11.8 g/dL — ABNORMAL LOW (ref 13.0–17.0)
MCH: 28.5 pg (ref 26.0–34.0)
MCHC: 32.4 g/dL (ref 30.0–36.0)
MCV: 87.9 fL (ref 80.0–100.0)
Platelets: 169 10*3/uL (ref 150–400)
RBC: 4.14 MIL/uL — ABNORMAL LOW (ref 4.22–5.81)
RDW: 15 % (ref 11.5–15.5)
WBC: 7.6 10*3/uL (ref 4.0–10.5)
nRBC: 0 % (ref 0.0–0.2)

## 2020-12-01 MED ORDER — LEVOTHYROXINE SODIUM 25 MCG PO TABS: ORAL_TABLET | ORAL | 1 refills | Status: DC

## 2020-12-01 NOTE — Telephone Encounter (Signed)
Patient called and wanted to know his lab results from his office visit last week. Instructed patient that Dr. [name] has not seen them yet, but as soon as he does we will call him with the results. Patient verbalized understanding.

## 2020-12-01 NOTE — Telephone Encounter (Signed)
See result note.  

## 2020-12-02 ENCOUNTER — Other Ambulatory Visit: Payer: Self-pay | Admitting: Family Medicine

## 2020-12-03 ENCOUNTER — Other Ambulatory Visit: Payer: Self-pay

## 2020-12-03 ENCOUNTER — Other Ambulatory Visit: Payer: Self-pay | Admitting: Pulmonary Disease

## 2020-12-03 DIAGNOSIS — E039 Hypothyroidism, unspecified: Secondary | ICD-10-CM

## 2020-12-06 ENCOUNTER — Other Ambulatory Visit: Payer: Self-pay

## 2020-12-06 ENCOUNTER — Ambulatory Visit
Admission: RE | Admit: 2020-12-06 | Discharge: 2020-12-06 | Disposition: A | Discharge status: Home / Self Care | Payer: Medicare HMO | Attending: Cardiovascular Disease | Admitting: Cardiovascular Disease

## 2020-12-06 ENCOUNTER — Encounter: Payer: Self-pay | Admitting: Cardiovascular Disease

## 2020-12-06 ENCOUNTER — Encounter
Admission: RE | Disposition: A | Discharge status: Home / Self Care | Payer: Self-pay | Source: Home / Self Care | Attending: Cardiovascular Disease

## 2020-12-06 DIAGNOSIS — I25119 Atherosclerotic heart disease of native coronary artery with unspecified angina pectoris: Secondary | ICD-10-CM | POA: Diagnosis not present

## 2020-12-06 DIAGNOSIS — E1122 Type 2 diabetes mellitus with diabetic chronic kidney disease: Secondary | ICD-10-CM | POA: Insufficient documentation

## 2020-12-06 DIAGNOSIS — J8410 Pulmonary fibrosis, unspecified: Secondary | ICD-10-CM | POA: Diagnosis not present

## 2020-12-06 DIAGNOSIS — I5032 Chronic diastolic (congestive) heart failure: Secondary | ICD-10-CM | POA: Diagnosis not present

## 2020-12-06 DIAGNOSIS — I272 Pulmonary hypertension, unspecified: Secondary | ICD-10-CM | POA: Insufficient documentation

## 2020-12-06 DIAGNOSIS — I130 Hypertensive heart and chronic kidney disease with heart failure and stage 1 through stage 4 chronic kidney disease, or unspecified chronic kidney disease: Secondary | ICD-10-CM | POA: Insufficient documentation

## 2020-12-06 DIAGNOSIS — Z6841 Body Mass Index (BMI) 40.0 and over, adult: Secondary | ICD-10-CM | POA: Insufficient documentation

## 2020-12-06 DIAGNOSIS — R0602 Shortness of breath: Secondary | ICD-10-CM

## 2020-12-06 DIAGNOSIS — N183 Chronic kidney disease, stage 3 unspecified: Secondary | ICD-10-CM | POA: Diagnosis not present

## 2020-12-06 DIAGNOSIS — I5033 Acute on chronic diastolic (congestive) heart failure: Secondary | ICD-10-CM

## 2020-12-06 HISTORY — PX: RIGHT HEART CATH: CATH118263

## 2020-12-06 LAB — GLUCOSE, CAPILLARY
Glucose-Capillary: 159 mg/dL — ABNORMAL HIGH (ref 70–99)
Glucose-Capillary: 154 mg/dL — ABNORMAL HIGH (ref 70–99)

## 2020-12-06 SURGERY — RIGHT HEART CATH
Anesthesia: Moderate Sedation | Laterality: Right

## 2020-12-06 MED ORDER — FENTANYL CITRATE (PF) 100 MCG/2ML IJ SOLN
INTRAMUSCULAR | Status: DC | PRN
  Administered 2020-12-06: 25 ug via INTRAVENOUS

## 2020-12-06 MED ORDER — SODIUM CHLORIDE 0.9% FLUSH: 3.0000 mL | Freq: Two times a day (BID) | INTRAVENOUS | Status: DC

## 2020-12-06 MED ORDER — SODIUM CHLORIDE 0.9% FLUSH: 3.0000 mL | INTRAVENOUS | Status: DC | PRN

## 2020-12-06 MED ORDER — MIDAZOLAM HCL 2 MG/2ML IJ SOLN
INTRAMUSCULAR | Status: AC
  Filled 2020-12-06: qty 2

## 2020-12-06 MED ORDER — MIDAZOLAM HCL 2 MG/2ML IJ SOLN
INTRAMUSCULAR | Status: DC | PRN
  Administered 2020-12-06: 1 mg via INTRAVENOUS

## 2020-12-06 MED ORDER — LIDOCAINE HCL (PF) 1 % IJ SOLN
INTRAMUSCULAR | Status: AC
  Filled 2020-12-06: qty 30

## 2020-12-06 MED ORDER — SODIUM CHLORIDE 0.9 % IV SOLN: INTRAVENOUS | Status: DC

## 2020-12-06 MED ORDER — SODIUM CHLORIDE 0.9% FLUSH
3.0000 mL | INTRAVENOUS | Status: DC | PRN
  Administered 2020-12-06: 3 mL via INTRAVENOUS

## 2020-12-06 MED ORDER — HEPARIN (PORCINE) IN NACL 1000-0.9 UT/500ML-% IV SOLN
INTRAVENOUS | Status: AC
  Filled 2020-12-06: qty 1000

## 2020-12-06 MED ORDER — FENTANYL CITRATE (PF) 100 MCG/2ML IJ SOLN
INTRAMUSCULAR | Status: AC
  Filled 2020-12-06: qty 2

## 2020-12-06 MED ORDER — HEPARIN (PORCINE) IN NACL 1000-0.9 UT/500ML-% IV SOLN
INTRAVENOUS | Status: DC | PRN
  Administered 2020-12-06: 500 mL

## 2020-12-06 MED ORDER — SODIUM CHLORIDE 0.9 % IV SOLN: 250.0000 mL | INTRAVENOUS | Status: DC | PRN

## 2020-12-06 MED ORDER — ACETAMINOPHEN 325 MG PO TABS: 650.0000 mg | ORAL_TABLET | ORAL | Status: DC | PRN

## 2020-12-06 MED ORDER — TORSEMIDE 20 MG PO TABS: 40.0000 mg | ORAL_TABLET | Freq: Every day | ORAL | 2 refills | Status: DC

## 2020-12-06 MED ORDER — ASPIRIN 81 MG PO CHEW: 81.0000 mg | CHEWABLE_TABLET | ORAL | Status: DC

## 2020-12-06 MED ORDER — ONDANSETRON HCL 4 MG/2ML IJ SOLN: 4.0000 mg | Freq: Four times a day (QID) | INTRAMUSCULAR | Status: DC | PRN

## 2020-12-06 MED ORDER — LIDOCAINE HCL (PF) 1 % IJ SOLN
INTRAMUSCULAR | Status: DC | PRN
Start: 2020-12-06 — End: 2020-12-06
  Administered 2020-12-06: 5 mL

## 2020-12-06 SURGICAL SUPPLY — 6 items
CATH BALLN WEDGE 5F 110CM (CATHETERS) ×2 IMPLANT
DRAPE BRACHIAL (DRAPES) ×2 IMPLANT
GUIDEWIRE .025 260CM (WIRE) ×2 IMPLANT
KIT RIGHT HEART (MISCELLANEOUS) ×2 IMPLANT
PACK CARDIAC CATH (CUSTOM PROCEDURE TRAY) ×2 IMPLANT
SHEATH GLIDE SLENDER 4/5FR (SHEATH) ×2 IMPLANT

## 2020-12-06 NOTE — Interval H&P Note (Signed)
History and Physical Interval Note:  12/06/2020 9:42 AM  Stephen [name]  has presented today for surgery, with the diagnosis of RT Cath   Shortness of breath  Pulmonary hypertension.  The various methods of treatment have been discussed with the patient and family. After consideration of risks, benefits and other options for treatment, the patient has consented to  Procedure(s): RIGHT HEART CATH (Right) as a surgical intervention.  The patient's history has been reviewed, patient examined, no change in status, stable for surgery.  I have reviewed the patient's chart and labs.  Questions were answered to the patient's satisfaction.     Kathlyn [place]

## 2020-12-07 ENCOUNTER — Other Ambulatory Visit: Payer: Self-pay | Admitting: Cardiovascular Disease

## 2020-12-13 ENCOUNTER — Other Ambulatory Visit: Payer: Self-pay

## 2020-12-13 ENCOUNTER — Ambulatory Visit (INDEPENDENT_AMBULATORY_CARE_PROVIDER_SITE_OTHER): Payer: Medicare HMO | Admitting: Physician Assistant

## 2020-12-13 ENCOUNTER — Encounter: Payer: Self-pay | Admitting: Physician Assistant

## 2020-12-13 VITALS — BP 120/66 | HR 60 | Ht 65.0 in | Wt 264.0 lb

## 2020-12-13 DIAGNOSIS — R0600 Dyspnea, unspecified: Secondary | ICD-10-CM

## 2020-12-13 DIAGNOSIS — I2510 Atherosclerotic heart disease of native coronary artery without angina pectoris: Secondary | ICD-10-CM

## 2020-12-13 DIAGNOSIS — E039 Hypothyroidism, unspecified: Secondary | ICD-10-CM | POA: Diagnosis not present

## 2020-12-13 DIAGNOSIS — I10 Essential (primary) hypertension: Secondary | ICD-10-CM

## 2020-12-13 DIAGNOSIS — R0609 Other forms of dyspnea: Secondary | ICD-10-CM

## 2020-12-13 DIAGNOSIS — I270 Primary pulmonary hypertension: Secondary | ICD-10-CM

## 2020-12-13 DIAGNOSIS — I25118 Atherosclerotic heart disease of native coronary artery with other forms of angina pectoris: Secondary | ICD-10-CM

## 2020-12-13 DIAGNOSIS — J8410 Pulmonary fibrosis, unspecified: Secondary | ICD-10-CM | POA: Diagnosis not present

## 2020-12-13 DIAGNOSIS — E1121 Type 2 diabetes mellitus with diabetic nephropathy: Secondary | ICD-10-CM | POA: Diagnosis not present

## 2020-12-13 DIAGNOSIS — I5032 Chronic diastolic (congestive) heart failure: Secondary | ICD-10-CM | POA: Diagnosis not present

## 2020-12-13 DIAGNOSIS — Z794 Long term (current) use of insulin: Secondary | ICD-10-CM

## 2020-12-13 MED ORDER — HYDRALAZINE HCL 50 MG PO TABS: 50.0000 mg | ORAL_TABLET | Freq: Three times a day (TID) | ORAL | 3 refills | Status: DC

## 2020-12-13 NOTE — Patient Instructions (Addendum)
If feeling poorly again, please take your blood pressure and call the office if the top number is below 100.   Medication Instructions:  No changes at this time.  *If you need a refill on your cardiac medications before your next appointment, please call your pharmacy*   Lab Work: [place] today   If you have labs (blood work) drawn today and your tests are completely normal, you will receive your results only by: [place] (if you have MyChart) OR A paper copy in the mail If you have any lab test that is abnormal or we need to change your treatment, we will call you to review the results.   Testing/Procedures: None   Follow-Up: At [facility], you and your health needs are our priority.  As part of our continuing mission to provide you with exceptional heart care, we have created designated Provider Care Teams.  These Care Teams include your primary Cardiologist (physician) and Advanced Practice Providers (APPs -  Physician Assistants and Nurse Practitioners) who all work together to provide you with the care you need, when you need it.  We recommend signing up for the patient portal called "MyChart".  Sign up information is provided on this After Visit Summary.  MyChart is used to connect with patients for Virtual Visits (Telemedicine).  Patients are able to view lab/test results, encounter notes, upcoming appointments, etc.  Non-urgent messages can be sent to your provider as well.   To learn more about what you can do with MyChart, go to [email or web address].    Your next appointment:   4-6 month(s)  The format for your next appointment:   In Person  Provider:   Ida Rogue, MD or Marrianne Mood, PA-C

## 2020-12-13 NOTE — Progress Notes (Addendum)
Office Visit    Patient Name: Stephen [name] Date of Encounter: 12/13/2020  PCP:  [name], MD   [place]  Cardiologist:  Ida Rogue, MD  Advanced Practice Provider:  No care team member to display Electrophysiologist:  None   Chief Complaint    Chief Complaint  Patient presents with   Follow-up    s/p cardiac cath     75 y.o. male with history of pulmonary hypertension, chronic diastolic CHF, CAD, pulmonary fibrosis s/p wedge resection, obesity, COPD on home oxygen, OSA on CPAP, DM2, CKD, and who presents today for follow-up after recent RHC.  Past Medical History    Past Medical History:  Diagnosis Date   Adrenal gland anomaly    Arthritis    CAD (coronary artery disease)    a. 04/2006 MI and PCI/stenting to mLCx & mRCA; b. 07/2009 Cath: patent LCX/RCA stents;  c. 12/2016 NSTEMI/Cath: LM 30, LAD 30p, 48m 30d, D1/2/3 min irregs, LCX 1087mSR, OM1 min irregs, RCA 20p, 10 ISR, 7037m0d, RPDA min irregs, RPAV 40-->Med Rx; d. 12/2017 MV: fixed lateral wall scar, mild anterior/anterior septal ischemia. EF 30-44%.   CHF (congestive heart failure) (HCC)    CKD (chronic kidney disease), stage III (HCC)    Colon polyps    DM2 (diabetes mellitus, type 2) (HCC)    insulin requiring   Dyslipidemia    GERD (gastroesophageal reflux disease)    History of echocardiogram    a. TTE 12/2016: EF 50-55%, mod concentric LVH, images inadequate for wall motion assessment, not technically sufficienct to allow for LV dias fxn, calcified mitral annulus    HTN (hypertension)    Kidney stones    Left Renal artery stenosis ([facility]  a. 07/2013 s/p PTA/stenting ([place]); b. 07/2017 Renal Duplex: No significant RAS.   Myocardial infarction (HCC)    OSA (obstructive sleep apnea)    Pulmonary fibrosis ([facility]  a. 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   Recurrent UTI    Rotator cuff disorder    right   Skin  cancer    head   Thyroid disorder    Venous insufficiency of both lower extremities    a. s/p laser treatment.   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   CARPAL TUNNEL RELEASE     bilateral   CATARACT EXTRACTION W/PHACO Right 07/08/2018   Procedure: CATARACT EXTRACTION PHACO AND INTRAOCULAR LENS PLACEMENT ([facility];  Surgeon: KinEulogio BearD;  [facility];  Laterality: Right;  Diabetic - insulin sleep apnea   CATARACT EXTRACTION W/PHACO Left 08/05/2018   Procedure: CATARACT EXTRACTION PHACO AND INTRAOCULAR LENS PLACEMENT ([facility] DIABET[facility];  Surgeon: KinEulogio BearD;  [facility];  Laterality: Left;  DIABETIC   CORONARY ANGIOGRAPHY N/A 08/19/2020   Procedure: CORONARY ANGIOGRAPHY;  Surgeon: GolMinna MerrittsD;  [facility];  Service: Cardiovascular;  Laterality: N/A;   CORONARY ANGIOPLASTY WITH STENT PLACEMENT  2007   x 2   KNEE SURGERY     right   LEFT HEART CATH AND CORONARY ANGIOGRAPHY N/A 01/11/2017   Procedure: Left Heart Cath and Coronary Angiography;  Surgeon: AriWellington HampshireD;  [facility];  Service: Cardiovascular;  Laterality: N/A;   LUNG BIOPSY  RENAL ARTERY STENT  2015   L   RIGHT HEART CATH Right 12/06/2020   Procedure: RIGHT HEART CATH;  Surgeon: Wellington Hampshire, MD;  Location: [place] [facility];  Service: Cardiovascular;  Laterality: Right;   SKIN CANCER EXCISION     back of head    Allergies  Allergies  Allergen Reactions   Calcium Channel Blockers     Would avoid if possible due to h/o peripheral edema    History of Present Illness    Stephen [name] is a 75 y.o. male with PMH as above.  He has history of pulmonary hypertension, chronic diastolic heart failure, CAD, pulmonary fibrosis s/p wedge resection, obesity, COPD on home oxygen, OSA on CPAP,  DM2, and CKD.  Medical management and intervention has been limited by renal function in the past.  History of CAD s/p PCI to mid RCA and mid left circumflex 04/2006.  Catheterization 12/2016 with elevated LVEDP.  MPI 12/2017 with old region of infarct, possible region of ischemia there is significant attenuation defect.  Catheterization deferred, given he was asymptomatic and with consideration of renal function.  Echo 09/2018 with EF 45 to 50%, moderately elevated right heart pressures with RVSP 50 mmHg.  He does have a history of interstitial fibrosis on chronic oxygen s/p wedge resection.  He had open lung biopsy 05/2008 for bilateral pulmonary infiltrates.  He has a history of renal artery stenosis and underwent left renal artery stent in early 2020 for 80+ percent stenosis.  He also reportedly has undergone laser treatment of lower extremity veins for leg swelling and venous insufficiency.  He was seen by primary care 07/2020 with increasing lower extremity edema and dyspnea.  Recommendation was for torsemide 2 tablets daily for 5 days.  He spoke to his PCP 08/04/2020 and reported oxygen level dropping with ambulation despite 4 days of increased diuretics.  08/06/2020 echo with LVEF 55 to 60%, G1 DD, moderate LAE.  Images were noted as challenging.  Seen 08/12/2020 for follow-up after his echo.  He reported his oxygen level dropping with minimal movement.  He reported 85 to 86% oxygen levels.  This was associated with chest tightness.  He had increased his home oxygen to 5 L with some improvement.  He reported being on his increased oxygen for about 1 month.  He reported nonproductive/dry cough that resolved with cough drop.  He had some feelings of lightheadedness when taking the increased dose of torsemide.  He did note that his leg swelling improved.  He denied any change in dyspnea or oxygen saturation with increased dose of torsemide.  His wife noted that the patient has been overdosed with fluid  pills before and thus patient/wife understandably hesitant regarding increased dose diuretic.  Given his increased dyspnea on exertion and chest tightness, recommendation was for right and left heart catheterization for assessment of worsening CAD versus right heart pressures.  He was continued on a schedule of alternate torsemide 20 mg and 40 mg every other day.  It was noted that he could take an additional 20 mg of torsemide if weight gain 2 pounds overnight or 5 pounds in a week.  Follow-up with Dr. Patsey Berthold of pulmonology was recommended, as well as possible referral to the advanced heart failure clinic in the future.  Ongoing follow-up with [facility]/Dr. Holley Raring recommended.  On 08/19/2020, he underwent left heart catheterization with coronary angiography.  It was noted that access was challenging, given morbid obesity.  Severe left circumflex disease  noted with 100% occluded mid vessel and collaterals, though this was known from prior catheterizations.  Moderate to severe mid to distal RCA disease noted and unchanged from previous catheterizations.  Moderate proximal and mid LAD disease also noted. Recommendation was for medical management.  If right heart catheterization needed in the future, access would need changed.  It was noted neck would not be an appropriate choice, given the thickness of his neck.  Markedly elevated blood pressure was noted during the case.  Aggressive BP control recommended in the future.  It was thought that this uncontrolled BP could be contributing to his shortness of breath, as well as his morbid obesity.  08/27/2020 PFTs completed with moderate restrictive lung disease without significant bronchodilator response.  Seen 10/29/20 and not feeling well since his catheterization. He noted DOE and low oxygen saturations. He was trying to avoid salt and restrict fluids. He was eating more fresh vegetables. He reported dizziness with lower O2, LEE, and CP with lower O2.   Case discussed with primary cardiologist and adavanced HF clinic also discussed. Pt was then seen 11/11/20 by his primary cardiologist and set up for [place].  He underwent RHC 12/06/20 with recommendation to increase to torsemide 8m daily and recheck a BMET in 1 week. RHC showed mildly to moderately elevated filling pressures, mild PHTN, and high CO.   Today, 12/13/2020, he returns to clinic and notes that he has been doing well since his cardiac catheterization until yesterday 12/12/2020.  He reports feeling dizzy and nauseated all of yesterday, so he did not take any of his torsemide.  He has not yet taken any torsemide today and wants to hold off on taking any further doses until he gets a repeat BMET.  (2 days without torsemide) he reportedly has felt better since holding off on his torsemide.  His weight is up 4 pounds from his catheterization though stable from previous clinic weight.  He denies any chest pain.  He has been using 3-6 L nasal cannula oxygen with breathing stable.  He reports oxygen saturation does drop at home with ambulation.  He is still trying to eat healthy with lots of vegetables.  He reports swelling in his legs that progresses throughout the day and for which she is wearing compression stockings.  Swelling has improved over time but is still apparent.  No racing heart rate or skipped heartbeats.  He is taking 2 potassium pills with his torsemide.  He and his wife recently bought a new blood pressure cuff but did not have any recent BP readings today.  Recommendation as below was to monitor BP next time he is dizzy and nauseated, as he could have low blood pressure; however, this could also occur with volume overload and high blood pressure, so it is important to continue to check his vitals at home.  No signs or symptoms of bleeding.  Right antecubital catheterization site without evidence of swelling or infection, though slightly proximal to the site is an abrasion that the patient notes  is due to the tape used during the procedure.  He denies any signs of infection, erythema, or edema associated with this abrasion and is agreeable to continue to monitor and contact the office if any signs of infection.  He and his wife prefer to hold off on torsemide until getting the lab results back, reporting a past history of overdiuresis that resulted in injury to his kidneys.  Home Medications    Current Outpatient Medications on File Prior  to Visit  Medication Sig Dispense Refill   ACCU-CHEK SOFTCLIX LANCETS lancets Use as instructed to check blood sugar three times daily and as needed.  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 100 each 5   albuterol (VENTOLIN HFA) 108 (90 Base) MCG/ACT inhaler INHALE 2 PUFFS INTO THE LUNGS EVERY 6 HOURS AS NEEDED FOR WHEEZING OR SHORTNESS OF BREATH. DISPENSE 3 INHALERS 27 g 0   Alcohol Swabs (B-D SINGLE USE SWABS REGULAR) PADS USE TO CLEANS AREA PRIOR TO CHECKING BLOOD SUGAR THREE TIMES DAILY AND AS NEEDED 400 each 3   aspirin EC 81 MG tablet Take 1 tablet (81 mg total) by mouth daily. Swallow whole.     atorvastatin (LIPITOR) 80 MG tablet TAKE 1 TABLET AT BEDTIME 90 tablet 1   Blood Glucose Monitoring Suppl (ACCU-CHEK AVIVA PLUS) w/Device KIT Use to check blood sugar three times daily and as needed.  Diagnosis: E11.22  Insulin-dependent 1 kit 0   carvedilol (COREG) 12.5 MG tablet TAKE 1 TABLET TWO TIMES DAILY WITH MEALS 180 tablet 1   cloNIDine (CATAPRES) 0.3 MG tablet TAKE 1 TABLET THREE TIMES DAILY 270 tablet 0   clopidogrel (PLAVIX) 75 MG tablet TAKE 1 TABLET EVERY DAY 90 tablet 2   diphenhydrAMINE (BENADRYL) 50 MG tablet Take 50 mg by mouth at bedtime as needed for allergies.     dorzolamide-timolol (COSOPT) 22.3-6.8 MG/ML ophthalmic solution Place 1 drop into both eyes in the morning and at bedtime.     DROPLET INSULIN SYRINGE 30G X 5/16" 1 ML MISC USE AS DIRECTED TO INJECT INSULIN 200 each 2   ezetimibe (ZETIA) 10 MG tablet TAKE 1 TABLET EVERY DAY 90  tablet 2   fluticasone (FLONASE) 50 MCG/ACT nasal spray Place 2 sprays into both nostrils daily. 16 g 6   gabapentin (NEURONTIN) 400 MG capsule Take 2 tabs in the AM and 3 at night. 450 capsule 3   glucose blood (ACCU-CHEK AVIVA PLUS) test strip USE AS INSTRUCTED TO CHECK BLOOD SUGAR 3 TIMES DAILY OR AS NEEDED 300 strip 1   hydrALAZINE (APRESOLINE) 50 MG tablet Take 1 tablet (50 mg total) by mouth 3 (three) times daily. 90 tablet 6   HYDROcodone-acetaminophen (NORCO/VICODIN) 5-325 MG tablet Take 1 tablet by mouth every 6 (six) hours as needed for moderate pain. 20 tablet 0   insulin NPH-regular Human (70-30) 100 UNIT/ML injection Inject 40-60 Units into the skin as directed. Take 60 units in the morning and 40 units in the evening     isosorbide mononitrate (IMDUR) 60 MG 24 hr tablet TAKE 1 AND 1/2 TABLETS TWICE DAILY 270 tablet 0   levothyroxine (SYNTHROID) 25 MCG tablet take 2 of the 25 mcg tablets Monday through Friday with 1 tablet on Saturday and Sunday.  12 pills/week. 180 tablet 1   levothyroxine (SYNTHROID) 50 MCG tablet TAKE 1 TABLET DAILY BEFORE BREAKFAST. 90 tablet 1   montelukast (SINGULAIR) 10 MG tablet Take 1 tablet (10 mg total) by mouth daily. 30 tablet 2   nitroGLYCERIN (NITROSTAT) 0.4 MG SL tablet Place 1 tablet (0.4 mg total) under the tongue every 5 (five) minutes as needed. May repeat x3 25 tablet prn   omeprazole (PRILOSEC) 20 MG capsule Take 20 mg by mouth daily before breakfast.     potassium chloride (K-DUR) 10 MEQ tablet Take 1-2 tablets (10-20 mEq total) by mouth as directed. Alternating every other day. Take 1 tablet (10 mEq) daily and 2 tablets (20 mEq) daily 180 tablet 3   Sennosides (EX-LAX) 15  MG CHEW Chew 15 mg by mouth 2 (two) times daily as needed (constipation).     sildenafil (REVATIO) 20 MG tablet Take 1 tablet (20 mg total) by mouth 3 (three) times daily. 270 tablet 3   tamsulosin (FLOMAX) 0.4 MG CAPS capsule TAKE 1 CAPSULE AT BEDTIME. 90 capsule 1   torsemide  (DEMADEX) 20 MG tablet Take 2 tablets (40 mg total) by mouth daily. Alternate every other day. 1 tablet (20 mg) daily and 2 tablets (40 mg) daily 180 tablet 2   TRELEGY ELLIPTA 200-62.5-25 MCG/INH AEPB INHALE 1 PUFF ONCE DAILY 60 each 0   No current facility-administered medications on file prior to visit.    Review of Systems    He denies chest pain or shortness of breath at rest on his nasal cannula oxygen.  He reports breathing stable since his catheterization.  Oxygen saturations continued to drop with ambulation.  He reports feeling dizzy and nauseated yesterday 6/12, attributed to his diuretic, and improving with holding his diuretic.    No falls or syncopal episodes.  He has ongoing edema, though improved over time.  He denies palpitations,  v, syncope, weight gain, or early satiety.  All other systems reviewed and are otherwise negative except as noted above.  Physical Exam    VS:  BP 120/66 (BP Location: Right Arm, Patient Position: Sitting, Cuff Size: Large)   Pulse 60   Ht 5' 5" (1.651 m)   Wt 264 lb (119.7 kg)   SpO2 97% Comment: on 6L O2  BMI 43.93 kg/m  , BMI Body mass index is 43.93 kg/m. GEN: Obese male, in no acute distress.  Joined by his wife. HEENT: normal. Neck: Supple. No carotid bruits, or masses.  JVD difficult to assess due to body habitus Cardiac:RRR, no murmurs, rubs, or gallops. No clubbing, cyanosis. 1-2+ bilateral lower extremity edema to level of thigh.  Radials/DP/PT 2+ and equal bilaterally.  R antecubital catheterization site without swelling, erythema, infection, or hematoma. Of note, he does have an abrasion just above his site, reportedly from his tape, and this was advised as important to keep clean and dry (call the office if any signs of infection) but does not appear infected today Respiratory: Distant breath sounds. Coarse breath sounds on the R with crackles, Left sided breath sounds reduced. Respirations regular and unlabored. GI: soft, nontender,  not distended, BS + x 4. MS: no deformity or atrophy. Skin: warm and dry, no rash. Neuro:  Strength and sensation are intact. Psych: Normal affect.  Accessory Clinical Findings    ECG personally reviewed by me today -NSR, 60 bpm, PR interval 204 ms/first-degree AV block, LAFB, LVH with repolarization abnormality, QRS 122 ms, poor R wave progression in inferior leads and V1 to V6- no acute changes.  VITALS Reviewed today   Temp Readings from Last 3 Encounters:  12/06/20 97.8 F (36.6 C) (Oral)  11/25/20 97.6 F (36.4 C) (Temporal)  10/06/20 98.7 F (37.1 C) (Temporal)   BP Readings from Last 3 Encounters:  12/13/20 120/66  12/06/20 (!) 127/53  11/25/20 (!) 142/80   Pulse Readings from Last 3 Encounters:  12/13/20 60  12/06/20 (!) 49  11/25/20 (!) 59    Wt Readings from Last 3 Encounters:  12/13/20 264 lb (119.7 kg)  12/06/20 260 lb (117.9 kg)  11/25/20 264 lb (119.7 kg)     LABS  reviewed today   Lab Results  Component Value Date   WBC 7.6 12/01/2020   HGB 11.8 (L)  12/01/2020   HCT 36.4 (L) 12/01/2020   MCV 87.9 12/01/2020   PLT 169 12/01/2020   Lab Results  Component Value Date   CREATININE 2.49 (H) 12/01/2020   BUN 34 (H) 12/01/2020   NA 142 12/01/2020   K 3.9 12/01/2020   CL 109 12/01/2020   CO2 24 12/01/2020   Lab Results  Component Value Date   ALT 20 12/01/2020   AST 21 12/01/2020   ALKPHOS 78 12/01/2020   BILITOT 0.5 12/01/2020   Lab Results  Component Value Date   CHOL 125 12/23/2018   HDL 27.70 (L) 12/23/2018   LDLCALC 69 12/23/2018   TRIG 142.0 12/23/2018   CHOLHDL 5 12/23/2018    Lab Results  Component Value Date   HGBA1C 6.4 11/25/2020   Lab Results  Component Value Date   TSH 6.29 (H) 11/25/2020     STUDIES/PROCEDURES reviewed today   RHC 12/06/20 Successful right heart catheterization via the right brachial vein. Right heart catheterization showed mildly to moderately elevated filling pressures, mild pulmonary hypertension  and high cardiac output. RA: 13 mmHg PCW: 20 mmHg with a V wave of 27 mmHg PA: 44/ 18 with a mean of 30 mmHg.  Pulmonary vascular resistance is only 1.14 [place] unit RV: 48 /12 mmHg PA sat is 97% with cardiac output of 8.79 and cardiac index of 3.98. Recommendations: Recommend increasing torsemide to 40 mg once daily.  Check basic metabolic profile in 1 week.  Coronary angiography 08/19/20 Coronary Diagrams   Diagnostic Dominance: Right    Coronary angiography:  Coronary dominance: Right  Left mainstem:   Large vessel that bifurcates into the LAD and left circumflex  Left anterior descending (LAD):   Large vessel that extends to the apical region, diagonal branch 2 of moderate size, moderate proximal and mid LAD disease.  Moderate ostial diagonal disease D1-D2  Left circumflex (LCx):  Large vessel with OM branch 2, prior stent mid vessel, occluded at this location, collaterals from left to left, right to left  Right coronary artery (RCA):  Right dominant vessel with PL and PDA, moderate to severe mid vessel disease 70%, 50% distal disease  Left ventriculography: Crossed for pressures, LV gram not performed  Final Conclusions:   Challenging access  given morbid obesity, weight 270 pounds Severe left circumflex disease occluded mid vessel, known from prior catheterization, collaterals Moderate to severe mid to distal RCA disease, unchanged from prior catheterization Moderate proximal and mid LAD disease Difficult groin access given morbid obesity, arterial accessed easily, Ultrasound used to visualize the vein, not well visualized apart from distal vessel but was too proximal to the artery to engage Grossly vein did not appear markedly dilated.  Recommendations:  Medical management recommended If right heart catheterization felt needed, would need to change access.  Neck not a appropriate choice given thick neck, obesity --Markedly elevated blood pressure during the  case Recommend aggressive blood pressure control, this is likely contributing to his shortness of breath -Also with morbid obesity, weight continues to trend upwards, recommend strict low carbohydrate diet Goal weight 200  LM lesion is 30% stenosed. Prox LAD lesion is 40% stenosed. Mid LAD lesion is 50% stenosed. Dist LAD lesion is 30% stenosed. Mid Cx lesion is 100% stenosed. Prox RCA lesion is 20% stenosed. Dist RCA lesion is 50% stenosed. Mid RCA-1 lesion is 10% stenosed. Mid RCA-2 lesion is 70% stenosed. RPAV lesion is 40% stenosed. 2nd Diag lesion is 50% stenosed. 1st Diag lesion is 45% stenosed.  Echo 08/06/20  1. Left ventricular ejection fraction, by estimation, is 55 to 60%. The  left ventricle has normal function. The left ventricle has no regional  wall motion abnormalities. Left ventricular diastolic parameters are  consistent with Grade I diastolic  dysfunction (impaired relaxation).   2. Right ventricular systolic function is normal. The right ventricular  size is normal.   3. Left atrial size was moderately dilated.   4. Challenging images   US renal artery 07/2020 Summary:  Renal:  Right: Normal size right kidney. 1-59% stenosis of the right renal         artery. Unable to visualize the Aorta and segments of the         Right Renal Artery.  Left:  Normal size of left kidney. Technically difficult due to         bowel Gas; unable to visualize the Renal Artery.      Assessment & Plan    Chronic diastolic heart failure s/p RHC  Pulmonary hypertension --Slight improvement reported in breathing status. Previous echo as above with moderately elevated pressures. Breathing status complicated by HFpEF, pulmonary HTN 2/2 ILD, BMI with likely OHS, as well as sedentary lifestyle / deconditioning. Diuresis complicated by renal function. [place] performed 12/06/20 with recommendation to increase to torsemide 3m daily and repeat a BMET in 1 week.  He reports that he has  held his diuresis over the last 2 days, given he felt dizzy and nauseated yesterday.  With holding his diuresis, he has felt improvement in symptoms.  He and his wife are concerned regarding his renal function, given a past history in which he was over diuresed and this resulted in AKI.  We will repeat BMET today with recommendation to restart diuresis if stable renal function in the setting of his elevated filling pressures as above during his catheterization.  We will also check a Mg.  Recommendation was that he monitor daily weights and BP, as well as oxygen saturation.  We did discuss that high/low BP could result in his symptoms, as well as various other etiologies of his symptoms, including glucose levels.  Of note, he did increase to 2 pills of his KCl 10 M EQ tablets (20 M EQ total) when increasing to torsemide 40 mg daily.  Will defer from referral to advanced heart failure clinic, though this could be considered in the future, though unclear if he would be a candidate for advanced therapies given fibrosis and morbid obesity.  Lifestyle changes discussed.  Coronary artery disease -- No chest pain. LHC with complete occlusion of left circumflex and nonobstructive disease as detailed above.  Continue ASA, Plavix, carvedilol, atorvastatin, Zetia, and Imdur.  Aggressive risk factor modification recommended. Cardiac rehab referral recommended if able to obtain to assist with increased activity given likely that BMI and deconditioning contributing to sx s previously mentioned.  HTN, goal BP less than 130/80 -- BP today 120/66 improved since increased diuresis. Continued on clonidine, Imdur, increased dose torsemide, hydralazine, Coreg.  CKD 3 -- Baseline Cr 2.5 and reportedly unchanged for 2 years.  We will recheck a BMET today as above and given his recent increase to torsemide 40 mg daily with KCl 20 M EQ total daily.  We will also check magnesium.  He does report nausea and dizziness since increasing  his diuretic, so we will want to ensure stable renal function and electrolytes.  He will start to monitor his pressure and let the office know if his BP is too  high or low at the time in which she has symptoms.  He will also start to monitor his weights.  Continue to follow with [facility].   DM2 -- Glycemic control recommended per PCP and for cardiovascular and renal risk factor modification.  Consider that abnormal glucose could contribute to his symptoms as reported above.  Hypothyroidism -- Continue treatment and monitoring per PCP.  His previous TSH was 6.29 with patient report that his medications have been adjusted since that time.  Consider recheck per PCP, as abnormal thyroid function could also contributed to her symptoms.  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  Reports increasing home O2 to 2-6L daily. Most recent PFTs as above.  Medication changes: None  Labs ordered: BMET, Mg (s/p increased diuresis to torsemide 40 mg daily and KCl tab 20 M EQ total daily) Studies / Imaging ordered: None Future considerations: [place] HF clinic if needed, cardiac rehab Disposition: RTC 4-6 months (if BMET/Mg acceptable, collected today, and pending results). He will monitor his BP at home and check it the next time he feels dizzy and nauseated.  Monitor daily weights.  *Please be aware that the above documentation was completed voice recognition software and may contain dictation errors.      Arvil Chaco, PA-C 12/13/2020

## 2020-12-14 ENCOUNTER — Telehealth: Payer: Self-pay | Admitting: Physician Assistant

## 2020-12-14 LAB — BASIC METABOLIC PANEL
BUN/Creatinine Ratio: 12 (ref 10–24)
BUN: 28 mg/dL — ABNORMAL HIGH (ref 8–27)
CO2: 25 mmol/L (ref 20–29)
Calcium: 8.3 mg/dL — ABNORMAL LOW (ref 8.6–10.2)
Chloride: 105 mmol/L (ref 96–106)
Creatinine, Ser: 2.33 mg/dL — ABNORMAL HIGH (ref 0.76–1.27)
Glucose: 48 mg/dL — ABNORMAL LOW (ref 65–99)
Potassium: 4 mmol/L (ref 3.5–5.2)
Sodium: 140 mmol/L (ref 134–144)
eGFR: 29 mL/min/{1.73_m2} — ABNORMAL LOW (ref >59)

## 2020-12-14 LAB — MAGNESIUM: Magnesium: 2.1 mg/dL (ref 1.6–2.3)

## 2020-12-14 MED ORDER — POTASSIUM CHLORIDE CRYS ER 10 MEQ PO TBCR: EXTENDED_RELEASE_TABLET | ORAL | 3 refills | Status: DC

## 2020-12-14 MED ORDER — TORSEMIDE 20 MG PO TABS: ORAL_TABLET | ORAL | 2 refills | Status: AC

## 2020-12-14 NOTE — Telephone Encounter (Signed)
I spoke with the patient regarding his lab results. I have also advised him of Marrianne Mood, PA's recommendations to: 1) Restart torsemide at 40 mg once daily  2) Restart potassium at 20 meq once daily  3) Follow up with his PCP regarding low blood sugars  The patient voices understanding of these results and recommendations and is agreeable.  He advised that he does have enough torsemide and potassium at home for now.  I have advised him to please all back with any questions/ concerns.  He again voices understanding.

## 2020-12-14 NOTE — Telephone Encounter (Signed)
Arvil Chaco, PA-C  12/14/2020  2:15 PM EDT      Labs show --Renal function stable from previous labs and improved from 13 days prior --Glucose low, which could have contributed to his symptoms --Calcium low as noted in prior labs --Magnesium WNL --Potassium at goal   Recommendations: He has been holding his torsemide 40 mg daily, given his concern for his kidney function.  Please have him restart his torsemide 40 mg daily and potassium supplementation with KCl tab 20 M EQ daily.  Please have him follow-up with his PCP regarding his low blood sugars.  Suspect that thisis in part contributing to some of his symptoms.

## 2020-12-22 ENCOUNTER — Ambulatory Visit: Payer: Medicare HMO | Admitting: Pulmonary Disease

## 2020-12-25 DIAGNOSIS — J449 Chronic obstructive pulmonary disease, unspecified: Secondary | ICD-10-CM | POA: Diagnosis not present

## 2020-12-27 ENCOUNTER — Telehealth: Payer: Self-pay

## 2020-12-27 NOTE — Chronic Care Management (AMB) (Addendum)
Chronic Care Management Pharmacy Assistant   Name: Stephen [name]  MRN: [id number] DOB: 1946/01/12   Reason for Encounter: Disease State  HTN    Recent office visits:  11/25/20 -  Dr.Duncan PCP - Reasonable to try Flonase with routine cautions and update me if needed.  Recent consult visits:  12/06/20 - [place] - cardiac cath procedure  12/01/20 - Cardiology [facility] procedure Labs ordered 11/11/20 - Cardiology Continue current torsemide regimen 40 alternating with 20, at the upper limits given renal dysfunction creatinine 2.5 10/29/20 - Cardiology no medication changes  Hospital visits: No admissions in 6 months  Medications: Outpatient Encounter Medications as of 12/27/2020  Medication Sig   ACCU-CHEK SOFTCLIX LANCETS lancets Use as instructed to check blood sugar three times daily and as needed.  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   albuterol (VENTOLIN HFA) 108 (90 Base) MCG/ACT inhaler INHALE 2 PUFFS INTO THE LUNGS EVERY 6 HOURS AS NEEDED FOR WHEEZING OR SHORTNESS OF BREATH. DISPENSE 3 INHALERS   Alcohol Swabs (B-D SINGLE USE SWABS REGULAR) PADS USE TO CLEANS AREA PRIOR TO CHECKING BLOOD SUGAR THREE TIMES DAILY AND AS NEEDED   aspirin EC 81 MG tablet Take 1 tablet (81 mg total) by mouth daily. Swallow whole.   atorvastatin (LIPITOR) 80 MG tablet TAKE 1 TABLET AT BEDTIME   Blood Glucose Monitoring Suppl (ACCU-CHEK AVIVA PLUS) w/Device KIT Use to check blood sugar three times daily and as needed.  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   dorzolamide-timolol (COSOPT) 22.3-6.8 MG/ML ophthalmic solution Place 1 drop into both eyes in the morning and at bedtime.   DROPLET INSULIN SYRINGE 30G X  5/16" 1 ML MISC USE AS DIRECTED TO INJECT INSULIN   ezetimibe (ZETIA) 10 MG tablet TAKE 1 TABLET EVERY DAY   fluticasone (FLONASE) 50 MCG/ACT nasal spray Place 2 sprays into both nostrils daily.   gabapentin (NEURONTIN) 400 MG capsule Take 2 tabs in the AM and 3 at night.   glucose blood (ACCU-CHEK AVIVA PLUS) test strip USE AS INSTRUCTED TO CHECK BLOOD SUGAR 3 TIMES DAILY OR AS NEEDED   hydrALAZINE (APRESOLINE) 50 MG tablet Take 1 tablet (50 mg total) by mouth 3 (three) times daily.   HYDROcodone-acetaminophen (NORCO/VICODIN) 5-325 MG tablet Take 1 tablet by mouth every 6 (six) hours as needed for moderate pain.   insulin NPH-regular Human (70-30) 100 UNIT/ML injection Inject 40-60 Units into the skin as directed. Take 60 units in the morning and 40 units in the evening   isosorbide mononitrate (IMDUR) 60 MG 24 hr tablet TAKE 1 AND 1/2 TABLETS TWICE DAILY   levothyroxine (SYNTHROID) 25 MCG tablet take 2 of the 25 mcg tablets Monday through Friday with 1 tablet on Saturday and Sunday.  12 pills/week.   levothyroxine (SYNTHROID) 50 MCG tablet TAKE 1 TABLET DAILY BEFORE BREAKFAST.   montelukast (SINGULAIR) 10 MG tablet Take 1 tablet (10 mg total) by mouth daily.   nitroGLYCERIN (NITROSTAT) 0.4 MG SL tablet Place 1 tablet (0.4 mg total) under the tongue every 5 (five) minutes as needed. May repeat x3   omeprazole (PRILOSEC) 20 MG capsule Take 20 mg by mouth daily before breakfast.  potassium chloride (KLOR-CON) 10 MEQ tablet Take 2 tablets (20 meq) by mouth once daily   Sennosides (EX-LAX) 15 MG CHEW Chew 15 mg by mouth 2 (two) times daily as needed (constipation).   sildenafil (REVATIO) 20 MG tablet Take 1 tablet (20 mg total) by mouth 3 (three) times daily.   tamsulosin (FLOMAX) 0.4 MG CAPS capsule TAKE 1 CAPSULE AT BEDTIME.   torsemide (DEMADEX) 20 MG tablet Take 2 tablets (40 mg) by mouth once daily   TRELEGY ELLIPTA 200-62.5-25 MCG/INH AEPB INHALE 1 PUFF ONCE DAILY   No facility-administered  encounter medications on file as of 12/27/2020.   Recent Office Vitals: BP Readings from Last 3 Encounters:  12/13/20 120/66  12/06/20 (!) 127/53  11/25/20 (!) 142/80   Pulse Readings from Last 3 Encounters:  12/13/20 60  12/06/20 (!) 49  11/25/20 (!) 59    Wt Readings from Last 3 Encounters:  12/13/20 264 lb (119.7 kg)  12/06/20 260 lb (117.9 kg)  11/25/20 264 lb (119.7 kg)     Kidney Function Lab Results  Component Value Date/Time   CREATININE 2.33 (H) 12/13/2020 11:51 AM   CREATININE 2.49 (H) 12/01/2020 02:02 PM   CREATININE 1.79 (H) 07/21/2013 10:07 AM   GFR 24.87 (L) 11/25/2020 10:53 AM   GFRNONAA 27 (L) 12/01/2020 02:02 PM   GFRNONAA 39 (L) 07/21/2013 10:07 AM   GFRAA 28 (L) 08/12/2020 11:20 AM   GFRAA 45 (L) 07/21/2013 10:07 AM    BMP Latest Ref Rng & Units 12/13/2020 12/01/2020 11/25/2020  Glucose 65 - 99 mg/dL 48(L) 76 83  BUN 8 - 27 mg/dL 28(H) 34(H) 30(H)  Creatinine 0.76 - 1.27 mg/dL 2.33(H) 2.49(H) 2.51(H)  BUN/Creat Ratio 10 - 24 12 - -  Sodium 134 - 144 mmol/L 140 142 143  Potassium 3.5 - 5.2 mmol/L 4.0 3.9 4.2  Chloride 96 - 106 mmol/L 105 109 109  CO2 20 - 29 mmol/L $RemoveB'25 24 28  'QweosoFu$ Calcium 8.6 - 10.2 mg/dL 8.3(L) 8.3(L) 8.1(L)   Current antihypertensive regimen:  Carvedilol 12.5 mg - 1 tablet twice daily with meals Clonidine 0.3 mg - 1 tablet three times daily Hydralazine $RemoveBeforeDEI'50mg'[id number]$  - 1 tablet three times daily Torsemide $RemoveBeforeD'20mg'bZxulRphLHtccV$  - 2 tablets daily  Isosorbide $RemoveBef'60mg'PzTtXadSuD$  - 1 1/2 tablets 2 times daily   Patient verbally confirms he is taking the above medications as directed. Yes  How often are you checking your Blood Pressure? infrequently the patient reports he has not been recording his BP. He will start to write down his readings   Current home BP readings: none available at this time  Wrist or arm cuff: arm cuff Caffeine intake: 1 cup of regular coffee in the morning Salt intake: does not add salt  OTC medications including pseudoephedrine or NSAIDs? no  Any  readings above 180/120? No   What recent interventions/DTPs have been made by any provider to improve Blood Pressure control since last CPP Visit: 12/13/20  Cardiology Recommendation was that he monitor daily weights and BP, as well as oxygen saturation.the patient reports he weighs 260 and is on oxygen 24/7   Any recent hospitalizations or ED visits since last visit with CPP? Yes  12/06/2020 [place]  ED  shortness of breath  What diet changes have been made to improve Blood Pressure Control? None identified  What exercise is being done to improve your Blood Pressure Control? None identified   Adherence Review: Is the patient currently on ACE/ARB medication? No Does the patient have >5 day gap  between last estimated fill dates? No   Star Rating Drugs:  Medication:  Last Fill: Day Supply Insulin Human 70-30  12/15/20 (Using [facility] for cheaper price) Atorvastatin 80mg  11/11/20  90  Follow-Up:  Pharmacist Review  Debbora Dus, CPP notified  Avel Sensor, [place] Assistant [phone]  I have reviewed the care management and care coordination activities outlined in this encounter and I am certifying that I agree with the content of this note. No further action required.  Debbora Dus, PharmD Clinical Pharmacist [facility] [phone]

## 2020-12-30 ENCOUNTER — Other Ambulatory Visit: Payer: Self-pay | Admitting: Pulmonary Disease

## 2021-01-05 ENCOUNTER — Telehealth: Payer: Self-pay | Admitting: Pulmonary Disease

## 2021-01-05 NOTE — Telephone Encounter (Signed)
Spoke to patient who is requesting generic trelegy. Patient is aware that Trelegy does not have a generic.  Recommended that patient contact insurance for a medication formulary. Patient will call back once formulary has been obtained.  Nothing further needed at this time.

## 2021-01-11 ENCOUNTER — Ambulatory Visit (INDEPENDENT_AMBULATORY_CARE_PROVIDER_SITE_OTHER): Payer: Medicare HMO

## 2021-01-11 DIAGNOSIS — Z0000 Encounter for general adult medical examination without abnormal findings: Secondary | ICD-10-CM | POA: Diagnosis not present

## 2021-01-11 NOTE — Patient Instructions (Signed)
Stephen [name] , Thank you for taking time to come for your Medicare Wellness Visit. I appreciate your ongoing commitment to your health goals. Please review the following plan we discussed and let me know if I can assist you in the future.   Screening recommendations/referrals: Colonoscopy: due, will discuss with provider  Recommended yearly ophthalmology/optometry visit for glaucoma screening and checkup Recommended yearly dental visit for hygiene and checkup  Vaccinations: Influenza vaccine: Up to date, completed 03/19/2020, due 01/2021 Pneumococcal vaccine: Completed series Tdap vaccine: decline-insurance  Shingles vaccine: due, check with your insurance regarding coverage if interested    Covid-19: completed 3 vaccines   Advanced directives: Advance directive discussed with you today. Even though you declined this today please call our office should you change your mind and we can give you the proper paperwork for you to fill out.  Conditions/risks identified: diabetes  Next appointment: Follow up in one year for your annual wellness visit.   Preventive Care 73 Years and Older, Male Preventive care refers to lifestyle choices and visits with your health care provider that can promote health and wellness. What does preventive care include? A yearly physical exam. This is also called an annual well check. Dental exams once or twice a year. Routine eye exams. Ask your health care provider how often you should have your eyes checked. Personal lifestyle choices, including: Daily care of your teeth and gums. Regular physical activity. Eating a healthy diet. Avoiding tobacco and drug use. Limiting alcohol use. Practicing safe sex. Taking low doses of aspirin every day. Taking vitamin and mineral supplements as recommended by your health care provider. What happens during an annual well check? The services and screenings done by your health care provider during your annual well check  will depend on your age, overall health, lifestyle risk factors, and family history of disease. Counseling  Your health care provider may ask you questions about your: Alcohol use. Tobacco use. Drug use. Emotional well-being. Home and relationship well-being. Sexual activity. Eating habits. History of falls. Memory and ability to understand (cognition). Work and work Statistician. Screening  You may have the following tests or measurements: Height, weight, and BMI. Blood pressure. Lipid and cholesterol levels. These may be checked every 5 years, or more frequently if you are over 47 years old. Skin check. Lung cancer screening. You may have this screening every year starting at age 102 if you have a 30-pack-year history of smoking and currently smoke or have quit within the past 15 years. Fecal occult blood test (FOBT) of the stool. You may have this test every year starting at age 64. Flexible sigmoidoscopy or colonoscopy. You may have a sigmoidoscopy every 5 years or a colonoscopy every 10 years starting at age 51. Prostate cancer screening. Recommendations will vary depending on your family history and other risks. Hepatitis C blood test. Hepatitis B blood test. Sexually transmitted disease (STD) testing. Diabetes screening. This is done by checking your blood sugar (glucose) after you have not eaten for a while (fasting). You may have this done every 1-3 years. Abdominal aortic aneurysm (AAA) screening. You may need this if you are a current or former smoker. Osteoporosis. You may be screened starting at age 26 if you are at high risk. Talk with your health care provider about your test results, treatment options, and if necessary, the need for more tests. Vaccines  Your health care provider may recommend certain vaccines, such as: Influenza vaccine. This is recommended every year. Tetanus, diphtheria, and  acellular pertussis (Tdap, Td) vaccine. You may need a Td booster every 10  years. Zoster vaccine. You may need this after age 30. Pneumococcal 13-valent conjugate (PCV13) vaccine. One dose is recommended after age 82. Pneumococcal polysaccharide (PPSV23) vaccine. One dose is recommended after age 45. Talk to your health care provider about which screenings and vaccines you need and how often you need them. This information is not intended to replace advice given to you by your health care provider. Make sure you discuss any questions you have with your health care provider. Document Released: 07/16/2015 Document Revised: 03/08/2016 Document Reviewed: 04/20/2015 Elsevier Interactive Patient Education  2017 [employer] Prevention in the Home Falls can cause injuries. They can happen to people of all ages. There are many things you can do to make your home safe and to help prevent falls. What can I do on the outside of my home? Regularly fix the edges of walkways and driveways and fix any cracks. Remove anything that might make you trip as you walk through a door, such as a raised step or threshold. Trim any bushes or trees on the path to your home. Use bright outdoor lighting. Clear any walking paths of anything that might make someone trip, such as rocks or tools. Regularly check to see if handrails are loose or broken. Make sure that both sides of any steps have handrails. Any raised decks and porches should have guardrails on the edges. Have any leaves, snow, or ice cleared regularly. Use sand or salt on walking paths during winter. Clean up any spills in your garage right away. This includes oil or grease spills. What can I do in the bathroom? Use night lights. Install grab bars by the toilet and in the tub and shower. Do not use towel bars as grab bars. Use non-skid mats or decals in the tub or shower. If you need to sit down in the shower, use a plastic, non-slip stool. Keep the floor dry. Clean up any water that spills on the floor as soon as it  happens. Remove soap buildup in the tub or shower regularly. Attach bath mats securely with double-sided non-slip rug tape. Do not have throw rugs and other things on the floor that can make you trip. What can I do in the bedroom? Use night lights. Make sure that you have a light by your bed that is easy to reach. Do not use any sheets or blankets that are too big for your bed. They should not hang down onto the floor. Have a firm chair that has side arms. You can use this for support while you get dressed. Do not have throw rugs and other things on the floor that can make you trip. What can I do in the kitchen? Clean up any spills right away. Avoid walking on wet floors. Keep items that you use a lot in easy-to-reach places. If you need to reach something above you, use a strong step stool that has a grab bar. Keep electrical cords out of the way. Do not use floor polish or wax that makes floors slippery. If you must use wax, use non-skid floor wax. Do not have throw rugs and other things on the floor that can make you trip. What can I do with my stairs? Do not leave any items on the stairs. Make sure that there are handrails on both sides of the stairs and use them. Fix handrails that are broken or loose. Make sure that  handrails are as long as the stairways. Check any carpeting to make sure that it is firmly attached to the stairs. Fix any carpet that is loose or worn. Avoid having throw rugs at the top or bottom of the stairs. If you do have throw rugs, attach them to the floor with carpet tape. Make sure that you have a light switch at the top of the stairs and the bottom of the stairs. If you do not have them, ask someone to add them for you. What else can I do to help prevent falls? Wear shoes that: Do not have high heels. Have rubber bottoms. Are comfortable and fit you well. Are closed at the toe. Do not wear sandals. If you use a stepladder: Make sure that it is fully opened.  Do not climb a closed stepladder. Make sure that both sides of the stepladder are locked into place. Ask someone to hold it for you, if possible. Clearly mark and make sure that you can see: Any grab bars or handrails. First and last steps. Where the edge of each step is. Use tools that help you move around (mobility aids) if they are needed. These include: Canes. Walkers. Scooters. Crutches. Turn on the lights when you go into a dark area. Replace any light bulbs as soon as they burn out. Set up your furniture so you have a clear path. Avoid moving your furniture around. If any of your floors are uneven, fix them. If there are any pets around you, be aware of where they are. Review your medicines with your doctor. Some medicines can make you feel dizzy. This can increase your chance of falling. Ask your doctor what other things that you can do to help prevent falls. This information is not intended to replace advice given to you by your health care provider. Make sure you discuss any questions you have with your health care provider. Document Released: 04/15/2009 Document Revised: 11/25/2015 Document Reviewed: 07/24/2014 Elsevier Interactive Patient Education  2017 [employer].

## 2021-01-11 NOTE — Progress Notes (Signed)
Subjective:   Stephen [name] is a 75 y.o. male who presents for Medicare Annual/Subsequent preventive examination.  Review of Systems: N/A      I connected with the patient today by telephone and verified that I am speaking with the correct person using two identifiers. Location patient: home Location nurse: work Persons participating in the telephone visit: patient, nurse.   I discussed the limitations, risks, security and privacy concerns of performing an evaluation and management service by telephone and the availability of in person appointments. I also discussed with the patient that there may be a patient responsible charge related to this service. The patient expressed understanding and verbally consented to this telephonic visit.        Cardiac Risk Factors include: advanced age (>32men, >73 women);male gender;diabetes mellitus     Objective:    Today's Vitals   There is no height or weight on file to calculate BMI.  Advanced Directives 01/11/2021 12/06/2020 02/26/2019 12/20/2018 11/21/2018 09/20/2018 09/20/2018  Does Patient Have a Medical Advance Directive? No No No No No No No  Would patient like information on creating a medical advance directive? No - Patient declined Yes (ED - Information included in AVS) No - Patient declined No - Patient declined (No Data) No - Patient declined -    Current Medications (verified) Outpatient Encounter Medications as of 01/11/2021  Medication Sig   ACCU-CHEK SOFTCLIX LANCETS lancets Use as instructed to check blood sugar three times daily and as needed.  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   albuterol (VENTOLIN HFA) 108 (90 Base) MCG/ACT inhaler INHALE 2 PUFFS INTO THE LUNGS EVERY 6 HOURS AS NEEDED FOR WHEEZING OR SHORTNESS OF BREATH. DISPENSE 3 INHALERS   Alcohol Swabs (B-D SINGLE USE SWABS REGULAR) PADS USE TO CLEANS AREA PRIOR TO CHECKING BLOOD SUGAR THREE TIMES DAILY AND AS NEEDED   aspirin EC 81 MG tablet Take 1 tablet (81 mg  total) by mouth daily. Swallow whole.   atorvastatin (LIPITOR) 80 MG tablet TAKE 1 TABLET AT BEDTIME   Blood Glucose Monitoring Suppl (ACCU-CHEK AVIVA PLUS) w/Device KIT Use to check blood sugar three times daily and as needed.  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   dorzolamide-timolol (COSOPT) 22.3-6.8 MG/ML ophthalmic solution Place 1 drop into both eyes in the morning and at bedtime.   DROPLET INSULIN SYRINGE 30G X 5/16" 1 ML MISC USE AS DIRECTED TO INJECT INSULIN   ezetimibe (ZETIA) 10 MG tablet TAKE 1 TABLET EVERY DAY   fluticasone (FLONASE) 50 MCG/ACT nasal spray Place 2 sprays into both nostrils daily.   gabapentin (NEURONTIN) 400 MG capsule Take 2 tabs in the AM and 3 at night.   glucose blood (ACCU-CHEK AVIVA PLUS) test strip USE AS INSTRUCTED TO CHECK BLOOD SUGAR 3 TIMES DAILY OR AS NEEDED   hydrALAZINE (APRESOLINE) 50 MG tablet Take 1 tablet (50 mg total) by mouth 3 (three) times daily.   HYDROcodone-acetaminophen (NORCO/VICODIN) 5-325 MG tablet Take 1 tablet by mouth every 6 (six) hours as needed for moderate pain.   insulin NPH-regular Human (70-30) 100 UNIT/ML injection Inject 40-60 Units into the skin as directed. Take 60 units in the morning and  40 units in the evening   isosorbide mononitrate (IMDUR) 60 MG 24 hr tablet TAKE 1 AND 1/2 TABLETS TWICE DAILY   levothyroxine (SYNTHROID) 25 MCG tablet take 2 of the 25 mcg tablets Monday through Friday with 1 tablet on Saturday and Sunday.  12 pills/week.   levothyroxine (SYNTHROID) 50 MCG tablet TAKE 1 TABLET DAILY BEFORE BREAKFAST.   nitroGLYCERIN (NITROSTAT) 0.4 MG SL tablet Place 1 tablet (0.4 mg total) under the tongue every 5 (five)  minutes as needed. May repeat x3   omeprazole (PRILOSEC) 20 MG capsule Take 20 mg by mouth daily before breakfast.   potassium chloride (KLOR-CON) 10 MEQ tablet Take 2 tablets (20 meq) by mouth once daily   Sennosides (EX-LAX) 15 MG CHEW Chew 15 mg by mouth 2 (two) times daily as needed (constipation).   sildenafil (REVATIO) 20 MG tablet Take 1 tablet (20 mg total) by mouth 3 (three) times daily.   tamsulosin (FLOMAX) 0.4 MG CAPS capsule TAKE 1 CAPSULE AT BEDTIME.   torsemide (DEMADEX) 20 MG tablet Take 2 tablets (40 mg) by mouth once daily   TRELEGY ELLIPTA 200-62.5-25 MCG/INH AEPB INHALE 1 PUFF ONCE DAILY   montelukast (SINGULAIR) 10 MG tablet Take 1 tablet (10 mg total) by mouth daily.   No facility-administered encounter medications on file as of 01/11/2021.    Allergies (verified) Calcium channel blockers   History: Past Medical History:  Diagnosis Date   Adrenal gland anomaly    Arthritis    CAD (coronary artery disease)    a. 04/2006 MI and PCI/stenting to mLCx & mRCA; b. 07/2009 Cath: patent LCX/RCA stents;  c. 12/2016 NSTEMI/Cath: LM 30, LAD 30p, 31m, 30d, D1/2/3 min irregs, LCX 141m ISR, OM1 min irregs, RCA 20p, 10 ISR, 77m, 50d, RPDA min irregs, RPAV 40-->Med Rx; d. 12/2017 MV: fixed lateral wall scar, mild anterior/anterior septal ischemia. EF 30-44%.   CHF (congestive heart failure) (HCC)    CKD (chronic kidney disease), stage III (HCC)    Colon polyps    DM2 (diabetes mellitus, type 2) (HCC)    insulin requiring   Dyslipidemia    GERD (gastroesophageal reflux disease)    History of echocardiogram    a. TTE 12/2016: EF 50-55%, mod concentric LVH, images inadequate for wall motion assessment, not technically sufficienct to allow for LV dias fxn, calcified mitral annulus    HTN (hypertension)    Kidney stones    Left Renal artery stenosis ([place])    a. 07/2013 s/p PTA/stenting ([place]); b. 07/2017 Renal Duplex: No significant RAS.   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   Recurrent UTI    Rotator cuff disorder    right   Skin cancer    head   Thyroid disorder    Venous insufficiency of both lower extremities    a. s/p laser treatment.   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   CARPAL TUNNEL RELEASE     bilateral   CATARACT EXTRACTION W/PHACO Right 07/08/2018   Procedure: CATARACT EXTRACTION PHACO AND INTRAOCULAR LENS PLACEMENT ([place]) RIGHT DIABETIC;  Surgeon: Eulogio Bear, MD;  Location: [facility];  Service: Ophthalmology;  Laterality: Right;  Diabetic - insulin sleep apnea   CATARACT EXTRACTION W/PHACO Left 08/05/2018   Procedure: CATARACT EXTRACTION PHACO AND INTRAOCULAR LENS  PLACEMENT ([place]) LEFT DIABETIC;  Surgeon: Eulogio Bear, MD;  Location: [facility];  Service: Ophthalmology;  Laterality: Left;  DIABETIC   CORONARY ANGIOGRAPHY N/A 08/19/2020   Procedure: CORONARY ANGIOGRAPHY;  Surgeon: Minna Merritts, MD;  Location: [facility];  Service: Cardiovascular;  Laterality: N/A;   CORONARY ANGIOPLASTY WITH STENT PLACEMENT  2007   x 2   KNEE SURGERY     right   LEFT HEART CATH AND CORONARY ANGIOGRAPHY N/A 01/11/2017   Procedure: Left Heart Cath and Coronary Angiography;  Surgeon: Wellington Hampshire, MD;  Location: [facility];  Service: Cardiovascular;  Laterality: N/A;   LUNG BIOPSY     RENAL ARTERY STENT  2015   L   RIGHT HEART CATH Right 12/06/2020   Procedure: RIGHT HEART CATH;  Surgeon: Wellington Hampshire, MD;  Location: [facility];  Service: Cardiovascular;  Laterality: Right;   SKIN CANCER EXCISION     back of head   Family History  Problem Relation Age of Onset   Heart attack Father    Heart attack Mother    Heart attack Brother    COPD Sister    Arthritis Maternal Grandmother    Breast cancer  Maternal Aunt    Diabetes Maternal Aunt    Hypertension Other    Hypertension Maternal Aunt    Prostate cancer Neg Hx    Kidney cancer Neg Hx    Bladder Cancer Neg Hx    Colon cancer Neg Hx    Social History   Socioeconomic History   Marital status: Married    Spouse name: Not on file   Number of children: 2   Years of education: Not on file   Highest education level: Not on file  Occupational History   Occupation: retired from service station work, 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    Comment: significant metal dust exposure, significant asbestos exposure    Employer: DISABLED  Tobacco Use   Smoking status: Never   Smokeless tobacco: Never  Vaping Use   Vaping Use: Never used  Substance and Sexual Activity   Alcohol use: No    Alcohol/week: 0.0 standard drinks   Drug use: No   Sexual activity: Yes  Other Topics Concern   Not on file  Social History Narrative   Enjoys fishing   Personnel officer   Social Determinants of Health   Financial Resource Strain: Low Risk    Difficulty of Paying Living Expenses: Not hard at all  Food Insecurity: No Food Insecurity   Worried About Charity fundraiser in the Last Year: Never true   Arboriculturist in the Last Year: Never true  Transportation Needs: No Transportation Needs   Lack of Transportation (Medical): No   Lack of Transportation (Non-Medical): No  Physical Activity: Inactive   Days of Exercise per Week: 0 days   Minutes of Exercise per Session: 0 min  Stress: No Stress Concern Present   Feeling of Stress : Not at all  Social Connections: Not on file    Tobacco Counseling Counseling given: Not Answered   Clinical Intake:  Pre-visit preparation completed: Yes  Pain : No/denies pain     Nutritional Risks: None Diabetes: Yes CBG done?: No Did pt. bring in CBG monitor from home?: No  How often do you need to have someone help you when you read instructions, pamphlets, or other written materials from your doctor or  pharmacy?: 1 - Never  Diabetic:  Yes Nutrition Risk Assessment:  Has the patient had any N/V/D within the last 2 months?  No  Does the patient have any non-healing wounds?  No  Has the patient had any unintentional weight loss or weight gain?  No   Diabetes:  Is the patient diabetic?  Yes  If diabetic, was a CBG obtained today?  No  telephone visit  Did the patient bring in their glucometer from home?  No  telephone visit  How often do you monitor your CBG's? Every other day.   Financial Strains and Diabetes Management:  Are you having any financial strains with the device, your supplies or your medication? No .  Does the patient want to be seen by Chronic Care Management for management of their diabetes?  No  Would the patient like to be referred to a Nutritionist or for Diabetic Management?  No   Diabetic Exams:  Diabetic Eye Exam: Completed 02/25/2020 Diabetic Foot Exam: Completed 03/19/2020   Interpreter Needed?: No  Information entered by :: CJohnson, RN   Activities of Daily Living In your present state of health, do you have any difficulty performing the following activities: 01/11/2021 12/06/2020  Hearing? N N  Vision? N N  Difficulty concentrating or making decisions? N N  Walking or climbing stairs? N Y  Comment - can climb them slowly  Dressing or bathing? N N  Comment - wife helps with placing support stockings as needed  Doing errands, shopping? N -  Preparing Food and eating ? N -  Using the Toilet? N -  In the past six months, have you accidently leaked urine? Y -  Comment wears pads -  Do you have problems with loss of bowel control? N -  Managing your Medications? Y -  Managing your Finances? N -  Housekeeping or managing your Housekeeping? N -  Some recent data might be hidden    Patient Care Team: [name], MD as PCP - General (Family Medicine) Minna Merritts, MD as PCP - Cardiology (Cardiology) Alisa Graff, FNP as Nurse  Practitioner (Family Medicine) Debbora Dus, [facility] as Pharmacist (Pharmacist) Debbora Dus, [facility] as Pharmacist (Pharmacist)  Indicate any recent Medical Services you may have received from other than [facility] providers in the past year (date may be approximate).     Assessment:   This is a routine wellness examination for Stephen [name].  Hearing/Vision screen Vision Screening - Comments:: Patient gets annual eye exams   Dietary issues and exercise activities discussed: Current Exercise Habits: The patient does not participate in regular exercise at present, Exercise limited by: None identified   Goals Addressed             This Visit's Progress    Patient Stated       01/11/2021, I will maintain and continue medications as prescribed.         Depression Screen PHQ 2/9 Scores 01/11/2021 07/26/2020 02/26/2019 12/20/2018 09/25/2018 12/21/2017 09/17/2017  PHQ - 2 Score 0 0 0 0 0 0 0  PHQ- 9 Score 0 - - 0 - - 0    Fall Risk Fall Risk  01/11/2021 07/26/2020 02/26/2019 12/20/2018 09/25/2018  Falls in the past year? 0 0 $R'1 1 1  'ih$ Comment - - - lost balance and fell through wall; subsequent hospitalization -  Number falls in past yr: 0 0 0 0 0  Injury with Fall? 0 0 $R'1 1 1  'eS$ Comment - - bruising - -  Risk for fall due  to : Medication side effect - History of fall(s);Impaired balance/gait;Impaired mobility History of fall(s);Impaired balance/gait;Impaired mobility History of fall(s)  Follow up Falls evaluation completed;Falls prevention discussed Falls evaluation completed Falls evaluation completed;Education provided;Falls prevention discussed - Falls prevention discussed    FALL RISK PREVENTION PERTAINING TO THE HOME:  Any stairs in or around the home? Yes  If so, are there any without handrails? No  Home free of loose throw rugs in walkways, pet beds, electrical cords, etc? Yes  Adequate lighting in your home to reduce risk of falls? Yes   ASSISTIVE DEVICES UTILIZED TO PREVENT FALLS:  Life  alert? No  Use of a cane, walker or w/c? No  Grab bars in the bathroom? No  Shower chair or bench in shower? No  Elevated toilet seat or a handicapped toilet? No   TIMED UP AND GO:  Was the test performed?  N/A telephone visit .    Cognitive Function: MMSE - Mini Mental State Exam 01/11/2021 12/20/2018 09/17/2017  Orientation to time $Remov'5 5 5  'MJxrHp$ Orientation to Place $Remove'5 5 5  'WdZCYwh$ Registration $Remov'3 3 3  'fTHEyP$ Attention/ Calculation 5 0 0  Recall $Remov'3 3 3  'gzLqly$ Language- name 2 objects - 0 0  Language- repeat $RemoveBeforeDE'1 1 1  'ntRFHpdlsuOrCHx$ Language- follow 3 step command - 0 3  Language- read & follow direction - 0 0  Write a sentence - 0 0  Copy design - 0 0  Total score - 17 20  Mini Cog  Mini-Cog screen was completed. Maximum score is 22. A value of 0 denotes this part of the MMSE was not completed or the patient failed this part of the Mini-Cog screening.       Immunizations Immunization History  Administered Date(s) Administered   Fluad Quad(high Dose 65+) 03/27/2019, 03/19/2020   Influenza Split 04/25/2011, 05/17/2012   Influenza,inj,Quad PF,6+ Mos 04/22/2013, 04/03/2014, 03/02/2015, 03/02/2016, 03/12/2017, 03/25/2018   Moderna Sars-Covid-2 Vaccination 09/01/2019, 09/29/2019, 04/25/2020   Pneumococcal Conjugate-13 10/26/2014   Pneumococcal Polysaccharide-23 06/01/2009, 09/15/2013    TDAP status: Due, Education has been provided regarding the importance of this vaccine. Advised may receive this vaccine at local pharmacy or Health Dept. Aware to provide a copy of the vaccination record if obtained from local pharmacy or Health Dept. Verbalized acceptance and understanding.  Flu Vaccine status: Up to date  Pneumococcal vaccine status: Up to date  Covid-19 vaccine status: Completed 3 vaccines  Qualifies for Shingles Vaccine? Yes   Zostavax completed No   Shingrix Completed?: No.    Education has been provided regarding the importance of this vaccine. Patient has been advised to call insurance company to determine out  of pocket expense if they have not yet received this vaccine. Advised may also receive vaccine at local pharmacy or Health Dept. Verbalized acceptance and understanding.  Screening Tests Health Maintenance  Topic Date Due   Zoster Vaccines- Shingrix (1 of 2) Never done   COLONOSCOPY (Pts 45-29yrs Insurance coverage will need to be confirmed)  09/03/2014   COVID-19 Vaccine (4 - Booster for Moderna series) 07/26/2020   TETANUS/TDAP  01/12/2024 (Originally 02/12/1965)   INFLUENZA VACCINE  01/31/2021   OPHTHALMOLOGY EXAM  02/24/2021   HEMOGLOBIN A1C  02/25/2021   FOOT EXAM  03/19/2021   Hepatitis C Screening  Completed   PNA vac Low Risk Adult  Completed   HPV VACCINES  Aged Out    Health Maintenance  Health Maintenance Due  Topic Date Due   Zoster Vaccines- Shingrix (1 of 2) Never  done   COLONOSCOPY (Pts 45-9yrs Insurance coverage will need to be confirmed)  09/03/2014   COVID-19 Vaccine (4 - Booster for Moderna series) 07/26/2020    Colorectal cancer screening: due, will discuss with provider  Lung Cancer Screening: (Low Dose CT Chest recommended if Age 64-80 years, 30 pack-year currently smoking OR have quit w/in 15years.) does not qualify.   Additional Screening:  Hepatitis C Screening: does qualify; Completed 08/20/2015  Vision Screening: Recommended annual ophthalmology exams for early detection of glaucoma and other disorders of the eye. Is the patient up to date with their annual eye exam?  Yes  Who is the provider or what is the name of the office in which the patient attends annual eye exams? Dr. Benay Pillow  If pt is not established with a provider, would they like to be referred to a provider to establish care? No .   Dental Screening: Recommended annual dental exams for proper oral hygiene  Community Resource Referral / Chronic Care Management: CRR required this visit?  No   CCM required this visit?  No      Plan:     I have personally reviewed and noted  the following in the patient's chart:   Medical and social history Use of alcohol, tobacco or illicit drugs  Current medications and supplements including opioid prescriptions. Patient is not currently taking opioid prescriptions. Functional ability and status Nutritional status Physical activity Advanced directives List of other physicians Hospitalizations, surgeries, and ER visits in previous 12 months Vitals Screenings to include cognitive, depression, and falls Referrals and appointments  In addition, I have reviewed and discussed with patient certain preventive protocols, quality metrics, and best practice recommendations. A written personalized care plan for preventive services as well as general preventive health recommendations were provided to patient.   Due to this being a telephonic visit, the after visit summary with patients personalized plan was offered to patient via office or my-chart.  Patient preferred to pick up at office at next visit or via mychart.   Andrez Grime, LPN   [id number]

## 2021-01-11 NOTE — Progress Notes (Signed)
PCP notes:  Health Maintenance: Colonoscopy- due Shingrix- due    Abnormal Screenings: none   Patient concerns: Shaking in right hand   Nurse concerns: none   Next PCP appt.: None

## 2021-01-12 ENCOUNTER — Telehealth: Payer: Self-pay | Admitting: Family Medicine

## 2021-01-12 ENCOUNTER — Other Ambulatory Visit: Payer: Self-pay | Admitting: Family Medicine

## 2021-01-12 NOTE — Telephone Encounter (Signed)
Please check with patient about scheduling a visit in about 2 months.  I saw the note from [place].  If he is having more trouble with tremor in the meantime, then let me know/get scheduled sooner.  Thanks.

## 2021-01-17 NOTE — Telephone Encounter (Signed)
Called patient voice mail has not been setup to leave message

## 2021-01-19 DIAGNOSIS — E113213 Type 2 diabetes mellitus with mild nonproliferative diabetic retinopathy with macular edema, bilateral: Secondary | ICD-10-CM | POA: Diagnosis not present

## 2021-01-24 DIAGNOSIS — J449 Chronic obstructive pulmonary disease, unspecified: Secondary | ICD-10-CM | POA: Diagnosis not present

## 2021-01-28 NOTE — Telephone Encounter (Signed)
Spoke with scheduled follow up appointment

## 2021-02-01 ENCOUNTER — Ambulatory Visit: Payer: Medicare HMO | Admitting: Pulmonary Disease

## 2021-02-03 ENCOUNTER — Telehealth: Payer: Self-pay

## 2021-02-03 NOTE — Chronic Care Management (AMB) (Addendum)
Chronic Care Management Pharmacy Assistant   Name: Stephen [name]  MRN: [id number] DOB: 1946-05-04  Reason for Encounter: CCM Reminder Call   Conditions to be addressed/monitored: CHF, CAD, HTN, HLD, DMII, and CKD Stage 3  Medications: Outpatient Encounter Medications as of 02/03/2021  Medication Sig   ACCU-CHEK SOFTCLIX LANCETS lancets Use as instructed to check blood sugar three times daily and as needed.  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   albuterol (VENTOLIN HFA) 108 (90 Base) MCG/ACT inhaler INHALE 2 PUFFS INTO THE LUNGS EVERY 6 HOURS AS NEEDED FOR WHEEZING OR SHORTNESS OF BREATH. DISPENSE 3 INHALERS   Alcohol Swabs (B-D SINGLE USE SWABS REGULAR) PADS USE TO CLEANS AREA PRIOR TO CHECKING BLOOD SUGAR THREE TIMES DAILY AND AS NEEDED   aspirin EC 81 MG tablet Take 1 tablet (81 mg total) by mouth daily. Swallow whole.   atorvastatin (LIPITOR) 80 MG tablet TAKE 1 TABLET AT BEDTIME   Blood Glucose Monitoring Suppl (ACCU-CHEK AVIVA PLUS) w/Device KIT Use to check blood sugar three times daily and as needed.  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   dorzolamide-timolol (COSOPT) 22.3-6.8 MG/ML ophthalmic solution Place 1 drop into both eyes in the morning and at bedtime.   DROPLET INSULIN SYRINGE 30G X 5/16" 1 ML MISC USE AS DIRECTED TO INJECT INSULIN   ezetimibe (ZETIA) 10 MG tablet TAKE 1 TABLET EVERY DAY   fluticasone (FLONASE) 50 MCG/ACT nasal spray Place 2 sprays into both nostrils daily.   gabapentin (NEURONTIN) 400 MG capsule Take 2 tabs in the AM and 3 at night.   glucose blood (ACCU-CHEK AVIVA PLUS) test strip USE AS INSTRUCTED TO CHECK BLOOD SUGAR 3 TIMES DAILY OR AS NEEDED    hydrALAZINE (APRESOLINE) 50 MG tablet Take 1 tablet (50 mg total) by mouth 3 (three) times daily.   HYDROcodone-acetaminophen (NORCO/VICODIN) 5-325 MG tablet Take 1 tablet by mouth every 6 (six) hours as needed for moderate pain.   insulin NPH-regular Human (70-30) 100 UNIT/ML injection Inject 40-60 Units into the skin as directed. Take 60 units in the morning and 40 units in the evening   isosorbide mononitrate (IMDUR) 60 MG 24 hr tablet TAKE 1 AND 1/2 TABLETS TWICE DAILY   levothyroxine (SYNTHROID) 25 MCG tablet take 2 of the 25 mcg tablets Monday through Friday with 1 tablet on Saturday and Sunday.  12 pills/week.   levothyroxine (SYNTHROID) 50 MCG tablet TAKE 1 TABLET DAILY BEFORE BREAKFAST.   montelukast (SINGULAIR) 10 MG tablet Take 1 tablet (10 mg total) by mouth daily.   nitroGLYCERIN (NITROSTAT) 0.4 MG SL tablet Place 1 tablet (0.4 mg total) under the tongue every 5 (five) minutes as needed. May repeat x3   omeprazole (PRILOSEC) 20 MG capsule Take 20 mg by mouth daily before breakfast.   potassium chloride (KLOR-CON) 10 MEQ tablet Take 2 tablets (20 meq) by mouth once daily   Sennosides (EX-LAX) 15 MG CHEW Chew 15 mg by mouth 2 (two) times daily as needed (constipation).   sildenafil (REVATIO) 20 MG tablet Take 1 tablet (20 mg total) by mouth 3 (three) times daily.   tamsulosin (FLOMAX) 0.4 MG CAPS capsule TAKE 1 CAPSULE AT BEDTIME  torsemide (DEMADEX) 20 MG tablet Take 2 tablets (40 mg) by mouth once daily   TRELEGY ELLIPTA 200-62.5-25 MCG/INH AEPB INHALE 1 PUFF ONCE DAILY   No facility-administered encounter medications on file as of 02/03/2021.   [name] did not answer the telephone to remind him of his upcoming telephone visit with Debbora Dus on 02/08/2021 at 1:00pm. No voicemail available.  Star Rating Drugs: Medication:  Last Fill: Day Supply Atorvastatin 80mg  01/18/21 [place], CPP notified  Avel Sensor, [place]  Assistant [phone]  I have reviewed the care management and care coordination activities outlined in this encounter and I am certifying that I agree with the content of this note. No further action required.  Debbora Dus, PharmD Clinical Pharmacist [facility] [phone]

## 2021-02-08 ENCOUNTER — Telehealth: Payer: Self-pay

## 2021-02-08 ENCOUNTER — Encounter: Payer: Self-pay | Admitting: Pulmonary Disease

## 2021-02-08 ENCOUNTER — Telehealth: Payer: Medicare HMO

## 2021-02-08 ENCOUNTER — Ambulatory Visit: Payer: Medicare HMO | Admitting: Pulmonary Disease

## 2021-02-08 ENCOUNTER — Other Ambulatory Visit: Payer: Self-pay

## 2021-02-08 VITALS — BP 120/68 | HR 59 | Temp 97.7°F | Ht 65.0 in | Wt 265.2 lb

## 2021-02-08 DIAGNOSIS — D631 Anemia in chronic kidney disease: Secondary | ICD-10-CM | POA: Diagnosis not present

## 2021-02-08 DIAGNOSIS — Z9989 Dependence on other enabling machines and devices: Secondary | ICD-10-CM

## 2021-02-08 DIAGNOSIS — I272 Pulmonary hypertension, unspecified: Secondary | ICD-10-CM

## 2021-02-08 DIAGNOSIS — N184 Chronic kidney disease, stage 4 (severe): Secondary | ICD-10-CM | POA: Diagnosis not present

## 2021-02-08 DIAGNOSIS — J849 Interstitial pulmonary disease, unspecified: Secondary | ICD-10-CM

## 2021-02-08 DIAGNOSIS — E662 Morbid (severe) obesity with alveolar hypoventilation: Secondary | ICD-10-CM | POA: Diagnosis not present

## 2021-02-08 DIAGNOSIS — I701 Atherosclerosis of renal artery: Secondary | ICD-10-CM | POA: Diagnosis not present

## 2021-02-08 DIAGNOSIS — G4733 Obstructive sleep apnea (adult) (pediatric): Secondary | ICD-10-CM

## 2021-02-08 DIAGNOSIS — I10 Essential (primary) hypertension: Secondary | ICD-10-CM | POA: Diagnosis not present

## 2021-02-08 DIAGNOSIS — N2581 Secondary hyperparathyroidism of renal origin: Secondary | ICD-10-CM | POA: Diagnosis not present

## 2021-02-08 DIAGNOSIS — J9611 Chronic respiratory failure with hypoxia: Secondary | ICD-10-CM

## 2021-02-08 DIAGNOSIS — J4540 Moderate persistent asthma, uncomplicated: Secondary | ICD-10-CM

## 2021-02-08 DIAGNOSIS — E1122 Type 2 diabetes mellitus with diabetic chronic kidney disease: Secondary | ICD-10-CM | POA: Diagnosis not present

## 2021-02-08 MED ORDER — TRELEGY ELLIPTA 200-62.5-25 MCG/INH IN AEPB: 1.0000 | INHALATION_SPRAY | Freq: Every day | RESPIRATORY_TRACT | 0 refills | Status: DC

## 2021-02-08 NOTE — Telephone Encounter (Signed)
   Chronic Care Management Pharmacy Note  02/08/2021 Name:  Stephen [name] MRN:  [id number] DOB:  04/30/1946  Subjective: Stephen [name] is an 75 y.o. year old male who is a primary patient of [name], Elveria Rising, MD.  The CCM team was consulted for assistance with disease management and care coordination needs.    Engaged with patient by telephone for follow up visit in response to provider referral for pharmacy case management and/or care coordination services. Patient was unavailable to review chronic health concerns today due to appt at 2 PM with kidney specialist. He would like to reschedule.  Recent office visits:  12/01/20 - Lab results - TSH is still high but closer to normal. Increase levothyroxine - take 2 of the 25 mcg tablets Monday through Friday with 1 tablet on Saturday and Sunday (12 tablets in a week) We can recheck a TSH in about 3 months. 11/25/20 -  Dr. [name], PCP - Pt presented for DM, cough, bruising, and chronic condition follow up. Reasonable to try Flonase for cough with routine cautions and update me if needed.   Recent consult visits:  02/08/21 - Pulmonary - Pt presented for asthma, ILD, OSA follow up. Trelegy samples provided. Start PAP. Follow up 3 months. 12/13/20 - [facility] - Pt presented for CHF, CAD follow up. Order BMET, Mg. Restart torsemide 40 mg daily and potassium 20 mEq daily.  Follow up with his PCP regarding low blood sugars. Follow up 4-6 months.  Hospital visits: 12/06/20 - Procedure - Right heart cath  -----------------------------------------------------------------------------------------  An unsuccessful telephone outreach was attempted today. The patient was referred to the pharmacist for assistance with care management and care coordination.   Chart reviewed in preparation for appt. Planned to discuss diabetes and review any hypoglycemia as this was reported an issue previously. Also planned to review home BP monitoring,CHF symptoms, daily  weights and diuretic use. Will follow up later this month.  Debbora Dus, PharmD Clinical Pharmacist [facility] [phone]

## 2021-02-08 NOTE — Progress Notes (Signed)
Subjective:    Patient ID: Stephen [name], male    DOB: 1946-03-18, 75 y.o.   MRN: [id number] Chief Complaint  Patient presents with   Follow-up    Patient wants to talk about his Trelegy inhaler, states he has shortness of breath all the time worse with exertion. Wears 4-5 liters oxygen all the time.     HPI Patient is a 75 year old lifelong never smoker with a very complex history as noted below, who follows here for various issues.  Last visit with me was on 06 October 2020 at that time we continued Trelegy Ellipta for an element of moderate persistent asthma.  Singulair was added due to rhinitis issues.  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  He has chronic lower extremity edema 2 to 3+.  He is working with cardiology and renal with regards to diuretic therapy.  Issues today are difficulties obtaining Trelegy due to cost.  We discussed medication assistance.  He does not offer any new symptomatology.  He has chronic shortness of breath.  His previous issues with rhinitis have improved with the Singulair.  We discussed his most recent pulmonary function testing and CT scan and reviewed the films with the patient.  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  In addition he had significant volume overload as well.  The patient is on sildenafil, most recently his right cardiac cath showed that his pulmonary hypertension is mild.  I would recommend discontinuation of sildenafil as this may be causing more issues with pulmonary edema and may worsen shunting.   Synopsis of his complex medical history as below: 1.  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 2.  Negative  connective tissue disease work-up May 2010 at [facility] by Dr. Thom Chimes. 3.  Chronic hypoxemic respiratory failure due to the above.  On 5 L/min nasal cannula O2. 4.  Labeled as COPD however this is an erroneous diagnosis see below. 5.  Obstructive sleep apnea on CPAP the patient believes at 16 cm H2O. 6.  Coronary artery disease status post PTCI 7.  Chronic diastolic heart failure,left ventricular ejection fraction 45 to 50% 3/20 8.  Pulmonary hypertension with elevated left ventricular end-diastolic pressure 9.  CKD III 10.  Diabetes mellitus type 2 11.  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  BMI 45  Most recent lab data: 10/13/2020 high resolution CT chest: Extensive groundglass airspace opacity and fibrosis.  Superimposed edema likely.  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  Small bilateral pleural effusions consistent with volume overload/edema, coronary artery disease 10/25/2020 PFT: Patient had great difficulty with the test data could not be interpreted reliably likely mild decline from prior. 12/06/2020 right heart cath: Mild to moderately elevated filling pressures, mild pulmonary hypertension and high cardiac output.  Review of Systems A 10 point review of systems was performed and it is as noted above otherwise negative.   Patient Active Problem List   Diagnosis Date Noted   Bruising 11/29/2020   Cough 11/29/2020   Unstable angina ([place]) 08/19/2020   Pulmonary hypertension, unspecified ([place]) 08/19/2020   Tremor 07/29/2020   Hypoxia 03/21/2020   Left leg cellulitis  09/16/2019   Asthma 03/31/2019   Renal artery stenosis ([place]) 02/24/2019   Advance care planning 12/26/2018   Hypothyroidism 12/26/2018   HLD (hyperlipidemia) 12/26/2018   Coronary artery disease involving native coronary artery of native heart with angina pectoris ([place]) 10/09/2018   Pulmonary hypertension,  primary ([place]) 09/26/2018   CHF (congestive heart failure) ([place]) 09/20/2018   Knee pain 03/26/2018   Encounter for chronic pain management 12/23/2017   Healthcare maintenance 09/21/2017   Skin lesion 06/15/2017   Acute on chronic renal failure ([place]) 01/15/2017   Coronary stent occlusion: Occluded circumflex stent 01/13/2017   Accelerated hypertension with diastolic congestive heart failure, NYHA class 3 ([place]); elevated LVEDP on chronic catheterization. 01/13/2017   NSTEMI (non-ST elevated myocardial infarction) ([place]) 01/11/2017   Blood in urine 12/08/2016   Lymphedema 10/23/2016   Low back pain with right-sided sciatica 04/03/2014   Muscle spasm 04/24/2013   Chronic edema 11/13/2012   Leg edema, right 09/29/2012   SK (seborrheic keratosis) 09/29/2012   Chronic diastolic heart failure ([place]) 12/15/2011   Sebaceous cyst 11/28/2011   Morbid obesity ([place]) 07/27/2011   Hyperlipidemia with target low density lipoprotein (LDL) cholesterol less than 70 mg/dL 04/26/2011   Adrenal adenoma 03/02/2011   Back pain 12/06/2010   Kidney stones 12/06/2010   Essential hypertension, benign 10/28/2010   PULMONARY FIBROSIS 09/15/2010   CAD (coronary artery disease) 09/01/2010   Type 2 diabetes mellitus with renal complication ([place]) 27/51/7001   METABOLIC SYNDROME X 74/94/4967   SLEEP APNEA, OBSTRUCTIVE 08/31/2010   Chronic kidney disease, stage III (moderate) ([place]) 08/31/2010   Social History   Tobacco Use   Smoking status: Never   Smokeless tobacco: Never  Substance Use Topics   Alcohol use: No    Alcohol/week: 0.0 standard drinks   Allergies  Allergen Reactions   Calcium Channel Blockers     Would avoid if possible due to h/o peripheral edema   Current Meds  Medication Sig   ACCU-CHEK SOFTCLIX LANCETS lancets Use as instructed to check blood sugar three times daily and as needed.  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   albuterol (VENTOLIN HFA) 108 (90 Base) MCG/ACT inhaler INHALE 2 PUFFS  INTO THE LUNGS EVERY 6 HOURS AS NEEDED FOR WHEEZING OR SHORTNESS OF BREATH. DISPENSE 3 INHALERS   Alcohol Swabs (B-D SINGLE USE SWABS REGULAR) PADS USE TO CLEANS AREA PRIOR TO CHECKING BLOOD SUGAR THREE TIMES DAILY AND AS NEEDED   aspirin EC 81 MG tablet Take 1 tablet (81 mg total) by mouth daily. Swallow whole.   atorvastatin (LIPITOR) 80 MG tablet TAKE 1 TABLET AT BEDTIME   Blood Glucose Monitoring Suppl (ACCU-CHEK AVIVA PLUS) w/Device KIT Use to check blood sugar three times daily and as needed.  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   dorzolamide-timolol (COSOPT) 22.3-6.8 MG/ML ophthalmic solution Place 1 drop into both eyes in the morning and at bedtime.   DROPLET INSULIN SYRINGE 30G X 5/16" 1 ML MISC USE AS DIRECTED TO INJECT INSULIN   ezetimibe (ZETIA) 10 MG tablet TAKE 1 TABLET EVERY DAY   fluticasone (FLONASE) 50 MCG/ACT nasal spray Place 2 sprays into both nostrils daily.   Fluticasone-Umeclidin-Vilant (TRELEGY ELLIPTA) 200-62.5-25  MCG/INH AEPB Inhale 1 puff into the lungs daily.   gabapentin (NEURONTIN) 400 MG capsule Take 2 tabs in the AM and 3 at night.   glucose blood (ACCU-CHEK AVIVA PLUS) test strip USE AS INSTRUCTED TO CHECK BLOOD SUGAR 3 TIMES DAILY OR AS NEEDED   hydrALAZINE (APRESOLINE) 50 MG tablet Take 1 tablet (50 mg total) by mouth 3 (three) times daily.   HYDROcodone-acetaminophen (NORCO/VICODIN) 5-325 MG tablet Take 1 tablet by mouth every 6 (six) hours as needed for moderate pain.   insulin NPH-regular Human (70-30) 100 UNIT/ML injection Inject 40-60 Units into the skin as directed. Take 60 units in the morning and 40 units in the evening   isosorbide mononitrate (IMDUR) 60  MG 24 hr tablet TAKE 1 AND 1/2 TABLETS TWICE DAILY   levothyroxine (SYNTHROID) 25 MCG tablet take 2 of the 25 mcg tablets Monday through Friday with 1 tablet on Saturday and Sunday.  12 pills/week.   levothyroxine (SYNTHROID) 50 MCG tablet TAKE 1 TABLET DAILY BEFORE BREAKFAST.   nitroGLYCERIN (NITROSTAT) 0.4 MG SL tablet Place 1 tablet (0.4 mg total) under the tongue every 5 (five) minutes as needed. May repeat x3   omeprazole (PRILOSEC) 20 MG capsule Take 20 mg by mouth daily before breakfast.   potassium chloride (KLOR-CON) 10 MEQ tablet Take 2 tablets (20 meq) by mouth once daily   Sennosides (EX-LAX) 15 MG CHEW Chew 15 mg by mouth 2 (two) times daily as needed (constipation).   sildenafil (REVATIO) 20 MG tablet Take 1 tablet (20 mg total) by mouth 3 (three) times daily.   tamsulosin (FLOMAX) 0.4 MG CAPS capsule TAKE 1 CAPSULE AT BEDTIME   torsemide (DEMADEX) 20 MG tablet Take 2 tablets (40 mg) by mouth once daily   Immunization History  Administered Date(s) Administered   Fluad Quad(high Dose 65+) 03/27/2019, 03/19/2020   Influenza Split 04/25/2011, 05/17/2012   Influenza,inj,Quad PF,6+ Mos 04/22/2013, 04/03/2014, 03/02/2015, 03/02/2016, 03/12/2017, 03/25/2018   Moderna Sars-Covid-2 Vaccination 09/01/2019, 09/29/2019, 04/25/2020   Pneumococcal Conjugate-13 10/26/2014   Pneumococcal Polysaccharide-23 06/01/2009, 09/15/2013       Objective:   Physical Exam BP 120/68 (BP Location: Left Arm, Patient Position: Sitting, Cuff Size: Large)   Pulse (!) 59   Temp 97.7 F (36.5 C) (Oral)   Ht $R'5\' 5"'FZ$  (1.651 m)   Wt 265 lb 3.2 oz (120.3 kg)   SpO2 98%   BMI 44.13 kg/m  GENERAL: Morbidly obese gentleman, no acute distress, comfortable on nasal cannula O2 (3 L/min pulsed) and fully ambulatory. HEAD: Normocephalic, atraumatic. EYES: Pupils equal, round, reactive to light.  No scleral icterus. MOUTH: Nose/mouth/throat not examined due to masking requirements for COVID 19. NECK: Supple. No  thyromegaly. Trachea midline. No JVD.  No adenopathy. PULMONARY: Good air entry bilaterally.  Decreased breath sounds on the right base (chronic right hemidiaphragm elevation) otherwise, lungs clear to auscultation bilaterally. CARDIOVASCULAR: S1 and S2.  Bradycardic rate and regular rhythm.  No overt rubs, murmurs or gallops heard. GASTROINTESTINAL: Protuberant, obese abdomen. MUSCULOSKELETAL: No joint deformity, no clubbing, 2-3+ edema to mid shin, wears TED hose. NEUROLOGIC: Awake, alert, no focal deficits noted.  No gait disturbance noted on ambulation.  Speech is fluent. SKIN: Intact,warm,dry. PSYCH: Flat affect, behavior normal.       Assessment & Plan:     ICD-10-CM   1. Moderate persistent asthma without complication  E70.35    Continue Trelegy Ellipta 200/62.5/25 dose Medication assistance forms Samples provided for the patient    2. ILD (interstitial lung  disease) ([place])  J84.9    Related to metal dust pneumoconiosis Has superimposed pulmonary edema on most recent CT    3. Chronic respiratory failure with hypoxia (HCC)  J96.11    Continue oxygen at 4 L/min    4. Pulmonary HTN (HCC)  I27.20    Mild by recent right heart cath Highly recommend discontinuing sildenafil Sildenafil could be causing more issues with edema    5. OSA on CPAP  G47.33    Z99.89    Patient claims compliance, continue    6. Extreme obesity with alveolar hypoventilation (HCC)  E66.2    Needs weight loss     Meds ordered this encounter  Medications   Fluticasone-Umeclidin-Vilant (TRELEGY ELLIPTA) 200-62.5-25 MCG/INH AEPB    Sig: Inhale 1 puff into the lungs daily.    Dispense:  28 each    Refill:  0    Order Specific Question:   Lot Number?    Answer:   [id number]    Order Specific Question:   Expiration Date?    Answer:   08/03/2022    Order Specific Question:   Manufacturer?    Answer:   [employer] [[id number]]    Order Specific Question:   Quantity    Answer:   2    The patient appears to be  relatively stable.  Continue to recommend discontinuation of sildenafil.  He has significant polypharmacy and I do not believe that this medication is really helping him and may actually be aggravating his issues with edema and intrapulmonary shunting.  We provided the patient with Trelegy samples as well as help with medication assistance form to help him obtain this medication for his moderate persistent asthma.  We will see him in follow-up in 3 months time he is to call sooner should any new problems arise.   Renold Don, MD Advanced Bronchoscopy PCCM [facility]    *This note was dictated using voice recognition software/Dragon.  Despite best efforts to proofread, errors can occur which can change the meaning.  Any change was purely unintentional.

## 2021-02-08 NOTE — Progress Notes (Deleted)
Chronic Care Management Pharmacy Note  02/08/2021 Name:  Stephen [name] MRN:  [id number] DOB:  [birth year]  Subjective: Stephen [name] is an 75 y.o. year old male who is a primary patient of [name], Elveria Rising, MD.  The CCM team was consulted for assistance with disease management and care coordination needs.    Engaged with patient by telephone for follow up visit in response to provider referral for pharmacy case management and/or care coordination services.   Consent to Services:  The patient was given information about Chronic Care Management services, agreed to services, and gave verbal consent prior to initiation of services.  Please see initial visit note for detailed documentation.   Patient Care Team: [name], MD as PCP - General (Family Medicine) Rockey Situ Kathlene November, MD as PCP - Cardiology (Cardiology) Alisa Graff, FNP as Nurse Practitioner (Family Medicine) Debbora Dus, [facility] as Pharmacist (Pharmacist) Debbora Dus, [facility] as Pharmacist (Pharmacist)  Recent office visits:  12/01/20 - Lab results - TSH is still high but closer to normal. Increase levothyroxine - take 2 of the 25 mcg tablets Monday through Friday with 1 tablet on Saturday and Sunday (12 tablets in a week) We can recheck a TSH in about 3 months. 11/25/20 -  Dr. [name], PCP - Pt presented for DM, cough, bruising, and chronic condition follow up. Reasonable to try Flonase for cough with routine cautions and update me if needed.   Recent consult visits:  02/08/21 - Pulmonary - Pt presented for asthma, ILD, OSA follow up. Trelegy samples provided. Start PAP. Follow up 3 months. 12/13/20 - [facility] - Pt presented for CHF, CAD follow up. Order BMET, Mg. Restart torsemide 40 mg daily and potassium 20 mEq daily.  Follow up with his PCP regarding low blood sugars. Follow up 4-6 months.  Hospital visits: 12/06/20 - Procedure - Right heart cath  Objective:  Lab Results  Component Value Date   CREATININE  2.33 (H) 12/13/2020   BUN 28 (H) 12/13/2020   GFR 24.87 (L) 11/25/2020   GFRNONAA 27 (L) 12/01/2020   GFRAA 28 (L) 08/12/2020   NA 140 12/13/2020   K 4.0 12/13/2020   CALCIUM 8.3 (L) 12/13/2020   CO2 25 12/13/2020    Lab Results  Component Value Date/Time   HGBA1C 6.4 11/25/2020 10:53 AM   HGBA1C 6.9 (H) 07/12/2020 09:34 AM   GFR 24.87 (L) 11/25/2020 10:53 AM   GFR 30.42 (L) 07/01/2019 11:19 AM   MICROALBUR 221.2 Repeated and verified X2. (H) 09/15/2013 10:09 AM    Last diabetic Eye exam:  Lab Results  Component Value Date/Time   HMDIABEYEEXA Retinopathy (A) 02/25/2020 12:00 AM    Last diabetic Foot exam: 03/19/20  Lab Results  Component Value Date   CHOL 125 12/23/2018   HDL 27.70 (L) 12/23/2018   LDLCALC 69 12/23/2018   TRIG 142.0 12/23/2018   CHOLHDL 5 12/23/2018    Hepatic Function Latest Ref Rng & Units 12/01/2020 12/23/2018 09/20/2018  Total Protein 6.5 - 8.1 g/dL 6.3(L) 5.7(L) 6.5  Albumin 3.5 - 5.0 g/dL 2.7(L) 3.1(L) 2.7(L)  AST 15 - 41 U/L _0 ALT 0 - 44 U/L _1 Alk Phosphatase 38 - 126 U/L 78 86 81  Total Bilirubin 0.3 - 1.2 mg/dL 0.5 0.5 0.6    Lab Results  Component Value Date/Time   TSH 6.29 (H) 11/25/2020 10:53 AM   TSH 7.42 (H) 07/12/2020 09:34 AM    CBC Latest Ref  Rng & Units 12/01/2020 11/25/2020 08/12/2020  WBC 4.0 - 10.5 K/uL 7.6 7.3 7.4  Hemoglobin 13.0 - 17.0 g/dL 11.8(L) 11.9(L) 13.5  Hematocrit 39.0 - 52.0 % 36.4(L) 35.9(L) 40.8  Platelets 150 - 400 K/uL 169 169.0 195   No results found for: VD25OH  Clinical ASCVD: Yes  The ASCVD Risk score Mikey Bussing DC Jr., et al., 2013) failed to calculate for the following reasons:   The patient has a prior MI or stroke diagnosis    Depression screen [facility] 2/9 01/11/2021 07/26/2020 02/26/2019  Decreased Interest 0 0 0  Down, Depressed, Hopeless 0 0 0  PHQ - 2 Score 0 0 0  Altered sleeping 0 - -  Tired, decreased energy 0 - -  Change in appetite 0 - -  Feeling bad or failure about yourself  0 - -   Trouble concentrating 0 - -  Moving slowly or fidgety/restless 0 - -  Suicidal thoughts 0 - -  PHQ-9 Score 0 - -  Difficult doing work/chores Not difficult at all - -  Some recent data might be hidden    Echo - 08/06/20 showed LVEF 55-60%  Social History   Tobacco Use  Smoking Status Never  Smokeless Tobacco Never   BP Readings from Last 3 Encounters:  02/08/21 120/68  12/13/20 120/66  12/06/20 (!) 127/53   Pulse Readings from Last 3 Encounters:  02/08/21 (!) 59  12/13/20 60  12/06/20 (!) 49   Wt Readings from Last 3 Encounters:  02/08/21 265 lb 3.2 oz (120.3 kg)  12/13/20 264 lb (119.7 kg)  12/06/20 260 lb (117.9 kg)    Assessment/Interventions: Review of patient past medical history, allergies, medications, health status, including review of consultants reports, laboratory and other test data, was performed as part of comprehensive evaluation and provision of chronic care management services.   SDOH:  (Social Determinants of Health) assessments and interventions performed: Yes    CCM Care Plan  Allergies  Allergen Reactions   Calcium Channel Blockers     Would avoid if possible due to h/o peripheral edema    Medications Reviewed Today     Reviewed by Colon Branch, CMA (Certified Medical Assistant) on 02/08/21 at 104  Med List Status: <None>   Medication Order Taking? Sig Documenting Provider Last Dose Status Informant  ACCU-CHEK SOFTCLIX LANCETS lancets [id number] Yes Use as instructed to check blood sugar three times daily and as needed.  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 [name], MD Taking Active Spouse/Significant Other  albuterol (VENTOLIN HFA) 108 (90 Base) MCG/ACT inhaler [id number] Yes INHALE 2 PUFFS INTO THE LUNGS EVERY 6 HOURS AS NEEDED FOR WHEEZING OR SHORTNESS OF BREATH. DISPENSE 3 INHALERS [name], MD Taking Active   Alcohol Swabs (B-D SINGLE USE SWABS REGULAR) PADS [id number] Yes USE TO CLEANS AREA PRIOR TO CHECKING BLOOD SUGAR  THREE TIMES DAILY AND AS NEEDED [name], MD Taking Active Spouse/Significant Other  aspirin EC 81 MG tablet [id number] Yes Take 1 tablet (81 mg total) by mouth daily. Swallow whole. [name], MD Taking Active   atorvastatin (LIPITOR) 80 MG tablet [id number] Yes TAKE 1 TABLET AT BEDTIME [name], MD Taking Active   Blood Glucose Monitoring Suppl (ACCU-CHEK AVIVA PLUS) w/Device KIT [id number] Yes Use to check blood sugar three times daily and as needed.  Diagnosis: E11.22  Insulin-dependent [name], MD Taking Active Spouse/Significant Other  carvedilol (COREG) 12.5 MG tablet [id number] Yes TAKE 1 TABLET TWO TIMES DAILY WITH  MEALS [name], MD Taking Active   cloNIDine (CATAPRES) 0.3 MG tablet [id number] Yes TAKE 1 TABLET THREE TIMES DAILY Rockey Situ Kathlene November, MD Taking Active   clopidogrel (PLAVIX) 75 MG tablet [id number] Yes TAKE 1 TABLET EVERY DAY Loel Dubonnet, NP Taking Active   diphenhydrAMINE (BENADRYL) 50 MG tablet [id number] Yes Take 50 mg by mouth at bedtime as needed for allergies. [provider] Taking Active Spouse/Significant Other           Med Note Tamala Julian, Delfino Lovett   Fri Sep 20, 2018  9:40 AM)    dorzolamide-timolol (COSOPT) 22.3-6.8 MG/ML ophthalmic solution [id number] Yes Place 1 drop into both eyes in the morning and at bedtime. [provider] Taking Active Spouse/Significant Other  DROPLET INSULIN SYRINGE 30G X 5/16" 1 ML MISC [id number] Yes USE AS DIRECTED TO INJECT INSULIN [name], MD Taking Active   ezetimibe (ZETIA) 10 MG tablet [id number] Yes TAKE 1 TABLET EVERY DAY Gollan, Kathlene November, MD Taking Active   fluticasone (FLONASE) 50 MCG/ACT nasal spray [id number] Yes Place 2 sprays into both nostrils daily. [name], MD Taking Active   gabapentin (NEURONTIN) 400 MG capsule [id number] Yes Take 2 tabs in the AM and 3 at night. [name], MD Taking Active   glucose blood (ACCU-CHEK AVIVA PLUS) test strip [id number]  Yes USE AS INSTRUCTED TO CHECK BLOOD SUGAR 3 TIMES DAILY OR AS NEEDED [name], MD Taking Active Spouse/Significant Other  hydrALAZINE (APRESOLINE) 50 MG tablet [id number] Yes Take 1 tablet (50 mg total) by mouth 3 (three) times daily. Marrianne Mood D, PA-C Taking Active   HYDROcodone-acetaminophen (NORCO/VICODIN) 5-325 MG tablet [id number] Yes Take 1 tablet by mouth every 6 (six) hours as needed for moderate pain. [name], MD Taking Active Spouse/Significant Other  insulin NPH-regular Human (70-30) 100 UNIT/ML injection [id number] Yes Inject 40-60 Units into the skin as directed. Take 60 units in the morning and 40 units in the evening [provider] Taking Active   isosorbide mononitrate (IMDUR) 60 MG 24 hr tablet [id number] Yes TAKE 1 AND 1/2 TABLETS TWICE DAILY Gollan, Kathlene November, MD Taking Active   levothyroxine (SYNTHROID) 25 MCG tablet [id number] Yes take 2 of the 25 mcg tablets Monday through Friday with 1 tablet on Saturday and Sunday.  12 pills/week. [name], MD Taking Active   levothyroxine (SYNTHROID) 50 MCG tablet [id number] Yes TAKE 1 TABLET DAILY BEFORE BREAKFAST. [name], MD Taking Active   montelukast (SINGULAIR) 10 MG tablet [id number]  Take 1 tablet (10 mg total) by mouth daily. Tyler Pita, MD  Expired 01/04/21 2359   nitroGLYCERIN (NITROSTAT) 0.4 MG SL tablet [id number] Yes Place 1 tablet (0.4 mg total) under the tongue every 5 (five) minutes as needed. May repeat x3 [name], MD Taking Active   omeprazole (PRILOSEC) 20 MG capsule [id number] Yes Take 20 mg by mouth daily before breakfast. [provider] Taking Active Spouse/Significant Other  potassium chloride (KLOR-CON) 10 MEQ tablet [id number] Yes Take 2 tablets (20 meq) by mouth once daily Mickle Plumb, Jacquelyn D, PA-C Taking Active   Sennosides (EX-LAX) 15 MG CHEW [id number] Yes Chew 15 mg by mouth 2 (two) times daily as needed (constipation). [provider]  Taking Active Spouse/Significant Other  sildenafil (REVATIO) 20 MG tablet [id number] Yes Take 1 tablet (20 mg total) by mouth 3 (three) times daily. Minna Merritts, MD Taking Active Spouse/Significant Other  Med Note [facility] Graylin Shiver   Mon Dec 13, 2020 10:56 AM)    tamsulosin (FLOMAX) 0.4 MG CAPS capsule [id number] Yes TAKE 1 CAPSULE AT BEDTIME [name], MD Taking Active   torsemide (DEMADEX) 20 MG tablet [id number] Yes Take 2 tablets (40 mg) by mouth once daily Magda Bernheim Taking Active   Donnal Debar 200-62.5-25 MCG/INH AEPB [id number] Yes INHALE 1 PUFF ONCE DAILY Tyler Pita, MD Taking Active             Patient Active Problem List   Diagnosis Date Noted   Bruising 11/29/2020   Cough 11/29/2020   Unstable angina ([place]) 08/19/2020   Pulmonary hypertension, unspecified ([place]) 08/19/2020   Tremor 07/29/2020   Hypoxia 03/21/2020   Left leg cellulitis 09/16/2019   Asthma 03/31/2019   Renal artery stenosis ([place]) 02/24/2019   Advance care planning 12/26/2018   Hypothyroidism 12/26/2018   HLD (hyperlipidemia) 12/26/2018   Coronary artery disease involving native coronary artery of native heart with angina pectoris ([place]) 10/09/2018   Pulmonary hypertension, primary ([employer]) 09/26/2018   CHF (congestive heart failure) ([place]) 09/20/2018   Knee pain 03/26/2018   Encounter for chronic pain management 12/23/2017   Healthcare maintenance 09/21/2017   Skin lesion 06/15/2017   Acute on chronic renal failure ([place]) 01/15/2017   Coronary stent occlusion: Occluded circumflex stent 01/13/2017   Accelerated hypertension with diastolic congestive heart failure, NYHA class 3 ([place]); elevated LVEDP on chronic catheterization. 01/13/2017   NSTEMI (non-ST elevated myocardial infarction) ([place]) 01/11/2017   Blood in urine 12/08/2016   Lymphedema 10/23/2016   Low back pain with right-sided sciatica 04/03/2014   Muscle spasm 04/24/2013   Chronic edema  11/13/2012   Leg edema, right 09/29/2012   SK (seborrheic keratosis) 09/29/2012   Chronic diastolic heart failure ([place]) 12/15/2011   Sebaceous cyst 11/28/2011   Morbid obesity ([place]) 07/27/2011   Hyperlipidemia with target low density lipoprotein (LDL) cholesterol less than 70 mg/dL 04/26/2011   Adrenal adenoma 03/02/2011   Back pain 12/06/2010   Kidney stones 12/06/2010   Essential hypertension, benign 10/28/2010   PULMONARY FIBROSIS 09/15/2010   CAD (coronary artery disease) 09/01/2010   Type 2 diabetes mellitus with renal complication ([place]) 83/25/4982   METABOLIC SYNDROME X 64/15/8309   SLEEP APNEA, OBSTRUCTIVE 08/31/2010   Chronic kidney disease, stage III (moderate) ([place]) 08/31/2010    Immunization History  Administered Date(s) Administered   Fluad Quad(high Dose 65+) 03/27/2019, 03/19/2020   Influenza Split 04/25/2011, 05/17/2012   Influenza,inj,Quad PF,6+ Mos 04/22/2013, 04/03/2014, 03/02/2015, 03/02/2016, 03/12/2017, 03/25/2018   Moderna Sars-Covid-2 Vaccination 09/01/2019, 09/29/2019, 04/25/2020   Pneumococcal Conjugate-13 10/26/2014   Pneumococcal Polysaccharide-23 06/01/2009, 09/15/2013    Conditions to be addressed/monitored:  Hypertension, Hyperlipidemia, Diabetes, Heart Failure and Hypothyroidism  There are no care plans that you recently modified to display for this patient.   Current Barriers: Not routinely monitoring blood pressure at home.   Pharmacist Clinical Goal(s):  Over the next 30 days, patient will maintain control of blood pressure as evidenced by home readings within goal of less than 140/90 mmHg  through collaboration with PharmD and provider.   Interventions: 1:1 collaboration with [name], MD regarding development and update of comprehensive plan of care as evidenced by provider attestation and co-signature Inter-disciplinary care team collaboration (see longitudinal plan of care) Comprehensive medication review performed; medication  list updated in electronic medical record   Hypertension (BP goal <140/90 mmHg) -Controlled - per clinic readings -Current treatment: Carvedilol 12.5 mg -  1 tablet twice daily with meals Clonidine 0.3 mg - 1 tablet three times daily -Medications previously tried: amlodipine, hctz/valsartan - note nephrology reports pt was taking 05/2020 but no refill hisotry in past year and pt confirms he is not taking this -Current home readings: doesn't check often but has an arm cuff monitor if needed -Current dietary habits: tries to limit salt intake, does not use table salt -Denies hypotensive/hypertensive symptoms -Educated on Importance of home blood pressure monitoring; -Counseled to monitor BP at home several days this month, document, and provide log at future appointments -Recommended home BP monitoring for next several weeks. CMA will call patient to collect log early March.   Hyperlipidemia: (LDL goal < 70) -Unable to assess. Most recent lipids from 12/2018 within goal. Pt reports kidney specialist checks cholesterol at every visit. -Current treatment: Atorvastatin 80 mg - 1 tablet daily Ezetimibe 10 mg - 1 tablet daily -Medications previously tried: none -Educated on Importance of daily adherence -Recommended to continue current medication   Diabetes (A1c goal <7%) -Controlled -Current medications: Insulin 70/30 (NPH/regular) - Inject 70 units before breakfast and 40-50 units before evening meal (reports taking 70 in AM and 45-50 at night, depending on how high his before supper BG is) -Medications previously tried: none -Current home glucose readings: checking twice daily, meter not available today but agrees my assistant may call back to get these Fasting glucose: reports from memory range of 70-130 Pre-meal: reports from memory range of 125-130 (before supper) --> takes 45 units of NPH unless BG > 150, then will take 50 units NPH, denies any BG > 200 Denies any BG < 70, but fasting  BG is in 70s at times -Denies hypoglycemic symptoms. Confirms hyperglycemic symptoms in last several months - reports some blurry vision on occasion, but discussed unlikely due to high BG as his readings are < 200. -Current meal patterns: "kind of watching what he eats" - reports trying to avoid sweets/desserts, potatoes snacks: cookies - store bought, just has these if he feels a low sugar is coming on. Reports this is infrequent. drinks: drinks diet sodas and water only -Current exercise: gets outside, yard work -[employer] and blood sugar goals; Benefits of routine self-monitoring of blood sugar; -Counseled to check feet daily and get yearly eye exams -Recommended CMA to follow up on BG log next month (early March) to ensure he is not having frequent lows.   Hypothyroidism (Goal: TSH within normal range) -uncontrolled - recent dose adjustment 11/25/20 -Current treatment Levothyroxine 25 mcg - 1 tablet daily except take 2 on MWF Recent medication dose increase 07/26/20. Pt confirms increased dosing. TSH scheduled. Discussed administration. Pt confirms taking before other medications in the morning. -Recommend continuing current medications; Coordinate updating prescription with current dosing.   Heart Failure (Goal: control symptoms and prevent exacerbations) Controlled Type: Diastolic -Ejection fraction: 55-60% -Current treatment: Carvedilol 12.5 mg - 1 tablet twice daily Torsemide 20 mg - Alternate 20 mg and 40 mg every other day. Take an additional 20 mg tablet for weight gain > 2 lbs overnight or > 5 lbs in 1 week. -Medications previously tried: No longer on potassium or ACE-inhibitor -Current home BP/HR readings: none, not checking -Current dietary habits: tries to limit table salt -Current exercise routine: none formal but tries to go outside and do [employer] -Educated on Importance of weighing daily; if you gain more than 3 pounds in one day or 5 pounds in one week,  take an additional torsemide 20 mg  Importance of blood pressure control -Recommended to continue current medication; Recommend weighing daily to take torsemide appropriately.   Patient Goals/Self-Care Activities Over the next 30 days, patient will: - check blood pressure 3-5 times, document, and provide at future appointments  Medication Assistance: None required.  Patient affirms current coverage meets needs.   Star Rating Drugs: Medication:                Last Fill:         Day Supply Atorvastatin 60m       01/18/21            90   Patient[facility]:  [facility] [place] [id number]: [phone]: [phone] [facility] [place]Phone: [phone]: [phone] Uses pill box? No - takes straight from pill bottle, keeps them in a bread box in a kitchen  Pt endorses 100% compliance, denies any missed doses.   Pharmacy/Benefits:[facility]/Mail order - maintenance meds/synced; [facility] - Insulin, Trelegy; [facility]- Sildenafil  Care Plan and Follow Up Patient Decision:  Patient agrees to Care Plan and Follow-up.    MDebbora Dus PharmD Clinical Pharmacist [facility]Primary [facility] at [phone]

## 2021-02-08 NOTE — Patient Instructions (Signed)
We are going to give you some samples of Trelegy.  We will give you paperwork for the medication assistance.  Continue using your oxygen as you are doing.  Your oxygen level was very good today on the continuous flow of oxygen.  We will see you in follow-up in 3 months time call sooner should any new problems arise.

## 2021-02-10 ENCOUNTER — Other Ambulatory Visit: Payer: Self-pay

## 2021-02-10 ENCOUNTER — Ambulatory Visit
Admission: RE | Admit: 2021-02-10 | Discharge: 2021-02-10 | Disposition: A | Discharge status: Home / Self Care | Payer: Medicare HMO | Source: Ambulatory Visit | Attending: Nephrology | Admitting: Nephrology

## 2021-02-10 DIAGNOSIS — R600 Localized edema: Secondary | ICD-10-CM | POA: Insufficient documentation

## 2021-02-10 DIAGNOSIS — N183 Chronic kidney disease, stage 3 unspecified: Secondary | ICD-10-CM | POA: Insufficient documentation

## 2021-02-10 LAB — BASIC METABOLIC PANEL
Anion gap: 6 (ref 5–15)
BUN: 34 mg/dL — ABNORMAL HIGH (ref 8–23)
CO2: 25 mmol/L (ref 22–32)
Calcium: 8.2 mg/dL — ABNORMAL LOW (ref 8.9–10.3)
Chloride: 108 mmol/L (ref 98–111)
Creatinine, Ser: 2.76 mg/dL — ABNORMAL HIGH (ref 0.61–1.24)
GFR, Estimated: 24 mL/min — ABNORMAL LOW (ref >60)
Glucose, Bld: 136 mg/dL — ABNORMAL HIGH (ref 70–99)
Potassium: 4.4 mmol/L (ref 3.5–5.1)
Sodium: 139 mmol/L (ref 135–145)

## 2021-02-10 MED ORDER — POTASSIUM CHLORIDE CRYS ER 20 MEQ PO TBCR: 20.0000 meq | EXTENDED_RELEASE_TABLET | Freq: Once | ORAL | Status: AC

## 2021-02-10 MED ORDER — FUROSEMIDE 10 MG/ML IJ SOLN
INTRAMUSCULAR | Status: AC
  Administered 2021-02-10: 40 mg via INTRAVENOUS
  Filled 2021-02-10: qty 4

## 2021-02-10 MED ORDER — POTASSIUM CHLORIDE CRYS ER 20 MEQ PO TBCR
EXTENDED_RELEASE_TABLET | ORAL | Status: AC
  Administered 2021-02-10: 20 meq via ORAL
  Filled 2021-02-10: qty 1

## 2021-02-10 MED ORDER — FUROSEMIDE 10 MG/ML IJ SOLN: 40.0000 mg | Freq: Once | INTRAMUSCULAR | Status: AC

## 2021-02-11 ENCOUNTER — Telehealth: Payer: Medicare HMO

## 2021-02-15 ENCOUNTER — Ambulatory Visit
Admission: RE | Admit: 2021-02-15 | Discharge: 2021-02-15 | Disposition: A | Discharge status: Home / Self Care | Payer: Medicare HMO | Source: Ambulatory Visit | Attending: Nephrology | Admitting: Nephrology

## 2021-02-15 ENCOUNTER — Other Ambulatory Visit: Payer: Self-pay

## 2021-02-15 DIAGNOSIS — N183 Chronic kidney disease, stage 3 unspecified: Secondary | ICD-10-CM | POA: Diagnosis not present

## 2021-02-15 DIAGNOSIS — R600 Localized edema: Secondary | ICD-10-CM | POA: Diagnosis not present

## 2021-02-15 MED ORDER — POTASSIUM CHLORIDE CRYS ER 20 MEQ PO TBCR
EXTENDED_RELEASE_TABLET | ORAL | Status: AC
  Administered 2021-02-15: 20 meq via ORAL
  Filled 2021-02-15: qty 1

## 2021-02-15 MED ORDER — FUROSEMIDE 10 MG/ML IJ SOLN
INTRAMUSCULAR | Status: AC
  Administered 2021-02-15: 40 mg via INTRAVENOUS
  Filled 2021-02-15: qty 4

## 2021-02-15 MED ORDER — POTASSIUM CHLORIDE CRYS ER 20 MEQ PO TBCR: 20.0000 meq | EXTENDED_RELEASE_TABLET | Freq: Once | ORAL | Status: AC

## 2021-02-15 MED ORDER — FUROSEMIDE 10 MG/ML IJ SOLN: 40.0000 mg | Freq: Once | INTRAMUSCULAR | Status: AC

## 2021-02-17 ENCOUNTER — Ambulatory Visit
Admission: RE | Admit: 2021-02-17 | Discharge: 2021-02-17 | Disposition: A | Discharge status: Home / Self Care | Payer: Medicare HMO | Source: Ambulatory Visit | Attending: Nephrology | Admitting: Nephrology

## 2021-02-17 ENCOUNTER — Other Ambulatory Visit: Payer: Self-pay

## 2021-02-17 DIAGNOSIS — R2240 Localized swelling, mass and lump, unspecified lower limb: Secondary | ICD-10-CM | POA: Diagnosis not present

## 2021-02-17 DIAGNOSIS — N183 Chronic kidney disease, stage 3 unspecified: Secondary | ICD-10-CM | POA: Diagnosis not present

## 2021-02-17 LAB — BASIC METABOLIC PANEL
Anion gap: 8 (ref 5–15)
BUN: 40 mg/dL — ABNORMAL HIGH (ref 8–23)
CO2: 27 mmol/L (ref 22–32)
Calcium: 8.4 mg/dL — ABNORMAL LOW (ref 8.9–10.3)
Chloride: 107 mmol/L (ref 98–111)
Creatinine, Ser: 2.79 mg/dL — ABNORMAL HIGH (ref 0.61–1.24)
GFR, Estimated: 23 mL/min — ABNORMAL LOW (ref >60)
Glucose, Bld: 71 mg/dL (ref 70–99)
Potassium: 3.8 mmol/L (ref 3.5–5.1)
Sodium: 142 mmol/L (ref 135–145)

## 2021-02-17 MED ORDER — POTASSIUM CHLORIDE CRYS ER 20 MEQ PO TBCR: 20.0000 meq | EXTENDED_RELEASE_TABLET | Freq: Once | ORAL | Status: AC

## 2021-02-17 MED ORDER — POTASSIUM CHLORIDE CRYS ER 20 MEQ PO TBCR
EXTENDED_RELEASE_TABLET | ORAL | Status: AC
  Administered 2021-02-17: 20 meq via ORAL
  Filled 2021-02-17: qty 1

## 2021-02-17 MED ORDER — FUROSEMIDE 10 MG/ML IJ SOLN
INTRAMUSCULAR | Status: AC
  Administered 2021-02-17: 40 mg via INTRAVENOUS
  Filled 2021-02-17: qty 4

## 2021-02-17 MED ORDER — FUROSEMIDE 10 MG/ML IJ SOLN: 40.0000 mg | Freq: Once | INTRAMUSCULAR | Status: AC

## 2021-02-21 ENCOUNTER — Other Ambulatory Visit: Payer: Self-pay

## 2021-02-21 ENCOUNTER — Ambulatory Visit (INDEPENDENT_AMBULATORY_CARE_PROVIDER_SITE_OTHER): Payer: Medicare HMO | Admitting: Family Medicine

## 2021-02-21 DIAGNOSIS — Z794 Long term (current) use of insulin: Secondary | ICD-10-CM | POA: Diagnosis not present

## 2021-02-21 DIAGNOSIS — E039 Hypothyroidism, unspecified: Secondary | ICD-10-CM | POA: Diagnosis not present

## 2021-02-21 DIAGNOSIS — E1121 Type 2 diabetes mellitus with diabetic nephropathy: Secondary | ICD-10-CM

## 2021-02-21 DIAGNOSIS — R251 Tremor, unspecified: Secondary | ICD-10-CM | POA: Diagnosis not present

## 2021-02-21 NOTE — Patient Instructions (Addendum)
Go to the lab on the way out.   If you have mychart we'll likely use that to update you.   (A1c and thyroid). Take care.  Glad to see you. I would get a flu shot each fall.    Let me see about tremor treatment options.

## 2021-02-21 NOTE — Progress Notes (Signed)
This visit occurred during the SARS-CoV-2 public health emergency.  Safety protocols were in place, including screening questions prior to the visit, additional usage of staff PPE, and extensive cleaning of exam room while observing appropriate contact time as indicated for disinfecting solutions.  Diabetes:  Using medications without difficulties: yes Hypoglycemic episodes: occ lower sugars noted, only if prolonged fasting.  Hyperglycemic episodes: no Feet problems: no Blood Sugars averaging: 77 this AM.   eye exam within last year: yes.   Labs pending.    Hypothyroidism.  Compliant.  Due for TSH.  No neck mass. Occ dysphagia in the last few weeks.  See notes on labs.    R>L hand action but not rest tremor noted more by patient.  He is interested in treatment.  Longstanding but more noted by patient recently.  No dysphagia.  Normal smile.  No new gait changes.    He is going for potential IV lasix treatment tomorrow.    He had a new rx for calcitriol 0.62mcg per renal, d/w pt.    Meds, vitals, and allergies reviewed.  ROS: Per HPI unless specifically indicated in ROS section   GEN: nad, alert and oriented, on O2 at baseline.   HEENT: ncat NECK: supple w/o LA, no TMG noted on exam.   CV: rrr. PULM: ctab superiorly with some mildly dec BS at the bases, no inc wob ABD: soft, +bs EXT: 2+ edema in compressions stockings SKIN: well perfused No resting hand or head tremor but action tremor noted with bilateral hands, right greater than left.  35 minutes were devoted to patient care in this encounter (this includes time spent reviewing the patient's file/history, interviewing and examining the patient, counseling/reviewing plan with patient).

## 2021-02-22 ENCOUNTER — Ambulatory Visit
Admission: RE | Admit: 2021-02-22 | Discharge: 2021-02-22 | Disposition: A | Discharge status: Home / Self Care | Payer: Medicare HMO | Source: Ambulatory Visit | Attending: Nephrology | Admitting: Nephrology

## 2021-02-22 ENCOUNTER — Telehealth: Payer: Self-pay

## 2021-02-22 DIAGNOSIS — R600 Localized edema: Secondary | ICD-10-CM | POA: Diagnosis not present

## 2021-02-22 DIAGNOSIS — N183 Chronic kidney disease, stage 3 unspecified: Secondary | ICD-10-CM | POA: Insufficient documentation

## 2021-02-22 LAB — TSH: TSH: 7.12 u[IU]/mL — ABNORMAL HIGH (ref 0.35–5.50)

## 2021-02-22 LAB — HEMOGLOBIN A1C: Hgb A1c MFr Bld: 6.7 % — ABNORMAL HIGH (ref 4.6–6.5)

## 2021-02-22 MED ORDER — POTASSIUM CHLORIDE CRYS ER 20 MEQ PO TBCR: 20.0000 meq | EXTENDED_RELEASE_TABLET | Freq: Once | ORAL | Status: AC

## 2021-02-22 MED ORDER — POTASSIUM CHLORIDE CRYS ER 20 MEQ PO TBCR
EXTENDED_RELEASE_TABLET | ORAL | Status: AC
  Administered 2021-02-22: 20 meq via ORAL
  Filled 2021-02-22: qty 1

## 2021-02-22 MED ORDER — FUROSEMIDE 10 MG/ML IJ SOLN
INTRAMUSCULAR | Status: AC
  Administered 2021-02-22: 40 mg via INTRAVENOUS
  Filled 2021-02-22: qty 4

## 2021-02-22 MED ORDER — FUROSEMIDE 10 MG/ML IJ SOLN: 40.0000 mg | Freq: Once | INTRAMUSCULAR | Status: AC

## 2021-02-22 MED ORDER — SODIUM CHLORIDE FLUSH 0.9 % IV SOLN
INTRAVENOUS | Status: AC
  Filled 2021-02-22: qty 20

## 2021-02-22 NOTE — Chronic Care Management (AMB) (Addendum)
Chronic Care Management Pharmacy Assistant   Name: Stephen [name]  MRN: [id number] DOB: 07-28-1945  Reason for Encounter: CCM Reminder Call   Conditions to be addressed/monitored: CHF, HTN, HLD, DMII, and CKD Stage 3  Medications: Outpatient Encounter Medications as of 02/22/2021  Medication Sig   ACCU-CHEK SOFTCLIX LANCETS lancets Use as instructed to check blood sugar three times daily and as needed.  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   albuterol (VENTOLIN HFA) 108 (90 Base) MCG/ACT inhaler INHALE 2 PUFFS INTO THE LUNGS EVERY 6 HOURS AS NEEDED FOR WHEEZING OR SHORTNESS OF BREATH. DISPENSE 3 INHALERS   Alcohol Swabs (B-D SINGLE USE SWABS REGULAR) PADS USE TO CLEANS AREA PRIOR TO CHECKING BLOOD SUGAR THREE TIMES DAILY AND AS NEEDED   aspirin EC 81 MG tablet Take 1 tablet (81 mg total) by mouth daily. Swallow whole.   atorvastatin (LIPITOR) 80 MG tablet TAKE 1 TABLET AT BEDTIME   Blood Glucose Monitoring Suppl (ACCU-CHEK AVIVA PLUS) w/Device KIT Use to check blood sugar three times daily and as needed.  Diagnosis: E11.22  Insulin-dependent   calcitRIOL (ROCALTROL) 0.25 MCG capsule Take 0.25 mcg by mouth daily.   carvedilol (COREG) 12.5 MG tablet TAKE 1 TABLET TWO TIMES DAILY WITH MEALS   cloNIDine (CATAPRES) 0.3 MG tablet TAKE 1 TABLET THREE TIMES DAILY   clopidogrel (PLAVIX) 75 MG tablet TAKE 1 TABLET EVERY DAY   diphenhydrAMINE (BENADRYL) 50 MG tablet Take 50 mg by mouth at bedtime as needed for allergies.   dorzolamide-timolol (COSOPT) 22.3-6.8 MG/ML ophthalmic solution Place 1 drop into both eyes in the morning and at bedtime.   DROPLET INSULIN SYRINGE 30G X 5/16" 1 ML MISC USE AS DIRECTED TO INJECT INSULIN   ezetimibe (ZETIA) 10 MG tablet TAKE 1 TABLET EVERY DAY   fluticasone (FLONASE) 50 MCG/ACT nasal spray Place 2 sprays into both nostrils daily.   Fluticasone-Umeclidin-Vilant (TRELEGY ELLIPTA) 200-62.5-25 MCG/INH AEPB Inhale 1 puff into the lungs daily.   gabapentin  (NEURONTIN) 400 MG capsule Take 2 tabs in the AM and 3 at night.   glucose blood (ACCU-CHEK AVIVA PLUS) test strip USE AS INSTRUCTED TO CHECK BLOOD SUGAR 3 TIMES DAILY OR AS NEEDED   hydrALAZINE (APRESOLINE) 50 MG tablet Take 1 tablet (50 mg total) by mouth 3 (three) times daily.   HYDROcodone-acetaminophen (NORCO/VICODIN) 5-325 MG tablet Take 1 tablet by mouth every 6 (six) hours as needed for moderate pain.   insulin NPH-regular Human (70-30) 100 UNIT/ML injection Inject 40-60 Units into the skin as directed. Take 60 units in the morning and 40 units in the evening   isosorbide mononitrate (IMDUR) 60 MG 24 hr tablet TAKE 1 AND 1/2 TABLETS TWICE DAILY   levothyroxine (SYNTHROID) 25 MCG tablet take 2 of the 25 mcg tablets Monday through Friday with 1 tablet on Saturday and Sunday.  12 pills/week.   montelukast (SINGULAIR) 10 MG tablet Take 1 tablet (10 mg total) by mouth daily.   nitroGLYCERIN (NITROSTAT) 0.4 MG SL tablet Place 1 tablet (0.4 mg total) under the tongue every 5 (five) minutes as needed. May repeat x3   omeprazole (PRILOSEC) 20 MG capsule Take 20 mg by mouth daily before breakfast.   potassium chloride (KLOR-CON) 10 MEQ tablet Take 2 tablets (20 meq) by mouth once daily   Sennosides (EX-LAX) 15 MG CHEW Chew 15 mg by mouth 2 (two) times daily as needed (constipation).   sildenafil (REVATIO) 20 MG tablet Take 1 tablet (20 mg total) by mouth 3 (three) times  daily.   tamsulosin (FLOMAX) 0.4 MG CAPS capsule TAKE 1 CAPSULE AT BEDTIME   torsemide (DEMADEX) 20 MG tablet Take 2 tablets (40 mg) by mouth once daily   No facility-administered encounter medications on file as of 02/22/2021.   Stephen [name] was in the shower at the time of the call so his wife took information  to remind Stephen [name]  of his upcoming telephone visit with Debbora Dus on 02/28/21 at 1:00pm. Patient was reminded to have all medications, supplements and any blood glucose and blood pressure readings available for review  at appointment.   Are you having any problems with your medications? No  Do you have any concerns you like to discuss with the pharmacist? No  Currently the patient is going for IV lasix treatments per Nephrology on tuesdays and thursdays for 2 weeks.   Star Rating Drugs: Medication:  Last Fill: Day Supply Atorvastatin 80mg   01/18/21  [place], CPP notified  Avel Sensor, [place] Assistant [phone]  I have reviewed the care management and care coordination activities outlined in this encounter and I am certifying that I agree with the content of this note. No further action required.  Debbora Dus, PharmD Clinical Pharmacist [facility] at [facility] [phone]

## 2021-02-23 ENCOUNTER — Other Ambulatory Visit: Payer: Self-pay | Admitting: Family Medicine

## 2021-02-23 DIAGNOSIS — Z794 Long term (current) use of insulin: Secondary | ICD-10-CM

## 2021-02-23 DIAGNOSIS — E1121 Type 2 diabetes mellitus with diabetic nephropathy: Secondary | ICD-10-CM

## 2021-02-23 DIAGNOSIS — E039 Hypothyroidism, unspecified: Secondary | ICD-10-CM

## 2021-02-23 MED ORDER — PRIMIDONE 50 MG PO TABS: 25.0000 mg | ORAL_TABLET | Freq: Every morning | ORAL | 1 refills | Status: DC

## 2021-02-23 MED ORDER — LEVOTHYROXINE SODIUM 25 MCG PO TABS: ORAL_TABLET | ORAL | 1 refills | Status: AC

## 2021-02-23 NOTE — Assessment & Plan Note (Signed)
No change in labs yet.  Continue 70/30 insulin, taking 60 units in the morning and 40 units in the evening.  Cautions given to patient regarding prolonged fasting.  See notes on A1c.

## 2021-02-23 NOTE — Assessment & Plan Note (Signed)
See notes on labs.  Continue levothyroxine as is for now.

## 2021-02-23 NOTE — Assessment & Plan Note (Signed)
I will check his TSH first and I also see about options for treatment.  See notes on follow-up labs.

## 2021-02-24 ENCOUNTER — Other Ambulatory Visit: Payer: Self-pay

## 2021-02-24 ENCOUNTER — Ambulatory Visit
Admission: RE | Admit: 2021-02-24 | Discharge: 2021-02-24 | Disposition: A | Discharge status: Home / Self Care | Payer: Medicare HMO | Source: Ambulatory Visit | Attending: Nephrology | Admitting: Nephrology

## 2021-02-24 DIAGNOSIS — R600 Localized edema: Secondary | ICD-10-CM | POA: Insufficient documentation

## 2021-02-24 DIAGNOSIS — N183 Chronic kidney disease, stage 3 unspecified: Secondary | ICD-10-CM | POA: Diagnosis not present

## 2021-02-24 DIAGNOSIS — J449 Chronic obstructive pulmonary disease, unspecified: Secondary | ICD-10-CM | POA: Diagnosis not present

## 2021-02-24 LAB — BASIC METABOLIC PANEL
Anion gap: 5 (ref 5–15)
BUN: 36 mg/dL — ABNORMAL HIGH (ref 8–23)
CO2: 28 mmol/L (ref 22–32)
Calcium: 8.4 mg/dL — ABNORMAL LOW (ref 8.9–10.3)
Chloride: 109 mmol/L (ref 98–111)
Creatinine, Ser: 2.92 mg/dL — ABNORMAL HIGH (ref 0.61–1.24)
GFR, Estimated: 22 mL/min — ABNORMAL LOW (ref >60)
Glucose, Bld: 66 mg/dL — ABNORMAL LOW (ref 70–99)
Potassium: 3.8 mmol/L (ref 3.5–5.1)
Sodium: 142 mmol/L (ref 135–145)

## 2021-02-24 MED ORDER — POTASSIUM CHLORIDE CRYS ER 20 MEQ PO TBCR
EXTENDED_RELEASE_TABLET | ORAL | Status: AC
  Administered 2021-02-24: 20 meq via ORAL
  Filled 2021-02-24: qty 1

## 2021-02-24 MED ORDER — FUROSEMIDE 10 MG/ML IJ SOLN: 40.0000 mg | Freq: Once | INTRAMUSCULAR | Status: AC

## 2021-02-24 MED ORDER — FUROSEMIDE 10 MG/ML IJ SOLN
INTRAMUSCULAR | Status: AC
  Administered 2021-02-24: 40 mg via INTRAVENOUS
  Filled 2021-02-24: qty 4

## 2021-02-24 MED ORDER — POTASSIUM CHLORIDE CRYS ER 20 MEQ PO TBCR: 20.0000 meq | EXTENDED_RELEASE_TABLET | Freq: Once | ORAL | Status: AC

## 2021-02-28 ENCOUNTER — Ambulatory Visit (INDEPENDENT_AMBULATORY_CARE_PROVIDER_SITE_OTHER): Payer: Medicare HMO

## 2021-02-28 ENCOUNTER — Other Ambulatory Visit: Payer: Self-pay

## 2021-02-28 DIAGNOSIS — I5032 Chronic diastolic (congestive) heart failure: Secondary | ICD-10-CM | POA: Diagnosis not present

## 2021-02-28 DIAGNOSIS — E1121 Type 2 diabetes mellitus with diabetic nephropathy: Secondary | ICD-10-CM

## 2021-02-28 DIAGNOSIS — I10 Essential (primary) hypertension: Secondary | ICD-10-CM

## 2021-02-28 DIAGNOSIS — Z794 Long term (current) use of insulin: Secondary | ICD-10-CM

## 2021-02-28 NOTE — Progress Notes (Signed)
Chronic Care Management Pharmacy Note  03/01/2021 Name:  Stephen [name] MRN:  [id number] DOB:  03-03-1946  Summary: Pt presented for CCM follow up visit by telephone. He did not have any health or medication concerns. Knowledgeable of medications and no adherence or cost concerns. He affirms recent dose change levothyroxine and tremor improving on primidone. We discussed diabetes, no hypoglycemia or BG > 200. He mainly checks BG when he feels bad. A1c 6.7%. Discuss CHF, affirms adherence to potassium and torsemide exactly as prescribed. Does not weigh daily or check oxygen. Does not routinely monitor home BP but has been within goal in clinic.  Recommendations:  -Recommend checking daily weights and contact cardiology office with weight gain > 3 lbs in 24 hours of 5 lbs in 1 week. -Recommend checking BG more often (ideally daily with insulin use) to ensure no readings less than 70   Plan:  -PCP visit 09//12/2020 for TSH -CHF call in 3 months for daily weight/diuretic overview -CCM Follow up 6 months  Subjective: Stephen [name] is an 75 y.o. year old male who is a primary patient of Para March, Dwana Curd, MD.  The CCM team was consulted for assistance with disease management and care coordination needs.    Engaged with patient by telephone for follow up visit in response to provider referral for pharmacy case management and/or care coordination services.   Consent to Services:  The patient was given information about Chronic Care Management services, agreed to services, and gave verbal consent prior to initiation of services.  Please see initial visit note for detailed documentation.   Patient Care Team: [name], MD as PCP - General (Family Medicine) Antonieta Iba, MD as PCP - Cardiology (Cardiology) Delma Freeze, FNP as Nurse Practitioner (Family Medicine) Phil Dopp, [facility] as Pharmacist (Pharmacist) Phil Dopp, [facility] as Pharmacist (Pharmacist)  Recent office  visits:  02/23/21 - Lab results - TSH still high. Take 2 levothyroxine Mon-Sat and 1 Sunday (increase by 25 mcg total weekly dose). Try primidone 25 mg daily for the tremor (will need to break 50 mg tablet in half). A1c 6.7%, continue as is. Recheck 3 months. 12/01/20 - Lab results - TSH is still high but closer to normal. Increase levothyroxine - take 2 of the 25 mcg tablets Monday through Friday with 1 tablet on Saturday and Sunday (12 tablets in a week) We can recheck a TSH in about 3 months. 11/25/20 -  Dr. Para March, PCP - Pt presented for DM, cough, bruising, and chronic condition follow up. Reasonable to try Flonase for cough with routine cautions and update me if needed.   Recent consult visits:  02/08/21 - Pulmonary - Pt presented for asthma, ILD, OSA follow up. Trelegy samples provided. Start PAP. Follow up 3 months. 12/13/20 - [facility] - Pt presented for CHF, CAD follow up. Order BMET, Mg. Restart torsemide 40 mg daily and potassium 20 mEq daily.  Follow up with his PCP regarding low blood sugars. Follow up 4-6 months.   Hospital visits: 12/06/20 - Procedure - Right heart cath  Objective:  Lab Results  Component Value Date   CREATININE 2.92 (H) 02/24/2021   BUN 36 (H) 02/24/2021   GFR 24.87 (L) 11/25/2020   GFRNONAA 22 (L) 02/24/2021   GFRAA 28 (L) 08/12/2020   NA 142 02/24/2021   K 3.8 02/24/2021   CALCIUM 8.4 (L) 02/24/2021   CO2 28 02/24/2021    Lab Results  Component Value Date/Time   HGBA1C  6.7 (H) 02/21/2021 02:44 PM   HGBA1C 6.4 11/25/2020 10:53 AM   GFR 24.87 (L) 11/25/2020 10:53 AM   GFR 30.42 (L) 07/01/2019 11:19 AM   MICROALBUR 221.2 Repeated and verified X2. (H) 09/15/2013 10:09 AM    Last diabetic Eye exam:  Lab Results  Component Value Date/Time   HMDIABEYEEXA Retinopathy (A) 02/25/2020 12:00 AM    Last diabetic Foot exam: 03/19/20  Lab Results  Component Value Date   CHOL 125 12/23/2018   HDL 27.70 (L) 12/23/2018   LDLCALC 69 12/23/2018   TRIG 142.0  12/23/2018   CHOLHDL 5 12/23/2018    Hepatic Function Latest Ref Rng & Units 12/01/2020 12/23/2018 09/20/2018  Total Protein 6.5 - 8.1 g/dL 6.3(L) 5.7(L) 6.5  Albumin 3.5 - 5.0 g/dL 2.7(L) 3.1(L) 2.7(L)  AST 15 - 41 U/L $Remo'21 13 31  'ZJbip$ ALT 0 - 44 U/L $Remo'20 13 26  'MwsyC$ Alk Phosphatase 38 - 126 U/L 78 86 81  Total Bilirubin 0.3 - 1.2 mg/dL 0.5 0.5 0.6    Lab Results  Component Value Date/Time   TSH 7.12 (H) 02/21/2021 02:44 PM   TSH 6.29 (H) 11/25/2020 10:53 AM    CBC Latest Ref Rng & Units 12/01/2020 11/25/2020 08/12/2020  WBC 4.0 - 10.5 K/uL 7.6 7.3 7.4  Hemoglobin 13.0 - 17.0 g/dL 11.8(L) 11.9(L) 13.5  Hematocrit 39.0 - 52.0 % 36.4(L) 35.9(L) 40.8  Platelets 150 - 400 K/uL 169 169.0 195   No results found for: VD25OH  Clinical ASCVD: Yes  The ASCVD Risk score Mikey Bussing DC Jr., et al., 2013) failed to calculate for the following reasons:   The patient has a prior MI or stroke diagnosis    Depression screen [facility] 2/9 01/11/2021 07/26/2020 02/26/2019  Decreased Interest 0 0 0  Down, Depressed, Hopeless 0 0 0  PHQ - 2 Score 0 0 0  Altered sleeping 0 - -  Tired, decreased energy 0 - -  Change in appetite 0 - -  Feeling bad or failure about yourself  0 - -  Trouble concentrating 0 - -  Moving slowly or fidgety/restless 0 - -  Suicidal thoughts 0 - -  PHQ-9 Score 0 - -  Difficult doing work/chores Not difficult at all - -  Some recent data might be hidden    Echo - 08/06/20 showed LVEF 55-60%  Social History   Tobacco Use  Smoking Status Never  Smokeless Tobacco Never   BP Readings from Last 3 Encounters:  02/24/21 (!) 127/57  02/22/21 (!) 126/52  02/21/21 120/66   Pulse Readings from Last 3 Encounters:  02/24/21 (!) 58  02/22/21 (!) 57  02/21/21 67   Wt Readings from Last 3 Encounters:  02/21/21 265 lb (120.2 kg)  02/17/21 265 lb 3.4 oz (120.3 kg)  02/15/21 265 lb 3.4 oz (120.3 kg)    Assessment/Interventions: Review of patient past medical history, allergies, medications, health  status, including review of consultants reports, laboratory and other test data, was performed as part of comprehensive evaluation and provision of chronic care management services.   SDOH:  (Social Determinants of Health) assessments and interventions performed: Yes    CCM Care Plan  Allergies  Allergen Reactions   Calcium Channel Blockers     Would avoid if possible due to h/o peripheral edema    Medications Reviewed Today     Reviewed by Debbora Dus, [facility] (Pharmacist) on 02/28/21 at 1315  Med List Status: <None>   Medication Order Taking? Sig Documenting Provider Last Dose  Status Informant  ACCU-CHEK SOFTCLIX LANCETS lancets [id number]  Use as instructed to check blood sugar three times daily and as needed.  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 [name], MD  Active Spouse/Significant Other  albuterol (VENTOLIN HFA) 108 (90 Base) MCG/ACT inhaler [id number]  INHALE 2 PUFFS INTO THE LUNGS EVERY 6 HOURS AS NEEDED FOR WHEEZING OR SHORTNESS OF BREATH. DISPENSE 3 INHALERS [name], MD  Active   Alcohol Swabs (B-D SINGLE USE SWABS REGULAR) PADS [id number]  USE TO CLEANS AREA PRIOR TO CHECKING BLOOD SUGAR THREE TIMES DAILY AND AS NEEDED [name], MD  Active Spouse/Significant Other  aspirin EC 81 MG tablet [id number]  Take 1 tablet (81 mg total) by mouth daily. Swallow whole. [name], MD  Active   atorvastatin (LIPITOR) 80 MG tablet [id number]  TAKE 1 TABLET AT BEDTIME [name], MD  Active   Blood Glucose Monitoring Suppl (ACCU-CHEK AVIVA PLUS) w/Device KIT [id number]  Use to check blood sugar three times daily and as needed.  Diagnosis: E11.22  Insulin-dependent [name], MD  Active Spouse/Significant Other  calcitRIOL (ROCALTROL) 0.25 MCG capsule [id number]  Take 0.25 mcg by mouth daily. [provider]  Active   carvedilol (COREG) 12.5 MG tablet [id number]  TAKE 1 TABLET TWO TIMES DAILY WITH MEALS [name], MD  Active   cloNIDine  (CATAPRES) 0.3 MG tablet [id number]  TAKE 1 TABLET THREE TIMES DAILY Minna Merritts, MD  Active   clopidogrel (PLAVIX) 75 MG tablet [id number]  TAKE 1 TABLET EVERY DAY Loel Dubonnet, NP  Active   diphenhydrAMINE (BENADRYL) 50 MG tablet [id number]  Take 50 mg by mouth at bedtime as needed for allergies. [provider]  Active Spouse/Significant Other           Med Note Tamala Julian, Delfino Lovett   Fri Sep 20, 2018  9:40 AM)    dorzolamide-timolol (COSOPT) 22.3-6.8 MG/ML ophthalmic solution [id number]  Place 1 drop into both eyes in the morning and at bedtime. [provider]  Active Spouse/Significant Other  DROPLET INSULIN SYRINGE 30G X 5/16" 1 ML MISC [id number]  USE AS DIRECTED TO INJECT INSULIN [name], MD  Active   ezetimibe (ZETIA) 10 MG tablet [id number]  TAKE 1 TABLET EVERY DAY Gollan, Kathlene November, MD  Active   fluticasone (FLONASE) 50 MCG/ACT nasal spray [id number]  Place 2 sprays into both nostrils daily. [name], MD  Active   Fluticasone-Umeclidin-Vilant [facility] ELLIPTA) 200-62.5-25 MCG/INH AEPB [id number]  Inhale 1 puff into the lungs daily. Tyler Pita, MD  Active   gabapentin (NEURONTIN) 400 MG capsule [id number]  Take 2 tabs in the AM and 3 at night. [name], MD  Active   glucose blood (ACCU-CHEK AVIVA PLUS) test strip [id number]  USE AS INSTRUCTED TO CHECK BLOOD SUGAR 3 TIMES DAILY OR AS NEEDED [name], MD  Active Spouse/Significant Other  hydrALAZINE (APRESOLINE) 50 MG tablet [id number]  Take 1 tablet (50 mg total) by mouth 3 (three) times daily. Marrianne Mood D, PA-C  Active   HYDROcodone-acetaminophen (NORCO/VICODIN) 5-325 MG tablet [id number]  Take 1 tablet by mouth every 6 (six) hours as needed for moderate pain. [name], MD  Active Spouse/Significant Other  insulin NPH-regular Human (70-30) 100 UNIT/ML injection [id number] Yes Inject 40-60 Units into the skin as directed. Take 60 units in the morning and 40 units in the  evening [provider] Taking Active   isosorbide mononitrate (IMDUR) 60 MG  24 hr tablet [id number]  TAKE 1 AND 1/2 TABLETS TWICE DAILY Gollan, Tollie Pizza, MD  Active   levothyroxine (SYNTHROID) 25 MCG tablet [id number] Yes take 2 of the 25 mcg tablets Monday through Saturday with 1 tablet on Sunday.  13 pills/week. [name], MD Taking Active   montelukast (SINGULAIR) 10 MG tablet [id number]  Take 1 tablet (10 mg total) by mouth daily. Salena Saner, MD  Expired 01/04/21 2359   nitroGLYCERIN (NITROSTAT) 0.4 MG SL tablet [id number]  Place 1 tablet (0.4 mg total) under the tongue every 5 (five) minutes as needed. May repeat x3 [name], MD  Active   omeprazole (PRILOSEC) 20 MG capsule [id number]  Take 20 mg by mouth daily before breakfast. [provider]  Active Spouse/Significant Other  potassium chloride (KLOR-CON) 10 MEQ tablet [id number] Yes Take 2 tablets (20 meq) by mouth once daily Michaelle Birks, Jacquelyn D, PA-C Taking Active   primidone (MYSOLINE) 50 MG tablet [id number] Yes Take 0.5 tablets (25 mg total) by mouth in the morning. For tremor [name], MD Taking Active   Sennosides (EX-LAX) 15 MG CHEW [id number]  Chew 15 mg by mouth 2 (two) times daily as needed (constipation). [provider]  Active Spouse/Significant Other  sildenafil (REVATIO) 20 MG tablet [id number]  Take 1 tablet (20 mg total) by mouth 3 (three) times daily. Antonieta Iba, MD  Active Spouse/Significant Other           Med Note [facility] Nolon Nations Dec 13, 2020 10:56 AM)    tamsulosin (FLOMAX) 0.4 MG CAPS capsule [id number]  TAKE 1 CAPSULE AT BEDTIME [name], MD  Active   torsemide (DEMADEX) 20 MG tablet [id number] Yes Take 2 tablets (40 mg) by mouth once daily Lennon Alstrom, [place] Taking Active             Patient Active Problem List   Diagnosis Date Noted   Bruising 11/29/2020   Cough 11/29/2020   Unstable angina (HCC) 08/19/2020   Pulmonary  hypertension, unspecified (HCC) 08/19/2020   Tremor 07/29/2020   Hypoxia 03/21/2020   Left leg cellulitis 09/16/2019   Asthma 03/31/2019   Renal artery stenosis (HCC) 02/24/2019   Advance care planning 12/26/2018   Hypothyroidism 12/26/2018   HLD (hyperlipidemia) 12/26/2018   Coronary artery disease involving native coronary artery of native heart with angina pectoris (HCC) 10/09/2018   Pulmonary hypertension, primary (HCC) 09/26/2018   CHF (congestive heart failure) (HCC) 09/20/2018   Knee pain 03/26/2018   Encounter for chronic pain management 12/23/2017   Healthcare maintenance 09/21/2017   Skin lesion 06/15/2017   Acute on chronic renal failure (HCC) 01/15/2017   Coronary stent occlusion: Occluded circumflex stent 01/13/2017   Accelerated hypertension with diastolic congestive heart failure, NYHA class 3 (HCC); elevated LVEDP on chronic catheterization. 01/13/2017   NSTEMI (non-ST elevated myocardial infarction) (HCC) 01/11/2017   Blood in urine 12/08/2016   Lymphedema 10/23/2016   Low back pain with right-sided sciatica 04/03/2014   Muscle spasm 04/24/2013   Chronic edema 11/13/2012   Leg edema, right 09/29/2012   SK (seborrheic keratosis) 09/29/2012   Chronic diastolic heart failure (HCC) 12/15/2011   Sebaceous cyst 11/28/2011   Morbid obesity (HCC) 07/27/2011   Hyperlipidemia with target low density lipoprotein (LDL) cholesterol less than 70 mg/dL 69/22/9408   Adrenal adenoma 03/02/2011   Back pain 12/06/2010   Kidney stones 12/06/2010   Essential hypertension, benign 10/28/2010   PULMONARY FIBROSIS 09/15/2010  CAD (coronary artery disease) 09/01/2010   Type 2 diabetes mellitus with renal complication ([place]) 07/37/1062   METABOLIC SYNDROME X 69/48/5462   SLEEP APNEA, OBSTRUCTIVE 08/31/2010   Chronic kidney disease, stage III (moderate) ([place]) 08/31/2010    Immunization History  Administered Date(s) Administered   Fluad Quad(high Dose 65+) 03/27/2019, 03/19/2020    Influenza Split 04/25/2011, 05/17/2012   Influenza,inj,Quad PF,6+ Mos 04/22/2013, 04/03/2014, 03/02/2015, 03/02/2016, 03/12/2017, 03/25/2018   Moderna Sars-Covid-2 Vaccination 09/01/2019, 09/29/2019, 04/25/2020   Pneumococcal Conjugate-13 10/26/2014   Pneumococcal Polysaccharide-23 06/01/2009, 09/15/2013    Conditions to be addressed/monitored:  Hypertension, Hyperlipidemia, Diabetes, Heart Failure and Hypothyroidism  Care Plan : [place]  Updates made by Debbora Dus, [facility] since 03/01/2021 12:00 AM     Problem: Disease Management   Priority: High  Onset Date: 02/28/2021  Note:   Current Barriers:  Does not home monitor weight, oxygen levels. Does not frequently monitor BG or BP at home. Recent med changes - Patient reports improvement in tremor on primidone, he affirms recent dose increase levothyroxine.  Pharmacist Clinical Goal(s):  Patient will contact provider office for questions/concerns as evidenced notation of same in electronic health record through collaboration with PharmD and provider.   Interventions: 1:1 collaboration with [name], MD regarding development and update of comprehensive plan of care as evidenced by provider attestation and co-signature Inter-disciplinary care team collaboration (see longitudinal plan of care) Comprehensive medication review performed; medication list updated in electronic medical record  Hypertension (BP goal <140/90 mmHg) -Controlled - per clinic readings -Pt report BP checked every 2 weeks lately with IV diuretic infusion and averaged 130s/60s. -Current treatment: Carvedilol 12.5 mg - 1 tablet twice daily with meals Clonidine 0.3 mg - 1 tablet three times daily Hydralazine 50 mg - 1 three times daily  -Medications previously tried: amlodipine, hctz/valsartan  -Current home readings: doesn't check often but has an arm cuff monitor if needed -Current dietary habits: limits salt -Denies  hypotensive/hypertensive symptoms. Denies falls. -Educated on Importance of home blood pressure monitoring if symptomatic  -Refills timely, confirms adherence daily -Recommended continue current medication; Recommend checking BP with  abnormal symptoms and at least once weekly.  Hyperlipidemia: (LDL goal < 70) -Unable to assess. Did not discuss today. Most recent lipids from 12/2018 within goal. Pt reports kidney specialist checks cholesterol at every visit.  -Current treatment: Atorvastatin 80 mg - 1 tablet daily Ezetimibe 10 mg - 1 tablet daily -Medications previously tried: none -Assessed refill history - no adherence concerns.  Diabetes (A1c goal <7%) -Controlled per A1c 6.7%, no hypoglycemia or BG > 2000 -Current medications: Insulin 70/30 (NPH/regular) - (per patient report) Inject 60 units in morning and 40 at bedtime -Medications previously tried: none  -Current home glucose readings: checking only when he feels bad, last checked 3-4 days ago  Fasting glucose: lowest 77, usually running around 110  Pre-meal: none  Denies any BG < 70 -Current meal patterns:  Appetite pretty good, eating 3 meals per day Drinks: diet sodas and water only -Counseled to check feet daily and get yearly eye exams -Recommended continue current medications. Try checking BG 2-3 days per week. Call if any hypoglycemia (BG < 70) or s/s.  Hypothyroidism (Goal: TSH within normal range) -Not ideally controlled - recent dose adjustment 02/25/21, pt affirms dose change, TSH scheduled. -Current treatment  Levothyroxine 25 mcg - 2 tablet daily except take 1 on Sundays only - Pt confirms taking before other medications in the morning. -Recommend continuing current medications;  Follow up with PCP for labs.  Heart Failure (Goal: control symptoms and prevent exacerbations) Controlled - per patient report he is working with cardiology on swelling. He confirms starting torsemide. He takes 2 every day. He does not  weight himself daily. Type: Diastolic -Ejection fraction: 55-60% -Current treatment: Torsemide 20 mg - 2 daily  Potassium Chloride 10 mEq - 1 daily -Medications previously tried: none -Recommended to continue current medication; Recommend check weight and oxygen daily.  Patient Goals/Self-Care Activities Patient will:  - report any BG readings less than 70 - report any BP readings > 150/90 - check weight and oxygen level daily - contact CCM team with health concerns     Medication Assistance: None required.  Patient affirms current coverage meets needs.  Patient's preferred pharmacy is:  [facility] [address], [place] - [place] [address] Phone: [phone] Fax: [phone]  [facility] - [place] [address] Phone: [phone] Fax: [phone]  Pharmacy/Benefits:[facility]/Mail order - maintenance meds/synced; [facility] - Insulin, Trelegy; [facility]en[facility] - Sildenafil Uses pill box? No - takes straight from pill bottle, keeps them in a bread box in a kitchen  Pt endorses 100% compliance, denies any missed doses  We discussed: Current pharmacy is preferred with insurance plan and patient is satisfied with pharmacy services Patient decided to: Continue current medication management strategy  Care Plan and Follow Up Patient Decision:  Patient agrees to Care Plan and Follow-up.  Debbora Dus, PharmD Clinical Pharmacist [facility] at [facility] [phone]

## 2021-03-01 ENCOUNTER — Ambulatory Visit: Payer: Medicare HMO | Admitting: Cardiovascular Disease

## 2021-03-01 NOTE — Patient Instructions (Signed)
Dear Stephen [name],  Below is a summary of the goals we discussed during our follow up appointment on March 01, 2021. Please contact me anytime with questions or concerns.   Visit Information  Patient Care Plan: CCM Pharmacy Care Plan     Problem Identified: Disease Management   Priority: High  Onset Date: 02/28/2021  Note:   Current Barriers:  Does not home monitor weight, oxygen levels. Does not frequently monitor BG or BP at home. Recent med changes - Patient reports improvement in tremor on primidone, he affirms recent dose increase levothyroxine.  Pharmacist Clinical Goal(s):  Patient will contact provider office for questions/concerns as evidenced notation of same in electronic health record through collaboration with PharmD and provider.   Interventions: 1:1 collaboration with [name], MD regarding development and update of comprehensive plan of care as evidenced by provider attestation and co-signature Inter-disciplinary care team collaboration (see longitudinal plan of care) Comprehensive medication review performed; medication list updated in electronic medical record  Hypertension (BP goal <140/90 mmHg) -Controlled - per clinic readings -Pt report BP checked every 2 weeks lately with IV diuretic infusion and averaged 130s/60s. -Current treatment: Carvedilol 12.5 mg - 1 tablet twice daily with meals Clonidine 0.3 mg - 1 tablet three times daily Hydralazine 50 mg - 1 three times daily  -Medications previously tried: amlodipine, hctz/valsartan  -Current home readings: doesn't check often but has an arm cuff monitor if needed -Current dietary habits: limits salt -Denies hypotensive/hypertensive symptoms. Denies falls. -Educated on Importance of home blood pressure monitoring if symptomatic  -Refills timely, confirms adherence daily -Recommended continue current medication; Recommend checking BP with  abnormal symptoms and at least once  weekly.  Hyperlipidemia: (LDL goal < 70) -Unable to assess. Did not discuss today. Most recent lipids from 12/2018 within goal. Pt reports kidney specialist checks cholesterol at every visit.  -Current treatment: Atorvastatin 80 mg - 1 tablet daily Ezetimibe 10 mg - 1 tablet daily -Medications previously tried: none -Assessed refill history - no adherence concerns.  Diabetes (A1c goal <7%) -Controlled per A1c 6.7%, no hypoglycemia or BG > 2000 -Current medications: Insulin 70/30 (NPH/regular) - (per patient report) Inject 60 units in morning and 40 at bedtime -Medications previously tried: none  -Current home glucose readings: checking only when he feels bad, last checked 3-4 days ago  Fasting glucose: lowest 77, usually running around 110  Pre-meal: none  Denies any BG < 70 -Current meal patterns:  Appetite pretty good, eating 3 meals per day Drinks: diet sodas and water only -Counseled to check feet daily and get yearly eye exams -Recommended continue current medications. Try checking BG 2-3 days per week. Call if any hypoglycemia (BG < 70) or s/s.  Hypothyroidism (Goal: TSH within normal range) -Not ideally controlled - recent dose adjustment 02/25/21, pt affirms dose change, TSH scheduled. -Current treatment  Levothyroxine 25 mcg - 2 tablet daily except take 1 on Sundays only - Pt confirms taking before other medications in the morning. -Recommend continuing current medications; Follow up with PCP for labs.  Heart Failure (Goal: control symptoms and prevent exacerbations) Controlled - per patient report he is working with cardiology on swelling. He confirms starting torsemide. He takes 2 every day. He does not weight himself daily. Type: Diastolic -Ejection fraction: 55-60% -Current treatment: Torsemide 20 mg - 2 daily  Potassium Chloride 10 mEq - 1 daily -Medications previously tried: none -Recommended to continue current medication; Recommend check weight and oxygen  daily.  Patient Goals/Self-Care  Activities Patient will:  - report any BG readings less than 70 - report any BP readings > 150/90 - check weight and oxygen level daily - contact CCM team with health concerns      Patient verbalizes understanding of instructions provided today and agrees to view in [place].   Debbora Dus, PharmD Clinical Pharmacist [facility] [phone]

## 2021-03-08 ENCOUNTER — Other Ambulatory Visit: Payer: Medicare HMO

## 2021-03-08 ENCOUNTER — Ambulatory Visit (INDEPENDENT_AMBULATORY_CARE_PROVIDER_SITE_OTHER): Payer: Medicare HMO | Admitting: Family Medicine

## 2021-03-08 ENCOUNTER — Encounter: Payer: Self-pay | Admitting: Family Medicine

## 2021-03-08 ENCOUNTER — Other Ambulatory Visit: Payer: Self-pay | Admitting: Pulmonary Disease

## 2021-03-08 ENCOUNTER — Other Ambulatory Visit: Payer: Self-pay

## 2021-03-08 VITALS — BP 120/58 | HR 57 | Temp 98.3°F | Ht 65.0 in | Wt 268.0 lb

## 2021-03-08 DIAGNOSIS — E039 Hypothyroidism, unspecified: Secondary | ICD-10-CM

## 2021-03-08 DIAGNOSIS — Z23 Encounter for immunization: Secondary | ICD-10-CM | POA: Diagnosis not present

## 2021-03-08 DIAGNOSIS — R002 Palpitations: Secondary | ICD-10-CM | POA: Diagnosis not present

## 2021-03-08 DIAGNOSIS — R251 Tremor, unspecified: Secondary | ICD-10-CM

## 2021-03-08 NOTE — Progress Notes (Signed)
This visit occurred during the SARS-CoV-2 public health emergency.  Safety protocols were in place, including screening questions prior to the visit, additional usage of staff PPE, and extensive cleaning of exam room while observing appropriate contact time as indicated for disinfecting solutions.  Discussed TSH and tremor.  Primidone helped tremor.  Mildly drowsy with use but it clearly helped.  Fine motor movement is better, ie mealtimes are easier for patient now that he is taking primidone.  Discussed gradually adjusting his thyroid medication so as not to increase his tremor or cause iatrogenic hyperthyroidism.  He has noted fatigue in the meantime.  Hypothyroidism and treatment rationale discussed with patient.  He has noted occasional heart pounding at night but not heart racing.  He notes that after he is getting back in the bed after he gets up at night to urinate.  He is off oxygen typically for a brief period of time when he goes the bathroom.  He has not checked his pulse ox or his pulse at night.  I asked him to monitor this in the meantime.  He is not having symptoms during the daytime.  Meds, vitals, and allergies reviewed.   ROS: Per HPI unless specifically indicated in ROS section   GEN: nad, alert and oriented HEENT: mucous membranes moist NECK: supple w/o LA CV: rrr.   PULM: ctab, no inc wob ABD: soft, +bs EXT: 2+ BLE edema and compression stockings. SKIN: Well-perfused. Minimal hand tremor noted at rest and with finger-to-nose testing.  This is improved compared to previous.  34 minutes were devoted to patient care in this encounter (this includes time spent reviewing the patient's file/history, interviewing and examining the patient, counseling/reviewing plan with patient).

## 2021-03-08 NOTE — Patient Instructions (Signed)
Please see about getting blood drawn about 1 week prior to the appointment with Dr. Holley Raring (meaning get labs done in early November).   [facility]: [address] [place], [place] [address]  Call the regular number here [phone]) to set that up.    We can send a copy of your labs to the kidney clinic and we'll go from there.   Take care.  Glad to see you.

## 2021-03-09 DIAGNOSIS — R002 Palpitations: Secondary | ICD-10-CM | POA: Insufficient documentation

## 2021-03-09 NOTE — Assessment & Plan Note (Signed)
Continue with levothyroxine as is and we can recheck his TSH later on.  He agrees to plan.

## 2021-03-09 NOTE — Assessment & Plan Note (Signed)
Would continue primidone as is.  He agrees.  He will update me as needed.

## 2021-03-09 NOTE — Assessment & Plan Note (Signed)
Without tachycardia noted.  Usually noted when he is getting back in the bed after getting up to urinate and this is usually when he has briefly off O2.  I asked him to check his pulse ox and monitor his symptoms in the meantime and he will update me as needed.  He is not having exertional symptoms otherwise.

## 2021-03-14 ENCOUNTER — Ambulatory Visit: Payer: Medicare HMO | Admitting: Cardiovascular Disease

## 2021-03-16 ENCOUNTER — Other Ambulatory Visit: Payer: Self-pay | Admitting: Family Medicine

## 2021-03-16 ENCOUNTER — Other Ambulatory Visit: Payer: Self-pay | Admitting: Cardiovascular Disease

## 2021-03-16 DIAGNOSIS — E113213 Type 2 diabetes mellitus with mild nonproliferative diabetic retinopathy with macular edema, bilateral: Secondary | ICD-10-CM | POA: Diagnosis not present

## 2021-03-16 NOTE — Telephone Encounter (Signed)
Please contact pt for future appointment. Pt due for 3 month f/u. 

## 2021-03-24 ENCOUNTER — Other Ambulatory Visit: Payer: Self-pay | Admitting: Family Medicine

## 2021-03-25 NOTE — Telephone Encounter (Signed)
Refill request for Gabapentin 400 mg caps  LOV - 03/08/21 Next OV - not scheduled Last refill - 02/25/20 #450/3

## 2021-03-25 NOTE — Telephone Encounter (Signed)
Sent. Thanks.   

## 2021-03-27 DIAGNOSIS — J449 Chronic obstructive pulmonary disease, unspecified: Secondary | ICD-10-CM | POA: Diagnosis not present

## 2021-03-30 ENCOUNTER — Telehealth: Payer: Self-pay

## 2021-03-30 MED ORDER — TRELEGY ELLIPTA 200-62.5-25 MCG/INH IN AEPB
200.0000 ug | INHALATION_SPRAY | Freq: Every day | RESPIRATORY_TRACT | 10 refills | Status: DC
Start: 2021-03-30 — End: 2021-04-18

## 2021-03-31 NOTE — Telephone Encounter (Signed)
[place] application has been completed and faxed to [facility]

## 2021-04-01 NOTE — Telephone Encounter (Signed)
Created in error

## 2021-04-05 ENCOUNTER — Other Ambulatory Visit: Payer: Self-pay

## 2021-04-05 ENCOUNTER — Telehealth: Payer: Self-pay | Admitting: Pulmonary Disease

## 2021-04-05 ENCOUNTER — Emergency Department: Payer: Medicare HMO

## 2021-04-05 ENCOUNTER — Emergency Department
Admission: EM | Admit: 2021-04-05 | Discharge: 2021-04-05 | Disposition: A | Discharge status: Home / Self Care | Payer: Medicare HMO | Attending: Student in an Organized Health Care Education/Training Program | Admitting: Student in an Organized Health Care Education/Training Program

## 2021-04-05 ENCOUNTER — Other Ambulatory Visit: Payer: Self-pay | Admitting: Pulmonary Disease

## 2021-04-05 DIAGNOSIS — Z7982 Long term (current) use of aspirin: Secondary | ICD-10-CM | POA: Diagnosis not present

## 2021-04-05 DIAGNOSIS — I700 Atherosclerosis of aorta: Secondary | ICD-10-CM | POA: Diagnosis not present

## 2021-04-05 DIAGNOSIS — R0602 Shortness of breath: Secondary | ICD-10-CM

## 2021-04-05 DIAGNOSIS — J45909 Unspecified asthma, uncomplicated: Secondary | ICD-10-CM | POA: Diagnosis not present

## 2021-04-05 DIAGNOSIS — J441 Chronic obstructive pulmonary disease with (acute) exacerbation: Secondary | ICD-10-CM | POA: Insufficient documentation

## 2021-04-05 DIAGNOSIS — I5033 Acute on chronic diastolic (congestive) heart failure: Secondary | ICD-10-CM | POA: Insufficient documentation

## 2021-04-05 DIAGNOSIS — T17500A Unspecified foreign body in bronchus causing asphyxiation, initial encounter: Secondary | ICD-10-CM

## 2021-04-05 DIAGNOSIS — J9809 Other diseases of bronchus, not elsewhere classified: Secondary | ICD-10-CM | POA: Insufficient documentation

## 2021-04-05 DIAGNOSIS — E039 Hypothyroidism, unspecified: Secondary | ICD-10-CM | POA: Diagnosis not present

## 2021-04-05 DIAGNOSIS — Z794 Long term (current) use of insulin: Secondary | ICD-10-CM | POA: Insufficient documentation

## 2021-04-05 DIAGNOSIS — E1122 Type 2 diabetes mellitus with diabetic chronic kidney disease: Secondary | ICD-10-CM | POA: Insufficient documentation

## 2021-04-05 DIAGNOSIS — R590 Localized enlarged lymph nodes: Secondary | ICD-10-CM | POA: Diagnosis not present

## 2021-04-05 DIAGNOSIS — Z7952 Long term (current) use of systemic steroids: Secondary | ICD-10-CM | POA: Insufficient documentation

## 2021-04-05 DIAGNOSIS — N184 Chronic kidney disease, stage 4 (severe): Secondary | ICD-10-CM | POA: Diagnosis not present

## 2021-04-05 DIAGNOSIS — I130 Hypertensive heart and chronic kidney disease with heart failure and stage 1 through stage 4 chronic kidney disease, or unspecified chronic kidney disease: Secondary | ICD-10-CM | POA: Diagnosis not present

## 2021-04-05 DIAGNOSIS — I2510 Atherosclerotic heart disease of native coronary artery without angina pectoris: Secondary | ICD-10-CM | POA: Diagnosis not present

## 2021-04-05 DIAGNOSIS — J90 Pleural effusion, not elsewhere classified: Secondary | ICD-10-CM | POA: Diagnosis not present

## 2021-04-05 DIAGNOSIS — Z79899 Other long term (current) drug therapy: Secondary | ICD-10-CM | POA: Insufficient documentation

## 2021-04-05 DIAGNOSIS — Z85828 Personal history of other malignant neoplasm of skin: Secondary | ICD-10-CM | POA: Diagnosis not present

## 2021-04-05 LAB — CBC
HCT: 35.7 % — ABNORMAL LOW (ref 39.0–52.0)
Hemoglobin: 11.6 g/dL — ABNORMAL LOW (ref 13.0–17.0)
MCH: 29.4 pg (ref 26.0–34.0)
MCHC: 32.5 g/dL (ref 30.0–36.0)
MCV: 90.4 fL (ref 80.0–100.0)
Platelets: 174 10*3/uL (ref 150–400)
RBC: 3.95 MIL/uL — ABNORMAL LOW (ref 4.22–5.81)
RDW: 15.4 % (ref 11.5–15.5)
WBC: 6.9 10*3/uL (ref 4.0–10.5)
nRBC: 0 % (ref 0.0–0.2)

## 2021-04-05 LAB — COMPREHENSIVE METABOLIC PANEL
ALT: 18 U/L (ref 0–44)
AST: 19 U/L (ref 15–41)
Albumin: 2.9 g/dL — ABNORMAL LOW (ref 3.5–5.0)
Alkaline Phosphatase: 90 U/L (ref 38–126)
Anion gap: 7 (ref 5–15)
BUN: 36 mg/dL — ABNORMAL HIGH (ref 8–23)
CO2: 30 mmol/L (ref 22–32)
Calcium: 8.3 mg/dL — ABNORMAL LOW (ref 8.9–10.3)
Chloride: 103 mmol/L (ref 98–111)
Creatinine, Ser: 2.91 mg/dL — ABNORMAL HIGH (ref 0.61–1.24)
GFR, Estimated: 22 mL/min — ABNORMAL LOW (ref >60)
Glucose, Bld: 101 mg/dL — ABNORMAL HIGH (ref 70–99)
Potassium: 4.6 mmol/L (ref 3.5–5.1)
Sodium: 140 mmol/L (ref 135–145)
Total Bilirubin: 0.9 mg/dL (ref 0.3–1.2)
Total Protein: 6.7 g/dL (ref 6.5–8.1)

## 2021-04-05 LAB — TROPONIN I (HIGH SENSITIVITY)
Troponin I (High Sensitivity): 92 ng/L — ABNORMAL HIGH (ref <18)
Troponin I (High Sensitivity): 107 ng/L (ref <18)

## 2021-04-05 LAB — BRAIN NATRIURETIC PEPTIDE: B Natriuretic Peptide: 224.1 pg/mL — ABNORMAL HIGH (ref 0.0–100.0)

## 2021-04-05 MED ORDER — TRELEGY ELLIPTA 200-62.5-25 MCG/INH IN AEPB: 1.0000 | INHALATION_SPRAY | Freq: Every day | RESPIRATORY_TRACT | 0 refills | Status: AC

## 2021-04-05 NOTE — ED Triage Notes (Signed)
Pt arrives via POV with CC of shortness of breath. Around 9:30 pm pt had episode where SpO2 was 77-87% on his chronic 6L [place]. Hx of COPD, CHF, pulmonary hypertension, lung fibrosis, and kidney failure. 99% on chronic 6L [place]

## 2021-04-05 NOTE — Telephone Encounter (Signed)
Pt spouse states pt's had a stuffy nose and unable to clear nose. Pt had low grade temp of 99.6, now no fever and nose is clear. Pt is congestion in chest, finally able to cough up something. Pt spouse states o2 level dropped to 77 and came 90(took awhile). Please advise.  [phone]

## 2021-04-05 NOTE — ED Provider Notes (Signed)
Emergency Medicine Provider Triage Evaluation Note  Stephen [name] , a 75 y.o. male  was evaluated in triage.  Pt complains of increasing shortness of breath.  Found to be hypoxic in the 70s at home on 6 L nasal cannula.  Has been having some congestion.  Has been seen for pulmonary edema and requiring increasing doses of Lasix fairly recently.  States last night with the shortness of breath was also having some chest pain..  Review of Systems  Positive: sob Negative: nausea  Physical Exam  BP (!) 165/87 (BP Location: Left Arm)   Pulse (!) 57   Temp 98.3 F (36.8 C) (Oral)   Resp (!) 21   Ht 5\' 5"  (1.651 m)   SpO2 99%   BMI 44.60 kg/m  Gen:   Awake, no distress   Resp:  Mild tachypnea but protecting airway and in no resp distress MSK:   Moves extremities without difficulty, 3+ BLE edema Other:    Medical Decision Making  Medically screening exam initiated at 4:37 PM.  Appropriate orders placed.  Stephen [name] was informed that the remainder of the evaluation will be completed by another provider, this initial triage assessment does not replace that evaluation, and the importance of remaining in the ED until their evaluation is complete.     Stephen Lot, MD 04/05/21 [phone]

## 2021-04-05 NOTE — Telephone Encounter (Signed)
Spoke to patient, who is requesting a sample of trelegy.  Sample has been placed up front for pickup.  Nothing further needed at this time.

## 2021-04-05 NOTE — Telephone Encounter (Signed)
Called and spoke to patient via telephone.  C/o stuffy nose, prod cough with dark brown sputum, wheezing and low grade of 99.6. He stated that sx have came and gone for months but last night was the first time he had a fever.  He wears 5L cont. Spo2 dropped to 77% last night on 5L with exertion. Spo2 recovered to 90% with 6L and rest. He stated that it took 'awhile to recover'. Using trelegy once daily and albuterol at night.  No recent covid test.  Fully vaccinated against covid.   I have spoken to Dr. Patsey Berthold verbally, who recommends ED eval.  Patient is aware of recommendations and voiced his understanding.  Nothing further needed.

## 2021-04-05 NOTE — ED Notes (Signed)
Patient transported to CT 

## 2021-04-11 ENCOUNTER — Telehealth: Payer: Self-pay | Admitting: Family Medicine

## 2021-04-11 NOTE — Telephone Encounter (Signed)
Patients wife was the one our office called today. I have spoken to patient and his wife about appts that were needed.

## 2021-04-11 NOTE — Telephone Encounter (Signed)
FYI you call Audra

## 2021-04-11 NOTE — Telephone Encounter (Signed)
Pt says he just missed his call and would like a call back. He does not know what the call is concerning.

## 2021-04-14 ENCOUNTER — Encounter: Payer: Self-pay | Admitting: Pulmonary Disease

## 2021-04-15 ENCOUNTER — Inpatient Hospital Stay: Payer: Medicare HMO | Admitting: Family Medicine

## 2021-04-15 ENCOUNTER — Telehealth: Payer: Self-pay

## 2021-04-15 NOTE — Telephone Encounter (Signed)
I spoke with Mrs Pauwels [facility] signed) and pt does not want to go to ED due to the long wait and pt only saw blood x 1. Advised due to pts symptoms and CHF, pulm HTN,diabetes, renal failure, L lung fibrosis and coughing up mucus plug last week with pulse ox 88% at 6 L of oxygen today pt should be eval today. Mrs Gallardo said pt does not want to go to ED but she will call pts pulmonologist and see what they think pt should do. ED precautions given and pts wife voiced understanding but does not think pt will go.Sending note to Dr [name] and Janett Billow CMA. Will also teams [place].

## 2021-04-15 NOTE — Telephone Encounter (Signed)
If they get different advice from pulmonary, then I'll defer to pulmonary.  But I would advise ER eval given the situation and his hx.  Thanks.

## 2021-04-15 NOTE — Telephone Encounter (Signed)
Stephen [name] with patients wife and states patient is doing much better this afternoon. O2 96% and coughing is much better this afternoon as well. Advised if patient takes turn for the worse again to seek ER eval and wife agrees to do so if he gets worse again.

## 2021-04-15 NOTE — Telephone Encounter (Signed)
[facility] - Client TELEPHONE ADVICE RECORD [facility] Patient Name: SKYLAR PRIEST Gender: Male DOB: April 06, 1946 Age: 75 Y 2 M 2 D Return Phone Number: [phone] (Primary) Address: City/ State/ Zip: [place]  [address] Client [place] [facility] - Client Client Site [place] Physician Renford Dills - MD Contact Type Call Who Is Calling Patient / Member / Family / Caregiver Call Type Triage / Clinical Caller Name Avantae Bither Relationship To Patient Spouse Return Phone Number [phone] (Primary) Chief Complaint Coughing Up Blood Reason for Call Symptomatic / Request for [place] states her husband is coughing up blood. Office does not have appointments today. Translation No Nurse Assessment Nurse: Gareth Eagle, RN, Raquel Sarna Date/Time Eilene Ghazi Time): 04/15/2021 12:54:46 PM Confirm and document reason for call. If symptomatic, describe symptoms. ---Caller states husband has been coughing. He had some darker red blood mixed with sputum once this morning. Chest sore from coughing. Tested neg for COVID this morning. Seen in ER last week for brown sputum; chest CT done - no change. Was told he had coughed up a mucus plug. Does the patient have any new or worsening symptoms? ---Yes Will a triage be completed? ---Yes Related visit to physician within the last 2 weeks? ---Yes Does the PT have any chronic conditions? (i.e. diabetes, asthma, this includes High risk factors for pregnancy, etc.) ---Yes List chronic conditions. ---CHF, pulm HTN, diabetes, renal failure, L lung fibrosis; on Plavix Is this a behavioral health or substance abuse call? ---No Guidelines Guideline Title Affirmed Question Affirmed Notes Nurse Date/Time Eilene Ghazi Time) Coughing Up Blood Chest pain Theodoro Grist 04/15/2021 12:58:29 PM Disp. Time Eilene Ghazi Time) Disposition Final  User PLEASE NOTE: All timestamps contained within this report are represented as [place] Standard Time. CONFIDENTIALTY NOTICE: This fax transmission is intended only for the addressee. It contains information that is legally privileged, confidential or otherwise protected from use or disclosure. If you are not the intended recipient, you are strictly prohibited from reviewing, disclosing, copying using or disseminating any of this information or taking any action in reliance on or regarding this information. If you have received this fax in error, please notify us immediately by telephone so that we can arrange for its return to [place]. Phone: [phone], Toll-Free: [phone], Fax: [phone] Page: 2 of 2 Call Id: [id number] 04/15/2021 1:03:27 PM Go to ED Now Yes Gareth Eagle, RN, Judeth Horn Disagree/Comply Comply Caller Understands Yes PreDisposition InappropriateToAsk Care Advice Given Per Guideline GO TO ED NOW: * You need to be seen in the Emergency Department. * Leave now. Drive carefully. CALL EMS 911 IF: * Severe difficulty breathing CARE ADVICE given per Coughing Up Blood guideline. Comments User: Tilden Fossa, RN Date/Time (Eastern Time): 04/15/2021 12:59:35 PM O2 sat 88% on 6lpm O2; baseline sat 90-95%. User: Tilden Fossa, RN Date/Time (Eastern Time): 04/15/2021 1:05:04 PM Caller states he is hesitant to go to ER because he waited for about 8hr to be seen the last they went to ED; concerned that the same thing will happen this time as well and concerned about possible COVID exposure. User: Tilden Fossa, RN Date/Time Eilene Ghazi Time): 04/15/2021 1:08:53 PM Contacted [facility] per directive for pt preference not to go to ED. No appts available until Monday. Pt already advised to be seen in ED. Referrals [place]

## 2021-04-17 ENCOUNTER — Inpatient Hospital Stay
Admission: EM | Admit: 2021-04-17 | Discharge: 2021-04-30 | DRG: 280 | Disposition: A | Discharge status: Home Health | Payer: Medicare HMO | Attending: Internal Medicine | Admitting: Internal Medicine

## 2021-04-17 ENCOUNTER — Emergency Department: Payer: Medicare HMO

## 2021-04-17 ENCOUNTER — Encounter: Payer: Self-pay | Admitting: Radiology

## 2021-04-17 ENCOUNTER — Other Ambulatory Visit: Payer: Self-pay

## 2021-04-17 DIAGNOSIS — I248 Other forms of acute ischemic heart disease: Secondary | ICD-10-CM | POA: Diagnosis not present

## 2021-04-17 DIAGNOSIS — J9622 Acute and chronic respiratory failure with hypercapnia: Secondary | ICD-10-CM | POA: Diagnosis not present

## 2021-04-17 DIAGNOSIS — L03114 Cellulitis of left upper limb: Secondary | ICD-10-CM | POA: Diagnosis present

## 2021-04-17 DIAGNOSIS — E1122 Type 2 diabetes mellitus with diabetic chronic kidney disease: Secondary | ICD-10-CM | POA: Diagnosis not present

## 2021-04-17 DIAGNOSIS — J441 Chronic obstructive pulmonary disease with (acute) exacerbation: Secondary | ICD-10-CM | POA: Diagnosis not present

## 2021-04-17 DIAGNOSIS — E662 Morbid (severe) obesity with alveolar hypoventilation: Secondary | ICD-10-CM | POA: Diagnosis present

## 2021-04-17 DIAGNOSIS — I2510 Atherosclerotic heart disease of native coronary artery without angina pectoris: Secondary | ICD-10-CM | POA: Diagnosis not present

## 2021-04-17 DIAGNOSIS — Z87442 Personal history of urinary calculi: Secondary | ICD-10-CM

## 2021-04-17 DIAGNOSIS — Z888 Allergy status to other drugs, medicaments and biological substances status: Secondary | ICD-10-CM

## 2021-04-17 DIAGNOSIS — D696 Thrombocytopenia, unspecified: Secondary | ICD-10-CM | POA: Diagnosis present

## 2021-04-17 DIAGNOSIS — E8729 Other acidosis: Secondary | ICD-10-CM | POA: Diagnosis not present

## 2021-04-17 DIAGNOSIS — I5033 Acute on chronic diastolic (congestive) heart failure: Secondary | ICD-10-CM | POA: Diagnosis not present

## 2021-04-17 DIAGNOSIS — M79602 Pain in left arm: Secondary | ICD-10-CM

## 2021-04-17 DIAGNOSIS — I5023 Acute on chronic systolic (congestive) heart failure: Secondary | ICD-10-CM | POA: Diagnosis not present

## 2021-04-17 DIAGNOSIS — Z87440 Personal history of urinary (tract) infections: Secondary | ICD-10-CM

## 2021-04-17 DIAGNOSIS — I10 Essential (primary) hypertension: Secondary | ICD-10-CM | POA: Diagnosis not present

## 2021-04-17 DIAGNOSIS — G4733 Obstructive sleep apnea (adult) (pediatric): Secondary | ICD-10-CM

## 2021-04-17 DIAGNOSIS — I672 Cerebral atherosclerosis: Secondary | ICD-10-CM | POA: Diagnosis not present

## 2021-04-17 DIAGNOSIS — J449 Chronic obstructive pulmonary disease, unspecified: Secondary | ICD-10-CM | POA: Diagnosis not present

## 2021-04-17 DIAGNOSIS — R600 Localized edema: Secondary | ICD-10-CM | POA: Diagnosis not present

## 2021-04-17 DIAGNOSIS — E1165 Type 2 diabetes mellitus with hyperglycemia: Secondary | ICD-10-CM | POA: Diagnosis not present

## 2021-04-17 DIAGNOSIS — R0789 Other chest pain: Secondary | ICD-10-CM | POA: Diagnosis not present

## 2021-04-17 DIAGNOSIS — Z8719 Personal history of other diseases of the digestive system: Secondary | ICD-10-CM

## 2021-04-17 DIAGNOSIS — I272 Pulmonary hypertension, unspecified: Secondary | ICD-10-CM | POA: Diagnosis present

## 2021-04-17 DIAGNOSIS — Z833 Family history of diabetes mellitus: Secondary | ICD-10-CM

## 2021-04-17 DIAGNOSIS — Z8261 Family history of arthritis: Secondary | ICD-10-CM

## 2021-04-17 DIAGNOSIS — I440 Atrioventricular block, first degree: Secondary | ICD-10-CM | POA: Diagnosis present

## 2021-04-17 DIAGNOSIS — I5032 Chronic diastolic (congestive) heart failure: Secondary | ICD-10-CM

## 2021-04-17 DIAGNOSIS — N390 Urinary tract infection, site not specified: Secondary | ICD-10-CM | POA: Diagnosis not present

## 2021-04-17 DIAGNOSIS — J9600 Acute respiratory failure, unspecified whether with hypoxia or hypercapnia: Secondary | ICD-10-CM

## 2021-04-17 DIAGNOSIS — N189 Chronic kidney disease, unspecified: Secondary | ICD-10-CM | POA: Diagnosis not present

## 2021-04-17 DIAGNOSIS — R410 Disorientation, unspecified: Secondary | ICD-10-CM | POA: Diagnosis not present

## 2021-04-17 DIAGNOSIS — R531 Weakness: Secondary | ICD-10-CM | POA: Diagnosis not present

## 2021-04-17 DIAGNOSIS — E1121 Type 2 diabetes mellitus with diabetic nephropathy: Secondary | ICD-10-CM | POA: Diagnosis not present

## 2021-04-17 DIAGNOSIS — J9601 Acute respiratory failure with hypoxia: Secondary | ICD-10-CM | POA: Diagnosis not present

## 2021-04-17 DIAGNOSIS — Z825 Family history of asthma and other chronic lower respiratory diseases: Secondary | ICD-10-CM

## 2021-04-17 DIAGNOSIS — E039 Hypothyroidism, unspecified: Secondary | ICD-10-CM | POA: Diagnosis present

## 2021-04-17 DIAGNOSIS — J9690 Respiratory failure, unspecified, unspecified whether with hypoxia or hypercapnia: Secondary | ICD-10-CM | POA: Diagnosis not present

## 2021-04-17 DIAGNOSIS — I25118 Atherosclerotic heart disease of native coronary artery with other forms of angina pectoris: Secondary | ICD-10-CM | POA: Diagnosis not present

## 2021-04-17 DIAGNOSIS — E876 Hypokalemia: Secondary | ICD-10-CM | POA: Diagnosis present

## 2021-04-17 DIAGNOSIS — Z955 Presence of coronary angioplasty implant and graft: Secondary | ICD-10-CM

## 2021-04-17 DIAGNOSIS — R0602 Shortness of breath: Secondary | ICD-10-CM | POA: Diagnosis not present

## 2021-04-17 DIAGNOSIS — J849 Interstitial pulmonary disease, unspecified: Secondary | ICD-10-CM

## 2021-04-17 DIAGNOSIS — I2721 Secondary pulmonary arterial hypertension: Secondary | ICD-10-CM | POA: Diagnosis present

## 2021-04-17 DIAGNOSIS — I130 Hypertensive heart and chronic kidney disease with heart failure and stage 1 through stage 4 chronic kidney disease, or unspecified chronic kidney disease: Secondary | ICD-10-CM | POA: Diagnosis present

## 2021-04-17 DIAGNOSIS — R079 Chest pain, unspecified: Secondary | ICD-10-CM | POA: Diagnosis not present

## 2021-04-17 DIAGNOSIS — I252 Old myocardial infarction: Secondary | ICD-10-CM

## 2021-04-17 DIAGNOSIS — Z794 Long term (current) use of insulin: Secondary | ICD-10-CM | POA: Diagnosis not present

## 2021-04-17 DIAGNOSIS — J84112 Idiopathic pulmonary fibrosis: Secondary | ICD-10-CM | POA: Diagnosis present

## 2021-04-17 DIAGNOSIS — J9602 Acute respiratory failure with hypercapnia: Secondary | ICD-10-CM | POA: Diagnosis not present

## 2021-04-17 DIAGNOSIS — D631 Anemia in chronic kidney disease: Secondary | ICD-10-CM | POA: Diagnosis present

## 2021-04-17 DIAGNOSIS — K219 Gastro-esophageal reflux disease without esophagitis: Secondary | ICD-10-CM | POA: Diagnosis present

## 2021-04-17 DIAGNOSIS — E785 Hyperlipidemia, unspecified: Secondary | ICD-10-CM | POA: Diagnosis present

## 2021-04-17 DIAGNOSIS — I808 Phlebitis and thrombophlebitis of other sites: Secondary | ICD-10-CM | POA: Diagnosis present

## 2021-04-17 DIAGNOSIS — Z9981 Dependence on supplemental oxygen: Secondary | ICD-10-CM

## 2021-04-17 DIAGNOSIS — R0902 Hypoxemia: Secondary | ICD-10-CM | POA: Diagnosis not present

## 2021-04-17 DIAGNOSIS — Z6841 Body Mass Index (BMI) 40.0 and over, adult: Secondary | ICD-10-CM

## 2021-04-17 DIAGNOSIS — E538 Deficiency of other specified B group vitamins: Secondary | ICD-10-CM | POA: Diagnosis not present

## 2021-04-17 DIAGNOSIS — J80 Acute respiratory distress syndrome: Secondary | ICD-10-CM | POA: Diagnosis not present

## 2021-04-17 DIAGNOSIS — J9621 Acute and chronic respiratory failure with hypoxia: Principal | ICD-10-CM | POA: Diagnosis present

## 2021-04-17 DIAGNOSIS — I161 Hypertensive emergency: Secondary | ICD-10-CM | POA: Diagnosis present

## 2021-04-17 DIAGNOSIS — Z20822 Contact with and (suspected) exposure to covid-19: Secondary | ICD-10-CM | POA: Diagnosis present

## 2021-04-17 DIAGNOSIS — N184 Chronic kidney disease, stage 4 (severe): Secondary | ICD-10-CM | POA: Diagnosis present

## 2021-04-17 DIAGNOSIS — I270 Primary pulmonary hypertension: Secondary | ICD-10-CM | POA: Diagnosis present

## 2021-04-17 DIAGNOSIS — I214 Non-ST elevation (NSTEMI) myocardial infarction: Secondary | ICD-10-CM | POA: Diagnosis not present

## 2021-04-17 DIAGNOSIS — B9620 Unspecified Escherichia coli [E. coli] as the cause of diseases classified elsewhere: Secondary | ICD-10-CM | POA: Diagnosis not present

## 2021-04-17 DIAGNOSIS — M7989 Other specified soft tissue disorders: Secondary | ICD-10-CM

## 2021-04-17 DIAGNOSIS — R197 Diarrhea, unspecified: Secondary | ICD-10-CM | POA: Diagnosis not present

## 2021-04-17 DIAGNOSIS — B379 Candidiasis, unspecified: Secondary | ICD-10-CM | POA: Diagnosis present

## 2021-04-17 DIAGNOSIS — Z8249 Family history of ischemic heart disease and other diseases of the circulatory system: Secondary | ICD-10-CM

## 2021-04-17 DIAGNOSIS — G9341 Metabolic encephalopathy: Secondary | ICD-10-CM | POA: Diagnosis not present

## 2021-04-17 DIAGNOSIS — E1129 Type 2 diabetes mellitus with other diabetic kidney complication: Secondary | ICD-10-CM | POA: Diagnosis present

## 2021-04-17 DIAGNOSIS — J8410 Pulmonary fibrosis, unspecified: Secondary | ICD-10-CM | POA: Diagnosis not present

## 2021-04-17 DIAGNOSIS — Z803 Family history of malignant neoplasm of breast: Secondary | ICD-10-CM

## 2021-04-17 DIAGNOSIS — Z85828 Personal history of other malignant neoplasm of skin: Secondary | ICD-10-CM

## 2021-04-17 DIAGNOSIS — N179 Acute kidney failure, unspecified: Secondary | ICD-10-CM | POA: Diagnosis present

## 2021-04-17 DIAGNOSIS — I5031 Acute diastolic (congestive) heart failure: Secondary | ICD-10-CM | POA: Diagnosis not present

## 2021-04-17 DIAGNOSIS — I517 Cardiomegaly: Secondary | ICD-10-CM | POA: Diagnosis not present

## 2021-04-17 DIAGNOSIS — Z79899 Other long term (current) drug therapy: Secondary | ICD-10-CM

## 2021-04-17 DIAGNOSIS — R0600 Dyspnea, unspecified: Secondary | ICD-10-CM

## 2021-04-17 DIAGNOSIS — R069 Unspecified abnormalities of breathing: Secondary | ICD-10-CM | POA: Diagnosis not present

## 2021-04-17 LAB — LACTIC ACID, PLASMA: Lactic Acid, Venous: 1.2 mmol/L (ref 0.5–1.9)

## 2021-04-17 LAB — CBC WITH DIFFERENTIAL/PLATELET
Abs Immature Granulocytes: 0.04 10*3/uL (ref 0.00–0.07)
Basophils Absolute: 0 10*3/uL (ref 0.0–0.1)
Basophils Relative: 1 %
Eosinophils Absolute: 0.2 10*3/uL (ref 0.0–0.5)
Eosinophils Relative: 3 %
HCT: 31.4 % — ABNORMAL LOW (ref 39.0–52.0)
Hemoglobin: 9.9 g/dL — ABNORMAL LOW (ref 13.0–17.0)
Immature Granulocytes: 1 %
Lymphocytes Relative: 8 %
Lymphs Abs: 0.5 10*3/uL — ABNORMAL LOW (ref 0.7–4.0)
MCH: 29.1 pg (ref 26.0–34.0)
MCHC: 31.5 g/dL (ref 30.0–36.0)
MCV: 92.4 fL (ref 80.0–100.0)
Monocytes Absolute: 0.5 10*3/uL (ref 0.1–1.0)
Monocytes Relative: 8 %
Neutro Abs: 4.8 10*3/uL (ref 1.7–7.7)
Neutrophils Relative %: 79 %
Platelets: 153 10*3/uL (ref 150–400)
RBC: 3.4 MIL/uL — ABNORMAL LOW (ref 4.22–5.81)
RDW: 15.1 % (ref 11.5–15.5)
WBC: 6 10*3/uL (ref 4.0–10.5)
nRBC: 0 % (ref 0.0–0.2)

## 2021-04-17 LAB — COMPREHENSIVE METABOLIC PANEL
ALT: 17 U/L (ref 0–44)
AST: 22 U/L (ref 15–41)
Albumin: 2.9 g/dL — ABNORMAL LOW (ref 3.5–5.0)
Alkaline Phosphatase: 84 U/L (ref 38–126)
Anion gap: 9 (ref 5–15)
BUN: 36 mg/dL — ABNORMAL HIGH (ref 8–23)
CO2: 27 mmol/L (ref 22–32)
Calcium: 8.3 mg/dL — ABNORMAL LOW (ref 8.9–10.3)
Chloride: 105 mmol/L (ref 98–111)
Creatinine, Ser: 2.66 mg/dL — ABNORMAL HIGH (ref 0.61–1.24)
GFR, Estimated: 25 mL/min — ABNORMAL LOW (ref >60)
Glucose, Bld: 216 mg/dL — ABNORMAL HIGH (ref 70–99)
Potassium: 4 mmol/L (ref 3.5–5.1)
Sodium: 141 mmol/L (ref 135–145)
Total Bilirubin: 0.7 mg/dL (ref 0.3–1.2)
Total Protein: 6.8 g/dL (ref 6.5–8.1)

## 2021-04-17 LAB — TROPONIN I (HIGH SENSITIVITY): Troponin I (High Sensitivity): 74 ng/L — ABNORMAL HIGH (ref <18)

## 2021-04-17 LAB — RESP PANEL BY RT-PCR (FLU A&B, COVID) ARPGX2
Influenza A by PCR: NEGATIVE
Influenza B by PCR: NEGATIVE
SARS Coronavirus 2 by RT PCR: NEGATIVE

## 2021-04-17 LAB — BRAIN NATRIURETIC PEPTIDE: B Natriuretic Peptide: 250.6 pg/mL — ABNORMAL HIGH (ref 0.0–100.0)

## 2021-04-17 MED ORDER — IPRATROPIUM-ALBUTEROL 0.5-2.5 (3) MG/3ML IN SOLN
3.0000 mL | Freq: Once | RESPIRATORY_TRACT | Status: AC
  Administered 2021-04-17: 3 mL via RESPIRATORY_TRACT
  Filled 2021-04-17: qty 3

## 2021-04-17 MED ORDER — ACETAMINOPHEN 325 MG PO TABS
650.0000 mg | ORAL_TABLET | ORAL | Status: DC | PRN
  Administered 2021-04-23 – 2021-04-30 (×13): 650 mg via ORAL
  Filled 2021-04-17 (×14): qty 2

## 2021-04-17 MED ORDER — SODIUM CHLORIDE 0.9 % IV SOLN
250.0000 mL | INTRAVENOUS | Status: DC | PRN
  Administered 2021-04-29: 250 mL via INTRAVENOUS

## 2021-04-17 MED ORDER — HYDRALAZINE HCL 50 MG PO TABS
50.0000 mg | ORAL_TABLET | Freq: Three times a day (TID) | ORAL | Status: DC
  Administered 2021-04-18 – 2021-04-19 (×6): 50 mg via ORAL
  Filled 2021-04-17 (×6): qty 1

## 2021-04-17 MED ORDER — METHYLPREDNISOLONE SODIUM SUCC 125 MG IJ SOLR
120.0000 mg | INTRAMUSCULAR | Status: AC
Start: 2021-04-18 — End: 2021-04-18
  Administered 2021-04-18: 120 mg via INTRAVENOUS
  Filled 2021-04-17: qty 2

## 2021-04-17 MED ORDER — ONDANSETRON HCL 4 MG/2ML IJ SOLN
4.0000 mg | Freq: Four times a day (QID) | INTRAMUSCULAR | Status: DC | PRN
  Administered 2021-04-30: 4 mg via INTRAVENOUS
  Filled 2021-04-17: qty 2

## 2021-04-17 MED ORDER — METHYLPREDNISOLONE SODIUM SUCC 125 MG IJ SOLR
125.0000 mg | Freq: Once | INTRAMUSCULAR | Status: AC
  Administered 2021-04-17: 125 mg via INTRAVENOUS
  Filled 2021-04-17: qty 2

## 2021-04-17 MED ORDER — PREDNISONE 20 MG PO TABS: 40.0000 mg | ORAL_TABLET | Freq: Every day | ORAL | Status: DC

## 2021-04-17 MED ORDER — GUAIFENESIN-DM 100-10 MG/5ML PO SYRP: 5.0000 mL | ORAL_SOLUTION | ORAL | Status: DC | PRN

## 2021-04-17 MED ORDER — IPRATROPIUM-ALBUTEROL 0.5-2.5 (3) MG/3ML IN SOLN
3.0000 mL | Freq: Four times a day (QID) | RESPIRATORY_TRACT | Status: DC
  Administered 2021-04-18 – 2021-04-29 (×43): 3 mL via RESPIRATORY_TRACT
  Filled 2021-04-17 (×44): qty 3

## 2021-04-17 MED ORDER — INSULIN ASPART 100 UNIT/ML IJ SOLN
0.0000 [IU] | Freq: Three times a day (TID) | INTRAMUSCULAR | Status: DC
  Administered 2021-04-18 (×3): 8 [IU] via SUBCUTANEOUS
  Administered 2021-04-19: 15 [IU] via SUBCUTANEOUS
  Administered 2021-04-19 (×2): 11 [IU] via SUBCUTANEOUS
  Administered 2021-04-20: 15 [IU] via SUBCUTANEOUS
  Administered 2021-04-20: 11 [IU] via SUBCUTANEOUS
  Administered 2021-04-20: 5 [IU] via SUBCUTANEOUS
  Administered 2021-04-21: 8 [IU] via SUBCUTANEOUS
  Administered 2021-04-21: 15 [IU] via SUBCUTANEOUS
  Administered 2021-04-21 – 2021-04-22 (×2): 11 [IU] via SUBCUTANEOUS
  Administered 2021-04-22 (×2): 8 [IU] via SUBCUTANEOUS
  Administered 2021-04-23: 15 [IU] via SUBCUTANEOUS
  Administered 2021-04-23: 5 [IU] via SUBCUTANEOUS
  Administered 2021-04-23 – 2021-04-24 (×2): 8 [IU] via SUBCUTANEOUS
  Administered 2021-04-24: 11 [IU] via SUBCUTANEOUS
  Administered 2021-04-24: 15 [IU] via SUBCUTANEOUS
  Administered 2021-04-25 (×2): 5 [IU] via SUBCUTANEOUS
  Administered 2021-04-25: 8 [IU] via SUBCUTANEOUS
  Administered 2021-04-26: 5 [IU] via SUBCUTANEOUS
  Administered 2021-04-26 – 2021-04-27 (×3): 3 [IU] via SUBCUTANEOUS
  Administered 2021-04-27: 5 [IU] via SUBCUTANEOUS
  Administered 2021-04-27: 3 [IU] via SUBCUTANEOUS
  Administered 2021-04-28: 2 [IU] via SUBCUTANEOUS
  Administered 2021-04-29 (×3): 3 [IU] via SUBCUTANEOUS
  Administered 2021-04-30: 5 [IU] via SUBCUTANEOUS
  Administered 2021-04-30 (×2): 2 [IU] via SUBCUTANEOUS
  Filled 2021-04-17 (×35): qty 1

## 2021-04-17 MED ORDER — LIDOCAINE VISCOUS HCL 2 % MT SOLN
15.0000 mL | Freq: Once | OROMUCOSAL | Status: AC
  Administered 2021-04-17: 15 mL via ORAL
  Filled 2021-04-17: qty 15

## 2021-04-17 MED ORDER — CLONIDINE HCL 0.1 MG PO TABS
0.2000 mg | ORAL_TABLET | Freq: Once | ORAL | Status: AC
  Administered 2021-04-17: 0.2 mg via ORAL
  Filled 2021-04-17: qty 2

## 2021-04-17 MED ORDER — NITROGLYCERIN 0.4 MG SL SUBL
0.4000 mg | SUBLINGUAL_TABLET | SUBLINGUAL | Status: DC | PRN
  Administered 2021-04-19 (×2): 0.4 mg via SUBLINGUAL
  Filled 2021-04-17 (×2): qty 1

## 2021-04-17 MED ORDER — FUROSEMIDE 10 MG/ML IJ SOLN
40.0000 mg | Freq: Once | INTRAMUSCULAR | Status: AC
  Administered 2021-04-17: 40 mg via INTRAVENOUS
  Filled 2021-04-17: qty 4

## 2021-04-17 MED ORDER — FUROSEMIDE 10 MG/ML IJ SOLN
40.0000 mg | Freq: Two times a day (BID) | INTRAMUSCULAR | Status: DC
  Administered 2021-04-18 – 2021-04-19 (×3): 40 mg via INTRAVENOUS
  Filled 2021-04-17 (×3): qty 4

## 2021-04-17 MED ORDER — ASPIRIN EC 81 MG PO TBEC
81.0000 mg | DELAYED_RELEASE_TABLET | Freq: Every day | ORAL | Status: DC
  Administered 2021-04-18 – 2021-04-30 (×13): 81 mg via ORAL
  Filled 2021-04-17 (×13): qty 1

## 2021-04-17 MED ORDER — SODIUM CHLORIDE 0.9% FLUSH: 3.0000 mL | INTRAVENOUS | Status: DC | PRN

## 2021-04-17 MED ORDER — ALBUTEROL SULFATE (2.5 MG/3ML) 0.083% IN NEBU
2.5000 mg | INHALATION_SOLUTION | RESPIRATORY_TRACT | Status: DC | PRN
  Administered 2021-04-18: 2.5 mg via RESPIRATORY_TRACT
  Filled 2021-04-17: qty 3

## 2021-04-17 MED ORDER — ALUM & MAG HYDROXIDE-SIMETH 200-200-20 MG/5ML PO SUSP
30.0000 mL | Freq: Once | ORAL | Status: AC
  Administered 2021-04-17: 30 mL via ORAL
  Filled 2021-04-17: qty 30

## 2021-04-17 MED ORDER — ENOXAPARIN SODIUM 40 MG/0.4ML IJ SOSY: 40.0000 mg | PREFILLED_SYRINGE | INTRAMUSCULAR | Status: DC

## 2021-04-17 MED ORDER — HYDROCODONE-ACETAMINOPHEN 5-325 MG PO TABS
1.0000 | ORAL_TABLET | Freq: Four times a day (QID) | ORAL | Status: DC | PRN
  Administered 2021-04-19 – 2021-04-30 (×28): 1 via ORAL
  Filled 2021-04-17 (×29): qty 1

## 2021-04-17 MED ORDER — SODIUM CHLORIDE 0.9% FLUSH
3.0000 mL | Freq: Two times a day (BID) | INTRAVENOUS | Status: DC
  Administered 2021-04-18 – 2021-04-30 (×24): 3 mL via INTRAVENOUS

## 2021-04-17 MED ORDER — CARVEDILOL 6.25 MG PO TABS
3.1250 mg | ORAL_TABLET | Freq: Two times a day (BID) | ORAL | Status: DC
  Administered 2021-04-18: 3.125 mg via ORAL
  Filled 2021-04-17: qty 1

## 2021-04-17 MED ORDER — INSULIN ASPART 100 UNIT/ML IJ SOLN
0.0000 [IU] | Freq: Every day | INTRAMUSCULAR | Status: DC
Start: 2021-04-17 — End: 2021-04-30
  Administered 2021-04-19: 2 [IU] via SUBCUTANEOUS
  Administered 2021-04-20 – 2021-04-21 (×2): 5 [IU] via SUBCUTANEOUS
  Administered 2021-04-22: 3 [IU] via SUBCUTANEOUS
  Administered 2021-04-23: 5 [IU] via SUBCUTANEOUS
  Administered 2021-04-24: 3 [IU] via SUBCUTANEOUS
  Administered 2021-04-25: 2 [IU] via SUBCUTANEOUS
  Filled 2021-04-17 (×7): qty 1

## 2021-04-17 MED ORDER — CLONIDINE HCL 0.1 MG PO TABS: 0.3000 mg | ORAL_TABLET | Freq: Once | ORAL | Status: DC

## 2021-04-17 NOTE — H&P (Signed)
History and Physical    Stephen [name] VCY:[id number] DOB: [birth year] DOA: 04/17/2021  PCP: [name], MD   Patient coming from: home  I have personally briefly reviewed patient's old medical records in [facility]  Chief Complaint: shortness of breath  HPI: Stephen [name] is a 75 y.o. male with medical history significant for Moderate persistent asthma, interstitial lung disease, pulmonary hypertension, OSA with chronic respiratory failure on home O2 at 4 L, followed by pulmonology, as well as diastolic heart failure, CAD, DM, CKD IV, who was brought in by EMS in respiratory distress.  Reports a one week history of progressively worsening shortness of breath not improving after increasing home oxygen to 6-1/2 L from his baseline of 6L.  He has mild productive cough with yellowish-white phlegm but denies fever or chills.  He denies chest pain, nausea vomiting or abdominal pain and denies diarrhea.  He has mild lower extremity edema but without pain. Wife contributes to history as patient is on BiPAP  ED course: On arrival afebrile, BP 155/107 going as high as 204/75 with pulse in the 50s to 60s.  O2 sats 100% on 6 L but with increased work of breathing requiring transition to BiPAP Blood work troponin 74 with BNP 250.  WBC 6 with lactic acid 1.2.  Creatinine 2.66 which is about his baseline for CKD 4.  EKG, personally viewed and interpreted: Sinus rhythm at 62 with no acute ST-T wave changes  Imaging: Chest x-ray.  Persistent airspace opacities stable from prior exam on 10/4  Patient treated with duo nebs and Solu-Medrol as well as IV Lasix and given on oral clonidine.  Hospitalist consulted for admission.  Review of Systems: As per HPI otherwise all other systems on review of systems negative.    Past Medical History:  Diagnosis Date   Adrenal gland anomaly    Arthritis    CAD (coronary artery disease)    a. 04/2006 MI and PCI/stenting to mLCx & mRCA; b. 07/2009 Cath:  patent LCX/RCA stents;  c. 12/2016 NSTEMI/Cath: LM 30, LAD 30p, 42m, 30d, D1/2/3 min irregs, LCX 169m ISR, OM1 min irregs, RCA 20p, 10 ISR, 76m, 50d, RPDA min irregs, RPAV 40-->Med Rx; d. 12/2017 MV: fixed lateral wall scar, mild anterior/anterior septal ischemia. EF 30-44%.   CHF (congestive heart failure) (HCC)    CKD (chronic kidney disease), stage III (HCC)    Colon polyps    DM2 (diabetes mellitus, type 2) (HCC)    insulin requiring   Dyslipidemia    GERD (gastroesophageal reflux disease)    History of echocardiogram    a. TTE 12/2016: EF 50-55%, mod concentric LVH, images inadequate for wall motion assessment, not technically sufficienct to allow for LV dias fxn, calcified mitral annulus    HTN (hypertension)    Kidney stones    Left Renal artery stenosis (HCC)    a. 07/2013 s/p PTA/stenting ([facility]); b. 07/2017 Renal Duplex: No significant RAS.   Myocardial infarction (HCC)    OSA (obstructive sleep apnea)    Pulmonary fibrosis (HCC)    a. 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   Recurrent UTI    Rotator cuff disorder    right   Skin cancer    head   Thyroid disorder    Venous insufficiency of both lower extremities    a. s/p laser treatment.    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   CARPAL TUNNEL RELEASE     bilateral   CATARACT EXTRACTION W/PHACO Right 07/08/2018   Procedure: CATARACT EXTRACTION PHACO AND INTRAOCULAR LENS PLACEMENT (IOC) RIGHT DIABETIC;  Surgeon: Nevada Crane, MD;  Location: [facility];  Service: Ophthalmology;  Laterality: Right;  Diabetic - insulin sleep apnea   CATARACT EXTRACTION W/PHACO Left 08/05/2018   Procedure: CATARACT EXTRACTION PHACO AND INTRAOCULAR LENS PLACEMENT (IOC) LEFT DIABETIC;  Surgeon: Nevada Crane, MD;  Location: [facility];  Service: Ophthalmology;  Laterality: Left;  DIABETIC   CORONARY ANGIOGRAPHY N/A  08/19/2020   Procedure: CORONARY ANGIOGRAPHY;  Surgeon: Antonieta Iba, MD;  Location: [facility] INVASIVE CV LAB;  Service: Cardiovascular;  Laterality: N/A;   CORONARY ANGIOPLASTY WITH STENT PLACEMENT  2007   x 2   KNEE SURGERY     right   LEFT HEART CATH AND CORONARY ANGIOGRAPHY N/A 01/11/2017   Procedure: Left Heart Cath and Coronary Angiography;  Surgeon: Iran Ouch, MD;  Location: [facility] INVAS[facility] CV LAB;  Service: Cardiovascular;  Laterality: N/A;   LUNG BIOPSY     RENAL ARTERY STENT  2015   L   RIGHT HEART CATH Right 12/06/2020   Procedure: RIGHT HEART CATH;  Surgeon: Iran Ouch, MD;  Location: [facility] INVASIVE CV LAB;  Service: Cardiovascular;  Laterality: Right;   SKIN CANCER EXCISION     back of head     reports that he has never smoked. He has never used smokeless tobacco. He reports that he does not drink alcohol and does not use drugs.  Allergies  Allergen Reactions   Calcium Channel Blockers     Would avoid if possible due to h/o peripheral edema    Family History  Problem Relation Age of Onset   Heart attack Father    Heart attack Mother    Heart attack Brother    COPD Sister    Arthritis Maternal Grandmother    Breast cancer Maternal Aunt    Diabetes Maternal Aunt    Hypertension Other    Hypertension Maternal Aunt    Prostate cancer Neg Hx    Kidney cancer Neg Hx    Bladder Cancer Neg Hx    Colon cancer Neg Hx       Prior to Admission medications   Medication Sig Start Date End Date Taking? Authorizing Provider  ACCU-CHEK AVIVA PLUS test strip USE AS INSTRUCTED TO CHECK BLOOD SUGAR 3 TIMES DAILY OR AS NEEDED 03/16/21   [name], MD  ACCU-CHEK SOFTCLIX LANCETS lancets Use as instructed to check blood sugar three times daily and as needed.  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 05/16/17   [name], MD  albuterol (VENTOLIN HFA) 108 (90 Base) MCG/ACT inhaler INHALE 2 PUFFS INTO THE LUNGS EVERY 6 HOURS AS NEEDED FOR WHEEZING OR SHORTNESS OF  BREATH. DISPENSE 3 INHALERS 11/01/20   [name], MD  Alcohol Swabs (B-D SINGLE USE SWABS REGULAR) PADS USE TO CLEANS AREA PRIOR TO CHECKING BLOOD SUGAR THREE TIMES DAILY AND AS NEEDED 09/29/20   [name], MD  aspirin EC 81 MG tablet Take 1 tablet (81 mg total) by mouth daily. Swallow whole. 11/25/20   [name], MD  atorvastatin (LIPITOR) 80 MG tablet TAKE 1 TABLET AT BEDTIME 01/12/21   [name], MD  Blood Glucose Monitoring Suppl (ACCU-CHEK AVIVA PLUS) w/Device KIT Use to check blood sugar three times daily and as needed.  Diagnosis: E11.22  Insulin-dependent 07/01/19  [name], MD  calcitRIOL (ROCALTROL) 0.25 MCG capsule Take 0.25 mcg by mouth daily.    [provider]  carvedilol (COREG) 12.5 MG tablet TAKE 1 TABLET TWO TIMES DAILY WITH MEALS 01/12/21   [name], MD  cloNIDine (CATAPRES) 0.3 MG tablet TAKE 1 TABLET THREE TIMES DAILY 12/07/20   Minna Merritts, MD  clopidogrel (PLAVIX) 75 MG tablet TAKE 1 TABLET EVERY DAY 09/29/20   Loel Dubonnet, NP  diphenhydrAMINE (BENADRYL) 50 MG tablet Take 50 mg by mouth at bedtime as needed for allergies.    [provider]  dorzolamide-timolol (COSOPT) 22.3-6.8 MG/ML ophthalmic solution Place 1 drop into both eyes in the morning and at bedtime. 12/05/19   [provider]  DROPLET INSULIN SYRINGE 30G X 5/16" 1 ML MISC USE AS DIRECTED TO INJECT INSULIN 12/02/20   [name], MD  ezetimibe (ZETIA) 10 MG tablet TAKE 1 TABLET EVERY DAY 09/29/20   Minna Merritts, MD  fluticasone (FLONASE) 50 MCG/ACT nasal spray Place 2 sprays into both nostrils daily. 11/25/20   [name], MD  Fluticasone-Umeclidin-Vilant (TRELEGY ELLIPTA) 200-62.5-25 MCG/INH AEPB Inhale 200 mcg into the lungs daily. 03/30/21   Tyler Pita, MD  Fluticasone-Umeclidin-Vilant (TRELEGY ELLIPTA) 200-62.5-25 MCG/INH AEPB Inhale 1 puff into the lungs daily. 04/05/21   Tyler Pita, MD  gabapentin (NEURONTIN) 400  MG capsule TAKE 2 CAPSULES IN THE MORNING  AND TAKE 3 CAPSULES EVERY NIGHT 03/25/21   [name], MD  hydrALAZINE (APRESOLINE) 50 MG tablet Take 1 tablet (50 mg total) by mouth 3 (three) times daily. 12/13/20 03/13/21  Marrianne Mood D, PA-C  HYDROcodone-acetaminophen (NORCO/VICODIN) 5-325 MG tablet Take 1 tablet by mouth every 6 (six) hours as needed for moderate pain. 07/26/20   [name], MD  insulin NPH-regular Human (70-30) 100 UNIT/ML injection Inject 40-60 Units into the skin as directed. Take 60 units in the morning and 40 units in the evening    [provider]  isosorbide mononitrate (IMDUR) 60 MG 24 hr tablet TAKE 1 AND 1/2 TABLETS TWICE DAILY 12/07/20   Minna Merritts, MD  levothyroxine (SYNTHROID) 25 MCG tablet take 2 of the 25 mcg tablets Monday through Saturday with 1 tablet on Sunday.  13 pills/week. 02/23/21   [name], MD  montelukast (SINGULAIR) 10 MG tablet Take 1 tablet by mouth once daily 04/05/21   Tyler Pita, MD  nitroGLYCERIN (NITROSTAT) 0.4 MG SL tablet Place 1 tablet (0.4 mg total) under the tongue every 5 (five) minutes as needed. May repeat x3 03/27/19   [name], MD  omeprazole (PRILOSEC) 20 MG capsule Take 20 mg by mouth daily before breakfast.    [provider]  potassium chloride (KLOR-CON) 10 MEQ tablet Take 2 tablets (20 meq) by mouth once daily 12/14/20   Marrianne Mood D, PA-C  primidone (MYSOLINE) 50 MG tablet Take 0.5 tablets (25 mg total) by mouth in the morning. For tremor 02/23/21   [name], MD  Sennosides (EX-LAX) 15 MG CHEW Chew 15 mg by mouth 2 (two) times daily as needed (constipation).    [provider]  sildenafil (REVATIO) 20 MG tablet Take 1 tablet (20 mg total) by mouth 3 (three) times daily. 11/11/20   Minna Merritts, MD  tamsulosin (FLOMAX) 0.4 MG CAPS capsule TAKE 1 CAPSULE AT BEDTIME 01/12/21   [name], MD  torsemide (DEMADEX) 20 MG tablet Take 2 tablets (40 mg) by  mouth once daily 12/14/20   Arvil Chaco, PA-C  TRELEGY ELLIPTA 200-62.5-25 MCG/INH AEPB INHALE 1 PUFF ONCE DAILY 03/08/21   Tyler Pita, MD    Physical Exam: Vitals:   04/17/21 2200 04/17/21 2218 04/17/21 2230 04/17/21 2233  BP: (!) 211/68 (!) 210/67 (!) 215/68 (!) 215/68  Pulse: (!) 54 (!) 56 (!) 54   Resp: 16 (!) 21 18   SpO2: 100% 99% 100%      Vitals:   04/17/21 2200 04/17/21 2218 04/17/21 2230 04/17/21 2233  BP: (!) 211/68 (!) 210/67 (!) 215/68 (!) 215/68  Pulse: (!) 54 (!) 56 (!) 54   Resp: 16 (!) 21 18   SpO2: 100% 99% 100%       Constitutional: Alert and oriented x 3 . Mild distress, on bipap HEENT:      Head: Normocephalic and atraumatic.         Eyes: PERLA, EOMI, Conjunctivae are normal. Sclera is non-icteric.       Mouth/Throat: Mucous membranes are moist.       Neck: Supple with no signs of meningismus. Cardiovascular: Regular rate and rhythm. No murmurs, gallops, or rubs. 2+ symmetrical distal pulses are present . No JVD. 2+LE edema Respiratory: Respiratory effort increased .Lungs sounds diminished bilaterally.  Gastrointestinal: Soft, non tender, and non distended with positive bowel sounds.  Genitourinary: No CVA tenderness. Musculoskeletal: Nontender with normal range of motion in all extremities. No cyanosis, or erythema of extremities. Neurologic:  Face is symmetric. Moving all extremities. No gross focal neurologic deficits . Skin: Skin is warm, dry.  No rash or ulcers Psychiatric: Mood and affect are normal    Labs on Admission: I have personally reviewed following labs and imaging studies  CBC: Recent Labs  Lab 04/17/21 2101  WBC 6.0  NEUTROABS 4.8  HGB 9.9*  HCT 31.4*  MCV 92.4  PLT 222   Basic Metabolic Panel: Recent Labs  Lab 04/17/21 2101  NA 141  K 4.0  CL 105  CO2 27  GLUCOSE 216*  BUN 36*  CREATININE 2.66*  CALCIUM 8.3*   GFR: CrCl cannot be calculated (Unknown ideal weight.). Liver Function Tests: Recent  Labs  Lab 04/17/21 2101  AST 22  ALT 17  ALKPHOS 84  BILITOT 0.7  PROT 6.8  ALBUMIN 2.9*   No results for input(s): LIPASE, AMYLASE in the last 168 hours. No results for input(s): AMMONIA in the last 168 hours. Coagulation Profile: No results for input(s): INR, PROTIME in the last 168 hours. Cardiac Enzymes: No results for input(s): CKTOTAL, CKMB, CKMBINDEX, TROPONINI in the last 168 hours. BNP (last 3 results) No results for input(s): PROBNP in the last 8760 hours. HbA1C: No results for input(s): HGBA1C in the last 72 hours. CBG: No results for input(s): GLUCAP in the last 168 hours. Lipid Profile: No results for input(s): CHOL, HDL, LDLCALC, TRIG, CHOLHDL, LDLDIRECT in the last 72 hours. Thyroid Function Tests: No results for input(s): TSH, T4TOTAL, FREET4, T3FREE, THYROIDAB in the last 72 hours. Anemia Panel: No results for input(s): VITAMINB12, FOLATE, FERRITIN, TIBC, IRON, RETICCTPCT in the last 72 hours. Urine analysis:    Component Value Date/Time   COLORURINE YELLOW (A) 09/20/2018 0942   APPEARANCEUR CLEAR (A) 09/20/2018 0942   LABSPEC 1.017 09/20/2018 [place] 5.0 09/20/2018 0942   GLUCOSEU 50 (A) 09/20/2018 0942   HGBUR SMALL (A) 09/20/2018 [place] NEGATIVE 09/20/2018 0942   BILIRUBINUR neg 12/06/2010 1627   KETONESUR NEGATIVE 09/20/2018 9798  PROTEINUR 100 (A) 09/20/2018 0942   UROBILINOGEN 0.2 12/06/2010 1627   NITRITE NEGATIVE 09/20/2018 0942   LEUKOCYTESUR NEGATIVE 09/20/2018 0942    Radiological Exams on Admission: DG Chest Portable 1 View  Result Date: 04/17/2021 CLINICAL DATA:  Shortness of breath EXAM: PORTABLE CHEST 1 VIEW COMPARISON:  04/05/2021 FINDINGS: Cardiac shadow is prominent but stable. Aortic calcifications are again seen. Lungs are well aerated bilaterally with persistent airspace opacities bilaterally stable from the prior exam. No bony abnormality is noted. IMPRESSION: No significant interval change from the prior  study. Electronically Signed   By: Inez Catalina M.D.   On: 04/17/2021 21:11     Assessment/Plan    Acute on chronic respiratory failure with hypoxemia  - Patient presenting in acute respiratory distress, speaking in short sentences, requiring BiPAP - Etiology related to COPD and CHF exacerbation superimposed on chronic underlying pulmonary conditions including ILD, OSA, PAH - Continue BiPAP and wean as tolerated - Treat acute issues as outlined below - Consider pulmonology consult in the a.m.   Acute on chronic diastolic CHF  Suspect hypertensive emergency -Shortness of breath, SBP in the low 200s with a BNP 259 bilateral airspace disease on chest x-ray though unchanged from 10/4 during an earlier visit to the ED - Last echo February with EF 55 to 60% - Had right heart cath June 2022 showing mild pulmonary hypertension - IV Lasix - Continue Imdur, carvedilol pending med rec - Blood pressure control - Daily weights with intake and output monitoring - We will repeat echo    COPD with acute exacerbation (HCC) - Schedule and as needed nebulized bronchodilator treatments - IV steroids - Supplemental oxygen as above  Elevated troponin CAD (coronary artery disease) - Troponin 74 and EKG nonacute and patient did have chest tightness associated with wheezing - Echo to evaluate for wall motion abnormality - Continue to trend troponin  Hypertensive emergency -Systolic in the low 852D on arrival - Continue home carvedilol, clonidine, hydralazine, Imdur - As needed's to maintain BP control    Pulmonary hypertension, primary (HCC)   ILD (interstitial lung disease) (HCC)   OSA - Last seen by pulmonology in August 2022 - Consider pulmonology consult to assess for progression    CKD (chronic kidney disease) stage 4,  -creatinine 2.66 at baseline    Type 2 diabetes mellitus with renal complication (HCC) -Sliding scale insulin coverage    DVT prophylaxis: Lovenox  Code Status: full  code  Family Communication:  wife Disposition Plan: Back to previous home environment Consults called: none  Status:At the time of admission, it appears that the appropriate admission status for this patient is INPATIENT. This is judged to be reasonable and necessary in order to provide the required intensity of service to ensure the patient's safety given the presenting symptoms, physical exam findings, and initial radiographic and laboratory data in the context of their  Comorbid conditions.   Patient requires inpatient status due to high intensity of service, high risk for further deterioration and high frequency of surveillance required.   I certify that at the point of admission it is my clinical judgment that the patient will require inpatient hospital care spanning beyond [place] MD [facility]     04/17/2021, 10:57 PM

## 2021-04-17 NOTE — ED Triage Notes (Signed)
Patient BIB [facility] for evaluation of SHOB and chest pain.  Reports increased trouble breathing tonight.  Baseline 3 L O2 at home.  Hx of CHF.  Has Nitro Paste 1.5 inches to L chest.  On CPAP with 8 L [place] on arrival.  Patient was noted to be 72% on EMS arrival. Improved to 96% on CPAP with 8 L [place]

## 2021-04-17 NOTE — ED Provider Notes (Addendum)
[facility] Emergency Department Provider Note  ____________________________________________   Event Date/Time   First MD Initiated Contact with Patient 04/17/21 2049     (approximate)  I have reviewed the triage vital signs and the nursing notes.   HISTORY  Chief Complaint Shortness of Breath and Chest Pain    HPI Stephen [name] is a 75 y.o. male with extensive past medical history including CHF, CKD, interstitial lung disease, COPD, here with shortness of breath.  Patient arrives in moderate respiratory distress.  He reportedly called EMS due to shortness of breath.  He was actually just here about a week ago with similar symptoms but left due to a prolonged wait.  He states he has had progressive worsening shortness of breath.  However, this acutely worsened this afternoon causing him to call EMS.  He has been increasing his home oxygen to up to 6-1/2 L.  He was found to be satting in the 70s on his 6 L.  Is placed on CPAP.  He has had some mild improvement since then.  He currently states he feels better since being placed on CPAP.  Endorses shortness of breath and mild increase sputum production.  Denies any known fevers.  He has had some mild increase in leg swelling but not severe.  No chest pain.  No known fevers.    Past Medical History:  Diagnosis Date   Adrenal gland anomaly    Arthritis    CAD (coronary artery disease)    a. 04/2006 MI and PCI/stenting to mLCx & mRCA; b. 07/2009 Cath: patent LCX/RCA stents;  c. 12/2016 NSTEMI/Cath: LM 30, LAD 30p, 75m, 30d, D1/2/3 min irregs, LCX 175m ISR, OM1 min irregs, RCA 20p, 10 ISR, 47m, 50d, RPDA min irregs, RPAV 40-->Med Rx; d. 12/2017 MV: fixed lateral wall scar, mild anterior/anterior septal ischemia. EF 30-44%.   CHF (congestive heart failure) (HCC)    CKD (chronic kidney disease), stage III (HCC)    Colon polyps    DM2 (diabetes mellitus, type 2) (HCC)    insulin requiring   Dyslipidemia    GERD  (gastroesophageal reflux disease)    History of echocardiogram    a. TTE 12/2016: EF 50-55%, mod concentric LVH, images inadequate for wall motion assessment, not technically sufficienct to allow for LV dias fxn, calcified mitral annulus    HTN (hypertension)    Kidney stones    Left Renal artery stenosis (HCC)    a. 07/2013 s/p PTA/stenting ([place]); b. 07/2017 Renal Duplex: No significant RAS.   Myocardial infarction (HCC)    OSA (obstructive sleep apnea)    Pulmonary fibrosis (HCC)    a. 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   Recurrent UTI    Rotator cuff disorder    right   Skin cancer    head   Thyroid disorder    Venous insufficiency of both lower extremities    a. s/p laser treatment.    Patient Active Problem List   Diagnosis Date Noted   Acute on chronic respiratory failure with hypoxemia (HCC) 04/17/2021   CKD (chronic kidney disease) stage 4, GFR 15-29 ml/min (HCC)    ILD (interstitial lung disease) (HCC)    COPD with acute exacerbation (HCC)    Pounding heartbeat 03/09/2021   Bruising 11/29/2020   Cough 11/29/2020   Unstable angina (HCC) 08/19/2020   Pulmonary hypertension, unspecified (HCC) 08/19/2020   Tremor 07/29/2020   Hypoxia 03/21/2020   Left leg cellulitis  09/16/2019   Asthma 03/31/2019   Renal artery stenosis ([place]) 02/24/2019   Advance care planning 12/26/2018   Hypothyroidism 12/26/2018   HLD (hyperlipidemia) 12/26/2018   Coronary artery disease involving native coronary artery of native heart with angina pectoris ([place]) 10/09/2018   Pulmonary hypertension, primary ([place]) 09/26/2018   CHF (congestive heart failure) ([place]) 09/20/2018   Knee pain 03/26/2018   Encounter for chronic pain management 12/23/2017   Healthcare maintenance 09/21/2017   Skin lesion 06/15/2017   Acute on chronic renal failure ([place]) 01/15/2017   Coronary stent occlusion: Occluded circumflex stent 01/13/2017   Accelerated hypertension  with diastolic congestive heart failure, NYHA class 3 ([place]); elevated LVEDP on chronic catheterization. 01/13/2017   NSTEMI (non-ST elevated myocardial infarction) ([place]) 01/11/2017   Blood in urine 12/08/2016   Lymphedema 10/23/2016   Low back pain with right-sided sciatica 04/03/2014   Muscle spasm 04/24/2013   Chronic edema 11/13/2012   Leg edema, right 09/29/2012   SK (seborrheic keratosis) 09/29/2012   Acute on chronic diastolic CHF (congestive heart failure) ([place]) 12/15/2011   Sebaceous cyst 11/28/2011   Morbid obesity ([place]) 07/27/2011   Hyperlipidemia with target low density lipoprotein (LDL) cholesterol less than 70 mg/dL 04/26/2011   Adrenal adenoma 03/02/2011   Back pain 12/06/2010   Kidney stones 12/06/2010   Essential hypertension, benign 10/28/2010   PULMONARY FIBROSIS 09/15/2010   CAD (coronary artery disease) 09/01/2010   Type 2 diabetes mellitus with renal complication ([place]) 47/03/6282   METABOLIC SYNDROME X 66/29/4765   OSA (obstructive sleep apnea) 08/31/2010    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   CARPAL TUNNEL RELEASE     bilateral   CATARACT EXTRACTION W/PHACO Right 07/08/2018   Procedure: CATARACT EXTRACTION PHACO AND INTRAOCULAR LENS PLACEMENT ([place]) RIGHT DIABETIC;  Surgeon: Eulogio Bear, MD;  Location: [place];  Service: Ophthalmology;  Laterality: Right;  Diabetic - insulin sleep apnea   CATARACT EXTRACTION W/PHACO Left 08/05/2018   Procedure: CATARACT EXTRACTION PHACO AND INTRAOCULAR LENS PLACEMENT ([place]) LEFT DIABETIC;  Surgeon: Eulogio Bear, MD;  Location: [place];  Service: Ophthalmology;  Laterality: Left;  DIABETIC   CORONARY ANGIOGRAPHY N/A 08/19/2020   Procedure: CORONARY ANGIOGRAPHY;  Surgeon: Minna Merritts, MD;  Location: [facility];  Service: Cardiovascular;  Laterality: N/A;   CORONARY ANGIOPLASTY WITH STENT PLACEMENT  2007   x 2   KNEE SURGERY      right   LEFT HEART CATH AND CORONARY ANGIOGRAPHY N/A 01/11/2017   Procedure: Left Heart Cath and Coronary Angiography;  Surgeon: Wellington Hampshire, MD;  Location: [facility];  Service: Cardiovascular;  Laterality: N/A;   LUNG BIOPSY     RENAL ARTERY STENT  2015   L   RIGHT HEART CATH Right 12/06/2020   Procedure: RIGHT HEART CATH;  Surgeon: Wellington Hampshire, MD;  Location: [facility];  Service: Cardiovascular;  Laterality: Right;   SKIN CANCER EXCISION     back of head    Prior to Admission medications   Medication Sig Start Date End Date Taking? Authorizing Provider  albuterol (VENTOLIN HFA) 108 (90 Base) MCG/ACT inhaler INHALE 2 PUFFS INTO THE LUNGS EVERY 6 HOURS AS NEEDED FOR WHEEZING OR SHORTNESS OF BREATH. DISPENSE 3 INHALERS 11/01/20  Yes [name], MD  aspirin EC 81 MG tablet Take 1 tablet (81 mg total) by mouth daily. Swallow whole. 11/25/20  Yes [name],  Elveria Rising, MD  atorvastatin (LIPITOR) 80 MG tablet TAKE 1 TABLET AT BEDTIME 01/12/21  Yes [name], MD  calcitRIOL (ROCALTROL) 0.25 MCG capsule Take 0.25 mcg by mouth daily.   Yes [provider]  carvedilol (COREG) 12.5 MG tablet TAKE 1 TABLET TWO TIMES DAILY WITH MEALS 01/12/21  Yes [name], MD  cloNIDine (CATAPRES) 0.3 MG tablet TAKE 1 TABLET THREE TIMES DAILY 12/07/20  Yes Minna Merritts, MD  clopidogrel (PLAVIX) 75 MG tablet TAKE 1 TABLET EVERY DAY 09/29/20  Yes Loel Dubonnet, NP  dorzolamide-timolol (COSOPT) 22.3-6.8 MG/ML ophthalmic solution Place 1 drop into both eyes in the morning and at bedtime. 12/05/19  Yes [provider]  ezetimibe (ZETIA) 10 MG tablet TAKE 1 TABLET EVERY DAY 09/29/20  Yes Gollan, Kathlene November, MD  fluticasone (FLONASE) 50 MCG/ACT nasal spray Place 2 sprays into both nostrils daily. 11/25/20  Yes [name], MD  Fluticasone-Umeclidin-Vilant (TRELEGY ELLIPTA) 200-62.5-25 MCG/INH AEPB Inhale 1 puff into the lungs daily. 04/05/21  Yes Tyler Pita, MD  furosemide (LASIX) 80 MG tablet Take 160 mg by mouth daily.   Yes [provider]  gabapentin (NEURONTIN) 400 MG capsule TAKE 2 CAPSULES IN THE MORNING  AND TAKE 3 CAPSULES EVERY NIGHT 03/25/21  Yes [name], MD  hydrALAZINE (APRESOLINE) 50 MG tablet Take 1 tablet (50 mg total) by mouth 3 (three) times daily. 12/13/20 04/18/21 Yes Visser, Jacquelyn D, PA-C  insulin NPH-regular Human (70-30) 100 UNIT/ML injection Inject 40-60 Units into the skin as directed. Take 60 units in the morning and 40 units in the evening   Yes [provider]  isosorbide mononitrate (IMDUR) 60 MG 24 hr tablet TAKE 1 AND 1/2 TABLETS TWICE DAILY 12/07/20  Yes Minna Merritts, MD  levothyroxine (SYNTHROID) 25 MCG tablet take 2 of the 25 mcg tablets Monday through Saturday with 1 tablet on Sunday.  13 pills/week. 02/23/21  Yes [name], MD  montelukast (SINGULAIR) 10 MG tablet Take 1 tablet by mouth once daily 04/05/21  Yes Tyler Pita, MD  nitroGLYCERIN (NITROSTAT) 0.4 MG SL tablet Place 1 tablet (0.4 mg total) under the tongue every 5 (five) minutes as needed. May repeat x3 03/27/19  Yes [name], MD  omeprazole (PRILOSEC) 20 MG capsule Take 20 mg by mouth daily before breakfast.   Yes [provider]  potassium chloride (KLOR-CON) 10 MEQ tablet Take 2 tablets (20 meq) by mouth once daily 12/14/20  Yes Visser, Jacquelyn D, PA-C  primidone (MYSOLINE) 50 MG tablet Take 0.5 tablets (25 mg total) by mouth in the morning. For tremor 02/23/21  Yes [name], MD  sildenafil (REVATIO) 20 MG tablet Take 1 tablet (20 mg total) by mouth 3 (three) times daily. 11/11/20  Yes Minna Merritts, MD  tamsulosin (FLOMAX) 0.4 MG CAPS capsule TAKE 1 CAPSULE AT BEDTIME 01/12/21  Yes [name], MD  torsemide (DEMADEX) 20 MG tablet Take 2 tablets (40 mg) by mouth once daily 12/14/20  Yes Visser, Jacquelyn D, PA-C  ACCU-CHEK AVIVA PLUS test strip USE AS INSTRUCTED TO CHECK  BLOOD SUGAR 3 TIMES DAILY OR AS NEEDED 03/16/21   [name], MD  ACCU-CHEK SOFTCLIX LANCETS lancets Use as instructed to check blood sugar three times daily and as needed.  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 05/16/17   [name], MD  Alcohol Swabs (B-D SINGLE USE SWABS REGULAR) PADS USE TO CLEANS AREA PRIOR TO CHECKING  BLOOD SUGAR THREE TIMES DAILY AND AS NEEDED 09/29/20   [name], MD  Blood Glucose Monitoring Suppl (ACCU-CHEK AVIVA PLUS) w/Device KIT Use to check blood sugar three times daily and as needed.  Diagnosis: E11.22  Insulin-dependent 07/01/19   [name], MD  diphenhydrAMINE (BENADRYL) 50 MG tablet Take 50 mg by mouth at bedtime as needed for allergies. Patient not taking: Reported on 04/18/2021    [provider]  [place] X 5/16" 1 ML MISC USE AS DIRECTED TO INJECT INSULIN 12/02/20   [name], MD  HYDROcodone-acetaminophen (NORCO/VICODIN) 5-325 MG tablet Take 1 tablet by mouth every 6 (six) hours as needed for moderate pain. Patient not taking: Reported on 04/18/2021 07/26/20   [name], MD  Sennosides (EX-LAX) 15 MG CHEW Chew 15 mg by mouth 2 (two) times daily as needed (constipation). Patient not taking: Reported on 04/18/2021    [provider]    Allergies Calcium channel blockers  Family History  Problem Relation Age of Onset   Heart attack Father    Heart attack Mother    Heart attack Brother    COPD Sister    Arthritis Maternal Grandmother    Breast cancer Maternal Aunt    Diabetes Maternal Aunt    Hypertension Other    Hypertension Maternal Aunt    Prostate cancer Neg Hx    Kidney cancer Neg Hx    Bladder Cancer Neg Hx    Colon cancer Neg Hx     Social History Social History   Tobacco Use   Smoking status: Never   Smokeless tobacco: Never  Vaping Use   Vaping Use: Never used  Substance Use Topics   Alcohol use: No    Alcohol/week: 0.0 standard drinks   Drug use: No     Review of Systems  Review of Systems  Unable to perform ROS: Acuity of condition  Constitutional:  Positive for fatigue. Negative for chills and fever.  HENT:  Negative for sore throat.   Respiratory:  Positive for cough, shortness of breath and wheezing.   Cardiovascular:  Negative for chest pain.  Gastrointestinal:  Negative for abdominal pain.  Genitourinary:  Negative for flank pain.  Musculoskeletal:  Negative for neck pain.  Skin:  Negative for rash and wound.  Allergic/Immunologic: Negative for immunocompromised state.  Neurological:  Negative for weakness and numbness.  Hematological:  Does not bruise/bleed easily.  All other systems reviewed and are negative.   ____________________________________________  PHYSICAL EXAM:      VITAL SIGNS: ED Triage Vitals  Enc Vitals Group     BP 04/17/21 2045 (!) 138/98     Pulse Rate 04/17/21 2045 (!) 52     Resp 04/17/21 2100 15     Temp --      Temp src --      SpO2 04/17/21 2045 93 %     Weight --      Height --      Head Circumference --      Peak Flow --      Pain Score --      Pain Loc --      Pain Edu? --      Excl. in [place]? --      Physical Exam Vitals and nursing note reviewed.  Constitutional:      General: He is in acute distress.     Appearance: He is well-developed.  HENT:     Head: Normocephalic  and atraumatic.  Eyes:     Conjunctiva/sclera: Conjunctivae normal.  Cardiovascular:     Rate and Rhythm: Regular rhythm.     Heart sounds: Normal heart sounds. No murmur heard.   No friction rub.  Pulmonary:     Effort: Pulmonary effort is normal. Tachypnea present. No respiratory distress.     Breath sounds: Examination of the right-upper field reveals wheezing. Examination of the left-upper field reveals wheezing. Examination of the right-middle field reveals wheezing. Examination of the left-middle field reveals wheezing. Examination of the right-lower field reveals wheezing and rales. Examination of the  left-lower field reveals wheezing and rales. Wheezing and rales present.  Abdominal:     General: There is no distension.     Palpations: Abdomen is soft.     Tenderness: There is no abdominal tenderness.  Musculoskeletal:     Cervical back: Neck supple.     Right lower leg: Edema present.     Left lower leg: Edema present.  Skin:    General: Skin is warm.     Capillary Refill: Capillary refill takes less than 2 seconds.  Neurological:     Mental Status: He is alert and oriented to person, place, and time.     Motor: No abnormal muscle tone.      ____________________________________________   LABS (all labs ordered are listed, but only abnormal results are displayed)  Labs Reviewed  CBC WITH DIFFERENTIAL/PLATELET - Abnormal; Notable for the following components:      Result Value   RBC 3.40 (*)    Hemoglobin 9.9 (*)    HCT 31.4 (*)    Lymphs Abs 0.5 (*)    All other components within normal limits  COMPREHENSIVE METABOLIC PANEL - Abnormal; Notable for the following components:   Glucose, Bld 216 (*)    BUN 36 (*)    Creatinine, Ser 2.66 (*)    Calcium 8.3 (*)    Albumin 2.9 (*)    GFR, Estimated 25 (*)    All other components within normal limits  BRAIN NATRIURETIC PEPTIDE - Abnormal; Notable for the following components:   B Natriuretic Peptide 250.6 (*)    All other components within normal limits  CBG MONITORING, ED - Abnormal; Notable for the following components:   Glucose-Capillary 166 (*)    All other components within normal limits  TROPONIN I (HIGH SENSITIVITY) - Abnormal; Notable for the following components:   Troponin I (High Sensitivity) 74 (*)    All other components within normal limits  RESP PANEL BY RT-PCR (FLU A&B, COVID) ARPGX2  LACTIC ACID, PLASMA  BASIC METABOLIC PANEL  HIV ANTIBODY (ROUTINE TESTING W REFLEX)  LACTIC ACID, PLASMA  TROPONIN I (HIGH SENSITIVITY)    ____________________________________________  EKG: Sinus rhythm, ventricular  rate 62.  PR 156, QRS 126, QTc 42.  No acute ST elevations or depressions. ________________________________________  RADIOLOGY All imaging, including plain films, CT scans, and ultrasounds, independently reviewed by me, and interpretations confirmed via formal radiology reads.  ED MD interpretation:   Chest x-ray: Persistent airspace opacities bilaterally, unchanged from 10/4  Official radiology report(s): DG Chest Portable 1 View  Result Date: 04/17/2021 CLINICAL DATA:  Shortness of breath EXAM: PORTABLE CHEST 1 VIEW COMPARISON:  04/05/2021 FINDINGS: Cardiac shadow is prominent but stable. Aortic calcifications are again seen. Lungs are well aerated bilaterally with persistent airspace opacities bilaterally stable from the prior exam. No bony abnormality is noted. IMPRESSION: No significant interval change from the prior study. Electronically Signed  By: Inez Catalina M.D.   On: 04/17/2021 21:11    ____________________________________________  PROCEDURES   Procedure(s) performed (including Critical Care):  .Critical Care Performed by: Duffy Bruce, MD Authorized by: Duffy Bruce, MD   Critical care provider statement:    Critical care time (minutes):  35   Critical care time was exclusive of:  Separately billable procedures and treating other patients   Critical care was necessary to treat or prevent imminent or life-threatening deterioration of the following conditions:  Cardiac failure, circulatory failure and respiratory failure   Critical care was time spent personally by me on the following activities:  Development of treatment plan with patient or surrogate, discussions with consultants, evaluation of patient's response to treatment, examination of patient, obtaining history from patient or surrogate, ordering and review of laboratory studies, ordering and performing treatments and interventions, ordering and review of radiographic studies, pulse oximetry, re-evaluation of  patient's condition and review of old charts   I assumed direction of critical care for this patient from another provider in my specialty: no     Care discussed with: admitting provider    ____________________________________________  INITIAL IMPRESSION / MDM / [place] / ED COURSE  As part of my medical decision making, I reviewed the following data within the [place] notes reviewed and incorporated, Old chart reviewed, Notes from prior ED visits, and [place] Controlled Substance Database       *MARKEEM NOREEN was evaluated in Emergency Department on 04/18/2021 for the symptoms described in the history of present illness. He was evaluated in the context of the global COVID-19 pandemic, which necessitated consideration that the patient might be at risk for infection with the SARS-CoV-2 virus that causes COVID-19. Institutional protocols and algorithms that pertain to the evaluation of patients at risk for COVID-19 are in a state of rapid change based on information released by regulatory bodies including the CDC and federal and state organizations. These policies and algorithms were followed during the patient's care in the ED.  Some ED evaluations and interventions may be delayed as a result of limited staffing during the pandemic.*     Medical Decision Making:  75 yo M here with acute on chronic hypoxic resp failure, increased WOB. Arrives on CPAP which was transitioned to BIPAP. Exam is c/w asthma vs COPD, though CHF with flash edema also a consideration. CXR shows his baseline ILD, no significant change from prior. Denies any fever, WBC normal, doubt PNA or infectious etiology. Pt does have mild BNP, trop elevation likely from demand and CHF component. He has significant HTN noted. On nitro paste, will give lasix, also steroids and nebs for underlying ILD/COPD. Will admit to medicine. CBC otherwise unremarkable. CMP with baseline CKD. Trop 74, as mentioned.  COVId pending. EKG is nonischemic.  ____________________________________________  FINAL CLINICAL IMPRESSION(S) / ED DIAGNOSES  Final diagnoses:  Acute on chronic respiratory failure with hypoxemia (HCC)  Atypical chest pain  Shortness of breath     MEDICATIONS GIVEN DURING THIS VISIT:  Medications  carvedilol (COREG) tablet 3.125 mg (has no administration in time range)  aspirin EC tablet 81 mg (has no administration in time range)  HYDROcodone-acetaminophen (NORCO/VICODIN) 5-325 MG per tablet 1 tablet (has no administration in time range)  hydrALAZINE (APRESOLINE) tablet 50 mg (50 mg Oral Given 04/18/21 0043)  nitroGLYCERIN (NITROSTAT) SL tablet 0.4 mg (has no administration in time range)  sodium chloride flush (NS) 0.9 % injection 3 mL (3 mLs Intravenous  Given 04/18/21 0043)  sodium chloride flush (NS) 0.9 % injection 3 mL (has no administration in time range)  0.9 %  sodium chloride infusion (has no administration in time range)  acetaminophen (TYLENOL) tablet 650 mg (has no administration in time range)  ondansetron (ZOFRAN) injection 4 mg (has no administration in time range)  enoxaparin (LOVENOX) injection 40 mg (has no administration in time range)  furosemide (LASIX) injection 40 mg (has no administration in time range)  insulin aspart (novoLOG) injection 0-15 Units (has no administration in time range)  insulin aspart (novoLOG) injection 0-5 Units (0 Units Subcutaneous Not Given 04/18/21 0046)  methylPREDNISolone sodium succinate (SOLU-MEDROL) 125 mg/2 mL injection 120 mg (has no administration in time range)    Followed by  predniSONE (DELTASONE) tablet 40 mg (has no administration in time range)  ipratropium-albuterol (DUONEB) 0.5-2.5 (3) MG/3ML nebulizer solution 3 mL (has no administration in time range)  albuterol (PROVENTIL) (2.5 MG/3ML) 0.083% nebulizer solution 2.5 mg (has no administration in time range)  guaiFENesin-dextromethorphan (ROBITUSSIN DM) 100-10 MG/5ML  syrup 5 mL (has no administration in time range)  ipratropium-albuterol (DUONEB) 0.5-2.5 (3) MG/3ML nebulizer solution 3 mL (3 mLs Nebulization Given 04/17/21 2238)  ipratropium-albuterol (DUONEB) 0.5-2.5 (3) MG/3ML nebulizer solution 3 mL (3 mLs Nebulization Given 04/17/21 2238)  methylPREDNISolone sodium succinate (SOLU-MEDROL) 125 mg/2 mL injection 125 mg (125 mg Intravenous Given 04/17/21 2224)  furosemide (LASIX) injection 40 mg (40 mg Intravenous Given 04/17/21 2225)  alum & mag hydroxide-simeth (MAALOX/MYLANTA) 200-200-20 MG/5ML suspension 30 mL (30 mLs Oral Given 04/17/21 2233)    And  lidocaine (XYLOCAINE) 2 % viscous mouth solution 15 mL (15 mLs Oral Given 04/17/21 2233)  cloNIDine (CATAPRES) tablet 0.2 mg (0.2 mg Oral Given 04/17/21 2233)     ED Discharge Orders     None        Note:  This document was prepared using Dragon voice recognition software and may include unintentional dictation errors.   Duffy Bruce, MD 04/17/21 2159    Duffy Bruce, MD 04/18/21 [phone]

## 2021-04-17 NOTE — Telephone Encounter (Signed)
Noted. Thanks.

## 2021-04-18 ENCOUNTER — Inpatient Hospital Stay (HOSPITAL_COMMUNITY)
Admit: 2021-04-18 | Discharge: 2021-04-18 | Disposition: A | Discharge status: Home / Self Care | Payer: Medicare HMO | Attending: Internal Medicine | Admitting: Internal Medicine

## 2021-04-18 DIAGNOSIS — I5023 Acute on chronic systolic (congestive) heart failure: Secondary | ICD-10-CM

## 2021-04-18 DIAGNOSIS — I248 Other forms of acute ischemic heart disease: Secondary | ICD-10-CM

## 2021-04-18 DIAGNOSIS — J9621 Acute and chronic respiratory failure with hypoxia: Secondary | ICD-10-CM | POA: Diagnosis not present

## 2021-04-18 DIAGNOSIS — I2510 Atherosclerotic heart disease of native coronary artery without angina pectoris: Secondary | ICD-10-CM | POA: Diagnosis not present

## 2021-04-18 DIAGNOSIS — J441 Chronic obstructive pulmonary disease with (acute) exacerbation: Secondary | ICD-10-CM

## 2021-04-18 DIAGNOSIS — N184 Chronic kidney disease, stage 4 (severe): Secondary | ICD-10-CM | POA: Diagnosis not present

## 2021-04-18 DIAGNOSIS — I5031 Acute diastolic (congestive) heart failure: Secondary | ICD-10-CM

## 2021-04-18 DIAGNOSIS — I5033 Acute on chronic diastolic (congestive) heart failure: Secondary | ICD-10-CM | POA: Diagnosis not present

## 2021-04-18 DIAGNOSIS — I270 Primary pulmonary hypertension: Secondary | ICD-10-CM

## 2021-04-18 DIAGNOSIS — I10 Essential (primary) hypertension: Secondary | ICD-10-CM

## 2021-04-18 DIAGNOSIS — I214 Non-ST elevation (NSTEMI) myocardial infarction: Principal | ICD-10-CM

## 2021-04-18 DIAGNOSIS — Z794 Long term (current) use of insulin: Secondary | ICD-10-CM

## 2021-04-18 DIAGNOSIS — G4733 Obstructive sleep apnea (adult) (pediatric): Secondary | ICD-10-CM

## 2021-04-18 DIAGNOSIS — E1121 Type 2 diabetes mellitus with diabetic nephropathy: Secondary | ICD-10-CM

## 2021-04-18 DIAGNOSIS — J849 Interstitial pulmonary disease, unspecified: Secondary | ICD-10-CM

## 2021-04-18 LAB — ECHOCARDIOGRAM COMPLETE
AR max vel: 2.84 cm2
AV Area VTI: 3.42 cm2
AV Area mean vel: 2.94 cm2
AV Mean grad: 6 mmHg
AV Peak grad: 11 mmHg
Ao pk vel: 1.66 m/s
Area-P 1/2: 7.37 cm2
MV VTI: 3.81 cm2
S' Lateral: 3.6 cm
Weight: 4256 oz

## 2021-04-18 LAB — CBG MONITORING, ED
Glucose-Capillary: 166 mg/dL — ABNORMAL HIGH (ref 70–99)
Glucose-Capillary: 265 mg/dL — ABNORMAL HIGH (ref 70–99)
Glucose-Capillary: 270 mg/dL — ABNORMAL HIGH (ref 70–99)
Glucose-Capillary: 282 mg/dL — ABNORMAL HIGH (ref 70–99)

## 2021-04-18 LAB — BASIC METABOLIC PANEL
Anion gap: 8 (ref 5–15)
BUN: 35 mg/dL — ABNORMAL HIGH (ref 8–23)
CO2: 26 mmol/L (ref 22–32)
Calcium: 8.2 mg/dL — ABNORMAL LOW (ref 8.9–10.3)
Chloride: 106 mmol/L (ref 98–111)
Creatinine, Ser: 2.74 mg/dL — ABNORMAL HIGH (ref 0.61–1.24)
GFR, Estimated: 24 mL/min — ABNORMAL LOW (ref >60)
Glucose, Bld: 283 mg/dL — ABNORMAL HIGH (ref 70–99)
Potassium: 3.8 mmol/L (ref 3.5–5.1)
Sodium: 140 mmol/L (ref 135–145)

## 2021-04-18 LAB — TROPONIN I (HIGH SENSITIVITY)
Troponin I (High Sensitivity): 2129 ng/L (ref <18)
Troponin I (High Sensitivity): 1997 ng/L (ref <18)
Troponin I (High Sensitivity): 1842 ng/L (ref <18)

## 2021-04-18 LAB — LACTIC ACID, PLASMA: Lactic Acid, Venous: 0.9 mmol/L (ref 0.5–1.9)

## 2021-04-18 LAB — HIV ANTIBODY (ROUTINE TESTING W REFLEX): HIV Screen 4th Generation wRfx: NONREACTIVE

## 2021-04-18 LAB — PROTIME-INR
INR: 1.1 (ref 0.8–1.2)
Prothrombin Time: 14.2 seconds (ref 11.4–15.2)

## 2021-04-18 LAB — HEPARIN LEVEL (UNFRACTIONATED): Heparin Unfractionated: 0.15 IU/mL — ABNORMAL LOW (ref 0.30–0.70)

## 2021-04-18 LAB — APTT: aPTT: 24 seconds (ref 24–36)

## 2021-04-18 MED ORDER — GABAPENTIN 400 MG PO CAPS
1200.0000 mg | ORAL_CAPSULE | Freq: Every day | ORAL | Status: DC
  Administered 2021-04-18 – 2021-04-20 (×3): 1200 mg via ORAL
  Filled 2021-04-18 (×2): qty 4
  Filled 2021-04-18: qty 3

## 2021-04-18 MED ORDER — SILDENAFIL CITRATE 20 MG PO TABS
20.0000 mg | ORAL_TABLET | Freq: Three times a day (TID) | ORAL | Status: DC
  Administered 2021-04-18 (×2): 20 mg via ORAL
  Filled 2021-04-18 (×3): qty 1

## 2021-04-18 MED ORDER — DORZOLAMIDE HCL-TIMOLOL MAL 2-0.5 % OP SOLN
1.0000 [drp] | Freq: Two times a day (BID) | OPHTHALMIC | Status: DC
  Administered 2021-04-18 – 2021-04-30 (×23): 1 [drp] via OPHTHALMIC
  Filled 2021-04-18 (×2): qty 10

## 2021-04-18 MED ORDER — ATORVASTATIN CALCIUM 80 MG PO TABS
80.0000 mg | ORAL_TABLET | Freq: Every day | ORAL | Status: DC
  Administered 2021-04-18 – 2021-04-29 (×11): 80 mg via ORAL
  Filled 2021-04-18 (×2): qty 1
  Filled 2021-04-18: qty 4
  Filled 2021-04-18 (×2): qty 1
  Filled 2021-04-18: qty 4
  Filled 2021-04-18 (×5): qty 1

## 2021-04-18 MED ORDER — ISOSORBIDE MONONITRATE ER 60 MG PO TB24
60.0000 mg | ORAL_TABLET | Freq: Every day | ORAL | Status: DC
  Administered 2021-04-19: 60 mg via ORAL
  Filled 2021-04-18: qty 1

## 2021-04-18 MED ORDER — INSULIN GLARGINE-YFGN 100 UNIT/ML ~~LOC~~ SOLN
5.0000 [IU] | Freq: Every day | SUBCUTANEOUS | Status: DC
  Filled 2021-04-18 (×2): qty 0.05

## 2021-04-18 MED ORDER — TAMSULOSIN HCL 0.4 MG PO CAPS
0.4000 mg | ORAL_CAPSULE | Freq: Every day | ORAL | Status: DC
  Administered 2021-04-18 – 2021-04-29 (×11): 0.4 mg via ORAL
  Filled 2021-04-18 (×11): qty 1

## 2021-04-18 MED ORDER — MONTELUKAST SODIUM 10 MG PO TABS
10.0000 mg | ORAL_TABLET | Freq: Every day | ORAL | Status: DC
  Administered 2021-04-18 – 2021-04-30 (×13): 10 mg via ORAL
  Filled 2021-04-18 (×13): qty 1

## 2021-04-18 MED ORDER — CLOPIDOGREL BISULFATE 75 MG PO TABS
75.0000 mg | ORAL_TABLET | Freq: Every day | ORAL | Status: DC
  Administered 2021-04-18 – 2021-04-30 (×13): 75 mg via ORAL
  Filled 2021-04-18 (×13): qty 1

## 2021-04-18 MED ORDER — LEVOTHYROXINE SODIUM 50 MCG PO TABS
50.0000 ug | ORAL_TABLET | ORAL | Status: DC
  Administered 2021-04-18 – 2021-04-30 (×12): 50 ug via ORAL
  Filled 2021-04-18 (×12): qty 1

## 2021-04-18 MED ORDER — SIMETHICONE 40 MG/0.6ML PO SUSP
80.0000 mg | Freq: Once | ORAL | Status: AC
  Administered 2021-04-18: 80 mg via ORAL
  Filled 2021-04-18 (×2): qty 1.2

## 2021-04-18 MED ORDER — CALCITRIOL 0.25 MCG PO CAPS
0.2500 ug | ORAL_CAPSULE | Freq: Every day | ORAL | Status: DC
  Administered 2021-04-18 – 2021-04-30 (×13): 0.25 ug via ORAL
  Filled 2021-04-18 (×15): qty 1

## 2021-04-18 MED ORDER — EZETIMIBE 10 MG PO TABS
10.0000 mg | ORAL_TABLET | Freq: Every day | ORAL | Status: DC
  Administered 2021-04-18 – 2021-04-30 (×13): 10 mg via ORAL
  Filled 2021-04-18 (×14): qty 1

## 2021-04-18 MED ORDER — ASPIRIN 81 MG PO CHEW
324.0000 mg | CHEWABLE_TABLET | Freq: Once | ORAL | Status: AC
  Administered 2021-04-18: 324 mg via ORAL
  Filled 2021-04-18: qty 4

## 2021-04-18 MED ORDER — FLUTICASONE PROPIONATE 50 MCG/ACT NA SUSP
2.0000 | Freq: Every day | NASAL | Status: DC | PRN
  Filled 2021-04-18: qty 16

## 2021-04-18 MED ORDER — POTASSIUM CHLORIDE CRYS ER 20 MEQ PO TBCR
20.0000 meq | EXTENDED_RELEASE_TABLET | Freq: Every day | ORAL | Status: DC
  Administered 2021-04-18 – 2021-04-22 (×5): 20 meq via ORAL
  Filled 2021-04-18: qty 2
  Filled 2021-04-18 (×6): qty 1

## 2021-04-18 MED ORDER — HEPARIN BOLUS VIA INFUSION
4000.0000 [IU] | Freq: Once | INTRAVENOUS | Status: AC
  Administered 2021-04-18: 4000 [IU] via INTRAVENOUS
  Filled 2021-04-18: qty 4000

## 2021-04-18 MED ORDER — HYDRALAZINE HCL 20 MG/ML IJ SOLN
10.0000 mg | Freq: Four times a day (QID) | INTRAMUSCULAR | Status: DC | PRN
  Administered 2021-04-19: 10 mg via INTRAVENOUS
  Filled 2021-04-18: qty 1

## 2021-04-18 MED ORDER — HEPARIN BOLUS VIA INFUSION
2700.0000 [IU] | Freq: Once | INTRAVENOUS | Status: AC
  Administered 2021-04-18: 2700 [IU] via INTRAVENOUS
  Filled 2021-04-18: qty 2700

## 2021-04-18 MED ORDER — ISOSORBIDE MONONITRATE ER 60 MG PO TB24
30.0000 mg | ORAL_TABLET | Freq: Every day | ORAL | Status: DC
  Administered 2021-04-18: 30 mg via ORAL
  Filled 2021-04-18: qty 1

## 2021-04-18 MED ORDER — HEPARIN (PORCINE) 25000 UT/250ML-% IV SOLN
1350.0000 [IU]/h | INTRAVENOUS | Status: DC
  Administered 2021-04-18: 1550 [IU]/h via INTRAVENOUS
  Administered 2021-04-18: 1250 [IU]/h via INTRAVENOUS
  Administered 2021-04-19 – 2021-04-20 (×3): 1550 [IU]/h via INTRAVENOUS
  Administered 2021-04-21 – 2021-04-22 (×2): 1350 [IU]/h via INTRAVENOUS
  Filled 2021-04-18 (×7): qty 250

## 2021-04-18 MED ORDER — LEVOTHYROXINE SODIUM 25 MCG PO TABS
25.0000 ug | ORAL_TABLET | ORAL | Status: DC
  Administered 2021-04-24: 25 ug via ORAL
  Filled 2021-04-18 (×2): qty 1

## 2021-04-18 MED ORDER — PERFLUTREN LIPID MICROSPHERE
1.0000 mL | INTRAVENOUS | Status: AC | PRN
  Administered 2021-04-18: 4 mL via INTRAVENOUS
  Filled 2021-04-18: qty 10

## 2021-04-18 MED ORDER — GABAPENTIN 400 MG PO CAPS
800.0000 mg | ORAL_CAPSULE | Freq: Every day | ORAL | Status: DC
  Administered 2021-04-18 – 2021-04-21 (×4): 800 mg via ORAL
  Filled 2021-04-18 (×2): qty 8
  Filled 2021-04-18 (×2): qty 2

## 2021-04-18 MED ORDER — CARVEDILOL 12.5 MG PO TABS
12.5000 mg | ORAL_TABLET | Freq: Two times a day (BID) | ORAL | Status: DC
  Administered 2021-04-18 – 2021-04-21 (×7): 12.5 mg via ORAL
  Filled 2021-04-18: qty 2
  Filled 2021-04-18: qty 1
  Filled 2021-04-18: qty 2
  Filled 2021-04-18 (×3): qty 1
  Filled 2021-04-18: qty 2

## 2021-04-18 NOTE — ED Notes (Signed)
Pt placed on male purewick at this time

## 2021-04-18 NOTE — Progress Notes (Signed)
ANTICOAGULATION CONSULT NOTE  Pharmacy Consult for Heparin Indication: chest pain/ACS  Allergies  Allergen Reactions   Calcium Channel Blockers     Would avoid if possible due to h/o peripheral edema    Patient Measurements: Weight: 120.7 kg (266 lb) Heparin Dosing Weight: 90 kg   Vital Signs: Temp: 98.5 F (36.9 C) (10/17 1240) Temp Source: Oral (10/17 1240) BP: 161/63 (10/17 1500) Pulse Rate: 80 (10/17 1500)  Labs: Recent Labs    04/17/21 2101 04/18/21 0505 04/18/21 0623 04/18/21 0658 04/18/21 1240 04/18/21 1441  HGB 9.9*  --   --   --   --   --   HCT 31.4*  --   --   --   --   --   PLT 153  --   --   --   --   --   APTT  --   --  24  --   --   --   LABPROT  --   --  14.2  --   --   --   INR  --   --  1.1  --   --   --   HEPARINUNFRC  --   --   --   --   --  0.15*  CREATININE 2.66* 2.74*  --   --   --   --   TROPONINIHS 74* 2,129*  --  1,997* 1,842*  --      Estimated Creatinine Clearance: 28.9 mL/min (A) (by C-G formula based on SCr of 2.74 mg/dL (H)).   Medical History: Past Medical History:  Diagnosis Date   Adrenal gland anomaly    Arthritis    CAD (coronary artery disease)    a. 04/2006 MI and PCI/stenting to mLCx & mRCA; b. 07/2009 Cath: patent LCX/RCA stents;  c. 12/2016 NSTEMI/Cath: LM 30, LAD 30p, 69m, 30d, D1/2/3 min irregs, LCX 164m ISR, OM1 min irregs, RCA 20p, 10 ISR, 49m, 50d, RPDA min irregs, RPAV 40-->Med Rx; d. 12/2017 MV: fixed lateral wall scar, mild anterior/anterior septal ischemia. EF 30-44%.   CHF (congestive heart failure) (HCC)    CKD (chronic kidney disease), stage III (HCC)    Colon polyps    DM2 (diabetes mellitus, type 2) (HCC)    insulin requiring   Dyslipidemia    GERD (gastroesophageal reflux disease)    History of echocardiogram    a. TTE 12/2016: EF 50-55%, mod concentric LVH, images inadequate for wall motion assessment, not technically sufficienct to allow for LV dias fxn, calcified mitral annulus    HTN (hypertension)     Kidney stones    Left Renal artery stenosis ([place])    a. 07/2013 s/p PTA/stenting ([place]); b. 07/2017 Renal Duplex: No significant RAS.   Myocardial infarction (HCC)    OSA (obstructive sleep apnea)    Pulmonary fibrosis ([place])    a. 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   Recurrent UTI    Rotator cuff disorder    right   Skin cancer    head   Thyroid disorder    Venous insufficiency of both lower extremities    a. s/p laser treatment.    Medications:  (Not in a hospital admission)  Assessment: 75 year old male w/ PMH of moderate persistent asthma, interstitial lung disease, pulmonary hypertension, OSA with chronic respiratory failure on home O2 at 4 L, diastolic heart failure, CAD, DM, CKD IV who was admitted with ACS/NSTEMI. Pharmacy was consulted for heparin  dosing.  Baseline CBC, PT/INR, aPTT was within normal limits   No prior anticoag noted  Date/time HL Comment, interpretation 10/17 1441 0.15 Subthera, at 1250 units/hr  Goal of Therapy:  Heparin level 0.3-0.7 units/ml Monitor platelets by anticoagulation protocol: Yes   Plan:  Heparin subtherapeutic, will give 2700 unit bolus and increase infusion from 1250 to 1550 units/hr Will recheck HL in 8 hours following rate change  Daily CBC while on heparin ggt  Narda Rutherford, PharmD Pharmacy Resident  04/18/2021 4:32 PM

## 2021-04-18 NOTE — ED Notes (Signed)
Pt taken off bed pan, peri care performed. Pt repositioned for comfort. Skin tear to L elbow covered with gauze, nonadherent dressing & kerlex. No additional needs verbalized at this time. Lights dimmed for pt comfort. Bed low & locked; call light & personal items within reach.

## 2021-04-18 NOTE — Progress Notes (Signed)
Placed bipap on standby, no distress noted. Patient is tolerating 4 liters of nasal cannula at this time.

## 2021-04-18 NOTE — ED Notes (Signed)
Pt placed on bed pan; given call light to call once he is finished.

## 2021-04-18 NOTE — Progress Notes (Signed)
*  PRELIMINARY RESULTS* Echocardiogram 2D Echocardiogram has been performed.  Stephen [name] 04/18/2021, 10:24 AM

## 2021-04-18 NOTE — Consult Note (Signed)
Cardiology Consultation:   Patient ID: Stephen [name] MRN: [id number]; DOB: July 24, 1945  Admit date: 04/17/2021 Date of Consult: 04/18/2021  PCP:  [name], MD   [facility]  Cardiologist:  Julien Nordmann, MD  Advanced Practice Provider:  No care team member to display Electrophysiologist:  None [phone]    Patient Profile:   Stephen [name] is a 75 y.o. male with a hx of  pulmonary hypertension, chronic diastolic CHF, CAD, pulmonary fibrosis s/p wedge resection, obesity, COPD on home oxygen, OSA on CPAP, DM2, CKD, renal artery stenosis, and who is being seen today for the evaluation of chest pain and elevated HS Tn at the request of Dr. Marylu Lund.  History of Present Illness:   Stephen [name] is a 75 year old male with PMH as above.  He has a history of CKD and renal artery stenosis s/p L renal artery stent in early 2020 for 80+ percent stenosis.  In the past, medical management / intervention of his CAD has been limited by renal function.  Most recently, he underwent 08/2020 [facility] performed for progressive dyspnea and chest tightness with dz unchanged from previous cath and including complete occlusion of LCx and m-d RCA / p-mLAD dz.  Recommendation was for medical management. It was noted that if right heart catheterization needed in the future, access would need changed.  It was noted neck would not be an appropriate choice, given the thickness of his neck.  Aggressive BP control recommended in the future.  It was thought that this uncontrolled BP could be contributing to his shortness of breath, as well as his morbid obesity.  12/2020 RHC with mild to moderately elevated filling pressures, mild pulmonary hypertension, high Zio, and recommendation to increase torsemide 40 mg daily.  He was last seen in the office 12/13/2020 for follow-up s/p RHC with improvement noted in his breathing status. He had held torsemide 40mg  daily after his cath due to nausea and  dizziness, as well as his concern regarding his renal function. BMET obtained with diuresis restarted. Cardiac rehab referral recommended to assist with increasing activity.    Most recent visit to the ED was earlier in October on 04/05/21 for chest pain and SOB but left due to wait time.  He was using 6 L nasal cannula oxygen at home.  He also reported some congestion.  Initial vitals showed BP 165/87, HR 57 bpm.  BNP 224.1, hemoglobin 11.6, hematocrit 35.7, high-sensitivity troponin 92  107.  Chest x-ray with patchy opacity in the peripheral right midlung, concerning for pneumonia.  CTA showed stable findings of idiopathic pulmonary fibrosis with superimposed diffuse inflammatory process, concerning for atypical infectious process.  Also noted with stable bilateral pleural effusions, bilateral hilar and metastatic lymphadenopathy, stable low-attenuation left adrenal mass which could represent an adrenal adenoma, and aortic atherosclerosis.  He left due to wait time and before any medication changes or treatment provided.  He presents then to [facility] ED 04/17/21 with similar symptoms as above and including report of ongoing episodes of chest burning and progressive dyspnea/SOB. He reports feeling progressive SOB and intermittent chest discomfort over the last month, worsening yesterday afternoon, and resulting in his presentation to the ED. He reports that his chest "felt on fire.: he also notes LEE but not as bad as in the past. He notes orthopnea. His last episode of chest tightness was this AM per his wife's report.  He reported increasing his home oxygen without relief. In the ED, initial vitals significant  for BP 138/98, HR 52bpm, SPO2 93%. Labs showed Hgb 9.9, HCT 31.4, glucose 216, Cr 2.66, BUN 36, AST 22, ALT 17, potassium 4.0, lactic acid 1.2 albumin 2.9, BNP 250.6, HS Tn 74.  Respiratory panel negative.  EKG SR without acute ST/T changes and prolonged QTc. CXR with persistent airspace opacities and  unchanged from chest x-ray earlier in October as above.      Past Medical History:  Diagnosis Date   Adrenal gland anomaly    Arthritis    CAD (coronary artery disease)    a. 04/2006 MI and PCI/stenting to mLCx & mRCA; b. 07/2009 Cath: patent LCX/RCA stents;  c. 12/2016 NSTEMI/Cath: LM 30, LAD 30p, 71m, 30d, D1/2/3 min irregs, LCX 154m ISR, OM1 min irregs, RCA 20p, 10 ISR, 85m, 50d, RPDA min irregs, RPAV 40-->Med Rx; d. 12/2017 MV: fixed lateral wall scar, mild anterior/anterior septal ischemia. EF 30-44%.   CHF (congestive heart failure) (HCC)    CKD (chronic kidney disease), stage III (HCC)    Colon polyps    DM2 (diabetes mellitus, type 2) (HCC)    insulin requiring   Dyslipidemia    GERD (gastroesophageal reflux disease)    History of echocardiogram    a. TTE 12/2016: EF 50-55%, mod concentric LVH, images inadequate for wall motion assessment, not technically sufficienct to allow for LV dias fxn, calcified mitral annulus    HTN (hypertension)    Kidney stones    Left Renal artery stenosis (HCC)    a. 07/2013 s/p PTA/stenting ([facility]); b. 07/2017 Renal Duplex: No significant RAS.   Myocardial infarction (HCC)    OSA (obstructive sleep apnea)    Pulmonary fibrosis (HCC)    a. 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   Recurrent UTI    Rotator cuff disorder    right   Skin cancer    head   Thyroid disorder    Venous insufficiency of both lower extremities    a. s/p laser treatment.    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   CARPAL TUNNEL RELEASE     bilateral   CATARACT EXTRACTION W/PHACO Right 07/08/2018   Procedure: CATARACT EXTRACTION PHACO AND INTRAOCULAR LENS PLACEMENT (IOC) RIGHT DIABETIC;  Surgeon: Nevada Crane, MD;  Location: [facility];  Service: Ophthalmology;  Laterality: Right;  Diabetic - insulin sleep apnea   CATARACT EXTRACTION W/PHACO Left 08/05/2018    Procedure: CATARACT EXTRACTION PHACO AND INTRAOCULAR LENS PLACEMENT (IOC) LEFT DIABETIC;  Surgeon: Nevada Crane, MD;  Location: [facility];  Service: Ophthalmology;  Laterality: Left;  DIABETIC   CORONARY ANGIOGRAPHY N/A 08/19/2020   Procedure: CORONARY ANGIOGRAPHY;  Surgeon: Antonieta Iba, MD;  Location: [facility] INVAS[facility] CV [facility];  Service: Cardiovascular;  Laterality: N/A;   CORONARY ANGIOPLASTY WITH STENT PLACEMENT  2007   x 2   KNEE SURGERY     right   LEFT HEART CATH AND CORONARY ANGIOGRAPHY N/A 01/11/2017   Procedure: Left Heart Cath and Coronary Angiography;  Surgeon: Iran Ouch, MD;  Location: [facility] INVASIVE CV LAB;  Service: Cardiovascular;  Laterality: N/A;   LUNG BIOPSY     RENAL ARTERY STENT  2015   L   RIGHT HEART CATH Right 12/06/2020   Procedure: RIGHT HEART CATH;  Surgeon: Iran Ouch, MD;  Location: [facility] INVASIVE CV LAB;  Service: Cardiovascular;  Laterality: Right;   SKIN CANCER EXCISION  back of head     Home Medications:  Prior to Admission medications   Medication Sig Start Date End Date Taking? Authorizing Provider  acetaminophen (TYLENOL) 500 MG tablet Take 1,000 mg by mouth every 6 (six) hours as needed.   Yes [provider]  albuterol (VENTOLIN HFA) 108 (90 Base) MCG/ACT inhaler INHALE 2 PUFFS INTO THE LUNGS EVERY 6 HOURS AS NEEDED FOR WHEEZING OR SHORTNESS OF BREATH. DISPENSE 3 INHALERS 11/01/20  Yes [name], MD  aspirin EC 81 MG tablet Take 1 tablet (81 mg total) by mouth daily. Swallow whole. 11/25/20  Yes [name], MD  atorvastatin (LIPITOR) 80 MG tablet TAKE 1 TABLET AT BEDTIME 01/12/21  Yes [name], MD  calcitRIOL (ROCALTROL) 0.25 MCG capsule Take 0.25 mcg by mouth daily.   Yes [provider]  carvedilol (COREG) 12.5 MG tablet TAKE 1 TABLET TWO TIMES DAILY WITH MEALS 01/12/21  Yes [name], MD  cloNIDine (CATAPRES) 0.3 MG tablet TAKE 1 TABLET THREE TIMES DAILY 12/07/20  Yes Antonieta Iba, MD  clopidogrel (PLAVIX) 75 MG tablet TAKE 1 TABLET EVERY DAY 09/29/20  Yes Alver Sorrow, NP  dorzolamide-timolol (COSOPT) 22.3-6.8 MG/ML ophthalmic solution Place 1 drop into both eyes in the morning and at bedtime. 12/05/19  Yes [provider]  ezetimibe (ZETIA) 10 MG tablet TAKE 1 TABLET EVERY DAY 09/29/20  Yes Gollan, Tollie Pizza, MD  fluticasone (FLONASE) 50 MCG/ACT nasal spray Place 2 sprays into both nostrils daily. 11/25/20  Yes [name], MD  Fluticasone-Umeclidin-Vilant (TRELEGY ELLIPTA) 200-62.5-25 MCG/INH AEPB Inhale 1 puff into the lungs daily. 04/05/21  Yes Salena Saner, MD  furosemide (LASIX) 80 MG tablet Take 160 mg by mouth daily.   Yes [provider]  gabapentin (NEURONTIN) 400 MG capsule TAKE 2 CAPSULES IN THE MORNING  AND TAKE 3 CAPSULES EVERY NIGHT 03/25/21  Yes [name], MD  hydrALAZINE (APRESOLINE) 50 MG tablet Take 1 tablet (50 mg total) by mouth 3 (three) times daily. 12/13/20 04/18/21 Yes Jeanett Antonopoulos D, PA-C  insulin NPH-regular Human (70-30) 100 UNIT/ML injection Inject 40-60 Units into the skin as directed. Take 60 units in the morning and 40 units in the evening   Yes [provider]  isosorbide mononitrate (IMDUR) 60 MG 24 hr tablet TAKE 1 AND 1/2 TABLETS TWICE DAILY 12/07/20  Yes Antonieta Iba, MD  levothyroxine (SYNTHROID) 25 MCG tablet take 2 of the 25 mcg tablets Monday through Saturday with 1 tablet on Sunday.  13 pills/week. 02/23/21  Yes [name], MD  montelukast (SINGULAIR) 10 MG tablet Take 1 tablet by mouth once daily 04/05/21  Yes Salena Saner, MD  nitroGLYCERIN (NITROSTAT) 0.4 MG SL tablet Place 1 tablet (0.4 mg total) under the tongue every 5 (five) minutes as needed. May repeat x3 03/27/19  Yes [name], MD  omeprazole (PRILOSEC) 20 MG capsule Take 20 mg by mouth daily before breakfast.   Yes [provider]  potassium chloride (KLOR-CON) 10 MEQ tablet Take 2 tablets  (20 meq) by mouth once daily 12/14/20  Yes Abijah Roussel D, PA-C  primidone (MYSOLINE) 50 MG tablet Take 0.5 tablets (25 mg total) by mouth in the morning. For tremor 02/23/21  Yes [name], MD  sildenafil (REVATIO) 20 MG tablet Take 1 tablet (20 mg total) by mouth 3 (three) times daily. 11/11/20  Yes Antonieta Iba, MD  tamsulosin (FLOMAX) 0.4 MG CAPS capsule TAKE  1 CAPSULE AT BEDTIME 01/12/21  Yes [name], MD  torsemide (DEMADEX) 20 MG tablet Take 2 tablets (40 mg) by mouth once daily 12/14/20  Yes Kaelani Kendrick D, PA-C  ACCU-CHEK AVIVA PLUS test strip USE AS INSTRUCTED TO CHECK BLOOD SUGAR 3 TIMES DAILY OR AS NEEDED 03/16/21   [name], MD  ACCU-CHEK SOFTCLIX LANCETS lancets Use as instructed to check blood sugar three times daily and as needed.  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 05/16/17   [name], MD  Alcohol Swabs (B-D SINGLE USE SWABS REGULAR) PADS USE TO CLEANS AREA PRIOR TO CHECKING BLOOD SUGAR THREE TIMES DAILY AND AS NEEDED 09/29/20   [name], MD  Blood Glucose Monitoring Suppl (ACCU-CHEK AVIVA PLUS) w/Device KIT Use to check blood sugar three times daily and as needed.  Diagnosis: E11.22  Insulin-dependent 07/01/19   [name], MD  diphenhydrAMINE (BENADRYL) 50 MG tablet Take 50 mg by mouth at bedtime as needed for allergies. Patient not taking: Reported on 04/18/2021    [provider]  DROPLET INSULIN SYRINGE 30G X 5/16" 1 ML MISC USE AS DIRECTED TO INJECT INSULIN 12/02/20   [name], MD  HYDROcodone-acetaminophen (NORCO/VICODIN) 5-325 MG tablet Take 1 tablet by mouth every 6 (six) hours as needed for moderate pain. Patient not taking: Reported on 04/18/2021 07/26/20   [name], MD  Sennosides (EX-LAX) 15 MG CHEW Chew 15 mg by mouth 2 (two) times daily as needed (constipation). Patient not taking: Reported on 04/18/2021    [provider]    Inpatient Medications: Scheduled Meds:  aspirin EC  81  mg Oral Daily   atorvastatin  80 mg Oral QHS   calcitRIOL  0.25 mcg Oral Daily   carvedilol  3.125 mg Oral BID WC   clopidogrel  75 mg Oral Daily   dorzolamide-timolol  1 drop Both Eyes BID   ezetimibe  10 mg Oral Daily   furosemide  40 mg Intravenous BID   gabapentin  1,200 mg Oral QHS   gabapentin  800 mg Oral Daily   hydrALAZINE  50 mg Oral TID   insulin aspart  0-15 Units Subcutaneous TID WC   insulin aspart  0-5 Units Subcutaneous QHS   ipratropium-albuterol  3 mL Nebulization Q6H   isosorbide mononitrate  30 mg Oral Daily   [START ON 04/24/2021] levothyroxine  25 mcg Oral Weekly   levothyroxine  50 mcg Oral Once per day on Mon Tue Wed Thu Fri Sat   methylPREDNISolone (SOLU-MEDROL) injection  120 mg Intravenous Q24H   Followed by   Melene Muller ON 04/19/2021] predniSONE  40 mg Oral Q breakfast   montelukast  10 mg Oral Daily   potassium chloride  20 mEq Oral Daily   sildenafil  20 mg Oral TID   sodium chloride flush  3 mL Intravenous Q12H   tamsulosin  0.4 mg Oral QHS   Continuous Infusions:  sodium chloride     heparin 1,250 Units/hr (04/18/21 0624)   PRN Meds: sodium chloride, acetaminophen, albuterol, fluticasone, guaiFENesin-dextromethorphan, HYDROcodone-acetaminophen, nitroGLYCERIN, ondansetron (ZOFRAN) IV, sodium chloride flush  Allergies:    Allergies  Allergen Reactions   Calcium Channel Blockers     Would avoid if possible due to h/o peripheral edema    Social History:   Social History   Socioeconomic History   Marital status: Married    Spouse name: Not on file   Number of children: 2   Years of education: Not on file  Highest education level: Not on file  Occupational History   Occupation: retired from service station work, 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    Comment: significant metal dust exposure, significant asbestos exposure    Employer: DISABLED  Tobacco Use   Smoking status: Never   Smokeless tobacco: Never  Vaping Use   Vaping Use: Never used   Substance and Sexual Activity   Alcohol use: No    Alcohol/week: 0.0 standard drinks   Drug use: No   Sexual activity: Yes  Other Topics Concern   Not on file  Social History Narrative   Enjoys fishing   Public librarian   Social Determinants of Health   Financial Resource Strain: Low Risk    Difficulty of Paying Living Expenses: Not hard at all  Food Insecurity: No Food Insecurity   Worried About Programme researcher, broadcasting/film/video in the Last Year: Never true   Barista in the Last Year: Never true  Transportation Needs: No Transportation Needs   Lack of Transportation (Medical): No   Lack of Transportation (Non-Medical): No  Physical Activity: Inactive   Days of Exercise per Week: 0 days   Minutes of Exercise per Session: 0 min  Stress: No Stress Concern Present   Feeling of Stress : Not at all  Social Connections: Not on file  Intimate Partner Violence: Not At Risk   Fear of Current or Ex-Partner: No   Emotionally Abused: No   Physically Abused: No   Sexually Abused: No    Family History:    Family History  Problem Relation Age of Onset   Heart attack Father    Heart attack Mother    Heart attack Brother    COPD Sister    Arthritis Maternal Grandmother    Breast cancer Maternal Aunt    Diabetes Maternal Aunt    Hypertension Other    Hypertension Maternal Aunt    Prostate cancer Neg Hx    Kidney cancer Neg Hx    Bladder Cancer Neg Hx    Colon cancer Neg Hx      ROS:  Please see the history of present illness.  Review of Systems  Respiratory:  Positive for shortness of breath.   Cardiovascular:  Positive for chest pain, orthopnea and leg swelling.  All other systems reviewed and are negative.  All other ROS reviewed and negative.     Physical Exam/Data:   Vitals:   04/18/21 0530 04/18/21 0604 04/18/21 0805 04/18/21 1102  BP: (!) 184/79  (!) 193/73 (!) 184/71  Pulse: 81  78 83  Resp: 18  17 16   SpO2: 96%  95% 94%  Weight:  120.7 kg      Intake/Output  Summary (Last 24 hours) at 04/18/2021 1110 Last data filed at 04/18/2021 0321 Gross per 24 hour  Intake --  Output 700 ml  Net -700 ml   Last 3 Weights 04/18/2021 03/08/2021 02/21/2021  Weight (lbs) 266 lb 268 lb 265 lb  Weight (kg) 120.657 kg 121.564 kg 120.203 kg  Some encounter information is confidential and restricted. Go to Review Flowsheets activity to see all data.     Body mass index is 44.26 kg/m.  General:  Obese male, NAD.  Joined by his wife. HEENT: normal Lymph: no adenopathy Neck: JVD difficult to assess due to body habitus Endocrine:  No thryomegaly Vascular: No carotid bruits; FA pulses 2+ bilaterally without bruits  Cardiac:  normal S1, S2; distant heart sounds, RRR; no murmur Lungs: Distant breath sounds,  coarse breath sounds bilaterally and slightly reduced at the bases Abd: Distended, firm Ext: Bilateral 1-2+ pitting edema to the level of the thigh Musculoskeletal:  No deformities Skin: warm and dry  Neuro: Slight tremor noted but otherwise no focal abnormalities  Psych:  Normal affect   EKG:  The EKG was personally reviewed and demonstrates: NSR, 78 bpm first-degree AV block, LBBB (QRS widening seen on previous tracings), LVH with repolarization abnormalities, poor R wave progression, prolonged QTC at 514 ms Telemetry:  Telemetry was personally reviewed and demonstrates:  NSR, rates 60-90s  Relevant CV Studies: RHC 12/06/20 Successful right heart catheterization via the right brachial vein. Right heart catheterization showed mildly to moderately elevated filling pressures, mild pulmonary hypertension and high cardiac output. RA: 13 mmHg PCW: 20 mmHg with a V wave of 27 mmHg PA: 44/ 18 with a mean of 30 mmHg.  Pulmonary vascular resistance is only 1.14 Woods unit RV: 48 /12 mmHg PA sat is 97% with cardiac output of 8.79 and cardiac index of 3.98. Recommendations: Recommend increasing torsemide to 40 mg once daily.  Check basic metabolic profile in 1 week.    Coronary angiography 08/19/20 Coronary Diagrams     Diagnostic Dominance: Right     Coronary angiography:  Coronary dominance: Right  Left mainstem:   Large vessel that bifurcates into the LAD and left circumflex  Left anterior descending (LAD):   Large vessel that extends to the apical region, diagonal branch 2 of moderate size, moderate proximal and mid LAD disease.  Moderate ostial diagonal disease D1-D2  Left circumflex (LCx):  Large vessel with OM branch 2, prior stent mid vessel, occluded at this location, collaterals from left to left, right to left  Right coronary artery (RCA):  Right dominant vessel with PL and PDA, moderate to severe mid vessel disease 70%, 50% distal disease  Left ventriculography: Crossed for pressures, LV gram not performed  Final Conclusions:   Challenging access  given morbid obesity, weight 270 pounds Severe left circumflex disease occluded mid vessel, known from prior catheterization, collaterals Moderate to severe mid to distal RCA disease, unchanged from prior catheterization Moderate proximal and mid LAD disease Difficult groin access given morbid obesity, arterial accessed easily, Ultrasound used to visualize the vein, not well visualized apart from distal vessel but was too proximal to the artery to engage Grossly vein did not appear markedly dilated.  Recommendations:  Medical management recommended If right heart catheterization felt needed, would need to change access.  Neck not a appropriate choice given thick neck, obesity --Markedly elevated blood pressure during the case Recommend aggressive blood pressure control, this is likely contributing to his shortness of breath -Also with morbid obesity, weight continues to trend upwards, recommend strict low carbohydrate diet Goal weight 200   LM lesion is 30% stenosed. Prox LAD lesion is 40% stenosed. Mid LAD lesion is 50% stenosed. Dist LAD lesion is 30% stenosed. Mid Cx lesion is  100% stenosed. Prox RCA lesion is 20% stenosed. Dist RCA lesion is 50% stenosed. Mid RCA-1 lesion is 10% stenosed. Mid RCA-2 lesion is 70% stenosed. RPAV lesion is 40% stenosed. 2nd Diag lesion is 50% stenosed. 1st Diag lesion is 45% stenosed.         Echo 08/06/20  1. Left ventricular ejection fraction, by estimation, is 55 to 60%. The  left ventricle has normal function. The left ventricle has no regional  wall motion abnormalities. Left ventricular diastolic parameters are  consistent with Grade I diastolic  dysfunction (impaired  relaxation).   2. Right ventricular systolic function is normal. The right ventricular  size is normal.   3. Left atrial size was moderately dilated.   4. Challenging images    US renal artery 07/2020 Summary:  Renal:  Right: Normal size right kidney. 1-59% stenosis of the right renal         artery. Unable to visualize the Aorta and segments of the         Right Renal Artery.  Left:  Normal size of left kidney. Technically difficult due to         bowel Gas; unable to visualize the Renal Artery.   Laboratory Data:  High Sensitivity Troponin:   Recent Labs  Lab 04/05/21 1640 04/05/21 2016 04/17/21 2101 04/18/21 0505 04/18/21 0658  TROPONINIHS 92* 107* 74* 2,129* 1,997*     Chemistry Recent Labs  Lab 04/17/21 2101 04/18/21 0505  NA 141 140  K 4.0 3.8  CL 105 106  CO2 27 26  GLUCOSE 216* 283*  BUN 36* 35*  CREATININE 2.66* 2.74*  CALCIUM 8.3* 8.2*  GFRNONAA 25* 24*  ANIONGAP 9 8    Recent Labs  Lab 04/17/21 2101  PROT 6.8  ALBUMIN 2.9*  AST 22  ALT 17  ALKPHOS 84  BILITOT 0.7   Hematology Recent Labs  Lab 04/17/21 2101  WBC 6.0  RBC 3.40*  HGB 9.9*  HCT 31.4*  MCV 92.4  MCH 29.1  MCHC 31.5  RDW 15.1  PLT 153   BNP Recent Labs  Lab 04/17/21 2058  BNP 250.6*    DDimer No results for input(s): DDIMER in the last 168 hours.   Radiology/Studies:  DG Chest Portable 1 View  Result Date:  04/17/2021 CLINICAL DATA:  Shortness of breath EXAM: PORTABLE CHEST 1 VIEW COMPARISON:  04/05/2021 FINDINGS: Cardiac shadow is prominent but stable. Aortic calcifications are again seen. Lungs are well aerated bilaterally with persistent airspace opacities bilaterally stable from the prior exam. No bony abnormality is noted. IMPRESSION: No significant interval change from the prior study. Electronically Signed   By: Alcide Clever M.D.   On: 04/17/2021 21:11     Assessment and Plan:   Non-STEMI Coronary artery disease -- No current chest pain. Reports chest tightness/burning with progressive symptoms of overload as noted in the past.  He does have known history of CAD with previous 2018 LHC showing significant two-vessel CAD with an occluded stent in the mid left circumflex with collaterals from LAD and RCA, patent RCA stent with 70% stenosis distal to the stent and mixed segment and 50% stenosis in the distal segment.  Recommendation was for medical therapy at that time..  Previous intervention limited by renal function with known complete occlusion of left circumflex and nonobstructive disease as detailed above.  EKG without acute ST/T changes and history of QRS widening. HS Tn peaked at 2129, now downtrending.  Given history of CAD, cannot rule out coronary insufficiency with echo pending. Also considered is elevated HS Tn 2/2 supply demand mismatch.  Serial EKGs Continue IV heparin Continue PTA ASA, Plavix, carvedilol, atorvastatin, Zetia, and Imdur.   Increased Coreg and Imdur to PTA doses given ongoing CP and poorly controlled BP with room in HR.  Echo pending to reassess EF, WM, and rule out acute structural changes. Previous echo above with nl EF. Continue medical management. If echo with acute changes, will need to consider repeat R/LHC with consideration of renal function and once volume status optimized. Recommend BP control and  IV diuresis as below. Aggressive risk factor modification  recommended.   Acute on Chronic diastolic heart failure   Pulmonary hypertension -- Reports progressive symptoms of volume overload with shortness of breath, DOE, orthopnea, LE E.  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  Repeat echo pending as above. Recent RHC performed 12/06/20 and recommendation to increase to torsemide 40mg  daily. He remains volume up on exam today with IV diuresis started in the ED.   Continue IV diuresis and escalate as tolerated by renal function Monitor I's/O's, daily standing weights.   Daily BMET.  Maintain electrolytes at goal. Cr bumped overnight from 2.66  2.74 with baseline Cr 2.5-2.6. Trend BNP. Increased BB and Imdur to home doses.  Aggressive BP control.   As previously noted, it is unclear if he would be a candidate for advanced therapies given fibrosis and morbid obesity. Further recommendations if indicated pending echo.  Remainder as directly above.   Hypokalemia --Replete with goal 4.0. Check Mg.  HTN, goal BP less than 130/80 -- BP poorly controlled.  Of note, PTA regimen includes clonidine, Imdur, torsemide, hydralazine, and Coreg.  Increased Coreg and Imdur to PTA doses.  Continue IV diuresis.  Ideally, wean off of hydralazine and clonidine in the near future to avoid rebound hypertension and rebound tachycardia.   CKD 3 -- Followed by [facility] in the outpatient setting.  Baseline Cr 2.5 and reportedly unchanged for 2 years.  Cr bump overnight and will monitor closely. Continue to follow with [facility].   DM2 -- SSI, per IM.    Hypothyroidism -- Will check TSH. Continue treatment and monitoring per IM.  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  Reports increasing home O2 to 6L daily.   For questions or updates, please contact [facility] Please consult [email or web address] for  contact info under    Signed, Lennon Alstrom, PA-C  04/18/2021 11:10 AM

## 2021-04-18 NOTE — Progress Notes (Signed)
  Cross coverage note  Patient complained of burning chest pain improved with empiric simethicone.  Repeat troponin over 2000, up from 74.  EKG with nonspecific ST-T wave changes  NSTEMI - Aspirin 324 chewable - Heparin infusion - Cardiology consulted.  Dr. Saunders Revel will see

## 2021-04-18 NOTE — Progress Notes (Signed)
PT Cancellation Note  Patient Details Name: JOLAN UPCHURCH MRN: [id number] DOB: 09-Jun-1946   Cancelled Treatment:    Reason Eval/Treat Not Completed: Patient not medically ready;Medical issues which prohibited therapy. Order received, chart reviewed. Troponin elevated with pt reporting chest pain this morning; cardiology consult pending. Will hold PT evaluation at this time and re-attempt when pt is medically appropriate.    Patrina Levering PT, DPT 04/18/21 8:57 AM [phone]

## 2021-04-18 NOTE — Progress Notes (Signed)
ANTICOAGULATION CONSULT NOTE - Initial Consult  Pharmacy Consult for Heparin Indication: chest pain/ACS  Allergies  Allergen Reactions   Calcium Channel Blockers     Would avoid if possible due to h/o peripheral edema    Patient Measurements: Weight: 120.7 kg (266 lb) Heparin Dosing Weight: 90 kg   Vital Signs: BP: 174/68 (10/17 0430) Pulse Rate: 75 (10/17 0430)  Labs: Recent Labs    04/17/21 2101 04/18/21 0505  HGB 9.9*  --   HCT 31.4*  --   PLT 153  --   CREATININE 2.66* 2.74*  TROPONINIHS 74* 2,129*    Estimated Creatinine Clearance: 28.9 mL/min (A) (by C-G formula based on SCr of 2.74 mg/dL (H)).   Medical History: Past Medical History:  Diagnosis Date   Adrenal gland anomaly    Arthritis    CAD (coronary artery disease)    a. 04/2006 MI and PCI/stenting to mLCx & mRCA; b. 07/2009 Cath: patent LCX/RCA stents;  c. 12/2016 NSTEMI/Cath: LM 30, LAD 30p, 82m, 30d, D1/2/3 min irregs, LCX 167m ISR, OM1 min irregs, RCA 20p, 10 ISR, 75m, 50d, RPDA min irregs, RPAV 40-->Med Rx; d. 12/2017 MV: fixed lateral wall scar, mild anterior/anterior septal ischemia. EF 30-44%.   CHF (congestive heart failure) (HCC)    CKD (chronic kidney disease), stage III (HCC)    Colon polyps    DM2 (diabetes mellitus, type 2) (HCC)    insulin requiring   Dyslipidemia    GERD (gastroesophageal reflux disease)    History of echocardiogram    a. TTE 12/2016: EF 50-55%, mod concentric LVH, images inadequate for wall motion assessment, not technically sufficienct to allow for LV dias fxn, calcified mitral annulus    HTN (hypertension)    Kidney stones    Left Renal artery stenosis ([place])    a. 07/2013 s/p PTA/stenting ([facility]); b. 07/2017 Renal Duplex: No significant RAS.   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   Recurrent UTI    Rotator cuff disorder     right   Skin cancer    head   Thyroid disorder    Venous insufficiency of both lower extremities    a. s/p laser treatment.    Medications:  (Not in a hospital admission)   Assessment: Pharmacy consulted to dose heparin in this 75 year old admitted with ACS/NSTEMI.  No prior anticoag noted. CrCl = 28.9 ml/min   Goal of Therapy:  Heparin level 0.3-0.7 units/ml Monitor platelets by anticoagulation protocol: Yes   Plan:  Give 4000 units bolus x 1 Start heparin infusion at 1250 units/hr Check anti-Xa level in 8 hours and daily while on heparin Continue to monitor H&H and platelets  Ab Leaming D 04/18/2021,6:09 AM

## 2021-04-18 NOTE — Progress Notes (Signed)
OT Cancellation Note  Patient Details Name: Stephen [name] MRN: [id number] DOB: 07-Jan-1946   Cancelled Treatment:    Reason Eval/Treat Not Completed: Patient not medically ready. Consult received, chart reviewed. Pt noted with chest pain this am, troponin elevated (still trending), and cardiology consult pending. Will hold OT evaluation at this time and re-attempt at later date/time as medically appropriate for evaluation of ADL and exertional activity.   Ardeth Perfect., MPH, MS, OTR/L ascom [phone] 04/18/21, 8:33 AM

## 2021-04-18 NOTE — Progress Notes (Signed)
Inpatient Diabetes Program Recommendations  AACE/ADA: New Consensus Statement on Inpatient Glycemic Control (2015)  Target Ranges:  Prepandial:   less than 140 mg/dL      Peak postprandial:   less than 180 mg/dL (1-2 hours)      Critically ill patients:  140 - 180 mg/dL   Lab Results  Component Value Date   GLUCAP 265 (H) 04/18/2021   HGBA1C 6.7 (H) 02/21/2021    Review of Glycemic Control Results for Stephen [name], Stephen [name] (MRN [id number]) as of 04/18/2021 09:29  Ref. Range 04/18/2021 08:04  Glucose-Capillary Latest Ref Range: 70 - 99 mg/dL 265 (H)   Diabetes history: DM 2 Outpatient Diabetes medications: 70/30 60 units qam, 40 units qpm Current orders for Inpatient glycemic control:  Novolog 0-15 units tid + hs  A1c 6.7% on 8/22 Solumedrol 120 mg Q24 hours PO prednisone scheduled to start on 10/19 40 mg Daily Glucose 265 this am  Inpatient Diabetes Program Recommendations:    -  Add Semglee 15 units -  Add Novolog 4 units tid meal coverage if eating >50% of meals  Thanks,  Tama Headings RN, MSN, BC-ADM Inpatient Diabetes Coordinator Team Pager [phone] (8a-5p)

## 2021-04-18 NOTE — Progress Notes (Signed)
OT Cancellation Note  Patient Details Name: JOSHU FURUKAWA MRN: [id number] DOB: [birth year]   Cancelled Treatment:    Reason Eval/Treat Not Completed: Medical issues which prohibited therapy. Chart reviewed. Pt pending echo results to determine plan of care. Per cardiology, if echo shows acute changes, "will need to consider repeat R/LHC with consideration of renal function and once volume status optimized." BP still elevated. Will continue to hold OT evaluation and re-attempt once plan of care is better established and pt is appropriate for exertional activity.   Ardeth Perfect., MPH, MS, OTR/L ascom [phone] 04/18/21, 3:09 PM

## 2021-04-18 NOTE — ED Notes (Signed)
After neb treatment, patient continues to c/o "fire in chest" and "unable to get a good breath."  No relief in symptoms with positional changes or PRN medication.  Message sent to MD for further suggestions/orders.

## 2021-04-18 NOTE — ED Notes (Signed)
Patient given Neb treatment to help with "fire in chest."  Continues to report no pain.  Will reassess symptoms shortly

## 2021-04-18 NOTE — Progress Notes (Signed)
PROGRESS NOTE    Stephen [name]  EVO:[id number] DOB: 10/21/1945 DOA: 04/17/2021 PCP: [name], MD    Brief Narrative:  Stephen [name] is a 75 y.o. male with medical history significant for Moderate persistent asthma, interstitial lung disease, pulmonary hypertension, OSA with chronic respiratory failure on home O2 at 4 L, followed by pulmonology, as well as diastolic heart failure, CAD, DM, CKD IV, who was brought in by EMS in respiratory distress.  Reports a one week history of progressively worsening shortness of breath not improving after increasing home oxygen to 6-1/2 L from his baseline of 6L.  He has mild productive cough with yellowish-white phlegm but denies fever or chills.  He denies chest pain, nausea vomiting or abdominal pain and denies diarrhea  ED course: On arrival afebrile, BP 155/107 going as high as 204/75 with pulse in the 50s to 60s.  O2 sats 100% on 6 L but with increased work of breathing requiring transition to BiPAP Blood work troponin 74 with BNP 250.  WBC 6 with lactic acid 1.2.  Creatinine 2.66 which is about his baseline for CKD 4  Consultants:  cardiology  Procedures:   Antimicrobials:      Subjective: Sob this am. Wife at bedside. Feels   Objective: Vitals:   04/18/21 0430 04/18/21 0530 04/18/21 0604 04/18/21 0805  BP: (!) 174/68 (!) 184/79  (!) 193/73  Pulse: 75 81  78  Resp: 17 18  17   SpO2: 97% 96%  95%  Weight:   120.7 kg     Intake/Output Summary (Last 24 hours) at 04/18/2021 0843 Last data filed at 04/18/2021 0321 Gross per 24 hour  Intake --  Output 700 ml  Net -700 ml   Filed Weights   04/18/21 0604  Weight: 120.7 kg    Examination:  General exam: nad, anxious mildly  Respiratory system: decrease bs no wheezing Cardiovascular system: S1 & S2 heard, RRR. No gallop Gastrointestinal system: Abdomen is nondistended, soft and nontender. Normal bowel sounds heard. Central nervous system: Alert and  oriented. Extremities:+edema b/l Psychiatry: Mood & affect appropriate in current setting.     Data Reviewed: I have personally reviewed following labs and imaging studies  CBC: Recent Labs  Lab 04/17/21 2101  WBC 6.0  NEUTROABS 4.8  HGB 9.9*  HCT 31.4*  MCV 92.4  PLT 299   Basic Metabolic Panel: Recent Labs  Lab 04/17/21 2101 04/18/21 0505  NA 141 140  K 4.0 3.8  CL 105 106  CO2 27 26  GLUCOSE 216* 283*  BUN 36* 35*  CREATININE 2.66* 2.74*  CALCIUM 8.3* 8.2*   GFR: Estimated Creatinine Clearance: 28.9 mL/min (A) (by C-G formula based on SCr of 2.74 mg/dL (H)). Liver Function Tests: Recent Labs  Lab 04/17/21 2101  AST 22  ALT 17  ALKPHOS 84  BILITOT 0.7  PROT 6.8  ALBUMIN 2.9*   No results for input(s): LIPASE, AMYLASE in the last 168 hours. No results for input(s): AMMONIA in the last 168 hours. Coagulation Profile: Recent Labs  Lab 04/18/21 0623  INR 1.1   Cardiac Enzymes: No results for input(s): CKTOTAL, CKMB, CKMBINDEX, TROPONINI in the last 168 hours. BNP (last 3 results) No results for input(s): PROBNP in the last 8760 hours. HbA1C: No results for input(s): HGBA1C in the last 72 hours. CBG: Recent Labs  Lab 04/18/21 0045 04/18/21 0804  GLUCAP 166* 265*   Lipid Profile: No results for input(s): CHOL, HDL, LDLCALC, TRIG, CHOLHDL, LDLDIRECT in the  last 72 hours. Thyroid Function Tests: No results for input(s): TSH, T4TOTAL, FREET4, T3FREE, THYROIDAB in the last 72 hours. Anemia Panel: No results for input(s): VITAMINB12, FOLATE, FERRITIN, TIBC, IRON, RETICCTPCT in the last 72 hours. Sepsis Labs: Recent Labs  Lab 04/17/21 2101 04/18/21 0658  LATICACIDVEN 1.2 0.9    Recent Results (from the past 240 hour(s))  Resp Panel by RT-PCR (Flu A&B, Covid) Nasopharyngeal Swab     Status: None   Collection Time: 04/17/21  8:58 PM   Specimen: Nasopharyngeal Swab; Nasopharyngeal(NP) swabs in vial transport medium  Result Value Ref Range Status    SARS Coronavirus 2 by RT PCR NEGATIVE NEGATIVE Final    Comment: (NOTE) SARS-CoV-2 target nucleic acids are NOT DETECTED.  The SARS-CoV-2 RNA is generally detectable in upper respiratory specimens during the acute phase of infection. The lowest concentration of SARS-CoV-2 viral copies this assay can detect is 138 copies/mL. A negative result does not preclude SARS-Cov-2 infection and should not be used as the sole basis for treatment or other patient management decisions. A negative result may occur with  improper specimen collection/handling, submission of specimen other than nasopharyngeal swab, presence of viral mutation(s) within the areas targeted by this assay, and inadequate number of viral copies(<138 copies/mL). A negative result must be combined with clinical observations, patient history, and epidemiological information. The expected result is Negative.  Fact Sheet for Patients:  EntrepreneurPulse.com.au  Fact Sheet for Healthcare Providers:  IncredibleEmployment.be  This test is no t yet approved or cleared by the [place] FDA and  has been authorized for detection and/or diagnosis of SARS-CoV-2 by FDA under an Emergency Use Authorization (EUA). This EUA will remain  in effect (meaning this test can be used) for the duration of the COVID-19 declaration under Section 564(b)(1) of the Act, 21 U.S.C.section 360bbb-3(b)(1), unless the authorization is terminated  or revoked sooner.       Influenza A by PCR NEGATIVE NEGATIVE Final   Influenza B by PCR NEGATIVE NEGATIVE Final    Comment: (NOTE) The Xpert Xpress SARS-CoV-2/FLU/RSV plus assay is intended as an aid in the diagnosis of influenza from Nasopharyngeal swab specimens and should not be used as a sole basis for treatment. Nasal washings and aspirates are unacceptable for Xpert Xpress SARS-CoV-2/FLU/RSV testing.  Fact Sheet for  Patients: EntrepreneurPulse.com.au  Fact Sheet for Healthcare Providers: IncredibleEmployment.be  This test is not yet approved or cleared by the [place] FDA and has been authorized for detection and/or diagnosis of SARS-CoV-2 by FDA under an Emergency Use Authorization (EUA). This EUA will remain in effect (meaning this test can be used) for the duration of the COVID-19 declaration under Section 564(b)(1) of the Act, 21 U.S.C. section 360bbb-3(b)(1), unless the authorization is terminated or revoked.  Performed at [facility], [address] [place], [place] [address]          Radiology Studies: DG Chest Portable 1 View  Result Date: 04/17/2021 CLINICAL DATA:  Shortness of breath EXAM: PORTABLE CHEST 1 VIEW COMPARISON:  04/05/2021 FINDINGS: Cardiac shadow is prominent but stable. Aortic calcifications are again seen. Lungs are well aerated bilaterally with persistent airspace opacities bilaterally stable from the prior exam. No bony abnormality is noted. IMPRESSION: No significant interval change from the prior study. Electronically Signed   By: Inez Catalina M.D.   On: 04/17/2021 21:11        Scheduled Meds:  aspirin EC  81 mg Oral Daily   atorvastatin  80 mg Oral QHS  calcitRIOL  0.25 mcg Oral Daily   carvedilol  3.125 mg Oral BID WC   clopidogrel  75 mg Oral Daily   dorzolamide-timolol  1 drop Both Eyes BID   ezetimibe  10 mg Oral Daily   furosemide  40 mg Intravenous BID   gabapentin  1,200 mg Oral QHS   gabapentin  800 mg Oral Daily   hydrALAZINE  50 mg Oral TID   insulin aspart  0-15 Units Subcutaneous TID WC   insulin aspart  0-5 Units Subcutaneous QHS   ipratropium-albuterol  3 mL Nebulization Q6H   isosorbide mononitrate  30 mg Oral Daily   [START ON 04/24/2021] levothyroxine  25 mcg Oral Weekly   levothyroxine  50 mcg Oral Once per day on Mon Tue Wed Thu Fri Sat   methylPREDNISolone (SOLU-MEDROL)  injection  120 mg Intravenous Q24H   Followed by   Derrill Memo ON 04/19/2021] predniSONE  40 mg Oral Q breakfast   montelukast  10 mg Oral Daily   potassium chloride  20 mEq Oral Daily   sildenafil  20 mg Oral TID   simethicone  80 mg Oral Once   sodium chloride flush  3 mL Intravenous Q12H   tamsulosin  0.4 mg Oral QHS   Continuous Infusions:  sodium chloride     heparin 1,250 Units/hr (04/18/21 6967)    Assessment & Plan:   Active Problems:   Type 2 diabetes mellitus with renal complication (HCC)   OSA (obstructive sleep apnea)   CAD (coronary artery disease)   Essential hypertension, benign   Acute on chronic diastolic CHF (congestive heart failure) (HCC)   Pulmonary hypertension, primary (HCC)   Acute on chronic respiratory failure with hypoxemia (HCC)   CKD (chronic kidney disease) stage 4, GFR 15-29 ml/min (HCC)   ILD (interstitial lung disease) (HCC)   COPD with acute exacerbation (HCC)    NSTEMI CAD Troponins elevated Cardiology consulted input was appreciated Plan for left heart cath once more stable Coreg and Imdur increased Obtain echo BP control    Acute on chronic respiratory failure with hypoxemia  - Patient presenting in acute respiratory distress, speaking in short sentences, requiring BiPAP - Etiology related to COPD and CHF exacerbation superimposed on chronic underlying pulmonary conditions including ILD, OSA, PAH 10/17 weaned off BiPAP to nasal cannula Treat underlying issues May consider PCCM if warranted and does not improve Lasix iv    Acute on chronic diastolic CHF  Suspect hypertensive emergency -Shortness of breath, SBP in the low 200s with a BNP 259 bilateral airspace disease on chest x-ray though unchanged from 10/4 during an earlier visit to the ED 10/17- continue iv lasix Will obtain echo Continue Imdur and coreg. Hold Sildanefil as contraindicated with Imdur Bp control I/o Daily wt Continue hydralazine Hydralazine iv prn       COPD with acute exacerbation (HCC) Continue inhalers/negs Continue steroid Keep 02>92%          Pulmonary hypertension, primary (HCC)   ILD (interstitial lung disease) (HCC)   OSA - Last seen by pulmonology in August 2022 10/17 hold sildanefil May consider pccm if needed      CKD (chronic kidney disease) stage 4,  Baseline cr 2.6 Monitor closely with diuresis      Type 2 diabetes mellitus with renal complication (HCC) -Sliding scale insulin coverage  start lantus 5units qhs   DVT prophylaxis: Lovenox Code Status: Full Family Communication: Wife at bedside Disposition Plan:  Status is: Inpatient  Remains inpatient appropriate because:Inpatient  level of care appropriate due to severity of illness   Dispo: The patient is from: Home              Anticipated d/c is to: Home              Patient currently is not medically stable to d/c.              Difficult to place patient No            LOS: 1 day   Time spent: 45 min with >50% on coc    Nolberto Hanlon, MD [facility] Pager [phone]  If 7PM-7AM, please contact night-coverage 04/18/2021, 8:43 AM

## 2021-04-18 NOTE — ED Notes (Signed)
Patient reports a "burning in my chest.  It feel like it is on fire."  No reports of pain.

## 2021-04-19 ENCOUNTER — Inpatient Hospital Stay: Payer: Medicare HMO

## 2021-04-19 DIAGNOSIS — J9621 Acute and chronic respiratory failure with hypoxia: Secondary | ICD-10-CM | POA: Diagnosis not present

## 2021-04-19 DIAGNOSIS — J441 Chronic obstructive pulmonary disease with (acute) exacerbation: Secondary | ICD-10-CM | POA: Diagnosis not present

## 2021-04-19 DIAGNOSIS — N184 Chronic kidney disease, stage 4 (severe): Secondary | ICD-10-CM | POA: Diagnosis not present

## 2021-04-19 DIAGNOSIS — I5033 Acute on chronic diastolic (congestive) heart failure: Secondary | ICD-10-CM | POA: Diagnosis not present

## 2021-04-19 LAB — CBC
HCT: 39.4 % (ref 39.0–52.0)
HCT: 37.5 % — ABNORMAL LOW (ref 39.0–52.0)
Hemoglobin: 13 g/dL (ref 13.0–17.0)
Hemoglobin: 12 g/dL — ABNORMAL LOW (ref 13.0–17.0)
MCH: 29.1 pg (ref 26.0–34.0)
MCH: 28.1 pg (ref 26.0–34.0)
MCHC: 33 g/dL (ref 30.0–36.0)
MCHC: 32 g/dL (ref 30.0–36.0)
MCV: 88.1 fL (ref 80.0–100.0)
MCV: 87.8 fL (ref 80.0–100.0)
Platelets: 191 10*3/uL (ref 150–400)
Platelets: 205 10*3/uL (ref 150–400)
RBC: 4.47 MIL/uL (ref 4.22–5.81)
RBC: 4.27 MIL/uL (ref 4.22–5.81)
RDW: 15.3 % (ref 11.5–15.5)
RDW: 15.3 % (ref 11.5–15.5)
WBC: 11 10*3/uL — ABNORMAL HIGH (ref 4.0–10.5)
WBC: 10 10*3/uL (ref 4.0–10.5)
nRBC: 0 % (ref 0.0–0.2)
nRBC: 0 % (ref 0.0–0.2)

## 2021-04-19 LAB — BASIC METABOLIC PANEL
Anion gap: 11 (ref 5–15)
BUN: 47 mg/dL — ABNORMAL HIGH (ref 8–23)
CO2: 25 mmol/L (ref 22–32)
Calcium: 8.5 mg/dL — ABNORMAL LOW (ref 8.9–10.3)
Chloride: 101 mmol/L (ref 98–111)
Creatinine, Ser: 2.8 mg/dL — ABNORMAL HIGH (ref 0.61–1.24)
GFR, Estimated: 23 mL/min — ABNORMAL LOW (ref >60)
Glucose, Bld: 320 mg/dL — ABNORMAL HIGH (ref 70–99)
Potassium: 3.9 mmol/L (ref 3.5–5.1)
Sodium: 137 mmol/L (ref 135–145)

## 2021-04-19 LAB — CREATININE, SERUM
Creatinine, Ser: 2.89 mg/dL — ABNORMAL HIGH (ref 0.61–1.24)
GFR, Estimated: 22 mL/min — ABNORMAL LOW (ref >60)

## 2021-04-19 LAB — GLUCOSE, CAPILLARY: Glucose-Capillary: 211 mg/dL — ABNORMAL HIGH (ref 70–99)

## 2021-04-19 LAB — CBG MONITORING, ED
Glucose-Capillary: 395 mg/dL — ABNORMAL HIGH (ref 70–99)
Glucose-Capillary: 335 mg/dL — ABNORMAL HIGH (ref 70–99)
Glucose-Capillary: 315 mg/dL — ABNORMAL HIGH (ref 70–99)
Glucose-Capillary: 225 mg/dL — ABNORMAL HIGH (ref 70–99)

## 2021-04-19 LAB — BRAIN NATRIURETIC PEPTIDE: B Natriuretic Peptide: 680.2 pg/mL — ABNORMAL HIGH (ref 0.0–100.0)

## 2021-04-19 LAB — HEPARIN LEVEL (UNFRACTIONATED)
Heparin Unfractionated: 0.55 IU/mL (ref 0.30–0.70)
Heparin Unfractionated: 0.56 IU/mL (ref 0.30–0.70)

## 2021-04-19 MED ORDER — PANTOPRAZOLE SODIUM 40 MG PO TBEC
40.0000 mg | DELAYED_RELEASE_TABLET | Freq: Every day | ORAL | Status: DC
  Administered 2021-04-19 – 2021-04-30 (×12): 40 mg via ORAL
  Filled 2021-04-19 (×12): qty 1

## 2021-04-19 MED ORDER — CLONIDINE HCL 0.1 MG PO TABS
0.1000 mg | ORAL_TABLET | Freq: Three times a day (TID) | ORAL | Status: DC
  Administered 2021-04-19: 0.1 mg via ORAL
  Filled 2021-04-19: qty 1

## 2021-04-19 MED ORDER — HYDRALAZINE HCL 50 MG PO TABS
100.0000 mg | ORAL_TABLET | Freq: Three times a day (TID) | ORAL | Status: DC
  Administered 2021-04-19 – 2021-04-30 (×32): 100 mg via ORAL
  Filled 2021-04-19 (×32): qty 2

## 2021-04-19 MED ORDER — INSULIN ASPART 100 UNIT/ML IJ SOLN
4.0000 [IU] | Freq: Three times a day (TID) | INTRAMUSCULAR | Status: DC
  Administered 2021-04-20 – 2021-04-21 (×4): 4 [IU] via SUBCUTANEOUS
  Filled 2021-04-19 (×4): qty 1

## 2021-04-19 MED ORDER — ISOSORBIDE MONONITRATE ER 60 MG PO TB24
120.0000 mg | ORAL_TABLET | Freq: Every day | ORAL | Status: DC
  Administered 2021-04-20 – 2021-04-30 (×11): 120 mg via ORAL
  Filled 2021-04-19 (×11): qty 2

## 2021-04-19 MED ORDER — INSULIN GLARGINE-YFGN 100 UNIT/ML ~~LOC~~ SOLN
15.0000 [IU] | Freq: Every day | SUBCUTANEOUS | Status: DC
  Administered 2021-04-20 (×2): 15 [IU] via SUBCUTANEOUS
  Filled 2021-04-19 (×4): qty 0.15

## 2021-04-19 MED ORDER — FUROSEMIDE 10 MG/ML IJ SOLN: 40.0000 mg | Freq: Two times a day (BID) | INTRAMUSCULAR | Status: DC

## 2021-04-19 MED ORDER — PREDNISONE 20 MG PO TABS
40.0000 mg | ORAL_TABLET | Freq: Every day | ORAL | Status: AC
  Administered 2021-04-20 – 2021-04-23 (×4): 40 mg via ORAL
  Filled 2021-04-19 (×4): qty 2

## 2021-04-19 MED ORDER — FLUTICASONE-UMECLIDIN-VILANT 200-62.5-25 MCG/ACT IN AEPB: 1.0000 | INHALATION_SPRAY | Freq: Every day | RESPIRATORY_TRACT | Status: DC

## 2021-04-19 MED ORDER — FUROSEMIDE 10 MG/ML IJ SOLN
40.0000 mg | Freq: Two times a day (BID) | INTRAMUSCULAR | Status: DC
  Administered 2021-04-19 – 2021-04-21 (×4): 40 mg via INTRAVENOUS
  Filled 2021-04-19 (×4): qty 4

## 2021-04-19 MED ORDER — HYDRALAZINE HCL 50 MG PO TABS
50.0000 mg | ORAL_TABLET | Freq: Once | ORAL | Status: AC
  Administered 2021-04-19: 50 mg via ORAL
  Filled 2021-04-19: qty 1

## 2021-04-19 MED ORDER — FLUTICASONE FUROATE-VILANTEROL 200-25 MCG/ACT IN AEPB
1.0000 | INHALATION_SPRAY | Freq: Every day | RESPIRATORY_TRACT | Status: DC
  Administered 2021-04-20 – 2021-04-30 (×11): 1 via RESPIRATORY_TRACT
  Filled 2021-04-19: qty 28

## 2021-04-19 NOTE — ED Notes (Signed)
Pt also c/o nausea and took the bipap off ; pt is now on 6L nasal canula

## 2021-04-19 NOTE — ED Notes (Signed)
Pt placed on bedpan at this time.

## 2021-04-19 NOTE — Progress Notes (Signed)
Inpatient Diabetes Program Recommendations  AACE/ADA: New Consensus Statement on Inpatient Glycemic Control (2015)  Target Ranges:  Prepandial:   less than 140 mg/dL      Peak postprandial:   less than 180 mg/dL (1-2 hours)      Critically ill patients:  140 - 180 mg/dL   Lab Results  Component Value Date   GLUCAP 395 (H) 04/19/2021   HGBA1C 6.7 (H) 02/21/2021    Review of Glycemic Control Results for ISAAC, DUBIE (MRN [id number]) as of 04/19/2021 09:41  Ref. Range 04/18/2021 00:45 04/18/2021 08:04 04/18/2021 12:38 04/18/2021 17:28 04/19/2021 08:18  Glucose-Capillary Latest Ref Range: 70 - 99 mg/dL 166 (H) 265 (H) 282 (H) 270 (H) 395 (H)   Diabetes history: DM 2 Outpatient Diabetes medications: 70/30 60 units qam, 40 units qpm Current orders for Inpatient glycemic control:  Semglee 5 units Novolog 0-15 units tid + hs  A1c 6.7% on 8/22 PO prednisone 40 mg Daily start 10/18 Glucose 265 this am  Inpatient Diabetes Program Recommendations:   Glucose almost 400 this am  -  Increase Semglee 20 units -  Add Novolog 4 units tid meal coverage if eating >50% of meals  Thanks,  Tama Headings RN, MSN, BC-ADM Inpatient Diabetes Coordinator Team Pager [phone] (8a-5p)

## 2021-04-19 NOTE — ED Notes (Signed)
At 0340 pt started to have chest pain; pt given 1 Sl nitro tablet; new ekg was printed and hydralazine was given 10mg  for hypertension

## 2021-04-19 NOTE — Progress Notes (Signed)
ANTICOAGULATION CONSULT NOTE  Pharmacy Consult for Heparin Indication: chest pain/ACS  Allergies  Allergen Reactions   Calcium Channel Blockers     Would avoid if possible due to h/o peripheral edema    Patient Measurements: Weight: 120.7 kg (266 lb) Heparin Dosing Weight: 90 kg   Vital Signs: BP: 202/83 (10/18 0916) Pulse Rate: 83 (10/18 0916)  Labs: Recent Labs    04/17/21 2101 04/18/21 0505 04/18/21 0623 04/18/21 0658 04/18/21 1240 04/18/21 1441 04/19/21 0259  HGB 9.9*  --   --   --   --   --  13.0  HCT 31.4*  --   --   --   --   --  39.4  PLT 153  --   --   --   --   --  191  APTT  --   --  24  --   --   --   --   LABPROT  --   --  14.2  --   --   --   --   INR  --   --  1.1  --   --   --   --   HEPARINUNFRC  --   --   --   --   --  0.15* 0.55  CREATININE 2.66* 2.74*  --   --   --   --  2.80*  TROPONINIHS 74* 2,129*  --  1,997* 1,842*  --   --      Estimated Creatinine Clearance: 28.3 mL/min (A) (by C-G formula based on SCr of 2.8 mg/dL (H)).   Medical History: Past Medical History:  Diagnosis Date   Adrenal gland anomaly    Arthritis    CAD (coronary artery disease)    a. 04/2006 MI and PCI/stenting to mLCx & mRCA; b. 07/2009 Cath: patent LCX/RCA stents;  c. 12/2016 NSTEMI/Cath: LM 30, LAD 30p, 36m, 30d, D1/2/3 min irregs, LCX 173m ISR, OM1 min irregs, RCA 20p, 10 ISR, 73m, 50d, RPDA min irregs, RPAV 40-->Med Rx; d. 12/2017 MV: fixed lateral wall scar, mild anterior/anterior septal ischemia. EF 30-44%.   CHF (congestive heart failure) (HCC)    CKD (chronic kidney disease), stage III (HCC)    Colon polyps    DM2 (diabetes mellitus, type 2) (HCC)    insulin requiring   Dyslipidemia    GERD (gastroesophageal reflux disease)    History of echocardiogram    a. TTE 12/2016: EF 50-55%, mod concentric LVH, images inadequate for wall motion assessment, not technically sufficienct to allow for LV dias fxn, calcified mitral annulus    HTN (hypertension)    Kidney  stones    Left Renal artery stenosis ([place])    a. 07/2013 s/p PTA/stenting ([facility]); b. 07/2017 Renal Duplex: No significant RAS.   Myocardial infarction (HCC)    OSA (obstructive sleep apnea)    Pulmonary fibrosis ([place])    a. 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   Recurrent UTI    Rotator cuff disorder    right   Skin cancer    head   Thyroid disorder    Venous insufficiency of both lower extremities    a. s/p laser treatment.    Medications:  (Not in a hospital admission)  Assessment: 75 year old male w/ PMH of moderate persistent asthma, interstitial lung disease, pulmonary hypertension, OSA with chronic respiratory failure on home O2 at 4 L, diastolic heart failure, CAD, DM, CKD IV who was admitted  with ACS/NSTEMI. Pharmacy was consulted for heparin dosing.  No prior anticoag noted    Date/time HL Comment, interpretation 10/17 1441 0.15 Subthera, at 1250 units/hr 10/18 0259 0.55 Therapeutic x 1 10/18 1130 0.56 Therapeutic x2  Goal of Therapy:  Heparin level 0.3-0.7 units/ml Monitor platelets by anticoagulation protocol: Yes   Plan:  Heparin therapeutic, will continue heparin infusion at 1550 units/hr Will transition to daily HL while on heparin ggt Daily CBC while on heparin ggt  Narda Rutherford, PharmD Pharmacy Resident  04/19/2021 1:26 PM

## 2021-04-19 NOTE — Progress Notes (Signed)
ANTICOAGULATION CONSULT NOTE  Pharmacy Consult for Heparin Indication: chest pain/ACS  Allergies  Allergen Reactions   Calcium Channel Blockers     Would avoid if possible due to h/o peripheral edema    Patient Measurements: Weight: 120.7 kg (266 lb) Heparin Dosing Weight: 90 kg   Vital Signs: Temp: 98.5 F (36.9 C) (10/17 1948) Temp Source: Oral (10/17 1948) BP: 177/77 (10/18 0240) Pulse Rate: 76 (10/18 0240)  Labs: Recent Labs    04/17/21 2101 04/18/21 0505 04/18/21 7628 04/18/21 0658 04/18/21 1240 04/18/21 1441 04/19/21 0259  HGB 9.9*  --   --   --   --   --   --   HCT 31.4*  --   --   --   --   --   --   PLT 153  --   --   --   --   --   --   APTT  --   --  24  --   --   --   --   LABPROT  --   --  14.2  --   --   --   --   INR  --   --  1.1  --   --   --   --   HEPARINUNFRC  --   --   --   --   --  0.15* 0.55  CREATININE 2.66* 2.74*  --   --   --   --   --   TROPONINIHS 74* 2,129*  --  1,997* 1,842*  --   --      Estimated Creatinine Clearance: 28.9 mL/min (A) (by C-G formula based on SCr of 2.74 mg/dL (H)).   Medical History: Past Medical History:  Diagnosis Date   Adrenal gland anomaly    Arthritis    CAD (coronary artery disease)    a. 04/2006 MI and PCI/stenting to mLCx & mRCA; b. 07/2009 Cath: patent LCX/RCA stents;  c. 12/2016 NSTEMI/Cath: LM 30, LAD 30p, 32m, 30d, D1/2/3 min irregs, LCX 172m ISR, OM1 min irregs, RCA 20p, 10 ISR, 19m, 50d, RPDA min irregs, RPAV 40-->Med Rx; d. 12/2017 MV: fixed lateral wall scar, mild anterior/anterior septal ischemia. EF 30-44%.   CHF (congestive heart failure) (HCC)    CKD (chronic kidney disease), stage III (HCC)    Colon polyps    DM2 (diabetes mellitus, type 2) (HCC)    insulin requiring   Dyslipidemia    GERD (gastroesophageal reflux disease)    History of echocardiogram    a. TTE 12/2016: EF 50-55%, mod concentric LVH, images inadequate for wall motion assessment, not technically sufficienct to allow for LV  dias fxn, calcified mitral annulus    HTN (hypertension)    Kidney stones    Left Renal artery stenosis ([place])    a. 07/2013 s/p PTA/stenting ([place]); b. 07/2017 Renal Duplex: No significant RAS.   Myocardial infarction (HCC)    OSA (obstructive sleep apnea)    Pulmonary fibrosis ([place])    a. 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   Recurrent UTI    Rotator cuff disorder    right   Skin cancer    head   Thyroid disorder    Venous insufficiency of both lower extremities    a. s/p laser treatment.    Medications:  (Not in a hospital admission)  Assessment: 75 year old male w/ PMH of moderate persistent asthma, interstitial lung disease, pulmonary hypertension, OSA  with chronic respiratory failure on home O2 at 4 L, diastolic heart failure, CAD, DM, CKD IV who was admitted with ACS/NSTEMI. Pharmacy was consulted for heparin dosing.  Baseline CBC, PT/INR, aPTT was within normal limits   No prior anticoag noted  Date/time HL Comment, interpretation 10/17 1441 0.15 Subthera, at 1250 units/hr 10/18 0259 0.55 Therapeutic x 1  Goal of Therapy:  Heparin level 0.3-0.7 units/ml Monitor platelets by anticoagulation protocol: Yes   Plan:  Continue heparin infusion at 1550 units/hr Will recheck HL in 8 hours to confirm. Daily CBC while on heparin ggt  Renda Rolls, PharmD, [facility] 04/19/2021 3:25 AM

## 2021-04-19 NOTE — ED Notes (Signed)
This RN assisted pt off of bedpan, with peri care, and linen change.

## 2021-04-19 NOTE — Evaluation (Signed)
Occupational Therapy Evaluation Patient Details Name: Stephen [name] MRN: [id number] DOB: [birth year] Today's Date: 04/19/2021   History of Present Illness 75 y.o. male with a hx of  pulmonary hypertension, chronic diastolic CHF, CAD s/p L/R heart cath 2018, R heart cath 6/22,, pulmonary fibrosis s/p wedge resection, obesity, COPD on home oxygen, OSA on CPAP, DM2, CKD, renal artery stenosis, and who is being seen today for the evaluation of chest pain and elevated troponin.   Clinical Impression   Pt was seen for OT evaluation and co-tx with PT this date. Prior to hospital admission, pt was modified indep with mobility (pulling his O2 tank holder) and required PRN Min A for LB ADL from spouse whom he lives with in a 1 story ramped entrance home. Pt currently demonstrates impairments in balance, strength, and activity tolerance as described below (See OT problem list) which functionally limit his ability to perform toileting, bathing, dressing, and ADL mobility tasks. Pt currently requires Min A for LB ADL with the exception of MAX A for socks which spouse typically assists with, CGA-Min A for ADL transfers + HHA. Pt endorsed mild dizziness upon sitting EOB but it resolved with time. Pt instructed in PLB to support breath recovery, as pt endorsed mild SOB from start of session. Pt would benefit from skilled OT services to address noted impairments and functional limitations (see below for any additional details) in order to maximize safety and independence while minimizing falls risk and caregiver burden. Upon hospital discharge, initially recommend STR to maximize pt safety and return to PLOF. Anticipate ability to update discharge recommendation next session to home with home health services, pending additional ADL/mobility progress to ensure pt safety maximized.     Recommendations for follow up therapy are one component of a multi-disciplinary discharge planning process, led by the attending  physician.  Recommendations may be updated based on patient status, additional functional criteria and insurance authorization.   Follow Up Recommendations  SNF    Equipment Recommendations  3 in 1 bedside commode    Recommendations for Other Services       Precautions / Restrictions Precautions Precautions: Fall Restrictions Weight Bearing Restrictions: No      Mobility Bed Mobility Overal bed mobility: Needs Assistance Bed Mobility: Supine to Sit;Sit to Supine           General bed mobility comments: OT as +1 for safety, PT assist with bed mobility, please see PT note    Transfers Overall transfer level: Needs assistance Equipment used: 1 person hand held assist Transfers: Sit to/from Stand           General transfer comment: +1 for safety for initial transfer attempt and to manage lines/leads, please see PT note for physical assist    Balance Overall balance assessment: Needs assistance Sitting-balance support: Single extremity supported;Feet supported Sitting balance-Leahy Scale: Fair Sitting balance - Comments: initially poor, but with VC able to maintain sitting EOB without assist   Standing balance support: Single extremity supported Standing balance-Leahy Scale: Fair                             ADL either performed or assessed with clinical judgement   ADL                                         General ADL Comments:  Pt required MAX A for donning socks, requires at least MIN A for LB dressing otherwise. Per pt spouse can assist. Requires CGA-Min A for ADL transfers. Fatigues quickly.     Vision         Perception     Praxis      Pertinent Vitals/Pain Pain Assessment: No/denies pain     Hand Dominance Left (both)   Extremity/Trunk Assessment Upper Extremity Assessment Upper Extremity Assessment: Generalized weakness (R>L UE tremor, worse with intentional tasks)   Lower Extremity Assessment Lower  Extremity Assessment: Generalized weakness       Communication Communication Communication: No difficulties   Cognition Arousal/Alertness: Awake/alert Behavior During Therapy: WFL for tasks assessed/performed Overall Cognitive Status: Within Functional Limits for tasks assessed                                     General Comments  mild dizziness upon sitting EOB, resolved with time    Exercises Other Exercises Other Exercises: Pt instructed in pursed lip breathing to support breath recovery   Shoulder Instructions      Home Living Family/patient expects to be discharged to:: Private residence Living Arrangements: Spouse/significant other Available Help at Discharge: Family;Available 24 hours/day Type of Home: House Home Access: Ramped entrance     Home Layout: One level     Bathroom Shower/Tub: Teacher, early years/pre: Standard                Prior Functioning/Environment Level of Independence: Needs assistance  Gait / Transfers Assistance Needed: Pt reports mod indep with mobility, hold into his O2 tank holder ADL's / Homemaking Assistance Needed: Pt endorses PRN assist from spouse for bathing, socks; spouse drives, cooks, cleans; pt manages his own meds; wears 6L O2 at home   Comments: Pt denies falls in past 45mo        OT Problem List: Decreased strength;Decreased activity tolerance;Impaired balance (sitting and/or standing)      OT Treatment/Interventions: Self-care/ADL training;Therapeutic exercise;Therapeutic activities;Energy conservation;DME and/or AE instruction;Patient/family education;Balance training    OT Goals(Current goals can be found in the care plan section) Acute Rehab OT Goals Patient Stated Goal: go home OT Goal Formulation: With patient/family Time For Goal Achievement: 05/03/21 Potential to Achieve Goals: Good ADL Goals Pt Will Perform Lower Body Dressing: with caregiver independent in assisting;sit to/from  stand (PRN MIN A from spouse) Pt Will Transfer to Toilet: with modified independence;ambulating (LRAD PRN) Additional ADL Goal #1: Pt will verbalize plan to implement at least 2 learned ECS into daily routines to support safety/indep with ADL/IADL.  OT Frequency: Min 1X/week   Barriers to D/C:            Co-evaluation PT/OT/SLP Co-Evaluation/Treatment: Yes Reason for Co-Treatment: For patient/therapist safety;To address functional/ADL transfers PT goals addressed during session: Mobility/safety with mobility OT goals addressed during session: ADL's and self-care      AM-PAC OT "6 Clicks" Daily Activity     Outcome Measure Help from another person eating meals?: None Help from another person taking care of personal grooming?: None Help from another person toileting, which includes using toliet, bedpan, or urinal?: A Little Help from another person bathing (including washing, rinsing, drying)?: A Little Help from another person to put on and taking off regular upper body clothing?: None Help from another person to put on and taking off regular lower body clothing?: A Little 6 Click Score:  21   End of Session Equipment Utilized During Treatment: Oxygen  Activity Tolerance: Patient tolerated treatment well Patient left: in bed;with call bell/phone within reach;with family/visitor present  OT Visit Diagnosis: Other abnormalities of gait and mobility (R26.89);Muscle weakness (generalized) (M62.81)                Time: 2561-5488 OT Time Calculation (min): 23 min Charges:  OT General Charges $OT Visit: 1 Visit OT Evaluation $OT Eval Moderate Complexity: 1 Mod  Ardeth Perfect., MPH, MS, OTR/L ascom [phone] 04/19/21, 4:11 PM

## 2021-04-19 NOTE — ED Notes (Signed)
This RN unable to obtain labs off of existing PIV. Lab contacted & will send phlebotomist to draw aptt level on pt.

## 2021-04-19 NOTE — Progress Notes (Signed)
PROGRESS NOTE    ROLLO FARQUHAR  GQQ:[id number] DOB: 05-Sep-1945 DOA: 04/17/2021 PCP: [name], MD    Brief Narrative:  SABIN GIBEAULT is a 75 y.o. male with medical history significant for Moderate persistent asthma, interstitial lung disease, pulmonary hypertension, OSA with chronic respiratory failure on home O2 at 4 L, followed by pulmonology, as well as diastolic heart failure, CAD, DM, CKD IV, who was brought in by EMS in respiratory distress.  Reports a one week history of progressively worsening shortness of breath not improving after increasing home oxygen to 6-1/2 L from his baseline of 6L.  He has mild productive cough with yellowish-white phlegm but denies fever or chills.  He denies chest pain, nausea vomiting or abdominal pain and denies diarrhea  ED course: On arrival afebrile, BP 155/107 going as high as 204/75 with pulse in the 50s to 60s.  O2 sats 100% on 6 L but with increased work of breathing requiring transition to BiPAP Blood work troponin 74 with BNP 250.  WBC 6 with lactic acid 1.2.  Creatinine 2.66 which is about his baseline for CKD 4  Found with CHF. Cardiology was consulted.  Sbp running 200's, MD was not notified by Nsg, iv hydralazine prn was not also given.   Consultants:  cardiology  Procedures:   Antimicrobials:      Subjective: Pt reports sob little better.  I chest pain earlier this morning none now.  Objective: Vitals:   04/19/21 0240 04/19/21 0300 04/19/21 0344 04/19/21 0505  BP: (!) 177/77 (!) 202/91 (!) 184/86 (!) 155/104  Pulse: 76 76  86  Resp: (!) 23 18  15   Temp:      TempSrc:      SpO2: 93% 95% 91% 94%  Weight:        Intake/Output Summary (Last 24 hours) at 04/19/2021 0850 Last data filed at 04/19/2021 0804 Gross per 24 hour  Intake 137.01 ml  Output 800 ml  Net -662.99 ml   Filed Weights   04/18/21 0604  Weight: 120.7 kg    Examination: Nad calm, less tachypnic +fine scattered rales, decrease bs at  bases No wheezing Regular s1/s2 no gallop Soft obese benign +edema b/l Aaoxox3  Mood and affect appropriate in current setting   Data Reviewed: I have personally reviewed following labs and imaging studies  CBC: Recent Labs  Lab 04/17/21 2101 04/19/21 0259  WBC 6.0 11.0*  NEUTROABS 4.8  --   HGB 9.9* 13.0  HCT 31.4* 39.4  MCV 92.4 88.1  PLT 153 671   Basic Metabolic Panel: Recent Labs  Lab 04/17/21 2101 04/18/21 0505 04/19/21 0259  NA 141 140 137  K 4.0 3.8 3.9  CL 105 106 101  CO2 27 26 25   GLUCOSE 216* 283* 320*  BUN 36* 35* 47*  CREATININE 2.66* 2.74* 2.80*  CALCIUM 8.3* 8.2* 8.5*   GFR: Estimated Creatinine Clearance: 28.3 mL/min (A) (by C-G formula based on SCr of 2.8 mg/dL (H)). Liver Function Tests: Recent Labs  Lab 04/17/21 2101  AST 22  ALT 17  ALKPHOS 84  BILITOT 0.7  PROT 6.8  ALBUMIN 2.9*   No results for input(s): LIPASE, AMYLASE in the last 168 hours. No results for input(s): AMMONIA in the last 168 hours. Coagulation Profile: Recent Labs  Lab 04/18/21 0623  INR 1.1   Cardiac Enzymes: No results for input(s): CKTOTAL, CKMB, CKMBINDEX, TROPONINI in the last 168 hours. BNP (last 3 results) No results for input(s): PROBNP in  the last 8760 hours. HbA1C: No results for input(s): HGBA1C in the last 72 hours. CBG: Recent Labs  Lab 04/18/21 0045 04/18/21 0804 04/18/21 1238 04/18/21 1728 04/19/21 0818  GLUCAP 166* 265* 282* 270* 395*   Lipid Profile: No results for input(s): CHOL, HDL, LDLCALC, TRIG, CHOLHDL, LDLDIRECT in the last 72 hours. Thyroid Function Tests: No results for input(s): TSH, T4TOTAL, FREET4, T3FREE, THYROIDAB in the last 72 hours. Anemia Panel: No results for input(s): VITAMINB12, FOLATE, FERRITIN, TIBC, IRON, RETICCTPCT in the last 72 hours. Sepsis Labs: Recent Labs  Lab 04/17/21 2101 04/18/21 0658  LATICACIDVEN 1.2 0.9    Recent Results (from the past 240 hour(s))  Resp Panel by RT-PCR (Flu A&B, Covid)  Nasopharyngeal Swab     Status: None   Collection Time: 04/17/21  8:58 PM   Specimen: Nasopharyngeal Swab; Nasopharyngeal(NP) swabs in vial transport medium  Result Value Ref Range Status   SARS Coronavirus 2 by RT PCR NEGATIVE NEGATIVE Final    Comment: (NOTE) SARS-CoV-2 target nucleic acids are NOT DETECTED.  The SARS-CoV-2 RNA is generally detectable in upper respiratory specimens during the acute phase of infection. The lowest concentration of SARS-CoV-2 viral copies this assay can detect is 138 copies/mL. A negative result does not preclude SARS-Cov-2 infection and should not be used as the sole basis for treatment or other patient management decisions. A negative result may occur with  improper specimen collection/handling, submission of specimen other than nasopharyngeal swab, presence of viral mutation(s) within the areas targeted by this assay, and inadequate number of viral copies(<138 copies/mL). A negative result must be combined with clinical observations, patient history, and epidemiological information. The expected result is Negative.  Fact Sheet for Patients:  EntrepreneurPulse.com.au  Fact Sheet for Healthcare Providers:  IncredibleEmployment.be  This test is no t yet approved or cleared by the [place] FDA and  has been authorized for detection and/or diagnosis of SARS-CoV-2 by FDA under an Emergency Use Authorization (EUA). This EUA will remain  in effect (meaning this test can be used) for the duration of the COVID-19 declaration under Section 564(b)(1) of the Act, 21 U.S.C.section 360bbb-3(b)(1), unless the authorization is terminated  or revoked sooner.       Influenza A by PCR NEGATIVE NEGATIVE Final   Influenza B by PCR NEGATIVE NEGATIVE Final    Comment: (NOTE) The Xpert Xpress SARS-CoV-2/FLU/RSV plus assay is intended as an aid in the diagnosis of influenza from Nasopharyngeal swab specimens and should not be  used as a sole basis for treatment. Nasal washings and aspirates are unacceptable for Xpert Xpress SARS-CoV-2/FLU/RSV testing.  Fact Sheet for Patients: EntrepreneurPulse.com.au  Fact Sheet for Healthcare Providers: IncredibleEmployment.be  This test is not yet approved or cleared by the [place] FDA and has been authorized for detection and/or diagnosis of SARS-CoV-2 by FDA under an Emergency Use Authorization (EUA). This EUA will remain in effect (meaning this test can be used) for the duration of the COVID-19 declaration under Section 564(b)(1) of the Act, 21 U.S.C. section 360bbb-3(b)(1), unless the authorization is terminated or revoked.  Performed at [facility], [address] [place], [place] [address]          Radiology Studies: DG Chest Portable 1 View  Result Date: 04/17/2021 CLINICAL DATA:  Shortness of breath EXAM: PORTABLE CHEST 1 VIEW COMPARISON:  04/05/2021 FINDINGS: Cardiac shadow is prominent but stable. Aortic calcifications are again seen. Lungs are well aerated bilaterally with persistent airspace opacities bilaterally stable from the prior exam.  No bony abnormality is noted. IMPRESSION: No significant interval change from the prior study. Electronically Signed   By: Inez Catalina M.D.   On: 04/17/2021 21:11   ECHOCARDIOGRAM COMPLETE  Result Date: 04/18/2021    ECHOCARDIOGRAM REPORT   Patient Name:   ORRIE SCHUBERT Date of Exam: 04/18/2021 Medical Rec #:  [id number]        Height:       65.0 in Accession #:    [id number]       Weight:       266.0 lb Date of Birth:  1945-08-25        BSA:          2.233 m Patient Age:    75 years         BP:           190/74 mmHg Patient Gender: M                HR:           83 bpm. Exam Location:  [facility] Procedure: 2D Echo, Color Doppler, Cardiac Doppler and Intracardiac            Opacification Agent Indications:     I50.31 congestive heart failure-Acute Diastolic  History:          Patient has prior history of Echocardiogram examinations, most                  recent 08/06/2020. CAD, CKD; Risk Factors:Hypertension, Diabetes,                  Dyslipidemia and Sleep Apnea.  Sonographer:     Charmayne Sheer Referring Phys:  [id number] Athena Masse Diagnosing Phys: Nelva Bush MD  Sonographer Comments: Technically difficult study due to poor echo windows. Image acquisition challenging due to patient body habitus. IMPRESSIONS  1. Left ventricular ejection fraction, by estimation, is 60 to 65%. The left ventricle has normal function. Left ventricular endocardial border not optimally defined to evaluate regional wall motion. The left ventricular internal cavity size was mildly dilated. There is mild left ventricular hypertrophy. Left ventricular diastolic parameters are consistent with Grade I diastolic dysfunction (impaired relaxation). Elevated left atrial pressure.  2. Right ventricular systolic function is normal. The right ventricular size is not well visualized.  3. The mitral valve is abnormal. Trivial mitral valve regurgitation. Mild mitral stenosis. The mean mitral valve gradient is 5.0 mmHg.  4. Tricuspid valve regurgitation not well assessed.  5. The aortic valve has an indeterminant number of cusps. There is mild calcification of the aortic valve. There is moderate thickening of the aortic valve. Aortic valve regurgitation is trivial. Mild to moderate aortic valve sclerosis/calcification is present, without any evidence of aortic stenosis. FINDINGS  Left Ventricle: Left ventricular ejection fraction, by estimation, is 60 to 65%. The left ventricle has normal function. Left ventricular endocardial border not optimally defined to evaluate regional wall motion. Definity contrast agent was given IV to delineate the left ventricular endocardial borders. The left ventricular internal cavity size was mildly dilated. There is mild left ventricular hypertrophy. Left ventricular diastolic  parameters are consistent with Grade I diastolic dysfunction (impaired relaxation). Elevated left atrial pressure. Right Ventricle: The right ventricular size is not well visualized. Right vetricular wall thickness was not well visualized. Right ventricular systolic function is normal. Left Atrium: Left atrial size was not well visualized. Right Atrium: Right atrial size was not well visualized. Pericardium: The pericardium was not well visualized.  Mitral Valve: The mitral valve is abnormal. There is mild calcification of the mitral valve leaflet(s). Trivial mitral valve regurgitation. Mild mitral valve stenosis. MV peak gradient, 7.3 mmHg. The mean mitral valve gradient is 5.0 mmHg. Tricuspid Valve: The tricuspid valve is not well visualized. Tricuspid valve regurgitation not well assessed. Aortic Valve: The aortic valve has an indeterminant number of cusps. There is mild calcification of the aortic valve. There is moderate thickening of the aortic valve. Aortic valve regurgitation is trivial. Mild to moderate aortic valve sclerosis/calcification is present, without any evidence of aortic stenosis. Aortic valve mean gradient measures 6.0 mmHg. Aortic valve peak gradient measures 11.0 mmHg. Aortic valve area, by VTI measures 3.42 cm. Pulmonic Valve: The pulmonic valve was not well visualized. Pulmonic valve regurgitation is not visualized. No evidence of pulmonic stenosis. Aorta: The aortic root is normal in size and structure. Pulmonary Artery: The pulmonary artery is of normal size. IAS/Shunts: The interatrial septum was not well visualized.  LEFT VENTRICLE PLAX 2D LVIDd:         5.80 cm   Diastology LVIDs:         3.60 cm   LV e' medial:    5.11 cm/s LV PW:         1.10 cm   LV E/e' medial:  19.8 LV IVS:        1.20 cm   LV e' lateral:   5.11 cm/s LVOT diam:     2.60 cm   LV E/e' lateral: 19.8 LV SV:         97 LV SV Index:   44 LVOT Area:     5.31 cm  LEFT ATRIUM         Index LA diam:    4.50 cm 2.02 cm/m   AORTIC VALVE                     PULMONIC VALVE AV Area (Vmax):    2.84 cm      PV Vmax:       1.22 m/s AV Area (Vmean):   2.94 cm      PV Vmean:      89.700 cm/s AV Area (VTI):     3.42 cm      PV VTI:        0.250 m AV Vmax:           166.00 cm/s   PV Peak grad:  6.0 mmHg AV Vmean:          110.000 cm/s  PV Mean grad:  3.0 mmHg AV VTI:            0.284 m AV Peak Grad:      11.0 mmHg AV Mean Grad:      6.0 mmHg LVOT Vmax:         88.80 cm/s LVOT Vmean:        60.900 cm/s LVOT VTI:          0.183 m LVOT/AV VTI ratio: 0.64  AORTA Ao Root diam: 3.50 cm MITRAL VALVE MV Area (PHT): 7.37 cm     SHUNTS MV Area VTI:   3.81 cm     Systemic VTI:  0.18 m MV Peak grad:  7.3 mmHg     Systemic Diam: 2.60 cm MV Mean grad:  5.0 mmHg MV Vmax:       1.35 m/s MV Vmean:      107.0 cm/s MV Decel Time: 103 msec MV E velocity:  101.00 cm/s MV A velocity: 118.00 cm/s MV E/A ratio:  0.86 Christopher End MD Electronically signed by Nelva Bush MD Signature Date/Time: 04/18/2021/12:46:02 PM    Final         Scheduled Meds:  aspirin EC  81 mg Oral Daily   atorvastatin  80 mg Oral QHS   calcitRIOL  0.25 mcg Oral Daily   carvedilol  12.5 mg Oral BID WC   clopidogrel  75 mg Oral Daily   dorzolamide-timolol  1 drop Both Eyes BID   ezetimibe  10 mg Oral Daily   furosemide  40 mg Intravenous BID   gabapentin  1,200 mg Oral QHS   gabapentin  800 mg Oral Daily   hydrALAZINE  50 mg Oral TID   insulin aspart  0-15 Units Subcutaneous TID WC   insulin aspart  0-5 Units Subcutaneous QHS   insulin glargine-yfgn  5 Units Subcutaneous QHS   ipratropium-albuterol  3 mL Nebulization Q6H   isosorbide mononitrate  60 mg Oral Daily   [START ON 04/24/2021] levothyroxine  25 mcg Oral Weekly   levothyroxine  50 mcg Oral Once per day on Mon Tue Wed Thu Fri Sat   montelukast  10 mg Oral Daily   potassium chloride  20 mEq Oral Daily   predniSONE  40 mg Oral Q breakfast   sodium chloride flush  3 mL Intravenous Q12H   tamsulosin  0.4  mg Oral QHS   Continuous Infusions:  sodium chloride     heparin 1,550 Units/hr (04/18/21 2244)    Assessment & Plan:   Active Problems:   Type 2 diabetes mellitus with renal complication (HCC)   OSA (obstructive sleep apnea)   CAD (coronary artery disease)   Essential hypertension, benign   Acute on chronic diastolic CHF (congestive heart failure) (HCC)   Pulmonary hypertension, primary (HCC)   Acute on chronic respiratory failure with hypoxemia (HCC)   CKD (chronic kidney disease) stage 4, GFR 15-29 ml/min (HCC)   ILD (interstitial lung disease) (HCC)   COPD with acute exacerbation (HCC)    NSTEMI CAD Troponin elevated Cardiology following  EF 60 to 65%.  Grade 1 diastolic dysfunction. Continue Coreg Imdur increased to 120 mg daily for BP support I increase hydralazine for BP support 200 mg 3 times daily Continue Plavix, statin, Zetia Continue IV heparin for at least 48 hours no plan for catheterization due to renal function with risk of contrast-induced nephropathy and patient may be unable to lay flat     Acute on chronic respiratory failure with hypoxemia  - Patient presenting in acute respiratory distress, speaking in short sentences, requiring BiPAP - Etiology related to COPD and CHF exacerbation superimposed on chronic underlying pulmonary conditions including ILD, OSA, PAH 10/18 has been off of BiPAP on nasal cannula No wheezing on exam Will switch IV steroids to p.o. Continue IV Lasix Continue inhalers    Acute on chronic diastolic CHF  hypertensive emergency Blood pressure still elevated needs better control Imdur was increased as above increase hydralazine 100 mg 3 times daily Continue Coreg, Imdur On clonidine at home, will start low and increase slowly not to drop bp too fast. Continue iv lasix I's and O's Daily weight    COPD with acute exacerbation (HCC) Start prednisone p.o. Alogliptin from home Continue inhalers Keep O2 sat above  92%           Pulmonary hypertension, primary ([place])   ILD (interstitial lung disease) (HCC)   OSA - Last seen  by pulmonology in August 2022 10/17 hold sildanefil May consider pccm if needed      CKD (chronic kidney disease) stage 4,  Baseline cr 2.6 Worsening today cr 2.89 Monitor closely on iv lasix closely      Type 2 diabetes mellitus with renal complication (HCC) BG uncontrolled Increase Lantus to 15 units Add NovoLog 4 units 3 times daily with meals   DVT prophylaxis: Heparin Code Status: Full Family Communication: Wife at bedside Disposition Plan:  Status is: Inpatient  Remains inpatient appropriate because:Inpatient level of care appropriate due to severity of illness   Dispo: The patient is from: Home              Anticipated d/c is to: Home              Patient currently is not medically stable to d/c.              Difficult to place patient No            LOS: 2 days   Time spent: 45 min with >50% on coc    Nolberto Hanlon, MD [facility] Pager [phone]  If 7PM-7AM, please contact night-coverage 04/19/2021, 8:50 AM

## 2021-04-19 NOTE — Evaluation (Signed)
Physical Therapy Evaluation Patient Details Name: Stephen [name] MRN: [id number] DOB: 1946-02-15 Today's Date: 04/19/2021  History of Present Illness  75 y.o. male with a hx of  pulmonary hypertension, chronic diastolic CHF, CAD s/p L/R heart cath 2018, R heart cath 6/22,, pulmonary fibrosis s/p wedge resection, obesity, COPD on home oxygen, OSA on CPAP, DM2, CKD, renal artery stenosis, and who is being seen today for the evaluation of chest pain and elevated troponin.   Clinical Impression  Pt received supine in bed, agreeable to therapy. Pt required MOD A to lift trunk supine>sit, CGA to return to supine. STS and standing balance CGA with HHA, pt able to remain standing for less than 2 minutes due to fatigue. Ambulation limited due to pt lines and leads - pt requesting to remain connected to condom cath. Pt presents with overall strength deficits, decreased muscular endurance and stability. Currently rec SNF, pending further evaluation of mobility including ambulation. Pt remained on 6L O2 via [place] - pt baseline. Would benefit from skilled PT to address above deficits and promote optimal return to PLOF.        Recommendations for follow up therapy are one component of a multi-disciplinary discharge planning process, led by the attending physician.  Recommendations may be updated based on patient status, additional functional criteria and insurance authorization.  Follow Up Recommendations SNF;Supervision for mobility/OOB    Equipment Recommendations  Other (comment) (TBD at next venue of care. If home will need RW.)    Recommendations for Other Services       Precautions / Restrictions Precautions Precautions: Fall Restrictions Weight Bearing Restrictions: No      Mobility  Bed Mobility Overal bed mobility: Needs Assistance Bed Mobility: Supine to Sit;Sit to Supine     Supine to sit: Mod assist Sit to supine: Min guard   General bed mobility comments: MOD A to lift trunk; VC  on sequencing, hand placement and pt engagement. CGA for safety to return to bed, no physical assist required.    Transfers Overall transfer level: Needs assistance Equipment used: 1 person hand held assist Transfers: Sit to/from Stand Sit to Stand: Min guard;From elevated surface         General transfer comment: CGA to steady, pt demo forward trunk flexion. STS x3 reps.  Ambulation/Gait Ambulation/Gait assistance: Min guard Gait Distance (Feet): 2 Feet Assistive device: 1 person hand held assist       General Gait Details: CGA to take side steps along EOB via HHA.  Stairs            Wheelchair Mobility    Modified Rankin (Stroke Patients Only)       Balance Overall balance assessment: Needs assistance Sitting-balance support: Single extremity supported;Feet supported Sitting balance-Leahy Scale: Fair Sitting balance - Comments: initially poor, but with VC able to maintain sitting EOB without assist   Standing balance support: Single extremity supported Standing balance-Leahy Scale: Fair Standing balance comment: CGA to steady on occasion                             Pertinent Vitals/Pain Pain Assessment: No/denies pain    Home Living Family/patient expects to be discharged to:: Private residence Living Arrangements: Spouse/significant other Available Help at Discharge: Family;Available 24 hours/day Type of Home: House Home Access: Ramped entrance     Home Layout: One level Home Equipment: None      Prior Function Level of Independence: Needs assistance  Gait / Transfers Assistance Needed: Pt reports mod indep with mobility, hold into his O2 tank holder  ADL's / Homemaking Assistance Needed: Pt endorses PRN assist from spouse for bathing, socks; spouse drives, cooks, cleans; pt manages his own meds; wears 6L O2 at home  Comments: Pt denies falls in past 79mo. States he walks household distances and uses w/c supplied by doctors  office/store when in the community     Hand Dominance   Dominant Hand: Left (both)    Extremity/Trunk Assessment   Upper Extremity Assessment Upper Extremity Assessment: Generalized weakness    Lower Extremity Assessment Lower Extremity Assessment: Generalized weakness       Communication   Communication: No difficulties  Cognition Arousal/Alertness: Awake/alert Behavior During Therapy: WFL for tasks assessed/performed Overall Cognitive Status: Within Functional Limits for tasks assessed                                 General Comments: A&Ox4      General Comments General comments (skin integrity, edema, etc.): mild dizziness upon sitting EOB, resolved with time    Exercises Other Exercises Other Exercises: Pt instructed in pursed lip breathing to support breath recovery   Assessment/Plan    PT Assessment Patient needs continued PT services  PT Problem List Decreased strength;Decreased mobility;Decreased safety awareness;Decreased activity tolerance;Decreased balance       PT Treatment Interventions DME instruction;Therapeutic exercise;Gait training;Balance training;Neuromuscular re-education;Functional mobility training;Therapeutic activities;Patient/family education    PT Goals (Current goals can be found in the Care Plan section)  Acute Rehab PT Goals Patient Stated Goal: go home PT Goal Formulation: With patient Time For Goal Achievement: 05/03/21 Potential to Achieve Goals: Fair    Frequency Min 2X/week   Barriers to discharge        Co-evaluation PT/OT/SLP Co-Evaluation/Treatment: Yes Reason for Co-Treatment: For patient/therapist safety;To address functional/ADL transfers PT goals addressed during session: Mobility/safety with mobility OT goals addressed during session: ADL's and self-care       AM-PAC PT "6 Clicks" Mobility  Outcome Measure Help needed turning from your back to your side while in a flat bed without using  bedrails?: A Little Help needed moving from lying on your back to sitting on the side of a flat bed without using bedrails?: A Little Help needed moving to and from a bed to a chair (including a wheelchair)?: A Little Help needed standing up from a chair using your arms (e.g., wheelchair or bedside chair)?: A Little Help needed to walk in hospital room?: A Little Help needed climbing 3-5 steps with a railing? : A Lot 6 Click Score: 17    End of Session Equipment Utilized During Treatment: Oxygen Activity Tolerance: Patient tolerated treatment well;Patient limited by fatigue Patient left: in bed;with call bell/phone within reach;with family/visitor present Nurse Communication: Mobility status PT Visit Diagnosis: Unsteadiness on feet (R26.81);Other abnormalities of gait and mobility (R26.89);Muscle weakness (generalized) (M62.81);Difficulty in walking, not elsewhere classified (R26.2)    Time: 8657-8469 PT Time Calculation (min) (ACUTE ONLY): 23 min   Charges:   PT Evaluation $PT Eval Moderate Complexity: 1 Mod          Chadwin Fury PT, DPT 04/19/21 5:13 PM [phone]

## 2021-04-19 NOTE — ED Notes (Signed)
This RN put pt on bedpan at this time.

## 2021-04-19 NOTE — ED Notes (Signed)
This RN called & spoke with RT re: pt requesting to go back on BiPAP. RT will come assess pt.

## 2021-04-19 NOTE — ED Notes (Signed)
Pt with meal tray and family at bedside assisting pt.

## 2021-04-19 NOTE — ED Notes (Signed)
Pt placed on bedpan. Call light within reach-pt will call when he is finished.

## 2021-04-19 NOTE — ED Notes (Signed)
Pt stated that he feels better after the 2nd nitro tablet; giving zofran to relieve any nausea at this time.

## 2021-04-19 NOTE — Progress Notes (Signed)
Progress Note  Patient Name: Stephen [name] Date of Encounter: 04/19/2021  Primary Cardiologist: Ida Rogue, MD   Subjective   Patient started having diarrhea overnight.  He reports he feels warm.  Earlier episode of CP this morning. Reports this CP happened when they had him laying flat in the bed and took off his oxygen to change him earlier this morning. It was associated with SOB and improved when they elevated the bed back to 90 degrees.  Overall, he feels his breathing is improved, however.  They wonder about his glucose levels and redness that has appeared on his hands and face.  Inpatient Medications    Scheduled Meds:  aspirin EC  81 mg Oral Daily   atorvastatin  80 mg Oral QHS   calcitRIOL  0.25 mcg Oral Daily   carvedilol  12.5 mg Oral BID WC   clopidogrel  75 mg Oral Daily   dorzolamide-timolol  1 drop Both Eyes BID   ezetimibe  10 mg Oral Daily   furosemide  40 mg Intravenous BID   gabapentin  1,200 mg Oral QHS   gabapentin  800 mg Oral Daily   hydrALAZINE  50 mg Oral TID   insulin aspart  0-15 Units Subcutaneous TID WC   insulin aspart  0-5 Units Subcutaneous QHS   insulin glargine-yfgn  5 Units Subcutaneous QHS   ipratropium-albuterol  3 mL Nebulization Q6H   isosorbide mononitrate  60 mg Oral Daily   [START ON 04/24/2021] levothyroxine  25 mcg Oral Weekly   levothyroxine  50 mcg Oral Once per day on Mon Tue Wed Thu Fri Sat   montelukast  10 mg Oral Daily   potassium chloride  20 mEq Oral Daily   predniSONE  40 mg Oral Q breakfast   sodium chloride flush  3 mL Intravenous Q12H   tamsulosin  0.4 mg Oral QHS   Continuous Infusions:  sodium chloride     heparin 1,550 Units/hr (04/18/21 2244)   PRN Meds: sodium chloride, acetaminophen, albuterol, fluticasone, guaiFENesin-dextromethorphan, hydrALAZINE, HYDROcodone-acetaminophen, nitroGLYCERIN, ondansetron (ZOFRAN) IV, sodium chloride flush   Vital Signs    Vitals:   04/19/21 0344 04/19/21  0505 04/19/21 0830 04/19/21 0916  BP: (!) 184/86 (!) 155/104 (!) 209/97 (!) 202/83  Pulse:  86 87 83  Resp:  15 16   Temp:      TempSrc:      SpO2: 91% 94% 95%   Weight:        Intake/Output Summary (Last 24 hours) at 04/19/2021 1116 Last data filed at 04/19/2021 1010 Gross per 24 hour  Intake 140.01 ml  Output 1500 ml  Net -1359.99 ml   Last 3 Weights 04/18/2021 03/08/2021 02/21/2021  Weight (lbs) 266 lb 268 lb 265 lb  Weight (kg) 120.657 kg 121.564 kg 120.203 kg  Some encounter information is confidential and restricted. Go to Review Flowsheets activity to see all data.      Telemetry    NSR, PVCs 80s to low 100s - Personally Reviewed  ECG    No new tracings today - Personally Reviewed  Physical Exam   GEN: Obese male lying in bed, work of breathing noted.  Joined by his wife. Neck: JVD difficult to assess due to body habitus Cardiac: Distant, RRR, no murmurs, rubs, or gallops.  Respiratory: Distant breath sounds and mildly reduced at bases, trace bibasilar crackles GI: Firm, distended MS: 2-3+ bilateral pitting edema; No deformity. Neuro:  Nonfocal  Psych: Normal affect   Labs  High Sensitivity Troponin:   Recent Labs  Lab 04/05/21 2016 04/17/21 2101 04/18/21 0505 04/18/21 0658 04/18/21 1240  TROPONINIHS 107* 74* 2,129* 1,997* 1,842*      Chemistry Recent Labs  Lab 04/17/21 2101 04/18/21 0505 04/19/21 0259  NA 141 140 137  K 4.0 3.8 3.9  CL 105 106 101  CO2 27 26 25   GLUCOSE 216* 283* 320*  BUN 36* 35* 47*  CREATININE 2.66* 2.74* 2.80*  CALCIUM 8.3* 8.2* 8.5*  PROT 6.8  --   --   ALBUMIN 2.9*  --   --   AST 22  --   --   ALT 17  --   --   ALKPHOS 84  --   --   BILITOT 0.7  --   --   GFRNONAA 25* 24* 23*  ANIONGAP 9 8 11      Hematology Recent Labs  Lab 04/17/21 2101 04/19/21 0259  WBC 6.0 11.0*  RBC 3.40* 4.47  HGB 9.9* 13.0  HCT 31.4* 39.4  MCV 92.4 88.1  MCH 29.1 29.1  MCHC 31.5 33.0  RDW 15.1 15.3  PLT 153 191     BNP Recent Labs  Lab 04/17/21 2058  BNP 250.6*     DDimer No results for input(s): DDIMER in the last 168 hours.   Radiology    DG Chest Port 1 View  Result Date: 04/19/2021 CLINICAL DATA:  Shortness of breath, chest pain, history of CHF EXAM: PORTABLE CHEST 1 VIEW COMPARISON:  Chest radiograph 04/17/2021 FINDINGS: The heart is enlarged, unchanged. The mediastinal contours are stable. Increased vascular markings are again seen throughout both lungs, slightly improved in the interim. There is no new or worsening focal airspace disease. There is no pleural effusion. There is no pneumothorax. There is no acute osseous abnormality. IMPRESSION: Increased interstitial markings in both lungs are slightly improved compared to the prior study, suggesting improving pulmonary edema and/or infection Electronically Signed   By: Valetta Mole M.D.   On: 04/19/2021 09:10   DG Chest Portable 1 View  Result Date: 04/17/2021 CLINICAL DATA:  Shortness of breath EXAM: PORTABLE CHEST 1 VIEW COMPARISON:  04/05/2021 FINDINGS: Cardiac shadow is prominent but stable. Aortic calcifications are again seen. Lungs are well aerated bilaterally with persistent airspace opacities bilaterally stable from the prior exam. No bony abnormality is noted. IMPRESSION: No significant interval change from the prior study. Electronically Signed   By: Inez Catalina M.D.   On: 04/17/2021 21:11   ECHOCARDIOGRAM COMPLETE  Result Date: 04/18/2021    ECHOCARDIOGRAM REPORT   Patient Name:   Stephen [name] [facility] Date of Exam: 04/18/2021 Medical Rec #:  [id number]        Height:       65.0 in Accession #:    [id number]       Weight:       266.0 lb Date of Birth:  1945/08/28        BSA:          2.233 m Patient Age:    75 years         BP:           190/74 mmHg Patient Gender: M                HR:           83 bpm. Exam Location:  [facility] Procedure: 2D Echo, Color Doppler, Cardiac Doppler and Intracardiac            Opacification  Agent Indications:      I50.31 congestive heart failure-Acute Diastolic  History:         Patient has prior history of Echocardiogram examinations, most                  recent 08/06/2020. CAD, CKD; Risk Factors:Hypertension, Diabetes,                  Dyslipidemia and Sleep Apnea.  Sonographer:     Charmayne Sheer Referring Phys:  [id number] Athena Masse Diagnosing Phys: Nelva Bush MD  Sonographer Comments: Technically difficult study due to poor echo windows. Image acquisition challenging due to patient body habitus. IMPRESSIONS  1. Left ventricular ejection fraction, by estimation, is 60 to 65%. The left ventricle has normal function. Left ventricular endocardial border not optimally defined to evaluate regional wall motion. The left ventricular internal cavity size was mildly dilated. There is mild left ventricular hypertrophy. Left ventricular diastolic parameters are consistent with Grade I diastolic dysfunction (impaired relaxation). Elevated left atrial pressure.  2. Right ventricular systolic function is normal. The right ventricular size is not well visualized.  3. The mitral valve is abnormal. Trivial mitral valve regurgitation. Mild mitral stenosis. The mean mitral valve gradient is 5.0 mmHg.  4. Tricuspid valve regurgitation not well assessed.  5. The aortic valve has an indeterminant number of cusps. There is mild calcification of the aortic valve. There is moderate thickening of the aortic valve. Aortic valve regurgitation is trivial. Mild to moderate aortic valve sclerosis/calcification is present, without any evidence of aortic stenosis. FINDINGS  Left Ventricle: Left ventricular ejection fraction, by estimation, is 60 to 65%. The left ventricle has normal function. Left ventricular endocardial border not optimally defined to evaluate regional wall motion. Definity contrast agent was given IV to delineate the left ventricular endocardial borders. The left ventricular internal cavity size was mildly dilated. There is mild  left ventricular hypertrophy. Left ventricular diastolic parameters are consistent with Grade I diastolic dysfunction (impaired relaxation). Elevated left atrial pressure. Right Ventricle: The right ventricular size is not well visualized. Right vetricular wall thickness was not well visualized. Right ventricular systolic function is normal. Left Atrium: Left atrial size was not well visualized. Right Atrium: Right atrial size was not well visualized. Pericardium: The pericardium was not well visualized. Mitral Valve: The mitral valve is abnormal. There is mild calcification of the mitral valve leaflet(s). Trivial mitral valve regurgitation. Mild mitral valve stenosis. MV peak gradient, 7.3 mmHg. The mean mitral valve gradient is 5.0 mmHg. Tricuspid Valve: The tricuspid valve is not well visualized. Tricuspid valve regurgitation not well assessed. Aortic Valve: The aortic valve has an indeterminant number of cusps. There is mild calcification of the aortic valve. There is moderate thickening of the aortic valve. Aortic valve regurgitation is trivial. Mild to moderate aortic valve sclerosis/calcification is present, without any evidence of aortic stenosis. Aortic valve mean gradient measures 6.0 mmHg. Aortic valve peak gradient measures 11.0 mmHg. Aortic valve area, by VTI measures 3.42 cm. Pulmonic Valve: The pulmonic valve was not well visualized. Pulmonic valve regurgitation is not visualized. No evidence of pulmonic stenosis. Aorta: The aortic root is normal in size and structure. Pulmonary Artery: The pulmonary artery is of normal size. IAS/Shunts: The interatrial septum was not well visualized.  LEFT VENTRICLE PLAX 2D LVIDd:         5.80 cm   Diastology LVIDs:         3.60 cm   LV e' medial:  5.11 cm/s LV PW:         1.10 cm   LV E/e' medial:  19.8 LV IVS:        1.20 cm   LV e' lateral:   5.11 cm/s LVOT diam:     2.60 cm   LV E/e' lateral: 19.8 LV SV:         97 LV SV Index:   44 LVOT Area:     5.31 cm   LEFT ATRIUM         Index LA diam:    4.50 cm 2.02 cm/m  AORTIC VALVE                     PULMONIC VALVE AV Area (Vmax):    2.84 cm      PV Vmax:       1.22 m/s AV Area (Vmean):   2.94 cm      PV Vmean:      89.700 cm/s AV Area (VTI):     3.42 cm      PV VTI:        0.250 m AV Vmax:           166.00 cm/s   PV Peak grad:  6.0 mmHg AV Vmean:          110.000 cm/s  PV Mean grad:  3.0 mmHg AV VTI:            0.284 m AV Peak Grad:      11.0 mmHg AV Mean Grad:      6.0 mmHg LVOT Vmax:         88.80 cm/s LVOT Vmean:        60.900 cm/s LVOT VTI:          0.183 m LVOT/AV VTI ratio: 0.64  AORTA Ao Root diam: 3.50 cm MITRAL VALVE MV Area (PHT): 7.37 cm     SHUNTS MV Area VTI:   3.81 cm     Systemic VTI:  0.18 m MV Peak grad:  7.3 mmHg     Systemic Diam: 2.60 cm MV Mean grad:  5.0 mmHg MV Vmax:       1.35 m/s MV Vmean:      107.0 cm/s MV Decel Time: 103 msec MV E velocity: 101.00 cm/s MV A velocity: 118.00 cm/s MV E/A ratio:  0.86 Harrell Gave End MD Electronically signed by Nelva Bush MD Signature Date/Time: 04/18/2021/12:46:02 PM    Final     Cardiac Studies   Echo 04/18/21  1. Left ventricular ejection fraction, by estimation, is 60 to 65%. The  left ventricle has normal function. Left ventricular endocardial border  not optimally defined to evaluate regional wall motion. The left  ventricular internal cavity size was mildly  dilated. There is mild left ventricular hypertrophy. Left ventricular  diastolic parameters are consistent with Grade I diastolic dysfunction  (impaired relaxation). Elevated left atrial pressure.   2. Right ventricular systolic function is normal. The right ventricular  size is not well visualized.   3. The mitral valve is abnormal. Trivial mitral valve regurgitation. Mild  mitral stenosis. The mean mitral valve gradient is 5.0 mmHg.   4. Tricuspid valve regurgitation not well assessed.   5. The aortic valve has an indeterminant number of cusps. There is mild  calcification  of the aortic valve. There is moderate thickening of the  aortic valve. Aortic valve regurgitation is trivial. Mild to moderate  aortic valve sclerosis/calcification is  present, without  any evidence of aortic stenosis.   [place] 12/06/20 Successful right heart catheterization via the right brachial vein. Right heart catheterization showed mildly to moderately elevated filling pressures, mild pulmonary hypertension and high cardiac output. RA: 13 mmHg PCW: 20 mmHg with a V wave of 27 mmHg PA: 44/ 18 with a mean of 30 mmHg.  Pulmonary vascular resistance is only 1.14 [place] unit RV: 48 /12 mmHg PA sat is 97% with cardiac output of 8.79 and cardiac index of 3.98. Recommendations: Recommend increasing torsemide to 40 mg once daily.  Check basic metabolic profile in 1 week.   Coronary angiography 08/19/20 Coronary Diagrams     Diagnostic Dominance: Right     Coronary angiography:  Coronary dominance: Right  Left mainstem:   Large vessel that bifurcates into the LAD and left circumflex  Left anterior descending (LAD):   Large vessel that extends to the apical region, diagonal branch 2 of moderate size, moderate proximal and mid LAD disease.  Moderate ostial diagonal disease D1-D2  Left circumflex (LCx):  Large vessel with OM branch 2, prior stent mid vessel, occluded at this location, collaterals from left to left, right to left  Right coronary artery (RCA):  Right dominant vessel with PL and PDA, moderate to severe mid vessel disease 70%, 50% distal disease  Left ventriculography: Crossed for pressures, LV gram not performed  Final Conclusions:   Challenging access  given morbid obesity, weight 270 pounds Severe left circumflex disease occluded mid vessel, known from prior catheterization, collaterals Moderate to severe mid to distal RCA disease, unchanged from prior catheterization Moderate proximal and mid LAD disease Difficult groin access given morbid obesity, arterial accessed  easily, Ultrasound used to visualize the vein, not well visualized apart from distal vessel but was too proximal to the artery to engage Grossly vein did not appear markedly dilated.  Recommendations:  Medical management recommended If right heart catheterization felt needed, would need to change access.  Neck not a appropriate choice given thick neck, obesity --Markedly elevated blood pressure during the case Recommend aggressive blood pressure control, this is likely contributing to his shortness of breath -Also with morbid obesity, weight continues to trend upwards, recommend strict low carbohydrate diet Goal weight 200   LM lesion is 30% stenosed. Prox LAD lesion is 40% stenosed. Mid LAD lesion is 50% stenosed. Dist LAD lesion is 30% stenosed. Mid Cx lesion is 100% stenosed. Prox RCA lesion is 20% stenosed. Dist RCA lesion is 50% stenosed. Mid RCA-1 lesion is 10% stenosed. Mid RCA-2 lesion is 70% stenosed. RPAV lesion is 40% stenosed. 2nd Diag lesion is 50% stenosed. 1st Diag lesion is 45% stenosed.         Echo 08/06/20  1. Left ventricular ejection fraction, by estimation, is 55 to 60%. The  left ventricle has normal function. The left ventricle has no regional  wall motion abnormalities. Left ventricular diastolic parameters are  consistent with Grade I diastolic  dysfunction (impaired relaxation).   2. Right ventricular systolic function is normal. The right ventricular  size is normal.   3. Left atrial size was moderately dilated.   4. Challenging images    US renal artery 07/2020 Summary:  Renal:  Right: Normal size right kidney. 1-59% stenosis of the right renal         artery. Unable to visualize the Aorta and segments of the         Right Renal Artery.  Left:  Normal size of left kidney. Technically difficult due to  bowel Gas; unable to visualize the Renal Artery.     Patient Profile     75 y.o. male with a hx of  pulmonary hypertension, chronic  diastolic CHF, CAD, pulmonary fibrosis s/p wedge resection, obesity, COPD on home oxygen, OSA on CPAP, DM2, CKD, renal artery stenosis, and who is being seen today for the evaluation of chest pain and elevated HS Tn.  Assessment & Plan    Acute on Chronic diastolic heart failure   Pulmonary hypertension --Reports improving breathing status. Respiratory distress likely a combination of exacerbation of HFpEF, ILD, poorly controlled BP, and OHS.  Most recent echo / [place] as above. He was started on prednisone last night with worsening volume status and BP this AM.  He is significantly volume up on exam today.  BNP and Cr bumped overnight. Continue IV diuresis with close monitoring of renal function.  Monitor I's/O's, daily standing weights.   Output/ wt not well tracked with RN order placed. Daily BMET.  Maintain electrolytes at goal. Will order repeat Cr for later today. Cr 2.74  2.80, BUN 35  47.  BNP 250.6  680.2. Continue Coreg.  Increased to Imdur 120mg  daily for BP support.  Wean prednisone as tolerated, as steroids are associated with worsening volume status and hyperglycemia.  Non-STEMI Coronary artery disease -- Earlier CP that on further discussion is clarified as orthopnea with CP/SOB only when laying flat. He has known multivessel CAD. 2018 LHC with recommendation for medical therapy at that time.  HS Tn peaked at 2129, now downtrending. EKG at presentation without acute changes.  Given history of CAD, echo obtained for further workup and as above with EF 60 to 65%. HS Tn elevation and presentation thought most consistent with supply demand ischemia in the setting of his current volume overload, elevated BP, and acute on chronic kidney disease.  Continue IV heparin for at least 48h (10/17-10/19) Ordered CBC for today Continue PTA ASA, Plavix, carvedilol, atorvastatin, Zetia, and Imdur.   Increased to Imdur 120mg  daily for further BP support and antianginal effect No plan for  catheterization given patient's renal function and as he is at risk for contrast-induced nephropathy and unable to lay flat due to significant orthopnea Recommend ongoing medical management, along with BP control /  IV diuresis with close monitoring of renal function. Aggressive risk factor modification recommended.    Leukocytosis/Diarrhea --Reports recent diarrhea, feeling of warmth, and skin redness.  WBC 04/1810 0.0.  Will order repeat CBC for today.  Further work-up /management per IM.   Hypokalemia -- Potassium 3.9.  Continue to replete with goal 4.0. Check Mg with goal 2.0.   HTN, goal BP less than 130/80 -- BP poorly controlled.  PTA regimen included clonidine, Imdur, torsemide, hydralazine, and Coreg.  Continue increased dose Imdur 120 mg daily, hydralazine, and Coreg.  Continue IV diuresis.  Suspect BP improvement with improved volume status.  Ideally, wean off of hydralazine and clonidine in the near future to avoid rebound hypertension and rebound tachycardia.   CKD 3 -- Followed by [facility] in the outpatient setting.  Baseline Cr 2.5-2.7.  Cr bump overnight as above.  Consider nephrology consult.  Continue to follow with [facility].    DM2 -- SSI, per IM.  Wean prednisone as tolerated, given hyperglycemia.   Hypothyroidism -- Continue treatment per IM.  02/21/2021 TSH 7.12.   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  Reports increasing home O2 to 6L daily.  Remains  on nasal cannula oxygen.  Wean prednisone as tolerated given hyperglycemia and volume overload.  OSA on CPAP --Recommend CPAP use at night.   For questions or updates, please contact [place] Please consult [email or web address] for contact info under        Signed, Arvil Chaco, PA-C  04/19/2021, 11:16 AM

## 2021-04-19 NOTE — ED Notes (Signed)
Pt cleaned up, sheets changed, new male purewick placed. No additional needs identified at this time. Bed low & locked; call light & personal items within reach. Wife at [facility].

## 2021-04-19 NOTE — Consult Note (Addendum)
   Heart Failure Nurse Navigator Note  HFpEF 60 to 65%.  Normal right ventricular systolic function.  Mild LVH.  He presented to the emergency room with complaints of shortness of breath, mild lower extremity edema, productive cough and chest discomfort.   Comorbidities:  Interstitial lung disease Pulmonary hypertension Obstructive sleep apnea Chronic home O2 use at 4 L Coronary artery disease Chronic kidney disease Diabetes Arthritis Morbid obesity  Medications:  Isosorbide mononitrate 60 mg daily Coreg 12 and half milligrams twice a day Plavix 75 mg daily Zetia 10 mg daily Potassium chloride 20 mEq daily  Home medications include:  aspirin 81 mg daily  atorvastatin 80 mg daily Clonidine 0.3 mg 3 times a day Hydralazine 50 mg 3 times a day  Labs:  Sodium 137, potassium 3.9, chloride 101, CO2 25, BUN 47 up from 35 of yesterday, creatinine 2.8 up from 2.7 of yesterday.  BNP on admission was 259, lactic acid was 0.9.  Initial troponin level was 2129 but have been trending down.   Met with patient, wife and daughter who are at the bedside in the emergency room.  Currently sitting 45 degree angle on a gurney in the emergency room on nasal cannula in no acute distress.  He states that he has a scale but does not weigh himself, wife states that she even moved it in to the den   to make it more convenient for him to weigh himself but he does not.  Discussed the importance of daily weight, recording and reporting weight gain or weight loss to physician.  Discussed the importance of fluid restriction, from what he listed that he drinks daily he does not go over 64 ounces in a 24-hour period.  Discussed salt and sodium intake.  Patient does continue to use salt on some foods at the table.  Explained the relationship between sodium and fluids also instructed him that would help with management of his blood pressures also.  Wife does read labels, and they try to eat fresh fruits,   frozen and fresh vegetables and eat at restaurants very little.  Discussed his upcoming appointment with the outpatient heart failure clinic on October 24 at 8:30 in the morning.  He has a 5% rate of no-show, 10 out of 211 appointments.  They had no further questions, was given the living with heart failure teaching booklet along with information on low-sodium.  Explained that I would continue to follow along.  Pricilla Riffle RN [facility]

## 2021-04-19 NOTE — ED Notes (Signed)
Pt assisted with moving up in bed. Pt able to scoot self fairly well in bed, needed some assistance. Pt appears winded and tired after movement. O2 and HR stable

## 2021-04-19 NOTE — TOC Initial Note (Addendum)
Transition of Care [facility]) - Initial/Assessment Note    Patient Details  Name: Stephen [name] MRN: [id number] Date of Birth: 11-Dec-1945  Transition of Care [facility]) CM/SW Contact:    Ova Freshwater Phone Number: [phone] 04/19/2021, 11:04 AM  Clinical Narrative:                  Patient presents to [facility] from home due to SOB and chest pain.  Patient uses 6L at baseline.  Patient remains inpatient in the ED.  CSW spoke with patient's spouse Jadier Rockers [phone] for collateral information.  Patient lives with spouse, needs assistance with some ADLs. Ms. Denardo stated they prefer home health regardless of PT recommendation.  Ms. Cheatwood stated [employer] supplies O2, Ms. Tweed stated the patient receives regular deliveries of O2 and they contact [employer] if they need assistance. TOC will continue to follow for disposition.  Expected Discharge Plan: [place] Barriers to Discharge: Continued Medical Work up   Patient Goals and CMS Choice Patient states their goals for this hospitalization and ongoing recovery are:: Patient wants to return home with home health, no SNF placement.      Expected Discharge Plan and Services Expected Discharge Plan: [place] In-house Referral: Clinical Social Work   Post Acute Care Choice: [place] arrangements for the past 2 months: [place]                                      Prior Living Arrangements/Services Living arrangements for the past 2 months: [place] with:: Spouse Rhyatt, Muska (Spouse)   [phone] (Home Phone)) Patient language and need for interpreter reviewed:: Yes Do you feel safe going back to the place where you live?: Yes      Need for Family Participation in Patient Care: Yes (Comment) Care giver support system in place?: Yes (comment)   Criminal Activity/Legal Involvement Pertinent to Current Situation/Hospitalization: No - Comment as  needed  Activities of Daily Living      Permission Sought/Granted   Permission granted to share information with : Yes, Verbal Permission Granted  Share Information with NAME: Aidenn, Skellenger (Spouse)   [phone] (Home Phone)           Emotional Assessment Appearance:: Appears stated age Attitude/Demeanor/Rapport: Engaged Affect (typically observed): Stable Orientation: : Oriented to Self, Oriented to Place, Oriented to Situation Alcohol / Substance Use: Not Applicable Psych Involvement: No (comment)  Admission diagnosis:  Acute on chronic respiratory failure with hypoxemia (HCC) [J96.21] Patient Active Problem List   Diagnosis Date Noted   Acute on chronic respiratory failure with hypoxemia ([place]) 04/17/2021   CKD (chronic kidney disease) stage 4, GFR 15-29 ml/min (HCC)    ILD (interstitial lung disease) ([place])    COPD with acute exacerbation (HCC)    Pounding heartbeat 03/09/2021   Bruising 11/29/2020   Cough 11/29/2020   Unstable angina ([place]) 08/19/2020   Pulmonary hypertension, unspecified ([place]) 08/19/2020   Tremor 07/29/2020   Hypoxia 03/21/2020   Left leg cellulitis 09/16/2019   Asthma 03/31/2019   Renal artery stenosis ([place]) 02/24/2019   Advance care planning 12/26/2018   Hypothyroidism 12/26/2018   HLD (hyperlipidemia) 12/26/2018   Coronary artery disease involving native coronary artery of native heart with angina pectoris ([place]) 10/09/2018   Pulmonary hypertension, primary ([place]) 09/26/2018   CHF (congestive heart failure) ([place]) 09/20/2018  Knee pain 03/26/2018   Encounter for chronic pain management 12/23/2017   Healthcare maintenance 09/21/2017   Skin lesion 06/15/2017   Acute on chronic renal failure ([place]) 01/15/2017   Coronary stent occlusion: Occluded circumflex stent 01/13/2017   Accelerated hypertension with diastolic congestive heart failure, NYHA class 3 ([place]); elevated LVEDP on chronic catheterization. 01/13/2017   NSTEMI (non-ST elevated  myocardial infarction) ([place]) 01/11/2017   Blood in urine 12/08/2016   Lymphedema 10/23/2016   Low back pain with right-sided sciatica 04/03/2014   Muscle spasm 04/24/2013   Chronic edema 11/13/2012   Leg edema, right 09/29/2012   SK (seborrheic keratosis) 09/29/2012   Acute on chronic diastolic CHF (congestive heart failure) ([place]) 12/15/2011   Sebaceous cyst 11/28/2011   Morbid obesity ([place]) 07/27/2011   Hyperlipidemia with target low density lipoprotein (LDL) cholesterol less than 70 mg/dL 04/26/2011   Adrenal adenoma 03/02/2011   Back pain 12/06/2010   Kidney stones 12/06/2010   Essential hypertension, benign 10/28/2010   PULMONARY FIBROSIS 09/15/2010   CAD (coronary artery disease) 09/01/2010   Type 2 diabetes mellitus with renal complication ([place]) 16/04/9603   METABOLIC SYNDROME X 54/03/8118   OSA (obstructive sleep apnea) 08/31/2010   PCP:  [name], MD Pharmacy:   [facility] [address], [place] [address] Phone: [phone] Fax: [phone]  [facility] [place], [place] [address] Phone: [phone] Fax: [phone]  [facility] ([facility]) - [place], [place] [address] Phone: [phone] Fax: [phone]     Social Determinants of Health (SDOH) Interventions    Readmission Risk Interventions Readmission Risk Prevention Plan 09/20/2018  Transportation Screening Complete  PCP or Specialist Appt within 3-5 Days Complete  HRI or [place] Patient refused  Social Work Consult for [place] Planning/Counseling Patient refused  Palliative Care Screening Not Applicable  Medication Review Press photographer) Complete  Some recent data might be hidden

## 2021-04-20 ENCOUNTER — Other Ambulatory Visit: Payer: Self-pay

## 2021-04-20 DIAGNOSIS — J441 Chronic obstructive pulmonary disease with (acute) exacerbation: Secondary | ICD-10-CM | POA: Diagnosis not present

## 2021-04-20 DIAGNOSIS — I214 Non-ST elevation (NSTEMI) myocardial infarction: Secondary | ICD-10-CM | POA: Diagnosis not present

## 2021-04-20 DIAGNOSIS — J9621 Acute and chronic respiratory failure with hypoxia: Secondary | ICD-10-CM | POA: Diagnosis not present

## 2021-04-20 LAB — BASIC METABOLIC PANEL
Anion gap: 7 (ref 5–15)
BUN: 67 mg/dL — ABNORMAL HIGH (ref 8–23)
CO2: 28 mmol/L (ref 22–32)
Calcium: 8.4 mg/dL — ABNORMAL LOW (ref 8.9–10.3)
Chloride: 102 mmol/L (ref 98–111)
Creatinine, Ser: 2.68 mg/dL — ABNORMAL HIGH (ref 0.61–1.24)
GFR, Estimated: 24 mL/min — ABNORMAL LOW (ref >60)
Glucose, Bld: 287 mg/dL — ABNORMAL HIGH (ref 70–99)
Potassium: 3.7 mmol/L (ref 3.5–5.1)
Sodium: 137 mmol/L (ref 135–145)

## 2021-04-20 LAB — GLUCOSE, CAPILLARY
Glucose-Capillary: 237 mg/dL — ABNORMAL HIGH (ref 70–99)
Glucose-Capillary: 349 mg/dL — ABNORMAL HIGH (ref 70–99)
Glucose-Capillary: 425 mg/dL — ABNORMAL HIGH (ref 70–99)
Glucose-Capillary: 390 mg/dL — ABNORMAL HIGH (ref 70–99)

## 2021-04-20 LAB — CBC
HCT: 35.1 % — ABNORMAL LOW (ref 39.0–52.0)
Hemoglobin: 11.1 g/dL — ABNORMAL LOW (ref 13.0–17.0)
MCH: 27.8 pg (ref 26.0–34.0)
MCHC: 31.6 g/dL (ref 30.0–36.0)
MCV: 87.8 fL (ref 80.0–100.0)
Platelets: 192 10*3/uL (ref 150–400)
RBC: 4 MIL/uL — ABNORMAL LOW (ref 4.22–5.81)
RDW: 15.3 % (ref 11.5–15.5)
WBC: 11.2 10*3/uL — ABNORMAL HIGH (ref 4.0–10.5)
nRBC: 0 % (ref 0.0–0.2)

## 2021-04-20 LAB — HEPARIN LEVEL (UNFRACTIONATED): Heparin Unfractionated: 0.59 IU/mL (ref 0.30–0.70)

## 2021-04-20 MED ORDER — ENSURE MAX PROTEIN PO LIQD
11.0000 [oz_av] | Freq: Two times a day (BID) | ORAL | Status: DC
  Administered 2021-04-20 – 2021-04-30 (×18): 11 [oz_av] via ORAL
  Filled 2021-04-20: qty 330

## 2021-04-20 MED ORDER — ADULT MULTIVITAMIN W/MINERALS CH
1.0000 | ORAL_TABLET | Freq: Every day | ORAL | Status: DC
  Administered 2021-04-21 – 2021-04-30 (×10): 1 via ORAL
  Filled 2021-04-20 (×10): qty 1

## 2021-04-20 MED ORDER — CLONIDINE HCL 0.1 MG PO TABS
0.3000 mg | ORAL_TABLET | Freq: Three times a day (TID) | ORAL | Status: DC
  Administered 2021-04-20 – 2021-04-30 (×31): 0.3 mg via ORAL
  Filled 2021-04-20 (×31): qty 3

## 2021-04-20 NOTE — Progress Notes (Signed)
Initial Nutrition Assessment  DOCUMENTATION CODES:   Morbid obesity  INTERVENTION:   Ensure Max protein supplement BID, each supplement provides 150kcal and 30g of protein.  MVI po daily   Low sodium diet education  NUTRITION DIAGNOSIS:   Inadequate oral intake related to acute illness as evidenced by per patient/family report.  GOAL:   Patient will meet greater than or equal to 90% of their needs  MONITOR:   PO intake, Supplement acceptance, Labs, Weight trends, Skin, I & O's  REASON FOR ASSESSMENT:   Consult Assessment of nutrition requirement/status  ASSESSMENT:   75 y/o male with h/o DM, OSA, CAD, HTN, CHF, pulmonary hypertension, CKD, ILD and COPD who is admitted with NSTEMI and CHF.  Met with pt in room today. Pt reports good appetite and oral intake at baseline. Pt reports that his oral intake in hospital is fair; mainly because he does not like the hospital food. Pt ate 50% of his breakfast today and 100% of his lunch. RD discussed with pt the importance of adequate nutritional intake needed to preserve lean muscle. Pt is willing to drink strawberry or chocolate Ensure Max in hospital. RD will add supplements to help pt meet his estimated needs. Per chart, pt appears weight stable at baseline. RD provided pt with low sodium diet education today.   RD provided "Low Sodium Nutrition Therapy" handout from the Academy of Nutrition and Dietetics. Reviewed patient's dietary recall. Provided examples on ways to decrease sodium intake in diet. Discouraged intake of processed foods and use of salt shaker. Encouraged fresh fruits and vegetables as well as whole grain sources of carbohydrates to maximize fiber intake.   RD discussed why it is important for patient to adhere to diet recommendations, and emphasized the role of fluids, foods to avoid, and importance of weighing self daily. Teach back method used.  Medications reviewed and include: aspirin, calcitriol, plavix,  lasix, insulin, synthroid, protonix, Kcl, prednisone, heparin   Labs reviewed: K 3.7 wnl, BUN 67(H), creat 2.68(H) Wbc- 11.2(H) Cbgs- 237, 349 x 24 hrs AIC 6.7(H)- 8/22  NUTRITION - FOCUSED PHYSICAL EXAM:  Flowsheet Row Most Recent Value  Orbital Region No depletion  Upper Arm Region No depletion  Thoracic and Lumbar Region No depletion  Buccal Region No depletion  Temple Region No depletion  Clavicle Bone Region No depletion  Clavicle and Acromion Bone Region No depletion  Scapular Bone Region No depletion  Dorsal Hand No depletion  Patellar Region No depletion  Anterior Thigh Region No depletion  Posterior Calf Region Unable to assess  Edema (RD Assessment) Moderate  Hair Reviewed  Eyes Reviewed  Mouth Reviewed  Skin Reviewed  Nails Reviewed   Diet Order:   Diet Order             Diet heart healthy/carb modified Room service appropriate? Yes; Fluid consistency: Thin  Diet effective now                  EDUCATION NEEDS:   Education needs have been addressed  Skin:  Skin Assessment: Reviewed RN Assessment (ecchymosis)  Last BM:  10/18- TYPE 7  Height:   Ht Readings from Last 1 Encounters:  04/19/21 _0  (1.651 m)    Weight:   Wt Readings from Last 1 Encounters:  04/20/21 119.8 kg    Ideal Body Weight:  61.8 kg  BMI:  Body mass index is 43.95 kg/m.  Estimated Nutritional Needs:   Kcal:  2200-2500kcal/day  Protein:  110-125g/day  Fluid:  1.6-1.9L/day  Koleen Distance MS, RD, LDN Please refer to [facility] for RD and/or RD on-call/weekend/after hours pager

## 2021-04-20 NOTE — Plan of Care (Signed)
Problem: Education: Goal: Knowledge of General Education information will improve Description: Including pain rating scale, medication(s)/side effects and non-pharmacologic comfort measures 04/20/2021 1337 by Cristela Blue, RN Outcome: Progressing 04/20/2021 1337 by Cristela Blue, RN Outcome: Progressing   Problem: Health Behavior/Discharge Planning: Goal: Ability to manage health-related needs will improve 04/20/2021 1337 by Cristela Blue, RN Outcome: Progressing 04/20/2021 1337 by Cristela Blue, RN Outcome: Progressing   Problem: Clinical Measurements: Goal: Ability to maintain clinical measurements within normal limits will improve 04/20/2021 1337 by Cristela Blue, RN Outcome: Progressing 04/20/2021 1337 by Cristela Blue, RN Outcome: Progressing Goal: Will remain free from infection 04/20/2021 1337 by Cristela Blue, RN Outcome: Progressing 04/20/2021 1337 by Cristela Blue, RN Outcome: Progressing Goal: Diagnostic test results will improve 04/20/2021 1337 by Cristela Blue, RN Outcome: Progressing 04/20/2021 1337 by Cristela Blue, RN Outcome: Progressing Goal: Respiratory complications will improve 04/20/2021 1337 by Cristela Blue, RN Outcome: Progressing 04/20/2021 1337 by Cristela Blue, RN Outcome: Progressing Goal: Cardiovascular complication will be avoided 04/20/2021 1337 by Cristela Blue, RN Outcome: Progressing 04/20/2021 1337 by Cristela Blue, RN Outcome: Progressing   Problem: Activity: Goal: Risk for activity intolerance will decrease 04/20/2021 1337 by Cristela Blue, RN Outcome: Progressing 04/20/2021 1337 by Cristela Blue, RN Outcome: Progressing   Problem: Nutrition: Goal: Adequate nutrition will be maintained 04/20/2021 1337 by Cristela Blue, RN Outcome: Progressing 04/20/2021 1337 by Cristela Blue, RN Outcome: Progressing   Problem: Coping: Goal: Level of anxiety will decrease 04/20/2021 1337 by Cristela Blue, RN Outcome:  Progressing 04/20/2021 1337 by Cristela Blue, RN Outcome: Progressing   Problem: Elimination: Goal: Will not experience complications related to bowel motility 04/20/2021 1337 by Cristela Blue, RN Outcome: Progressing 04/20/2021 1337 by Cristela Blue, RN Outcome: Progressing Goal: Will not experience complications related to urinary retention 04/20/2021 1337 by Cristela Blue, RN Outcome: Progressing 04/20/2021 1337 by Cristela Blue, RN Outcome: Progressing   Problem: Pain Managment: Goal: General experience of comfort will improve 04/20/2021 1337 by Cristela Blue, RN Outcome: Progressing 04/20/2021 1337 by Cristela Blue, RN Outcome: Progressing   Problem: Safety: Goal: Ability to remain free from injury will improve 04/20/2021 1337 by Cristela Blue, RN Outcome: Progressing 04/20/2021 1337 by Cristela Blue, RN Outcome: Progressing   Problem: Skin Integrity: Goal: Risk for impaired skin integrity will decrease 04/20/2021 1337 by Cristela Blue, RN Outcome: Progressing 04/20/2021 1337 by Cristela Blue, RN Outcome: Progressing   Problem: Education: Goal: Knowledge of General Education information will improve Description: Including pain rating scale, medication(s)/side effects and non-pharmacologic comfort measures 04/20/2021 1337 by Cristela Blue, RN Outcome: Progressing 04/20/2021 1337 by Cristela Blue, RN Outcome: Progressing   Problem: Health Behavior/Discharge Planning: Goal: Ability to manage health-related needs will improve 04/20/2021 1337 by Cristela Blue, RN Outcome: Progressing 04/20/2021 1337 by Cristela Blue, RN Outcome: Progressing   Problem: Clinical Measurements: Goal: Ability to maintain clinical measurements within normal limits will improve 04/20/2021 1337 by Cristela Blue, RN Outcome: Progressing 04/20/2021 1337 by Cristela Blue, RN Outcome: Progressing Goal: Will remain free from infection 04/20/2021 1337 by Cristela Blue,  RN Outcome: Progressing 04/20/2021 1337 by Cristela Blue, RN Outcome: Progressing Goal: Diagnostic test results will improve 04/20/2021 1337 by Cristela Blue, RN Outcome: Progressing 04/20/2021 1337 by Cristela Blue, RN Outcome: Progressing Goal: Respiratory complications will improve 04/20/2021 1337 by Cristela Blue, RN Outcome: Progressing 04/20/2021 1337 by Cristela Blue, RN Outcome: Progressing Goal: Cardiovascular complication will be avoided 04/20/2021 1337 by Cristela Blue, RN Outcome: Progressing 04/20/2021 1337 by Cristela Blue, RN Outcome: Progressing   Problem: Activity: Goal:  Risk for activity intolerance will decrease 04/20/2021 1337 by Cristela Blue, RN Outcome: Progressing 04/20/2021 1337 by Cristela Blue, RN Outcome: Progressing   Problem: Nutrition: Goal: Adequate nutrition will be maintained 04/20/2021 1337 by Cristela Blue, RN Outcome: Progressing 04/20/2021 1337 by Cristela Blue, RN Outcome: Progressing   Problem: Coping: Goal: Level of anxiety will decrease 04/20/2021 1337 by Cristela Blue, RN Outcome: Progressing 04/20/2021 1337 by Cristela Blue, RN Outcome: Progressing   Problem: Elimination: Goal: Will not experience complications related to bowel motility 04/20/2021 1337 by Cristela Blue, RN Outcome: Progressing 04/20/2021 1337 by Cristela Blue, RN Outcome: Progressing Goal: Will not experience complications related to urinary retention 04/20/2021 1337 by Cristela Blue, RN Outcome: Progressing 04/20/2021 1337 by Cristela Blue, RN Outcome: Progressing   Problem: Pain Managment: Goal: General experience of comfort will improve 04/20/2021 1337 by Cristela Blue, RN Outcome: Progressing 04/20/2021 1337 by Cristela Blue, RN Outcome: Progressing   Problem: Safety: Goal: Ability to remain free from injury will improve 04/20/2021 1337 by Cristela Blue, RN Outcome: Progressing 04/20/2021 1337 by Cristela Blue,  RN Outcome: Progressing   Problem: Skin Integrity: Goal: Risk for impaired skin integrity will decrease 04/20/2021 1337 by Cristela Blue, RN Outcome: Progressing 04/20/2021 1337 by Cristela Blue, RN Outcome: Progressing   Problem: Education: Goal: Understanding of cardiac disease, CV risk reduction, and recovery process will improve 04/20/2021 1337 by Cristela Blue, RN Outcome: Progressing 04/20/2021 1337 by Cristela Blue, RN Outcome: Progressing Goal: Individualized Educational Video(s) 04/20/2021 1337 by Cristela Blue, RN Outcome: Progressing 04/20/2021 1337 by Cristela Blue, RN Outcome: Progressing   Problem: Activity: Goal: Ability to tolerate increased activity will improve 04/20/2021 1337 by Cristela Blue, RN Outcome: Progressing 04/20/2021 1337 by Cristela Blue, RN Outcome: Progressing   Problem: Cardiac: Goal: Ability to achieve and maintain adequate cardiovascular perfusion will improve 04/20/2021 1337 by Cristela Blue, RN Outcome: Progressing 04/20/2021 1337 by Cristela Blue, RN Outcome: Progressing   Problem: Health Behavior/Discharge Planning: Goal: Ability to safely manage health-related needs after discharge will improve 04/20/2021 1337 by Cristela Blue, RN Outcome: Progressing 04/20/2021 1337 by Cristela Blue, RN Outcome: Progressing

## 2021-04-20 NOTE — Progress Notes (Signed)
PROGRESS NOTE    Stephen [name]  WUX:[id number] DOB: 06-08-1946 DOA: 04/17/2021 PCP: [name], MD    Assessment & Plan:   Active Problems:   Type 2 diabetes mellitus with renal complication (HCC)   OSA (obstructive sleep apnea)   CAD (coronary artery disease)   Essential hypertension, benign   Acute on chronic heart failure with preserved ejection fraction (HFpEF) (HCC)   Pulmonary hypertension, primary (HCC)   Acute on chronic respiratory failure with hypoxemia (HCC)   CKD (chronic kidney disease) stage 4, GFR 15-29 ml/min (HCC)   ILD (interstitial lung disease) (HCC)   COPD with acute exacerbation ([place])   NSTEMI: w/ hx of CAD. EF 25-36%, grade I diastolic dysfunction. Continue on carvedilol, imdur, plavix, statin, zetia. Continue on IV heparin as per cardio. No cardiac cath right now secondary to CKD and unable to lay flat due to orthopnea as per cardio. Cardio following and recs apprec   Acute on chronic hypoxic respiratory failure: presenting in acute respiratory distress, speaking in short sentences, requiring BiPAP. Etiology likely secondary to COPD, CHF exacerbation superimposed on chronic pulmon conditions, ILD, PAH. Continue on steroids, lasix, and bronchodilators   Acute on chronic diastolic CHF: continue on lasix. Monitor I/Os and daily weights. Continue on coreg, imdur, hydralazine  HTN emergency: resolved but still w/ HTN. Continue on hydralazine, coreg, imdur   COPD exacerbation: continue on steroids, bronchodilators & encourage incentive spirometry    Pulmon disease: w/ hx of ILD, pulmonary HTN. Continue to hold sildanefil   CKDIV: baseline Cr 2.6. Cr is at baseline. Will continue to monitor   DM2: likely poorly controlled. Continue on glargine, SSI w/ accuchecks    DVT prophylaxis: heparin  Code Status: full  Family Communication:  Disposition Plan: likely d/c back home   Level of care: Progressive Cardiac  Status is: Inpatient  Remains  inpatient appropriate because: secondary respiratory status, severity of illness     Consultants:  Cardio   Procedures:   Antimicrobials:    Subjective: Pt c/o shortness of breath   Objective: Vitals:   04/19/21 2338 04/20/21 0100 04/20/21 0143 04/20/21 0447  BP: (!) 187/78 (!) 167/67  (!) 160/64  Pulse: 72     Resp: 18   20  Temp: 99.1 F (37.3 C)   98.1 F (36.7 C)  TempSrc: Oral   Oral  SpO2: 98%   97%  Weight:   119.8 kg   Height: 5\' 5"  (1.651 m)       Intake/Output Summary (Last 24 hours) at 04/20/2021 0750 Last data filed at 04/20/2021 0700 Gross per 24 hour  Intake 116.26 ml  Output 3625 ml  Net -3508.74 ml   Filed Weights   04/18/21 0604 04/20/21 0143  Weight: 120.7 kg 119.8 kg    Examination:  General exam: Appears calm and comfortable  Respiratory system: diminished breath sounds b/l  Cardiovascular system: S1 & S2 +. No rubs, gallops or clicks. B/l LE 2+ pitting edema  Gastrointestinal system: Abdomen is nondistended, soft and nontender. Normal bowel sounds heard. Central nervous system: Alert and oriented. Moves all extremities  Psychiatry: Judgement and insight appear normal. Flat mood and affect     Data Reviewed: I have personally reviewed following labs and imaging studies  CBC: Recent Labs  Lab 04/17/21 2101 04/19/21 0259 04/19/21 1317 04/20/21 0541  WBC 6.0 11.0* 10.0 11.2*  NEUTROABS 4.8  --   --   --   HGB 9.9* 13.0 12.0* 11.1*  HCT 31.4*  39.4 37.5* 35.1*  MCV 92.4 88.1 87.8 87.8  PLT 153 191 205 283   Basic Metabolic Panel: Recent Labs  Lab 04/17/21 2101 04/18/21 0505 04/19/21 0259 04/19/21 1416 04/20/21 0541  NA 141 140 137  --  137  K 4.0 3.8 3.9  --  3.7  CL 105 106 101  --  102  CO2 27 26 25   --  28  GLUCOSE 216* 283* 320*  --  287*  BUN 36* 35* 47*  --  67*  CREATININE 2.66* 2.74* 2.80* 2.89* 2.68*  CALCIUM 8.3* 8.2* 8.5*  --  8.4*   GFR: Estimated Creatinine Clearance: 29.4 mL/min (A) (by C-G formula  based on SCr of 2.68 mg/dL (H)). Liver Function Tests: Recent Labs  Lab 04/17/21 2101  AST 22  ALT 17  ALKPHOS 84  BILITOT 0.7  PROT 6.8  ALBUMIN 2.9*   No results for input(s): LIPASE, AMYLASE in the last 168 hours. No results for input(s): AMMONIA in the last 168 hours. Coagulation Profile: Recent Labs  Lab 04/18/21 0623  INR 1.1   Cardiac Enzymes: No results for input(s): CKTOTAL, CKMB, CKMBINDEX, TROPONINI in the last 168 hours. BNP (last 3 results) No results for input(s): PROBNP in the last 8760 hours. HbA1C: No results for input(s): HGBA1C in the last 72 hours. CBG: Recent Labs  Lab 04/19/21 0818 04/19/21 1128 04/19/21 1716 04/19/21 2212 04/19/21 2343  GLUCAP 395* 335* 315* 225* 211*   Lipid Profile: No results for input(s): CHOL, HDL, LDLCALC, TRIG, CHOLHDL, LDLDIRECT in the last 72 hours. Thyroid Function Tests: No results for input(s): TSH, T4TOTAL, FREET4, T3FREE, THYROIDAB in the last 72 hours. Anemia Panel: No results for input(s): VITAMINB12, FOLATE, FERRITIN, TIBC, IRON, RETICCTPCT in the last 72 hours. Sepsis Labs: Recent Labs  Lab 04/17/21 2101 04/18/21 0658  LATICACIDVEN 1.2 0.9    Recent Results (from the past 240 hour(s))  Resp Panel by RT-PCR (Flu A&B, Covid) Nasopharyngeal Swab     Status: None   Collection Time: 04/17/21  8:58 PM   Specimen: Nasopharyngeal Swab; Nasopharyngeal(NP) swabs in vial transport medium  Result Value Ref Range Status   SARS Coronavirus 2 by RT PCR NEGATIVE NEGATIVE Final    Comment: (NOTE) SARS-CoV-2 target nucleic acids are NOT DETECTED.  The SARS-CoV-2 RNA is generally detectable in upper respiratory specimens during the acute phase of infection. The lowest concentration of SARS-CoV-2 viral copies this assay can detect is 138 copies/mL. A negative result does not preclude SARS-Cov-2 infection and should not be used as the sole basis for treatment or other patient management decisions. A negative result  may occur with  improper specimen collection/handling, submission of specimen other than nasopharyngeal swab, presence of viral mutation(s) within the areas targeted by this assay, and inadequate number of viral copies(<138 copies/mL). A negative result must be combined with clinical observations, patient history, and epidemiological information. The expected result is Negative.  Fact Sheet for Patients:  EntrepreneurPulse.com.au  Fact Sheet for Healthcare Providers:  IncredibleEmployment.be  This test is no t yet approved or cleared by the [place] FDA and  has been authorized for detection and/or diagnosis of SARS-CoV-2 by FDA under an Emergency Use Authorization (EUA). This EUA will remain  in effect (meaning this test can be used) for the duration of the COVID-19 declaration under Section 564(b)(1) of the Act, 21 U.S.C.section 360bbb-3(b)(1), unless the authorization is terminated  or revoked sooner.       Influenza A by PCR NEGATIVE NEGATIVE Final  Influenza B by PCR NEGATIVE NEGATIVE Final    Comment: (NOTE) The Xpert Xpress SARS-CoV-2/FLU/RSV plus assay is intended as an aid in the diagnosis of influenza from Nasopharyngeal swab specimens and should not be used as a sole basis for treatment. Nasal washings and aspirates are unacceptable for Xpert Xpress SARS-CoV-2/FLU/RSV testing.  Fact Sheet for Patients: EntrepreneurPulse.com.au  Fact Sheet for Healthcare Providers: IncredibleEmployment.be  This test is not yet approved or cleared by the [place] FDA and has been authorized for detection and/or diagnosis of SARS-CoV-2 by FDA under an Emergency Use Authorization (EUA). This EUA will remain in effect (meaning this test can be used) for the duration of the COVID-19 declaration under Section 564(b)(1) of the Act, 21 U.S.C. section 360bbb-3(b)(1), unless the authorization is terminated  or revoked.  Performed at [facility], [address], [place], [place] [address]          Radiology Studies: DG Chest [place] 1 View  Result Date: 04/19/2021 CLINICAL DATA:  Shortness of breath, chest pain, history of CHF EXAM: PORTABLE CHEST 1 VIEW COMPARISON:  Chest radiograph 04/17/2021 FINDINGS: The heart is enlarged, unchanged. The mediastinal contours are stable. Increased vascular markings are again seen throughout both lungs, slightly improved in the interim. There is no new or worsening focal airspace disease. There is no pleural effusion. There is no pneumothorax. There is no acute osseous abnormality. IMPRESSION: Increased interstitial markings in both lungs are slightly improved compared to the prior study, suggesting improving pulmonary edema and/or infection Electronically Signed   By: Valetta Mole M.D.   On: 04/19/2021 09:10   ECHOCARDIOGRAM COMPLETE  Result Date: 04/18/2021    ECHOCARDIOGRAM REPORT   Patient Name:   Stephen [name] Date of Exam: 04/18/2021 Medical Rec #:  [id number]        Height:       65.0 in Accession #:    [id number]       Weight:       266.0 lb Date of Birth:  June 13, 1946        BSA:          2.233 m Patient Age:    39 years         BP:           190/74 mmHg Patient Gender: M                HR:           83 bpm. Exam Location:  [facility] Procedure: 2D Echo, Color Doppler, Cardiac Doppler and Intracardiac            Opacification Agent Indications:     I50.31 congestive heart failure-Acute Diastolic  History:         Patient has prior history of Echocardiogram examinations, most                  recent 08/06/2020. CAD, CKD; Risk Factors:Hypertension, Diabetes,                  Dyslipidemia and Sleep Apnea.  Sonographer:     Charmayne Sheer Referring Phys:  [id number] Athena Masse Diagnosing Phys: Nelva Bush MD  Sonographer Comments: Technically difficult study due to poor echo windows. Image acquisition challenging due to patient body habitus. IMPRESSIONS  1.  Left ventricular ejection fraction, by estimation, is 60 to 65%. The left ventricle has normal function. Left ventricular endocardial border not optimally defined to evaluate regional wall motion. The left ventricular internal cavity  size was mildly dilated. There is mild left ventricular hypertrophy. Left ventricular diastolic parameters are consistent with Grade I diastolic dysfunction (impaired relaxation). Elevated left atrial pressure.  2. Right ventricular systolic function is normal. The right ventricular size is not well visualized.  3. The mitral valve is abnormal. Trivial mitral valve regurgitation. Mild mitral stenosis. The mean mitral valve gradient is 5.0 mmHg.  4. Tricuspid valve regurgitation not well assessed.  5. The aortic valve has an indeterminant number of cusps. There is mild calcification of the aortic valve. There is moderate thickening of the aortic valve. Aortic valve regurgitation is trivial. Mild to moderate aortic valve sclerosis/calcification is present, without any evidence of aortic stenosis. FINDINGS  Left Ventricle: Left ventricular ejection fraction, by estimation, is 60 to 65%. The left ventricle has normal function. Left ventricular endocardial border not optimally defined to evaluate regional wall motion. Definity contrast agent was given IV to delineate the left ventricular endocardial borders. The left ventricular internal cavity size was mildly dilated. There is mild left ventricular hypertrophy. Left ventricular diastolic parameters are consistent with Grade I diastolic dysfunction (impaired relaxation). Elevated left atrial pressure. Right Ventricle: The right ventricular size is not well visualized. Right vetricular wall thickness was not well visualized. Right ventricular systolic function is normal. Left Atrium: Left atrial size was not well visualized. Right Atrium: Right atrial size was not well visualized. Pericardium: The pericardium was not well visualized. Mitral  Valve: The mitral valve is abnormal. There is mild calcification of the mitral valve leaflet(s). Trivial mitral valve regurgitation. Mild mitral valve stenosis. MV peak gradient, 7.3 mmHg. The mean mitral valve gradient is 5.0 mmHg. Tricuspid Valve: The tricuspid valve is not well visualized. Tricuspid valve regurgitation not well assessed. Aortic Valve: The aortic valve has an indeterminant number of cusps. There is mild calcification of the aortic valve. There is moderate thickening of the aortic valve. Aortic valve regurgitation is trivial. Mild to moderate aortic valve sclerosis/calcification is present, without any evidence of aortic stenosis. Aortic valve mean gradient measures 6.0 mmHg. Aortic valve peak gradient measures 11.0 mmHg. Aortic valve area, by VTI measures 3.42 cm. Pulmonic Valve: The pulmonic valve was not well visualized. Pulmonic valve regurgitation is not visualized. No evidence of pulmonic stenosis. Aorta: The aortic root is normal in size and structure. Pulmonary Artery: The pulmonary artery is of normal size. IAS/Shunts: The interatrial septum was not well visualized.  LEFT VENTRICLE PLAX 2D LVIDd:         5.80 cm   Diastology LVIDs:         3.60 cm   LV e' medial:    5.11 cm/s LV PW:         1.10 cm   LV E/e' medial:  19.8 LV IVS:        1.20 cm   LV e' lateral:   5.11 cm/s LVOT diam:     2.60 cm   LV E/e' lateral: 19.8 LV SV:         97 LV SV Index:   44 LVOT Area:     5.31 cm  LEFT ATRIUM         Index LA diam:    4.50 cm 2.02 cm/m  AORTIC VALVE                     PULMONIC VALVE AV Area (Vmax):    2.84 cm      PV Vmax:       1.22 m/s  AV Area (Vmean):   2.94 cm      PV Vmean:      89.700 cm/s AV Area (VTI):     3.42 cm      PV VTI:        0.250 m AV Vmax:           166.00 cm/s   PV Peak grad:  6.0 mmHg AV Vmean:          110.000 cm/s  PV Mean grad:  3.0 mmHg AV VTI:            0.284 m AV Peak Grad:      11.0 mmHg AV Mean Grad:      6.0 mmHg LVOT Vmax:         88.80 cm/s LVOT Vmean:         60.900 cm/s LVOT VTI:          0.183 m LVOT/AV VTI ratio: 0.64  AORTA Ao Root diam: 3.50 cm MITRAL VALVE MV Area (PHT): 7.37 cm     SHUNTS MV Area VTI:   3.81 cm     Systemic VTI:  0.18 m MV Peak grad:  7.3 mmHg     Systemic Diam: 2.60 cm MV Mean grad:  5.0 mmHg MV Vmax:       1.35 m/s MV Vmean:      107.0 cm/s MV Decel Time: 103 msec MV E velocity: 101.00 cm/s MV A velocity: 118.00 cm/s MV E/A ratio:  0.86 Christopher End MD Electronically signed by Nelva Bush MD Signature Date/Time: 04/18/2021/12:46:02 PM    Final         Scheduled Meds:  aspirin EC  81 mg Oral Daily   atorvastatin  80 mg Oral QHS   calcitRIOL  0.25 mcg Oral Daily   carvedilol  12.5 mg Oral BID WC   cloNIDine  0.3 mg Oral TID   clopidogrel  75 mg Oral Daily   dorzolamide-timolol  1 drop Both Eyes BID   ezetimibe  10 mg Oral Daily   fluticasone furoate-vilanterol  1 puff Inhalation Daily   furosemide  40 mg Intravenous BID   gabapentin  1,200 mg Oral QHS   gabapentin  800 mg Oral Daily   hydrALAZINE  100 mg Oral TID   insulin aspart  0-15 Units Subcutaneous TID WC   insulin aspart  0-5 Units Subcutaneous QHS   insulin aspart  4 Units Subcutaneous TID WC   insulin glargine-yfgn  15 Units Subcutaneous QHS   ipratropium-albuterol  3 mL Nebulization Q6H   isosorbide mononitrate  120 mg Oral Daily   [START ON 04/24/2021] levothyroxine  25 mcg Oral Weekly   levothyroxine  50 mcg Oral Once per day on Mon Tue Wed Thu Fri Sat   montelukast  10 mg Oral Daily   pantoprazole  40 mg Oral Daily   potassium chloride  20 mEq Oral Daily   predniSONE  40 mg Oral Q breakfast   sodium chloride flush  3 mL Intravenous Q12H   tamsulosin  0.4 mg Oral QHS   Continuous Infusions:  sodium chloride     heparin 1,550 Units/hr (04/20/21 0457)     LOS: 3 days    Time spent: 32 mins     Wyvonnia Dusky, MD [facility] Pager [phone]  If 7PM-7AM, please contact night-coverage 04/20/2021, 7:50 AM

## 2021-04-20 NOTE — Care Management Important Message (Signed)
Important Message  Patient Details  Name: Stephen [name] MRN: [id number] Date of Birth: 04/07/1946   Medicare Important Message Given:  Yes     Juliann Pulse A Essie Gehret 04/20/2021, 3:11 PM

## 2021-04-20 NOTE — Plan of Care (Signed)
  Problem: Clinical Measurements: Goal: Cardiovascular complication will be avoided Outcome: Progressing   Problem: Pain Managment: Goal: General experience of comfort will improve Outcome: Progressing   Problem: Safety: Goal: Ability to remain free from injury will improve Outcome: Progressing   Problem: Education: Goal: Knowledge of General Education information will improve Description: Including pain rating scale, medication(s)/side effects and non-pharmacologic comfort measures Outcome: Progressing

## 2021-04-20 NOTE — Progress Notes (Signed)
[place] for Heparin Indication: chest pain/ACS  Allergies  Allergen Reactions   Calcium Channel Blockers     Would avoid if possible due to h/o peripheral edema    Patient Measurements: Height: 5\' 5"  (165.1 cm) Weight: 119.8 kg (264 lb 1.8 oz) IBW/kg (Calculated) : 61.5 Heparin Dosing Weight: 90 kg   Vital Signs: Temp: 98.1 F (36.7 C) (10/19 0447) Temp Source: Oral (10/19 0447) BP: 160/64 (10/19 0447) Pulse Rate: 72 (10/18 2338)  Labs: Recent Labs    04/18/21 0505 04/18/21 8366 04/18/21 0658 04/18/21 1240 04/18/21 1441 04/19/21 0259 04/19/21 1130 04/19/21 1317 04/19/21 1416 04/20/21 0541  HGB  --   --   --   --   --  13.0  --  12.0*  --  11.1*  HCT  --   --   --   --   --  39.4  --  37.5*  --  35.1*  PLT  --   --   --   --   --  191  --  205  --  192  APTT  --  24  --   --   --   --   --   --   --   --   LABPROT  --  14.2  --   --   --   --   --   --   --   --   INR  --  1.1  --   --   --   --   --   --   --   --   HEPARINUNFRC  --   --   --   --    < > 0.55 0.56  --   --  0.59  CREATININE 2.74*  --   --   --   --  2.80*  --   --  2.89* 2.68*  TROPONINIHS 2,129*  --  1,997* 1,842*  --   --   --   --   --   --    < > = values in this interval not displayed.    Estimated Creatinine Clearance: 29.4 mL/min (A) (by C-G formula based on SCr of 2.68 mg/dL (H)).   Medical History: Past Medical History:  Diagnosis Date   Adrenal gland anomaly    Arthritis    CAD (coronary artery disease)    a. 04/2006 MI and PCI/stenting to mLCx & mRCA; b. 07/2009 Cath: patent LCX/RCA stents;  c. 12/2016 NSTEMI/Cath: LM 30, LAD 30p, 41m, 30d, D1/2/3 min irregs, LCX 118m ISR, OM1 min irregs, RCA 20p, 10 ISR, 23m, 50d, RPDA min irregs, RPAV 40-->Med Rx; d. 12/2017 MV: fixed lateral wall scar, mild anterior/anterior septal ischemia. EF 30-44%.   CHF (congestive heart failure) (HCC)    CKD (chronic kidney disease), stage III (HCC)    Colon polyps     DM2 (diabetes mellitus, type 2) (HCC)    insulin requiring   Dyslipidemia    GERD (gastroesophageal reflux disease)    History of echocardiogram    a. TTE 12/2016: EF 50-55%, mod concentric LVH, images inadequate for wall motion assessment, not technically sufficienct to allow for LV dias fxn, calcified mitral annulus    HTN (hypertension)    Kidney stones    Left Renal artery stenosis ([place])    a. 07/2013 s/p PTA/stenting ([place]); b. 07/2017 Renal Duplex: No significant RAS.   Myocardial infarction (HCC)    OSA (obstructive  sleep apnea)    Pulmonary fibrosis ([place])    a. 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   Recurrent UTI    Rotator cuff disorder    right   Skin cancer    head   Thyroid disorder    Venous insufficiency of both lower extremities    a. s/p laser treatment.    Medications:  Medications Prior to Admission  Medication Sig Dispense Refill Last Dose   acetaminophen (TYLENOL) 500 MG tablet Take 1,000 mg by mouth every 6 (six) hours as needed.   prn at prn   albuterol (VENTOLIN HFA) 108 (90 Base) MCG/ACT inhaler INHALE 2 PUFFS INTO THE LUNGS EVERY 6 HOURS AS NEEDED FOR WHEEZING OR SHORTNESS OF BREATH. DISPENSE 3 INHALERS 27 g 0 prn at prn   aspirin EC 81 MG tablet Take 1 tablet (81 mg total) by mouth daily. Swallow whole.   04/17/2021 at 0900   atorvastatin (LIPITOR) 80 MG tablet TAKE 1 TABLET AT BEDTIME 90 tablet 1 04/16/2021 at 2100   calcitRIOL (ROCALTROL) 0.25 MCG capsule Take 0.25 mcg by mouth daily.   04/17/2021 at 0900   carvedilol (COREG) 12.5 MG tablet TAKE 1 TABLET TWO TIMES DAILY WITH MEALS 180 tablet 1 04/17/2021 at 0900   cloNIDine (CATAPRES) 0.3 MG tablet TAKE 1 TABLET THREE TIMES DAILY 270 tablet 0 04/17/2021 at 1300   clopidogrel (PLAVIX) 75 MG tablet TAKE 1 TABLET EVERY DAY 90 tablet 2 04/17/2021 at 0900   dorzolamide-timolol (COSOPT) 22.3-6.8 MG/ML ophthalmic solution Place 1 drop into both eyes in the  morning and at bedtime.   04/17/2021 at 0900   ezetimibe (ZETIA) 10 MG tablet TAKE 1 TABLET EVERY DAY 90 tablet 2 04/17/2021 at 0900   fluticasone (FLONASE) 50 MCG/ACT nasal spray Place 2 sprays into both nostrils daily. 16 g 6 prn at prn   Fluticasone-Umeclidin-Vilant (TRELEGY ELLIPTA) 200-62.5-25 MCG/INH AEPB Inhale 1 puff into the lungs daily. 60 each 0 04/17/2021 at 0900   furosemide (LASIX) 80 MG tablet Take 160 mg by mouth daily.   04/17/2021 at 1300   gabapentin (NEURONTIN) 400 MG capsule TAKE 2 CAPSULES IN THE MORNING  AND TAKE 3 CAPSULES EVERY NIGHT 450 capsule 3 04/17/2021 at 0900   hydrALAZINE (APRESOLINE) 50 MG tablet Take 1 tablet (50 mg total) by mouth 3 (three) times daily. 270 tablet 3 04/17/2021 at 1300   insulin NPH-regular Human (70-30) 100 UNIT/ML injection Inject 40-60 Units into the skin as directed. Take 60 units in the morning and 40 units in the evening   04/17/2021 at 0900   isosorbide mononitrate (IMDUR) 60 MG 24 hr tablet TAKE 1 AND 1/2 TABLETS TWICE DAILY 270 tablet 0 04/17/2021 at 0900   levothyroxine (SYNTHROID) 25 MCG tablet take 2 of the 25 mcg tablets Monday through Saturday with 1 tablet on Sunday.  13 pills/week. 180 tablet 1 04/17/2021 at 0700   montelukast (SINGULAIR) 10 MG tablet Take 1 tablet by mouth once daily 30 tablet 0 04/17/2021 at 0900   nitroGLYCERIN (NITROSTAT) 0.4 MG SL tablet Place 1 tablet (0.4 mg total) under the tongue every 5 (five) minutes as needed. May repeat x3 25 tablet prn prn at prn   omeprazole (PRILOSEC) 20 MG capsule Take 20 mg by mouth daily before breakfast.   04/17/2021 at 0700   potassium chloride (KLOR-CON) 10 MEQ tablet Take 2 tablets (20 meq) by mouth once daily 180 tablet 3 04/17/2021 at 1300   primidone (MYSOLINE) 50  MG tablet Take 0.5 tablets (25 mg total) by mouth in the morning. For tremor 45 tablet 1 04/17/2021 at 0900   sildenafil (REVATIO) 20 MG tablet Take 1 tablet (20 mg total) by mouth 3 (three) times daily. 270 tablet  3 04/17/2021 at 1300   tamsulosin (FLOMAX) 0.4 MG CAPS capsule TAKE 1 CAPSULE AT BEDTIME 90 capsule 1 04/16/2021 at 2100   torsemide (DEMADEX) 20 MG tablet Take 2 tablets (40 mg) by mouth once daily 180 tablet 2 04/17/2021 at 0900   ACCU-CHEK AVIVA PLUS test strip USE AS INSTRUCTED TO CHECK BLOOD SUGAR 3 TIMES DAILY OR AS NEEDED 300 strip 1    ACCU-CHEK SOFTCLIX LANCETS lancets Use as instructed to check blood sugar three times daily and as needed.  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 100 each 5    Alcohol Swabs (B-D SINGLE USE SWABS REGULAR) PADS USE TO CLEANS AREA PRIOR TO CHECKING BLOOD SUGAR THREE TIMES DAILY AND AS NEEDED 400 each 3    Blood Glucose Monitoring Suppl (ACCU-CHEK AVIVA PLUS) w/Device KIT Use to check blood sugar three times daily and as needed.  Diagnosis: E11.22  Insulin-dependent 1 kit 0    diphenhydrAMINE (BENADRYL) 50 MG tablet Take 50 mg by mouth at bedtime as needed for allergies. (Patient not taking: Reported on 04/18/2021)   Not Taking   DROPLET INSULIN SYRINGE 30G X 5/16" 1 ML MISC USE AS DIRECTED TO INJECT INSULIN 200 each 2    HYDROcodone-acetaminophen (NORCO/VICODIN) 5-325 MG tablet Take 1 tablet by mouth every 6 (six) hours as needed for moderate pain. (Patient not taking: Reported on 04/18/2021) 20 tablet 0 Not Taking   Sennosides (EX-LAX) 15 MG CHEW Chew 15 mg by mouth 2 (two) times daily as needed (constipation). (Patient not taking: Reported on 04/18/2021)   Not Taking    Assessment: 75 year old male w/ PMH of moderate persistent asthma, interstitial lung disease, pulmonary hypertension, OSA with chronic respiratory failure on home O2 at 4 L, diastolic heart failure, CAD, DM, CKD IV who was admitted with ACS/NSTEMI.   Pharmacy was consulted for heparin dosing. No prior anticoagulation noted.   Date/time HL Comment, interpretation 10/17 1441 0.15 Subthera, at 1250 units/hr 10/18 0259 0.55 Therapeutic x 1 @ 1550 u/hr 10/18 1130 0.56 Therapeutic x2 10/19   0629     0.59     Therapeutic  Goal of Therapy:  Heparin level 0.3-0.7 units/ml Monitor platelets by anticoagulation protocol: Yes   Plan:   Continue IV heparin for at least 48h (10/17-10/19) per cardiology (will re-assess today) Heparin level remains therapeutic, will continue heparin infusion at 1550 units/hr Heparin level and CBC tomorrow morning   [place] Syler Rodriguez-Guzman PharmD, BCPS 04/20/2021 7:31 AM

## 2021-04-20 NOTE — Progress Notes (Addendum)
Physical Therapy Treatment Patient Details Name: Stephen [name] MRN: [id number] DOB: [birth year] Today's Date: 04/20/2021   History of Present Illness 75 y.o. male with a hx of  pulmonary hypertension, chronic diastolic CHF, CAD s/p L/R heart cath 2018, R heart cath 6/22,, pulmonary fibrosis s/p wedge resection, obesity, COPD on home oxygen, OSA on CPAP, DM2, CKD, renal artery stenosis, and who is being seen today for the evaluation of chest pain and elevated troponin.    PT Comments    Pt received supine in bed, agreeable to therapy as RT is just finishing with pt. Wife in room. Pt performed bed mobility with CGA - increased time and use of bed features, PT managing lines and leads due to decreased awareness by pt. STS and ambulation also CGA for safety as pt held onto O2 tank as "AD". Pt demo 24ft of ambulation with mild fatigue reported. Upon further evaluation of mobility, PT changing d/c rec to home with HHPT - pt near baseline functioning. Will continue to follow to improve steadiness in standing and functional endurance. Would benefit from skilled PT to address above deficits and promote optimal return to PLOF.   Recommendations for follow up therapy are one component of a multi-disciplinary discharge planning process, led by the attending physician.  Recommendations may be updated based on patient status, additional functional criteria and insurance authorization.  Follow Up Recommendations  Home health PT;Supervision - Intermittent     Equipment Recommendations  None recommended by PT    Recommendations for Other Services       Precautions / Restrictions Precautions Precautions: Fall Restrictions Weight Bearing Restrictions: No     Mobility  Bed Mobility Overal bed mobility: Needs Assistance Bed Mobility: Supine to Sit     Supine to sit: Min guard     General bed mobility comments: increased time and effort, PT managed lines due to decreased awareness     Transfers Overall transfer level: Needs assistance Equipment used: None Transfers: Sit to/from Stand Sit to Stand: Min guard Stand pivot transfers: Min assist       General transfer comment: CGA to steady, Pt held onto O2 tank upon standing  Ambulation/Gait Ambulation/Gait assistance: Min guard Gait Distance (Feet): 70 Feet Assistive device: 1 person hand held assist (O2 tank) Gait Pattern/deviations: Step-through pattern;Trunk flexed Gait velocity: decreased   General Gait Details: CGA to steady a pt pushed O2 tank as "steadying device" as he does at baseline. Difficulty navigating O2 tank. Increased lateral trunk sway, decreased trunk rotation and arm swing.   Stairs             Wheelchair Mobility    Modified Rankin (Stroke Patients Only)       Balance Overall balance assessment: Needs assistance Sitting-balance support: Feet supported;No upper extremity supported Sitting balance-Leahy Scale: Fair Sitting balance - Comments: able to maintain w/o VC   Standing balance support: Single extremity supported Standing balance-Leahy Scale: Fair Standing balance comment: CGA to steady on occasion with ambulation                            Cognition Arousal/Alertness: Awake/alert Behavior During Therapy: WFL for tasks assessed/performed Overall Cognitive Status: Within Functional Limits for tasks assessed                                 General Comments: A&Ox4      Exercises  General Comments        Pertinent Vitals/Pain Pain Assessment: No/denies pain    Home Living                      Prior Function            PT Goals (current goals can now be found in the care plan section) Acute Rehab PT Goals Patient Stated Goal: go home    Frequency    Min 2X/week      PT Plan      Co-evaluation              AM-PAC PT "6 Clicks" Mobility   Outcome Measure  Help needed turning from your back to  your side while in a flat bed without using bedrails?: A Little Help needed moving from lying on your back to sitting on the side of a flat bed without using bedrails?: A Little Help needed moving to and from a bed to a chair (including a wheelchair)?: A Little Help needed standing up from a chair using your arms (e.g., wheelchair or bedside chair)?: A Little Help needed to walk in hospital room?: A Little Help needed climbing 3-5 steps with a railing? : A Lot 6 Click Score: 17    End of Session Equipment Utilized During Treatment: Oxygen;Gait belt Activity Tolerance: Patient tolerated treatment well;Patient limited by fatigue Patient left: in chair;with call bell/phone within reach;with chair alarm set;with family/visitor present Nurse Communication: Mobility status PT Visit Diagnosis: Unsteadiness on feet (R26.81);Other abnormalities of gait and mobility (R26.89);Muscle weakness (generalized) (M62.81);Difficulty in walking, not elsewhere classified (R26.2)     Time: 1435-1500 PT Time Calculation (min) (ACUTE ONLY): 25 min  Charges:  $Therapeutic Activity: 23-37 mins                     Patrina Levering PT, DPT 04/20/21 3:27 PM [phone]

## 2021-04-20 NOTE — Progress Notes (Signed)
Occupational Therapy Treatment Patient Details Name: Stephen [name] MRN: [id number] DOB: 1946/04/24 Today's Date: 04/20/2021   History of present illness 75 y.o. male with a hx of  pulmonary hypertension, chronic diastolic CHF, CAD s/p L/R heart cath 2018, R heart cath 6/22,, pulmonary fibrosis s/p wedge resection, obesity, COPD on home oxygen, OSA on CPAP, DM2, CKD, renal artery stenosis, and who is being seen today for the evaluation of chest pain and elevated troponin.   OT comments  Upon entering the room, pt supine in bed with no c/o pain and agreeable to OT intervention. Pt needing assistance to EOB with mod A and increased time to EOB. Pt on 6Ls O2 via [place] throughout. Pt reports this is his baseline. Pt needing min A to stand from EOB and stand pivot transfer to recliner chair. Vitals remain WFLs. Pt's breakfast set up in front of him and he is eating breakfast as therapist leaves room. All needed items within reach.    Recommendations for follow up therapy are one component of a multi-disciplinary discharge planning process, led by the attending physician.  Recommendations may be updated based on patient status, additional functional criteria and insurance authorization.    Follow Up Recommendations  Home health OT;Supervision/Assistance - 24 hour    Equipment Recommendations  3 in 1 bedside commode       Precautions / Restrictions Precautions Precautions: Fall Restrictions Weight Bearing Restrictions: No       Mobility Bed Mobility Overal bed mobility: Needs Assistance Bed Mobility: Supine to Sit     Supine to sit: Mod assist     General bed mobility comments: mod A for LE and trunk to EOB    Transfers Overall transfer level: Needs assistance Equipment used: 1 person hand held assist Transfers: Sit to/from [facility] Sit to Stand: Min assist Stand pivot transfers: Min assist       General transfer comment: min cuing for technique     Balance Overall balance assessment: Needs assistance Sitting-balance support: Single extremity supported;Feet supported Sitting balance-Leahy Scale: Fair Sitting balance - Comments: initially poor, but with VC able to maintain sitting EOB without assist   Standing balance support: Single extremity supported Standing balance-Leahy Scale: Fair Standing balance comment: CGA to steady on occasion                           ADL either performed or assessed with clinical judgement     Vision Patient Visual Report: No change from baseline            Cognition Arousal/Alertness: Awake/alert Behavior During Therapy: WFL for tasks assessed/performed Overall Cognitive Status: Within Functional Limits for tasks assessed                                 General Comments: A&Ox4                   Pertinent Vitals/ Pain       Pain Assessment: No/denies pain         Frequency  Min 2X/week        Progress Toward Goals  OT Goals(current goals can now be found in the care plan section)  Progress towards OT goals: Progressing toward goals  Acute Rehab OT Goals Patient Stated Goal: go home OT Goal Formulation: With patient/family Time For Goal Achievement: 05/03/21 Potential to Achieve Goals: Good  Plan Discharge plan remains appropriate;Frequency needs to be updated    Co-evaluation                 AM-PAC OT "6 Clicks" Daily Activity     Outcome Measure   Help from another person eating meals?: None Help from another person taking care of personal grooming?: None Help from another person toileting, which includes using toliet, bedpan, or urinal?: A Little Help from another person bathing (including washing, rinsing, drying)?: A Little Help from another person to put on and taking off regular upper body clothing?: None Help from another person to put on and taking off regular lower body clothing?: A Little 6 Click Score: 21    End of  Session Equipment Utilized During Treatment: Oxygen (6Ls)  OT Visit Diagnosis: Other abnormalities of gait and mobility (R26.89);Muscle weakness (generalized) (M62.81)   Activity Tolerance Patient tolerated treatment well   Patient Left in chair;with call bell/phone within reach;with chair alarm set   Nurse Communication Mobility status        Time: 2103-1281 OT Time Calculation (min): 19 min  Charges: OT General Charges $OT Visit: 1 Visit OT Treatments $Therapeutic Activity: 8-22 mins  Darleen Crocker, MS, OTR/L , CBIS ascom [phone]  04/20/21, 1:07 PM

## 2021-04-20 NOTE — Progress Notes (Signed)
Progress Note  Patient Name: Stephen [name] Date of Encounter: 04/20/2021  Primary Cardiologist: Ida Rogue, MD   Subjective   Continues to note improvement in breathing /volume status. No current chest pain.  Inpatient Medications    Scheduled Meds:  aspirin EC  81 mg Oral Daily   atorvastatin  80 mg Oral QHS   calcitRIOL  0.25 mcg Oral Daily   carvedilol  12.5 mg Oral BID WC   cloNIDine  0.3 mg Oral TID   clopidogrel  75 mg Oral Daily   dorzolamide-timolol  1 drop Both Eyes BID   ezetimibe  10 mg Oral Daily   fluticasone furoate-vilanterol  1 puff Inhalation Daily   furosemide  40 mg Intravenous BID   gabapentin  1,200 mg Oral QHS   gabapentin  800 mg Oral Daily   hydrALAZINE  100 mg Oral TID   insulin aspart  0-15 Units Subcutaneous TID WC   insulin aspart  0-5 Units Subcutaneous QHS   insulin aspart  4 Units Subcutaneous TID WC   insulin glargine-yfgn  15 Units Subcutaneous QHS   ipratropium-albuterol  3 mL Nebulization Q6H   isosorbide mononitrate  120 mg Oral Daily   [START ON 04/24/2021] levothyroxine  25 mcg Oral Weekly   levothyroxine  50 mcg Oral Once per day on Mon Tue Wed Thu Fri Sat   montelukast  10 mg Oral Daily   pantoprazole  40 mg Oral Daily   potassium chloride  20 mEq Oral Daily   predniSONE  40 mg Oral Q breakfast   sodium chloride flush  3 mL Intravenous Q12H   tamsulosin  0.4 mg Oral QHS   Continuous Infusions:  sodium chloride     heparin 1,550 Units/hr (04/20/21 0457)   PRN Meds: sodium chloride, acetaminophen, albuterol, fluticasone, guaiFENesin-dextromethorphan, hydrALAZINE, HYDROcodone-acetaminophen, nitroGLYCERIN, ondansetron (ZOFRAN) IV, sodium chloride flush   Vital Signs    Vitals:   04/20/21 0143 04/20/21 0447 04/20/21 0751 04/20/21 1127  BP:  (!) 160/64 (!) 127/56 (!) 164/64  Pulse:   66 68  Resp:  20 19 18   Temp:  98.1 F (36.7 C) 97.9 F (36.6 C) 97.9 F (36.6 C)  TempSrc:  Oral Oral   SpO2:  97% 98% 99%   Weight: 119.8 kg     Height:        Intake/Output Summary (Last 24 hours) at 04/20/2021 1337 Last data filed at 04/20/2021 1127 Gross per 24 hour  Intake 353.26 ml  Output 2625 ml  Net -2271.74 ml    Last 3 Weights 04/20/2021 04/18/2021 03/08/2021  Weight (lbs) 264 lb 1.8 oz 266 lb 268 lb  Weight (kg) 119.8 kg 120.657 kg 121.564 kg  Some encounter information is confidential and restricted. Go to Review Flowsheets activity to see all data.      Telemetry    NSR, PVCs 80s to low 100s - Personally Reviewed  ECG    No new tracings today - Personally Reviewed  Physical Exam   GEN: Obese male lying in bed Neck: JVD difficult to assess due to body habitus Cardiac: Distant, RRR, 1/6 systolic murmur, rubs, or gallops.  Respiratory: Distant breath sounds with bibasilar crackles GI: Firm, distended MS: moderate to 1+ bilateral pitting edema; No deformity. Neuro:  Nonfocal  Psych: Normal affect   Labs    High Sensitivity Troponin:   Recent Labs  Lab 04/05/21 2016 04/17/21 2101 04/18/21 0505 04/18/21 0658 04/18/21 1240  TROPONINIHS 107* 74* 2,129* 1,997* 1,842*  Chemistry Recent Labs  Lab 04/17/21 2101 04/18/21 0505 04/19/21 0259 04/19/21 1416 04/20/21 0541  NA 141 140 137  --  137  K 4.0 3.8 3.9  --  3.7  CL 105 106 101  --  102  CO2 27 26 25   --  28  GLUCOSE 216* 283* 320*  --  287*  BUN 36* 35* 47*  --  67*  CREATININE 2.66* 2.74* 2.80* 2.89* 2.68*  CALCIUM 8.3* 8.2* 8.5*  --  8.4*  PROT 6.8  --   --   --   --   ALBUMIN 2.9*  --   --   --   --   AST 22  --   --   --   --   ALT 17  --   --   --   --   ALKPHOS 84  --   --   --   --   BILITOT 0.7  --   --   --   --   GFRNONAA 25* 24* 23* 22* 24*  ANIONGAP 9 8 11   --  7      Hematology Recent Labs  Lab 04/19/21 0259 04/19/21 1317 04/20/21 0541  WBC 11.0* 10.0 11.2*  RBC 4.47 4.27 4.00*  HGB 13.0 12.0* 11.1*  HCT 39.4 37.5* 35.1*  MCV 88.1 87.8 87.8  MCH 29.1 28.1 27.8  MCHC 33.0 32.0  31.6  RDW 15.3 15.3 15.3  PLT 191 205 192     BNP Recent Labs  Lab 04/17/21 2058 04/19/21 0359  BNP 250.6* 680.2*      DDimer No results for input(s): DDIMER in the last 168 hours.   Radiology    DG Chest Port 1 View  Result Date: 04/19/2021 CLINICAL DATA:  Shortness of breath, chest pain, history of CHF EXAM: PORTABLE CHEST 1 VIEW COMPARISON:  Chest radiograph 04/17/2021 FINDINGS: The heart is enlarged, unchanged. The mediastinal contours are stable. Increased vascular markings are again seen throughout both lungs, slightly improved in the interim. There is no new or worsening focal airspace disease. There is no pleural effusion. There is no pneumothorax. There is no acute osseous abnormality. IMPRESSION: Increased interstitial markings in both lungs are slightly improved compared to the prior study, suggesting improving pulmonary edema and/or infection Electronically Signed   By: Valetta Mole M.D.   On: 04/19/2021 09:10    Cardiac Studies   Echo 04/18/21  1. Left ventricular ejection fraction, by estimation, is 60 to 65%. The  left ventricle has normal function. Left ventricular endocardial border  not optimally defined to evaluate regional wall motion. The left  ventricular internal cavity size was mildly  dilated. There is mild left ventricular hypertrophy. Left ventricular  diastolic parameters are consistent with Grade I diastolic dysfunction  (impaired relaxation). Elevated left atrial pressure.   2. Right ventricular systolic function is normal. The right ventricular  size is not well visualized.   3. The mitral valve is abnormal. Trivial mitral valve regurgitation. Mild  mitral stenosis. The mean mitral valve gradient is 5.0 mmHg.   4. Tricuspid valve regurgitation not well assessed.   5. The aortic valve has an indeterminant number of cusps. There is mild  calcification of the aortic valve. There is moderate thickening of the  aortic valve. Aortic valve  regurgitation is trivial. Mild to moderate  aortic valve sclerosis/calcification is  present, without any evidence of aortic stenosis.   [place] 12/06/20 Successful right heart catheterization via the right brachial vein. Right  heart catheterization showed mildly to moderately elevated filling pressures, mild pulmonary hypertension and high cardiac output. RA: 13 mmHg PCW: 20 mmHg with a V wave of 27 mmHg PA: 44/ 18 with a mean of 30 mmHg.  Pulmonary vascular resistance is only 1.14 Woods unit RV: 48 /12 mmHg PA sat is 97% with cardiac output of 8.79 and cardiac index of 3.98. Recommendations: Recommend increasing torsemide to 40 mg once daily.  Check basic metabolic profile in 1 week.   Coronary angiography 08/19/20 Coronary Diagrams     Diagnostic Dominance: Right     Coronary angiography:  Coronary dominance: Right  Left mainstem:   Large vessel that bifurcates into the LAD and left circumflex  Left anterior descending (LAD):   Large vessel that extends to the apical region, diagonal branch 2 of moderate size, moderate proximal and mid LAD disease.  Moderate ostial diagonal disease D1-D2  Left circumflex (LCx):  Large vessel with OM branch 2, prior stent mid vessel, occluded at this location, collaterals from left to left, right to left  Right coronary artery (RCA):  Right dominant vessel with PL and PDA, moderate to severe mid vessel disease 70%, 50% distal disease  Left ventriculography: Crossed for pressures, LV gram not performed  Final Conclusions:   Challenging access  given morbid obesity, weight 270 pounds Severe left circumflex disease occluded mid vessel, known from prior catheterization, collaterals Moderate to severe mid to distal RCA disease, unchanged from prior catheterization Moderate proximal and mid LAD disease Difficult groin access given morbid obesity, arterial accessed easily, Ultrasound used to visualize the vein, not well visualized apart from distal  vessel but was too proximal to the artery to engage Grossly vein did not appear markedly dilated.  Recommendations:  Medical management recommended If right heart catheterization felt needed, would need to change access.  Neck not a appropriate choice given thick neck, obesity --Markedly elevated blood pressure during the case Recommend aggressive blood pressure control, this is likely contributing to his shortness of breath -Also with morbid obesity, weight continues to trend upwards, recommend strict low carbohydrate diet Goal weight 200   LM lesion is 30% stenosed. Prox LAD lesion is 40% stenosed. Mid LAD lesion is 50% stenosed. Dist LAD lesion is 30% stenosed. Mid Cx lesion is 100% stenosed. Prox RCA lesion is 20% stenosed. Dist RCA lesion is 50% stenosed. Mid RCA-1 lesion is 10% stenosed. Mid RCA-2 lesion is 70% stenosed. RPAV lesion is 40% stenosed. 2nd Diag lesion is 50% stenosed. 1st Diag lesion is 45% stenosed.         Echo 08/06/20  1. Left ventricular ejection fraction, by estimation, is 55 to 60%. The  left ventricle has normal function. The left ventricle has no regional  wall motion abnormalities. Left ventricular diastolic parameters are  consistent with Grade I diastolic  dysfunction (impaired relaxation).   2. Right ventricular systolic function is normal. The right ventricular  size is normal.   3. Left atrial size was moderately dilated.   4. Challenging images    US renal artery 07/2020 Summary:  Renal:  Right: Normal size right kidney. 1-59% stenosis of the right renal         artery. Unable to visualize the Aorta and segments of the         Right Renal Artery.  Left:  Normal size of left kidney. Technically difficult due to         bowel Gas; unable to visualize the Renal Artery.  Patient Profile     75 y.o. male with a hx of  pulmonary hypertension, chronic diastolic CHF, CAD, pulmonary fibrosis s/p wedge resection, obesity, COPD on home  oxygen, OSA on CPAP, DM2, CKD, renal artery stenosis, and who is being seen today for the evaluation of chest pain and elevated HS Tn.  Assessment & Plan    Acute on Chronic diastolic heart failure   Pulmonary hypertension -- Presented with chest tightness and shortness of breath in the setting of volume overload.  Volume status and symptoms improving with diuresis. Most recent echo / RHC as above and EF 60 to 65%.  Remains volume up today and hypertensive with plan for ongoing diuresis. Continue IV diuresis with Lasix 40 mg twice daily.  Daily I/os and wt Net output is -4.1L with wt 120.7kg  119.8kg  Daily BMET.   Cr 2.8  2.68, BUN 35  67.  Continue Coreg.  No ACE/ARB due to renal function.   Ongoing BP support recommended.   Wean prednisone as tolerated, as steroids are associated with worsening volume status and hyperglycemia.  Non-STEMI Coronary artery disease -- Has reported chest pain/tightness in the setting of volume overload. He has known multivessel CAD. 2018 LHC with recommendation for medical therapy at that time.  HS Tn peaked at 2129. EKG at presentation without acute changes.  Echo EF 60 to 65%. HS Tn elevation and presentation thought most consistent with supply demand ischemia in the setting of his current overload, elevated BP, and acute on chronic kidney disease.  Continue IV heparin for at least 48h (10/17-10/19) Continue ASA, Plavix, carvedilol, atorvastatin, Zetia, and Imdur.   No plan for catheterization given patient's renal function and as he is at risk for contrast-induced nephropathy and unable to lay flat due to orthopnea.  Recommend ongoing medical management, along with BP control and diuresis.  Aggressive risk factor modification recommended.    HTN, goal BP less than 130/80 -- BP poorly controlled.  Continue clonidine, Imdur, torsemide, hydralazine, and Coreg. Continue IV diuresis.  Suspect BP improvement with improved volume status.  Ideally, wean off of  hydralazine and clonidine in the near future/outpatient setting to avoid rebound hypertension and rebound tachycardia.     CKD 3 -- Followed by [facility] in the outpatient setting.  Baseline Cr 2.5-2.9.  Monitor closely with diuresis.  Caution with nephrotoxins.  DM2 -- SSI, per IM.  Wean prednisone as tolerated, given hyperglycemia.  No ACE/ARB given renal function.   Hypothyroidism -- Continue treatment per IM.  02/21/2021 TSH 7.12.   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  Wean prednisone as tolerated given hyperglycemia and volume overload.  OSA on CPAP --Recommend CPAP use at night.   For questions or updates, please contact [place] Please consult [email or web address] for contact info under        Signed, Arvil Chaco, PA-C  04/20/2021, 1:37 PM

## 2021-04-20 NOTE — Plan of Care (Signed)
  Problem: Clinical Measurements: Goal: Ability to maintain clinical measurements within normal limits will improve Outcome: Progressing   Problem: Clinical Measurements: Goal: Respiratory complications will improve Outcome: Progressing   Problem: Clinical Measurements: Goal: Cardiovascular complication will be avoided Outcome: Progressing   Problem: Activity: Goal: Risk for activity intolerance will decrease Outcome: Progressing   Problem: Pain Managment: Goal: General experience of comfort will improve Outcome: Progressing   Problem: Safety: Goal: Ability to remain free from injury will improve Outcome: Progressing

## 2021-04-21 DIAGNOSIS — I5033 Acute on chronic diastolic (congestive) heart failure: Secondary | ICD-10-CM | POA: Diagnosis not present

## 2021-04-21 DIAGNOSIS — N179 Acute kidney failure, unspecified: Secondary | ICD-10-CM | POA: Diagnosis not present

## 2021-04-21 DIAGNOSIS — I25118 Atherosclerotic heart disease of native coronary artery with other forms of angina pectoris: Secondary | ICD-10-CM

## 2021-04-21 DIAGNOSIS — I214 Non-ST elevation (NSTEMI) myocardial infarction: Secondary | ICD-10-CM | POA: Diagnosis not present

## 2021-04-21 LAB — CBC
HCT: 32.4 % — ABNORMAL LOW (ref 39.0–52.0)
Hemoglobin: 10.7 g/dL — ABNORMAL LOW (ref 13.0–17.0)
MCH: 29.2 pg (ref 26.0–34.0)
MCHC: 33 g/dL (ref 30.0–36.0)
MCV: 88.3 fL (ref 80.0–100.0)
Platelets: 175 10*3/uL (ref 150–400)
RBC: 3.67 MIL/uL — ABNORMAL LOW (ref 4.22–5.81)
RDW: 15 % (ref 11.5–15.5)
WBC: 12.2 10*3/uL — ABNORMAL HIGH (ref 4.0–10.5)
nRBC: 0 % (ref 0.0–0.2)

## 2021-04-21 LAB — GLUCOSE, CAPILLARY
Glucose-Capillary: 283 mg/dL — ABNORMAL HIGH (ref 70–99)
Glucose-Capillary: 339 mg/dL — ABNORMAL HIGH (ref 70–99)
Glucose-Capillary: 386 mg/dL — ABNORMAL HIGH (ref 70–99)
Glucose-Capillary: 392 mg/dL — ABNORMAL HIGH (ref 70–99)

## 2021-04-21 LAB — BASIC METABOLIC PANEL
Anion gap: 5 (ref 5–15)
BUN: 70 mg/dL — ABNORMAL HIGH (ref 8–23)
CO2: 29 mmol/L (ref 22–32)
Calcium: 8.3 mg/dL — ABNORMAL LOW (ref 8.9–10.3)
Chloride: 100 mmol/L (ref 98–111)
Creatinine, Ser: 2.99 mg/dL — ABNORMAL HIGH (ref 0.61–1.24)
GFR, Estimated: 21 mL/min — ABNORMAL LOW (ref >60)
Glucose, Bld: 302 mg/dL — ABNORMAL HIGH (ref 70–99)
Potassium: 3.8 mmol/L (ref 3.5–5.1)
Sodium: 134 mmol/L — ABNORMAL LOW (ref 135–145)

## 2021-04-21 LAB — HEPARIN LEVEL (UNFRACTIONATED)
Heparin Unfractionated: 0.94 IU/mL — ABNORMAL HIGH (ref 0.30–0.70)
Heparin Unfractionated: 0.46 IU/mL (ref 0.30–0.70)

## 2021-04-21 MED ORDER — INSULIN GLARGINE-YFGN 100 UNIT/ML ~~LOC~~ SOLN
35.0000 [IU] | Freq: Every day | SUBCUTANEOUS | Status: DC
Start: 2021-04-21 — End: 2021-04-25
  Administered 2021-04-21 – 2021-04-24 (×3): 35 [IU] via SUBCUTANEOUS
  Filled 2021-04-21 (×7): qty 0.35

## 2021-04-21 MED ORDER — INSULIN ASPART 100 UNIT/ML IJ SOLN
8.0000 [IU] | Freq: Three times a day (TID) | INTRAMUSCULAR | Status: DC
  Administered 2021-04-21 – 2021-04-30 (×27): 8 [IU] via SUBCUTANEOUS
  Filled 2021-04-21 (×23): qty 1

## 2021-04-21 MED ORDER — GABAPENTIN 300 MG PO CAPS
600.0000 mg | ORAL_CAPSULE | Freq: Every day | ORAL | Status: DC
  Administered 2021-04-21: 600 mg via ORAL
  Filled 2021-04-21: qty 2

## 2021-04-21 MED ORDER — GABAPENTIN 400 MG PO CAPS
400.0000 mg | ORAL_CAPSULE | Freq: Every day | ORAL | Status: DC
  Administered 2021-04-22: 400 mg via ORAL
  Filled 2021-04-21: qty 1

## 2021-04-21 MED ORDER — FUROSEMIDE 10 MG/ML IJ SOLN
40.0000 mg | Freq: Two times a day (BID) | INTRAMUSCULAR | Status: DC
  Administered 2021-04-21: 40 mg via INTRAVENOUS
  Filled 2021-04-21: qty 4

## 2021-04-21 NOTE — Plan of Care (Signed)
Problem: Education: Goal: Knowledge of General Education information will improve Description: Including pain rating scale, medication(s)/side effects and non-pharmacologic comfort measures 04/21/2021 1238 by Cristela Blue, RN Outcome: Progressing 04/21/2021 1238 by Cristela Blue, RN Outcome: Progressing   Problem: Health Behavior/Discharge Planning: Goal: Ability to manage health-related needs will improve 04/21/2021 1238 by Cristela Blue, RN Outcome: Progressing 04/21/2021 1238 by Cristela Blue, RN Outcome: Progressing   Problem: Clinical Measurements: Goal: Ability to maintain clinical measurements within normal limits will improve 04/21/2021 1238 by Cristela Blue, RN Outcome: Progressing 04/21/2021 1238 by Cristela Blue, RN Outcome: Progressing Goal: Will remain free from infection 04/21/2021 1238 by Cristela Blue, RN Outcome: Progressing 04/21/2021 1238 by Cristela Blue, RN Outcome: Progressing Goal: Diagnostic test results will improve 04/21/2021 1238 by Cristela Blue, RN Outcome: Progressing 04/21/2021 1238 by Cristela Blue, RN Outcome: Progressing Goal: Respiratory complications will improve 04/21/2021 1238 by Cristela Blue, RN Outcome: Progressing 04/21/2021 1238 by Cristela Blue, RN Outcome: Progressing Goal: Cardiovascular complication will be avoided 04/21/2021 1238 by Cristela Blue, RN Outcome: Progressing 04/21/2021 1238 by Cristela Blue, RN Outcome: Progressing   Problem: Activity: Goal: Risk for activity intolerance will decrease 04/21/2021 1238 by Cristela Blue, RN Outcome: Progressing 04/21/2021 1238 by Cristela Blue, RN Outcome: Progressing   Problem: Nutrition: Goal: Adequate nutrition will be maintained 04/21/2021 1238 by Cristela Blue, RN Outcome: Progressing 04/21/2021 1238 by Cristela Blue, RN Outcome: Progressing   Problem: Coping: Goal: Level of anxiety will decrease 04/21/2021 1238 by Cristela Blue, RN Outcome:  Progressing 04/21/2021 1238 by Cristela Blue, RN Outcome: Progressing   Problem: Elimination: Goal: Will not experience complications related to bowel motility 04/21/2021 1238 by Cristela Blue, RN Outcome: Progressing 04/21/2021 1238 by Cristela Blue, RN Outcome: Progressing Goal: Will not experience complications related to urinary retention 04/21/2021 1238 by Cristela Blue, RN Outcome: Progressing 04/21/2021 1238 by Cristela Blue, RN Outcome: Progressing   Problem: Pain Managment: Goal: General experience of comfort will improve 04/21/2021 1238 by Cristela Blue, RN Outcome: Progressing 04/21/2021 1238 by Cristela Blue, RN Outcome: Progressing   Problem: Safety: Goal: Ability to remain free from injury will improve 04/21/2021 1238 by Cristela Blue, RN Outcome: Progressing 04/21/2021 1238 by Cristela Blue, RN Outcome: Progressing   Problem: Skin Integrity: Goal: Risk for impaired skin integrity will decrease 04/21/2021 1238 by Cristela Blue, RN Outcome: Progressing 04/21/2021 1238 by Cristela Blue, RN Outcome: Progressing   Problem: Education: Goal: Knowledge of General Education information will improve Description: Including pain rating scale, medication(s)/side effects and non-pharmacologic comfort measures 04/21/2021 1238 by Cristela Blue, RN Outcome: Progressing 04/21/2021 1238 by Cristela Blue, RN Outcome: Progressing   Problem: Health Behavior/Discharge Planning: Goal: Ability to manage health-related needs will improve 04/21/2021 1238 by Cristela Blue, RN Outcome: Progressing 04/21/2021 1238 by Cristela Blue, RN Outcome: Progressing   Problem: Clinical Measurements: Goal: Ability to maintain clinical measurements within normal limits will improve 04/21/2021 1238 by Cristela Blue, RN Outcome: Progressing 04/21/2021 1238 by Cristela Blue, RN Outcome: Progressing Goal: Will remain free from infection 04/21/2021 1238 by Cristela Blue,  RN Outcome: Progressing 04/21/2021 1238 by Cristela Blue, RN Outcome: Progressing Goal: Diagnostic test results will improve 04/21/2021 1238 by Cristela Blue, RN Outcome: Progressing 04/21/2021 1238 by Cristela Blue, RN Outcome: Progressing Goal: Respiratory complications will improve 04/21/2021 1238 by Cristela Blue, RN Outcome: Progressing 04/21/2021 1238 by Cristela Blue, RN Outcome: Progressing Goal: Cardiovascular complication will be avoided 04/21/2021 1238 by Cristela Blue, RN Outcome: Progressing 04/21/2021 1238 by Cristela Blue, RN Outcome: Progressing   Problem: Activity: Goal:  Risk for activity intolerance will decrease 04/21/2021 1238 by Cristela Blue, RN Outcome: Progressing 04/21/2021 1238 by Cristela Blue, RN Outcome: Progressing   Problem: Nutrition: Goal: Adequate nutrition will be maintained 04/21/2021 1238 by Cristela Blue, RN Outcome: Progressing 04/21/2021 1238 by Cristela Blue, RN Outcome: Progressing   Problem: Coping: Goal: Level of anxiety will decrease 04/21/2021 1238 by Cristela Blue, RN Outcome: Progressing 04/21/2021 1238 by Cristela Blue, RN Outcome: Progressing   Problem: Elimination: Goal: Will not experience complications related to bowel motility 04/21/2021 1238 by Cristela Blue, RN Outcome: Progressing 04/21/2021 1238 by Cristela Blue, RN Outcome: Progressing Goal: Will not experience complications related to urinary retention 04/21/2021 1238 by Cristela Blue, RN Outcome: Progressing 04/21/2021 1238 by Cristela Blue, RN Outcome: Progressing   Problem: Pain Managment: Goal: General experience of comfort will improve 04/21/2021 1238 by Cristela Blue, RN Outcome: Progressing 04/21/2021 1238 by Cristela Blue, RN Outcome: Progressing   Problem: Safety: Goal: Ability to remain free from injury will improve 04/21/2021 1238 by Cristela Blue, RN Outcome: Progressing 04/21/2021 1238 by Cristela Blue,  RN Outcome: Progressing   Problem: Skin Integrity: Goal: Risk for impaired skin integrity will decrease 04/21/2021 1238 by Cristela Blue, RN Outcome: Progressing 04/21/2021 1238 by Cristela Blue, RN Outcome: Progressing   Problem: Education: Goal: Understanding of cardiac disease, CV risk reduction, and recovery process will improve 04/21/2021 1238 by Cristela Blue, RN Outcome: Progressing 04/21/2021 1238 by Cristela Blue, RN Outcome: Progressing Goal: Individualized Educational Video(s) 04/21/2021 1238 by Cristela Blue, RN Outcome: Progressing 04/21/2021 1238 by Cristela Blue, RN Outcome: Progressing   Problem: Activity: Goal: Ability to tolerate increased activity will improve 04/21/2021 1238 by Cristela Blue, RN Outcome: Progressing 04/21/2021 1238 by Cristela Blue, RN Outcome: Progressing   Problem: Cardiac: Goal: Ability to achieve and maintain adequate cardiovascular perfusion will improve 04/21/2021 1238 by Cristela Blue, RN Outcome: Progressing 04/21/2021 1238 by Cristela Blue, RN Outcome: Progressing   Problem: Health Behavior/Discharge Planning: Goal: Ability to safely manage health-related needs after discharge will improve 04/21/2021 1238 by Cristela Blue, RN Outcome: Progressing 04/21/2021 1238 by Cristela Blue, RN Outcome: Progressing

## 2021-04-21 NOTE — Progress Notes (Signed)
Occupational Therapy Treatment Patient Details Name: Stephen [name] MRN: [id number] DOB: Dec 25, 1945 Today's Date: 04/21/2021   History of present illness 75 y.o. male with a hx of  pulmonary hypertension, chronic diastolic CHF, CAD s/p L/R heart cath 2018, R heart cath 6/22,, pulmonary fibrosis s/p wedge resection, obesity, COPD on home oxygen, OSA on CPAP, DM2, CKD, renal artery stenosis, and who is being seen today for the evaluation of chest pain and elevated troponin.   OT comments  Pt seen for OT tx this date. Pt performed bed mobility with increased effort/time and 1 VC to use bed rail to support trunk elevation, CGA from OT required. Pt stood with SBA for safety, and tolerated standing at the sink for grooming tasks with intermittent UE support on the counter. He negotiated room around the bed using the bed rails and foot board for occasional UE support but no overt LOB. Pt tolerated session well. Denied SOB. Pt progressing towards goals, continue to recommend [place] services.    Recommendations for follow up therapy are one component of a multi-disciplinary discharge planning process, led by the attending physician.  Recommendations may be updated based on patient status, additional functional criteria and insurance authorization.    Follow Up Recommendations  Home health OT;Supervision/Assistance - 24 hour    Equipment Recommendations  3 in 1 bedside commode    Recommendations for Other Services      Precautions / Restrictions Precautions Precautions: Fall Restrictions Weight Bearing Restrictions: No       Mobility Bed Mobility Overal bed mobility: Needs Assistance Bed Mobility: Supine to Sit     Supine to sit: Min guard;HOB elevated     General bed mobility comments: increased effort time, cue for use of bed rail    Transfers Overall transfer level: Needs assistance Equipment used: None Transfers: Sit to/from Stand Sit to Stand: Min guard               Balance Overall balance assessment: Needs assistance Sitting-balance support: Feet supported;No upper extremity supported Sitting balance-Leahy Scale: Good     Standing balance support: Single extremity supported;No upper extremity supported Standing balance-Leahy Scale: Fair                             ADL either performed or assessed with clinical judgement   ADL                                         General ADL Comments: Pt requires SBA/CGA for ADL transfers and Supv/SBA for standing grooming tasks at the sink without LOB noted.     Vision       Perception     Praxis      Cognition Arousal/Alertness: Awake/alert Behavior During Therapy: WFL for tasks assessed/performed Overall Cognitive Status: Within Functional Limits for tasks assessed                                          Exercises Other Exercises Other Exercises: Pt negotiated from one side of the bed around to the other using PRN RUE support on the bed rails/foot board for improved stability/confidence.   Shoulder Instructions       General Comments      Pertinent Vitals/ Pain  Pain Assessment: No/denies pain  Home Living                                          Prior Functioning/Environment              Frequency  Min 2X/week        Progress Toward Goals  OT Goals(current goals can now be found in the care plan section)  Progress towards OT goals: Progressing toward goals  Acute Rehab OT Goals Patient Stated Goal: go home OT Goal Formulation: With patient/family Time For Goal Achievement: 05/03/21 Potential to Achieve Goals: Good  Plan Discharge plan remains appropriate;Frequency needs to be updated    Co-evaluation                 AM-PAC OT "6 Clicks" Daily Activity     Outcome Measure   Help from another person eating meals?: None Help from another person taking care of personal grooming?:  None Help from another person toileting, which includes using toliet, bedpan, or urinal?: A Little Help from another person bathing (including washing, rinsing, drying)?: A Little Help from another person to put on and taking off regular upper body clothing?: None Help from another person to put on and taking off regular lower body clothing?: A Little 6 Click Score: 21    End of Session Equipment Utilized During Treatment: Gait belt;Oxygen  OT Visit Diagnosis: Other abnormalities of gait and mobility (R26.89);Muscle weakness (generalized) (M62.81)   Activity Tolerance Patient tolerated treatment well   Patient Left in chair;with call bell/phone within reach;with chair alarm set   Nurse Communication          Time: [phone] OT Time Calculation (min): 16 min  Charges: OT General Charges $OT Visit: 1 Visit OT Treatments $Self Care/Home Management : 8-22 mins  Ardeth Perfect., MPH, MS, OTR/L ascom [phone] 04/21/21, 2:07 PM

## 2021-04-21 NOTE — Progress Notes (Signed)
Inpatient Diabetes Program Recommendations  AACE/ADA: New Consensus Statement on Inpatient Glycemic Control (2015)  Target Ranges:  Prepandial:   less than 140 mg/dL      Peak postprandial:   less than 180 mg/dL (1-2 hours)      Critically ill patients:  140 - 180 mg/dL   Lab Results  Component Value Date   GLUCAP 283 (H) 04/21/2021   HGBA1C 6.7 (H) 02/21/2021    Review of Glycemic Control Results for Stephen [name], Stephen [name] (MRN [id number]) as of 04/21/2021 10:59  Ref. Range 04/20/2021 07:48 04/20/2021 11:22 04/20/2021 16:23 04/20/2021 19:52 04/21/2021 07:50  Glucose-Capillary Latest Ref Range: 70 - 99 mg/dL 237 (H) 349 (H) 425 (H) 390 (H) 283 (H)   Diabetes history: DM 2 Outpatient Diabetes medications: 70/30 60 units qam, 40 units qpm Current orders for Inpatient glycemic control:  Semglee 15 units Novolog 4 units tid meal coverage Novolog 0-15 units tid + hs  Inpatient Diabetes Program Recommendations:   Patient is on home insulin doses of 70/30 60 units am , 40 units pm which includes approximately 70 units basal + 30 units meal coverage. Please Consider: -Increase Semglee to 35 units (50% home basal dose) -Increase Novolog meal coverage to 8 units tid if eats 50% Secure chat sent to Dr. Jimmye Norman.  Thank you, Stephen [name]. Stephen Kantner, RN, MSN, CDE  Diabetes Coordinator Inpatient Glycemic Control Team Team Pager [phone] (8am-5pm) 04/21/2021 10:58 AM

## 2021-04-21 NOTE — Plan of Care (Signed)
  Problem: Health Behavior/Discharge Planning: Goal: Ability to manage health-related needs will improve Outcome: Progressing   Problem: Clinical Measurements: Goal: Respiratory complications will improve Outcome: Progressing   Problem: Clinical Measurements: Goal: Cardiovascular complication will be avoided Outcome: Progressing   Problem: Activity: Goal: Risk for activity intolerance will decrease Outcome: Progressing   Problem: Pain Managment: Goal: General experience of comfort will improve Outcome: Progressing   Problem: Safety: Goal: Ability to remain free from injury will improve Outcome: Progressing   

## 2021-04-21 NOTE — Progress Notes (Signed)
Progress Note  Patient Name: Stephen [name] Date of Encounter: 04/21/2021  Primary Cardiologist: Ida Rogue, MD  Subjective   Feels that breathing is currently at baseline. Still on 6lpm, which is what he was using @ home.  Ambulated last night w/ PT - 70 ft w/ mild fatigue, which he feels is his baseline.  HHPT recommended.  No chest pain or dyspnea @ rest.  Still with mild lower ext swelling.  Inpatient Medications    Scheduled Meds:  aspirin EC  81 mg Oral Daily   atorvastatin  80 mg Oral QHS   calcitRIOL  0.25 mcg Oral Daily   carvedilol  12.5 mg Oral BID WC   cloNIDine  0.3 mg Oral TID   clopidogrel  75 mg Oral Daily   dorzolamide-timolol  1 drop Both Eyes BID   ezetimibe  10 mg Oral Daily   fluticasone furoate-vilanterol  1 puff Inhalation Daily   furosemide  40 mg Intravenous BID   gabapentin  1,200 mg Oral QHS   gabapentin  800 mg Oral Daily   hydrALAZINE  100 mg Oral TID   insulin aspart  0-15 Units Subcutaneous TID WC   insulin aspart  0-5 Units Subcutaneous QHS   insulin aspart  8 Units Subcutaneous TID WC   insulin glargine-yfgn  35 Units Subcutaneous QHS   ipratropium-albuterol  3 mL Nebulization Q6H   isosorbide mononitrate  120 mg Oral Daily   [START ON 04/24/2021] levothyroxine  25 mcg Oral Weekly   levothyroxine  50 mcg Oral Once per day on Mon Tue Wed Thu Fri Sat   montelukast  10 mg Oral Daily   multivitamin with minerals  1 tablet Oral Daily   pantoprazole  40 mg Oral Daily   potassium chloride  20 mEq Oral Daily   predniSONE  40 mg Oral Q breakfast   Ensure Max Protein  11 oz Oral BID   sodium chloride flush  3 mL Intravenous Q12H   tamsulosin  0.4 mg Oral QHS   Continuous Infusions:  sodium chloride     heparin 1,350 Units/hr (04/21/21 0835)   PRN Meds: sodium chloride, acetaminophen, albuterol, fluticasone, guaiFENesin-dextromethorphan, hydrALAZINE, HYDROcodone-acetaminophen, nitroGLYCERIN, ondansetron (ZOFRAN) IV, sodium chloride  flush   Vital Signs    Vitals:   04/21/21 0212 04/21/21 0406 04/21/21 0752 04/21/21 1147  BP:  (!) 123/51 (!) 151/59 (!) 107/58  Pulse:   (!) 59 60  Resp:  18 18 17   Temp:  97.9 F (36.6 C) 98 F (36.7 C) 98.6 F (37 C)  TempSrc:  Oral Oral Oral  SpO2:  93% 100% 99%  Weight: 119.3 kg     Height:        Intake/Output Summary (Last 24 hours) at 04/21/2021 1207 Last data filed at 04/21/2021 1148 Gross per 24 hour  Intake 1509.72 ml  Output 2150 ml  Net -640.28 ml   Filed Weights   04/18/21 0604 04/20/21 0143 04/21/21 0212  Weight: 120.7 kg 119.8 kg 119.3 kg    Physical Exam   GEN: Obese, in no acute distress.  HEENT: Grossly normal.  Neck: Supple, obese, difficult to gauge JVP.  No carotid bruits, or masses. Cardiac: RRR, distant, no murmurs, rubs, or gallops. No clubbing, cyanosis, 1+ bilat LE edema.  Radials 2+, DP/PT 1+ and equal bilaterally.  Respiratory:  Respirations regular and unlabored, bibasilar crackles. GI: Obese, semi-firm, nontender, BS + x 4. MS: no deformity or atrophy. Skin: warm and dry, no rash. Neuro:  Strength and sensation are intact. Psych: AAOx3.  Normal affect.  Labs    Chemistry Recent Labs  Lab 04/17/21 2101 04/18/21 0505 04/19/21 0259 04/19/21 1416 04/20/21 0541 04/21/21 0619  NA 141   < > 137  --  137 134*  K 4.0   < > 3.9  --  3.7 3.8  CL 105   < > 101  --  102 100  CO2 27   < > 25  --  28 29  GLUCOSE 216*   < > 320*  --  287* 302*  BUN 36*   < > 47*  --  67* 70*  CREATININE 2.66*   < > 2.80* 2.89* 2.68* 2.99*  CALCIUM 8.3*   < > 8.5*  --  8.4* 8.3*  PROT 6.8  --   --   --   --   --   ALBUMIN 2.9*  --   --   --   --   --   AST 22  --   --   --   --   --   ALT 17  --   --   --   --   --   ALKPHOS 84  --   --   --   --   --   BILITOT 0.7  --   --   --   --   --   GFRNONAA 25*   < > 23* 22* 24* 21*  ANIONGAP 9   < > 11  --  7 5   < > = values in this interval not displayed.     Hematology Recent Labs  Lab  04/19/21 1317 04/20/21 0541 04/21/21 0619  WBC 10.0 11.2* 12.2*  RBC 4.27 4.00* 3.67*  HGB 12.0* 11.1* 10.7*  HCT 37.5* 35.1* 32.4*  MCV 87.8 87.8 88.3  MCH 28.1 27.8 29.2  MCHC 32.0 31.6 33.0  RDW 15.3 15.3 15.0  PLT 205 192 175    Cardiac Enzymes  Recent Labs  Lab 04/05/21 2016 04/17/21 2101 04/18/21 0505 04/18/21 0658 04/18/21 1240  TROPONINIHS 107* 74* 2,129* 1,997* 1,842*      BNP Recent Labs  Lab 04/17/21 2058 04/19/21 0359  BNP 250.6* 680.2*     Lipids  Lab Results  Component Value Date   CHOL 125 12/23/2018   HDL 27.70 (L) 12/23/2018   LDLCALC 69 12/23/2018   TRIG 142.0 12/23/2018   CHOLHDL 5 12/23/2018    HbA1c  Lab Results  Component Value Date   HGBA1C 6.7 (H) 02/21/2021    Radiology    DG Chest Port 1 View  Result Date: 04/19/2021 CLINICAL DATA:  Shortness of breath, chest pain, history of CHF EXAM: PORTABLE CHEST 1 VIEW COMPARISON:  Chest radiograph 04/17/2021 FINDINGS: The heart is enlarged, unchanged. The mediastinal contours are stable. Increased vascular markings are again seen throughout both lungs, slightly improved in the interim. There is no new or worsening focal airspace disease. There is no pleural effusion. There is no pneumothorax. There is no acute osseous abnormality. IMPRESSION: Increased interstitial markings in both lungs are slightly improved compared to the prior study, suggesting improving pulmonary edema and/or infection Electronically Signed   By: Valetta Mole M.D.   On: 04/19/2021 09:10   DG Chest Portable 1 View  Result Date: 04/17/2021 CLINICAL DATA:  Shortness of breath EXAM: PORTABLE CHEST 1 VIEW COMPARISON:  04/05/2021 FINDINGS: Cardiac shadow is prominent but stable. Aortic calcifications are again seen. Lungs are well aerated  bilaterally with persistent airspace opacities bilaterally stable from the prior exam. No bony abnormality is noted. IMPRESSION: No significant interval change from the prior study.  Electronically Signed   By: Inez Catalina M.D.   On: 04/17/2021 21:11   Telemetry    RSR - Personally Reviewed  Cardiac Studies   Cardiac Catheterization  2.2022  Diagnostic Dominance: Right  _____________  Cardiac Catheterization  6.6.2022  RA: 13 mmHg PCW: 20 mmHg with a V wave of 27 mmHg PA: 44/ 18 with a mean of 30 mmHg.  Pulmonary vascular resistance is only 1.14 [place] unit RV: 48 /12 mmHg PA sat is 97% with cardiac output of 8.79 and cardiac index of 3.98.   Recommendations: Recommend increasing torsemide to 40 mg once daily.  Check basic metabolic profile in 1 week.. _____________   2D Echocardiogram 10.17.2022  1. Left ventricular ejection fraction, by estimation, is 60 to 65%. The  left ventricle has normal function. Left ventricular endocardial border  not optimally defined to evaluate regional wall motion. The left  ventricular internal cavity size was mildly  dilated. There is mild left ventricular hypertrophy. Left ventricular  diastolic parameters are consistent with Grade I diastolic dysfunction  (impaired relaxation). Elevated left atrial pressure.   2. Right ventricular systolic function is normal. The right ventricular  size is not well visualized.   3. The mitral valve is abnormal. Trivial mitral valve regurgitation. Mild  mitral stenosis. The mean mitral valve gradient is 5.0 mmHg.   4. Tricuspid valve regurgitation not well assessed.   5. The aortic valve has an indeterminant number of cusps. There is mild  calcification of the aortic valve. There is moderate thickening of the  aortic valve. Aortic valve regurgitation is trivial. Mild to moderate  aortic valve sclerosis/calcification is  present, without any evidence of aortic stenosis.  _____________   Patient Profile     75 y.o. male with a hx of  pulmonary hypertension, chronic diastolic CHF, CAD, pulmonary fibrosis s/p wedge resection, obesity, COPD on home oxygen, OSA on CPAP, DM2, CKD, renal  artery stenosis, and who is being seen today for the evaluation of chest pain and elevated HS Tn.   Assessment & Plan    1.  Acute on chronic HFpEF/PAH:  Admitted w/ chest tightness and dyspnea Volume overloaded.  Echo w/ nl LV fxn, GrI DD.  No significant valvular dzs.  Has responded well to IV diuresis.  Minus 530 overnight and minus 4.9L since admission.  Wt 119.3kg this AM.  Lowest recent wt was 117.9kg on 6/6.  On exam he still has mild lower ext edema.  Body habitus otw makes exam challenging.  BUN/Creat rising w/ diuresis, and now 70/2.99 this AM (baseline ~ 35-40/2.5-2.75).  He would like to go home this afternoon if at all possible.  Will give him his scheduled PM dose of lasix now and assess response.  He denies prior dietary indiscretions, thus he'll need a higher dose of lasix @ d/c.  Was prev taking 160mg  daily in the AM.  Suspect he'll benefit from 160 in the AM and an additional 40mg  daily in the afternoon.  Will need close outpt f/u w/ bmet in a week.  2.  CAD/NSTEMI:  peak HsTrop of 2129.  Cath in Feb w/ mod LAD and RCA dzs and occluded LCX w/ R  L and L  L collaterals  He has been medically managed since.  Admitted w/ chest pain and dyspnea.  As above, diuresed well.  No chest pain.  Cont asa, plavix, ? blocker, statin, and nitrate.  No plan for cath in setting of CKD IV.  3.  Essential HTN:  BP variable - 107 to 150, but mostly controlled.  Cont current meds.  4.  HL: Cont statin.  LDL 69 in June.  5.  CKD III-IV:  W/ AKI in setting of diuresis this admission.  6.  ILD/Pulm fibrosis:  steroids/nebs per IM.  7.  OSA:  CPAP.  Signed, Murray Hodgkins, NP  04/21/2021, 12:07 PM    For questions or updates, please contact   Please consult [email or web address] for contact info under Cardiology/STEMI.

## 2021-04-21 NOTE — Progress Notes (Signed)
ANTICOAGULATION CONSULT NOTE  Pharmacy Consult for Heparin Indication: chest pain/ACS  Allergies  Allergen Reactions   Calcium Channel Blockers     Would avoid if possible due to h/o peripheral edema    Patient Measurements: Height: _0  (165.1 cm) Weight: 119.3 kg (263 lb 0.1 oz) IBW/kg (Calculated) : 61.5 Heparin Dosing Weight: 90 kg   Vital Signs: Temp: 97.9 F (36.6 C) (10/20 0406) Temp Source: Oral (10/20 0406) BP: 123/51 (10/20 0406) Pulse Rate: 60 (10/20 0209)  Labs: Recent Labs    04/18/21 1240 04/18/21 1441 04/19/21 1130 04/19/21 1317 04/19/21 1416 04/20/21 0541 04/21/21 0619  HGB  --    < >  --  12.0*  --  11.1* 10.7*  HCT  --    < >  --  37.5*  --  35.1* 32.4*  PLT  --    < >  --  205  --  192 175  HEPARINUNFRC  --    < > 0.56  --   --  0.59 0.94*  CREATININE  --    < >  --   --  2.89* 2.68* 2.99*  TROPONINIHS 1,842*  --   --   --   --   --   --    < > = values in this interval not displayed.    Estimated Creatinine Clearance: 26.3 mL/min (A) (by C-G formula based on SCr of 2.99 mg/dL (H)).   Medical History: Past Medical History:  Diagnosis Date   Adrenal gland anomaly    Arthritis    CAD (coronary artery disease)    a. 04/2006 MI and PCI/stenting to mLCx & mRCA; b. 07/2009 Cath: patent LCX/RCA stents;  c. 12/2016 NSTEMI/Cath: LM 30, LAD 30p, 73m 30d, D1/2/3 min irregs, LCX 1057mSR, OM1 min irregs, RCA 20p, 10 ISR, 7077m0d, RPDA min irregs, RPAV 40-->Med Rx; d. 12/2017 MV: fixed lateral wall scar, mild anterior/anterior septal ischemia. EF 30-44%.   CHF (congestive heart failure) (HCC)    CKD (chronic kidney disease), stage III (HCC)    Colon polyps    DM2 (diabetes mellitus, type 2) (HCC)    insulin requiring   Dyslipidemia    GERD (gastroesophageal reflux disease)    History of echocardiogram    a. TTE 12/2016: EF 50-55%, mod concentric LVH, images inadequate for wall motion assessment, not technically sufficienct to allow for LV dias fxn,  calcified mitral annulus    HTN (hypertension)    Kidney stones    [place] Renal artery stenosis ([place]  a. 07/2013 s/p PTA/stenting ([place]); b. 07/2017 Renal Duplex: No significant RAS.   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   Recurrent UTI    Rotator cuff disorder    right   Skin cancer    head   Thyroid disorder    Venous insufficiency of both lower extremities    a. s/p laser treatment.    Medications:  Medications Prior to Admission  Medication Sig Dispense Refill Last Dose   acetaminophen (TYLENOL) 500 MG tablet Take 1,000 mg by mouth every 6 (six) hours as needed.   prn at prn   albuterol (VENTOLIN HFA) 108 (90 Base) MCG/ACT inhaler INHALE 2 PUFFS INTO THE LUNGS EVERY 6 HOURS AS NEEDED FOR WHEEZING OR SHORTNESS OF BREATH. DISPENSE 3 INHALERS 27 g 0 prn at prn  aspirin EC 81 MG tablet Take 1 tablet (81 mg total) by mouth daily. Swallow whole.   04/17/2021 at 0900   atorvastatin (LIPITOR) 80 MG tablet TAKE 1 TABLET AT BEDTIME 90 tablet 1 04/16/2021 at 2100   calcitRIOL (ROCALTROL) 0.25 MCG capsule Take 0.25 mcg by mouth daily.   04/17/2021 at 0900   carvedilol (COREG) 12.5 MG tablet TAKE 1 TABLET TWO TIMES DAILY WITH MEALS 180 tablet 1 04/17/2021 at 0900   cloNIDine (CATAPRES) 0.3 MG tablet TAKE 1 TABLET THREE TIMES DAILY 270 tablet 0 04/17/2021 at 1300   clopidogrel (PLAVIX) 75 MG tablet TAKE 1 TABLET EVERY DAY 90 tablet 2 04/17/2021 at 0900   dorzolamide-timolol (COSOPT) 22.3-6.8 MG/ML ophthalmic solution Place 1 drop into both eyes in the morning and at bedtime.   04/17/2021 at 0900   ezetimibe (ZETIA) 10 MG tablet TAKE 1 TABLET EVERY DAY 90 tablet 2 04/17/2021 at 0900   fluticasone (FLONASE) 50 MCG/ACT nasal spray Place 2 sprays into both nostrils daily. 16 g 6 prn at prn   Fluticasone-Umeclidin-Vilant (TRELEGY ELLIPTA) 200-62.5-25  MCG/INH AEPB Inhale 1 puff into the lungs daily. 60 each 0 04/17/2021 at 0900   furosemide (LASIX) 80 MG tablet Take 160 mg by mouth daily.   04/17/2021 at 1300   gabapentin (NEURONTIN) 400 MG capsule TAKE 2 CAPSULES IN THE MORNING  AND TAKE 3 CAPSULES EVERY NIGHT 450 capsule 3 04/17/2021 at 0900   hydrALAZINE (APRESOLINE) 50 MG tablet Take 1 tablet (50 mg total) by mouth 3 (three) times daily. 270 tablet 3 04/17/2021 at 1300   insulin NPH-regular Human (70-30) 100 UNIT/ML injection Inject 40-60 Units into the skin as directed. Take 60 units in the morning and 40 units in the evening   04/17/2021 at 0900   isosorbide mononitrate (IMDUR) 60 MG 24 hr tablet TAKE 1 AND 1/2 TABLETS TWICE DAILY 270 tablet 0 04/17/2021 at 0900   levothyroxine (SYNTHROID) 25 MCG tablet take 2 of the 25 mcg tablets Monday through Saturday with 1 tablet on Sunday.  13 pills/week. 180 tablet 1 04/17/2021 at 0700   montelukast (SINGULAIR) 10 MG tablet Take 1 tablet by mouth once daily 30 tablet 0 04/17/2021 at 0900   nitroGLYCERIN (NITROSTAT) 0.4 MG SL tablet Place 1 tablet (0.4 mg total) under the tongue every 5 (five) minutes as needed. May repeat x3 25 tablet prn prn at prn   omeprazole (PRILOSEC) 20 MG capsule Take 20 mg by mouth daily before breakfast.   04/17/2021 at 0700   potassium chloride (KLOR-CON) 10 MEQ tablet Take 2 tablets (20 meq) by mouth once daily 180 tablet 3 04/17/2021 at 1300   primidone (MYSOLINE) 50 MG tablet Take 0.5 tablets (25 mg total) by mouth in the morning. For tremor 45 tablet 1 04/17/2021 at 0900   sildenafil (REVATIO) 20 MG tablet Take 1 tablet (20 mg total) by mouth 3 (three) times daily. 270 tablet 3 04/17/2021 at 1300   tamsulosin (FLOMAX) 0.4 MG CAPS capsule TAKE 1 CAPSULE AT BEDTIME 90 capsule 1 04/16/2021 at 2100   torsemide (DEMADEX) 20 MG tablet Take 2 tablets (40 mg) by mouth once daily 180 tablet 2 04/17/2021 at 0900   ACCU-CHEK AVIVA PLUS test strip USE AS INSTRUCTED TO CHECK BLOOD  SUGAR 3 TIMES DAILY OR AS NEEDED 300 strip 1    ACCU-CHEK SOFTCLIX LANCETS lancets Use as instructed to check blood sugar three times daily and as needed.  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 100 each 5  Alcohol Swabs (B-D SINGLE USE SWABS REGULAR) PADS USE TO CLEANS AREA PRIOR TO CHECKING BLOOD SUGAR THREE TIMES DAILY AND AS NEEDED 400 each 3    Blood Glucose Monitoring Suppl (ACCU-CHEK AVIVA PLUS) w/Device KIT Use to check blood sugar three times daily and as needed.  Diagnosis: E11.22  Insulin-dependent 1 kit 0    diphenhydrAMINE (BENADRYL) 50 MG tablet Take 50 mg by mouth at bedtime as needed for allergies. (Patient not taking: Reported on 04/18/2021)   Not Taking   DROPLET INSULIN SYRINGE 30G X 5/16" 1 ML MISC USE AS DIRECTED TO INJECT INSULIN 200 each 2    HYDROcodone-acetaminophen (NORCO/VICODIN) 5-325 MG tablet Take 1 tablet by mouth every 6 (six) hours as needed for moderate pain. (Patient not taking: Reported on 04/18/2021) 20 tablet 0 Not Taking   Sennosides (EX-LAX) 15 MG CHEW Chew 15 mg by mouth 2 (two) times daily as needed (constipation). (Patient not taking: Reported on 04/18/2021)   Not Taking    Assessment: 75 year old male w/ PMH of moderate persistent asthma, interstitial lung disease, pulmonary hypertension, OSA with chronic respiratory failure on home O2 at 4 L, diastolic heart failure, CAD, DM, CKD IV who was admitted with ACS/NSTEMI.   Pharmacy was consulted for heparin dosing. No prior anticoagulation noted.   Date/time HL Comment, interpretation 10/17 1441 0.15 Subthera, at 1250 units/hr 10/18 0259 0.55 Therapeutic x 1 @ 1550 u/hr 10/18 1130 0.56 Therapeutic x2 10/19  0629     0.59     Therapeutic 10/20  0700     0.94     Supratherapeutic, decreased  Goal of Therapy:  Heparin level 0.3-0.7 units/ml Monitor platelets by anticoagulation protocol: Yes   Plan:   Continue IV heparin for at least 48h per cardiology (will re-assess today) Heparin level  supra-therapeutic this morning. will decrease rate of infusion to heparin infusion at 1350 units/hr Heparin level in 8hr if infusion continues after cardiologist assessment.   Drevin Ortner Rodriguez-Guzman PharmD, BCPS 04/21/2021 7:30 AM

## 2021-04-21 NOTE — TOC Progression Note (Signed)
Transition of Care [facility]) - Progression Note    Patient Details  Name: Stephen [name] MRN: [id number] Date of Birth: September 28, 1945  Transition of Care [facility]) CM/SW [place], [place] Phone Number: 04/21/2021, 3:06 PM  Clinical Narrative:     Patient to discharge tomorrow home with home health. CSW spoke with patient's spouse who is agreeable to home health PT and OT through [facility]. [name] has accepted. She is also agreeable to DME 3in1, to be ordered and delivered to hospital room via adapt. Patient wife will transport him home at discharge. Reports he already has home oxygen via [employer].   No other discharge needs identified at this time.    Expected Discharge Plan: [place] Barriers to Discharge: Continued Medical Work up  Expected Discharge Plan and Services Expected Discharge Plan: [place] In-house Referral: Clinical Social Work   Post Acute Care Choice: [place] arrangements for the past 2 months: Single Family Home                                       Social Determinants of Health (SDOH) Interventions    Readmission Risk Interventions Readmission Risk Prevention Plan 09/20/2018  Transportation Screening Complete  PCP or Specialist Appt within 3-5 Days Complete  HRI or [place] Patient refused  Social Work Consult for [place] Planning/Counseling Patient refused  Palliative Care Screening Not Applicable  Medication Review Press photographer) Complete  Some recent data might be hidden

## 2021-04-21 NOTE — Progress Notes (Signed)
Physical Therapy Treatment Patient Details Name: Stephen [name] MRN: [id number] DOB: September 10, 1945 Today's Date: 04/21/2021   History of Present Illness 75 y.o. male with a hx of  pulmonary hypertension, chronic diastolic CHF, CAD s/p L/R heart cath 2018, R heart cath 6/22,, pulmonary fibrosis s/p wedge resection, obesity, COPD on home oxygen, OSA on CPAP, DM2, CKD, renal artery stenosis, and who is being seen today for the evaluation of chest pain and elevated troponin.    PT Comments    Pt received laying supine in bed and agreeable to PT treatment. Pt able to come to sitting EOB without assistance but requiring additional effort and time. Pt able to stand with 1 UE assistance on counter to improve initial standing balance. Pt ambulated 90 ft in hallway with O2 tank as "assistive device" and demonstrated 1 LOB but able to maintain balance with CGA. Pt will benefit from further skilled PT services to improve balance and endurance for functional activities.    Recommendations for follow up therapy are one component of a multi-disciplinary discharge planning process, led by the attending physician.  Recommendations may be updated based on patient status, additional functional criteria and insurance authorization.  Follow Up Recommendations  Home health PT;Supervision - Intermittent     Equipment Recommendations  None recommended by PT    Recommendations for Other Services       Precautions / Restrictions Precautions Precautions: Sternal Restrictions Weight Bearing Restrictions: No     Mobility  Bed Mobility Overal bed mobility: Modified Independent Bed Mobility: Supine to Sit;Sit to Supine     Supine to sit: Modified independent (Device/Increase time)     General bed mobility comments: able to complete without usage of bed rails; did require usage of momentum and additional time    Transfers Overall transfer level: Needs assistance Equipment used: 1 person hand held  assist;Right platform walker Transfers: Sit to/from Stand Sit to Stand: Min guard         General transfer comment: 1 HHA on counter for balance with initial standing  Ambulation/Gait Ambulation/Gait assistance: Min guard Gait Distance (Feet): 90 Feet Assistive device: 1 person hand held assist (Uses oxygen tank as assistive device) Gait Pattern/deviations: Step-through pattern;Trunk flexed;Wide base of support Gait velocity: decreased   General Gait Details: Able to push O2 tank for steading assistance. Did cross feet once during ambulation but able to maintain balance with CGA. Fatigues after ambulation. O2 >90% after ambulation   Stairs             Wheelchair Mobility    Modified Rankin (Stroke Patients Only)       Balance Overall balance assessment: Needs assistance Sitting-balance support: No upper extremity supported;Feet supported Sitting balance-Leahy Scale: Normal     Standing balance support: Single extremity supported;During functional activity Standing balance-Leahy Scale: Fair Standing balance comment: 1 LOB during ambulation however, able to correct with CGA                            Cognition Arousal/Alertness: Awake/alert Behavior During Therapy: WFL for tasks assessed/performed Overall Cognitive Status: Within Functional Limits for tasks assessed                                        Exercises Other Exercises Other Exercises: Pt negotiated from one side of the bed around to the other using PRN  RUE support on the bed rails/foot board for improved stability/confidence.    General Comments        Pertinent Vitals/Pain Pain Assessment: No/denies pain    Home Living                      Prior Function            PT Goals (current goals can now be found in the care plan section) Acute Rehab PT Goals Patient Stated Goal: go home PT Goal Formulation: With patient Time For Goal Achievement:  05/03/21 Potential to Achieve Goals: Fair Progress towards PT goals: Progressing toward goals    Frequency    Min 2X/week      PT Plan Current plan remains appropriate    Co-evaluation              AM-PAC PT "6 Clicks" Mobility   Outcome Measure  Help needed turning from your back to your side while in a flat bed without using bedrails?: None Help needed moving from lying on your back to sitting on the side of a flat bed without using bedrails?: None Help needed moving to and from a bed to a chair (including a wheelchair)?: A Little Help needed standing up from a chair using your arms (e.g., wheelchair or bedside chair)?: A Little Help needed to walk in hospital room?: A Little Help needed climbing 3-5 steps with a railing? : A Lot 6 Click Score: 19    End of Session Equipment Utilized During Treatment: Gait belt;Oxygen Activity Tolerance: Patient tolerated treatment well;Patient limited by fatigue Patient left: in bed;with call bell/phone within reach;with bed alarm set Nurse Communication: Mobility status PT Visit Diagnosis: Unsteadiness on feet (R26.81);Other abnormalities of gait and mobility (R26.89);Muscle weakness (generalized) (M62.81);Difficulty in walking, not elsewhere classified (R26.2)     Time: 1610-9604 PT Time Calculation (min) (ACUTE ONLY): 23 min  Charges:  $Gait Training: 23-37 mins                     Andrey Campanile, SPT    Andrey Campanile 04/21/2021, 3:08 PM

## 2021-04-21 NOTE — Progress Notes (Signed)
PROGRESS NOTE    Stephen [name]  IRJ:[id number] DOB: 1946/06/16 DOA: 04/17/2021 PCP: [name], MD    Assessment & Plan:   Active Problems:   Type 2 diabetes mellitus with renal complication (HCC)   OSA (obstructive sleep apnea)   CAD (coronary artery disease)   Essential hypertension, benign   Acute on chronic heart failure with preserved ejection fraction (HFpEF) (HCC)   Pulmonary hypertension, primary (HCC)   Acute on chronic respiratory failure with hypoxemia (HCC)   CKD (chronic kidney disease) stage 4, GFR 15-29 ml/min (HCC)   ILD (interstitial lung disease) (HCC)   COPD with acute exacerbation ([place])   NSTEMI: w/ hx of CAD. EF 30-16%, grade I diastolic dysfunction.  Continue on coreg, imdur, plavix, statin & zetia. Continue on IV heparin drip as per cardio. No cardiac cath now secondary to CKD & unable to lay flat due to orthopnea as per cardio    Acute on chronic hypoxic respiratory failure: presenting in acute respiratory distress, speaking in short sentences, requiring BiPAP. Etiology likely secondary to COPD, CHF exacerbation superimposed on chronic pulmon conditions, ILD, PAH. Continue on steroids, lasix, and bronchodilators. Back to baseline of 6L [place]   Acute on chronic diastolic CHF: continue on lasix. Monitor I/Os and daily weights. Continue on coreg, imdur. Neg less than 1 L   HTN emergency: resolved but still w/ HTN. Continue on imdur, hydralazine, coreg   COPD exacerbation: continue on steroids, bronchodilators & encourage incentive spirometry    Pulmon disease: w/ hx of ILD, pulmonary HTN. Continue to hold sildanefil   CKDIV: baseline Cr 2.6. Cr is trending up today. Avoid nephrotoxic meds   DM2: likely poorly controlled. Continue on glargine, SSI w/ accuchecks     DVT prophylaxis: heparin  Code Status: full  Family Communication:  Disposition Plan: likely d/c back home   Level of care: Progressive Cardiac  Status is: Inpatient  Remains  inpatient appropriate because: secondary respiratory status, severity of illness     Consultants:  Cardio   Procedures:   Antimicrobials:    Subjective: Pt c/o malaise   Objective: Vitals:   04/21/21 0212 04/21/21 0406 04/21/21 0752 04/21/21 1147  BP:  (!) 123/51 (!) 151/59 (!) 107/58  Pulse:   (!) 59 60  Resp:  18 18 17   Temp:  97.9 F (36.6 C) 98 F (36.7 C) 98.6 F (37 C)  TempSrc:  Oral Oral Oral  SpO2:  93% 100% 99%  Weight: 119.3 kg     Height:        Intake/Output Summary (Last 24 hours) at 04/21/2021 1249 Last data filed at 04/21/2021 1148 Gross per 24 hour  Intake 1509.72 ml  Output 2150 ml  Net -640.28 ml   Filed Weights   04/18/21 0604 04/20/21 0143 04/21/21 0212  Weight: 120.7 kg 119.8 kg 119.3 kg    Examination:  General exam: Appears comfortable  Respiratory system: decreased breath sounds b/l  Cardiovascular system: S1/S2+. No rubs or clicks. B/l LE edema  Gastrointestinal system: Abd is soft, NT, obese & hypoactive bowel sounds  Central nervous system: Alert and oriented. Moves all extremities  Psychiatry: Judgement and insight appear normal. Flat mood and affect     Data Reviewed: I have personally reviewed following labs and imaging studies  CBC: Recent Labs  Lab 04/17/21 2101 04/19/21 0259 04/19/21 1317 04/20/21 0541 04/21/21 0619  WBC 6.0 11.0* 10.0 11.2* 12.2*  NEUTROABS 4.8  --   --   --   --  HGB 9.9* 13.0 12.0* 11.1* 10.7*  HCT 31.4* 39.4 37.5* 35.1* 32.4*  MCV 92.4 88.1 87.8 87.8 88.3  PLT 153 191 205 192 710   Basic Metabolic Panel: Recent Labs  Lab 04/17/21 2101 04/18/21 0505 04/19/21 0259 04/19/21 1416 04/20/21 0541 04/21/21 0619  NA 141 140 137  --  137 134*  K 4.0 3.8 3.9  --  3.7 3.8  CL 105 106 101  --  102 100  CO2 27 26 25   --  28 29  GLUCOSE 216* 283* 320*  --  287* 302*  BUN 36* 35* 47*  --  67* 70*  CREATININE 2.66* 2.74* 2.80* 2.89* 2.68* 2.99*  CALCIUM 8.3* 8.2* 8.5*  --  8.4* 8.3*    GFR: Estimated Creatinine Clearance: 26.3 mL/min (A) (by C-G formula based on SCr of 2.99 mg/dL (H)). Liver Function Tests: Recent Labs  Lab 04/17/21 2101  AST 22  ALT 17  ALKPHOS 84  BILITOT 0.7  PROT 6.8  ALBUMIN 2.9*   No results for input(s): LIPASE, AMYLASE in the last 168 hours. No results for input(s): AMMONIA in the last 168 hours. Coagulation Profile: Recent Labs  Lab 04/18/21 0623  INR 1.1   Cardiac Enzymes: No results for input(s): CKTOTAL, CKMB, CKMBINDEX, TROPONINI in the last 168 hours. BNP (last 3 results) No results for input(s): PROBNP in the last 8760 hours. HbA1C: No results for input(s): HGBA1C in the last 72 hours. CBG: Recent Labs  Lab 04/20/21 1122 04/20/21 1623 04/20/21 1952 04/21/21 0750 04/21/21 1146  GLUCAP 349* 425* 390* 283* 339*   Lipid Profile: No results for input(s): CHOL, HDL, LDLCALC, TRIG, CHOLHDL, LDLDIRECT in the last 72 hours. Thyroid Function Tests: No results for input(s): TSH, T4TOTAL, FREET4, T3FREE, THYROIDAB in the last 72 hours. Anemia Panel: No results for input(s): VITAMINB12, FOLATE, FERRITIN, TIBC, IRON, RETICCTPCT in the last 72 hours. Sepsis Labs: Recent Labs  Lab 04/17/21 2101 04/18/21 0658  LATICACIDVEN 1.2 0.9    Recent Results (from the past 240 hour(s))  Resp Panel by RT-PCR (Flu A&B, Covid) Nasopharyngeal Swab     Status: None   Collection Time: 04/17/21  8:58 PM   Specimen: Nasopharyngeal Swab; Nasopharyngeal(NP) swabs in vial transport medium  Result Value Ref Range Status   SARS Coronavirus 2 by RT PCR NEGATIVE NEGATIVE Final    Comment: (NOTE) SARS-CoV-2 target nucleic acids are NOT DETECTED.  The SARS-CoV-2 RNA is generally detectable in upper respiratory specimens during the acute phase of infection. The lowest concentration of SARS-CoV-2 viral copies this assay can detect is 138 copies/mL. A negative result does not preclude SARS-Cov-2 infection and should not be used as the sole basis  for treatment or other patient management decisions. A negative result may occur with  improper specimen collection/handling, submission of specimen other than nasopharyngeal swab, presence of viral mutation(s) within the areas targeted by this assay, and inadequate number of viral copies(<138 copies/mL). A negative result must be combined with clinical observations, patient history, and epidemiological information. The expected result is Negative.  Fact Sheet for Patients:  EntrepreneurPulse.com.au  Fact Sheet for Healthcare Providers:  IncredibleEmployment.be  This test is no t yet approved or cleared by the [place] FDA and  has been authorized for detection and/or diagnosis of SARS-CoV-2 by FDA under an Emergency Use Authorization (EUA). This EUA will remain  in effect (meaning this test can be used) for the duration of the COVID-19 declaration under Section 564(b)(1) of the Act, 21 U.S.C.section 360bbb-3(b)(1),  unless the authorization is terminated  or revoked sooner.       Influenza A by PCR NEGATIVE NEGATIVE Final   Influenza B by PCR NEGATIVE NEGATIVE Final    Comment: (NOTE) The Xpert Xpress SARS-CoV-2/FLU/RSV plus assay is intended as an aid in the diagnosis of influenza from Nasopharyngeal swab specimens and should not be used as a sole basis for treatment. Nasal washings and aspirates are unacceptable for Xpert Xpress SARS-CoV-2/FLU/RSV testing.  Fact Sheet for Patients: EntrepreneurPulse.com.au  Fact Sheet for Healthcare Providers: IncredibleEmployment.be  This test is not yet approved or cleared by the [place] FDA and has been authorized for detection and/or diagnosis of SARS-CoV-2 by FDA under an Emergency Use Authorization (EUA). This EUA will remain in effect (meaning this test can be used) for the duration of the COVID-19 declaration under Section 564(b)(1) of the Act, 21  U.S.C. section 360bbb-3(b)(1), unless the authorization is terminated or revoked.  Performed at [facility], [address] [place], [place] [address]          Radiology Studies: No results found.      Scheduled Meds:  aspirin EC  81 mg Oral Daily   atorvastatin  80 mg Oral QHS   calcitRIOL  0.25 mcg Oral Daily   carvedilol  12.5 mg Oral BID WC   cloNIDine  0.3 mg Oral TID   clopidogrel  75 mg Oral Daily   dorzolamide-timolol  1 drop Both Eyes BID   ezetimibe  10 mg Oral Daily   fluticasone furoate-vilanterol  1 puff Inhalation Daily   furosemide  40 mg Intravenous BID   gabapentin  1,200 mg Oral QHS   gabapentin  800 mg Oral Daily   hydrALAZINE  100 mg Oral TID   insulin aspart  0-15 Units Subcutaneous TID WC   insulin aspart  0-5 Units Subcutaneous QHS   insulin aspart  8 Units Subcutaneous TID WC   insulin glargine-yfgn  35 Units Subcutaneous QHS   ipratropium-albuterol  3 mL Nebulization Q6H   isosorbide mononitrate  120 mg Oral Daily   [START ON 04/24/2021] levothyroxine  25 mcg Oral Weekly   levothyroxine  50 mcg Oral Once per day on Mon Tue Wed Thu Fri Sat   montelukast  10 mg Oral Daily   multivitamin with minerals  1 tablet Oral Daily   pantoprazole  40 mg Oral Daily   potassium chloride  20 mEq Oral Daily   predniSONE  40 mg Oral Q breakfast   Ensure Max Protein  11 oz Oral BID   sodium chloride flush  3 mL Intravenous Q12H   tamsulosin  0.4 mg Oral QHS   Continuous Infusions:  sodium chloride     heparin 1,350 Units/hr (04/21/21 0835)     LOS: 4 days    Time spent: 33 mins     Wyvonnia Dusky, MD [facility] Pager [phone]  If 7PM-7AM, please contact night-coverage 04/21/2021, 12:49 PM

## 2021-04-21 NOTE — Progress Notes (Signed)
ANTICOAGULATION CONSULT NOTE  Pharmacy Consult for Heparin Indication: chest pain/ACS  Allergies  Allergen Reactions   Calcium Channel Blockers     Would avoid if possible due to h/o peripheral edema    Patient Measurements: Height: _0  (165.1 cm) Weight: 119.3 kg (263 lb 0.1 oz) IBW/kg (Calculated) : 61.5 Heparin Dosing Weight: 90 kg   Vital Signs: Temp: 98.5 F (36.9 C) (10/20 1548) Temp Source: Oral (10/20 1548) BP: 141/53 (10/20 1548) Pulse Rate: 61 (10/20 1548)  Labs: Recent Labs    04/19/21 1317 04/19/21 1416 04/20/21 0541 04/21/21 0619 04/21/21 1558  HGB 12.0*  --  11.1* 10.7*  --   HCT 37.5*  --  35.1* 32.4*  --   PLT 205  --  192 175  --   HEPARINUNFRC  --   --  0.59 0.94* 0.46  CREATININE  --  2.89* 2.68* 2.99*  --     Estimated Creatinine Clearance: 26.3 mL/min (A) (by C-G formula based on SCr of 2.99 mg/dL (H)).   Medical History: Past Medical History:  Diagnosis Date   Adrenal gland anomaly    Arthritis    CAD (coronary artery disease)    a. 04/2006 MI and PCI/stenting to mLCx & mRCA; b. 07/2009 Cath: patent LCX/RCA stents;  c. 12/2016 NSTEMI/Cath: LM 30, LAD 30p, 54m 30d, D1/2/3 min irregs, LCX 1011mSR, OM1 min irregs, RCA 20p, 10 ISR, 7074m0d, RPDA min irregs, RPAV 40-->Med Rx; d. 12/2017 MV: fixed lateral wall scar, mild anterior/anterior septal ischemia. EF 30-44%.   CHF (congestive heart failure) (HCC)    CKD (chronic kidney disease), stage III (HCC)    Colon polyps    DM2 (diabetes mellitus, type 2) (HCC)    insulin requiring   Dyslipidemia    GERD (gastroesophageal reflux disease)    History of echocardiogram    a. TTE 12/2016: EF 50-55%, mod concentric LVH, images inadequate for wall motion assessment, not technically sufficienct to allow for LV dias fxn, calcified mitral annulus    HTN (hypertension)    Kidney stones    Left Renal artery stenosis ([facility]  a. 07/2013 s/p PTA/stenting ([place]); b. 07/2017 Renal Duplex: No significant RAS.    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   Recurrent UTI    Rotator cuff disorder    right   Skin cancer    head   Thyroid disorder    Venous insufficiency of both lower extremities    a. s/p laser treatment.    Medications:  Medications Prior to Admission  Medication Sig Dispense Refill Last Dose   acetaminophen (TYLENOL) 500 MG tablet Take 1,000 mg by mouth every 6 (six) hours as needed.   prn at prn   albuterol (VENTOLIN HFA) 108 (90 Base) MCG/ACT inhaler INHALE 2 PUFFS INTO THE LUNGS EVERY 6 HOURS AS NEEDED FOR WHEEZING OR SHORTNESS OF BREATH. DISPENSE 3 INHALERS 27 g 0 prn at prn   aspirin EC 81 MG tablet Take 1 tablet (81 mg total) by mouth daily. Swallow whole.   04/17/2021 at 0900   atorvastatin (LIPITOR) 80 MG tablet TAKE 1 TABLET AT BEDTIME 90 tablet 1 04/16/2021 at 2100   calcitRIOL (ROCALTROL) 0.25 MCG capsule Take 0.25 mcg by mouth daily.   04/17/2021 at 0900   carvedilol (COREG) 12.5 MG tablet TAKE 1 TABLET TWO TIMES  DAILY WITH MEALS 180 tablet 1 04/17/2021 at 0900   cloNIDine (CATAPRES) 0.3 MG tablet TAKE 1 TABLET THREE TIMES DAILY 270 tablet 0 04/17/2021 at 1300   clopidogrel (PLAVIX) 75 MG tablet TAKE 1 TABLET EVERY DAY 90 tablet 2 04/17/2021 at 0900   dorzolamide-timolol (COSOPT) 22.3-6.8 MG/ML ophthalmic solution Place 1 drop into both eyes in the morning and at bedtime.   04/17/2021 at 0900   ezetimibe (ZETIA) 10 MG tablet TAKE 1 TABLET EVERY DAY 90 tablet 2 04/17/2021 at 0900   fluticasone (FLONASE) 50 MCG/ACT nasal spray Place 2 sprays into both nostrils daily. 16 g 6 prn at prn   Fluticasone-Umeclidin-Vilant (TRELEGY ELLIPTA) 200-62.5-25 MCG/INH AEPB Inhale 1 puff into the lungs daily. 60 each 0 04/17/2021 at 0900   furosemide (LASIX) 80 MG tablet Take 160 mg by mouth daily.   04/17/2021 at 1300   gabapentin (NEURONTIN)  400 MG capsule TAKE 2 CAPSULES IN THE MORNING  AND TAKE 3 CAPSULES EVERY NIGHT 450 capsule 3 04/17/2021 at 0900   hydrALAZINE (APRESOLINE) 50 MG tablet Take 1 tablet (50 mg total) by mouth 3 (three) times daily. 270 tablet 3 04/17/2021 at 1300   insulin NPH-regular Human (70-30) 100 UNIT/ML injection Inject 40-60 Units into the skin as directed. Take 60 units in the morning and 40 units in the evening   04/17/2021 at 0900   isosorbide mononitrate (IMDUR) 60 MG 24 hr tablet TAKE 1 AND 1/2 TABLETS TWICE DAILY 270 tablet 0 04/17/2021 at 0900   levothyroxine (SYNTHROID) 25 MCG tablet take 2 of the 25 mcg tablets Monday through Saturday with 1 tablet on Sunday.  13 pills/week. 180 tablet 1 04/17/2021 at 0700   montelukast (SINGULAIR) 10 MG tablet Take 1 tablet by mouth once daily 30 tablet 0 04/17/2021 at 0900   nitroGLYCERIN (NITROSTAT) 0.4 MG SL tablet Place 1 tablet (0.4 mg total) under the tongue every 5 (five) minutes as needed. May repeat x3 25 tablet prn prn at prn   omeprazole (PRILOSEC) 20 MG capsule Take 20 mg by mouth daily before breakfast.   04/17/2021 at 0700   potassium chloride (KLOR-CON) 10 MEQ tablet Take 2 tablets (20 meq) by mouth once daily 180 tablet 3 04/17/2021 at 1300   primidone (MYSOLINE) 50 MG tablet Take 0.5 tablets (25 mg total) by mouth in the morning. For tremor 45 tablet 1 04/17/2021 at 0900   sildenafil (REVATIO) 20 MG tablet Take 1 tablet (20 mg total) by mouth 3 (three) times daily. 270 tablet 3 04/17/2021 at 1300   tamsulosin (FLOMAX) 0.4 MG CAPS capsule TAKE 1 CAPSULE AT BEDTIME 90 capsule 1 04/16/2021 at 2100   torsemide (DEMADEX) 20 MG tablet Take 2 tablets (40 mg) by mouth once daily 180 tablet 2 04/17/2021 at 0900   ACCU-CHEK AVIVA PLUS test strip USE AS INSTRUCTED TO CHECK BLOOD SUGAR 3 TIMES DAILY OR AS NEEDED 300 strip 1    ACCU-CHEK SOFTCLIX LANCETS lancets Use as instructed to check blood sugar three times daily and as needed.  Diagnosis: E11.22   Insulin-dependent. 100 each 5    Alcohol Swabs (B-D SINGLE USE SWABS REGULAR) PADS USE TO CLEANS AREA PRIOR TO CHECKING BLOOD SUGAR THREE TIMES DAILY AND AS NEEDED 400 each 3    Blood Glucose Monitoring Suppl (ACCU-CHEK AVIVA PLUS) w/Device KIT Use to check blood sugar three times daily and as needed.  Diagnosis: E11.22  Insulin-dependent 1 kit 0    diphenhydrAMINE (BENADRYL) 50 MG tablet Take 50 mg by  mouth at bedtime as needed for allergies. (Patient not taking: Reported on 04/18/2021)   Not Taking   DROPLET INSULIN SYRINGE 30G X 5/16" 1 ML MISC USE AS DIRECTED TO INJECT INSULIN 200 each 2    HYDROcodone-acetaminophen (NORCO/VICODIN) 5-325 MG tablet Take 1 tablet by mouth every 6 (six) hours as needed for moderate pain. (Patient not taking: Reported on 04/18/2021) 20 tablet 0 Not Taking   Sennosides (EX-LAX) 15 MG CHEW Chew 15 mg by mouth 2 (two) times daily as needed (constipation). (Patient not taking: Reported on 04/18/2021)   Not Taking    Assessment: 75 year old male w/ PMH of moderate persistent asthma, interstitial lung disease, pulmonary hypertension, OSA with chronic respiratory failure on home O2 at 4 L, diastolic heart failure, CAD, DM, CKD IV who was admitted with ACS/NSTEMI.   Pharmacy was consulted for heparin dosing. No prior anticoagulation noted.   Date/time HL Comment, interpretation 10/17 1441 0.15 Subthera, at 1250 units/hr 10/18 0259 0.55 Therapeutic x 1 @ 1550 u/hr 10/18 1130 0.56 Therapeutic x2 10/19  0629     0.59     Therapeutic 10/20  0700     0.94     Supratherapeutic, decreased 10/20 1558 0.46 Therapeutic.   Goal of Therapy:  Heparin level 0.3-0.7 units/ml Monitor platelets by anticoagulation protocol: Yes   Plan:  Heparin level is therapeutic. Will continue heparin infusion at 1350 units/hr. Recheck heparin level in 8 hours. CBC daily while on heparin.   Eleonore Chiquito PharmD, BCPS 04/21/2021 4:31 PM

## 2021-04-22 ENCOUNTER — Inpatient Hospital Stay: Payer: Medicare HMO

## 2021-04-22 DIAGNOSIS — J9621 Acute and chronic respiratory failure with hypoxia: Secondary | ICD-10-CM | POA: Diagnosis not present

## 2021-04-22 DIAGNOSIS — G9341 Metabolic encephalopathy: Secondary | ICD-10-CM

## 2021-04-22 DIAGNOSIS — I214 Non-ST elevation (NSTEMI) myocardial infarction: Secondary | ICD-10-CM | POA: Diagnosis not present

## 2021-04-22 LAB — COMPREHENSIVE METABOLIC PANEL
ALT: 26 U/L (ref 0–44)
AST: 20 U/L (ref 15–41)
Albumin: 2.5 g/dL — ABNORMAL LOW (ref 3.5–5.0)
Alkaline Phosphatase: 51 U/L (ref 38–126)
Anion gap: 9 (ref 5–15)
BUN: 78 mg/dL — ABNORMAL HIGH (ref 8–23)
CO2: 28 mmol/L (ref 22–32)
Calcium: 8.5 mg/dL — ABNORMAL LOW (ref 8.9–10.3)
Chloride: 99 mmol/L (ref 98–111)
Creatinine, Ser: 3.27 mg/dL — ABNORMAL HIGH (ref 0.61–1.24)
GFR, Estimated: 19 mL/min — ABNORMAL LOW (ref >60)
Glucose, Bld: 293 mg/dL — ABNORMAL HIGH (ref 70–99)
Potassium: 4.6 mmol/L (ref 3.5–5.1)
Sodium: 136 mmol/L (ref 135–145)
Total Bilirubin: 0.6 mg/dL (ref 0.3–1.2)
Total Protein: 6.1 g/dL — ABNORMAL LOW (ref 6.5–8.1)

## 2021-04-22 LAB — BLOOD GAS, ARTERIAL
Acid-Base Excess: 8.4 mmol/L — ABNORMAL HIGH (ref 0.0–2.0)
Acid-Base Excess: 3.6 mmol/L — ABNORMAL HIGH (ref 0.0–2.0)
Bicarbonate: 34.4 mmol/L — ABNORMAL HIGH (ref 20.0–28.0)
Bicarbonate: 29.9 mmol/L — ABNORMAL HIGH (ref 20.0–28.0)
Delivery systems: POSITIVE
Expiratory PAP: 6
FIO2: 0.44
FIO2: 45
Inspiratory PAP: 14
O2 Saturation: 95.6 %
O2 Saturation: 99.1 %
Patient temperature: 37
Patient temperature: 37
RATE: 12 resp/min
pCO2 arterial: 53 mmHg — ABNORMAL HIGH (ref 32.0–48.0)
pCO2 arterial: 53 mmHg — ABNORMAL HIGH (ref 32.0–48.0)
pH, Arterial: 7.42 (ref 7.350–7.450)
pH, Arterial: 7.36 (ref 7.350–7.450)
pO2, Arterial: 78 mmHg — ABNORMAL LOW (ref 83.0–108.0)
pO2, Arterial: 140 mmHg — ABNORMAL HIGH (ref 83.0–108.0)

## 2021-04-22 LAB — AMMONIA: Ammonia: 22 umol/L (ref 9–35)

## 2021-04-22 LAB — URINALYSIS, ROUTINE W REFLEX MICROSCOPIC
Bilirubin Urine: NEGATIVE
Glucose, UA: >=500 mg/dL — AB
Hgb urine dipstick: NEGATIVE
Ketones, ur: NEGATIVE mg/dL
Leukocytes,Ua: NEGATIVE
Nitrite: NEGATIVE
Protein, ur: 100 mg/dL — AB
Specific Gravity, Urine: 1.013 (ref 1.005–1.030)
Squamous Epithelial / HPF: NONE SEEN (ref 0–5)
pH: 5 (ref 5.0–8.0)

## 2021-04-22 LAB — BASIC METABOLIC PANEL
Anion gap: 5 (ref 5–15)
BUN: 72 mg/dL — ABNORMAL HIGH (ref 8–23)
CO2: 29 mmol/L (ref 22–32)
Calcium: 8.5 mg/dL — ABNORMAL LOW (ref 8.9–10.3)
Chloride: 101 mmol/L (ref 98–111)
Creatinine, Ser: 3.1 mg/dL — ABNORMAL HIGH (ref 0.61–1.24)
GFR, Estimated: 20 mL/min — ABNORMAL LOW (ref >60)
Glucose, Bld: 365 mg/dL — ABNORMAL HIGH (ref 70–99)
Potassium: 4 mmol/L (ref 3.5–5.1)
Sodium: 135 mmol/L (ref 135–145)

## 2021-04-22 LAB — CBC WITH DIFFERENTIAL/PLATELET
Abs Immature Granulocytes: 0.13 10*3/uL — ABNORMAL HIGH (ref 0.00–0.07)
Basophils Absolute: 0 10*3/uL (ref 0.0–0.1)
Basophils Relative: 0 %
Eosinophils Absolute: 0 10*3/uL (ref 0.0–0.5)
Eosinophils Relative: 0 %
HCT: 30.9 % — ABNORMAL LOW (ref 39.0–52.0)
Hemoglobin: 9.9 g/dL — ABNORMAL LOW (ref 13.0–17.0)
Immature Granulocytes: 1 %
Lymphocytes Relative: 2 %
Lymphs Abs: 0.3 10*3/uL — ABNORMAL LOW (ref 0.7–4.0)
MCH: 28 pg (ref 26.0–34.0)
MCHC: 32 g/dL (ref 30.0–36.0)
MCV: 87.5 fL (ref 80.0–100.0)
Monocytes Absolute: 0.6 10*3/uL (ref 0.1–1.0)
Monocytes Relative: 4 %
Neutro Abs: 13.7 10*3/uL — ABNORMAL HIGH (ref 1.7–7.7)
Neutrophils Relative %: 93 %
Platelets: 151 10*3/uL (ref 150–400)
RBC: 3.53 MIL/uL — ABNORMAL LOW (ref 4.22–5.81)
RDW: 15.4 % (ref 11.5–15.5)
WBC: 14.8 10*3/uL — ABNORMAL HIGH (ref 4.0–10.5)
nRBC: 0 % (ref 0.0–0.2)

## 2021-04-22 LAB — PROCALCITONIN: Procalcitonin: 0.22 ng/mL

## 2021-04-22 LAB — GLUCOSE, CAPILLARY
Glucose-Capillary: 298 mg/dL — ABNORMAL HIGH (ref 70–99)
Glucose-Capillary: 289 mg/dL — ABNORMAL HIGH (ref 70–99)
Glucose-Capillary: 320 mg/dL — ABNORMAL HIGH (ref 70–99)
Glucose-Capillary: 269 mg/dL — ABNORMAL HIGH (ref 70–99)

## 2021-04-22 LAB — CBC
HCT: 31.1 % — ABNORMAL LOW (ref 39.0–52.0)
Hemoglobin: 10.3 g/dL — ABNORMAL LOW (ref 13.0–17.0)
MCH: 29.3 pg (ref 26.0–34.0)
MCHC: 33.1 g/dL (ref 30.0–36.0)
MCV: 88.6 fL (ref 80.0–100.0)
Platelets: 150 10*3/uL (ref 150–400)
RBC: 3.51 MIL/uL — ABNORMAL LOW (ref 4.22–5.81)
RDW: 15.2 % (ref 11.5–15.5)
WBC: 13.3 10*3/uL — ABNORMAL HIGH (ref 4.0–10.5)
nRBC: 0 % (ref 0.0–0.2)

## 2021-04-22 LAB — HEPARIN LEVEL (UNFRACTIONATED)
Heparin Unfractionated: 0.44 IU/mL (ref 0.30–0.70)
Heparin Unfractionated: 0.47 IU/mL (ref 0.30–0.70)

## 2021-04-22 LAB — MAGNESIUM: Magnesium: 2.2 mg/dL (ref 1.7–2.4)

## 2021-04-22 MED ORDER — CARVEDILOL 6.25 MG PO TABS
6.2500 mg | ORAL_TABLET | Freq: Once | ORAL | Status: AC
  Administered 2021-04-22: 6.25 mg via ORAL
  Filled 2021-04-22: qty 1

## 2021-04-22 MED ORDER — CARVEDILOL 12.5 MG PO TABS
12.5000 mg | ORAL_TABLET | Freq: Once | ORAL | Status: AC
  Administered 2021-04-22: 12.5 mg via ORAL
  Filled 2021-04-22: qty 1

## 2021-04-22 MED ORDER — CARVEDILOL 6.25 MG PO TABS
18.7500 mg | ORAL_TABLET | Freq: Two times a day (BID) | ORAL | Status: DC
  Administered 2021-04-22 – 2021-04-26 (×9): 18.75 mg via ORAL
  Filled 2021-04-22 (×10): qty 1

## 2021-04-22 MED ORDER — ENOXAPARIN SODIUM 30 MG/0.3ML IJ SOSY
30.0000 mg | PREFILLED_SYRINGE | INTRAMUSCULAR | Status: DC
  Administered 2021-04-22 – 2021-04-29 (×8): 30 mg via SUBCUTANEOUS
  Filled 2021-04-22 (×8): qty 0.3

## 2021-04-22 NOTE — Progress Notes (Signed)
Inpatient Diabetes Program Recommendations  AACE/ADA: New Consensus Statement on Inpatient Glycemic Control (2015)  Target Ranges:  Prepandial:   less than 140 mg/dL      Peak postprandial:   less than 180 mg/dL (1-2 hours)      Critically ill patients:  140 - 180 mg/dL   Lab Results  Component Value Date   GLUCAP 298 (H) 04/22/2021   HGBA1C 6.7 (H) 02/21/2021    Review of Glycemic Control  Results for ANGELINO, RUMERY (MRN [id number]) as of 04/22/2021 09:47  Ref. Range 04/21/2021 07:50 04/21/2021 11:46 04/21/2021 16:25 04/21/2021 20:45 04/22/2021 08:22  Glucose-Capillary Latest Ref Range: 70 - 99 mg/dL 283 (H) 339 (H) 386 (H) 392 (H) 298 (H)   Diabetes history: DM 2 Outpatient Diabetes medications: 70/30 60 units qam, 40 units qpm (70 units basal + 30 units meal coverage) Current orders for Inpatient glycemic control:  Semglee 15 units Novolog 4 units tid meal coverage Novolog 0-15 units tid + hs  Inpatient Diabetes Program Recommendations:   Fasting glucose still elevated 298 after increase in Semglee last night Please Consider: -Increase Semglee to 50 units  -Increase Novolog meal coverage to 12 units tid if eats 50%  Thanks, Tama Headings RN, MSN, BC-ADM Inpatient Diabetes Coordinator Team Pager [phone] (8a-5p)

## 2021-04-22 NOTE — Progress Notes (Signed)
PROGRESS NOTE    Stephen [name]  EYC:[id number] DOB: 1945/10/15 DOA: 04/17/2021 PCP: [name], MD    Assessment & Plan:   Active Problems:   Type 2 diabetes mellitus with renal complication (HCC)   OSA (obstructive sleep apnea)   CAD (coronary artery disease)   Essential hypertension, benign   Acute on chronic heart failure with preserved ejection fraction (HFpEF) (HCC)   Pulmonary hypertension, primary (HCC)   Acute on chronic respiratory failure with hypoxemia (HCC)   CKD (chronic kidney disease) stage 4, GFR 15-29 ml/min (HCC)   ILD (interstitial lung disease) (HCC)   COPD with acute exacerbation ([place])   NSTEMI: w/ hx of CAD. EF 14-97%, grade I diastolic dysfunction.  Continue on coreg, imdur, plavix, statin & zetia. Continue on IV heparin drip as per cardio. No cardiac cath now secondary to CKD & unable to lay flat due to orthopnea as per cardio    Acute on chronic hypoxic & hypercapnic respiratory failure: presenting in acute respiratory distress, speaking in short sentences, requiring BiPAP. Will place back on BiPAP today for CO2 retention. Etiology likely secondary to COPD, CHF exacerbation superimposed on chronic pulmon conditions, ILD, PAH. Continue on steroids, lasix, and bronchodilators.   Encephalopathy: etiology unclear, possibly metabolic vs retention of CO2. CT head ordered   Acute on chronic diastolic CHF: holding lasix today as Cr is trending up.  Monitor I/Os and daily weights. Cardio following and recs apprec   HTN emergency: resolved but still w/ HTN. Continue on imdur, hydralazine, coreg   COPD exacerbation: continue on steroids, bronchodilators & encourage incentive spirometry    Pulmon disease: w/ hx of ILD, pulmonary HTN. Continue to hold sildanefil   CKDIV: baseline Cr 2.6. Cr continues to trend up. Hold lasix today.    DM2: likely poorly controlled. Continue on glargine, SSI w/ accuchecks   OSA: uses CPAP qhs at home    DVT prophylaxis:  heparin  Code Status: full  Family Communication: discussed pt's care w/ pt's wife at bedside and answered her questions  Disposition Plan: likely d/c back home   Level of care: Progressive Cardiac  Status is: Inpatient  Remains inpatient appropriate because: secondary respiratory status, severity of illness     Consultants:  Cardio   Procedures:   Antimicrobials:    Subjective: Pt is lethargic   Objective: Vitals:   04/21/21 1957 04/21/21 2354 04/22/21 0441 04/22/21 0500  BP: (!) 140/54 (!) 151/68 (!) 144/58   Pulse: 70 65 65   Resp: 18 18 18    Temp: 98 F (36.7 C) 98.9 F (37.2 C) 98 F (36.7 C)   TempSrc:      SpO2: 100% 99% 99%   Weight:    120.3 kg  Height:        Intake/Output Summary (Last 24 hours) at 04/22/2021 0816 Last data filed at 04/22/2021 0701 Gross per 24 hour  Intake 1821.88 ml  Output 2950 ml  Net -1128.12 ml   Filed Weights   04/20/21 0143 04/21/21 0212 04/22/21 0500  Weight: 119.8 kg 119.3 kg 120.3 kg    Examination:  General exam: Appears lethargic  Respiratory system: diminished breath sounds b/l  Cardiovascular system: S1/S2+. No rubs or clicks  Gastrointestinal system: Abd is soft, NT, obese & hypoactive bowel sounds  Central nervous system: Lethargic. Moves all extremities  Psychiatry: Judgement and insight appear abnormal. Flat mood and affect     Data Reviewed: I have personally reviewed following labs and imaging studies  CBC: Recent Labs  Lab 04/17/21 2101 04/19/21 0259 04/19/21 1317 04/20/21 0541 04/21/21 0619 04/22/21 0513  WBC 6.0 11.0* 10.0 11.2* 12.2* 13.3*  NEUTROABS 4.8  --   --   --   --   --   HGB 9.9* 13.0 12.0* 11.1* 10.7* 10.3*  HCT 31.4* 39.4 37.5* 35.1* 32.4* 31.1*  MCV 92.4 88.1 87.8 87.8 88.3 88.6  PLT 153 191 205 192 175 384   Basic Metabolic Panel: Recent Labs  Lab 04/18/21 0505 04/19/21 0259 04/19/21 1416 04/20/21 0541 04/21/21 0619 04/22/21 0513  NA 140 137  --  137 134* 135   K 3.8 3.9  --  3.7 3.8 4.0  CL 106 101  --  102 100 101  CO2 26 25  --  28 29 29   GLUCOSE 283* 320*  --  287* 302* 365*  BUN 35* 47*  --  67* 70* 72*  CREATININE 2.74* 2.80* 2.89* 2.68* 2.99* 3.10*  CALCIUM 8.2* 8.5*  --  8.4* 8.3* 8.5*   GFR: Estimated Creatinine Clearance: 25.5 mL/min (A) (by C-G formula based on SCr of 3.1 mg/dL (H)). Liver Function Tests: Recent Labs  Lab 04/17/21 2101  AST 22  ALT 17  ALKPHOS 84  BILITOT 0.7  PROT 6.8  ALBUMIN 2.9*   No results for input(s): LIPASE, AMYLASE in the last 168 hours. No results for input(s): AMMONIA in the last 168 hours. Coagulation Profile: Recent Labs  Lab 04/18/21 0623  INR 1.1   Cardiac Enzymes: No results for input(s): CKTOTAL, CKMB, CKMBINDEX, TROPONINI in the last 168 hours. BNP (last 3 results) No results for input(s): PROBNP in the last 8760 hours. HbA1C: No results for input(s): HGBA1C in the last 72 hours. CBG: Recent Labs  Lab 04/20/21 1952 04/21/21 0750 04/21/21 1146 04/21/21 1625 04/21/21 2045  GLUCAP 390* 283* 339* 386* 392*   Lipid Profile: No results for input(s): CHOL, HDL, LDLCALC, TRIG, CHOLHDL, LDLDIRECT in the last 72 hours. Thyroid Function Tests: No results for input(s): TSH, T4TOTAL, FREET4, T3FREE, THYROIDAB in the last 72 hours. Anemia Panel: No results for input(s): VITAMINB12, FOLATE, FERRITIN, TIBC, IRON, RETICCTPCT in the last 72 hours. Sepsis Labs: Recent Labs  Lab 04/17/21 2101 04/18/21 0658  LATICACIDVEN 1.2 0.9    Recent Results (from the past 240 hour(s))  Resp Panel by RT-PCR (Flu A&B, Covid) Nasopharyngeal Swab     Status: None   Collection Time: 04/17/21  8:58 PM   Specimen: Nasopharyngeal Swab; Nasopharyngeal(NP) swabs in vial transport medium  Result Value Ref Range Status   SARS Coronavirus 2 by RT PCR NEGATIVE NEGATIVE Final    Comment: (NOTE) SARS-CoV-2 target nucleic acids are NOT DETECTED.  The SARS-CoV-2 RNA is generally detectable in upper  respiratory specimens during the acute phase of infection. The lowest concentration of SARS-CoV-2 viral copies this assay can detect is 138 copies/mL. A negative result does not preclude SARS-Cov-2 infection and should not be used as the sole basis for treatment or other patient management decisions. A negative result may occur with  improper specimen collection/handling, submission of specimen other than nasopharyngeal swab, presence of viral mutation(s) within the areas targeted by this assay, and inadequate number of viral copies(<138 copies/mL). A negative result must be combined with clinical observations, patient history, and epidemiological information. The expected result is Negative.  Fact Sheet for Patients:  EntrepreneurPulse.com.au  Fact Sheet for Healthcare Providers:  IncredibleEmployment.be  This test is no t yet approved or cleared by the [place] FDA  and  has been authorized for detection and/or diagnosis of SARS-CoV-2 by FDA under an Emergency Use Authorization (EUA). This EUA will remain  in effect (meaning this test can be used) for the duration of the COVID-19 declaration under Section 564(b)(1) of the Act, 21 U.S.C.section 360bbb-3(b)(1), unless the authorization is terminated  or revoked sooner.       Influenza A by PCR NEGATIVE NEGATIVE Final   Influenza B by PCR NEGATIVE NEGATIVE Final    Comment: (NOTE) The Xpert Xpress SARS-CoV-2/FLU/RSV plus assay is intended as an aid in the diagnosis of influenza from Nasopharyngeal swab specimens and should not be used as a sole basis for treatment. Nasal washings and aspirates are unacceptable for Xpert Xpress SARS-CoV-2/FLU/RSV testing.  Fact Sheet for Patients: EntrepreneurPulse.com.au  Fact Sheet for Healthcare Providers: IncredibleEmployment.be  This test is not yet approved or cleared by the [place] FDA and has been  authorized for detection and/or diagnosis of SARS-CoV-2 by FDA under an Emergency Use Authorization (EUA). This EUA will remain in effect (meaning this test can be used) for the duration of the COVID-19 declaration under Section 564(b)(1) of the Act, 21 U.S.C. section 360bbb-3(b)(1), unless the authorization is terminated or revoked.  Performed at [facility], [address] [place], [place] [address]          Radiology Studies: No results found.      Scheduled Meds:  aspirin EC  81 mg Oral Daily   atorvastatin  80 mg Oral QHS   calcitRIOL  0.25 mcg Oral Daily   carvedilol  12.5 mg Oral BID WC   cloNIDine  0.3 mg Oral TID   clopidogrel  75 mg Oral Daily   dorzolamide-timolol  1 drop Both Eyes BID   ezetimibe  10 mg Oral Daily   fluticasone furoate-vilanterol  1 puff Inhalation Daily   gabapentin  400 mg Oral Daily   gabapentin  600 mg Oral QHS   hydrALAZINE  100 mg Oral TID   insulin aspart  0-15 Units Subcutaneous TID WC   insulin aspart  0-5 Units Subcutaneous QHS   insulin aspart  8 Units Subcutaneous TID WC   insulin glargine-yfgn  35 Units Subcutaneous QHS   ipratropium-albuterol  3 mL Nebulization Q6H   isosorbide mononitrate  120 mg Oral Daily   [START ON 04/24/2021] levothyroxine  25 mcg Oral Weekly   levothyroxine  50 mcg Oral Once per day on Mon Tue Wed Thu Fri Sat   montelukast  10 mg Oral Daily   multivitamin with minerals  1 tablet Oral Daily   pantoprazole  40 mg Oral Daily   potassium chloride  20 mEq Oral Daily   predniSONE  40 mg Oral Q breakfast   Ensure Max Protein  11 oz Oral BID   sodium chloride flush  3 mL Intravenous Q12H   tamsulosin  0.4 mg Oral QHS   Continuous Infusions:  sodium chloride       LOS: 5 days    Time spent: 34 mins     Wyvonnia Dusky, MD [facility] Pager [phone]  If 7PM-7AM, please contact night-coverage 04/22/2021, 8:16 AM

## 2021-04-22 NOTE — Progress Notes (Signed)
[facility] Nurse calls and reports patient who was placed on BIPAP earlier during day now having decreased response, but otherwise stable vitals. PMH significant for Moderate persistent asthma, interstitial lung disease, pulmonary hypertension, OSA with chronic respiratory failure on home O2 at 4 L, followed by pulmonology, as well as diastolic heart failure with preserved EF, CAD, DM, CKD IV, COPD who presented initially to the hospital with shortness of breath and found to have NSTEMI, acute on chronic hypoxic and hypercapnic respiratory failure, encephalopathy, acute on chronic CHF, HTN emergency and COPD exacerbation.  BIPAP settings 45%, 14/6 rate of 12, vT550-650 T 99.1, BP 120/50, pulse 60, RR 18  Neuro - PERRLA, minimally responsive to sernal rub at first, did follow commands with all extremities. Attempted verbal response to some questions but was understandable. CT head done earlier this afternoon No acute or focal finding. Atherosclerotic calcification of the major vessels at the base of the brain per report,   Resp - Respirations unlabored. As above good tidal volumes  procal 0.22.   CV  - SB,  regular no ectopy noted, wide qrs, prolonged QT on prior EKG with Qtc 514. SR rate 78, noted coreg significantly increased today from 6.25 to 18.75, lasix held today due to increasing creatinine.   ABG without significant improvement from earlier today. CO2 remains 53, PO2 increase to 140, pH7.36, Hco3 29.9  Bipap settings increase 16/8 decrease Fi02 35% Chest xray EXAM: PORTABLE CHEST 1 VIEW  COMPARISON:  04/19/2021, CT 04/05/2021, 08/14/2018  FINDINGS: Low lung volumes. Cardiomegaly. Underlying pulmonary fibrosis and chronic interstitial lung disease. Improved superimposed ground-glass opacities since 04/05/2021. Aortic atherosclerosis. No pneumothorax.  IMPRESSION: 1. Underlying chronic lung disease though with improved aeration since 04/05/2021 consistent with decreased  superimposed edema or infection 2. Cardiomegaly  Lab discussion CBC leukocytosis worsening 13.3 to 14.8, (steroids) drop in hgb from 10.3 to 9.9  Chem panel shows worsening renal function with creat increase to 3.27 from 3.1;   Procal 0.22  EKG interpreted by me SB  QTc 453, left axis deviation and poor r wave progression  A/P   Encephalopathy - check B12, TSH and cortisol -mental status improving post BIPAP change, review of medications as contributory to decreased mental status includes high doses of neurontin.    2. Acute on chronic resp failure - With continued mental status improvement, wean BIPAP as tolerated, CXR improved from prior   3 AKI on CKD - worsening creatinine. IVF NS at 75 for 1 liter, Avoid nephrotoxic agents, as above neurontin decreased 100 BID  4. UTI - ua indicative of UTI and micro shows many bacteria and budding yeast. Start Rocephin and diflucan  5 Anemia - no active bleeding but did report to me experienced day and a half of  diarrhea, continue to monitor, iron studies in addition to b12 level  6 CHF - Monitor I & O, daily weights - monitor for fluid overload with IVF therapy  Critical care   Performed by: Sharion Settler, NP   Critical care provider statement Critical care time: 60   Critical care was necessary to treat or prevent imminent or life-threatening deterioration of the following conditions: acute respiratory failure and altered mental status   Critical care time was spent personally by me on the following activities: Discussion with consultants, evaluation of patient's response to treatment, re-evaluation of patient's response to treatment, examination of patient, ordering and performing treatments and interventions, ordering and review of laboratory studies, ordering and reviewing of radiographic studies,  pulse oximetry, reevaluation of patient's condition, obtaining history from patient and review of old charts.  Including review of EKG   and previous EKG.

## 2021-04-22 NOTE — Care Management Important Message (Signed)
Important Message  Patient Details  Name: Stephen [name] MRN: [id number] Date of Birth: [birth year]   Medicare Important Message Given:  Yes     Dannette Barbara 04/22/2021, 2:06 PM

## 2021-04-22 NOTE — Progress Notes (Addendum)
ANTICOAGULATION CONSULT NOTE  Pharmacy Consult for Heparin Indication: chest pain/ACS  Allergies  Allergen Reactions   Calcium Channel Blockers     Would avoid if possible due to h/o peripheral edema    Patient Measurements: Height: 5' 5" (165.1 cm) Weight: 120.3 kg (265 lb 3.4 oz) IBW/kg (Calculated) : 61.5 Heparin Dosing Weight: 90 kg   Vital Signs: Temp: 98 F (36.7 C) (10/21 0441) BP: 144/58 (10/21 0441) Pulse Rate: 65 (10/21 0441)  Labs: Recent Labs    04/20/21 0541 04/21/21 0619 04/21/21 1558 04/21/21 2346 04/22/21 0513  HGB 11.1* 10.7*  --   --  10.3*  HCT 35.1* 32.4*  --   --  31.1*  PLT 192 175  --   --  150  HEPARINUNFRC 0.59 0.94* 0.46 0.44 0.47  CREATININE 2.68* 2.99*  --   --  3.10*    Estimated Creatinine Clearance: 25.5 mL/min (A) (by C-G formula based on SCr of 3.1 mg/dL (H)).   Medical History: Past Medical History:  Diagnosis Date   Adrenal gland anomaly    Arthritis    CAD (coronary artery disease)    a. 04/2006 MI and PCI/stenting to mLCx & mRCA; b. 07/2009 Cath: patent LCX/RCA stents;  c. 12/2016 NSTEMI/Cath: LM 30, LAD 30p, 50m, 30d, D1/2/3 min irregs, LCX 100m ISR, OM1 min irregs, RCA 20p, 10 ISR, 70m, 50d, RPDA min irregs, RPAV 40-->Med Rx; d. 12/2017 MV: fixed lateral wall scar, mild anterior/anterior septal ischemia. EF 30-44%.   CHF (congestive heart failure) (HCC)    CKD (chronic kidney disease), stage III (HCC)    Colon polyps    DM2 (diabetes mellitus, type 2) (HCC)    insulin requiring   Dyslipidemia    GERD (gastroesophageal reflux disease)    History of echocardiogram    a. TTE 12/2016: EF 50-55%, mod concentric LVH, images inadequate for wall motion assessment, not technically sufficienct to allow for LV dias fxn, calcified mitral annulus    HTN (hypertension)    Kidney stones    Left Renal artery stenosis (HCC)    a. 07/2013 s/p PTA/stenting ([facility]); b. 07/2017 Renal Duplex: No significant RAS.   Myocardial infarction (HCC)     OSA (obstructive sleep apnea)    Pulmonary fibrosis (HCC)    a. 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   Recurrent UTI    Rotator cuff disorder    right   Skin cancer    head   Thyroid disorder    Venous insufficiency of both lower extremities    a. s/p laser treatment.    Medications:  Medications Prior to Admission  Medication Sig Dispense Refill Last Dose   acetaminophen (TYLENOL) 500 MG tablet Take 1,000 mg by mouth every 6 (six) hours as needed.   prn at prn   albuterol (VENTOLIN HFA) 108 (90 Base) MCG/ACT inhaler INHALE 2 PUFFS INTO THE LUNGS EVERY 6 HOURS AS NEEDED FOR WHEEZING OR SHORTNESS OF BREATH. DISPENSE 3 INHALERS 27 g 0 prn at prn   aspirin EC 81 MG tablet Take 1 tablet (81 mg total) by mouth daily. Swallow whole.   04/17/2021 at 0900   atorvastatin (LIPITOR) 80 MG tablet TAKE 1 TABLET AT BEDTIME 90 tablet 1 04/16/2021 at 2100   calcitRIOL (ROCALTROL) 0.25 MCG capsule Take 0.25 mcg by mouth daily.   04/17/2021 at 0900   carvedilol (COREG) 12.5 MG tablet TAKE 1 TABLET TWO TIMES DAILY WITH MEALS 180 tablet 1 04/17/2021 at 0900     cloNIDine (CATAPRES) 0.3 MG tablet TAKE 1 TABLET THREE TIMES DAILY 270 tablet 0 04/17/2021 at 1300   clopidogrel (PLAVIX) 75 MG tablet TAKE 1 TABLET EVERY DAY 90 tablet 2 04/17/2021 at 0900   dorzolamide-timolol (COSOPT) 22.3-6.8 MG/ML ophthalmic solution Place 1 drop into both eyes in the morning and at bedtime.   04/17/2021 at 0900   ezetimibe (ZETIA) 10 MG tablet TAKE 1 TABLET EVERY DAY 90 tablet 2 04/17/2021 at 0900   fluticasone (FLONASE) 50 MCG/ACT nasal spray Place 2 sprays into both nostrils daily. 16 g 6 prn at prn   Fluticasone-Umeclidin-Vilant (TRELEGY ELLIPTA) 200-62.5-25 MCG/INH AEPB Inhale 1 puff into the lungs daily. 60 each 0 04/17/2021 at 0900   furosemide (LASIX) 80 MG tablet Take 160 mg by mouth daily.   04/17/2021 at 1300   gabapentin (NEURONTIN) 400 MG capsule TAKE 2 CAPSULES IN  THE MORNING  AND TAKE 3 CAPSULES EVERY NIGHT 450 capsule 3 04/17/2021 at 0900   hydrALAZINE (APRESOLINE) 50 MG tablet Take 1 tablet (50 mg total) by mouth 3 (three) times daily. 270 tablet 3 04/17/2021 at 1300   insulin NPH-regular Human (70-30) 100 UNIT/ML injection Inject 40-60 Units into the skin as directed. Take 60 units in the morning and 40 units in the evening   04/17/2021 at 0900   isosorbide mononitrate (IMDUR) 60 MG 24 hr tablet TAKE 1 AND 1/2 TABLETS TWICE DAILY 270 tablet 0 04/17/2021 at 0900   levothyroxine (SYNTHROID) 25 MCG tablet take 2 of the 25 mcg tablets Monday through Saturday with 1 tablet on Sunday.  13 pills/week. 180 tablet 1 04/17/2021 at 0700   montelukast (SINGULAIR) 10 MG tablet Take 1 tablet by mouth once daily 30 tablet 0 04/17/2021 at 0900   nitroGLYCERIN (NITROSTAT) 0.4 MG SL tablet Place 1 tablet (0.4 mg total) under the tongue every 5 (five) minutes as needed. May repeat x3 25 tablet prn prn at prn   omeprazole (PRILOSEC) 20 MG capsule Take 20 mg by mouth daily before breakfast.   04/17/2021 at 0700   potassium chloride (KLOR-CON) 10 MEQ tablet Take 2 tablets (20 meq) by mouth once daily 180 tablet 3 04/17/2021 at 1300   primidone (MYSOLINE) 50 MG tablet Take 0.5 tablets (25 mg total) by mouth in the morning. For tremor 45 tablet 1 04/17/2021 at 0900   sildenafil (REVATIO) 20 MG tablet Take 1 tablet (20 mg total) by mouth 3 (three) times daily. 270 tablet 3 04/17/2021 at 1300   tamsulosin (FLOMAX) 0.4 MG CAPS capsule TAKE 1 CAPSULE AT BEDTIME 90 capsule 1 04/16/2021 at 2100   torsemide (DEMADEX) 20 MG tablet Take 2 tablets (40 mg) by mouth once daily 180 tablet 2 04/17/2021 at 0900   ACCU-CHEK AVIVA PLUS test strip USE AS INSTRUCTED TO CHECK BLOOD SUGAR 3 TIMES DAILY OR AS NEEDED 300 strip 1    ACCU-CHEK SOFTCLIX LANCETS lancets Use as instructed to check blood sugar three times daily and as needed.  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 100 each 5    Alcohol Swabs  (B-D SINGLE USE SWABS REGULAR) PADS USE TO CLEANS AREA PRIOR TO CHECKING BLOOD SUGAR THREE TIMES DAILY AND AS NEEDED 400 each 3    Blood Glucose Monitoring Suppl (ACCU-CHEK AVIVA PLUS) w/Device KIT Use to check blood sugar three times daily and as needed.  Diagnosis: E11.22  Insulin-dependent 1 kit 0    diphenhydrAMINE (BENADRYL) 50 MG tablet Take 50 mg by mouth at bedtime as needed for allergies. (Patient not taking: Reported  on 04/18/2021)   Not Taking   DROPLET INSULIN SYRINGE 30G X 5/16" 1 ML MISC USE AS DIRECTED TO INJECT INSULIN 200 each 2    HYDROcodone-acetaminophen (NORCO/VICODIN) 5-325 MG tablet Take 1 tablet by mouth every 6 (six) hours as needed for moderate pain. (Patient not taking: Reported on 04/18/2021) 20 tablet 0 Not Taking   Sennosides (EX-LAX) 15 MG CHEW Chew 15 mg by mouth 2 (two) times daily as needed (constipation). (Patient not taking: Reported on 04/18/2021)   Not Taking    Assessment: 75 year old male w/ PMH of moderate persistent asthma, interstitial lung disease, pulmonary hypertension, OSA with chronic respiratory failure on home O2 at 4 L, diastolic heart failure, CAD, DM, CKD IV who was admitted with ACS/NSTEMI.   Pharmacy was consulted for heparin dosing. No prior anticoagulation noted.   Date/time HL Comment, interpretation 10/17 1441 0.15 Subthera, at 1250 units/hr 10/18 0259 0.55 Therapeutic x 1 @ 1550 u/hr 10/18 1130 0.56 Therapeutic x2 10/19  0629     0.59     Therapeutic 10/20  0700     0.94     Supratherapeutic, decreased 10/20 1558 0.46 Therapeutic.  10/20 2346 0.44 Therapeutic 10/21  0559     0.47     therapeutic  Goal of Therapy:  Heparin level 0.3-0.7 units/ml Monitor platelets by anticoagulation protocol: Yes   Plan:  Heparin level remains therapeutic. Will continue heparin infusion at 1350 units/hr. Hgb and Plt stable Recheck heparin level daily with AM labs while therapeutic on heparin. CBC daily while on heparin.     Rodriguez-Guzman PharmD, BCPS 04/22/2021 7:37 AM      

## 2021-04-22 NOTE — Progress Notes (Signed)
PHARMACIST - PHYSICIAN COMMUNICATION  CONCERNING:  Enoxaparin (Lovenox) for DVT Prophylaxis    RECOMMENDATION: Patient was prescribed enoxaprin 40mg  q24 hours for VTE prophylaxis.   Filed Weights   04/20/21 0143 04/21/21 0212 04/22/21 0500  Weight: 119.8 kg (264 lb 1.8 oz) 119.3 kg (263 lb 0.1 oz) 120.3 kg (265 lb 3.4 oz)    Body mass index is 44.13 kg/m.  Estimated Creatinine Clearance: 25.5 mL/min (A) (by C-G formula based on SCr of 3.1 mg/dL (H)).   Patient is candidate for enoxaparin 30mg  every 24 hours based on CrCl <47ml/min   DESCRIPTION: Pharmacy has adjusted enoxaparin dose per [facility] policy.  Patient is now receiving enoxaparin 30 mg every 24 hours    Rayon Mcchristian Rodriguez-Guzman PharmD, BCPS 04/22/2021 4:39 PM

## 2021-04-22 NOTE — Progress Notes (Signed)
ANTICOAGULATION CONSULT NOTE  Pharmacy Consult for Heparin Indication: chest pain/ACS  Allergies  Allergen Reactions   Calcium Channel Blockers     Would avoid if possible due to h/o peripheral edema    Patient Measurements: Height: _0  (165.1 cm) Weight: 119.3 kg (263 lb 0.1 oz) IBW/kg (Calculated) : 61.5 Heparin Dosing Weight: 90 kg   Vital Signs: Temp: 98.9 F (37.2 C) (10/20 2354) Temp Source: Oral (10/20 1548) BP: 151/68 (10/20 2354) Pulse Rate: 65 (10/20 2354)  Labs: Recent Labs    04/19/21 1317 04/19/21 1416 04/20/21 0541 04/21/21 0619 04/21/21 1558 04/21/21 2346  HGB 12.0*  --  11.1* 10.7*  --   --   HCT 37.5*  --  35.1* 32.4*  --   --   PLT 205  --  192 175  --   --   HEPARINUNFRC  --   --  0.59 0.94* 0.46 0.44  CREATININE  --  2.89* 2.68* 2.99*  --   --     Estimated Creatinine Clearance: 26.3 mL/min (A) (by C-G formula based on SCr of 2.99 mg/dL (H)).   Medical History: Past Medical History:  Diagnosis Date   Adrenal gland anomaly    Arthritis    CAD (coronary artery disease)    a. 04/2006 MI and PCI/stenting to mLCx & mRCA; b. 07/2009 Cath: patent LCX/RCA stents;  c. 12/2016 NSTEMI/Cath: LM 30, LAD 30p, 40m 30d, D1/2/3 min irregs, LCX 1035mSR, OM1 min irregs, RCA 20p, 10 ISR, 70105m0d, RPDA min irregs, RPAV 40-->Med Rx; d. 12/2017 MV: fixed lateral wall scar, mild anterior/anterior septal ischemia. EF 30-44%.   CHF (congestive heart failure) (HCC)    CKD (chronic kidney disease), stage III (HCC)    Colon polyps    DM2 (diabetes mellitus, type 2) (HCC)    insulin requiring   Dyslipidemia    GERD (gastroesophageal reflux disease)    History of echocardiogram    a. TTE 12/2016: EF 50-55%, mod concentric LVH, images inadequate for wall motion assessment, not technically sufficienct to allow for LV dias fxn, calcified mitral annulus    HTN (hypertension)    Kidney stones    [place]  a. 07/2013 s/p PTA/stenting ([facility]); b.  07/2017 Renal Duplex: No significant RAS.   Myocardial infarction (HCC)    OSA (obstructive sleep apnea)    [place]  a. 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   Recurrent UTI    Rotator cuff disorder    right   Skin cancer    head   Thyroid disorder    Venous insufficiency of both lower extremities    a. s/p laser treatment.    Medications:  Medications Prior to Admission  Medication Sig Dispense Refill Last Dose   acetaminophen (TYLENOL) 500 MG tablet Take 1,000 mg by mouth every 6 (six) hours as needed.   prn at prn   albuterol (VENTOLIN HFA) 108 (90 Base) MCG/ACT inhaler INHALE 2 PUFFS INTO THE LUNGS EVERY 6 HOURS AS NEEDED FOR WHEEZING OR SHORTNESS OF BREATH. DISPENSE 3 INHALERS 27 g 0 prn at prn   aspirin EC 81 MG tablet Take 1 tablet (81 mg total) by mouth daily. Swallow whole.   04/17/2021 at 0900   atorvastatin (LIPITOR) 80 MG tablet TAKE 1 TABLET AT BEDTIME 90 tablet 1 04/16/2021 at 2100   calcitRIOL (ROCALTROL) 0.25 MCG capsule Take 0.25 mcg by mouth daily.  04/17/2021 at 0900   carvedilol (COREG) 12.5 MG tablet TAKE 1 TABLET TWO TIMES DAILY WITH MEALS 180 tablet 1 04/17/2021 at 0900   cloNIDine (CATAPRES) 0.3 MG tablet TAKE 1 TABLET THREE TIMES DAILY 270 tablet 0 04/17/2021 at 1300   clopidogrel (PLAVIX) 75 MG tablet TAKE 1 TABLET EVERY DAY 90 tablet 2 04/17/2021 at 0900   dorzolamide-timolol (COSOPT) 22.3-6.8 MG/ML ophthalmic solution Place 1 drop into both eyes in the morning and at bedtime.   04/17/2021 at 0900   ezetimibe (ZETIA) 10 MG tablet TAKE 1 TABLET EVERY DAY 90 tablet 2 04/17/2021 at 0900   fluticasone (FLONASE) 50 MCG/ACT nasal spray Place 2 sprays into both nostrils daily. 16 g 6 prn at prn   Fluticasone-Umeclidin-Vilant (TRELEGY ELLIPTA) 200-62.5-25 MCG/INH AEPB Inhale 1 puff into the lungs daily. 60 each 0 04/17/2021 at 0900   furosemide (LASIX) 80 MG tablet Take 160 mg by mouth daily.    04/17/2021 at 1300   gabapentin (NEURONTIN) 400 MG capsule TAKE 2 CAPSULES IN THE MORNING  AND TAKE 3 CAPSULES EVERY NIGHT 450 capsule 3 04/17/2021 at 0900   hydrALAZINE (APRESOLINE) 50 MG tablet Take 1 tablet (50 mg total) by mouth 3 (three) times daily. 270 tablet 3 04/17/2021 at 1300   insulin NPH-regular Human (70-30) 100 UNIT/ML injection Inject 40-60 Units into the skin as directed. Take 60 units in the morning and 40 units in the evening   04/17/2021 at 0900   isosorbide mononitrate (IMDUR) 60 MG 24 hr tablet TAKE 1 AND 1/2 TABLETS TWICE DAILY 270 tablet 0 04/17/2021 at 0900   levothyroxine (SYNTHROID) 25 MCG tablet take 2 of the 25 mcg tablets Monday through Saturday with 1 tablet on Sunday.  13 pills/week. 180 tablet 1 04/17/2021 at 0700   montelukast (SINGULAIR) 10 MG tablet Take 1 tablet by mouth once daily 30 tablet 0 04/17/2021 at 0900   nitroGLYCERIN (NITROSTAT) 0.4 MG SL tablet Place 1 tablet (0.4 mg total) under the tongue every 5 (five) minutes as needed. May repeat x3 25 tablet prn prn at prn   omeprazole (PRILOSEC) 20 MG capsule Take 20 mg by mouth daily before breakfast.   04/17/2021 at 0700   potassium chloride (KLOR-CON) 10 MEQ tablet Take 2 tablets (20 meq) by mouth once daily 180 tablet 3 04/17/2021 at 1300   primidone (MYSOLINE) 50 MG tablet Take 0.5 tablets (25 mg total) by mouth in the morning. For tremor 45 tablet 1 04/17/2021 at 0900   sildenafil (REVATIO) 20 MG tablet Take 1 tablet (20 mg total) by mouth 3 (three) times daily. 270 tablet 3 04/17/2021 at 1300   tamsulosin (FLOMAX) 0.4 MG CAPS capsule TAKE 1 CAPSULE AT BEDTIME 90 capsule 1 04/16/2021 at 2100   torsemide (DEMADEX) 20 MG tablet Take 2 tablets (40 mg) by mouth once daily 180 tablet 2 04/17/2021 at 0900   ACCU-CHEK AVIVA PLUS test strip USE AS INSTRUCTED TO CHECK BLOOD SUGAR 3 TIMES DAILY OR AS NEEDED 300 strip 1    ACCU-CHEK SOFTCLIX LANCETS lancets Use as instructed to check blood sugar three times daily and  as needed.  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 100 each 5    Alcohol Swabs (B-D SINGLE USE SWABS REGULAR) PADS USE TO CLEANS AREA PRIOR TO CHECKING BLOOD SUGAR THREE TIMES DAILY AND AS NEEDED 400 each 3    Blood Glucose Monitoring Suppl (ACCU-CHEK AVIVA PLUS) w/Device KIT Use to check blood sugar three times daily and as needed.  Diagnosis: E11.22  Insulin-dependent  1 kit 0    diphenhydrAMINE (BENADRYL) 50 MG tablet Take 50 mg by mouth at bedtime as needed for allergies. (Patient not taking: Reported on 04/18/2021)   Not Taking   DROPLET INSULIN SYRINGE 30G X 5/16" 1 ML MISC USE AS DIRECTED TO INJECT INSULIN 200 each 2    HYDROcodone-acetaminophen (NORCO/VICODIN) 5-325 MG tablet Take 1 tablet by mouth every 6 (six) hours as needed for moderate pain. (Patient not taking: Reported on 04/18/2021) 20 tablet 0 Not Taking   Sennosides (EX-LAX) 15 MG CHEW Chew 15 mg by mouth 2 (two) times daily as needed (constipation). (Patient not taking: Reported on 04/18/2021)   Not Taking    Assessment: 76 year old male w/ PMH of moderate persistent asthma, interstitial lung disease, pulmonary hypertension, OSA with chronic respiratory failure on home O2 at 4 L, diastolic heart failure, CAD, DM, CKD IV who was admitted with ACS/NSTEMI.   Pharmacy was consulted for heparin dosing. No prior anticoagulation noted.   Date/time HL Comment, interpretation 10/17 1441 0.15 Subthera, at 1250 units/hr 10/18 0259 0.55 Therapeutic x 1 @ 1550 u/hr 10/18 1130 0.56 Therapeutic x2 10/19  0629     0.59     Therapeutic 10/20  0700     0.94     Supratherapeutic, decreased 10/20 1558 0.46 Therapeutic.  10/20 2346 0.44 Therapeutic  Goal of Therapy:  Heparin level 0.3-0.7 units/ml Monitor platelets by anticoagulation protocol: Yes   Plan:  Heparin level is therapeutic. Will continue heparin infusion at 1350 units/hr. Recheck heparin level daily with AM labs while therapeutic on heparin. CBC daily while on heparin.   Renda Rolls, PharmD, MBA 04/22/2021 1:30 AM

## 2021-04-22 NOTE — Progress Notes (Signed)
Progress Note  Patient Name: Stephen [name] Date of Encounter: 04/22/2021  Primary Cardiologist: Ida Rogue, MD  Subjective   Feels that breathing is at baseline.  Struggles to sit himself up in bed - becomes dyspneic easily.  Creat higher this AM.  Inpatient Medications    Scheduled Meds:  aspirin EC  81 mg Oral Daily   atorvastatin  80 mg Oral QHS   calcitRIOL  0.25 mcg Oral Daily   carvedilol  12.5 mg Oral BID WC   cloNIDine  0.3 mg Oral TID   clopidogrel  75 mg Oral Daily   dorzolamide-timolol  1 drop Both Eyes BID   ezetimibe  10 mg Oral Daily   fluticasone furoate-vilanterol  1 puff Inhalation Daily   gabapentin  400 mg Oral Daily   gabapentin  600 mg Oral QHS   hydrALAZINE  100 mg Oral TID   insulin aspart  0-15 Units Subcutaneous TID WC   insulin aspart  0-5 Units Subcutaneous QHS   insulin aspart  8 Units Subcutaneous TID WC   insulin glargine-yfgn  35 Units Subcutaneous QHS   ipratropium-albuterol  3 mL Nebulization Q6H   isosorbide mononitrate  120 mg Oral Daily   [START ON 04/24/2021] levothyroxine  25 mcg Oral Weekly   levothyroxine  50 mcg Oral Once per day on Mon Tue Wed Thu Fri Sat   montelukast  10 mg Oral Daily   multivitamin with minerals  1 tablet Oral Daily   pantoprazole  40 mg Oral Daily   potassium chloride  20 mEq Oral Daily   predniSONE  40 mg Oral Q breakfast   Ensure Max Protein  11 oz Oral BID   sodium chloride flush  3 mL Intravenous Q12H   tamsulosin  0.4 mg Oral QHS   Continuous Infusions:  sodium chloride     PRN Meds: sodium chloride, acetaminophen, albuterol, fluticasone, guaiFENesin-dextromethorphan, hydrALAZINE, HYDROcodone-acetaminophen, nitroGLYCERIN, ondansetron (ZOFRAN) IV, sodium chloride flush   Vital Signs    Vitals:   04/21/21 1957 04/21/21 2354 04/22/21 0441 04/22/21 0500  BP: (!) 140/54 (!) 151/68 (!) 144/58   Pulse: 70 65 65   Resp: 18 18 18    Temp: 98 F (36.7 C) 98.9 F (37.2 C) 98 F (36.7 C)    TempSrc:      SpO2: 100% 99% 99%   Weight:    120.3 kg  Height:        Intake/Output Summary (Last 24 hours) at 04/22/2021 0802 Last data filed at 04/22/2021 0701 Gross per 24 hour  Intake 1821.88 ml  Output 2950 ml  Net -1128.12 ml   Filed Weights   04/20/21 0143 04/21/21 0212 04/22/21 0500  Weight: 119.8 kg 119.3 kg 120.3 kg    Physical Exam   GEN: Obese, in no acute distress.  HEENT: Grossly normal.  Neck: Supple, obese - unable to gauge JVP.  No carotid bruits, or masses. Cardiac: RRR, no murmurs, rubs, or gallops. No clubbing, cyanosis, trace bilat ankle edema.  Radials 2+, DP/PT 1+ and equal bilaterally.  Respiratory:  Respirations regular and unlabored, bibasilar crackles, otw diminished breath sounds. GI: Obese, soft, nontender, nondistended, BS + x 4. MS: no deformity or atrophy. Skin: warm and dry, no rash. Neuro:  Strength and sensation are intact. Psych: AAOx3.  Normal affect.  Labs    Chemistry Recent Labs  Lab 04/17/21 2101 04/18/21 0505 04/20/21 0541 04/21/21 0619 04/22/21 0513  NA 141   < > 137 134* 135  K  4.0   < > 3.7 3.8 4.0  CL 105   < > 102 100 101  CO2 27   < > 28 29 29   GLUCOSE 216*   < > 287* 302* 365*  BUN 36*   < > 67* 70* 72*  CREATININE 2.66*   < > 2.68* 2.99* 3.10*  CALCIUM 8.3*   < > 8.4* 8.3* 8.5*  PROT 6.8  --   --   --   --   ALBUMIN 2.9*  --   --   --   --   AST 22  --   --   --   --   ALT 17  --   --   --   --   ALKPHOS 84  --   --   --   --   BILITOT 0.7  --   --   --   --   GFRNONAA 25*   < > 24* 21* 20*  ANIONGAP 9   < > 7 5 5    < > = values in this interval not displayed.     Hematology Recent Labs  Lab 04/20/21 0541 04/21/21 0619 04/22/21 0513  WBC 11.2* 12.2* 13.3*  RBC 4.00* 3.67* 3.51*  HGB 11.1* 10.7* 10.3*  HCT 35.1* 32.4* 31.1*  MCV 87.8 88.3 88.6  MCH 27.8 29.2 29.3  MCHC 31.6 33.0 33.1  RDW 15.3 15.0 15.2  PLT 192 175 150    Cardiac Enzymes  Recent Labs  Lab 04/05/21 2016 04/17/21 2101  04/18/21 0505 04/18/21 0658 04/18/21 1240  TROPONINIHS 107* 74* 2,129* 1,997* 1,842*      BNP Recent Labs  Lab 04/17/21 2058 04/19/21 0359  BNP 250.6* 680.2*    Lipids  Lab Results  Component Value Date   CHOL 125 12/23/2018   HDL 27.70 (L) 12/23/2018   LDLCALC 69 12/23/2018   TRIG 142.0 12/23/2018   CHOLHDL 5 12/23/2018    HbA1c  Lab Results  Component Value Date   HGBA1C 6.7 (H) 02/21/2021    Radiology    DG Chest Port 1 View  Result Date: 04/19/2021 CLINICAL DATA:  Shortness of breath, chest pain, history of CHF EXAM: PORTABLE CHEST 1 VIEW COMPARISON:  Chest radiograph 04/17/2021 FINDINGS: The heart is enlarged, unchanged. The mediastinal contours are stable. Increased vascular markings are again seen throughout both lungs, slightly improved in the interim. There is no new or worsening focal airspace disease. There is no pleural effusion. There is no pneumothorax. There is no acute osseous abnormality. IMPRESSION: Increased interstitial markings in both lungs are slightly improved compared to the prior study, suggesting improving pulmonary edema and/or infection Electronically Signed   By: Valetta Mole M.D.   On: 04/19/2021 09:10   Telemetry    RSR - Personally Reviewed  Cardiac Studies   Cardiac Catheterization  2.2022   Diagnostic Dominance: Right _____________   Cardiac Catheterization  6.6.2022   RA: 13 mmHg PCW: 20 mmHg with a V wave of 27 mmHg PA: 44/ 18 with a mean of 30 mmHg.  Pulmonary vascular resistance is only 1.14 Woods unit RV: 48 /12 mmHg PA sat is 97% with cardiac output of 8.79 and cardiac index of 3.98.   Recommendations: Recommend increasing torsemide to 40 mg once daily.  Check basic metabolic profile in 1 week.. _____________    2D Echocardiogram 10.17.2022   1. Left ventricular ejection fraction, by estimation, is 60 to 65%. The  left ventricle has normal function. Left ventricular  endocardial border  not optimally defined to  evaluate regional wall motion. The left  ventricular internal cavity size was mildly  dilated. There is mild left ventricular hypertrophy. Left ventricular  diastolic parameters are consistent with Grade I diastolic dysfunction  (impaired relaxation). Elevated left atrial pressure.   2. Right ventricular systolic function is normal. The right ventricular  size is not well visualized.   3. The mitral valve is abnormal. Trivial mitral valve regurgitation. Mild  mitral stenosis. The mean mitral valve gradient is 5.0 mmHg.   4. Tricuspid valve regurgitation not well assessed.   5. The aortic valve has an indeterminant number of cusps. There is mild  calcification of the aortic valve. There is moderate thickening of the  aortic valve. Aortic valve regurgitation is trivial. Mild to moderate  aortic valve sclerosis/calcification is  present, without any evidence of aortic stenosis.  _____________   Patient Profile     74 y.o. male with a hx of  pulmonary hypertension, chronic diastolic CHF, CAD, pulmonary fibrosis s/p wedge resection, obesity, COPD on home oxygen, OSA on CPAP, DM2, CKD, renal artery stenosis, and who is being seen today for the evaluation of chest pain and elevated HS Tn.   Assessment & Plan    1.  Acute on chronic HFpEF/PAH:  Admitted w/ chest tightness and dyspnea Volume overloaded.  Echo w/ nl LV fxn, GrI DD.  No significant valvular dzs.  Responded well to IV lasix, though creat now bumping.  Lasix held this AM.  Minus 898 ml yesterday and 5.4 L since admission.  Wt recorded as up 1 kg despite net neg yesterday.  Body habitus makes exam challenging, though only trace lower ext edema noted.  He feels that breathing is @ baseline (in setting of ILD/pulm fibrosis).  Hold lasix and f/u creat in AM.  Baseline ~ 2.5-2.75.  As prev discussed, he was on lasix 160 daily @ home.  Will likely need 160 in AM and 40 (or more) in PM.  HR stable.  BP trending high.  Already on high dose  hydralazine, imdur, and clonidine.  Will titrate carvedilol to 18.75mg  BID.  2.  CAD/NSTEMI:  Admitted w/ chest pain and dyspnea. Peak HsTrop of 2129.   Cath in Feb w/ mod LAD and RCA dzs and occluded LCX w/ R  L and L  L collaterals  He has been medically managed since.  Chest pain free @ this time.  Symptoms improved w/ diuresis.  Cont asa, plavix, ? blocker, statin, and nitrate.  No plan for cath in setting of CKD IV.  3.  Essential HTN:  trending higher over past 24 hrs.  Will inc carvedilol to 18.75 bid.  4.  HL:  LDL 69 12/2020.  Cont statin.  5.  CKD IV:  W/ AKI in setting of diuresis - lasix d/c'd this AM.  Will need close outpt f/u w/ his nephrologist.  6.  ILD/Pulm fibrosis:  Steroids/nebs per IM.  Req 6lpm via [place].  7.  OSA:  on CPAP.  Signed, Murray Hodgkins, NP  04/22/2021, 8:02 AM    For questions or updates, please contact   Please consult [email or web address] for contact info under Cardiology/STEMI.

## 2021-04-23 ENCOUNTER — Encounter: Payer: Self-pay | Admitting: Internal Medicine

## 2021-04-23 DIAGNOSIS — G9341 Metabolic encephalopathy: Secondary | ICD-10-CM | POA: Diagnosis not present

## 2021-04-23 DIAGNOSIS — N390 Urinary tract infection, site not specified: Secondary | ICD-10-CM

## 2021-04-23 DIAGNOSIS — J9602 Acute respiratory failure with hypercapnia: Secondary | ICD-10-CM

## 2021-04-23 DIAGNOSIS — J441 Chronic obstructive pulmonary disease with (acute) exacerbation: Secondary | ICD-10-CM | POA: Diagnosis not present

## 2021-04-23 DIAGNOSIS — I214 Non-ST elevation (NSTEMI) myocardial infarction: Secondary | ICD-10-CM | POA: Diagnosis not present

## 2021-04-23 DIAGNOSIS — J9601 Acute respiratory failure with hypoxia: Secondary | ICD-10-CM

## 2021-04-23 DIAGNOSIS — J9621 Acute and chronic respiratory failure with hypoxia: Secondary | ICD-10-CM | POA: Diagnosis not present

## 2021-04-23 LAB — VITAMIN B12: Vitamin B-12: 148 pg/mL — ABNORMAL LOW (ref 180–914)

## 2021-04-23 LAB — BASIC METABOLIC PANEL
Anion gap: 9 (ref 5–15)
BUN: 81 mg/dL — ABNORMAL HIGH (ref 8–23)
CO2: 29 mmol/L (ref 22–32)
Calcium: 8.6 mg/dL — ABNORMAL LOW (ref 8.9–10.3)
Chloride: 100 mmol/L (ref 98–111)
Creatinine, Ser: 3.37 mg/dL — ABNORMAL HIGH (ref 0.61–1.24)
GFR, Estimated: 18 mL/min — ABNORMAL LOW (ref >60)
Glucose, Bld: 262 mg/dL — ABNORMAL HIGH (ref 70–99)
Potassium: 4.3 mmol/L (ref 3.5–5.1)
Sodium: 138 mmol/L (ref 135–145)

## 2021-04-23 LAB — T4, FREE: Free T4: 0.82 ng/dL (ref 0.61–1.12)

## 2021-04-23 LAB — CBC
HCT: 31.1 % — ABNORMAL LOW (ref 39.0–52.0)
Hemoglobin: 9.8 g/dL — ABNORMAL LOW (ref 13.0–17.0)
MCH: 28.2 pg (ref 26.0–34.0)
MCHC: 31.5 g/dL (ref 30.0–36.0)
MCV: 89.6 fL (ref 80.0–100.0)
Platelets: 160 10*3/uL (ref 150–400)
RBC: 3.47 MIL/uL — ABNORMAL LOW (ref 4.22–5.81)
RDW: 15.6 % — ABNORMAL HIGH (ref 11.5–15.5)
WBC: 13.5 10*3/uL — ABNORMAL HIGH (ref 4.0–10.5)
nRBC: 0 % (ref 0.0–0.2)

## 2021-04-23 LAB — IRON AND TIBC
Iron: 14 ug/dL — ABNORMAL LOW (ref 45–182)
Saturation Ratios: 7 % — ABNORMAL LOW (ref 17.9–39.5)
TIBC: 196 ug/dL — ABNORMAL LOW (ref 250–450)
UIBC: 182 ug/dL

## 2021-04-23 LAB — CORTISOL-AM, BLOOD: Cortisol - AM: 6 ug/dL — ABNORMAL LOW (ref 6.7–22.6)

## 2021-04-23 LAB — TSH: TSH: 1.361 u[IU]/mL (ref 0.350–4.500)

## 2021-04-23 LAB — GLUCOSE, CAPILLARY
Glucose-Capillary: 226 mg/dL — ABNORMAL HIGH (ref 70–99)
Glucose-Capillary: 295 mg/dL — ABNORMAL HIGH (ref 70–99)
Glucose-Capillary: 415 mg/dL — ABNORMAL HIGH (ref 70–99)
Glucose-Capillary: 399 mg/dL — ABNORMAL HIGH (ref 70–99)

## 2021-04-23 MED ORDER — GABAPENTIN 100 MG PO CAPS
100.0000 mg | ORAL_CAPSULE | Freq: Two times a day (BID) | ORAL | Status: DC
  Administered 2021-04-23 – 2021-04-30 (×15): 100 mg via ORAL
  Filled 2021-04-23 (×15): qty 1

## 2021-04-23 MED ORDER — FLUCONAZOLE 100 MG PO TABS
100.0000 mg | ORAL_TABLET | Freq: Every day | ORAL | Status: AC
  Administered 2021-04-23 – 2021-04-29 (×7): 100 mg via ORAL
  Filled 2021-04-23 (×7): qty 1

## 2021-04-23 MED ORDER — SODIUM CHLORIDE 0.9 % IV SOLN
1.0000 g | Freq: Two times a day (BID) | INTRAVENOUS | Status: AC
  Administered 2021-04-23 – 2021-04-29 (×14): 1 g via INTRAVENOUS
  Filled 2021-04-23: qty 10
  Filled 2021-04-23: qty 1
  Filled 2021-04-23 (×3): qty 10
  Filled 2021-04-23 (×3): qty 1
  Filled 2021-04-23 (×3): qty 10
  Filled 2021-04-23: qty 1
  Filled 2021-04-23 (×3): qty 10

## 2021-04-23 MED ORDER — SODIUM CHLORIDE 0.9 % IV SOLN: INTRAVENOUS | Status: AC

## 2021-04-23 NOTE — Progress Notes (Signed)
CBG is 415. Notified Dr. Jimmye Norman who agrees with proceeding with SSI administration, no further orders.

## 2021-04-23 NOTE — Progress Notes (Signed)
Progress Note  Patient Name: Stephen [name] Date of Encounter: 04/23/2021  Primary Cardiologist: Ida Rogue, MD  Subjective   Patient on BiPAP this morning.  Events from yesterday reviewed.  Still somewhat somnolent but opens his eyes and tries to answer questions through BiPAP.  No complaints.  Wife at bedside.  Inpatient Medications    Scheduled Meds:  aspirin EC  81 mg Oral Daily   atorvastatin  80 mg Oral QHS   calcitRIOL  0.25 mcg Oral Daily   carvedilol  18.75 mg Oral BID WC   cloNIDine  0.3 mg Oral TID   clopidogrel  75 mg Oral Daily   dorzolamide-timolol  1 drop Both Eyes BID   enoxaparin (LOVENOX) injection  30 mg Subcutaneous Q24H   ezetimibe  10 mg Oral Daily   fluconazole  100 mg Oral Daily   fluticasone furoate-vilanterol  1 puff Inhalation Daily   gabapentin  100 mg Oral BID   hydrALAZINE  100 mg Oral TID   insulin aspart  0-15 Units Subcutaneous TID WC   insulin aspart  0-5 Units Subcutaneous QHS   insulin aspart  8 Units Subcutaneous TID WC   insulin glargine-yfgn  35 Units Subcutaneous QHS   ipratropium-albuterol  3 mL Nebulization Q6H   isosorbide mononitrate  120 mg Oral Daily   [START ON 04/24/2021] levothyroxine  25 mcg Oral Weekly   levothyroxine  50 mcg Oral Once per day on Mon Tue Wed Thu Fri Sat   montelukast  10 mg Oral Daily   multivitamin with minerals  1 tablet Oral Daily   pantoprazole  40 mg Oral Daily   Ensure Max Protein  11 oz Oral BID   sodium chloride flush  3 mL Intravenous Q12H   tamsulosin  0.4 mg Oral QHS   Continuous Infusions:  sodium chloride     sodium chloride 75 mL/hr at 04/23/21 0428   cefTRIAXone (ROCEPHIN)  IV 1 g (04/23/21 1007)   PRN Meds: sodium chloride, acetaminophen, albuterol, fluticasone, guaiFENesin-dextromethorphan, hydrALAZINE, HYDROcodone-acetaminophen, nitroGLYCERIN, ondansetron (ZOFRAN) IV, sodium chloride flush   Vital Signs    Vitals:   04/23/21 0503 04/23/21 0506 04/23/21 0741 04/23/21  0920  BP:  (!) 139/59 (!) 144/58   Pulse:  (!) 52 (!) 59 64  Resp:  18 18   Temp:  97.8 F (36.6 C) 98.8 F (37.1 C)   TempSrc:      SpO2:  98% 98%   Weight: 121.4 kg     Height:        Intake/Output Summary (Last 24 hours) at 04/23/2021 1048 Last data filed at 04/23/2021 1000 Gross per 24 hour  Intake 596.92 ml  Output 1675 ml  Net -1078.08 ml   Filed Weights   04/21/21 0212 04/22/21 0500 04/23/21 0503  Weight: 119.3 kg 120.3 kg 121.4 kg    Physical Exam   GEN: Obese, in no acute distress.  Currently on BiPAP. HEENT: Grossly normal.  Neck: Obese, difficult to gauge JVP.  No, carotid bruits, or masses. Cardiac: RRR, distant, no murmurs, rubs, or gallops. No clubbing, cyanosis, edema.  Radials 2+, DP/PT 1+ and equal bilaterally.  Respiratory:  Respirations regular and unlabored, coarse and diminished breath sounds throughout in the setting of BiPAP. GI: Obese, soft, nontender, nondistended, BS + x 4. MS: no deformity or atrophy. Skin: warm and dry, no rash. Neuro:  Strength and sensation are intact. Psych: Sleepy but arousable.  Tries to answer questions but having difficulty secondary to BiPAP.  Labs    Chemistry Recent Labs  Lab 04/17/21 2101 04/18/21 0505 04/22/21 0513 04/22/21 2058 04/23/21 0346  NA 141   < > 135 136 138  K 4.0   < > 4.0 4.6 4.3  CL 105   < > 101 99 100  CO2 27   < > 29 28 29   GLUCOSE 216*   < > 365* 293* 262*  BUN 36*   < > 72* 78* 81*  CREATININE 2.66*   < > 3.10* 3.27* 3.37*  CALCIUM 8.3*   < > 8.5* 8.5* 8.6*  PROT 6.8  --   --  6.1*  --   ALBUMIN 2.9*  --   --  2.5*  --   AST 22  --   --  20  --   ALT 17  --   --  26  --   ALKPHOS 84  --   --  51  --   BILITOT 0.7  --   --  0.6  --   GFRNONAA 25*   < > 20* 19* 18*  ANIONGAP 9   < > 5 9 9    < > = values in this interval not displayed.     Hematology Recent Labs  Lab 04/22/21 0513 04/22/21 2058 04/23/21 0346  WBC 13.3* 14.8* 13.5*  RBC 3.51* 3.53* 3.47*  HGB 10.3* 9.9*  9.8*  HCT 31.1* 30.9* 31.1*  MCV 88.6 87.5 89.6  MCH 29.3 28.0 28.2  MCHC 33.1 32.0 31.5  RDW 15.2 15.4 15.6*  PLT 150 151 160    Cardiac Enzymes  Recent Labs  Lab 04/05/21 2016 04/17/21 2101 04/18/21 0505 04/18/21 0658 04/18/21 1240  TROPONINIHS 107* 74* 2,129* 1,997* 1,842*      BNP Recent Labs  Lab 04/17/21 2058 04/19/21 0359  BNP 250.6* 680.2*   Lipids  Lab Results  Component Value Date   CHOL 125 12/23/2018   HDL 27.70 (L) 12/23/2018   LDLCALC 69 12/23/2018   TRIG 142.0 12/23/2018   CHOLHDL 5 12/23/2018    HbA1c  Lab Results  Component Value Date   HGBA1C 6.7 (H) 02/21/2021    Radiology    CT HEAD WO CONTRAST (5MM)  Result Date: 04/22/2021 CLINICAL DATA:  Delirium EXAM: CT HEAD WITHOUT CONTRAST TECHNIQUE: Contiguous axial images were obtained from the base of the skull through the vertex without intravenous contrast. COMPARISON:  None. FINDINGS: Brain: The brain shows a normal appearance without evidence of malformation, atrophy, old or acute small or large vessel infarction, mass lesion, hemorrhage, hydrocephalus or extra-axial collection. Vascular: There is atherosclerotic calcification of the major vessels at the base of the brain. Skull: Normal.  No traumatic finding.  No focal bone lesion. Sinuses/Orbits: Sinuses are clear. Orbits appear normal. Mastoids are clear. Other: None significant IMPRESSION: No acute or focal finding. Atherosclerotic calcification of the major vessels at the base of the brain. Electronically Signed   By: Nelson Chimes M.D.   On: 04/22/2021 15:54   DG Chest Port 1 View  Result Date: 04/22/2021 CLINICAL DATA:  Acute respiratory failure EXAM: PORTABLE CHEST 1 VIEW COMPARISON:  04/19/2021, CT 04/05/2021, 08/14/2018 FINDINGS: Low lung volumes. Cardiomegaly. Underlying pulmonary fibrosis and chronic interstitial lung disease. Improved superimposed ground-glass opacities since 04/05/2021. Aortic atherosclerosis. No pneumothorax.  IMPRESSION: 1. Underlying chronic lung disease though with improved aeration since 04/05/2021 consistent with decreased superimposed edema or infection 2. Cardiomegaly Electronically Signed   By: Donavan Foil M.D.   On: 04/22/2021 21:07  Telemetry    Regular sinus rhythm with occasional PVCs- Personally Reviewed  Cardiac Studies   Cardiac Catheterization  2.2022   Diagnostic Dominance: Right _____________   Cardiac Catheterization  6.6.2022   RA: 13 mmHg PCW: 20 mmHg with a V wave of 27 mmHg PA: 44/ 18 with a mean of 30 mmHg.  Pulmonary vascular resistance is only 1.14 Woods unit RV: 48 /12 mmHg PA sat is 97% with cardiac output of 8.79 and cardiac index of 3.98.   Recommendations: Recommend increasing torsemide to 40 mg once daily.  Check basic metabolic profile in 1 week.. _____________    2D Echocardiogram 10.17.2022   1. Left ventricular ejection fraction, by estimation, is 60 to 65%. The  left ventricle has normal function. Left ventricular endocardial border  not optimally defined to evaluate regional wall motion. The left  ventricular internal cavity size was mildly  dilated. There is mild left ventricular hypertrophy. Left ventricular  diastolic parameters are consistent with Grade I diastolic dysfunction  (impaired relaxation). Elevated left atrial pressure.   2. Right ventricular systolic function is normal. The right ventricular  size is not well visualized.   3. The mitral valve is abnormal. Trivial mitral valve regurgitation. Mild  mitral stenosis. The mean mitral valve gradient is 5.0 mmHg.   4. Tricuspid valve regurgitation not well assessed.   5. The aortic valve has an indeterminant number of cusps. There is mild  calcification of the aortic valve. There is moderate thickening of the  aortic valve. Aortic valve regurgitation is trivial. Mild to moderate  aortic valve sclerosis/calcification is  present, without any evidence of aortic stenosis.   _____________   Patient Profile     75 y.o.75 y.o. male with a hx of  pulmonary hypertension, chronic diastolic CHF, CAD, pulmonary fibrosis s/p wedge resection, obesity, COPD on home oxygen, OSA on CPAP, DM2, CKD, renal artery stenosis, and who is being seen today for the evaluation of chest pain and elevated HS Tn.   Assessment & Plan    1.  Acute on chronic HFpEF/pulmonary arterial hypertension: Admitted with chest tightness and dyspnea along with volume overload.  Echo with normal LV function and grade 1 diastolic dysfunction.  No significant valvular disease.  Responded well to IV Lasix, which is currently on hold in setting of rising creatinine-currently 3.37 with a BUN of 81.  He appears euvolemic on examination, the body habitus continues to make exam challenging.  Currently being treated for UTI/encephalopathy.  Continue to follow volume status closely.  Once recovered, will likely need Lasix 160 mg in the a.m. and 40 in the p.m., as his previous home dose was 160 mg daily.  2.  CAD/non-STEMI: Admitted with chest pain and dyspnea.  Peak high-sensitivity troponin of 2129.  Cath in February with moderate LAD and RCA disease and occluded left circumflex with right to left and left to left collaterals.  He has been medically managed since.  Chest pain-free at this time.  Symptoms improved with diuresis.  Continue aspirin, Plavix, beta-blocker, statin, and nitrate.  No plan for cath in the setting of CKD 4.  3.  Essential hypertension: Pressures are currently trending in the 130s-140s.  Beta-blocker titrated yesterday.  Continue current doses of clonidine, hydralazine, and isosorbide.  Follow trend today.  Allow for higher pressures for now given concern related to UTI/encephalopathy and potential development of sepsis.  4.  Hyperlipidemia: LDL of 69 in June 2022.  Continue statin.  5.  Stage IV chronic kidney  disease: With acute kidney injury in the setting of diuresis.  Lasix discontinued on  the morning of October 21 prior to receiving however, BUN and creatinine continue to rise and are 81/3.37 this AM.  Continue to hold diuretic therapy.  Consider nephrology consultation for worsening renal function.    6.  Interstitial lung disease/pulmonary fibrosis: Steroids/nebulizers per internal medicine.  Currently on BiPAP.  7.  Obstructive sleep apnea: Uses CPAP at night.  Signed, Murray Hodgkins, NP  04/23/2021, 10:48 AM    For questions or updates, please contact   Please consult [email or web address] for contact info under Cardiology/STEMI.

## 2021-04-23 NOTE — Progress Notes (Addendum)
PROGRESS NOTE    Stephen [name]  HWE:[id number] DOB: 1946-03-15 DOA: 04/17/2021 PCP: [name], MD    Assessment & Plan:   Active Problems:   Type 2 diabetes mellitus with renal complication (HCC)   OSA (obstructive sleep apnea)   CAD (coronary artery disease)   Essential hypertension, benign   Acute on chronic heart failure with preserved ejection fraction (HFpEF) (HCC)   Pulmonary hypertension, primary (HCC)   Acute on chronic respiratory failure with hypoxemia (HCC)   CKD (chronic kidney disease) stage 4, GFR 15-29 ml/min (HCC)   ILD (interstitial lung disease) (HCC)   COPD with acute exacerbation ([place])   NSTEMI: w/ hx of CAD. EF 89-38%, grade I diastolic dysfunction.  Continue on coreg, imdur, plavix, statin & zetia. D/c IV heparin drip as per cardio. No cardiac cath now secondary to CKD & unable to lay flat due to orthopnea as per cardio    Acute on chronic hypoxic & hypercapnic respiratory failure: presenting in acute respiratory distress, speaking in short sentences, requiring BiPAP. Weaned off of BiPAP again. Etiology likely secondary to COPD,  acute on chronic diastolic CHF exacerbation superimposed on chronic pulmon conditions, ILD, PAH. Completed steroid course. Lasix on hold secondary to AKI on CKD. Continue on bronchodilators and encourage incentive spirometry   Encephalopathy: etiology unclear, possibly metabolic vs retention of CO2. CT head neg for any acute intracranial findings. Improved today   Possible UTI: UA is positive. Urine cx is pending. Continue on IV rocephin    Acute on chronic diastolic CHF: continue to hold lasix as Cr continues to trend up. Monitor I/Os and daily weights. Cardio recs apprec   HTN emergency: resolved but still w/ HTN. Continue on imdur, hydralazine, coreg   COPD exacerbation: completed steroid course. Continue on bronchodilators and incentive spirometry     Pulmon disease: w/ hx of ILD, pulmonary HTN. Continue to hold  sildanefil   CKDIV: baseline Cr 2.6. Cr is trending up daily and if continues to rise will consult nephro   DM2: likely poorly controlled. Continue on glargine, SSI w/ accuchecks    OSA: uses CPAP qhs at home    DVT prophylaxis: heparin  Code Status: full  Family Communication: discussed pt's care w/ pt's wife at bedside and answered her questions  Disposition Plan: likely d/c back home   Level of care: Progressive Cardiac  Status is: Inpatient  Remains inpatient appropriate because: secondary respiratory status, severity of illness     Consultants:  Cardio   Procedures:   Antimicrobials:    Subjective: Pt c/o malaise   Objective: Vitals:   04/23/21 0251 04/23/21 0503 04/23/21 0506 04/23/21 0741  BP:   (!) 139/59 (!) 144/58  Pulse: (!) 57  (!) 52 (!) 59  Resp:   18 18  Temp:   97.8 F (36.6 C) 98.8 F (37.1 C)  TempSrc:      SpO2: 97%  98% 98%  Weight:  121.4 kg    Height:        Intake/Output Summary (Last 24 hours) at 04/23/2021 0822 Last data filed at 04/23/2021 0600 Gross per 24 hour  Intake 593.92 ml  Output 2025 ml  Net -1431.08 ml   Filed Weights   04/21/21 0212 04/22/21 0500 04/23/21 0503  Weight: 119.3 kg 120.3 kg 121.4 kg    Examination:  General exam: Appears comfortable  Respiratory system: decreased breath sounds b/l  Cardiovascular system: S1 & S2+. No rubs or clicks  Gastrointestinal system: Abd is  soft, NT, obese & hypoactive bowel sounds Central nervous system: Alert and awake. Moves all extremities  Psychiatry: Judgement and insight appears poor. Flat mood and affect     Data Reviewed: I have personally reviewed following labs and imaging studies  CBC: Recent Labs  Lab 04/17/21 2101 04/19/21 0259 04/20/21 0541 04/21/21 0619 04/22/21 0513 04/22/21 2058 04/23/21 0346  WBC 6.0   < > 11.2* 12.2* 13.3* 14.8* 13.5*  NEUTROABS 4.8  --   --   --   --  13.7*  --   HGB 9.9*   < > 11.1* 10.7* 10.3* 9.9* 9.8*  HCT 31.4*   <  > 35.1* 32.4* 31.1* 30.9* 31.1*  MCV 92.4   < > 87.8 88.3 88.6 87.5 89.6  PLT 153   < > 192 175 150 151 160   < > = values in this interval not displayed.   Basic Metabolic Panel: Recent Labs  Lab 04/20/21 0541 04/21/21 0619 04/22/21 0513 04/22/21 2058 04/23/21 0346  NA 137 134* 135 136 138  K 3.7 3.8 4.0 4.6 4.3  CL 102 100 101 99 100  CO2 28 29 29 28 29   GLUCOSE 287* 302* 365* 293* 262*  BUN 67* 70* 72* 78* 81*  CREATININE 2.68* 2.99* 3.10* 3.27* 3.37*  CALCIUM 8.4* 8.3* 8.5* 8.5* 8.6*  MG  --   --   --  2.2  --    GFR: Estimated Creatinine Clearance: 23.6 mL/min (A) (by C-G formula based on SCr of 3.37 mg/dL (H)). Liver Function Tests: Recent Labs  Lab 04/17/21 2101 04/22/21 2058  AST 22 20  ALT 17 26  ALKPHOS 84 51  BILITOT 0.7 0.6  PROT 6.8 6.1*  ALBUMIN 2.9* 2.5*   No results for input(s): LIPASE, AMYLASE in the last 168 hours. Recent Labs  Lab 04/22/21 2058  AMMONIA 22   Coagulation Profile: Recent Labs  Lab 04/18/21 0623  INR 1.1   Cardiac Enzymes: No results for input(s): CKTOTAL, CKMB, CKMBINDEX, TROPONINI in the last 168 hours. BNP (last 3 results) No results for input(s): PROBNP in the last 8760 hours. HbA1C: No results for input(s): HGBA1C in the last 72 hours. CBG: Recent Labs  Lab 04/22/21 0822 04/22/21 1210 04/22/21 1645 04/22/21 2057 04/23/21 0811  GLUCAP 298* 289* 320* 269* 226*   Lipid Profile: No results for input(s): CHOL, HDL, LDLCALC, TRIG, CHOLHDL, LDLDIRECT in the last 72 hours. Thyroid Function Tests: Recent Labs    04/23/21 0346  TSH 1.361  FREET4 0.82   Anemia Panel: Recent Labs    04/23/21 0346  TIBC 196*  IRON 14*   Sepsis Labs: Recent Labs  Lab 04/17/21 2101 04/18/21 0658 04/22/21 1634  PROCALCITON  --   --  0.22  LATICACIDVEN 1.2 0.9  --     Recent Results (from the past 240 hour(s))  Resp Panel by RT-PCR (Flu A&B, Covid) Nasopharyngeal Swab     Status: None   Collection Time: 04/17/21  8:58  PM   Specimen: Nasopharyngeal Swab; Nasopharyngeal(NP) swabs in vial transport medium  Result Value Ref Range Status   SARS Coronavirus 2 by RT PCR NEGATIVE NEGATIVE Final    Comment: (NOTE) SARS-CoV-2 target nucleic acids are NOT DETECTED.  The SARS-CoV-2 RNA is generally detectable in upper respiratory specimens during the acute phase of infection. The lowest concentration of SARS-CoV-2 viral copies this assay can detect is 138 copies/mL. A negative result does not preclude SARS-Cov-2 infection and should not be used as the sole  basis for treatment or other patient management decisions. A negative result may occur with  improper specimen collection/handling, submission of specimen other than nasopharyngeal swab, presence of viral mutation(s) within the areas targeted by this assay, and inadequate number of viral copies(<138 copies/mL). A negative result must be combined with clinical observations, patient history, and epidemiological information. The expected result is Negative.  Fact Sheet for Patients:  EntrepreneurPulse.com.au  Fact Sheet for Healthcare Providers:  IncredibleEmployment.be  This test is no t yet approved or cleared by the [place] FDA and  has been authorized for detection and/or diagnosis of SARS-CoV-2 by FDA under an Emergency Use Authorization (EUA). This EUA will remain  in effect (meaning this test can be used) for the duration of the COVID-19 declaration under Section 564(b)(1) of the Act, 21 U.S.C.section 360bbb-3(b)(1), unless the authorization is terminated  or revoked sooner.       Influenza A by PCR NEGATIVE NEGATIVE Final   Influenza B by PCR NEGATIVE NEGATIVE Final    Comment: (NOTE) The Xpert Xpress SARS-CoV-2/FLU/RSV plus assay is intended as an aid in the diagnosis of influenza from Nasopharyngeal swab specimens and should not be used as a sole basis for treatment. Nasal washings and aspirates are  unacceptable for Xpert Xpress SARS-CoV-2/FLU/RSV testing.  Fact Sheet for Patients: EntrepreneurPulse.com.au  Fact Sheet for Healthcare Providers: IncredibleEmployment.be  This test is not yet approved or cleared by the [place] FDA and has been authorized for detection and/or diagnosis of SARS-CoV-2 by FDA under an Emergency Use Authorization (EUA). This EUA will remain in effect (meaning this test can be used) for the duration of the COVID-19 declaration under Section 564(b)(1) of the Act, 21 U.S.C. section 360bbb-3(b)(1), unless the authorization is terminated or revoked.  Performed at [facility], [address] [place], [place] [address]          Radiology Studies: CT HEAD WO CONTRAST (5MM)  Result Date: 04/22/2021 CLINICAL DATA:  Delirium EXAM: CT HEAD WITHOUT CONTRAST TECHNIQUE: Contiguous axial images were obtained from the base of the skull through the vertex without intravenous contrast. COMPARISON:  None. FINDINGS: Brain: The brain shows a normal appearance without evidence of malformation, atrophy, old or acute small or large vessel infarction, mass lesion, hemorrhage, hydrocephalus or extra-axial collection. Vascular: There is atherosclerotic calcification of the major vessels at the base of the brain. Skull: Normal.  No traumatic finding.  No focal bone lesion. Sinuses/Orbits: Sinuses are clear. Orbits appear normal. Mastoids are clear. Other: None significant IMPRESSION: No acute or focal finding. Atherosclerotic calcification of the major vessels at the base of the brain. Electronically Signed   By: Nelson Chimes M.D.   On: 04/22/2021 15:54   DG Chest Port 1 View  Result Date: 04/22/2021 CLINICAL DATA:  Acute respiratory failure EXAM: PORTABLE CHEST 1 VIEW COMPARISON:  04/19/2021, CT 04/05/2021, 08/14/2018 FINDINGS: Low lung volumes. Cardiomegaly. Underlying pulmonary fibrosis and chronic interstitial lung disease.  Improved superimposed ground-glass opacities since 04/05/2021. Aortic atherosclerosis. No pneumothorax. IMPRESSION: 1. Underlying chronic lung disease though with improved aeration since 04/05/2021 consistent with decreased superimposed edema or infection 2. Cardiomegaly Electronically Signed   By: Donavan Foil M.D.   On: 04/22/2021 21:07        Scheduled Meds:  aspirin EC  81 mg Oral Daily   atorvastatin  80 mg Oral QHS   calcitRIOL  0.25 mcg Oral Daily   carvedilol  18.75 mg Oral BID WC   cloNIDine  0.3 mg Oral TID  clopidogrel  75 mg Oral Daily   dorzolamide-timolol  1 drop Both Eyes BID   enoxaparin (LOVENOX) injection  30 mg Subcutaneous Q24H   ezetimibe  10 mg Oral Daily   fluconazole  100 mg Oral Daily   fluticasone furoate-vilanterol  1 puff Inhalation Daily   gabapentin  100 mg Oral BID   hydrALAZINE  100 mg Oral TID   insulin aspart  0-15 Units Subcutaneous TID WC   insulin aspart  0-5 Units Subcutaneous QHS   insulin aspart  8 Units Subcutaneous TID WC   insulin glargine-yfgn  35 Units Subcutaneous QHS   ipratropium-albuterol  3 mL Nebulization Q6H   isosorbide mononitrate  120 mg Oral Daily   [START ON 04/24/2021] levothyroxine  25 mcg Oral Weekly   levothyroxine  50 mcg Oral Once per day on Mon Tue Wed Thu Fri Sat   montelukast  10 mg Oral Daily   multivitamin with minerals  1 tablet Oral Daily   pantoprazole  40 mg Oral Daily   potassium chloride  20 mEq Oral Daily   predniSONE  40 mg Oral Q breakfast   Ensure Max Protein  11 oz Oral BID   sodium chloride flush  3 mL Intravenous Q12H   tamsulosin  0.4 mg Oral QHS   Continuous Infusions:  sodium chloride     sodium chloride 75 mL/hr at 04/23/21 0428   cefTRIAXone (ROCEPHIN)  IV       LOS: 6 days    Time spent: 30 mins     Wyvonnia Dusky, MD [facility] Pager [phone]  If 7PM-7AM, please contact night-coverage 04/23/2021, 8:22 AM

## 2021-04-24 DIAGNOSIS — I214 Non-ST elevation (NSTEMI) myocardial infarction: Secondary | ICD-10-CM | POA: Diagnosis not present

## 2021-04-24 DIAGNOSIS — R0602 Shortness of breath: Secondary | ICD-10-CM

## 2021-04-24 DIAGNOSIS — N390 Urinary tract infection, site not specified: Secondary | ICD-10-CM | POA: Diagnosis not present

## 2021-04-24 LAB — BASIC METABOLIC PANEL WITH GFR
Anion gap: 6 (ref 5–15)
BUN: 84 mg/dL — ABNORMAL HIGH (ref 8–23)
CO2: 28 mmol/L (ref 22–32)
Calcium: 8.3 mg/dL — ABNORMAL LOW (ref 8.9–10.3)
Chloride: 103 mmol/L (ref 98–111)
Creatinine, Ser: 3.16 mg/dL — ABNORMAL HIGH (ref 0.61–1.24)
GFR, Estimated: 20 mL/min — ABNORMAL LOW
Glucose, Bld: 315 mg/dL — ABNORMAL HIGH (ref 70–99)
Potassium: 4.2 mmol/L (ref 3.5–5.1)
Sodium: 137 mmol/L (ref 135–145)

## 2021-04-24 LAB — CBC
HCT: 31.3 % — ABNORMAL LOW (ref 39.0–52.0)
Hemoglobin: 10.1 g/dL — ABNORMAL LOW (ref 13.0–17.0)
MCH: 28.1 pg (ref 26.0–34.0)
MCHC: 32.3 g/dL (ref 30.0–36.0)
MCV: 87.2 fL (ref 80.0–100.0)
Platelets: 144 10*3/uL — ABNORMAL LOW (ref 150–400)
RBC: 3.59 MIL/uL — ABNORMAL LOW (ref 4.22–5.81)
RDW: 15.5 % (ref 11.5–15.5)
WBC: 13.3 10*3/uL — ABNORMAL HIGH (ref 4.0–10.5)
nRBC: 0 % (ref 0.0–0.2)

## 2021-04-24 LAB — GLUCOSE, CAPILLARY
Glucose-Capillary: 261 mg/dL — ABNORMAL HIGH (ref 70–99)
Glucose-Capillary: 367 mg/dL — ABNORMAL HIGH (ref 70–99)
Glucose-Capillary: 326 mg/dL — ABNORMAL HIGH (ref 70–99)
Glucose-Capillary: 291 mg/dL — ABNORMAL HIGH (ref 70–99)

## 2021-04-24 MED ORDER — DOCUSATE SODIUM 100 MG PO CAPS
200.0000 mg | ORAL_CAPSULE | Freq: Two times a day (BID) | ORAL | Status: DC | PRN
  Administered 2021-04-24 – 2021-04-26 (×2): 200 mg via ORAL
  Filled 2021-04-24 (×2): qty 2

## 2021-04-24 MED ORDER — SALINE SPRAY 0.65 % NA SOLN
1.0000 | NASAL | Status: DC | PRN
  Filled 2021-04-24 (×2): qty 44

## 2021-04-24 NOTE — Progress Notes (Signed)
Progress Note  Patient Name: Stephen [name] Date of Encounter: 04/24/2021  [facility] Cardiologist: Ida Rogue, MD   Subjective   Improvement in mentation, has not spent much time out of bed Eating better yesterday, drinking more Still feels weak Wife at the bedside.  Both wondering when he can go home.  Has not been out of bed working with PT, they feel he has regressed in his strength compared to 4 days ago  Inpatient Medications    Scheduled Meds:  aspirin EC  81 mg Oral Daily   atorvastatin  80 mg Oral QHS   calcitRIOL  0.25 mcg Oral Daily   carvedilol  18.75 mg Oral BID WC   cloNIDine  0.3 mg Oral TID   clopidogrel  75 mg Oral Daily   dorzolamide-timolol  1 drop Both Eyes BID   enoxaparin (LOVENOX) injection  30 mg Subcutaneous Q24H   ezetimibe  10 mg Oral Daily   fluconazole  100 mg Oral Daily   fluticasone furoate-vilanterol  1 puff Inhalation Daily   gabapentin  100 mg Oral BID   hydrALAZINE  100 mg Oral TID   insulin aspart  0-15 Units Subcutaneous TID WC   insulin aspart  0-5 Units Subcutaneous QHS   insulin aspart  8 Units Subcutaneous TID WC   insulin glargine-yfgn  35 Units Subcutaneous QHS   ipratropium-albuterol  3 mL Nebulization Q6H   isosorbide mononitrate  120 mg Oral Daily   levothyroxine  25 mcg Oral Weekly   levothyroxine  50 mcg Oral Once per day on Mon Tue Wed Thu Fri Sat   montelukast  10 mg Oral Daily   multivitamin with minerals  1 tablet Oral Daily   pantoprazole  40 mg Oral Daily   Ensure Max Protein  11 oz Oral BID   sodium chloride flush  3 mL Intravenous Q12H   tamsulosin  0.4 mg Oral QHS   Continuous Infusions:  sodium chloride     cefTRIAXone (ROCEPHIN)  IV 1 g (04/24/21 0835)   PRN Meds: sodium chloride, acetaminophen, albuterol, docusate sodium, fluticasone, guaiFENesin-dextromethorphan, hydrALAZINE, HYDROcodone-acetaminophen, nitroGLYCERIN, ondansetron (ZOFRAN) IV, sodium chloride, sodium chloride flush   Vital  Signs    Vitals:   04/24/21 0423 04/24/21 0500 04/24/21 0741 04/24/21 1135  BP: (!) 136/59  (!) 130/54 (!) 131/46  Pulse: (!) 58  (!) 57 63  Resp: 18  16 16   Temp: 97.7 F (36.5 C)  98.2 F (36.8 C) 98.3 F (36.8 C)  TempSrc:    Oral  SpO2: 98%  98% 96%  Weight:  123.3 kg    Height:        Intake/Output Summary (Last 24 hours) at 04/24/2021 1322 Last data filed at 04/24/2021 1134 Gross per 24 hour  Intake 1670.06 ml  Output 1675 ml  Net -4.94 ml   Last 3 Weights 04/24/2021 04/23/2021 04/22/2021  Weight (lbs) 271 lb 13.2 oz 267 lb 10.2 oz 265 lb 3.4 oz  Weight (kg) 123.3 kg 121.4 kg 120.3 kg  Some encounter information is confidential and restricted. Go to Review Flowsheets activity to see all data.      Telemetry    Normal sinus rhythm- Personally Reviewed  ECG    - Personally Reviewed  Physical Exam   Constitutional:  oriented to person, place, and time. No distress.  HENT:  Head: Grossly normal Eyes:  no discharge. No scleral icterus.  Neck: No JVD, no carotid bruits  Cardiovascular: Regular rate and rhythm, no  murmurs appreciated Pulmonary/Chest: Clear to auscultation bilaterally, no wheezes or rails Abdominal: Soft.  no distension.  no tenderness.  Musculoskeletal: Normal range of motion Neurological:  normal muscle tone. Coordination normal. No atrophy Skin: Skin warm and dry Psychiatric: normal affect, pleasant   Labs    High Sensitivity Troponin:   Recent Labs  Lab 04/05/21 2016 04/17/21 2101 04/18/21 0505 04/18/21 0658 04/18/21 1240  TROPONINIHS 107* 74* 2,129* 1,997* 1,842*     Chemistry Recent Labs  Lab 04/17/21 2101 04/18/21 0505 04/22/21 2058 04/23/21 0346 04/24/21 0406  NA 141   < > 136 138 137  K 4.0   < > 4.6 4.3 4.2  CL 105   < > 99 100 103  CO2 27   < > 28 29 28   GLUCOSE 216*   < > 293* 262* 315*  BUN 36*   < > 78* 81* 84*  CREATININE 2.66*   < > 3.27* 3.37* 3.16*  CALCIUM 8.3*   < > 8.5* 8.6* 8.3*  MG  --   --  2.2   --   --   PROT 6.8  --  6.1*  --   --   ALBUMIN 2.9*  --  2.5*  --   --   AST 22  --  20  --   --   ALT 17  --  26  --   --   ALKPHOS 84  --  51  --   --   BILITOT 0.7  --  0.6  --   --   GFRNONAA 25*   < > 19* 18* 20*  ANIONGAP 9   < > 9 9 6    < > = values in this interval not displayed.    Lipids No results for input(s): CHOL, TRIG, HDL, LABVLDL, LDLCALC, CHOLHDL in the last 168 hours.  Hematology Recent Labs  Lab 04/22/21 2058 04/23/21 0346 04/24/21 0406  WBC 14.8* 13.5* 13.3*  RBC 3.53* 3.47* 3.59*  HGB 9.9* 9.8* 10.1*  HCT 30.9* 31.1* 31.3*  MCV 87.5 89.6 87.2  MCH 28.0 28.2 28.1  MCHC 32.0 31.5 32.3  RDW 15.4 15.6* 15.5  PLT 151 160 144*   Thyroid  Recent Labs  Lab 04/23/21 0346  TSH 1.361  FREET4 0.82    BNP Recent Labs  Lab 04/17/21 2058 04/19/21 0359  BNP 250.6* 680.2*    DDimer No results for input(s): DDIMER in the last 168 hours.   Radiology    CT HEAD WO CONTRAST (5MM)  Result Date: 04/22/2021 CLINICAL DATA:  Delirium EXAM: CT HEAD WITHOUT CONTRAST TECHNIQUE: Contiguous axial images were obtained from the base of the skull through the vertex without intravenous contrast. COMPARISON:  None. FINDINGS: Brain: The brain shows a normal appearance without evidence of malformation, atrophy, old or acute small or large vessel infarction, mass lesion, hemorrhage, hydrocephalus or extra-axial collection. Vascular: There is atherosclerotic calcification of the major vessels at the base of the brain. Skull: Normal.  No traumatic finding.  No focal bone lesion. Sinuses/Orbits: Sinuses are clear. Orbits appear normal. Mastoids are clear. Other: None significant IMPRESSION: No acute or focal finding. Atherosclerotic calcification of the major vessels at the base of the brain. Electronically Signed   By: Nelson Chimes M.D.   On: 04/22/2021 15:54   DG Chest Port 1 View  Result Date: 04/22/2021 CLINICAL DATA:  Acute respiratory failure EXAM: PORTABLE CHEST 1 VIEW  COMPARISON:  04/19/2021, CT 04/05/2021, 08/14/2018 FINDINGS: Low lung volumes. Cardiomegaly. Underlying  pulmonary fibrosis and chronic interstitial lung disease. Improved superimposed ground-glass opacities since 04/05/2021. Aortic atherosclerosis. No pneumothorax. IMPRESSION: 1. Underlying chronic lung disease though with improved aeration since 04/05/2021 consistent with decreased superimposed edema or infection 2. Cardiomegaly Electronically Signed   By: Donavan Foil M.D.   On: 04/22/2021 21:07    Cardiac Studies     Patient Profile  75 y.o. male with a hx of  pulmonary hypertension, chronic diastolic CHF, CAD, pulmonary fibrosis s/p wedge resection, obesity, COPD on home oxygen, OSA on CPAP, DM2, CKD, renal artery stenosis, and who is being seen today for the evaluation of chest pain and elevated HS Tn.   1.  Acute on chronic HFpEF/pulmonary arterial hypertension:  -Treated with IV Lasix twice daily, held several days ago for worsening renal dysfunction, hypercapnia, encephalopathy, urinary tract infection -Given creatinine above his baseline, will continue to hold Lasix -Once he has made a full recovery, will reinitiate Lasix 160 mg in the a.m. and 40 in the p.m., as his previous home dose was 160 mg daily.   2.  CAD/non-STEMI:  Known severe disease Admitted with chest pain and dyspnea.  Peak high-sensitivity troponin of 2129.   Cath in February with moderate LAD and RCA disease and occluded left circumflex with right to left and left to left collaterals.   poor candidate for catheterization given renal dysfunction  Continue medications as detailed; aspirin, Plavix, beta-blocker, statin, and nitrate.    3.  Essential hypertension:  Continue current doses of clonidine, hydralazine, and isosorbide, Coreg Blood pressure stable   4.  Hyperlipidemia:  LDL of 69 in June 2022.  On statin.  5.  Stage IV chronic kidney disease:  Diuretics held, renal function above baseline Improvement in  creatinine on today's labs unable to exclude Possible component of ATN in the setting of UTI Oral intake picking up   6.  Interstitial lung disease/pulmonary fibrosis:  Steroids/nebulizers per internal medicine.,  Requiring BiPAP in the setting of hypercapnia and encephalopathy -Appears to be improving, he is off BiPAP past several days  7.  Obstructive sleep apnea:  Uses CPAP at night.   Total encounter time more than 35 minutes  Greater than 50% was spent in counseling and coordination of care with the patient   For questions or updates, please contact [place] Please consult [email or web address] for contact info under        Signed, Ida Rogue, MD  04/24/2021, 1:22 PM

## 2021-04-24 NOTE — Progress Notes (Addendum)
PROGRESS NOTE    Stephen [name]  WTU:[id number] DOB: [birth year] DOA: 04/17/2021 PCP: [name], MD    Assessment & Plan:   Active Problems:   Type 2 diabetes mellitus with renal complication (HCC)   OSA (obstructive sleep apnea)   CAD (coronary artery disease)   Essential hypertension, benign   Acute on chronic heart failure with preserved ejection fraction (HFpEF) (HCC)   Pulmonary hypertension, primary (HCC)   Acute on chronic respiratory failure with hypoxemia (HCC)   CKD (chronic kidney disease) stage 4, GFR 15-29 ml/min (HCC)   ILD (interstitial lung disease) (HCC)   COPD with acute exacerbation ([place])   NSTEMI: w/ hx of CAD. EF 17-91%, grade I diastolic dysfunction.  Continue on coreg, imdur, plavix, statin & zetia. D/c IV heparin. No cardiac cath now secondary to CKD & unable to lay flat due to orthopnea as per cardio    Acute on chronic hypoxic & hypercapnic respiratory failure: presenting in acute respiratory distress, speaking in short sentences, requiring BiPAP. Weaned off of BiPAP again. Etiology likely secondary to COPD,  acute on chronic diastolic CHF exacerbation superimposed on chronic pulmon conditions, ILD, PAH. Completed steroid course. Lasix on hold secondary to AKI on CKD. Continue on bronchodilators & encourage incentive spirometry    Encephalopathy: etiology unclear, possibly metabolic vs retention of CO2 vs UTI. CT head neg for any acute intracranial findings. Weaned off of BiPAP. Improving daily   UTI: urine cx is growing e.coli & s. Marcescens. Continue on IV rocephin     Acute on chronic diastolic CHF: continue to hold lasix today. Cr is trending down slightly. Monitor I/Os and daily weights. Cardio recs apprec    HTN emergency: resolved but still w/ HTN. Continue on hydralazine, coreg & imdur   COPD exacerbation: completed steroid course. Continue on bronchodilators and incentive spirometry     Pulmon disease: w/ hx of ILD, pulmonary HTN.  Continue to hold sildanefil   AKI on CKDIV: baseline Cr 2.6. Cr is trending down slightly from day prior   DM2: likely poorly controlled. Continue on glargine, SSI w/ accuchecks   Thrombocytopenia: etiology unclear. Will continue to monitor   OSA: uses CPAP qhs at home   Morbid obesity: BMI 45.2. Complicates overall care & prognosis   DVT prophylaxis: heparin  Code Status: full  Family Communication: discussed pt's care w/ pt's wife at bedside and answered her questions  Disposition Plan: likely d/c back home   Level of care: Progressive Cardiac  Status is: Inpatient  Remains inpatient appropriate because: secondary respiratory status, severity of illness     Consultants:  Cardio   Procedures:   Antimicrobials:    Subjective: Pt c/o fatigue   Objective: Vitals:   04/24/21 0202 04/24/21 0423 04/24/21 0500 04/24/21 0741  BP:  (!) 136/59  (!) 130/54  Pulse: (!) 54 (!) 58  (!) 57  Resp:  18  16  Temp:  97.7 F (36.5 C)  98.2 F (36.8 C)  TempSrc:      SpO2: 96% 98%  98%  Weight:   123.3 kg   Height:        Intake/Output Summary (Last 24 hours) at 04/24/2021 0828 Last data filed at 04/23/2021 1900 Gross per 24 hour  Intake 2009.84 ml  Output 1775 ml  Net 234.84 ml   Filed Weights   04/22/21 0500 04/23/21 0503 04/24/21 0500  Weight: 120.3 kg 121.4 kg 123.3 kg    Examination:  General exam: Appears comfortable. Morbidly  obese Respiratory system: diminished breath sounds b/l  Cardiovascular system: S1 & S2+. No rubs or clicks  Gastrointestinal system: Abd is soft, NT, obese & hypoactive bowel sounds  Central nervous system: Alert and awake. Moves all extremities  Psychiatry: Judgement and insight appears poor. Flat mood and affect    Data Reviewed: I have personally reviewed following labs and imaging studies  CBC: Recent Labs  Lab 04/17/21 2101 04/19/21 0259 04/21/21 0619 04/22/21 0513 04/22/21 2058 04/23/21 0346 04/24/21 0406  WBC 6.0    < > 12.2* 13.3* 14.8* 13.5* 13.3*  NEUTROABS 4.8  --   --   --  13.7*  --   --   HGB 9.9*   < > 10.7* 10.3* 9.9* 9.8* 10.1*  HCT 31.4*   < > 32.4* 31.1* 30.9* 31.1* 31.3*  MCV 92.4   < > 88.3 88.6 87.5 89.6 87.2  PLT 153   < > 175 150 151 160 144*   < > = values in this interval not displayed.   Basic Metabolic Panel: Recent Labs  Lab 04/21/21 0619 04/22/21 0513 04/22/21 2058 04/23/21 0346 04/24/21 0406  NA 134* 135 136 138 137  K 3.8 4.0 4.6 4.3 4.2  CL 100 101 99 100 103  CO2 29 29 28 29 28   GLUCOSE 302* 365* 293* 262* 315*  BUN 70* 72* 78* 81* 84*  CREATININE 2.99* 3.10* 3.27* 3.37* 3.16*  CALCIUM 8.3* 8.5* 8.5* 8.6* 8.3*  MG  --   --  2.2  --   --    GFR: Estimated Creatinine Clearance: 25.4 mL/min (A) (by C-G formula based on SCr of 3.16 mg/dL (H)). Liver Function Tests: Recent Labs  Lab 04/17/21 2101 04/22/21 2058  AST 22 20  ALT 17 26  ALKPHOS 84 51  BILITOT 0.7 0.6  PROT 6.8 6.1*  ALBUMIN 2.9* 2.5*   No results for input(s): LIPASE, AMYLASE in the last 168 hours. Recent Labs  Lab 04/22/21 2058  AMMONIA 22   Coagulation Profile: Recent Labs  Lab 04/18/21 0623  INR 1.1   Cardiac Enzymes: No results for input(s): CKTOTAL, CKMB, CKMBINDEX, TROPONINI in the last 168 hours. BNP (last 3 results) No results for input(s): PROBNP in the last 8760 hours. HbA1C: No results for input(s): HGBA1C in the last 72 hours. CBG: Recent Labs  Lab 04/23/21 0811 04/23/21 1112 04/23/21 1634 04/23/21 2158 04/24/21 0811  GLUCAP 226* 295* 415* 399* 261*   Lipid Profile: No results for input(s): CHOL, HDL, LDLCALC, TRIG, CHOLHDL, LDLDIRECT in the last 72 hours. Thyroid Function Tests: Recent Labs    04/23/21 0346  TSH 1.361  FREET4 0.82   Anemia Panel: Recent Labs    04/23/21 0346  VITAMINB12 148*  TIBC 196*  IRON 14*   Sepsis Labs: Recent Labs  Lab 04/17/21 2101 04/18/21 0658 04/22/21 1634  PROCALCITON  --   --  0.22  LATICACIDVEN 1.2 0.9  --      Recent Results (from the past 240 hour(s))  Resp Panel by RT-PCR (Flu A&B, Covid) Nasopharyngeal Swab     Status: None   Collection Time: 04/17/21  8:58 PM   Specimen: Nasopharyngeal Swab; Nasopharyngeal(NP) swabs in vial transport medium  Result Value Ref Range Status   SARS Coronavirus 2 by RT PCR NEGATIVE NEGATIVE Final    Comment: (NOTE) SARS-CoV-2 target nucleic acids are NOT DETECTED.  The SARS-CoV-2 RNA is generally detectable in upper respiratory specimens during the acute phase of infection. The lowest concentration of SARS-CoV-2  viral copies this assay can detect is 138 copies/mL. A negative result does not preclude SARS-Cov-2 infection and should not be used as the sole basis for treatment or other patient management decisions. A negative result may occur with  improper specimen collection/handling, submission of specimen other than nasopharyngeal swab, presence of viral mutation(s) within the areas targeted by this assay, and inadequate number of viral copies(<138 copies/mL). A negative result must be combined with clinical observations, patient history, and epidemiological information. The expected result is Negative.  Fact Sheet for Patients:  EntrepreneurPulse.com.au  Fact Sheet for Healthcare Providers:  IncredibleEmployment.be  This test is no t yet approved or cleared by the [place] FDA and  has been authorized for detection and/or diagnosis of SARS-CoV-2 by FDA under an Emergency Use Authorization (EUA). This EUA will remain  in effect (meaning this test can be used) for the duration of the COVID-19 declaration under Section 564(b)(1) of the Act, 21 U.S.C.section 360bbb-3(b)(1), unless the authorization is terminated  or revoked sooner.       Influenza A by PCR NEGATIVE NEGATIVE Final   Influenza B by PCR NEGATIVE NEGATIVE Final    Comment: (NOTE) The Xpert Xpress SARS-CoV-2/FLU/RSV plus assay is intended as an  aid in the diagnosis of influenza from Nasopharyngeal swab specimens and should not be used as a sole basis for treatment. Nasal washings and aspirates are unacceptable for Xpert Xpress SARS-CoV-2/FLU/RSV testing.  Fact Sheet for Patients: EntrepreneurPulse.com.au  Fact Sheet for Healthcare Providers: IncredibleEmployment.be  This test is not yet approved or cleared by the [place] FDA and has been authorized for detection and/or diagnosis of SARS-CoV-2 by FDA under an Emergency Use Authorization (EUA). This EUA will remain in effect (meaning this test can be used) for the duration of the COVID-19 declaration under Section 564(b)(1) of the Act, 21 U.S.C. section 360bbb-3(b)(1), unless the authorization is terminated or revoked.  Performed at [facility], [address] [place], [place] [address]          Radiology Studies: CT HEAD WO CONTRAST (5MM)  Result Date: 04/22/2021 CLINICAL DATA:  [place] EXAM: CT HEAD WITHOUT CONTRAST TECHNIQUE: Contiguous axial images were obtained from the base of the skull through the vertex without intravenous contrast. COMPARISON:  None. FINDINGS: Brain: The brain shows a normal appearance without evidence of malformation, atrophy, old or acute small or large vessel infarction, mass lesion, hemorrhage, hydrocephalus or extra-axial collection. Vascular: There is atherosclerotic calcification of the major vessels at the base of the brain. Skull: Normal.  No traumatic finding.  No focal bone lesion. Sinuses/Orbits: Sinuses are clear. Orbits appear normal. Mastoids are clear. Other: None significant IMPRESSION: No acute or focal finding. Atherosclerotic calcification of the major vessels at the base of the brain. Electronically Signed   By: Nelson Chimes M.D.   On: 04/22/2021 15:54   DG Chest Port 1 View  Result Date: 04/22/2021 CLINICAL DATA:  Acute respiratory failure EXAM: PORTABLE CHEST 1 VIEW  COMPARISON:  04/19/2021, CT 04/05/2021, 08/14/2018 FINDINGS: Low lung volumes. Cardiomegaly. Underlying pulmonary fibrosis and chronic interstitial lung disease. Improved superimposed ground-glass opacities since 04/05/2021. Aortic atherosclerosis. No pneumothorax. IMPRESSION: 1. Underlying chronic lung disease though with improved aeration since 04/05/2021 consistent with decreased superimposed edema or infection 2. Cardiomegaly Electronically Signed   By: Donavan Foil M.D.   On: 04/22/2021 21:07        Scheduled Meds:  aspirin EC  81 mg Oral Daily   atorvastatin  80 mg Oral QHS  calcitRIOL  0.25 mcg Oral Daily   carvedilol  18.75 mg Oral BID WC   cloNIDine  0.3 mg Oral TID   clopidogrel  75 mg Oral Daily   dorzolamide-timolol  1 drop Both Eyes BID   enoxaparin (LOVENOX) injection  30 mg Subcutaneous Q24H   ezetimibe  10 mg Oral Daily   fluconazole  100 mg Oral Daily   fluticasone furoate-vilanterol  1 puff Inhalation Daily   gabapentin  100 mg Oral BID   hydrALAZINE  100 mg Oral TID   insulin aspart  0-15 Units Subcutaneous TID WC   insulin aspart  0-5 Units Subcutaneous QHS   insulin aspart  8 Units Subcutaneous TID WC   insulin glargine-yfgn  35 Units Subcutaneous QHS   ipratropium-albuterol  3 mL Nebulization Q6H   isosorbide mononitrate  120 mg Oral Daily   levothyroxine  25 mcg Oral Weekly   levothyroxine  50 mcg Oral Once per day on Mon Tue Wed Thu Fri Sat   montelukast  10 mg Oral Daily   multivitamin with minerals  1 tablet Oral Daily   pantoprazole  40 mg Oral Daily   Ensure Max Protein  11 oz Oral BID   sodium chloride flush  3 mL Intravenous Q12H   tamsulosin  0.4 mg Oral QHS   Continuous Infusions:  sodium chloride     cefTRIAXone (ROCEPHIN)  IV 1 g (04/23/21 2226)     LOS: 7 days    Time spent: 25 mins     Wyvonnia Dusky, MD [facility] Pager [phone]  If 7PM-7AM, please contact night-coverage 04/24/2021, 8:28 AM

## 2021-04-25 ENCOUNTER — Ambulatory Visit: Payer: Medicare HMO | Admitting: Family

## 2021-04-25 ENCOUNTER — Inpatient Hospital Stay: Payer: Medicare HMO

## 2021-04-25 DIAGNOSIS — N390 Urinary tract infection, site not specified: Secondary | ICD-10-CM | POA: Diagnosis not present

## 2021-04-25 DIAGNOSIS — I214 Non-ST elevation (NSTEMI) myocardial infarction: Secondary | ICD-10-CM | POA: Diagnosis not present

## 2021-04-25 LAB — BASIC METABOLIC PANEL WITH GFR
Anion gap: 11 (ref 5–15)
BUN: 89 mg/dL — ABNORMAL HIGH (ref 8–23)
CO2: 26 mmol/L (ref 22–32)
Calcium: 8.3 mg/dL — ABNORMAL LOW (ref 8.9–10.3)
Chloride: 99 mmol/L (ref 98–111)
Creatinine, Ser: 3.17 mg/dL — ABNORMAL HIGH (ref 0.61–1.24)
GFR, Estimated: 20 mL/min — ABNORMAL LOW
Glucose, Bld: 232 mg/dL — ABNORMAL HIGH (ref 70–99)
Potassium: 4.4 mmol/L (ref 3.5–5.1)
Sodium: 136 mmol/L (ref 135–145)

## 2021-04-25 LAB — URINE CULTURE: Culture: 50000 — AB

## 2021-04-25 LAB — CBC
HCT: 32.5 % — ABNORMAL LOW (ref 39.0–52.0)
Hemoglobin: 10.4 g/dL — ABNORMAL LOW (ref 13.0–17.0)
MCH: 28.1 pg (ref 26.0–34.0)
MCHC: 32 g/dL (ref 30.0–36.0)
MCV: 87.8 fL (ref 80.0–100.0)
Platelets: 174 10*3/uL (ref 150–400)
RBC: 3.7 MIL/uL — ABNORMAL LOW (ref 4.22–5.81)
RDW: 15.7 % — ABNORMAL HIGH (ref 11.5–15.5)
WBC: 10.3 10*3/uL (ref 4.0–10.5)
nRBC: 0 % (ref 0.0–0.2)

## 2021-04-25 LAB — GLUCOSE, CAPILLARY
Glucose-Capillary: 222 mg/dL — ABNORMAL HIGH (ref 70–99)
Glucose-Capillary: 271 mg/dL — ABNORMAL HIGH (ref 70–99)
Glucose-Capillary: 225 mg/dL — ABNORMAL HIGH (ref 70–99)
Glucose-Capillary: 229 mg/dL — ABNORMAL HIGH (ref 70–99)

## 2021-04-25 MED ORDER — INSULIN GLARGINE-YFGN 100 UNIT/ML ~~LOC~~ SOLN
45.0000 [IU] | Freq: Every day | SUBCUTANEOUS | Status: DC
  Administered 2021-04-25 – 2021-04-28 (×4): 45 [IU] via SUBCUTANEOUS
  Filled 2021-04-25 (×5): qty 0.45

## 2021-04-25 NOTE — TOC Progression Note (Signed)
Transition of Care [facility]) - Progression Note    Patient Details  Name: Stephen [name] MRN: [id number] Date of Birth: May 31, 1946  Transition [facility] CM/SW [place], [place] Phone Number: 04/25/2021, 2:16 PM  Clinical Narrative:     CSW spoke with patient's spouse Stephen [name] regarding PT/OT updated recs of SNF. She reports patient and her still plan to go home, is already set up with [facility] for PT and OT.   Spouse Stephen [name] reports wanting either a walker or wheel chair for patient, and is interested in pricing for those and for hospital bed and hoyer lift. CSW has reached out to [employer] who will follow up with Stephen [name] on pricing so she can decided.     Expected Discharge Plan: [place] Barriers to Discharge: Continued Medical Work up  Expected Discharge Plan and Services Expected Discharge Plan: [place] In-house Referral: Clinical Social Work   Post Acute Care Choice: [place] arrangements for the past 2 months: Single Family Home                                       Social Determinants of Health (SDOH) Interventions    Readmission Risk Interventions Readmission Risk Prevention Plan 09/20/2018  Transportation Screening Complete  PCP or Specialist Appt within 3-5 Days Complete  HRI or [place] Patient refused  Social Work Consult for [place] Planning/Counseling Patient refused  Palliative Care Screening Not Applicable  Medication Review Press photographer) Complete  Some recent data might be hidden

## 2021-04-25 NOTE — Progress Notes (Addendum)
PROGRESS NOTE    Stephen [name]  VFI:[id number] DOB: 08-20-1945 DOA: 04/17/2021 PCP: [name], MD    Assessment & Plan:   Active Problems:   Type 2 diabetes mellitus with renal complication (HCC)   OSA (obstructive sleep apnea)   CAD (coronary artery disease)   Essential hypertension, benign   Acute on chronic heart failure with preserved ejection fraction (HFpEF) (HCC)   Pulmonary hypertension, primary (HCC)   Acute on chronic respiratory failure with hypoxemia (HCC)   CKD (chronic kidney disease) stage 4, GFR 15-29 ml/min (HCC)   ILD (interstitial lung disease) (HCC)   COPD with acute exacerbation ([place])   NSTEMI: w/ hx of CAD. EF 41-66%, grade I diastolic dysfunction. Continue on coreg, imdur, plavix, statin & zetia. D/c IV heparin. No cardiac cath secondary to CKD as per cardio    Acute on chronic hypoxic & hypercapnic respiratory failure: presenting in acute respiratory distress, speaking in short sentences, requiring BiPAP. Weaned off of BiPAP again. Etiology likely secondary to COPD,  acute on chronic diastolic CHF exacerbation superimposed on chronic pulmon conditions, ILD, PAH. Completed steroid course. Lasix on hold secondary to AKI on CKD. Continue on bronchodilators & encourage incentive spirometry    Encephalopathy: etiology unclear, possibly metabolic vs retention of CO2 vs UTI. CT head neg for any acute intracranial findings. Weaned off of BiPAP. Mental status is labile   UTI: urine cx is growing e.coli & s. marcescens. Continue on IV rocephin. Spiking fevers. Blood cxs ordered   Acute on chronic diastolic CHF: continue to hold lasix today. Cr is trending down slightly. Monitor I/Os and daily weights. Cardio recs apprec    HTN emergency: resolved but still w/ HTN. Continue on imdur, coreg, hydralazine   COPD exacerbation: completed steroid course. Continue on bronchodilators and incentive spirometry     Pulmon disease: w/ hx of ILD, pulmonary HTN. Continue  to hold sildanefil   AKI on CKDIV: baseline Cr 2.6. Cr is labile. Avoid nephrotoxic meds   DM2: likely poorly controlled. Continue on glargine, SSI w/ accuchecks   Thrombocytopenia: resolved  OSA: uses CPAP qhs at home   Morbid obesity: BMI 45.3. Complicates overall care & prognosis    DVT prophylaxis: heparin  Code Status: full  Family Communication: discussed pt's care w/ pt's wife at bedside and answered her questions  Disposition Plan: likely d/c back home as pt and pt's wife are refusing SNF  Level of care: Progressive Cardiac  Status is: Inpatient  Remains inpatient appropriate because: secondary respiratory status, severity of illness     Consultants:  Cardio   Procedures:   Antimicrobials:    Subjective: Pt c/o fatigue   Objective: Vitals:   04/25/21 0858 04/25/21 0913 04/25/21 1101 04/25/21 1519  BP: (!) 134/57  (!) 111/99 (!) 123/58  Pulse: 70  64 64  Resp: 17  17 19   Temp: 98.1 F (36.7 C)  98.2 F (36.8 C) 98.5 F (36.9 C)  TempSrc: Oral     SpO2: 95% 94% 94% 96%  Weight:      Height:        Intake/Output Summary (Last 24 hours) at 04/25/2021 1529 Last data filed at 04/25/2021 1521 Gross per 24 hour  Intake 720 ml  Output 1600 ml  Net -880 ml   Filed Weights   04/23/21 0503 04/24/21 0500 04/25/21 0433  Weight: 121.4 kg 123.3 kg 123.7 kg    Examination:  General exam: Appears comfortable. Morbidly obese Respiratory system: diminished breath sounds  b/l  Cardiovascular system: S1 & S2+. No rubs or clicks  Gastrointestinal system: Abd is soft, NT, obese & hypoactive bowel sounds  Central nervous system: Alert and awake. Moves all extremities  Psychiatry: Judgement and insight appears poor. Flat mood and affect    Data Reviewed: I have personally reviewed following labs and imaging studies  CBC: Recent Labs  Lab 04/22/21 0513 04/22/21 2058 04/23/21 0346 04/24/21 0406 04/25/21 0741  WBC 13.3* 14.8* 13.5* 13.3* 10.3   NEUTROABS  --  13.7*  --   --   --   HGB 10.3* 9.9* 9.8* 10.1* 10.4*  HCT 31.1* 30.9* 31.1* 31.3* 32.5*  MCV 88.6 87.5 89.6 87.2 87.8  PLT 150 151 160 144* 893   Basic Metabolic Panel: Recent Labs  Lab 04/22/21 0513 04/22/21 2058 04/23/21 0346 04/24/21 0406 04/25/21 0741  NA 135 136 138 137 136  K 4.0 4.6 4.3 4.2 4.4  CL 101 99 100 103 99  CO2 29 28 29 28 26   GLUCOSE 365* 293* 262* 315* 232*  BUN 72* 78* 81* 84* 89*  CREATININE 3.10* 3.27* 3.37* 3.16* 3.17*  CALCIUM 8.5* 8.5* 8.6* 8.3* 8.3*  MG  --  2.2  --   --   --    GFR: Estimated Creatinine Clearance: 25.4 mL/min (A) (by C-G formula based on SCr of 3.17 mg/dL (H)). Liver Function Tests: Recent Labs  Lab 04/22/21 2058  AST 20  ALT 26  ALKPHOS 51  BILITOT 0.6  PROT 6.1*  ALBUMIN 2.5*   No results for input(s): LIPASE, AMYLASE in the last 168 hours. Recent Labs  Lab 04/22/21 2058  AMMONIA 22   Coagulation Profile: No results for input(s): INR, PROTIME in the last 168 hours.  Cardiac Enzymes: No results for input(s): CKTOTAL, CKMB, CKMBINDEX, TROPONINI in the last 168 hours. BNP (last 3 results) No results for input(s): PROBNP in the last 8760 hours. HbA1C: No results for input(s): HGBA1C in the last 72 hours. CBG: Recent Labs  Lab 04/24/21 1137 04/24/21 1638 04/24/21 2149 04/25/21 0757 04/25/21 1104  GLUCAP 367* 326* 291* 222* 271*   Lipid Profile: No results for input(s): CHOL, HDL, LDLCALC, TRIG, CHOLHDL, LDLDIRECT in the last 72 hours. Thyroid Function Tests: Recent Labs    04/23/21 0346  TSH 1.361  FREET4 0.82   Anemia Panel: Recent Labs    04/23/21 0346  VITAMINB12 148*  TIBC 196*  IRON 14*   Sepsis Labs: Recent Labs  Lab 04/22/21 1634  PROCALCITON 0.22    Recent Results (from the past 240 hour(s))  Resp Panel by RT-PCR (Flu A&B, Covid) Nasopharyngeal Swab     Status: None   Collection Time: 04/17/21  8:58 PM   Specimen: Nasopharyngeal Swab; Nasopharyngeal(NP) swabs in  vial transport medium  Result Value Ref Range Status   SARS Coronavirus 2 by RT PCR NEGATIVE NEGATIVE Final    Comment: (NOTE) SARS-CoV-2 target nucleic acids are NOT DETECTED.  The SARS-CoV-2 RNA is generally detectable in upper respiratory specimens during the acute phase of infection. The lowest concentration of SARS-CoV-2 viral copies this assay can detect is 138 copies/mL. A negative result does not preclude SARS-Cov-2 infection and should not be used as the sole basis for treatment or other patient management decisions. A negative result may occur with  improper specimen collection/handling, submission of specimen other than nasopharyngeal swab, presence of viral mutation(s) within the areas targeted by this assay, and inadequate number of viral copies(<138 copies/mL). A negative result must be combined  with clinical observations, patient history, and epidemiological information. The expected result is Negative.  Fact Sheet for Patients:  EntrepreneurPulse.com.au  Fact Sheet for Healthcare Providers:  IncredibleEmployment.be  This test is no t yet approved or cleared by the [place] FDA and  has been authorized for detection and/or diagnosis of SARS-CoV-2 by FDA under an Emergency Use Authorization (EUA). This EUA will remain  in effect (meaning this test can be used) for the duration of the COVID-19 declaration under Section 564(b)(1) of the Act, 21 U.S.C.section 360bbb-3(b)(1), unless the authorization is terminated  or revoked sooner.       Influenza A by PCR NEGATIVE NEGATIVE Final   Influenza B by PCR NEGATIVE NEGATIVE Final    Comment: (NOTE) The Xpert Xpress SARS-CoV-2/FLU/RSV plus assay is intended as an aid in the diagnosis of influenza from Nasopharyngeal swab specimens and should not be used as a sole basis for treatment. Nasal washings and aspirates are unacceptable for Xpert Xpress SARS-CoV-2/FLU/RSV testing.  Fact  Sheet for Patients: EntrepreneurPulse.com.au  Fact Sheet for Healthcare Providers: IncredibleEmployment.be  This test is not yet approved or cleared by the [place] FDA and has been authorized for detection and/or diagnosis of SARS-CoV-2 by FDA under an Emergency Use Authorization (EUA). This EUA will remain in effect (meaning this test can be used) for the duration of the COVID-19 declaration under Section 564(b)(1) of the Act, 21 U.S.C. section 360bbb-3(b)(1), unless the authorization is terminated or revoked.  Performed at [facility], [address] [place], [place] [address]   Urine Culture     Status: Abnormal   Collection Time: 04/22/21 11:53 AM   Specimen: Urine, Clean Catch  Result Value Ref Range Status   Specimen Description   Final    URINE, CLEAN CATCH Performed at [facility], [address], [place], [place] [address]    Special Requests   Final    NONE Performed at [facility], [place], [place] [address]    Culture (A)  Final    50,000 COLONIES/mL ESCHERICHIA COLI >=100,000 COLONIES/mL SERRATIA MARCESCENS    Report Status 04/25/2021 FINAL  Final   Organism ID, Bacteria ESCHERICHIA COLI (A)  Final   Organism ID, Bacteria SERRATIA MARCESCENS (A)  Final      Susceptibility   Escherichia coli - MIC*    AMPICILLIN 4 SENSITIVE Sensitive     CEFAZOLIN <=4 SENSITIVE Sensitive     CEFEPIME <=0.12 SENSITIVE Sensitive     CEFTRIAXONE <=0.25 SENSITIVE Sensitive     CIPROFLOXACIN <=0.25 SENSITIVE Sensitive     GENTAMICIN <=1 SENSITIVE Sensitive     IMIPENEM <=0.25 SENSITIVE Sensitive     NITROFURANTOIN <=16 SENSITIVE Sensitive     TRIMETH/SULFA <=20 SENSITIVE Sensitive     AMPICILLIN/SULBACTAM <=2 SENSITIVE Sensitive     PIP/TAZO <=4 SENSITIVE Sensitive     * 50,000 COLONIES/mL ESCHERICHIA COLI   Serratia marcescens - MIC*    CEFAZOLIN >=64 RESISTANT Resistant     CEFEPIME  <=0.12 SENSITIVE Sensitive     CEFTRIAXONE <=0.25 SENSITIVE Sensitive     CIPROFLOXACIN <=0.25 SENSITIVE Sensitive     GENTAMICIN <=1 SENSITIVE Sensitive     NITROFURANTOIN 128 RESISTANT Resistant     TRIMETH/SULFA <=20 SENSITIVE Sensitive     * >=100,000 COLONIES/mL SERRATIA MARCESCENS         Radiology Studies: No results found.      Scheduled Meds:  aspirin EC  81 mg Oral Daily   atorvastatin  80 mg Oral QHS   calcitRIOL  0.25 mcg Oral Daily   carvedilol  18.75 mg Oral BID WC   cloNIDine  0.3 mg Oral TID   clopidogrel  75 mg Oral Daily   dorzolamide-timolol  1 drop Both Eyes BID   enoxaparin (LOVENOX) injection  30 mg Subcutaneous Q24H   ezetimibe  10 mg Oral Daily   fluconazole  100 mg Oral Daily   fluticasone furoate-vilanterol  1 puff Inhalation Daily   gabapentin  100 mg Oral BID   hydrALAZINE  100 mg Oral TID   insulin aspart  0-15 Units Subcutaneous TID WC   insulin aspart  0-5 Units Subcutaneous QHS   insulin aspart  8 Units Subcutaneous TID WC   insulin glargine-yfgn  45 Units Subcutaneous QHS   ipratropium-albuterol  3 mL Nebulization Q6H   isosorbide mononitrate  120 mg Oral Daily   levothyroxine  25 mcg Oral Weekly   levothyroxine  50 mcg Oral Once per day on Mon Tue Wed Thu Fri Sat   montelukast  10 mg Oral Daily   multivitamin with minerals  1 tablet Oral Daily   pantoprazole  40 mg Oral Daily   Ensure Max Protein  11 oz Oral BID   sodium chloride flush  3 mL Intravenous Q12H   tamsulosin  0.4 mg Oral QHS   Continuous Infusions:  sodium chloride     cefTRIAXone (ROCEPHIN)  IV 1 g (04/25/21 1009)     LOS: 8 days    Time spent: 20 mins     Wyvonnia Dusky, MD [facility] Pager [phone]  If 7PM-7AM, please contact night-coverage 04/25/2021, 3:29 PM

## 2021-04-25 NOTE — Care Management Important Message (Signed)
Important Message  Patient Details  Name: Stephen [name] MRN: [id number] Date of Birth: [birth year]   Medicare Important Message Given:  Yes     Dannette Barbara 04/25/2021, 4:11 PM

## 2021-04-25 NOTE — Evaluation (Addendum)
Occupational Therapy Evaluation Patient Details Name: Stephen [name] MRN: [id number] DOB: 08/31/1945 Today's Date: 04/25/2021   History of Present Illness 75 y.o. male with a hx of  pulmonary hypertension, chronic diastolic CHF, CAD s/p L/R heart cath 2018, R heart cath 6/22,, pulmonary fibrosis s/p wedge resection, obesity, COPD on home oxygen, OSA on CPAP, DM2, CKD, renal artery stenosis, and who is being seen today for the evaluation of chest pain and elevated troponin.   Clinical Impression   Pt seen for OT re-eval and PT co-tx this date due to need for increased assist. Pt noted with decline over the weekend since initial evaluation last week. Pt endorsing significant L wrist pain today 2/2 blood gas procedure. Tolerates PROM with ulnar/radial deviation but unable to tolerate PROM wrist flex/ext. Pt required +2 MOD-MAX A for bed mobility. Poor sitting balance initially improving to fair static balance. Dynamic balance challenge with RUE reaching. PROM and AAROM to RUE with wrist supported. Pt endorsed significant fatigue and back pain after ~61min sitting and requesting to return to bed. Declined standing/OOB attempts despite encouragement. Given increased assist required for all mobility and ADL, OT updating discharge recommendations to SNF for additional therapy in order to maximize safe discharge home. OT goals updated to reflect recent functional decline. Spouse present and informed of change in function resulting in need to update recommendations. Spouse adamantly refusing SNF 2/2 past negative experiences with other family members. If she remains firm in refusal he will require EMS transport home, wheelchair, hospital bed, 3-in-1 commode and a lift if he does not significantly improve along with +2 assist for mobility and basic care.  Pt and wife both encouraged to keep and open mind as home will be challenging.     Recommendations for follow up therapy are one component of a  multi-disciplinary discharge planning process, led by the attending physician.  Recommendations may be updated based on patient status, additional functional criteria and insurance authorization.   Follow Up Recommendations  Skilled nursing-short term rehab (<3 hours/day)    Assistance Recommended at Discharge Frequent or constant Supervision/Assistance  Functional Status Assessment  Patient has had a recent decline in their functional status and demonstrates the ability to make significant improvements in function in a reasonable and predictable amount of time.  Equipment Recommendations  BSC    Recommendations for Other Services       Precautions / Restrictions Precautions Precautions: Sternal Restrictions Weight Bearing Restrictions: No Other Position/Activity Restrictions: L UE wrist/arm sore      Mobility Bed Mobility Overal bed mobility: Needs Assistance Bed Mobility: Supine to Sit;Sit to Supine     Supine to sit: Mod assist;+2 for physical assistance Sit to supine: Max assist;+2 for physical assistance   General bed mobility comments: VC for RUE + bed rail use    Transfers                   General transfer comment: declined      Balance Overall balance assessment: Needs assistance Sitting-balance support: No upper extremity supported;Feet supported Sitting balance-Leahy Scale: Fair Sitting balance - Comments: initial assist and VC to maintain static sitting balance, improving with time and dynamic sitting balance challenge                                   ADL either performed or assessed with clinical judgement   ADL Overall ADL's : Needs assistance/impaired  General ADL Comments: Pt currently requires MAX A for LB ADL, Min-Mod A for UB ADL, and +2 assist for ADL mobility attempts. This is a significant change since initial evaluation.     Vision         Perception      Praxis      Pertinent Vitals/Pain Pain Assessment: 0-10 Pain Score: 8  Faces Pain Scale: Hurts even more Facial Expression: Grimacing Pain Location: L wrist from blood gas procedure Pain Descriptors / Indicators: Sore;Guarding Pain Intervention(s): Limited activity within patient's tolerance;Monitored during session;Premedicated before session;Repositioned     Hand Dominance Left (both)   Extremity/Trunk Assessment Upper Extremity Assessment Upper Extremity Assessment: Generalized weakness;LUE deficits/detail;RUE deficits/detail RUE Deficits / Details: R hand edematous, RN/MD notified LUE Deficits / Details: L wrist painful since blood gas procedure LUE: Unable to fully assess due to pain   Lower Extremity Assessment Lower Extremity Assessment: Generalized weakness (BLE edema)       Communication Communication Communication: No difficulties   Cognition Arousal/Alertness: Awake/alert Behavior During Therapy: WFL for tasks assessed/performed Overall Cognitive Status: Within Functional Limits for tasks assessed                                       General Comments       Exercises Other Exercises Other Exercises: Pt instructed in dynamic reaching balance with pt demonstrating difficulty, in part due to body habitus, only attempted RUE reaching 2/2 L wrist pain Other Exercises: AAROM RUE elbow flex/ext and shoulder flex, wrist supported x5   Shoulder Instructions      Home Living Family/patient expects to be discharged to:: Private residence Living Arrangements: Spouse/significant other Available Help at Discharge: Family;Available 24 hours/day Type of Home: House Home Access: Ramped entrance     Home Layout: One level     Bathroom Shower/Tub: Teacher, early years/pre: Standard     Home Equipment: None          Prior Functioning/Environment                          OT Problem List: Decreased strength;Decreased  activity tolerance;Impaired balance (sitting and/or standing);Pain;Decreased knowledge of use of DME or AE;Obesity      OT Treatment/Interventions: Self-care/ADL training;Therapeutic exercise;Therapeutic activities;Energy conservation;Patient/family education;Balance training;Manual therapy;DME and/or AE instruction    OT Goals(Current goals can be found in the care plan section) Acute Rehab OT Goals Patient Stated Goal: go home OT Goal Formulation: With patient/family Time For Goal Achievement: 05/09/21 Potential to Achieve Goals: Good  OT Frequency: Min 2X/week   Barriers to D/C:            Co-evaluation PT/OT/SLP Co-Evaluation/Treatment: Yes Reason for Co-Treatment: Complexity of the patient's impairments (multi-system involvement);For patient/therapist safety;To address functional/ADL transfers PT goals addressed during session: Mobility/safety with mobility OT goals addressed during session: ADL's and self-care;Strengthening/ROM      AM-PAC OT "6 Clicks" Daily Activity     Outcome Measure Help from another person eating meals?: A Little Help from another person taking care of personal grooming?: A Little Help from another person toileting, which includes using toliet, bedpan, or urinal?: Total Help from another person bathing (including washing, rinsing, drying)?: A Lot Help from another person to put on and taking off regular upper body clothing?: A Lot Help from another person to put on and taking off regular lower  body clothing?: A Lot 6 Click Score: 13   End of Session Equipment Utilized During Treatment: Oxygen (4L O2) Nurse Communication: Other (comment) (R hand edema)  Activity Tolerance: Patient limited by pain Patient left: in bed;with call bell/phone within reach;with bed alarm set;with family/visitor present  OT Visit Diagnosis: Other abnormalities of gait and mobility (R26.89);Muscle weakness (generalized) (M62.81);Pain Pain - Right/Left: Left Pain - part of  body: Hand                Time: 8333-8329 OT Time Calculation (min): 30 min Charges:  OT General Charges $OT Visit: 1 Visit OT Evaluation $OT Re-eval: 1 Re-eval OT Treatments $Therapeutic Activity: 8-22 mins  Ardeth Perfect., MPH, MS, OTR/L ascom [phone] 04/25/21, 1:18 PM

## 2021-04-25 NOTE — Progress Notes (Signed)
Physical Therapy Treatment Patient Details Name: Stephen [name] MRN: [id number] DOB: 1945-12-31 Today's Date: 04/25/2021   History of Present Illness 75 y.o. male with a hx of  pulmonary hypertension, chronic diastolic CHF, CAD s/p L/R heart cath 2018, R heart cath 6/22,, pulmonary fibrosis s/p wedge resection, obesity, COPD on home oxygen, OSA on CPAP, DM2, CKD, renal artery stenosis, and who is being seen today for the evaluation of chest pain and elevated troponin.    PT Comments    Co-tx with OT for session due to need for increased assist.  Pt agrees to EOB and is transitioned with mod a x 2 to sitting.  He is able to remain sitting with min guard +2 for about 5 minutes while doing LUE ROM as he remains very guarded and with limited use since procedure with Cardiology.  He reports increasing back pain in sitting and when encouraged to try standing he declines asking to return to supine.  Assisted with max a x 2.  Discussed at length with wife regarding discharge recs.  He has had a decline since evaluation at admission and at this time needs significant assist for limited mobility.  She is quite resistant to SNF and does not want to consider it at this time.  Explained risks/benefits.  If she remains firm in refusal he will require EMS transport home, wheelchair, hospital bed, 3-in-1 commode and a lift if he does not significantly improve along with +2 assist for mobility and basic care.  Pt and wife both encouraged to keep and open mind as home will be challenging.   Recommendations for follow up therapy are one component of a multi-disciplinary discharge planning process, led by the attending physician.  Recommendations may be updated based on patient status, additional functional criteria and insurance authorization.  Follow Up Recommendations  Skilled nursing-short term rehab (<3 hours/day)     Assistance Recommended at Discharge    Equipment Recommendations  Rolling walker (2  wheels);3in1 (PT);Wheelchair (measurements PT);Hospital bed    Recommendations for Other Services       Precautions / Restrictions Precautions Precautions: Sternal Restrictions Weight Bearing Restrictions: No Other Position/Activity Restrictions: L UE wrist/arm sore     Mobility  Bed Mobility Overal bed mobility: Needs Assistance Bed Mobility: Supine to Sit;Sit to Supine     Supine to sit: Mod assist;+2 for physical assistance Sit to supine: Max assist;+2 for physical assistance        Transfers                   General transfer comment: declined    Ambulation/Gait                 Stairs             Wheelchair Mobility    Modified Rankin (Stroke Patients Only)       Balance Overall balance assessment: Needs assistance Sitting-balance support: No upper extremity supported;Feet supported Sitting balance-Leahy Scale: Fair                                      Buyer, retail  General Comments        Pertinent Vitals/Pain Pain Assessment: Faces Faces Pain Scale: Hurts even more Facial Expression: Grimacing Pain Location: L wrist from blood gas proceedure Pain Descriptors / Indicators: Sore;Guarding Pain Intervention(s): Limited activity within patient's tolerance;Monitored during session;Repositioned    Home Living                          Prior Function            PT Goals (current goals can now be found in the care plan section) Progress towards PT goals: Not progressing toward goals - comment    Frequency    Min 2X/week      PT Plan Discharge plan needs to be updated    Co-evaluation PT/OT/SLP Co-Evaluation/Treatment: Yes Reason for Co-Treatment: Complexity of the patient's impairments (multi-system involvement)   OT goals addressed during session: Strengthening/ROM      AM-PAC PT "6 Clicks" Mobility    Outcome Measure  Help needed turning from your back to your side while in a flat bed without using bedrails?: A Lot Help needed moving from lying on your back to sitting on the side of a flat bed without using bedrails?: A Lot Help needed moving to and from a bed to a chair (including a wheelchair)?: Total Help needed standing up from a chair using your arms (e.g., wheelchair or bedside chair)?: A Lot Help needed to walk in hospital room?: Total Help needed climbing 3-5 steps with a railing? : Total 6 Click Score: 9    End of Session Equipment Utilized During Treatment: Oxygen Activity Tolerance: Patient limited by pain;Patient limited by fatigue Patient left: in bed;with call bell/phone within reach;with bed alarm set;with family/visitor present Nurse Communication: Mobility status PT Visit Diagnosis: Unsteadiness on feet (R26.81);Other abnormalities of gait and mobility (R26.89);Muscle weakness (generalized) (M62.81);Difficulty in walking, not elsewhere classified (R26.2)     Time: 5366-4403 PT Time Calculation (min) (ACUTE ONLY): 29 min  Charges:  $Therapeutic Activity: 8-22 mins                    Chesley Noon, PTA 04/25/21, 12:57 PM

## 2021-04-25 NOTE — Progress Notes (Signed)
Inpatient Diabetes Program Recommendations  AACE/ADA: New Consensus Statement on Inpatient Glycemic Control   Target Ranges:  Prepandial:   less than 140 mg/dL      Peak postprandial:   less than 180 mg/dL (1-2 hours)      Critically ill patients:  140 - 180 mg/dL   Results for Stephen [name], Stephen [name] (MRN [id number]) as of 04/25/2021 09:38  Ref. Range 04/24/2021 08:11 04/24/2021 11:37 04/24/2021 16:38 04/24/2021 21:49 04/25/2021 07:57  Glucose-Capillary Latest Ref Range: 70 - 99 mg/dL 261 (H) 367 (H) 326 (H) 291 (H) 222 (H)   Review of Glycemic Control  Diabetes history: DM2 Outpatient Diabetes medications: 70/30 60 units QAM, 40 units QPM Current orders for Inpatient glycemic control: Semglee 35 units QHS, Novolog 8 units TID with meals, Novolog 0-15 units TID with meals, Novolog 0-5 units QHS  Inpatient Diabetes Program Recommendations:    Insulin: Please consider increasing Semglee to 45 units QHS.  Thanks, Barnie Alderman, RN, MSN, CDE Diabetes Coordinator Inpatient Diabetes Program [phone] (Team Pager from 8am to 5pm)

## 2021-04-25 NOTE — Progress Notes (Signed)
Progress Note  Patient Name: Stephen [name] Date of Encounter: 04/25/2021  [facility] Cardiologist: Ida Rogue, MD   Subjective   UOP -1.2L. Kidney function mildly improved. Patient reports some worsening shortness of breath overnight. No chest pain.   Inpatient Medications    Scheduled Meds:  aspirin EC  81 mg Oral Daily   atorvastatin  80 mg Oral QHS   calcitRIOL  0.25 mcg Oral Daily   carvedilol  18.75 mg Oral BID WC   cloNIDine  0.3 mg Oral TID   clopidogrel  75 mg Oral Daily   dorzolamide-timolol  1 drop Both Eyes BID   enoxaparin (LOVENOX) injection  30 mg Subcutaneous Q24H   ezetimibe  10 mg Oral Daily   fluconazole  100 mg Oral Daily   fluticasone furoate-vilanterol  1 puff Inhalation Daily   gabapentin  100 mg Oral BID   hydrALAZINE  100 mg Oral TID   insulin aspart  0-15 Units Subcutaneous TID WC   insulin aspart  0-5 Units Subcutaneous QHS   insulin aspart  8 Units Subcutaneous TID WC   insulin glargine-yfgn  35 Units Subcutaneous QHS   ipratropium-albuterol  3 mL Nebulization Q6H   isosorbide mononitrate  120 mg Oral Daily   levothyroxine  25 mcg Oral Weekly   levothyroxine  50 mcg Oral Once per day on Mon Tue Wed Thu Fri Sat   montelukast  10 mg Oral Daily   multivitamin with minerals  1 tablet Oral Daily   pantoprazole  40 mg Oral Daily   Ensure Max Protein  11 oz Oral BID   sodium chloride flush  3 mL Intravenous Q12H   tamsulosin  0.4 mg Oral QHS   Continuous Infusions:  sodium chloride     cefTRIAXone (ROCEPHIN)  IV 1 g (04/24/21 2229)   PRN Meds: sodium chloride, acetaminophen, albuterol, docusate sodium, fluticasone, guaiFENesin-dextromethorphan, hydrALAZINE, HYDROcodone-acetaminophen, nitroGLYCERIN, ondansetron (ZOFRAN) IV, sodium chloride, sodium chloride flush   Vital Signs    Vitals:   04/24/21 2325 04/25/21 0110 04/25/21 0432 04/25/21 0433  BP: (!) 110/36  137/62   Pulse: 64  60   Resp: 18  18   Temp: (!) 101.1 F (38.4 C)   98.3 F (36.8 C)   TempSrc:      SpO2: 94% 94% 97%   Weight:    123.7 kg  Height:        Intake/Output Summary (Last 24 hours) at 04/25/2021 0721 Last data filed at 04/25/2021 0433 Gross per 24 hour  Intake --  Output 1275 ml  Net -1275 ml   Last 3 Weights 04/25/2021 04/24/2021 04/23/2021  Weight (lbs) 272 lb 11.3 oz 271 lb 13.2 oz 267 lb 10.2 oz  Weight (kg) 123.7 kg 123.3 kg 121.4 kg  Some encounter information is confidential and restricted. Go to Review Flowsheets activity to see all data.      Telemetry    NSR, HR 50-60s - Personally Reviewed  ECG    NO new - Personally Reviewed  Physical Exam   GEN: No acute distress.   Neck: difficult to assess JVD Cardiac: RRR, no murmurs, rubs, or gallops.  Respiratory: diminished. GI: Soft, nontender, non-distended  MS: 1+ lower leg edema; No deformity. Neuro:  Nonfocal  Psych: Normal affect   Labs    High Sensitivity Troponin:   Recent Labs  Lab 04/05/21 2016 04/17/21 2101 04/18/21 0505 04/18/21 0658 04/18/21 1240  TROPONINIHS 107* 74* 2,129* 1,997* 1,842*     Chemistry  Recent Labs  Lab 04/22/21 2058 04/23/21 0346 04/24/21 0406  NA 136 138 137  K 4.6 4.3 4.2  CL 99 100 103  CO2 28 29 28   GLUCOSE 293* 262* 315*  BUN 78* 81* 84*  CREATININE 3.27* 3.37* 3.16*  CALCIUM 8.5* 8.6* 8.3*  MG 2.2  --   --   PROT 6.1*  --   --   ALBUMIN 2.5*  --   --   AST 20  --   --   ALT 26  --   --   ALKPHOS 51  --   --   BILITOT 0.6  --   --   GFRNONAA 19* 18* 20*  ANIONGAP 9 9 6     Lipids No results for input(s): CHOL, TRIG, HDL, LABVLDL, LDLCALC, CHOLHDL in the last 168 hours.  Hematology Recent Labs  Lab 04/22/21 2058 04/23/21 0346 04/24/21 0406  WBC 14.8* 13.5* 13.3*  RBC 3.53* 3.47* 3.59*  HGB 9.9* 9.8* 10.1*  HCT 30.9* 31.1* 31.3*  MCV 87.5 89.6 87.2  MCH 28.0 28.2 28.1  MCHC 32.0 31.5 32.3  RDW 15.4 15.6* 15.5  PLT 151 160 144*   Thyroid  Recent Labs  Lab 04/23/21 0346  TSH 1.361  FREET4  0.82    BNP Recent Labs  Lab 04/19/21 0359  BNP 680.2*    DDimer No results for input(s): DDIMER in the last 168 hours.   Radiology    No results found.  Cardiac Studies   Echo 04/2021   1. Left ventricular ejection fraction, by estimation, is 60 to 65%. The  left ventricle has normal function. Left ventricular endocardial border  not optimally defined to evaluate regional wall motion. The left  ventricular internal cavity size was mildly  dilated. There is mild left ventricular hypertrophy. Left ventricular  diastolic parameters are consistent with Grade I diastolic dysfunction  (impaired relaxation). Elevated left atrial pressure.   2. Right ventricular systolic function is normal. The right ventricular  size is not well visualized.   3. The mitral valve is abnormal. Trivial mitral valve regurgitation. Mild  mitral stenosis. The mean mitral valve gradient is 5.0 mmHg.   4. Tricuspid valve regurgitation not well assessed.   5. The aortic valve has an indeterminant number of cusps. There is mild  calcification of the aortic valve. There is moderate thickening of the  aortic valve. Aortic valve regurgitation is trivial. Mild to moderate  aortic valve sclerosis/calcification is  present, without any evidence of aortic stenosis.   Right Cardiac cath 12/2020 Successful right heart catheterization via the right brachial vein. Right heart catheterization showed mildly to moderately elevated filling pressures, mild pulmonary hypertension and high cardiac output.   RA: 13 mmHg PCW: 20 mmHg with a V wave of 27 mmHg PA: 44/ 18 with a mean of 30 mmHg.  Pulmonary vascular resistance is only 1.14 Woods unit RV: 48 /12 mmHg PA sat is 97% with cardiac output of 8.79 and cardiac index of 3.98.   Recommendations: Recommend increasing torsemide to 40 mg once daily.  Check basic metabolic profile in 1 week.  Patient Profile     75 y.o. male  with a hx of  pulmonary hypertension,  chronic diastolic CHF, CAD, pulmonary fibrosis s/p wedge resection, obesity, COPD on home oxygen, OSA on CPAP, DM2, CKD, renal artery stenosis, and who is being seen today for the evaluation of chest pain and elevated HS Tn.   Assessment & Plan    HFpEF/pulmonary arterial HTN -  admitted with chest tightness and dyspnea found to be volume overloaded - Echo showed normal LVSF and G1DD - Was on IV lasix>>held for worsening creatinine - PTA lasix 160mg daily - still volume overloaded on exam - once kidney function is stable consider lasix 160mg  in the AM and 40 in the pm. Will discuss dose with MD  CAD/NSTEMI - admitted with chest pain and dyspnea - HS trop peak 2129 - cath in 08/2020 showed moderate LAD and RCA disease and occluded Lcx with R>L and L>L collaterals.  - Poor candidate for re-cath due to CKD - Patient denies chest pain - Plan for medication management with ASA, Plavix, BB, statin, and nitrate  HTN - continue clonidine, hydralazine, Imdur, Coreg - BPs reasonable  CKD stage IV - diuretics on hold - Scr 3.16, BUN 84  ILD/pulmonary fibrosis - steroids and nebulizers per IM -off Bipap  OSA -continue CPAP  HLD - LDL 69 in June 2022  For questions or updates, please contact [facility] Please consult [email or web address] for contact info under        Signed, Manasvi Dickard Ninfa Meeker, PA-C  04/25/2021, 7:21 AM

## 2021-04-26 ENCOUNTER — Encounter: Payer: Self-pay | Admitting: Internal Medicine

## 2021-04-26 ENCOUNTER — Inpatient Hospital Stay: Payer: Medicare HMO

## 2021-04-26 DIAGNOSIS — N390 Urinary tract infection, site not specified: Secondary | ICD-10-CM | POA: Diagnosis not present

## 2021-04-26 DIAGNOSIS — I214 Non-ST elevation (NSTEMI) myocardial infarction: Secondary | ICD-10-CM | POA: Diagnosis not present

## 2021-04-26 DIAGNOSIS — I5033 Acute on chronic diastolic (congestive) heart failure: Secondary | ICD-10-CM | POA: Diagnosis not present

## 2021-04-26 LAB — CBC
HCT: 30.4 % — ABNORMAL LOW (ref 39.0–52.0)
Hemoglobin: 9.7 g/dL — ABNORMAL LOW (ref 13.0–17.0)
MCH: 27.8 pg (ref 26.0–34.0)
MCHC: 31.9 g/dL (ref 30.0–36.0)
MCV: 87.1 fL (ref 80.0–100.0)
Platelets: 184 10*3/uL (ref 150–400)
RBC: 3.49 MIL/uL — ABNORMAL LOW (ref 4.22–5.81)
RDW: 15.8 % — ABNORMAL HIGH (ref 11.5–15.5)
WBC: 10.3 10*3/uL (ref 4.0–10.5)
nRBC: 0 % (ref 0.0–0.2)

## 2021-04-26 LAB — BASIC METABOLIC PANEL
Anion gap: 9 (ref 5–15)
BUN: 80 mg/dL — ABNORMAL HIGH (ref 8–23)
CO2: 25 mmol/L (ref 22–32)
Calcium: 8.1 mg/dL — ABNORMAL LOW (ref 8.9–10.3)
Chloride: 98 mmol/L (ref 98–111)
Creatinine, Ser: 3.07 mg/dL — ABNORMAL HIGH (ref 0.61–1.24)
GFR, Estimated: 21 mL/min — ABNORMAL LOW (ref >60)
Glucose, Bld: 159 mg/dL — ABNORMAL HIGH (ref 70–99)
Potassium: 4.4 mmol/L (ref 3.5–5.1)
Sodium: 132 mmol/L — ABNORMAL LOW (ref 135–145)

## 2021-04-26 LAB — GLUCOSE, CAPILLARY
Glucose-Capillary: 156 mg/dL — ABNORMAL HIGH (ref 70–99)
Glucose-Capillary: 203 mg/dL — ABNORMAL HIGH (ref 70–99)
Glucose-Capillary: 166 mg/dL — ABNORMAL HIGH (ref 70–99)
Glucose-Capillary: 154 mg/dL — ABNORMAL HIGH (ref 70–99)

## 2021-04-26 MED ORDER — BISACODYL 10 MG RE SUPP
10.0000 mg | Freq: Once | RECTAL | Status: AC
  Administered 2021-04-26: 10 mg via RECTAL
  Filled 2021-04-26: qty 1

## 2021-04-26 MED ORDER — MORPHINE SULFATE (PF) 2 MG/ML IV SOLN
1.0000 mg | INTRAVENOUS | Status: DC | PRN
  Administered 2021-04-26 – 2021-04-28 (×9): 1 mg via INTRAVENOUS
  Filled 2021-04-26 (×9): qty 1

## 2021-04-26 MED ORDER — ENOXAPARIN SODIUM 100 MG/ML IJ SOSY
90.0000 mg | PREFILLED_SYRINGE | Freq: Once | INTRAMUSCULAR | Status: AC
  Administered 2021-04-26: 90 mg via SUBCUTANEOUS
  Filled 2021-04-26: qty 0.9

## 2021-04-26 MED ORDER — POLYETHYLENE GLYCOL 3350 17 G PO PACK
17.0000 g | PACK | Freq: Every day | ORAL | Status: DC
  Administered 2021-04-26 – 2021-04-30 (×4): 17 g via ORAL
  Filled 2021-04-26 (×4): qty 1

## 2021-04-26 NOTE — Progress Notes (Signed)
PROGRESS NOTE   HPI was taken from Dr. [name]: Stephen [name] is a 75 y.o. male with medical history significant for Moderate persistent asthma, interstitial lung disease, pulmonary hypertension, OSA with chronic respiratory failure on home O2 at 4 L, followed by pulmonology, as well as diastolic heart failure, CAD, DM, CKD IV, who was brought in by EMS in respiratory distress.  Reports a one week history of progressively worsening shortness of breath not improving after increasing home oxygen to 6-1/2 L from his baseline of 6L.  He has mild productive cough with yellowish-white phlegm but denies fever or chills.  He denies chest pain, nausea vomiting or abdominal pain and denies diarrhea.  He has mild lower extremity edema but without pain. Wife contributes to history as patient is on BiPAP   ED course: On arrival afebrile, BP 155/107 going as high as 204/75 with pulse in the 50s to 60s.  O2 sats 100% on 6 L but with increased work of breathing requiring transition to BiPAP Blood work troponin 74 with BNP 250.  WBC 6 with lactic acid 1.2.  Creatinine 2.66 which is about his baseline for CKD 4.   EKG, personally viewed and interpreted: Sinus rhythm at 62 with no acute ST-T wave changes   Imaging: Chest x-ray.  Persistent airspace opacities stable from prior exam on 10/4   Patient treated with duo nebs and Solu-Medrol as well as IV Lasix and given on oral clonidine.  Hospitalist consulted for admission.     Hospital course from Dr. Jimmye Norman 10/19-10/25/22: Pt was found to have NSTEMI and was treated medically as pt was unable to get a cath secondary to CKD & unable to lay flat as per cardio. Also, pt has acute on chronic hypoxic & hypercapnic respiratory secondary to COPD, CHF, w/ hx of chronic lung conditions (ILD, PAH). Pt is intermittently on BiPAP as a result. Furthermore, pt was found to have UTI w/ 2 bacteria (see cxs) and is on IV rocephin as per cx results. Pt has spiked fevers over the  past 48 hours. Blood cxs are NGTD and repeat CXR does not show  any pneumonia. May need to consult ID if pt continues to spike fevers. Also, PT/OT re-evaluated the pt and recommends SNF. Pt's wife has so far refused SNF and wants to take care of the pt at home.       Stephen [name]  PPI:[id number] DOB: 1946/04/28 DOA: 04/17/2021 PCP: [name], MD    Assessment & Plan:   Active Problems:   Type 2 diabetes mellitus with renal complication (HCC)   OSA (obstructive sleep apnea)   CAD (coronary artery disease)   Essential hypertension, benign   Acute on chronic heart failure with preserved ejection fraction (HFpEF) (HCC)   Pulmonary hypertension, primary (HCC)   Acute on chronic respiratory failure with hypoxemia (HCC)   CKD (chronic kidney disease) stage 4, GFR 15-29 ml/min (HCC)   ILD (interstitial lung disease) (HCC)   COPD with acute exacerbation ([facility])   NSTEMI: w/ hx of CAD. EF 06-30%, grade I diastolic dysfunction. Continue on coreg, imdur, plavix, statin & zetia. D/c IV heparin. No cardiac cath secondary to CKD as per cardio    Acute on chronic hypoxic & hypercapnic respiratory failure: presenting in acute respiratory distress, speaking in short sentences, requiring BiPAP. Intermittently requires BiPAP. Etiology likely secondary to COPD,  acute on chronic diastolic CHF exacerbation superimposed on chronic pulmon conditions, ILD, PAH. Completed steroid course. Lasix has been on  hold for AKI on CKD but Cr is slowly trending down today. Hopefully can restart soon. Continue on bronchodilators and encourage incentive spirometry   Encephalopathy: etiology unclear, possibly metabolic vs retention of CO2 vs UTI. CT head neg for any acute intracranial findings. Weaned off of BiPAP. Mental status is labile   UTI: urine cx is growing e.coli & s. marcescens. Continue on IV rocephin. Continues to spike fevers over the past 48 hours. Blood cxs NGTD. No PNA on repeat CXR. Consider ID consult  if pt continues to spike fevers. WBC is WNL.    Acute on chronic diastolic CHF: continue to hold lasix today. Cr is slowly trending down. Hopefully can restart lasix again soon. Monitor I/Os and daily weights. Cardio recs apprec    HTN emergency: resolved but still w/ HTN. Continue on imdur, coreg, hydralazine   COPD exacerbation: completed steroid course. Continue on bronchodilators and incentive spirometry     Pulmon disease: w/ hx of ILD, pulmonary HTN. Continue to hold sildanefil   AKI on CKDIV: baseline Cr 2.6. Cr is trending down today   DM2:likely poorly controlled. Continue on glargine, SSI w/ accuchecks   Thrombocytopenia: resolved  OSA: uses CPAP qhs at home   Morbid obesity: BMI 45.1. Complicates overall care & prognosis   DVT prophylaxis: heparin  Code Status: full  Family Communication: discussed pt's care w/ pt's wife at bedside and answered her questions  Disposition Plan: likely d/c back home as pt and pt's wife are refusing SNF  Level of care: Progressive Cardiac  Status is: Inpatient  Remains inpatient appropriate because: secondary respiratory status, severity of illness & spiking fevers still      Consultants:  Cardio   Procedures:   Antimicrobials: rocephin    Subjective: Pt c/o malaise & diffuse body pains    Objective: Vitals:   04/25/21 2051 04/26/21 0214 04/26/21 0311 04/26/21 0643  BP:   (!) 122/51   Pulse:   64   Resp:   16   Temp:  (!) 101 F (38.3 C) 99.1 F (37.3 C)   TempSrc:   Oral   SpO2: 95%  95%   Weight:    123.1 kg  Height:        Intake/Output Summary (Last 24 hours) at 04/26/2021 0752 Last data filed at 04/25/2021 1825 Gross per 24 hour  Intake 720 ml  Output 1400 ml  Net -680 ml   Filed Weights   04/24/21 0500 04/25/21 0433 04/26/21 0643  Weight: 123.3 kg 123.7 kg 123.1 kg    Examination:  General exam: Appears calm & comfortable. Morbidly obese Respiratory system: decreased breath sounds b/l   Cardiovascular system: S1/S2+. No rubs or clicks  Gastrointestinal system: Abd is soft, NT, obese & hypoactive bowel sounds Central nervous system: Moves all extremities  Psychiatry:judgement and insight appears poor. Flat mood and affect    Data Reviewed: I have personally reviewed following labs and imaging studies  CBC: Recent Labs  Lab 04/22/21 2058 04/23/21 0346 04/24/21 0406 04/25/21 0741 04/26/21 0436  WBC 14.8* 13.5* 13.3* 10.3 10.3  NEUTROABS 13.7*  --   --   --   --   HGB 9.9* 9.8* 10.1* 10.4* 9.7*  HCT 30.9* 31.1* 31.3* 32.5* 30.4*  MCV 87.5 89.6 87.2 87.8 87.1  PLT 151 160 144* 174 381   Basic Metabolic Panel: Recent Labs  Lab 04/22/21 2058 04/23/21 0346 04/24/21 0406 04/25/21 0741 04/26/21 0436  NA 136 138 137 136 132*  K 4.6 4.3  4.2 4.4 4.4  CL 99 100 103 99 98  CO2 28 29 28 26 25   GLUCOSE 293* 262* 315* 232* 159*  BUN 78* 81* 84* 89* 80*  CREATININE 3.27* 3.37* 3.16* 3.17* 3.07*  CALCIUM 8.5* 8.6* 8.3* 8.3* 8.1*  MG 2.2  --   --   --   --    GFR: Estimated Creatinine Clearance: 26.1 mL/min (A) (by C-G formula based on SCr of 3.07 mg/dL (H)). Liver Function Tests: Recent Labs  Lab 04/22/21 2058  AST 20  ALT 26  ALKPHOS 51  BILITOT 0.6  PROT 6.1*  ALBUMIN 2.5*   No results for input(s): LIPASE, AMYLASE in the last 168 hours. Recent Labs  Lab 04/22/21 2058  AMMONIA 22   Coagulation Profile: No results for input(s): INR, PROTIME in the last 168 hours.  Cardiac Enzymes: No results for input(s): CKTOTAL, CKMB, CKMBINDEX, TROPONINI in the last 168 hours. BNP (last 3 results) No results for input(s): PROBNP in the last 8760 hours. HbA1C: No results for input(s): HGBA1C in the last 72 hours. CBG: Recent Labs  Lab 04/24/21 2149 04/25/21 0757 04/25/21 1104 04/25/21 1722 04/25/21 2049  GLUCAP 291* 222* 271* 225* 229*   Lipid Profile: No results for input(s): CHOL, HDL, LDLCALC, TRIG, CHOLHDL, LDLDIRECT in the last 72  hours. Thyroid Function Tests: No results for input(s): TSH, T4TOTAL, FREET4, T3FREE, THYROIDAB in the last 72 hours.  Anemia Panel: No results for input(s): VITAMINB12, FOLATE, FERRITIN, TIBC, IRON, RETICCTPCT in the last 72 hours.  Sepsis Labs: Recent Labs  Lab 04/22/21 1634  PROCALCITON 0.22    Recent Results (from the past 240 hour(s))  Resp Panel by RT-PCR (Flu A&B, Covid) Nasopharyngeal Swab     Status: None   Collection Time: 04/17/21  8:58 PM   Specimen: Nasopharyngeal Swab; Nasopharyngeal(NP) swabs in vial transport medium  Result Value Ref Range Status   SARS Coronavirus 2 by RT PCR NEGATIVE NEGATIVE Final    Comment: (NOTE) SARS-CoV-2 target nucleic acids are NOT DETECTED.  The SARS-CoV-2 RNA is generally detectable in upper respiratory specimens during the acute phase of infection. The lowest concentration of SARS-CoV-2 viral copies this assay can detect is 138 copies/mL. A negative result does not preclude SARS-Cov-2 infection and should not be used as the sole basis for treatment or other patient management decisions. A negative result may occur with  improper specimen collection/handling, submission of specimen other than nasopharyngeal swab, presence of viral mutation(s) within the areas targeted by this assay, and inadequate number of viral copies(<138 copies/mL). A negative result must be combined with clinical observations, patient history, and epidemiological information. The expected result is Negative.  Fact Sheet for Patients:  EntrepreneurPulse.com.au  Fact Sheet for Healthcare Providers:  IncredibleEmployment.be  This test is no t yet approved or cleared by the [place] FDA and  has been authorized for detection and/or diagnosis of SARS-CoV-2 by FDA under an Emergency Use Authorization (EUA). This EUA will remain  in effect (meaning this test can be used) for the duration of the COVID-19 declaration under  Section 564(b)(1) of the Act, 21 U.S.C.section 360bbb-3(b)(1), unless the authorization is terminated  or revoked sooner.       Influenza A by PCR NEGATIVE NEGATIVE Final   Influenza B by PCR NEGATIVE NEGATIVE Final    Comment: (NOTE) The Xpert Xpress SARS-CoV-2/FLU/RSV plus assay is intended as an aid in the diagnosis of influenza from Nasopharyngeal swab specimens and should not be used as a sole basis  for treatment. Nasal washings and aspirates are unacceptable for Xpert Xpress SARS-CoV-2/FLU/RSV testing.  Fact Sheet for Patients: EntrepreneurPulse.com.au  Fact Sheet for Healthcare Providers: IncredibleEmployment.be  This test is not yet approved or cleared by the [place] FDA and has been authorized for detection and/or diagnosis of SARS-CoV-2 by FDA under an Emergency Use Authorization (EUA). This EUA will remain in effect (meaning this test can be used) for the duration of the COVID-19 declaration under Section 564(b)(1) of the Act, 21 U.S.C. section 360bbb-3(b)(1), unless the authorization is terminated or revoked.  Performed at [facility], [address] [place], [place] [address]   Urine Culture     Status: Abnormal   Collection Time: 04/22/21 11:53 AM   Specimen: Urine, Clean Catch  Result Value Ref Range Status   Specimen Description   Final    URINE, CLEAN CATCH Performed at [facility], [address] [place], [place] [address]    Special Requests   Final    NONE Performed at [facility], [place], [place] [address]    Culture (A)  Final    50,000 COLONIES/mL ESCHERICHIA COLI >=100,000 COLONIES/mL SERRATIA MARCESCENS    Report Status 04/25/2021 FINAL  Final   Organism ID, Bacteria ESCHERICHIA COLI (A)  Final   Organism ID, Bacteria SERRATIA MARCESCENS (A)  Final      Susceptibility   Escherichia coli - MIC*    AMPICILLIN 4 SENSITIVE Sensitive     CEFAZOLIN <=4  SENSITIVE Sensitive     CEFEPIME <=0.12 SENSITIVE Sensitive     CEFTRIAXONE <=0.25 SENSITIVE Sensitive     CIPROFLOXACIN <=0.25 SENSITIVE Sensitive     GENTAMICIN <=1 SENSITIVE Sensitive     IMIPENEM <=0.25 SENSITIVE Sensitive     NITROFURANTOIN <=16 SENSITIVE Sensitive     TRIMETH/SULFA <=20 SENSITIVE Sensitive     AMPICILLIN/SULBACTAM <=2 SENSITIVE Sensitive     PIP/TAZO <=4 SENSITIVE Sensitive     * 50,000 COLONIES/mL ESCHERICHIA COLI   Serratia marcescens - MIC*    CEFAZOLIN >=64 RESISTANT Resistant     CEFEPIME <=0.12 SENSITIVE Sensitive     CEFTRIAXONE <=0.25 SENSITIVE Sensitive     CIPROFLOXACIN <=0.25 SENSITIVE Sensitive     GENTAMICIN <=1 SENSITIVE Sensitive     NITROFURANTOIN 128 RESISTANT Resistant     TRIMETH/SULFA <=20 SENSITIVE Sensitive     * >=100,000 COLONIES/mL SERRATIA MARCESCENS  CULTURE, BLOOD (ROUTINE X 2) w Reflex to ID Panel     Status: None (Preliminary result)   Collection Time: 04/25/21  3:33 PM   Specimen: BLOOD  Result Value Ref Range Status   Specimen Description BLOOD BLOOD RIGHT WRIST  Final   Special Requests   Final    BOTTLES DRAWN AEROBIC AND ANAEROBIC Blood Culture adequate volume   Culture   Final    NO GROWTH < 24 HOURS Performed at [facility], [address] [place], [place] [address]    Report Status PENDING  Incomplete  CULTURE, BLOOD (ROUTINE X 2) w Reflex to ID Panel     Status: None (Preliminary result)   Collection Time: 04/25/21  3:38 PM   Specimen: BLOOD  Result Value Ref Range Status   Specimen Description BLOOD BLOOD RIGHT HAND  Final   Special Requests   Final    BOTTLES DRAWN AEROBIC AND ANAEROBIC Blood Culture adequate volume   Culture   Final    NO GROWTH < 24 HOURS Performed at [facility], [place],  [place], [place] [address]    Report Status PENDING  Incomplete         Radiology Studies: No results found.      Scheduled Meds:  aspirin EC  81 mg Oral Daily   atorvastatin   80 mg Oral QHS   calcitRIOL  0.25 mcg Oral Daily   carvedilol  18.75 mg Oral BID WC   cloNIDine  0.3 mg Oral TID   clopidogrel  75 mg Oral Daily   dorzolamide-timolol  1 drop Both Eyes BID   enoxaparin (LOVENOX) injection  30 mg Subcutaneous Q24H   ezetimibe  10 mg Oral Daily   fluconazole  100 mg Oral Daily   fluticasone furoate-vilanterol  1 puff Inhalation Daily   gabapentin  100 mg Oral BID   hydrALAZINE  100 mg Oral TID   insulin aspart  0-15 Units Subcutaneous TID WC   insulin aspart  0-5 Units Subcutaneous QHS   insulin aspart  8 Units Subcutaneous TID WC   insulin glargine-yfgn  45 Units Subcutaneous QHS   ipratropium-albuterol  3 mL Nebulization Q6H   isosorbide mononitrate  120 mg Oral Daily   levothyroxine  25 mcg Oral Weekly   levothyroxine  50 mcg Oral Once per day on Mon Tue Wed Thu Fri Sat   montelukast  10 mg Oral Daily   multivitamin with minerals  1 tablet Oral Daily   pantoprazole  40 mg Oral Daily   Ensure Max Protein  11 oz Oral BID   sodium chloride flush  3 mL Intravenous Q12H   tamsulosin  0.4 mg Oral QHS   Continuous Infusions:  sodium chloride     cefTRIAXone (ROCEPHIN)  IV 1 g (04/25/21 2114)     LOS: 9 days    Time spent: 20 mins     Wyvonnia Dusky, MD [facility] Pager [phone]  If 7PM-7AM, please contact night-coverage 04/26/2021, 7:52 AM

## 2021-04-26 NOTE — Progress Notes (Signed)
Progress Note  Patient Name: Stephen [name] Date of Encounter: 04/26/2021  [facility] Cardiologist: Ida Rogue, MD   Subjective   Creatinine continues to improve.   Inpatient Medications    Scheduled Meds:  aspirin EC  81 mg Oral Daily   atorvastatin  80 mg Oral QHS   calcitRIOL  0.25 mcg Oral Daily   carvedilol  18.75 mg Oral BID WC   cloNIDine  0.3 mg Oral TID   clopidogrel  75 mg Oral Daily   dorzolamide-timolol  1 drop Both Eyes BID   enoxaparin (LOVENOX) injection  30 mg Subcutaneous Q24H   ezetimibe  10 mg Oral Daily   fluconazole  100 mg Oral Daily   fluticasone furoate-vilanterol  1 puff Inhalation Daily   gabapentin  100 mg Oral BID   hydrALAZINE  100 mg Oral TID   insulin aspart  0-15 Units Subcutaneous TID WC   insulin aspart  0-5 Units Subcutaneous QHS   insulin aspart  8 Units Subcutaneous TID WC   insulin glargine-yfgn  45 Units Subcutaneous QHS   ipratropium-albuterol  3 mL Nebulization Q6H   isosorbide mononitrate  120 mg Oral Daily   levothyroxine  25 mcg Oral Weekly   levothyroxine  50 mcg Oral Once per day on Mon Tue Wed Thu Fri Sat   montelukast  10 mg Oral Daily   multivitamin with minerals  1 tablet Oral Daily   pantoprazole  40 mg Oral Daily   Ensure Max Protein  11 oz Oral BID   sodium chloride flush  3 mL Intravenous Q12H   tamsulosin  0.4 mg Oral QHS   Continuous Infusions:  sodium chloride     cefTRIAXone (ROCEPHIN)  IV 1 g (04/25/21 2114)   PRN Meds: sodium chloride, acetaminophen, albuterol, docusate sodium, fluticasone, guaiFENesin-dextromethorphan, hydrALAZINE, HYDROcodone-acetaminophen, morphine injection, nitroGLYCERIN, ondansetron (ZOFRAN) IV, sodium chloride, sodium chloride flush   Vital Signs    Vitals:   04/26/21 0214 04/26/21 0311 04/26/21 0643 04/26/21 0753  BP:  (!) 122/51  (!) 144/63  Pulse:  64  64  Resp:  16  16  Temp: (!) 101 F (38.3 C) 99.1 F (37.3 C)  98 F (36.7 C)  TempSrc:  Oral    SpO2:   95%  96%  Weight:   123.1 kg   Height:        Intake/Output Summary (Last 24 hours) at 04/26/2021 0857 Last data filed at 04/26/2021 7989 Gross per 24 hour  Intake 720 ml  Output 2000 ml  Net -1280 ml   Last 3 Weights 04/26/2021 04/25/2021 04/24/2021  Weight (lbs) 271 lb 6.2 oz 272 lb 11.3 oz 271 lb 13.2 oz  Weight (kg) 123.1 kg 123.7 kg 123.3 kg  Some encounter information is confidential and restricted. Go to Review Flowsheets activity to see all data.      Telemetry    NSR, HR 60s - Personally Reviewed  ECG    No new - Personally Reviewed  Physical Exam   GEN: No acute distress.   Neck: difficult to assess JVD Cardiac: RRR, no murmurs, rubs, or gallops.  Respiratory: mild crackles. GI: Soft, nontender, non-distended  MS: 1+ lower leg edema; No deformity. Neuro:  Nonfocal  Psych: Normal affect   Labs    High Sensitivity Troponin:   Recent Labs  Lab 04/05/21 2016 04/17/21 2101 04/18/21 0505 04/18/21 0658 04/18/21 1240  TROPONINIHS 107* 74* 2,129* 1,997* 1,842*     Chemistry Recent Labs  Lab 04/22/21  4944 04/23/21 0346 04/24/21 0406 04/25/21 0741 04/26/21 0436  NA 136   < > 137 136 132*  K 4.6   < > 4.2 4.4 4.4  CL 99   < > 103 99 98  CO2 28   < > 28 26 25   GLUCOSE 293*   < > 315* 232* 159*  BUN 78*   < > 84* 89* 80*  CREATININE 3.27*   < > 3.16* 3.17* 3.07*  CALCIUM 8.5*   < > 8.3* 8.3* 8.1*  MG 2.2  --   --   --   --   PROT 6.1*  --   --   --   --   ALBUMIN 2.5*  --   --   --   --   AST 20  --   --   --   --   ALT 26  --   --   --   --   ALKPHOS 51  --   --   --   --   BILITOT 0.6  --   --   --   --   GFRNONAA 19*   < > 20* 20* 21*  ANIONGAP 9   < > 6 11 9    < > = values in this interval not displayed.    Lipids No results for input(s): CHOL, TRIG, HDL, LABVLDL, LDLCALC, CHOLHDL in the last 168 hours.  Hematology Recent Labs  Lab 04/24/21 0406 04/25/21 0741 04/26/21 0436  WBC 13.3* 10.3 10.3  RBC 3.59* 3.70* 3.49*  HGB 10.1* 10.4*  9.7*  HCT 31.3* 32.5* 30.4*  MCV 87.2 87.8 87.1  MCH 28.1 28.1 27.8  MCHC 32.3 32.0 31.9  RDW 15.5 15.7* 15.8*  PLT 144* 174 184   Thyroid  Recent Labs  Lab 04/23/21 0346  TSH 1.361  FREET4 0.82    BNPNo results for input(s): BNP, PROBNP in the last 168 hours.  DDimer No results for input(s): DDIMER in the last 168 hours.   Radiology    US Venous Img Upper Uni Right(DVT)  Result Date: 04/26/2021 CLINICAL DATA:  Right upper extremity edema. EXAM: RIGHT UPPER EXTREMITY VENOUS DOPPLER ULTRASOUND TECHNIQUE: Gray-scale sonography with graded compression, as well as color Doppler and duplex ultrasound were performed to evaluate the upper extremity deep venous system from the level of the subclavian vein and including the jugular, axillary, basilic, radial, ulnar and upper cephalic vein. Spectral Doppler was utilized to evaluate flow at rest and with distal augmentation maneuvers. COMPARISON:  None. FINDINGS: Contralateral Subclavian Vein: Respiratory phasicity is normal and symmetric with the symptomatic side. No evidence of thrombus. Normal compressibility. Internal Jugular Vein: No evidence of thrombus. Normal compressibility, respiratory phasicity and response to augmentation. Subclavian Vein: No evidence of thrombus. Normal compressibility, respiratory phasicity and response to augmentation. Axillary Vein: No evidence of thrombus. Normal compressibility, respiratory phasicity and response to augmentation. Cephalic Vein: No evidence of thrombus. Normal compressibility, respiratory phasicity and response to augmentation. Basilic Vein: No evidence of thrombus. Normal compressibility, respiratory phasicity and response to augmentation. Brachial Veins: No evidence of thrombus. Normal compressibility, respiratory phasicity and response to augmentation. Radial Veins: No evidence of thrombus. Normal compressibility, respiratory phasicity and response to augmentation. Ulnar Veins: No evidence of  thrombus. Normal compressibility, respiratory phasicity and response to augmentation. Venous Reflux:  None visualized. Other Findings: No evidence of superficial thrombophlebitis or abnormal fluid collection. IMPRESSION: No evidence of DVT within the right upper extremity. Electronically Signed   By: Eulas Post  Kathlene Cote M.D.   On: 04/26/2021 07:51    Cardiac Studies   Echo 04/2021   1. Left ventricular ejection fraction, by estimation, is 60 to 65%. The  left ventricle has normal function. Left ventricular endocardial border  not optimally defined to evaluate regional wall motion. The left  ventricular internal cavity size was mildly  dilated. There is mild left ventricular hypertrophy. Left ventricular  diastolic parameters are consistent with Grade I diastolic dysfunction  (impaired relaxation). Elevated left atrial pressure.   2. Right ventricular systolic function is normal. The right ventricular  size is not well visualized.   3. The mitral valve is abnormal. Trivial mitral valve regurgitation. Mild  mitral stenosis. The mean mitral valve gradient is 5.0 mmHg.   4. Tricuspid valve regurgitation not well assessed.   5. The aortic valve has an indeterminant number of cusps. There is mild  calcification of the aortic valve. There is moderate thickening of the  aortic valve. Aortic valve regurgitation is trivial. Mild to moderate  aortic valve sclerosis/calcification is  present, without any evidence of aortic stenosis.    Right Cardiac cath 12/2020 Successful right heart catheterization via the right brachial vein. Right heart catheterization showed mildly to moderately elevated filling pressures, mild pulmonary hypertension and high cardiac output.   RA: 13 mmHg PCW: 20 mmHg with a V wave of 27 mmHg PA: 44/ 18 with a mean of 30 mmHg.  Pulmonary vascular resistance is only 1.14 [place] unit RV: 48 /12 mmHg PA sat is 97% with cardiac output of 8.79 and cardiac index of 3.98.    Recommendations: Recommend increasing torsemide to 40 mg once daily.  Check basic metabolic profile in 1 week.  Patient Profile     75 y.o. male with a hx of  pulmonary hypertension, chronic diastolic CHF, CAD, pulmonary fibrosis s/p wedge resection, obesity, COPD on home oxygen, OSA on CPAP, DM2, CKD, renal artery stenosis, and who is being seen today for the evaluation of chest pain and elevated HS Tn.   Assessment & Plan    HFpEF/pulmonary arterial HTN - admitted with chest tightness and dyspnea found to be volume overloaded. BNP 680 on 10/18 - Echo showed normal LVSF and G1DD - Was on IV lasix>>held for worsening creatinine - Creatinine slowly improving - PTA lasix 160mg daily - lower leg edema on exam. Will check a BNP. - once kidney function is stable consider lasix 160mg  in the AM and 40 in the pm.    CAD/NSTEMI - admitted with chest pain and dyspnea - HS trop peak 2129 - cath in 08/2020 showed moderate LAD and RCA disease and occluded Lcx with R>L and L>L collaterals.  - Poor candidate for re-cath due to CKD - Patient denies chest pain - Plan for medication management with ASA, Plavix, BB, statin, and nitrate   HTN - continue clonidine, hydralazine, Imdur, Coreg - BPs reasonable   CKD stage IV - diuretics on hold - Scr 3.16, BUN 84 - baseline creatinine appears to be around ?2.5-2.7   ILD/pulmonary fibrosis - steroids and nebulizers per IM -off Bipap   OSA -continue CPAP   HLD - LDL 69 in June 2022 - continue Zetia and statin  For questions or updates, please contact [facility] Please consult [email or web address] for contact info under        Signed, Kynnedi Zweig Ninfa Meeker, PA-C  04/26/2021, 8:58 AM

## 2021-04-26 NOTE — Progress Notes (Signed)
Pts spouse and daughter at beside - family concerned about the pain in pts left arm and "spiral down" in health since Friday. Educated pt and family about pain control management.  Pt and family verbalized understanding.  In addition, family concerned about decline in physical mobility.  Physical therapy only able to sit pt on the side of the bed today.  MD notified of family concerns.  Will conitnue to monitor and educate.

## 2021-04-26 NOTE — Evaluation (Signed)
Physical Therapy Re-Evaluation Patient Details Name: Stephen [name] MRN: [id number] DOB: [birth year] Today's Date: 04/26/2021  History of Present Illness  75 y.o. male with a hx of  pulmonary hypertension, chronic diastolic CHF, CAD s/p L/R heart cath 2018, R heart cath 6/22,, pulmonary fibrosis s/p wedge resection, obesity, COPD on home oxygen, OSA on CPAP, DM2, CKD, renal artery stenosis, who came in 10/18 with chest pain/NSTEMI.  Clinical Impression  Pt with a significant change in status, now with very limited mobility and activity tolerance. Just 5 days ago he was able to ambulate 27ft, yesterday and today he struggled to even sit and could do very little activity.  Pt with O2 maintaining in the high 80s low 90s with session but "puffing" breathing the entire time along with some tremoring.  Pt struggled with bed mobility, c/o general pain with most activity and frankly could not tolerate much of anything today despite showing reasonable effort when cued.  Pt's wife adamant that she does not want to having him go to rehab, attempted to show and educate how truly limited he is at this time and that the only safe and clinically appropriate recommendation at this time would have to be a higher level of assist than she will be able to manage safely at home.      Recommendations for follow up therapy are one component of a multi-disciplinary discharge planning process, led by the attending physician.  Recommendations may be updated based on patient status, additional functional criteria and insurance authorization.  Follow Up Recommendations Skilled nursing-short term rehab (<3 hours/day)    Assistance Recommended at Discharge Frequent or constant Supervision/Assistance  Functional Status Assessment Patient has had a recent decline in their functional status and demonstrates the ability to make significant improvements in function in a reasonable and predictable amount of time.  Equipment  Recommendations  Rolling walker (2 wheels);3in1 (PT);Wheelchair (measurements PT);Hospital bed    Recommendations for Other Services       Precautions / Restrictions Restrictions Weight Bearing Restrictions: No      Mobility  Bed Mobility Overal bed mobility: Needs Assistance Bed Mobility: Supine to Sit;Sit to Supine;Rolling Rolling: Mod assist   Supine to sit: Mod assist Sit to supine: Max assist   General bed mobility comments: Pt able to get LEs toward EOB, showed good effort trying to elevate trunk with UE/HHA but unable to lift torso at all, heavy assist to rise to sitting.  Later after failed BSC attempt pt able to give some assist with rolling to get to bed pan, but ultimately needed considerable assist    Transfers Overall transfer level: Needs assistance Equipment used: Rolling walker (2 wheels) Transfers: Sit to/from Stand Sit to Stand: Max assist           General transfer comment: Pt interested in getting to [facility] and we made an attempt; however he was unable to sustain standing w/o max assist and even with heavy unweighting to try to take a step he was unable.  Deferred further standing after ~10 seconds of heavily assisted effort    Ambulation/Gait             General Gait Details: unable to manage even a heavily assisted shuffle step - of note pt walked ~90 ft 5 days ago (10/20)  Stairs            Wheelchair Mobility    Modified Rankin (Stroke Patients Only)       Balance Overall balance assessment: Needs assistance  Sitting balance-Leahy Scale: Poor Sitting balance - Comments: assisted pt to sitting and then getting square up to EOB, still unable to maintain sitting with lean back and to the R much of the time needing direct assist just to maintain sitting     Standing balance-Leahy Scale: Zero Standing balance comment: assist to attain/maintain upright, unable to maintain standing w/o heavy assist                              Pertinent Vitals/Pain Pain Assessment: Faces Faces Pain Scale: Hurts even more Pain Location: L UE    Home Living Family/patient expects to be discharged to:: Unsure Living Arrangements: Spouse/significant other Available Help at Discharge: Family;Available 24 hours/day Type of Home: House Home Access: Ramped entrance       Home Layout: One level Home Equipment: None      Prior Function Prior Level of Function : Independent/Modified Independent                     Hand Dominance        Extremity/Trunk Assessment   Upper Extremity Assessment Upper Extremity Assessment: Generalized weakness (grossly 3-/5, hesitant to fully use L UE) RUE Deficits / Details: grossly 3-/5 t/o    Lower Extremity Assessment Lower Extremity Assessment: Generalized weakness (pt able to do some AROM with LEs, but very guarded and hesitant.  Difficult to test but appears grossly 3+/5 t/o)       Communication   Communication:  (Pt with essentially constant "puffing" breath with minimal actual verbalization)  Cognition Arousal/Alertness: Awake/alert Behavior During Therapy: Anxious;Restless Overall Cognitive Status: Within Functional Limits for tasks assessed                                          General Comments General comments (skin integrity, edema, etc.): Pt felt the need to have a BM, attempted to assist to [facility] but he simply did not have the strength/tolerance/mobility - ultimately assisted to bed pan    Exercises     Assessment/Plan    PT Assessment Patient needs continued PT services  PT Problem List Decreased strength;Decreased mobility;Decreased safety awareness;Decreased activity tolerance;Decreased balance       PT Treatment Interventions DME instruction;Therapeutic exercise;Gait training;Balance training;Neuromuscular re-education;Functional mobility training;Therapeutic activities;Patient/family education    PT Goals (Current goals can be  found in the Care Plan section)  Acute Rehab PT Goals Patient Stated Goal: go home PT Goal Formulation: With patient/family Time For Goal Achievement: 05/10/21 Potential to Achieve Goals: Fair    Frequency Min 2X/week   Barriers to discharge        Co-evaluation               AM-PAC PT "6 Clicks" Mobility  Outcome Measure Help needed turning from your back to your side while in a flat bed without using bedrails?: A Lot Help needed moving from lying on your back to sitting on the side of a flat bed without using bedrails?: Total Help needed moving to and from a bed to a chair (including a wheelchair)?: Total Help needed standing up from a chair using your arms (e.g., wheelchair or bedside chair)?: Total Help needed to walk in hospital room?: Total Help needed climbing 3-5 steps with a railing? : Total 6 Click Score: 7    End of Session  Equipment Utilized During Treatment: Oxygen Activity Tolerance: Patient limited by pain;Patient limited by fatigue Patient left: with bed alarm set;with call bell/phone within reach;with family/visitor present Nurse Communication: Mobility status (on bed pan) PT Visit Diagnosis: Unsteadiness on feet (R26.81);Other abnormalities of gait and mobility (R26.89);Muscle weakness (generalized) (M62.81);Difficulty in walking, not elsewhere classified (R26.2)    Time: 2897-9150 PT Time Calculation (min) (ACUTE ONLY): 32 min   Charges:   PT Evaluation $PT Re-evaluation: 1 Re-eval PT Treatments $Therapeutic Activity: 8-22 mins        Kreg Shropshire, DPT 04/26/2021, 6:13 PM

## 2021-04-27 ENCOUNTER — Other Ambulatory Visit: Payer: Self-pay | Admitting: Cardiovascular Disease

## 2021-04-27 ENCOUNTER — Inpatient Hospital Stay: Payer: Medicare HMO

## 2021-04-27 DIAGNOSIS — I214 Non-ST elevation (NSTEMI) myocardial infarction: Secondary | ICD-10-CM | POA: Diagnosis not present

## 2021-04-27 DIAGNOSIS — I2510 Atherosclerotic heart disease of native coronary artery without angina pectoris: Secondary | ICD-10-CM | POA: Diagnosis not present

## 2021-04-27 DIAGNOSIS — G9341 Metabolic encephalopathy: Secondary | ICD-10-CM | POA: Diagnosis not present

## 2021-04-27 DIAGNOSIS — I5033 Acute on chronic diastolic (congestive) heart failure: Secondary | ICD-10-CM | POA: Diagnosis not present

## 2021-04-27 DIAGNOSIS — J9621 Acute and chronic respiratory failure with hypoxia: Secondary | ICD-10-CM | POA: Diagnosis not present

## 2021-04-27 LAB — CBC
HCT: 29.8 % — ABNORMAL LOW (ref 39.0–52.0)
Hemoglobin: 9.8 g/dL — ABNORMAL LOW (ref 13.0–17.0)
MCH: 28.2 pg (ref 26.0–34.0)
MCHC: 32.9 g/dL (ref 30.0–36.0)
MCV: 85.9 fL (ref 80.0–100.0)
Platelets: 214 10*3/uL (ref 150–400)
RBC: 3.47 MIL/uL — ABNORMAL LOW (ref 4.22–5.81)
RDW: 15.7 % — ABNORMAL HIGH (ref 11.5–15.5)
WBC: 10.9 10*3/uL — ABNORMAL HIGH (ref 4.0–10.5)
nRBC: 0 % (ref 0.0–0.2)

## 2021-04-27 LAB — BASIC METABOLIC PANEL
Anion gap: 9 (ref 5–15)
BUN: 86 mg/dL — ABNORMAL HIGH (ref 8–23)
CO2: 25 mmol/L (ref 22–32)
Calcium: 8.2 mg/dL — ABNORMAL LOW (ref 8.9–10.3)
Chloride: 98 mmol/L (ref 98–111)
Creatinine, Ser: 2.84 mg/dL — ABNORMAL HIGH (ref 0.61–1.24)
GFR, Estimated: 23 mL/min — ABNORMAL LOW (ref >60)
Glucose, Bld: 213 mg/dL — ABNORMAL HIGH (ref 70–99)
Potassium: 4.6 mmol/L (ref 3.5–5.1)
Sodium: 132 mmol/L — ABNORMAL LOW (ref 135–145)

## 2021-04-27 LAB — GLUCOSE, CAPILLARY
Glucose-Capillary: 197 mg/dL — ABNORMAL HIGH (ref 70–99)
Glucose-Capillary: 249 mg/dL — ABNORMAL HIGH (ref 70–99)
Glucose-Capillary: 191 mg/dL — ABNORMAL HIGH (ref 70–99)
Glucose-Capillary: 169 mg/dL — ABNORMAL HIGH (ref 70–99)

## 2021-04-27 MED ORDER — TORSEMIDE 20 MG PO TABS
40.0000 mg | ORAL_TABLET | Freq: Every day | ORAL | Status: DC
  Administered 2021-04-27 – 2021-04-29 (×3): 40 mg via ORAL
  Filled 2021-04-27 (×3): qty 2

## 2021-04-27 MED ORDER — CYANOCOBALAMIN 1000 MCG/ML IJ SOLN
1000.0000 ug | Freq: Every day | INTRAMUSCULAR | Status: AC
  Administered 2021-04-27 – 2021-04-29 (×3): 1000 ug via INTRAMUSCULAR
  Filled 2021-04-27 (×3): qty 1

## 2021-04-27 MED ORDER — CARVEDILOL 25 MG PO TABS
25.0000 mg | ORAL_TABLET | Freq: Two times a day (BID) | ORAL | Status: DC
  Administered 2021-04-27 – 2021-04-30 (×7): 25 mg via ORAL
  Filled 2021-04-27 (×7): qty 1

## 2021-04-27 NOTE — Progress Notes (Addendum)
Patient ID: Stephen [name], male   DOB: 09-Jul-1945, 75 y.o.   MRN: [id number] [facility] PROGRESS NOTE  QUANDARIUS NILL DEY:[id number] DOB: [birth year] DOA: 04/17/2021 PCP: [name], MD  HPI/Subjective: Patient initially admitted with NSTEMI treated medically secondary to chronic kidney disease.  Patient then had altered mental status over the weekend secondary to a urinary infection.  Patient then became very weak.  Started to feel little bit better.  Some left arm pain Khan, superficial thrombophlebitis.  Objective: Vitals:   04/27/21 1159 04/27/21 1355  BP: (!) 170/55   Pulse: 61 63  Resp: 18 20  Temp: 98.1 F (36.7 C)   SpO2: 96% 93%    Intake/Output Summary (Last 24 hours) at 04/27/2021 1404 Last data filed at 04/27/2021 1031 Gross per 24 hour  Intake 980 ml  Output 550 ml  Net 430 ml   Filed Weights   04/24/21 0500 04/25/21 0433 04/26/21 0643  Weight: 123.3 kg 123.7 kg 123.1 kg    ROS: Review of Systems  Respiratory:  Negative for shortness of breath.   Cardiovascular:  Negative for chest pain.  Gastrointestinal:  Negative for abdominal pain.  Musculoskeletal:  Positive for joint pain.  Exam: Physical Exam HENT:     Head: Normocephalic.  Eyes:     General: Lids are normal.     Conjunctiva/sclera: Conjunctivae normal.  Cardiovascular:     Rate and Rhythm: Normal rate and regular rhythm.     Heart sounds: Normal heart sounds, S1 normal and S2 normal.  Pulmonary:     Breath sounds: Examination of the right-lower field reveals decreased breath sounds. Examination of the left-lower field reveals decreased breath sounds. Decreased breath sounds present. No wheezing, rhonchi or rales.  Abdominal:     Palpations: Abdomen is soft.     Tenderness: There is no abdominal tenderness.  Musculoskeletal:     Right ankle: Swelling present.     Left ankle: Swelling present.  Skin:    Comments: Slight redness left antecubital.  Neurological:     Mental  Status: He is alert and oriented to person, place, and time.      Scheduled Meds:  aspirin EC  81 mg Oral Daily   atorvastatin  80 mg Oral QHS   calcitRIOL  0.25 mcg Oral Daily   carvedilol  25 mg Oral BID WC   cloNIDine  0.3 mg Oral TID   clopidogrel  75 mg Oral Daily   dorzolamide-timolol  1 drop Both Eyes BID   enoxaparin (LOVENOX) injection  30 mg Subcutaneous Q24H   ezetimibe  10 mg Oral Daily   fluconazole  100 mg Oral Daily   fluticasone furoate-vilanterol  1 puff Inhalation Daily   gabapentin  100 mg Oral BID   hydrALAZINE  100 mg Oral TID   insulin aspart  0-15 Units Subcutaneous TID WC   insulin aspart  0-5 Units Subcutaneous QHS   insulin aspart  8 Units Subcutaneous TID WC   insulin glargine-yfgn  45 Units Subcutaneous QHS   ipratropium-albuterol  3 mL Nebulization Q6H   isosorbide mononitrate  120 mg Oral Daily   levothyroxine  25 mcg Oral Weekly   levothyroxine  50 mcg Oral Once per day on Mon Tue Wed Thu Fri Sat   montelukast  10 mg Oral Daily   multivitamin with minerals  1 tablet Oral Daily   pantoprazole  40 mg Oral Daily   polyethylene glycol  17 g Oral Daily   Ensure Max  Protein  11 oz Oral BID   sodium chloride flush  3 mL Intravenous Q12H   tamsulosin  0.4 mg Oral QHS   torsemide  40 mg Oral Daily   Continuous Infusions:  sodium chloride     cefTRIAXone (ROCEPHIN)  IV 1 g (04/27/21 1202)    Assessment/Plan:  Acute metabolic encephalopathy likely secondary to fevers and UTI.  Received 5 days of Rocephin.  T-max 101 on 04/26/2021.  We will continue 7 days of antibiotics.  E. coli and Serratia growing out of cultures. NSTEMI treated medically secondary to chronic kidney disease.  Continue Coreg, Imdur, Plavix, atorvastatin. Acute on chronic hypoxic hypercapnic respiratory failure.  Initially presenting in respiratory distress requiring BiPAP.  Patient does have his own CPAP at home.  Patient does have interstitial lung disease and pulmonary artery  hypertension.  Completed steroids during the hospital course.  Lasix has been on hold with creatinine peaking at 3.37. Acute on chronic diastolic congestive heart failure.  Lasix on hold.  Continue to monitor creatinine closely Interstitial lung disease pulmonary hypertension history of COPD.  Finish steroids. Acute kidney injury on chronic kidney disease stage IV.  Baseline creatinine 2.6.  Creatinine 2.84 today and maximum creatinine 3.37. Hypertensive emergency on presentation.  Patient still has hypertension.  Continue Imdur Coreg and hydralazine Type 2 diabetes mellitus with chronic kidney disease stage IV.  Continue glargine insulin and sliding scale insulin Obstructive sleep apnea on CPAP Morbid obesity with a BMI of 45.1 Thrombocytopenia has resolved Thrush on Diflucan Superficial thrombophlebitis left arm.  Conservative management.  Ice as needed. B12 deficiency will give IM B12 injections for 3 days.        Code Status:     Code Status Orders  (From admission, onward)           Start     Ordered   04/17/21 2339  Full code  Continuous        04/17/21 2341           Code Status History     Date Active Date Inactive Code Status Order ID Comments User Context   12/06/2020 1037 12/06/2020 1624 Full Code [id number]  Wellington Hampshire, MD Inpatient   08/19/2020 1229 08/19/2020 1849 Full Code [id number]  Minna Merritts, MD Inpatient   09/20/2018 1114 09/22/2018 2127 Full Code [id number]  Bettey Costa, MD ED   01/16/2017 0035 01/18/2017 1702 Full Code [id number]  Lance Coon, MD ED   01/11/2017 1654 01/13/2017 1736 Full Code [id number]  Demetrios Loll, MD Inpatient   01/11/2017 1341 01/11/2017 1654 Full Code [id number]  Wellington Hampshire, MD Inpatient   03/20/2012 2310 03/21/2012 1350 Full Code [id number]  Clayton Bibles, PA ED      Family Communication: Spoke with family at the bedside Disposition Plan: Status is: Inpatient  Consultants: Cardiology  Antibiotics: Rocephin  Time spent:  28 minutes  [place]

## 2021-04-27 NOTE — Progress Notes (Signed)
Progress Note  Patient Name: Stephen [name] Date of Encounter: 04/27/2021  [facility] Cardiologist: Ida Rogue, MD   Subjective   Feels tired, shortness of breath slowly improving, BiPAP mask on.  Inpatient Medications    Scheduled Meds:  aspirin EC  81 mg Oral Daily   atorvastatin  80 mg Oral QHS   calcitRIOL  0.25 mcg Oral Daily   carvedilol  25 mg Oral BID WC   cloNIDine  0.3 mg Oral TID   clopidogrel  75 mg Oral Daily   cyanocobalamin  1,000 mcg Intramuscular Daily   dorzolamide-timolol  1 drop Both Eyes BID   enoxaparin (LOVENOX) injection  30 mg Subcutaneous Q24H   ezetimibe  10 mg Oral Daily   fluconazole  100 mg Oral Daily   fluticasone furoate-vilanterol  1 puff Inhalation Daily   gabapentin  100 mg Oral BID   hydrALAZINE  100 mg Oral TID   insulin aspart  0-15 Units Subcutaneous TID WC   insulin aspart  0-5 Units Subcutaneous QHS   insulin aspart  8 Units Subcutaneous TID WC   insulin glargine-yfgn  45 Units Subcutaneous QHS   ipratropium-albuterol  3 mL Nebulization Q6H   isosorbide mononitrate  120 mg Oral Daily   levothyroxine  25 mcg Oral Weekly   levothyroxine  50 mcg Oral Once per day on Mon Tue Wed Thu Fri Sat   montelukast  10 mg Oral Daily   multivitamin with minerals  1 tablet Oral Daily   pantoprazole  40 mg Oral Daily   polyethylene glycol  17 g Oral Daily   Ensure Max Protein  11 oz Oral BID   sodium chloride flush  3 mL Intravenous Q12H   tamsulosin  0.4 mg Oral QHS   torsemide  40 mg Oral Daily   Continuous Infusions:  sodium chloride     cefTRIAXone (ROCEPHIN)  IV 1 g (04/27/21 1202)   PRN Meds: sodium chloride, acetaminophen, albuterol, docusate sodium, fluticasone, guaiFENesin-dextromethorphan, hydrALAZINE, HYDROcodone-acetaminophen, morphine injection, nitroGLYCERIN, ondansetron (ZOFRAN) IV, sodium chloride, sodium chloride flush   Vital Signs    Vitals:   04/27/21 0807 04/27/21 1159 04/27/21 1355 04/27/21 1534  BP:   (!) 170/55  138/60  Pulse: 72 61 63 63  Resp: 20 18 20 18   Temp:  98.1 F (36.7 C)  98.3 F (36.8 C)  TempSrc:      SpO2: 96% 96% 93% 95%  Weight:      Height:        Intake/Output Summary (Last 24 hours) at 04/27/2021 1602 Last data filed at 04/27/2021 1529 Gross per 24 hour  Intake 1200 ml  Output 2400 ml  Net -1200 ml   Last 3 Weights 04/26/2021 04/25/2021 04/24/2021  Weight (lbs) 271 lb 6.2 oz 272 lb 11.3 oz 271 lb 13.2 oz  Weight (kg) 123.1 kg 123.7 kg 123.3 kg  Some encounter information is confidential and restricted. Go to Review Flowsheets activity to see all data.      Telemetry    Sinus rhythm, 66- Personally Reviewed  ECG    No new tracing reviewed- Personally Reviewed  Physical Exam   GEN: No acute distress.   Neck: No JVD Cardiac: RRR, no murmurs, rubs, or gallops.  Respiratory: Diminished breath sounds at bases GI: Soft, nontender, non-distended  MS: 2+ edema; No deformity. Neuro:  Nonfocal  Psych: Normal affect   Labs    High Sensitivity Troponin:   Recent Labs  Lab 04/05/21 2016 04/17/21 2101  04/18/21 0505 04/18/21 0658 04/18/21 1240  TROPONINIHS 107* 74* 2,129* 1,997* 1,842*     Chemistry Recent Labs  Lab 04/22/21 2058 04/23/21 0346 04/25/21 0741 04/26/21 0436 04/27/21 0446  NA 136   < > 136 132* 132*  K 4.6   < > 4.4 4.4 4.6  CL 99   < > 99 98 98  CO2 28   < > 26 25 25   GLUCOSE 293*   < > 232* 159* 213*  BUN 78*   < > 89* 80* 86*  CREATININE 3.27*   < > 3.17* 3.07* 2.84*  CALCIUM 8.5*   < > 8.3* 8.1* 8.2*  MG 2.2  --   --   --   --   PROT 6.1*  --   --   --   --   ALBUMIN 2.5*  --   --   --   --   AST 20  --   --   --   --   ALT 26  --   --   --   --   ALKPHOS 51  --   --   --   --   BILITOT 0.6  --   --   --   --   GFRNONAA 19*   < > 20* 21* 23*  ANIONGAP 9   < > 11 9 9    < > = values in this interval not displayed.    Lipids No results for input(s): CHOL, TRIG, HDL, LABVLDL, LDLCALC, CHOLHDL in the last 168 hours.   Hematology Recent Labs  Lab 04/25/21 0741 04/26/21 0436 04/27/21 0446  WBC 10.3 10.3 10.9*  RBC 3.70* 3.49* 3.47*  HGB 10.4* 9.7* 9.8*  HCT 32.5* 30.4* 29.8*  MCV 87.8 87.1 85.9  MCH 28.1 27.8 28.2  MCHC 32.0 31.9 32.9  RDW 15.7* 15.8* 15.7*  PLT 174 184 214   Thyroid  Recent Labs  Lab 04/23/21 0346  TSH 1.361  FREET4 0.82    BNPNo results for input(s): BNP, PROBNP in the last 168 hours.  DDimer No results for input(s): DDIMER in the last 168 hours.   Radiology    US Venous Img Upper Uni Left (DVT)  Result Date: 04/27/2021 CLINICAL DATA:  Left upper extremity pain and edema. EXAM: LEFT UPPER EXTREMITY VENOUS DOPPLER ULTRASOUND TECHNIQUE: Gray-scale sonography with graded compression, as well as color Doppler and duplex ultrasound were performed to evaluate the upper extremity deep venous system from the level of the subclavian vein and including the jugular, axillary, basilic, radial, ulnar and upper cephalic vein. Spectral Doppler was utilized to evaluate flow at rest and with distal augmentation maneuvers. COMPARISON:  None. FINDINGS: Contralateral Subclavian Vein: Respiratory phasicity is normal and symmetric with the symptomatic side. No evidence of thrombus. Normal compressibility. Internal Jugular Vein: No evidence of thrombus. Normal compressibility, respiratory phasicity and response to augmentation. Subclavian Vein: No evidence of thrombus. Normal compressibility, respiratory phasicity and response to augmentation. Axillary Vein: No evidence of thrombus. Normal compressibility, respiratory phasicity and response to augmentation. Cephalic Vein: Superficial thrombophlebitis noted in the left cephalic vein at the level of the upper arm extending to the antecubital fossa. Basilic Vein: No evidence of thrombus. Normal compressibility, respiratory phasicity and response to augmentation. Brachial Veins: No evidence of thrombus. Normal compressibility, respiratory phasicity and  response to augmentation. Radial Veins: No evidence of thrombus. Normal compressibility, respiratory phasicity and response to augmentation. Ulnar Veins: No evidence of thrombus. Normal compressibility, respiratory phasicity and response  to augmentation. Venous Reflux:  None visualized. Other Findings:  No abnormal fluid collections identified. IMPRESSION: 1. No evidence of DVT within the left upper extremity. 2. Superficial thrombophlebitis of the left cephalic vein in the upper arm. Electronically Signed   By: Aletta Edouard M.D.   On: 04/27/2021 10:27   US Venous Img Upper Uni Right(DVT)  Result Date: 04/26/2021 CLINICAL DATA:  Right upper extremity edema. EXAM: RIGHT UPPER EXTREMITY VENOUS DOPPLER ULTRASOUND TECHNIQUE: Gray-scale sonography with graded compression, as well as color Doppler and duplex ultrasound were performed to evaluate the upper extremity deep venous system from the level of the subclavian vein and including the jugular, axillary, basilic, radial, ulnar and upper cephalic vein. Spectral Doppler was utilized to evaluate flow at rest and with distal augmentation maneuvers. COMPARISON:  None. FINDINGS: Contralateral Subclavian Vein: Respiratory phasicity is normal and symmetric with the symptomatic side. No evidence of thrombus. Normal compressibility. Internal Jugular Vein: No evidence of thrombus. Normal compressibility, respiratory phasicity and response to augmentation. Subclavian Vein: No evidence of thrombus. Normal compressibility, respiratory phasicity and response to augmentation. Axillary Vein: No evidence of thrombus. Normal compressibility, respiratory phasicity and response to augmentation. Cephalic Vein: No evidence of thrombus. Normal compressibility, respiratory phasicity and response to augmentation. Basilic Vein: No evidence of thrombus. Normal compressibility, respiratory phasicity and response to augmentation. Brachial Veins: No evidence of thrombus. Normal  compressibility, respiratory phasicity and response to augmentation. Radial Veins: No evidence of thrombus. Normal compressibility, respiratory phasicity and response to augmentation. Ulnar Veins: No evidence of thrombus. Normal compressibility, respiratory phasicity and response to augmentation. Venous Reflux:  None visualized. Other Findings: No evidence of superficial thrombophlebitis or abnormal fluid collection. IMPRESSION: No evidence of DVT within the right upper extremity. Electronically Signed   By: Aletta Edouard M.D.   On: 04/26/2021 07:51   DG Chest Port 1 View  Result Date: 04/26/2021 CLINICAL DATA:  Shortness of breath EXAM: PORTABLE CHEST 1 VIEW COMPARISON:  04/22/2021 FINDINGS: Stable cardiomediastinal contours. The lung volumes are low. Persistent bilateral interstitial and airspace densities with a mid and lower lung zone predominance. Compared with 04/22/2021 the aeration to lungs is not significantly changed in the interval. IMPRESSION: No change in aeration to the lungs compared with 04/22/2021. Electronically Signed   By: Kerby Moors M.D.   On: 04/26/2021 11:00    Cardiac Studies   Echo 04/18/2021 1. Left ventricular ejection fraction, by estimation, is 60 to 65%. The  left ventricle has normal function. Left ventricular endocardial border  not optimally defined to evaluate regional wall motion. The left  ventricular internal cavity size was mildly  dilated. There is mild left ventricular hypertrophy. Left ventricular  diastolic parameters are consistent with Grade I diastolic dysfunction  (impaired relaxation). Elevated left atrial pressure.   2. Right ventricular systolic function is normal. The right ventricular  size is not well visualized.   3. The mitral valve is abnormal. Trivial mitral valve regurgitation. Mild  mitral stenosis. The mean mitral valve gradient is 5.0 mmHg.   4. Tricuspid valve regurgitation not well assessed.   5. The aortic valve has an  indeterminant number of cusps. There is mild  calcification of the aortic valve. There is moderate thickening of the  aortic valve. Aortic valve regurgitation is trivial. Mild to moderate  aortic valve sclerosis/calcification is  present, without any evidence of aortic stenosis.    Patient Profile     75 y.o. male with history of HFpEF, COPD, CAD presenting with chest pain  and shortness of breath.  Being seen for HFpEF.  Assessment & Plan    1.  HFpEF -Still volume overloaded -Restart torsemide 40 mg daily. -Monitor creatinine.   2.  Elevated troponins, NSTEMI -S/p Heparin x 48 hours. -Currently denies chest pain.  Imdur  120 mg daily. -Aspirin, Plavix, Coreg, Lipitor.   3.  Hypertension -BP elevated -Increase Coreg to 25 mg twice daily. -Continue clonidine 0.3 3 times daily, Imdur, hydralazine  4.  COPD, sleep apnea -Management as per medicine, BiPAP.   Greater than 50% was spent in counseling and coordination of care with patient Total encounter time 35 minutes      Signed, Kate Sable, MD  04/27/2021, 4:02 PM

## 2021-04-27 NOTE — Progress Notes (Signed)
Nutrition Follow-up  DOCUMENTATION CODES:  Morbid obesity  INTERVENTION:  Continue current diet as ordered, encourage PO intake  Ensure Max protein supplement BID, each supplement provides 150kcal and 30g of protein.  MVI po daily   Provided low sodium diet education 10/19  NUTRITION DIAGNOSIS:  Inadequate oral intake related to acute illness as evidenced by per patient/family report.  GOAL:  Patient will meet greater than or equal to 90% of their needs  MONITOR:  PO intake, Supplement acceptance, Labs, Weight trends, Skin, I & O's  REASON FOR ASSESSMENT:  Consult Assessment of nutrition requirement/status  ASSESSMENT:  75 y/o male with h/o DM, OSA, CAD, HTN, CHF, pulmonary hypertension, CKD, ILD and COPD who is admitted with NSTEMI and CHF.  Reviewed recorded intake since last assessment, appears to routinely be accepting ensure max supplements and continues to have good intake of meals. Weight gain noted this admission, cardiology cites an increase in edema. 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  Called room phone and was able to speak with wife, [name]. Reports that pt's appetite has been up and down, eating more at some meals than others but will often save items off his tray and consume them later in the day. Endorses drinking ensures. Wife reports that pt has had difficulty breathing and this has also impacted his ability to eat. No longer requiring continuous BiPAP, but is on intermittently.   For now, will continue current nutrition plan as adequate protein is likely being consumed with ensure max and meal intake.  Noted - Vitamin B12 checked 10/22 and resulted low. No replacement noted at this time in medication list. Will discuss with MD.  PT/OT currently recommending SNF at dc however, noted that spouse would like to take pt home.   Average Meal Intake: 10/19-10/26: 71% intake x 16 recorded meals  Nutritionally Relevant Medications: Scheduled Meds:  atorvastatin   80 mg Oral QHS   calcitRIOL  0.25 mcg Oral Daily   ezetimibe  10 mg Oral Daily   insulin aspart  0-15 Units Subcutaneous TID WC   insulin aspart  0-5 Units Subcutaneous QHS   insulin aspart  8 Units Subcutaneous TID WC   insulin glargine-yfgn  45 Units Subcutaneous QHS   multivitamin with minerals  1 tablet Oral Daily   pantoprazole  40 mg Oral Daily   polyethylene glycol  17 g Oral Daily   Ensure Max Protein  11 oz Oral BID   torsemide  40 mg Oral Daily   Continuous Infusions:  cefTRIAXone (ROCEPHIN)  IV 1 g (04/27/21 1202)   PRN Meds: docusate sodium, ondansetron  Labs Reviewed: Na 132 BUN 86 / creatinine 2.84 Vitamin B12 148 (10/22) SBG ranges from 154-249 mg/dL over the last 24 hours HgbA1C 6.7% (8/22)  NUTRITION - FOCUSED PHYSICAL EXAM: Flowsheet Row Most Recent Value  Orbital Region No depletion  Upper Arm Region No depletion  Thoracic and Lumbar Region No depletion  Buccal Region No depletion  Temple Region No depletion  Clavicle Bone Region No depletion  Clavicle and Acromion Bone Region No depletion  Scapular Bone Region No depletion  Dorsal Hand No depletion  Patellar Region No depletion  Anterior Thigh Region No depletion  Posterior Calf Region Unable to assess  Edema (RD Assessment) Moderate  Hair Reviewed  Eyes Reviewed  Mouth Reviewed  Skin Reviewed  Nails Reviewed   Diet Order:   Diet Order             Diet heart healthy/carb modified Room  service appropriate? Yes; Fluid consistency: Thin  Diet effective now                  EDUCATION NEEDS:  Education needs have been addressed  Skin:  Skin Assessment: Reviewed RN Assessment (ecchymosis)  Last BM:  10/25 - type 6  Height:  Ht Readings from Last 1 Encounters:  04/19/21 5\' 5"  (1.651 m)    Weight:  Wt Readings from Last 1 Encounters:  04/26/21 123.1 kg    Ideal Body Weight:  61.8 kg  BMI:  Body mass index is 45.16 kg/m.  Estimated Nutritional Needs:  Kcal:   2200-2500kcal/day Protein:  110-125g/day Fluid:  1.6-1.9L/day   Ranell Patrick, RD, LDN Clinical Dietitian RD pager # available in [place]  After hours/weekend pager # available in [facility]

## 2021-04-27 NOTE — Progress Notes (Signed)
Occupational Therapy Treatment Patient Details Name: AMEDEE CERRONE MRN: [id number] DOB: 10-18-1945 Today's Date: 04/27/2021   History of present illness 75 y.o. male with a hx of  pulmonary hypertension, chronic diastolic CHF, CAD s/p L/R heart cath 2018, R heart cath 6/22,, pulmonary fibrosis s/p wedge resection, obesity, COPD on home oxygen, OSA on CPAP, DM2, CKD, renal artery stenosis, who came in 10/18 with chest pain/NSTEMI.   OT comments  Pt seen for OT treatment on this date. Session coordinated with PT to address functional mobility. Upon arrival to room, pt awake and seated upright in bed on 4L of O2 via [place]. Wife present throughout session. At beginning of session, pt reporting having BM recently and requesting assistance for peri-care. Pt performed x4 rolling transfers (midline>side-lying position) for posterior peri-care. During bed mobility, pt put forth good effort, however ultimately required MAX A+2 to achieve side-lying position. SpO2 desat 86% during rolling, however able to increase to 90% with cessation of activity and initiation of PLB. Following peri-care and linen change, pt reporting fatigue and requesting to don CPAP mask; RN informed and present at end of session. OT & PT continued conversation with wife regarding discharge OT/PT discharge recommendation, however wife continuing to state preference for returning home with family assist. If wife remains firm in refusal, he will require EMS transport home, wheelchair, hospital bed, 3-in-1 commode and a lift if he does not significantly improve along with +2 assist for mobility and basic care. Will continue to follow POC. Discharge recommendation remains appropriate.     Recommendations for follow up therapy are one component of a multi-disciplinary discharge planning process, led by the attending physician.  Recommendations may be updated based on patient status, additional functional criteria and insurance authorization.     Follow Up Recommendations  Skilled nursing-short term rehab (<3 hours/day)    Assistance Recommended at Discharge Frequent or constant Supervision/Assistance  Equipment Recommendations  BSC       Precautions / Restrictions Precautions Precautions: Sternal Restrictions Weight Bearing Restrictions: No Other Position/Activity Restrictions: Superficial thrombophlebitis of the left cephalic vein in the upper arm       Mobility Bed Mobility Overal bed mobility: Needs Assistance Bed Mobility: Rolling Rolling: Max assist;+2 for safety/equipment         General bed mobility comments: x4 rolling transfers from midline>sidelying for peri-care. Pt able to bend knees and reach RUE toward bedrail, however ultimately requiring MAX A+2 to achieve sidelying position    Transfers                   General transfer comment: Did not attempt d/t pt fatigued folloing bed rolling and requesting CPAP mask         ADL either performed or assessed with clinical judgement   ADL Overall ADL's : Needs assistance/impaired                             Toileting- Clothing Manipulation and Hygiene: Maximal assistance;+2 for physical assistance;Bed level                Cognition Arousal/Alertness: Awake/alert Behavior During Therapy: WFL for tasks assessed/performed Overall Cognitive Status: Within Functional Limits for tasks assessed  Pertinent Vitals/ Pain       Pain Assessment: Faces Faces Pain Scale: Hurts even more Pain Location: L UE Pain Descriptors / Indicators: Sore;Guarding Pain Intervention(s): Limited activity within patient's tolerance;Monitored during session         Frequency  Min 2X/week        Progress Toward Goals  OT Goals(current goals can now be found in the care plan section)  Progress towards OT goals: Progressing toward goals  Acute Rehab OT  Goals Patient Stated Goal: to go home OT Goal Formulation: With patient/family Time For Goal Achievement: 05/09/21 Potential to Achieve Goals: [place] Discharge plan remains appropriate;Frequency remains appropriate    Co-evaluation    PT/OT/SLP Co-Evaluation/Treatment: Yes Reason for Co-Treatment: Complexity of the patient's impairments (multi-system involvement);For patient/therapist safety PT goals addressed during session: Mobility/safety with mobility OT goals addressed during session: ADL's and self-care      AM-PAC OT "6 Clicks" Daily Activity     Outcome Measure   Help from another person eating meals?: A Little Help from another person taking care of personal grooming?: A Little Help from another person toileting, which includes using toliet, bedpan, or urinal?: Total Help from another person bathing (including washing, rinsing, drying)?: A Lot Help from another person to put on and taking off regular upper body clothing?: A Lot Help from another person to put on and taking off regular lower body clothing?: A Lot 6 Click Score: 13    End of Session Equipment Utilized During Treatment: Oxygen  OT Visit Diagnosis: Other abnormalities of gait and mobility (R26.89);Muscle weakness (generalized) (M62.81);Pain Pain - Right/Left: Left Pain - part of body: Hand   Activity Tolerance Patient limited by pain   Patient Left in bed;with call bell/phone within reach;with bed alarm set;with family/visitor present   Nurse Communication Mobility status;Other (comment) (SpO2 during mobility, with pt requesting CPAP mask at end of session)        Time: 1441-1525 OT Time Calculation (min): 44 min  Charges: OT General Charges $OT Visit: 1 Visit OT Treatments $Self Care/Home Management : 23-37 mins  Fredirick Maudlin, OTR/L [place]

## 2021-04-27 NOTE — Progress Notes (Signed)
Physical Therapy Treatment Patient Details Name: Stephen [name] MRN: [id number] DOB: 1945-10-29 Today's Date: 04/27/2021   History of Present Illness 75 y.o. male with a hx of  pulmonary hypertension, chronic diastolic CHF, CAD s/p L/R heart cath 2018, R heart cath 6/22,, pulmonary fibrosis s/p wedge resection, obesity, COPD on home oxygen, OSA on CPAP, DM2, CKD, renal artery stenosis, who came in 10/18 with chest pain/NSTEMI.    PT Comments    Pt received supine in bed, OT identified pt soiled and asking PT to assist with bed mobility. PT encouraged pt participation by cueing to bend knee and reach across body for bed rail. Assist was provided to complete all part-tasks of the roll. 4 reps total with pt maintaining side lying for extended periods, PT assisting to maintain position. Pt presented with SOB between rolling, breaks taken to elevate HOB. SpO2 varied between 87-90% on 6L O2. Pt fatigued by end of session and requesting CPAP. RN notified.   Education provided to pt and wife explaining purpose of SNF rec at d/c and benefits it could have for pt. PT rec wife reach out to members of her community to ask about experiences from people she trusts about various rehab facilities. PT also emphasized current fall risk, quality of life if pt remains bed bound at home and current total dependency on caregiver (wife). Wife will need assistance from at least one other family member if she refuses SNF as pt is currently a +2 for all mobility. Would benefit from skilled PT to address above deficits and promote optimal return to PLOF.     Recommendations for follow up therapy are one component of a multi-disciplinary discharge planning process, led by the attending physician.  Recommendations may be updated based on patient status, additional functional criteria and insurance authorization.  Follow Up Recommendations  Skilled nursing-short term rehab (<3 hours/day)     Assistance Recommended at  Discharge Frequent or constant Supervision/Assistance  Equipment Recommendations  Rolling walker (2 wheels);3in1 (PT);Wheelchair (measurements PT);Hospital bed    Recommendations for Other Services       Precautions / Restrictions Precautions Precautions: Sternal Restrictions Weight Bearing Restrictions: No Other Position/Activity Restrictions: Superficial thrombophlebitis of the left cephalic vein in the upper arm     Mobility  Bed Mobility Overal bed mobility: Needs Assistance Bed Mobility: Rolling Rolling: Max assist;+2 for safety/equipment         General bed mobility comments: MAX Ax2 to complete 4 rolls total with OT to assist pt with hygiene. VC for pt to bend knee and reach across body for bedrail. Assist to maintain position as well.    Transfers                   General transfer comment: deferred - pt fatigued after bed mobility and requesting CPAP    Ambulation/Gait             General Gait Details: defereed - unsafe at this time   Stairs             Wheelchair Mobility    Modified Rankin (Stroke Patients Only)       Balance                                            Cognition Arousal/Alertness: Awake/alert Behavior During Therapy: WFL for tasks assessed/performed Overall Cognitive Status: Within Functional Limits  for tasks assessed                                          Exercises      General Comments        Pertinent Vitals/Pain Pain Assessment: Faces Faces Pain Scale: Hurts even more Pain Location: LUE Pain Descriptors / Indicators: Sore;Guarding Pain Intervention(s): Limited activity within patient's tolerance;Monitored during session;Repositioned    Home Living                          Prior Function            PT Goals (current goals can now be found in the care plan section) Acute Rehab PT Goals Patient Stated Goal: go home PT Goal Formulation: With  patient/family Time For Goal Achievement: 05/10/21 Potential to Achieve Goals: Fair    Frequency    Min 2X/week      PT Plan      Co-evaluation PT/OT/SLP Co-Evaluation/Treatment: Yes Reason for Co-Treatment: Complexity of the patient's impairments (multi-system involvement);For patient/therapist safety PT goals addressed during session: Mobility/safety with mobility OT goals addressed during session: ADL's and self-care      AM-PAC PT "6 Clicks" Mobility   Outcome Measure  Help needed turning from your back to your side while in a flat bed without using bedrails?: A Lot Help needed moving from lying on your back to sitting on the side of a flat bed without using bedrails?: Total Help needed moving to and from a bed to a chair (including a wheelchair)?: Total Help needed standing up from a chair using your arms (e.g., wheelchair or bedside chair)?: Total Help needed to walk in hospital room?: Total Help needed climbing 3-5 steps with a railing? : Total 6 Click Score: 7    End of Session Equipment Utilized During Treatment: Oxygen Activity Tolerance: Patient limited by pain;Patient limited by fatigue Patient left: with bed alarm set;with call bell/phone within reach;with family/visitor present Nurse Communication: Mobility status (on bed pan) PT Visit Diagnosis: Unsteadiness on feet (R26.81);Other abnormalities of gait and mobility (R26.89);Muscle weakness (generalized) (M62.81);Difficulty in walking, not elsewhere classified (R26.2)     Time: 8366-2947 PT Time Calculation (min) (ACUTE ONLY): 35 min  Charges:  $Therapeutic Activity: 8-22 mins                      Patrina Levering PT, DPT 04/27/21 4:10 PM [phone]

## 2021-04-28 DIAGNOSIS — J9601 Acute respiratory failure with hypoxia: Secondary | ICD-10-CM

## 2021-04-28 DIAGNOSIS — I214 Non-ST elevation (NSTEMI) myocardial infarction: Secondary | ICD-10-CM | POA: Diagnosis not present

## 2021-04-28 DIAGNOSIS — R531 Weakness: Secondary | ICD-10-CM | POA: Diagnosis not present

## 2021-04-28 DIAGNOSIS — J9621 Acute and chronic respiratory failure with hypoxia: Secondary | ICD-10-CM | POA: Diagnosis not present

## 2021-04-28 DIAGNOSIS — I5033 Acute on chronic diastolic (congestive) heart failure: Secondary | ICD-10-CM | POA: Diagnosis not present

## 2021-04-28 DIAGNOSIS — I25118 Atherosclerotic heart disease of native coronary artery with other forms of angina pectoris: Secondary | ICD-10-CM | POA: Diagnosis not present

## 2021-04-28 DIAGNOSIS — G9341 Metabolic encephalopathy: Secondary | ICD-10-CM | POA: Diagnosis not present

## 2021-04-28 LAB — GLUCOSE, CAPILLARY
Glucose-Capillary: 80 mg/dL (ref 70–99)
Glucose-Capillary: 110 mg/dL — ABNORMAL HIGH (ref 70–99)
Glucose-Capillary: 128 mg/dL — ABNORMAL HIGH (ref 70–99)
Glucose-Capillary: 113 mg/dL — ABNORMAL HIGH (ref 70–99)

## 2021-04-28 LAB — BASIC METABOLIC PANEL
Anion gap: 9 (ref 5–15)
BUN: 81 mg/dL — ABNORMAL HIGH (ref 8–23)
CO2: 26 mmol/L (ref 22–32)
Calcium: 8.2 mg/dL — ABNORMAL LOW (ref 8.9–10.3)
Chloride: 98 mmol/L (ref 98–111)
Creatinine, Ser: 3.24 mg/dL — ABNORMAL HIGH (ref 0.61–1.24)
GFR, Estimated: 19 mL/min — ABNORMAL LOW (ref >60)
Glucose, Bld: 127 mg/dL — ABNORMAL HIGH (ref 70–99)
Potassium: 4.2 mmol/L (ref 3.5–5.1)
Sodium: 133 mmol/L — ABNORMAL LOW (ref 135–145)

## 2021-04-28 MED ORDER — DOXYCYCLINE HYCLATE 100 MG PO TABS
100.0000 mg | ORAL_TABLET | Freq: Two times a day (BID) | ORAL | Status: DC
  Administered 2021-04-28 – 2021-04-30 (×5): 100 mg via ORAL
  Filled 2021-04-28 (×5): qty 1

## 2021-04-28 NOTE — Progress Notes (Signed)
Patient ID: RUSHAWN CAPSHAW, male   DOB: 1945/08/04, 75 y.o.   MRN: [id number] [facility] PROGRESS NOTE  CAYDYN SPRUNG NFA:[id number] DOB: 03-16-1946 DOA: 04/17/2021 PCP: [name], MD  HPI/Subjective: Patient still with some discomfort in left arm.  Slight redness there.  Unable to sit up without help.  Patient able to straight leg raise.  Patient very weak.  Initially admitted with acute on chronic hypoxic respiratory failure and acute on chronic diastolic congestive heart failure  Objective: Vitals:   04/28/21 1157 04/28/21 1421  BP: (!) 171/78   Pulse: (!) 58   Resp: 19   Temp: 98.4 F (36.9 C)   SpO2: 96% 97%    Intake/Output Summary (Last 24 hours) at 04/28/2021 1448 Last data filed at 04/28/2021 1345 Gross per 24 hour  Intake 1080 ml  Output 3750 ml  Net -2670 ml   Filed Weights   04/25/21 0433 04/26/21 0643 04/28/21 0500  Weight: 123.7 kg 123.1 kg 124 kg    ROS: Review of Systems  Respiratory:  Negative for shortness of breath.   Cardiovascular:  Negative for chest pain.  Gastrointestinal:  Negative for abdominal pain, nausea and vomiting.  Exam: Physical Exam HENT:     Head: Normocephalic.     Mouth/Throat:     Pharynx: No oropharyngeal exudate.  Eyes:     General: Lids are normal.     Conjunctiva/sclera: Conjunctivae normal.  Cardiovascular:     Rate and Rhythm: Normal rate and regular rhythm.     Heart sounds: Normal heart sounds, S1 normal and S2 normal.  Pulmonary:     Breath sounds: No decreased breath sounds, wheezing, rhonchi or rales.  Abdominal:     Palpations: Abdomen is soft.     Tenderness: There is no abdominal tenderness.  Musculoskeletal:     Right lower leg: Swelling present.     Left lower leg: Swelling present.  Skin:    General: Skin is warm.     Comments: Slight redness left antecubital fossa  Neurological:     Mental Status: He is alert and oriented to person, place, and time.      Scheduled Meds:  aspirin  EC  81 mg Oral Daily   atorvastatin  80 mg Oral QHS   calcitRIOL  0.25 mcg Oral Daily   carvedilol  25 mg Oral BID WC   cloNIDine  0.3 mg Oral TID   clopidogrel  75 mg Oral Daily   cyanocobalamin  1,000 mcg Intramuscular Daily   dorzolamide-timolol  1 drop Both Eyes BID   doxycycline  100 mg Oral Q12H   enoxaparin (LOVENOX) injection  30 mg Subcutaneous Q24H   ezetimibe  10 mg Oral Daily   fluconazole  100 mg Oral Daily   fluticasone furoate-vilanterol  1 puff Inhalation Daily   gabapentin  100 mg Oral BID   hydrALAZINE  100 mg Oral TID   insulin aspart  0-15 Units Subcutaneous TID WC   insulin aspart  0-5 Units Subcutaneous QHS   insulin aspart  8 Units Subcutaneous TID WC   insulin glargine-yfgn  45 Units Subcutaneous QHS   ipratropium-albuterol  3 mL Nebulization Q6H   isosorbide mononitrate  120 mg Oral Daily   levothyroxine  25 mcg Oral Weekly   levothyroxine  50 mcg Oral Once per day on Mon Tue Wed Thu Fri Sat   montelukast  10 mg Oral Daily   multivitamin with minerals  1 tablet Oral Daily   pantoprazole  40 mg Oral Daily   polyethylene glycol  17 g Oral Daily   Ensure Max Protein  11 oz Oral BID   sodium chloride flush  3 mL Intravenous Q12H   tamsulosin  0.4 mg Oral QHS   torsemide  40 mg Oral Daily   Continuous Infusions:  sodium chloride     cefTRIAXone (ROCEPHIN)  IV 1 g (04/28/21 1009)    Assessment/Plan:  Generalized weakness.  Patient unable to even sit up in the bed on his own with me in the room.  Able to do a little bit better with physical therapy today.  Asked family to consider rehab. Acute metabolic encephalopathy likely secondary to fevers and UTI.  We will continue 7 days of Rocephin.  E. coli and Serratia growing out of cultures.  Added doxycycline with redness and left antecubital fossa to cover staph aureus. NSTEMI treated medically secondary to chronic kidney disease.  Continue Coreg, Imdur Plavix and atorvastatin. Acute on chronic hypoxic  hypercapnic respiratory failure.  Initially presenting in respiratory distress requiring BiPAP.  Patient does have his own CPAP at home.  Patient has interstitial lung disease and pulmonary artery hypertension.  Patient completed steroids during the hospital course.  Cardiology restarted torsemide. Acute on chronic diastolic congestive heart failure.  Cardiology restarted torsemide.  Continue Coreg. Interstitial lung disease and pulmonary hypertension and COPD.  Completed steroids during the hospital course.  Currently on 4 L of oxygen. Essential hypertension.  Continue Imdur, Coreg and hydralazine Type 2 diabetes mellitus with chronic kidney disease stage IV.  On glargine insulin and sliding scale insulin. Obstructive sleep apnea on CPAP Morbid obesity with a BMI of 45.49 Thrush on Diflucan Superficial thrombophlebitis left arm.  Conservative management.  Ice as needed.  Added doxycycline just in case staff aureus infection. Vitamin B12 deficiency giving IM B12 injections.        Code Status:     Code Status Orders  (From admission, onward)           Start     Ordered   04/17/21 2339  Full code  Continuous        04/17/21 2341           Code Status History     Date Active Date Inactive Code Status Order ID Comments User Context   12/06/2020 1037 12/06/2020 1624 Full Code [id number]  Wellington Hampshire, MD Inpatient   08/19/2020 1229 08/19/2020 1849 Full Code [id number]  Minna Merritts, MD Inpatient   09/20/2018 1114 09/22/2018 2127 Full Code [id number]  Bettey Costa, MD ED   01/16/2017 0035 01/18/2017 1702 Full Code [id number]  Lance Coon, MD ED   01/11/2017 1654 01/13/2017 1736 Full Code [id number]  Demetrios Loll, MD Inpatient   01/11/2017 1341 01/11/2017 1654 Full Code [id number]  Wellington Hampshire, MD Inpatient   03/20/2012 2310 03/21/2012 1350 Full Code [id number]  Clayton Bibles, PA ED      Family Communication: Spoke with wife at the bedside Disposition Plan: Status is:  Inpatient  Consultants: Cardiology  Antibiotics: Rocephin and doxycycline  Time spent: 27 minutes  [place]

## 2021-04-28 NOTE — Progress Notes (Signed)
04/28/21 1135  Clinical Encounter Type  Visited With Patient and family together  Visit Type Initial;Spiritual support  Spiritual Encounters  Spiritual Needs Other (Comment);Prayer (encouragement)  Chaplain Burris met Pt and spouse briefly. Visit interrupted by PT but continued to speak with spouse to assess needs. Faith is very important and receiving support from their faith community. Encouraged spouse in her self-care; Pt needs ongoing support and encouragement as he contemplates need for rehab. Will continue to follow. 

## 2021-04-28 NOTE — Progress Notes (Signed)
Progress Note  Patient Name: Stephen [name] Date of Encounter: 04/28/2021  [facility] Cardiologist: Ida Rogue, MD   Subjective   Feels better this morning. No chest pain. Documented UOP 2.2 L for the past 24 hours on restarted torsemide with a net - 10 L for the admission. No labs this morning. BiPAP at night. On 4 L via nasal cannula during the day. Has not been out of bed.   Inpatient Medications    Scheduled Meds:  aspirin EC  81 mg Oral Daily   atorvastatin  80 mg Oral QHS   calcitRIOL  0.25 mcg Oral Daily   carvedilol  25 mg Oral BID WC   cloNIDine  0.3 mg Oral TID   clopidogrel  75 mg Oral Daily   cyanocobalamin  1,000 mcg Intramuscular Daily   dorzolamide-timolol  1 drop Both Eyes BID   doxycycline  100 mg Oral Q12H   enoxaparin (LOVENOX) injection  30 mg Subcutaneous Q24H   ezetimibe  10 mg Oral Daily   fluconazole  100 mg Oral Daily   fluticasone furoate-vilanterol  1 puff Inhalation Daily   gabapentin  100 mg Oral BID   hydrALAZINE  100 mg Oral TID   insulin aspart  0-15 Units Subcutaneous TID WC   insulin aspart  0-5 Units Subcutaneous QHS   insulin aspart  8 Units Subcutaneous TID WC   insulin glargine-yfgn  45 Units Subcutaneous QHS   ipratropium-albuterol  3 mL Nebulization Q6H   isosorbide mononitrate  120 mg Oral Daily   levothyroxine  25 mcg Oral Weekly   levothyroxine  50 mcg Oral Once per day on Mon Tue Wed Thu Fri Sat   montelukast  10 mg Oral Daily   multivitamin with minerals  1 tablet Oral Daily   pantoprazole  40 mg Oral Daily   polyethylene glycol  17 g Oral Daily   Ensure Max Protein  11 oz Oral BID   sodium chloride flush  3 mL Intravenous Q12H   tamsulosin  0.4 mg Oral QHS   torsemide  40 mg Oral Daily   Continuous Infusions:  sodium chloride     cefTRIAXone (ROCEPHIN)  IV 1 g (04/28/21 1009)   PRN Meds: sodium chloride, acetaminophen, albuterol, docusate sodium, fluticasone, guaiFENesin-dextromethorphan, hydrALAZINE,  HYDROcodone-acetaminophen, morphine injection, nitroGLYCERIN, ondansetron (ZOFRAN) IV, sodium chloride, sodium chloride flush   Vital Signs    Vitals:   04/28/21 0500 04/28/21 0749 04/28/21 0831 04/28/21 0859  BP:  (!) 148/70    Pulse:  (!) 57  73  Resp:  17  18  Temp:  98.6 F (37 C)    TempSrc:      SpO2:  97% 98% 96%  Weight: 124 kg     Height:        Intake/Output Summary (Last 24 hours) at 04/28/2021 1107 Last data filed at 04/28/2021 0500 Gross per 24 hour  Intake 720 ml  Output 2450 ml  Net -1730 ml   Last 3 Weights 04/28/2021 04/26/2021 04/25/2021  Weight (lbs) 273 lb 5.9 oz 271 lb 6.2 oz 272 lb 11.3 oz  Weight (kg) 124 kg 123.1 kg 123.7 kg  Some encounter information is confidential and restricted. Go to Review Flowsheets activity to see all data.      Telemetry    SR - Personally Reviewed  ECG    No new tracing reviewed - Personally Reviewed  Physical Exam   GEN: No acute distress.   Neck: JVD difficult to assess  secondary to body habitus. Cardiac: RRR, no murmurs, rubs, or gallops.  Respiratory: Diminished breath sounds at bases GI: Soft, nontender, non-distended  MS: 2+ edema; No deformity. Neuro:  Nonfocal  Psych: Normal affect   Labs    High Sensitivity Troponin:   Recent Labs  Lab 04/05/21 2016 04/17/21 2101 04/18/21 0505 04/18/21 0658 04/18/21 1240  TROPONINIHS 107* 74* 2,129* 1,997* 1,842*     Chemistry Recent Labs  Lab 04/22/21 2058 04/23/21 0346 04/25/21 0741 04/26/21 0436 04/27/21 0446  NA 136   < > 136 132* 132*  K 4.6   < > 4.4 4.4 4.6  CL 99   < > 99 98 98  CO2 28   < > 26 25 25   GLUCOSE 293*   < > 232* 159* 213*  BUN 78*   < > 89* 80* 86*  CREATININE 3.27*   < > 3.17* 3.07* 2.84*  CALCIUM 8.5*   < > 8.3* 8.1* 8.2*  MG 2.2  --   --   --   --   PROT 6.1*  --   --   --   --   ALBUMIN 2.5*  --   --   --   --   AST 20  --   --   --   --   ALT 26  --   --   --   --   ALKPHOS 51  --   --   --   --   BILITOT 0.6  --    --   --   --   GFRNONAA 19*   < > 20* 21* 23*  ANIONGAP 9   < > 11 9 9    < > = values in this interval not displayed.    Lipids No results for input(s): CHOL, TRIG, HDL, LABVLDL, LDLCALC, CHOLHDL in the last 168 hours.  Hematology Recent Labs  Lab 04/25/21 0741 04/26/21 0436 04/27/21 0446  WBC 10.3 10.3 10.9*  RBC 3.70* 3.49* 3.47*  HGB 10.4* 9.7* 9.8*  HCT 32.5* 30.4* 29.8*  MCV 87.8 87.1 85.9  MCH 28.1 27.8 28.2  MCHC 32.0 31.9 32.9  RDW 15.7* 15.8* 15.7*  PLT 174 184 214   Thyroid  Recent Labs  Lab 04/23/21 0346  TSH 1.361  FREET4 0.82    BNPNo results for input(s): BNP, PROBNP in the last 168 hours.  DDimer No results for input(s): DDIMER in the last 168 hours.   Radiology    US Venous Img Upper Uni Left (DVT)  Result Date: 04/27/2021 IMPRESSION: 1. No evidence of DVT within the left upper extremity. 2. Superficial thrombophlebitis of the left cephalic vein in the upper arm. Electronically Signed   By: Aletta Edouard M.D.   On: 04/27/2021 10:27    Cardiac Studies   Echo 04/18/2021 1. Left ventricular ejection fraction, by estimation, is 60 to 65%. The  left ventricle has normal function. Left ventricular endocardial border  not optimally defined to evaluate regional wall motion. The left  ventricular internal cavity size was mildly  dilated. There is mild left ventricular hypertrophy. Left ventricular  diastolic parameters are consistent with Grade I diastolic dysfunction  (impaired relaxation). Elevated left atrial pressure.   2. Right ventricular systolic function is normal. The right ventricular  size is not well visualized.   3. The mitral valve is abnormal. Trivial mitral valve regurgitation. Mild  mitral stenosis. The mean mitral valve gradient is 5.0 mmHg.   4. Tricuspid valve regurgitation not  well assessed.   5. The aortic valve has an indeterminant number of cusps. There is mild  calcification of the aortic valve. There is moderate thickening of  the  aortic valve. Aortic valve regurgitation is trivial. Mild to moderate  aortic valve sclerosis/calcification is  present, without any evidence of aortic stenosis.    Patient Profile     75 y.o. male with history of HFpEF, COPD, CAD presenting with chest pain and shortness of breath.  Being seen for HFpEF.  Assessment & Plan    1.  HFpEF: -Still volume overloaded -Good UOP with resumed torsemide 40 mg daily on 10/26 -Check renal function -Daily weights -Strict I/O   2.  Elevated troponins, NSTEMI: -S/p Heparin x 48 hours -Currently denies chest pain   -Imdur 120 mg daily -Aspirin, Plavix, Coreg, Lipitor -Cath deferred with acute on CKD   3.  Hypertension: -BP reasonably controlled  -Continue Coreg, clonidine, Imdur, hydralazine  4.  COPD, sleep apnea: -Management as per medicine -Needs BiPAP for sleep and naps  5. Acute on CKD stage IV: -Improving on last check -Monitor with diuresis   Ongoing noncardiac issues per IM    Greater than 50% was spent in counseling and coordination of care with patient Total encounter time 35 minutes      Signed, Christell Faith, PA-C  04/28/2021, 11:07 AM

## 2021-04-28 NOTE — Progress Notes (Signed)
Occupational Therapy Treatment Patient Details Name: Stephen [name] MRN: [id number] DOB: Sep 09, 1945 Today's Date: 04/28/2021   History of present illness 75 y.o. male with a hx of  pulmonary hypertension, chronic diastolic CHF, CAD s/p L/R heart cath 2018, R heart cath 6/22,, pulmonary fibrosis s/p wedge resection, obesity, COPD on home oxygen, OSA on CPAP, DM2, CKD, renal artery stenosis, who came in 10/18 with chest pain/NSTEMI.   OT comments  Pt seen for OT tx this date to f/u re: safety with ADLs/ADL mobility. Pt agreeable to OOB activity. Requries MIN/MOD A +2 with HOB elevated to come to EOB sitting. Noted to have increased RR with mild mobilization, but spO2 >90% on 2Lnc throughout session (checked sitting and standing, RN present). Pt requires MAX to TOTAL A for seated LB dressing to don socks d/t limited activity tolerance and body habitus including truncal obesity. States his sposue assist at baseline. Pt requires MIN A +2 for STS with RW on initial trial and is able to complete second trial with CGA/MIN A of one person (from elevated bed height of ~6-7"). Pt returned to bed end of session and placed in chair position with alarm set. Pt and spouse educated on difference of therapy services with [place] versus rehab. Pt did perform better today, but will want further participation and activity tolerance demonstrated to confidently change d/c recommendation back to home health. That said, if pt and family are still insistent, he will require hospital bed and BSC at discharge.     Recommendations for follow up therapy are one component of a multi-disciplinary discharge planning process, led by the attending physician.  Recommendations may be updated based on patient status, additional functional criteria and insurance authorization.    Follow Up Recommendations  Skilled nursing-short term rehab (<3 hours/day)    Assistance Recommended at Discharge Frequent or constant Supervision/Assistance   Equipment Recommendations  BSC;Hospital bed    Recommendations for Other Services      Precautions / Restrictions Precautions Precautions: Sternal Restrictions Weight Bearing Restrictions: No Other Position/Activity Restrictions: Superficial thrombophlebitis of the left cephalic vein in the upper arm       Mobility Bed Mobility Overal bed mobility: Needs Assistance Bed Mobility: Rolling     Supine to sit: Min assist;+2 for safety/equipment;HOB elevated Sit to supine: Max assist   General bed mobility comments: increased time, cues for hand placement/use of bed rails, HOB elevated.    Transfers Overall transfer level: Needs assistance Equipment used: Rolling walker (2 wheels) Transfers: Sit to/from Stand Sit to Stand: Min assist;+2 physical assistance;+2 safety/equipment;From elevated surface           General transfer comment: MIN A +2 on first trial from EOB elevated ~6-7", MIN A/CGA of one person on second trial, slow to come to stand and hips still slightly flexed, ed re: safe hand/foot placement and rationale, pt with moderate carryover, appears to benfit more from tactile cues. Cues to squeexe glutes, extend to full stand as tolerable.     Balance Overall balance assessment: Needs assistance Sitting-balance support: Feet supported Sitting balance-Leahy Scale: Fair Sitting balance - Comments: uses R UE for seated support   Standing balance support: Single extremity supported;During functional activity Standing balance-Leahy Scale: Poor Standing balance comment: requires UE support and external support of CGA for standing.                           ADL either performed or assessed with clinical judgement  ADL Overall ADL's : Needs assistance/impaired                     Lower Body Dressing: Sitting/lateral leans;Maximal assistance Lower Body Dressing Details (indicate cue type and reason): to don socks, pt reports he requires spouse  assist at baseline                     Vision Patient Visual Report: No change from baseline     Perception     Praxis      Cognition Arousal/Alertness: Awake/alert Behavior During Therapy: WFL for tasks assessed/performed Overall Cognitive Status: Within Functional Limits for tasks assessed                                 General Comments: oriented, but slow to respond/not very conversive. Does seem to allow spouse to speak for him often. Whether this is d/t SOB is difficult to discern.          Exercises Other Exercises Other Exercises: OT educates pt and spouse re: difference between home health therapy versus rehab, still recommending rehab, but since they are insitent on going home, emphasiss on pt putting in work outside of therapy time to become strong enough to reduce caregiver burden. Pt seems to have moderate understanding. Spouse with good understanding.   Shoulder Instructions       General Comments      Pertinent Vitals/ Pain       Pain Assessment: Faces Faces Pain Scale: Hurts even more Pain Location: LUE Pain Descriptors / Indicators: Sore;Guarding Pain Intervention(s): Limited activity within patient's tolerance;Monitored during session;Repositioned (eleavted on two pillows once back to bed)  Home Living                                          Prior Functioning/Environment              Frequency  Min 2X/week        Progress Toward Goals  OT Goals(current goals can now be found in the care plan section)  Progress towards OT goals: Progressing toward goals  Acute Rehab OT Goals Patient Stated Goal: to go home OT Goal Formulation: With patient/family Time For Goal Achievement: 05/09/21 Potential to Achieve Goals: Micro Discharge plan remains appropriate;Frequency remains appropriate    Co-evaluation                 AM-PAC OT "6 Clicks" Daily Activity     Outcome Measure   Help  from another person eating meals?: A Little Help from another person taking care of personal grooming?: A Little Help from another person toileting, which includes using toliet, bedpan, or urinal?: Total Help from another person bathing (including washing, rinsing, drying)?: A Lot Help from another person to put on and taking off regular upper body clothing?: A Little Help from another person to put on and taking off regular lower body clothing?: A Lot 6 Click Score: 14    End of Session Equipment Utilized During Treatment: Oxygen;Gait belt;Rolling walker (2 wheels)  OT Visit Diagnosis: Other abnormalities of gait and mobility (R26.89);Muscle weakness (generalized) (M62.81);Pain Pain - Right/Left: Left Pain - part of body: Arm   Activity Tolerance Patient limited by pain;Other (comment) (increased RR with very mild activity)   Patient  Left in bed;with call bell/phone within reach;with bed alarm set;with family/visitor present   Nurse Communication Mobility status        Time: 1132-1208 OT Time Calculation (min): 36 min  Charges: OT General Charges $OT Visit: 1 Visit OT Treatments $Self Care/Home Management : 8-22 mins $Therapeutic Activity: 8-22 mins  Gerrianne Scale, MS, OTR/L ascom [phone] 04/28/21, 3:18 PM

## 2021-04-28 NOTE — Progress Notes (Signed)
[employer] pad placed on patient's nasal bridge due to skin breakdown, reddening and blanched skin. CPAP mask placed on patient, tolerating well at this time.

## 2021-04-29 DIAGNOSIS — N189 Chronic kidney disease, unspecified: Secondary | ICD-10-CM

## 2021-04-29 DIAGNOSIS — R531 Weakness: Secondary | ICD-10-CM | POA: Diagnosis not present

## 2021-04-29 DIAGNOSIS — I214 Non-ST elevation (NSTEMI) myocardial infarction: Secondary | ICD-10-CM | POA: Diagnosis not present

## 2021-04-29 DIAGNOSIS — G9341 Metabolic encephalopathy: Secondary | ICD-10-CM | POA: Diagnosis not present

## 2021-04-29 DIAGNOSIS — J9601 Acute respiratory failure with hypoxia: Secondary | ICD-10-CM | POA: Diagnosis not present

## 2021-04-29 DIAGNOSIS — J9621 Acute and chronic respiratory failure with hypoxia: Secondary | ICD-10-CM | POA: Diagnosis not present

## 2021-04-29 DIAGNOSIS — E1122 Type 2 diabetes mellitus with diabetic chronic kidney disease: Secondary | ICD-10-CM

## 2021-04-29 DIAGNOSIS — I5033 Acute on chronic diastolic (congestive) heart failure: Secondary | ICD-10-CM | POA: Diagnosis not present

## 2021-04-29 LAB — BASIC METABOLIC PANEL
Anion gap: 8 (ref 5–15)
BUN: 85 mg/dL — ABNORMAL HIGH (ref 8–23)
CO2: 27 mmol/L (ref 22–32)
Calcium: 8 mg/dL — ABNORMAL LOW (ref 8.9–10.3)
Chloride: 99 mmol/L (ref 98–111)
Creatinine, Ser: 3.45 mg/dL — ABNORMAL HIGH (ref 0.61–1.24)
GFR, Estimated: 18 mL/min — ABNORMAL LOW (ref >60)
Glucose, Bld: 174 mg/dL — ABNORMAL HIGH (ref 70–99)
Potassium: 4 mmol/L (ref 3.5–5.1)
Sodium: 134 mmol/L — ABNORMAL LOW (ref 135–145)

## 2021-04-29 LAB — GLUCOSE, CAPILLARY
Glucose-Capillary: 141 mg/dL — ABNORMAL HIGH (ref 70–99)
Glucose-Capillary: 156 mg/dL — ABNORMAL HIGH (ref 70–99)
Glucose-Capillary: 156 mg/dL — ABNORMAL HIGH (ref 70–99)
Glucose-Capillary: 164 mg/dL — ABNORMAL HIGH (ref 70–99)
Glucose-Capillary: 167 mg/dL — ABNORMAL HIGH (ref 70–99)

## 2021-04-29 MED ORDER — IPRATROPIUM-ALBUTEROL 0.5-2.5 (3) MG/3ML IN SOLN
3.0000 mL | Freq: Two times a day (BID) | RESPIRATORY_TRACT | Status: DC
  Filled 2021-04-29: qty 3

## 2021-04-29 MED ORDER — INSULIN GLARGINE-YFGN 100 UNIT/ML ~~LOC~~ SOLN
35.0000 [IU] | Freq: Every day | SUBCUTANEOUS | Status: DC
Start: 2021-04-29 — End: 2021-04-30
  Administered 2021-04-29: 35 [IU] via SUBCUTANEOUS
  Filled 2021-04-29 (×2): qty 0.35

## 2021-04-29 MED ORDER — VITAMIN B-12 1000 MCG PO TABS
1000.0000 ug | ORAL_TABLET | Freq: Every day | ORAL | Status: DC
  Administered 2021-04-30: 1000 ug via ORAL
  Filled 2021-04-29: qty 1

## 2021-04-29 NOTE — Progress Notes (Signed)
Patient ID: Stephen [name], male   DOB: December 15, 1945, 75 y.o.   MRN: [id number] [facility] PROGRESS NOTE  Stephen [name] LZJ:[id number] DOB: 1945/09/19 DOA: 04/17/2021 PCP: [name], MD  HPI/Subjective: Patient moving his left arm a little bit better.  Still sore.  Patient and wife are determined to go home.  Still very weak.  Initially admitted with acute on chronic hypoxic respiratory failure and acute on chronic diastolic congestive heart failure.  Objective: Vitals:   04/29/21 1204 04/29/21 1532  BP: (!) 137/56 137/62  Pulse: (!) 58 (!) 58  Resp: 18 18  Temp: 97.9 F (36.6 C) 97.9 F (36.6 C)  SpO2: 96% 99%    Intake/Output Summary (Last 24 hours) at 04/29/2021 1540 Last data filed at 04/29/2021 1531 Gross per 24 hour  Intake 980 ml  Output 2675 ml  Net -1695 ml   Filed Weights   04/26/21 0643 04/28/21 0500 04/29/21 0501  Weight: 123.1 kg 124 kg 123.8 kg    ROS: Review of Systems  Respiratory:  Positive for shortness of breath.   Cardiovascular:  Negative for chest pain.  Gastrointestinal:  Negative for abdominal pain.  Musculoskeletal:  Positive for joint pain and myalgias.  Exam: Physical Exam HENT:     Head: Normocephalic.     Mouth/Throat:     Pharynx: No oropharyngeal exudate.  Eyes:     General: Lids are normal.     Conjunctiva/sclera: Conjunctivae normal.     Pupils: Pupils are equal, round, and reactive to light.  Cardiovascular:     Rate and Rhythm: Normal rate and regular rhythm.     Heart sounds: Normal heart sounds, S1 normal and S2 normal.  Pulmonary:     Breath sounds: Examination of the right-lower field reveals decreased breath sounds. Examination of the left-lower field reveals decreased breath sounds. Decreased breath sounds present. No wheezing, rhonchi or rales.  Musculoskeletal:     Right ankle: Swelling present.     Left ankle: Swelling present.     Comments: Better range of motion today with left arm.  Skin:     General: Skin is warm.     Comments: Faded erythema left antecubital fossa  Neurological:     Mental Status: He is alert and oriented to person, place, and time.      Scheduled Meds:  aspirin EC  81 mg Oral Daily   atorvastatin  80 mg Oral QHS   calcitRIOL  0.25 mcg Oral Daily   carvedilol  25 mg Oral BID WC   cloNIDine  0.3 mg Oral TID   clopidogrel  75 mg Oral Daily   dorzolamide-timolol  1 drop Both Eyes BID   doxycycline  100 mg Oral Q12H   enoxaparin (LOVENOX) injection  30 mg Subcutaneous Q24H   ezetimibe  10 mg Oral Daily   fluticasone furoate-vilanterol  1 puff Inhalation Daily   gabapentin  100 mg Oral BID   hydrALAZINE  100 mg Oral TID   insulin aspart  0-15 Units Subcutaneous TID WC   insulin aspart  0-5 Units Subcutaneous QHS   insulin aspart  8 Units Subcutaneous TID WC   insulin glargine-yfgn  45 Units Subcutaneous QHS   ipratropium-albuterol  3 mL Nebulization Q6H   isosorbide mononitrate  120 mg Oral Daily   levothyroxine  25 mcg Oral Weekly   levothyroxine  50 mcg Oral Once per day on Mon Tue Wed Thu Fri Sat   montelukast  10 mg Oral Daily  multivitamin with minerals  1 tablet Oral Daily   pantoprazole  40 mg Oral Daily   polyethylene glycol  17 g Oral Daily   Ensure Max Protein  11 oz Oral BID   sodium chloride flush  3 mL Intravenous Q12H   tamsulosin  0.4 mg Oral QHS   Continuous Infusions:  sodium chloride     cefTRIAXone (ROCEPHIN)  IV 1 g (04/29/21 0825)    Assessment/Plan:  Generalized weakness.  Patient still very limited on what he can do.  Continue working with physical therapy.  Family and patient would like to go home.  Setting up hospital bed, [facility] lift, wheelchair, rolling walker and 3 and 1. Acute metabolic encephalopathy likely secondary to fevers and UTI.  Completed 7 days of Rocephin for E. coli and Serratia growing out of urine culture.  I added doxycycline for erythema left antecubital fossa to cover staph aureus.  Redness left  antecubital fossa improved. NSTEMI treated medically with chronic kidney disease.  Continue Coreg, Imdur, Plavix and atorvastatin. Acute on chronic hypoxic hypercapnic respiratory failure.  Patient initially had respiratory distress requiring BiPAP.  The patient does have his own BiPAP at home.  Patient also has interstitial lung disease and pulmonary artery hypertension.  Patient completed steroids during the hospital course. Acute on chronic diastolic congestive heart failure.  Holding torsemide with elevation in creatinine.  Continue Coreg. Acute kidney disease on chronic kidney disease stage IV.  Creatinine as low as 2.84 on 04/27/2021.  Creatinine increased to 3.45 with diuresis.  Holding diuresis at this point.  Baseline creatinine around 2.66. Type 2 diabetes mellitus with chronic kidney disease stage IV.  On glargine insulin and sliding scale.  Drop down Semglee insulin to 35 units at night with creatinine worsening over the last couple days Hx of sleep apnea has BiPAP at night Morbid obesity with a BMI of 45.42 Thrush.  Completed Diflucan Superficial thrombophlebitis left arm.  Conservative management.  Patient moving his left arm better today than yesterday.  Radial pulse 2+ left hand.  Continue doxycycline. Vitamin B12 deficiency.  IM B12 injections for a few days then oral B12 after that        Code Status:     Code Status Orders  (From admission, onward)           Start     Ordered   04/17/21 2339  Full code  Continuous        04/17/21 2341           Code Status History     Date Active Date Inactive Code Status Order ID Comments User Context   12/06/2020 1037 12/06/2020 1624 Full Code [id number]  Wellington Hampshire, MD Inpatient   08/19/2020 1229 08/19/2020 1849 Full Code [id number]  Minna Merritts, MD Inpatient   09/20/2018 1114 09/22/2018 2127 Full Code [id number]  Bettey Costa, MD ED   01/16/2017 0035 01/18/2017 1702 Full Code [id number]  Lance Coon, MD ED   01/11/2017  1654 01/13/2017 1736 Full Code [id number]  Demetrios Loll, MD Inpatient   01/11/2017 1341 01/11/2017 1654 Full Code [id number]  Wellington Hampshire, MD Inpatient   03/20/2012 2310 03/21/2012 1350 Full Code [id number]  Clayton Bibles, [place] [facility]      Family Communication: Spoke with wife at the bedside Disposition Plan: Status is: Inpatient  Consultants: Cardiology  Time spent: 27 minutes  [place]

## 2021-04-29 NOTE — Progress Notes (Signed)
Physical Therapy Treatment Patient Details Name: Stephen [name] MRN: [id number] DOB: [birth year] Today's Date: 04/29/2021   History of Present Illness 75 y.o. male with a hx of  pulmonary hypertension, chronic diastolic CHF, CAD s/p L/R heart cath 2018, R heart cath 6/22,, pulmonary fibrosis s/p wedge resection, obesity, COPD on home oxygen, OSA on CPAP, DM2, CKD, renal artery stenosis, who came in 10/18 with chest pain/NSTEMI.    PT Comments    Pt seen for PT tx with wife present for session. Session focused on hands on caregiver training as wife is eager to take pt home. Session focused on wife assisting pt with repositioning in bed & rolling to R. Pt requires max assist overall from wife & fatigues quickly, even requiring breaks while  scooting over in bed. Pt is also limited by L wrist pain. Pt doesn't tolerate lying supine 2/2 SOB; lowest SpO2 88% on 4L/min with pt able to recover with cuing for pursed lip breathing & HOB elevated. Pt & wife voice comfort with education but would benefit from further practice. This PT continues to recommend STR upon d/c to maximize independence with mobility & decrease caregiver burden.    Recommendations for follow up therapy are one component of a multi-disciplinary discharge planning process, led by the attending physician.  Recommendations may be updated based on patient status, additional functional criteria and insurance authorization.  Follow Up Recommendations  Skilled nursing-short term rehab (<3 hours/day)     Assistance Recommended at Discharge Frequent or constant Supervision/Assistance  Equipment Recommendations  Rolling walker (2 wheels);3in1 (PT);Wheelchair (measurements PT);Hospital bed (manual hoyer lift)    Recommendations for Other Services       Precautions / Restrictions Precautions Precautions: Sternal Restrictions Weight Bearing Restrictions: No Other Position/Activity Restrictions: Superficial thrombophlebitis of the left  cephalic vein in the upper arm     Mobility  Bed Mobility   Bed Mobility: Rolling Rolling: Max assist              Transfers                        Ambulation/Gait                 Stairs             Wheelchair Mobility    Modified Rankin (Stroke Patients Only)       Balance                                            Cognition Arousal/Alertness: Awake/alert Behavior During Therapy: WFL for tasks assessed/performed;Flat affect Overall Cognitive Status: Within Functional Limits for tasks assessed                                 General Comments: requires extra time to follow commands        Exercises      General Comments        Pertinent Vitals/Pain Pain Assessment: Faces Faces Pain Scale: Hurts whole lot Pain Location: L wrist & generalized pain Pain Descriptors / Indicators: Sore;Guarding Pain Intervention(s): Monitored during session;Limited activity within patient's tolerance;Repositioned    Home Living  Prior Function            PT Goals (current goals can now be found in the care plan section) Acute Rehab PT Goals Patient Stated Goal: go home PT Goal Formulation: With patient Time For Goal Achievement: 05/10/21 Progress towards PT goals: Progressing toward goals    Frequency    Min 2X/week      PT Plan Current plan remains appropriate    Co-evaluation              AM-PAC PT "6 Clicks" Mobility   Outcome Measure  Help needed turning from your back to your side while in a flat bed without using bedrails?: A Lot Help needed moving from lying on your back to sitting on the side of a flat bed without using bedrails?: Total Help needed moving to and from a bed to a chair (including a wheelchair)?: Total Help needed standing up from a chair using your arms (e.g., wheelchair or bedside chair)?: Total Help needed to walk in hospital  room?: Total Help needed climbing 3-5 steps with a railing? : Total 6 Click Score: 7    End of Session Equipment Utilized During Treatment: Oxygen Activity Tolerance: Patient limited by pain;Patient limited by fatigue Patient left: with bed alarm set;with call bell/phone within reach;with family/visitor present;in bed   PT Visit Diagnosis: Unsteadiness on feet (R26.81);Other abnormalities of gait and mobility (R26.89);Muscle weakness (generalized) (M62.81);Difficulty in walking, not elsewhere classified (R26.2)     Time: 2094-7096 PT Time Calculation (min) (ACUTE ONLY): 27 min  Charges:  $Therapeutic Activity: 23-37 mins                     Lavone Nian, PT, DPT 04/29/21, 12:33 PM    Waunita Schooner 04/29/2021, 12:29 PM

## 2021-04-29 NOTE — Progress Notes (Signed)
Progress Note  Patient Name: Stephen [name] Date of Encounter: 04/29/2021  [facility] Cardiologist: Ida Rogue, MD   Subjective   Less SOB at rest today. No chest pain. Worked with PT today, requires max assist with recommendation for short term rehab. Renal function continues to trend up. Currently getting torsemide 40 mg daily.  Inpatient Medications    Scheduled Meds:  aspirin EC  81 mg Oral Daily   atorvastatin  80 mg Oral QHS   calcitRIOL  0.25 mcg Oral Daily   carvedilol  25 mg Oral BID WC   cloNIDine  0.3 mg Oral TID   clopidogrel  75 mg Oral Daily   dorzolamide-timolol  1 drop Both Eyes BID   doxycycline  100 mg Oral Q12H   enoxaparin (LOVENOX) injection  30 mg Subcutaneous Q24H   ezetimibe  10 mg Oral Daily   fluticasone furoate-vilanterol  1 puff Inhalation Daily   gabapentin  100 mg Oral BID   hydrALAZINE  100 mg Oral TID   insulin aspart  0-15 Units Subcutaneous TID WC   insulin aspart  0-5 Units Subcutaneous QHS   insulin aspart  8 Units Subcutaneous TID WC   insulin glargine-yfgn  45 Units Subcutaneous QHS   ipratropium-albuterol  3 mL Nebulization Q6H   isosorbide mononitrate  120 mg Oral Daily   levothyroxine  25 mcg Oral Weekly   levothyroxine  50 mcg Oral Once per day on Mon Tue Wed Thu Fri Sat   montelukast  10 mg Oral Daily   multivitamin with minerals  1 tablet Oral Daily   pantoprazole  40 mg Oral Daily   polyethylene glycol  17 g Oral Daily   Ensure Max Protein  11 oz Oral BID   sodium chloride flush  3 mL Intravenous Q12H   tamsulosin  0.4 mg Oral QHS   Continuous Infusions:  sodium chloride     cefTRIAXone (ROCEPHIN)  IV 1 g (04/29/21 0825)   PRN Meds: sodium chloride, acetaminophen, albuterol, docusate sodium, fluticasone, guaiFENesin-dextromethorphan, hydrALAZINE, HYDROcodone-acetaminophen, morphine injection, nitroGLYCERIN, ondansetron (ZOFRAN) IV, sodium chloride, sodium chloride flush   Vital Signs    Vitals:   04/29/21  0752 04/29/21 0824 04/29/21 1204 04/29/21 1532  BP: (!) 141/71  (!) 137/56 137/62  Pulse: 60  (!) 58 (!) 58  Resp: 18  18 18   Temp: (!) 97 F (36.1 C)  97.9 F (36.6 C) 97.9 F (36.6 C)  TempSrc:      SpO2: 97% 98% 96% 99%  Weight:      Height:        Intake/Output Summary (Last 24 hours) at 04/29/2021 1537 Last data filed at 04/29/2021 1531 Gross per 24 hour  Intake 980 ml  Output 2675 ml  Net -1695 ml    Last 3 Weights 04/29/2021 04/28/2021 04/26/2021  Weight (lbs) 272 lb 14.9 oz 273 lb 5.9 oz 271 lb 6.2 oz  Weight (kg) 123.8 kg 124 kg 123.1 kg  Some encounter information is confidential and restricted. Go to Review Flowsheets activity to see all data.      Telemetry    SR - Personally Reviewed  ECG    No new tracing reviewed - Personally Reviewed  Physical Exam   GEN: No acute distress.   Neck: JVD difficult to assess secondary to body habitus. Cardiac: RRR, no murmurs, rubs, or gallops.  Respiratory: Clear to auscultation bilaterally. Normal respiratory effort.  GI: Soft, nontender, non-distended  MS: 2+ edema; No deformity. Neuro:  Nonfocal  Psych: Normal affect   Labs    High Sensitivity Troponin:   Recent Labs  Lab 04/05/21 2016 04/17/21 2101 04/18/21 0505 04/18/21 0658 04/18/21 1240  TROPONINIHS 107* 74* 2,129* 1,997* 1,842*      Chemistry Recent Labs  Lab 04/22/21 2058 04/23/21 0346 04/27/21 0446 04/28/21 1110 04/29/21 0839  NA 136   < > 132* 133* 134*  K 4.6   < > 4.6 4.2 4.0  CL 99   < > 98 98 99  CO2 28   < > 25 26 27   GLUCOSE 293*   < > 213* 127* 174*  BUN 78*   < > 86* 81* 85*  CREATININE 3.27*   < > 2.84* 3.24* 3.45*  CALCIUM 8.5*   < > 8.2* 8.2* 8.0*  MG 2.2  --   --   --   --   PROT 6.1*  --   --   --   --   ALBUMIN 2.5*  --   --   --   --   AST 20  --   --   --   --   ALT 26  --   --   --   --   ALKPHOS 51  --   --   --   --   BILITOT 0.6  --   --   --   --   GFRNONAA 19*   < > 23* 19* 18*  ANIONGAP 9   < > 9 9 8     < > = values in this interval not displayed.     Lipids No results for input(s): CHOL, TRIG, HDL, LABVLDL, LDLCALC, CHOLHDL in the last 168 hours.  Hematology Recent Labs  Lab 04/25/21 0741 04/26/21 0436 04/27/21 0446  WBC 10.3 10.3 10.9*  RBC 3.70* 3.49* 3.47*  HGB 10.4* 9.7* 9.8*  HCT 32.5* 30.4* 29.8*  MCV 87.8 87.1 85.9  MCH 28.1 27.8 28.2  MCHC 32.0 31.9 32.9  RDW 15.7* 15.8* 15.7*  PLT 174 184 214    Thyroid  Recent Labs  Lab 04/23/21 0346  TSH 1.361  FREET4 0.82     BNPNo results for input(s): BNP, PROBNP in the last 168 hours.  DDimer No results for input(s): DDIMER in the last 168 hours.   Radiology    US Venous Img Upper Uni Left (DVT)  Result Date: 04/27/2021 IMPRESSION: 1. No evidence of DVT within the left upper extremity. 2. Superficial thrombophlebitis of the left cephalic vein in the upper arm. Electronically Signed   By: Aletta Edouard M.D.   On: 04/27/2021 10:27    Cardiac Studies   Echo 04/18/2021 1. Left ventricular ejection fraction, by estimation, is 60 to 65%. The  left ventricle has normal function. Left ventricular endocardial border  not optimally defined to evaluate regional wall motion. The left  ventricular internal cavity size was mildly  dilated. There is mild left ventricular hypertrophy. Left ventricular  diastolic parameters are consistent with Grade I diastolic dysfunction  (impaired relaxation). Elevated left atrial pressure.   2. Right ventricular systolic function is normal. The right ventricular  size is not well visualized.   3. The mitral valve is abnormal. Trivial mitral valve regurgitation. Mild  mitral stenosis. The mean mitral valve gradient is 5.0 mmHg.   4. Tricuspid valve regurgitation not well assessed.   5. The aortic valve has an indeterminant number of cusps. There is mild  calcification of the aortic valve. There is moderate  thickening of the  aortic valve. Aortic valve regurgitation is trivial. Mild to  moderate  aortic valve sclerosis/calcification is  present, without any evidence of aortic stenosis.    Patient Profile     75 y.o. male with history of HFpEF, COPD, CAD, and obesity with deconditioning presenting with chest pain and shortness of breath who is being seen for HFpEF with admission complicated by acute on CKD.   Assessment & Plan    1.  HFpEF: -Good UOP with resumed torsemide 40 mg daily, continue if renal function stabilizes  -Daily weights -Strict I/O   2.  Elevated troponins, NSTEMI: -S/p Heparin x 48 hours -Denies chest pain   -Imdur 120 mg daily -Aspirin, Plavix, Coreg, Lipitor -Cath deferred with acute on CKD   3.  Hypertension: -BP reasonably controlled  -Continue Coreg, clonidine, Imdur, hydralazine  4.  COPD, sleep apnea: -Management as per medicine -Needs BiPAP for sleep and naps  5. Acute on CKD stage IV: -Renal function slightly worse today -Monitor with diuresis   His biggest limiting factor keeping him admitted at this time is significant weakness and deconditioning. PT recommends short term rehab. Ongoing noncardiac issues per IM    Greater than 50% was spent in counseling and coordination of care with patient Total encounter time 35 minutes      Signed, Christell Faith, PA-C  04/29/2021, 3:37 PM

## 2021-04-29 NOTE — TOC Progression Note (Addendum)
Transition of Care [facility]) - Progression Note    Patient Details  Name: Stephen [name] MRN: [id number] Date of Birth: [birth year]  Transition of Care [facility]) CM/SW [place], LCSW Phone Number: 04/29/2021, 2:35 PM  Clinical Narrative:   Checked with Suanne Marker with [employer] to see if anyone has spoken with patient's wife about costs of DME. Per Suanne Marker, they cannot talk to family until DME orders are in. Asked MD for orders and asked Rhonda with [employer] to have someone follow up with wife today about costs if possible (of walker, 3 in 1, hospital bed, hoyer lift, wheelchair). Informed Rhonda patient is a possible weekend DC.   Per TOC notes patient already has home o2 through [place].  Expected Discharge Plan: [name]: Continued Medical Work up  Expected Discharge Plan and Services Expected Discharge Plan: [place] In-house Referral: Clinical Social Work   Post Acute Care Choice: [place] arrangements for the past 2 months: Single Family Home                                       Social Determinants of Health (SDOH) Interventions    Readmission Risk Interventions Readmission Risk Prevention Plan 09/20/2018  Transportation Screening Complete  PCP or Specialist Appt within 3-5 Days Complete  HRI or [place] Patient refused  Social Work Consult for [place] Planning/Counseling Patient refused  Palliative Care Screening Not Applicable  Medication Review Press photographer) Complete  Some recent data might be hidden

## 2021-04-29 NOTE — Care Management Important Message (Signed)
Important Message  Patient Details  Name: Stephen [name] MRN: [id number] Date of Birth: Oct 11, 1945   Medicare Important Message Given:  Yes     Dannette Barbara 04/29/2021, 12:37 PM

## 2021-04-30 DIAGNOSIS — I214 Non-ST elevation (NSTEMI) myocardial infarction: Secondary | ICD-10-CM | POA: Diagnosis not present

## 2021-04-30 DIAGNOSIS — R531 Weakness: Secondary | ICD-10-CM | POA: Diagnosis not present

## 2021-04-30 DIAGNOSIS — E538 Deficiency of other specified B group vitamins: Secondary | ICD-10-CM

## 2021-04-30 DIAGNOSIS — J9601 Acute respiratory failure with hypoxia: Secondary | ICD-10-CM | POA: Diagnosis not present

## 2021-04-30 DIAGNOSIS — G9341 Metabolic encephalopathy: Secondary | ICD-10-CM | POA: Diagnosis not present

## 2021-04-30 LAB — CULTURE, BLOOD (ROUTINE X 2)
Culture: NO GROWTH
Culture: NO GROWTH
Special Requests: ADEQUATE
Special Requests: ADEQUATE

## 2021-04-30 LAB — BASIC METABOLIC PANEL
Anion gap: 6 (ref 5–15)
BUN: 84 mg/dL — ABNORMAL HIGH (ref 8–23)
CO2: 28 mmol/L (ref 22–32)
Calcium: 8 mg/dL — ABNORMAL LOW (ref 8.9–10.3)
Chloride: 100 mmol/L (ref 98–111)
Creatinine, Ser: 3.03 mg/dL — ABNORMAL HIGH (ref 0.61–1.24)
GFR, Estimated: 21 mL/min — ABNORMAL LOW (ref >60)
Glucose, Bld: 150 mg/dL — ABNORMAL HIGH (ref 70–99)
Potassium: 3.8 mmol/L (ref 3.5–5.1)
Sodium: 134 mmol/L — ABNORMAL LOW (ref 135–145)

## 2021-04-30 LAB — GLUCOSE, CAPILLARY
Glucose-Capillary: 113 mg/dL — ABNORMAL HIGH (ref 70–99)
Glucose-Capillary: 129 mg/dL — ABNORMAL HIGH (ref 70–99)
Glucose-Capillary: 150 mg/dL — ABNORMAL HIGH (ref 70–99)
Glucose-Capillary: 222 mg/dL — ABNORMAL HIGH (ref 70–99)

## 2021-04-30 MED ORDER — CYANOCOBALAMIN 1000 MCG PO TABS: 1000.0000 ug | ORAL_TABLET | Freq: Every day | ORAL | 0 refills | Status: AC

## 2021-04-30 MED ORDER — ISOSORBIDE MONONITRATE ER 120 MG PO TB24: 120.0000 mg | ORAL_TABLET | Freq: Every day | ORAL | 0 refills | Status: AC

## 2021-04-30 MED ORDER — IPRATROPIUM-ALBUTEROL 0.5-2.5 (3) MG/3ML IN SOLN: 3.0000 mL | Freq: Four times a day (QID) | RESPIRATORY_TRACT | Status: DC | PRN

## 2021-04-30 MED ORDER — HYDRALAZINE HCL 100 MG PO TABS: 100.0000 mg | ORAL_TABLET | Freq: Three times a day (TID) | ORAL | 0 refills | Status: AC

## 2021-04-30 MED ORDER — INSULIN DETEMIR 100 UNIT/ML FLEXPEN: 30.0000 [IU] | PEN_INJECTOR | Freq: Every day | SUBCUTANEOUS | 0 refills | Status: AC

## 2021-04-30 MED ORDER — SALINE SPRAY 0.65 % NA SOLN: 1.0000 | NASAL | 0 refills | Status: AC | PRN

## 2021-04-30 MED ORDER — GABAPENTIN 100 MG PO CAPS: 100.0000 mg | ORAL_CAPSULE | Freq: Two times a day (BID) | ORAL | 0 refills | Status: AC

## 2021-04-30 MED ORDER — INSULIN ASPART 100 UNIT/ML FLEXPEN: 8.0000 [IU] | PEN_INJECTOR | Freq: Three times a day (TID) | SUBCUTANEOUS | 11 refills | Status: AC

## 2021-04-30 MED ORDER — POLYETHYLENE GLYCOL 3350 17 G PO PACK: 17.0000 g | PACK | Freq: Every day | ORAL | 0 refills | Status: AC | PRN

## 2021-04-30 MED ORDER — DOXYCYCLINE HYCLATE 100 MG PO TABS: 100.0000 mg | ORAL_TABLET | Freq: Two times a day (BID) | ORAL | 0 refills | Status: AC

## 2021-04-30 MED ORDER — CARVEDILOL 25 MG PO TABS: 25.0000 mg | ORAL_TABLET | Freq: Two times a day (BID) | ORAL | 0 refills | Status: AC

## 2021-04-30 MED ORDER — INSULIN PEN NEEDLE 34G X 3.5 MM MISC: 1.0000 | Freq: Three times a day (TID) | 0 refills | Status: AC

## 2021-04-30 MED ORDER — HYDROCODONE-ACETAMINOPHEN 5-325 MG PO TABS: 1.0000 | ORAL_TABLET | Freq: Three times a day (TID) | ORAL | 0 refills | Status: AC | PRN

## 2021-04-30 NOTE — TOC Progression Note (Addendum)
Transition of Care [facility]) - Progression Note    Patient Details  Name: ROBBERT LANGLINAIS MRN: [id number] Date of Birth: 04/19/1946  Transition of Care [facility]) CM/SW [place], LCSW Phone Number: 04/30/2021, 9:30 AM  Clinical Narrative:   Call to [facility] with [place]. She stated someone from [place] will follow up with the patient's wife today about DME costs and what she would like to order. Updated wheelchair order needed. Notified MD.   Expected Discharge Plan: [place] Barriers to Discharge: Continued Medical Work up  Expected Discharge Plan and Services Expected Discharge Plan: [place] In-house Referral: Clinical Social Work   Post Acute Care Choice: [place] arrangements for the past 2 months: Single Family Home                                       Social Determinants of Health (SDOH) Interventions    Readmission Risk Interventions Readmission Risk Prevention Plan 09/20/2018  Transportation Screening Complete  PCP or Specialist Appt within 3-5 Days Complete  HRI or [place] Patient refused  Social Work Consult for [place] Planning/Counseling Patient refused  Palliative Care Screening Not Applicable  Medication Review Press photographer) Complete  Some recent data might be hidden

## 2021-04-30 NOTE — Progress Notes (Signed)
[facility]) Hospital Liaison Note  Notified by [facility] manager of patient/family request for [facility] Palliative services at home after discharge.  [facility] hospital liaison will follow patient for discharge disposition.  Please call with any hospice or outpatient palliative care related questions.  Thank you for the opportunity to participate in this patient's care.  Bobbie "Loren Racer, [place], [place] [phone]

## 2021-04-30 NOTE — Discharge Summary (Signed)
[facility] [place] NAME: Stephen [name]    MR#:  [id number]  DATE OF BIRTH:  1946-06-15  DATE OF ADMISSION:  04/17/2021 ADMITTING PHYSICIAN: Athena Masse, MD  DATE OF DISCHARGE: 04/30/2021  PRIMARY CARE PHYSICIAN: [name], MD    ADMISSION DIAGNOSIS:  Shortness of breath [R06.02] SOB (shortness of breath) [R06.02] Atypical chest pain [R07.89] Acute on chronic respiratory failure with hypoxemia (HCC) [J96.21]  DISCHARGE DIAGNOSIS:  Active Problems:   Type 2 diabetes mellitus with renal complication (HCC)   OSA (obstructive sleep apnea)   CAD (coronary artery disease)   Essential hypertension, benign   Obesity, Class III, BMI 40-49.9 (morbid obesity) ([place])   Acute on chronic heart failure with preserved ejection fraction (HFpEF) (HCC)   Acute kidney injury superimposed on CKD ([place])   Pulmonary hypertension, primary (HCC)   Acute on chronic respiratory failure with hypoxemia (HCC)   CKD (chronic kidney disease) stage 4, GFR 15-29 ml/min (HCC)   ILD (interstitial lung disease) (HCC)   COPD with acute exacerbation (HCC)   Acute metabolic encephalopathy   Generalized weakness   Acute respiratory failure with hypoxia and hypercapnia ([place])   SECONDARY DIAGNOSIS:   Past Medical History:  Diagnosis Date   Adrenal gland anomaly    Arthritis    CAD (coronary artery disease)    a. 04/2006 MI and PCI/stenting to mLCx & mRCA; b. 07/2009 Cath: patent LCX/RCA stents;  c. 12/2016 NSTEMI/Cath: LM 30, LAD 30p, 62m, 30d, D1/2/3 min irregs, LCX 171m ISR, OM1 min irregs, RCA 20p, 10 ISR, 73m, 50d, RPDA min irregs, RPAV 40-->Med Rx; d. 12/2017 MV: fixed lateral wall scar, mild anterior/anterior septal ischemia. EF 30-44%.   CHF (congestive heart failure) (HCC)    CKD (chronic kidney disease), stage III (HCC)    Colon polyps    DM2 (diabetes mellitus, type 2) (HCC)    insulin requiring   Dyslipidemia    GERD (gastroesophageal reflux  disease)    History of echocardiogram    a. TTE 12/2016: EF 50-55%, mod concentric LVH, images inadequate for wall motion assessment, not technically sufficienct to allow for LV dias fxn, calcified mitral annulus    HTN (hypertension)    Kidney stones    Left Renal artery stenosis ([place])    a. 07/2013 s/p PTA/stenting ([place]); b. 07/2017 Renal Duplex: No significant RAS.   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   Recurrent UTI    Rotator cuff disorder    right   Skin cancer    head   Thyroid disorder    Venous insufficiency of both lower extremities    a. s/p laser treatment.    HOSPITAL COURSE:   NSTEMI.  Treated medically with chronic kidney disease.  Patient on increased dose of Coreg Imdur.  Also on Plavix and atorvastatin.  LDL 69. Generalized weakness.  Patient still very limited on what he can do.  Family did not want to go out to rehab.  Hospital bed, [facility] lift, wheelchair, rolling walker and 3 and 1 DME orders prescribed.  Home health physical therapy, Occupational Therapy and nursing prescribed Acute metabolic encephalopathy likely secondary to fevers and UTI during the hospital course.  Patient completed 7 days of Rocephin for E. coli and Serratia growing out of the urine culture.  I added doxycycline for  erythema of the left antecubital fossa to cover staph aureus.  Redness of the left antecubital fossa improved we will give a prescription for doxycycline to complete the course upon discharge home. Acute on chronic hypoxic hypercapnic respiratory failure.  Patient initially had respiratory distress requiring BiPAP upon coming into the hospital.  The patient has his own BiPAP at home.  The patient is chronically on 6 L of oxygen at home but taper down to 4 L of oxygen here.  Patient also has interstitial lung disease and pulmonary artery  hypertension.  Patient has completed a steroid course during the hospital course. Acute on chronic diastolic congestive heart failure.  Continue Coreg.  Restarting torsemide as outpatient.  CHF clinic. Acute kidney injury on chronic kidney stage IV.  Creatinine as low as 2.84 on 04/27/2021.  Baseline creatinine around 2.66.  Creatinine went up to 3.45 on 04/29/2021.  Creatinine 3.03 upon discharge.  Recommend a BMP in follow-up appointment. Type 2 diabetes mellitus with chronic kidney disease stage IV.  Will prescribe Levemir insulin 30 units at night at home and short acting insulin prior to meals. History of sleep apnea on BiPAP at night Morbid obesity with a BMI of 45.01 Thrush.  Completed Diflucan during the hospital course Superficial thrombophlebitis left arm.  Conservative management.  Received ice packs while here.  Patient able to move his left arm a little bit better on a daily basis.  Radial pulses 2+ left hand.  Continue doxycycline for left antecubital cellulitis.  Can use heat pads at home. Vitamin B12 deficiency.  Vitamin B12 148.  The patient received 3 doses of IM B12 injections and can go on oral B12 supplementation upon discharge. Palliative care to follow as outpatient.  Difficult balance with CHF, acute on chronic renal insufficiency and acute on chronic hypoxic hypercarbic respiratory failure.  DISCHARGE CONDITIONS:   Fair  CONSULTS OBTAINED:  Treatment Team:  Nelva Bush, MD  DRUG ALLERGIES:   Allergies  Allergen Reactions   Calcium Channel Blockers     Would avoid if possible due to h/o peripheral edema    DISCHARGE MEDICATIONS:   Allergies as of 04/30/2021       Reactions   Calcium Channel Blockers    Would avoid if possible due to h/o peripheral edema        Medication List     STOP taking these medications    diphenhydrAMINE 50 MG tablet Commonly known as: BENADRYL   Droplet Insulin Syringe 30G X 5/16" 1 ML Misc Generic drug: Insulin  Syringe-Needle U-100   Ex-Lax 15 MG Chew Generic drug: Sennosides   furosemide 80 MG tablet Commonly known as: LASIX   insulin NPH-regular Human (70-30) 100 UNIT/ML injection   potassium chloride 10 MEQ tablet Commonly known as: KLOR-CON   primidone 50 MG tablet Commonly known as: MYSOLINE   sildenafil 20 MG tablet Commonly known as: REVATIO       TAKE these medications    Accu-Chek Aviva Plus test strip Generic drug: glucose blood USE AS INSTRUCTED TO CHECK BLOOD SUGAR 3 TIMES DAILY OR AS NEEDED   Accu-Chek Aviva Plus w/Device Kit Use to check blood sugar three times daily and as needed.  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   acetaminophen 500 MG tablet Commonly known as: TYLENOL Take 1,000 mg by mouth every 6 (six) hours as needed.  albuterol 108 (90 Base) MCG/ACT inhaler Commonly known as: VENTOLIN HFA INHALE 2 PUFFS INTO THE LUNGS EVERY 6 HOURS AS NEEDED FOR WHEEZING OR SHORTNESS OF BREATH. DISPENSE 3 INHALERS   aspirin EC 81 MG tablet Take 1 tablet (81 mg total) by mouth daily. Swallow whole.   atorvastatin 80 MG tablet Commonly known as: LIPITOR TAKE 1 TABLET AT BEDTIME   B-D SINGLE USE SWABS REGULAR Pads USE TO CLEANS AREA PRIOR TO CHECKING BLOOD SUGAR THREE TIMES DAILY AND AS NEEDED   calcitRIOL 0.25 MCG capsule Commonly known as: ROCALTROL Take 0.25 mcg by mouth daily.   carvedilol 25 MG tablet Commonly known as: COREG Take 1 tablet (25 mg total) by mouth 2 (two) times daily with a meal. What changed:  medication strength See the new instructions.   cloNIDine 0.3 MG tablet Commonly known as: CATAPRES TAKE 1 TABLET THREE TIMES DAILY   clopidogrel 75 MG tablet Commonly known as: PLAVIX TAKE 1 TABLET EVERY DAY   cyanocobalamin 1000 MCG tablet Take 1 tablet (1,000 mcg total) by mouth  daily. Start taking on: May 01, 2021   dorzolamide-timolol 22.3-6.8 MG/ML ophthalmic solution Commonly known as: COSOPT Place 1 drop into both eyes in the morning and at bedtime.   doxycycline 100 MG tablet Commonly known as: VIBRA-TABS Take 1 tablet (100 mg total) by mouth every 12 (twelve) hours for 6 days.   ezetimibe 10 MG tablet Commonly known as: ZETIA TAKE 1 TABLET EVERY DAY   fluticasone 50 MCG/ACT nasal spray Commonly known as: FLONASE Place 2 sprays into both nostrils daily.   gabapentin 100 MG capsule Commonly known as: NEURONTIN Take 1 capsule (100 mg total) by mouth 2 (two) times daily. What changed:  medication strength how much to take how to take this when to take this additional instructions   hydrALAZINE 100 MG tablet Commonly known as: APRESOLINE Take 1 tablet (100 mg total) by mouth 3 (three) times daily. What changed:  medication strength how much to take   HYDROcodone-acetaminophen 5-325 MG tablet Commonly known as: NORCO/VICODIN Take 1 tablet by mouth every 8 (eight) hours as needed for up to 3 days for severe pain. What changed:  when to take this reasons to take this   insulin aspart 100 UNIT/ML FlexPen Commonly known as: NOVOLOG Inject 8 Units into the skin 3 (three) times daily with meals. (Okay to substitute generic or change to another short acting insulin that is covered)   insulin detemir 100 UNIT/ML FlexPen Commonly known as: LEVEMIR Inject 30 Units into the skin at bedtime.   Insulin Pen Needle 34G X 3.5 MM Misc 1 Dose by Does not apply route 3 (three) times daily.   isosorbide mononitrate 120 MG 24 hr tablet Commonly known as: IMDUR Take 1 tablet (120 mg total) by mouth daily. Start taking on: May 01, 2021 What changed:  medication strength See the new instructions.   levothyroxine 25 MCG tablet Commonly known as: SYNTHROID take 2 of the 25 mcg tablets Monday through Saturday with 1 tablet on Sunday.  13  pills/week.   montelukast 10 MG tablet Commonly known as: SINGULAIR Take 1 tablet by mouth once daily   nitroGLYCERIN 0.4 MG SL tablet Commonly known as: NITROSTAT Place 1 tablet (0.4 mg total) under the tongue every 5 (five) minutes as needed. May repeat x3   omeprazole 20 MG capsule Commonly known as: PRILOSEC Take 20 mg by mouth daily before breakfast.   polyethylene glycol 17 g packet Commonly known as:  MIRALAX / GLYCOLAX Take 17 g by mouth daily as needed for moderate constipation.   sodium chloride 0.65 % Soln nasal spray Commonly known as: OCEAN Place 1 spray into both nostrils as needed for congestion.   tamsulosin 0.4 MG Caps capsule Commonly known as: FLOMAX TAKE 1 CAPSULE AT BEDTIME   torsemide 20 MG tablet Commonly known as: DEMADEX Take 2 tablets (40 mg) by mouth once daily   Trelegy Ellipta 200-62.5-25 MCG/ACT Aepb Generic drug: Fluticasone-Umeclidin-Vilant Inhale 1 puff into the lungs daily.               Durable Medical Equipment  (From admission, onward)           Start     Ordered   04/30/21 0948  For home use only DME standard manual wheelchair with seat cushion  Once       Comments: Patient suffers from weakness, arm pain, chf and pulmonary hypertension which impairs their ability to perform daily activities like walking in the home.  A walker will not resolve issue with performing activities of daily living. A wheelchair will allow patient to safely perform daily activities. Patient can safely propel the wheelchair in the home or has a caregiver who can provide assistance. Length of need lifetime. Accessories: elevating leg rests (ELRs), wheel locks, extensions and anti-tippers.   04/30/21 0948   04/29/21 1438  For home use only DME Hospital bed  Once       Question Answer Comment  Length of Need Lifetime   The above medical condition requires: Patient requires the ability to reposition frequently   Head must be elevated greater than: 45  degrees   Bed type Semi-electric   Hoyer Lift Yes   Support Surface: Gel Overlay      04/29/21 1438   04/29/21 1437  For home use only DME Walker rolling  Once       Question Answer Comment  Walker: With Inez   Patient needs a walker to treat with the following condition Unsteady gait when walking      04/29/21 1438   04/29/21 1437  For home use only DME 3 n 1  Once        04/29/21 1438   04/21/21 1513  For home use only DME 3 n 1  Once        04/21/21 1512             DISCHARGE INSTRUCTIONS:  Follow-up PMD 5 days Follow-up cardiology 1 week CHF clinic follow-up Palliative care as outpatient follow-up  If you experience worsening of your admission symptoms, develop shortness of breath, life threatening emergency, suicidal or homicidal thoughts you must seek medical attention immediately by calling 911 or calling your MD immediately  if symptoms less severe.  You Must read complete instructions/literature along with all the possible adverse reactions/side effects for all the Medicines you take and that have been prescribed to you. Take any new Medicines after you have completely understood and accept all the possible adverse reactions/side effects.   Please note  You were cared for by a hospitalist during your hospital stay. If you have any questions about your discharge medications or the care you received while you were in the hospital after you are discharged, you can call the unit and asked to speak with the hospitalist on call if the hospitalist that took care of you is not available. Once you are discharged, your primary care physician will handle any further medical  issues. Please note that NO REFILLS for any discharge medications will be authorized once you are discharged, as it is imperative that you return to your primary care physician (or establish a relationship with a primary care physician if you do not have one) for your aftercare needs so that they can  reassess your need for medications and monitor your lab values.    Today   CHIEF COMPLAINT:   Chief Complaint  Patient presents with   Shortness of Breath   Chest Pain    HISTORY OF PRESENT ILLNESS:  Stephen [name]  is a 75 y.o. male came in with shortness of breath and chest pain and found to have NSTEMI   VITAL SIGNS:  Blood pressure (!) 145/53, pulse 63, temperature 98.4 F (36.9 C), temperature source Oral, resp. rate 18, height $RemoveBe'5\' 5"'wNVtfBgZj$  (1.651 m), weight 122.7 kg, SpO2 95 %.  I/O:   Intake/Output Summary (Last 24 hours) at 04/30/2021 1649 Last data filed at 04/30/2021 1521 Gross per 24 hour  Intake 240 ml  Output 2100 ml  Net -1860 ml    PHYSICAL EXAMINATION:  GENERAL:  75 y.o.-year-old patient lying in the bed with no acute distress.  EYES: Pupils equal, round, reactive to light and accommodation. No scleral icterus.  HEENT: Head atraumatic, normocephalic. Oropharynx and nasopharynx clear.  LUNGS: Normal breath sounds bilaterally, no wheezing, rales,rhonchi or crepitation. No use of accessory muscles of respiration.  CARDIOVASCULAR: S1, S2 normal. No murmurs, rubs, or gallops.  ABDOMEN: Soft, non-tender, non-distended.   EXTREMITIES: 2+ pedal edema.  NEUROLOGIC: Cranial nerves II through XII are intact. Muscle strength 5/5 in all extremities. Sensation intact. Gait not checked.  PSYCHIATRIC: The patient is alert and oriented x 3.  SKIN: No obvious rash, lesion, or ulcer.   DATA REVIEW:   CBC Recent Labs  Lab 04/27/21 0446  WBC 10.9*  HGB 9.8*  HCT 29.8*  PLT 214    Chemistries  Recent Labs  Lab 04/30/21 0632  NA 134*  K 3.8  CL 100  CO2 28  GLUCOSE 150*  BUN 84*  CREATININE 3.03*  CALCIUM 8.0*     Microbiology Results  Results for orders placed or performed during the hospital encounter of 04/17/21  Resp Panel by RT-PCR (Flu A&B, Covid) Nasopharyngeal Swab     Status: None   Collection Time: 04/17/21  8:58 PM   Specimen: Nasopharyngeal  Swab; Nasopharyngeal(NP) swabs in vial transport medium  Result Value Ref Range Status   SARS Coronavirus 2 by RT PCR NEGATIVE NEGATIVE Final    Comment: (NOTE) SARS-CoV-2 target nucleic acids are NOT DETECTED.  The SARS-CoV-2 RNA is generally detectable in upper respiratory specimens during the acute phase of infection. The lowest concentration of SARS-CoV-2 viral copies this assay can detect is 138 copies/mL. A negative result does not preclude SARS-Cov-2 infection and should not be used as the sole basis for treatment or other patient management decisions. A negative result may occur with  improper specimen collection/handling, submission of specimen other than nasopharyngeal swab, presence of viral mutation(s) within the areas targeted by this assay, and inadequate number of viral copies(<138 copies/mL). A negative result must be combined with clinical observations, patient history, and epidemiological information. The expected result is Negative.  Fact Sheet for Patients:  EntrepreneurPulse.com.au  Fact Sheet for Healthcare Providers:  IncredibleEmployment.be  This test is no t yet approved or cleared by the [place] FDA and  has been authorized for detection and/or diagnosis of SARS-CoV-2 by FDA under  an Emergency Use Authorization (EUA). This EUA will remain  in effect (meaning this test can be used) for the duration of the COVID-19 declaration under Section 564(b)(1) of the Act, 21 U.S.C.section 360bbb-3(b)(1), unless the authorization is terminated  or revoked sooner.       Influenza A by PCR NEGATIVE NEGATIVE Final   Influenza B by PCR NEGATIVE NEGATIVE Final    Comment: (NOTE) The Xpert Xpress SARS-CoV-2/FLU/RSV plus assay is intended as an aid in the diagnosis of influenza from Nasopharyngeal swab specimens and should not be used as a sole basis for treatment. Nasal washings and aspirates are unacceptable for Xpert Xpress  SARS-CoV-2/FLU/RSV testing.  Fact Sheet for Patients: EntrepreneurPulse.com.au  Fact Sheet for Healthcare Providers: IncredibleEmployment.be  This test is not yet approved or cleared by the [place] FDA and has been authorized for detection and/or diagnosis of SARS-CoV-2 by FDA under an Emergency Use Authorization (EUA). This EUA will remain in effect (meaning this test can be used) for the duration of the COVID-19 declaration under Section 564(b)(1) of the Act, 21 U.S.C. section 360bbb-3(b)(1), unless the authorization is terminated or revoked.  Performed at [facility], [address] [place], [place] [address]   Urine Culture     Status: Abnormal   Collection Time: 04/22/21 11:53 AM   Specimen: Urine, Clean Catch  Result Value Ref Range Status   Specimen Description   Final    URINE, CLEAN CATCH Performed at [facility], [address] [place], [place] [address]    Special Requests   Final    NONE Performed at [facility], [place], [place] [address]    Culture (A)  Final    50,000 COLONIES/mL ESCHERICHIA COLI >=100,000 COLONIES/mL SERRATIA MARCESCENS    Report Status 04/25/2021 FINAL  Final   Organism ID, Bacteria ESCHERICHIA COLI (A)  Final   Organism ID, Bacteria SERRATIA MARCESCENS (A)  Final      Susceptibility   Escherichia coli - MIC*    AMPICILLIN 4 SENSITIVE Sensitive     CEFAZOLIN <=4 SENSITIVE Sensitive     CEFEPIME <=0.12 SENSITIVE Sensitive     CEFTRIAXONE <=0.25 SENSITIVE Sensitive     CIPROFLOXACIN <=0.25 SENSITIVE Sensitive     GENTAMICIN <=1 SENSITIVE Sensitive     IMIPENEM <=0.25 SENSITIVE Sensitive     NITROFURANTOIN <=16 SENSITIVE Sensitive     TRIMETH/SULFA <=20 SENSITIVE Sensitive     AMPICILLIN/SULBACTAM <=2 SENSITIVE Sensitive     PIP/TAZO <=4 SENSITIVE Sensitive     * 50,000 COLONIES/mL ESCHERICHIA COLI   Serratia marcescens - MIC*    CEFAZOLIN  >=64 RESISTANT Resistant     CEFEPIME <=0.12 SENSITIVE Sensitive     CEFTRIAXONE <=0.25 SENSITIVE Sensitive     CIPROFLOXACIN <=0.25 SENSITIVE Sensitive     GENTAMICIN <=1 SENSITIVE Sensitive     NITROFURANTOIN 128 RESISTANT Resistant     TRIMETH/SULFA <=20 SENSITIVE Sensitive     * >=100,000 COLONIES/mL SERRATIA MARCESCENS  CULTURE, BLOOD (ROUTINE X 2) w Reflex to ID Panel     Status: None   Collection Time: 04/25/21  3:33 PM   Specimen: BLOOD  Result Value Ref Range Status   Specimen Description BLOOD BLOOD RIGHT WRIST  Final   Special Requests   Final    BOTTLES DRAWN AEROBIC AND ANAEROBIC Blood Culture adequate volume   Culture   Final    NO GROWTH 5 DAYS Performed at [facility], [address], [place], [place] [address]  Report Status 04/30/2021 FINAL  Final  CULTURE, BLOOD (ROUTINE X 2) w Reflex to ID Panel     Status: None   Collection Time: 04/25/21  3:38 PM   Specimen: BLOOD  Result Value Ref Range Status   Specimen Description BLOOD BLOOD RIGHT HAND  Final   Special Requests   Final    BOTTLES DRAWN AEROBIC AND ANAEROBIC Blood Culture adequate volume   Culture   Final    NO GROWTH 5 DAYS Performed at [facility], [address] [place], [place] [address]    Report Status 04/30/2021 FINAL  Final     Management plans discussed with the patient, family and they are in agreement.  CODE STATUS:     Code Status Orders  (From admission, onward)           Start     Ordered   04/17/21 2339  Full code  Continuous        04/17/21 2341           Code Status History     Date Active Date Inactive Code Status Order ID Comments User Context   12/06/2020 1037 12/06/2020 1624 Full Code [id number]  Wellington Hampshire, MD Inpatient   08/19/2020 1229 08/19/2020 1849 Full Code [id number]  Minna Merritts, MD Inpatient   09/20/2018 1114 09/22/2018 2127 Full Code [id number]  Bettey Costa, MD ED   01/16/2017 0035 01/18/2017 1702 Full Code [id number]   Lance Coon, MD ED   01/11/2017 1654 01/13/2017 1736 Full Code [id number]  Demetrios Loll, MD Inpatient   01/11/2017 1341 01/11/2017 1654 Full Code [id number]  Wellington Hampshire, MD Inpatient   03/20/2012 2310 03/21/2012 1350 Full Code [id number]  Clayton Bibles, PA ED       TOTAL TIME TAKING CARE OF THIS PATIENT: 34 minutes.    Loletha Grayer M.D on 04/30/2021 at 4:49 PM   [facility]  CC: Primary care physician; [name], MD

## 2021-04-30 NOTE — TOC Transition Note (Addendum)
Transition of Care [facility]) - CM/SW Discharge Note   Patient Details  Name: Stephen [name] MRN: [id number] Date of Birth: 1946/01/08  [facility] CM/SW Contact:  [name], LCSW Phone Number: 04/30/2021, 12:33 PM   Clinical Narrative:   Spoke with [name] with [employer] who confirmed DME will be delivered to patient's home today. Spoke to patient's wife [name] via phone. She said DME should be delivered at [place] stated once DME is delivered, she plans to pick patient up from the hospital. She stated she would have help getting patient into their home when they arrive there. Declined EMS transport.  Updated RN and MD. Per MD, needs OP Palliative as well. Updated [name] with [facility].   Final next level of care: [place] Barriers to Discharge: Barriers Resolved   Patient Goals and CMS Choice Patient states their goals for this hospitalization and ongoing recovery are:: home with home health CMS Medicare.gov Compare Post Acute Care list provided to:: Patient Represenative (must comment) Choice offered to / list presented to : Spouse  Discharge Placement                Patient to be transferred to facility by: [name] Name of family member notified: [name] Patient and family notified of of transfer: 04/30/21  Discharge Plan and Services In-house Referral: Clinical Social Work   Post Acute Care Choice: Home Health          DME Arranged: Wheelchair manual, Environmental consultant rolling, Hospital bed, Other see comment, 3-N-1 (hoyer lift) DME Agency: [employer] Date DME Agency Contacted: 04/30/21   Representative spoke with at DME Agency: [name]: [place] Date [place]: 04/30/21   Representative spoke with at [place]: [place] ([name]) Interventions     Readmission Risk Interventions Readmission Risk Prevention Plan 09/20/2018  Transportation Screening Complete  PCP or  Specialist Appt within 3-5 Days Complete  HRI or [place] Patient refused  Social Work Consult for [place] Planning/Counseling Patient refused  Palliative Care Screening Not Applicable  Medication Review Press photographer) Complete  Some recent data might be hidden

## 2021-05-02 ENCOUNTER — Emergency Department: Payer: Medicare HMO

## 2021-05-02 ENCOUNTER — Other Ambulatory Visit: Payer: Self-pay

## 2021-05-02 DIAGNOSIS — I272 Pulmonary hypertension, unspecified: Secondary | ICD-10-CM

## 2021-05-02 DIAGNOSIS — R319 Hematuria, unspecified: Secondary | ICD-10-CM | POA: Diagnosis not present

## 2021-05-02 DIAGNOSIS — J9611 Chronic respiratory failure with hypoxia: Secondary | ICD-10-CM | POA: Diagnosis not present

## 2021-05-02 DIAGNOSIS — I200 Unstable angina: Secondary | ICD-10-CM | POA: Diagnosis present

## 2021-05-02 DIAGNOSIS — E538 Deficiency of other specified B group vitamins: Secondary | ICD-10-CM | POA: Diagnosis not present

## 2021-05-02 DIAGNOSIS — N170 Acute kidney failure with tubular necrosis: Secondary | ICD-10-CM | POA: Diagnosis present

## 2021-05-02 DIAGNOSIS — U071 COVID-19: Secondary | ICD-10-CM | POA: Diagnosis not present

## 2021-05-02 DIAGNOSIS — I517 Cardiomegaly: Secondary | ICD-10-CM | POA: Diagnosis not present

## 2021-05-02 DIAGNOSIS — Y95 Nosocomial condition: Secondary | ICD-10-CM | POA: Diagnosis not present

## 2021-05-02 DIAGNOSIS — Z48813 Encounter for surgical aftercare following surgery on the respiratory system: Secondary | ICD-10-CM | POA: Diagnosis not present

## 2021-05-02 DIAGNOSIS — J9690 Respiratory failure, unspecified, unspecified whether with hypoxia or hypercapnia: Secondary | ICD-10-CM | POA: Diagnosis not present

## 2021-05-02 DIAGNOSIS — Y846 Urinary catheterization as the cause of abnormal reaction of the patient, or of later complication, without mention of misadventure at the time of the procedure: Secondary | ICD-10-CM | POA: Diagnosis not present

## 2021-05-02 DIAGNOSIS — J9601 Acute respiratory failure with hypoxia: Secondary | ICD-10-CM

## 2021-05-02 DIAGNOSIS — Z85828 Personal history of other malignant neoplasm of skin: Secondary | ICD-10-CM

## 2021-05-02 DIAGNOSIS — Z7982 Long term (current) use of aspirin: Secondary | ICD-10-CM

## 2021-05-02 DIAGNOSIS — R52 Pain, unspecified: Secondary | ICD-10-CM

## 2021-05-02 DIAGNOSIS — I509 Heart failure, unspecified: Secondary | ICD-10-CM | POA: Diagnosis not present

## 2021-05-02 DIAGNOSIS — R0789 Other chest pain: Secondary | ICD-10-CM | POA: Diagnosis not present

## 2021-05-02 DIAGNOSIS — I10 Essential (primary) hypertension: Secondary | ICD-10-CM | POA: Diagnosis not present

## 2021-05-02 DIAGNOSIS — L8990 Pressure ulcer of unspecified site, unspecified stage: Secondary | ICD-10-CM | POA: Diagnosis not present

## 2021-05-02 DIAGNOSIS — N138 Other obstructive and reflux uropathy: Secondary | ICD-10-CM | POA: Diagnosis present

## 2021-05-02 DIAGNOSIS — R310 Gross hematuria: Secondary | ICD-10-CM | POA: Diagnosis not present

## 2021-05-02 DIAGNOSIS — I8002 Phlebitis and thrombophlebitis of superficial vessels of left lower extremity: Secondary | ICD-10-CM | POA: Diagnosis not present

## 2021-05-02 DIAGNOSIS — J9602 Acute respiratory failure with hypercapnia: Secondary | ICD-10-CM | POA: Diagnosis not present

## 2021-05-02 DIAGNOSIS — J9600 Acute respiratory failure, unspecified whether with hypoxia or hypercapnia: Secondary | ICD-10-CM | POA: Diagnosis not present

## 2021-05-02 DIAGNOSIS — I130 Hypertensive heart and chronic kidney disease with heart failure and stage 1 through stage 4 chronic kidney disease, or unspecified chronic kidney disease: Secondary | ICD-10-CM | POA: Diagnosis present

## 2021-05-02 DIAGNOSIS — G9341 Metabolic encephalopathy: Secondary | ICD-10-CM | POA: Diagnosis present

## 2021-05-02 DIAGNOSIS — J90 Pleural effusion, not elsewhere classified: Secondary | ICD-10-CM

## 2021-05-02 DIAGNOSIS — K5900 Constipation, unspecified: Secondary | ICD-10-CM | POA: Diagnosis present

## 2021-05-02 DIAGNOSIS — L89312 Pressure ulcer of right buttock, stage 2: Secondary | ICD-10-CM | POA: Diagnosis present

## 2021-05-02 DIAGNOSIS — I2721 Secondary pulmonary arterial hypertension: Secondary | ICD-10-CM | POA: Diagnosis present

## 2021-05-02 DIAGNOSIS — R778 Other specified abnormalities of plasma proteins: Secondary | ICD-10-CM

## 2021-05-02 DIAGNOSIS — I214 Non-ST elevation (NSTEMI) myocardial infarction: Secondary | ICD-10-CM | POA: Diagnosis not present

## 2021-05-02 DIAGNOSIS — Z66 Do not resuscitate: Secondary | ICD-10-CM | POA: Diagnosis present

## 2021-05-02 DIAGNOSIS — I248 Other forms of acute ischemic heart disease: Secondary | ICD-10-CM | POA: Diagnosis not present

## 2021-05-02 DIAGNOSIS — Z7401 Bed confinement status: Secondary | ICD-10-CM

## 2021-05-02 DIAGNOSIS — R531 Weakness: Secondary | ICD-10-CM | POA: Diagnosis not present

## 2021-05-02 DIAGNOSIS — E039 Hypothyroidism, unspecified: Secondary | ICD-10-CM | POA: Diagnosis present

## 2021-05-02 DIAGNOSIS — I129 Hypertensive chronic kidney disease with stage 1 through stage 4 chronic kidney disease, or unspecified chronic kidney disease: Secondary | ICD-10-CM | POA: Diagnosis not present

## 2021-05-02 DIAGNOSIS — I5032 Chronic diastolic (congestive) heart failure: Secondary | ICD-10-CM | POA: Diagnosis not present

## 2021-05-02 DIAGNOSIS — Z8249 Family history of ischemic heart disease and other diseases of the circulatory system: Secondary | ICD-10-CM

## 2021-05-02 DIAGNOSIS — J9621 Acute and chronic respiratory failure with hypoxia: Secondary | ICD-10-CM | POA: Diagnosis present

## 2021-05-02 DIAGNOSIS — I5033 Acute on chronic diastolic (congestive) heart failure: Secondary | ICD-10-CM | POA: Diagnosis not present

## 2021-05-02 DIAGNOSIS — I447 Left bundle-branch block, unspecified: Secondary | ICD-10-CM | POA: Diagnosis present

## 2021-05-02 DIAGNOSIS — J9811 Atelectasis: Secondary | ICD-10-CM | POA: Diagnosis not present

## 2021-05-02 DIAGNOSIS — R338 Other retention of urine: Secondary | ICD-10-CM | POA: Diagnosis not present

## 2021-05-02 DIAGNOSIS — I3139 Other pericardial effusion (noninflammatory): Secondary | ICD-10-CM | POA: Diagnosis not present

## 2021-05-02 DIAGNOSIS — I25110 Atherosclerotic heart disease of native coronary artery with unstable angina pectoris: Secondary | ICD-10-CM | POA: Diagnosis present

## 2021-05-02 DIAGNOSIS — R0689 Other abnormalities of breathing: Secondary | ICD-10-CM | POA: Diagnosis not present

## 2021-05-02 DIAGNOSIS — Z9889 Other specified postprocedural states: Secondary | ICD-10-CM | POA: Diagnosis not present

## 2021-05-02 DIAGNOSIS — R079 Chest pain, unspecified: Secondary | ICD-10-CM | POA: Diagnosis not present

## 2021-05-02 DIAGNOSIS — Z87440 Personal history of urinary (tract) infections: Secondary | ICD-10-CM

## 2021-05-02 DIAGNOSIS — N401 Enlarged prostate with lower urinary tract symptoms: Secondary | ICD-10-CM | POA: Diagnosis present

## 2021-05-02 DIAGNOSIS — E1122 Type 2 diabetes mellitus with diabetic chronic kidney disease: Secondary | ICD-10-CM | POA: Diagnosis present

## 2021-05-02 DIAGNOSIS — D649 Anemia, unspecified: Secondary | ICD-10-CM | POA: Diagnosis not present

## 2021-05-02 DIAGNOSIS — Z6841 Body Mass Index (BMI) 40.0 and over, adult: Secondary | ICD-10-CM | POA: Diagnosis not present

## 2021-05-02 DIAGNOSIS — J440 Chronic obstructive pulmonary disease with acute lower respiratory infection: Secondary | ICD-10-CM | POA: Diagnosis present

## 2021-05-02 DIAGNOSIS — L89322 Pressure ulcer of left buttock, stage 2: Secondary | ICD-10-CM | POA: Diagnosis present

## 2021-05-02 DIAGNOSIS — N184 Chronic kidney disease, stage 4 (severe): Secondary | ICD-10-CM | POA: Diagnosis not present

## 2021-05-02 DIAGNOSIS — Z794 Long term (current) use of insulin: Secondary | ICD-10-CM

## 2021-05-02 DIAGNOSIS — N2581 Secondary hyperparathyroidism of renal origin: Secondary | ICD-10-CM | POA: Diagnosis present

## 2021-05-02 DIAGNOSIS — R0902 Hypoxemia: Secondary | ICD-10-CM

## 2021-05-02 DIAGNOSIS — J849 Interstitial pulmonary disease, unspecified: Secondary | ICD-10-CM

## 2021-05-02 DIAGNOSIS — I808 Phlebitis and thrombophlebitis of other sites: Secondary | ICD-10-CM | POA: Diagnosis present

## 2021-05-02 DIAGNOSIS — R0602 Shortness of breath: Secondary | ICD-10-CM | POA: Diagnosis not present

## 2021-05-02 DIAGNOSIS — Z955 Presence of coronary angioplasty implant and graft: Secondary | ICD-10-CM

## 2021-05-02 DIAGNOSIS — N189 Chronic kidney disease, unspecified: Secondary | ICD-10-CM | POA: Diagnosis not present

## 2021-05-02 DIAGNOSIS — E1151 Type 2 diabetes mellitus with diabetic peripheral angiopathy without gangrene: Secondary | ICD-10-CM | POA: Diagnosis present

## 2021-05-02 DIAGNOSIS — F321 Major depressive disorder, single episode, moderate: Secondary | ICD-10-CM | POA: Diagnosis not present

## 2021-05-02 DIAGNOSIS — J189 Pneumonia, unspecified organism: Secondary | ICD-10-CM | POA: Diagnosis not present

## 2021-05-02 DIAGNOSIS — J8410 Pulmonary fibrosis, unspecified: Secondary | ICD-10-CM | POA: Diagnosis present

## 2021-05-02 DIAGNOSIS — R600 Localized edema: Secondary | ICD-10-CM | POA: Diagnosis not present

## 2021-05-02 DIAGNOSIS — Z87442 Personal history of urinary calculi: Secondary | ICD-10-CM

## 2021-05-02 DIAGNOSIS — Z79890 Hormone replacement therapy (postmenopausal): Secondary | ICD-10-CM

## 2021-05-02 DIAGNOSIS — N186 End stage renal disease: Secondary | ICD-10-CM | POA: Diagnosis not present

## 2021-05-02 DIAGNOSIS — J9622 Acute and chronic respiratory failure with hypercapnia: Secondary | ICD-10-CM | POA: Diagnosis present

## 2021-05-02 DIAGNOSIS — D631 Anemia in chronic kidney disease: Secondary | ICD-10-CM | POA: Diagnosis present

## 2021-05-02 DIAGNOSIS — N179 Acute kidney failure, unspecified: Secondary | ICD-10-CM | POA: Diagnosis not present

## 2021-05-02 DIAGNOSIS — J811 Chronic pulmonary edema: Secondary | ICD-10-CM | POA: Diagnosis not present

## 2021-05-02 DIAGNOSIS — Z833 Family history of diabetes mellitus: Secondary | ICD-10-CM

## 2021-05-02 DIAGNOSIS — Z7189 Other specified counseling: Secondary | ICD-10-CM | POA: Diagnosis not present

## 2021-05-02 DIAGNOSIS — Z79899 Other long term (current) drug therapy: Secondary | ICD-10-CM

## 2021-05-02 DIAGNOSIS — Z825 Family history of asthma and other chronic lower respiratory diseases: Secondary | ICD-10-CM

## 2021-05-02 DIAGNOSIS — F419 Anxiety disorder, unspecified: Secondary | ICD-10-CM | POA: Diagnosis present

## 2021-05-02 DIAGNOSIS — Z515 Encounter for palliative care: Secondary | ICD-10-CM

## 2021-05-02 DIAGNOSIS — R069 Unspecified abnormalities of breathing: Secondary | ICD-10-CM | POA: Diagnosis not present

## 2021-05-02 DIAGNOSIS — E873 Alkalosis: Secondary | ICD-10-CM | POA: Diagnosis not present

## 2021-05-02 DIAGNOSIS — G4733 Obstructive sleep apnea (adult) (pediatric): Secondary | ICD-10-CM | POA: Diagnosis present

## 2021-05-02 DIAGNOSIS — E876 Hypokalemia: Secondary | ICD-10-CM | POA: Diagnosis not present

## 2021-05-02 DIAGNOSIS — Z9981 Dependence on supplemental oxygen: Secondary | ICD-10-CM

## 2021-05-02 DIAGNOSIS — Z7951 Long term (current) use of inhaled steroids: Secondary | ICD-10-CM

## 2021-05-02 DIAGNOSIS — E785 Hyperlipidemia, unspecified: Secondary | ICD-10-CM | POA: Diagnosis present

## 2021-05-02 DIAGNOSIS — R001 Bradycardia, unspecified: Secondary | ICD-10-CM | POA: Diagnosis present

## 2021-05-02 DIAGNOSIS — G9340 Encephalopathy, unspecified: Secondary | ICD-10-CM | POA: Diagnosis not present

## 2021-05-02 DIAGNOSIS — R339 Retention of urine, unspecified: Secondary | ICD-10-CM | POA: Diagnosis not present

## 2021-05-02 DIAGNOSIS — I25118 Atherosclerotic heart disease of native coronary artery with other forms of angina pectoris: Secondary | ICD-10-CM | POA: Diagnosis not present

## 2021-05-02 DIAGNOSIS — Z7902 Long term (current) use of antithrombotics/antiplatelets: Secondary | ICD-10-CM

## 2021-05-02 DIAGNOSIS — T8383XA Hemorrhage of genitourinary prosthetic devices, implants and grafts, initial encounter: Secondary | ICD-10-CM | POA: Diagnosis not present

## 2021-05-02 LAB — URINALYSIS, ROUTINE W REFLEX MICROSCOPIC
Bilirubin Urine: NEGATIVE
Glucose, UA: 50 mg/dL — AB
Hgb urine dipstick: NEGATIVE
Ketones, ur: NEGATIVE mg/dL
Leukocytes,Ua: NEGATIVE
Nitrite: NEGATIVE
Protein, ur: 100 mg/dL — AB
Specific Gravity, Urine: 1.014 (ref 1.005–1.030)
Squamous Epithelial / HPF: NONE SEEN (ref 0–5)
pH: 5 (ref 5.0–8.0)

## 2021-05-02 LAB — CBC WITH DIFFERENTIAL/PLATELET
Abs Immature Granulocytes: 0.07 10*3/uL (ref 0.00–0.07)
Basophils Absolute: 0 10*3/uL (ref 0.0–0.1)
Basophils Relative: 0 %
Eosinophils Absolute: 0.2 10*3/uL (ref 0.0–0.5)
Eosinophils Relative: 1 %
HCT: 29.8 % — ABNORMAL LOW (ref 39.0–52.0)
Hemoglobin: 9.7 g/dL — ABNORMAL LOW (ref 13.0–17.0)
Immature Granulocytes: 1 %
Lymphocytes Relative: 6 %
Lymphs Abs: 0.7 10*3/uL (ref 0.7–4.0)
MCH: 28.5 pg (ref 26.0–34.0)
MCHC: 32.6 g/dL (ref 30.0–36.0)
MCV: 87.6 fL (ref 80.0–100.0)
Monocytes Absolute: 0.8 10*3/uL (ref 0.1–1.0)
Monocytes Relative: 7 %
Neutro Abs: 9.8 10*3/uL — ABNORMAL HIGH (ref 1.7–7.7)
Neutrophils Relative %: 85 %
Platelets: 332 10*3/uL (ref 150–400)
RBC: 3.4 MIL/uL — ABNORMAL LOW (ref 4.22–5.81)
RDW: 15.9 % — ABNORMAL HIGH (ref 11.5–15.5)
WBC: 11.6 10*3/uL — ABNORMAL HIGH (ref 4.0–10.5)
nRBC: 0 % (ref 0.0–0.2)

## 2021-05-02 LAB — BASIC METABOLIC PANEL
Anion gap: 8 (ref 5–15)
BUN: 72 mg/dL — ABNORMAL HIGH (ref 8–23)
CO2: 28 mmol/L (ref 22–32)
Calcium: 8.2 mg/dL — ABNORMAL LOW (ref 8.9–10.3)
Chloride: 99 mmol/L (ref 98–111)
Creatinine, Ser: 3.12 mg/dL — ABNORMAL HIGH (ref 0.61–1.24)
GFR, Estimated: 20 mL/min — ABNORMAL LOW (ref >60)
Glucose, Bld: 241 mg/dL — ABNORMAL HIGH (ref 70–99)
Potassium: 4.5 mmol/L (ref 3.5–5.1)
Sodium: 135 mmol/L (ref 135–145)

## 2021-05-02 LAB — PROTIME-INR
INR: 1.2 (ref 0.8–1.2)
Prothrombin Time: 15 seconds (ref 11.4–15.2)

## 2021-05-02 LAB — RESP PANEL BY RT-PCR (FLU A&B, COVID) ARPGX2
Influenza A by PCR: NEGATIVE
Influenza B by PCR: NEGATIVE
SARS Coronavirus 2 by RT PCR: NEGATIVE

## 2021-05-02 LAB — CBG MONITORING, ED
Glucose-Capillary: 229 mg/dL — ABNORMAL HIGH (ref 70–99)
Glucose-Capillary: 204 mg/dL — ABNORMAL HIGH (ref 70–99)
Glucose-Capillary: 169 mg/dL — ABNORMAL HIGH (ref 70–99)

## 2021-05-02 LAB — APTT: aPTT: 31 seconds (ref 24–36)

## 2021-05-02 LAB — HEMOGLOBIN A1C
Hgb A1c MFr Bld: 7.5 % — ABNORMAL HIGH (ref 4.8–5.6)
Mean Plasma Glucose: 168.55 mg/dL

## 2021-05-02 LAB — BRAIN NATRIURETIC PEPTIDE: B Natriuretic Peptide: 1205.9 pg/mL — ABNORMAL HIGH (ref 0.0–100.0)

## 2021-05-02 LAB — HEPARIN LEVEL (UNFRACTIONATED)
Heparin Unfractionated: 0.46 IU/mL (ref 0.30–0.70)
Heparin Unfractionated: 0.31 IU/mL (ref 0.30–0.70)

## 2021-05-02 LAB — TROPONIN I (HIGH SENSITIVITY)
Troponin I (High Sensitivity): 1417 ng/L (ref <18)
Troponin I (High Sensitivity): 1514 ng/L (ref <18)

## 2021-05-02 MED ORDER — GABAPENTIN 100 MG PO CAPS
100.0000 mg | ORAL_CAPSULE | Freq: Two times a day (BID) | ORAL | Status: DC
  Administered 2021-05-02 – 2021-05-19 (×35): 100 mg via ORAL
  Filled 2021-05-02 (×36): qty 1

## 2021-05-02 MED ORDER — ASPIRIN EC 81 MG PO TBEC
81.0000 mg | DELAYED_RELEASE_TABLET | Freq: Every day | ORAL | Status: DC
  Administered 2021-05-03 – 2021-05-19 (×17): 81 mg via ORAL
  Filled 2021-05-02 (×17): qty 1

## 2021-05-02 MED ORDER — ACETAMINOPHEN 325 MG PO TABS
650.0000 mg | ORAL_TABLET | ORAL | Status: DC | PRN
  Administered 2021-05-02 – 2021-05-04 (×3): 650 mg via ORAL
  Filled 2021-05-02 (×3): qty 2

## 2021-05-02 MED ORDER — ATORVASTATIN CALCIUM 80 MG PO TABS
80.0000 mg | ORAL_TABLET | Freq: Every day | ORAL | Status: DC
  Administered 2021-05-02 – 2021-05-18 (×17): 80 mg via ORAL
  Filled 2021-05-02 (×8): qty 1
  Filled 2021-05-02: qty 4
  Filled 2021-05-02 (×5): qty 1
  Filled 2021-05-02: qty 4
  Filled 2021-05-02 (×2): qty 1

## 2021-05-02 MED ORDER — MORPHINE SULFATE (PF) 2 MG/ML IV SOLN
2.0000 mg | Freq: Once | INTRAVENOUS | Status: AC
  Administered 2021-05-02: 2 mg via INTRAVENOUS
  Filled 2021-05-02: qty 1

## 2021-05-02 MED ORDER — UMECLIDINIUM BROMIDE 62.5 MCG/ACT IN AEPB
1.0000 | INHALATION_SPRAY | Freq: Every day | RESPIRATORY_TRACT | Status: DC
  Administered 2021-05-02 – 2021-05-19 (×18): 1 via RESPIRATORY_TRACT
  Filled 2021-05-02 (×3): qty 7

## 2021-05-02 MED ORDER — POLYETHYLENE GLYCOL 3350 17 G PO PACK
17.0000 g | PACK | Freq: Every day | ORAL | Status: DC | PRN
  Administered 2021-05-05 – 2021-05-06 (×2): 17 g via ORAL
  Filled 2021-05-02 (×2): qty 1

## 2021-05-02 MED ORDER — ISOSORBIDE MONONITRATE ER 60 MG PO TB24
120.0000 mg | ORAL_TABLET | Freq: Every day | ORAL | Status: DC
  Administered 2021-05-02 – 2021-05-19 (×18): 120 mg via ORAL
  Filled 2021-05-02 (×19): qty 2

## 2021-05-02 MED ORDER — CLOPIDOGREL BISULFATE 75 MG PO TABS
75.0000 mg | ORAL_TABLET | Freq: Every day | ORAL | Status: DC
  Administered 2021-05-02 – 2021-05-19 (×18): 75 mg via ORAL
  Filled 2021-05-02 (×18): qty 1

## 2021-05-02 MED ORDER — DOXYCYCLINE HYCLATE 100 MG PO TABS
100.0000 mg | ORAL_TABLET | Freq: Two times a day (BID) | ORAL | Status: AC
  Administered 2021-05-02 – 2021-05-05 (×8): 100 mg via ORAL
  Filled 2021-05-02 (×9): qty 1

## 2021-05-02 MED ORDER — ALBUTEROL SULFATE (2.5 MG/3ML) 0.083% IN NEBU
2.5000 mg | INHALATION_SOLUTION | Freq: Four times a day (QID) | RESPIRATORY_TRACT | Status: DC | PRN
  Administered 2021-05-02 – 2021-05-19 (×10): 2.5 mg via RESPIRATORY_TRACT
  Filled 2021-05-02 (×12): qty 3

## 2021-05-02 MED ORDER — MONTELUKAST SODIUM 10 MG PO TABS
10.0000 mg | ORAL_TABLET | Freq: Every day | ORAL | Status: DC
  Administered 2021-05-02 – 2021-05-19 (×18): 10 mg via ORAL
  Filled 2021-05-02 (×18): qty 1

## 2021-05-02 MED ORDER — INSULIN DETEMIR 100 UNIT/ML ~~LOC~~ SOLN
30.0000 [IU] | Freq: Every day | SUBCUTANEOUS | Status: DC
  Administered 2021-05-04 – 2021-05-18 (×16): 30 [IU] via SUBCUTANEOUS
  Filled 2021-05-02 (×18): qty 0.3

## 2021-05-02 MED ORDER — NITROGLYCERIN 0.4 MG SL SUBL: 0.4000 mg | SUBLINGUAL_TABLET | SUBLINGUAL | Status: DC | PRN

## 2021-05-02 MED ORDER — ONDANSETRON HCL 4 MG/2ML IJ SOLN
4.0000 mg | Freq: Four times a day (QID) | INTRAMUSCULAR | Status: DC | PRN
  Administered 2021-05-03 – 2021-05-15 (×8): 4 mg via INTRAVENOUS
  Filled 2021-05-02 (×8): qty 2

## 2021-05-02 MED ORDER — FLUTICASONE-UMECLIDIN-VILANT 200-62.5-25 MCG/ACT IN AEPB: 1.0000 | INHALATION_SPRAY | Freq: Every day | RESPIRATORY_TRACT | Status: DC

## 2021-05-02 MED ORDER — LEVOTHYROXINE SODIUM 50 MCG PO TABS
50.0000 ug | ORAL_TABLET | Freq: Every day | ORAL | Status: DC
  Administered 2021-05-02 – 2021-05-19 (×18): 50 ug via ORAL
  Filled 2021-05-02 (×18): qty 1

## 2021-05-02 MED ORDER — HYDROCODONE-ACETAMINOPHEN 5-325 MG PO TABS
1.0000 | ORAL_TABLET | Freq: Three times a day (TID) | ORAL | Status: DC | PRN
  Administered 2021-05-02 – 2021-05-04 (×5): 1 via ORAL
  Filled 2021-05-02 (×5): qty 1

## 2021-05-02 MED ORDER — CARVEDILOL 25 MG PO TABS
25.0000 mg | ORAL_TABLET | Freq: Two times a day (BID) | ORAL | Status: DC
  Administered 2021-05-03 – 2021-05-16 (×25): 25 mg via ORAL
  Filled 2021-05-02 (×26): qty 1

## 2021-05-02 MED ORDER — ASPIRIN 81 MG PO CHEW
324.0000 mg | CHEWABLE_TABLET | Freq: Once | ORAL | Status: AC
  Administered 2021-05-02: 324 mg via ORAL
  Filled 2021-05-02: qty 4

## 2021-05-02 MED ORDER — EZETIMIBE 10 MG PO TABS
10.0000 mg | ORAL_TABLET | Freq: Every day | ORAL | Status: DC
  Administered 2021-05-02 – 2021-05-19 (×18): 10 mg via ORAL
  Filled 2021-05-02 (×19): qty 1

## 2021-05-02 MED ORDER — HEPARIN BOLUS VIA INFUSION
4000.0000 [IU] | Freq: Once | INTRAVENOUS | Status: AC
  Administered 2021-05-02: 4000 [IU] via INTRAVENOUS
  Filled 2021-05-02: qty 4000

## 2021-05-02 MED ORDER — NITROGLYCERIN 0.4 MG SL SUBL
0.4000 mg | SUBLINGUAL_TABLET | SUBLINGUAL | Status: DC | PRN
  Filled 2021-05-02: qty 1

## 2021-05-02 MED ORDER — MORPHINE SULFATE (PF) 4 MG/ML IV SOLN
4.0000 mg | Freq: Once | INTRAVENOUS | Status: AC
  Administered 2021-05-02: 4 mg via INTRAVENOUS
  Filled 2021-05-02: qty 1

## 2021-05-02 MED ORDER — DORZOLAMIDE HCL-TIMOLOL MAL 2-0.5 % OP SOLN
1.0000 [drp] | Freq: Two times a day (BID) | OPHTHALMIC | Status: DC
  Administered 2021-05-02 – 2021-05-19 (×34): 1 [drp] via OPHTHALMIC
  Filled 2021-05-02: qty 10

## 2021-05-02 MED ORDER — FLUTICASONE FUROATE-VILANTEROL 200-25 MCG/ACT IN AEPB
1.0000 | INHALATION_SPRAY | Freq: Every day | RESPIRATORY_TRACT | Status: DC
  Administered 2021-05-02 – 2021-05-19 (×18): 1 via RESPIRATORY_TRACT
  Filled 2021-05-02 (×2): qty 28

## 2021-05-02 MED ORDER — CALCITRIOL 0.25 MCG PO CAPS
0.2500 ug | ORAL_CAPSULE | Freq: Every day | ORAL | Status: DC
  Administered 2021-05-02 – 2021-05-19 (×18): 0.25 ug via ORAL
  Filled 2021-05-02 (×19): qty 1

## 2021-05-02 MED ORDER — INSULIN ASPART 100 UNIT/ML IJ SOLN
0.0000 [IU] | Freq: Three times a day (TID) | INTRAMUSCULAR | Status: DC
  Administered 2021-05-02 (×2): 7 [IU] via SUBCUTANEOUS
  Administered 2021-05-03: 11 [IU] via SUBCUTANEOUS
  Administered 2021-05-03 (×2): 7 [IU] via SUBCUTANEOUS
  Administered 2021-05-04: 4 [IU] via SUBCUTANEOUS
  Administered 2021-05-04 (×2): 7 [IU] via SUBCUTANEOUS
  Administered 2021-05-05 (×2): 4 [IU] via SUBCUTANEOUS
  Administered 2021-05-05: 3 [IU] via SUBCUTANEOUS
  Administered 2021-05-06: 7 [IU] via SUBCUTANEOUS
  Administered 2021-05-06 (×2): 4 [IU] via SUBCUTANEOUS
  Administered 2021-05-07: 7 [IU] via SUBCUTANEOUS
  Administered 2021-05-07: 4 [IU] via SUBCUTANEOUS
  Administered 2021-05-07: 7 [IU] via SUBCUTANEOUS
  Administered 2021-05-08: 3 [IU] via SUBCUTANEOUS
  Administered 2021-05-08 (×2): 4 [IU] via SUBCUTANEOUS
  Administered 2021-05-09 – 2021-05-10 (×3): 3 [IU] via SUBCUTANEOUS
  Administered 2021-05-10: 4 [IU] via SUBCUTANEOUS
  Administered 2021-05-10: 3 [IU] via SUBCUTANEOUS
  Administered 2021-05-11: 11 [IU] via SUBCUTANEOUS
  Administered 2021-05-11: 7 [IU] via SUBCUTANEOUS
  Administered 2021-05-11: 4 [IU] via SUBCUTANEOUS
  Administered 2021-05-12: 11 [IU] via SUBCUTANEOUS
  Administered 2021-05-12: 7 [IU] via SUBCUTANEOUS
  Administered 2021-05-12: 3 [IU] via SUBCUTANEOUS
  Administered 2021-05-13 – 2021-05-14 (×4): 4 [IU] via SUBCUTANEOUS
  Administered 2021-05-14 (×2): 7 [IU] via SUBCUTANEOUS
  Administered 2021-05-15: 11 [IU] via SUBCUTANEOUS
  Administered 2021-05-15: 7 [IU] via SUBCUTANEOUS
  Administered 2021-05-15 – 2021-05-16 (×2): 3 [IU] via SUBCUTANEOUS
  Administered 2021-05-16: 4 [IU] via SUBCUTANEOUS
  Administered 2021-05-16: 3 [IU] via SUBCUTANEOUS
  Administered 2021-05-17 (×2): 7 [IU] via SUBCUTANEOUS
  Administered 2021-05-17: 11 [IU] via SUBCUTANEOUS
  Administered 2021-05-18: 4 [IU] via SUBCUTANEOUS
  Administered 2021-05-18: 7 [IU] via SUBCUTANEOUS
  Administered 2021-05-18: 11 [IU] via SUBCUTANEOUS
  Administered 2021-05-19: 13:00:00 15 [IU] via SUBCUTANEOUS
  Administered 2021-05-19: 09:00:00 11 [IU] via SUBCUTANEOUS
  Filled 2021-05-02 (×51): qty 1

## 2021-05-02 MED ORDER — FUROSEMIDE 10 MG/ML IJ SOLN
40.0000 mg | Freq: Once | INTRAMUSCULAR | Status: AC
  Administered 2021-05-02: 40 mg via INTRAVENOUS
  Filled 2021-05-02: qty 4

## 2021-05-02 MED ORDER — FUROSEMIDE 10 MG/ML IJ SOLN
40.0000 mg | Freq: Two times a day (BID) | INTRAMUSCULAR | Status: DC
  Administered 2021-05-02 – 2021-05-03 (×2): 40 mg via INTRAVENOUS
  Filled 2021-05-02 (×2): qty 4

## 2021-05-02 MED ORDER — PANTOPRAZOLE SODIUM 40 MG PO TBEC
40.0000 mg | DELAYED_RELEASE_TABLET | Freq: Every day | ORAL | Status: DC
Start: 2021-05-02 — End: 2021-05-19
  Administered 2021-05-02 – 2021-05-19 (×18): 40 mg via ORAL
  Filled 2021-05-02 (×18): qty 1

## 2021-05-02 MED ORDER — VITAMIN B-12 1000 MCG PO TABS
1000.0000 ug | ORAL_TABLET | Freq: Every day | ORAL | Status: DC
  Administered 2021-05-02 – 2021-05-19 (×18): 1000 ug via ORAL
  Filled 2021-05-02 (×19): qty 1

## 2021-05-02 MED ORDER — INSULIN ASPART 100 UNIT/ML IJ SOLN
0.0000 [IU] | Freq: Every day | INTRAMUSCULAR | Status: DC
Start: 2021-05-02 — End: 2021-05-19
  Administered 2021-05-04 – 2021-05-07 (×4): 2 [IU] via SUBCUTANEOUS
  Administered 2021-05-12: 3 [IU] via SUBCUTANEOUS
  Administered 2021-05-13: 2 [IU] via SUBCUTANEOUS
  Administered 2021-05-16: 3 [IU] via SUBCUTANEOUS
  Filled 2021-05-02 (×8): qty 1

## 2021-05-02 MED ORDER — ONDANSETRON HCL 4 MG/2ML IJ SOLN
4.0000 mg | Freq: Once | INTRAMUSCULAR | Status: AC
  Administered 2021-05-02: 4 mg via INTRAVENOUS
  Filled 2021-05-02: qty 2

## 2021-05-02 MED ORDER — TAMSULOSIN HCL 0.4 MG PO CAPS
0.4000 mg | ORAL_CAPSULE | Freq: Every day | ORAL | Status: DC
  Administered 2021-05-02 – 2021-05-18 (×17): 0.4 mg via ORAL
  Filled 2021-05-02 (×17): qty 1

## 2021-05-02 MED ORDER — IPRATROPIUM-ALBUTEROL 0.5-2.5 (3) MG/3ML IN SOLN
3.0000 mL | Freq: Once | RESPIRATORY_TRACT | Status: AC
  Administered 2021-05-02: 3 mL via RESPIRATORY_TRACT
  Filled 2021-05-02: qty 3

## 2021-05-02 MED ORDER — HEPARIN (PORCINE) 25000 UT/250ML-% IV SOLN
1350.0000 [IU]/h | INTRAVENOUS | Status: DC
  Administered 2021-05-02 – 2021-05-03 (×4): 1200 [IU]/h via INTRAVENOUS
  Administered 2021-05-04: 1350 [IU]/h via INTRAVENOUS
  Filled 2021-05-02 (×3): qty 250

## 2021-05-02 NOTE — ED Notes (Signed)
Pt c/o SHOB at this time. MD made aware

## 2021-05-02 NOTE — Progress Notes (Signed)
Patient ID: Stephen [name], male   DOB: 17-Nov-1945, 75 y.o.   MRN: [id number] [facility] PROGRESS NOTE  Stephen [name] PPJ:[id number] DOB: 1946/03/27 DOA: 04/03/2021 PCP: [name], MD  HPI/Subjective: Patient states that he does not think he can go home because he had severe shortness of breath.  Feeling a little bit better now.  Chest pain when trying to take a deep breath.  Found to have elevated troponin and started on heparin drip.  Objective: Vitals:   04/13/2021 0800 04/08/2021 1200  BP: (!) 145/58 133/61  Pulse: (!) 59 (!) 56  Resp: 18 18  Temp:    SpO2: 99% 99%   No intake or output data in the 24 hours ending 04/29/2021 1448 Filed Weights   04/27/2021 0211  Weight: 114.3 kg    ROS: Review of Systems  Respiratory:  Positive for cough and shortness of breath.   Cardiovascular:  Positive for chest pain.  Gastrointestinal:  Positive for abdominal pain.  Exam: Physical Exam HENT:     Head: Normocephalic.     Mouth/Throat:     Pharynx: No oropharyngeal exudate.  Eyes:     General: Lids are normal.     Conjunctiva/sclera: Conjunctivae normal.  Cardiovascular:     Rate and Rhythm: Normal rate and regular rhythm.     Heart sounds: Normal heart sounds, S1 normal and S2 normal.  Pulmonary:     Breath sounds: Examination of the right-lower field reveals decreased breath sounds and rales. Examination of the left-lower field reveals decreased breath sounds and rales. Decreased breath sounds and rales present. No wheezing or rhonchi.  Abdominal:     Palpations: Abdomen is soft.     Tenderness: There is no abdominal tenderness.  Musculoskeletal:     Left upper arm: Swelling present.     Right lower leg: Swelling present.     Left lower leg: Swelling present.  Skin:    General: Skin is warm.     Findings: No rash.  Neurological:     Mental Status: He is alert and oriented to person, place, and time.      Scheduled Meds:  [START ON 05/03/2021] aspirin EC  81  mg Oral Daily   atorvastatin  80 mg Oral QHS   calcitRIOL  0.25 mcg Oral Daily   carvedilol  25 mg Oral BID WC   clopidogrel  75 mg Oral Daily   dorzolamide-timolol  1 drop Both Eyes BID   doxycycline  100 mg Oral Q12H   ezetimibe  10 mg Oral Daily   fluticasone furoate-vilanterol  1 puff Inhalation Daily   And   umeclidinium bromide  1 puff Inhalation Daily   furosemide  40 mg Intravenous BID   gabapentin  100 mg Oral BID   insulin aspart  0-20 Units Subcutaneous TID WC   insulin aspart  0-5 Units Subcutaneous QHS   insulin detemir  30 Units Subcutaneous QHS   isosorbide mononitrate  120 mg Oral Daily   levothyroxine  50 mcg Oral Q0600   montelukast  10 mg Oral Daily   pantoprazole  40 mg Oral Daily   tamsulosin  0.4 mg Oral QHS   cyanocobalamin  1,000 mcg Oral Daily   Continuous Infusions:  heparin 1,200 Units/hr (04/19/2021 0512)    Assessment/Plan:  Acute on chronic diastolic congestive heart failure.  Continue Coreg.  IV Lasix started. Elevated troponin with recent NSTEMI.  Placed on heparin drip for 48 hours.  Continue Coreg and Imdur.  Patient on Plavix and atorvastatin with recent LDL of 69.  Medical management with chronic kidney disease. Acute on chronic hypoxic hypercapnic respiratory failure.  Use BiPAP at night.  Patient was on 10 L of oxygen when I saw him and I dialed him down to 4.  If need to go higher than 6 L must change to high flow nasal cannula.  Patient has interstitial lung disease and pulmonary artery hypertension. Chronic kidney disease stage IV.  Continue to monitor closely with diuresis. Generalized weakness.  Patient did not want to go to rehab but wanted to go home previously. Type 2 diabetes mellitus with chronic kidney disease stage IV.  Will be Levemir 30 units at night and short acting insulin prior to meals Sleep apnea and morbid obesity.  BMI 41.93 is likely not accurate because he was higher than that on previous discharge.  Continue BiPAP at  night. Superficial thrombophlebitis left arm.  Conservative management.  Patient on heparin drip secondary to elevated troponin.  Continue doxycycline. Vitamin B12 deficiency continue oral B12.  Received IM B12 injections on previous hospitalization. Palliative care consultation.  Overall prognosis poor.  Confirmed full CODE STATUS today.  Will be a difficult balance with CHF, acute on chronic renal insufficiency, acute on chronic hypoxic hypercarbic respiratory failure.        Code Status:     Code Status Orders  (From admission, onward)           Start     Ordered   04/02/2021 0518  Full code  Continuous        04/26/2021 0519           Code Status History     Date Active Date Inactive Code Status Order ID Comments User Context   04/17/2021 2341 04/30/2021 2257 Full Code [id number]  Athena Masse, MD ED   12/06/2020 1037 12/06/2020 1624 Full Code [id number]  Wellington Hampshire, MD Inpatient   08/19/2020 1229 08/19/2020 1849 Full Code [id number]  Minna Merritts, MD Inpatient   09/20/2018 1114 09/22/2018 2127 Full Code [id number]  Bettey Costa, MD ED   01/16/2017 0035 01/18/2017 1702 Full Code [id number]  Lance Coon, MD ED   01/11/2017 1654 01/13/2017 1736 Full Code [id number]  Demetrios Loll, MD Inpatient   01/11/2017 1341 01/11/2017 1654 Full Code [id number]  Wellington Hampshire, MD Inpatient   03/20/2012 2310 03/21/2012 1350 Full Code [id number]  Clayton Bibles, PA ED      Family Communication: Spoke with wife at the bedside Disposition Plan: Status is: Observation  Time spent: 31 minutes    [place]

## 2021-05-02 NOTE — ED Provider Notes (Signed)
SARS-CoV-2 virus that causes COVID-19. Institutional protocols and algorithms that pertain to the evaluation of patients at risk for COVID-19 are in a state of rapid change based on information released by regulatory bodies including the CDC and federal and state organizations. These policies and algorithms were followed during the patient's care in the ED.  Some ED evaluations and interventions may be delayed as a result of limited staffing during and the pandemic.*   Note:  This document was prepared using Dragon voice recognition software and may include unintentional dictation errors.    Stephen [name], Delice Bison, DO 05/01/2021 [phone]  Excl. in GC? --    CONSTITUTIONAL: Alert and oriented and responds appropriately to questions.  Elderly, chronically ill-appearing, obese HEAD: Normocephalic EYES: Conjunctivae clear, pupils appear equal, EOM appear intact ENT: normal nose; moist mucous membranes NECK: Supple, normal ROM CARD: RRR; S1 and S2 appreciated; no murmurs, no clicks, no rubs, no gallops CHEST:  Chest wall is tender to palpation.  No crepitus, ecchymosis, erythema,  warmth, rash or other lesions present.   RESP: Normal chest excursion without splinting or tachypnea; breath sounds equal bilaterally but he does have bibasilar rales.  He is speaking in short sentences but states he does not have any worsening shortness of breath compared to previous.  On 4 L nasal cannula satting 98%. ABD/GI: Normal bowel sounds; non-distended; soft, non-tender, no rebound, no guarding, no peritoneal signs, no hepatosplenomegaly BACK: The back appears normal EXT: Normal ROM in all joints; no deformity noted, trace edema in bilateral lower extremities, no calf tenderness or calf swelling; extremities warm and well-perfused, swelling noted to the dorsal left hand but no redness, warmth SKIN: Normal color for age and race; warm; no rash on exposed skin NEURO: Moves all extremities equally PSYCH: The patient's mood and manner are appropriate.  ____________________________________________   LABS (all labs ordered are listed, but only abnormal results are displayed)  Labs Reviewed  CBC WITH DIFFERENTIAL/PLATELET - Abnormal; Notable for the following components:      Result Value   WBC 11.6 (*)    RBC 3.40 (*)    Hemoglobin 9.7 (*)    HCT 29.8 (*)    RDW 15.9 (*)    Neutro Abs 9.8 (*)    All other components within normal limits  BASIC METABOLIC PANEL - Abnormal; Notable for the following components:   Glucose, Bld 241 (*)    BUN 72 (*)    Creatinine, Ser 3.12 (*)    Calcium 8.2 (*)    GFR, Estimated 20 (*)    All other components within normal limits  BRAIN NATRIURETIC PEPTIDE - Abnormal; Notable for the following components:   B Natriuretic Peptide 1,205.9 (*)    All other components within normal limits  TROPONIN I (HIGH SENSITIVITY) - Abnormal; Notable for the following components:   Troponin I (High Sensitivity) 1,417 (*)    All other components within normal limits  TROPONIN I (HIGH SENSITIVITY) - Abnormal; Notable for the following components:   Troponin I  (High Sensitivity) 1,514 (*)    All other components within normal limits  URINALYSIS, ROUTINE W REFLEX MICROSCOPIC   ____________________________________________  EKG   EKG Interpretation  Date/Time:  Monday May 02 2021 02:19:26 EDT Ventricular Rate:  59 PR Interval:  205 QRS Duration: 138 QT Interval:  475 QTC Calculation: 471 R Axis:   -27 Text Interpretation: Sinus rhythm Left bundle branch block Confirmed by Pryor Curia [phone]) on 04/18/2021 2:21:47 AM        ____________________________________________  RADIOLOGY Jessie Foot Chayah Mckee, personally viewed and evaluated these images (plain radiographs) as part of my medical decision making, as well as reviewing the written report by the radiologist.  ED MD interpretation: Chest x-ray shows no infiltrate or edema.  Official radiology report(s): DG Chest Port 1 View  Result Date: 04/25/2021 CLINICAL DATA:  Shortness of breath EXAM: PORTABLE CHEST 1 VIEW COMPARISON:  Chest radiograph dated 04/26/2021. CT chest dated 04/05/2021. FINDINGS: Lower lobe predominant scarring/fibrosis. When correlating with prior CT, this is compatible with chronic interstitial lung disease. No definite pleural effusions. No pneumothorax. Cardiomegaly.  Excl. in GC? --    CONSTITUTIONAL: Alert and oriented and responds appropriately to questions.  Elderly, chronically ill-appearing, obese HEAD: Normocephalic EYES: Conjunctivae clear, pupils appear equal, EOM appear intact ENT: normal nose; moist mucous membranes NECK: Supple, normal ROM CARD: RRR; S1 and S2 appreciated; no murmurs, no clicks, no rubs, no gallops CHEST:  Chest wall is tender to palpation.  No crepitus, ecchymosis, erythema,  warmth, rash or other lesions present.   RESP: Normal chest excursion without splinting or tachypnea; breath sounds equal bilaterally but he does have bibasilar rales.  He is speaking in short sentences but states he does not have any worsening shortness of breath compared to previous.  On 4 L nasal cannula satting 98%. ABD/GI: Normal bowel sounds; non-distended; soft, non-tender, no rebound, no guarding, no peritoneal signs, no hepatosplenomegaly BACK: The back appears normal EXT: Normal ROM in all joints; no deformity noted, trace edema in bilateral lower extremities, no calf tenderness or calf swelling; extremities warm and well-perfused, swelling noted to the dorsal left hand but no redness, warmth SKIN: Normal color for age and race; warm; no rash on exposed skin NEURO: Moves all extremities equally PSYCH: The patient's mood and manner are appropriate.  ____________________________________________   LABS (all labs ordered are listed, but only abnormal results are displayed)  Labs Reviewed  CBC WITH DIFFERENTIAL/PLATELET - Abnormal; Notable for the following components:      Result Value   WBC 11.6 (*)    RBC 3.40 (*)    Hemoglobin 9.7 (*)    HCT 29.8 (*)    RDW 15.9 (*)    Neutro Abs 9.8 (*)    All other components within normal limits  BASIC METABOLIC PANEL - Abnormal; Notable for the following components:   Glucose, Bld 241 (*)    BUN 72 (*)    Creatinine, Ser 3.12 (*)    Calcium 8.2 (*)    GFR, Estimated 20 (*)    All other components within normal limits  BRAIN NATRIURETIC PEPTIDE - Abnormal; Notable for the following components:   B Natriuretic Peptide 1,205.9 (*)    All other components within normal limits  TROPONIN I (HIGH SENSITIVITY) - Abnormal; Notable for the following components:   Troponin I (High Sensitivity) 1,417 (*)    All other components within normal limits  TROPONIN I (HIGH SENSITIVITY) - Abnormal; Notable for the following components:   Troponin I  (High Sensitivity) 1,514 (*)    All other components within normal limits  URINALYSIS, ROUTINE W REFLEX MICROSCOPIC   ____________________________________________  EKG   EKG Interpretation  Date/Time:  Monday May 02 2021 02:19:26 EDT Ventricular Rate:  59 PR Interval:  205 QRS Duration: 138 QT Interval:  475 QTC Calculation: 471 R Axis:   -27 Text Interpretation: Sinus rhythm Left bundle branch block Confirmed by Pryor Curia [phone]) on 04/18/2021 2:21:47 AM        ____________________________________________  RADIOLOGY Jessie Foot Chayah Mckee, personally viewed and evaluated these images (plain radiographs) as part of my medical decision making, as well as reviewing the written report by the radiologist.  ED MD interpretation: Chest x-ray shows no infiltrate or edema.  Official radiology report(s): DG Chest Port 1 View  Result Date: 04/25/2021 CLINICAL DATA:  Shortness of breath EXAM: PORTABLE CHEST 1 VIEW COMPARISON:  Chest radiograph dated 04/26/2021. CT chest dated 04/05/2021. FINDINGS: Lower lobe predominant scarring/fibrosis. When correlating with prior CT, this is compatible with chronic interstitial lung disease. No definite pleural effusions. No pneumothorax. Cardiomegaly.  SARS-CoV-2 virus that causes COVID-19. Institutional protocols and algorithms that pertain to the evaluation of patients at risk for COVID-19 are in a state of rapid change based on information released by regulatory bodies including the CDC and federal and state organizations. These policies and algorithms were followed during the patient's care in the ED.  Some ED evaluations and interventions may be delayed as a result of limited staffing during and the pandemic.*   Note:  This document was prepared using Dragon voice recognition software and may include unintentional dictation errors.    Stephen [name], Delice Bison, DO 05/01/2021 [phone]  SARS-CoV-2 virus that causes COVID-19. Institutional protocols and algorithms that pertain to the evaluation of patients at risk for COVID-19 are in a state of rapid change based on information released by regulatory bodies including the CDC and federal and state organizations. These policies and algorithms were followed during the patient's care in the ED.  Some ED evaluations and interventions may be delayed as a result of limited staffing during and the pandemic.*   Note:  This document was prepared using Dragon voice recognition software and may include unintentional dictation errors.    Stephen [name], Delice Bison, DO 05/01/2021 [phone]  SARS-CoV-2 virus that causes COVID-19. Institutional protocols and algorithms that pertain to the evaluation of patients at risk for COVID-19 are in a state of rapid change based on information released by regulatory bodies including the CDC and federal and state organizations. These policies and algorithms were followed during the patient's care in the ED.  Some ED evaluations and interventions may be delayed as a result of limited staffing during and the pandemic.*   Note:  This document was prepared using Dragon voice recognition software and may include unintentional dictation errors.    Stephen [name], Delice Bison, DO 05/01/2021 [phone]  SARS-CoV-2 virus that causes COVID-19. Institutional protocols and algorithms that pertain to the evaluation of patients at risk for COVID-19 are in a state of rapid change based on information released by regulatory bodies including the CDC and federal and state organizations. These policies and algorithms were followed during the patient's care in the ED.  Some ED evaluations and interventions may be delayed as a result of limited staffing during and the pandemic.*   Note:  This document was prepared using Dragon voice recognition software and may include unintentional dictation errors.    Stephen [name], Delice Bison, DO 05/01/2021 [phone]

## 2021-05-02 NOTE — Progress Notes (Signed)
ANTICOAGULATION CONSULT NOTE - Initial Consult  Pharmacy Consult for  Heparin  Indication: ACS/NSTEMI   Allergies  Allergen Reactions   Calcium Channel Blockers     Would avoid if possible due to h/o peripheral edema    Patient Measurements: Height: 5\' 5"  (165.1 cm) Weight: 114.3 kg (252 lb) IBW/kg (Calculated) : 61.5 Heparin Dosing Weight: 88.1 kg   Vital Signs: Temp: 98.4 F (36.9 C) (10/31 0344) Temp Source: Oral (10/31 0344) BP: 128/62 (10/31 0344) Pulse Rate: 58 (10/31 0344)  Labs: Recent Labs    04/29/21 0839 04/30/21 1448 04/03/2021 0213 04/07/2021 0344  HGB  --   --  9.7*  --   HCT  --   --  29.8*  --   PLT  --   --  332  --   CREATININE 3.45* 3.03* 3.12*  --   TROPONINIHS  --   --  1,417* 1,514*    Estimated Creatinine Clearance: 24.6 mL/min (A) (by C-G formula based on SCr of 3.12 mg/dL (H)).   Medical History: Past Medical History:  Diagnosis Date   Adrenal gland anomaly    Arthritis    CAD (coronary artery disease)    a. 04/2006 MI and PCI/stenting to mLCx & mRCA; b. 07/2009 Cath: patent LCX/RCA stents;  c. 12/2016 NSTEMI/Cath: LM 30, LAD 30p, 76m, 30d, D1/2/3 min irregs, LCX 142m ISR, OM1 min irregs, RCA 20p, 10 ISR, 65m, 50d, RPDA min irregs, RPAV 40-->Med Rx; d. 12/2017 MV: fixed lateral wall scar, mild anterior/anterior septal ischemia. EF 30-44%.   CHF (congestive heart failure) (HCC)    CKD (chronic kidney disease), stage III (HCC)    Colon polyps    DM2 (diabetes mellitus, type 2) (HCC)    insulin requiring   Dyslipidemia    GERD (gastroesophageal reflux disease)    History of echocardiogram    a. TTE 12/2016: EF 50-55%, mod concentric LVH, images inadequate for wall motion assessment, not technically sufficienct to allow for LV dias fxn, calcified mitral annulus    HTN (hypertension)    Kidney stones    Left Renal artery stenosis ([place])    a. 07/2013 s/p PTA/stenting ([place]); b. 07/2017 Renal Duplex: No significant RAS.   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   Recurrent UTI    Rotator cuff disorder    right   Skin cancer    head   Thyroid disorder    Venous insufficiency of both lower extremities    a. s/p laser treatment.    Medications:  (Not in a hospital admission)   Assessment: Pharmacy consulted to dose heparin in this 75 year old male admitted with ACS/NSTEMI.  No prior anticoag noted.  CrCl = 24.6 ml/min   Goal of Therapy:  Heparin level 0.3-0.7 units/ml Monitor platelets by anticoagulation protocol: Yes   Plan:  Give 4000 units bolus x 1 Start heparin infusion at 1200 units/hr Check anti-Xa level in 8 hours and daily while on heparin Continue to monitor H&H and platelets  Dusti Tetro D 04/16/2021,4:36 AM

## 2021-05-02 NOTE — H&P (Addendum)
History and Physical    Stephen [name] SWF:[id number] DOB: 06-18-1946 DOA: 04/15/2021  PCP: [name], MD   Patient coming from: home  I have personally briefly reviewed patient's relevant medical records in [place]  Chief Complaint: chest pain and shortness of breath  HPI: Stephen [name] is a 75 y.o. male with medical history significant for , interstitial lung disease, pulmonary hypertension, OSA with chronic respiratory failure on home O2 at 4L and Bipap, followed by pulmonology, as well as diastolic heart failure, CAD, DM, CKD IV, recently hospitalized from 10/16-10/29 with NSTEMI treated with heparin infusion, and respiratory failure secondary to CHF and COPD exacerbation who returns to the ED 2 days later with complaints of chest pain and shortness of breath. Chest pain was severe heaviness on his chest.EMS reports sats of 91% on his normal 4 L.  They attempted to increase it to 6 L with minimal improvement to 93% and and then put him on a nonrebreather and sats were 100%.  Wife contributes to history .   ED course: On arrival tachypneic at 22 with O2 sat 98% on 6 L with otherwise unremarkable vitals Blood work significant for troponin 1417-1514 and BNP 1205.  Hemoglobin 9.7 and creatinine 3.12.  EKG, personally viewed and interpreted: Sinus rhythm at 59 with left bundle branch block  Chest x-ray with scarring and fibrosis corresponding to suspected chronic interstitial lung disease  Review of Systems: As per HPI otherwise all other systems on review of systems negative.    Past Medical History:  Diagnosis Date   Adrenal gland anomaly    Arthritis    CAD (coronary artery disease)    a. 04/2006 MI and PCI/stenting to mLCx & mRCA; b. 07/2009 Cath: patent LCX/RCA stents;  c. 12/2016 NSTEMI/Cath: LM 30, LAD 30p, 77m, 30d, D1/2/3 min irregs, LCX 145m ISR, OM1 min irregs, RCA 20p, 10 ISR, 20m, 50d, RPDA min irregs, RPAV 40-->Med Rx; d. 12/2017 MV: fixed lateral wall  scar, mild anterior/anterior septal ischemia. EF 30-44%.   CHF (congestive heart failure) (HCC)    CKD (chronic kidney disease), stage III (HCC)    Colon polyps    DM2 (diabetes mellitus, type 2) (HCC)    insulin requiring   Dyslipidemia    GERD (gastroesophageal reflux disease)    History of echocardiogram    a. TTE 12/2016: EF 50-55%, mod concentric LVH, images inadequate for wall motion assessment, not technically sufficienct to allow for LV dias fxn, calcified mitral annulus    HTN (hypertension)    Kidney stones    Left Renal artery stenosis ([place])    a. 07/2013 s/p PTA/stenting ([facility]); b. 07/2017 Renal Duplex: No significant RAS.   Myocardial infarction (HCC)    OSA (obstructive sleep apnea)    Pulmonary fibrosis ([place])    a. 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   Recurrent UTI    Rotator cuff disorder    right   Skin cancer    head   Thyroid disorder    Venous insufficiency of both lower extremities    a. s/p laser treatment.    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   CARPAL TUNNEL RELEASE     bilateral   CATARACT EXTRACTION W/PHACO Right 07/08/2018   Procedure: CATARACT EXTRACTION PHACO AND INTRAOCULAR LENS PLACEMENT ([place]) RIGHT DIABETIC;  Surgeon: Eulogio Bear, MD;  Location: [facility];  Service:  Ophthalmology;  Laterality: Right;  Diabetic - insulin sleep apnea   CATARACT EXTRACTION W/PHACO Left 08/05/2018   Procedure: CATARACT EXTRACTION PHACO AND INTRAOCULAR LENS PLACEMENT ([place]) LEFT DIABETIC;  Surgeon: Eulogio Bear, MD;  Location: [facility];  Service: Ophthalmology;  Laterality: Left;  DIABETIC   CORONARY ANGIOGRAPHY N/A 08/19/2020   Procedure: CORONARY ANGIOGRAPHY;  Surgeon: Minna Merritts, MD;  Location: [facility];  Service: Cardiovascular;  Laterality: N/A;   CORONARY ANGIOPLASTY WITH STENT PLACEMENT  2007   x 2   KNEE  SURGERY     right   LEFT HEART CATH AND CORONARY ANGIOGRAPHY N/A 01/11/2017   Procedure: Left Heart Cath and Coronary Angiography;  Surgeon: Wellington Hampshire, MD;  Location: [facility];  Service: Cardiovascular;  Laterality: N/A;   LUNG BIOPSY     RENAL ARTERY STENT  2015   L   RIGHT HEART CATH Right 12/06/2020   Procedure: RIGHT HEART CATH;  Surgeon: Wellington Hampshire, MD;  Location: [facility];  Service: Cardiovascular;  Laterality: Right;   SKIN CANCER EXCISION     back of head     reports that he has never smoked. He has never used smokeless tobacco. He reports that he does not drink alcohol and does not use drugs.  Allergies  Allergen Reactions   Calcium Channel Blockers     Would avoid if possible due to h/o peripheral edema    Family History  Problem Relation Age of Onset   Heart attack Father    Heart attack Mother    Heart attack Brother    COPD Sister    Arthritis Maternal Grandmother    Breast cancer Maternal Aunt    Diabetes Maternal Aunt    Hypertension Other    Hypertension Maternal Aunt    Prostate cancer Neg Hx    Kidney cancer Neg Hx    Bladder Cancer Neg Hx    Colon cancer Neg Hx       Prior to Admission medications   Medication Sig Start Date End Date Taking? Authorizing Provider  ACCU-CHEK AVIVA PLUS test strip USE AS INSTRUCTED TO CHECK BLOOD SUGAR 3 TIMES DAILY OR AS NEEDED 03/16/21   [name], MD  ACCU-CHEK SOFTCLIX LANCETS lancets Use as instructed to check blood sugar three times daily and as needed.  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 05/16/17   [name], MD  acetaminophen (TYLENOL) 500 MG tablet Take 1,000 mg by mouth every 6 (six) hours as needed.    [provider]  albuterol (VENTOLIN HFA) 108 (90 Base) MCG/ACT inhaler INHALE 2 PUFFS INTO THE LUNGS EVERY 6 HOURS AS NEEDED FOR WHEEZING OR SHORTNESS OF BREATH. DISPENSE 3 INHALERS 11/01/20   [name], MD  Alcohol Swabs (B-D SINGLE USE SWABS  REGULAR) PADS USE TO CLEANS AREA PRIOR TO CHECKING BLOOD SUGAR THREE TIMES DAILY AND AS NEEDED 09/29/20   [name], MD  aspirin EC 81 MG tablet Take 1 tablet (81 mg total) by mouth daily. Swallow whole. 11/25/20   [name], MD  atorvastatin (LIPITOR) 80 MG tablet TAKE 1 TABLET AT BEDTIME 01/12/21   [name], MD  Blood Glucose Monitoring Suppl (ACCU-CHEK AVIVA PLUS) w/Device KIT Use to check blood sugar three times daily and as needed.  Diagnosis: E11.22  Insulin-dependent 07/01/19   [name], MD  calcitRIOL (ROCALTROL) 0.25 MCG capsule Take 0.25 mcg by mouth daily.    [provider]  carvedilol (COREG) 25 MG tablet Take 1 tablet (25 mg total) by mouth 2 (two) times daily with a meal. 04/30/21   Leslye Peer, Richard, MD  cloNIDine (CATAPRES) 0.3 MG tablet TAKE 1 TABLET THREE TIMES DAILY 12/07/20   Minna Merritts, MD  clopidogrel (PLAVIX) 75 MG tablet TAKE 1 TABLET EVERY DAY 09/29/20   Loel Dubonnet, NP  dorzolamide-timolol (COSOPT) 22.3-6.8 MG/ML ophthalmic solution Place 1 drop into both eyes in the morning and at bedtime. 12/05/19   [provider]  doxycycline (VIBRA-TABS) 100 MG tablet Take 1 tablet (100 mg total) by mouth every 12 (twelve) hours for 6 days. 04/30/21 05/06/21  Loletha Grayer, MD  ezetimibe (ZETIA) 10 MG tablet TAKE 1 TABLET EVERY DAY 09/29/20   Minna Merritts, MD  fluticasone (FLONASE) 50 MCG/ACT nasal spray Place 2 sprays into both nostrils daily. 11/25/20   [name], MD  Fluticasone-Umeclidin-Vilant (TRELEGY ELLIPTA) 200-62.5-25 MCG/INH AEPB Inhale 1 puff into the lungs daily. 04/05/21   Tyler Pita, MD  gabapentin (NEURONTIN) 100 MG capsule Take 1 capsule (100 mg total) by mouth 2 (two) times daily. 04/30/21   Loletha Grayer, MD  hydrALAZINE (APRESOLINE) 100 MG tablet Take 1 tablet (100 mg total) by mouth 3 (three) times daily. 04/30/21   Loletha Grayer, MD  HYDROcodone-acetaminophen (NORCO/VICODIN) 5-325 MG  tablet Take 1 tablet by mouth every 8 (eight) hours as needed for up to 3 days for severe pain. 04/30/21 05/03/21  Loletha Grayer, MD  insulin aspart (NOVOLOG) 100 UNIT/ML FlexPen Inject 8 Units into the skin 3 (three) times daily with meals. (Okay to substitute generic or change to another short acting insulin that is covered) 04/30/21   Loletha Grayer, MD  insulin detemir (LEVEMIR) 100 UNIT/ML FlexPen Inject 30 Units into the skin at bedtime. 04/30/21   Loletha Grayer, MD  Insulin Pen Needle 34G X 3.5 MM MISC 1 Dose by Does not apply route 3 (three) times daily. 04/30/21   Loletha Grayer, MD  isosorbide mononitrate (IMDUR) 120 MG 24 hr tablet Take 1 tablet (120 mg total) by mouth daily. 05/01/21   Loletha Grayer, MD  levothyroxine (SYNTHROID) 25 MCG tablet take 2 of the 25 mcg tablets Monday through Saturday with 1 tablet on Sunday.  13 pills/week. 02/23/21   [name], MD  montelukast (SINGULAIR) 10 MG tablet Take 1 tablet by mouth once daily 04/05/21   Tyler Pita, MD  nitroGLYCERIN (NITROSTAT) 0.4 MG SL tablet Place 1 tablet (0.4 mg total) under the tongue every 5 (five) minutes as needed. May repeat x3 03/27/19   [name], MD  omeprazole (PRILOSEC) 20 MG capsule Take 20 mg by mouth daily before breakfast.    [provider]  polyethylene glycol (MIRALAX / GLYCOLAX) 17 g packet Take 17 g by mouth daily as needed for moderate constipation. 04/30/21   Loletha Grayer, MD  sodium chloride (OCEAN) 0.65 % SOLN nasal spray Place 1 spray into both nostrils as needed for congestion. 04/30/21   Loletha Grayer, MD  tamsulosin (FLOMAX) 0.4 MG CAPS capsule TAKE 1 CAPSULE AT BEDTIME 01/12/21   [name], MD  torsemide (DEMADEX) 20 MG tablet Take 2 tablets (40 mg) by mouth once daily 12/14/20   Marrianne Mood D, PA-C  vitamin B-12 1000 MCG tablet Take 1 tablet (1,000 mcg total) by mouth daily. 05/01/21   Loletha Grayer, MD    Physical Exam: Vitals:    04/07/2021 6644 04/30/2021 0211 04/15/2021 0347 04/24/2021 4259  BP:   (!) 123/53 128/62  Pulse: 60  (!) 59 (!) 58  Resp: (!) 22  (!) 22 18  Temp: 98.3 F (36.8 C)  98.4 F (36.9 C) 98.4 F (36.9 C)  TempSrc: Oral   Oral  SpO2: 98%  95% 99%  Weight:  114.3 kg    Height:  $Remove'5\' 5"'jlKwJrz$  (1.651 m)     Constitutional:Awake, chronically ill appearing and oriented x 3 . Mild conversational dyspnea HEENT:      Head: Normocephalic and atraumatic.         Eyes: PERLA, EOMI, Conjunctivae are normal. Sclera is non-icteric.       Mouth/Throat: Mucous membranes are moist.       Neck: Supple with no signs of meningismus. Cardiovascular: Regular rate and rhythm. No murmurs, gallops, or rubs. 2+ symmetrical distal pulses are present . No JVD. 3+LE edema Respiratory: Respiratory effort increased .Lungs sounds diminished bilaterally.  Gastrointestinal: Soft, non tender, and non distended with positive bowel sounds.  Genitourinary: No CVA tenderness. Musculoskeletal: Nontender with normal range of motion in all extremities. No cyanosis, or erythema of extremities. Neurologic:  Face is symmetric. Moving all extremities. No gross focal neurologic deficits . Skin: Skin is warm, dry.  No rash or ulcers Psychiatric: Mood and affect are normal   Labs on Admission: I have personally reviewed following labs and imaging studies  CBC: Recent Labs  Lab 04/25/21 0741 04/26/21 0436 04/27/21 0446 04/10/2021 0213  WBC 10.3 10.3 10.9* 11.6*  NEUTROABS  --   --   --  9.8*  HGB 10.4* 9.7* 9.8* 9.7*  HCT 32.5* 30.4* 29.8* 29.8*  MCV 87.8 87.1 85.9 87.6  PLT 174 184 214 485   Basic Metabolic Panel: Recent Labs  Lab 04/27/21 0446 04/28/21 1110 04/29/21 0839 04/30/21 0632 04/14/2021 0213  NA 132* 133* 134* 134* 135  K 4.6 4.2 4.0 3.8 4.5  CL 98 98 99 100 99  CO2 $Re'25 26 27 28 28  'OUI$ GLUCOSE 213* 127* 174* 150* 241*  BUN 86* 81* 85* 84* 72*  CREATININE 2.84* 3.24* 3.45* 3.03* 3.12*  CALCIUM 8.2* 8.2* 8.0* 8.0* 8.2*    GFR: Estimated Creatinine Clearance: 24.6 mL/min (A) (by C-G formula based on SCr of 3.12 mg/dL (H)). Liver Function Tests: No results for input(s): AST, ALT, ALKPHOS, BILITOT, PROT, ALBUMIN in the last 168 hours. No results for input(s): LIPASE, AMYLASE in the last 168 hours. No results for input(s): AMMONIA in the last 168 hours. Coagulation Profile: No results for input(s): INR, PROTIME in the last 168 hours. Cardiac Enzymes: No results for input(s): CKTOTAL, CKMB, CKMBINDEX, TROPONINI in the last 168 hours. BNP (last 3 results) No results for input(s): PROBNP in the last 8760 hours. HbA1C: No results for input(s): HGBA1C in the last 72 hours. CBG: Recent Labs  Lab 04/29/21 2331 04/30/21 0135 04/30/21 0808 04/30/21 1112 04/30/21 1644  GLUCAP 141* 113* 129* 150* 222*   Lipid Profile: No results for input(s): CHOL, HDL, LDLCALC, TRIG, CHOLHDL, LDLDIRECT in the last 72 hours. Thyroid Function Tests: No results for input(s): TSH, T4TOTAL, FREET4, T3FREE, THYROIDAB in the last 72 hours. Anemia Panel: No results for input(s): VITAMINB12, FOLATE, FERRITIN, TIBC, IRON, RETICCTPCT in the last 72 hours. Urine analysis:    Component Value Date/Time   COLORURINE YELLOW (A) 04/22/2021 1153   APPEARANCEUR HAZY (A) 04/22/2021 1153   LABSPEC 1.013 04/22/2021 1153   PHURINE 5.0 04/22/2021 1153   GLUCOSEU >=500 (A) 04/22/2021 1153   HGBUR NEGATIVE 04/22/2021  [place] 04/22/2021 1153   BILIRUBINUR neg 12/06/2010 [place] 04/22/2021 1153   PROTEINUR 100 (A) 04/22/2021 1153   UROBILINOGEN 0.2 12/06/2010 1627   NITRITE NEGATIVE 04/22/2021 [place] 04/22/2021 1153    Radiological Exams on Admission: DG Chest Port 1 View  Result Date: 04/17/2021 CLINICAL DATA:  Shortness of breath EXAM: PORTABLE CHEST 1 VIEW COMPARISON:  Chest radiograph dated 04/26/2021. CT chest dated 04/05/2021. FINDINGS: Lower lobe predominant  scarring/fibrosis. When correlating with prior CT, this is compatible with chronic interstitial lung disease. No definite pleural effusions. No pneumothorax. Cardiomegaly.  Thoracic aortic atherosclerosis. IMPRESSION: Lower lobe predominant scarring/fibrosis, corresponding to suspected chronic interstitial lung disease on CT. Electronically Signed   By: Julian Hy M.D.   On: 04/20/2021 02:36    Assessment/Plan Active Problems:   Unstable angina (HCC)   NSTEMI/unstable angina CAD (coronary artery disease) - Patient presents with chest pain, troponin of 1400> 1500 (troponin had peaked at 2100 recent hospitalization) - Left heart cath could not be performed due to poor baseline renal function - EKG nonacute - Continue heparin infusion - Continue Plavix and atorvastatin, Coreg and Imdur - Cardiology consult  Acute on chronic respiratory failure with hypoxemia  - Patient presenting in acute respiratory distress, and hypoxia on home O2 flow rate - Etiology related to  CHF exacerbation superimposed on chronic underlying pulmonary conditions including ILD, OSA, PAH - Treat acute issues as outlined below    Acute on chronic diastolic CHF  -Shortness of breath, with BNP over thousand. -Echo 04/18/2021 EF 60 to 65% - IV Lasix - Continue Imdur, carvedilol  - Daily weights with intake and output monitoring     COPD    Pulmonary hypertension, primary (HCC)   ILD (interstitial lung disease) (HCC)   OSA - Patient recently completed a course of steroids during recent past hospitalization - Last seen by pulmonology in August 2022 --Nighttime Bipap     CKD (chronic kidney disease) stage 4,  -creatinine  at baseline     Type 2 diabetes mellitus with renal complication (HCC) -Sliding scale insulin coverage    DVT prophylaxis: heparin infusion  Code Status: full code Family Communication:  wife at bedside Disposition Plan: Back to previous home environment Consults called:  cardiology Status:At the time of admission, it appears that the appropriate admission status for this patient is INPATIENT. This is judged to be reasonable and necessary in order to provide the required intensity of service to ensure the patient's safety given the presenting symptoms, physical exam findings, and initial radiographic and laboratory data in the context of their  Comorbid conditions.   Patient requires inpatient status due to high intensity of service, high risk for further deterioration and high frequency of surveillance required.   I certify that at the point of admission it is my clinical judgment that the patient will require inpatient hospital care spanning beyond [place] MD [facility]   04/03/2021, 4:58 AM

## 2021-05-02 NOTE — ED Notes (Addendum)
1,417 trop called from lab to this RN - MD notified

## 2021-05-02 NOTE — ED Triage Notes (Signed)
BIB [facility] for SOB and CP. 90% on RA for FD. 99% on NRB.  Pt wears O2 at home at baseline. Pt has edema to lower legs but chronic.   245BGL 18 LA

## 2021-05-02 NOTE — Progress Notes (Signed)
ANTICOAGULATION CONSULT NOTE - Initial Consult  Pharmacy Consult for  Heparin  Indication: ACS/NSTEMI   Allergies  Allergen Reactions   Calcium Channel Blockers     Would avoid if possible due to h/o peripheral edema    Patient Measurements: Height: 5\' 5"  (165.1 cm) Weight: 114.3 kg (252 lb) IBW/kg (Calculated) : 61.5 Heparin Dosing Weight: 88.1 kg   Vital Signs: Temp: 98.4 F (36.9 C) (10/31 0344) Temp Source: Oral (10/31 0344) BP: 133/61 (10/31 1200) Pulse Rate: 56 (10/31 1200)  Labs: Recent Labs    04/30/21 5784 04/10/2021 0213 04/02/2021 0344 05/01/2021 0447 04/23/2021 1258  HGB  --  9.7*  --   --   --   HCT  --  29.8*  --   --   --   PLT  --  332  --   --   --   APTT  --   --   --  31  --   LABPROT  --   --   --  15.0  --   INR  --   --   --  1.2  --   HEPARINUNFRC  --   --   --   --  0.46  CREATININE 3.03* 3.12*  --   --   --   TROPONINIHS  --  1,417* 1,514*  --   --      Estimated Creatinine Clearance: 24.6 mL/min (A) (by C-G formula based on SCr of 3.12 mg/dL (H)).   Medical History: Past Medical History:  Diagnosis Date   Adrenal gland anomaly    Arthritis    CAD (coronary artery disease)    a. 04/2006 MI and PCI/stenting to mLCx & mRCA; b. 07/2009 Cath: patent LCX/RCA stents;  c. 12/2016 NSTEMI/Cath: LM 30, LAD 30p, 54m, 30d, D1/2/3 min irregs, LCX 143m ISR, OM1 min irregs, RCA 20p, 10 ISR, 48m, 50d, RPDA min irregs, RPAV 40-->Med Rx; d. 12/2017 MV: fixed lateral wall scar, mild anterior/anterior septal ischemia. EF 30-44%.   CHF (congestive heart failure) (HCC)    CKD (chronic kidney disease), stage III (HCC)    Colon polyps    DM2 (diabetes mellitus, type 2) (HCC)    insulin requiring   Dyslipidemia    GERD (gastroesophageal reflux disease)    History of echocardiogram    a. TTE 12/2016: EF 50-55%, mod concentric LVH, images inadequate for wall motion assessment, not technically sufficienct to allow for LV dias fxn, calcified mitral annulus    HTN  (hypertension)    Kidney stones    Left Renal artery stenosis (HCC)    a. 07/2013 s/p PTA/stenting ([facility]); b. 07/2017 Renal Duplex: No significant RAS.   Myocardial infarction (HCC)    OSA (obstructive sleep apnea)    Pulmonary fibrosis (HCC)    a. 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   Recurrent UTI    Rotator cuff disorder    right   Skin cancer    head   Thyroid disorder    Venous insufficiency of both lower extremities    a. s/p laser treatment.     Assessment: Pharmacy consulted to dose heparin in this 75 year old male admitted with ACS/NSTEMI.  No prior anticoag noted.   Goal of Therapy:  Heparin level 0.3-0.7 units/ml Monitor platelets by anticoagulation protocol: Yes   Plan:  HL therapeutic. Continue heparin infusion at 1200 units/hr Check anti-Xa level in 8 hours Continue to monitor H&H and platelets  Lelon Mast  O Liticia Gasior 05/01/2021,1:54 PM

## 2021-05-02 NOTE — ED Notes (Signed)
Pt noticed drop into 70's-86% Spo2. Pt request bipap. RT and MD aware

## 2021-05-02 NOTE — ED Notes (Signed)
Herparin drip checked. IV checked

## 2021-05-02 NOTE — Consult Note (Signed)
Cardiology Consultation:   Patient ID: DASMINE TROEGER MRN: [id number]; DOB: Aug 26, 1945  Admit date: 06/01/2021 Date of Consult: 05/29/2021  PCP:  [name], MD   [facility] Providers Cardiologist:  Julien Nordmann, MD      Patient Profile:   CHIKA WEB is a 75 y.o. 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 Para March.  History of Present Illness:   Mr. Davydov hospitalized earlier this month for chest pain and shortness of breath felt to be due to acute on chronic HFpEF with demand ischemia as well as superimposed flare of his chronic interstitial lung disease.  He was diuresed with improvement in dyspnea but some acute kidney injury.  His hospitalization was complicated by altered mental status with metabolic encephalopathy due to UTI as well as acute on chronic hypoxic and hypercapnic respiratory failure.  He was discharged home 2 days ago and was feeling relatively well.  However, yesterday he had some generalized malaise beyond his typical fatigue.  He awoke around 130 this morning acutely short of breath with tightness across his chest worsened by deep inspiration.  He was concerned that he was not receiving adequate oxygen through his BiPAP machine and had his wife switch him over to supplemental oxygen.  His dyspnea persisted, prompting his wife to call EMS.  He took sublingual nitroglycerin without any improvement in his symptoms.  In the ED, he was not hypoxic.  He was given furosemide 40 mg IV x2 and reports that he might be feeling a little better.  He still has chest discomfort and shortness of breath.  He is unsure of leg swelling.  He had  been compliant with his medications at home since discharge and was following a low-sodium diet.   Past Medical History:  Diagnosis Date   Adrenal gland anomaly    Arthritis    CAD (coronary artery disease)    a. 04/2006 MI and PCI/stenting to mLCx & mRCA; b. 07/2009 Cath: patent LCX/RCA stents;  c. 12/2016 NSTEMI/Cath: LM 30, LAD 30p, 65m, 30d, D1/2/3 min irregs, LCX 112m ISR, OM1 min irregs, RCA 20p, 10 ISR, 46m, 50d, RPDA min irregs, RPAV 40-->Med Rx; d. 12/2017 MV: fixed lateral wall scar, mild anterior/anterior septal ischemia. EF 30-44%.   CHF (congestive heart failure) (HCC)    CKD (chronic kidney disease), stage III (HCC)    Colon polyps    DM2 (diabetes mellitus, type 2) (HCC)    insulin requiring   Dyslipidemia    GERD (gastroesophageal reflux disease)    History of echocardiogram    a. TTE 12/2016: EF 50-55%, mod concentric LVH, images inadequate for wall motion assessment, not technically sufficienct to allow for LV dias fxn, calcified mitral annulus    HTN (hypertension)    Kidney stones    Left Renal artery stenosis (HCC)    a. 07/2013 s/p PTA/stenting ([facility]); b. 07/2017 Renal Duplex: No significant RAS.   Myocardial infarction (HCC)    OSA (obstructive sleep apnea)    Pulmonary fibrosis (HCC)    a. 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   Recurrent UTI    Rotator cuff disorder    right   Skin cancer  head   Thyroid disorder    Venous insufficiency of both lower extremities    a. s/p laser treatment.    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   CARPAL TUNNEL RELEASE     bilateral   CATARACT EXTRACTION W/PHACO Right 07/08/2018   Procedure: CATARACT EXTRACTION PHACO AND INTRAOCULAR LENS PLACEMENT (IOC) RIGHT DIABETIC;  Surgeon: Nevada Crane, MD;  Location: [facility];  Service: Ophthalmology;  Laterality: Right;  Diabetic - insulin sleep apnea    CATARACT EXTRACTION W/PHACO Left 08/05/2018   Procedure: CATARACT EXTRACTION PHACO AND INTRAOCULAR LENS PLACEMENT (IOC) LEFT DIABETIC;  Surgeon: Nevada Crane, MD;  Location: [facility];  Service: Ophthalmology;  Laterality: Left;  DIABETIC   CORONARY ANGIOGRAPHY N/A 08/19/2020   Procedure: CORONARY ANGIOGRAPHY;  Surgeon: Antonieta Iba, MD;  Location: [facility];  Service: Cardiovascular;  Laterality: N/A;   CORONARY ANGIOPLASTY WITH STENT PLACEMENT  2007   x 2   KNEE SURGERY     right   LEFT HEART CATH AND CORONARY ANGIOGRAPHY N/A 01/11/2017   Procedure: Left Heart Cath and Coronary Angiography;  Surgeon: Iran Ouch, MD;  Location: [facility];  Service: Cardiovascular;  Laterality: N/A;   LUNG BIOPSY     RENAL ARTERY STENT  2015   L   RIGHT HEART CATH Right 12/06/2020   Procedure: RIGHT HEART CATH;  Surgeon: Iran Ouch, MD;  Location: [facility];  Service: Cardiovascular;  Laterality: Right;   SKIN CANCER EXCISION     back of head     Home Medications:  Prior to Admission medications   Medication Sig Start Date Aloysius Heinle Date Taking? Authorizing Provider  acetaminophen (TYLENOL) 500 MG tablet Take 1,000 mg by mouth every 6 (six) hours as needed.   Yes [provider]  aspirin EC 81 MG tablet Take 1 tablet (81 mg total) by mouth daily. Swallow whole. 11/25/20  Yes [name], MD  atorvastatin (LIPITOR) 80 MG tablet TAKE 1 TABLET AT BEDTIME 01/12/21  Yes [name], MD  calcitRIOL (ROCALTROL) 0.25 MCG capsule Take 0.25 mcg by mouth daily.   Yes [provider]  carvedilol (COREG) 25 MG tablet Take 1 tablet (25 mg total) by mouth 2 (two) times daily with a meal. 04/30/21  Yes Wieting, Richard, MD  cloNIDine (CATAPRES) 0.3 MG tablet TAKE 1 TABLET THREE TIMES DAILY 12/07/20  Yes Gollan, Tollie Pizza, MD  clopidogrel (PLAVIX) 75 MG tablet TAKE 1 TABLET EVERY DAY 09/29/20  Yes Alver Sorrow, NP  dorzolamide-timolol  (COSOPT) 22.3-6.8 MG/ML ophthalmic solution Place 1 drop into both eyes in the morning and at bedtime. 12/05/19  Yes [provider]  doxycycline (VIBRA-TABS) 100 MG tablet Take 1 tablet (100 mg total) by mouth every 12 (twelve) hours for 6 days. 04/30/21 05/06/21 Yes Wieting, Richard, MD  ezetimibe (ZETIA) 10 MG tablet TAKE 1 TABLET EVERY DAY 09/29/20  Yes Gollan, Tollie Pizza, MD  Fluticasone-Umeclidin-Vilant (TRELEGY ELLIPTA) 200-62.5-25 MCG/INH AEPB Inhale 1 puff into the lungs daily. 04/05/21  Yes Salena Saner, MD  gabapentin (NEURONTIN) 100 MG capsule Take 1 capsule (100 mg total) by mouth 2 (two) times daily. 04/30/21  Yes Wieting, Richard, MD  hydrALAZINE (APRESOLINE) 100 MG tablet Take 1 tablet (100 mg total) by mouth 3 (three) times daily. 04/30/21  Yes Wieting, Richard, MD  HYDROcodone-acetaminophen (NORCO/VICODIN) 5-325 MG tablet Take 1 tablet by  mouth every 8 (eight) hours as needed for up to 3 days for severe pain. 04/30/21 05/03/21 Yes Wieting, Richard, MD  insulin aspart (NOVOLOG) 100 UNIT/ML FlexPen Inject 8 Units into the skin 3 (three) times daily with meals. (Okay to substitute generic or change to another short acting insulin that is covered) 04/30/21  Yes Wieting, Richard, MD  insulin detemir (LEVEMIR) 100 UNIT/ML FlexPen Inject 30 Units into the skin at bedtime. 04/30/21  Yes Wieting, Richard, MD  isosorbide mononitrate (IMDUR) 120 MG 24 hr tablet Take 1 tablet (120 mg total) by mouth daily. 05/01/21  Yes Alford Highland, MD  levothyroxine (SYNTHROID) 25 MCG tablet take 2 of the 25 mcg tablets Monday through Saturday with 1 tablet on Sunday.  13 pills/week. 02/23/21  Yes [name], MD  montelukast (SINGULAIR) 10 MG tablet Take 1 tablet by mouth once daily 04/05/21  Yes Salena Saner, MD  nitroGLYCERIN (NITROSTAT) 0.4 MG SL tablet Place 1 tablet (0.4 mg total) under the tongue every 5 (five) minutes as needed. May repeat x3 03/27/19  Yes [name], MD   omeprazole (PRILOSEC) 20 MG capsule Take 20 mg by mouth daily before breakfast.   Yes [provider]  tamsulosin (FLOMAX) 0.4 MG CAPS capsule TAKE 1 CAPSULE AT BEDTIME 01/12/21  Yes [name], MD  torsemide (DEMADEX) 20 MG tablet Take 2 tablets (40 mg) by mouth once daily 12/14/20  Yes Visser, Jacquelyn D, PA-C  vitamin B-12 1000 MCG tablet Take 1 tablet (1,000 mcg total) by mouth daily. 05/01/21  Yes Wieting, Richard, MD  ACCU-CHEK AVIVA PLUS test strip USE AS INSTRUCTED TO CHECK BLOOD SUGAR 3 TIMES DAILY OR AS NEEDED 03/16/21   [name], MD  ACCU-CHEK SOFTCLIX LANCETS lancets Use as instructed to check blood sugar three times daily and as needed.  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 05/16/17   [name], MD  albuterol (VENTOLIN HFA) 108 (90 Base) MCG/ACT inhaler INHALE 2 PUFFS INTO THE LUNGS EVERY 6 HOURS AS NEEDED FOR WHEEZING OR SHORTNESS OF BREATH. DISPENSE 3 INHALERS 11/01/20   [name], MD  Alcohol Swabs (B-D SINGLE USE SWABS REGULAR) PADS USE TO CLEANS AREA PRIOR TO CHECKING BLOOD SUGAR THREE TIMES DAILY AND AS NEEDED 09/29/20   [name], MD  Blood Glucose Monitoring Suppl (ACCU-CHEK AVIVA PLUS) w/Device KIT Use to check blood sugar three times daily and as needed.  Diagnosis: E11.22  Insulin-dependent 07/01/19   [name], MD  fluticasone [facility]) 50 MCG/ACT nasal spray Place 2 sprays into both nostrils daily. 11/25/20   [name], MD  Insulin Pen Needle 34G X 3.5 MM MISC 1 Dose by Does not apply route 3 (three) times daily. 04/30/21   Alford Highland, MD  polyethylene glycol (MIRALAX / GLYCOLAX) 17 g packet Take 17 g by mouth daily as needed for moderate constipation. 04/30/21   Alford Highland, MD  sodium chloride (OCEAN) 0.65 % SOLN nasal spray Place 1 spray into both nostrils as needed for congestion. 04/30/21   Alford Highland, MD    Inpatient Medications: Scheduled Meds:  [START ON 05/03/2021] aspirin EC  81 mg Oral Daily    atorvastatin  80 mg Oral QHS   calcitRIOL  0.25 mcg Oral Daily   carvedilol  25 mg Oral BID WC   clopidogrel  75 mg Oral Daily   dorzolamide-timolol  1 drop Both Eyes BID   doxycycline  100 mg Oral Q12H   ezetimibe  10 mg  Oral Daily   fluticasone furoate-vilanterol  1 puff Inhalation Daily   And   umeclidinium bromide  1 puff Inhalation Daily   furosemide  40 mg Intravenous BID   gabapentin  100 mg Oral BID   insulin aspart  0-20 Units Subcutaneous TID WC   insulin aspart  0-5 Units Subcutaneous QHS   insulin detemir  30 Units Subcutaneous QHS   isosorbide mononitrate  120 mg Oral Daily   levothyroxine  50 mcg Oral Q0600   montelukast  10 mg Oral Daily   pantoprazole  40 mg Oral Daily   tamsulosin  0.4 mg Oral QHS   cyanocobalamin  1,000 mcg Oral Daily   Continuous Infusions:  heparin 1,200 Units/hr (04/08/2021 0512)   PRN Meds: acetaminophen, albuterol, HYDROcodone-acetaminophen, nitroGLYCERIN, ondansetron (ZOFRAN) IV, polyethylene glycol  Allergies:    Allergies  Allergen Reactions   Calcium Channel Blockers     Would avoid if possible due to h/o peripheral edema    Social History:   Social History   Tobacco Use   Smoking status: Never   Smokeless tobacco: Never  Vaping Use   Vaping Use: Never used  Substance Use Topics   Alcohol use: No    Alcohol/week: 0.0 standard drinks   Drug use: No     Family History:    Family History  Problem Relation Age of Onset   Heart attack Father    Heart attack Mother    Heart attack Brother    COPD Sister    Arthritis Maternal Grandmother    Breast cancer Maternal Aunt    Diabetes Maternal Aunt    Hypertension Other    Hypertension Maternal Aunt    Prostate cancer Neg Hx    Kidney cancer Neg Hx    Bladder Cancer Neg Hx    Colon cancer Neg Hx      ROS:  Please see the history of present illness. All other ROS reviewed and negative.     Physical Exam/Data:   Vitals:   04/24/2021 0244 04/21/2021 0344 04/03/2021 0800  04/08/2021 1200  BP: (!) 123/53 128/62 (!) 145/58 133/61  Pulse: (!) 59 (!) 58 (!) 59 (!) 56  Resp: (!) 22 18 18 18   Temp: 98.4 F (36.9 C) 98.4 F (36.9 C)    TempSrc:  Oral    SpO2: 95% 99% 99% 99%  Weight:      Height:       No intake or output data in the 24 hours ending 04/23/2021 1307 Last 3 Weights 04/06/2021 04/30/2021 04/29/2021  Weight (lbs) 252 lb 270 lb 8.1 oz 272 lb 14.9 oz  Weight (kg) 114.306 kg 122.7 kg 123.8 kg  Some encounter information is confidential and restricted. Go to Review Flowsheets activity to see all data.     Body mass index is 41.93 kg/m.  General: Chronically ill-appearing man lying in bed.  Wife is at the bedside. HEENT: normal Neck: Unable to assess JVP due to body habitus. Vascular: No carotid bruits; 1+ radial pulses bilaterally. Cardiac: Regular rate and rhythm with soft 3/6 systolic murmur. Lungs: Normal work of breathing.  Bilateral crackles in the lower halves of both lungs. Abd: soft, nontender, no hepatomegaly  Ext: 2+ pitting edema to the midcalf in both legs. Musculoskeletal:  No deformities, BUE and BLE strength normal and equal Skin: warm and dry  Neuro:  CNs 2-12 intact, no focal abnormalities noted Psych:  Normal affect   EKG:  The EKG was personally reviewed and demonstrates:  Sinus bradycardia with left bundle branch block Telemetry:  Telemetry was personally reviewed and demonstrates: Sinus rhythm with PVCs.  Relevant CV Studies:  1. Left ventricular ejection fraction, by estimation, is 60 to 65%. The  left ventricle has normal function. Left ventricular endocardial border  not optimally defined to evaluate regional wall motion. The left  ventricular internal cavity size was mildly  dilated. There is mild left ventricular hypertrophy. Left ventricular  diastolic parameters are consistent with Grade I diastolic dysfunction  (impaired relaxation). Elevated left atrial pressure.   2. Right ventricular systolic function is normal.  The right ventricular  size is not well visualized.   3. The mitral valve is abnormal. Trivial mitral valve regurgitation. Mild  mitral stenosis. The mean mitral valve gradient is 5.0 mmHg.   4. Tricuspid valve regurgitation not well assessed.   5. The aortic valve has an indeterminant number of cusps. There is mild  calcification of the aortic valve. There is moderate thickening of the  aortic valve. Aortic valve regurgitation is trivial. Mild to moderate  aortic valve sclerosis/calcification is  present, without any evidence of aortic stenosis.   RHC (12/06/2020): RA: 13 mmHg PCW: 20 mmHg with a V wave of 27 mmHg PA: 44/ 18 with a mean of 30 mmHg.  Pulmonary vascular resistance is only 1.14 Woods unit RV: 48 /12 mmHg PA sat is 97% with cardiac output of 8.79 and cardiac index of 3.98.  LHC (08/19/2020): Diagnostic Dominance: Right   Laboratory Data:  High Sensitivity Troponin:   Recent Labs  Lab 04/18/21 0505 04/18/21 0658 04/18/21 1240 04/25/2021 0213 04/25/2021 0344  TROPONINIHS 2,129* 1,997* 1,842* 1,417* 1,514*     Chemistry Recent Labs  Lab 04/29/21 0839 04/30/21 0632 04/29/2021 0213  NA 134* 134* 135  K 4.0 3.8 4.5  CL 99 100 99  CO2 27 28 28   GLUCOSE 174* 150* 241*  BUN 85* 84* 72*  CREATININE 3.45* 3.03* 3.12*  CALCIUM 8.0* 8.0* 8.2*  GFRNONAA 18* 21* 20*  ANIONGAP 8 6 8     No results for input(s): PROT, ALBUMIN, AST, ALT, ALKPHOS, BILITOT in the last 168 hours. Lipids No results for input(s): CHOL, TRIG, HDL, LABVLDL, LDLCALC, CHOLHDL in the last 168 hours.  Hematology Recent Labs  Lab 04/26/21 0436 04/27/21 0446 04/15/2021 0213  WBC 10.3 10.9* 11.6*  RBC 3.49* 3.47* 3.40*  HGB 9.7* 9.8* 9.7*  HCT 30.4* 29.8* 29.8*  MCV 87.1 85.9 87.6  MCH 27.8 28.2 28.5  MCHC 31.9 32.9 32.6  RDW 15.8* 15.7* 15.9*  PLT 184 214 332   Thyroid No results for input(s): TSH, FREET4 in the last 168 hours.  BNP Recent Labs  Lab 04/10/2021 0214  BNP 1,205.9*     DDimer No results for input(s): DDIMER in the last 168 hours.   Radiology/Studies:  DG Chest Port 1 View  Result Date: 04/09/2021 CLINICAL DATA:  Shortness of breath EXAM: PORTABLE CHEST 1 VIEW COMPARISON:  Chest radiograph dated 04/26/2021. CT chest dated 04/05/2021. FINDINGS: Lower lobe predominant scarring/fibrosis. When correlating with prior CT, this is compatible with chronic interstitial lung disease. No definite pleural effusions. No pneumothorax. Cardiomegaly.  Thoracic aortic atherosclerosis. IMPRESSION: Lower lobe predominant scarring/fibrosis, corresponding to suspected chronic interstitial lung disease on CT. Electronically Signed   By: Charline Bills M.D.   On: 04/17/2021 02:36     Assessment and Plan:   Acute on chronic HFpEF and pulmonary hypertension: Unfortunately, Mr. Loreta Ave returns with worsening shortness of breath and associated pleuritic chest  pain in the setting of recent hospitalization for similar symptoms.  Medical therapy during the hospital stay was quite challenging due to a narrow therapeutic window regarding diuresis.  He appears volume overloaded on exam today, and we may need to tolerate some rise in his creatinine to achieve a more euvolemic state.  I worry that some of his edema is driven primarily by right heart failure in the setting of pulmonary fibrosis.  I have advised him and his wife that our long-term treatment options are quite limited and that palliative care consultation may be helpful to control symptoms. -Continue furosemide 40 mg IV twice daily. -I agree with palliative care consultation.  Coronary artery disease with demand ischemia: I suspect that chest pain is likely related to pulmonary hypertension and heart failure with a component of demand ischemia in the setting of moderate to severe multivessel CAD by catheterization earlier this year.  Troponin again noted to be elevated but relatively flat.  I do not think Mr. Wark would be a  good candidate for cardiac catheterization at this time, as he would have a difficult time lying flat on account of his shortness of breath.  Additionally, his advanced chronic kidney disease places him at high risk for contrast-induced nephropathy.  He would not do well with dialysis in the light of his comorbidities. -Continue carvedilol, isosorbide mononitrate, aspirin, and clopidogrel. -Secondary prevention with atorvastatin and ezetimibe. -Okay to continue IV heparin for now though I would have a low threshold for stopping it at or before 48 hours.  Hypertension: Blood pressure reasonably well controlled. -Continue current regimen of hydralazine, isosorbide mononitrate, carvedilol, and clonidine. -Defer ACE inhibitor/ARB in the setting of chronic kidney disease stage IV.  Chronic kidney disease: -Diurese as above. -Avoid nephrotoxic agents.   For questions or updates, please contact [facility] Please consult [email or web address] for contact info under [facility].  Signed, Yvonne Kendall, MD  May 08, 2021 1:07 PM

## 2021-05-02 NOTE — Progress Notes (Signed)
[facility]) Hospital Liaison note:  Notified of request for [place] services. Will continue to follow for disposition.  Please call with any outpatient palliative questions or concerns.  Thank you for the opportunity to participate in this patient's care.  Thank you, Lorelee Market, LPN [facility] Liaison [phone]

## 2021-05-03 ENCOUNTER — Ambulatory Visit: Payer: Medicare HMO | Admitting: Family

## 2021-05-03 DIAGNOSIS — L8990 Pressure ulcer of unspecified site, unspecified stage: Secondary | ICD-10-CM | POA: Diagnosis not present

## 2021-05-03 DIAGNOSIS — I8002 Phlebitis and thrombophlebitis of superficial vessels of left lower extremity: Secondary | ICD-10-CM

## 2021-05-03 DIAGNOSIS — E538 Deficiency of other specified B group vitamins: Secondary | ICD-10-CM | POA: Diagnosis not present

## 2021-05-03 DIAGNOSIS — J189 Pneumonia, unspecified organism: Secondary | ICD-10-CM | POA: Diagnosis not present

## 2021-05-03 DIAGNOSIS — J9611 Chronic respiratory failure with hypoxia: Secondary | ICD-10-CM

## 2021-05-03 DIAGNOSIS — J9622 Acute and chronic respiratory failure with hypercapnia: Secondary | ICD-10-CM | POA: Diagnosis not present

## 2021-05-03 DIAGNOSIS — N170 Acute kidney failure with tubular necrosis: Secondary | ICD-10-CM | POA: Diagnosis present

## 2021-05-03 DIAGNOSIS — G9340 Encephalopathy, unspecified: Secondary | ICD-10-CM | POA: Diagnosis not present

## 2021-05-03 DIAGNOSIS — R778 Other specified abnormalities of plasma proteins: Secondary | ICD-10-CM | POA: Diagnosis not present

## 2021-05-03 DIAGNOSIS — J9621 Acute and chronic respiratory failure with hypoxia: Secondary | ICD-10-CM | POA: Diagnosis present

## 2021-05-03 DIAGNOSIS — U071 COVID-19: Secondary | ICD-10-CM | POA: Diagnosis not present

## 2021-05-03 DIAGNOSIS — L89322 Pressure ulcer of left buttock, stage 2: Secondary | ICD-10-CM | POA: Diagnosis present

## 2021-05-03 DIAGNOSIS — G9341 Metabolic encephalopathy: Secondary | ICD-10-CM | POA: Diagnosis present

## 2021-05-03 DIAGNOSIS — N179 Acute kidney failure, unspecified: Secondary | ICD-10-CM

## 2021-05-03 DIAGNOSIS — R531 Weakness: Secondary | ICD-10-CM | POA: Diagnosis not present

## 2021-05-03 DIAGNOSIS — I130 Hypertensive heart and chronic kidney disease with heart failure and stage 1 through stage 4 chronic kidney disease, or unspecified chronic kidney disease: Secondary | ICD-10-CM | POA: Diagnosis present

## 2021-05-03 DIAGNOSIS — I200 Unstable angina: Secondary | ICD-10-CM | POA: Diagnosis present

## 2021-05-03 DIAGNOSIS — R600 Localized edema: Secondary | ICD-10-CM | POA: Diagnosis not present

## 2021-05-03 DIAGNOSIS — R0789 Other chest pain: Secondary | ICD-10-CM | POA: Diagnosis not present

## 2021-05-03 DIAGNOSIS — E873 Alkalosis: Secondary | ICD-10-CM | POA: Diagnosis not present

## 2021-05-03 DIAGNOSIS — I248 Other forms of acute ischemic heart disease: Secondary | ICD-10-CM | POA: Diagnosis not present

## 2021-05-03 DIAGNOSIS — I25118 Atherosclerotic heart disease of native coronary artery with other forms of angina pectoris: Secondary | ICD-10-CM | POA: Diagnosis not present

## 2021-05-03 DIAGNOSIS — E039 Hypothyroidism, unspecified: Secondary | ICD-10-CM | POA: Diagnosis present

## 2021-05-03 DIAGNOSIS — E1151 Type 2 diabetes mellitus with diabetic peripheral angiopathy without gangrene: Secondary | ICD-10-CM | POA: Diagnosis present

## 2021-05-03 DIAGNOSIS — N189 Chronic kidney disease, unspecified: Secondary | ICD-10-CM

## 2021-05-03 DIAGNOSIS — R310 Gross hematuria: Secondary | ICD-10-CM | POA: Diagnosis not present

## 2021-05-03 DIAGNOSIS — J9602 Acute respiratory failure with hypercapnia: Secondary | ICD-10-CM | POA: Diagnosis not present

## 2021-05-03 DIAGNOSIS — D631 Anemia in chronic kidney disease: Secondary | ICD-10-CM | POA: Diagnosis not present

## 2021-05-03 DIAGNOSIS — N2581 Secondary hyperparathyroidism of renal origin: Secondary | ICD-10-CM | POA: Diagnosis not present

## 2021-05-03 DIAGNOSIS — R52 Pain, unspecified: Secondary | ICD-10-CM | POA: Diagnosis not present

## 2021-05-03 DIAGNOSIS — N184 Chronic kidney disease, stage 4 (severe): Secondary | ICD-10-CM | POA: Diagnosis present

## 2021-05-03 DIAGNOSIS — J440 Chronic obstructive pulmonary disease with acute lower respiratory infection: Secondary | ICD-10-CM | POA: Diagnosis present

## 2021-05-03 DIAGNOSIS — Z6841 Body Mass Index (BMI) 40.0 and over, adult: Secondary | ICD-10-CM | POA: Diagnosis not present

## 2021-05-03 DIAGNOSIS — J9601 Acute respiratory failure with hypoxia: Secondary | ICD-10-CM | POA: Diagnosis not present

## 2021-05-03 DIAGNOSIS — E1122 Type 2 diabetes mellitus with diabetic chronic kidney disease: Secondary | ICD-10-CM | POA: Diagnosis not present

## 2021-05-03 DIAGNOSIS — Z515 Encounter for palliative care: Secondary | ICD-10-CM | POA: Diagnosis not present

## 2021-05-03 DIAGNOSIS — I5032 Chronic diastolic (congestive) heart failure: Secondary | ICD-10-CM | POA: Diagnosis not present

## 2021-05-03 DIAGNOSIS — Z66 Do not resuscitate: Secondary | ICD-10-CM | POA: Diagnosis present

## 2021-05-03 DIAGNOSIS — I2721 Secondary pulmonary arterial hypertension: Secondary | ICD-10-CM | POA: Diagnosis present

## 2021-05-03 DIAGNOSIS — N186 End stage renal disease: Secondary | ICD-10-CM | POA: Diagnosis not present

## 2021-05-03 DIAGNOSIS — I214 Non-ST elevation (NSTEMI) myocardial infarction: Secondary | ICD-10-CM | POA: Diagnosis not present

## 2021-05-03 DIAGNOSIS — L89312 Pressure ulcer of right buttock, stage 2: Secondary | ICD-10-CM | POA: Diagnosis present

## 2021-05-03 DIAGNOSIS — I272 Pulmonary hypertension, unspecified: Secondary | ICD-10-CM | POA: Diagnosis not present

## 2021-05-03 DIAGNOSIS — Z7189 Other specified counseling: Secondary | ICD-10-CM | POA: Diagnosis not present

## 2021-05-03 DIAGNOSIS — R338 Other retention of urine: Secondary | ICD-10-CM | POA: Diagnosis not present

## 2021-05-03 DIAGNOSIS — J9600 Acute respiratory failure, unspecified whether with hypoxia or hypercapnia: Secondary | ICD-10-CM | POA: Diagnosis not present

## 2021-05-03 DIAGNOSIS — I5033 Acute on chronic diastolic (congestive) heart failure: Secondary | ICD-10-CM | POA: Diagnosis present

## 2021-05-03 DIAGNOSIS — J849 Interstitial pulmonary disease, unspecified: Secondary | ICD-10-CM | POA: Diagnosis not present

## 2021-05-03 DIAGNOSIS — Y846 Urinary catheterization as the cause of abnormal reaction of the patient, or of later complication, without mention of misadventure at the time of the procedure: Secondary | ICD-10-CM | POA: Diagnosis not present

## 2021-05-03 DIAGNOSIS — F321 Major depressive disorder, single episode, moderate: Secondary | ICD-10-CM | POA: Diagnosis not present

## 2021-05-03 DIAGNOSIS — I10 Essential (primary) hypertension: Secondary | ICD-10-CM | POA: Diagnosis not present

## 2021-05-03 DIAGNOSIS — Y95 Nosocomial condition: Secondary | ICD-10-CM | POA: Diagnosis not present

## 2021-05-03 LAB — CBC
HCT: 32 % — ABNORMAL LOW (ref 39.0–52.0)
Hemoglobin: 10.1 g/dL — ABNORMAL LOW (ref 13.0–17.0)
MCH: 27.7 pg (ref 26.0–34.0)
MCHC: 31.6 g/dL (ref 30.0–36.0)
MCV: 87.9 fL (ref 80.0–100.0)
Platelets: 302 10*3/uL (ref 150–400)
RBC: 3.64 MIL/uL — ABNORMAL LOW (ref 4.22–5.81)
RDW: 15.7 % — ABNORMAL HIGH (ref 11.5–15.5)
WBC: 11.2 10*3/uL — ABNORMAL HIGH (ref 4.0–10.5)
nRBC: 0 % (ref 0.0–0.2)

## 2021-05-03 LAB — BASIC METABOLIC PANEL
Anion gap: 11 (ref 5–15)
BUN: 67 mg/dL — ABNORMAL HIGH (ref 8–23)
CO2: 26 mmol/L (ref 22–32)
Calcium: 8 mg/dL — ABNORMAL LOW (ref 8.9–10.3)
Chloride: 99 mmol/L (ref 98–111)
Creatinine, Ser: 3.53 mg/dL — ABNORMAL HIGH (ref 0.61–1.24)
GFR, Estimated: 17 mL/min — ABNORMAL LOW (ref >60)
Glucose, Bld: 223 mg/dL — ABNORMAL HIGH (ref 70–99)
Potassium: 4.6 mmol/L (ref 3.5–5.1)
Sodium: 136 mmol/L (ref 135–145)

## 2021-05-03 LAB — HEPARIN LEVEL (UNFRACTIONATED): Heparin Unfractionated: 0.33 IU/mL (ref 0.30–0.70)

## 2021-05-03 LAB — CBG MONITORING, ED
Glucose-Capillary: 223 mg/dL — ABNORMAL HIGH (ref 70–99)
Glucose-Capillary: 242 mg/dL — ABNORMAL HIGH (ref 70–99)
Glucose-Capillary: 256 mg/dL — ABNORMAL HIGH (ref 70–99)

## 2021-05-03 MED ORDER — CLONIDINE HCL 0.1 MG PO TABS
0.1000 mg | ORAL_TABLET | Freq: Three times a day (TID) | ORAL | Status: DC
  Administered 2021-05-03 – 2021-05-19 (×49): 0.1 mg via ORAL
  Filled 2021-05-03 (×49): qty 1

## 2021-05-03 MED ORDER — FUROSEMIDE 10 MG/ML IJ SOLN
40.0000 mg | Freq: Every day | INTRAMUSCULAR | Status: DC
  Administered 2021-05-04 – 2021-05-06 (×3): 40 mg via INTRAVENOUS
  Filled 2021-05-03 (×3): qty 4

## 2021-05-03 NOTE — ED Notes (Signed)
Pt removed from bi-pap to n/c @ 4L. Pt request. Family at bedside

## 2021-05-03 NOTE — ED Notes (Signed)
Report received from [place], [place]

## 2021-05-03 NOTE — Progress Notes (Signed)
[place] for  Heparin  Indication: ACS/NSTEMI   Allergies  Allergen Reactions   Calcium Channel Blockers     Would avoid if possible due to h/o peripheral edema    Patient Measurements: Height: 5\' 5"  (165.1 cm) Weight: 114.3 kg (252 lb) IBW/kg (Calculated) : 61.5 Heparin Dosing Weight: 88.1 kg   Vital Signs: Temp: 99 F (37.2 C) (10/31 2312) Temp Source: Axillary (10/31 2312) BP: 156/69 (11/01 0958) Pulse Rate: 63 (11/01 0900)  Labs: Recent Labs    04/23/2021 0213 04/03/2021 0344 04/30/2021 0447 04/03/2021 1258 04/19/2021 2224 05/03/21 0716  HGB 9.7*  --   --   --   --  10.1*  HCT 29.8*  --   --   --   --  32.0*  PLT 332  --   --   --   --  302  APTT  --   --  31  --   --   --   LABPROT  --   --  15.0  --   --   --   INR  --   --  1.2  --   --   --   HEPARINUNFRC  --   --   --  0.46 0.31 0.33  CREATININE 3.12*  --   --   --   --  3.53*  TROPONINIHS 1,417* 1,514*  --   --   --   --      Estimated Creatinine Clearance: 21.8 mL/min (A) (by C-G formula based on SCr of 3.53 mg/dL (H)).   Medical History: Past Medical History:  Diagnosis Date   Adrenal gland anomaly    Arthritis    CAD (coronary artery disease)    a. 04/2006 MI and PCI/stenting to mLCx & mRCA; b. 07/2009 Cath: patent LCX/RCA stents;  c. 12/2016 NSTEMI/Cath: LM 30, LAD 30p, 14m, 30d, D1/2/3 min irregs, LCX 154m ISR, OM1 min irregs, RCA 20p, 10 ISR, 46m, 50d, RPDA min irregs, RPAV 40-->Med Rx; d. 12/2017 MV: fixed lateral wall scar, mild anterior/anterior septal ischemia. EF 30-44%.   CHF (congestive heart failure) (HCC)    CKD (chronic kidney disease), stage III (HCC)    Colon polyps    DM2 (diabetes mellitus, type 2) (HCC)    insulin requiring   Dyslipidemia    GERD (gastroesophageal reflux disease)    History of echocardiogram    a. TTE 12/2016: EF 50-55%, mod concentric LVH, images inadequate for wall motion assessment, not technically sufficienct to allow for LV  dias fxn, calcified mitral annulus    HTN (hypertension)    Kidney stones    Left Renal artery stenosis ([place])    a. 07/2013 s/p PTA/stenting ([facility]); b. 07/2017 Renal Duplex: No significant RAS.   Myocardial infarction (HCC)    OSA (obstructive sleep apnea)    Pulmonary fibrosis ([place])    a. 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   Recurrent UTI    Rotator cuff disorder    right   Skin cancer    head   Thyroid disorder    Venous insufficiency of both lower extremities    a. s/p laser treatment.     Assessment: Pharmacy consulted to dose heparin in this 75 year old male admitted with ACS/NSTEMI.  No prior anticoag noted.   Goal of Therapy:  Heparin level 0.3-0.7 units/ml Monitor platelets by anticoagulation protocol: Yes  10/31 2224 HL 0.31 11/1   0716  HL 0.33   Plan:  HL therapeutic x2. Continue heparin infusion at 1200 units/hr Will recheck with AM labs. Continue to monitor H&H and platelets  Pearla Dubonnet, PharmD Clinical Pharmacist 05/03/2021 10:11 AM

## 2021-05-03 NOTE — Progress Notes (Signed)
[facility], [place] 05/03/21  Subjective:   LOS: 0 No intake/output data recorded. Patient known to our practice from outpatient follow-up.  He is followed by Dr. Cherylann Ratel and was last seen on August 9. He has medical problems of longstanding diabetes, hypertension, history of nephrolithiasis, hyperlipidemia, coronary artery disease status post PTCA, pulmonary fibrosis on 4 L chronic oxygen, obstructive sleep apnea, history of right adrenalectomy, left renal artery stenosis status post stent, pulmonary hypertension  He presents today for chest pain and pressure that started yesterday.  He has been evaluated by cardiology team and he is currently getting anticoagulation with IV heparin. He has lower extremity edema. Oxygen requirements are same as they were at home. He is currently getting furosemide IV for volume control. Blood pressure is above goal at 157/64  His daughter is at bedside His wife got diagnosed with COVID this morning  Objective:  Vital signs in last 24 hours:  Temp:  [99 F (37.2 C)] 99 F (37.2 C) (10/31 2312) Pulse Rate:  [56-65] 63 (11/01 0900) Resp:  [15-25] 18 (11/01 0900) BP: (125-157)/(49-93) 157/64 (11/01 0900) SpO2:  [90 %-100 %] 98 % (11/01 0900)  Weight change:  Filed Weights   May 18, 2021 0211  Weight: 114.3 kg    Intake/Output:   No intake or output data in the 24 hours ending 05/03/21 0936   Physical Exam: General: No acute distress, laying in the bed  HEENT Moist oral mucous membranes  Pulm/lungs Bibasilar crackles, 4 L O2 [place]  CVS/Heart Holosystolic murmur, no rub  Abdomen:  Soft, obese, nontender  Extremities: 2+ pitting edema bilaterally  Neurologic: Alert, oriented  Skin: Warm, dry          Basic Metabolic Panel:  Recent Labs  Lab 04/28/21 1110 04/29/21 0839 04/30/21 0632 2021/05/18 0213 05/03/21 0716  NA 133* 134* 134* 135 136  K 4.2 4.0 3.8 4.5 4.6  CL 98 99 100 99 99  CO2 26 27 28 28 26    GLUCOSE 127* 174* 150* 241* 223*  BUN 81* 85* 84* 72* 67*  CREATININE 3.24* 3.45* 3.03* 3.12* 3.53*  CALCIUM 8.2* 8.0* 8.0* 8.2* 8.0*     CBC: Recent Labs  Lab 04/27/21 0446 05/18/21 0213 05/03/21 0716  WBC 10.9* 11.6* 11.2*  NEUTROABS  --  9.8*  --   HGB 9.8* 9.7* 10.1*  HCT 29.8* 29.8* 32.0*  MCV 85.9 87.6 87.9  PLT 214 332 302     No results found for: HEPBSAG, HEPBSAB, HEPBIGM    Microbiology:  Recent Results (from the past 240 hour(s))  CULTURE, BLOOD (ROUTINE X 2) w Reflex to ID Panel     Status: None   Collection Time: 04/25/21  3:33 PM   Specimen: BLOOD  Result Value Ref Range Status   Specimen Description BLOOD BLOOD RIGHT WRIST  Final   Special Requests   Final    BOTTLES DRAWN AEROBIC AND ANAEROBIC Blood Culture adequate volume   Culture   Final    NO GROWTH 5 DAYS Performed at [facility], [address] [place], [place] [address]    Report Status 04/30/2021 FINAL  Final  CULTURE, BLOOD (ROUTINE X 2) w Reflex to ID Panel     Status: None   Collection Time: 04/25/21  3:38 PM   Specimen: BLOOD  Result Value Ref Range Status   Specimen Description BLOOD BLOOD RIGHT HAND  Final   Special Requests   Final    BOTTLES DRAWN AEROBIC AND ANAEROBIC  Blood Culture adequate volume   Culture   Final    NO GROWTH 5 DAYS Performed at [facility], [address] [place], [place] [address]    Report Status 04/30/2021 FINAL  Final  Resp Panel by RT-PCR (Flu A&B, Covid)     Status: None   Collection Time: May 21, 2021 10:46 AM  Result Value Ref Range Status   SARS Coronavirus 2 by RT PCR NEGATIVE NEGATIVE Final    Comment: (NOTE) SARS-CoV-2 target nucleic acids are NOT DETECTED.  The SARS-CoV-2 RNA is generally detectable in upper respiratory specimens during the acute phase of infection. The lowest concentration of SARS-CoV-2 viral copies this assay can detect is 138 copies/mL. A negative result does not preclude SARS-Cov-2 infection and  should not be used as the sole basis for treatment or other patient management decisions. A negative result may occur with  improper specimen collection/handling, submission of specimen other than nasopharyngeal swab, presence of viral mutation(s) within the areas targeted by this assay, and inadequate number of viral copies(<138 copies/mL). A negative result must be combined with clinical observations, patient history, and epidemiological information. The expected result is Negative.  Fact Sheet for Patients:  BloggerCourse.com  Fact Sheet for Healthcare Providers:  SeriousBroker.it  This test is no t yet approved or cleared by the [place] FDA and  has been authorized for detection and/or diagnosis of SARS-CoV-2 by FDA under an Emergency Use Authorization (EUA). This EUA will remain  in effect (meaning this test can be used) for the duration of the COVID-19 declaration under Section 564(b)(1) of the Act, 21 U.S.C.section 360bbb-3(b)(1), unless the authorization is terminated  or revoked sooner.       Influenza A by PCR NEGATIVE NEGATIVE Final   Influenza B by PCR NEGATIVE NEGATIVE Final    Comment: (NOTE) The Xpert Xpress SARS-CoV-2/FLU/RSV plus assay is intended as an aid in the diagnosis of influenza from Nasopharyngeal swab specimens and should not be used as a sole basis for treatment. Nasal washings and aspirates are unacceptable for Xpert Xpress SARS-CoV-2/FLU/RSV testing.  Fact Sheet for Patients: BloggerCourse.com  Fact Sheet for Healthcare Providers: SeriousBroker.it  This test is not yet approved or cleared by the [place] FDA and has been authorized for detection and/or diagnosis of SARS-CoV-2 by FDA under an Emergency Use Authorization (EUA). This EUA will remain in effect (meaning this test can be used) for the duration of the COVID-19 declaration  under Section 564(b)(1) of the Act, 21 U.S.C. section 360bbb-3(b)(1), unless the authorization is terminated or revoked.  Performed at [facility], [address] [place], [place] [address]     Coagulation Studies: Recent Labs    05/03/2021 0447  LABPROT 15.0  INR 1.2    Urinalysis: Recent Labs    05/17/2021 0651  COLORURINE YELLOW*  LABSPEC 1.014  PHURINE 5.0  GLUCOSEU 50*  HGBUR NEGATIVE  BILIRUBINUR NEGATIVE  KETONESUR NEGATIVE  PROTEINUR 100*  NITRITE NEGATIVE  LEUKOCYTESUR NEGATIVE      Imaging: DG Chest Port 1 View  Result Date: 05/19/2021 CLINICAL DATA:  Shortness of breath EXAM: PORTABLE CHEST 1 VIEW COMPARISON:  Chest radiograph dated 04/26/2021. CT chest dated 04/05/2021. FINDINGS: Lower lobe predominant scarring/fibrosis. When correlating with prior CT, this is compatible with chronic interstitial lung disease. No definite pleural effusions. No pneumothorax. Cardiomegaly.  Thoracic aortic atherosclerosis. IMPRESSION: Lower lobe predominant scarring/fibrosis, corresponding to suspected chronic interstitial lung disease on CT. Electronically Signed   By: Charline Bills M.D.   On:  04/23/2021 02:36     Medications:    heparin 1,200 Units/hr (05/03/21 0914)    aspirin EC  81 mg Oral Daily   atorvastatin  80 mg Oral QHS   calcitRIOL  0.25 mcg Oral Daily   carvedilol  25 mg Oral BID WC   cloNIDine  0.1 mg Oral TID   clopidogrel  75 mg Oral Daily   dorzolamide-timolol  1 drop Both Eyes BID   doxycycline  100 mg Oral Q12H   ezetimibe  10 mg Oral Daily   fluticasone furoate-vilanterol  1 puff Inhalation Daily   And   umeclidinium bromide  1 puff Inhalation Daily   [START ON 05/04/2021] furosemide  40 mg Intravenous Daily   gabapentin  100 mg Oral BID   insulin aspart  0-20 Units Subcutaneous TID WC   insulin aspart  0-5 Units Subcutaneous QHS   insulin detemir  30 Units Subcutaneous QHS   isosorbide mononitrate  120 mg Oral Daily    levothyroxine  50 mcg Oral Q0600   montelukast  10 mg Oral Daily   pantoprazole  40 mg Oral Daily   tamsulosin  0.4 mg Oral QHS   cyanocobalamin  1,000 mcg Oral Daily   acetaminophen, albuterol, HYDROcodone-acetaminophen, nitroGLYCERIN, ondansetron (ZOFRAN) IV, polyethylene glycol  Assessment/ Plan:  75 y.o. male with medical problems of longstanding diabetes, hypertension, history of nephrolithiasis, hyperlipidemia, coronary artery disease status post PTCA, pulmonary fibrosis on 4 L chronic oxygen, obstructive sleep apnea, history of right adrenalectomy, left renal artery stenosis status post stent, pulmonary hypertension   Active Problems:   Acute on chronic diastolic CHF (congestive heart failure) (HCC)   Elevated troponin   Unstable angina (HCC)   CKD (chronic kidney disease), stage IV (HCC)   #. CKD st 4 Recent Labs    04/29/21 0839 04/30/21 0632 04/22/2021 0213 05/03/21 0716  CREATININE 3.45* 3.03* 3.12* 3.53*  Patient has underlying CKD stage IV.  CKD risk factors include diabetes type 2, hypertension, history of IV contrast exposure, atherosclerosis and age Serum creatinine has been fluctuating, Baseline appears to be 2.66/GFR 25 from April 17, 2021 Creatinine is expected to fluctuate more with aggressive diuresis We discussed possibility of needing dialysis if his condition gets worse.  Overall he has several chronic health conditions and adding dialysis will affect his quality of life.  We discussed several options including peritoneal dialysis and hemodialysis.  Patient is also in the process of talking with palliative care to clarify goals of care.  #. Anemia of CKD  Lab Results  Component Value Date   HGB 10.1 (L) 05/03/2021  Continue to monitor  #. SHPTH  No results found for: PTH No results found for: PHOS Outpatient PTH 182 from February 08, 2021 Continued on oral calcitriol  #. HTN with CKD and lower extremity edema Currently getting aggressive IV  diuresis with IV furosemide Monitor closely  #. Diabetes type 2 with CKD Hgb A1c MFr Bld (%)  Date Value  04/27/2021 7.5 (H)  Management as per primary team  #Pulmonary hypertension Right heart catheterization December 06, 2020 Pulmonary artery pressure of 44 mm, right ventricular pressure of 48 mm 2D echo from April 18, 2021.  LVEF 60 to 65%, grade 1 diastolic dysfunction, elevated left atrial pressure, moderate thickening of aortic valve, mild calcification of mitral valve     LOS: 0 Stephen [name] 11/1/20229:36 AM  [facility] [place], [place] [phone]

## 2021-05-03 NOTE — Progress Notes (Signed)
[place] for  Heparin  Indication: ACS/NSTEMI   Allergies  Allergen Reactions   Calcium Channel Blockers     Would avoid if possible due to h/o peripheral edema    Patient Measurements: Height: 5\' 5"  (165.1 cm) Weight: 114.3 kg (252 lb) IBW/kg (Calculated) : 61.5 Heparin Dosing Weight: 88.1 kg   Vital Signs: BP: 146/62 (10/31 2312) Pulse Rate: 59 (10/31 2312)  Labs: Recent Labs    04/30/21 3704 04/24/2021 0213 05/01/2021 0344 04/11/2021 0447 04/29/2021 1258 04/18/2021 2224  HGB  --  9.7*  --   --   --   --   HCT  --  29.8*  --   --   --   --   PLT  --  332  --   --   --   --   APTT  --   --   --  31  --   --   LABPROT  --   --   --  15.0  --   --   INR  --   --   --  1.2  --   --   HEPARINUNFRC  --   --   --   --  0.46 0.31  CREATININE 3.03* 3.12*  --   --   --   --   TROPONINIHS  --  1,417* 1,514*  --   --   --      Estimated Creatinine Clearance: 24.6 mL/min (A) (by C-G formula based on SCr of 3.12 mg/dL (H)).   Medical History: Past Medical History:  Diagnosis Date   Adrenal gland anomaly    Arthritis    CAD (coronary artery disease)    a. 04/2006 MI and PCI/stenting to mLCx & mRCA; b. 07/2009 Cath: patent LCX/RCA stents;  c. 12/2016 NSTEMI/Cath: LM 30, LAD 30p, 48m, 30d, D1/2/3 min irregs, LCX 135m ISR, OM1 min irregs, RCA 20p, 10 ISR, 67m, 50d, RPDA min irregs, RPAV 40-->Med Rx; d. 12/2017 MV: fixed lateral wall scar, mild anterior/anterior septal ischemia. EF 30-44%.   CHF (congestive heart failure) (HCC)    CKD (chronic kidney disease), stage III (HCC)    Colon polyps    DM2 (diabetes mellitus, type 2) (HCC)    insulin requiring   Dyslipidemia    GERD (gastroesophageal reflux disease)    History of echocardiogram    a. TTE 12/2016: EF 50-55%, mod concentric LVH, images inadequate for wall motion assessment, not technically sufficienct to allow for LV dias fxn, calcified mitral annulus    HTN (hypertension)    Kidney stones     Left Renal artery stenosis ([place])    a. 07/2013 s/p PTA/stenting ([place]); b. 07/2017 Renal Duplex: No significant RAS.   Myocardial infarction (HCC)    OSA (obstructive sleep apnea)    Pulmonary fibrosis ([place])    a. 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   Recurrent UTI    Rotator cuff disorder    right   Skin cancer    head   Thyroid disorder    Venous insufficiency of both lower extremities    a. s/p laser treatment.     Assessment: Pharmacy consulted to dose heparin in this 75 year old male admitted with ACS/NSTEMI.  No prior anticoag noted.   Goal of Therapy:  Heparin level 0.3-0.7 units/ml Monitor platelets by anticoagulation protocol: Yes  10/31 2224 HL 0.31   Plan:  HL therapeutic. Continue heparin  infusion at 1200 units/hr Will recheck with AM labs. Continue to monitor H&H and platelets  Renda Rolls, PharmD, [facility] 05/03/2021 12:06 AM

## 2021-05-03 NOTE — ED Notes (Signed)
Pt back on Bipap.

## 2021-05-03 NOTE — Progress Notes (Signed)
Progress Note  Patient Name: Stephen [name] Date of Encounter: 05/03/2021  [facility] Cardiologist: Ida Rogue, MD   Subjective   Feels about the same as yesterday with continued shortness of breath.  No chest pain currently but had some discomfort overnight.  Leg swelling might be slightly better than yesterday.  Inpatient Medications    Scheduled Meds:  aspirin EC  81 mg Oral Daily   atorvastatin  80 mg Oral QHS   calcitRIOL  0.25 mcg Oral Daily   carvedilol  25 mg Oral BID WC   clopidogrel  75 mg Oral Daily   dorzolamide-timolol  1 drop Both Eyes BID   doxycycline  100 mg Oral Q12H   ezetimibe  10 mg Oral Daily   fluticasone furoate-vilanterol  1 puff Inhalation Daily   And   umeclidinium bromide  1 puff Inhalation Daily   furosemide  40 mg Intravenous BID   gabapentin  100 mg Oral BID   insulin aspart  0-20 Units Subcutaneous TID WC   insulin aspart  0-5 Units Subcutaneous QHS   insulin detemir  30 Units Subcutaneous QHS   isosorbide mononitrate  120 mg Oral Daily   levothyroxine  50 mcg Oral Q0600   montelukast  10 mg Oral Daily   pantoprazole  40 mg Oral Daily   tamsulosin  0.4 mg Oral QHS   cyanocobalamin  1,000 mcg Oral Daily   Continuous Infusions:  heparin 1,200 Units/hr (04/28/2021 2310)   PRN Meds: acetaminophen, albuterol, HYDROcodone-acetaminophen, nitroGLYCERIN, ondansetron (ZOFRAN) IV, polyethylene glycol   Vital Signs    Vitals:   04/07/2021 2312 05/03/21 0201 05/03/21 0537 05/03/21 0830  BP: (!) 146/62 (!) 129/56 (!) 137/49 (!) 155/93  Pulse: (!) 59 60 65 61  Resp: 18 15 17 19   Temp: 99 F (37.2 C)     TempSrc: Axillary     SpO2: 100% 99% 98% 99%  Weight:      Height:       No intake or output data in the 24 hours ending 05/03/21 0909 Last 3 Weights 04/30/2021 04/30/2021 04/29/2021  Weight (lbs) 252 lb 270 lb 8.1 oz 272 lb 14.9 oz  Weight (kg) 114.306 kg 122.7 kg 123.8 kg  Some encounter information is confidential and  restricted. Go to Review Flowsheets activity to see all data.      Telemetry    Sinus rhythm with frequent PVCs including episodes of bigeminy - Personally Reviewed  ECG    Underlying artifact.  Appears to be sinus rhythm with LBBB.  No significant change from prior tracing yesterday morning. - Personally Reviewed  Physical Exam   GEN: Chronically ill-appearing man lying in bed.  Daughter is at the bedside. Neck: Difficult to assess JVP due to body habitus. Cardiac: Regular rate and rhythm with 2/6 systolic murmur.  Occasional extrasystoles noted. Respiratory: Fair air movement with bilateral crackles in the lower halves of the lungs GI: Soft, nontender, non-distended  MS: Trace pretibial edema bilaterally; No deformity. Neuro:  Nonfocal  Psych: Flat affect  Labs    High Sensitivity Troponin:   Recent Labs  Lab 04/18/21 0505 04/18/21 0658 04/18/21 1240 04/06/2021 0213 04/27/2021 0344  TROPONINIHS 2,129* 1,997* 1,842* 1,417* 1,514*     Chemistry Recent Labs  Lab 04/30/21 0632 04/18/2021 0213 05/03/21 0716  NA 134* 135 136  K 3.8 4.5 4.6  CL 100 99 99  CO2 28 28 26   GLUCOSE 150* 241* 223*  BUN 84* 72* 67*  CREATININE 3.03*  3.12* 3.53*  CALCIUM 8.0* 8.2* 8.0*  GFRNONAA 21* 20* 17*  ANIONGAP 6 8 11     Lipids No results for input(s): CHOL, TRIG, HDL, LABVLDL, LDLCALC, CHOLHDL in the last 168 hours.  Hematology Recent Labs  Lab 04/27/21 0446 04/20/2021 0213 05/03/21 0716  WBC 10.9* 11.6* 11.2*  RBC 3.47* 3.40* 3.64*  HGB 9.8* 9.7* 10.1*  HCT 29.8* 29.8* 32.0*  MCV 85.9 87.6 87.9  MCH 28.2 28.5 27.7  MCHC 32.9 32.6 31.6  RDW 15.7* 15.9* 15.7*  PLT 214 332 302   Thyroid No results for input(s): TSH, FREET4 in the last 168 hours.  BNP Recent Labs  Lab 04/24/2021 0214  BNP 1,205.9*    DDimer No results for input(s): DDIMER in the last 168 hours.   Radiology    DG Chest Port 1 View  Result Date: 04/20/2021 CLINICAL DATA:  Shortness of breath EXAM: PORTABLE  CHEST 1 VIEW COMPARISON:  Chest radiograph dated 04/26/2021. CT chest dated 04/05/2021. FINDINGS: Lower lobe predominant scarring/fibrosis. When correlating with prior CT, this is compatible with chronic interstitial lung disease. No definite pleural effusions. No pneumothorax. Cardiomegaly.  Thoracic aortic atherosclerosis. IMPRESSION: Lower lobe predominant scarring/fibrosis, corresponding to suspected chronic interstitial lung disease on CT. Electronically Signed   By: Julian Hy M.D.   On: 04/24/2021 02:36    Cardiac Studies   TTE (04/18/2021):  1. Left ventricular ejection fraction, by estimation, is 60 to 65%. The  left ventricle has normal function. Left ventricular endocardial border  not optimally defined to evaluate regional wall motion. The left  ventricular internal cavity size was mildly  dilated. There is mild left ventricular hypertrophy. Left ventricular  diastolic parameters are consistent with Grade I diastolic dysfunction  (impaired relaxation). Elevated left atrial pressure.   2. Right ventricular systolic function is normal. The right ventricular  size is not well visualized.   3. The mitral valve is abnormal. Trivial mitral valve regurgitation. Mild  mitral stenosis. The mean mitral valve gradient is 5.0 mmHg.   4. Tricuspid valve regurgitation not well assessed.   5. The aortic valve has an indeterminant number of cusps. There is mild  calcification of the aortic valve. There is moderate thickening of the  aortic valve. Aortic valve regurgitation is trivial. Mild to moderate  aortic valve sclerosis/calcification is  present, without any evidence of aortic stenosis.  Patient Profile     75 y.o. 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  Assessment & Plan    Acute on chronic HFpEF and pulmonary hypertension: Volume status appears slightly better than yesterday.  I's and O's not recorded.  Slight bump in creatinine noted.  Suspect that some of underlying dyspnea/hypoxia also driven by interstitial lung disease. -Decrease furosemide to 40 mg IV daily. -Recommend palliative care consult to assist with symptoms management.  Coronary artery disease and demand ischemia: Recurrent chest pain overnight.  EKG shows chronic left bundle branch block.  Patient with known CTO of LCx as well as moderate to severe LAD and RCA disease by catheterization earlier this year.  Given his tenuous respiratory status (unable to lie flat) and advanced CKD, he is not a good candidate for repeat catheterization or intervention. -Continue aspirin, clopidogrel, and heparin infusion x48 hours. -Continue carvedilol  25 mg twice daily and isosorbide mononitrate 120 mg daily.  Hypertension: Blood pressure trending up.  Of note, looks like clonidine and hydralazine have been held.  I worry there may be some rebound hypertension, particularly from clonidine withdrawal. -Start clonidine 0.1 mg 3 times daily with escalation as needed (records indicate patient was taking 0.3 mg 3 times daily at home). -Consider restarting hydralazine. -Continue carvedilol and isosorbide mononitrate. -If elevated blood pressure and recurrent chest pain remain ongoing issues, it may be worthwhile Rie challenging Mr. Verde with amlodipine (chart indicates history of edema with calcium channel blockers though edema certainly could also be due to his underlying heart failure).  Chronic respiratory failure with hypoxia: Likely multifactorial including acute on chronic HFpEF and interstitial lung disease.  Treatment options are quite limited. -Recommend palliative care consultation. -Titrate supplemental oxygen as  needed, with ongoing management per internal medicine.    For questions or updates, please contact [place] Please consult [email or web address] for contact info under [facility] Cardiology.     Signed, Nelva Bush, MD  05/03/2021, 9:09 AM

## 2021-05-03 NOTE — Progress Notes (Signed)
Patient ID: Stephen [name], male   DOB: 05/15/1946, 75 y.o.   MRN: [id number] [facility] PROGRESS NOTE  Stephen [name] ERD:[id number] DOB: 12-27-1945 DOA: 04/13/2021 PCP: [name], MD  HPI/Subjective: Patient not feeling well at home.  Still with some shortness of breath.  Had chest pain overnight.  Patient was readmitted with shortness of breath.  Found to have acute on chronic diastolic congestive heart failure and pulmonary hypertension.  Objective: Vitals:   05/03/21 1430 05/03/21 1500  BP: (!) 148/93 (!) 141/56  Pulse: 63 (!) 56  Resp: 16 19  Temp:    SpO2: 99% 94%   No intake or output data in the 24 hours ending 05/03/21 1520 Filed Weights   04/13/2021 0211  Weight: 114.3 kg    ROS: Review of Systems  Respiratory:  Positive for shortness of breath.   Cardiovascular:  Positive for chest pain.  Gastrointestinal:  Positive for nausea. Negative for abdominal pain and vomiting.  Exam: Physical Exam HENT:     Head: Normocephalic.     Mouth/Throat:     Pharynx: No oropharyngeal exudate.  Eyes:     General: Lids are normal.     Conjunctiva/sclera: Conjunctivae normal.  Cardiovascular:     Rate and Rhythm: Normal rate and regular rhythm.     Heart sounds: Normal heart sounds, S1 normal and S2 normal.  Pulmonary:     Breath sounds: Examination of the right-lower field reveals decreased breath sounds. Examination of the left-lower field reveals decreased breath sounds. Decreased breath sounds present. No wheezing, rhonchi or rales.  Abdominal:     Palpations: Abdomen is soft.     Tenderness: There is no abdominal tenderness.  Musculoskeletal:     Right lower leg: Swelling present.     Left lower leg: Swelling present.  Skin:    General: Skin is warm.     Findings: No rash.  Neurological:     Mental Status: He is alert and oriented to person, place, and time.      Scheduled Meds:  aspirin EC  81 mg Oral Daily   atorvastatin  80 mg Oral QHS    calcitRIOL  0.25 mcg Oral Daily   carvedilol  25 mg Oral BID WC   cloNIDine  0.1 mg Oral TID   clopidogrel  75 mg Oral Daily   dorzolamide-timolol  1 drop Both Eyes BID   doxycycline  100 mg Oral Q12H   ezetimibe  10 mg Oral Daily   fluticasone furoate-vilanterol  1 puff Inhalation Daily   And   umeclidinium bromide  1 puff Inhalation Daily   [START ON 05/04/2021] furosemide  40 mg Intravenous Daily   gabapentin  100 mg Oral BID   insulin aspart  0-20 Units Subcutaneous TID WC   insulin aspart  0-5 Units Subcutaneous QHS   insulin detemir  30 Units Subcutaneous QHS   isosorbide mononitrate  120 mg Oral Daily   levothyroxine  50 mcg Oral Q0600   montelukast  10 mg Oral Daily   pantoprazole  40 mg Oral Daily   tamsulosin  0.4 mg Oral QHS   cyanocobalamin  1,000 mcg Oral Daily   Continuous Infusions:  heparin 1,200 Units/hr (05/03/21 0914)   Brief history.   75 year old man with readmitted with shortness of breath and acute on chronic diastolic congestive heart failure with pulmonary hypertension.  The patient's creatinine has worsened with diuresis.  Patient empirically placed on heparin drip.  Cardiology and nephrology following. Was  readmitted after a long hospital course.  Patient was treated medically for NSTEMI.  Patient has acute on chronic hypoxic hypercapnic respiratory failure on BiPAP at night.  Also has pulmonary hypertension and interstitial lung disease chronically on 4 L of oxygen.  Patient has acute on chronic diastolic congestive heart failure and creatinine is very sensitive to diuretics.  Patient was also treated for UTI during the hospital course and acute metabolic encephalopathy and received 7 days of Rocephin for E. coli and Serratia in the urine.  I did add doxycycline for erythema of the left antecubital fossa to cover staph aureus from recent IV site.  Patient also had a superficial thrombophlebitis left arm which was treated with conservative  management.  Assessment/Plan:  Acute on chronic diastolic congestive heart failure.  Continue Coreg.  Patient was started on IV Lasix 40 mg IV twice daily but decreased to daily dosing secondary to increasing creatinine. Elevated troponin with history of CAD and recent NSTEMI.  Patient will be placed on heparin drip for 48 hours.  Continue Coreg and Imdur.  Patient is also on Plavix and atorvastatin.  Recent LDL 69.  Medical management with chronic kidney disease. Acute on chronic hypoxic hypercapnic respiratory failure.  Pulmonary hypertension and interstitial lung disease.  On BiPAP at night.  4 L to 6 L of oxygen during the day. Acute kidney injury on chronic kidney disease stage IV.  With diuresis creatinine went from 3.12 up to 3.53.  Continue with daily creatinines. Type 2 diabetes mellitus with chronic kidney disease stage IV.  Levemir 30 units at night and short acting insulin prior to meals. Sleep apnea with morbid obesity.  BMI 41.93.  This is likely not accurate because of the higher weight on previous discharge.  Continue BiPAP at night. Superficial thrombophlebitis left arm.  Conservative management.  Patient is on heparin drip secondary to elevated troponin not because of this.  Continue doxycycline for few more days.  Erythema left antecubital fossa improved. Vitamin B12 deficiency.  Continue oral B12. Palliative care consultation.  Patient's overall prognosis is poor.  Patient is a full code.  Difficult balance with CHF, acute kidney injury on CKD, chronic hypoxic hypercarbic respiratory failure, interstitial lung disease and pulmonary fibrosis. Generalized weakness.  Patient did not want to go to rehab on last hospitalization and wanted to go home.  Patient was only home for short time and ended up having to come back to the hospital.        Code Status:     Code Status Orders  (From admission, onward)           Start     Ordered   04/07/2021 0518  Full code  Continuous         04/18/2021 0519           Code Status History     Date Active Date Inactive Code Status Order ID Comments User Context   04/17/2021 2341 04/30/2021 2257 Full Code [id number]  Athena Masse, MD ED   12/06/2020 1037 12/06/2020 1624 Full Code [id number]  Wellington Hampshire, MD Inpatient   08/19/2020 1229 08/19/2020 1849 Full Code [id number]  Minna Merritts, MD Inpatient   09/20/2018 1114 09/22/2018 2127 Full Code [id number]  Bettey Costa, MD ED   01/16/2017 0035 01/18/2017 1702 Full Code [id number]  Lance Coon, MD ED   01/11/2017 1654 01/13/2017 1736 Full Code [id number]  Demetrios Loll, MD Inpatient   01/11/2017 1341 01/11/2017 1654 Full Code [id number]  Wellington Hampshire, MD Inpatient   03/20/2012 2310 03/21/2012 1350 Full Code [id number]  Clayton Bibles, [place] ED      Family Communication: Spoke with daughter at the bedside Disposition Plan: Status is: Inpatient  Consultants: Cardiology Nephrology Palliative care  Antibiotics: Doxycycline  Time spent: 27 minutes  [place]

## 2021-05-03 NOTE — ED Notes (Signed)
Changed patient's linens, chuck pads, and placed in a gown. Repositioned in the bed.

## 2021-05-03 NOTE — Progress Notes (Signed)
                                                     Palliative Care Progress Note, Assessment & Plan   Patient Name: Stephen [name]       Date: 05/03/2021 DOB: Feb 02, 1946  Age: 75 y.o. MRN#: [id number] Attending Physician: Stephen Grayer, MD Primary Care Physician: Stephen Ghent, MD Admit Date: 04/03/2021  Reason for Consultation/Follow-up: Establishing goals of care  Subjective: Pt is sitting in bed with [place] in place in NAD. Daughter at bedside.   Summary: After reviewing the chart, epic notes, and assessing the patient at bedside, patient shared he would like his wife to be involved with our discussion.  Patient's wife was phoned and placed on speakerphone. She shared that she just tested positive for COVID and is working with her primary care provider to get medications.  She is unable to speak at this time.  Patient shared he would like to call the palliative medicine team when he is ready to talk and when his wife is available.  Contact information given.  I will await call from patient.  Thank you for allowing the Palliative Medicine Team to assist in the care of this patient.  [place] Stephen Iha, FNP-BC Palliative Medicine Team Team Phone # [phone]   NO CHARGE

## 2021-05-03 NOTE — ED Notes (Signed)
Ace wrap Rt hand

## 2021-05-03 DEATH — deceased

## 2021-05-04 ENCOUNTER — Encounter: Payer: Self-pay | Admitting: Internal Medicine

## 2021-05-04 DIAGNOSIS — I214 Non-ST elevation (NSTEMI) myocardial infarction: Secondary | ICD-10-CM | POA: Diagnosis not present

## 2021-05-04 DIAGNOSIS — J849 Interstitial pulmonary disease, unspecified: Secondary | ICD-10-CM | POA: Diagnosis not present

## 2021-05-04 DIAGNOSIS — R778 Other specified abnormalities of plasma proteins: Secondary | ICD-10-CM | POA: Diagnosis not present

## 2021-05-04 DIAGNOSIS — I5032 Chronic diastolic (congestive) heart failure: Secondary | ICD-10-CM | POA: Diagnosis not present

## 2021-05-04 DIAGNOSIS — I5033 Acute on chronic diastolic (congestive) heart failure: Secondary | ICD-10-CM | POA: Diagnosis not present

## 2021-05-04 DIAGNOSIS — R0789 Other chest pain: Secondary | ICD-10-CM

## 2021-05-04 LAB — BASIC METABOLIC PANEL
Anion gap: 6 (ref 5–15)
BUN: 72 mg/dL — ABNORMAL HIGH (ref 8–23)
CO2: 31 mmol/L (ref 22–32)
Calcium: 7.9 mg/dL — ABNORMAL LOW (ref 8.9–10.3)
Chloride: 99 mmol/L (ref 98–111)
Creatinine, Ser: 3.72 mg/dL — ABNORMAL HIGH (ref 0.61–1.24)
GFR, Estimated: 16 mL/min — ABNORMAL LOW (ref >60)
Glucose, Bld: 218 mg/dL — ABNORMAL HIGH (ref 70–99)
Potassium: 4.4 mmol/L (ref 3.5–5.1)
Sodium: 136 mmol/L (ref 135–145)

## 2021-05-04 LAB — GLUCOSE, CAPILLARY
Glucose-Capillary: 215 mg/dL — ABNORMAL HIGH (ref 70–99)
Glucose-Capillary: 201 mg/dL — ABNORMAL HIGH (ref 70–99)
Glucose-Capillary: 198 mg/dL — ABNORMAL HIGH (ref 70–99)

## 2021-05-04 LAB — CBG MONITORING, ED
Glucose-Capillary: 195 mg/dL — ABNORMAL HIGH (ref 70–99)
Glucose-Capillary: 217 mg/dL — ABNORMAL HIGH (ref 70–99)

## 2021-05-04 LAB — HEPARIN LEVEL (UNFRACTIONATED): Heparin Unfractionated: 0.29 IU/mL — ABNORMAL LOW (ref 0.30–0.70)

## 2021-05-04 MED ORDER — ACETAMINOPHEN 325 MG PO TABS
650.0000 mg | ORAL_TABLET | ORAL | Status: DC | PRN
  Administered 2021-05-04 – 2021-05-12 (×4): 650 mg via ORAL
  Filled 2021-05-04 (×4): qty 2

## 2021-05-04 MED ORDER — HEPARIN BOLUS VIA INFUSION
1300.0000 [IU] | Freq: Once | INTRAVENOUS | Status: AC
  Administered 2021-05-04: 1300 [IU] via INTRAVENOUS
  Filled 2021-05-04: qty 1300

## 2021-05-04 MED ORDER — ENOXAPARIN SODIUM 30 MG/0.3ML IJ SOSY
30.0000 mg | PREFILLED_SYRINGE | INTRAMUSCULAR | Status: DC
  Administered 2021-05-04 – 2021-05-11 (×7): 30 mg via SUBCUTANEOUS
  Filled 2021-05-04 (×8): qty 0.3

## 2021-05-04 MED ORDER — HYDROCODONE-ACETAMINOPHEN 5-325 MG PO TABS
1.0000 | ORAL_TABLET | Freq: Four times a day (QID) | ORAL | Status: DC | PRN
Start: 2021-05-04 — End: 2021-05-05
  Administered 2021-05-04: 2 via ORAL
  Administered 2021-05-05: 1 via ORAL
  Administered 2021-05-05: 2 via ORAL
  Administered 2021-05-05: 1 via ORAL
  Filled 2021-05-04: qty 1
  Filled 2021-05-04 (×3): qty 2

## 2021-05-04 NOTE — ED Notes (Signed)
Pt transitioned to Nasal Cannula 4L at this time.  O2 Sat 100.

## 2021-05-04 NOTE — Progress Notes (Signed)
[place], [place] 05/04/21  Subjective:   LOS: 1 11/01 0701 - 11/02 0700 In: -  Out: 800 [Urine:800] Patient known to our practice from outpatient follow-up.  He is followed by Dr. Holley Raring and was last seen on August 9. He has medical problems of longstanding diabetes, hypertension, history of nephrolithiasis, hyperlipidemia, coronary artery disease status post PTCA, pulmonary fibrosis on 4 L chronic oxygen, obstructive sleep apnea, history of right adrenalectomy, left renal artery stenosis status post stent, pulmonary hypertension  Patient seen resting in bed, son at bedside Currently on supplemental oxygen at 4L 50% of breakfast consumed Denies chest pain  Objective:  Vital signs in last 24 hours:  Temp:  [98.1 F (36.7 C)-98.5 F (36.9 C)] 98.1 F (36.7 C) (11/02 0948) Pulse Rate:  [36-74] 55 (11/02 0948) Resp:  [11-22] 18 (11/02 0948) BP: (123-168)/(54-139) 148/59 (11/02 0948) SpO2:  [94 %-100 %] 96 % (11/02 0948) FiO2 (%):  [35 %] 35 % (11/02 0757)  Weight change:  Filed Weights   04/14/2021 0211  Weight: 114.3 kg    Intake/Output:    Intake/Output Summary (Last 24 hours) at 05/04/2021 1024 Last data filed at 05/04/2021 0930 Gross per 24 hour  Intake --  Output 1350 ml  Net -1350 ml     Physical Exam: General: No acute distress, laying in the bed  HEENT Moist oral mucous membranes  Pulm/lungs Bibasilar crackles, 4 L O2 [place]  CVS/Heart Holosystolic murmur, no rub  Abdomen:  Soft, obese, nontender  Extremities: 2+ pitting edema bilaterally  Neurologic: Alert, oriented  Skin: Warm, dry          Basic Metabolic Panel:  Recent Labs  Lab 04/29/21 0839 04/30/21 0632 04/12/2021 0213 05/03/21 0716 05/04/21 0610  NA 134* 134* 135 136 136  K 4.0 3.8 4.5 4.6 4.4  CL 99 100 99 99 99  CO2 27 28 28 26 31   GLUCOSE 174* 150* 241* 223* 218*  BUN 85* 84* 72* 67* 72*  CREATININE 3.45* 3.03* 3.12* 3.53* 3.72*  CALCIUM 8.0* 8.0* 8.2*  8.0* 7.9*      CBC: Recent Labs  Lab 04/24/2021 0213 05/03/21 0716  WBC 11.6* 11.2*  NEUTROABS 9.8*  --   HGB 9.7* 10.1*  HCT 29.8* 32.0*  MCV 87.6 87.9  PLT 332 302      No results found for: HEPBSAG, HEPBSAB, HEPBIGM    Microbiology:  Recent Results (from the past 240 hour(s))  CULTURE, BLOOD (ROUTINE X 2) w Reflex to ID Panel     Status: None   Collection Time: 04/25/21  3:33 PM   Specimen: BLOOD  Result Value Ref Range Status   Specimen Description BLOOD BLOOD RIGHT WRIST  Final   Special Requests   Final    BOTTLES DRAWN AEROBIC AND ANAEROBIC Blood Culture adequate volume   Culture   Final    NO GROWTH 5 DAYS Performed at [facility], [address] [place], [place] [address]    Report Status 04/30/2021 FINAL  Final  CULTURE, BLOOD (ROUTINE X 2) w Reflex to ID Panel     Status: None   Collection Time: 04/25/21  3:38 PM   Specimen: BLOOD  Result Value Ref Range Status   Specimen Description BLOOD BLOOD RIGHT HAND  Final   Special Requests   Final    BOTTLES DRAWN AEROBIC AND ANAEROBIC Blood Culture adequate volume   Culture   Final    NO GROWTH 5 DAYS Performed at [facility]  [facility], [address] [place], [place] [address]    Report Status 04/30/2021 FINAL  Final  Resp Panel by RT-PCR (Flu A&B, Covid)     Status: None   Collection Time: 04/12/2021 10:46 AM  Result Value Ref Range Status   SARS Coronavirus 2 by RT PCR NEGATIVE NEGATIVE Final    Comment: (NOTE) SARS-CoV-2 target nucleic acids are NOT DETECTED.  The SARS-CoV-2 RNA is generally detectable in upper respiratory specimens during the acute phase of infection. The lowest concentration of SARS-CoV-2 viral copies this assay can detect is 138 copies/mL. A negative result does not preclude SARS-Cov-2 infection and should not be used as the sole basis for treatment or other patient management decisions. A negative result may occur with  improper specimen collection/handling,  submission of specimen other than nasopharyngeal swab, presence of viral mutation(s) within the areas targeted by this assay, and inadequate number of viral copies(<138 copies/mL). A negative result must be combined with clinical observations, patient history, and epidemiological information. The expected result is Negative.  Fact Sheet for Patients:  EntrepreneurPulse.com.au  Fact Sheet for Healthcare Providers:  IncredibleEmployment.be  This test is no t yet approved or cleared by the [place] FDA and  has been authorized for detection and/or diagnosis of SARS-CoV-2 by FDA under an Emergency Use Authorization (EUA). This EUA will remain  in effect (meaning this test can be used) for the duration of the COVID-19 declaration under Section 564(b)(1) of the Act, 21 U.S.C.section 360bbb-3(b)(1), unless the authorization is terminated  or revoked sooner.       Influenza A by PCR NEGATIVE NEGATIVE Final   Influenza B by PCR NEGATIVE NEGATIVE Final    Comment: (NOTE) The Xpert Xpress SARS-CoV-2/FLU/RSV plus assay is intended as an aid in the diagnosis of influenza from Nasopharyngeal swab specimens and should not be used as a sole basis for treatment. Nasal washings and aspirates are unacceptable for Xpert Xpress SARS-CoV-2/FLU/RSV testing.  Fact Sheet for Patients: EntrepreneurPulse.com.au  Fact Sheet for Healthcare Providers: IncredibleEmployment.be  This test is not yet approved or cleared by the [place] FDA and has been authorized for detection and/or diagnosis of SARS-CoV-2 by FDA under an Emergency Use Authorization (EUA). This EUA will remain in effect (meaning this test can be used) for the duration of the COVID-19 declaration under Section 564(b)(1) of the Act, 21 U.S.C. section 360bbb-3(b)(1), unless the authorization is terminated or revoked.  Performed at [facility], [address] [place], [place] [address]     Coagulation Studies: Recent Labs    04/22/2021 0447  LABPROT 15.0  INR 1.2     Urinalysis: Recent Labs    04/28/2021 0651  COLORURINE YELLOW*  LABSPEC 1.014  PHURINE 5.0  GLUCOSEU 50*  HGBUR NEGATIVE  BILIRUBINUR NEGATIVE  KETONESUR NEGATIVE  PROTEINUR 100*  NITRITE NEGATIVE  LEUKOCYTESUR NEGATIVE       Imaging: No results found.   Medications:    heparin 1,350 Units/hr (05/04/21 0950)    aspirin EC  81 mg Oral Daily   atorvastatin  80 mg Oral QHS   calcitRIOL  0.25 mcg Oral Daily   carvedilol  25 mg Oral BID WC   cloNIDine  0.1 mg Oral TID   clopidogrel  75 mg Oral Daily   dorzolamide-timolol  1 drop Both Eyes BID   doxycycline  100 mg Oral Q12H   ezetimibe  10 mg Oral Daily   fluticasone furoate-vilanterol  1 puff Inhalation Daily   And  umeclidinium bromide  1 puff Inhalation Daily   furosemide  40 mg Intravenous Daily   gabapentin  100 mg Oral BID   insulin aspart  0-20 Units Subcutaneous TID WC   insulin aspart  0-5 Units Subcutaneous QHS   insulin detemir  30 Units Subcutaneous QHS   isosorbide mononitrate  120 mg Oral Daily   levothyroxine  50 mcg Oral Q0600   montelukast  10 mg Oral Daily   pantoprazole  40 mg Oral Daily   tamsulosin  0.4 mg Oral QHS   cyanocobalamin  1,000 mcg Oral Daily   acetaminophen, albuterol, HYDROcodone-acetaminophen, nitroGLYCERIN, ondansetron (ZOFRAN) IV, polyethylene glycol  Assessment/ Plan:  75 y.o. male with medical problems of longstanding diabetes, hypertension, history of nephrolithiasis, hyperlipidemia, coronary artery disease status post PTCA, pulmonary fibrosis on 4 L chronic oxygen, obstructive sleep apnea, history of right adrenalectomy, left renal artery stenosis status post stent, pulmonary hypertension   Active Problems:   Acute on chronic diastolic CHF (congestive heart failure) (HCC)   Elevated troponin   CHF (congestive heart failure) (HCC)   Unstable  angina (HCC)   Pulmonary hypertension (HCC)   Acute on chronic respiratory failure with hypoxia and hypercapnia (HCC)   CKD (chronic kidney disease), stage IV ([place])   Interstitial lung disease ([place])   B12 deficiency   #. CKD st 4 Recent Labs    04/30/21 0632 04/09/2021 0213 05/03/21 0716 05/04/21 0610  CREATININE 3.03* 3.12* 3.53* 3.72*   Patient has underlying CKD stage IV.  CKD risk factors include diabetes type 2, hypertension, history of IV contrast exposure, atherosclerosis and age Serum creatinine has been fluctuating, Baseline appears to be 2.66/GFR 25 from April 17, 2021 Creatinine and BUN continue to increase.  650 mL of urine documented thus far this morning.  It was discussed with the patient and family yesterday the possible need for renal replacement therapy if condition worsens.  Patient would like to speak with wife, who is home with COVID.  Patient does understand he will need dialysis at some point in the future.  Palliative care consulted to discuss goals of care.  #. Anemia of CKD  Lab Results  Component Value Date   HGB 10.1 (L) 05/03/2021   Hemoglobin at target  #. SHPTH  No results found for: PTH No results found for: PHOS Outpatient PTH 182 from February 08, 2021 Calcium below target of 7.9. Continue on oral calcitriol  #. HTN with CKD and lower extremity edema Blood pressure within acceptable range for this patient at this time Currently getting IV furosemide 40 mg daily Monitor closely  #. Diabetes type 2 with CKD Hgb A1c MFr Bld (%)  Date Value  04/11/2021 7.5 (H)  Management as per primary team Glucose elevated  #Pulmonary hypertension Right heart catheterization December 06, 2020 Pulmonary artery pressure of 44 mm, right ventricular pressure of 48 mm 2D echo from April 18, 2021.  LVEF 60 to 75%, grade 1 diastolic dysfunction, elevated left atrial pressure, moderate thickening of aortic valve, mild calcification of mitral valve.  Cardiology  following and will assess the need for additional testing or procedures     LOS: 1 [facility] 11/2/202210:24 AM  [place], [place]

## 2021-05-04 NOTE — ED Notes (Signed)
Patient provided crackers and diet lemon lime soda. Son at the bedside

## 2021-05-04 NOTE — Progress Notes (Signed)
[place] for  Heparin  Indication: ACS/NSTEMI   Allergies  Allergen Reactions   Calcium Channel Blockers     Would avoid if possible due to h/o peripheral edema    Patient Measurements: Height: 5\' 5"  (165.1 cm) Weight: 114.3 kg (252 lb) IBW/kg (Calculated) : 61.5 Heparin Dosing Weight: 88.1 kg   Vital Signs: BP: 152/61 (11/02 0700) Pulse Rate: 50 (11/02 0700)  Labs: Recent Labs    04/18/2021 0213 04/06/2021 0344 04/28/2021 0447 04/29/2021 1258 04/05/2021 2224 05/03/21 0716 05/04/21 0610  HGB 9.7*  --   --   --   --  10.1*  --   HCT 29.8*  --   --   --   --  32.0*  --   PLT 332  --   --   --   --  302  --   APTT  --   --  31  --   --   --   --   LABPROT  --   --  15.0  --   --   --   --   INR  --   --  1.2  --   --   --   --   HEPARINUNFRC  --   --   --    < > 0.31 0.33 0.29*  CREATININE 3.12*  --   --   --   --  3.53* 3.72*  TROPONINIHS 1,417* 1,514*  --   --   --   --   --    < > = values in this interval not displayed.     Estimated Creatinine Clearance: 20.7 mL/min (A) (by C-G formula based on SCr of 3.72 mg/dL (H)).   Medical History: Past Medical History:  Diagnosis Date   Adrenal gland anomaly    Arthritis    CAD (coronary artery disease)    a. 04/2006 MI and PCI/stenting to mLCx & mRCA; b. 07/2009 Cath: patent LCX/RCA stents;  c. 12/2016 NSTEMI/Cath: LM 30, LAD 30p, 83m, 30d, D1/2/3 min irregs, LCX 180m ISR, OM1 min irregs, RCA 20p, 10 ISR, 61m, 50d, RPDA min irregs, RPAV 40-->Med Rx; d. 12/2017 MV: fixed lateral wall scar, mild anterior/anterior septal ischemia. EF 30-44%.   CHF (congestive heart failure) (HCC)    CKD (chronic kidney disease), stage III (HCC)    Colon polyps    DM2 (diabetes mellitus, type 2) (HCC)    insulin requiring   Dyslipidemia    GERD (gastroesophageal reflux disease)    History of echocardiogram    a. TTE 12/2016: EF 50-55%, mod concentric LVH, images inadequate for wall motion assessment, not  technically sufficienct to allow for LV dias fxn, calcified mitral annulus    HTN (hypertension)    Kidney stones    Left Renal artery stenosis ([place])    a. 07/2013 s/p PTA/stenting ([place]); b. 07/2017 Renal Duplex: No significant RAS.   Myocardial infarction (HCC)    OSA (obstructive sleep apnea)    Pulmonary fibrosis ([place])    a. 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   Recurrent UTI    Rotator cuff disorder    right   Skin cancer    head   Thyroid disorder    Venous insufficiency of both lower extremities    a. s/p laser treatment.     Assessment: Pharmacy consulted to dose heparin in this 75 year old male admitted with ACS/NSTEMI.  No prior  anticoag noted.   Goal of Therapy:  Heparin level 0.3-0.7 units/ml Monitor platelets by anticoagulation protocol: Yes  Date Time HL Rate/comment  10/31  2224 0.31 1200 un/hr 11/1    0716  0.33 1200 un/hr 11/2 0610 0.29 1200 un/hr, subtherapeutic    Plan:  Heparin level 0.29, slightly subtherapeutic Bolus 1300 units x 1 and increase heparin infusion to 1350 un/hr Check heparin level 8 hours after rate change Monitor daily CBC, s/s of bleed   Darnelle Bos, PharmD Clinical Pharmacist 05/04/2021 7:38 AM

## 2021-05-04 NOTE — Consult Note (Addendum)
Consultation Note Date: 05/04/2021   Patient Name: Stephen [name]  DOB: [birth year]  MRN: [id number]  Age / Sex: 75 y.o., male  PCP: [name], MD Referring Physician: Shawna Clamp, MD  Reason for Consultation: Establishing goals of care  HPI/Patient Profile: 75 y.o. male  with past medical history of multivessel coronary disease, chronic heart failure, pulmonary fibrosis, pulmonary hypertension, COPD, chronic kidney disease, diabetes, renal artery stenosis, and interstitial lung disease admitted on 04/06/2021 with acute on chronic heart failure with demand ischemia.  Palliative medicine was consulted to discuss goals of care and symptom management.  Clinical Assessment and Goals of Care: I have reviewed medical records including EPIC notes, labs and imaging, received report from bedside RN Claiborne Billings, assessed the patient and then met with patient and his son at bedside to discuss diagnosis prognosis, [place], EOL wishes, disposition and options.  I introduced Palliative Medicine as specialized medical care for people living with serious illness. It focuses on providing relief from the symptoms and stress of a serious illness. The goal is to improve quality of life for both the patient and the family.  We discussed a brief life review of the patient.  He has been married for 52 years.  He has 1 son and 1 daughter.  He also has 10 grandchildren, 37 grandsons and 1 granddaughter.  He enjoys tinkering and messing around the house.  He also enjoys mowing his 2-1/2 acre lawn.  As far as functional and nutritional status prior to admission he was having difficulty with shortness of breath and completing ADLs independently.  He was recently discharged and returned a few days after discharge.  He has not been home for any extended amount of time recently.  We discussed patient's current illness and what it means in the  larger context of patient's on-going co-morbidities.  I reviewed the significant coronary disease as well as respiratory challenges he is facing.  He met with the nephrologist this morning and understands that a repeat catheterization is not an option.  I shared that his treatment options are limited.  He said he understood this.   I reviewed that he is currently listed as a full code.  I outlined that a full code involves attempts at CPR, cardiogenic shock, and the likelihood that he would need a breathing tube attached to a ventilator for life support.  He shared he would not want that.  He stated that it is no way to live.  He said he and his wife I discussed this in the past.  He said he does not want to remain a full code.  I repeated that if he does not want to be a full code that we would change his CODE STATUS to a DNR.  This would mean allowing a natural death.  He was in agreement with this.  The son at bedside was also in agreement. Code status changed to DNR.  The difference between aggressive medical intervention and comfort care was considered in light of the patient's  goals of care.  Since this was our first meeting, I lightly reviewed his options moving forward.  We discussed a medically aggressive plan of care and a comfort care pathway.  He was tearful during our discussion.  I allowed space and therapeutic silence for him to process these emotions.   I attempted to elicit values and goals of care important to the patient.  He shared he wants to get better so that he can go home and ride his lawnmower.  I told him I wish that we were not in the circumstances but that I am also concerned that his new baseline will not allow him to be as active as he once was.  Given his increased respiratory demands as well as cardiovascular history we reviewed what a new normal may look like for him.  I assessed his pain.  He endorses that he takes 1 tab of pain medicine every 8 hours.  He reports the pain  prior to pain medication administration is 10 out of 10.  Once the pain medication settles and he endorses 8 out of 10 pain is still present.  I asked that his pain medication and made primary team aware of adjustment.  The difference between aggressive medical intervention and comfort care was considered in light of the patient's goals of care.   Discussed with patient/family the importance of continued conversation with family and the medical providers regarding overall plan of care and treatment options, ensuring decisions are within the context of the patient's values and GOCs.    Questions and concerns were addressed. The family was encouraged to call with questions or concerns.   Afternoon update 1500:  Nurse Claiborne Billings contacted me to let me know that when she placed the DNR band on the patient's wrist the daughter at bedside became very upset.  Family requested to speak with me.  I returned to the patient's room.  I spoke with daughter in person at bedside who called the patient's wife on speaker phone.  I again reviewed that the patient has shared that he would not want to live on a ventilator.  I reviewed that a DNR does not mean do not care.  I shared that nothing is changed in his plan of care other than if worse comes to worse in his heart and his lungs stop.  Should he physically pass away we will allow a natural death.  We will also treat any symptoms at end-of-life.  The wife was in agreement with this DNR.  She was concerned that a DNR meant that we were no longer going to attempt to treat the treatable.  I shared that the team is still working closely with physical and Occupational Therapy to determine neck steps for the patient.  Both the daughter at bedside and wife on the phone appreciated this clarification.  Everyone is on board with patient remaining a DNR.  During our discussion, patient shared he does not want to be a burden on his family.  He again was tearful.  I encouraged them to  continue these conversations.  I assured both the patient, as well as the daughter and wife, that I will continue to round on the patient and be a resource for him throughout this hospitalization.  Primary Decision Maker PATIENT  Code Status/Advance Care Planning: DNR Norco 1-2 tabs Q6H PRN for pain Treat the treatable Pt contemplating rehab vs comfort pathway  Prognosis:   Unable to determine  Discharge Planning: To Be Determined  Primary Diagnoses: Present on Admission:  Unstable angina [facility])   Physical Exam Vitals and nursing note reviewed.  Constitutional:      General: He is not in acute distress.    Appearance: He is ill-appearing.  HENT:     Head: Normocephalic and atraumatic.  Cardiovascular:     Rate and Rhythm: Normal rate.     Heart sounds: Normal heart sounds.  Pulmonary:     Comments: SOB with speaking for long periods of time, pt responds in short phrases Abdominal:     Palpations: Abdomen is soft.  Skin:    General: Skin is warm and dry.  Neurological:     Mental Status: He is alert and oriented to person, place, and time.  Psychiatric:        Mood and Affect: Mood normal. Mood is not anxious.        Behavior: Behavior normal. Behavior is not agitated.     Comments: Tearful, sad    Vital Signs: BP (!) 158/62 (BP Location: Right Arm)   Pulse (!) 55   Temp 97.8 F (36.6 C)   Resp 17   Ht _0  (1.651 m)   Wt 114.3 kg   SpO2 98%   BMI 41.93 kg/m  Pain Scale: 0-10   Pain Score: 8  SpO2: SpO2: 98 % O2 Device:SpO2: 98 % O2 Flow Rate: .O2 Flow Rate (L/min): 4 L/min  Palliative Assessment/Data: 50%     I discussed this patient's plan of care with patient, patient's son, patient's daughter, patient's wife, bedside RN Claiborne Billings.  Thank you for this consult. Palliative medicine will continue to follow and assist holistically.   Time Total: 70 minutes Greater than 50%  of this time was spent counseling and coordinating care related to the above  assessment and plan.  Signed by: Jordan Hawks, DNP, FNP-BC Palliative Medicine    Please contact Palliative Medicine Team phone at [phone] for questions and concerns.  For individual provider: See Shea Evans

## 2021-05-04 NOTE — ED Notes (Signed)
Reina RN aware of assigned bed 

## 2021-05-04 NOTE — Evaluation (Signed)
Occupational Therapy Evaluation Patient Details Name: Stephen [name] MRN: [id number] DOB: 1945-09-11 Today's Date: 05/04/2021   History of Present Illness Stephen [name] is a 75 y.o. male with medical history significant for , interstitial lung disease, pulmonary hypertension, OSA with chronic respiratory failure on home O2 at 4L and Bip[place], followed by pulmonology, as well as diastolic heart failure, CAD, DM, CKD IV, recently hospitalized from 10/16-10/29 with NSTEMI treated with heparin infusion, and respiratory failure secondary to CHF and COPD exacerbation who returns to the ED 2 days later with complaints of chest pain and shortness of breath. Chest pain was severe heaviness on his chest.EMS reports sats of 91% on his normal 4 L.  They attempted to increase it to 6 L with minimal improvement to 93% and and then put him on a nonrebreather and sats were 100%.  Wife contributes to history .   Clinical Impression   Pt seen for OT evaluation this date in setting of re-hospitalization d/t SOB. Pt presents this date with decreased strength and activity tolerance. Pt requires increased time and MOD A +2 to come to EOB sitting. Pt demos F sitting balance. Pt with increased RR with all mobilization, but sats noted to be stable throughout session. In addition, pt c/o dizziness with transition to sitting, but BP taken and noted to be stable, somewhat elevated. ON ADL assessment, pt requires: SETUP to MIN A for UB ADLs, MOD/MAX A for LB ADLs, MOD A +2 for STS with RW from elevated surface. Pt and family have equipment and support at home from last hospital stay. Pt would benefit from continued OT f/u in acute setting as well as f/u in home setting upon d/c from hospital. [employer] and frequent supervision/support from family.     Recommendations for follow up therapy are one component of a multi-disciplinary discharge planning process, led by the attending physician.  Recommendations may be updated based  on patient status, additional functional criteria and insurance authorization.   Follow Up Recommendations  Home health OT    Assistance Recommended at Discharge Frequent or constant Supervision/Assistance  Functional Status Assessment  Patient has had a recent decline in their functional status and demonstrates the ability to make significant improvements in function in a reasonable and predictable amount of time.  Equipment Recommendations  BSC;Tub/shower seat    Recommendations for Other Services       Precautions / Restrictions Precautions Precautions: Fall Restrictions Weight Bearing Restrictions: No Other Position/Activity Restrictions: still with lingering pain in L UE from thrombophlebitis      Mobility Bed Mobility Overal bed mobility: Needs Assistance Bed Mobility: Supine to Sit;Sit to Supine Rolling: +2 for physical assistance;Max assist   Supine to sit: Max assist;+2 for physical assistance Sit to supine: Max assist;+2 for physical assistance   General bed mobility comments: increased time, cues for hand placement/use of bed rails, HOB elevated.    Transfers Overall transfer level: Needs assistance Equipment used: Rolling walker (2 wheels) Transfers: Sit to/from Stand Sit to Stand: Mod assist;+2 physical assistance;From elevated surface           General transfer comment: increased time, cues for use of RW, rock to gain momentum      Balance Overall balance assessment: Needs assistance Sitting-balance support: Feet supported Sitting balance-Leahy Scale: Fair     Standing balance support: Bilateral upper extremity supported;During functional activity Standing balance-Leahy Scale: Poor Standing balance comment: reqires UE support and CGA for static stand, fearful to attempt steps and knees  noted to be buckling d/t weakness                           ADL either performed or assessed with clinical judgement   ADL Overall ADL's : Needs  assistance/impaired                                       General ADL Comments: SETUP to MIN A for seated UB ADLs, MOD/MAX A for bed level LB ADLs-MAX/TOTAL A for sitting LB ADLs including donning socks. MIN/MOD A +2 for STS wtih RW from elevated surface.     Vision Patient Visual Report: No change from baseline       Perception     Praxis      Pertinent Vitals/Pain Pain Assessment: Faces Faces Pain Scale: Hurts a little bit Pain Location: L hand/wrist Pain Descriptors / Indicators: Grimacing;Guarding;Discomfort Pain Intervention(s): Limited activity within patient's tolerance;Monitored during session;Repositioned     Hand Dominance Right   Extremity/Trunk Assessment Upper Extremity Assessment Upper Extremity Assessment: RUE deficits/detail;LUE deficits/detail RUE Deficits / Details: ROM to only 1/2 range, MMT shld grossly 3-/5, MMT grip grossly 4-/5. LUE Deficits / Details: decreased tolerance for moving L UE d/t ongoing pain since blood gas procedure last hospital stay.   Lower Extremity Assessment Lower Extremity Assessment: Defer to PT evaluation;Generalized weakness   Cervical / Trunk Assessment Cervical / Trunk Assessment:  (HEAD FWD)   Communication Communication Communication: No difficulties   Cognition Arousal/Alertness: Awake/alert Behavior During Therapy: WFL for tasks assessed/performed;Flat affect Overall Cognitive Status: Within Functional Limits for tasks assessed                                 General Comments: not very conversive, increased processing time     General Comments       Exercises Other Exercises Other Exercises: OT ed with pt re: importance of OOB Activity and role   Shoulder Instructions      Home Living Family/patient expects to be discharged to:: Private residence (Simultaneous filing. User may not have seen previous data.) Living Arrangements: Spouse/significant other;Children (Simultaneous  filing. User may not have seen previous data.) Available Help at Discharge: Family;Available 24 hours/day Type of Home: House Home Access: Ramped entrance     Home Layout: One level     Bathroom Shower/Tub: Teacher, early years/pre: Standard     Home Equipment: Conservation officer, nature (2 wheels);Hospital bed;Other (comment) (hoyer)          Prior Functioning/Environment Prior Level of Function : Needs assist  Cognitive Assist : Mobility (cognitive) Mobility (Cognitive): Set up cues   Physical Assist : Mobility (physical);ADLs (physical) Mobility (physical): Bed mobility;Transfers ADLs (physical): Bathing;Dressing;Toileting;IADLs Mobility Comments: was walking before last hosptial stay. was not OOB at home between hosptializations. 2p assist for bed mobility and stand today, was able to stand with 1p assist from elevated surface before end of last hospital stay. ADLs Comments: was INDEP for bathing/dressing and other basic ADLs until last hospitalziation, no requiring family assist for bathing/dressing and all IADLs d/t low activity tolerance.        OT Problem List: Decreased strength;Decreased activity tolerance;Impaired balance (sitting and/or standing);Pain;Decreased knowledge of use of DME or AE;Obesity      OT Treatment/Interventions: Self-care/ADL training;Therapeutic exercise;Therapeutic activities;Energy conservation;Patient/family education;Balance  training;Manual therapy;DME and/or AE instruction    OT Goals(Current goals can be found in the care plan section) Acute Rehab OT Goals OT Goal Formulation: With patient/family Time For Goal Achievement: 05/18/21 Potential to Achieve Goals: Fair ADL Goals Pt Will Perform Lower Body Dressing: with min assist;with mod assist;with adaptive equipment;sit to/from stand (to perform clothing mgt over hips with pants/underwear) Pt Will Transfer to Toilet: with min assist;stand pivot transfer;bedside commode Pt Will Perform  Toileting - Clothing Manipulation and hygiene: with min assist;sitting/lateral leans Pt/caregiver will Perform Home Exercise Program: Increased strength;Both right and left upper extremity;With minimal assist (mindful of painful L UE)  OT Frequency: Min 2X/week   Barriers to D/C:            Co-evaluation PT/OT/SLP Co-Evaluation/Treatment: Yes Reason for Co-Treatment: Complexity of the patient's impairments (multi-system involvement);For patient/therapist safety;To address functional/ADL transfers PT goals addressed during session: Mobility/safety with mobility OT goals addressed during session: ADL's and self-care      AM-PAC OT "6 Clicks" Daily Activity     Outcome Measure Help from another person eating meals?: A Little Help from another person taking care of personal grooming?: A Little Help from another person toileting, which includes using toliet, bedpan, or urinal?: Total Help from another person bathing (including washing, rinsing, drying)?: A Lot Help from another person to put on and taking off regular upper body clothing?: A Little Help from another person to put on and taking off regular lower body clothing?: A Lot 6 Click Score: 14   End of Session Equipment Utilized During Treatment: Oxygen;Gait belt;Rolling walker (2 wheels) Nurse Communication: Mobility status  Activity Tolerance: Patient limited by pain;Other (comment) Patient left: in bed;with call bell/phone within reach;with bed alarm set;with family/visitor present  OT Visit Diagnosis: Other abnormalities of gait and mobility (R26.89);Muscle weakness (generalized) (M62.81);Pain Pain - Right/Left: Left Pain - part of body: Arm                Time: 1102-1117 OT Time Calculation (min): 26 min Charges:  OT General Charges $OT Visit: 1 Visit OT Evaluation $OT Eval Moderate Complexity: [place], MS, OTR/L ascom [phone] 05/04/21, 6:31 PM

## 2021-05-04 NOTE — Progress Notes (Signed)
Inpatient Diabetes Program Recommendations  AACE/ADA: New Consensus Statement on Inpatient Glycemic Control (2015)  Target Ranges:  Prepandial:   less than 140 mg/dL      Peak postprandial:   less than 180 mg/dL (1-2 hours)      Critically ill patients:  140 - 180 mg/dL   Lab Results  Component Value Date   GLUCAP 215 (H) 05/04/2021   HGBA1C 7.5 (H) 04/06/2021    Review of Glycemic Control Results for Stephen [name], Stephen [name] (MRN [id number]) as of 05/04/2021 12:46  Ref. Range 05/03/2021 11:03 05/03/2021 18:32 05/04/2021 00:02 05/04/2021 08:02 05/04/2021 11:14  Glucose-Capillary Latest Ref Range: 70 - 99 mg/dL 242 (H) 256 (H) 217 (H) 195 (H) 215 (H)   Diabetes history: DM 2 Outpatient Diabetes medications: Novolog 8 units tid with meals, Levemir 30 units q HS Current orders for Inpatient glycemic control:  Novolog resistant tid with meals and HS Levemir 30 units q HS  Inpatient Diabetes Program Recommendations:   May consider adding Novolog meal coverage 3 units tid with meals (hold if patient eats less than 50% or NPO).   Thanks,  Adah Perl, RN, BC-ADM Inpatient Diabetes Coordinator Pager [phone]  (8a-5p)

## 2021-05-04 NOTE — Evaluation (Signed)
Physical Therapy Evaluation Patient Details Name: Stephen [name] MRN: [id number] DOB: 12-11-1945 Today's Date: 05/04/2021  History of Present Illness  Stephen [name] is a 75 y.o. male with medical history significant for , interstitial lung disease, pulmonary hypertension, OSA with chronic respiratory failure on home O2 at 4L and Bipap, followed by pulmonology, as well as diastolic heart failure, CAD, DM, CKD IV, recently hospitalized from 10/16-10/29 with NSTEMI treated with heparin infusion, and respiratory failure secondary to CHF and COPD exacerbation who returns to the ED 2 days later with complaints of chest pain and shortness of breath. Chest pain was severe heaviness on his chest.EMS reports sats of 91% on his normal 4 L.  They attempted to increase it to 6 L with minimal improvement to 93% and and then put him on a nonrebreather and sats were 100%.  Wife contributes to history .   Clinical Impression  Patient requires max +2 assist for bed mobility and mod +2 for sit to stand from elevated surface. He is fearful during mobility. Patient will continue to benefit from skilled PT while here to improve strength and functional independence to reduce caregiver burden.       u Recommendations for follow up therapy are one component of a multi-disciplinary discharge planning process, led by the attending physician.  Recommendations may be updated based on patient status, additional functional criteria and insurance authorization.  Follow Up Recommendations Home health PT    Assistance Recommended at Discharge Frequent or constant Supervision/Assistance  Functional Status Assessment Patient has had a recent decline in their functional status and demonstrates the ability to make significant improvements in function in a reasonable and predictable amount of time.  Equipment Recommendations       Recommendations for Other Services       Precautions / Restrictions Precautions Precautions:  Fall Restrictions Weight Bearing Restrictions: No      Mobility  Bed Mobility Overal bed mobility: Needs Assistance Bed Mobility: Supine to Sit;Sit to Supine Rolling: Max assist;+2 for physical assistance   Supine to sit: Max assist;+2 for physical assistance Sit to supine: Max assist;+2 for physical assistance   General bed mobility comments: increased time, cues for hand placement/use of bed rails, HOB elevated.    Transfers Overall transfer level: Needs assistance Equipment used: Rolling walker (2 wheels) Transfers: Sit to/from Stand Sit to Stand: Mod assist;+2 physical assistance;From elevated surface                Ambulation/Gait             General Gait Details: unable due to weakness  Stairs            Wheelchair Mobility    Modified Rankin (Stroke Patients Only)       Balance Overall balance assessment: Needs assistance Sitting-balance support: Feet supported Sitting balance-Leahy Scale: Fair     Standing balance support: Bilateral upper extremity supported;During functional activity Standing balance-Leahy Scale: Fair Standing balance comment: requires UE support and external support of CGA for standing.                             Pertinent Vitals/Pain Pain Assessment: Faces Faces Pain Scale: Hurts a little bit Pain Location: L hand/wrist Pain Descriptors / Indicators: Grimacing;Guarding;Discomfort Pain Intervention(s): Monitored during session;Limited activity within patient's tolerance;Repositioned    Home Living Family/patient expects to be discharged to:: Private residence Living Arrangements: Spouse/significant other;Children Available Help at Discharge: Family;Available 24  hours/day Type of Home: House Home Access: Ramped entrance       Home Layout: One level Home Equipment: Agricultural consultant (2 wheels);Hospital bed;Other (comment) (hoyer lift)      Prior Function Prior Level of Function : Needs assist   Cognitive Assist : Mobility (cognitive) Mobility (Cognitive): Set up cues   Physical Assist : Mobility (physical) Mobility (physical): Bed mobility;Transfers   Mobility Comments: requires max +2 for bed mobility, mod +2 for sit to stand from elevated surface       Hand Dominance   Dominant Hand: Right    Extremity/Trunk Assessment   Upper Extremity Assessment Upper Extremity Assessment: Defer to OT evaluation    Lower Extremity Assessment Lower Extremity Assessment: Generalized weakness    Cervical / Trunk Assessment Cervical / Trunk Assessment: Normal  Communication   Communication: No difficulties  Cognition Arousal/Alertness: Awake/alert Behavior During Therapy: WFL for tasks assessed/performed;Flat affect Overall Cognitive Status: Within Functional Limits for tasks assessed                                 General Comments: requires extra time to follow commands        General Comments      Exercises     Assessment/Plan    PT Assessment Patient needs continued PT services  PT Problem List Obesity;Decreased activity tolerance;Decreased balance;Decreased mobility;Decreased strength;Pain       PT Treatment Interventions DME instruction;Therapeutic exercise;Gait training;Balance training;Neuromuscular re-education;Functional mobility training;Therapeutic activities;Patient/family education    PT Goals (Current goals can be found in the Care Plan section)  Acute Rehab PT Goals Patient Stated Goal: go home PT Goal Formulation: With patient/family Time For Goal Achievement: 05/18/21 Potential to Achieve Goals: Fair    Frequency Min 2X/week   Barriers to discharge        Co-evaluation PT/OT/SLP Co-Evaluation/Treatment: Yes Reason for Co-Treatment: For patient/therapist safety;To address functional/ADL transfers PT goals addressed during session: Mobility/safety with mobility;Balance         AM-PAC PT "6 Clicks" Mobility  Outcome  Measure Help needed turning from your back to your side while in a flat bed without using bedrails?: A Lot Help needed moving from lying on your back to sitting on the side of a flat bed without using bedrails?: A Lot Help needed moving to and from a bed to a chair (including a wheelchair)?: Total Help needed standing up from a chair using your arms (e.g., wheelchair or bedside chair)?: A Lot Help needed to walk in hospital room?: Total Help needed climbing 3-5 steps with a railing? : Total 6 Click Score: 9    End of Session Equipment Utilized During Treatment: Oxygen;Gait belt Activity Tolerance: Patient limited by fatigue Patient left: in bed;with call bell/phone within reach;with bed alarm set Nurse Communication: Mobility status PT Visit Diagnosis: Other abnormalities of gait and mobility (R26.89);Muscle weakness (generalized) (M62.81);Difficulty in walking, not elsewhere classified (R26.2)    Time: 6578-4696 PT Time Calculation (min) (ACUTE ONLY): 27 min   Charges:   PT Evaluation $PT Eval Moderate Complexity: 1 Mod          Torell Minder, PT, GCS 05/04/21,3:28 PM

## 2021-05-04 NOTE — Progress Notes (Signed)
PROGRESS NOTE    Stephen [name]  WUJ:[id number] DOB: 1946-02-11 DOA: 04/13/2021 PCP: [name], MD    Brief Narrative:  This 75 year old man who is readmitted with shortness of breath and acute on chronic diastolic congestive heart failure with pulmonary hypertension. The patient's creatinine has worsened with diuresis. Patient empirically placed on heparin drip.  Cardiology and nephrology following. He is  readmitted after a long hospital course.  Patient was treated medically for NSTEMI.  Patient has acute on chronic hypoxic hypercapnic respiratory failure on BiPAP at night.  Also has pulmonary hypertension and interstitial lung disease chronically on 4 L of oxygen.  Patient has acute on chronic diastolic congestive heart failure and creatinine is very sensitive to diuretics.  Patient was also treated for UTI during the hospital course and acute metabolic encephalopathy and received 7 days of Rocephin for E. coli and Serratia in the urine. He is started on doxycycline for erythema of the left antecubital fossa to cover staph aureus from recent IV site.  Patient also had a superficial thrombophlebitis left arm which was treated with conservative management.  Assessment & Plan:   Active Problems:   Acute on chronic diastolic CHF (congestive heart failure) (HCC)   Elevated troponin   CHF (congestive heart failure) (HCC)   Unstable angina (HCC)   Pulmonary hypertension (HCC)   Acute on chronic respiratory failure with hypoxia and hypercapnia (HCC)   CKD (chronic kidney disease), stage IV (HCC)   Interstitial lung disease (HCC)   B12 deficiency   Acute on chronic diastolic congestive heart failure: Patient was started on Lasix IV 40 mg twice daily but due to increased serum creatinine it is reduced if 40 mg daily. Continue IV Lasix 40 mg daily.  Cardiology is following. Continue Coreg.  Monitor intake output charting.  NSTEMI: Patient presented with elevated troponin with  history of CAD and recent NSTEMI. Completed heparin drip for 48 hours. Continue Imdur, Coreg, Plavix, atorvastatin. Maximize medical management with chronic kidney disease. Cardiology think LHC or possible PCI will not improve patient's symptoms at this is largely due to pulmonary fibrosis.  Acute on chronic hypoxic and hypercapnic respiratory failure: Patient has a history of pulmonary hypertension and interstial lung disease. Continue BiPAP as needed and nightly.  Continue supplemental oxygen 4 to 6 L during daytime.  Acute kidney injury on CKD stage IV: Serum creatinine up to 3.53 with diuresis. Continue to monitor serum creatinine. Nephrology is following,  patient is at high risk for renal replacement therapy.  Type 2 diabetes: Continue Levemir 30 units at night and regular insulin sliding scale.  Obstructive sleep apnea with morbid obesity: Continue CPAP at night.  Superficial thrombophlebitis left arm: Continue conservative management. Patient was on heparin drip for 48 hours. Continue doxycycline for few days. Erythema has significantly improved.  Vitamin B12 deficiency:  Continue oral supplementation.  Palliative care consultation: Patient overall prognosis is poor.   Difficult to balance CHF, acute kidney injury on CKD, chronic hypoxic hypercarbic respiratory failure,end-stage lung disease with pulmonary fibrosis.  Generalized weakness: PT and OT eval.   DVT prophylaxis: Heparin sq Code Status:DNR Family Communication: Son at bed side,. Disposition Plan:   Status is: Inpatient  Remains inpatient appropriate because: AKI, CHF.  Needs goals of care discussion.  May require renal replacement therapy.  Consultants:  Nephrology Cardiology  Procedures:   Antimicrobials:   Anti-infectives (From admission, onward)    Start     Dose/Rate Route Frequency Ordered Stop   04/11/2021 1000  doxycycline (VIBRA-TABS) tablet 100 mg        100 mg Oral Every 12 hours  04/13/2021 0725 05/05/21 2359        Subjective: Patient was seen and examined at bedside.  Overnight events noted.   Patient appears significantly deconditioned.  Remains on 4 L of supplemental oxygen.  Objective: Vitals:   05/04/21 0700 05/04/21 0948 05/04/21 1117 05/04/21 1522  BP: (!) 152/61 (!) 148/59 (!) 158/62   Pulse: (!) 50 (!) 55 (!) 55   Resp: 13 18 17    Temp:  98.1 F (36.7 C) 97.8 F (36.6 C)   TempSrc:      SpO2: 100% 96% 98% 93%  Weight:      Height:        Intake/Output Summary (Last 24 hours) at 05/04/2021 1534 Last data filed at 05/04/2021 1117 Gross per 24 hour  Intake 240 ml  Output 1450 ml  Net -1210 ml   Filed Weights   04/29/2021 0211  Weight: 114.3 kg    Examination:  General exam: Appears calm and comfortable , not in any acute distress,  appears deconditioned. Respiratory system: Clear to auscultation. Respiratory effort normal. Cardiovascular system: S1-S2 heard, regular rate and rhythm, no murmur. Gastrointestinal system: Abdomen is soft, nontender, nondistended, BS+ Central nervous system: Alert and oriented x 3. No focal neurological deficits. Extremities: No edema, no cyanosis, no clubbing. Skin: No rashes, lesions or ulcers Psychiatry: Judgement and insight appear normal. Mood & affect appropriate.     Data Reviewed: I have personally reviewed following labs and imaging studies  CBC: Recent Labs  Lab 04/13/2021 0213 05/03/21 0716  WBC 11.6* 11.2*  NEUTROABS 9.8*  --   HGB 9.7* 10.1*  HCT 29.8* 32.0*  MCV 87.6 87.9  PLT 332 302   Basic Metabolic Panel: Recent Labs  Lab 04/29/21 0839 04/30/21 0632 04/13/2021 0213 05/03/21 0716 05/04/21 0610  NA 134* 134* 135 136 136  K 4.0 3.8 4.5 4.6 4.4  CL 99 100 99 99 99  CO2 27 28 28 26 31   GLUCOSE 174* 150* 241* 223* 218*  BUN 85* 84* 72* 67* 72*  CREATININE 3.45* 3.03* 3.12* 3.53* 3.72*  CALCIUM 8.0* 8.0* 8.2* 8.0* 7.9*   GFR: Estimated Creatinine Clearance: 20.7 mL/min (A)  (by C-G formula based on SCr of 3.72 mg/dL (H)). Liver Function Tests: No results for input(s): AST, ALT, ALKPHOS, BILITOT, PROT, ALBUMIN in the last 168 hours. No results for input(s): LIPASE, AMYLASE in the last 168 hours. No results for input(s): AMMONIA in the last 168 hours. Coagulation Profile: Recent Labs  Lab 04/06/2021 0447  INR 1.2   Cardiac Enzymes: No results for input(s): CKTOTAL, CKMB, CKMBINDEX, TROPONINI in the last 168 hours. BNP (last 3 results) No results for input(s): PROBNP in the last 8760 hours. HbA1C: Recent Labs    04/02/2021 0651  HGBA1C 7.5*   CBG: Recent Labs  Lab 05/03/21 1103 05/03/21 1832 05/04/21 0002 05/04/21 0802 05/04/21 1114  GLUCAP 242* 256* 217* 195* 215*   Lipid Profile: No results for input(s): CHOL, HDL, LDLCALC, TRIG, CHOLHDL, LDLDIRECT in the last 72 hours. Thyroid Function Tests: No results for input(s): TSH, T4TOTAL, FREET4, T3FREE, THYROIDAB in the last 72 hours. Anemia Panel: No results for input(s): VITAMINB12, FOLATE, FERRITIN, TIBC, IRON, RETICCTPCT in the last 72 hours. Sepsis Labs: No results for input(s): PROCALCITON, LATICACIDVEN in the last 168 hours.  Recent Results (from the past 240 hour(s))  CULTURE, BLOOD (ROUTINE X 2) w Reflex to  ID Panel     Status: None   Collection Time: 04/25/21  3:33 PM   Specimen: BLOOD  Result Value Ref Range Status   Specimen Description BLOOD BLOOD RIGHT WRIST  Final   Special Requests   Final    BOTTLES DRAWN AEROBIC AND ANAEROBIC Blood Culture adequate volume   Culture   Final    NO GROWTH 5 DAYS Performed at [facility], [address] [place], [place] [address]    Report Status 04/30/2021 FINAL  Final  CULTURE, BLOOD (ROUTINE X 2) w Reflex to ID Panel     Status: None   Collection Time: 04/25/21  3:38 PM   Specimen: BLOOD  Result Value Ref Range Status   Specimen Description BLOOD BLOOD RIGHT HAND  Final   Special Requests   Final    BOTTLES DRAWN AEROBIC AND  ANAEROBIC Blood Culture adequate volume   Culture   Final    NO GROWTH 5 DAYS Performed at [facility], [address] [place], [place] [address]    Report Status 04/30/2021 FINAL  Final  Resp Panel by RT-PCR (Flu A&B, Covid)     Status: None   Collection Time: 2021/05/05 10:46 AM  Result Value Ref Range Status   SARS Coronavirus 2 by RT PCR NEGATIVE NEGATIVE Final    Comment: (NOTE) SARS-CoV-2 target nucleic acids are NOT DETECTED.  The SARS-CoV-2 RNA is generally detectable in upper respiratory specimens during the acute phase of infection. The lowest concentration of SARS-CoV-2 viral copies this assay can detect is 138 copies/mL. A negative result does not preclude SARS-Cov-2 infection and should not be used as the sole basis for treatment or other patient management decisions. A negative result may occur with  improper specimen collection/handling, submission of specimen other than nasopharyngeal swab, presence of viral mutation(s) within the areas targeted by this assay, and inadequate number of viral copies(<138 copies/mL). A negative result must be combined with clinical observations, patient history, and epidemiological information. The expected result is Negative.  Fact Sheet for Patients:  BloggerCourse.com  Fact Sheet for Healthcare Providers:  SeriousBroker.it  This test is no t yet approved or cleared by the [place] FDA and  has been authorized for detection and/or diagnosis of SARS-CoV-2 by FDA under an Emergency Use Authorization (EUA). This EUA will remain  in effect (meaning this test can be used) for the duration of the COVID-19 declaration under Section 564(b)(1) of the Act, 21 U.S.C.section 360bbb-3(b)(1), unless the authorization is terminated  or revoked sooner.       Influenza A by PCR NEGATIVE NEGATIVE Final   Influenza B by PCR NEGATIVE NEGATIVE Final    Comment: (NOTE) The Xpert  Xpress SARS-CoV-2/FLU/RSV plus assay is intended as an aid in the diagnosis of influenza from Nasopharyngeal swab specimens and should not be used as a sole basis for treatment. Nasal washings and aspirates are unacceptable for Xpert Xpress SARS-CoV-2/FLU/RSV testing.  Fact Sheet for Patients: BloggerCourse.com  Fact Sheet for Healthcare Providers: SeriousBroker.it  This test is not yet approved or cleared by the [place] FDA and has been authorized for detection and/or diagnosis of SARS-CoV-2 by FDA under an Emergency Use Authorization (EUA). This EUA will remain in effect (meaning this test can be used) for the duration of the COVID-19 declaration under Section 564(b)(1) of the Act, 21 U.S.C. section 360bbb-3(b)(1), unless the authorization is terminated or revoked.  Performed at [facility], [address] [place], [place] [address]  Radiology Studies: No results found.  Scheduled Meds:  aspirin EC  81 mg Oral Daily   atorvastatin  80 mg Oral QHS   calcitRIOL  0.25 mcg Oral Daily   carvedilol  25 mg Oral BID WC   cloNIDine  0.1 mg Oral TID   clopidogrel  75 mg Oral Daily   dorzolamide-timolol  1 drop Both Eyes BID   doxycycline  100 mg Oral Q12H   enoxaparin (LOVENOX) injection  30 mg Subcutaneous Q24H   ezetimibe  10 mg Oral Daily   fluticasone furoate-vilanterol  1 puff Inhalation Daily   And   umeclidinium bromide  1 puff Inhalation Daily   furosemide  40 mg Intravenous Daily   gabapentin  100 mg Oral BID   insulin aspart  0-20 Units Subcutaneous TID WC   insulin aspart  0-5 Units Subcutaneous QHS   insulin detemir  30 Units Subcutaneous QHS   isosorbide mononitrate  120 mg Oral Daily   levothyroxine  50 mcg Oral Q0600   montelukast  10 mg Oral Daily   pantoprazole  40 mg Oral Daily   tamsulosin  0.4 mg Oral QHS   cyanocobalamin  1,000 mcg Oral Daily   Continuous Infusions:   LOS: 1 day     Time spent: 35 mins    Enis Riecke, MD [facility]   If 7PM-7AM, please contact night-coverage

## 2021-05-04 NOTE — Progress Notes (Signed)
Progress Note  Patient Name: Stephen [name] Date of Encounter: 05/04/2021  [facility] Cardiologist: Ida Rogue, MD   Subjective   Shortness of breath is about the same  Inpatient Medications    Scheduled Meds:  aspirin EC  81 mg Oral Daily   atorvastatin  80 mg Oral QHS   calcitRIOL  0.25 mcg Oral Daily   carvedilol  25 mg Oral BID WC   cloNIDine  0.1 mg Oral TID   clopidogrel  75 mg Oral Daily   dorzolamide-timolol  1 drop Both Eyes BID   doxycycline  100 mg Oral Q12H   enoxaparin (LOVENOX) injection  30 mg Subcutaneous Q24H   ezetimibe  10 mg Oral Daily   fluticasone furoate-vilanterol  1 puff Inhalation Daily   And   umeclidinium bromide  1 puff Inhalation Daily   furosemide  40 mg Intravenous Daily   gabapentin  100 mg Oral BID   insulin aspart  0-20 Units Subcutaneous TID WC   insulin aspart  0-5 Units Subcutaneous QHS   insulin detemir  30 Units Subcutaneous QHS   isosorbide mononitrate  120 mg Oral Daily   levothyroxine  50 mcg Oral Q0600   montelukast  10 mg Oral Daily   pantoprazole  40 mg Oral Daily   tamsulosin  0.4 mg Oral QHS   cyanocobalamin  1,000 mcg Oral Daily   Continuous Infusions:  PRN Meds: acetaminophen, albuterol, HYDROcodone-acetaminophen, nitroGLYCERIN, ondansetron (ZOFRAN) IV, polyethylene glycol   Vital Signs    Vitals:   05/04/21 0630 05/04/21 0700 05/04/21 0948 05/04/21 1117  BP: (!) 168/70 (!) 152/61 (!) 148/59 (!) 158/62  Pulse: (!) 54 (!) 50 (!) 55 (!) 55  Resp: 14 13 18 17   Temp:   98.1 F (36.7 C) 97.8 F (36.6 C)  TempSrc:      SpO2: 100% 100% 96% 98%  Weight:      Height:        Intake/Output Summary (Last 24 hours) at 05/04/2021 1410 Last data filed at 05/04/2021 1117 Gross per 24 hour  Intake 240 ml  Output 1450 ml  Net -1210 ml   Last 3 Weights 04/26/2021 04/30/2021 04/29/2021  Weight (lbs) 252 lb 270 lb 8.1 oz 272 lb 14.9 oz  Weight (kg) 114.306 kg 122.7 kg 123.8 kg  Some encounter information is  confidential and restricted. Go to Review Flowsheets activity to see all data.      Telemetry    Sinus bradycardia, heart rate 56- Personally Reviewed  ECG     - Personally Reviewed  Physical Exam   GEN: No acute distress.  Appears frail, tired, soft-spoken Neck: No JVD Cardiac: RRR, Respiratory: Diminished breath sounds at bases GI: Soft, nontender, non-distended  MS: 1+ edema; No deformity. Neuro:  Nonfocal  Psych: Normal affect   Labs    High Sensitivity Troponin:   Recent Labs  Lab 04/18/21 0505 04/18/21 0658 04/18/21 1240 04/08/2021 0213 04/06/2021 0344  TROPONINIHS 2,129* 1,997* 1,842* 1,417* 1,514*     Chemistry Recent Labs  Lab 04/08/2021 0213 05/03/21 0716 05/04/21 0610  NA 135 136 136  K 4.5 4.6 4.4  CL 99 99 99  CO2 28 26 31   GLUCOSE 241* 223* 218*  BUN 72* 67* 72*  CREATININE 3.12* 3.53* 3.72*  CALCIUM 8.2* 8.0* 7.9*  GFRNONAA 20* 17* 16*  ANIONGAP 8 11 6     Lipids No results for input(s): CHOL, TRIG, HDL, LABVLDL, LDLCALC, CHOLHDL in the last 168 hours.  Hematology  Recent Labs  Lab 04/19/2021 0213 05/03/21 0716  WBC 11.6* 11.2*  RBC 3.40* 3.64*  HGB 9.7* 10.1*  HCT 29.8* 32.0*  MCV 87.6 87.9  MCH 28.5 27.7  MCHC 32.6 31.6  RDW 15.9* 15.7*  PLT 332 302   Thyroid No results for input(s): TSH, FREET4 in the last 168 hours.  BNP Recent Labs  Lab 04/04/2021 0214  BNP 1,205.9*    DDimer No results for input(s): DDIMER in the last 168 hours.   Radiology    No results found.  Cardiac Studies   Echo 04/18/2021 1. Left ventricular ejection fraction, by estimation, is 60 to 65%. The  left ventricle has normal function. Left ventricular endocardial border  not optimally defined to evaluate regional wall motion. The left  ventricular internal cavity size was mildly  dilated. There is mild left ventricular hypertrophy. Left ventricular  diastolic parameters are consistent with Grade I diastolic dysfunction  (impaired relaxation). Elevated  left atrial pressure.   2. Right ventricular systolic function is normal. The right ventricular  size is not well visualized.   3. The mitral valve is abnormal. Trivial mitral valve regurgitation. Mild  mitral stenosis. The mean mitral valve gradient is 5.0 mmHg.   4. Tricuspid valve regurgitation not well assessed.   5. The aortic valve has an indeterminant number of cusps. There is mild  calcification of the aortic valve. There is moderate thickening of the  aortic valve. Aortic valve regurgitation is trivial. Mild to moderate  aortic valve sclerosis/calcification is  present, without any evidence of aortic stenosis.   LHC 08/2020 Left mainstem:   Large vessel that bifurcates into the LAD and left circumflex  Left anterior descending (LAD):   Large vessel that extends to the apical region, diagonal branch 2 of moderate size, moderate proximal and mid LAD disease.  Moderate ostial diagonal disease D1-D2  Left circumflex (LCx):  Large vessel with OM branch 2, prior stent mid vessel, occluded at this location, collaterals from left to left, right to left  Right coronary artery (RCA):  Right dominant vessel with PL and PDA, moderate to severe mid vessel disease 70%, 50% distal disease  Left ventriculography: Crossed for pressures, LV gram not performed  Patient Profile     75 y.o. male with history of CAD, HFpEF, COPD, pulmonary fibrosis, respiratory failure on home oxygen, CKD 4, diabetes being seen for shortness of breath and HFpEF.  Assessment & Plan    HFpEF -1+ edema lower extremity -Continue IV Lasix 40 mg daily -Symptoms of shortness of breath likely due to pulmonary etiology  2.  NSTEMI -S/p Heparin x48 hours -Recent left heart cath 08/2020 moderate RCA, LAD, CTO mid left circumflex. -I do not believe left heart cath and possible PCI (if sig stenosis is found)will improve patient's symptoms of shortness of breath as this is largely due to COPD/pulmonary fibrosis in  etiology. -Discussed in detail with patient and son, patient plans to discuss with family as heart cath may also worsen renal function leading to dialysis, patient is not ready for this as of yet. -Continue aspirin, Lipitor, Imdur, Coreg, Plavix.  3.  COPD, pulmonary fibrosis -CPAP, oxygenation, inhalers as per pulmonary medicine, hospitalist team  Etiology for shortness of breath multifactorial including interstitial lung disease, OSA, obesity, deconditioning, HFpEF.  Continue goals of care discussions with patient and family.  Total encounter time 35 minutes  Greater than 50% was spent in counseling and coordination of care with the patient    Signed, Aaron Edelman  Agbor-Etang, MD  05/04/2021, 2:10 PM

## 2021-05-05 DIAGNOSIS — N184 Chronic kidney disease, stage 4 (severe): Secondary | ICD-10-CM | POA: Diagnosis not present

## 2021-05-05 DIAGNOSIS — R0789 Other chest pain: Secondary | ICD-10-CM | POA: Diagnosis not present

## 2021-05-05 DIAGNOSIS — I214 Non-ST elevation (NSTEMI) myocardial infarction: Secondary | ICD-10-CM

## 2021-05-05 DIAGNOSIS — L8990 Pressure ulcer of unspecified site, unspecified stage: Secondary | ICD-10-CM | POA: Insufficient documentation

## 2021-05-05 DIAGNOSIS — I5032 Chronic diastolic (congestive) heart failure: Secondary | ICD-10-CM

## 2021-05-05 DIAGNOSIS — Z66 Do not resuscitate: Secondary | ICD-10-CM

## 2021-05-05 DIAGNOSIS — J9621 Acute and chronic respiratory failure with hypoxia: Secondary | ICD-10-CM

## 2021-05-05 DIAGNOSIS — J9622 Acute and chronic respiratory failure with hypercapnia: Secondary | ICD-10-CM

## 2021-05-05 DIAGNOSIS — I272 Pulmonary hypertension, unspecified: Secondary | ICD-10-CM

## 2021-05-05 DIAGNOSIS — I5033 Acute on chronic diastolic (congestive) heart failure: Secondary | ICD-10-CM | POA: Diagnosis not present

## 2021-05-05 DIAGNOSIS — Z515 Encounter for palliative care: Secondary | ICD-10-CM

## 2021-05-05 LAB — CBC
HCT: 27.9 % — ABNORMAL LOW (ref 39.0–52.0)
Hemoglobin: 8.7 g/dL — ABNORMAL LOW (ref 13.0–17.0)
MCH: 28.2 pg (ref 26.0–34.0)
MCHC: 31.2 g/dL (ref 30.0–36.0)
MCV: 90.3 fL (ref 80.0–100.0)
Platelets: 291 10*3/uL (ref 150–400)
RBC: 3.09 MIL/uL — ABNORMAL LOW (ref 4.22–5.81)
RDW: 15.6 % — ABNORMAL HIGH (ref 11.5–15.5)
WBC: 7.5 10*3/uL (ref 4.0–10.5)
nRBC: 0 % (ref 0.0–0.2)

## 2021-05-05 LAB — BASIC METABOLIC PANEL
Anion gap: 8 (ref 5–15)
BUN: 69 mg/dL — ABNORMAL HIGH (ref 8–23)
CO2: 30 mmol/L (ref 22–32)
Calcium: 8 mg/dL — ABNORMAL LOW (ref 8.9–10.3)
Chloride: 100 mmol/L (ref 98–111)
Creatinine, Ser: 3.55 mg/dL — ABNORMAL HIGH (ref 0.61–1.24)
GFR, Estimated: 17 mL/min — ABNORMAL LOW (ref >60)
Glucose, Bld: 132 mg/dL — ABNORMAL HIGH (ref 70–99)
Potassium: 4.2 mmol/L (ref 3.5–5.1)
Sodium: 138 mmol/L (ref 135–145)

## 2021-05-05 LAB — GLUCOSE, CAPILLARY
Glucose-Capillary: 127 mg/dL — ABNORMAL HIGH (ref 70–99)
Glucose-Capillary: 152 mg/dL — ABNORMAL HIGH (ref 70–99)
Glucose-Capillary: 188 mg/dL — ABNORMAL HIGH (ref 70–99)
Glucose-Capillary: 211 mg/dL — ABNORMAL HIGH (ref 70–99)

## 2021-05-05 LAB — MAGNESIUM: Magnesium: 2.1 mg/dL (ref 1.7–2.4)

## 2021-05-05 LAB — PHOSPHORUS: Phosphorus: 5.4 mg/dL — ABNORMAL HIGH (ref 2.5–4.6)

## 2021-05-05 MED ORDER — HYDROCODONE-ACETAMINOPHEN 5-325 MG PO TABS
2.0000 | ORAL_TABLET | Freq: Four times a day (QID) | ORAL | Status: DC | PRN
  Administered 2021-05-06: 1 via ORAL
  Administered 2021-05-06 – 2021-05-10 (×7): 2 via ORAL
  Administered 2021-05-10 – 2021-05-11 (×3): 1 via ORAL
  Filled 2021-05-05 (×11): qty 2

## 2021-05-05 MED ORDER — ENSURE ENLIVE PO LIQD
237.0000 mL | Freq: Two times a day (BID) | ORAL | Status: DC
  Administered 2021-05-05 – 2021-05-13 (×17): 237 mL via ORAL

## 2021-05-05 MED ORDER — SODIUM CHLORIDE 0.9% FLUSH
3.0000 mL | Freq: Two times a day (BID) | INTRAVENOUS | Status: DC
  Administered 2021-05-05 – 2021-05-20 (×30): 3 mL via INTRAVENOUS

## 2021-05-05 MED ORDER — HYDRALAZINE HCL 25 MG PO TABS
25.0000 mg | ORAL_TABLET | Freq: Three times a day (TID) | ORAL | Status: DC
  Administered 2021-05-05 – 2021-05-16 (×34): 25 mg via ORAL
  Filled 2021-05-05 (×34): qty 1

## 2021-05-05 NOTE — Progress Notes (Signed)
Occupational Therapy Treatment Patient Details Name: Stephen [name] MRN: [id number] DOB: 1945-07-27 Today's Date: 05/05/2021   History of present illness CATHY ROPP is a 75 y.o. male with medical history significant for , interstitial lung disease, pulmonary hypertension, OSA with chronic respiratory failure on home O2 at 4L and Bipap, followed by pulmonology, as well as diastolic heart failure, CAD, DM, CKD IV, recently hospitalized from 10/16-10/29 with NSTEMI treated with heparin infusion, and respiratory failure secondary to CHF and COPD exacerbation who returns to the ED 2 days later with complaints of chest pain and shortness of breath. Chest pain was severe heaviness on his chest.EMS reports sats of 91% on his normal 4 L.  They attempted to increase it to 6 L with minimal improvement to 93% and and then put him on a nonrebreather and sats were 100%.  Wife contributes to history .   OT comments  Pt seen for OT co-tx with PT this date to f/u re: safety with ADL mobility and strengthening to improve INDEP/safety with ADLs/ADL mobility. Pt requires MIN/MOD A +2 to come to EOB sitting and demos G static balance, F dynamic. Pt requires MOD A +2 for initial CTS. He demos improving tolerance and is able to tolerate standing from only slightly increased EOB surface (~3-4" this date versus ~6" elevation yesterday). Pt requires MIN A +2 with RW for SPS from EOB to chair. He is noted to fatigue easily and be tremulous in lower extremities with light activity 2/2 weakness. While in chair, OT demos L UE ROM and exercises he can do to promote improved circulation, healing and edema reduction. In addition, OT engages pt in ed re: benefits of MIP while seated in chair to promote circulation and LE strengthening in prep for improved mobility and transfers. Pt left in chair with alarm set and his daughter present in room. RN updated. Will continue to follow.    Recommendations for follow up therapy are one  component of a multi-disciplinary discharge planning process, led by the attending physician.  Recommendations may be updated based on patient status, additional functional criteria and insurance authorization.    Follow Up Recommendations  Home health OT    Assistance Recommended at Discharge Frequent or constant Supervision/Assistance  Equipment Recommendations  BSC;Tub/shower seat    Recommendations for Other Services      Precautions / Restrictions Precautions Precautions: Fall Restrictions Weight Bearing Restrictions: No Other Position/Activity Restrictions: still with lingering pain in L UE from thrombophlebitis       Mobility Bed Mobility Overal bed mobility: Needs Assistance Bed Mobility: Supine to Sit     Supine to sit: Min guard;Min assist;HOB elevated;+2 for safety/equipment     General bed mobility comments: increased time, cues for hand placement/use of bed rails/trunk elevation, HOB elevated    Transfers Overall transfer level: Needs assistance Equipment used: Rolling walker (2 wheels) Transfers: Sit to/from Stand Sit to Stand: Mod assist;+2 physical assistance;+2 safety/equipment;From elevated surface Stand pivot transfers: Min assist;+2 safety/equipment         General transfer comment: increased time and ccues, use of RW, cues for hand placement (start with L hand up d/t pain), rock x3 to gain momentum. Bed surface less elevated this date, only ~3"     Balance Overall balance assessment: Needs assistance Sitting-balance support: Feet supported Sitting balance-Leahy Scale: Fair Sitting balance - Comments: uses R UE for seated support Postural control: Left lateral lean Standing balance support: Bilateral upper extremity supported;During functional activity Standing balance-Leahy Scale:  Poor Standing balance comment: patient tremoulous in standing. Legs bouncing, but no buckling with weight shifting. Patient seems anxious with mobilizing                            ADL either performed or assessed with clinical judgement   ADL Overall ADL's : Needs assistance/impaired                     Lower Body Dressing: Sitting/lateral leans;Maximal assistance Lower Body Dressing Details (indicate cue type and reason): to don socks, pt reports he requires spouse assist at baseline                     Vision Patient Visual Report: No change from baseline     Perception     Praxis      Cognition Arousal/Alertness: Awake/alert Behavior During Therapy: WFL for tasks assessed/performed;Flat affect Overall Cognitive Status: Within Functional Limits for tasks assessed                                 General Comments: not very conversive, increased processing time          Exercises Other Exercises Other Exercises: OT ed with pt and dtr re: MIP in sitting, elevating L UE himself to promote improved circulation to help with healing, flex/ext digits in grasp pattern to reduce edema in L hand while UE is elevated. Pt with moderate reception. Limited engagement in education, but does demo understanding.   Shoulder Instructions       General Comments      Pertinent Vitals/ Pain       Pain Assessment: Faces Faces Pain Scale: Hurts little more Facial Expression: Grimacing Body Movements: Protection Muscle Tension: Tense, rigid Pain Location: L hand/wrist Pain Descriptors / Indicators: Grimacing;Guarding;Discomfort Pain Intervention(s): Limited activity within patient's tolerance;Monitored during session  Home Living                                          Prior Functioning/Environment              Frequency  Min 2X/week        Progress Toward Goals  OT Goals(current goals can now be found in the care plan section)  Progress towards OT goals: Progressing toward goals  Acute Rehab OT Goals OT Goal Formulation: With patient/family Time For Goal Achievement:  05/18/21 Potential to Achieve Goals: [place] Discharge plan remains appropriate;Frequency remains appropriate    Co-evaluation    PT/OT/SLP Co-Evaluation/Treatment: Yes Reason for Co-Treatment: Complexity of the patient's impairments (multi-system involvement);For patient/therapist safety;To address functional/ADL transfers PT goals addressed during session: Mobility/safety with mobility OT goals addressed during session: ADL's and self-care      AM-PAC OT "6 Clicks" Daily Activity     Outcome Measure   Help from another person eating meals?: A Little Help from another person taking care of personal grooming?: A Little Help from another person toileting, which includes using toliet, bedpan, or urinal?: A Lot Help from another person bathing (including washing, rinsing, drying)?: A Lot Help from another person to put on and taking off regular upper body clothing?: A Lot Help from another person to put on and taking off regular lower body clothing?: A Lot 6 Click  Score: 14    End of Session Equipment Utilized During Treatment: Oxygen;Gait belt;Rolling walker (2 wheels)  OT Visit Diagnosis: Other abnormalities of gait and mobility (R26.89);Muscle weakness (generalized) (M62.81);Pain Pain - Right/Left: Left Pain - part of body: Arm   Activity Tolerance Patient limited by pain;Other (comment)   Patient Left with call bell/phone within reach;with family/visitor present;in chair;with chair alarm set   Nurse Communication Mobility status        Time: 5789-7847 OT Time Calculation (min): 18 min  Charges: OT General Charges $OT Visit: 1 Visit OT Treatments $Therapeutic Exercise: 8-22 mins  Gerrianne Scale, [place], OTR/L ascom [phone] 05/05/21, 6:37 PM

## 2021-05-05 NOTE — Progress Notes (Signed)
PROGRESS NOTE    Stephen [name]  ION:[id number] DOB: March 25, 1946 DOA: 04/28/2021 PCP: [name], MD    Brief Narrative:  This 75 year old man who is readmitted with shortness of breath and acute on chronic diastolic congestive heart failure with pulmonary hypertension. The patient's creatinine has worsened with diuresis. Patient empirically placed on heparin drip.  Cardiology and Nephrology following. He is readmitted after a long hospital course.  Patient was treated medically for NSTEMI.  Patient has acute on chronic hypoxic hypercapnic respiratory failure on BiPAP at night.  Also has pulmonary hypertension and interstitial lung disease chronically on 4 L of oxygen.  Patient has acute on chronic diastolic congestive heart failure and creatinine is very sensitive to diuretics.  Patient was also treated for UTI during the hospital course and acute metabolic encephalopathy and received 7 days of Rocephin for E. coli and Serratia in the urine. He is started on doxycycline for erythema of the left antecubital fossa to cover staph aureus from recent IV site.  Patient also had a superficial thrombophlebitis left arm which was treated with conservative management.  Assessment & Plan:   Active Problems:   Acute on chronic diastolic CHF (congestive heart failure) (HCC)   Elevated troponin   CHF (congestive heart failure) (HCC)   Unstable angina (HCC)   Pulmonary hypertension (HCC)   Acute on chronic respiratory failure with hypoxia and hypercapnia (HCC)   CKD (chronic kidney disease), stage IV (HCC)   Interstitial lung disease (HCC)   B12 deficiency   Pressure injury of skin   Acute on chronic diastolic congestive heart failure: Patient was started on Lasix IV 40 mg twice daily but due to increased serum creatinine it is reduced if 40 mg daily. Continue IV Lasix 40 mg daily.  Cardiology is following. Continue Coreg.  Monitor intake output charting. Shortness of breath has slightly  improved.  NSTEMI: Patient presented with elevated troponin with history of CAD and recent NSTEMI. Completed heparin drip for 48 hours. Continue Imdur, Coreg, Plavix, atorvastatin. Maximize medical management with chronic kidney disease. Cardiology think LHC or possible PCI will not improve patient's symptoms at this is largely due to pulmonary fibrosis. LHC may increase his chances of getting hemodialysis given worsening's AKI.  Acute on chronic hypoxic and hypercapnic respiratory failure: Patient has history of pulmonary hypertension and interstial lung disease. Continue BiPAP as needed and nightly.   Continue supplemental oxygen 4 to 6 L during daytime.  Acute kidney injury on CKD stage IV: Serum creatinine up to 3.53 with diuresis. Continue to monitor serum creatinine. Nephrology is following,  Patient is at high risk for renal replacement therapy.  Type 2 diabetes: Continue Levemir 30 units at night and regular insulin sliding scale.  Obstructive sleep apnea with morbid obesity: Continue CPAP at night.  Superficial thrombophlebitis left arm: Continue conservative management. Patient was on heparin drip for 48 hours. Continue doxycycline for few days. Erythema has significantly improved.  Vitamin B12 deficiency:  Continue oral supplementation.  Palliative care consultation: Patient overall prognosis is poor.   Difficult to balance CHF, acute kidney injury on CKD, chronic hypoxic hypercarbic respiratory failure,end-stage lung disease with pulmonary fibrosis.  Generalized weakness: PT and OT eval : Recommended home health services.   DVT prophylaxis: Heparin sq Code Status:DNR Family Communication: Son at bed side,. Disposition Plan:   Status is: Inpatient  Remains inpatient appropriate because: AKI, CHF.  Needs goals of care discussion.  May require renal replacement therapy.  Consultants:  Nephrology Cardiology  Procedures:   Antimicrobials:    Anti-infectives (From admission, onward)    Start     Dose/Rate Route Frequency Ordered Stop   04/15/2021 1000  doxycycline (VIBRA-TABS) tablet 100 mg        100 mg Oral Every 12 hours 04/30/2021 0725 05/05/21 2359        Subjective: Patient was seen and examined at bedside.  Overnight events noted.   Patient reports he is feeling better, appears significantly deconditioned. He remains on 4 L of supplemental oxygen.  Objective: Vitals:   05/05/21 0418 05/05/21 0500 05/05/21 0744 05/05/21 1125  BP: (!) 153/66  (!) 163/64 (!) 147/60  Pulse: (!) 57  (!) 57 (!) 53  Resp: 18  18 16   Temp: 97.8 F (36.6 C)  97.7 F (36.5 C) 97.9 F (36.6 C)  TempSrc:      SpO2: 94%  97% 98%  Weight:  114 kg    Height:        Intake/Output Summary (Last 24 hours) at 05/05/2021 1420 Last data filed at 05/05/2021 1230 Gross per 24 hour  Intake --  Output 1500 ml  Net -1500 ml   Filed Weights   04/03/2021 0211 05/05/21 0500  Weight: 114.3 kg 114 kg    Examination:  General exam: Appears calm and comfortable , NAD  appears deconditioned. Respiratory system: Clear to auscultation. Respiratory effort normal. RR 17 Cardiovascular system: S1-S2 heard, regular rate and rhythm, no murmur. Gastrointestinal system: Abdomen is soft, nontender, nondistended, BS+ Central nervous system: Alert and oriented x 3. No focal neurological deficits. Extremities: No edema, no cyanosis, no clubbing. Skin: No rashes, lesions or ulcers Psychiatry: Judgement and insight appear normal. Mood & affect appropriate.     Data Reviewed: I have personally reviewed following labs and imaging studies  CBC: Recent Labs  Lab 04/23/2021 0213 05/03/21 0716 05/05/21 0555  WBC 11.6* 11.2* 7.5  NEUTROABS 9.8*  --   --   HGB 9.7* 10.1* 8.7*  HCT 29.8* 32.0* 27.9*  MCV 87.6 87.9 90.3  PLT 332 302 081   Basic Metabolic Panel: Recent Labs  Lab 04/30/21 0632 04/10/2021 0213 05/03/21 0716 05/04/21 0610 05/05/21 0555  NA  134* 135 136 136 138  K 3.8 4.5 4.6 4.4 4.2  CL 100 99 99 99 100  CO2 28 28 26 31 30   GLUCOSE 150* 241* 223* 218* 132*  BUN 84* 72* 67* 72* 69*  CREATININE 3.03* 3.12* 3.53* 3.72* 3.55*  CALCIUM 8.0* 8.2* 8.0* 7.9* 8.0*  MG  --   --   --   --  2.1  PHOS  --   --   --   --  5.4*   GFR: Estimated Creatinine Clearance: 21.6 mL/min (A) (by C-G formula based on SCr of 3.55 mg/dL (H)). Liver Function Tests: No results for input(s): AST, ALT, ALKPHOS, BILITOT, PROT, ALBUMIN in the last 168 hours. No results for input(s): LIPASE, AMYLASE in the last 168 hours. No results for input(s): AMMONIA in the last 168 hours. Coagulation Profile: Recent Labs  Lab 04/24/2021 0447  INR 1.2   Cardiac Enzymes: No results for input(s): CKTOTAL, CKMB, CKMBINDEX, TROPONINI in the last 168 hours. BNP (last 3 results) No results for input(s): PROBNP in the last 8760 hours. HbA1C: No results for input(s): HGBA1C in the last 72 hours.  CBG: Recent Labs  Lab 05/04/21 1114 05/04/21 1631 05/04/21 2130 05/05/21 0745 05/05/21 1127  GLUCAP 215* 201* 198* 127* 152*   Lipid Profile: No results  for input(s): CHOL, HDL, LDLCALC, TRIG, CHOLHDL, LDLDIRECT in the last 72 hours. Thyroid Function Tests: No results for input(s): TSH, T4TOTAL, FREET4, T3FREE, THYROIDAB in the last 72 hours. Anemia Panel: No results for input(s): VITAMINB12, FOLATE, FERRITIN, TIBC, IRON, RETICCTPCT in the last 72 hours. Sepsis Labs: No results for input(s): PROCALCITON, LATICACIDVEN in the last 168 hours.  Recent Results (from the past 240 hour(s))  CULTURE, BLOOD (ROUTINE X 2) w Reflex to ID Panel     Status: None   Collection Time: 04/25/21  3:33 PM   Specimen: BLOOD  Result Value Ref Range Status   Specimen Description BLOOD BLOOD RIGHT WRIST  Final   Special Requests   Final    BOTTLES DRAWN AEROBIC AND ANAEROBIC Blood Culture adequate volume   Culture   Final    NO GROWTH 5 DAYS Performed at [facility], [address] [place], [place] [address]    Report Status 04/30/2021 FINAL  Final  CULTURE, BLOOD (ROUTINE X 2) w Reflex to ID Panel     Status: None   Collection Time: 04/25/21  3:38 PM   Specimen: BLOOD  Result Value Ref Range Status   Specimen Description BLOOD BLOOD RIGHT HAND  Final   Special Requests   Final    BOTTLES DRAWN AEROBIC AND ANAEROBIC Blood Culture adequate volume   Culture   Final    NO GROWTH 5 DAYS Performed at [facility], [address] [place], [place] [address]    Report Status 04/30/2021 FINAL  Final  Resp Panel by RT-PCR (Flu A&B, Covid)     Status: None   Collection Time: 04/04/2021 10:46 AM  Result Value Ref Range Status   SARS Coronavirus 2 by RT PCR NEGATIVE NEGATIVE Final    Comment: (NOTE) SARS-CoV-2 target nucleic acids are NOT DETECTED.  The SARS-CoV-2 RNA is generally detectable in upper respiratory specimens during the acute phase of infection. The lowest concentration of SARS-CoV-2 viral copies this assay can detect is 138 copies/mL. A negative result does not preclude SARS-Cov-2 infection and should not be used as the sole basis for treatment or other patient management decisions. A negative result may occur with  improper specimen collection/handling, submission of specimen other than nasopharyngeal swab, presence of viral mutation(s) within the areas targeted by this assay, and inadequate number of viral copies(<138 copies/mL). A negative result must be combined with clinical observations, patient history, and epidemiological information. The expected result is Negative.  Fact Sheet for Patients:  EntrepreneurPulse.com.au  Fact Sheet for Healthcare Providers:  IncredibleEmployment.be  This test is no t yet approved or cleared by the [place] FDA and  has been authorized for detection and/or diagnosis of SARS-CoV-2 by FDA under an Emergency Use Authorization (EUA). This EUA will remain   in effect (meaning this test can be used) for the duration of the COVID-19 declaration under Section 564(b)(1) of the Act, 21 U.S.C.section 360bbb-3(b)(1), unless the authorization is terminated  or revoked sooner.       Influenza A by PCR NEGATIVE NEGATIVE Final   Influenza B by PCR NEGATIVE NEGATIVE Final    Comment: (NOTE) The Xpert Xpress SARS-CoV-2/FLU/RSV plus assay is intended as an aid in the diagnosis of influenza from Nasopharyngeal swab specimens and should not be used as a sole basis for treatment. Nasal washings and aspirates are unacceptable for Xpert Xpress SARS-CoV-2/FLU/RSV testing.  Fact Sheet for Patients: EntrepreneurPulse.com.au  Fact Sheet for Healthcare Providers: IncredibleEmployment.be  This test is not yet  approved or cleared by the [place] and has been authorized for detection and/or diagnosis of SARS-CoV-2 by FDA under an Emergency Use Authorization (EUA). This EUA will remain in effect (meaning this test can be used) for the duration of the COVID-19 declaration under Section 564(b)(1) of the Act, 21 U.S.C. section 360bbb-3(b)(1), unless the authorization is terminated or revoked.  Performed at [facility], [address] [place], [place] [address]     Radiology Studies: No results found.  Scheduled Meds:  aspirin EC  81 mg Oral Daily   atorvastatin  80 mg Oral QHS   calcitRIOL  0.25 mcg Oral Daily   carvedilol  25 mg Oral BID WC   cloNIDine  0.1 mg Oral TID   clopidogrel  75 mg Oral Daily   dorzolamide-timolol  1 drop Both Eyes BID   doxycycline  100 mg Oral Q12H   enoxaparin (LOVENOX) injection  30 mg Subcutaneous Q24H   ezetimibe  10 mg Oral Daily   feeding supplement  237 mL Oral BID BM   fluticasone furoate-vilanterol  1 puff Inhalation Daily   And   umeclidinium bromide  1 puff Inhalation Daily   furosemide  40 mg Intravenous Daily   gabapentin  100 mg Oral BID   insulin  aspart  0-20 Units Subcutaneous TID WC   insulin aspart  0-5 Units Subcutaneous QHS   insulin detemir  30 Units Subcutaneous QHS   isosorbide mononitrate  120 mg Oral Daily   levothyroxine  50 mcg Oral Q0600   montelukast  10 mg Oral Daily   pantoprazole  40 mg Oral Daily   tamsulosin  0.4 mg Oral QHS   cyanocobalamin  1,000 mcg Oral Daily   Continuous Infusions:   LOS: 2 days    Time spent: 25 mins    Shawna Clamp, MD [facility]   If 7PM-7AM, please contact night-coverage

## 2021-05-05 NOTE — Progress Notes (Signed)
Palliative Care Progress Note, Assessment & Plan   Patient Name: Stephen [name]       Date: 05/05/2021 DOB: 1945-11-29  Age: 75 y.o. MRN#: [id number] Attending Physician: Shawna Clamp, MD Primary Care Physician: [name], MD Admit Date: 04/18/2021  Reason for Consultation/Follow-up: Establishing goals of care  Subjective: Pt sleeping in bed, NAD, but easily arousable and participated in discussion. Pt's son at bedside. Pt c/o pain uncontrolled by pain regimen. Pt endorses pain in left hand/wrist.   HPI: 75 y.o. male  with past medical history of multivessel coronary disease, chronic heart failure, pulmonary fibrosis, pulmonary hypertension, COPD, chronic kidney disease, diabetes, renal artery stenosis, and interstitial lung disease admitted on 04/03/2021 with acute on chronic heart failure with demand ischemia.   Palliative medicine was consulted to discuss goals of care and symptom management.  Pt c/o pain in left arm unrelieved by 1 tab PO of Norco. I advised pt he can have 1 or 2 tabs every 6 hours, but he did not remember this from our discussion yesterday.   I reviewed importance of patient communicating his accurate pain level and the importance of appropriate pain management in contributing to rest and recovery. Pt reported he would take more medicine to help him but that he just did not know he could.  Son endorsed that patient is not a big complainer and would likely not ask for additional pain medicine unless it was offered.  I spoke with bedside RN and clarify that pt should be able to receive 1 or 2 tabs of Norco. If pateitn request 1 instead of 2, and 1 tab is not sufficient in reducing his pain, pt can have 2nd tab approximately 30-45 minutes after 1st tab taken. RN confirmed  understanding.  I advised the patient that I will continue to round on him and monitor him throughout this hospitalization.  I will return tomorrow and reevaluate his symptom burden.  Code Status: DNR  Prognosis:  Unable to determine  Discharge Planning: To Be Determined  Recommendations/Plan: Continue to treat the treatable Increase pain regimen: Norco 2 tabs PO Q6H PRN   Care plan was discussed with patient, patient's son, bedside nursing  Physical Exam Constitutional:      General: He is not in acute distress.    Appearance: He is ill-appearing.  HENT:     Head: Normocephalic and atraumatic.  Cardiovascular:     Rate and Rhythm: Normal rate.  Pulmonary:     Effort: Pulmonary effort is normal.     Comments: Nasal canula Abdominal:     Palpations: Abdomen is soft.  Musculoskeletal:     Comments: Pain with palpation and movement of left arm/wrist  Skin:    General: Skin is warm and dry.  Neurological:     Mental Status: He is alert and oriented to person, place, and time.  Psychiatric:        Mood and Affect: Mood normal. Mood is not anxious.        Behavior: Behavior normal. Behavior is not agitated.               Total Time 15 minutes Prolonged Time Billed  no   Greater than  50%  of this time was spent counseling and coordinating care related to the above assessment and plan.  Thank you for allowing the Palliative Medicine Team to assist in the care of this patient.  [place] Ilsa Iha, FNP-BC Palliative Medicine Team Team Phone # [phone]

## 2021-05-05 NOTE — Progress Notes (Signed)
Physical Therapy Treatment Patient Details Name: Stephen [name] MRN: [id number] DOB: 02/17/1946 Today's Date: 05/05/2021   History of Present Illness Stephen [name] is a 75 y.o. male with medical history significant for , interstitial lung disease, pulmonary hypertension, OSA with chronic respiratory failure on home O2 at 4L and Bipap, followed by pulmonology, as well as diastolic heart failure, CAD, DM, CKD IV, recently hospitalized from 10/16-10/29 with NSTEMI treated with heparin infusion, and respiratory failure secondary to CHF and COPD exacerbation who returns to the ED 2 days later with complaints of chest pain and shortness of breath. Chest pain was severe heaviness on his chest.EMS reports sats of 91% on his normal 4 L.  They attempted to increase it to 6 L with minimal improvement to 93% and and then put him on a nonrebreather and sats were 100%.  Wife contributes to history .    PT Comments    Patient received in bed, he is agreeable to PT session. Daughter present in room. Patient requires mod assist for supine to sit. Requires min assist for sitting balance with left lateral lean. Patient is tremulous with mobility. Flat affect. He is able to stand with +2 min assist. Patient anxious with standing and ambulation. He required min +2 assist for taking a few steps to recliner from bed and cues for safe hand placement for transitioning. He will continue to benefit from skilled PT while here to improve functional independence, strength and safety with mobility.       Recommendations for follow up therapy are one component of a multi-disciplinary discharge planning process, led by the attending physician.  Recommendations may be updated based on patient status, additional functional criteria and insurance authorization.  Follow Up Recommendations  Home health PT     Assistance Recommended at Discharge Frequent or constant Supervision/Assistance  Equipment Recommendations  None  recommended by PT    Recommendations for Other Services       Precautions / Restrictions Precautions Precautions: Fall Restrictions Weight Bearing Restrictions: No Other Position/Activity Restrictions: still with lingering pain in L UE from thrombophlebitis     Mobility  Bed Mobility Overal bed mobility: Needs Assistance Bed Mobility: Supine to Sit     Supine to sit: Mod assist     General bed mobility comments: increased time, cues for hand placement/use of bed rails, HOB elevated.    Transfers Overall transfer level: Needs assistance Equipment used: Rolling walker (2 wheels) Transfers: Sit to/from Stand Sit to Stand: Mod assist;+2 physical assistance;+2 safety/equipment;From elevated surface Stand pivot transfers: Min assist;+2 safety/equipment         General transfer comment: increased time, cues for use of RW, rock to gain momentum    Ambulation/Gait Ambulation/Gait assistance: Min Web designer (Feet): 4 Feet Assistive device: Rolling walker (2 wheels) Gait Pattern/deviations: Step-to pattern;Decreased step length - right;Decreased step length - left;Trunk flexed Gait velocity: decreased   General Gait Details: patient able to take a few steps from bed to recliner with min assist +2 for safety. O2 sats after getting to recliner at 87%, cues given for pursed lip breathing   Stairs             Wheelchair Mobility    Modified Rankin (Stroke Patients Only)       Balance Overall balance assessment: Needs assistance Sitting-balance support: Feet supported Sitting balance-Leahy Scale: Fair Sitting balance - Comments: uses R UE for seated support Postural control: Left lateral lean Standing balance support: Bilateral upper extremity supported;During  functional activity Standing balance-Leahy Scale: Poor Standing balance comment: patient tremoulous in standing. Legs bouncing, but no buckling with weight shifting. Patient seems anxious during  activity                            Cognition Arousal/Alertness: Awake/alert Behavior During Therapy: WFL for tasks assessed/performed;Flat affect Overall Cognitive Status: Within Functional Limits for tasks assessed                                 General Comments: not very conversive, increased processing time        Exercises      General Comments        Pertinent Vitals/Pain Pain Assessment: Faces Faces Pain Scale: Hurts little more Facial Expression: Grimacing Body Movements: Protection Muscle Tension: Tense, rigid Pain Location: L hand/wrist Pain Descriptors / Indicators: Grimacing;Guarding;Discomfort Pain Intervention(s): Monitored during session;Limited activity within patient's tolerance;Repositioned    Home Living                          Prior Function            PT Goals (current goals can now be found in the care plan section) Acute Rehab PT Goals Patient Stated Goal: go home PT Goal Formulation: With patient/family Time For Goal Achievement: 05/18/21 Potential to Achieve Goals: Fair Progress towards PT goals: Progressing toward goals    Frequency    Min 2X/week      PT Plan Current plan remains appropriate    Co-evaluation PT/OT/SLP Co-Evaluation/Treatment: Yes Reason for Co-Treatment: For patient/therapist safety;To address functional/ADL transfers PT goals addressed during session: Mobility/safety with mobility        AM-PAC PT "6 Clicks" Mobility   Outcome Measure  Help needed turning from your back to your side while in a flat bed without using bedrails?: A Lot Help needed moving from lying on your back to sitting on the side of a flat bed without using bedrails?: A Lot Help needed moving to and from a bed to a chair (including a wheelchair)?: A Lot Help needed standing up from a chair using your arms (e.g., wheelchair or bedside chair)?: A Lot Help needed to walk in hospital room?: A Lot Help  needed climbing 3-5 steps with a railing? : Total 6 Click Score: 11    End of Session Equipment Utilized During Treatment: Gait belt;Oxygen Activity Tolerance: Patient limited by fatigue Patient left: in chair;with call bell/phone within reach;with chair alarm set;with family/visitor present Nurse Communication: Mobility status PT Visit Diagnosis: Other abnormalities of gait and mobility (R26.89);Muscle weakness (generalized) (M62.81);Difficulty in walking, not elsewhere classified (R26.2)     Time: 0786-7544 PT Time Calculation (min) (ACUTE ONLY): 18 min  Charges:  $Therapeutic Activity: 8-22 mins                     [employer], PT, GCS 05/05/21,4:03 PM

## 2021-05-05 NOTE — Progress Notes (Signed)
[facility], [place] 05/05/21  Subjective:   LOS: 2 11/02 0701 - 11/03 0700 In: 240 [P.O.:240] Out: 1884 [[id number]] Patient known to our practice from outpatient follow-up.  He is followed by Dr. Holley Raring and was last seen on August 9. He has medical problems of longstanding diabetes, hypertension, history of nephrolithiasis, hyperlipidemia, coronary artery disease status post PTCA, pulmonary fibrosis on 4 L chronic oxygen, obstructive sleep apnea, history of right adrenalectomy, left renal artery stenosis status post stent, pulmonary hypertension  Patient seen sitting up in Son at bedside assisting with breakfast Denies shortness of breath, remains on 4L supplemental oxygen Appetite remains poor  Creatinine slightly improved to 3.55 (3.72)  Objective:  Vital signs in last 24 hours:  Temp:  [97.7 F (36.5 C)-97.9 F (36.6 C)] 97.7 F (36.5 C) (11/03 0744) Pulse Rate:  [54-57] 57 (11/03 0744) Resp:  [14-20] 18 (11/03 0744) BP: (152-165)/(57-66) 163/64 (11/03 0744) SpO2:  [93 %-100 %] 97 % (11/03 0744) Weight:  [010 kg] 114 kg (11/03 0500)  Weight change:  Filed Weights   04/19/2021 0211 05/05/21 0500  Weight: 114.3 kg 114 kg    Intake/Output:    Intake/Output Summary (Last 24 hours) at 05/05/2021 1013 Last data filed at 05/05/2021 0530 Gross per 24 hour  Intake 240 ml  Output 1100 ml  Net -860 ml      Physical Exam: General: No acute distress, laying in the bed  HEENT Moist oral mucous membranes  Pulm/lungs Bibasilar crackles, 4 L O2 [place]  CVS/Heart Holosystolic murmur, no rub  Abdomen:  Soft, obese, nontender  Extremities: 2+ pitting edema bilaterally  Neurologic: Alert, oriented  Skin: Warm, dry          Basic Metabolic Panel:  Recent Labs  Lab 04/30/21 0632 04/07/2021 0213 05/03/21 0716 05/04/21 0610 05/05/21 0555  NA 134* 135 136 136 138  K 3.8 4.5 4.6 4.4 4.2  CL 100 99 99 99 100  CO2 28 28 26 31 30   GLUCOSE 150* 241*  223* 218* 132*  BUN 84* 72* 67* 72* 69*  CREATININE 3.03* 3.12* 3.53* 3.72* 3.55*  CALCIUM 8.0* 8.2* 8.0* 7.9* 8.0*  MG  --   --   --   --  2.1  PHOS  --   --   --   --  5.4*      CBC: Recent Labs  Lab 04/06/2021 0213 05/03/21 0716 05/05/21 0555  WBC 11.6* 11.2* 7.5  NEUTROABS 9.8*  --   --   HGB 9.7* 10.1* 8.7*  HCT 29.8* 32.0* 27.9*  MCV 87.6 87.9 90.3  PLT 332 302 291      No results found for: HEPBSAG, HEPBSAB, HEPBIGM    Microbiology:  Recent Results (from the past 240 hour(s))  CULTURE, BLOOD (ROUTINE X 2) w Reflex to ID Panel     Status: None   Collection Time: 04/25/21  3:33 PM   Specimen: BLOOD  Result Value Ref Range Status   Specimen Description BLOOD BLOOD RIGHT WRIST  Final   Special Requests   Final    BOTTLES DRAWN AEROBIC AND ANAEROBIC Blood Culture adequate volume   Culture   Final    NO GROWTH 5 DAYS Performed at [facility], [address] [place], [place] [address]    Report Status 04/30/2021 FINAL  Final  CULTURE, BLOOD (ROUTINE X 2) w Reflex to ID Panel     Status: None   Collection Time: 04/25/21  3:38 PM  Specimen: BLOOD  Result Value Ref Range Status   Specimen Description BLOOD BLOOD RIGHT HAND  Final   Special Requests   Final    BOTTLES DRAWN AEROBIC AND ANAEROBIC Blood Culture adequate volume   Culture   Final    NO GROWTH 5 DAYS Performed at [facility], [address] [place], [place] [address]    Report Status 04/30/2021 FINAL  Final  Resp Panel by RT-PCR (Flu A&B, Covid)     Status: None   Collection Time: 04/10/2021 10:46 AM  Result Value Ref Range Status   SARS Coronavirus 2 by RT PCR NEGATIVE NEGATIVE Final    Comment: (NOTE) SARS-CoV-2 target nucleic acids are NOT DETECTED.  The SARS-CoV-2 RNA is generally detectable in upper respiratory specimens during the acute phase of infection. The lowest concentration of SARS-CoV-2 viral copies this assay can detect is 138 copies/mL. A negative result  does not preclude SARS-Cov-2 infection and should not be used as the sole basis for treatment or other patient management decisions. A negative result may occur with  improper specimen collection/handling, submission of specimen other than nasopharyngeal swab, presence of viral mutation(s) within the areas targeted by this assay, and inadequate number of viral copies(<138 copies/mL). A negative result must be combined with clinical observations, patient history, and epidemiological information. The expected result is Negative.  Fact Sheet for Patients:  EntrepreneurPulse.com.au  Fact Sheet for Healthcare Providers:  IncredibleEmployment.be  This test is no t yet approved or cleared by the [place] FDA and  has been authorized for detection and/or diagnosis of SARS-CoV-2 by FDA under an Emergency Use Authorization (EUA). This EUA will remain  in effect (meaning this test can be used) for the duration of the COVID-19 declaration under Section 564(b)(1) of the Act, 21 U.S.C.section 360bbb-3(b)(1), unless the authorization is terminated  or revoked sooner.       Influenza A by PCR NEGATIVE NEGATIVE Final   Influenza B by PCR NEGATIVE NEGATIVE Final    Comment: (NOTE) The Xpert Xpress SARS-CoV-2/FLU/RSV plus assay is intended as an aid in the diagnosis of influenza from Nasopharyngeal swab specimens and should not be used as a sole basis for treatment. Nasal washings and aspirates are unacceptable for Xpert Xpress SARS-CoV-2/FLU/RSV testing.  Fact Sheet for Patients: EntrepreneurPulse.com.au  Fact Sheet for Healthcare Providers: IncredibleEmployment.be  This test is not yet approved or cleared by the [place] FDA and has been authorized for detection and/or diagnosis of SARS-CoV-2 by FDA under an Emergency Use Authorization (EUA). This EUA will remain in effect (meaning this test can be used) for  the duration of the COVID-19 declaration under Section 564(b)(1) of the Act, 21 U.S.C. section 360bbb-3(b)(1), unless the authorization is terminated or revoked.  Performed at [facility], [address] [place], [place] [address]     Coagulation Studies: No results for input(s): LABPROT, INR in the last 72 hours.   Urinalysis: No results for input(s): COLORURINE, LABSPEC, PHURINE, GLUCOSEU, HGBUR, BILIRUBINUR, KETONESUR, PROTEINUR, UROBILINOGEN, NITRITE, LEUKOCYTESUR in the last 72 hours.  Invalid input(s): APPERANCEUR     Imaging: No results found.   Medications:      aspirin EC  81 mg Oral Daily   atorvastatin  80 mg Oral QHS   calcitRIOL  0.25 mcg Oral Daily   carvedilol  25 mg Oral BID WC   cloNIDine  0.1 mg Oral TID   clopidogrel  75 mg Oral Daily   dorzolamide-timolol  1 drop Both Eyes BID  doxycycline  100 mg Oral Q12H   enoxaparin (LOVENOX) injection  30 mg Subcutaneous Q24H   ezetimibe  10 mg Oral Daily   fluticasone furoate-vilanterol  1 puff Inhalation Daily   And   umeclidinium bromide  1 puff Inhalation Daily   furosemide  40 mg Intravenous Daily   gabapentin  100 mg Oral BID   insulin aspart  0-20 Units Subcutaneous TID WC   insulin aspart  0-5 Units Subcutaneous QHS   insulin detemir  30 Units Subcutaneous QHS   isosorbide mononitrate  120 mg Oral Daily   levothyroxine  50 mcg Oral Q0600   montelukast  10 mg Oral Daily   pantoprazole  40 mg Oral Daily   tamsulosin  0.4 mg Oral QHS   cyanocobalamin  1,000 mcg Oral Daily   acetaminophen, albuterol, HYDROcodone-acetaminophen, nitroGLYCERIN, ondansetron (ZOFRAN) IV, polyethylene glycol  Assessment/ Plan:  75 y.o. male with medical problems of longstanding diabetes, hypertension, history of nephrolithiasis, hyperlipidemia, coronary artery disease status post PTCA, pulmonary fibrosis on 4 L chronic oxygen, obstructive sleep apnea, history of right adrenalectomy, left renal artery  stenosis status post stent, pulmonary hypertension   Active Problems:   Acute on chronic diastolic CHF (congestive heart failure) (HCC)   Elevated troponin   CHF (congestive heart failure) (HCC)   Unstable angina (HCC)   Pulmonary hypertension (HCC)   Acute on chronic respiratory failure with hypoxia and hypercapnia (HCC)   CKD (chronic kidney disease), stage IV (HCC)   Interstitial lung disease ([place])   B12 deficiency   Pressure injury of skin   #. CKD st 4 Recent Labs    05/01/2021 0213 05/03/21 0716 05/04/21 0610 05/05/21 0555  CREATININE 3.12* 3.53* 3.72* 3.55*   Patient has underlying CKD stage IV.  CKD risk factors include diabetes type 2, hypertension, history of IV contrast exposure, atherosclerosis and age Serum creatinine has been fluctuating, Baseline appears to be 2.66/GFR 25 from April 17, 2021 Renal function slightly improved today. Recorded urine output of 1.6L in proceeding 24 hours. Will continue to monitor renal function. Will prescribe Ensure twice daily until appetite improves.   #. Anemia of CKD  Lab Results  Component Value Date   HGB 8.7 (L) 05/05/2021   Hemoglobin below target, will continue to monitor  #. SHPTH  No results found for: PTH Lab Results  Component Value Date   PHOS 5.4 (H) 05/05/2021   Outpatient PTH 182 from February 08, 2021 Calcium improved but below target at 8.0 Continue on oral calcitriol  #. HTN with CKD and lower extremity edema Blood pressure within acceptable range for this patient  IV furosemide 40 mg daily Monitoring closely  #. Diabetes type 2 with CKD Hgb A1c MFr Bld (%)  Date Value  04/04/2021 7.5 (H)  Management as per primary team Glucose within acceptable range this am  #Pulmonary hypertension Right heart catheterization December 06, 2020 Pulmonary artery pressure of 44 mm, right ventricular pressure of 48 mm 2D echo from April 18, 2021.  LVEF 60 to 05%, grade 1 diastolic dysfunction, elevated left atrial  pressure, moderate thickening of aortic valve, mild calcification of mitral valve.  Cardiology following and will assess the need for additional testing or procedures     LOS: 2 Colon Flattery 11/3/202210:13 [place], [place]

## 2021-05-05 NOTE — Progress Notes (Signed)
Progress Note  Patient Name: Stephen [name] Date of Encounter: 05/05/2021  [facility] Cardiologist: Ida Rogue, MD   Subjective   Son at the bedside, helping to feed him Very weak, unable to feed himself Notes from PT reviewed, two-person assist to sit up and move Denies chest pain, reports breathing is stable Wife at home with COVID  Inpatient Medications    Scheduled Meds:  aspirin EC  81 mg Oral Daily   atorvastatin  80 mg Oral QHS   calcitRIOL  0.25 mcg Oral Daily   carvedilol  25 mg Oral BID WC   cloNIDine  0.1 mg Oral TID   clopidogrel  75 mg Oral Daily   dorzolamide-timolol  1 drop Both Eyes BID   doxycycline  100 mg Oral Q12H   enoxaparin (LOVENOX) injection  30 mg Subcutaneous Q24H   ezetimibe  10 mg Oral Daily   feeding supplement  237 mL Oral BID BM   fluticasone furoate-vilanterol  1 puff Inhalation Daily   And   umeclidinium bromide  1 puff Inhalation Daily   furosemide  40 mg Intravenous Daily   gabapentin  100 mg Oral BID   insulin aspart  0-20 Units Subcutaneous TID WC   insulin aspart  0-5 Units Subcutaneous QHS   insulin detemir  30 Units Subcutaneous QHS   isosorbide mononitrate  120 mg Oral Daily   levothyroxine  50 mcg Oral Q0600   montelukast  10 mg Oral Daily   pantoprazole  40 mg Oral Daily   tamsulosin  0.4 mg Oral QHS   cyanocobalamin  1,000 mcg Oral Daily   Continuous Infusions:   PRN Meds: acetaminophen, albuterol, HYDROcodone-acetaminophen, nitroGLYCERIN, ondansetron (ZOFRAN) IV, polyethylene glycol   Vital Signs    Vitals:   05/05/21 0744 05/05/21 1125 05/05/21 1534 05/05/21 1554  BP: (!) 163/64 (!) 147/60 (!) 171/67   Pulse: (!) 57 (!) 53 (!) 55 64  Resp: 18 16 18    Temp: 97.7 F (36.5 C) 97.9 F (36.6 C) 98.1 F (36.7 C)   TempSrc:      SpO2: 97% 98% 97% 92%  Weight:      Height:        Intake/Output Summary (Last 24 hours) at 05/05/2021 1736 Last data filed at 05/05/2021 1230 Gross per 24 hour  Intake --   Output 1500 ml  Net -1500 ml   Last 3 Weights 05/05/2021 04/12/2021 04/30/2021  Weight (lbs) 251 lb 5.2 oz 252 lb 270 lb 8.1 oz  Weight (kg) 114 kg 114.306 kg 122.7 kg  Some encounter information is confidential and restricted. Go to Review Flowsheets activity to see all data.      Telemetry    Normal sinus rhythm- Personally Reviewed  ECG    - Personally Reviewed  Physical Exam   Constitutional:  oriented to person, place, and time. No distress.  Supine in bed, nasal cannula oxygen in place HENT:  Head: Grossly normal Eyes:  no discharge. No scleral icterus.  Neck: No JVD, no carotid bruits  Cardiovascular: Regular rate and rhythm, no murmurs appreciated Pulmonary/Chest: Clear to auscultation bilaterally, no wheezes or rails Abdominal: Soft.  no distension.  no tenderness.  Musculoskeletal: Normal range of motion Neurological:  normal muscle tone. Coordination normal. No atrophy Skin: Skin warm and dry Psychiatric: normal affect, pleasant   Labs    High Sensitivity Troponin:   Recent Labs  Lab 04/18/21 0505 04/18/21 1740 04/18/21 1240 04/27/2021 0213 04/23/2021 0344  TROPONINIHS 2,129* 1,997*  5,498* 1,417* 1,514*     Chemistry Recent Labs  Lab 05/03/21 0716 05/04/21 0610 05/05/21 0555  NA 136 136 138  K 4.6 4.4 4.2  CL 99 99 100  CO2 26 31 30   GLUCOSE 223* 218* 132*  BUN 67* 72* 69*  CREATININE 3.53* 3.72* 3.55*  CALCIUM 8.0* 7.9* 8.0*  MG  --   --  2.1  GFRNONAA 17* 16* 17*  ANIONGAP 11 6 8     Lipids No results for input(s): CHOL, TRIG, HDL, LABVLDL, LDLCALC, CHOLHDL in the last 168 hours.  Hematology Recent Labs  Lab 04/29/2021 0213 05/03/21 0716 05/05/21 0555  WBC 11.6* 11.2* 7.5  RBC 3.40* 3.64* 3.09*  HGB 9.7* 10.1* 8.7*  HCT 29.8* 32.0* 27.9*  MCV 87.6 87.9 90.3  MCH 28.5 27.7 28.2  MCHC 32.6 31.6 31.2  RDW 15.9* 15.7* 15.6*  PLT 332 302 291   Thyroid  No results for input(s): TSH, FREET4 in the last 168 hours.   BNP Recent Labs   Lab 04/18/2021 0214  BNP 1,205.9*    DDimer No results for input(s): DDIMER in the last 168 hours.   Radiology    No results found.   Patient Profile  75 y.o. male with a hx of  pulmonary hypertension, chronic diastolic CHF, CAD, pulmonary fibrosis s/p wedge resection, obesity, COPD on home oxygen, OSA on CPAP, DM2, CKD, renal artery stenosis, and who is being seen today for the evaluation of chest pain and elevated HS Tn.   1.  Acute on chronic HFpEF/pulmonary arterial hypertension:  Treated aggressively this admission with IV Lasix, on and off depending on his encephalopathy -Remains on Lasix 40 IV daily, renal function relatively stable, mentating well   2.  CAD/non-STEMI:  Known severe disease Admitted with chest pain and dyspnea.  Has a history of chronic chest pain  Peak high-sensitivity troponin of 2129.   Cath in February with moderate LAD and RCA disease and occluded left circumflex with right to left and left to left collaterals.  poor candidate for catheterization given renal dysfunction,, mild encephalopathy, debilitated state Continue medical management at this time,aspirin, Plavix, beta-blocker, statin, and nitrate.    3.  Essential hypertension:  Long history for controlled hypertension likely contributing to underlying renal dysfunction Continue current doses of clonidine,  isosorbide, Coreg Blood pressure elevated, would restart hydralazine   4.  Hyperlipidemia:  LDL of 69 in June 2022.  On statin.  5.  Stage IV chronic kidney disease:  Long history of poorly controlled hypertension Nephrology following   6.  Interstitial lung disease/pulmonary fibrosis:  Requires BiPAP when napping and in the evening Develops encephalopathy when he does not wear his BiPAP Outlook is poor, We have encouraged participation with palliative care  7.  Obstructive sleep apnea:  Uses CPAP/BiPAP at night.   Total encounter time more than 35 minutes  Greater than 50% was  spent in counseling and coordination of care with the patient   For questions or updates, please contact Leslie Please consult [email or web address] for contact info under        Signed, Ida Rogue, MD  05/05/2021, 5:36 PM

## 2021-05-06 DIAGNOSIS — N184 Chronic kidney disease, stage 4 (severe): Secondary | ICD-10-CM | POA: Diagnosis not present

## 2021-05-06 DIAGNOSIS — I5033 Acute on chronic diastolic (congestive) heart failure: Secondary | ICD-10-CM | POA: Diagnosis not present

## 2021-05-06 DIAGNOSIS — R0789 Other chest pain: Secondary | ICD-10-CM | POA: Diagnosis not present

## 2021-05-06 DIAGNOSIS — J9622 Acute and chronic respiratory failure with hypercapnia: Secondary | ICD-10-CM | POA: Diagnosis not present

## 2021-05-06 DIAGNOSIS — J9621 Acute and chronic respiratory failure with hypoxia: Secondary | ICD-10-CM | POA: Diagnosis not present

## 2021-05-06 DIAGNOSIS — I25118 Atherosclerotic heart disease of native coronary artery with other forms of angina pectoris: Secondary | ICD-10-CM

## 2021-05-06 DIAGNOSIS — J849 Interstitial pulmonary disease, unspecified: Secondary | ICD-10-CM | POA: Diagnosis not present

## 2021-05-06 DIAGNOSIS — I214 Non-ST elevation (NSTEMI) myocardial infarction: Secondary | ICD-10-CM | POA: Diagnosis not present

## 2021-05-06 LAB — BASIC METABOLIC PANEL
Anion gap: 8 (ref 5–15)
BUN: 70 mg/dL — ABNORMAL HIGH (ref 8–23)
CO2: 29 mmol/L (ref 22–32)
Calcium: 8.2 mg/dL — ABNORMAL LOW (ref 8.9–10.3)
Chloride: 101 mmol/L (ref 98–111)
Creatinine, Ser: 3.65 mg/dL — ABNORMAL HIGH (ref 0.61–1.24)
GFR, Estimated: 17 mL/min — ABNORMAL LOW (ref >60)
Glucose, Bld: 193 mg/dL — ABNORMAL HIGH (ref 70–99)
Potassium: 4.6 mmol/L (ref 3.5–5.1)
Sodium: 138 mmol/L (ref 135–145)

## 2021-05-06 LAB — HEMOGLOBIN AND HEMATOCRIT, BLOOD
HCT: 29.7 % — ABNORMAL LOW (ref 39.0–52.0)
Hemoglobin: 9.2 g/dL — ABNORMAL LOW (ref 13.0–17.0)

## 2021-05-06 LAB — GLUCOSE, CAPILLARY
Glucose-Capillary: 172 mg/dL — ABNORMAL HIGH (ref 70–99)
Glucose-Capillary: 188 mg/dL — ABNORMAL HIGH (ref 70–99)
Glucose-Capillary: 273 mg/dL — ABNORMAL HIGH (ref 70–99)
Glucose-Capillary: 239 mg/dL — ABNORMAL HIGH (ref 70–99)

## 2021-05-06 MED ORDER — FUROSEMIDE 10 MG/ML IJ SOLN
8.0000 mg/h | INTRAMUSCULAR | Status: DC
  Administered 2021-05-06: 4 mg/h via INTRAVENOUS
  Administered 2021-05-10 – 2021-05-11 (×2): 8 mg/h via INTRAVENOUS
  Filled 2021-05-06 (×7): qty 20

## 2021-05-06 NOTE — Progress Notes (Signed)
Palliative Care Progress Note, Assessment & Plan   Patient Name: Stephen [name]       Date: 05/06/2021 DOB: [birth year]  Age: 75 y.o. MRN#: [id number] Attending Physician: Shawna Clamp, MD Primary Care Physician: [name], MD Admit Date: 04/22/2021  Reason for Consultation/Follow-up: Establishing goals of care  Subjective: Patient is sitting up in bed.  He is drowsy and dozes off frequently during our conversation.  HPI: 75 y.o. male  with past medical history of multivessel coronary disease, chronic heart failure, pulmonary fibrosis, pulmonary hypertension, COPD, chronic kidney disease, diabetes, renal artery stenosis, and interstitial lung disease admitted on 04/05/2021 with acute on chronic heart failure with demand ischemia.   Palliative medicine was consulted to discuss goals of care and symptom management.  I met with patient, his daughter, and Dr. Rockey Situ of cardiology at bedside. Patient's wife not presents as she is at home with Covid.  Dr. Rockey Situ outlined the multiple comorbidities contributing to the patient's current medical state.  Dr. Rockey Situ shared his concerns about the patient continuing to decompensate.  He reviewed the patient's discharge options as far as skilled nursing facilities, rehabilitation, or home.    I shared that the patient has the option to go home and be kept comfortable.  The patient dozed off several times during our discussion.  When I asked what he thought about what we were discussing he said he did not know.  He shared that he felt like he was between the devil and the deep blue C.  He says he wants to get better.    Both Dr. Rockey Situ and I shared our great worry that he will likely not get better and likely continue to decompensate.  His interstitial lung  disease is a progressive and terminal illness that we are not able to cure.  Dr. Rockey Situ shared that he the medical team is trying to find a balance between pulling off enough fluid to keep his swelling and work of breathing low but also to not pull off so much that he is dry and his blood pressure gets bottomed out.  The patient nodded in understanding.  The patient's daughter suggested that we give him more time to get better because he had a great day yesterday.  Dr. Rockey Situ outlined that he will have good and bad days.  But that overall his prognosis is poor.  Additionally, I shared that I wish we were not in the state and that we had better news to report on his health.  However, there are some large and tough decisions that need to be made regarding what the patient wants.  I reiterated that the medical team including myself make recommendations.  We would like to review these recommendations in light of his goals of care and ensure that the plan moving forward is in line with the patient's goals.  The daughter shared that she would like to discuss all of this with her mother/the patient's wife.  I allowed to time and space for therapeutic silence.  I assured the patient and his daughter that palliative medicine is available to speak with the patient's wife should she have any concerns or questions.     Code  Status: DNR  Prognosis:  Unable to determine  Discharge Planning: To Be Determined  Recommendations/Plan: Goals need to be made clear - patient unwilling to state what he wants Allow time for patient and family to discuss options moving forward  Care plan was discussed with Dr. Rockey Situ, PA Dunn, patient, patient's daughter Olivia Mackie  Physical Exam Vitals and nursing note reviewed.  Constitutional:      General: He is not in acute distress.    Appearance: He is ill-appearing.  HENT:     Head: Normocephalic and atraumatic.  Cardiovascular:     Rate and Rhythm: Normal rate.     Heart  sounds: Normal heart sounds.  Pulmonary:     Effort: Pulmonary effort is normal.  Musculoskeletal:     Comments: Generalized weakness  Skin:    General: Skin is warm and dry.  Neurological:     Mental Status: He is alert and oriented to person, place, and time.     Comments: drowsy  Psychiatric:     Comments: Appears tearful, sad, and depressed               Total Time 35 minutes Prolonged Time Billed  no   Greater than 50%  of this time was spent counseling and coordinating care related to the above assessment and plan.  Thank you for allowing the Palliative Medicine Team to assist in the care of this patient.  Rose Ilsa Iha, FNP-BC Palliative Medicine Team Team Phone # [phone]

## 2021-05-06 NOTE — Care Management Important Message (Signed)
Important Message  Patient Details  Name: [name] MRN: [id number] Date of Birth: 1945-08-04   Medicare Important Message Given:  Yes     Dannette Barbara 05/06/2021, 1:22 PM

## 2021-05-06 NOTE — Progress Notes (Addendum)
[facility] [place], [place] 05/06/21  Subjective:   LOS: 3 11/03 0701 - 11/04 0700 In: -  Out: 73 [Urine:770] Patient known to our practice from outpatient follow-up.  He is followed by Dr. Holley Raring and was last seen on August 9. He has medical problems of longstanding diabetes, hypertension, history of nephrolithiasis, hyperlipidemia, coronary artery disease status post PTCA, pulmonary fibrosis on 4 L chronic oxygen, obstructive sleep apnea, history of right adrenalectomy, left renal artery stenosis status post stent, pulmonary hypertension  Patient appears to be resting comfortably, daughter at bedside States patient has shortness of breath this a.m. and received breathing treatment.  Appetite remains poor, drinking Ensure between meals.  Patient seen later in the morning on BiPAP.  Per nursing, patient continued to complain of shortness of breath and was placed on for short period.  Objective:  Vital signs in last 24 hours:  Temp:  [98.1 F (36.7 C)-99.2 F (37.3 C)] 98.4 F (36.9 C) (11/04 1155) Pulse Rate:  [51-64] 51 (11/04 1155) Resp:  [16-21] 17 (11/04 1155) BP: (125-174)/(57-67) 125/57 (11/04 1155) SpO2:  [91 %-100 %] 95 % (11/04 1155) Weight:  [096 kg] 116 kg (11/04 0426)  Weight change: 2 kg Filed Weights   04/09/2021 0211 05/05/21 0500 05/06/21 0426  Weight: 114.3 kg 114 kg 116 kg    Intake/Output:    Intake/Output Summary (Last 24 hours) at 05/06/2021 1227 Last data filed at 05/06/2021 1044 Gross per 24 hour  Intake 120 ml  Output 1170 ml  Net -1050 ml      Physical Exam: General: No acute distress, laying in the bed  HEENT Moist oral mucous membranes  Pulm/lungs Bibasilar crackles, 4 L O2 [place]  CVS/Heart Holosystolic murmur, no rub  Abdomen:  Soft, obese, nontender  Extremities: 2+ pitting edema bilaterally  Neurologic: Alert, oriented  Skin: Warm, dry          Basic Metabolic Panel:  Recent Labs  Lab 04/08/2021 0213  05/03/21 0716 05/04/21 0610 05/05/21 0555 05/06/21 0530  NA 135 136 136 138 138  K 4.5 4.6 4.4 4.2 4.6  CL 99 99 99 100 101  CO2 28 26 31 30 29   GLUCOSE 241* 223* 218* 132* 193*  BUN 72* 67* 72* 69* 70*  CREATININE 3.12* 3.53* 3.72* 3.55* 3.65*  CALCIUM 8.2* 8.0* 7.9* 8.0* 8.2*  MG  --   --   --  2.1  --   PHOS  --   --   --  5.4*  --       CBC: Recent Labs  Lab 04/30/2021 0213 05/03/21 0716 05/05/21 0555 05/06/21 0530  WBC 11.6* 11.2* 7.5  --   NEUTROABS 9.8*  --   --   --   HGB 9.7* 10.1* 8.7* 9.2*  HCT 29.8* 32.0* 27.9* 29.7*  MCV 87.6 87.9 90.3  --   PLT 332 302 291  --       No results found for: HEPBSAG, HEPBSAB, HEPBIGM    Microbiology:  Recent Results (from the past 240 hour(s))  Resp Panel by RT-PCR (Flu A&B, Covid)     Status: None   Collection Time: 04/27/2021 10:46 AM  Result Value Ref Range Status   SARS Coronavirus 2 by RT PCR NEGATIVE NEGATIVE Final    Comment: (NOTE) SARS-CoV-2 target nucleic acids are NOT DETECTED.  The SARS-CoV-2 RNA is generally detectable in upper respiratory specimens during the acute phase of infection. The lowest concentration of SARS-CoV-2 viral copies this assay can  detect is 138 copies/mL. A negative result does not preclude SARS-Cov-2 infection and should not be used as the sole basis for treatment or other patient management decisions. A negative result may occur with  improper specimen collection/handling, submission of specimen other than nasopharyngeal swab, presence of viral mutation(s) within the areas targeted by this assay, and inadequate number of viral copies(<138 copies/mL). A negative result must be combined with clinical observations, patient history, and epidemiological information. The expected result is Negative.  Fact Sheet for Patients:  EntrepreneurPulse.com.au  Fact Sheet for Healthcare Providers:  IncredibleEmployment.be  This test is no t yet approved or  cleared by the [place] FDA and  has been authorized for detection and/or diagnosis of SARS-CoV-2 by FDA under an Emergency Use Authorization (EUA). This EUA will remain  in effect (meaning this test can be used) for the duration of the COVID-19 declaration under Section 564(b)(1) of the Act, 21 U.S.C.section 360bbb-3(b)(1), unless the authorization is terminated  or revoked sooner.       Influenza A by PCR NEGATIVE NEGATIVE Final   Influenza B by PCR NEGATIVE NEGATIVE Final    Comment: (NOTE) The Xpert Xpress SARS-CoV-2/FLU/RSV plus assay is intended as an aid in the diagnosis of influenza from Nasopharyngeal swab specimens and should not be used as a sole basis for treatment. Nasal washings and aspirates are unacceptable for Xpert Xpress SARS-CoV-2/FLU/RSV testing.  Fact Sheet for Patients: EntrepreneurPulse.com.au  Fact Sheet for Healthcare Providers: IncredibleEmployment.be  This test is not yet approved or cleared by the [place] FDA and has been authorized for detection and/or diagnosis of SARS-CoV-2 by FDA under an Emergency Use Authorization (EUA). This EUA will remain in effect (meaning this test can be used) for the duration of the COVID-19 declaration under Section 564(b)(1) of the Act, 21 U.S.C. section 360bbb-3(b)(1), unless the authorization is terminated or revoked.  Performed at [facility], [address] [place], [place] [address]     Coagulation Studies: No results for input(s): LABPROT, INR in the last 72 hours.   Urinalysis: No results for input(s): COLORURINE, LABSPEC, PHURINE, GLUCOSEU, HGBUR, BILIRUBINUR, KETONESUR, PROTEINUR, UROBILINOGEN, NITRITE, LEUKOCYTESUR in the last 72 hours.  Invalid input(s): APPERANCEUR     Imaging: No results found.   Medications:    furosemide (LASIX) 200 mg in dextrose 5% 100 mL (2mg /mL) infusion       aspirin EC  81 mg Oral Daily   atorvastatin   80 mg Oral QHS   calcitRIOL  0.25 mcg Oral Daily   carvedilol  25 mg Oral BID WC   cloNIDine  0.1 mg Oral TID   clopidogrel  75 mg Oral Daily   dorzolamide-timolol  1 drop Both Eyes BID   enoxaparin (LOVENOX) injection  30 mg Subcutaneous Q24H   ezetimibe  10 mg Oral Daily   feeding supplement  237 mL Oral BID BM   fluticasone furoate-vilanterol  1 puff Inhalation Daily   And   umeclidinium bromide  1 puff Inhalation Daily   gabapentin  100 mg Oral BID   hydrALAZINE  25 mg Oral TID   insulin aspart  0-20 Units Subcutaneous TID WC   insulin aspart  0-5 Units Subcutaneous QHS   insulin detemir  30 Units Subcutaneous QHS   isosorbide mononitrate  120 mg Oral Daily   levothyroxine  50 mcg Oral Q0600   montelukast  10 mg Oral Daily   pantoprazole  40 mg Oral Daily   sodium chloride flush  3 mL  Intravenous Q12H   tamsulosin  0.4 mg Oral QHS   cyanocobalamin  1,000 mcg Oral Daily   acetaminophen, albuterol, HYDROcodone-acetaminophen, nitroGLYCERIN, ondansetron (ZOFRAN) IV, polyethylene glycol  Assessment/ Plan:  75 y.o. male with medical problems of longstanding diabetes, hypertension, history of nephrolithiasis, hyperlipidemia, coronary artery disease status post PTCA, pulmonary fibrosis on 4 L chronic oxygen, obstructive sleep apnea, history of right adrenalectomy, left renal artery stenosis status post stent, pulmonary hypertension   Active Problems:   Acute on chronic diastolic CHF (congestive heart failure) (HCC)   Elevated troponin   CHF (congestive heart failure) (HCC)   Unstable angina (HCC)   Pulmonary hypertension (HCC)   Acute on chronic respiratory failure with hypoxia and hypercapnia (HCC)   CKD (chronic kidney disease), stage IV (HCC)   Interstitial lung disease ([place])   B12 deficiency   Pressure injury of skin   #. CKD st 4 Recent Labs    05/03/21 0716 05/04/21 0610 05/05/21 0555 05/06/21 0530  CREATININE 3.53* 3.72* 3.55* 3.65*   Patient has underlying CKD  stage IV.  CKD risk factors include diabetes type 2, hypertension, history of IV contrast exposure, atherosclerosis and age Serum creatinine has been fluctuating, Baseline appears to be 2.66/GFR 25 from April 17, 2021 Creatinine appears to have stabilized.  Edema appears unchanged.  Appetite remains poor.  No acute need for dialysis at this time, but monitoring closely.  We will order 24-hour urine creatinine clearance for evaluation of kidney function. Recommend Lasix drip at 4 mg/h.  We will monitor closely  Discussed the possibility of needing dialysis with patient and daughter.  Patient is agreeable to start if needed.  Agree to exhaust all means of medical management prior to making that decision.  #. Anemia of CKD  Lab Results  Component Value Date   HGB 9.2 (L) 05/06/2021   Hemoglobin at target, will continue to monitor  #. SHPTH  No results found for: PTH Lab Results  Component Value Date   PHOS 5.4 (H) 05/05/2021   Outpatient PTH 182 from February 08, 2021 Calcium improved but below target at 8.0 Continue on oral calcitriol  #. HTN with CKD and lower extremity edema Blood pressure within acceptable range for this patient  Initiating Lasix drip as above Monitoring closely  #. Diabetes type 2 with CKD Hgb A1c MFr Bld (%)  Date Value  04/06/2021 7.5 (H)  Management as per primary team Glucose within acceptable range this am  #Pulmonary hypertension Right heart catheterization December 06, 2020 Pulmonary artery pressure of 44 mm, right ventricular pressure of 48 mm 2D echo from April 18, 2021.  LVEF 60 to 19%, grade 1 diastolic dysfunction, elevated left atrial pressure, moderate thickening of aortic valve, mild calcification of mitral valve.  Cardiology following and defer cardiac cath at this time.     LOS: [place] 11/4/202212:27 PM  [place], [place]

## 2021-05-06 NOTE — Progress Notes (Signed)
Pt awakened for incontinence/pericare by Nurse tech.  Requesting Bipap be removed afterwards. Encouraged to wear while sleeping.  Pt states he will be awake and wants nasal cannula.  Placed back on nasal cannula without distress noted.

## 2021-05-06 NOTE — Progress Notes (Signed)
Physical Therapy Treatment Patient Details Name: Stephen [name] MRN: [id number] DOB: [birth year] Today's Date: 05/06/2021   History of Present Illness Stephen [name] is a 75 y.o. male with medical history significant for , interstitial lung disease, pulmonary hypertension, OSA with chronic respiratory failure on home O2 at 4L and Bipap, followed by pulmonology, as well as diastolic heart failure, CAD, DM, CKD IV, recently hospitalized from 10/16-10/29 with NSTEMI treated with heparin infusion, and respiratory failure secondary to CHF and COPD exacerbation who returns to the ED 2 days later with complaints of chest pain and shortness of breath. Chest pain was severe heaviness on his chest.EMS reports sats of 91% on his normal 4 L.  They attempted to increase it to 6 L with minimal improvement to 93% and and then put him on a nonrebreather and sats were 100%.  Wife contributes to history .    PT Comments    Entered patient room and agreeable to supine exercises and declining any OOB mobility due to increased difficulty breathing but oxygen saturation at 97% on 5L O2. Pt completed bout of exercises in bed to maintain strength and stating " I don't care what you do". Pt maintained >95% throughout treatment session. Pt left with all needs within reach. Will continue to work with patient during acute hospitalization and progress mobility as pt able and willing.    Recommendations for follow up therapy are one component of a multi-disciplinary discharge planning process, led by the attending physician.  Recommendations may be updated based on patient status, additional functional criteria and insurance authorization.  Follow Up Recommendations  Home health PT     Assistance Recommended at Discharge Frequent or constant Supervision/Assistance  Equipment Recommendations  None recommended by PT    Recommendations for Other Services       Precautions / Restrictions Precautions Precautions:  Fall Restrictions Weight Bearing Restrictions: No     Mobility  Bed Mobility               General bed mobility comments: Pt declining any OOB mobility due to increased shortness of breath    Transfers                        Ambulation/Gait                 Stairs             Wheelchair Mobility    Modified Rankin (Stroke Patients Only)       Balance                                            Cognition Arousal/Alertness: Awake/alert Behavior During Therapy: WFL for tasks assessed/performed Overall Cognitive Status: Within Functional Limits for tasks assessed                                          Exercises General Exercises - Upper Extremity Shoulder Flexion: AROM;Right;10 reps;Supine Elbow Flexion: AROM;Both;10 reps;Supine Digit Composite Flexion: AROM;Both;10 reps;Supine Composite Extension: AROM;Both;10 reps;Supine General Exercises - Lower Extremity Ankle Circles/Pumps: AROM;Strengthening;Both;20 reps;Supine Quad Sets: AROM;Strengthening;Both;10 reps;Supine Heel Slides: AROM;Both;10 reps;Supine Straight Leg Raises: AROM;Both;10 reps;Supine    General Comments        Pertinent Vitals/Pain Pain Assessment: No/denies  pain    Home Living                          Prior Function            PT Goals (current goals can now be found in the care plan section) Acute Rehab PT Goals Patient Stated Goal: go home PT Goal Formulation: With patient/family Time For Goal Achievement: 05/18/21 Potential to Achieve Goals: Fair Progress towards PT goals: Progressing toward goals    Frequency    Min 2X/week      PT Plan Current plan remains appropriate    Co-evaluation              AM-PAC PT "6 Clicks" Mobility   Outcome Measure  Help needed turning from your back to your side while in a flat bed without using bedrails?: A Lot Help needed moving from lying on your back  to sitting on the side of a flat bed without using bedrails?: A Lot Help needed moving to and from a bed to a chair (including a wheelchair)?: A Lot Help needed standing up from a chair using your arms (e.g., wheelchair or bedside chair)?: A Lot Help needed to walk in hospital room?: A Lot Help needed climbing 3-5 steps with a railing? : Total 6 Click Score: 11    End of Session   Activity Tolerance: Patient limited by fatigue Patient left: in bed;with call bell/phone within reach;with bed alarm set Nurse Communication: Mobility status PT Visit Diagnosis: Other abnormalities of gait and mobility (R26.89);Muscle weakness (generalized) (M62.81);Difficulty in walking, not elsewhere classified (R26.2)     Time: 8757-9728 PT Time Calculation (min) (ACUTE ONLY): 9 min  Charges:  $Therapeutic Activity: 8-22 mins                     Andrey Campanile, SPT   Andrey Campanile 05/06/2021, 4:05 PM

## 2021-05-06 NOTE — Progress Notes (Signed)
OT Cancellation Note  Patient Details Name: Stephen [name] MRN: [id number] DOB: 1945/07/13   Cancelled Treatment:    Reason Eval/Treat Not Completed: Other (comment) When OT attempted to see patient first time, pt stating he was preparing to put BiPap on and take a nap. OT setup a time to come back with pt and family member present in room for 4pm which both agreed. When OT re-presented, family member reports that PT worked with him during that time and he is now too fatigued. OT will f/u at later date/time as able.   Gerrianne Scale, [place], OTR/L ascom [phone] 05/06/21, 4:18 PM

## 2021-05-06 NOTE — Progress Notes (Signed)
Progress Note  Patient Name: Stephen [name] Date of Encounter: 05/06/2021  [facility] Cardiologist: Ida Rogue, MD   Subjective   Tolerated BiPAP overnight briefly, up until around 3-4 AM. Following this, he developed respiratory distress around 6 AM that improved with nebulizer and BiPAP. Currently on supplemental oxygen via nasal cannula at 4 L. Feels weak. No angina.   Inpatient Medications    Scheduled Meds:  aspirin EC  81 mg Oral Daily   atorvastatin  80 mg Oral QHS   calcitRIOL  0.25 mcg Oral Daily   carvedilol  25 mg Oral BID WC   cloNIDine  0.1 mg Oral TID   clopidogrel  75 mg Oral Daily   dorzolamide-timolol  1 drop Both Eyes BID   enoxaparin (LOVENOX) injection  30 mg Subcutaneous Q24H   ezetimibe  10 mg Oral Daily   feeding supplement  237 mL Oral BID BM   fluticasone furoate-vilanterol  1 puff Inhalation Daily   And   umeclidinium bromide  1 puff Inhalation Daily   furosemide  40 mg Intravenous Daily   gabapentin  100 mg Oral BID   hydrALAZINE  25 mg Oral TID   insulin aspart  0-20 Units Subcutaneous TID WC   insulin aspart  0-5 Units Subcutaneous QHS   insulin detemir  30 Units Subcutaneous QHS   isosorbide mononitrate  120 mg Oral Daily   levothyroxine  50 mcg Oral Q0600   montelukast  10 mg Oral Daily   pantoprazole  40 mg Oral Daily   sodium chloride flush  3 mL Intravenous Q12H   tamsulosin  0.4 mg Oral QHS   cyanocobalamin  1,000 mcg Oral Daily   Continuous Infusions:   PRN Meds: acetaminophen, albuterol, HYDROcodone-acetaminophen, nitroGLYCERIN, ondansetron (ZOFRAN) IV, polyethylene glycol   Vital Signs    Vitals:   05/06/21 0032 05/06/21 0338 05/06/21 0426 05/06/21 0811  BP: (!) 159/66 (!) 169/67  (!) 163/59  Pulse: (!) 56 (!) 59  (!) 56  Resp: 18 20  16   Temp: 99.2 F (37.3 C) 98.2 F (36.8 C)  98.4 F (36.9 C)  TempSrc:      SpO2: 100% 91%  99%  Weight:   116 kg   Height:        Intake/Output Summary (Last 24 hours)  at 05/06/2021 1020 Last data filed at 05/06/2021 0830 Gross per 24 hour  Intake --  Output 970 ml  Net -970 ml    Last 3 Weights 05/06/2021 05/05/2021 04/23/2021  Weight (lbs) 255 lb 11.7 oz 251 lb 5.2 oz 252 lb  Weight (kg) 116 kg 114 kg 114.306 kg  Some encounter information is confidential and restricted. Go to Review Flowsheets activity to see all data.      Telemetry    SR - Personally Reviewed  ECG    No new tracings - Personally Reviewed  Physical Exam   Constitutional:  Somnolent today. No distress.  Supine in bed, nasal cannula oxygen in place HENT: Grossly normal Eyes:  no discharge. No scleral icterus.  Neck: No JVD, no carotid bruits  Cardiovascular: Regular rate and rhythm, no murmurs appreciated Pulmonary/Chest: Clear to auscultation bilaterally, no wheezes or rails Abdominal: Soft.  no distension.  no tenderness.  Musculoskeletal: Normal range of motion Neurological:  normal muscle tone. Coordination normal. No atrophy Skin: Skin warm and dry Psychiatric: normal affect, pleasant   Labs    High Sensitivity Troponin:   Recent Labs  Lab 04/18/21 0505 04/18/21 1194  04/18/21 1240 04/04/2021 0213 04/21/2021 0344  TROPONINIHS 2,129* 1,997* 1,842* 1,417* 1,514*      Chemistry Recent Labs  Lab 05/04/21 0610 05/05/21 0555 05/06/21 0530  NA 136 138 138  K 4.4 4.2 4.6  CL 99 100 101  CO2 31 30 29   GLUCOSE 218* 132* 193*  BUN 72* 69* 70*  CREATININE 3.72* 3.55* 3.65*  CALCIUM 7.9* 8.0* 8.2*  MG  --  2.1  --   GFRNONAA 16* 17* 17*  ANIONGAP 6 8 8      Lipids No results for input(s): CHOL, TRIG, HDL, LABVLDL, LDLCALC, CHOLHDL in the last 168 hours.  Hematology Recent Labs  Lab 04/21/2021 0213 05/03/21 0716 05/05/21 0555 05/06/21 0530  WBC 11.6* 11.2* 7.5  --   RBC 3.40* 3.64* 3.09*  --   HGB 9.7* 10.1* 8.7* 9.2*  HCT 29.8* 32.0* 27.9* 29.7*  MCV 87.6 87.9 90.3  --   MCH 28.5 27.7 28.2  --   MCHC 32.6 31.6 31.2  --   RDW 15.9* 15.7* 15.6*  --    PLT 332 302 291  --     Thyroid  No results for input(s): TSH, FREET4 in the last 168 hours.   BNP Recent Labs  Lab 05/01/2021 0214  BNP 1,205.9*     DDimer No results for input(s): DDIMER in the last 168 hours.   Radiology    No results found.   Patient Profile  75 y.o. male with a hx of  pulmonary hypertension, chronic diastolic CHF, CAD, pulmonary fibrosis s/p wedge resection, obesity, chronic hypoxic respiratory failure on supplemental oxygen, COPD, OSA on CPAP, DM2, CKD stage IV, renal artery stenosis, and obesity who is being seen today for the evaluation of chest pain and elevated HS Tn.   1.  Acute on chronic HFpEF/pulmonary arterial hypertension:  -Likely exacerbated by end-stage pulmonary fibrosis -Remains on Lasix 40 IV daily, renal function relatively stable, mentating well   2.  CAD/non-STEMI:  -Known severe disease -Peak high-sensitivity troponin of 2129 -Cath in 08/2020 with moderate LAD and RCA disease and occluded left circumflex with right to left and left to left collaterals -Poor candidate for catheterization given renal dysfunction, mild encephalopathy, debilitated state -Continue medical management at this time,aspirin, Plavix, beta-blocker, statin, and nitrate   3.  Essential hypertension:  -Long history for controlled hypertension likely contributing to underlying renal dysfunction -Blood pressure remains elevated -Titrate hydralazine to 50 mg tid -Continue current doses of clonidine,  isosorbide, Coreg   4.  Hyperlipidemia:  -LDL of 69 in June 2022 -On statin  5.  Stage IV chronic kidney disease:  -Long history of poorly controlled hypertension -Nephrology following   6.  Interstitial lung disease/pulmonary fibrosis:  -Appears worse today -Requires BiPAP when napping and in the evening -Develops encephalopathy when he does not wear his BiPAP, cannot exclude CO2 retention  -[place] is poor -Not a surgical candidate given comorbid conditions   -Palliative care is following, appreciate their assistance   7.  Obstructive sleep apnea:  -Uses CPAP/BiPAP at night   For questions or updates, please contact [place] Please consult [email or web address] for contact info under        Signed, Christell Faith, PA-C  05/06/2021, 10:21 AM

## 2021-05-06 NOTE — Progress Notes (Signed)
PROGRESS NOTE    Stephen [name]  IHK:[id number] DOB: Nov 11, 1945 DOA: 04/05/2021 PCP: [name], MD    Brief Narrative:  This 74 year old man who is readmitted with shortness of breath and acute on chronic diastolic congestive heart failure with pulmonary hypertension. The patient's creatinine has worsened with diuresis. Patient empirically placed on heparin drip.  Cardiology and Nephrology following. He is readmitted after a long hospital course.  Patient was treated medically for NSTEMI.  Patient has acute on chronic hypoxic hypercapnic respiratory failure on BiPAP at night.  Also has pulmonary hypertension and interstitial lung disease chronically on 4 L of oxygen.  Patient has acute on chronic diastolic congestive heart failure and creatinine is very sensitive to diuretics.  Patient was also treated for UTI during the hospital course and acute metabolic encephalopathy and received 7 days of Rocephin for E. coli and Serratia in the urine. He is started on doxycycline for erythema of the left antecubital fossa to cover staph aureus from recent IV site.  Patient also had a superficial thrombophlebitis left arm which was treated with conservative management.  Assessment & Plan:   Active Problems:   Acute on chronic diastolic CHF (congestive heart failure) (HCC)   Elevated troponin   CHF (congestive heart failure) (HCC)   Unstable angina (HCC)   Pulmonary hypertension (HCC)   Acute on chronic respiratory failure with hypoxia and hypercapnia (HCC)   CKD (chronic kidney disease), stage IV (HCC)   Interstitial lung disease (HCC)   B12 deficiency   Pressure injury of skin   Acute on chronic diastolic congestive heart failure: Patient was started on Lasix IV 40 mg twice daily but due to increased serum creatinine it is reduced if 40 mg daily. Continued IV Lasix 40 mg daily.  Cardiology is following. Nephrology started Lasix gtt 11/4. Continue Coreg.  Monitor intake output  charting. Shortness of breath has slightly improved. Intake output charting not accurate.  NSTEMI: Patient presented with elevated troponin with history of CAD and recent NSTEMI. Completed heparin drip for 48 hours. Continue aspirin, Plavix, Imdur, Coreg, atorvastatin. Maximize medical management with chronic kidney disease. Cardiology think LHC or possible PCI will not improve patient's symptoms at this is largely due to pulmonary fibrosis. Poor candidate for LHC given renal dysfunction, mild encephalopathy and debilitated state.  Acute on chronic hypoxic and hypercapnic respiratory failure: Patient has history of pulmonary hypertension and interstial lung disease. Continue BiPAP as needed and nightly.   Continue supplemental oxygen 4 to 6 L during daytime.  Acute kidney injury on CKD stage IV: Serum creatinine up to 3.63 with diuresis. Nephrology is following, Patient is started on Lasix gtt. for dependent edema. Patient is at high risk for getting hemodialysis soon. He is agreeable to HD if needed. Continue to monitor serum creatinine.  Type 2 diabetes: Continue Levemir 30 units at night and regular insulin sliding scale.  Obstructive sleep apnea with morbid obesity: Continue CPAP at night.  Superficial thrombophlebitis left arm: Continue conservative management. completed heparin drip for 48 hours. Continue doxycycline for few days. Erythema has significantly improved.  Vitamin B12 deficiency:  Continue oral supplementation.  Palliative care consultation: Patient overall prognosis is poor.   Difficult to balance CHF, acute kidney injury on CKD, chronic hypoxic hypercarbic respiratory failure,end-stage lung disease with pulmonary fibrosis.  Patient wants to treat the treatable.  Generalized weakness: PT and OT eval : Recommended home health services.     DVT prophylaxis: Heparin sq Code Status:DNR Family Communication: Daughter at  bed side,. Disposition Plan:    Status is: Inpatient  Remains inpatient appropriate because: AKI, CHF.  Needs goals of care discussion.  May require renal replacement therapy.  Consultants:  Nephrology Cardiology  Procedures:   Antimicrobials:   Anti-infectives (From admission, onward)    Start     Dose/Rate Route Frequency Ordered Stop   04/17/2021 1000  doxycycline (VIBRA-TABS) tablet 100 mg        100 mg Oral Every 12 hours 04/30/2021 0725 05/05/21 2123        Subjective: Patient was seen and examined at bedside.  Overnight events noted.   He appears significantly deconditioned.  Patient had respiratory distress in the morning which got better with the nebulization and supplemental oxygen. He remains on 4 L of supplemental oxygen.  Objective: Vitals:   05/06/21 0338 05/06/21 0426 05/06/21 0811 05/06/21 1155  BP: (!) 169/67  (!) 163/59 (!) 125/57  Pulse: (!) 59  (!) 56 (!) 51  Resp: 20  16 17   Temp: 98.2 F (36.8 C)  98.4 F (36.9 C) 98.4 F (36.9 C)  TempSrc:      SpO2: 91%  99% 95%  Weight:  116 kg    Height:        Intake/Output Summary (Last 24 hours) at 05/06/2021 1316 Last data filed at 05/06/2021 1044 Gross per 24 hour  Intake 120 ml  Output 670 ml  Net -550 ml   Filed Weights   04/15/2021 0211 05/05/21 0500 05/06/21 0426  Weight: 114.3 kg 114 kg 116 kg    Examination:  General exam: Appears calm and comfortable , NAD  appears deconditioned. Respiratory system: Clear to auscultation. Respiratory effort normal. RR 17 Cardiovascular system: S1-S2 heard, regular rate and rhythm, no murmur. Gastrointestinal system: Abdomen is soft, nontender, nondistended, BS+ Central nervous system: Alert and oriented x 3. No focal neurological deficits. Extremities: No edema, no cyanosis, no clubbing. Skin: No rashes, lesions or ulcers Psychiatry: Judgement and insight appear normal. Mood & affect appropriate.     Data Reviewed: I have personally reviewed following labs and imaging  studies  CBC: Recent Labs  Lab 04/27/2021 0213 05/03/21 0716 05/05/21 0555 05/06/21 0530  WBC 11.6* 11.2* 7.5  --   NEUTROABS 9.8*  --   --   --   HGB 9.7* 10.1* 8.7* 9.2*  HCT 29.8* 32.0* 27.9* 29.7*  MCV 87.6 87.9 90.3  --   PLT 332 302 291  --    Basic Metabolic Panel: Recent Labs  Lab 04/28/2021 0213 05/03/21 0716 05/04/21 0610 05/05/21 0555 05/06/21 0530  NA 135 136 136 138 138  K 4.5 4.6 4.4 4.2 4.6  CL 99 99 99 100 101  CO2 28 26 31 30 29   GLUCOSE 241* 223* 218* 132* 193*  BUN 72* 67* 72* 69* 70*  CREATININE 3.12* 3.53* 3.72* 3.55* 3.65*  CALCIUM 8.2* 8.0* 7.9* 8.0* 8.2*  MG  --   --   --  2.1  --   PHOS  --   --   --  5.4*  --    GFR: Estimated Creatinine Clearance: 21.2 mL/min (A) (by C-G formula based on SCr of 3.65 mg/dL (H)). Liver Function Tests: No results for input(s): AST, ALT, ALKPHOS, BILITOT, PROT, ALBUMIN in the last 168 hours. No results for input(s): LIPASE, AMYLASE in the last 168 hours. No results for input(s): AMMONIA in the last 168 hours. Coagulation Profile: Recent Labs  Lab 04/17/2021 0447  INR 1.2   Cardiac Enzymes:  No results for input(s): CKTOTAL, CKMB, CKMBINDEX, TROPONINI in the last 168 hours. BNP (last 3 results) No results for input(s): PROBNP in the last 8760 hours. HbA1C: No results for input(s): HGBA1C in the last 72 hours.  CBG: Recent Labs  Lab 05/05/21 1127 05/05/21 1536 05/05/21 2051 05/06/21 0811 05/06/21 1156  GLUCAP 152* 188* 211* 172* 188*   Lipid Profile: No results for input(s): CHOL, HDL, LDLCALC, TRIG, CHOLHDL, LDLDIRECT in the last 72 hours. Thyroid Function Tests: No results for input(s): TSH, T4TOTAL, FREET4, T3FREE, THYROIDAB in the last 72 hours. Anemia Panel: No results for input(s): VITAMINB12, FOLATE, FERRITIN, TIBC, IRON, RETICCTPCT in the last 72 hours. Sepsis Labs: No results for input(s): PROCALCITON, LATICACIDVEN in the last 168 hours.  Recent Results (from the past 240 hour(s))  Resp  Panel by RT-PCR (Flu A&B, Covid)     Status: None   Collection Time: 04/02/2021 10:46 AM  Result Value Ref Range Status   SARS Coronavirus 2 by RT PCR NEGATIVE NEGATIVE Final    Comment: (NOTE) SARS-CoV-2 target nucleic acids are NOT DETECTED.  The SARS-CoV-2 RNA is generally detectable in upper respiratory specimens during the acute phase of infection. The lowest concentration of SARS-CoV-2 viral copies this assay can detect is 138 copies/mL. A negative result does not preclude SARS-Cov-2 infection and should not be used as the sole basis for treatment or other patient management decisions. A negative result may occur with  improper specimen collection/handling, submission of specimen other than nasopharyngeal swab, presence of viral mutation(s) within the areas targeted by this assay, and inadequate number of viral copies(<138 copies/mL). A negative result must be combined with clinical observations, patient history, and epidemiological information. The expected result is Negative.  Fact Sheet for Patients:  EntrepreneurPulse.com.au  Fact Sheet for Healthcare Providers:  IncredibleEmployment.be  This test is no t yet approved or cleared by the [place] FDA and  has been authorized for detection and/or diagnosis of SARS-CoV-2 by FDA under an Emergency Use Authorization (EUA). This EUA will remain  in effect (meaning this test can be used) for the duration of the COVID-19 declaration under Section 564(b)(1) of the Act, 21 U.S.C.section 360bbb-3(b)(1), unless the authorization is terminated  or revoked sooner.       Influenza A by PCR NEGATIVE NEGATIVE Final   Influenza B by PCR NEGATIVE NEGATIVE Final    Comment: (NOTE) The Xpert Xpress SARS-CoV-2/FLU/RSV plus assay is intended as an aid in the diagnosis of influenza from Nasopharyngeal swab specimens and should not be used as a sole basis for treatment. Nasal washings and aspirates are  unacceptable for Xpert Xpress SARS-CoV-2/FLU/RSV testing.  Fact Sheet for Patients: EntrepreneurPulse.com.au  Fact Sheet for Healthcare Providers: IncredibleEmployment.be  This test is not yet approved or cleared by the [place] FDA and has been authorized for detection and/or diagnosis of SARS-CoV-2 by FDA under an Emergency Use Authorization (EUA). This EUA will remain in effect (meaning this test can be used) for the duration of the COVID-19 declaration under Section 564(b)(1) of the Act, 21 U.S.C. section 360bbb-3(b)(1), unless the authorization is terminated or revoked.  Performed at [facility], [address], [place], [place] [address]     Radiology Studies: No results found.  Scheduled Meds:  aspirin EC  81 mg Oral Daily   atorvastatin  80 mg Oral QHS   calcitRIOL  0.25 mcg Oral Daily   carvedilol  25 mg Oral BID WC   cloNIDine  0.1 mg Oral TID  clopidogrel  75 mg Oral Daily   dorzolamide-timolol  1 drop Both Eyes BID   enoxaparin (LOVENOX) injection  30 mg Subcutaneous Q24H   ezetimibe  10 mg Oral Daily   feeding supplement  237 mL Oral BID BM   fluticasone furoate-vilanterol  1 puff Inhalation Daily   And   umeclidinium bromide  1 puff Inhalation Daily   gabapentin  100 mg Oral BID   hydrALAZINE  25 mg Oral TID   insulin aspart  0-20 Units Subcutaneous TID WC   insulin aspart  0-5 Units Subcutaneous QHS   insulin detemir  30 Units Subcutaneous QHS   isosorbide mononitrate  120 mg Oral Daily   levothyroxine  50 mcg Oral Q0600   montelukast  10 mg Oral Daily   pantoprazole  40 mg Oral Daily   sodium chloride flush  3 mL Intravenous Q12H   tamsulosin  0.4 mg Oral QHS   cyanocobalamin  1,000 mcg Oral Daily   Continuous Infusions:  furosemide (LASIX) 200 mg in dextrose 5% 100 mL (2mg /mL) infusion       LOS: 3 days    Time spent: 25 mins    Shawna Clamp, MD [facility]   If 7PM-7AM,  please contact night-coverage

## 2021-05-06 NOTE — Progress Notes (Addendum)
Pt rested without distress on nasal cannula.  Family member in to visit at 0625.  Requesting to be placed back on Bipap at this time.  No distress noted. Pt placed back on Bipap with previous settings maintained.

## 2021-05-07 DIAGNOSIS — I5033 Acute on chronic diastolic (congestive) heart failure: Secondary | ICD-10-CM | POA: Diagnosis not present

## 2021-05-07 DIAGNOSIS — I214 Non-ST elevation (NSTEMI) myocardial infarction: Secondary | ICD-10-CM | POA: Diagnosis not present

## 2021-05-07 DIAGNOSIS — J849 Interstitial pulmonary disease, unspecified: Secondary | ICD-10-CM | POA: Diagnosis not present

## 2021-05-07 LAB — CBC
HCT: 29.8 % — ABNORMAL LOW (ref 39.0–52.0)
Hemoglobin: 9 g/dL — ABNORMAL LOW (ref 13.0–17.0)
MCH: 27.6 pg (ref 26.0–34.0)
MCHC: 30.2 g/dL (ref 30.0–36.0)
MCV: 91.4 fL (ref 80.0–100.0)
Platelets: 286 10*3/uL (ref 150–400)
RBC: 3.26 MIL/uL — ABNORMAL LOW (ref 4.22–5.81)
RDW: 15.6 % — ABNORMAL HIGH (ref 11.5–15.5)
WBC: 8.9 10*3/uL (ref 4.0–10.5)
nRBC: 0 % (ref 0.0–0.2)

## 2021-05-07 LAB — CREATININE, URINE, 24 HOUR
Collection Interval-UCRE24: 24 hours
Creatinine, 24H Ur: 1386 mg/d (ref 800–2000)
Creatinine, Urine: 88 mg/dL
Urine Total Volume-UCRE24: 1575 mL

## 2021-05-07 LAB — GLUCOSE, CAPILLARY
Glucose-Capillary: 206 mg/dL — ABNORMAL HIGH (ref 70–99)
Glucose-Capillary: 200 mg/dL — ABNORMAL HIGH (ref 70–99)
Glucose-Capillary: 223 mg/dL — ABNORMAL HIGH (ref 70–99)
Glucose-Capillary: 201 mg/dL — ABNORMAL HIGH (ref 70–99)

## 2021-05-07 LAB — BASIC METABOLIC PANEL
Anion gap: 7 (ref 5–15)
BUN: 73 mg/dL — ABNORMAL HIGH (ref 8–23)
CO2: 32 mmol/L (ref 22–32)
Calcium: 8.3 mg/dL — ABNORMAL LOW (ref 8.9–10.3)
Chloride: 98 mmol/L (ref 98–111)
Creatinine, Ser: 3.44 mg/dL — ABNORMAL HIGH (ref 0.61–1.24)
GFR, Estimated: 18 mL/min — ABNORMAL LOW (ref >60)
Glucose, Bld: 216 mg/dL — ABNORMAL HIGH (ref 70–99)
Potassium: 4.6 mmol/L (ref 3.5–5.1)
Sodium: 137 mmol/L (ref 135–145)

## 2021-05-07 LAB — PHOSPHORUS: Phosphorus: 4.3 mg/dL (ref 2.5–4.6)

## 2021-05-07 LAB — MAGNESIUM: Magnesium: 2.2 mg/dL (ref 1.7–2.4)

## 2021-05-07 MED ORDER — BISACODYL 10 MG RE SUPP
10.0000 mg | Freq: Once | RECTAL | Status: AC
  Administered 2021-05-07: 10 mg via RECTAL
  Filled 2021-05-07: qty 1

## 2021-05-07 NOTE — Progress Notes (Addendum)
[facility], [place] 05/07/21  Subjective:   LOS: 4 11/04 0701 - 11/05 0700 In: 319.9 [P.O.:300; I.V.:19.9] Out: 1050 [Urine:1050] Patient known to our practice from outpatient follow-up.  He is followed by Dr. Cherylann Ratel and was last seen on August 9. He has medical problems of longstanding diabetes, hypertension, history of nephrolithiasis, hyperlipidemia, coronary artery disease status post PTCA, pulmonary fibrosis on 4 L chronic oxygen, obstructive sleep apnea, history of right adrenalectomy, left renal artery stenosis status post stent, pulmonary hypertension  Patient appears to be resting comfortably, daughter at bedside States patient has shortness of breath this a.m. and received breathing treatment.  Appetite remains poor, drinking Ensure between meals.  Patient seen later in the morning on BiPAP.  Patient main complaint in today visit was that he was having problems emptying his bladder. On further discussion patient informed me that they had to put a Foley this morning. I then discussed this with the patient's care team and was informed the patient required Foley cath patient this morning and had 900 mL of urine output after catheter was placed   Objective:  Vital signs in last 24 hours:  Temp:  [98 F (36.7 C)-98.9 F (37.2 C)] 98 F (36.7 C) (11/05 1123) Pulse Rate:  [55-64] 56 (11/05 1123) Resp:  [16-20] 19 (11/05 1123) BP: (123-175)/(53-77) 123/58 (11/05 1123) SpO2:  [93 %-99 %] 96 % (11/05 1123) Weight:  [116.1 kg] 116.1 kg (11/05 0500)  Weight change: 0.1 kg Filed Weights   05/05/21 0500 05/06/21 0426 05/07/21 0500  Weight: 114 kg 116 kg 116.1 kg    Intake/Output:    Intake/Output Summary (Last 24 hours) at 05/07/2021 1327 Last data filed at 05/07/2021 1100 Gross per 24 hour  Intake 199.94 ml  Output 1550 ml  Net -1350.06 ml     Physical Exam: General: No acute distress, laying in the bed  HEENT Moist oral mucous membranes   Pulm/lungs Bibasilar crackles, 4 L O2 [place]  CVS/Heart Holosystolic murmur, no rub  Abdomen:  Soft, obese, nontender  Extremities: 2+ pitting edema bilaterally  Neurologic: Alert, oriented  Skin: Warm, dry          Basic Metabolic Panel:  Recent Labs  Lab 05/03/21 0716 05/04/21 0610 05/05/21 0555 05/06/21 0530 05/07/21 0551  NA 136 136 138 138 137  K 4.6 4.4 4.2 4.6 4.6  CL 99 99 100 101 98  CO2 26 31 30 29  32  GLUCOSE 223* 218* 132* 193* 216*  BUN 67* 72* 69* 70* 73*  CREATININE 3.53* 3.72* 3.55* 3.65* 3.44*  CALCIUM 8.0* 7.9* 8.0* 8.2* 8.3*  MG  --   --  2.1  --  2.2  PHOS  --   --  5.4*  --  4.3     CBC: Recent Labs  Lab 05/01/2021 0213 05/03/21 0716 05/05/21 0555 05/06/21 0530 05/07/21 0551  WBC 11.6* 11.2* 7.5  --  8.9  NEUTROABS 9.8*  --   --   --   --   HGB 9.7* 10.1* 8.7* 9.2* 9.0*  HCT 29.8* 32.0* 27.9* 29.7* 29.8*  MCV 87.6 87.9 90.3  --  91.4  PLT 332 302 291  --  286     No results found for: HEPBSAG, HEPBSAB, HEPBIGM    Microbiology:  Recent Results (from the past 240 hour(s))  Resp Panel by RT-PCR (Flu A&B, Covid)     Status: None   Collection Time: 04/17/2021 10:46 AM  Result Value Ref Range Status  SARS Coronavirus 2 by RT PCR NEGATIVE NEGATIVE Final    Comment: (NOTE) SARS-CoV-2 target nucleic acids are NOT DETECTED.  The SARS-CoV-2 RNA is generally detectable in upper respiratory specimens during the acute phase of infection. The lowest concentration of SARS-CoV-2 viral copies this assay can detect is 138 copies/mL. A negative result does not preclude SARS-Cov-2 infection and should not be used as the sole basis for treatment or other patient management decisions. A negative result may occur with  improper specimen collection/handling, submission of specimen other than nasopharyngeal swab, presence of viral mutation(s) within the areas targeted by this assay, and inadequate number of viral copies(<138 copies/mL). A negative result  must be combined with clinical observations, patient history, and epidemiological information. The expected result is Negative.  Fact Sheet for Patients:  BloggerCourse.com  Fact Sheet for Healthcare Providers:  SeriousBroker.it  This test is no t yet approved or cleared by the [place] FDA and  has been authorized for detection and/or diagnosis of SARS-CoV-2 by FDA under an Emergency Use Authorization (EUA). This EUA will remain  in effect (meaning this test can be used) for the duration of the COVID-19 declaration under Section 564(b)(1) of the Act, 21 U.S.C.section 360bbb-3(b)(1), unless the authorization is terminated  or revoked sooner.       Influenza A by PCR NEGATIVE NEGATIVE Final   Influenza B by PCR NEGATIVE NEGATIVE Final    Comment: (NOTE) The Xpert Xpress SARS-CoV-2/FLU/RSV plus assay is intended as an aid in the diagnosis of influenza from Nasopharyngeal swab specimens and should not be used as a sole basis for treatment. Nasal washings and aspirates are unacceptable for Xpert Xpress SARS-CoV-2/FLU/RSV testing.  Fact Sheet for Patients: BloggerCourse.com  Fact Sheet for Healthcare Providers: SeriousBroker.it  This test is not yet approved or cleared by the [place] FDA and has been authorized for detection and/or diagnosis of SARS-CoV-2 by FDA under an Emergency Use Authorization (EUA). This EUA will remain in effect (meaning this test can be used) for the duration of the COVID-19 declaration under Section 564(b)(1) of the Act, 21 U.S.C. section 360bbb-3(b)(1), unless the authorization is terminated or revoked.  Performed at [facility], [address] [place], [place] [address]     Coagulation Studies: No results for input(s): LABPROT, INR in the last 72 hours.   Urinalysis: No results for input(s): COLORURINE, LABSPEC, PHURINE,  GLUCOSEU, HGBUR, BILIRUBINUR, KETONESUR, PROTEINUR, UROBILINOGEN, NITRITE, LEUKOCYTESUR in the last 72 hours.  Invalid input(s): APPERANCEUR     Imaging: No results found.   Medications:    furosemide (LASIX) 200 mg in dextrose 5% 100 mL (2mg /mL) infusion Stopped (05/06/21 1645)     aspirin EC  81 mg Oral Daily   atorvastatin  80 mg Oral QHS   calcitRIOL  0.25 mcg Oral Daily   carvedilol  25 mg Oral BID WC   cloNIDine  0.1 mg Oral TID   clopidogrel  75 mg Oral Daily   dorzolamide-timolol  1 drop Both Eyes BID   enoxaparin (LOVENOX) injection  30 mg Subcutaneous Q24H   ezetimibe  10 mg Oral Daily   feeding supplement  237 mL Oral BID BM   fluticasone furoate-vilanterol  1 puff Inhalation Daily   And   umeclidinium bromide  1 puff Inhalation Daily   gabapentin  100 mg Oral BID   hydrALAZINE  25 mg Oral TID   insulin aspart  0-20 Units Subcutaneous TID WC   insulin aspart  0-5 Units Subcutaneous QHS  insulin detemir  30 Units Subcutaneous QHS   isosorbide mononitrate  120 mg Oral Daily   levothyroxine  50 mcg Oral Q0600   montelukast  10 mg Oral Daily   pantoprazole  40 mg Oral Daily   sodium chloride flush  3 mL Intravenous Q12H   tamsulosin  0.4 mg Oral QHS   cyanocobalamin  1,000 mcg Oral Daily   acetaminophen, albuterol, HYDROcodone-acetaminophen, nitroGLYCERIN, ondansetron (ZOFRAN) IV, polyethylene glycol  Assessment/ Plan:  75 y.o. male with medical problems of longstanding diabetes, hypertension, history of nephrolithiasis, hyperlipidemia, coronary artery disease status post PTCA, pulmonary fibrosis on 4 L chronic oxygen, obstructive sleep apnea, history of right adrenalectomy, left renal artery stenosis status post stent, pulmonary hypertension   Active Problems:   Acute on chronic diastolic CHF (congestive heart failure) (HCC)   Elevated troponin   CHF (congestive heart failure) (HCC)   Unstable angina (HCC)   Pulmonary hypertension (HCC)   Acute on chronic  respiratory failure with hypoxia and hypercapnia (HCC)   CKD (chronic kidney disease), stage IV (HCC)   Interstitial lung disease (HCC)   B12 deficiency   Pressure injury of skin    Impression  1)Renal   Acute kidney injury Patient has AKI secondary to ATN Patient has AKI secondary to recent hemodynamically instability Patient currently admitted with the non-ST elevation MI and acute on chronic diastolic CHF Patient AKI possibly has a component of obstruction as well as patient was having incomplete bladder emptying.  Patient has AKI on CKD Patient has CKD stage IV Patient has CKD going back to 2013 Patient has CKD secondary to diabetes mellitus There Is a possible contribution from hypertension. Patient CKD has been marked with multiple episode of AKI's   Patient AKI is currently stable Patient creatinine is today at 3.4. I again discussed with the patient about possible need for renal placement therapy soon Patient voiced understanding   2)HTN    Blood pressure is stable    3)Anemia of chronic disease  CBC Latest Ref Rng & Units 05/07/2021 05/06/2021 05/05/2021  WBC 4.0 - 10.5 K/uL 8.9 - 7.5  Hemoglobin 13.0 - 17.0 g/dL 9.0(L) 9.2(L) 8.7(L)  Hematocrit 39.0 - 52.0 % 29.8(L) 29.7(L) 27.9(L)  Platelets 150 - 400 K/uL 286 - 291       HGb at goal (9--11) Will ask for anemia profile Patient iron saturation done on October 22 showed patient had iron deficiency anemia  4) Secondary hyperparathyroidism -CKD Mineral-Bone Disorder    Lab Results  Component Value Date   CALCIUM 8.3 (L) 05/07/2021   CAION 1.18 08/31/2010   PHOS 4.3 05/07/2021    Secondary Hyperparathyroidism  present  Patient has history of secondary hyperparathyroidism-patient intact PTH was 182 as an outpatient  Phosphorus at goal.   5)Acute on chronic diastolic CHF Patient is currently on Lasix drip at 4 mg/h Patient is currently negative by approximately 4 L   6) Electrolytes   BMP  Latest Ref Rng & Units 05/07/2021 05/06/2021 05/05/2021  Glucose 70 - 99 mg/dL 161(W) 960(A) 540(J)  BUN 8 - 23 mg/dL 81(X) 91(Y) 78(G)  Creatinine 0.61 - 1.24 mg/dL 9.56(O) 1.30(Q) 6.57(Q)  BUN/Creat Ratio 10 - 24 - - -  Sodium 135 - 145 mmol/L 137 138 138  Potassium 3.5 - 5.1 mmol/L 4.6 4.6 4.2  Chloride 98 - 111 mmol/L 98 101 100  CO2 22 - 32 mmol/L 32 29 30  Calcium 8.9 - 10.3 mg/dL 8.3(L) 8.2(L) 8.0(L)     Sodium  Normonatremic   Potassium Normokalemic    7)Acid base    Co2 at goal  8) Acute on chronic hypoxic and hypercapnic respiratory failure Patient has history of pulm hypertension and interstitial lung disease Patient is currently on BiPAP as needed This is being closely followed by the primary team  9) NSTEMI Primary team and cardiology is following Neurology has deferred catheterization for now  Plan   Will ask for anemia work-up We will continue the current diuretics     LOS: 4 Roneshia Drew s [facility] 11/5/20221:27 PM  [facility] [place], [place] [phone]

## 2021-05-07 NOTE — Progress Notes (Signed)
Progress Note  Patient Name: Stephen [name] Date of Encounter: 05/07/2021  [facility] Cardiologist: Julien Nordmann, MD   Subjective   Sleepy and lethargic during interview. Patient is DNR, palliative care is following. On lasix drip, UOP -1L.  Inpatient Medications    Scheduled Meds:  aspirin EC  81 mg Oral Daily   atorvastatin  80 mg Oral QHS   calcitRIOL  0.25 mcg Oral Daily   carvedilol  25 mg Oral BID WC   cloNIDine  0.1 mg Oral TID   clopidogrel  75 mg Oral Daily   dorzolamide-timolol  1 drop Both Eyes BID   enoxaparin (LOVENOX) injection  30 mg Subcutaneous Q24H   ezetimibe  10 mg Oral Daily   feeding supplement  237 mL Oral BID BM   fluticasone furoate-vilanterol  1 puff Inhalation Daily   And   umeclidinium bromide  1 puff Inhalation Daily   gabapentin  100 mg Oral BID   hydrALAZINE  25 mg Oral TID   insulin aspart  0-20 Units Subcutaneous TID WC   insulin aspart  0-5 Units Subcutaneous QHS   insulin detemir  30 Units Subcutaneous QHS   isosorbide mononitrate  120 mg Oral Daily   levothyroxine  50 mcg Oral Q0600   montelukast  10 mg Oral Daily   pantoprazole  40 mg Oral Daily   sodium chloride flush  3 mL Intravenous Q12H   tamsulosin  0.4 mg Oral QHS   cyanocobalamin  1,000 mcg Oral Daily   Continuous Infusions:  furosemide (LASIX) 200 mg in dextrose 5% 100 mL (2mg /mL) infusion Stopped (05/06/21 1645)   PRN Meds: acetaminophen, albuterol, HYDROcodone-acetaminophen, nitroGLYCERIN, ondansetron (ZOFRAN) IV, polyethylene glycol   Vital Signs    Vitals:   05/06/21 2009 05/06/21 2359 05/07/21 0422 05/07/21 0500  BP: (!) 175/63  (!) 157/77   Pulse: (!) 55  64   Resp: 18 16 20    Temp: 98.3 F (36.8 C)  98.1 F (36.7 C)   TempSrc:      SpO2: 98%  93%   Weight:    116.1 kg  Height:        Intake/Output Summary (Last 24 hours) at 05/07/2021 0804 Last data filed at 05/07/2021 0500 Gross per 24 hour  Intake 319.94 ml  Output 1050 ml  Net -730.06  ml   Last 3 Weights 05/07/2021 05/06/2021 05/05/2021  Weight (lbs) 255 lb 15.3 oz 255 lb 11.7 oz 251 lb 5.2 oz  Weight (kg) 116.1 kg 116 kg 114 kg  Some encounter information is confidential and restricted. Go to Review Flowsheets activity to see all data.      Telemetry    SB, HR 50s - Personally Reviewed  ECG    No new - Personally Reviewed  Physical Exam   GEN: No acute distress.   Neck: No JVD Cardiac: RR, bradycardia, no murmurs, rubs, or gallops.  Respiratory: course breaths sounds. GI: Soft, nontender, non-distended  MS: 1-2+ edema; No deformity. Neuro:  Nonfocal  Psych: Normal affect   Labs    High Sensitivity Troponin:   Recent Labs  Lab 04/18/21 0505 04/18/21 0658 04/18/21 1240 04/09/2021 0213 04/07/2021 0344  TROPONINIHS 2,129* 1,997* 1,842* 1,417* 1,514*     Chemistry Recent Labs  Lab 05/05/21 0555 05/06/21 0530 05/07/21 0551  NA 138 138 137  K 4.2 4.6 4.6  CL 100 101 98  CO2 30 29 32  GLUCOSE 132* 193* 216*  BUN 69* 70* 73*  CREATININE 3.55*  3.65* 3.44*  CALCIUM 8.0* 8.2* 8.3*  MG 2.1  --  2.2  GFRNONAA 17* 17* 18*  ANIONGAP 8 8 7     Lipids No results for input(s): CHOL, TRIG, HDL, LABVLDL, LDLCALC, CHOLHDL in the last 168 hours.  Hematology Recent Labs  Lab 05/03/21 0716 05/05/21 0555 05/06/21 0530 05/07/21 0551  WBC 11.2* 7.5  --  8.9  RBC 3.64* 3.09*  --  3.26*  HGB 10.1* 8.7* 9.2* 9.0*  HCT 32.0* 27.9* 29.7* 29.8*  MCV 87.9 90.3  --  91.4  MCH 27.7 28.2  --  27.6  MCHC 31.6 31.2  --  30.2  RDW 15.7* 15.6*  --  15.6*  PLT 302 291  --  286   Thyroid No results for input(s): TSH, FREET4 in the last 168 hours.  BNP Recent Labs  Lab 04/03/2021 0214  BNP 1,205.9*    DDimer No results for input(s): DDIMER in the last 168 hours.   Radiology    No results found.  Cardiac Studies   Echo 04/18/21  1. Left ventricular ejection fraction, by estimation, is 60 to 65%. The  left ventricle has normal function. Left ventricular  endocardial border  not optimally defined to evaluate regional wall motion. The left  ventricular internal cavity size was mildly  dilated. There is mild left ventricular hypertrophy. Left ventricular  diastolic parameters are consistent with Grade I diastolic dysfunction  (impaired relaxation). Elevated left atrial pressure.   2. Right ventricular systolic function is normal. The right ventricular  size is not well visualized.   3. The mitral valve is abnormal. Trivial mitral valve regurgitation. Mild  mitral stenosis. The mean mitral valve gradient is 5.0 mmHg.   4. Tricuspid valve regurgitation not well assessed.   5. The aortic valve has an indeterminant number of cusps. There is mild  calcification of the aortic valve. There is moderate thickening of the  aortic valve. Aortic valve regurgitation is trivial. Mild to moderate  aortic valve sclerosis/calcification is  present, without any evidence of aortic stenosis.   Right heart cath 12/2020 Successful right heart catheterization via the right brachial vein. Right heart catheterization showed mildly to moderately elevated filling pressures, mild pulmonary hypertension and high cardiac output.   RA: 13 mmHg PCW: 20 mmHg with a V wave of 27 mmHg PA: 44/ 18 with a mean of 30 mmHg.  Pulmonary vascular resistance is only 1.14 Woods unit RV: 48 /12 mmHg PA sat is 97% with cardiac output of 8.79 and cardiac index of 3.98.   Recommendations: Recommend increasing torsemide to 40 mg once daily.  Check basic metabolic profile in 1 week.  Patient Profile     75 y.o. male with a hx of  pulmonary hypertension, chronic diastolic CHF, CAD, pulmonary fibrosis s/p wedge resection, obesity, chronic hypoxic respiratory failure on supplemental oxygen, COPD, OSA on CPAP, DM2, CKD stage IV, renal artery stenosis, and obesity who is being seen today for the evaluation of chest pain and elevated HS Tn.  Assessment & Plan    Acute on chronic  HFpEF Pulmonary HTN - in the setting of end stage pulmonary fibrosis - IV lasix drip 4mg /hr - UOP -1L, net -3.7L  CAD/NSTEMI - known severe disease - HS trop peak 2129 - s/p IV heparin x 48 hours - cath in 08/2020 showed moderate LAD and RCA disease and occluded left circumflex with R>L collaterals - poor candidate for cath given renal dysfunction, mild encephalopathy - continue medical management with Aspirin, Plavix,  BB, statin, nitrate  HTN - hydralazine 50mg TID - clonidine 0.1 mg TID - Isosorbide 120mg  daily - coreg 25mg  BID - bps mildly elevated  HLD - LDL 69 in June 2022 - continue statin  CKD stage IV - kidney function worsened and he was started on lasix drip - nephrology following, may need HD  ILD/pulmonary fibrosis - required Bipap intermittently - palliative care  OSA - uses CPAP/Bipap at night  For questions or updates, please contact [facility] Please consult [email or web address] for contact info under        Signed, Joseandres Mazer David Stall, PA-C  05/07/2021, 8:04 AM

## 2021-05-07 NOTE — Progress Notes (Addendum)
PROGRESS NOTE  SIMS LADAY UKG:[id number] DOB: 03-23-1946 DOA: 04/20/2021 PCP: [name], MD  HPI/Recap of past 24 hours:  This 75 year old man who is readmitted with shortness of breath and acute on chronic diastolic congestive heart failure with pulmonary hypertension. The patient's creatinine has worsened with diuresis. Patient empirically placed on heparin drip.  Cardiology and Nephrology following. He is readmitted after a long hospital course.  Patient was treated medically for NSTEMI.  Patient has acute on chronic hypoxic hypercapnic respiratory failure on BiPAP at night.  Also has pulmonary hypertension and interstitial lung disease chronically on 4 L of oxygen.  Patient has acute on chronic diastolic congestive heart failure and creatinine is very sensitive to diuretics.  Patient was also treated for UTI during the hospital course and acute metabolic encephalopathy and received 7 days of Rocephin for E. coli and Serratia in the urine. He is started on doxycycline for erythema of the left antecubital fossa to cover staph aureus from recent IV site.  Patient also had a superficial thrombophlebitis left arm which was treated with conservative management  Subjective: May 07, 2021: Patient seen and examined at bedside with his daughter He was complaining of abdominal pain in the lower portion he has not been able to make urine. In-N-Out cath was done and it showed some obstruction that he has had a couple of In-N-Out catheterizations Nephrology was consulted and is seeing patient  Assessment/Plan: Active Problems:   Acute on chronic diastolic CHF (congestive heart failure) (HCC)   Elevated troponin   CHF (congestive heart failure) (HCC)   Unstable angina ([place])   Pulmonary hypertension (HCC)   Acute on chronic respiratory failure with hypoxia and hypercapnia (HCC)   CKD (chronic kidney disease), stage IV (HCC)   Interstitial lung disease ([place])   B12 deficiency    Pressure injury of skin   Acute on chronic diastolic congestive heart failure Patient is on continuous IV Lasix drip Continue Coreg   Non-STEMI Patient presented with elevated troponin Completed heparin drip for 48 hours Continue aspirin Plavix Imdur Coreg and atorvastatin Cardiology is following Cardiology does not think LHC or possible PCI will improve patient's symptoms and is largely due to pulmonary fibrosis next  Acute on chronic hypoxic and hypercarbic respiratory failure Patient has a history of pulmonary hypertension and interstitial lung disease Continue BiPAP as needed and at night Continue supplemental oxygen of 4 to 6 L during daytime  Acute kidney injury on chronic kidney disease stage IV Serum creatinine is elevated continue with diuresis Nephrology is following He was started on Lexis drip for dependent edema We will continue to monitor his creatinine  Urinary retention.  Patient had a couple of Inderal in and out catheterization which did not resolve his retention he had up to 900 cc of residual urine that was removed  Type 2 diabetes mellitus continue Levemir and sliding scale insulin Obstructive sleep apnea continue CPAP at night  Code Status: DNR    Severity of Illness: The appropriate patient status for this patient is INPATIENT. Inpatient status is judged to be reasonable and necessary in order to provide the required intensity of service to ensure the patient's safety. The patient's presenting symptoms, physical exam findings, and initial radiographic and laboratory data in the context of their chronic comorbidities is felt to place them at high risk for further clinical deterioration. Furthermore, it is not anticipated that the patient will be medically stable for discharge from the hospital within 2 midnights of admission.   *  I certify that at the point of admission it is my clinical judgment that the patient will require inpatient hospital care spanning  beyond 2 midnights from the point of admission due to high intensity of service, high risk for further deterioration and high frequency of surveillance required.*   Family Communication: Daughter Stacy at bedside  Disposition Plan:    Status is: Inpatient   Dispo: The patient is from: Home              Anticipated d/c is to:               Anticipated d/c date is:               Patient currently not medically stable for discharge  Consultants: Cardiology Nephrology Palliative care  Procedures: None  Antimicrobials: Doxycycline    DVT prophylaxis:      Objective: Vitals:   05/06/21 2359 05/07/21 0422 05/07/21 0500 05/07/21 0810  BP:  (!) 157/77  (!) 162/53  Pulse:  64  (!) 55  Resp: 16 20  20   Temp:  98.1 F (36.7 C)  98.9 F (37.2 C)  TempSrc:      SpO2:  93%  98%  Weight:   116.1 kg   Height:        Intake/Output Summary (Last 24 hours) at 05/07/2021 0816 Last data filed at 05/07/2021 0500 Gross per 24 hour  Intake 319.94 ml  Output 1050 ml  Net -730.06 ml   Filed Weights   05/05/21 0500 05/06/21 0426 05/07/21 0500  Weight: 114 kg 116 kg 116.1 kg   Body mass index is 42.59 kg/m.  Exam:  General: 75 y.o. year-old male well developed well nourished in no acute distress.  Alert and oriented x3.  Obese in mild distress from his abdominal pain Cardiovascular: Regular rate and rhythm with no rubs or gallops.  No thyromegaly or JVD noted.   Respiratory: Clear to auscultation with no wheezes or rales. Good inspiratory effort. Abdomen: Firm slightly distended and tender with normal bowel sounds x4 quadrants. Musculoskeletal: No lower extremity edema. 2/4 pulses in all 4 extremities. Skin: No ulcerative lesions noted or rashes, Psychiatry: Mood is appropriate for condition and setting Neurology: Constant smacking of his lips    Data Reviewed: CBC: Recent Labs  Lab 04/15/2021 0213 05/03/21 0716 05/05/21 0555 05/06/21 0530 05/07/21 0551  WBC 11.6*  11.2* 7.5  --  8.9  NEUTROABS 9.8*  --   --   --   --   HGB 9.7* 10.1* 8.7* 9.2* 9.0*  HCT 29.8* 32.0* 27.9* 29.7* 29.8*  MCV 87.6 87.9 90.3  --  91.4  PLT 332 302 291  --  462   Basic Metabolic Panel: Recent Labs  Lab 05/03/21 0716 05/04/21 0610 05/05/21 0555 05/06/21 0530 05/07/21 0551  NA 136 136 138 138 137  K 4.6 4.4 4.2 4.6 4.6  CL 99 99 100 101 98  CO2 26 31 30 29  32  GLUCOSE 223* 218* 132* 193* 216*  BUN 67* 72* 69* 70* 73*  CREATININE 3.53* 3.72* 3.55* 3.65* 3.44*  CALCIUM 8.0* 7.9* 8.0* 8.2* 8.3*  MG  --   --  2.1  --  2.2  PHOS  --   --  5.4*  --  4.3   GFR: Estimated Creatinine Clearance: 22.5 mL/min (A) (by C-G formula based on SCr of 3.44 mg/dL (H)). Liver Function Tests: No results for input(s): AST, ALT, ALKPHOS, BILITOT, PROT, ALBUMIN in the last 168  hours. No results for input(s): LIPASE, AMYLASE in the last 168 hours. No results for input(s): AMMONIA in the last 168 hours. Coagulation Profile: Recent Labs  Lab 04/16/2021 0447  INR 1.2   Cardiac Enzymes: No results for input(s): CKTOTAL, CKMB, CKMBINDEX, TROPONINI in the last 168 hours. BNP (last 3 results) No results for input(s): PROBNP in the last 8760 hours. HbA1C: No results for input(s): HGBA1C in the last 72 hours. CBG: Recent Labs  Lab 05/06/21 0811 05/06/21 1156 05/06/21 1541 05/06/21 2115 05/07/21 0753  GLUCAP 172* 188* 273* 239* 206*   Lipid Profile: No results for input(s): CHOL, HDL, LDLCALC, TRIG, CHOLHDL, LDLDIRECT in the last 72 hours. Thyroid Function Tests: No results for input(s): TSH, T4TOTAL, FREET4, T3FREE, THYROIDAB in the last 72 hours. Anemia Panel: No results for input(s): VITAMINB12, FOLATE, FERRITIN, TIBC, IRON, RETICCTPCT in the last 72 hours. Urine analysis:    Component Value Date/Time   COLORURINE YELLOW (A) 04/05/2021 0651   APPEARANCEUR HAZY (A) 04/30/2021 0651   LABSPEC 1.014 04/02/2021 0651   PHURINE 5.0 04/19/2021 0651   GLUCOSEU 50 (A) 04/03/2021  0651   HGBUR NEGATIVE 04/12/2021 0651   BILIRUBINUR NEGATIVE 04/15/2021 0651   BILIRUBINUR neg 12/06/2010 1627   KETONESUR NEGATIVE 04/28/2021 0651   PROTEINUR 100 (A) 04/19/2021 0651   UROBILINOGEN 0.2 12/06/2010 1627   NITRITE NEGATIVE 04/04/2021 0651   LEUKOCYTESUR NEGATIVE 04/27/2021 0651   Sepsis Labs: @LABRCNTIP (procalcitonin:4,lacticidven:4)  ) Recent Results (from the past 240 hour(s))  Resp Panel by RT-PCR (Flu A&B, Covid)     Status: None   Collection Time: 04/07/2021 10:46 AM  Result Value Ref Range Status   SARS Coronavirus 2 by RT PCR NEGATIVE NEGATIVE Final    Comment: (NOTE) SARS-CoV-2 target nucleic acids are NOT DETECTED.  The SARS-CoV-2 RNA is generally detectable in upper respiratory specimens during the acute phase of infection. The lowest concentration of SARS-CoV-2 viral copies this assay can detect is 138 copies/mL. A negative result does not preclude SARS-Cov-2 infection and should not be used as the sole basis for treatment or other patient management decisions. A negative result may occur with  improper specimen collection/handling, submission of specimen other than nasopharyngeal swab, presence of viral mutation(s) within the areas targeted by this assay, and inadequate number of viral copies(<138 copies/mL). A negative result must be combined with clinical observations, patient history, and epidemiological information. The expected result is Negative.  Fact Sheet for Patients:  EntrepreneurPulse.com.au  Fact Sheet for Healthcare Providers:  IncredibleEmployment.be  This test is no t yet approved or cleared by the [place] FDA and  has been authorized for detection and/or diagnosis of SARS-CoV-2 by FDA under an Emergency Use Authorization (EUA). This EUA will remain  in effect (meaning this test can be used) for the duration of the COVID-19 declaration under Section 564(b)(1) of the Act, 21 U.S.C.section  360bbb-3(b)(1), unless the authorization is terminated  or revoked sooner.       Influenza A by PCR NEGATIVE NEGATIVE Final   Influenza B by PCR NEGATIVE NEGATIVE Final    Comment: (NOTE) The Xpert Xpress SARS-CoV-2/FLU/RSV plus assay is intended as an aid in the diagnosis of influenza from Nasopharyngeal swab specimens and should not be used as a sole basis for treatment. Nasal washings and aspirates are unacceptable for Xpert Xpress SARS-CoV-2/FLU/RSV testing.  Fact Sheet for Patients: EntrepreneurPulse.com.au  Fact Sheet for Healthcare Providers: IncredibleEmployment.be  This test is not yet approved or cleared by the [place] FDA and has  been authorized for detection and/or diagnosis of SARS-CoV-2 by FDA under an Emergency Use Authorization (EUA). This EUA will remain in effect (meaning this test can be used) for the duration of the COVID-19 declaration under Section 564(b)(1) of the Act, 21 U.S.C. section 360bbb-3(b)(1), unless the authorization is terminated or revoked.  Performed at [facility], [address], [place], [place] [address]       Studies: No results found.  Scheduled Meds:  aspirin EC  81 mg Oral Daily   atorvastatin  80 mg Oral QHS   bisacodyl  10 mg Rectal Once   calcitRIOL  0.25 mcg Oral Daily   carvedilol  25 mg Oral BID WC   cloNIDine  0.1 mg Oral TID   clopidogrel  75 mg Oral Daily   dorzolamide-timolol  1 drop Both Eyes BID   enoxaparin (LOVENOX) injection  30 mg Subcutaneous Q24H   ezetimibe  10 mg Oral Daily   feeding supplement  237 mL Oral BID BM   fluticasone furoate-vilanterol  1 puff Inhalation Daily   And   umeclidinium bromide  1 puff Inhalation Daily   gabapentin  100 mg Oral BID   hydrALAZINE  25 mg Oral TID   insulin aspart  0-20 Units Subcutaneous TID WC   insulin aspart  0-5 Units Subcutaneous QHS   insulin detemir  30 Units Subcutaneous QHS   isosorbide mononitrate   120 mg Oral Daily   levothyroxine  50 mcg Oral Q0600   montelukast  10 mg Oral Daily   pantoprazole  40 mg Oral Daily   sodium chloride flush  3 mL Intravenous Q12H   tamsulosin  0.4 mg Oral QHS   cyanocobalamin  1,000 mcg Oral Daily    Continuous Infusions:  furosemide (LASIX) 200 mg in dextrose 5% 100 mL (2mg /mL) infusion Stopped (05/06/21 1645)     LOS: 4 days     Cristal Deer, MD [facility]  To reach me or the doctor on call, go to: [email or web address] Password [email or web address]  05/07/2021, 8:16 AM

## 2021-05-08 ENCOUNTER — Inpatient Hospital Stay: Payer: Medicare HMO

## 2021-05-08 DIAGNOSIS — I5033 Acute on chronic diastolic (congestive) heart failure: Secondary | ICD-10-CM | POA: Diagnosis not present

## 2021-05-08 DIAGNOSIS — J849 Interstitial pulmonary disease, unspecified: Secondary | ICD-10-CM | POA: Diagnosis not present

## 2021-05-08 DIAGNOSIS — I214 Non-ST elevation (NSTEMI) myocardial infarction: Secondary | ICD-10-CM | POA: Diagnosis not present

## 2021-05-08 LAB — CBC
HCT: 28.9 % — ABNORMAL LOW (ref 39.0–52.0)
Hemoglobin: 9 g/dL — ABNORMAL LOW (ref 13.0–17.0)
MCH: 28 pg (ref 26.0–34.0)
MCHC: 31.1 g/dL (ref 30.0–36.0)
MCV: 90 fL (ref 80.0–100.0)
Platelets: 254 10*3/uL (ref 150–400)
RBC: 3.21 MIL/uL — ABNORMAL LOW (ref 4.22–5.81)
RDW: 15.6 % — ABNORMAL HIGH (ref 11.5–15.5)
WBC: 5.4 10*3/uL (ref 4.0–10.5)
nRBC: 0 % (ref 0.0–0.2)

## 2021-05-08 LAB — BASIC METABOLIC PANEL
Anion gap: 7 (ref 5–15)
BUN: 72 mg/dL — ABNORMAL HIGH (ref 8–23)
CO2: 30 mmol/L (ref 22–32)
Calcium: 8.2 mg/dL — ABNORMAL LOW (ref 8.9–10.3)
Chloride: 100 mmol/L (ref 98–111)
Creatinine, Ser: 3.73 mg/dL — ABNORMAL HIGH (ref 0.61–1.24)
GFR, Estimated: 16 mL/min — ABNORMAL LOW (ref >60)
Glucose, Bld: 156 mg/dL — ABNORMAL HIGH (ref 70–99)
Potassium: 4.6 mmol/L (ref 3.5–5.1)
Sodium: 137 mmol/L (ref 135–145)

## 2021-05-08 LAB — RETICULOCYTES
Immature Retic Fract: 11.9 % (ref 2.3–15.9)
RBC.: 3.17 MIL/uL — ABNORMAL LOW (ref 4.22–5.81)
Retic Count, Absolute: 60.2 10*3/uL (ref 19.0–186.0)
Retic Ct Pct: 1.9 % (ref 0.4–3.1)

## 2021-05-08 LAB — GLUCOSE, CAPILLARY
Glucose-Capillary: 144 mg/dL — ABNORMAL HIGH (ref 70–99)
Glucose-Capillary: 175 mg/dL — ABNORMAL HIGH (ref 70–99)
Glucose-Capillary: 198 mg/dL — ABNORMAL HIGH (ref 70–99)
Glucose-Capillary: 174 mg/dL — ABNORMAL HIGH (ref 70–99)

## 2021-05-08 LAB — IRON AND TIBC
Iron: 17 ug/dL — ABNORMAL LOW (ref 45–182)
Saturation Ratios: 13 % — ABNORMAL LOW (ref 17.9–39.5)
TIBC: 133 ug/dL — ABNORMAL LOW (ref 250–450)
UIBC: 116 ug/dL

## 2021-05-08 LAB — VITAMIN B12: Vitamin B-12: 1904 pg/mL — ABNORMAL HIGH (ref 180–914)

## 2021-05-08 LAB — FOLATE: Folate: 13.9 ng/mL (ref >5.9)

## 2021-05-08 LAB — FERRITIN: Ferritin: 166 ng/mL (ref 24–336)

## 2021-05-08 MED ORDER — SODIUM CHLORIDE 0.9 % IV SOLN
510.0000 mg | Freq: Once | INTRAVENOUS | Status: AC
  Administered 2021-05-09: 510 mg via INTRAVENOUS
  Filled 2021-05-08: qty 17

## 2021-05-08 MED ORDER — SODIUM CHLORIDE 0.9 % IV SOLN
1.0000 g | INTRAVENOUS | Status: AC
  Administered 2021-05-08 – 2021-05-12 (×5): 1 g via INTRAVENOUS
  Filled 2021-05-08 (×3): qty 10
  Filled 2021-05-08: qty 1
  Filled 2021-05-08: qty 10

## 2021-05-08 MED ORDER — FUROSEMIDE 10 MG/ML IJ SOLN
80.0000 mg | Freq: Once | INTRAMUSCULAR | Status: AC
  Administered 2021-05-08: 80 mg via INTRAVENOUS

## 2021-05-08 MED ORDER — SODIUM CHLORIDE 0.9 % IV SOLN: INTRAVENOUS | Status: DC | PRN

## 2021-05-08 MED ORDER — EPOETIN ALFA 4000 UNIT/ML IJ SOLN
3000.0000 [IU] | INTRAMUSCULAR | Status: DC
  Administered 2021-05-09: 3000 [IU] via SUBCUTANEOUS
  Filled 2021-05-08 (×2): qty 1

## 2021-05-08 MED ORDER — CHLORHEXIDINE GLUCONATE CLOTH 2 % EX PADS
6.0000 | MEDICATED_PAD | Freq: Every day | CUTANEOUS | Status: DC
  Administered 2021-05-08 – 2021-05-19 (×12): 6 via TOPICAL

## 2021-05-08 NOTE — Progress Notes (Signed)
RN bladder scanned patient at 0130 due to no void. Bladder scan yielded 234ml. Will continue to monitor.

## 2021-05-08 NOTE — Progress Notes (Signed)
Progress Note  Patient Name: Stephen [name] Date of Encounter: 05/08/2021  [facility] Cardiologist: Ida Rogue, MD   Subjective   Complains of urinary retention, had to be bladder scan cath yesterday for relief.  Daughter at bedside.no other acute events overnight, patient appears weak, tired.  Inpatient Medications    Scheduled Meds:  aspirin EC  81 mg Oral Daily   atorvastatin  80 mg Oral QHS   calcitRIOL  0.25 mcg Oral Daily   carvedilol  25 mg Oral BID WC   Chlorhexidine Gluconate Cloth  6 each Topical Daily   cloNIDine  0.1 mg Oral TID   clopidogrel  75 mg Oral Daily   dorzolamide-timolol  1 drop Both Eyes BID   enoxaparin (LOVENOX) injection  30 mg Subcutaneous Q24H   ezetimibe  10 mg Oral Daily   feeding supplement  237 mL Oral BID BM   fluticasone furoate-vilanterol  1 puff Inhalation Daily   And   umeclidinium bromide  1 puff Inhalation Daily   gabapentin  100 mg Oral BID   hydrALAZINE  25 mg Oral TID   insulin aspart  0-20 Units Subcutaneous TID WC   insulin aspart  0-5 Units Subcutaneous QHS   insulin detemir  30 Units Subcutaneous QHS   isosorbide mononitrate  120 mg Oral Daily   levothyroxine  50 mcg Oral Q0600   montelukast  10 mg Oral Daily   pantoprazole  40 mg Oral Daily   sodium chloride flush  3 mL Intravenous Q12H   tamsulosin  0.4 mg Oral QHS   cyanocobalamin  1,000 mcg Oral Daily   Continuous Infusions:  sodium chloride 10 mL/hr at 05/08/21 0241   furosemide (LASIX) 200 mg in dextrose 5% 100 mL (2mg /mL) infusion Stopped (05/06/21 1645)   PRN Meds: sodium chloride, acetaminophen, albuterol, HYDROcodone-acetaminophen, nitroGLYCERIN, ondansetron (ZOFRAN) IV, polyethylene glycol   Vital Signs    Vitals:   05/08/21 0422 05/08/21 0500 05/08/21 0748 05/08/21 1115  BP:   (!) 136/56 (!) 138/54  Pulse: (!) 52  (!) 54 (!) 54  Resp:   20 18  Temp:   98.2 F (36.8 C) 97.6 F (36.4 C)  TempSrc:      SpO2: 97%  96% 94%  Weight:  108.2 kg     Height:        Intake/Output Summary (Last 24 hours) at 05/08/2021 1214 Last data filed at 05/08/2021 1000 Gross per 24 hour  Intake 555.86 ml  Output 1100 ml  Net -544.14 ml   Last 3 Weights 05/08/2021 05/07/2021 05/06/2021  Weight (lbs) 238 lb 8.6 oz 255 lb 15.3 oz 255 lb 11.7 oz  Weight (kg) 108.2 kg 116.1 kg 116 kg  Some encounter information is confidential and restricted. Go to Review Flowsheets activity to see all data.      Telemetry    Sinus bradycardia, heart rate 55- Personally Reviewed  ECG     - Personally Reviewed  Physical Exam   GEN: No acute distress.  Appears frail, tired, soft-spoken Neck: No JVD Cardiac: RRR, Respiratory: Diminished breath sounds at bases GI: Soft, nontender, non-distended  MS: 1+ edema; No deformity. Neuro:  Nonfocal  Psych: Normal affect  Labs    High Sensitivity Troponin:   Recent Labs  Lab 04/18/21 0505 04/18/21 0658 04/18/21 1240 04/09/2021 0213 04/07/2021 0344  TROPONINIHS 2,129* 1,997* 1,842* 1,417* 1,514*     Chemistry Recent Labs  Lab 05/05/21 0555 05/06/21 0530 05/07/21 0551 05/08/21 0801  NA 138  138 137 137  K 4.2 4.6 4.6 4.6  CL 100 101 98 100  CO2 30 29 32 30  GLUCOSE 132* 193* 216* 156*  BUN 69* 70* 73* 72*  CREATININE 3.55* 3.65* 3.44* 3.73*  CALCIUM 8.0* 8.2* 8.3* 8.2*  MG 2.1  --  2.2  --   GFRNONAA 17* 17* 18* 16*  ANIONGAP 8 8 7 7     Lipids No results for input(s): CHOL, TRIG, HDL, LABVLDL, LDLCALC, CHOLHDL in the last 168 hours.  Hematology Recent Labs  Lab 05/05/21 0555 05/06/21 0530 05/07/21 0551 05/08/21 0801 05/08/21 0928  WBC 7.5  --  8.9 5.4  --   RBC 3.09*  --  3.26* 3.21* 3.17*  HGB 8.7* 9.2* 9.0* 9.0*  --   HCT 27.9* 29.7* 29.8* 28.9*  --   MCV 90.3  --  91.4 90.0  --   MCH 28.2  --  27.6 28.0  --   MCHC 31.2  --  30.2 31.1  --   RDW 15.6*  --  15.6* 15.6*  --   PLT 291  --  286 254  --    Thyroid No results for input(s): TSH, FREET4 in the last 168 hours.  BNP Recent  Labs  Lab 04/12/2021 0214  BNP 1,205.9*    DDimer No results for input(s): DDIMER in the last 168 hours.   Radiology    No results found.  Cardiac Studies   04/18/2021  1. Left ventricular ejection fraction, by estimation, is 60 to 65%. The  left ventricle has normal function. Left ventricular endocardial border  not optimally defined to evaluate regional wall motion. The left  ventricular internal cavity size was mildly  dilated. There is mild left ventricular hypertrophy. Left ventricular  diastolic parameters are consistent with Grade I diastolic dysfunction  (impaired relaxation). Elevated left atrial pressure.   2. Right ventricular systolic function is normal. The right ventricular  size is not well visualized.   3. The mitral valve is abnormal. Trivial mitral valve regurgitation. Mild  mitral stenosis. The mean mitral valve gradient is 5.0 mmHg.   4. Tricuspid valve regurgitation not well assessed.   5. The aortic valve has an indeterminant number of cusps. There is mild  calcification of the aortic valve. There is moderate thickening of the  aortic valve. Aortic valve regurgitation is trivial. Mild to moderate  aortic valve sclerosis/calcification is  present, without any evidence of aortic stenosis.   Patient Profile     75 y.o. male with history of CAD, HFpEF, COPD, pulmonary fibrosis, respiratory failure on home oxygen, CKD 4, diabetes being seen for shortness of breath and HFpEF.  Assessment & Plan    HFpEF -1+ edema lower extremity -On Lasix drip.  Continue   2.  NSTEMI -S/p Heparin x48 hours -Recent left heart cath 08/2020 moderate RCA, LAD, CTO mid left circumflex. -Continue aspirin, Lipitor, Imdur, Coreg, Plavix. -No plans for further invasive testing   3.  COPD, pulmonary fibrosis -CPAP, oxygenation, inhalers as per pulmonary medicine -Long-term prognosis remains guarded  4.  Urinary retention -Bladder scan, urinary cath, management as per medicine  team.   Total encounter time 35 minutes  Greater than 50% was spent in counseling and coordination of care with the patient and daughter.     Signed, Kate Sable, MD  05/08/2021, 12:14 PM

## 2021-05-08 NOTE — Progress Notes (Addendum)
PROGRESS NOTE  Stephen [name] YTK:[id number] DOB: September 23, 1945 DOA: 04/25/2021 PCP: [name], MD  HPI/Recap of past 24 hours:  This 75 year old man who is readmitted with shortness of breath and acute on chronic diastolic congestive heart failure with pulmonary hypertension. The patient's creatinine has worsened with diuresis. Patient empirically placed on heparin drip.  Cardiology and Nephrology following. He is readmitted after a long hospital course.  Patient was treated medically for NSTEMI.  Patient has acute on chronic hypoxic hypercapnic respiratory failure on BiPAP at night.  Also has pulmonary hypertension and interstitial lung disease chronically on 4 L of oxygen.  Patient has acute on chronic diastolic congestive heart failure and creatinine is very sensitive to diuretics.  Patient was also treated for UTI during the hospital course and acute metabolic encephalopathy and received 7 days of Rocephin for E. coli and Serratia in the urine. He is started on doxycycline for erythema of the left antecubital fossa to cover staph aureus from recent IV site.  Patient also had a superficial thrombophlebitis left arm which was treated with conservative management  Subjective: May 07, 2021: Patient seen and examined at bedside with his daughter He was complaining of abdominal pain in the lower portion he has not been able to make urine. In-N-Out cath was done and it showed some obstruction that he has had a couple of In-N-Out catheterizations Nephrology was consulted and is seeing patient  May 08, 2021: Patient seen and examined at bedside with his daughter Olivia Mackie and her husband.  Overnight patient had another urinary retention with pain and discomfort that required catheterization.  He was already started on Flomax which is not helping he has a history of BPH.  Assessment/Plan: Active Problems:   Acute on chronic diastolic CHF (congestive heart failure) (HCC)   Elevated  troponin   CHF (congestive heart failure) (HCC)   Unstable angina (HCC)   Pulmonary hypertension (HCC)   Acute on chronic respiratory failure with hypoxia and hypercapnia (HCC)   CKD (chronic kidney disease), stage IV (HCC)   Interstitial lung disease (HCC)   B12 deficiency   Pressure injury of skin   Acute on chronic diastolic congestive heart failure Patient is on continuous IV Lasix drip Continue Coreg   Non-STEMI Patient presented with elevated troponin Completed heparin drip for 48 hours Continue aspirin Plavix Imdur Coreg and atorvastatin Cardiology is following Cardiology does not think LHC or possible PCI will improve patient's symptoms and is largely due to pulmonary fibrosis next  Acute on chronic hypoxic and hypercarbic respiratory failure Patient has a history of pulmonary hypertension and interstitial lung disease Continue BiPAP as needed and at night Continue supplemental oxygen of 4 to 6 L during daytime  Acute kidney injury on chronic kidney disease stage IV Serum creatinine is elevated continue with diuresis Nephrology is following He was started on Lexis drip for dependent edema We will continue to monitor his creatinine  Urinary retention.  Patient had a couple of Inderal in and out catheterization which did not resolve his retention he had up to 900 cc of residual urine that was removed Patient continues to have retention have ordered for Foley catheter placement  Type 2 diabetes mellitus continue Levemir and sliding scale insulin Obstructive sleep apnea continue CPAP at night  Code Status: DNR    Severity of Illness: The appropriate patient status for this patient is INPATIENT. Inpatient status is judged to be reasonable and necessary in order to provide the required intensity of service  to ensure the patient's safety. The patient's presenting symptoms, physical exam findings, and initial radiographic and laboratory data in the context of their chronic  comorbidities is felt to place them at high risk for further clinical deterioration. Furthermore, it is not anticipated that the patient will be medically stable for discharge from the hospital within 2 midnights of admission.   * I certify that at the point of admission it is my clinical judgment that the patient will require inpatient hospital care spanning beyond 2 midnights from the point of admission due to high intensity of service, high risk for further deterioration and high frequency of surveillance required.*   Family Communication: Daughter Stacy at bedside  Disposition Plan:    Status is: Inpatient   Dispo: The patient is from: Home              Anticipated d/c is to:               Anticipated d/c date is:               Patient currently not medically stable for discharge  Consultants: Cardiology Nephrology Palliative care  Procedures: None  Antimicrobials: Doxycycline    DVT prophylaxis:      Objective: Vitals:   05/08/21 0500 05/08/21 0748 05/08/21 1115 05/08/21 1504  BP:  (!) 136/56 (!) 138/54 (!) 146/63  Pulse:  (!) 54 (!) 54 (!) 56  Resp:  20 18 16   Temp:  98.2 F (36.8 C) 97.6 F (36.4 C) 98 F (36.7 C)  TempSrc:      SpO2:  96% 94% 97%  Weight: 108.2 kg     Height:        Intake/Output Summary (Last 24 hours) at 05/08/2021 1755 Last data filed at 05/08/2021 1500 Gross per 24 hour  Intake 315.86 ml  Output 1350 ml  Net -1034.14 ml    Filed Weights   05/06/21 0426 05/07/21 0500 05/08/21 0500  Weight: 116 kg 116.1 kg 108.2 kg   Body mass index is 39.69 kg/m.  Exam:  General: 75 y.o. year-old male well developed well nourished in no acute distress.  Alert and oriented x3.  Obese in mild distress from his abdominal pain Cardiovascular: Regular rate and rhythm with no rubs or gallops.  No thyromegaly or JVD noted.   Respiratory: Clear to auscultation with no wheezes or rales. Good inspiratory effort. Abdomen: Firm slightly distended and  tender with normal bowel sounds x4 quadrants. Musculoskeletal: No lower extremity edema. 2/4 pulses in all 4 extremities. Skin: No ulcerative lesions noted or rashes, Psychiatry: Mood is appropriate for condition and setting Neurology: Constant smacking of his lips    Data Reviewed: CBC: Recent Labs  Lab 04/09/2021 0213 05/03/21 0716 05/05/21 0555 05/06/21 0530 05/07/21 0551 05/08/21 0801  WBC 11.6* 11.2* 7.5  --  8.9 5.4  NEUTROABS 9.8*  --   --   --   --   --   HGB 9.7* 10.1* 8.7* 9.2* 9.0* 9.0*  HCT 29.8* 32.0* 27.9* 29.7* 29.8* 28.9*  MCV 87.6 87.9 90.3  --  91.4 90.0  PLT 332 302 291  --  286 379    Basic Metabolic Panel: Recent Labs  Lab 05/04/21 0610 05/05/21 0555 05/06/21 0530 05/07/21 0551 05/08/21 0801  NA 136 138 138 137 137  K 4.4 4.2 4.6 4.6 4.6  CL 99 100 101 98 100  CO2 31 30 29  32 30  GLUCOSE 218* 132* 193* 216* 156*  BUN 72*  69* 70* 73* 72*  CREATININE 3.72* 3.55* 3.65* 3.44* 3.73*  CALCIUM 7.9* 8.0* 8.2* 8.3* 8.2*  MG  --  2.1  --  2.2  --   PHOS  --  5.4*  --  4.3  --     GFR: Estimated Creatinine Clearance: 20 mL/min (A) (by C-G formula based on SCr of 3.73 mg/dL (H)). Liver Function Tests: No results for input(s): AST, ALT, ALKPHOS, BILITOT, PROT, ALBUMIN in the last 168 hours. No results for input(s): LIPASE, AMYLASE in the last 168 hours. No results for input(s): AMMONIA in the last 168 hours. Coagulation Profile: Recent Labs  Lab 04/10/2021 0447  INR 1.2    Cardiac Enzymes: No results for input(s): CKTOTAL, CKMB, CKMBINDEX, TROPONINI in the last 168 hours. BNP (last 3 results) No results for input(s): PROBNP in the last 8760 hours. HbA1C: No results for input(s): HGBA1C in the last 72 hours. CBG: Recent Labs  Lab 05/07/21 1637 05/07/21 2138 05/08/21 0747 05/08/21 1115 05/08/21 1650  GLUCAP 223* 201* 144* 175* 198*    Lipid Profile: No results for input(s): CHOL, HDL, LDLCALC, TRIG, CHOLHDL, LDLDIRECT in the last 72  hours. Thyroid Function Tests: No results for input(s): TSH, T4TOTAL, FREET4, T3FREE, THYROIDAB in the last 72 hours. Anemia Panel: Recent Labs    05/08/21 0928  VITAMINB12 1,904*  FOLATE 13.9  FERRITIN 166  TIBC 133*  IRON 17*  RETICCTPCT 1.9   Urine analysis:    Component Value Date/Time   COLORURINE YELLOW (A) 04/14/2021 0651   APPEARANCEUR HAZY (A) 04/30/2021 0651   LABSPEC 1.014 04/10/2021 0651   PHURINE 5.0 04/21/2021 0651   GLUCOSEU 50 (A) 04/08/2021 0651   HGBUR NEGATIVE 04/21/2021 0651   BILIRUBINUR NEGATIVE 05/01/2021 0651   BILIRUBINUR neg 12/06/2010 1627   KETONESUR NEGATIVE 04/30/2021 0651   PROTEINUR 100 (A) 04/02/2021 0651   UROBILINOGEN 0.2 12/06/2010 1627   NITRITE NEGATIVE 04/02/2021 0651   LEUKOCYTESUR NEGATIVE 04/07/2021 0651   Sepsis Labs: @LABRCNTIP (procalcitonin:4,lacticidven:4)  ) Recent Results (from the past 240 hour(s))  Resp Panel by RT-PCR (Flu A&B, Covid)     Status: None   Collection Time: 04/17/2021 10:46 AM  Result Value Ref Range Status   SARS Coronavirus 2 by RT PCR NEGATIVE NEGATIVE Final    Comment: (NOTE) SARS-CoV-2 target nucleic acids are NOT DETECTED.  The SARS-CoV-2 RNA is generally detectable in upper respiratory specimens during the acute phase of infection. The lowest concentration of SARS-CoV-2 viral copies this assay can detect is 138 copies/mL. A negative result does not preclude SARS-Cov-2 infection and should not be used as the sole basis for treatment or other patient management decisions. A negative result may occur with  improper specimen collection/handling, submission of specimen other than nasopharyngeal swab, presence of viral mutation(s) within the areas targeted by this assay, and inadequate number of viral copies(<138 copies/mL). A negative result must be combined with clinical observations, patient history, and epidemiological information. The expected result is Negative.  Fact Sheet for Patients:   EntrepreneurPulse.com.au  Fact Sheet for Healthcare Providers:  IncredibleEmployment.be  This test is no t yet approved or cleared by the [place] FDA and  has been authorized for detection and/or diagnosis of SARS-CoV-2 by FDA under an Emergency Use Authorization (EUA). This EUA will remain  in effect (meaning this test can be used) for the duration of the COVID-19 declaration under Section 564(b)(1) of the Act, 21 U.S.C.section 360bbb-3(b)(1), unless the authorization is terminated  or revoked sooner.  Influenza A by PCR NEGATIVE NEGATIVE Final   Influenza B by PCR NEGATIVE NEGATIVE Final    Comment: (NOTE) The Xpert Xpress SARS-CoV-2/FLU/RSV plus assay is intended as an aid in the diagnosis of influenza from Nasopharyngeal swab specimens and should not be used as a sole basis for treatment. Nasal washings and aspirates are unacceptable for Xpert Xpress SARS-CoV-2/FLU/RSV testing.  Fact Sheet for Patients: EntrepreneurPulse.com.au  Fact Sheet for Healthcare Providers: IncredibleEmployment.be  This test is not yet approved or cleared by the [place] FDA and has been authorized for detection and/or diagnosis of SARS-CoV-2 by FDA under an Emergency Use Authorization (EUA). This EUA will remain in effect (meaning this test can be used) for the duration of the COVID-19 declaration under Section 564(b)(1) of the Act, 21 U.S.C. section 360bbb-3(b)(1), unless the authorization is terminated or revoked.  Performed at [facility], [address] [place], [place] [address]       Studies: [facility] Chest [place] 1 View  Result Date: 05/08/2021 CLINICAL DATA:  Shortness of breath. EXAM: PORTABLE CHEST 1 VIEW COMPARISON:  04/08/2021.  Chest CT 04/05/2021 FINDINGS: Low lung volumes. Prominent heart size likely accentuated by technique and low lung volumes. Questionable retrocardiac opacity  versus superimposition of soft tissue structures, evaluation is limited by soft tissue attenuation from habitus. Regardless, suspected left pleural effusion there is patchy airspace disease in both perihilar lungs. No pneumothorax. IMPRESSION: 1. Patchy airspace disease in both perihilar lungs, pneumonia or pulmonary edema. 2. Questionable retrocardiac opacity versus superimposition of soft tissue structures. Regardless, suspected left pleural effusion. Electronically Signed   By: Keith Rake M.D.   On: 05/08/2021 17:14    Scheduled Meds:  aspirin EC  81 mg Oral Daily   atorvastatin  80 mg Oral QHS   calcitRIOL  0.25 mcg Oral Daily   carvedilol  25 mg Oral BID WC   Chlorhexidine Gluconate Cloth  6 each Topical Daily   cloNIDine  0.1 mg Oral TID   clopidogrel  75 mg Oral Daily   dorzolamide-timolol  1 drop Both Eyes BID   enoxaparin (LOVENOX) injection  30 mg Subcutaneous Q24H   [START ON 05/09/2021] epoetin (EPOGEN/PROCRIT) injection  3,000 Units Subcutaneous Q14 Days   ezetimibe  10 mg Oral Daily   feeding supplement  237 mL Oral BID BM   fluticasone furoate-vilanterol  1 puff Inhalation Daily   And   umeclidinium bromide  1 puff Inhalation Daily   furosemide  80 mg Intravenous Once   gabapentin  100 mg Oral BID   hydrALAZINE  25 mg Oral TID   insulin aspart  0-20 Units Subcutaneous TID WC   insulin aspart  0-5 Units Subcutaneous QHS   insulin detemir  30 Units Subcutaneous QHS   isosorbide mononitrate  120 mg Oral Daily   levothyroxine  50 mcg Oral Q0600   montelukast  10 mg Oral Daily   pantoprazole  40 mg Oral Daily   sodium chloride flush  3 mL Intravenous Q12H   tamsulosin  0.4 mg Oral QHS   cyanocobalamin  1,000 mcg Oral Daily    Continuous Infusions:  sodium chloride 10 mL/hr at 05/08/21 0241   [START ON 05/09/2021] ferumoxytol     furosemide (LASIX) 200 mg in dextrose 5% 100 mL (2mg /mL) infusion 8 mg/hr (05/08/21 1608)     LOS: 5 days     Cristal Deer,  MD [facility]  To reach me or the doctor on call, go to: [email or web address] Password [email or web address]  05/08/2021, 5:55 PM

## 2021-05-08 NOTE — Progress Notes (Signed)
Patient in and out cathed after bladder scan yielded 423ml. In and out cath results was 6110ml of frank hematuria. Sharion Settler, NP notified via secure chat. No new orders.

## 2021-05-08 NOTE — Progress Notes (Addendum)
[facility] [place], [place] 05/08/21  Subjective:   LOS: 5 11/05 0701 - 11/06 0700 In: 315.9 [P.O.:300; I.V.:15.9] Out: 2000 [Urine:2000] Patient known to our practice from outpatient follow-up.  He is followed by Dr. Cherylann Ratel and was last seen on August 9. He has medical problems of longstanding diabetes, hypertension, history of nephrolithiasis, hyperlipidemia, coronary artery disease status post PTCA, pulmonary fibrosis on 4 L chronic oxygen, obstructive sleep apnea, history of right adrenalectomy, left renal artery stenosis status post stent, pulmonary hypertension  Patient appears to be resting comfortably, daughter at bedside States patient has shortness of breath this a.m. and received breathing treatment.  Appetite remains poor, drinking Ensure between meals.  Patient seen later in the morning on BiPAP.  Patient main complaint in today visit was that he was having problems emptying his bladder. On further discussion patient informed me that they had to put a Foley this morning. I then discussed this with the patient's care team and was informed the patient required Foley cath patient this morning and had 900 mL of urine output after catheter was placed   Objective:  Vital signs in last 24 hours:  Temp:  [97.6 F (36.4 C)-99.1 F (37.3 C)] 98 F (36.7 C) (11/06 1504) Pulse Rate:  [52-62] 56 (11/06 1504) Resp:  [16-20] 16 (11/06 1504) BP: (124-164)/(50-71) 146/63 (11/06 1504) SpO2:  [94 %-99 %] 97 % (11/06 1504) Weight:  [108.2 kg] 108.2 kg (11/06 0500)  Weight change: -7.9 kg Filed Weights   05/06/21 0426 05/07/21 0500 05/08/21 0500  Weight: 116 kg 116.1 kg 108.2 kg    Intake/Output:    Intake/Output Summary (Last 24 hours) at 05/08/2021 1623 Last data filed at 05/08/2021 1500 Gross per 24 hour  Intake 315.86 ml  Output 1350 ml  Net -1034.14 ml     Physical Exam: General: No acute distress, laying in the bed  HEENT Moist oral mucous  membranes  Pulm/lungs Bibasilar crackles, 4 L O2 [place]  CVS/Heart Holosystolic murmur, no rub  Abdomen:  Soft, obese, nontender  Extremities: 2+ pitting edema bilaterally  Neurologic: Alert, oriented  Skin: Warm, dry          Basic Metabolic Panel:  Recent Labs  Lab 05/04/21 0610 05/05/21 0555 05/06/21 0530 05/07/21 0551 05/08/21 0801  NA 136 138 138 137 137  K 4.4 4.2 4.6 4.6 4.6  CL 99 100 101 98 100  CO2 31 30 29  32 30  GLUCOSE 218* 132* 193* 216* 156*  BUN 72* 69* 70* 73* 72*  CREATININE 3.72* 3.55* 3.65* 3.44* 3.73*  CALCIUM 7.9* 8.0* 8.2* 8.3* 8.2*  MG  --  2.1  --  2.2  --   PHOS  --  5.4*  --  4.3  --      CBC: Recent Labs  Lab May 15, 2021 0213 05/03/21 0716 05/05/21 0555 05/06/21 0530 05/07/21 0551 05/08/21 0801  WBC 11.6* 11.2* 7.5  --  8.9 5.4  NEUTROABS 9.8*  --   --   --   --   --   HGB 9.7* 10.1* 8.7* 9.2* 9.0* 9.0*  HCT 29.8* 32.0* 27.9* 29.7* 29.8* 28.9*  MCV 87.6 87.9 90.3  --  91.4 90.0  PLT 332 302 291  --  286 254     No results found for: HEPBSAG, HEPBSAB, HEPBIGM    Microbiology:  Recent Results (from the past 240 hour(s))  Resp Panel by RT-PCR (Flu A&B, Covid)     Status: None   Collection Time:  21-May-2021 10:46 AM  Result Value Ref Range Status   SARS Coronavirus 2 by RT PCR NEGATIVE NEGATIVE Final    Comment: (NOTE) SARS-CoV-2 target nucleic acids are NOT DETECTED.  The SARS-CoV-2 RNA is generally detectable in upper respiratory specimens during the acute phase of infection. The lowest concentration of SARS-CoV-2 viral copies this assay can detect is 138 copies/mL. A negative result does not preclude SARS-Cov-2 infection and should not be used as the sole basis for treatment or other patient management decisions. A negative result may occur with  improper specimen collection/handling, submission of specimen other than nasopharyngeal swab, presence of viral mutation(s) within the areas targeted by this assay, and inadequate  number of viral copies(<138 copies/mL). A negative result must be combined with clinical observations, patient history, and epidemiological information. The expected result is Negative.  Fact Sheet for Patients:  BloggerCourse.com  Fact Sheet for Healthcare Providers:  SeriousBroker.it  This test is no t yet approved or cleared by the [place] FDA and  has been authorized for detection and/or diagnosis of SARS-CoV-2 by FDA under an Emergency Use Authorization (EUA). This EUA will remain  in effect (meaning this test can be used) for the duration of the COVID-19 declaration under Section 564(b)(1) of the Act, 21 U.S.C.section 360bbb-3(b)(1), unless the authorization is terminated  or revoked sooner.       Influenza A by PCR NEGATIVE NEGATIVE Final   Influenza B by PCR NEGATIVE NEGATIVE Final    Comment: (NOTE) The Xpert Xpress SARS-CoV-2/FLU/RSV plus assay is intended as an aid in the diagnosis of influenza from Nasopharyngeal swab specimens and should not be used as a sole basis for treatment. Nasal washings and aspirates are unacceptable for Xpert Xpress SARS-CoV-2/FLU/RSV testing.  Fact Sheet for Patients: BloggerCourse.com  Fact Sheet for Healthcare Providers: SeriousBroker.it  This test is not yet approved or cleared by the [place] FDA and has been authorized for detection and/or diagnosis of SARS-CoV-2 by FDA under an Emergency Use Authorization (EUA). This EUA will remain in effect (meaning this test can be used) for the duration of the COVID-19 declaration under Section 564(b)(1) of the Act, 21 U.S.C. section 360bbb-3(b)(1), unless the authorization is terminated or revoked.  Performed at [facility], [address] [place], [place] [address]     Coagulation Studies: No results for input(s): LABPROT, INR in the last 72  hours.   Urinalysis: No results for input(s): COLORURINE, LABSPEC, PHURINE, GLUCOSEU, HGBUR, BILIRUBINUR, KETONESUR, PROTEINUR, UROBILINOGEN, NITRITE, LEUKOCYTESUR in the last 72 hours.  Invalid input(s): APPERANCEUR     Imaging: No results found.   Medications:    sodium chloride 10 mL/hr at 05/08/21 0241   furosemide (LASIX) 200 mg in dextrose 5% 100 mL (2mg /mL) infusion 8 mg/hr (05/08/21 1608)     aspirin EC  81 mg Oral Daily   atorvastatin  80 mg Oral QHS   calcitRIOL  0.25 mcg Oral Daily   carvedilol  25 mg Oral BID WC   Chlorhexidine Gluconate Cloth  6 each Topical Daily   cloNIDine  0.1 mg Oral TID   clopidogrel  75 mg Oral Daily   dorzolamide-timolol  1 drop Both Eyes BID   enoxaparin (LOVENOX) injection  30 mg Subcutaneous Q24H   ezetimibe  10 mg Oral Daily   feeding supplement  237 mL Oral BID BM   fluticasone furoate-vilanterol  1 puff Inhalation Daily   And   umeclidinium bromide  1 puff Inhalation Daily   furosemide  80  mg Intravenous Once   gabapentin  100 mg Oral BID   hydrALAZINE  25 mg Oral TID   insulin aspart  0-20 Units Subcutaneous TID WC   insulin aspart  0-5 Units Subcutaneous QHS   insulin detemir  30 Units Subcutaneous QHS   isosorbide mononitrate  120 mg Oral Daily   levothyroxine  50 mcg Oral Q0600   montelukast  10 mg Oral Daily   pantoprazole  40 mg Oral Daily   sodium chloride flush  3 mL Intravenous Q12H   tamsulosin  0.4 mg Oral QHS   cyanocobalamin  1,000 mcg Oral Daily   sodium chloride, acetaminophen, albuterol, HYDROcodone-acetaminophen, nitroGLYCERIN, ondansetron (ZOFRAN) IV, polyethylene glycol  Assessment/ Plan:  75 y.o. male with medical problems of longstanding diabetes, hypertension, history of nephrolithiasis, hyperlipidemia, coronary artery disease status post PTCA, pulmonary fibrosis on 4 L chronic oxygen, obstructive sleep apnea, history of right adrenalectomy, left renal artery stenosis status post stent, pulmonary  hypertension   Active Problems:   Acute on chronic diastolic CHF (congestive heart failure) (HCC)   Elevated troponin   CHF (congestive heart failure) (HCC)   Unstable angina (HCC)   Pulmonary hypertension (HCC)   Acute on chronic respiratory failure with hypoxia and hypercapnia (HCC)   CKD (chronic kidney disease), stage IV (HCC)   Interstitial lung disease (HCC)   B12 deficiency   Pressure injury of skin    Impression  1)Renal   Acute kidney injury Patient has AKI secondary to ATN Patient has AKI secondary to recent hemodynamically instability Patient currently admitted with the non-ST elevation MI and acute on chronic diastolic CHF Patient AKI possibly has a component of obstruction as well as patient was having incomplete bladder emptying.  Patient has AKI on CKD Patient has CKD stage IV Patient has CKD going back to 2013 Patient has CKD secondary to diabetes mellitus There Is a possible contribution from hypertension. Patient CKD has been marked with multiple episode of AKI's   Patient AKI is currently stable Patient creatinine is today at 3.4--3.7 No need for renal replacement therapy today Will follow   2)HTN    Blood pressure is stable    3)Anemia of chronic disease  CBC Latest Ref Rng & Units 05/08/2021 05/07/2021 05/06/2021  WBC 4.0 - 10.5 K/uL 5.4 8.9 -  Hemoglobin 13.0 - 17.0 g/dL 9.0(L) 9.0(L) 9.2(L)  Hematocrit 39.0 - 52.0 % 28.9(L) 29.8(L) 29.7(L)  Platelets 150 - 400 K/uL 254 286 -       HGb at goal (9--11) Will ask for anemia profile Patient iron saturation done on October 22 showed patient had iron deficiency anemia    Reviewed patient's lab data Results for Stephen [name], Stephen [name] (MRN [id number]) as of 05/08/2021 16:23  Ref. Range 05/08/2021 09:28  Iron Latest Ref Range: 45 - 182 ug/dL 17 (L)  UIBC Latest Units: ug/dL 409  TIBC Latest Ref Range: 250 - 450 ug/dL 811 (L)  Saturation Ratios Latest Ref Range: 17.9 - 39.5 % 13 (L)  Ferritin  Latest Ref Range: 24 - 336 ng/mL 166  Folate Latest Ref Range: >5.9 ng/mL 13.9   Patient iron saturation is low Patient TIBC is low as well   Patient has iron deficiency anemia And anemia of chronic disease We will give patient IV iron We will discuss with the primary team regarding need for GI work-up We will start patient on Epogen as well  4) Secondary hyperparathyroidism -CKD Mineral-Bone Disorder    Lab Results  Component Value Date  CALCIUM 8.2 (L) 05/08/2021   CAION 1.18 08/31/2010   PHOS 4.3 05/07/2021    Secondary Hyperparathyroidism  present  Patient has history of secondary hyperparathyroidism-patient intact PTH was 182 as an outpatient  Phosphorus at goal.   5)Acute on chronic diastolic CHF Patient is currently on Lasix drip at 4 mg/h Patient is currently negative by approximately 4 L   6) Electrolytes   BMP Latest Ref Rng & Units 05/08/2021 05/07/2021 05/06/2021  Glucose 70 - 99 mg/dL 161(W) 960(A) 540(J)  BUN 8 - 23 mg/dL 81(X) 91(Y) 78(G)  Creatinine 0.61 - 1.24 mg/dL 9.56(O) 1.30(Q) 6.57(Q)  BUN/Creat Ratio 10 - 24 - - -  Sodium 135 - 145 mmol/L 137 137 138  Potassium 3.5 - 5.1 mmol/L 4.6 4.6 4.6  Chloride 98 - 111 mmol/L 100 98 101  CO2 22 - 32 mmol/L 30 32 29  Calcium 8.9 - 10.3 mg/dL 8.2(L) 8.3(L) 8.2(L)     Sodium Normonatremic   Potassium Normokalemic    7)Acid base    Co2 at goal  8) Acute on chronic hypoxic and hypercapnic respiratory failure Patient has history of pulm hypertension and interstitial lung disease Patient is currently on BiPAP as needed This is being closely followed by the primary team  9) NSTEMI Primary team and cardiology is following Neurology has deferred catheterization for now  10)Hematuria Patient has required multiple catheterization Patient currently has hematuria Will discuss with the primary team about need of inpatient urology consult  Plan   Will ask for anemia work-up We will continue  the current diuretics Will discuss with primary team about need for urology consult   Addendum    I was alerted by nurse/the primary team that patient is having decreased urine output I then went to examine the patient again Patient informed me that his groin tenderness is better since patient catheter has been flushed by the nurse Patient has had only 200 mL urine output in past 3 hours On further questioning patient Lasix drip ran out around 45 minutes ago. I then discussed with the patient, the primary team and the nurse that I will give patient a bolus of Lasix We will increase the dose of Lasix drip From 4 mg to 8 mg/h We will ask for renal ultrasound Depending upon the clinical course patient may need renal replacement therapy in case he is unresponsive to diuretics   We will start patient on IV iron We will start patient on Epogen     LOS: 5 Ann-Marie Kluge s [facility] 11/6/20224:23 PM  [facility] [place], [place] [phone]

## 2021-05-09 DIAGNOSIS — I214 Non-ST elevation (NSTEMI) myocardial infarction: Secondary | ICD-10-CM | POA: Diagnosis not present

## 2021-05-09 DIAGNOSIS — R52 Pain, unspecified: Secondary | ICD-10-CM

## 2021-05-09 DIAGNOSIS — R0789 Other chest pain: Secondary | ICD-10-CM | POA: Diagnosis not present

## 2021-05-09 DIAGNOSIS — R338 Other retention of urine: Secondary | ICD-10-CM | POA: Diagnosis not present

## 2021-05-09 DIAGNOSIS — I5033 Acute on chronic diastolic (congestive) heart failure: Secondary | ICD-10-CM | POA: Diagnosis not present

## 2021-05-09 LAB — BASIC METABOLIC PANEL
Anion gap: 7 (ref 5–15)
BUN: 80 mg/dL — ABNORMAL HIGH (ref 8–23)
CO2: 31 mmol/L (ref 22–32)
Calcium: 8.3 mg/dL — ABNORMAL LOW (ref 8.9–10.3)
Chloride: 100 mmol/L (ref 98–111)
Creatinine, Ser: 3.7 mg/dL — ABNORMAL HIGH (ref 0.61–1.24)
GFR, Estimated: 17 mL/min — ABNORMAL LOW (ref >60)
Glucose, Bld: 132 mg/dL — ABNORMAL HIGH (ref 70–99)
Potassium: 4.5 mmol/L (ref 3.5–5.1)
Sodium: 138 mmol/L (ref 135–145)

## 2021-05-09 LAB — GLUCOSE, CAPILLARY
Glucose-Capillary: 120 mg/dL — ABNORMAL HIGH (ref 70–99)
Glucose-Capillary: 135 mg/dL — ABNORMAL HIGH (ref 70–99)
Glucose-Capillary: 133 mg/dL — ABNORMAL HIGH (ref 70–99)
Glucose-Capillary: 179 mg/dL — ABNORMAL HIGH (ref 70–99)

## 2021-05-09 LAB — HEPATITIS B SURFACE ANTIGEN: Hepatitis B Surface Ag: NONREACTIVE

## 2021-05-09 MED ORDER — ALPRAZOLAM 0.5 MG PO TABS
0.5000 mg | ORAL_TABLET | Freq: Three times a day (TID) | ORAL | Status: DC | PRN
  Administered 2021-05-09 – 2021-05-16 (×5): 0.5 mg via ORAL
  Filled 2021-05-09 (×5): qty 1

## 2021-05-09 MED ORDER — METOLAZONE 5 MG PO TABS
5.0000 mg | ORAL_TABLET | Freq: Once | ORAL | Status: AC
  Administered 2021-05-09: 5 mg via ORAL
  Filled 2021-05-09: qty 1

## 2021-05-09 NOTE — Progress Notes (Signed)
PROGRESS NOTE    Stephen [name]  EGB:[id number] DOB: 28-Oct-1945 DOA: 04/14/2021 PCP: [name], MD   Chief Complain: Shortness of breath  Brief Narrative: Patient is a 75 year old male with history of chronic hypoxic respiratory failure on BiPAP at night, pulmonary hypertension, interstitial lung disease chronically on 4 L of oxygen, chronic diastolic congestive heart failure, coronary disease, diabetes type 2, CKD stage IV who presented here with complaints of shortness of breath/chest pain.  He was recently hospitalized here from 10/16-2010/29 and was managed for NSTEMI , acute respiratory secondary to CHF and COPD exacerbation.  Patient currently being managed for volume overload.  Cardiology, nephrology following here.  Currently on Lasix drip.  Assessment & Plan:   Active Problems:   Acute on chronic diastolic CHF (congestive heart failure) (HCC)   Elevated troponin   CHF (congestive heart failure) (HCC)   Unstable angina (HCC)   Pulmonary hypertension (HCC)   Acute on chronic respiratory failure with hypoxia and hypercapnia (HCC)   CKD (chronic kidney disease), stage IV (HCC)   Interstitial lung disease (HCC)   B12 deficiency   Pressure injury of skin   Acute on chronic diastolic congestive heart failure: Presented with volume overload, LE edema,elevated BNP.  Currently on Lasix drip.  Also on Coreg.  Nephrology adding metolazone.  Acute on chronic hypoxic/hypercarbic respiratory failure: History of pulm hypertension, ILD.  On BiPAP chronically with 4 to 6 L of oxygen during sleep.  Continue supplemental oxygen as needed.  Monitor respiratory status. Chest x-ray done on 05/09/2019 showed perihilar opacities, he has been started on ceftriaxone to cover for PNA  NSTEMI: Presented with chest pain.  Recently admitted here and was managed for the NSTEMI.  Elevated troponin presentation.  Initially started on heparin drip.  Currently on aspirin, Plavix, Imdur, Coreg, Lipitor.   Cardiology following.  Cardiology does not think left heart cath or possible PCI will  improve patient's symptoms.  AKI on CKD stage IV: Creatinine in the range of 3.  Currently on Lasix drip.  Nephrology following here.  AKI is thought to be secondary to ATN.  There is potential plan for renal replacement therapy as per nephrology.  Hypertension: Currently hypertensive.  Monitor blood pressure  Anemia of chronic disease/iron deficiency: Iron studies shows iron of 17.  given a dose of IV iron.  Started on Epogen  Urine retention: Currently with Foley catheter.  Diabetes type 2: Currently on Levemir, sliding scale insulin.  Monitor blood sugars.  OSA: Continue CPAP  Hematuria: Likely secondary to traumatic catheterization.  We will continue to monitor.  Morbid obesity: BMI of 39.6  Generalized weakness/deconditioning: As per wife patient has acute decline and has been bedbound since last few weeks.  We have requested for PT/OT evaluation        DVT prophylaxis:Lovenox Code Status: DNR Family Communication: Wife at bedside Status is: Inpatient  Remains inpatient appropriate because: Currently on IV diuresis.      Consultants: Nephrology, cardiology  Procedures: None  Antimicrobials:  Anti-infectives (From admission, onward)    Start     Dose/Rate Route Frequency Ordered Stop   05/08/21 2000  cefTRIAXone (ROCEPHIN) 1 g in sodium chloride 0.9 % 100 mL IVPB        1 g 200 mL/hr over 30 Minutes Intravenous Every 24 hours 05/08/21 1817     04/30/2021 1000  doxycycline (VIBRA-TABS) tablet 100 mg        100 mg Oral Every 12 hours 04/20/2021 0725 05/05/21 2123  Subjective: Patient seen and examined at the bedside this morning.  Hemodynamically stable but lying on the bed, very weak, chronically ill looking.  Not in acute respiratory distress.  Wife at the bedside.  Denies any new complaints.   Objective: Vitals:   05/08/21 2211 05/09/21 0401 05/09/21 0604 05/09/21 0812   BP:   (!) 158/65 (!) 176/66  Pulse: (!) 52 (!) 52 (!) 55 (!) 53  Resp:   16 18  Temp:   97.9 F (36.6 C) 97.9 F (36.6 C)  TempSrc:   Oral Oral  SpO2: 99% 96% 99% 100%  Weight:      Height:        Intake/Output Summary (Last 24 hours) at 05/09/2021 0828 Last data filed at 05/09/2021 0800 Gross per 24 hour  Intake 1482.37 ml  Output 1000 ml  Net 482.37 ml   Filed Weights   05/06/21 0426 05/07/21 0500 05/08/21 0500  Weight: 116 kg 116.1 kg 108.2 kg    Examination:  General exam: Morbidly obese, weak, lying in bed HEENT: PERRL Respiratory system: Diminished air sounds bilaterally, no wheezes or crackles  Cardiovascular system: S1 & S2 heard, RRR.  Gastrointestinal system: Abdomen is nondistended, soft and nontender. Central nervous system: Alert and oriented Extremities: Bilateral lower extremity edema, no clubbing ,no cyanosis Skin: No rashes, no ulcers,no icterus       Data Reviewed: I have personally reviewed following labs and imaging studies  CBC: Recent Labs  Lab 05/03/21 0716 05/05/21 0555 05/06/21 0530 05/07/21 0551 05/08/21 0801  WBC 11.2* 7.5  --  8.9 5.4  HGB 10.1* 8.7* 9.2* 9.0* 9.0*  HCT 32.0* 27.9* 29.7* 29.8* 28.9*  MCV 87.9 90.3  --  91.4 90.0  PLT 302 291  --  286 440   Basic Metabolic Panel: Recent Labs  Lab 05/05/21 0555 05/06/21 0530 05/07/21 0551 05/08/21 0801 05/09/21 0509  NA 138 138 137 137 138  K 4.2 4.6 4.6 4.6 4.5  CL 100 101 98 100 100  CO2 30 29 32 30 31  GLUCOSE 132* 193* 216* 156* 132*  BUN 69* 70* 73* 72* 80*  CREATININE 3.55* 3.65* 3.44* 3.73* 3.70*  CALCIUM 8.0* 8.2* 8.3* 8.2* 8.3*  MG 2.1  --  2.2  --   --   PHOS 5.4*  --  4.3  --   --    GFR: Estimated Creatinine Clearance: 20.2 mL/min (A) (by C-G formula based on SCr of 3.7 mg/dL (H)). Liver Function Tests: No results for input(s): AST, ALT, ALKPHOS, BILITOT, PROT, ALBUMIN in the last 168 hours. No results for input(s): LIPASE, AMYLASE in the last 168  hours. No results for input(s): AMMONIA in the last 168 hours. Coagulation Profile: No results for input(s): INR, PROTIME in the last 168 hours. Cardiac Enzymes: No results for input(s): CKTOTAL, CKMB, CKMBINDEX, TROPONINI in the last 168 hours. BNP (last 3 results) No results for input(s): PROBNP in the last 8760 hours. HbA1C: No results for input(s): HGBA1C in the last 72 hours. CBG: Recent Labs  Lab 05/08/21 0747 05/08/21 1115 05/08/21 1650 05/08/21 2034 05/09/21 0813  GLUCAP 144* 175* 198* 174* 120*   Lipid Profile: No results for input(s): CHOL, HDL, LDLCALC, TRIG, CHOLHDL, LDLDIRECT in the last 72 hours. Thyroid Function Tests: No results for input(s): TSH, T4TOTAL, FREET4, T3FREE, THYROIDAB in the last 72 hours. Anemia Panel: Recent Labs    05/08/21 0928  VITAMINB12 1,904*  FOLATE 13.9  FERRITIN 166  TIBC 133*  IRON 17*  RETICCTPCT 1.9   Sepsis Labs: No results for input(s): PROCALCITON, LATICACIDVEN in the last 168 hours.  Recent Results (from the past 240 hour(s))  Resp Panel by RT-PCR (Flu A&B, Covid)     Status: None   Collection Time: 04/07/2021 10:46 AM  Result Value Ref Range Status   SARS Coronavirus 2 by RT PCR NEGATIVE NEGATIVE Final    Comment: (NOTE) SARS-CoV-2 target nucleic acids are NOT DETECTED.  The SARS-CoV-2 RNA is generally detectable in upper respiratory specimens during the acute phase of infection. The lowest concentration of SARS-CoV-2 viral copies this assay can detect is 138 copies/mL. A negative result does not preclude SARS-Cov-2 infection and should not be used as the sole basis for treatment or other patient management decisions. A negative result may occur with  improper specimen collection/handling, submission of specimen other than nasopharyngeal swab, presence of viral mutation(s) within the areas targeted by this assay, and inadequate number of viral copies(<138 copies/mL). A negative result must be combined with clinical  observations, patient history, and epidemiological information. The expected result is Negative.  Fact Sheet for Patients:  EntrepreneurPulse.com.au  Fact Sheet for Healthcare Providers:  IncredibleEmployment.be  This test is no t yet approved or cleared by the [place] FDA and  has been authorized for detection and/or diagnosis of SARS-CoV-2 by FDA under an Emergency Use Authorization (EUA). This EUA will remain  in effect (meaning this test can be used) for the duration of the COVID-19 declaration under Section 564(b)(1) of the Act, 21 U.S.C.section 360bbb-3(b)(1), unless the authorization is terminated  or revoked sooner.       Influenza A by PCR NEGATIVE NEGATIVE Final   Influenza B by PCR NEGATIVE NEGATIVE Final    Comment: (NOTE) The Xpert Xpress SARS-CoV-2/FLU/RSV plus assay is intended as an aid in the diagnosis of influenza from Nasopharyngeal swab specimens and should not be used as a sole basis for treatment. Nasal washings and aspirates are unacceptable for Xpert Xpress SARS-CoV-2/FLU/RSV testing.  Fact Sheet for Patients: EntrepreneurPulse.com.au  Fact Sheet for Healthcare Providers: IncredibleEmployment.be  This test is not yet approved or cleared by the [place] FDA and has been authorized for detection and/or diagnosis of SARS-CoV-2 by FDA under an Emergency Use Authorization (EUA). This EUA will remain in effect (meaning this test can be used) for the duration of the COVID-19 declaration under Section 564(b)(1) of the Act, 21 U.S.C. section 360bbb-3(b)(1), unless the authorization is terminated or revoked.  Performed at [facility], [address], [place], [place] [address]          Radiology Studies: US PELVIS (TRANSABDOMINAL ONLY)  Result Date: 05/08/2021 CLINICAL DATA:  Acute urinary retention EXAM: LIMITED ULTRASOUND OF PELVIS TECHNIQUE: Limited  transabdominal ultrasound examination of the pelvis was performed. COMPARISON:  None. FINDINGS: A Foley catheter is seen within a decompressed bladder. IMPRESSION: A Foley catheter balloon is seen within a decompressed bladder. No other abnormalities. Electronically Signed   By: Dorise Bullion III M.D.   On: 05/08/2021 18:23   DG Chest Port 1 View  Result Date: 05/08/2021 CLINICAL DATA:  Shortness of breath. EXAM: PORTABLE CHEST 1 VIEW COMPARISON:  04/10/2021.  Chest CT 04/05/2021 FINDINGS: Low lung volumes. Prominent heart size likely accentuated by technique and low lung volumes. Questionable retrocardiac opacity versus superimposition of soft tissue structures, evaluation is limited by soft tissue attenuation from habitus. Regardless, suspected left pleural effusion there is patchy airspace disease in both perihilar lungs. No pneumothorax. IMPRESSION: 1. Patchy airspace  disease in both perihilar lungs, pneumonia or pulmonary edema. 2. Questionable retrocardiac opacity versus superimposition of soft tissue structures. Regardless, suspected left pleural effusion. Electronically Signed   By: Keith Rake M.D.   On: 05/08/2021 17:14        Scheduled Meds:  aspirin EC  81 mg Oral Daily   atorvastatin  80 mg Oral QHS   calcitRIOL  0.25 mcg Oral Daily   carvedilol  25 mg Oral BID WC   Chlorhexidine Gluconate Cloth  6 each Topical Daily   cloNIDine  0.1 mg Oral TID   clopidogrel  75 mg Oral Daily   dorzolamide-timolol  1 drop Both Eyes BID   enoxaparin (LOVENOX) injection  30 mg Subcutaneous Q24H   epoetin (EPOGEN/PROCRIT) injection  3,000 Units Subcutaneous Q14 Days   ezetimibe  10 mg Oral Daily   feeding supplement  237 mL Oral BID BM   fluticasone furoate-vilanterol  1 puff Inhalation Daily   And   umeclidinium bromide  1 puff Inhalation Daily   gabapentin  100 mg Oral BID   hydrALAZINE  25 mg Oral TID   insulin aspart  0-20 Units Subcutaneous TID WC   insulin aspart  0-5 Units  Subcutaneous QHS   insulin detemir  30 Units Subcutaneous QHS   isosorbide mononitrate  120 mg Oral Daily   levothyroxine  50 mcg Oral Q0600   montelukast  10 mg Oral Daily   pantoprazole  40 mg Oral Daily   sodium chloride flush  3 mL Intravenous Q12H   tamsulosin  0.4 mg Oral QHS   cyanocobalamin  1,000 mcg Oral Daily   Continuous Infusions:  sodium chloride 10 mL/hr at 05/09/21 0600   cefTRIAXone (ROCEPHIN)  IV 1 g (05/08/21 2131)   furosemide (LASIX) 200 mg in dextrose 5% 100 mL (2mg /mL) infusion 8 mg/hr (05/08/21 1608)     LOS: 6 days    Time spent: More than 50% of that time was spent in counseling and/or coordination of care.      Shelly Coss, MD [facility] P11/12/2020, 8:28 AM

## 2021-05-09 NOTE — Progress Notes (Addendum)
PT Cancellation Note  Patient Details Name: GUALBERTO WAHLEN MRN: [id number] DOB: [birth year]   Cancelled Treatment:    Reason Eval/Treat Not Completed: Other (comment) Attempted to see pt for PT session with pt received lying in bed with wife present. Pt declines PT at this time, reporting he's not up to it "after the weekend I've had". Will re-attempt as able.  Addendum: 1103 - Attempted to see pt for PT session with pt received sleeping soundly with CPAP on, wife in room reporting pt had received medication for anxiety. Will f/u as able.   Lavone Nian, PT, DPT 05/09/21, 1:56 PM    Waunita Schooner 05/09/2021, 11:35 AM

## 2021-05-09 NOTE — Progress Notes (Signed)
Progress Note  Patient Name: Stephen [name] Date of Encounter: 05/09/2021  [facility] Cardiologist: Ida Rogue, MD   Subjective   The patient continues to be on furosemide drip and he is -5 L for the admission but urine output decreased significantly over the last 48 hours.  The patient was given 1 dose of metolazone by nephrology today.   Inpatient Medications    Scheduled Meds:  aspirin EC  81 mg Oral Daily   atorvastatin  80 mg Oral QHS   calcitRIOL  0.25 mcg Oral Daily   carvedilol  25 mg Oral BID WC   Chlorhexidine Gluconate Cloth  6 each Topical Daily   cloNIDine  0.1 mg Oral TID   clopidogrel  75 mg Oral Daily   dorzolamide-timolol  1 drop Both Eyes BID   enoxaparin (LOVENOX) injection  30 mg Subcutaneous Q24H   epoetin (EPOGEN/PROCRIT) injection  3,000 Units Subcutaneous Q14 Days   ezetimibe  10 mg Oral Daily   feeding supplement  237 mL Oral BID BM   fluticasone furoate-vilanterol  1 puff Inhalation Daily   And   umeclidinium bromide  1 puff Inhalation Daily   gabapentin  100 mg Oral BID   hydrALAZINE  25 mg Oral TID   insulin aspart  0-20 Units Subcutaneous TID WC   insulin aspart  0-5 Units Subcutaneous QHS   insulin detemir  30 Units Subcutaneous QHS   isosorbide mononitrate  120 mg Oral Daily   levothyroxine  50 mcg Oral Q0600   montelukast  10 mg Oral Daily   pantoprazole  40 mg Oral Daily   sodium chloride flush  3 mL Intravenous Q12H   tamsulosin  0.4 mg Oral QHS   cyanocobalamin  1,000 mcg Oral Daily   Continuous Infusions:  sodium chloride 10 mL/hr at 05/09/21 0600   cefTRIAXone (ROCEPHIN)  IV 1 g (05/08/21 2131)   furosemide (LASIX) 200 mg in dextrose 5% 100 mL (2mg /mL) infusion 8 mg/hr (05/08/21 1608)   PRN Meds: sodium chloride, acetaminophen, albuterol, ALPRAZolam, HYDROcodone-acetaminophen, nitroGLYCERIN, ondansetron (ZOFRAN) IV, polyethylene glycol   Vital Signs    Vitals:   05/09/21 0604 05/09/21 0812 05/09/21 1108 05/09/21  1531  BP: (!) 158/65 (!) 176/66 (!) 136/52 (!) 147/53  Pulse: (!) 55 (!) 53 (!) 51 (!) 50  Resp: 16 18 18 18   Temp: 97.9 F (36.6 C) 97.9 F (36.6 C) (!) 97.4 F (36.3 C) 97.7 F (36.5 C)  TempSrc: Oral Oral Oral Oral  SpO2: 99% 100% 92% 94%  Weight:      Height:        Intake/Output Summary (Last 24 hours) at 05/09/2021 1752 Last data filed at 05/09/2021 1500 Gross per 24 hour  Intake 340 ml  Output 1175 ml  Net -835 ml    Last 3 Weights 05/08/2021 05/07/2021 05/06/2021  Weight (lbs) 238 lb 8.6 oz 255 lb 15.3 oz 255 lb 11.7 oz  Weight (kg) 108.2 kg 116.1 kg 116 kg  Some encounter information is confidential and restricted. Go to Review Flowsheets activity to see all data.      Telemetry    Sinus bradycardia, heart rate 55- Personally Reviewed  ECG     - Personally Reviewed  Physical Exam   GEN: No acute distress.  Appears frail, tired, soft-spoken Neck: No JVD Cardiac: RRR, Respiratory: Diminished breath sounds at bases GI: Soft, nontender, non-distended  MS: 1+ edema; No deformity. Neuro:  Nonfocal  Psych: Normal affect  Labs  High Sensitivity Troponin:   Recent Labs  Lab 04/18/21 0505 04/18/21 0658 04/18/21 1240 04/15/2021 0213 04/17/2021 0344  TROPONINIHS 2,129* 1,997* 1,842* 1,417* 1,514*      Chemistry Recent Labs  Lab 05/05/21 0555 05/06/21 0530 05/07/21 0551 05/08/21 0801 05/09/21 0509  NA 138   < > 137 137 138  K 4.2   < > 4.6 4.6 4.5  CL 100   < > 98 100 100  CO2 30   < > 32 30 31  GLUCOSE 132*   < > 216* 156* 132*  BUN 69*   < > 73* 72* 80*  CREATININE 3.55*   < > 3.44* 3.73* 3.70*  CALCIUM 8.0*   < > 8.3* 8.2* 8.3*  MG 2.1  --  2.2  --   --   GFRNONAA 17*   < > 18* 16* 17*  ANIONGAP 8   < > 7 7 7    < > = values in this interval not displayed.     Lipids No results for input(s): CHOL, TRIG, HDL, LABVLDL, LDLCALC, CHOLHDL in the last 168 hours.  Hematology Recent Labs  Lab 05/05/21 0555 05/06/21 0530 05/07/21 0551  05/08/21 0801 05/08/21 0928  WBC 7.5  --  8.9 5.4  --   RBC 3.09*  --  3.26* 3.21* 3.17*  HGB 8.7* 9.2* 9.0* 9.0*  --   HCT 27.9* 29.7* 29.8* 28.9*  --   MCV 90.3  --  91.4 90.0  --   MCH 28.2  --  27.6 28.0  --   MCHC 31.2  --  30.2 31.1  --   RDW 15.6*  --  15.6* 15.6*  --   PLT 291  --  286 254  --     Thyroid No results for input(s): TSH, FREET4 in the last 168 hours.  BNP No results for input(s): BNP, PROBNP in the last 168 hours.   DDimer No results for input(s): DDIMER in the last 168 hours.   Radiology    US PELVIS (TRANSABDOMINAL ONLY)  Result Date: 05/08/2021 CLINICAL DATA:  Acute urinary retention EXAM: LIMITED ULTRASOUND OF PELVIS TECHNIQUE: Limited transabdominal ultrasound examination of the pelvis was performed. COMPARISON:  None. FINDINGS: A Foley catheter is seen within a decompressed bladder. IMPRESSION: A Foley catheter balloon is seen within a decompressed bladder. No other abnormalities. Electronically Signed   By: Dorise Bullion III M.D.   On: 05/08/2021 18:23   DG Chest Port 1 View  Result Date: 05/08/2021 CLINICAL DATA:  Shortness of breath. EXAM: PORTABLE CHEST 1 VIEW COMPARISON:  04/13/2021.  Chest CT 04/05/2021 FINDINGS: Low lung volumes. Prominent heart size likely accentuated by technique and low lung volumes. Questionable retrocardiac opacity versus superimposition of soft tissue structures, evaluation is limited by soft tissue attenuation from habitus. Regardless, suspected left pleural effusion there is patchy airspace disease in both perihilar lungs. No pneumothorax. IMPRESSION: 1. Patchy airspace disease in both perihilar lungs, pneumonia or pulmonary edema. 2. Questionable retrocardiac opacity versus superimposition of soft tissue structures. Regardless, suspected left pleural effusion. Electronically Signed   By: Keith Rake M.D.   On: 05/08/2021 17:14    Cardiac Studies   04/18/2021  1. Left ventricular ejection fraction, by estimation, is  60 to 65%. The  left ventricle has normal function. Left ventricular endocardial border  not optimally defined to evaluate regional wall motion. The left  ventricular internal cavity size was mildly  dilated. There is mild left ventricular hypertrophy. Left ventricular  diastolic  parameters are consistent with Grade I diastolic dysfunction  (impaired relaxation). Elevated left atrial pressure.   2. Right ventricular systolic function is normal. The right ventricular  size is not well visualized.   3. The mitral valve is abnormal. Trivial mitral valve regurgitation. Mild  mitral stenosis. The mean mitral valve gradient is 5.0 mmHg.   4. Tricuspid valve regurgitation not well assessed.   5. The aortic valve has an indeterminant number of cusps. There is mild  calcification of the aortic valve. There is moderate thickening of the  aortic valve. Aortic valve regurgitation is trivial. Mild to moderate  aortic valve sclerosis/calcification is  present, without any evidence of aortic stenosis.   Patient Profile     75 y.o. male with history of CAD, HFpEF, COPD, pulmonary fibrosis, respiratory failure on home oxygen, CKD 4, diabetes being seen for shortness of breath and HFpEF.  Assessment & Plan    1. Acute on chronic diastolic heart failure with pulmonary hypertension: Difficult diuresis likely due to underlying advanced chronic kidney disease.  I agree with continuation of furosemide drip and using metolazone as needed.  Nephrology is closely following with plans to initiate dialysis if no improvement.  Palliative care saw the patient today.  2.  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  Recommend continuing medical therapy with aspirin, atorvastatin, carvedilol, clopidogrel and Imdur.   3.  COPD, pulmonary fibrosis -CPAP, oxygenation, inhalers as per pulmonary medicine -Long-term prognosis remains  guarded     Signed, Kathlyn Sacramento, MD  05/09/2021, 5:52 PM

## 2021-05-09 NOTE — Progress Notes (Signed)
Palliative Care Progress Note, Assessment & Plan   Patient Name: Stephen [name]       Date: 05/09/2021 DOB: [birth year]  Age: 75 y.o. MRN#: [id number] Attending Physician: Shelly Coss, MD Primary Care Physician: [name], MD Admit Date: 05/01/2021  Reason for Consultation/Follow-up: Establishing goals of care  HPI: 75 y.o. male  with past medical history of multivessel coronary disease, chronic heart failure, pulmonary fibrosis, pulmonary hypertension, COPD, chronic kidney disease, diabetes, renal artery stenosis, and interstitial lung disease admitted on 04/06/2021 with acute on chronic heart failure with demand ischemia.   Palliative medicine was consulted to discuss goals of care and symptom management.  Plan of Care: I have reviewed medical records including EPIC notes, labs and imaging,  assessed the patient and then met with patient and his wife at bedside to discuss diagnosis prognosis, GOC, EOL wishes, disposition and options.  Patient is lethargic and is in and out of sleep during our discussion.  His wife shared that she is hopeful that hemodialysis will help him.  She just met with nephrology and the plan is to start hemodialysis tomorrow.  I reviewed the pros and cons and risks and benefits of starting hemodialysis.  I shared my concern with his cardiac and pulmonary status.  She shared she believes it would just be temporary.  During our discussion the patient care tech came in to clean the patient up and it was a bit of a distraction.  I shared I would return and speak with both of them later today.  Rounded on the patient again at 3:00pm this afternoon.  Patient is sleeping in bed with BiPAP in place.  Patient's wife is at bedside.  She endorses patient became anxious and took a  Xanax about an hour ago.  He has been resting well in no apparent distress since that time.  I again shared my great concern with the patient's current health status.  I again attempted to elicit goals of the patient.  His wife shared that the patient stated he is ready to die and is not worried about dying but is just not ready to die yet.  I reiterated that hemodialysis is not an easy procedure to tolerate.  I detailed that if he needs to remain on hemodialysis that he will need to be physically able to sit up in a chair and transfer back and forth as well as sit up in a chair for several hours at least 3 days a week.  His wife shared that she knew family members who tolerated hemodialysis for up to 6 years.  She also shared that when they stopped dialysis the patient died within a few days.  I educated her that every patient's tolerance of hemodialysis is different.  I just wanted to ensure that she had a clear understanding that hemodialysis does not change his underlying interstitial lung disease and heart condition.  I shared that I know the patient has good days and bad days.  However, I shared that if he is unable to participate in therapy that the skilled nursing facility for rehab is not likely going to be an option.    I reiterated that I am hopeful that  tomorrow is a better day and that we perhaps could have a more clear discussion with the patient's participation.  Patient's wife was in agreement and said she would look forward to speaking with palliative medicine again tomorrow.  Discussed with patient/family the importance of continued conversation with family and the medical providers regarding overall plan of care and treatment options, ensuring decisions are within the context of the patient's values and GOCs.    Questions and concerns were addressed. The family was encouraged to call with questions or concerns.   Code Status: DNR  Prognosis:  Unable to determine  Discharge  Planning: To Be Determined  Recommendations/Plan: Patient would like to move forward with hemodialysis tomorrow Disposition is questionable now that patient is unable to participate in therapies Time for outcomes  Care plan was discussed with patient, patient's wife, PT, SW  Length of Stay: 6  Physical Exam Vitals and nursing note reviewed.  Constitutional:      Appearance: He is ill-appearing.     Comments: Lethargic  Cardiovascular:     Rate and Rhythm: Normal rate.  Pulmonary:     Effort: Pulmonary effort is normal.     Comments: Bipap Abdominal:     Palpations: Abdomen is soft.  Genitourinary:    Comments: Urine is red Musculoskeletal:     Comments: Generalized weakness  Skin:    General: Skin is warm and dry.  Neurological:     Mental Status: He is oriented to person, place, and time.  Psychiatric:        Mood and Affect: Mood normal.        Behavior: Behavior normal.            Vital Signs: BP (!) 136/52 (BP Location: Right Arm)   Pulse (!) 51   Temp (!) 97.4 F (36.3 C) (Oral)   Resp 18   Ht _0  (1.651 m)   Wt 108.2 kg   SpO2 92%   BMI 39.69 kg/m  SpO2: SpO2: 92 % O2 Device: O2 Device: Nasal Cannula O2 Flow Rate: O2 Flow Rate (L/min): 5 L/min      Palliative Assessment/Data: 30%       Total Time 35 minutes Prolonged Time Billed  no   Greater than 50%  of this time was spent counseling and coordinating care related to the above assessment and plan.  Thank you for allowing the Palliative Medicine Team to assist in the care of this patient.  Montier Ilsa Iha, FNP-BC Palliative Medicine Team Team Phone # [phone]

## 2021-05-09 NOTE — Progress Notes (Signed)
[facility], [place] 05/09/21  Subjective:   LOS: 6 11/06 0701 - 11/07 0700 In: 1482.4 [P.O.:1190; I.V.:192.4; IV Piggyback:100] Out: 650 [Urine:650] Patient known to our practice from outpatient follow-up.  He is followed by Dr. Holley Raring and was last seen on August 9. He has medical problems of longstanding diabetes, hypertension, history of nephrolithiasis, hyperlipidemia, coronary artery disease status post PTCA, pulmonary fibrosis on 4 L chronic oxygen, obstructive sleep apnea, history of right adrenalectomy, left renal artery stenosis status post stent, pulmonary hypertension  Patient appears more anxious today, wife at bedside Continues to have poor appetite Poor urine output recorded at 650 mL in preceding 24 hours. Creatinine remains elevated Patient complains of discomfort and shortness of breath Supplemental O2 increased to 5 L per nasal cannula  Objective:  Vital signs in last 24 hours:  Temp:  [97.4 F (36.3 C)-98.2 F (36.8 C)] 97.4 F (36.3 C) (11/07 1108) Pulse Rate:  [51-56] 51 (11/07 1108) Resp:  [16-18] 18 (11/07 1108) BP: (136-176)/(52-69) 136/52 (11/07 1108) SpO2:  [92 %-100 %] 92 % (11/07 1108)  Weight change:  Filed Weights   05/06/21 0426 05/07/21 0500 05/08/21 0500  Weight: 116 kg 116.1 kg 108.2 kg    Intake/Output:    Intake/Output Summary (Last 24 hours) at 05/09/2021 1127 Last data filed at 05/09/2021 1024 Gross per 24 hour  Intake 1482.37 ml  Output 1000 ml  Net 482.37 ml      Physical Exam: General: Anxious, laying in the bed  HEENT Moist oral mucous membranes  Pulm/lungs Clear bilaterally, 5 L O2 [name]  CVS/Heart Holosystolic murmur, no rub  Abdomen:  Soft, obese, nontender  Extremities: 2+ pitting edema bilaterally  Neurologic: Alert, oriented  Skin: Warm, dry  GU Foley catheter       Basic Metabolic Panel:  Recent Labs  Lab 05/05/21 0555 05/06/21 0530 05/07/21 0551 05/08/21 0801 05/09/21 0509   NA 138 138 137 137 138  K 4.2 4.6 4.6 4.6 4.5  CL 100 101 98 100 100  CO2 30 29 32 30 31  GLUCOSE 132* 193* 216* 156* 132*  BUN 69* 70* 73* 72* 80*  CREATININE 3.55* 3.65* 3.44* 3.73* 3.70*  CALCIUM 8.0* 8.2* 8.3* 8.2* 8.3*  MG 2.1  --  2.2  --   --   PHOS 5.4*  --  4.3  --   --       CBC: Recent Labs  Lab 05/03/21 0716 05/05/21 0555 05/06/21 0530 05/07/21 0551 05/08/21 0801  WBC 11.2* 7.5  --  8.9 5.4  HGB 10.1* 8.7* 9.2* 9.0* 9.0*  HCT 32.0* 27.9* 29.7* 29.8* 28.9*  MCV 87.9 90.3  --  91.4 90.0  PLT 302 291  --  286 254      No results found for: HEPBSAG, HEPBSAB, HEPBIGM    Microbiology:  Recent Results (from the past 240 hour(s))  Resp Panel by RT-PCR (Flu A&B, Covid)     Status: None   Collection Time: 04/08/2021 10:46 AM  Result Value Ref Range Status   SARS Coronavirus 2 by RT PCR NEGATIVE NEGATIVE Final    Comment: (NOTE) SARS-CoV-2 target nucleic acids are NOT DETECTED.  The SARS-CoV-2 RNA is generally detectable in upper respiratory specimens during the acute phase of infection. The lowest concentration of SARS-CoV-2 viral copies this assay can detect is 138 copies/mL. A negative result does not preclude SARS-Cov-2 infection and should not be used as the sole basis for treatment or other patient management decisions.  A negative result may occur with  improper specimen collection/handling, submission of specimen other than nasopharyngeal swab, presence of viral mutation(s) within the areas targeted by this assay, and inadequate number of viral copies(<138 copies/mL). A negative result must be combined with clinical observations, patient history, and epidemiological information. The expected result is Negative.  Fact Sheet for Patients:  EntrepreneurPulse.com.au  Fact Sheet for Healthcare Providers:  IncredibleEmployment.be  This test is no t yet approved or cleared by the [place] FDA and  has been  authorized for detection and/or diagnosis of SARS-CoV-2 by FDA under an Emergency Use Authorization (EUA). This EUA will remain  in effect (meaning this test can be used) for the duration of the COVID-19 declaration under Section 564(b)(1) of the Act, 21 U.S.C.section 360bbb-3(b)(1), unless the authorization is terminated  or revoked sooner.       Influenza A by PCR NEGATIVE NEGATIVE Final   Influenza B by PCR NEGATIVE NEGATIVE Final    Comment: (NOTE) The Xpert Xpress SARS-CoV-2/FLU/RSV plus assay is intended as an aid in the diagnosis of influenza from Nasopharyngeal swab specimens and should not be used as a sole basis for treatment. Nasal washings and aspirates are unacceptable for Xpert Xpress SARS-CoV-2/FLU/RSV testing.  Fact Sheet for Patients: EntrepreneurPulse.com.au  Fact Sheet for Healthcare Providers: IncredibleEmployment.be  This test is not yet approved or cleared by the [place] FDA and has been authorized for detection and/or diagnosis of SARS-CoV-2 by FDA under an Emergency Use Authorization (EUA). This EUA will remain in effect (meaning this test can be used) for the duration of the COVID-19 declaration under Section 564(b)(1) of the Act, 21 U.S.C. section 360bbb-3(b)(1), unless the authorization is terminated or revoked.  Performed at [facility], [address] [place], [place] [address]     Coagulation Studies: No results for input(s): LABPROT, INR in the last 72 hours.   Urinalysis: No results for input(s): COLORURINE, LABSPEC, PHURINE, GLUCOSEU, HGBUR, BILIRUBINUR, KETONESUR, PROTEINUR, UROBILINOGEN, NITRITE, LEUKOCYTESUR in the last 72 hours.  Invalid input(s): APPERANCEUR     Imaging: US PELVIS (TRANSABDOMINAL ONLY)  Result Date: 05/08/2021 CLINICAL DATA:  Acute urinary retention EXAM: LIMITED ULTRASOUND OF PELVIS TECHNIQUE: Limited transabdominal ultrasound examination of the pelvis was  performed. COMPARISON:  None. FINDINGS: A Foley catheter is seen within a decompressed bladder. IMPRESSION: A Foley catheter balloon is seen within a decompressed bladder. No other abnormalities. Electronically Signed   By: Dorise Bullion III M.D.   On: 05/08/2021 18:23   DG Chest Port 1 View  Result Date: 05/08/2021 CLINICAL DATA:  Shortness of breath. EXAM: PORTABLE CHEST 1 VIEW COMPARISON:  04/02/2021.  Chest CT 04/05/2021 FINDINGS: Low lung volumes. Prominent heart size likely accentuated by technique and low lung volumes. Questionable retrocardiac opacity versus superimposition of soft tissue structures, evaluation is limited by soft tissue attenuation from habitus. Regardless, suspected left pleural effusion there is patchy airspace disease in both perihilar lungs. No pneumothorax. IMPRESSION: 1. Patchy airspace disease in both perihilar lungs, pneumonia or pulmonary edema. 2. Questionable retrocardiac opacity versus superimposition of soft tissue structures. Regardless, suspected left pleural effusion. Electronically Signed   By: Keith Rake M.D.   On: 05/08/2021 17:14     Medications:    sodium chloride 10 mL/hr at 05/09/21 0600   cefTRIAXone (ROCEPHIN)  IV 1 g (05/08/21 2131)   furosemide (LASIX) 200 mg in dextrose 5% 100 mL (2mg /mL) infusion 8 mg/hr (05/08/21 1608)     aspirin EC  81 mg Oral Daily  atorvastatin  80 mg Oral QHS   calcitRIOL  0.25 mcg Oral Daily   carvedilol  25 mg Oral BID WC   Chlorhexidine Gluconate Cloth  6 each Topical Daily   cloNIDine  0.1 mg Oral TID   clopidogrel  75 mg Oral Daily   dorzolamide-timolol  1 drop Both Eyes BID   enoxaparin (LOVENOX) injection  30 mg Subcutaneous Q24H   epoetin (EPOGEN/PROCRIT) injection  3,000 Units Subcutaneous Q14 Days   ezetimibe  10 mg Oral Daily   feeding supplement  237 mL Oral BID BM   fluticasone furoate-vilanterol  1 puff Inhalation Daily   And   umeclidinium bromide  1 puff Inhalation Daily   gabapentin   100 mg Oral BID   hydrALAZINE  25 mg Oral TID   insulin aspart  0-20 Units Subcutaneous TID WC   insulin aspart  0-5 Units Subcutaneous QHS   insulin detemir  30 Units Subcutaneous QHS   isosorbide mononitrate  120 mg Oral Daily   levothyroxine  50 mcg Oral Q0600   metolazone  5 mg Oral Once   montelukast  10 mg Oral Daily   pantoprazole  40 mg Oral Daily   sodium chloride flush  3 mL Intravenous Q12H   tamsulosin  0.4 mg Oral QHS   cyanocobalamin  1,000 mcg Oral Daily   sodium chloride, acetaminophen, albuterol, HYDROcodone-acetaminophen, nitroGLYCERIN, ondansetron (ZOFRAN) IV, polyethylene glycol  Assessment/ Plan:  75 y.o. male with medical problems of longstanding diabetes, hypertension, history of nephrolithiasis, hyperlipidemia, coronary artery disease status post PTCA, pulmonary fibrosis on 4 L chronic oxygen, obstructive sleep apnea, history of right adrenalectomy, left renal artery stenosis status post stent, pulmonary hypertension   Active Problems:   Acute on chronic diastolic CHF (congestive heart failure) (HCC)   Elevated troponin   CHF (congestive heart failure) (HCC)   Unstable angina (HCC)   Pulmonary hypertension (HCC)   Acute on chronic respiratory failure with hypoxia and hypercapnia (HCC)   CKD (chronic kidney disease), stage IV (HCC)   Interstitial lung disease (HCC)   B12 deficiency   Pressure injury of skin   #. CKD st 4 Recent Labs    05/06/21 0530 05/07/21 0551 05/08/21 0801 05/09/21 0509  CREATININE 3.65* 3.44* 3.73* 3.70*   Patient has underlying CKD stage IV.  CKD risk factors include diabetes type 2, hypertension, history of IV contrast exposure, atherosclerosis and age Baseline appears to be 2.66/GFR 25 from April 17, 2021 Creatinine remains elevated.  Lasix drip remains with minimal urine output.  Foley catheter placed for obstruction, gross hematuria noted.  We will continue Lasix drip, and prescribed metolazone 5 mg p.o. once to improve  urine output.  Discussed with patient and wife that if this is not successful, we will need to initiate hemodialysis tomorrow.  Patient and family agreeable to plan.  We will monitor urine output, labs, and patient presentation in the morning and determine how to proceed.  #. Anemia of CKD  Lab Results  Component Value Date   HGB 9.0 (L) 05/08/2021   Hemoglobin at lower end of goal we will continue to monitor.  #. SHPTH  No results found for: PTH Lab Results  Component Value Date   PHOS 4.3 05/07/2021   Outpatient PTH 182 from February 08, 2021 Calcium below target at 8.3 Continue calcitriol daily  #. HTN with CKD and lower extremity edema Blood pressure 136/52 Continue Lasix drip, one-time dose of metolazone as above  #. Diabetes type 2 with  CKD Hgb A1c MFr Bld (%)  Date Value  05/01/2021 7.5 (H)  Management as per primary team Glucose stable  #Pulmonary hypertension Right heart catheterization December 06, 2020 Pulmonary artery pressure of 44 mm, right ventricular pressure of 48 mm 2D echo from April 18, 2021.  LVEF 60 to 40%, grade 1 diastolic dysfunction, elevated left atrial pressure, moderate thickening of aortic valve, mild calcification of mitral valve.  Cardiology following with no further invasive testing at this time     LOS: [place] 11/7/202211:27 AM  [place], [place]

## 2021-05-10 DIAGNOSIS — J9621 Acute and chronic respiratory failure with hypoxia: Secondary | ICD-10-CM | POA: Diagnosis not present

## 2021-05-10 DIAGNOSIS — I5033 Acute on chronic diastolic (congestive) heart failure: Secondary | ICD-10-CM | POA: Diagnosis not present

## 2021-05-10 DIAGNOSIS — J9622 Acute and chronic respiratory failure with hypercapnia: Secondary | ICD-10-CM | POA: Diagnosis not present

## 2021-05-10 LAB — CBC WITH DIFFERENTIAL/PLATELET
Abs Immature Granulocytes: 0.02 10*3/uL (ref 0.00–0.07)
Basophils Absolute: 0 10*3/uL (ref 0.0–0.1)
Basophils Relative: 1 %
Eosinophils Absolute: 0.2 10*3/uL (ref 0.0–0.5)
Eosinophils Relative: 4 %
HCT: 27.2 % — ABNORMAL LOW (ref 39.0–52.0)
Hemoglobin: 8.4 g/dL — ABNORMAL LOW (ref 13.0–17.0)
Immature Granulocytes: 1 %
Lymphocytes Relative: 12 %
Lymphs Abs: 0.5 10*3/uL — ABNORMAL LOW (ref 0.7–4.0)
MCH: 27.2 pg (ref 26.0–34.0)
MCHC: 30.9 g/dL (ref 30.0–36.0)
MCV: 88 fL (ref 80.0–100.0)
Monocytes Absolute: 0.6 10*3/uL (ref 0.1–1.0)
Monocytes Relative: 15 %
Neutro Abs: 3 10*3/uL (ref 1.7–7.7)
Neutrophils Relative %: 67 %
Platelets: 259 10*3/uL (ref 150–400)
RBC: 3.09 MIL/uL — ABNORMAL LOW (ref 4.22–5.81)
RDW: 15.8 % — ABNORMAL HIGH (ref 11.5–15.5)
WBC: 4.3 10*3/uL (ref 4.0–10.5)
nRBC: 0 % (ref 0.0–0.2)

## 2021-05-10 LAB — BASIC METABOLIC PANEL
Anion gap: 10 (ref 5–15)
BUN: 79 mg/dL — ABNORMAL HIGH (ref 8–23)
CO2: 30 mmol/L (ref 22–32)
Calcium: 8 mg/dL — ABNORMAL LOW (ref 8.9–10.3)
Chloride: 98 mmol/L (ref 98–111)
Creatinine, Ser: 3.59 mg/dL — ABNORMAL HIGH (ref 0.61–1.24)
GFR, Estimated: 17 mL/min — ABNORMAL LOW (ref >60)
Glucose, Bld: 162 mg/dL — ABNORMAL HIGH (ref 70–99)
Potassium: 3.9 mmol/L (ref 3.5–5.1)
Sodium: 138 mmol/L (ref 135–145)

## 2021-05-10 LAB — GLUCOSE, CAPILLARY
Glucose-Capillary: 140 mg/dL — ABNORMAL HIGH (ref 70–99)
Glucose-Capillary: 138 mg/dL — ABNORMAL HIGH (ref 70–99)
Glucose-Capillary: 151 mg/dL — ABNORMAL HIGH (ref 70–99)
Glucose-Capillary: 132 mg/dL — ABNORMAL HIGH (ref 70–99)

## 2021-05-10 NOTE — Progress Notes (Signed)
Progress Note  Patient Name: Stephen [name] Date of Encounter: 05/10/2021  [facility] Cardiologist: Ida Rogue, MD   Subjective   He is feeling a little bit better today.  Less short of breath.  The patient continues to be on furosemide drip.  Documented urine output 2.1 L for the past 24 hours and is net -7.3 liters for the admission.  Renal function is stable to slightly improved today.  No chest pain or palpitations.  Inpatient Medications    Scheduled Meds:  aspirin EC  81 mg Oral Daily   atorvastatin  80 mg Oral QHS   calcitRIOL  0.25 mcg Oral Daily   carvedilol  25 mg Oral BID WC   Chlorhexidine Gluconate Cloth  6 each Topical Daily   cloNIDine  0.1 mg Oral TID   clopidogrel  75 mg Oral Daily   dorzolamide-timolol  1 drop Both Eyes BID   enoxaparin (LOVENOX) injection  30 mg Subcutaneous Q24H   epoetin (EPOGEN/PROCRIT) injection  3,000 Units Subcutaneous Q14 Days   ezetimibe  10 mg Oral Daily   feeding supplement  237 mL Oral BID BM   fluticasone furoate-vilanterol  1 puff Inhalation Daily   And   umeclidinium bromide  1 puff Inhalation Daily   gabapentin  100 mg Oral BID   hydrALAZINE  25 mg Oral TID   insulin aspart  0-20 Units Subcutaneous TID WC   insulin aspart  0-5 Units Subcutaneous QHS   insulin detemir  30 Units Subcutaneous QHS   isosorbide mononitrate  120 mg Oral Daily   levothyroxine  50 mcg Oral Q0600   montelukast  10 mg Oral Daily   pantoprazole  40 mg Oral Daily   sodium chloride flush  3 mL Intravenous Q12H   tamsulosin  0.4 mg Oral QHS   cyanocobalamin  1,000 mcg Oral Daily   Continuous Infusions:  sodium chloride 10 mL/hr at 05/09/21 0600   cefTRIAXone (ROCEPHIN)  IV Stopped (05/10/21 0701)   furosemide (LASIX) 200 mg in dextrose 5% 100 mL (2mg /mL) infusion 8 mg/hr (05/08/21 1608)   PRN Meds: sodium chloride, acetaminophen, albuterol, ALPRAZolam, HYDROcodone-acetaminophen, nitroGLYCERIN, ondansetron (ZOFRAN) IV, polyethylene  glycol   Vital Signs    Vitals:   05/09/21 2251 05/10/21 0310 05/10/21 0736 05/10/21 0800  BP:  (!) 135/56 (!) 154/57   Pulse: (!) 56 (!) 55 (!) 53   Resp:  15 16 19   Temp:  98.5 F (36.9 C) 98.6 F (37 C)   TempSrc:   Oral   SpO2: 97% 98% 97%   Weight:  117.5 kg    Height:        Intake/Output Summary (Last 24 hours) at 05/10/2021 0958 Last data filed at 05/10/2021 0930 Gross per 24 hour  Intake 240 ml  Output 2726 ml  Net -2486 ml    Last 3 Weights 05/10/2021 05/08/2021 05/07/2021  Weight (lbs) 259 lb 0.7 oz 238 lb 8.6 oz 255 lb 15.3 oz  Weight (kg) 117.5 kg 108.2 kg 116.1 kg  Some encounter information is confidential and restricted. Go to Review Flowsheets activity to see all data.      Telemetry    Sinus bradycardia, heart rate 50s bpm - Personally Reviewed  ECG    No new tracings - Personally Reviewed  Physical Exam   GEN: No acute distress.  Appears frail, tired, soft-spoken Neck: No JVD Cardiac: RRR Respiratory: Diminished breath sounds at bases GI: Soft, nontender, non-distended  MS: 1+ edema; No deformity.  Neuro:  Nonfocal  Psych: Normal affect  Labs    High Sensitivity Troponin:   Recent Labs  Lab 04/18/21 0505 04/18/21 0658 04/18/21 1240 05/01/2021 0213 04/13/2021 0344  TROPONINIHS 2,129* 1,997* 1,842* 1,417* 1,514*      Chemistry Recent Labs  Lab 05/05/21 0555 05/06/21 0530 05/07/21 0551 05/08/21 0801 05/09/21 0509 05/10/21 0501  NA 138   < > 137 137 138 138  K 4.2   < > 4.6 4.6 4.5 3.9  CL 100   < > 98 100 100 98  CO2 30   < > 32 30 31 30   GLUCOSE 132*   < > 216* 156* 132* 162*  BUN 69*   < > 73* 72* 80* 79*  CREATININE 3.55*   < > 3.44* 3.73* 3.70* 3.59*  CALCIUM 8.0*   < > 8.3* 8.2* 8.3* 8.0*  MG 2.1  --  2.2  --   --   --   GFRNONAA 17*   < > 18* 16* 17* 17*  ANIONGAP 8   < > 7 7 7 10    < > = values in this interval not displayed.     Lipids No results for input(s): CHOL, TRIG, HDL, LABVLDL, LDLCALC, CHOLHDL in the last  168 hours.  Hematology Recent Labs  Lab 05/07/21 0551 05/08/21 0801 05/08/21 0928 05/10/21 0501  WBC 8.9 5.4  --  4.3  RBC 3.26* 3.21* 3.17* 3.09*  HGB 9.0* 9.0*  --  8.4*  HCT 29.8* 28.9*  --  27.2*  MCV 91.4 90.0  --  88.0  MCH 27.6 28.0  --  27.2  MCHC 30.2 31.1  --  30.9  RDW 15.6* 15.6*  --  15.8*  PLT 286 254  --  259    Thyroid No results for input(s): TSH, FREET4 in the last 168 hours.  BNP No results for input(s): BNP, PROBNP in the last 168 hours.   DDimer No results for input(s): DDIMER in the last 168 hours.   Radiology    US PELVIS (TRANSABDOMINAL ONLY)  Result Date: 05/08/2021 IMPRESSION: A Foley catheter balloon is seen within a decompressed bladder. No other abnormalities. Electronically Signed   By: Dorise Bullion III M.D.   On: 05/08/2021 18:23   DG Chest Port 1 View  Result Date: 05/08/2021 IMPRESSION: 1. Patchy airspace disease in both perihilar lungs, pneumonia or pulmonary edema. 2. Questionable retrocardiac opacity versus superimposition of soft tissue structures. Regardless, suspected left pleural effusion. Electronically Signed   By: Keith Rake M.D.   On: 05/08/2021 17:14    Cardiac Studies   04/18/2021  1. Left ventricular ejection fraction, by estimation, is 60 to 65%. The  left ventricle has normal function. Left ventricular endocardial border  not optimally defined to evaluate regional wall motion. The left  ventricular internal cavity size was mildly  dilated. There is mild left ventricular hypertrophy. Left ventricular  diastolic parameters are consistent with Grade I diastolic dysfunction  (impaired relaxation). Elevated left atrial pressure.   2. Right ventricular systolic function is normal. The right ventricular  size is not well visualized.   3. The mitral valve is abnormal. Trivial mitral valve regurgitation. Mild  mitral stenosis. The mean mitral valve gradient is 5.0 mmHg.   4. Tricuspid valve regurgitation not well  assessed.   5. The aortic valve has an indeterminant number of cusps. There is mild  calcification of the aortic valve. There is moderate thickening of the  aortic valve. Aortic valve regurgitation  is trivial. Mild to moderate  aortic valve sclerosis/calcification is  present, without any evidence of aortic stenosis.   Patient Profile     75 y.o. male with history of CAD, HFpEF, COPD, pulmonary fibrosis, respiratory failure on home oxygen, CKD 4, diabetes being seen for shortness of breath and HFpEF.  Assessment & Plan    1. Acute on chronic diastolic heart failure with pulmonary hypertension: -Difficult diuresis likely due to underlying advanced chronic kidney disease -Continue of furosemide drip and using metolazone as needed, with the assistance of nephrology, appreciate their support -There has been mention of possible initiation of dialysis if his renal function does not improve, hopefully this can be avoided -He is being followed by palliative care, appreciate their support  2.  NSTEMI: -Cardiac catheterization February 2022 showed moderate RCA and LAD disease and chronic occlusion of the mid left circumflex -Recommend continuing medical therapy with aspirin, atorvastatin, carvedilol, clopidogrel and Imdur -No plans for inpatient ischemic evaluation   3.  COPD/pulmonary fibrosis: -CPAP, oxygenation, inhalers as per pulmonary medicine -Long-term prognosis remains guarded     Signed, Christell Faith, PA-C  05/10/2021, 9:58 AM

## 2021-05-10 NOTE — Progress Notes (Signed)
Physical Therapy Treatment Patient Details Name: Stephen [name] MRN: [id number] DOB: 10/16/1945 Today's Date: 05/10/2021   History of Present Illness Stephen [name] is a 75 y.o. male with medical history significant for , interstitial lung disease, pulmonary hypertension, OSA with chronic respiratory failure on home O2 at 4L and Bipap, followed by pulmonology, as well as diastolic heart failure, CAD, DM, CKD IV, recently hospitalized from 10/16-10/29 with NSTEMI treated with heparin infusion, and respiratory failure secondary to CHF and COPD exacerbation who returns to the ED 2 days later with complaints of chest pain and shortness of breath. Chest pain was severe heaviness on his chest.EMS reports sats of 91% on his normal 4 L.  They attempted to increase it to 6 L with minimal improvement to 93% and and then put him on a nonrebreather and sats were 100%.  Wife contributes to history .    PT Comments    Pt very lethargic after receiving pain meds recently. Unable to open eyes with cues, O2 sats in upper 90's with HR at 47bpm. Nursing in and aware. Pt did however assist 50% with B LE's ROM exercises with cues for consistent participation. Pt however did not offer any verbal comments. Pt unable to tolerate functional mobility this visit, therefore pt repositioned to upright sitting with all needs met. Continue per POC   Recommendations for follow up therapy are one component of a multi-disciplinary discharge planning process, led by the attending physician.  Recommendations may be updated based on patient status, additional functional criteria and insurance authorization.  Follow Up Recommendations  Home health PT     Assistance Recommended at Discharge Frequent or constant Supervision/Assistance  Equipment Recommendations  None recommended by PT    Recommendations for Other Services       Precautions / Restrictions Precautions Precautions: Fall Restrictions Weight Bearing  Restrictions: No Other Position/Activity Restrictions: L UE elevated on pillow     Mobility  Bed Mobility                    Transfers                        Ambulation/Gait                   Stairs             Wheelchair Mobility    Modified Rankin (Stroke Patients Only)       Balance                                            Cognition Arousal/Alertness: Lethargic (Eyes closed throughout session) Behavior During Therapy:  (fatigued) Overall Cognitive Status: Impaired/Different from baseline Area of Impairment: Attention                               General Comments:  (Pt had received pain meds prior to PT, very lethargic, unable to keep eyes open or verbally respond to therapist)        Exercises General Exercises - Lower Extremity Ankle Circles/Pumps: AAROM;Both;15 reps Heel Slides: AAROM;Both;15 reps Hip ABduction/ADduction: AAROM;Both;15 reps Straight Leg Raises: AAROM;Both;10 reps Hip Flexion/Marching: AAROM;Both;15 reps Other Exercises Other Exercises:  (B hip IR/ER AAROM x 15)    General Comments General comments (skin integrity, edema,  etc.):  (Pt's wife at bedside, all questions and concerns addressed, pt repositioned to upright long sitting)      Pertinent Vitals/Pain Pain Assessment: No/denies pain    Home Living                          Prior Function            PT Goals (current goals can now be found in the care plan section) Acute Rehab PT Goals Patient Stated Goal: go home    Frequency    Min 2X/week      PT Plan Current plan remains appropriate    Co-evaluation              AM-PAC PT "6 Clicks" Mobility   Outcome Measure  Help needed turning from your back to your side while in a flat bed without using bedrails?: A Lot Help needed moving from lying on your back to sitting on the side of a flat bed without using bedrails?: A Lot Help  needed moving to and from a bed to a chair (including a wheelchair)?: A Lot Help needed standing up from a chair using your arms (e.g., wheelchair or bedside chair)?: A Lot Help needed to walk in hospital room?: A Lot Help needed climbing 3-5 steps with a railing? : Total 6 Click Score: 11    End of Session Equipment Utilized During Treatment: Oxygen Activity Tolerance: Patient limited by lethargy Patient left: in bed;with call bell/phone within reach;with bed alarm set;with family/visitor present Nurse Communication: Mobility status PT Visit Diagnosis: Other abnormalities of gait and mobility (R26.89);Muscle weakness (generalized) (M62.81);Difficulty in walking, not elsewhere classified (R26.2)     Time: 7867-6720 PT Time Calculation (min) (ACUTE ONLY): 10 min  Charges:  $Therapeutic Exercise: 8-22 mins                    Mikel Cella, PTA    Josie Dixon 05/10/2021, 5:38 PM

## 2021-05-10 NOTE — Plan of Care (Addendum)
  Problem: Safety: Goal: Ability to remain free from injury will improve Outcome: Progressing   Problem: Skin Integrity: Goal: Risk for impaired skin integrity will decrease Outcome: Not Progressing   Pt has a unstagable wound to sacrum.

## 2021-05-10 NOTE — Plan of Care (Signed)
  Problem: Education: Goal: Knowledge of General Education information will improve Description: Including pain rating scale, medication(s)/side effects and non-pharmacologic comfort measures Outcome: Progressing   Problem: Activity: Goal: Risk for activity intolerance will decrease Outcome: Not Progressing   

## 2021-05-10 NOTE — Progress Notes (Signed)
Occupational Therapy Treatment Patient Details Name: Stephen [name] MRN: [id number] DOB: 01-May-1946 Today's Date: 05/10/2021   History of present illness Stephen [name] is a 75 y.o. male with medical history significant for , interstitial lung disease, pulmonary hypertension, OSA with chronic respiratory failure on home O2 at 4L and Bipap, followed by pulmonology, as well as diastolic heart failure, CAD, DM, CKD IV, recently hospitalized from 10/16-10/29 with NSTEMI treated with heparin infusion, and respiratory failure secondary to CHF and COPD exacerbation who returns to the ED 2 days later with complaints of chest pain and shortness of breath. Chest pain was severe heaviness on his chest.EMS reports sats of 91% on his normal 4 L.  They attempted to increase it to 6 L with minimal improvement to 93% and and then put him on a nonrebreather and sats were 100%.  Wife contributes to history .   OT comments  Pt seen for OT tx this date. Wife present. Pt initially sleeping, wakes to gentle verbal and tactile cues. Pt's LUE noted to be poorly positioned. Pt/dtr instructed further in positioning for LUE with pillows as well as LUE ther ex/ROM to promote edema control and increased strength. Pt performed shoulder flexion x5, elbow flex/ext x10, and composite finger flexion x5 with cues for repetitions. Pt continues to benefit from skilled OT services. Wife denies opportunity to use hoyer lift at home due to coming straight back to the hospital. Lift training for pt/family would be beneficial in future sessions.    Recommendations for follow up therapy are one component of a multi-disciplinary discharge planning process, led by the attending physician.  Recommendations may be updated based on patient status, additional functional criteria and insurance authorization.    Follow Up Recommendations  Home health OT    Assistance Recommended at Discharge Frequent or constant Supervision/Assistance  Equipment  Recommendations  BSC/3in1;Tub/shower seat    Recommendations for Other Services      Precautions / Restrictions Precautions Precautions: Fall Restrictions Weight Bearing Restrictions: No Other Position/Activity Restrictions: still with lingering pain in L UE from thrombophlebitis       Mobility Bed Mobility               General bed mobility comments: deferred 2/2 wrist pain    Transfers                         Balance                                           ADL either performed or assessed with clinical judgement   ADL Overall ADL's : Needs assistance/impaired     Grooming: Set up;Wash/dry face;Wash/dry hands;Bed level                                      Extremity/Trunk Assessment              Vision       Perception     Praxis      Cognition Arousal/Alertness: Awake/alert Behavior During Therapy: WFL for tasks assessed/performed Overall Cognitive Status: Within Functional Limits for tasks assessed  General Comments: initially sleepy, wakes easily          Exercises Other Exercises Other Exercises: Pt/dtr instructed further in positioning for LUE with pillows as well as LUE ther ex/ROM to promote edema control and increased strength   Shoulder Instructions       General Comments      Pertinent Vitals/ Pain       Pain Assessment: 0-10 Pain Score: 7  Pain Location: L hand/wrist Pain Descriptors / Indicators: Grimacing;Guarding;Discomfort Pain Intervention(s): Limited activity within patient's tolerance;Monitored during session;Repositioned;Patient requesting pain meds-RN notified  Home Living                                          Prior Functioning/Environment              Frequency  Min 2X/week        Progress Toward Goals  OT Goals(current goals can now be found in the care plan section)  Progress towards  OT goals: OT to reassess next treatment  Acute Rehab OT Goals Patient Stated Goal: to go home OT Goal Formulation: With patient/family Time For Goal Achievement: 05/18/21 Potential to Achieve Goals: [place] Discharge plan remains appropriate;Frequency remains appropriate    Co-evaluation                 AM-PAC OT "6 Clicks" Daily Activity     Outcome Measure   Help from another person eating meals?: A Little Help from another person taking care of personal grooming?: A Little Help from another person toileting, which includes using toliet, bedpan, or urinal?: A Lot Help from another person bathing (including washing, rinsing, drying)?: A Lot Help from another person to put on and taking off regular upper body clothing?: A Lot Help from another person to put on and taking off regular lower body clothing?: A Lot 6 Click Score: 14    End of Session    OT Visit Diagnosis: Other abnormalities of gait and mobility (R26.89);Muscle weakness (generalized) (M62.81);Pain Pain - Right/Left: Left Pain - part of body: Arm   Activity Tolerance Patient limited by pain   Patient Left with call bell/phone within reach;with family/visitor present;in bed;with bed alarm set   Nurse Communication Patient requests pain meds        Time: 7124-5809 OT Time Calculation (min): 17 min  Charges: OT General Charges $OT Visit: 1 Visit OT Treatments $Therapeutic Exercise: 8-22 mins  Ardeth Perfect., MPH, MS, OTR/L ascom [phone] 05/10/21, 10:33 AM

## 2021-05-10 NOTE — Progress Notes (Signed)
PROGRESS NOTE    Stephen [name]  VOH:[id number] DOB: 1945/07/11 DOA: 04/07/2021 PCP: [name], MD   Chief Complain: Shortness of breath  Brief Narrative: Patient is a 75 year old male with history of chronic hypoxic respiratory failure on BiPAP at night, pulmonary hypertension, interstitial lung disease chronically on 4 L of oxygen, chronic diastolic congestive heart failure, coronary disease, diabetes type 2, CKD stage IV who presented here with complaints of shortness of breath/chest pain.  He was recently hospitalized here from 10/16-2010/29 and was managed for NSTEMI , acute respiratory secondary to CHF and COPD exacerbation.  Patient currently being managed for volume overload.  Cardiology, nephrology following here.  Currently on Lasix drip.  Assessment & Plan:   Active Problems:   Acute on chronic diastolic CHF (congestive heart failure) (HCC)   Elevated troponin   CHF (congestive heart failure) (HCC)   Unstable angina (HCC)   Pulmonary hypertension (HCC)   Acute on chronic respiratory failure with hypoxia and hypercapnia (HCC)   CKD (chronic kidney disease), stage IV (HCC)   Interstitial lung disease (HCC)   B12 deficiency   Pressure injury of skin   Acute on chronic diastolic congestive heart failure: Presented with volume overload, LE edema,elevated BNP.  Currently on Lasix drip.  He was on coreg,held because of bradycardia.  Nephrology added metolazone.  Acute on chronic hypoxic/hypercarbic respiratory failure: History of pulm hypertension, ILD.  On BiPAP chronically with 4 to 6 L of oxygen during sleep.  Continue supplemental oxygen as needed.  Currently on baseline requirement.  Monitor respiratory status. Chest x-ray done on 05/09/2019 showed perihilar opacities, he was also started on ceftriaxone to cover for PNA  NSTEMI: Presented with chest pain.  Recently admitted here and was managed for the NSTEMI.  Elevated troponin presentation.  Initially started on  heparin drip.  Currently on aspirin, Plavix, Imdur, Coreg, Lipitor.  Cardiology following.  Cardiology does not think left heart cath or possible PCI will  improve patient's symptoms.  AKI on CKD stage IV: Creatinine in the range of 3.  Currently on Lasix drip.  Nephrology following here.  AKI is thought to be secondary to ATN.  Nephrology is thinking about starting on dialysis.  Hypertension: Currently mildly hypertensive.  Monitor blood pressure  Anemia of chronic disease/iron deficiency: Iron studies shows iron of 17.  given a dose of IV iron.  Started on Epogen  Urine retention: Currently with Foley catheter.  Diabetes type 2: Currently on Levemir, sliding scale insulin.  Monitor blood sugars.  OSA: Continue CPAP  Hematuria: Likely secondary to traumatic catheterization.  We will continue to monitor.  Morbid obesity: BMI of 39.6  Constipation: Continue bowel regimen  Generalized weakness/deconditioning: As per wife patient has acute decline and has been bedbound since last few weeks.  We have requested for PT/OT evaluation        DVT prophylaxis:Lovenox Code Status: DNR Family Communication: Wife at bedside Status is: Inpatient  Remains inpatient appropriate because: Currently on IV diuresis.      Consultants: Nephrology, cardiology  Procedures: None  Antimicrobials:  Anti-infectives (From admission, onward)    Start     Dose/Rate Route Frequency Ordered Stop   05/08/21 2000  cefTRIAXone (ROCEPHIN) 1 g in sodium chloride 0.9 % 100 mL IVPB        1 g 200 mL/hr over 30 Minutes Intravenous Every 24 hours 05/08/21 1817     04/02/2021 1000  doxycycline (VIBRA-TABS) tablet 100 mg  100 mg Oral Every 12 hours 04/06/2021 0725 05/05/21 2123       Subjective:  Patient seen and examined at the bedside this morning.  Hemodynamically stable.  Denies any new complaints today.  Lying in bed, weak, bedbound.  Wife at the bedside.  They are waiting to talk to nephrology  about possible need of  dialysis     Objective: Vitals:   05/09/21 1931 05/09/21 2251 05/10/21 0310 05/10/21 0736  BP: (!) 128/46  (!) 135/56 (!) 154/57  Pulse: (!) 55 (!) 56 (!) 55 (!) 53  Resp: 16  15 16   Temp: 98.1 F (36.7 C)  98.5 F (36.9 C) 98.6 F (37 C)  TempSrc:    Oral  SpO2: 97% 97% 98% 97%  Weight:   117.5 kg   Height:        Intake/Output Summary (Last 24 hours) at 05/10/2021 0759 Last data filed at 05/10/2021 0737 Gross per 24 hour  Intake 240 ml  Output 2426 ml  Net -2186 ml   Filed Weights   05/07/21 0500 05/08/21 0500 05/10/21 0310  Weight: 116.1 kg 108.2 kg 117.5 kg    Examination:  G General exam: Morbidly obese, weak, lying on bed HEENT: PERRL Respiratory system: Diminished bilaterally, no wheezes or crackles Cardiovascular system: S1 & S2 heard, RRR.  Gastrointestinal system: Abdomen is nondistended, soft and nontender. Central nervous system: Alert and oriented Extremities: Bilateral lower extremity edema, no clubbing ,no cyanosis Skin: No rashes, no ulcers,no icterus    Data Reviewed: I have personally reviewed following labs and imaging studies  CBC: Recent Labs  Lab 05/05/21 0555 05/06/21 0530 05/07/21 0551 05/08/21 0801 05/10/21 0501  WBC 7.5  --  8.9 5.4 4.3  NEUTROABS  --   --   --   --  3.0  HGB 8.7* 9.2* 9.0* 9.0* 8.4*  HCT 27.9* 29.7* 29.8* 28.9* 27.2*  MCV 90.3  --  91.4 90.0 88.0  PLT 291  --  286 254 326   Basic Metabolic Panel: Recent Labs  Lab 05/05/21 0555 05/06/21 0530 05/07/21 0551 05/08/21 0801 05/09/21 0509 05/10/21 0501  NA 138 138 137 137 138 138  K 4.2 4.6 4.6 4.6 4.5 3.9  CL 100 101 98 100 100 98  CO2 30 29 32 30 31 30   GLUCOSE 132* 193* 216* 156* 132* 162*  BUN 69* 70* 73* 72* 80* 79*  CREATININE 3.55* 3.65* 3.44* 3.73* 3.70* 3.59*  CALCIUM 8.0* 8.2* 8.3* 8.2* 8.3* 8.0*  MG 2.1  --  2.2  --   --   --   PHOS 5.4*  --  4.3  --   --   --    GFR: Estimated Creatinine Clearance: 21.7 mL/min (A)  (by C-G formula based on SCr of 3.59 mg/dL (H)). Liver Function Tests: No results for input(s): AST, ALT, ALKPHOS, BILITOT, PROT, ALBUMIN in the last 168 hours. No results for input(s): LIPASE, AMYLASE in the last 168 hours. No results for input(s): AMMONIA in the last 168 hours. Coagulation Profile: No results for input(s): INR, PROTIME in the last 168 hours. Cardiac Enzymes: No results for input(s): CKTOTAL, CKMB, CKMBINDEX, TROPONINI in the last 168 hours. BNP (last 3 results) No results for input(s): PROBNP in the last 8760 hours. HbA1C: No results for input(s): HGBA1C in the last 72 hours. CBG: Recent Labs  Lab 05/09/21 0813 05/09/21 1116 05/09/21 1632 05/09/21 2031 05/10/21 0720  GLUCAP 120* 135* 133* 179* 140*   Lipid Profile: No results  for input(s): CHOL, HDL, LDLCALC, TRIG, CHOLHDL, LDLDIRECT in the last 72 hours. Thyroid Function Tests: No results for input(s): TSH, T4TOTAL, FREET4, T3FREE, THYROIDAB in the last 72 hours. Anemia Panel: Recent Labs    05/08/21 0928  VITAMINB12 1,904*  FOLATE 13.9  FERRITIN 166  TIBC 133*  IRON 17*  RETICCTPCT 1.9   Sepsis Labs: No results for input(s): PROCALCITON, LATICACIDVEN in the last 168 hours.  Recent Results (from the past 240 hour(s))  Resp Panel by RT-PCR (Flu A&B, Covid)     Status: None   Collection Time: 04/09/2021 10:46 AM  Result Value Ref Range Status   SARS Coronavirus 2 by RT PCR NEGATIVE NEGATIVE Final    Comment: (NOTE) SARS-CoV-2 target nucleic acids are NOT DETECTED.  The SARS-CoV-2 RNA is generally detectable in upper respiratory specimens during the acute phase of infection. The lowest concentration of SARS-CoV-2 viral copies this assay can detect is 138 copies/mL. A negative result does not preclude SARS-Cov-2 infection and should not be used as the sole basis for treatment or other patient management decisions. A negative result may occur with  improper specimen collection/handling, submission  of specimen other than nasopharyngeal swab, presence of viral mutation(s) within the areas targeted by this assay, and inadequate number of viral copies(<138 copies/mL). A negative result must be combined with clinical observations, patient history, and epidemiological information. The expected result is Negative.  Fact Sheet for Patients:  EntrepreneurPulse.com.au  Fact Sheet for Healthcare Providers:  IncredibleEmployment.be  This test is no t yet approved or cleared by the [place] FDA and  has been authorized for detection and/or diagnosis of SARS-CoV-2 by FDA under an Emergency Use Authorization (EUA). This EUA will remain  in effect (meaning this test can be used) for the duration of the COVID-19 declaration under Section 564(b)(1) of the Act, 21 U.S.C.section 360bbb-3(b)(1), unless the authorization is terminated  or revoked sooner.       Influenza A by PCR NEGATIVE NEGATIVE Final   Influenza B by PCR NEGATIVE NEGATIVE Final    Comment: (NOTE) The Xpert Xpress SARS-CoV-2/FLU/RSV plus assay is intended as an aid in the diagnosis of influenza from Nasopharyngeal swab specimens and should not be used as a sole basis for treatment. Nasal washings and aspirates are unacceptable for Xpert Xpress SARS-CoV-2/FLU/RSV testing.  Fact Sheet for Patients: EntrepreneurPulse.com.au  Fact Sheet for Healthcare Providers: IncredibleEmployment.be  This test is not yet approved or cleared by the [place] FDA and has been authorized for detection and/or diagnosis of SARS-CoV-2 by FDA under an Emergency Use Authorization (EUA). This EUA will remain in effect (meaning this test can be used) for the duration of the COVID-19 declaration under Section 564(b)(1) of the Act, 21 U.S.C. section 360bbb-3(b)(1), unless the authorization is terminated or revoked.  Performed at [facility], [address] [place], [place] [address]          Radiology Studies: US PELVIS (TRANSABDOMINAL ONLY)  Result Date: 05/08/2021 CLINICAL DATA:  Acute urinary retention EXAM: LIMITED ULTRASOUND OF PELVIS TECHNIQUE: Limited transabdominal ultrasound examination of the pelvis was performed. COMPARISON:  None. FINDINGS: A Foley catheter is seen within a decompressed bladder. IMPRESSION: A Foley catheter balloon is seen within a decompressed bladder. No other abnormalities. Electronically Signed   By: Dorise Bullion III M.D.   On: 05/08/2021 18:23   DG Chest Port 1 View  Result Date: 05/08/2021 CLINICAL DATA:  Shortness of breath. EXAM: PORTABLE CHEST 1 VIEW COMPARISON:  04/17/2021.  Chest  CT 04/05/2021 FINDINGS: Low lung volumes. Prominent heart size likely accentuated by technique and low lung volumes. Questionable retrocardiac opacity versus superimposition of soft tissue structures, evaluation is limited by soft tissue attenuation from habitus. Regardless, suspected left pleural effusion there is patchy airspace disease in both perihilar lungs. No pneumothorax. IMPRESSION: 1. Patchy airspace disease in both perihilar lungs, pneumonia or pulmonary edema. 2. Questionable retrocardiac opacity versus superimposition of soft tissue structures. Regardless, suspected left pleural effusion. Electronically Signed   By: Keith Rake M.D.   On: 05/08/2021 17:14        Scheduled Meds:  aspirin EC  81 mg Oral Daily   atorvastatin  80 mg Oral QHS   calcitRIOL  0.25 mcg Oral Daily   carvedilol  25 mg Oral BID WC   Chlorhexidine Gluconate Cloth  6 each Topical Daily   cloNIDine  0.1 mg Oral TID   clopidogrel  75 mg Oral Daily   dorzolamide-timolol  1 drop Both Eyes BID   enoxaparin (LOVENOX) injection  30 mg Subcutaneous Q24H   epoetin (EPOGEN/PROCRIT) injection  3,000 Units Subcutaneous Q14 Days   ezetimibe  10 mg Oral Daily   feeding supplement  237 mL Oral BID BM   fluticasone furoate-vilanterol  1 puff  Inhalation Daily   And   umeclidinium bromide  1 puff Inhalation Daily   gabapentin  100 mg Oral BID   hydrALAZINE  25 mg Oral TID   insulin aspart  0-20 Units Subcutaneous TID WC   insulin aspart  0-5 Units Subcutaneous QHS   insulin detemir  30 Units Subcutaneous QHS   isosorbide mononitrate  120 mg Oral Daily   levothyroxine  50 mcg Oral Q0600   montelukast  10 mg Oral Daily   pantoprazole  40 mg Oral Daily   sodium chloride flush  3 mL Intravenous Q12H   tamsulosin  0.4 mg Oral QHS   cyanocobalamin  1,000 mcg Oral Daily   Continuous Infusions:  sodium chloride 10 mL/hr at 05/09/21 0600   cefTRIAXone (ROCEPHIN)  IV Stopped (05/10/21 0701)   furosemide (LASIX) 200 mg in dextrose 5% 100 mL (2mg /mL) infusion 8 mg/hr (05/08/21 1608)     LOS: 7 days    Time spent: 25 mins.More than 50% of that time was spent in counseling and/or coordination of care.      Shelly Coss, MD [facility] P11/01/2021, 7:59 AM

## 2021-05-10 NOTE — Progress Notes (Signed)
[facility], [place] 05/10/21  Subjective:   LOS: 7 11/07 0701 - 11/08 0700 In: 240 [P.O.:240] Out: 2425 [Urine:2425] Patient known to our practice from outpatient follow-up.  He is followed by Dr. Holley Raring and was last seen on August 9. He has medical problems of longstanding diabetes, hypertension, history of nephrolithiasis, hyperlipidemia, coronary artery disease status post PTCA, pulmonary fibrosis on 4 L chronic oxygen, obstructive sleep apnea, history of right adrenalectomy, left renal artery stenosis status post stent, pulmonary hypertension  Patient seen sitting up in bed, wife at bedside Tolerating small meals with Ensure supplement Reports improvements with breathing  Weaned to 4L [place]  Creatinine slightly improved Recorded urine output of 2.4L in past 24 hours   Objective:  Vital signs in last 24 hours:  Temp:  [97.4 F (36.3 C)-98.6 F (37 C)] 98.6 F (37 C) (11/08 0736) Pulse Rate:  [50-56] 53 (11/08 0736) Resp:  [15-19] 19 (11/08 0800) BP: (128-154)/(46-57) 154/57 (11/08 0736) SpO2:  [92 %-98 %] 97 % (11/08 0736) Weight:  [117.5 kg] 117.5 kg (11/08 0310)  Weight change:  Filed Weights   05/07/21 0500 05/08/21 0500 05/10/21 0310  Weight: 116.1 kg 108.2 kg 117.5 kg    Intake/Output:    Intake/Output Summary (Last 24 hours) at 05/10/2021 1103 Last data filed at 05/10/2021 0930 Gross per 24 hour  Intake --  Output 2726 ml  Net -2726 ml      Physical Exam: General: Anxious, laying in the bed  HEENT Moist oral mucous membranes  Pulm/lungs Clear bilaterally, 4L O2 [place]  CVS/Heart Holosystolic murmur, no rub  Abdomen:  Soft, obese, nontender  Extremities: 1+ pitting edema bilaterally  Neurologic: Alert, oriented  Skin: Warm, dry  GU Foley catheter-hematuria       Basic Metabolic Panel:  Recent Labs  Lab 05/05/21 0555 05/06/21 0530 05/07/21 0551 05/08/21 0801 05/09/21 0509 05/10/21 0501  NA 138 138 137 137 138 138   K 4.2 4.6 4.6 4.6 4.5 3.9  CL 100 101 98 100 100 98  CO2 30 29 32 30 31 30   GLUCOSE 132* 193* 216* 156* 132* 162*  BUN 69* 70* 73* 72* 80* 79*  CREATININE 3.55* 3.65* 3.44* 3.73* 3.70* 3.59*  CALCIUM 8.0* 8.2* 8.3* 8.2* 8.3* 8.0*  MG 2.1  --  2.2  --   --   --   PHOS 5.4*  --  4.3  --   --   --       CBC: Recent Labs  Lab 05/05/21 0555 05/06/21 0530 05/07/21 0551 05/08/21 0801 05/10/21 0501  WBC 7.5  --  8.9 5.4 4.3  NEUTROABS  --   --   --   --  3.0  HGB 8.7* 9.2* 9.0* 9.0* 8.4*  HCT 27.9* 29.7* 29.8* 28.9* 27.2*  MCV 90.3  --  91.4 90.0 88.0  PLT 291  --  286 254 259       Lab Results  Component Value Date   HEPBSAG NON REACTIVE 05/09/2021      Microbiology:  Recent Results (from the past 240 hour(s))  Resp Panel by RT-PCR (Flu A&B, Covid)     Status: None   Collection Time: 04/19/2021 10:46 AM  Result Value Ref Range Status   SARS Coronavirus 2 by RT PCR NEGATIVE NEGATIVE Final    Comment: (NOTE) SARS-CoV-2 target nucleic acids are NOT DETECTED.  The SARS-CoV-2 RNA is generally detectable in upper respiratory specimens during the acute phase of infection. The lowest  concentration of SARS-CoV-2 viral copies this assay can detect is 138 copies/mL. A negative result does not preclude SARS-Cov-2 infection and should not be used as the sole basis for treatment or other patient management decisions. A negative result may occur with  improper specimen collection/handling, submission of specimen other than nasopharyngeal swab, presence of viral mutation(s) within the areas targeted by this assay, and inadequate number of viral copies(<138 copies/mL). A negative result must be combined with clinical observations, patient history, and epidemiological information. The expected result is Negative.  Fact Sheet for Patients:  EntrepreneurPulse.com.au  Fact Sheet for Healthcare Providers:  IncredibleEmployment.be  This test is  no t yet approved or cleared by the [place] FDA and  has been authorized for detection and/or diagnosis of SARS-CoV-2 by FDA under an Emergency Use Authorization (EUA). This EUA will remain  in effect (meaning this test can be used) for the duration of the COVID-19 declaration under Section 564(b)(1) of the Act, 21 U.S.C.section 360bbb-3(b)(1), unless the authorization is terminated  or revoked sooner.       Influenza A by PCR NEGATIVE NEGATIVE Final   Influenza B by PCR NEGATIVE NEGATIVE Final    Comment: (NOTE) The Xpert Xpress SARS-CoV-2/FLU/RSV plus assay is intended as an aid in the diagnosis of influenza from Nasopharyngeal swab specimens and should not be used as a sole basis for treatment. Nasal washings and aspirates are unacceptable for Xpert Xpress SARS-CoV-2/FLU/RSV testing.  Fact Sheet for Patients: EntrepreneurPulse.com.au  Fact Sheet for Healthcare Providers: IncredibleEmployment.be  This test is not yet approved or cleared by the [place] FDA and has been authorized for detection and/or diagnosis of SARS-CoV-2 by FDA under an Emergency Use Authorization (EUA). This EUA will remain in effect (meaning this test can be used) for the duration of the COVID-19 declaration under Section 564(b)(1) of the Act, 21 U.S.C. section 360bbb-3(b)(1), unless the authorization is terminated or revoked.  Performed at [facility], [address] [place], [place] [address]     Coagulation Studies: No results for input(s): LABPROT, INR in the last 72 hours.   Urinalysis: No results for input(s): COLORURINE, LABSPEC, PHURINE, GLUCOSEU, HGBUR, BILIRUBINUR, KETONESUR, PROTEINUR, UROBILINOGEN, NITRITE, LEUKOCYTESUR in the last 72 hours.  Invalid input(s): APPERANCEUR     Imaging: US PELVIS (TRANSABDOMINAL ONLY)  Result Date: 05/08/2021 CLINICAL DATA:  Acute urinary retention EXAM: LIMITED ULTRASOUND OF PELVIS  TECHNIQUE: Limited transabdominal ultrasound examination of the pelvis was performed. COMPARISON:  None. FINDINGS: A Foley catheter is seen within a decompressed bladder. IMPRESSION: A Foley catheter balloon is seen within a decompressed bladder. No other abnormalities. Electronically Signed   By: Dorise Bullion III M.D.   On: 05/08/2021 18:23   DG Chest Port 1 View  Result Date: 05/08/2021 CLINICAL DATA:  Shortness of breath. EXAM: PORTABLE CHEST 1 VIEW COMPARISON:  04/03/2021.  Chest CT 04/05/2021 FINDINGS: Low lung volumes. Prominent heart size likely accentuated by technique and low lung volumes. Questionable retrocardiac opacity versus superimposition of soft tissue structures, evaluation is limited by soft tissue attenuation from habitus. Regardless, suspected left pleural effusion there is patchy airspace disease in both perihilar lungs. No pneumothorax. IMPRESSION: 1. Patchy airspace disease in both perihilar lungs, pneumonia or pulmonary edema. 2. Questionable retrocardiac opacity versus superimposition of soft tissue structures. Regardless, suspected left pleural effusion. Electronically Signed   By: Keith Rake M.D.   On: 05/08/2021 17:14     Medications:    sodium chloride 10 mL/hr at 05/09/21 0600   cefTRIAXone (  ROCEPHIN)  IV Stopped (05/10/21 0701)   furosemide (LASIX) 200 mg in dextrose 5% 100 mL (2mg /mL) infusion 8 mg/hr (05/08/21 1608)     aspirin EC  81 mg Oral Daily   atorvastatin  80 mg Oral QHS   calcitRIOL  0.25 mcg Oral Daily   carvedilol  25 mg Oral BID WC   Chlorhexidine Gluconate Cloth  6 each Topical Daily   cloNIDine  0.1 mg Oral TID   clopidogrel  75 mg Oral Daily   dorzolamide-timolol  1 drop Both Eyes BID   enoxaparin (LOVENOX) injection  30 mg Subcutaneous Q24H   epoetin (EPOGEN/PROCRIT) injection  3,000 Units Subcutaneous Q14 Days   ezetimibe  10 mg Oral Daily   feeding supplement  237 mL Oral BID BM   fluticasone furoate-vilanterol  1 puff Inhalation  Daily   And   umeclidinium bromide  1 puff Inhalation Daily   gabapentin  100 mg Oral BID   hydrALAZINE  25 mg Oral TID   insulin aspart  0-20 Units Subcutaneous TID WC   insulin aspart  0-5 Units Subcutaneous QHS   insulin detemir  30 Units Subcutaneous QHS   isosorbide mononitrate  120 mg Oral Daily   levothyroxine  50 mcg Oral Q0600   montelukast  10 mg Oral Daily   pantoprazole  40 mg Oral Daily   sodium chloride flush  3 mL Intravenous Q12H   tamsulosin  0.4 mg Oral QHS   cyanocobalamin  1,000 mcg Oral Daily   sodium chloride, acetaminophen, albuterol, ALPRAZolam, HYDROcodone-acetaminophen, nitroGLYCERIN, ondansetron (ZOFRAN) IV, polyethylene glycol  Assessment/ Plan:  75 y.o. male with medical problems of longstanding diabetes, hypertension, history of nephrolithiasis, hyperlipidemia, coronary artery disease status post PTCA, pulmonary fibrosis on 4 L chronic oxygen, obstructive sleep apnea, history of right adrenalectomy, left renal artery stenosis status post stent, pulmonary hypertension   Active Problems:   Acute on chronic diastolic CHF (congestive heart failure) (HCC)   Elevated troponin   CHF (congestive heart failure) (HCC)   Unstable angina (HCC)   Pulmonary hypertension (HCC)   Acute on chronic respiratory failure with hypoxia and hypercapnia (HCC)   CKD (chronic kidney disease), stage IV (HCC)   Interstitial lung disease ([place])   B12 deficiency   Pressure injury of skin   #. CKD st 4 Recent Labs    05/07/21 0551 05/08/21 0801 05/09/21 0509 05/10/21 0501  CREATININE 3.44* 3.73* 3.70* 3.59*   Patient has underlying CKD stage IV.  CKD risk factors include diabetes type 2, hypertension, history of IV contrast exposure, atherosclerosis and age Baseline appears to be 2.66/GFR 25 from April 17, 2021 Foley catheter placed for obstruction, gross hematuria noted.  Metolazone 5 mg given once yesterday with adequate results, recorded urine output of 2.4 L in 24  hours.  Will defer dialysis at this time.  We will continue furosemide drip at this time and consider metolazone dosing 2-3 times weekly.  Creatinine slightly improved  #. Anemia of CKD  Lab Results  Component Value Date   HGB 8.4 (L) 05/10/2021   Hemoglobin below target, will consider EPO  #. SHPTH  No results found for: PTH Lab Results  Component Value Date   PHOS 4.3 05/07/2021   Outpatient PTH 182 from February 08, 2021 Calcium below target at 8.0 Calcitriol daily  #. HTN with CKD and lower extremity edema Blood pressure 136/52 Continue Lasix drip.  Metolazone prescribed yesterday with adequate results.  Improved lower extremity edema  #. Diabetes type  2 with CKD Hgb A1c MFr Bld (%)  Date Value  04/14/2021 7.5 (H)  Management as per primary team  #Pulmonary hypertension Right heart catheterization December 06, 2020 Pulmonary artery pressure of 44 mm, right ventricular pressure of 48 mm 2D echo from April 18, 2021.  LVEF 60 to 59%, grade 1 diastolic dysfunction, elevated left atrial pressure, moderate thickening of aortic valve, mild calcification of mitral valve.  Cardiology monitoring    LOS: 7 [facility] 11/8/202211:03 AM  [facility] [place], [place]

## 2021-05-11 ENCOUNTER — Other Ambulatory Visit: Payer: Self-pay | Admitting: Urology

## 2021-05-11 DIAGNOSIS — I214 Non-ST elevation (NSTEMI) myocardial infarction: Secondary | ICD-10-CM | POA: Diagnosis not present

## 2021-05-11 DIAGNOSIS — J9621 Acute and chronic respiratory failure with hypoxia: Secondary | ICD-10-CM | POA: Diagnosis not present

## 2021-05-11 DIAGNOSIS — R310 Gross hematuria: Secondary | ICD-10-CM

## 2021-05-11 DIAGNOSIS — J9622 Acute and chronic respiratory failure with hypercapnia: Secondary | ICD-10-CM | POA: Diagnosis not present

## 2021-05-11 DIAGNOSIS — I5033 Acute on chronic diastolic (congestive) heart failure: Secondary | ICD-10-CM | POA: Diagnosis not present

## 2021-05-11 DIAGNOSIS — R339 Retention of urine, unspecified: Secondary | ICD-10-CM

## 2021-05-11 LAB — RENAL FUNCTION PANEL
Albumin: 1.9 g/dL — ABNORMAL LOW (ref 3.5–5.0)
Anion gap: 8 (ref 5–15)
BUN: 85 mg/dL — ABNORMAL HIGH (ref 8–23)
CO2: 33 mmol/L — ABNORMAL HIGH (ref 22–32)
Calcium: 8 mg/dL — ABNORMAL LOW (ref 8.9–10.3)
Chloride: 96 mmol/L — ABNORMAL LOW (ref 98–111)
Creatinine, Ser: 3.64 mg/dL — ABNORMAL HIGH (ref 0.61–1.24)
GFR, Estimated: 17 mL/min — ABNORMAL LOW (ref >60)
Glucose, Bld: 215 mg/dL — ABNORMAL HIGH (ref 70–99)
Phosphorus: 5.5 mg/dL — ABNORMAL HIGH (ref 2.5–4.6)
Potassium: 3.5 mmol/L (ref 3.5–5.1)
Sodium: 137 mmol/L (ref 135–145)

## 2021-05-11 LAB — CBC WITH DIFFERENTIAL/PLATELET
Abs Immature Granulocytes: 0.01 10*3/uL (ref 0.00–0.07)
Basophils Absolute: 0 10*3/uL (ref 0.0–0.1)
Basophils Relative: 1 %
Eosinophils Absolute: 0.2 10*3/uL (ref 0.0–0.5)
Eosinophils Relative: 4 %
HCT: 27.6 % — ABNORMAL LOW (ref 39.0–52.0)
Hemoglobin: 8.4 g/dL — ABNORMAL LOW (ref 13.0–17.0)
Immature Granulocytes: 0 %
Lymphocytes Relative: 15 %
Lymphs Abs: 0.6 10*3/uL — ABNORMAL LOW (ref 0.7–4.0)
MCH: 27 pg (ref 26.0–34.0)
MCHC: 30.4 g/dL (ref 30.0–36.0)
MCV: 88.7 fL (ref 80.0–100.0)
Monocytes Absolute: 0.6 10*3/uL (ref 0.1–1.0)
Monocytes Relative: 14 %
Neutro Abs: 2.6 10*3/uL (ref 1.7–7.7)
Neutrophils Relative %: 66 %
Platelets: 246 10*3/uL (ref 150–400)
RBC: 3.11 MIL/uL — ABNORMAL LOW (ref 4.22–5.81)
RDW: 15.6 % — ABNORMAL HIGH (ref 11.5–15.5)
WBC: 3.9 10*3/uL — ABNORMAL LOW (ref 4.0–10.5)
nRBC: 0 % (ref 0.0–0.2)

## 2021-05-11 LAB — GLUCOSE, CAPILLARY
Glucose-Capillary: 164 mg/dL — ABNORMAL HIGH (ref 70–99)
Glucose-Capillary: 233 mg/dL — ABNORMAL HIGH (ref 70–99)
Glucose-Capillary: 264 mg/dL — ABNORMAL HIGH (ref 70–99)
Glucose-Capillary: 172 mg/dL — ABNORMAL HIGH (ref 70–99)

## 2021-05-11 LAB — HEPATITIS B SURFACE ANTIBODY, QUANTITATIVE: Hep B S AB Quant (Post): <3.1 m[IU]/mL — ABNORMAL LOW (ref >9.9)

## 2021-05-11 MED ORDER — HYDROCODONE-ACETAMINOPHEN 5-325 MG PO TABS
1.0000 | ORAL_TABLET | Freq: Four times a day (QID) | ORAL | Status: DC | PRN
  Administered 2021-05-11 – 2021-05-19 (×9): 1 via ORAL
  Filled 2021-05-11 (×10): qty 1

## 2021-05-11 MED ORDER — FUROSEMIDE 40 MG PO TABS
80.0000 mg | ORAL_TABLET | Freq: Two times a day (BID) | ORAL | Status: DC
  Administered 2021-05-11 – 2021-05-15 (×9): 80 mg via ORAL
  Filled 2021-05-11 (×9): qty 2

## 2021-05-11 NOTE — Progress Notes (Signed)
PROGRESS NOTE    RONAK DUQUETTE  BSW:[id number] DOB: July 18, 1945 DOA: 04/04/2021 PCP: [name], MD   Chief Complain: Shortness of breath  Brief Narrative: Patient is a 75 year old male with history of chronic hypoxic respiratory failure on BiPAP at night, pulmonary hypertension, interstitial lung disease chronically on 4 L of oxygen, chronic diastolic congestive heart failure, coronary disease, diabetes type 2, CKD stage IV who presented here with complaints of shortness of breath/chest pain.  He was recently hospitalized here from 10/16-2010/29 and was managed for NSTEMI , acute respiratory secondary to CHF and COPD exacerbation.  Patient currently being managed for volume overload.  Cardiology, nephrology following here.  Currently on Lasix drip.  Assessment & Plan:   Active Problems:   Acute on chronic diastolic CHF (congestive heart failure) (HCC)   Elevated troponin   CHF (congestive heart failure) (HCC)   Unstable angina (HCC)   Pulmonary hypertension (HCC)   Acute on chronic respiratory failure with hypoxia and hypercapnia (HCC)   CKD (chronic kidney disease), stage IV (HCC)   Interstitial lung disease (HCC)   B12 deficiency   Pressure injury of skin   Acute on chronic diastolic congestive heart failure: Presented with volume overload, LE edema,elevated BNP.  Currently on Lasix drip.  Nephrology added metolazone 2-3 times a week  Acute on chronic hypoxic/hypercarbic respiratory failure: History of pulm hypertension, ILD.  On BiPAP chronically with 4 to 6 L of oxygen during sleep.  Continue supplemental oxygen as needed.  Currently on baseline requirement.  Monitor respiratory status. Chest x-ray done on 05/09/2019 showed perihilar opacities, he was also started on ceftriaxone to cover for PNA,plan for 5 days course  NSTEMI: Presented with chest pain.  Recently admitted here and was managed for the NSTEMI.  Elevated troponin presentation.  Initially started on heparin  drip.  Currently on aspirin, Plavix, Imdur, Coreg, Lipitor.  Cardiology following.  Cardiology does not think left heart cath or possible PCI will  improve patient's symptoms.  AKI on CKD stage IV: Creatinine in the range of 3.  Currently on Lasix drip.  Nephrology following here.  AKI is thought to be secondary to ATN. There was discussion about potentially starting him on dialysis, currently the plan is on hold  Hypertension: Currently mildly hypertensive.  Monitor blood pressure  Anemia of chronic disease/iron deficiency: Iron studies shows iron of 17.  given a dose of IV iron.  Started on Epogen  Diabetes type 2: Currently on Levemir, sliding scale insulin.  Monitor blood sugars.  OSA: Continue CPAP  Hematuria/urinary retention: Likely secondary to traumatic catheterization.  Has history of BPH.  Urology consulted and recommended outpatient follow-up.  We  recommended to continue Foley for now, also on tamsulosin  Morbid obesity: BMI of 39.6  Constipation: Continue bowel regimen  Generalized weakness/deconditioning: As per wife patient has acute decline and has been bedbound since last few weeks.  We requested for PT/OT evaluation,recommended HH        DVT prophylaxis:Lovenox Code Status: DNR Family Communication: Wife at bedside Status is: Inpatient  Remains inpatient appropriate because: Currently on IV diuresis.      Consultants: Nephrology, cardiology  Procedures: None  Antimicrobials:  Anti-infectives (From admission, onward)    Start     Dose/Rate Route Frequency Ordered Stop   05/08/21 2000  cefTRIAXone (ROCEPHIN) 1 g in sodium chloride 0.9 % 100 mL IVPB        1 g 200 mL/hr over 30 Minutes Intravenous Every 24 hours 05/08/21  1817     04/05/2021 1000  doxycycline (VIBRA-TABS) tablet 100 mg        100 mg Oral Every 12 hours 04/02/2021 0725 05/05/21 2123       Subjective:  Patient seen and examined at the bedside this morning.  Hemodynamically stable.  Lying  on the bed.  Wife at the bedside.  Denies any new complaints today.  He states he is feeling better than yesterday.  He had a bowel movement today.  Foley catheter draining bloody urine     Objective: Vitals:   05/10/21 2000 05/11/21 0000 05/11/21 0412 05/11/21 0805  BP: (!) 173/59 (!) 165/56 (!) 156/55 (!) 159/56  Pulse: (!) 57 (!) 53 (!) 51 (!) 55  Resp: 20 18 14 18   Temp: 98.1 F (36.7 C) 98.2 F (36.8 C) 98.2 F (36.8 C) 97.8 F (36.6 C)  TempSrc: Oral Oral    SpO2: 99% 100% 99% 93%  Weight:      Height:        Intake/Output Summary (Last 24 hours) at 05/11/2021 0810 Last data filed at 05/11/2021 0645 Gross per 24 hour  Intake 120 ml  Output 3200 ml  Net -3080 ml   Filed Weights   05/07/21 0500 05/08/21 0500 05/10/21 0310  Weight: 116.1 kg 108.2 kg 117.5 kg    Examination:  General exam: Chronically ill looking, lying in bed, obese, weak HEENT: PERRL Respiratory system: Diminished air sounds bilaterally, no wheezes or crackles  Cardiovascular system: S1 & S2 heard, RRR.  Gastrointestinal system: Abdomen is nondistended, soft and nontender. Central nervous system: Alert and oriented Extremities: Bilateral lower extremity edema, no clubbing ,no cyanosis Skin: No rashes, no ulcers,no icterus   GU: Foley draining bloody urine  Data Reviewed: I have personally reviewed following labs and imaging studies  CBC: Recent Labs  Lab 05/05/21 0555 05/06/21 0530 05/07/21 0551 05/08/21 0801 05/10/21 0501 05/11/21 0422  WBC 7.5  --  8.9 5.4 4.3 3.9*  NEUTROABS  --   --   --   --  3.0 2.6  HGB 8.7* 9.2* 9.0* 9.0* 8.4* 8.4*  HCT 27.9* 29.7* 29.8* 28.9* 27.2* 27.6*  MCV 90.3  --  91.4 90.0 88.0 88.7  PLT 291  --  286 254 259 353   Basic Metabolic Panel: Recent Labs  Lab 05/05/21 0555 05/06/21 0530 05/07/21 0551 05/08/21 0801 05/09/21 0509 05/10/21 0501 05/11/21 0422  NA 138   < > 137 137 138 138 137  K 4.2   < > 4.6 4.6 4.5 3.9 3.5  CL 100   < > 98 100 100 98  96*  CO2 30   < > 32 30 31 30  33*  GLUCOSE 132*   < > 216* 156* 132* 162* 215*  BUN 69*   < > 73* 72* 80* 79* 85*  CREATININE 3.55*   < > 3.44* 3.73* 3.70* 3.59* 3.64*  CALCIUM 8.0*   < > 8.3* 8.2* 8.3* 8.0* 8.0*  MG 2.1  --  2.2  --   --   --   --   PHOS 5.4*  --  4.3  --   --   --  5.5*   < > = values in this interval not displayed.   GFR: Estimated Creatinine Clearance: 21.4 mL/min (A) (by C-G formula based on SCr of 3.64 mg/dL (H)). Liver Function Tests: Recent Labs  Lab 05/11/21 0422  ALBUMIN 1.9*   No results for input(s): LIPASE, AMYLASE in the last 168  hours. No results for input(s): AMMONIA in the last 168 hours. Coagulation Profile: No results for input(s): INR, PROTIME in the last 168 hours. Cardiac Enzymes: No results for input(s): CKTOTAL, CKMB, CKMBINDEX, TROPONINI in the last 168 hours. BNP (last 3 results) No results for input(s): PROBNP in the last 8760 hours. HbA1C: No results for input(s): HGBA1C in the last 72 hours. CBG: Recent Labs  Lab 05/10/21 0720 05/10/21 1129 05/10/21 1634 05/10/21 2030 05/11/21 0802  GLUCAP 140* 138* 151* 132* 164*   Lipid Profile: No results for input(s): CHOL, HDL, LDLCALC, TRIG, CHOLHDL, LDLDIRECT in the last 72 hours. Thyroid Function Tests: No results for input(s): TSH, T4TOTAL, FREET4, T3FREE, THYROIDAB in the last 72 hours. Anemia Panel: Recent Labs    05/08/21 0928  VITAMINB12 1,904*  FOLATE 13.9  FERRITIN 166  TIBC 133*  IRON 17*  RETICCTPCT 1.9   Sepsis Labs: No results for input(s): PROCALCITON, LATICACIDVEN in the last 168 hours.  Recent Results (from the past 240 hour(s))  Resp Panel by RT-PCR (Flu A&B, Covid)     Status: None   Collection Time: 04/12/2021 10:46 AM  Result Value Ref Range Status   SARS Coronavirus 2 by RT PCR NEGATIVE NEGATIVE Final    Comment: (NOTE) SARS-CoV-2 target nucleic acids are NOT DETECTED.  The SARS-CoV-2 RNA is generally detectable in upper respiratory specimens during  the acute phase of infection. The lowest concentration of SARS-CoV-2 viral copies this assay can detect is 138 copies/mL. A negative result does not preclude SARS-Cov-2 infection and should not be used as the sole basis for treatment or other patient management decisions. A negative result may occur with  improper specimen collection/handling, submission of specimen other than nasopharyngeal swab, presence of viral mutation(s) within the areas targeted by this assay, and inadequate number of viral copies(<138 copies/mL). A negative result must be combined with clinical observations, patient history, and epidemiological information. The expected result is Negative.  Fact Sheet for Patients:  EntrepreneurPulse.com.au  Fact Sheet for Healthcare Providers:  IncredibleEmployment.be  This test is no t yet approved or cleared by the [place] FDA and  has been authorized for detection and/or diagnosis of SARS-CoV-2 by FDA under an Emergency Use Authorization (EUA). This EUA will remain  in effect (meaning this test can be used) for the duration of the COVID-19 declaration under Section 564(b)(1) of the Act, 21 U.S.C.section 360bbb-3(b)(1), unless the authorization is terminated  or revoked sooner.       Influenza A by PCR NEGATIVE NEGATIVE Final   Influenza B by PCR NEGATIVE NEGATIVE Final    Comment: (NOTE) The Xpert Xpress SARS-CoV-2/FLU/RSV plus assay is intended as an aid in the diagnosis of influenza from Nasopharyngeal swab specimens and should not be used as a sole basis for treatment. Nasal washings and aspirates are unacceptable for Xpert Xpress SARS-CoV-2/FLU/RSV testing.  Fact Sheet for Patients: EntrepreneurPulse.com.au  Fact Sheet for Healthcare Providers: IncredibleEmployment.be  This test is not yet approved or cleared by the [place] FDA and has been authorized for detection and/or  diagnosis of SARS-CoV-2 by FDA under an Emergency Use Authorization (EUA). This EUA will remain in effect (meaning this test can be used) for the duration of the COVID-19 declaration under Section 564(b)(1) of the Act, 21 U.S.C. section 360bbb-3(b)(1), unless the authorization is terminated or revoked.  Performed at [facility], [address], [place], [place] [address]          Radiology Studies: No results found.  Scheduled Meds:  aspirin EC  81 mg Oral Daily   atorvastatin  80 mg Oral QHS   calcitRIOL  0.25 mcg Oral Daily   carvedilol  25 mg Oral BID WC   Chlorhexidine Gluconate Cloth  6 each Topical Daily   cloNIDine  0.1 mg Oral TID   clopidogrel  75 mg Oral Daily   dorzolamide-timolol  1 drop Both Eyes BID   enoxaparin (LOVENOX) injection  30 mg Subcutaneous Q24H   epoetin (EPOGEN/PROCRIT) injection  3,000 Units Subcutaneous Q14 Days   ezetimibe  10 mg Oral Daily   feeding supplement  237 mL Oral BID BM   fluticasone furoate-vilanterol  1 puff Inhalation Daily   And   umeclidinium bromide  1 puff Inhalation Daily   gabapentin  100 mg Oral BID   hydrALAZINE  25 mg Oral TID   insulin aspart  0-20 Units Subcutaneous TID WC   insulin aspart  0-5 Units Subcutaneous QHS   insulin detemir  30 Units Subcutaneous QHS   isosorbide mononitrate  120 mg Oral Daily   levothyroxine  50 mcg Oral Q0600   montelukast  10 mg Oral Daily   pantoprazole  40 mg Oral Daily   sodium chloride flush  3 mL Intravenous Q12H   tamsulosin  0.4 mg Oral QHS   cyanocobalamin  1,000 mcg Oral Daily   Continuous Infusions:  sodium chloride 10 mL/hr at 05/09/21 0600   cefTRIAXone (ROCEPHIN)  IV 1 g (05/10/21 2233)   furosemide (LASIX) 200 mg in dextrose 5% 100 mL (2mg /mL) infusion 8 mg/hr (05/10/21 0400)     LOS: 8 days    Time spent: 25 mins.More than 50% of that time was spent in counseling and/or coordination of care.      Shelly Coss, MD [facility] P11/03/2021, 8:10 AM

## 2021-05-11 NOTE — Progress Notes (Signed)
[facility], [place] 05/11/21  Subjective:   LOS: 8 11/08 0701 - 11/09 0700 In: 120 [P.O.:120] Out: 6433 [Urine:3600; Stool:1] Patient known to our practice from outpatient follow-up.  He is followed by Dr. Holley Raring and was last seen on August 9. He has medical problems of longstanding diabetes, hypertension, history of nephrolithiasis, hyperlipidemia, coronary artery disease status post PTCA, pulmonary fibrosis on 4 L chronic oxygen, obstructive sleep apnea, history of right adrenalectomy, left renal artery stenosis status post stent, pulmonary hypertension  Patient seen sitting up in bed Wife at bedside Appetite improving, denies nausea and vomiting Improved respiratory status  Creatinine stable Recorded urine output of 3.6L in past 24 hours   Objective:  Vital signs in last 24 hours:  Temp:  [97.7 F (36.5 C)-98.2 F (36.8 C)] 97.7 F (36.5 C) (11/09 1551) Pulse Rate:  [51-57] 56 (11/09 1551) Resp:  [14-20] 17 (11/09 1551) BP: (146-173)/(53-59) 149/53 (11/09 1551) SpO2:  [93 %-100 %] 96 % (11/09 1551) Weight:  [110.3 kg] 110.3 kg (11/09 1021)  Weight change:  Filed Weights   05/08/21 0500 05/10/21 0310 05/11/21 1021  Weight: 108.2 kg 117.5 kg 110.3 kg    Intake/Output:    Intake/Output Summary (Last 24 hours) at 05/11/2021 1840 Last data filed at 05/11/2021 1550 Gross per 24 hour  Intake 240 ml  Output 3600 ml  Net -3360 ml      Physical Exam: General: Anxious, laying in the bed  HEENT Moist oral mucous membranes  Pulm/lungs Clear bilaterally, 4L O2 [place]  CVS/Heart Holosystolic murmur, no rub  Abdomen:  Soft, obese, nontender  Extremities: 1+ pitting edema bilaterally  Neurologic: Alert, oriented  Skin: Warm, dry  GU Foley catheter-hematuria       Basic Metabolic Panel:  Recent Labs  Lab 05/05/21 0555 05/06/21 0530 05/07/21 0551 05/08/21 0801 05/09/21 0509 05/10/21 0501 05/11/21 0422  NA 138   < > 137 137 138 138 137   K 4.2   < > 4.6 4.6 4.5 3.9 3.5  CL 100   < > 98 100 100 98 96*  CO2 30   < > 32 30 31 30  33*  GLUCOSE 132*   < > 216* 156* 132* 162* 215*  BUN 69*   < > 73* 72* 80* 79* 85*  CREATININE 3.55*   < > 3.44* 3.73* 3.70* 3.59* 3.64*  CALCIUM 8.0*   < > 8.3* 8.2* 8.3* 8.0* 8.0*  MG 2.1  --  2.2  --   --   --   --   PHOS 5.4*  --  4.3  --   --   --  5.5*   < > = values in this interval not displayed.      CBC: Recent Labs  Lab 05/05/21 0555 05/06/21 0530 05/07/21 0551 05/08/21 0801 05/10/21 0501 05/11/21 0422  WBC 7.5  --  8.9 5.4 4.3 3.9*  NEUTROABS  --   --   --   --  3.0 2.6  HGB 8.7* 9.2* 9.0* 9.0* 8.4* 8.4*  HCT 27.9* 29.7* 29.8* 28.9* 27.2* 27.6*  MCV 90.3  --  91.4 90.0 88.0 88.7  PLT 291  --  286 254 259 246       Lab Results  Component Value Date   HEPBSAG NON REACTIVE 05/09/2021      Microbiology:  Recent Results (from the past 240 hour(s))  Resp Panel by RT-PCR (Flu A&B, Covid)     Status: None   Collection  Time: 04/04/2021 10:46 AM  Result Value Ref Range Status   SARS Coronavirus 2 by RT PCR NEGATIVE NEGATIVE Final    Comment: (NOTE) SARS-CoV-2 target nucleic acids are NOT DETECTED.  The SARS-CoV-2 RNA is generally detectable in upper respiratory specimens during the acute phase of infection. The lowest concentration of SARS-CoV-2 viral copies this assay can detect is 138 copies/mL. A negative result does not preclude SARS-Cov-2 infection and should not be used as the sole basis for treatment or other patient management decisions. A negative result may occur with  improper specimen collection/handling, submission of specimen other than nasopharyngeal swab, presence of viral mutation(s) within the areas targeted by this assay, and inadequate number of viral copies(<138 copies/mL). A negative result must be combined with clinical observations, patient history, and epidemiological information. The expected result is Negative.  Fact Sheet for Patients:   EntrepreneurPulse.com.au  Fact Sheet for Healthcare Providers:  IncredibleEmployment.be  This test is no t yet approved or cleared by the [place] FDA and  has been authorized for detection and/or diagnosis of SARS-CoV-2 by FDA under an Emergency Use Authorization (EUA). This EUA will remain  in effect (meaning this test can be used) for the duration of the COVID-19 declaration under Section 564(b)(1) of the Act, 21 U.S.C.section 360bbb-3(b)(1), unless the authorization is terminated  or revoked sooner.       Influenza A by PCR NEGATIVE NEGATIVE Final   Influenza B by PCR NEGATIVE NEGATIVE Final    Comment: (NOTE) The Xpert Xpress SARS-CoV-2/FLU/RSV plus assay is intended as an aid in the diagnosis of influenza from Nasopharyngeal swab specimens and should not be used as a sole basis for treatment. Nasal washings and aspirates are unacceptable for Xpert Xpress SARS-CoV-2/FLU/RSV testing.  Fact Sheet for Patients: EntrepreneurPulse.com.au  Fact Sheet for Healthcare Providers: IncredibleEmployment.be  This test is not yet approved or cleared by the [place] FDA and has been authorized for detection and/or diagnosis of SARS-CoV-2 by FDA under an Emergency Use Authorization (EUA). This EUA will remain in effect (meaning this test can be used) for the duration of the COVID-19 declaration under Section 564(b)(1) of the Act, 21 U.S.C. section 360bbb-3(b)(1), unless the authorization is terminated or revoked.  Performed at [facility], [address] [place], [place] [address]     Coagulation Studies: No results for input(s): LABPROT, INR in the last 72 hours.   Urinalysis: No results for input(s): COLORURINE, LABSPEC, PHURINE, GLUCOSEU, HGBUR, BILIRUBINUR, KETONESUR, PROTEINUR, UROBILINOGEN, NITRITE, LEUKOCYTESUR in the last 72 hours.  Invalid input(s): APPERANCEUR      Imaging: No results found.   Medications:    sodium chloride 10 mL/hr at 05/09/21 0600   cefTRIAXone (ROCEPHIN)  IV 1 g (05/10/21 2233)   furosemide (LASIX) 200 mg in dextrose 5% 100 mL (2mg /mL) infusion 8 mg/hr (05/11/21 0835)     aspirin EC  81 mg Oral Daily   atorvastatin  80 mg Oral QHS   calcitRIOL  0.25 mcg Oral Daily   carvedilol  25 mg Oral BID WC   Chlorhexidine Gluconate Cloth  6 each Topical Daily   cloNIDine  0.1 mg Oral TID   clopidogrel  75 mg Oral Daily   dorzolamide-timolol  1 drop Both Eyes BID   enoxaparin (LOVENOX) injection  30 mg Subcutaneous Q24H   epoetin (EPOGEN/PROCRIT) injection  3,000 Units Subcutaneous Q14 Days   ezetimibe  10 mg Oral Daily   feeding supplement  237 mL Oral BID BM   fluticasone furoate-vilanterol  1 puff Inhalation Daily   And   umeclidinium bromide  1 puff Inhalation Daily   gabapentin  100 mg Oral BID   hydrALAZINE  25 mg Oral TID   insulin aspart  0-20 Units Subcutaneous TID WC   insulin aspart  0-5 Units Subcutaneous QHS   insulin detemir  30 Units Subcutaneous QHS   isosorbide mononitrate  120 mg Oral Daily   levothyroxine  50 mcg Oral Q0600   montelukast  10 mg Oral Daily   pantoprazole  40 mg Oral Daily   sodium chloride flush  3 mL Intravenous Q12H   tamsulosin  0.4 mg Oral QHS   cyanocobalamin  1,000 mcg Oral Daily   sodium chloride, acetaminophen, albuterol, ALPRAZolam, HYDROcodone-acetaminophen, nitroGLYCERIN, ondansetron (ZOFRAN) IV, polyethylene glycol  Assessment/ Plan:  75 y.o. male with medical problems of longstanding diabetes, hypertension, history of nephrolithiasis, hyperlipidemia, coronary artery disease status post PTCA, pulmonary fibrosis on 4 L chronic oxygen, obstructive sleep apnea, history of right adrenalectomy, left renal artery stenosis status post stent, pulmonary hypertension   Active Problems:   Acute on chronic diastolic CHF (congestive heart failure) (HCC)   Elevated troponin   CHF  (congestive heart failure) (HCC)   Unstable angina (HCC)   Pulmonary hypertension (HCC)   Acute on chronic respiratory failure with hypoxia and hypercapnia (HCC)   CKD (chronic kidney disease), stage IV (HCC)   Interstitial lung disease ([place])   B12 deficiency   Pressure injury of skin   #. CKD st 4 Recent Labs    05/08/21 0801 05/09/21 0509 05/10/21 0501 05/11/21 0422  CREATININE 3.73* 3.70* 3.59* 3.64*   Patient has underlying CKD stage IV.  CKD risk factors include diabetes type 2, hypertension, history of IV contrast exposure, atherosclerosis and age Baseline appears to be 2.66/GFR 25 from April 17, 2021 Foley catheter placed for obstruction, hematuria improving Creatinine remains stable. Will discontinue Furosemide drip and transition to Furosemide 80mg  po twice daily. Will access effectiveness of this. Patient will need follow up with nephrology 1-2 weeks after discharge.  #. Anemia of CKD  Lab Results  Component Value Date   HGB 8.4 (L) 05/11/2021   Hemoglobin remains below target  #. SHPTH  No results found for: PTH Lab Results  Component Value Date   PHOS 5.5 (H) 05/11/2021   Outpatient PTH 182 from February 08, 2021 Calcium below target at 8.0 Calcitriol daily  #. HTN with CKD and lower extremity edema Blood pressure 149/53 Transitioned to po Furosemide as above Recommend Metolazone 2-3 times per week for fluid management.   #. Diabetes type 2 with CKD Hgb A1c MFr Bld (%)  Date Value  04/18/2021 7.5 (H)  Management as per primary team  #Pulmonary hypertension Right heart catheterization December 06, 2020 Pulmonary artery pressure of 44 mm, right ventricular pressure of 48 mm 2D echo from April 18, 2021.  LVEF 60 to 54%, grade 1 diastolic dysfunction, elevated left atrial pressure, moderate thickening of aortic valve, mild calcification of mitral valve.  Cardiology monitoring    LOS: 8    11/9/20226:40 PM  [facility] [place], [place]

## 2021-05-11 NOTE — Progress Notes (Signed)
Summary Stephen [name] 11/9:comorbid,gross heme, retention,Foley in place,irrigated clear, outpatient work up for hematuria        To Do   Labs Labs: WBC/Hgb/Hct/Plts:  3.9/8.4/27.6/246 (11/09 0422) BUN/Cr/glu/ALT/AST/amyl/lip:  85/3.64/--/--/--/--/-- (11/09 0422)   Na/K/Cl/CO2:  137/3.5/96/33 (11/09 0422) Recent Results (from the past 720 hour(s))  Resp Panel by RT-PCR (Flu A&B, Covid) Nasopharyngeal Swab     Status: None   Collection Time: 04/17/21  8:58 PM   Specimen: Nasopharyngeal Swab; Nasopharyngeal(NP) swabs in vial transport medium  Result Value Ref Range Status   SARS Coronavirus 2 by RT PCR NEGATIVE NEGATIVE Final    Comment: (NOTE) SARS-CoV-2 target nucleic acids are NOT DETECTED.  The SARS-CoV-2 RNA is generally detectable in upper respiratory specimens during the acute phase of infection. The lowest concentration of SARS-CoV-2 viral copies this assay can detect is 138 copies/mL. A negative result does not preclude SARS-Cov-2 infection and should not be used as the sole basis for treatment or other patient management decisions. A negative result may occur with  improper specimen collection/handling, submission of specimen other than nasopharyngeal swab, presence of viral mutation(s) within the areas targeted by this assay, and inadequate number of viral copies(<138 copies/mL). A negative result must be combined with clinical observations, patient history, and epidemiological information. The expected result is Negative.  Fact Sheet for Patients:  EntrepreneurPulse.com.au  Fact Sheet for Healthcare Providers:  IncredibleEmployment.be  This test is no t yet approved or cleared by the [place] FDA and  has been authorized for detection and/or diagnosis of SARS-CoV-2 by FDA under an Emergency Use Authorization (EUA). This EUA will remain  in effect (meaning this test can be used) for the duration of the COVID-19  declaration under Section 564(b)(1) of the Act, 21 U.S.C.section 360bbb-3(b)(1), unless the authorization is terminated  or revoked sooner.       Influenza A by PCR NEGATIVE NEGATIVE Final   Influenza B by PCR NEGATIVE NEGATIVE Final    Comment: (NOTE) The Xpert Xpress SARS-CoV-2/FLU/RSV plus assay is intended as an aid in the diagnosis of influenza from Nasopharyngeal swab specimens and should not be used as a sole basis for treatment. Nasal washings and aspirates are unacceptable for Xpert Xpress SARS-CoV-2/FLU/RSV testing.  Fact Sheet for Patients: EntrepreneurPulse.com.au  Fact Sheet for Healthcare Providers: IncredibleEmployment.be  This test is not yet approved or cleared by the [place] FDA and has been authorized for detection and/or diagnosis of SARS-CoV-2 by FDA under an Emergency Use Authorization (EUA). This EUA will remain in effect (meaning this test can be used) for the duration of the COVID-19 declaration under Section 564(b)(1) of the Act, 21 U.S.C. section 360bbb-3(b)(1), unless the authorization is terminated or revoked.  Performed at [facility], [address] [place], [place] [address]   Urine Culture     Status: Abnormal   Collection Time: 04/22/21 11:53 AM   Specimen: Urine, Clean Catch  Result Value Ref Range Status   Specimen Description   Final    URINE, CLEAN CATCH Performed at [facility], [address], [place], [place] [address]    Special Requests   Final    NONE Performed at [facility], [place], [place] [address]    Culture (A)  Final    50,000 COLONIES/mL ESCHERICHIA COLI >=100,000 COLONIES/mL SERRATIA MARCESCENS    Report Status 04/25/2021 FINAL  Final   Organism ID, Bacteria ESCHERICHIA COLI (A)  Final   Organism ID, Bacteria SERRATIA MARCESCENS (A)  Final  Susceptibility   Escherichia coli - MIC*    AMPICILLIN 4 SENSITIVE Sensitive      CEFAZOLIN <=4 SENSITIVE Sensitive     CEFEPIME <=0.12 SENSITIVE Sensitive     CEFTRIAXONE <=0.25 SENSITIVE Sensitive     CIPROFLOXACIN <=0.25 SENSITIVE Sensitive     GENTAMICIN <=1 SENSITIVE Sensitive     IMIPENEM <=0.25 SENSITIVE Sensitive     NITROFURANTOIN <=16 SENSITIVE Sensitive     TRIMETH/SULFA <=20 SENSITIVE Sensitive     AMPICILLIN/SULBACTAM <=2 SENSITIVE Sensitive     PIP/TAZO <=4 SENSITIVE Sensitive     * 50,000 COLONIES/mL ESCHERICHIA COLI   Serratia marcescens - MIC*    CEFAZOLIN >=64 RESISTANT Resistant     CEFEPIME <=0.12 SENSITIVE Sensitive     CEFTRIAXONE <=0.25 SENSITIVE Sensitive     CIPROFLOXACIN <=0.25 SENSITIVE Sensitive     GENTAMICIN <=1 SENSITIVE Sensitive     NITROFURANTOIN 128 RESISTANT Resistant     TRIMETH/SULFA <=20 SENSITIVE Sensitive     * >=100,000 COLONIES/mL SERRATIA MARCESCENS  CULTURE, BLOOD (ROUTINE X 2) w Reflex to ID Panel     Status: None   Collection Time: 04/25/21  3:33 PM   Specimen: BLOOD  Result Value Ref Range Status   Specimen Description BLOOD BLOOD RIGHT WRIST  Final   Special Requests   Final    BOTTLES DRAWN AEROBIC AND ANAEROBIC Blood Culture adequate volume   Culture   Final    NO GROWTH 5 DAYS Performed at [facility], [address] [place], [place] [address]    Report Status 04/30/2021 FINAL  Final  CULTURE, BLOOD (ROUTINE X 2) w Reflex to ID Panel     Status: None   Collection Time: 04/25/21  3:38 PM   Specimen: BLOOD  Result Value Ref Range Status   Specimen Description BLOOD BLOOD RIGHT HAND  Final   Special Requests   Final    BOTTLES DRAWN AEROBIC AND ANAEROBIC Blood Culture adequate volume   Culture   Final    NO GROWTH 5 DAYS Performed at [facility], [address] [place], [place] [address]    Report Status 04/30/2021 FINAL  Final  Resp Panel by RT-PCR (Flu A&B, Covid)     Status: None   Collection Time: 04/19/2021 10:46 AM  Result Value Ref Range Status   SARS Coronavirus 2 by  RT PCR NEGATIVE NEGATIVE Final    Comment: (NOTE) SARS-CoV-2 target nucleic acids are NOT DETECTED.  The SARS-CoV-2 RNA is generally detectable in upper respiratory specimens during the acute phase of infection. The lowest concentration of SARS-CoV-2 viral copies this assay can detect is 138 copies/mL. A negative result does not preclude SARS-Cov-2 infection and should not be used as the sole basis for treatment or other patient management decisions. A negative result may occur with  improper specimen collection/handling, submission of specimen other than nasopharyngeal swab, presence of viral mutation(s) within the areas targeted by this assay, and inadequate number of viral copies(<138 copies/mL). A negative result must be combined with clinical observations, patient history, and epidemiological information. The expected result is Negative.  Fact Sheet for Patients:  EntrepreneurPulse.com.au  Fact Sheet for Healthcare Providers:  IncredibleEmployment.be  This test is no t yet approved or cleared by the [place] FDA and  has been authorized for detection and/or diagnosis of SARS-CoV-2 by FDA under an Emergency Use Authorization (EUA). This EUA will remain  in effect (meaning this test can be used) for the duration of the COVID-19 declaration under  Section 564(b)(1) of the Act, 21 U.S.C.section 360bbb-3(b)(1), unless the authorization is terminated  or revoked sooner.       Influenza A by PCR NEGATIVE NEGATIVE Final   Influenza B by PCR NEGATIVE NEGATIVE Final    Comment: (NOTE) The Xpert Xpress SARS-CoV-2/FLU/RSV plus assay is intended as an aid in the diagnosis of influenza from Nasopharyngeal swab specimens and should not be used as a sole basis for treatment. Nasal washings and aspirates are unacceptable for Xpert Xpress SARS-CoV-2/FLU/RSV testing.  Fact Sheet for Patients: EntrepreneurPulse.com.au  Fact Sheet  for Healthcare Providers: IncredibleEmployment.be  This test is not yet approved or cleared by the [place] FDA and has been authorized for detection and/or diagnosis of SARS-CoV-2 by FDA under an Emergency Use Authorization (EUA). This EUA will remain in effect (meaning this test can be used) for the duration of the COVID-19 declaration under Section 564(b)(1) of the Act, 21 U.S.C. section 360bbb-3(b)(1), unless the authorization is terminated or revoked.  Performed at [facility], [address] [place], [place] [address]         Medications Medications:  aspirin EC  81 mg Oral Daily   atorvastatin  80 mg Oral QHS   calcitRIOL  0.25 mcg Oral Daily   carvedilol  25 mg Oral BID WC   Chlorhexidine Gluconate Cloth  6 each Topical Daily   cloNIDine  0.1 mg Oral TID   clopidogrel  75 mg Oral Daily   dorzolamide-timolol  1 drop Both Eyes BID   enoxaparin (LOVENOX) injection  30 mg Subcutaneous Q24H   epoetin (EPOGEN/PROCRIT) injection  3,000 Units Subcutaneous Q14 Days   ezetimibe  10 mg Oral Daily   feeding supplement  237 mL Oral BID BM   fluticasone furoate-vilanterol  1 puff Inhalation Daily   And   umeclidinium bromide  1 puff Inhalation Daily   gabapentin  100 mg Oral BID   hydrALAZINE  25 mg Oral TID   insulin aspart  0-20 Units Subcutaneous TID WC   insulin aspart  0-5 Units Subcutaneous QHS   insulin detemir  30 Units Subcutaneous QHS   isosorbide mononitrate  120 mg Oral Daily   levothyroxine  50 mcg Oral Q0600   montelukast  10 mg Oral Daily   pantoprazole  40 mg Oral Daily   sodium chloride flush  3 mL Intravenous Q12H   tamsulosin  0.4 mg Oral QHS   cyanocobalamin  1,000 mcg Oral Daily   sodium chloride, acetaminophen, albuterol, ALPRAZolam, HYDROcodone-acetaminophen, nitroGLYCERIN, ondansetron (ZOFRAN) IV, polyethylene glycol Allergies: Calcium channel blockers

## 2021-05-11 NOTE — Progress Notes (Signed)
PT Cancellation Note  Patient Details Name: Stephen [name] MRN: [id number] DOB: 1946-02-11   Cancelled Treatment:     PT attempt. Pt/spouse/and RN tech in room upon arriving. Patient endorses severe fatigue requesting author return in AM for PT session. Per request, Pryor Curia will return in morning and continue to progress pt to PLOF.    Willette Pa 05/11/2021, 4:10 PM

## 2021-05-11 NOTE — Progress Notes (Signed)
Per Dr. Tawanna Solo hold evening dose of coreg 25mg  po for heart rate of 52. Will continue to monitor

## 2021-05-11 NOTE — Progress Notes (Signed)
Progress Note  Patient Name: Stephen [name] Date of Encounter: 05/11/2021  [facility] Cardiologist: Ida Rogue, MD   Subjective   Had a good night. Tolerated BiPAP well.  Less short of breath today.  No chest pain.He remains on furosemide drip.  Documented urine output 3.4 L for the past 24 hours and is net -10.2 liters for the admission.  Renal function is largely stable.  Inpatient Medications    Scheduled Meds:  aspirin EC  81 mg Oral Daily   atorvastatin  80 mg Oral QHS   calcitRIOL  0.25 mcg Oral Daily   carvedilol  25 mg Oral BID WC   Chlorhexidine Gluconate Cloth  6 each Topical Daily   cloNIDine  0.1 mg Oral TID   clopidogrel  75 mg Oral Daily   dorzolamide-timolol  1 drop Both Eyes BID   enoxaparin (LOVENOX) injection  30 mg Subcutaneous Q24H   epoetin (EPOGEN/PROCRIT) injection  3,000 Units Subcutaneous Q14 Days   ezetimibe  10 mg Oral Daily   feeding supplement  237 mL Oral BID BM   fluticasone furoate-vilanterol  1 puff Inhalation Daily   And   umeclidinium bromide  1 puff Inhalation Daily   gabapentin  100 mg Oral BID   hydrALAZINE  25 mg Oral TID   insulin aspart  0-20 Units Subcutaneous TID WC   insulin aspart  0-5 Units Subcutaneous QHS   insulin detemir  30 Units Subcutaneous QHS   isosorbide mononitrate  120 mg Oral Daily   levothyroxine  50 mcg Oral Q0600   montelukast  10 mg Oral Daily   pantoprazole  40 mg Oral Daily   sodium chloride flush  3 mL Intravenous Q12H   tamsulosin  0.4 mg Oral QHS   cyanocobalamin  1,000 mcg Oral Daily   Continuous Infusions:  sodium chloride 10 mL/hr at 05/09/21 0600   cefTRIAXone (ROCEPHIN)  IV 1 g (05/10/21 2233)   furosemide (LASIX) 200 mg in dextrose 5% 100 mL (2mg /mL) infusion 8 mg/hr (05/10/21 0400)   PRN Meds: sodium chloride, acetaminophen, albuterol, ALPRAZolam, HYDROcodone-acetaminophen, nitroGLYCERIN, ondansetron (ZOFRAN) IV, polyethylene glycol   Vital Signs    Vitals:   05/10/21 1959  05/10/21 2000 05/11/21 0000 05/11/21 0412  BP: (!) 173/59 (!) 173/59 (!) 165/56 (!) 156/55  Pulse: (!) 56 (!) 57 (!) 53 (!) 51  Resp: 20 20 18 14   Temp: 98.1 F (36.7 C) 98.1 F (36.7 C) 98.2 F (36.8 C) 98.2 F (36.8 C)  TempSrc: Oral Oral Oral   SpO2: 100% 99% 100% 99%  Weight:      Height:        Intake/Output Summary (Last 24 hours) at 05/11/2021 0746 Last data filed at 05/11/2021 0645 Gross per 24 hour  Intake 120 ml  Output 3600 ml  Net -3480 ml    Last 3 Weights 05/10/2021 05/08/2021 05/07/2021  Weight (lbs) 259 lb 0.7 oz 238 lb 8.6 oz 255 lb 15.3 oz  Weight (kg) 117.5 kg 108.2 kg 116.1 kg  Some encounter information is confidential and restricted. Go to Review Flowsheets activity to see all data.      Telemetry    Sinus bradycardia, heart rate 50s bpm - Personally Reviewed  ECG    No new tracings - Personally Reviewed  Physical Exam   GEN: No acute distress.  Appears frail, tired, soft-spoken Neck: JVD is difficult to assess secondary to body habitus Cardiac: RRR Respiratory: Diminished breath sounds at bases GI: Soft, nontender, non-distended  MS: 1+ edema; No deformity Neuro:  Nonfocal  Psych: Normal affect  Labs    High Sensitivity Troponin:   Recent Labs  Lab 04/18/21 0505 04/18/21 0658 04/18/21 1240 04/03/2021 0213 04/22/2021 0344  TROPONINIHS 2,129* 1,997* 1,842* 1,417* 1,514*      Chemistry Recent Labs  Lab 05/05/21 0555 05/06/21 0530 05/07/21 0551 05/08/21 0801 05/09/21 0509 05/10/21 0501 05/11/21 0422  NA 138   < > 137   < > 138 138 137  K 4.2   < > 4.6   < > 4.5 3.9 3.5  CL 100   < > 98   < > 100 98 96*  CO2 30   < > 32   < > 31 30 33*  GLUCOSE 132*   < > 216*   < > 132* 162* 215*  BUN 69*   < > 73*   < > 80* 79* 85*  CREATININE 3.55*   < > 3.44*   < > 3.70* 3.59* 3.64*  CALCIUM 8.0*   < > 8.3*   < > 8.3* 8.0* 8.0*  MG 2.1  --  2.2  --   --   --   --   ALBUMIN  --   --   --   --   --   --  1.9*  GFRNONAA 17*   < > 18*   < > 17*  17* 17*  ANIONGAP 8   < > 7   < > 7 10 8    < > = values in this interval not displayed.     Lipids No results for input(s): CHOL, TRIG, HDL, LABVLDL, LDLCALC, CHOLHDL in the last 168 hours.  Hematology Recent Labs  Lab 05/08/21 0801 05/08/21 0928 05/10/21 0501 05/11/21 0422  WBC 5.4  --  4.3 3.9*  RBC 3.21* 3.17* 3.09* 3.11*  HGB 9.0*  --  8.4* 8.4*  HCT 28.9*  --  27.2* 27.6*  MCV 90.0  --  88.0 88.7  MCH 28.0  --  27.2 27.0  MCHC 31.1  --  30.9 30.4  RDW 15.6*  --  15.8* 15.6*  PLT 254  --  259 246    Thyroid No results for input(s): TSH, FREET4 in the last 168 hours.  BNP No results for input(s): BNP, PROBNP in the last 168 hours.   DDimer No results for input(s): DDIMER in the last 168 hours.   Radiology    US PELVIS (TRANSABDOMINAL ONLY)  Result Date: 05/08/2021 IMPRESSION: A Foley catheter balloon is seen within a decompressed bladder. No other abnormalities. Electronically Signed   By: Dorise Bullion III M.D.   On: 05/08/2021 18:23   DG Chest Port 1 View  Result Date: 05/08/2021 IMPRESSION: 1. Patchy airspace disease in both perihilar lungs, pneumonia or pulmonary edema. 2. Questionable retrocardiac opacity versus superimposition of soft tissue structures. Regardless, suspected left pleural effusion. Electronically Signed   By: Keith Rake M.D.   On: 05/08/2021 17:14    Cardiac Studies   04/18/2021  1. Left ventricular ejection fraction, by estimation, is 60 to 65%. The  left ventricle has normal function. Left ventricular endocardial border  not optimally defined to evaluate regional wall motion. The left  ventricular internal cavity size was mildly  dilated. There is mild left ventricular hypertrophy. Left ventricular  diastolic parameters are consistent with Grade I diastolic dysfunction  (impaired relaxation). Elevated left atrial pressure.   2. Right ventricular systolic function is normal. The right ventricular  size  is not well visualized.   3.  The mitral valve is abnormal. Trivial mitral valve regurgitation. Mild  mitral stenosis. The mean mitral valve gradient is 5.0 mmHg.   4. Tricuspid valve regurgitation not well assessed.   5. The aortic valve has an indeterminant number of cusps. There is mild  calcification of the aortic valve. There is moderate thickening of the  aortic valve. Aortic valve regurgitation is trivial. Mild to moderate  aortic valve sclerosis/calcification is  present, without any evidence of aortic stenosis.   Patient Profile     75 y.o. male with history of CAD, HFpEF, COPD, pulmonary fibrosis, respiratory failure on home oxygen, CKD 4, diabetes being seen for shortness of breath and HFpEF.  Assessment & Plan    1. Acute on chronic diastolic heart failure with pulmonary hypertension: -Difficult diuresis likely due to underlying advanced chronic kidney disease -Continue of furosemide drip and using metolazone as needed, with the assistance of nephrology, appreciate their support -There has been mention of possible initiation of dialysis if his renal function does not improve, hopefully this can be avoided -He is being followed by palliative care, appreciate their support -Net negative 10.2 L for the admission with subjective improvement in his dyspnea and chest pain -Uncertain accuracy of weights  2.  NSTEMI: -Cardiac catheterization February 2022 showed moderate RCA and LAD disease and chronic occlusion of the mid left circumflex -Recommend continuing medical therapy with aspirin, atorvastatin, carvedilol, clopidogrel and Imdur -No plans for inpatient ischemic evaluation   3.  COPD/pulmonary fibrosis: -CPAP, oxygenation, inhalers as per pulmonary medicine -Long-term prognosis remains guarded     Signed, Christell Faith, PA-C  05/11/2021, 7:46 AM

## 2021-05-11 NOTE — Consult Note (Signed)
Urology Consult  I have been asked to see the patient by Dr. Renford Dills, for evaluation and management of gross hematuria and urinary retention.  Chief Complaint: Gross hematuria and urinary retention  History of Present Illness: Stephen [name] is a 75 y.o. year old with a medical history significant for interstitial lung disease, pulmonary hypertension, OSA with chronic respiratory failure on home O2 at 4 L and BiPAP, diastolic heart failure, CAD, DM, CKD 4 and with a recent hospitalization from 10 16-10 29 with an NSTEMI treated with heparin infusion who presented to the emergency room 2 days later for chest pain and admitted for NSTEMI/unstable angina who subsequently went into urinary retention four days ago requiring Foley catheter placement.    Wife is at bedside and contributes to history.  He and his wife state that he had no issues with urination prior to the admission, but the tamsulosin was discontinued during the previous admission from 10 16-10 29 and was not restarted upon discharge.  They have since restarted the tamsulosin during this hospitalization.  He was unable to urinate and starting to develop suprapubic discomfort and was In and Out cathed three times prior to Foley placement.  His wife states the urine was bloody each time.  The Foley was placed on 05/08/2021.  His wife states the urine was quite bloody at first, but the urine has been clearing over the past few days.  He had been seen in our office in 2018 for gross hematuria with Dr. Apolinar Junes.  He was evaluated with a noncontrast CT, renal ultrasound and cystoscopy revealing an enlarged friable prostate which was likely the source of his gross hematuria.  He was also evaluated for rUTI's and it was found that his risks for infections were likely multifactorial due to his obesity, diabetes, severe phimosis and BPH.    He did not follow-up due to his multiple comorbidities resulting in other urgent matters.  His  hemoglobin is 8.4 down from 10.18 days ago and his hematocrit is 27.6% down from 32% 8 days ago.  Serum creatinine is at 3.64 which is stable from 8 days ago.  UA from May 02, 2021 was unremarkable.    Currently on ceftriaxone to cover for PNA.   Past Medical History:  Diagnosis Date   Adrenal gland anomaly    Arthritis    CAD (coronary artery disease)    a. 04/2006 MI and PCI/stenting to mLCx & mRCA; b. 07/2009 Cath: patent LCX/RCA stents;  c. 12/2016 NSTEMI/Cath: LM 30, LAD 30p, 60m, 30d, D1/2/3 min irregs, LCX 152m ISR, OM1 min irregs, RCA 20p, 10 ISR, 13m, 50d, RPDA min irregs, RPAV 40-->Med Rx; d. 12/2017 MV: fixed lateral wall scar, mild anterior/anterior septal ischemia. EF 30-44%.   CHF (congestive heart failure) (HCC)    CKD (chronic kidney disease), stage III (HCC)    Colon polyps    DM2 (diabetes mellitus, type 2) (HCC)    insulin requiring   Dyslipidemia    GERD (gastroesophageal reflux disease)    History of echocardiogram    a. TTE 12/2016: EF 50-55%, mod concentric LVH, images inadequate for wall motion assessment, not technically sufficienct to allow for LV dias fxn, calcified mitral annulus    HTN (hypertension)    Kidney stones    Left Renal artery stenosis (HCC)    a. 07/2013 s/p PTA/stenting ([facility]); b. 07/2017 Renal Duplex: No significant RAS.   Myocardial infarction (HCC)    OSA (obstructive sleep apnea)  noted.  Physical Exam:  Vital signs in last 24 hours: Temp:  [97.7 F (36.5 C)-98.2 F (36.8 C)] 97.7 F (36.5 C) (11/09 1240) Pulse  Rate:  [51-57] 56 (11/09 1240) Resp:  [14-20] 17 (11/09 1240) BP: (146-173)/(54-59) 146/54 (11/09 1240) SpO2:  [93 %-100 %] 95 % (11/09 1240) Weight:  [110.3 kg] 110.3 kg (11/09 1021) Constitutional:  Alert and oriented, No acute distress HEENT: [place] AT, moist mucus membranes.  Trachea midline Cardiovascular: no clubbing, cyanosis, or edema. Respiratory: Normal respiratory effort GI: Abdomen is soft, nontender, non distended GU: No CVA tenderness.  16 Fr Foley in place draining a clear medium pink urine.  No clots seen.  Neurologic: Grossly intact, no focal deficits, moving all 4 extremities Psychiatric: Normal mood and affect   Laboratory Data:  Recent Labs    05/10/21 0501 05/11/21 0422  WBC 4.3 3.9*  HGB 8.4* 8.4*  HCT 27.2* 27.6*   Recent Labs    05/09/21 0509 05/10/21 0501 05/11/21 0422  NA 138 138 137  K 4.5 3.9 3.5  CL 100 98 96*  CO2 31 30 33*  GLUCOSE 132* 162* 215*  BUN 80* 79* 85*  CREATININE 3.70* 3.59* 3.64*  CALCIUM 8.3* 8.0* 8.0*   No results for input(s): LABPT, INR in the last 72 hours. No results for input(s): LABURIN in the last 72 hours. Results for orders placed or performed during the hospital encounter of 04/04/2021  Resp Panel by RT-PCR (Flu A&B, Covid)     Status: None   Collection Time: 04/26/2021 10:46 AM  Result Value Ref Range Status   SARS Coronavirus 2 by RT PCR NEGATIVE NEGATIVE Final    Comment: (NOTE) SARS-CoV-2 target nucleic acids are NOT DETECTED.  The SARS-CoV-2 RNA is generally detectable in upper respiratory specimens during the acute phase of infection. The lowest concentration of SARS-CoV-2 viral copies this assay can detect is 138 copies/mL. A negative result does not preclude SARS-Cov-2 infection and should not be used as the sole basis for treatment or other patient management decisions. A negative result may occur with  improper specimen collection/handling, submission of specimen other than nasopharyngeal swab, presence  of viral mutation(s) within the areas targeted by this assay, and inadequate number of viral copies(<138 copies/mL). A negative result must be combined with clinical observations, patient history, and epidemiological information. The expected result is Negative.  Fact Sheet for Patients:  EntrepreneurPulse.com.au  Fact Sheet for Healthcare Providers:  IncredibleEmployment.be  This test is no t yet approved or cleared by the [place] FDA and  has been authorized for detection and/or diagnosis of SARS-CoV-2 by FDA under an Emergency Use Authorization (EUA). This EUA will remain  in effect (meaning this test can be used) for the duration of the COVID-19 declaration under Section 564(b)(1) of the Act, 21 U.S.C.section 360bbb-3(b)(1), unless the authorization is terminated  or revoked sooner.       Influenza A by PCR NEGATIVE NEGATIVE Final   Influenza B by PCR NEGATIVE NEGATIVE Final    Comment: (NOTE) The Xpert Xpress SARS-CoV-2/FLU/RSV plus assay is intended as an aid in the diagnosis of influenza from Nasopharyngeal swab specimens and should not be used as a sole basis for treatment. Nasal washings and aspirates are unacceptable for Xpert Xpress SARS-CoV-2/FLU/RSV testing.  Fact Sheet for Patients: EntrepreneurPulse.com.au  Fact Sheet for Healthcare Providers: IncredibleEmployment.be  This test is not yet approved or cleared by the [place] FDA and has been authorized for detection and/or diagnosis of SARS-CoV-2 by  noted.  Physical Exam:  Vital signs in last 24 hours: Temp:  [97.7 F (36.5 C)-98.2 F (36.8 C)] 97.7 F (36.5 C) (11/09 1240) Pulse  Rate:  [51-57] 56 (11/09 1240) Resp:  [14-20] 17 (11/09 1240) BP: (146-173)/(54-59) 146/54 (11/09 1240) SpO2:  [93 %-100 %] 95 % (11/09 1240) Weight:  [110.3 kg] 110.3 kg (11/09 1021) Constitutional:  Alert and oriented, No acute distress HEENT: [place] AT, moist mucus membranes.  Trachea midline Cardiovascular: no clubbing, cyanosis, or edema. Respiratory: Normal respiratory effort GI: Abdomen is soft, nontender, non distended GU: No CVA tenderness.  16 Fr Foley in place draining a clear medium pink urine.  No clots seen.  Neurologic: Grossly intact, no focal deficits, moving all 4 extremities Psychiatric: Normal mood and affect   Laboratory Data:  Recent Labs    05/10/21 0501 05/11/21 0422  WBC 4.3 3.9*  HGB 8.4* 8.4*  HCT 27.2* 27.6*   Recent Labs    05/09/21 0509 05/10/21 0501 05/11/21 0422  NA 138 138 137  K 4.5 3.9 3.5  CL 100 98 96*  CO2 31 30 33*  GLUCOSE 132* 162* 215*  BUN 80* 79* 85*  CREATININE 3.70* 3.59* 3.64*  CALCIUM 8.3* 8.0* 8.0*   No results for input(s): LABPT, INR in the last 72 hours. No results for input(s): LABURIN in the last 72 hours. Results for orders placed or performed during the hospital encounter of 04/04/2021  Resp Panel by RT-PCR (Flu A&B, Covid)     Status: None   Collection Time: 04/26/2021 10:46 AM  Result Value Ref Range Status   SARS Coronavirus 2 by RT PCR NEGATIVE NEGATIVE Final    Comment: (NOTE) SARS-CoV-2 target nucleic acids are NOT DETECTED.  The SARS-CoV-2 RNA is generally detectable in upper respiratory specimens during the acute phase of infection. The lowest concentration of SARS-CoV-2 viral copies this assay can detect is 138 copies/mL. A negative result does not preclude SARS-Cov-2 infection and should not be used as the sole basis for treatment or other patient management decisions. A negative result may occur with  improper specimen collection/handling, submission of specimen other than nasopharyngeal swab, presence  of viral mutation(s) within the areas targeted by this assay, and inadequate number of viral copies(<138 copies/mL). A negative result must be combined with clinical observations, patient history, and epidemiological information. The expected result is Negative.  Fact Sheet for Patients:  EntrepreneurPulse.com.au  Fact Sheet for Healthcare Providers:  IncredibleEmployment.be  This test is no t yet approved or cleared by the [place] FDA and  has been authorized for detection and/or diagnosis of SARS-CoV-2 by FDA under an Emergency Use Authorization (EUA). This EUA will remain  in effect (meaning this test can be used) for the duration of the COVID-19 declaration under Section 564(b)(1) of the Act, 21 U.S.C.section 360bbb-3(b)(1), unless the authorization is terminated  or revoked sooner.       Influenza A by PCR NEGATIVE NEGATIVE Final   Influenza B by PCR NEGATIVE NEGATIVE Final    Comment: (NOTE) The Xpert Xpress SARS-CoV-2/FLU/RSV plus assay is intended as an aid in the diagnosis of influenza from Nasopharyngeal swab specimens and should not be used as a sole basis for treatment. Nasal washings and aspirates are unacceptable for Xpert Xpress SARS-CoV-2/FLU/RSV testing.  Fact Sheet for Patients: EntrepreneurPulse.com.au  Fact Sheet for Healthcare Providers: IncredibleEmployment.be  This test is not yet approved or cleared by the [place] FDA and has been authorized for detection and/or diagnosis of SARS-CoV-2 by  noted.  Physical Exam:  Vital signs in last 24 hours: Temp:  [97.7 F (36.5 C)-98.2 F (36.8 C)] 97.7 F (36.5 C) (11/09 1240) Pulse  Rate:  [51-57] 56 (11/09 1240) Resp:  [14-20] 17 (11/09 1240) BP: (146-173)/(54-59) 146/54 (11/09 1240) SpO2:  [93 %-100 %] 95 % (11/09 1240) Weight:  [110.3 kg] 110.3 kg (11/09 1021) Constitutional:  Alert and oriented, No acute distress HEENT: [place] AT, moist mucus membranes.  Trachea midline Cardiovascular: no clubbing, cyanosis, or edema. Respiratory: Normal respiratory effort GI: Abdomen is soft, nontender, non distended GU: No CVA tenderness.  16 Fr Foley in place draining a clear medium pink urine.  No clots seen.  Neurologic: Grossly intact, no focal deficits, moving all 4 extremities Psychiatric: Normal mood and affect   Laboratory Data:  Recent Labs    05/10/21 0501 05/11/21 0422  WBC 4.3 3.9*  HGB 8.4* 8.4*  HCT 27.2* 27.6*   Recent Labs    05/09/21 0509 05/10/21 0501 05/11/21 0422  NA 138 138 137  K 4.5 3.9 3.5  CL 100 98 96*  CO2 31 30 33*  GLUCOSE 132* 162* 215*  BUN 80* 79* 85*  CREATININE 3.70* 3.59* 3.64*  CALCIUM 8.3* 8.0* 8.0*   No results for input(s): LABPT, INR in the last 72 hours. No results for input(s): LABURIN in the last 72 hours. Results for orders placed or performed during the hospital encounter of 04/04/2021  Resp Panel by RT-PCR (Flu A&B, Covid)     Status: None   Collection Time: 04/26/2021 10:46 AM  Result Value Ref Range Status   SARS Coronavirus 2 by RT PCR NEGATIVE NEGATIVE Final    Comment: (NOTE) SARS-CoV-2 target nucleic acids are NOT DETECTED.  The SARS-CoV-2 RNA is generally detectable in upper respiratory specimens during the acute phase of infection. The lowest concentration of SARS-CoV-2 viral copies this assay can detect is 138 copies/mL. A negative result does not preclude SARS-Cov-2 infection and should not be used as the sole basis for treatment or other patient management decisions. A negative result may occur with  improper specimen collection/handling, submission of specimen other than nasopharyngeal swab, presence  of viral mutation(s) within the areas targeted by this assay, and inadequate number of viral copies(<138 copies/mL). A negative result must be combined with clinical observations, patient history, and epidemiological information. The expected result is Negative.  Fact Sheet for Patients:  EntrepreneurPulse.com.au  Fact Sheet for Healthcare Providers:  IncredibleEmployment.be  This test is no t yet approved or cleared by the [place] FDA and  has been authorized for detection and/or diagnosis of SARS-CoV-2 by FDA under an Emergency Use Authorization (EUA). This EUA will remain  in effect (meaning this test can be used) for the duration of the COVID-19 declaration under Section 564(b)(1) of the Act, 21 U.S.C.section 360bbb-3(b)(1), unless the authorization is terminated  or revoked sooner.       Influenza A by PCR NEGATIVE NEGATIVE Final   Influenza B by PCR NEGATIVE NEGATIVE Final    Comment: (NOTE) The Xpert Xpress SARS-CoV-2/FLU/RSV plus assay is intended as an aid in the diagnosis of influenza from Nasopharyngeal swab specimens and should not be used as a sole basis for treatment. Nasal washings and aspirates are unacceptable for Xpert Xpress SARS-CoV-2/FLU/RSV testing.  Fact Sheet for Patients: EntrepreneurPulse.com.au  Fact Sheet for Healthcare Providers: IncredibleEmployment.be  This test is not yet approved or cleared by the [place] FDA and has been authorized for detection and/or diagnosis of SARS-CoV-2 by  FDA under an Emergency Use Authorization (EUA). This EUA will remain in effect (meaning this test can be used) for the duration of the COVID-19 declaration under Section 564(b)(1) of the Act, 21 U.S.C. section 360bbb-3(b)(1), unless the authorization is terminated or revoked.  Performed at [facility], [address], [place], [place] [address]      Radiologic  Imaging: No results found.  Bladder Irrigation Patient's bladder was irrigated with 1000 mL of sterile water until clear light pink with the return of 10 cc of small clots.   Impression/Assessment:  75 year old male with multiple comorbidities including pulmonary hypertension, CKD IV, CHF and interstitial lung disease who presented to the hospital after suffering an NSTEMI and went into urinary retention with gross hematuria and subsequently had a Foley catheter placed.    Wife has stated that she has noted a clearing of the hematuria since the Foley catheter was placed.  Foley was found to be draining a clear medium pink urine and was irrigated until a clear light pink urine was obtained with a return of 10 cc of very small clot.   Recommendations: -continue to monitor for worsening hematuria and/or clots -continue to trend H & H  -irrigate Foley to clear clots prn -continue tamsulosin 0.4 mg daily -continue Foley for one week before a voiding trial as patient has just recently be placed back on his tamsulosin -Discussed with patient and his wife that the hematuria is most likely the result of trauma to his prostate with the catheterizations, but it would need further investigation on an outpatient basis to rule out other etiologies such as malignancy, stones and/or BPH -will need outpatient follow up for a hematuria work up   05/11/2021, 12:59 PM    Thank you for involving me in this patient's care.  Please page with any further questions or concerns.  Elizabth Palka

## 2021-05-12 DIAGNOSIS — I214 Non-ST elevation (NSTEMI) myocardial infarction: Secondary | ICD-10-CM | POA: Diagnosis not present

## 2021-05-12 DIAGNOSIS — I5033 Acute on chronic diastolic (congestive) heart failure: Secondary | ICD-10-CM | POA: Diagnosis not present

## 2021-05-12 DIAGNOSIS — Z7189 Other specified counseling: Secondary | ICD-10-CM

## 2021-05-12 DIAGNOSIS — R0789 Other chest pain: Secondary | ICD-10-CM | POA: Diagnosis not present

## 2021-05-12 DIAGNOSIS — J9621 Acute and chronic respiratory failure with hypoxia: Secondary | ICD-10-CM | POA: Diagnosis not present

## 2021-05-12 DIAGNOSIS — F321 Major depressive disorder, single episode, moderate: Secondary | ICD-10-CM | POA: Diagnosis not present

## 2021-05-12 DIAGNOSIS — I5032 Chronic diastolic (congestive) heart failure: Secondary | ICD-10-CM | POA: Diagnosis not present

## 2021-05-12 DIAGNOSIS — J9622 Acute and chronic respiratory failure with hypercapnia: Secondary | ICD-10-CM | POA: Diagnosis not present

## 2021-05-12 DIAGNOSIS — N184 Chronic kidney disease, stage 4 (severe): Secondary | ICD-10-CM | POA: Diagnosis not present

## 2021-05-12 LAB — CBC WITH DIFFERENTIAL/PLATELET
Abs Immature Granulocytes: 0.01 10*3/uL (ref 0.00–0.07)
Basophils Absolute: 0 10*3/uL (ref 0.0–0.1)
Basophils Relative: 1 %
Eosinophils Absolute: 0.3 10*3/uL (ref 0.0–0.5)
Eosinophils Relative: 6 %
HCT: 27.7 % — ABNORMAL LOW (ref 39.0–52.0)
Hemoglobin: 8.5 g/dL — ABNORMAL LOW (ref 13.0–17.0)
Immature Granulocytes: 0 %
Lymphocytes Relative: 17 %
Lymphs Abs: 0.7 10*3/uL (ref 0.7–4.0)
MCH: 27.5 pg (ref 26.0–34.0)
MCHC: 30.7 g/dL (ref 30.0–36.0)
MCV: 89.6 fL (ref 80.0–100.0)
Monocytes Absolute: 0.5 10*3/uL (ref 0.1–1.0)
Monocytes Relative: 12 %
Neutro Abs: 2.7 10*3/uL (ref 1.7–7.7)
Neutrophils Relative %: 64 %
Platelets: 266 10*3/uL (ref 150–400)
RBC: 3.09 MIL/uL — ABNORMAL LOW (ref 4.22–5.81)
RDW: 15.5 % (ref 11.5–15.5)
WBC: 4.3 10*3/uL (ref 4.0–10.5)
nRBC: 0 % (ref 0.0–0.2)

## 2021-05-12 LAB — RENAL FUNCTION PANEL
Albumin: 1.9 g/dL — ABNORMAL LOW (ref 3.5–5.0)
Anion gap: 8 (ref 5–15)
BUN: 86 mg/dL — ABNORMAL HIGH (ref 8–23)
CO2: 34 mmol/L — ABNORMAL HIGH (ref 22–32)
Calcium: 7.7 mg/dL — ABNORMAL LOW (ref 8.9–10.3)
Chloride: 94 mmol/L — ABNORMAL LOW (ref 98–111)
Creatinine, Ser: 3.25 mg/dL — ABNORMAL HIGH (ref 0.61–1.24)
GFR, Estimated: 19 mL/min — ABNORMAL LOW (ref >60)
Glucose, Bld: 162 mg/dL — ABNORMAL HIGH (ref 70–99)
Phosphorus: 4.8 mg/dL — ABNORMAL HIGH (ref 2.5–4.6)
Potassium: 3.2 mmol/L — ABNORMAL LOW (ref 3.5–5.1)
Sodium: 136 mmol/L (ref 135–145)

## 2021-05-12 LAB — GLUCOSE, CAPILLARY
Glucose-Capillary: 144 mg/dL — ABNORMAL HIGH (ref 70–99)
Glucose-Capillary: 239 mg/dL — ABNORMAL HIGH (ref 70–99)
Glucose-Capillary: 268 mg/dL — ABNORMAL HIGH (ref 70–99)
Glucose-Capillary: 267 mg/dL — ABNORMAL HIGH (ref 70–99)

## 2021-05-12 LAB — VITAMIN D 25 HYDROXY (VIT D DEFICIENCY, FRACTURES): Vit D, 25-Hydroxy: 29.07 ng/mL — ABNORMAL LOW (ref 30–100)

## 2021-05-12 LAB — MAGNESIUM: Magnesium: 2 mg/dL (ref 1.7–2.4)

## 2021-05-12 MED ORDER — METOLAZONE 5 MG PO TABS
5.0000 mg | ORAL_TABLET | Freq: Once | ORAL | Status: AC
  Administered 2021-05-13: 5 mg via ORAL
  Filled 2021-05-12: qty 1

## 2021-05-12 MED ORDER — POTASSIUM CHLORIDE CRYS ER 20 MEQ PO TBCR
40.0000 meq | EXTENDED_RELEASE_TABLET | Freq: Two times a day (BID) | ORAL | Status: AC
  Administered 2021-05-12 (×2): 40 meq via ORAL
  Filled 2021-05-12 (×2): qty 2

## 2021-05-12 MED ORDER — ESCITALOPRAM OXALATE 10 MG PO TABS
5.0000 mg | ORAL_TABLET | Freq: Every day | ORAL | Status: DC
  Administered 2021-05-13 – 2021-05-19 (×7): 5 mg via ORAL
  Filled 2021-05-12 (×7): qty 0.5

## 2021-05-12 NOTE — Progress Notes (Signed)
Physical Therapy Treatment Patient Details Name: Stephen [name] MRN: [id number] DOB: [birth year] Today's Date: 05/12/2021   History of Present Illness CLIFFARD HAIR is a 75 y.o. male with medical history significant for , interstitial lung disease, pulmonary hypertension, OSA with chronic respiratory failure on home O2 at 4L and Bipap, followed by pulmonology, as well as diastolic heart failure, CAD, DM, CKD IV, recently hospitalized from 10/16-10/29 with NSTEMI treated with heparin infusion, and respiratory failure secondary to CHF and COPD exacerbation who returns to the ED 2 days later with complaints of chest pain and shortness of breath. Chest pain was severe heaviness on his chest.EMS reports sats of 91% on his normal 4 L.  They attempted to increase it to 6 L with minimal improvement to 93% and and then put him on a nonrebreather and sats were 100%.  Wife contributes to history .    PT Comments    PT/OT co treat 2/2 to pt requires +2 assistance and is unable to tolerate 2 separate sessions. Pt is extremely deconditioned and has anxiety with any all mobility, transfers, and gait. Limited standing tolerance. Was able to ambulate ~ 6 ft with RW to [facility] prior to stand pivoting to recliner. Pt lasted ~ 1 hour in recliner prior to requesting to return to bed. Pt would need extensive PT going forward to maximize independence with ADLs. Pt [place] home with hospice seems most appropriate  this time.   Recommendations for follow up therapy are one component of a multi-disciplinary discharge planning process, led by the attending physician.  Recommendations may be updated based on patient status, additional functional criteria and insurance authorization.  Follow Up Recommendations  Other (comment) (home  with hospice/HHPT seems most appropriate)     Assistance Recommended at Discharge Frequent or constant Supervision/Assistance  Equipment Recommendations  None recommended by PT        Precautions / Restrictions Precautions Precautions: Fall Restrictions Weight Bearing Restrictions: No     Mobility  Bed Mobility Overal bed mobility: Needs Assistance Bed Mobility: Supine to Sit     Supine to sit: Mod assist;+2 for physical assistance;HOB elevated Sit to supine: Mod assist;Max assist;HOB elevated        Transfers Overall transfer level: Needs assistance Equipment used: Rolling walker (2 wheels) Transfers: Sit to/from Stand Sit to Stand: Min assist;+2 physical assistance Stand pivot transfers: Max assist;+2 physical assistance         General transfer comment: pt was able to stand from EOB 2 x and from [facility] 3 x. Poor standing tolerance and overall activity tolerance. Vitals stable but SOB noted. sao2 >92% throughout on 4 L    Ambulation/Gait Ambulation/Gait assistance: Min assist;Mod assist Gait Distance (Feet): 6 Feet Assistive device: Rolling walker (2 wheels) Gait Pattern/deviations: Step-through pattern Gait velocity: decreased     General Gait Details: Pt was able to ambulate 6 ft from EOB to [facility]. Had successful BM prior to standing and ambulating to recliner.     Balance Overall balance assessment: Needs assistance Sitting-balance support: Feet supported;Bilateral upper extremity supported Sitting balance-Leahy Scale: Fair     Standing balance support: Bilateral upper extremity supported;Reliant on assistive device for balance Standing balance-Leahy Scale: Fair         Cognition Arousal/Alertness: Awake/alert Behavior During Therapy: WFL for tasks assessed/performed Overall Cognitive Status: Impaired/Different from baseline Area of Impairment: Following commands;Safety/judgement;Problem solving        Following Commands: Follows one step commands with increased time Safety/Judgement: Decreased awareness of deficits;Decreased  awareness of safety   Problem Solving: Slow processing;Difficulty sequencing;Requires verbal cues;Requires  tactile cues General Comments: Pt is alert but is very flat.        Exercises Other Exercises Other Exercises: Pt educated re: d/c recs, falls prevention, ECS Other Exercises: Toileting, sup>sit, sit<>stand x3, sitting/standing balance/tolerance, ~5 ft mobility    General Comments General comments (skin integrity, edema, etc.): SpO2 90s t/o. HR 70s      Pertinent Vitals/Pain Pain Assessment: Faces Faces Pain Scale: Hurts little more Pain Location: L hand/wrist Pain Descriptors / Indicators: Grimacing;Aching Pain Intervention(s): Limited activity within patient's tolerance;Monitored during session;Premedicated before session;Repositioned     PT Goals (current goals can now be found in the care plan section) Acute Rehab PT Goals Patient Stated Goal: go home Progress towards PT goals: Progressing toward goals    Frequency    Min 2X/week      PT Plan Current plan remains appropriate    Co-evaluation   Reason for Co-Treatment: Complexity of the patient's impairments (multi-system involvement);For patient/therapist safety;To address functional/ADL transfers PT goals addressed during session: Mobility/safety with mobility;Proper use of DME;Balance;Strengthening/ROM OT goals addressed during session: ADL's and self-care      AM-PAC PT "6 Clicks" Mobility   Outcome Measure  Help needed turning from your back to your side while in a flat bed without using bedrails?: A Lot Help needed moving from lying on your back to sitting on the side of a flat bed without using bedrails?: A Lot Help needed moving to and from a bed to a chair (including a wheelchair)?: A Lot Help needed standing up from a chair using your arms (e.g., wheelchair or bedside chair)?: A Lot Help needed to walk in hospital room?: A Lot Help needed climbing 3-5 steps with a railing? : Total 6 Click Score: 11    End of Session Equipment Utilized During Treatment: Oxygen Activity Tolerance: Patient tolerated  treatment well Patient left: in bed;with call bell/phone within reach;with bed alarm set;with family/visitor present Nurse Communication: Mobility status PT Visit Diagnosis: Other abnormalities of gait and mobility (R26.89);Muscle weakness (generalized) (M62.81);Difficulty in walking, not elsewhere classified (R26.2)     Time: 5176-1607 PT Time Calculation (min) (ACUTE ONLY): 34 min  Charges:  $Therapeutic Activity: 8-22 mins                     Julaine Fusi PTA 05/12/21, 1:21 PM

## 2021-05-12 NOTE — Progress Notes (Signed)
[facility], [place] 05/12/21  Subjective:   LOS: 9 11/09 0701 - 11/10 0700 In: 480 [P.O.:480] Out: 2100 [Urine:2100] Patient known to our practice from outpatient follow-up.  He is followed by Dr. Holley Raring and was last seen on August 9. He has medical problems of longstanding diabetes, hypertension, history of nephrolithiasis, hyperlipidemia, coronary artery disease status post PTCA, pulmonary fibrosis on 4 L chronic oxygen, obstructive sleep apnea, history of right adrenalectomy, left renal artery stenosis status post stent, pulmonary hypertension  Patient seen sitting up in chair, wife at bedside.  Alert and oriented Improved appetite Denies current shortness of breath  Creatinine improved Urine output of 2 L recorded in previous 24 hours   Objective:  Vital signs in last 24 hours:  Temp:  [97.7 F (36.5 C)-98.3 F (36.8 C)] 97.7 F (36.5 C) (11/10 0803) Pulse Rate:  [53-57] 56 (11/10 0803) Resp:  [17-18] 17 (11/10 0803) BP: (125-158)/(51-60) 158/58 (11/10 0803) SpO2:  [94 %-100 %] 98 % (11/10 0803)  Weight change:  Filed Weights   05/08/21 0500 05/10/21 0310 05/11/21 1021  Weight: 108.2 kg 117.5 kg 110.3 kg    Intake/Output:    Intake/Output Summary (Last 24 hours) at 05/12/2021 1529 Last data filed at 05/12/2021 1330 Gross per 24 hour  Intake 840 ml  Output 2100 ml  Net -1260 ml      Physical Exam: General: NAD, seated in chair  HEENT Moist oral mucous membranes  Pulm/lungs Clear bilaterally, 4L O2 [place]  CVS/Heart Holosystolic murmur, no rub  Abdomen:  Soft, obese, nontender  Extremities: 1+ pitting edema bilaterally  Neurologic: Alert, oriented  Skin: Warm, dry  GU Foley catheter-hematuria       Basic Metabolic Panel:  Recent Labs  Lab 05/07/21 0551 05/08/21 0801 05/09/21 0509 05/10/21 0501 05/11/21 0422 05/12/21 0428 05/12/21 1017  NA 137 137 138 138 137 136  --   K 4.6 4.6 4.5 3.9 3.5 3.2*  --   CL 98 100 100  98 96* 94*  --   CO2 32 30 31 30  33* 34*  --   GLUCOSE 216* 156* 132* 162* 215* 162*  --   BUN 73* 72* 80* 79* 85* 86*  --   CREATININE 3.44* 3.73* 3.70* 3.59* 3.64* 3.25*  --   CALCIUM 8.3* 8.2* 8.3* 8.0* 8.0* 7.7*  --   MG 2.2  --   --   --   --   --  2.0  PHOS 4.3  --   --   --  5.5* 4.8*  --       CBC: Recent Labs  Lab 05/07/21 0551 05/08/21 0801 05/10/21 0501 05/11/21 0422 05/12/21 0429  WBC 8.9 5.4 4.3 3.9* 4.3  NEUTROABS  --   --  3.0 2.6 2.7  HGB 9.0* 9.0* 8.4* 8.4* 8.5*  HCT 29.8* 28.9* 27.2* 27.6* 27.7*  MCV 91.4 90.0 88.0 88.7 89.6  PLT 286 254 259 246 266       Lab Results  Component Value Date   HEPBSAG NON REACTIVE 05/09/2021      Microbiology:  No results found for this or any previous visit (from the past 240 hour(s)).   Coagulation Studies: No results for input(s): LABPROT, INR in the last 72 hours.   Urinalysis: No results for input(s): COLORURINE, LABSPEC, PHURINE, GLUCOSEU, HGBUR, BILIRUBINUR, KETONESUR, PROTEINUR, UROBILINOGEN, NITRITE, LEUKOCYTESUR in the last 72 hours.  Invalid input(s): APPERANCEUR     Imaging: No results found.   Medications:  sodium chloride 10 mL/hr at 05/11/21 2107   cefTRIAXone (ROCEPHIN)  IV 1 g (05/11/21 2108)     aspirin EC  81 mg Oral Daily   atorvastatin  80 mg Oral QHS   calcitRIOL  0.25 mcg Oral Daily   carvedilol  25 mg Oral BID WC   Chlorhexidine Gluconate Cloth  6 each Topical Daily   cloNIDine  0.1 mg Oral TID   clopidogrel  75 mg Oral Daily   dorzolamide-timolol  1 drop Both Eyes BID   epoetin (EPOGEN/PROCRIT) injection  3,000 Units Subcutaneous Q14 Days   ezetimibe  10 mg Oral Daily   feeding supplement  237 mL Oral BID BM   fluticasone furoate-vilanterol  1 puff Inhalation Daily   And   umeclidinium bromide  1 puff Inhalation Daily   furosemide  80 mg Oral BID   gabapentin  100 mg Oral BID   hydrALAZINE  25 mg Oral TID   insulin aspart  0-20 Units Subcutaneous TID WC    insulin aspart  0-5 Units Subcutaneous QHS   insulin detemir  30 Units Subcutaneous QHS   isosorbide mononitrate  120 mg Oral Daily   levothyroxine  50 mcg Oral Q0600   montelukast  10 mg Oral Daily   pantoprazole  40 mg Oral Daily   potassium chloride  40 mEq Oral BID   sodium chloride flush  3 mL Intravenous Q12H   tamsulosin  0.4 mg Oral QHS   cyanocobalamin  1,000 mcg Oral Daily   sodium chloride, acetaminophen, albuterol, ALPRAZolam, HYDROcodone-acetaminophen, nitroGLYCERIN, ondansetron (ZOFRAN) IV, polyethylene glycol  Assessment/ Plan:  75 y.o. male with medical problems of longstanding diabetes, hypertension, history of nephrolithiasis, hyperlipidemia, coronary artery disease status post PTCA, pulmonary fibrosis on 4 L chronic oxygen, obstructive sleep apnea, history of right adrenalectomy, left renal artery stenosis status post stent, pulmonary hypertension   Active Problems:   Acute on chronic diastolic CHF (congestive heart failure) (HCC)   Elevated troponin   CHF (congestive heart failure) (HCC)   Unstable angina (HCC)   Pulmonary hypertension (HCC)   Acute on chronic respiratory failure with hypoxia and hypercapnia (HCC)   CKD (chronic kidney disease), stage IV (HCC)   Interstitial lung disease ([place])   B12 deficiency   Pressure injury of skin   #. CKD st 4 Recent Labs    05/09/21 0509 05/10/21 0501 05/11/21 0422 05/12/21 0428  CREATININE 3.70* 3.59* 3.64* 3.25*   Patient has underlying CKD stage IV.  CKD risk factors include diabetes type 2, hypertension, history of IV contrast exposure, atherosclerosis and age Baseline appears to be 2.66/GFR 25 from April 17, 2021 Foley catheter placed for obstruction, hematuria improving Creatinine trending down.  Transition to oral diuretics yesterday, tolerating well.  Urine output 2.1 L recorded.  We will continue to monitor progress.  #. Anemia of CKD  Lab Results  Component Value Date   HGB 8.5 (L) 05/12/2021   Hemoglobin below target but stable  #. SHPTH  No results found for: PTH Lab Results  Component Value Date   PHOS 4.8 (H) 05/12/2021   Outpatient PTH 182 from February 08, 2021 Calcium low at 7.7 Continue calcitriol daily  #. HTN with CKD and lower extremity edema Blood pressure 158/58  Oral furosemide 80 mg twice daily Will prescribe for metolazone 5 mg tomorrow.  We will consider discharge on metolazone 2-3 times weekly.  #. Diabetes type 2 with CKD Hgb A1c MFr Bld (%)  Date Value  04/15/2021 7.5 (  H)  Management as per primary team  #Pulmonary hypertension Right heart catheterization December 06, 2020 Pulmonary artery pressure of 44 mm, right ventricular pressure of 48 mm 2D echo from April 18, 2021.  LVEF 60 to 24%, grade 1 diastolic dysfunction, elevated left atrial pressure, moderate thickening of aortic valve, mild calcification of mitral valve.  Cardiology following    LOS: [place]  11/10/20223:29 PM  [facility] [place], [place]

## 2021-05-12 NOTE — Progress Notes (Signed)
PROGRESS NOTE    Stephen [name]  MGN:[id number] DOB: Nov 28, 1945 DOA: 04/21/2021 PCP: [name], MD   Chief Complain: Shortness of breath  Brief Narrative: Patient is a 75 year old male with history of chronic hypoxic respiratory failure on BiPAP at night, pulmonary hypertension, interstitial lung disease chronically on 4 L of oxygen, chronic diastolic congestive heart failure, coronary disease, diabetes type 2, CKD stage IV who presented here with complaints of shortness of breath/chest pain.  He was recently hospitalized here from 10/16-2010/29 and was managed for NSTEMI , acute respiratory secondary to CHF and COPD exacerbation.  Patient currently being managed for volume overload.  Cardiology, nephrology following here.   S/p Lasix drip, switch to Lasix 80 p.o. twice daily on 11/9.  Assessment & Plan:   Active Problems:   Acute on chronic diastolic CHF (congestive heart failure) (HCC)   Elevated troponin   CHF (congestive heart failure) (HCC)   Unstable angina (HCC)   Pulmonary hypertension (HCC)   Acute on chronic respiratory failure with hypoxia and hypercapnia (HCC)   CKD (chronic kidney disease), stage IV (HCC)   Interstitial lung disease (HCC)   B12 deficiency   Pressure injury of skin   Acute on chronic diastolic congestive heart failure: Presented with volume overload, LE edema,elevated BNP. S/p Lasix drip.  Nephrology added metolazone 2-3 times a week 11/9 started Lasix 80 mg p.o. twice daily   Acute on chronic hypoxic/hypercarbic respiratory failure: History of pulm hypertension, ILD.  On BiPAP chronically with 4 to 6 L of oxygen during sleep.  Continue supplemental oxygen as needed.  Currently on baseline requirement.  Monitor respiratory status. Chest x-ray done on 05/09/2019 showed perihilar opacities, he was also started on ceftriaxone to cover for PNA,plan for 5 days course  NSTEMI: Presented with chest pain.  Recently admitted here and was managed for the  NSTEMI.  Elevated troponin presentation.  Initially started on heparin drip.  Currently on aspirin, Plavix, Imdur, Coreg, Lipitor.  Cardiology following.  Cardiology does not think left heart cath or possible PCI will  improve patient's symptoms.  AKI on CKD stage IV: Creatinine in the range of 3.  S/p Lasix drip.  Nephrology following here.  AKI is thought to be secondary to ATN. There was discussion about potentially starting him on dialysis, currently the plan is on hold G started Lasix 80 mg p.o. twice daily  Hypertension: Currently mildly hypertensive.  Monitor blood pressure   Anemia of chronic disease/iron deficiency: Iron studies shows iron of 17.  given a dose of IV iron.  Ferritin within normal range, folate and B12 within normal range started on Epogen  Diabetes type 2: Currently on Levemir, sliding scale insulin.  Monitor blood sugars.  OSA: Continue CPAP  Hematuria/urinary retention: Likely secondary to traumatic catheterization.  Has history of BPH.  Urology consulted and recommended outpatient follow-up.  We  recommended to continue Foley for now, also on tamsulosin Continued Lovenox due to hematuria Monitor CBC and transfuse PRBC if hemoglobin drops below 7  Morbid obesity: BMI of 39.6  Constipation: Continue bowel regimen  Generalized weakness/deconditioning: As per wife patient has acute decline and has been bedbound since last few weeks.  We requested for PT/OT evaluation,recommended HH  Depression, as per cardiology we may start antidepressant Consulted psychiatry for their expertise to start on antidepressant if needed TSH 1.3 wnl 2 weeks ago    DVT prophylaxis:Lovenox Code Status: DNR Family Communication: Wife at bedside Status is: Inpatient  Remains inpatient appropriate because:  Debility and multiple comorbidities, on IV antibiotics      Consultants: Nephrology, cardiology, psychiatry  Procedures: None  Antimicrobials:  Anti-infectives (From  admission, onward)    Start     Dose/Rate Route Frequency Ordered Stop   05/08/21 2000  cefTRIAXone (ROCEPHIN) 1 g in sodium chloride 0.9 % 100 mL IVPB        1 g 200 mL/hr over 30 Minutes Intravenous Every 24 hours 05/08/21 1817 05/13/21 1959   04/20/2021 1000  doxycycline (VIBRA-TABS) tablet 100 mg        100 mg Oral Every 12 hours 04/06/2021 0725 05/05/21 2123       Subjective:  No significant overnight events, patient's breathing improved, currently patient is on faltered oxygen which is his baseline.  Edema is improving.  Patient denied any active issues. Foley catheter draining bloody urine     Objective: Vitals:   05/11/21 2359 05/12/21 0055 05/12/21 0405 05/12/21 0803  BP:  (!) 131/51 125/60 (!) 158/58  Pulse: (!) 55 (!) 53 (!) 55 (!) 56  Resp:  18 18 17   Temp:  98.3 F (36.8 C) 97.7 F (36.5 C) 97.7 F (36.5 C)  TempSrc:      SpO2: 97% 99% 100% 98%  Weight:      Height:        Intake/Output Summary (Last 24 hours) at 05/12/2021 1537 Last data filed at 05/12/2021 1330 Gross per 24 hour  Intake 600 ml  Output 900 ml  Net -300 ml   Filed Weights   05/08/21 0500 05/10/21 0310 05/11/21 1021  Weight: 108.2 kg 117.5 kg 110.3 kg    Examination:  General exam: Chronically ill looking, lying in bed, obese, weak HEENT: PERRL Respiratory system: Diminished air sounds bilaterally, no wheezes or crackles  Cardiovascular system: S1 & S2 heard, RRR.  Gastrointestinal system: Abdomen is nondistended, soft and nontender. Central nervous system: Alert and oriented Extremities: Bilateral lower extremity edema, no clubbing ,no cyanosis Skin: No rashes, no ulcers,no icterus   GU: Foley draining bloody urine  Data Reviewed: I have personally reviewed following labs and imaging studies  CBC: Recent Labs  Lab 05/07/21 0551 05/08/21 0801 05/10/21 0501 05/11/21 0422 05/12/21 0429  WBC 8.9 5.4 4.3 3.9* 4.3  NEUTROABS  --   --  3.0 2.6 2.7  HGB 9.0* 9.0* 8.4* 8.4* 8.5*   HCT 29.8* 28.9* 27.2* 27.6* 27.7*  MCV 91.4 90.0 88.0 88.7 89.6  PLT 286 254 259 246 361   Basic Metabolic Panel: Recent Labs  Lab 05/07/21 0551 05/08/21 0801 05/09/21 0509 05/10/21 0501 05/11/21 0422 05/12/21 0428 05/12/21 1017  NA 137 137 138 138 137 136  --   K 4.6 4.6 4.5 3.9 3.5 3.2*  --   CL 98 100 100 98 96* 94*  --   CO2 32 30 31 30  33* 34*  --   GLUCOSE 216* 156* 132* 162* 215* 162*  --   BUN 73* 72* 80* 79* 85* 86*  --   CREATININE 3.44* 3.73* 3.70* 3.59* 3.64* 3.25*  --   CALCIUM 8.3* 8.2* 8.3* 8.0* 8.0* 7.7*  --   MG 2.2  --   --   --   --   --  2.0  PHOS 4.3  --   --   --  5.5* 4.8*  --    GFR: Estimated Creatinine Clearance: 23.2 mL/min (A) (by C-G formula based on SCr of 3.25 mg/dL (H)). Liver Function Tests: Recent Labs  Lab 05/11/21 0422  05/12/21 0428  ALBUMIN 1.9* 1.9*   No results for input(s): LIPASE, AMYLASE in the last 168 hours. No results for input(s): AMMONIA in the last 168 hours. Coagulation Profile: No results for input(s): INR, PROTIME in the last 168 hours. Cardiac Enzymes: No results for input(s): CKTOTAL, CKMB, CKMBINDEX, TROPONINI in the last 168 hours. BNP (last 3 results) No results for input(s): PROBNP in the last 8760 hours. HbA1C: No results for input(s): HGBA1C in the last 72 hours. CBG: Recent Labs  Lab 05/11/21 1219 05/11/21 1621 05/11/21 2020 05/12/21 0802 05/12/21 1134  GLUCAP 233* 264* 172* 144* 239*   Lipid Profile: No results for input(s): CHOL, HDL, LDLCALC, TRIG, CHOLHDL, LDLDIRECT in the last 72 hours. Thyroid Function Tests: No results for input(s): TSH, T4TOTAL, FREET4, T3FREE, THYROIDAB in the last 72 hours. Anemia Panel: No results for input(s): VITAMINB12, FOLATE, FERRITIN, TIBC, IRON, RETICCTPCT in the last 72 hours.  Sepsis Labs: No results for input(s): PROCALCITON, LATICACIDVEN in the last 168 hours.  No results found for this or any previous visit (from the past 240 hour(s)).         Radiology Studies: No results found.      Scheduled Meds:  aspirin EC  81 mg Oral Daily   atorvastatin  80 mg Oral QHS   calcitRIOL  0.25 mcg Oral Daily   carvedilol  25 mg Oral BID WC   Chlorhexidine Gluconate Cloth  6 each Topical Daily   cloNIDine  0.1 mg Oral TID   clopidogrel  75 mg Oral Daily   dorzolamide-timolol  1 drop Both Eyes BID   epoetin (EPOGEN/PROCRIT) injection  3,000 Units Subcutaneous Q14 Days   ezetimibe  10 mg Oral Daily   feeding supplement  237 mL Oral BID BM   fluticasone furoate-vilanterol  1 puff Inhalation Daily   And   umeclidinium bromide  1 puff Inhalation Daily   furosemide  80 mg Oral BID   gabapentin  100 mg Oral BID   hydrALAZINE  25 mg Oral TID   insulin aspart  0-20 Units Subcutaneous TID WC   insulin aspart  0-5 Units Subcutaneous QHS   insulin detemir  30 Units Subcutaneous QHS   isosorbide mononitrate  120 mg Oral Daily   levothyroxine  50 mcg Oral Q0600   [START ON 05/13/2021] metolazone  5 mg Oral Once   montelukast  10 mg Oral Daily   pantoprazole  40 mg Oral Daily   potassium chloride  40 mEq Oral BID   sodium chloride flush  3 mL Intravenous Q12H   tamsulosin  0.4 mg Oral QHS   cyanocobalamin  1,000 mcg Oral Daily   Continuous Infusions:  sodium chloride 10 mL/hr at 05/11/21 2107   cefTRIAXone (ROCEPHIN)  IV 1 g (05/11/21 2108)     LOS: 9 days    Time spent: 25 mins.More than 50% of that time was spent in counseling and/or coordination of care.      Val Riles, MD [facility] P11/04/2021, 3:37 PM

## 2021-05-12 NOTE — Progress Notes (Addendum)
Daily Progress Note   Patient Name: Stephen [name]       Date: 05/12/2021 DOB: 10/18/1945  Age: 75 y.o. MRN#: [id number] Attending Physician: Val Riles, MD Primary Care Physician: [name], MD Admit Date: 04/08/2021  Reason for Consultation/Follow-up: Establishing goals of care  Patient Profile/HPI:  75 y.o. male  with past medical history of multivessel coronary disease, chronic heart failure, pulmonary fibrosis, pulmonary hypertension, COPD, chronic kidney disease, diabetes, renal artery stenosis, and interstitial lung disease admitted on 04/02/2021 with acute on chronic heart failure with demand ischemia. Palliative medicine was consulted to discuss goals of care and symptom management. He was requiring frequent bipap and nephrology was consulted. He has not required HD at this point.    Subjective: Today Stephen [name] is sitting up in the chair. He is having a great amount of pain due to sacral pressure wounds and requesting to get back in bed.  He is slow to answer questions.  His spouse is at bedside.  We discussed his GOC- he notes his main GOC would be to get back to "mowing". He has not been able to mow in over 4 months.  His spouse brought up that he had been discussing SNF-rehab with is children. When asked about his feelings regarding this he says "whatever they want me to do".  I attempted to elicit his own personal feelings about the situation but he would not share. He only stated he wanted to get back into the bed.  I reviewed with him and his spouse options for care including SNF-rehab vs home with hospice support within the context of Stephen [name] stated [place].  I encouraged Stephen [name] to share his feelings and wishes with his family.   ROS   Physical Exam Vitals  and nursing note reviewed.  Pulmonary:     Effort: Pulmonary effort is normal.     Comments: Productive cough           Vital Signs: BP (!) 158/58 (BP Location: Right Arm)   Pulse (!) 56   Temp 97.7 F (36.5 C)   Resp 17   Ht 5\' 5"  (1.651 m)   Wt 110.3 kg   SpO2 98%   BMI 40.45 kg/m  SpO2: SpO2: 98 % O2 Device: O2 Device: Nasal Cannula O2 Flow Rate: O2 Flow Rate (  L/min): 4 L/min  Intake/output summary:  Intake/Output Summary (Last 24 hours) at 05/12/2021 1220 Last data filed at 05/12/2021 0950 Gross per 24 hour  Intake 720 ml  Output 2100 ml  Net -1380 ml   LBM: Last BM Date: 05/10/21 Baseline Weight: Weight: 114.3 kg Most recent weight: Weight: 110.3 kg       Palliative Assessment/Data: PPS: 30%      Patient Active Problem List   Diagnosis Date Noted  . Pressure injury of skin 05/05/2021  . Thrombophlebitis of superficial veins of left lower extremity   . Superficial thrombophlebitis of left upper extremity   . B12 deficiency   . Generalized weakness   . Acute respiratory failure with hypoxia and hypercapnia (HCC)   . Acute metabolic encephalopathy   . Acute on chronic respiratory failure with hypoxia and hypercapnia ([place]) 04/17/2021  . CKD (chronic kidney disease), stage IV ([place])   . Interstitial lung disease ([place])   . COPD with acute exacerbation ([place])   . Pounding heartbeat 03/09/2021  . Bruising 11/29/2020  . Cough 11/29/2020  . Unstable angina ([place]) 08/19/2020  . Pulmonary hypertension ([place]) 08/19/2020  . Tremor 07/29/2020  . Hypoxia 03/21/2020  . Left leg cellulitis 09/16/2019  . Asthma 03/31/2019  . Renal artery stenosis ([place]) 02/24/2019  . Advance care planning 12/26/2018  . Hypothyroidism 12/26/2018  . HLD (hyperlipidemia) 12/26/2018  . Coronary artery disease involving native coronary artery of native heart with angina pectoris ([place]) 10/09/2018  . Pulmonary hypertension, primary ([place]) 09/26/2018  . CHF (congestive heart failure) ([place])  09/20/2018  . Knee pain 03/26/2018  . Encounter for chronic pain management 12/23/2017  . Healthcare maintenance 09/21/2017  . Skin lesion 06/15/2017  . Acute kidney injury superimposed on CKD ([place]) 01/15/2017  . Elevated troponin 01/15/2017  . Coronary stent occlusion: Occluded circumflex stent 01/13/2017  . Accelerated hypertension with diastolic congestive heart failure, NYHA class 3 ([place]); elevated LVEDP on chronic catheterization. 01/13/2017  . NSTEMI (non-ST elevated myocardial infarction) ([place]) 01/11/2017  . Blood in urine 12/08/2016  . Lymphedema 10/23/2016  . Low back pain with right-sided sciatica 04/03/2014  . Muscle spasm 04/24/2013  . Chronic edema 11/13/2012  . Leg edema, right 09/29/2012  . SK (seborrheic keratosis) 09/29/2012  . Acute on chronic diastolic CHF (congestive heart failure) ([place]) 12/15/2011  . Sebaceous cyst 11/28/2011  . Obesity, Class III, BMI 40-49.9 (morbid obesity) ([place]) 07/27/2011  . Hyperlipidemia with target low density lipoprotein (LDL) cholesterol less than 70 mg/dL 04/26/2011  . Adrenal adenoma 03/02/2011  . Back pain 12/06/2010  . Kidney stones 12/06/2010  . Essential hypertension, benign 10/28/2010  . PULMONARY FIBROSIS 09/15/2010  . CAD (coronary artery disease) 09/01/2010  . Type 2 diabetes mellitus with renal complication ([place]) 16/04/9603  . METABOLIC SYNDROME X 54/03/8118  . OSA (obstructive sleep apnea) 08/31/2010    Palliative Care Assessment & Plan    Assessment/Recommendations/Plan   Continue current scope PMT will continue to visit and discuss [name] Recommend outpatient Palliative at SNF vs home with Hospice- for now patient (his family) is requesting SNF   Code Status: DNR  Prognosis:  < 6 months  Discharge Planning: To Be Determined  Care plan was discussed with patient and care team  Thank you for allowing the Palliative Medicine Team to assist in the care of this patient.  Total time: 40 mins Prolonged  billing: No     Greater than 50%  of this time was spent counseling and coordinating care related to the  above assessment and plan.  Mariana Kaufman, AGNP-C Palliative Medicine   Please contact Palliative Medicine Team phone at [phone] for questions and concerns.

## 2021-05-12 NOTE — Progress Notes (Addendum)
Occupational Therapy Treatment Patient Details Name: Stephen [name] MRN: [id number] DOB: [birth year] Today's Date: 05/12/2021   History of present illness Stephen [name] is a 75 y.o. male with medical history significant for , interstitial lung disease, pulmonary hypertension, OSA with chronic respiratory failure on home O2 at 4L and Bipap, followed by pulmonology, as well as diastolic heart failure, CAD, DM, CKD IV, recently hospitalized from 10/16-10/29 with NSTEMI treated with heparin infusion, and respiratory failure secondary to CHF and COPD exacerbation who returns to the ED 2 days later with complaints of chest pain and shortness of breath. Chest pain was severe heaviness on his chest.EMS reports sats of 91% on his normal 4 L.  They attempted to increase it to 6 L with minimal improvement to 93% and and then put him on a nonrebreather and sats were 100%.  Wife contributes to history .   OT comments  Stephen [name] was seen for OT/PT co-treatment on this date. Upon arrival to room pt reclined in bed, PT at bedside, pt agreeable to tx. MOD A x2 exit R side of bed, upon initial standing pt noted to have had BM and assisted to BSC> MIN A x2 + RW for bed>BSC ~ 5 ft. MAX A x2 perihygiene in standing. Pt required multiple standing trials and significantly fatigued from perihygiene, vital signs WNL t/o. MAX A x2 + RW BSC>chair SPT. Pt making good progress toward goals. Pt continues to benefit from skilled OT services to maximize return to PLOF and minimize risk of future falls, injury, caregiver burden, and readmission. Will continue to follow POC. Discharge recommendation remains appropriate.     Recommendations for follow up therapy are one component of a multi-disciplinary discharge planning process, led by the attending physician.  Recommendations may be updated based on patient status, additional functional criteria and insurance authorization.    Follow Up Recommendations  Home health OT     Assistance Recommended at Discharge Frequent or constant Supervision/Assistance  Equipment Recommendations  BSC/3in1;Tub/shower seat    Recommendations for Other Services      Precautions / Restrictions Precautions Precautions: Fall Restrictions Weight Bearing Restrictions: No       Mobility Bed Mobility Overal bed mobility: Needs Assistance Bed Mobility: Supine to Sit     Supine to sit: Mod assist;+2 for physical assistance;HOB elevated          Transfers Overall transfer level: Needs assistance Equipment used: Rolling walker (2 wheels) Transfers: Sit to/from Stand;Bed to chair/wheelchair/BSC Sit to Stand: Min assist;+2 physical assistance Stand pivot transfers: Max assist;+2 physical assistance               Balance Overall balance assessment: Needs assistance Sitting-balance support: Single extremity supported;Feet supported Sitting balance-Leahy Scale: Fair     Standing balance support: Bilateral upper extremity supported;Reliant on assistive device for balance Standing balance-Leahy Scale: Poor                             ADL either performed or assessed with clinical judgement   ADL Overall ADL's : Needs assistance/impaired                                       General ADL Comments: MIN A x2 + RW for bed>BSC ~ 5 ft. MAX A x2 perihygiene in standing. Pt required multiple standing trials and significantly fatigued from  perihygiene, vital signs WNL t/o. MAX A x2 + RW BSC>chair SPT.      Cognition Arousal/Alertness: Awake/alert Behavior During Therapy: WFL for tasks assessed/performed Overall Cognitive Status: Impaired/Different from baseline Area of Impairment: Following commands;Safety/judgement;Problem solving                       Following Commands: Follows one step commands with increased time Safety/Judgement: Decreased awareness of deficits;Decreased awareness of safety   Problem Solving: Slow  processing;Difficulty sequencing;Requires verbal cues;Requires tactile cues            Exercises Exercises: Other exercises Other Exercises Other Exercises: Pt educated re: d/c recs, falls prevention, ECS Other Exercises: Toileting, sup>sit, sit<>stand x3, sitting/standing balance/tolerance, ~5 ft mobility      General Comments SpO2 90s t/o. HR 70s    Pertinent Vitals/ Pain       Pain Assessment: Faces Faces Pain Scale: Hurts little more Pain Location: L hand/wrist Pain Descriptors / Indicators: Grimacing;Aching Pain Intervention(s): Limited activity within patient's tolerance;Repositioned   Frequency  Min 2X/week        Progress Toward Goals  OT Goals(current goals can now be found in the care plan section)  Progress towards OT goals: Progressing toward goals  Acute Rehab OT Goals Patient Stated Goal: to use bathroom OT Goal Formulation: With patient/family Time For Goal Achievement: 05/18/21 Potential to Achieve Goals: Fair ADL Goals Pt Will Perform Lower Body Dressing: with min assist;with mod assist;with adaptive equipment;sit to/from stand Pt Will Transfer to Toilet: with min assist;stand pivot transfer;bedside commode Pt Will Perform Toileting - Clothing Manipulation and hygiene: with min assist;sitting/lateral leans Pt/caregiver will Perform Home Exercise Program: Increased strength;Both right and left upper extremity;With minimal assist Additional ADL Goal #1: Pt will verbalize plan to implement at least 2 learned ECS into daily routines to support safety/indep with ADL/IADL.  Plan Discharge plan remains appropriate;Frequency remains appropriate    Co-evaluation    PT/OT/SLP Co-Evaluation/Treatment: Yes Reason for Co-Treatment: For patient/therapist safety;To address functional/ADL transfers PT goals addressed during session: Mobility/safety with mobility OT goals addressed during session: ADL's and self-care      AM-PAC OT "6 Clicks" Daily Activity      Outcome Measure   Help from another person eating meals?: A Little Help from another person taking care of personal grooming?: A Lot Help from another person toileting, which includes using toliet, bedpan, or urinal?: A Lot Help from another person bathing (including washing, rinsing, drying)?: A Lot Help from another person to put on and taking off regular upper body clothing?: A Lot Help from another person to put on and taking off regular lower body clothing?: A Lot 6 Click Score: 13    End of Session Equipment Utilized During Treatment: Oxygen;Gait belt;Rolling walker (2 wheels)  OT Visit Diagnosis: Other abnormalities of gait and mobility (R26.89);Muscle weakness (generalized) (M62.81);Pain Pain - Right/Left: Left Pain - part of body: Arm   Activity Tolerance Patient limited by fatigue;Patient tolerated treatment well   Patient Left in chair;with call bell/phone within reach;with chair alarm set;with family/visitor present   Nurse Communication Mobility status        Time: 9702-6378 OT Time Calculation (min): 24 min  Charges: OT General Charges $OT Visit: 1 Visit OT Treatments $Self Care/Home Management : 8-22 mins   Dessie Coma, M.S. OTR/L  05/12/21, 11:02 AM  ascom [phone]

## 2021-05-12 NOTE — Progress Notes (Signed)
Progress Note  Patient Name: Stephen [name] Date of Encounter: 05/12/2021  [facility] Cardiologist: Ida Rogue, MD   Subjective   Wife at the bedside this morning, Long discussion concerning past several weeks in the hospital Not making much progress in terms of his recovery, He has been declining getting out of bed, moving, limiting his activities -Wife aware of his extreme deconditioning which seems to be getting worse Though reports that her daughter works at [employer] nursing/rehab facility, wondering if he can go there for rehabilitation  Inpatient Medications    Scheduled Meds:  aspirin EC  81 mg Oral Daily   atorvastatin  80 mg Oral QHS   calcitRIOL  0.25 mcg Oral Daily   carvedilol  25 mg Oral BID WC   Chlorhexidine Gluconate Cloth  6 each Topical Daily   cloNIDine  0.1 mg Oral TID   clopidogrel  75 mg Oral Daily   dorzolamide-timolol  1 drop Both Eyes BID   epoetin (EPOGEN/PROCRIT) injection  3,000 Units Subcutaneous Q14 Days   ezetimibe  10 mg Oral Daily   feeding supplement  237 mL Oral BID BM   fluticasone furoate-vilanterol  1 puff Inhalation Daily   And   umeclidinium bromide  1 puff Inhalation Daily   furosemide  80 mg Oral BID   gabapentin  100 mg Oral BID   hydrALAZINE  25 mg Oral TID   insulin aspart  0-20 Units Subcutaneous TID WC   insulin aspart  0-5 Units Subcutaneous QHS   insulin detemir  30 Units Subcutaneous QHS   isosorbide mononitrate  120 mg Oral Daily   levothyroxine  50 mcg Oral Q0600   montelukast  10 mg Oral Daily   pantoprazole  40 mg Oral Daily   potassium chloride  40 mEq Oral BID   sodium chloride flush  3 mL Intravenous Q12H   tamsulosin  0.4 mg Oral QHS   cyanocobalamin  1,000 mcg Oral Daily   Continuous Infusions:  sodium chloride 10 mL/hr at 05/11/21 2107   cefTRIAXone (ROCEPHIN)  IV 1 g (05/11/21 2108)   PRN Meds: sodium chloride, acetaminophen, albuterol, ALPRAZolam, HYDROcodone-acetaminophen, nitroGLYCERIN,  ondansetron (ZOFRAN) IV, polyethylene glycol   Vital Signs    Vitals:   05/11/21 2359 05/12/21 0055 05/12/21 0405 05/12/21 0803  BP:  (!) 131/51 125/60 (!) 158/58  Pulse: (!) 55 (!) 53 (!) 55 (!) 56  Resp:  18 18 17   Temp:  98.3 F (36.8 C) 97.7 F (36.5 C) 97.7 F (36.5 C)  TempSrc:      SpO2: 97% 99% 100% 98%  Weight:      Height:        Intake/Output Summary (Last 24 hours) at 05/12/2021 1441 Last data filed at 05/12/2021 1330 Gross per 24 hour  Intake 840 ml  Output 2100 ml  Net -1260 ml   Last 3 Weights 05/11/2021 05/10/2021 05/08/2021  Weight (lbs) 243 lb 1.6 oz 259 lb 0.7 oz 238 lb 8.6 oz  Weight (kg) 110.269 kg 117.5 kg 108.2 kg  Some encounter information is confidential and restricted. Go to Review Flowsheets activity to see all data.      Telemetry    - Personally Reviewed  ECG    Normal sinus rhythm- Personally Reviewed  Physical Exam   GEN: No acute distress.  Laying supine in bed, alert Neck: Unable to estimate JVD Cardiac: Regular rate and rhythm no murmurs, rubs, or gallops.  Respiratory: Decreased breath sounds, scattered Rales GI: Soft,  nontender, non-distended  MS: No edema; No deformity. Neuro:  Nonfocal, full exam not performed Psych: Depressed  Labs    High Sensitivity Troponin:   Recent Labs  Lab 04/18/21 0505 04/18/21 0658 04/18/21 1240 04/18/2021 0213 04/26/2021 0344  TROPONINIHS 2,129* 1,997* 1,842* 1,417* 1,514*     Chemistry Recent Labs  Lab 05/07/21 0551 05/08/21 0801 05/10/21 0501 05/11/21 0422 05/12/21 0428 05/12/21 1017  NA 137   < > 138 137 136  --   K 4.6   < > 3.9 3.5 3.2*  --   CL 98   < > 98 96* 94*  --   CO2 32   < > 30 33* 34*  --   GLUCOSE 216*   < > 162* 215* 162*  --   BUN 73*   < > 79* 85* 86*  --   CREATININE 3.44*   < > 3.59* 3.64* 3.25*  --   CALCIUM 8.3*   < > 8.0* 8.0* 7.7*  --   MG 2.2  --   --   --   --  2.0  ALBUMIN  --   --   --  1.9* 1.9*  --   GFRNONAA 18*   < > 17* 17* 19*  --   ANIONGAP  7   < > 10 8 8   --    < > = values in this interval not displayed.    Lipids No results for input(s): CHOL, TRIG, HDL, LABVLDL, LDLCALC, CHOLHDL in the last 168 hours.  Hematology Recent Labs  Lab 05/10/21 0501 05/11/21 0422 05/12/21 0429  WBC 4.3 3.9* 4.3  RBC 3.09* 3.11* 3.09*  HGB 8.4* 8.4* 8.5*  HCT 27.2* 27.6* 27.7*  MCV 88.0 88.7 89.6  MCH 27.2 27.0 27.5  MCHC 30.9 30.4 30.7  RDW 15.8* 15.6* 15.5  PLT 259 246 266   Thyroid No results for input(s): TSH, FREET4 in the last 168 hours.  BNPNo results for input(s): BNP, PROBNP in the last 168 hours.  DDimer No results for input(s): DDIMER in the last 168 hours.   Radiology    No results found.  Cardiac Studies    Patient Profile     75 y.o. male with a hx of  pulmonary hypertension, chronic diastolic CHF, CAD, pulmonary fibrosis s/p wedge resection, obesity, COPD on home oxygen, OSA on CPAP, DM2, CKD, renal artery stenosis, and who is being seen today for the evaluation of chest pain and elevated HS Tn.   Assessment & Plan    1.  Acute on chronic HFpEF/pulmonary arterial hypertension:  Treated aggressively this admission with IV Lasix, on and off depending on his encephalopathy -Continue torsemide 40 twice daily, oral Lasix 80 twice daily (per nephrology)   2.  CAD/non-STEMI:  Known severe disease Admitted with chest pain and dyspnea.  Has a history of chronic chest pain  Peak high-sensitivity troponin of 2129.   Cath in February with moderate LAD and RCA disease and occluded left circumflex with right to left and left to left collaterals.  -Medical management, not a candidate for ischemic work-up -Additional episodes of chest pain should be treated with sublingual nitroglycerin   3.  Essential hypertension:  Long history for poorly controlled hypertension likely contributing to underlying renal dysfunction Pressures 130-1 50 Continue current doses of clonidine,  isosorbide, Coreg, hydralazine  4.   Hyperlipidemia:  LDL of 69 in June 2022.  On statin.  5.  Stage IV chronic kidney disease:  Long history of  poorly controlled hypertension Not a good candidate for dialysis but will defer to nephrology   6.  Interstitial lung disease/pulmonary fibrosis:  Requires BiPAP when napping and in the evening, important he has this at any nursing facility or at home Develops encephalopathy when he does not wear his BiPAP  7.  Obstructive sleep apnea:  Uses CPAP/BiPAP at night.  Dispo Wife and patient would like to do rehab, Recommend we put in request for skilled nursing where daughter works   Total encounter time more than 35 minutes  Greater than 50% was spent in counseling and coordination of care with the patient   For questions or updates, please contact [place] Please consult [email or web address] for contact info under        Signed, Ida Rogue, MD  05/12/2021, 2:41 PM

## 2021-05-12 NOTE — ED Provider Notes (Signed)
[facility] Emergency Department Provider Note   ____________________________________________   Event Date/Time   First MD Initiated Contact with Patient 04/05/21 2105     (approximate)  I have reviewed the triage vital signs and the nursing notes.   HISTORY  Chief Complaint Shortness of Breath    HPI Stephen [name] is a 75 y.o. male who presents complaining of increasing shortness of breath and found to be hypoxic to the 70s on 6 L nasal cannula at home which is patient's baseline nasal cannula requirement.  Patient states that he has a home pulse ox and found his oxygen to be in the mid 70s when he was having a bout of coughing.  Patient also states that he has been having some nasal congestion that is draining down into his chest causing chest congestion as well with a productive cough of white sputum.  Patient states that he has been having trouble with pulmonary edema and requiring increasing doses of Lasix over the last 2 weeks in association with his worsening shortness of breath.  Patient also endorses some mild chest tightness associated with the shortness of breath.          Past Medical History:  Diagnosis Date   Adrenal gland anomaly    Arthritis    CAD (coronary artery disease)    a. 04/2006 MI and PCI/stenting to mLCx & mRCA; b. 07/2009 Cath: patent LCX/RCA stents;  c. 12/2016 NSTEMI/Cath: LM 30, LAD 30p, 37m 30d, D1/2/3 min irregs, LCX 1065mSR, OM1 min irregs, RCA 20p, 10 ISR, 7024m0d, RPDA min irregs, RPAV 40-->Med Rx; d. 12/2017 MV: fixed lateral wall scar, mild anterior/anterior septal ischemia. EF 30-44%.   CHF (congestive heart failure) (HCC)    CKD (chronic kidney disease), stage III (HCC)    Colon polyps    DM2 (diabetes mellitus, type 2) (HCC)    insulin requiring   Dyslipidemia    GERD (gastroesophageal reflux disease)    History of echocardiogram    a. TTE 12/2016: EF 50-55%, mod concentric LVH, images inadequate for wall  motion assessment, not technically sufficienct to allow for LV dias fxn, calcified mitral annulus    HTN (hypertension)    Kidney stones    Left Renal artery stenosis ([place]  a. 07/2013 s/p PTA/stenting ([place]); b. 07/2017 Renal Duplex: No significant RAS.   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   Recurrent UTI    Rotator cuff disorder    right   Skin cancer    head   Thyroid disorder    Venous insufficiency of both lower extremities    a. s/p laser treatment.    Patient Active Problem List   Diagnosis Date Noted   Pressure injury of skin 05/05/2021   Thrombophlebitis of superficial veins of left lower extremity    Superficial thrombophlebitis of left upper extremity    B12 deficiency    Generalized weakness    Acute respiratory failure with hypoxia and hypercapnia (HCC)    Acute metabolic encephalopathy    Acute on chronic respiratory failure with hypoxia and hypercapnia (HCC) 04/17/2021   CKD (chronic kidney disease), stage IV (HCC)    Interstitial lung disease ([place]  COPD with acute exacerbation ([place]  Pounding heartbeat 03/09/2021   Bruising 11/29/2020   Cough 11/29/2020  Unstable angina ([place]) 08/19/2020   Pulmonary hypertension ([place]) 08/19/2020   Tremor 07/29/2020   Hypoxia 03/21/2020   Left leg cellulitis 09/16/2019   Asthma 03/31/2019   Renal artery stenosis ([place]) 02/24/2019   Advance care planning 12/26/2018   Hypothyroidism 12/26/2018   HLD (hyperlipidemia) 12/26/2018   Coronary artery disease involving native coronary artery of native heart with angina pectoris ([place]) 10/09/2018   Pulmonary hypertension, primary ([place]) 09/26/2018   CHF (congestive heart failure) ([place]) 09/20/2018   Knee pain 03/26/2018   Encounter for chronic pain management 12/23/2017   Healthcare maintenance 09/21/2017   Skin lesion 06/15/2017    Acute kidney injury superimposed on CKD ([place]) 01/15/2017   Elevated troponin 01/15/2017   Coronary stent occlusion: Occluded circumflex stent 01/13/2017   Accelerated hypertension with diastolic congestive heart failure, NYHA class 3 ([place]); elevated LVEDP on chronic catheterization. 01/13/2017   NSTEMI (non-ST elevated myocardial infarction) ([place]) 01/11/2017   Blood in urine 12/08/2016   Lymphedema 10/23/2016   Low back pain with right-sided sciatica 04/03/2014   Muscle spasm 04/24/2013   Chronic edema 11/13/2012   Leg edema, right 09/29/2012   SK (seborrheic keratosis) 09/29/2012   Acute on chronic diastolic CHF (congestive heart failure) ([place]) 12/15/2011   Sebaceous cyst 11/28/2011   Obesity, Class III, BMI 40-49.9 (morbid obesity) ([place]) 07/27/2011   Hyperlipidemia with target low density lipoprotein (LDL) cholesterol less than 70 mg/dL 04/26/2011   Adrenal adenoma 03/02/2011   Back pain 12/06/2010   Kidney stones 12/06/2010   Essential hypertension, benign 10/28/2010   PULMONARY FIBROSIS 09/15/2010   CAD (coronary artery disease) 09/01/2010   Type 2 diabetes mellitus with renal complication ([place]) 48/54/6270   METABOLIC SYNDROME X 35/00/9381   OSA (obstructive sleep apnea) 08/31/2010    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   CARPAL TUNNEL RELEASE     bilateral   CATARACT EXTRACTION W/PHACO Right 07/08/2018   Procedure: CATARACT EXTRACTION PHACO AND INTRAOCULAR LENS PLACEMENT ([place]) RIGHT DIABETIC;  Surgeon: Eulogio Bear, MD;  Location: [place];  Service: Ophthalmology;  Laterality: Right;  Diabetic - insulin sleep apnea   CATARACT EXTRACTION W/PHACO Left 08/05/2018   Procedure: CATARACT EXTRACTION PHACO AND INTRAOCULAR LENS PLACEMENT ([place]) LEFT DIABETIC;  Surgeon: Eulogio Bear, MD;  Location: [place];  Service: Ophthalmology;  Laterality: Left;  DIABETIC   CORONARY ANGIOGRAPHY N/A 08/19/2020    Procedure: CORONARY ANGIOGRAPHY;  Surgeon: Minna Merritts, MD;  Location: [facility];  Service: Cardiovascular;  Laterality: N/A;   CORONARY ANGIOPLASTY WITH STENT PLACEMENT  2007   x 2   KNEE SURGERY     right   LEFT HEART CATH AND CORONARY ANGIOGRAPHY N/A 01/11/2017   Procedure: Left Heart Cath and Coronary Angiography;  Surgeon: Wellington Hampshire, MD;  Location: [facility];  Service: Cardiovascular;  Laterality: N/A;   LUNG BIOPSY     RENAL ARTERY STENT  2015   L   RIGHT HEART CATH Right 12/06/2020   Procedure: RIGHT HEART CATH;  Surgeon: Wellington Hampshire, MD;  Location: [facility];  Service: Cardiovascular;  Laterality: Right;   SKIN CANCER EXCISION     back of head    Prior to Admission medications   Medication Sig Start Date End Date Taking? Authorizing Provider  ACCU-CHEK AVIVA PLUS test strip USE AS INSTRUCTED TO CHECK BLOOD SUGAR 3 TIMES DAILY OR AS NEEDED 03/16/21   [name],  Elveria Rising, MD  ACCU-CHEK SOFTCLIX LANCETS lancets Use as instructed to check blood sugar three times daily and as needed.  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 05/16/17   [name], MD  acetaminophen (TYLENOL) 500 MG tablet Take 1,000 mg by mouth every 6 (six) hours as needed.    [provider]  albuterol (VENTOLIN HFA) 108 (90 Base) MCG/ACT inhaler INHALE 2 PUFFS INTO THE LUNGS EVERY 6 HOURS AS NEEDED FOR WHEEZING OR SHORTNESS OF BREATH. DISPENSE 3 INHALERS 11/01/20   [name], MD  Alcohol Swabs (B-D SINGLE USE SWABS REGULAR) PADS USE TO CLEANS AREA PRIOR TO CHECKING BLOOD SUGAR THREE TIMES DAILY AND AS NEEDED 09/29/20   [name], MD  aspirin EC 81 MG tablet Take 1 tablet (81 mg total) by mouth daily. Swallow whole. 11/25/20   [name], MD  atorvastatin (LIPITOR) 80 MG tablet TAKE 1 TABLET AT BEDTIME 01/12/21   [name], MD  Blood Glucose Monitoring Suppl (ACCU-CHEK AVIVA PLUS) w/Device KIT Use to check blood sugar three times daily  and as needed.  Diagnosis: E11.22  Insulin-dependent 07/01/19   [name], MD  calcitRIOL (ROCALTROL) 0.25 MCG capsule Take 0.25 mcg by mouth daily.    [provider]  carvedilol (COREG) 25 MG tablet Take 1 tablet (25 mg total) by mouth 2 (two) times daily with a meal. 04/30/21   Leslye Peer, Richard, MD  cloNIDine (CATAPRES) 0.3 MG tablet TAKE 1 TABLET THREE TIMES DAILY 12/07/20   Minna Merritts, MD  clopidogrel (PLAVIX) 75 MG tablet TAKE 1 TABLET EVERY DAY 09/29/20   Loel Dubonnet, NP  dorzolamide-timolol (COSOPT) 22.3-6.8 MG/ML ophthalmic solution Place 1 drop into both eyes in the morning and at bedtime. 12/05/19   [provider]  ezetimibe (ZETIA) 10 MG tablet TAKE 1 TABLET EVERY DAY 09/29/20   Minna Merritts, MD  fluticasone (FLONASE) 50 MCG/ACT nasal spray Place 2 sprays into both nostrils daily. 11/25/20   [name], MD  Fluticasone-Umeclidin-Vilant (TRELEGY ELLIPTA) 200-62.5-25 MCG/INH AEPB Inhale 1 puff into the lungs daily. 04/05/21   Tyler Pita, MD  gabapentin (NEURONTIN) 100 MG capsule Take 1 capsule (100 mg total) by mouth 2 (two) times daily. 04/30/21   Loletha Grayer, MD  hydrALAZINE (APRESOLINE) 100 MG tablet Take 1 tablet (100 mg total) by mouth 3 (three) times daily. 04/30/21   Loletha Grayer, MD  insulin aspart (NOVOLOG) 100 UNIT/ML FlexPen Inject 8 Units into the skin 3 (three) times daily with meals. (Okay to substitute generic or change to another short acting insulin that is covered) 04/30/21   Loletha Grayer, MD  insulin detemir (LEVEMIR) 100 UNIT/ML FlexPen Inject 30 Units into the skin at bedtime. 04/30/21   Loletha Grayer, MD  Insulin Pen Needle 34G X 3.5 MM MISC 1 Dose by Does not apply route 3 (three) times daily. 04/30/21   Loletha Grayer, MD  isosorbide mononitrate (IMDUR) 120 MG 24 hr tablet Take 1 tablet (120 mg total) by mouth daily. 05/01/21   Loletha Grayer, MD  levothyroxine (SYNTHROID) 25 MCG tablet take 2 of  the 25 mcg tablets Monday through Saturday with 1 tablet on Sunday.  13 pills/week. 02/23/21   [name], MD  montelukast (SINGULAIR) 10 MG tablet Take 1 tablet by mouth once daily 04/05/21   Tyler Pita, MD  nitroGLYCERIN (NITROSTAT) 0.4 MG SL tablet Place 1 tablet (0.4 mg total) under the tongue every 5 (five) minutes as needed. May repeat  x3 03/27/19   [name], MD  omeprazole (PRILOSEC) 20 MG capsule Take 20 mg by mouth daily before breakfast.    [provider]  polyethylene glycol (MIRALAX / GLYCOLAX) 17 g packet Take 17 g by mouth daily as needed for moderate constipation. 04/30/21   Loletha Grayer, MD  sodium chloride (OCEAN) 0.65 % SOLN nasal spray Place 1 spray into both nostrils as needed for congestion. 04/30/21   Loletha Grayer, MD  tamsulosin (FLOMAX) 0.4 MG CAPS capsule TAKE 1 CAPSULE AT BEDTIME 01/12/21   [name], MD  torsemide (DEMADEX) 20 MG tablet Take 2 tablets (40 mg) by mouth once daily 12/14/20   Marrianne Mood D, PA-C  vitamin B-12 1000 MCG tablet Take 1 tablet (1,000 mcg total) by mouth daily. 05/01/21   Loletha Grayer, MD    Allergies Calcium channel blockers  Family History  Problem Relation Age of Onset   Heart attack Father    Heart attack Mother    Heart attack Brother    COPD Sister    Arthritis Maternal Grandmother    Breast cancer Maternal Aunt    Diabetes Maternal Aunt    Hypertension Other    Hypertension Maternal Aunt    Prostate cancer Neg Hx    Kidney cancer Neg Hx    Bladder Cancer Neg Hx    Colon cancer Neg Hx     Social History Social History   Tobacco Use   Smoking status: Never   Smokeless tobacco: Never  Vaping Use   Vaping Use: Never used  Substance Use Topics   Alcohol use: No    Alcohol/week: 0.0 standard drinks   Drug use: No    Review of Systems Constitutional: No fever/chills Eyes: No visual changes. ENT: No sore throat. Cardiovascular: Endorses chest pain. Respiratory:  Endorses shortness of breath. Gastrointestinal: No abdominal pain.  No nausea, no vomiting.  No diarrhea. Genitourinary: Negative for dysuria. Musculoskeletal: Negative for acute arthralgias Skin: Negative for rash. Neurological: Negative for headaches, weakness/numbness/paresthesias in any extremity Psychiatric: Negative for suicidal ideation/homicidal ideation   ____________________________________________   PHYSICAL EXAM:  VITAL SIGNS: ED Triage Vitals  Enc Vitals Group     BP 04/05/21 1634 (!) 165/87     Pulse Rate 04/05/21 1634 (!) 57     Resp 04/05/21 1634 (!) 21     Temp 04/05/21 1634 98.3 F (36.8 C)     Temp Source 04/05/21 1634 Oral     SpO2 04/05/21 1634 99 %     Weight --      Height 04/05/21 1635 $RemoveBefor'5\' 5"'[id number]$  (1.651 m)     Head Circumference --      Peak Flow --      Pain Score 04/05/21 1635 0     Pain Loc --      Pain Edu? --      Excl. in [place]? --    Constitutional: Alert and oriented. Well appearing and in no acute distress. Eyes: Conjunctivae are normal. PERRL. Head: Atraumatic. Nose: No congestion/rhinnorhea. Mouth/Throat: Mucous membranes are moist. Neck: No stridor Cardiovascular: Grossly normal heart sounds.  Good peripheral circulation. Respiratory: Normal respiratory effort on 6 L nasal cannula.  No retractions. Gastrointestinal: Soft and nontender. No distention. Musculoskeletal: No obvious deformities Neurologic:  Normal speech and language. No gross focal neurologic deficits are appreciated. Skin:  Skin is warm and dry. No rash noted. Psychiatric: Mood and affect are normal. Speech and behavior are normal.  ____________________________________________   LABS (all labs  ordered are listed, but only abnormal results are displayed)  Labs Reviewed  BRAIN NATRIURETIC PEPTIDE - Abnormal; Notable for the following components:      Result Value   B Natriuretic Peptide 224.1 (*)    All other components within normal limits  CBC - Abnormal; Notable for  the following components:   RBC 3.95 (*)    Hemoglobin 11.6 (*)    HCT 35.7 (*)    All other components within normal limits  COMPREHENSIVE METABOLIC PANEL - Abnormal; Notable for the following components:   Glucose, Bld 101 (*)    BUN 36 (*)    Creatinine, Ser 2.91 (*)    Calcium 8.3 (*)    Albumin 2.9 (*)    GFR, Estimated 22 (*)    All other components within normal limits  TROPONIN I (HIGH SENSITIVITY) - Abnormal; Notable for the following components:   Troponin I (High Sensitivity) 92 (*)    All other components within normal limits  TROPONIN I (HIGH SENSITIVITY) - Abnormal; Notable for the following components:   Troponin I (High Sensitivity) 107 (*)    All other components within normal limits   ____________________________________________  EKG  ED ECG REPORT I, Naaman Plummer, the attending physician, personally viewed and interpreted this ECG.  Date: 05/12/2021 EKG Time: 1633 Rate: 53 Rhythm: Bradycardic sinus rhythm QRS Axis: normal Intervals: First-degree block ST/T Wave abnormalities: normal Narrative Interpretation: Bradycardic sinus rhythm with first-degree AV block.  No evidence of acute ischemia  ____________________________________________  RADIOLOGY  ED MD interpretation: Single view portable x-ray shows a new patchy opacity in the right midlung concerning for pneumonia  CT of the chest with out contrast shows idiopathic pulmonary fibrosis with  a suspected superimposed component of a diffuse inflammatory  process    Official radiology report(s): No results found.  ____________________________________________   PROCEDURES  Procedure(s) performed (including Critical Care):  Procedures   ____________________________________________   INITIAL IMPRESSION / ASSESSMENT AND PLAN / ED COURSE  As part of my medical decision making, I reviewed the following data within the electronic medical record, if available:  Nursing notes reviewed and  incorporated, Labs reviewed, EKG interpreted, Old chart reviewed, Radiograph reviewed and Notes from prior ED visits reviewed and incorporated        The patient is suffering from shortness of breath, but the immediate cause is not apparent.  Potential causes considered include, but are not limited to, asthma or COPD, congestive heart failure, pulmonary embolism, pneumothorax, coronary syndrome, pneumonia, and pleural effusion.  F symptoms has resolved after coughing, patient likely had mucous plugging causing intermittent hypoxia and was given instructions on clearing mucus as well as strict return precautions.  Despite the evaluation including history, exam, and testing, the cause of the shortness of breath remains unclear. However, during the ED stay, patients condition improved, and at the time of discharge the shortness of breath is resolved, they are feeling well, and want to go home.  Patient will be discharged with strict return precautions and advice to follow up with primary MD within 24 hours for further evaluation.      ____________________________________________   FINAL CLINICAL IMPRESSION(S) / ED DIAGNOSES  Final diagnoses:  SOB (shortness of breath)  Mucus plugging of bronchi     ED Discharge Orders     None        Note:  This document was prepared using Dragon voice recognition software and may include unintentional dictation errors.    Naaman Plummer,  MD 05/12/21 0761

## 2021-05-12 NOTE — Consult Note (Signed)
[facility] Face-to-Face Psychiatry Consult   Reason for Consult: Consult for 75 year old man with multiple severe medical problems.  Concern on the part of the treatment team about possible depression Referring Physician:  Hilton Sinclair Patient Identification: Stephen [name] MRN:  [id number] Principal Diagnosis: Moderate major depression, single episode [facility]) Diagnosis:  Principal Problem:   Moderate major depression, single episode (HCC) Active Problems:   Acute on chronic diastolic CHF (congestive heart failure) (HCC)   Elevated troponin   CHF (congestive heart failure) (HCC)   Unstable angina (HCC)   Pulmonary hypertension (HCC)   Acute on chronic respiratory failure with hypoxia and hypercapnia (HCC)   CKD (chronic kidney disease), stage IV (HCC)   Interstitial lung disease (HCC)   B12 deficiency   Pressure injury of skin   Total Time spent with patient: 1 hour  Subjective:   Stephen [name] is a 75 y.o. male patient admitted with "it has been a big shock".  HPI: Patient seen chart reviewed.  Also spoke with the patient's wife who is in the room.  70 year old man who has been in the hospital since the end of October.  Came in with worsening breathing problems and chest pain.  Patient has advanced lung disease and coronary artery disease.  He tells me the worst thing about his situation is losing his ability to do things outdoors in his yard like he used to.  Both he and his wife describe how important it was to him to get out in his yard and ride his lawnmower and do things.  It provided a way for him to make contact with neighbors and feel useful.  When his oxygen saturations dropped to where he could no longer use a portable oxygen machine this was a big loss for him because it meant he could no longer ride the lawnmower.  Patient says his goals is to try to get back to being able to do some activities.  I suggested that that would include being able to get up out of bed stand up and walk  around on his own all of which she agreed to.  I ask him whether he was able to do what physical therapy was asking of him.  He told me that today they had placed him in a chair in of manner that was painful to him and that he was angry that they would not allow him to get out of the chair.  Patient appears to be blunted and flat.  Little emotional tone.  Speaks slowly.  Vaguely acknowledges being in a low mood.  Denies any suicidal thoughts whatsoever.  Denies having any trouble sleeping.  Admits that he is not eating well and has little interest in eating.  Denies any psychotic symptoms.  Past Psychiatric History: No past psychiatric history.  No past treatment for any kind of mental health or anxiety issue.  No history of suicide attempts.  No history of substance abuse  Risk to Self:   Risk to Others:   Prior Inpatient Therapy:   Prior Outpatient Therapy:    Past Medical History:  Past Medical History:  Diagnosis Date   Adrenal gland anomaly    Arthritis    CAD (coronary artery disease)    a. 04/2006 MI and PCI/stenting to mLCx & mRCA; b. 07/2009 Cath: patent LCX/RCA stents;  c. 12/2016 NSTEMI/Cath: LM 30, LAD 30p, 10m, 30d, D1/2/3 min irregs, LCX 161m ISR, OM1 min irregs, RCA 20p, 10 ISR, 65m, 50d, RPDA min irregs, RPAV  40-->Med Rx; d. 12/2017 MV: fixed lateral wall scar, mild anterior/anterior septal ischemia. EF 30-44%.   CHF (congestive heart failure) (HCC)    CKD (chronic kidney disease), stage III (HCC)    Colon polyps    DM2 (diabetes mellitus, type 2) (HCC)    insulin requiring   Dyslipidemia    GERD (gastroesophageal reflux disease)    History of echocardiogram    a. TTE 12/2016: EF 50-55%, mod concentric LVH, images inadequate for wall motion assessment, not technically sufficienct to allow for LV dias fxn, calcified mitral annulus    HTN (hypertension)    Kidney stones    Left Renal artery stenosis (HCC)    a. 07/2013 s/p PTA/stenting ([facility]); b. 07/2017 Renal Duplex: No  significant RAS.   Myocardial infarction (HCC)    OSA (obstructive sleep apnea)    Pulmonary fibrosis (HCC)    a. 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   Recurrent UTI    Rotator cuff disorder    right   Skin cancer    head   Thyroid disorder    Venous insufficiency of both lower extremities    a. s/p laser treatment.    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   CARPAL TUNNEL RELEASE     bilateral   CATARACT EXTRACTION W/PHACO Right 07/08/2018   Procedure: CATARACT EXTRACTION PHACO AND INTRAOCULAR LENS PLACEMENT (IOC) RIGHT DIABETIC;  Surgeon: Nevada Crane, MD;  Location: [facility];  Service: Ophthalmology;  Laterality: Right;  Diabetic - insulin sleep apnea   CATARACT EXTRACTION W/PHACO Left 08/05/2018   Procedure: CATARACT EXTRACTION PHACO AND INTRAOCULAR LENS PLACEMENT (IOC) LEFT DIABETIC;  Surgeon: Nevada Crane, MD;  Location: [facility];  Service: Ophthalmology;  Laterality: Left;  DIABETIC   CORONARY ANGIOGRAPHY N/A 08/19/2020   Procedure: CORONARY ANGIOGRAPHY;  Surgeon: Antonieta Iba, MD;  Location: [facility];  Service: Cardiovascular;  Laterality: N/A;   CORONARY ANGIOPLASTY WITH STENT PLACEMENT  2007   x 2   KNEE SURGERY     right   LEFT HEART CATH AND CORONARY ANGIOGRAPHY N/A 01/11/2017   Procedure: Left Heart Cath and Coronary Angiography;  Surgeon: Iran Ouch, MD;  Location: [facility];  Service: Cardiovascular;  Laterality: N/A;   LUNG BIOPSY     RENAL ARTERY STENT  2015   L   RIGHT HEART CATH Right 12/06/2020   Procedure: RIGHT HEART CATH;  Surgeon: Iran Ouch, MD;  Location: [facility] CV [facility];  Service: Cardiovascular;  Laterality: Right;   SKIN CANCER EXCISION     back of head   Family History:  Family History  Problem Relation Age of Onset   Heart attack Father    Heart attack Mother     Heart attack Brother    COPD Sister    Arthritis Maternal Grandmother    Breast cancer Maternal Aunt    Diabetes Maternal Aunt    Hypertension Other    Hypertension Maternal Aunt    Prostate cancer Neg Hx    Kidney cancer Neg Hx    Bladder Cancer Neg Hx    Colon cancer Neg Hx    Family Psychiatric  History: None reported Social History:  Social History   Substance and Sexual Activity  Alcohol Use No   Alcohol/week: 0.0 standard drinks     Social History   Substance and Sexual Activity  Drug Use No    Social History   Socioeconomic History   Marital status: Married    Spouse name: Not on file   Number of children: 2   Years of education: Not on file   Highest education level: Not on file  Occupational History   Occupation: retired from service station work, 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    Comment: significant metal dust exposure, significant asbestos exposure    Employer: DISABLED  Tobacco Use   Smoking status: Never   Smokeless tobacco: Never  Vaping Use   Vaping Use: Never used  Substance and Sexual Activity   Alcohol use: No    Alcohol/week: 0.0 standard drinks   Drug use: No   Sexual activity: Yes  Other Topics Concern   Not on file  Social History Narrative   Enjoys fishing   Public librarian   Social Determinants of Health   Financial Resource Strain: Low Risk    Difficulty of Paying Living Expenses: Not hard at all  Food Insecurity: No Food Insecurity   Worried About Programme researcher, broadcasting/film/video in the Last Year: Never true   Barista in the Last Year: Never true  Transportation Needs: No Transportation Needs   Lack of Transportation (Medical): No   Lack of Transportation (Non-Medical): No  Physical Activity: Inactive   Days of Exercise per Week: 0 days   Minutes of Exercise per Session: 0 min  Stress: No Stress Concern Present   Feeling of Stress : Not at all  Social Connections: Not on file   Additional Social History:    Allergies:   Allergies   Allergen Reactions   Calcium Channel Blockers     Would avoid if possible due to h/o peripheral edema    Labs:  Results for orders placed or performed during the hospital encounter of 05/06/21 (from the past 48 hour(s))  Glucose, capillary     Status: Abnormal   Collection Time: 05/10/21  8:30 PM  Result Value Ref Range   Glucose-Capillary 132 (H) 70 - 99 mg/dL    Comment: Glucose reference range applies only to samples taken after fasting for at least 8 hours.  Renal function panel     Status: Abnormal   Collection Time: 05/11/21  4:22 AM  Result Value Ref Range   Sodium 137 135 - 145 mmol/L   Potassium 3.5 3.5 - 5.1 mmol/L   Chloride 96 (L) 98 - 111 mmol/L   CO2 33 (H) 22 - 32 mmol/L   Glucose, Bld 215 (H) 70 - 99 mg/dL    Comment: Glucose reference range applies only to samples taken after fasting for at least 8 hours.   BUN 85 (H) 8 - 23 mg/dL   Creatinine, Ser 1.61 (H) 0.61 - 1.24 mg/dL   Calcium 8.0 (L) 8.9 - 10.3 mg/dL   Phosphorus 5.5 (H) 2.5 - 4.6 mg/dL   Albumin 1.9 (L) 3.5 - 5.0 g/dL   GFR, Estimated 17 (L) >60 mL/min    Comment: (NOTE) Calculated using the CKD-EPI Creatinine Equation (2021)    Anion gap 8 5 - 15    Comment: Performed at [facility], [address] [place], [place] [address]  CBC with Differential/Platelet     Status: Abnormal   Collection Time: 05/11/21  4:22 AM  Result Value Ref Range   WBC 3.9 (L) 4.0 - 10.5 K/uL   RBC 3.11 (L) 4.22 - 5.81 MIL/uL   Hemoglobin 8.4 (L) 13.0 - 17.0 g/dL  HCT 27.6 (L) 39.0 - 52.0 %   MCV 88.7 80.0 - 100.0 fL   MCH 27.0 26.0 - 34.0 pg   MCHC 30.4 30.0 - 36.0 g/dL   RDW 78.2 (H) 95.6 - 21.3 %   Platelets 246 150 - 400 K/uL   nRBC 0.0 0.0 - 0.2 %   Neutrophils Relative % 66 %   Neutro Abs 2.6 1.7 - 7.7 K/uL   Lymphocytes Relative 15 %   Lymphs Abs 0.6 (L) 0.7 - 4.0 K/uL   Monocytes Relative 14 %   Monocytes Absolute 0.6 0.1 - 1.0 K/uL   Eosinophils Relative 4 %   Eosinophils Absolute 0.2 0.0 -  0.5 K/uL   Basophils Relative 1 %   Basophils Absolute 0.0 0.0 - 0.1 K/uL   Immature Granulocytes 0 %   Abs Immature Granulocytes 0.01 0.00 - 0.07 K/uL    Comment: Performed at [facility], [address] [place], [place] [address]  Glucose, capillary     Status: Abnormal   Collection Time: 05/11/21  8:02 AM  Result Value Ref Range   Glucose-Capillary 164 (H) 70 - 99 mg/dL    Comment: Glucose reference range applies only to samples taken after fasting for at least 8 hours.  Glucose, capillary     Status: Abnormal   Collection Time: 05/11/21 12:19 PM  Result Value Ref Range   Glucose-Capillary 233 (H) 70 - 99 mg/dL    Comment: Glucose reference range applies only to samples taken after fasting for at least 8 hours.  Glucose, capillary     Status: Abnormal   Collection Time: 05/11/21  4:21 PM  Result Value Ref Range   Glucose-Capillary 264 (H) 70 - 99 mg/dL    Comment: Glucose reference range applies only to samples taken after fasting for at least 8 hours.  Glucose, capillary     Status: Abnormal   Collection Time: 05/11/21  8:20 PM  Result Value Ref Range   Glucose-Capillary 172 (H) 70 - 99 mg/dL    Comment: Glucose reference range applies only to samples taken after fasting for at least 8 hours.  Renal function panel     Status: Abnormal   Collection Time: 05/12/21  4:28 AM  Result Value Ref Range   Sodium 136 135 - 145 mmol/L   Potassium 3.2 (L) 3.5 - 5.1 mmol/L   Chloride 94 (L) 98 - 111 mmol/L   CO2 34 (H) 22 - 32 mmol/L   Glucose, Bld 162 (H) 70 - 99 mg/dL    Comment: Glucose reference range applies only to samples taken after fasting for at least 8 hours.   BUN 86 (H) 8 - 23 mg/dL   Creatinine, Ser 8.46 (H) 0.61 - 1.24 mg/dL   Calcium 7.7 (L) 8.9 - 10.3 mg/dL   Phosphorus 4.8 (H) 2.5 - 4.6 mg/dL   Albumin 1.9 (L) 3.5 - 5.0 g/dL   GFR, Estimated 19 (L) >60 mL/min    Comment: (NOTE) Calculated using the CKD-EPI Creatinine Equation (2021)    Anion gap 8 5 -  15    Comment: Performed at [facility], [address] [place], [place] [address]  CBC with Differential/Platelet     Status: Abnormal   Collection Time: 05/12/21  4:29 AM  Result Value Ref Range   WBC 4.3 4.0 - 10.5 K/uL   RBC 3.09 (L) 4.22 - 5.81 MIL/uL   Hemoglobin 8.5 (L) 13.0 - 17.0 g/dL   HCT 28.4 (L) 13.2 -  52.0 %   MCV 89.6 80.0 - 100.0 fL   MCH 27.5 26.0 - 34.0 pg   MCHC 30.7 30.0 - 36.0 g/dL   RDW 40.9 81.1 - 91.4 %   Platelets 266 150 - 400 K/uL   nRBC 0.0 0.0 - 0.2 %   Neutrophils Relative % 64 %   Neutro Abs 2.7 1.7 - 7.7 K/uL   Lymphocytes Relative 17 %   Lymphs Abs 0.7 0.7 - 4.0 K/uL   Monocytes Relative 12 %   Monocytes Absolute 0.5 0.1 - 1.0 K/uL   Eosinophils Relative 6 %   Eosinophils Absolute 0.3 0.0 - 0.5 K/uL   Basophils Relative 1 %   Basophils Absolute 0.0 0.0 - 0.1 K/uL   Immature Granulocytes 0 %   Abs Immature Granulocytes 0.01 0.00 - 0.07 K/uL    Comment: Performed at [facility], [address] [place], [place] [address]  Glucose, capillary     Status: Abnormal   Collection Time: 05/12/21  8:02 AM  Result Value Ref Range   Glucose-Capillary 144 (H) 70 - 99 mg/dL    Comment: Glucose reference range applies only to samples taken after fasting for at least 8 hours.  Magnesium     Status: None   Collection Time: 05/12/21 10:17 AM  Result Value Ref Range   Magnesium 2.0 1.7 - 2.4 mg/dL    Comment: Performed at [facility], [address] [place], [place] [address]  Glucose, capillary     Status: Abnormal   Collection Time: 05/12/21 11:34 AM  Result Value Ref Range   Glucose-Capillary 239 (H) 70 - 99 mg/dL    Comment: Glucose reference range applies only to samples taken after fasting for at least 8 hours.  Glucose, capillary     Status: Abnormal   Collection Time: 05/12/21  4:46 PM  Result Value Ref Range   Glucose-Capillary 268 (H) 70 - 99 mg/dL    Comment: Glucose reference range applies only to samples  taken after fasting for at least 8 hours.    Current Facility-Administered Medications  Medication Dose Route Frequency Provider Last Rate Last Admin   0.9 %  sodium chloride infusion   Intravenous PRN Manuela Schwartz, NP 10 mL/hr at 05/11/21 2107 New Bag at 05/11/21 2107   acetaminophen (TYLENOL) tablet 650 mg  650 mg Oral Q4H PRN Ronnald Ramp, RPH   650 mg at 05/12/21 1041   albuterol (PROVENTIL) (2.5 MG/3ML) 0.083% nebulizer solution 2.5 mg  2.5 mg Inhalation Q6H PRN Alford Highland, MD   2.5 mg at 05/08/21 2158   ALPRAZolam Prudy Feeler) tablet 0.5 mg  0.5 mg Oral TID PRN Burnadette Pop, MD   0.5 mg at 05/09/21 2226   aspirin EC tablet 81 mg  81 mg Oral Daily Andris Baumann, MD   81 mg at 05/12/21 0910   atorvastatin (LIPITOR) tablet 80 mg  80 mg Oral QHS Andris Baumann, MD   80 mg at 05/11/21 2257   calcitRIOL (ROCALTROL) capsule 0.25 mcg  0.25 mcg Oral Daily Alford Highland, MD   0.25 mcg at 05/12/21 0912   carvedilol (COREG) tablet 25 mg  25 mg Oral BID WC Lindajo Royal V, MD   25 mg at 05/12/21 0911   cefTRIAXone (ROCEPHIN) 1 g in sodium chloride 0.9 % 100 mL IVPB  1 g Intravenous Q24H Burnadette Pop, MD 200 mL/hr at 05/11/21 2108 1 g at 05/11/21 2108   Chlorhexidine Gluconate Cloth 2 % PADS 6  each  6 each Topical Daily Myrtie Neither, MD   6 each at 05/12/21 0913   cloNIDine (CATAPRES) tablet 0.1 mg  0.1 mg Oral TID End, Cristal Deer, MD   0.1 mg at 05/12/21 0912   clopidogrel (PLAVIX) tablet 75 mg  75 mg Oral Daily Alford Highland, MD   75 mg at 05/12/21 0912   dorzolamide-timolol (COSOPT) 22.3-6.8 MG/ML ophthalmic solution 1 drop  1 drop Both Eyes BID Alford Highland, MD   1 drop at 05/12/21 0913   epoetin alfa (EPOGEN) injection 3,000 Units  3,000 Units Subcutaneous Q14 Days Randa Lynn, MD   3,000 Units at 05/09/21 0609   [START ON 05/13/2021] escitalopram (LEXAPRO) tablet 5 mg  5 mg Oral Daily Burl Tauzin, Jackquline Denmark, MD       ezetimibe (ZETIA) tablet 10 mg  10 mg Oral Daily  Wieting, Richard, MD   10 mg at 05/12/21 0912   feeding supplement (ENSURE ENLIVE / ENSURE PLUS) liquid 237 mL  237 mL Oral BID BM Breeze, Shantelle, NP   237 mL at 05/12/21 0913   fluticasone furoate-vilanterol (BREO ELLIPTA) 200-25 MCG/ACT 1 puff  1 puff Inhalation Daily Alford Highland, MD   1 puff at 05/12/21 0913   And   umeclidinium bromide (INCRUSE ELLIPTA) 62.5 MCG/ACT 1 puff  1 puff Inhalation Daily Alford Highland, MD   1 puff at 05/12/21 0913   furosemide (LASIX) tablet 80 mg  80 mg Oral BID Wendee Beavers, NP   80 mg at 05/12/21 0911   gabapentin (NEURONTIN) capsule 100 mg  100 mg Oral BID Alford Highland, MD   100 mg at 05/12/21 0912   hydrALAZINE (APRESOLINE) tablet 25 mg  25 mg Oral TID Antonieta Iba, MD   25 mg at 05/12/21 0911   HYDROcodone-acetaminophen (NORCO/VICODIN) 5-325 MG per tablet 1 tablet  1 tablet Oral Q6H PRN Burnadette Pop, MD   1 tablet at 05/11/21 2035   insulin aspart (novoLOG) injection 0-20 Units  0-20 Units Subcutaneous TID WC Lindajo Royal V, MD   7 Units at 05/12/21 1254   insulin aspart (novoLOG) injection 0-5 Units  0-5 Units Subcutaneous QHS Andris Baumann, MD   2 Units at 05/07/21 2202   insulin detemir (LEVEMIR) injection 30 Units  30 Units Subcutaneous QHS Alford Highland, MD   30 Units at 05/11/21 2301   isosorbide mononitrate (IMDUR) 24 hr tablet 120 mg  120 mg Oral Daily Lindajo Royal V, MD   120 mg at 05/12/21 0911   levothyroxine (SYNTHROID) tablet 50 mcg  50 mcg Oral Q0600 Andris Baumann, MD   50 mcg at 05/12/21 0540   [START ON 05/13/2021] metolazone (ZAROXOLYN) tablet 5 mg  5 mg Oral Once Wendee Beavers, NP       montelukast (SINGULAIR) tablet 10 mg  10 mg Oral Daily Alford Highland, MD   10 mg at 05/12/21 0911   nitroGLYCERIN (NITROSTAT) SL tablet 0.4 mg  0.4 mg Sublingual Q5 min PRN Alford Highland, MD       ondansetron [facility]) injection 4 mg  4 mg Intravenous Q6H PRN Andris Baumann, MD   4 mg at 05/04/21 1731   pantoprazole  (PROTONIX) EC tablet 40 mg  40 mg Oral Daily Alford Highland, MD   40 mg at 05/12/21 0911   polyethylene glycol (MIRALAX / GLYCOLAX) packet 17 g  17 g Oral Daily PRN Alford Highland, MD   17 g at 05/06/21 2101   potassium chloride SA (KLOR-CON) CR tablet  40 mEq  40 mEq Oral BID Wendee Beavers, NP   40 mEq at 05/12/21 4010   sodium chloride flush (NS) 0.9 % injection 3 mL  3 mL Intravenous Q12H Cipriano Bunker, MD   3 mL at 05/12/21 0912   tamsulosin (FLOMAX) capsule 0.4 mg  0.4 mg Oral QHS Alford Highland, MD   0.4 mg at 05/11/21 2257   vitamin B-12 (CYANOCOBALAMIN) tablet 1,000 mcg  1,000 mcg Oral Daily Alford Highland, MD   1,000 mcg at 05/12/21 0911    Musculoskeletal: Strength & Muscle Tone: decreased Gait & Station: unsteady Patient leans: N/A            Psychiatric Specialty Exam:  Presentation  General Appearance: No data recorded Eye Contact:No data recorded Speech:No data recorded Speech Volume:No data recorded Handedness:No data recorded  Mood and Affect  Mood:No data recorded Affect:No data recorded  Thought Process  Thought Processes:No data recorded Descriptions of Associations:No data recorded Orientation:No data recorded Thought Content:No data recorded History of Schizophrenia/Schizoaffective disorder:No data recorded Duration of Psychotic Symptoms:No data recorded Hallucinations:No data recorded Ideas of Reference:No data recorded Suicidal Thoughts:No data recorded Homicidal Thoughts:No data recorded  Sensorium  Memory:No data recorded Judgment:No data recorded Insight:No data recorded  Executive Functions  Concentration:No data recorded Attention Span:No data recorded Recall:No data recorded Fund of Knowledge:No data recorded Language:No data recorded  Psychomotor Activity  Psychomotor Activity:No data recorded  Assets  Assets:No data recorded  Sleep  Sleep:No data recorded  Physical Exam: Physical Exam Vitals and nursing  note reviewed.  Constitutional:      Appearance: Normal appearance.  HENT:     Head: Normocephalic and atraumatic.     Mouth/Throat:     Pharynx: Oropharynx is clear.  Eyes:     Pupils: Pupils are equal, round, and reactive to light.  Cardiovascular:     Rate and Rhythm: Normal rate and regular rhythm.  Pulmonary:     Effort: Pulmonary effort is normal.     Breath sounds: Normal breath sounds.  Abdominal:     General: Abdomen is flat.     Palpations: Abdomen is soft.  Musculoskeletal:        General: Normal range of motion.  Skin:    General: Skin is warm and dry.  Neurological:     General: No focal deficit present.     Mental Status: He is alert. Mental status is at baseline.  Psychiatric:        Attention and Perception: Attention normal.        Mood and Affect: Mood normal. Affect is blunt.        Speech: Speech is delayed.        Behavior: Behavior is cooperative.        Thought Content: Thought content normal.   Review of Systems  Constitutional:  Positive for malaise/fatigue.  HENT: Negative.    Eyes: Negative.   Respiratory: Negative.    Cardiovascular: Negative.   Gastrointestinal: Negative.   Musculoskeletal: Negative.   Skin: Negative.   Neurological: Negative.   Psychiatric/Behavioral:  Positive for depression. Negative for hallucinations, substance abuse and suicidal ideas. The patient does not have insomnia.   Blood pressure (!) 148/51, pulse (!) 58, temperature 97.7 F (36.5 C), resp. rate 17, height 5\' 5"  (1.651 m), weight 110.3 kg, SpO2 98 %. Body mass index is 40.45 kg/m.  Treatment Plan Summary: Medication management and Plan 75 year old man with severe medical problems which have caused him to lose the ability to do  a lot of his normal functioning.  Patient claimed to me that he felt optimistic about being able to regain his function but admitted that it upset him when some of the treatment team suggested that he may not be able to get back to  functioning normally.  As I of course pointed out to him and his wife that can be hard to identify a line between depression and just being tired out especially when one is sick but I think that treating for depression in a situation like this is a worthwhile option.  Depression is very common in heart disease and acute and chronic illness in older people.  Treatment can be safe and effective.  I proposed to him that we start Lexapro 5 mg a day which generally is very well tolerated with few to no side effects.  Patient and wife discussed it and agreed to the plan.  I have ordered that medicine to start tomorrow.  If he tolerates it okay for a few days it can be safely increased to 10 mg.  If he does not see any improvement in a couple weeks it should be addressed and possibly changed.  Encouraged patient to try and eat as well as he could and to put as much energy as possible into his recovery.  Will follow up as needed.  Disposition: No evidence of imminent risk to self or others at present.   Patient does not meet criteria for psychiatric inpatient admission. Supportive therapy provided about ongoing stressors.  Mordecai Rasmussen, MD 05/12/2021 4:51 PM

## 2021-05-12 NOTE — Progress Notes (Signed)
Inpatient Diabetes Program Recommendations  AACE/ADA: New Consensus Statement on Inpatient Glycemic Control (2015)  Target Ranges:  Prepandial:   less than 140 mg/dL      Peak postprandial:   less than 180 mg/dL (1-2 hours)      Critically ill patients:  140 - 180 mg/dL   Lab Results  Component Value Date   GLUCAP 239 (H) 05/12/2021   HGBA1C 7.5 (H) 04/20/2021    Review of Glycemic Control Results for Stephen [name], Stephen [name] (MRN [id number]) as of 05/12/2021 14:02  Ref. Range 05/11/2021 16:21 05/11/2021 20:20 05/12/2021 08:02 05/12/2021 11:34  Glucose-Capillary Latest Ref Range: 70 - 99 mg/dL 264 (H) 172 (H) 144 (H) 239 (H)   Diabetes history: DM 2 Outpatient Diabetes medications:  Novolog 8 units tid with meals, Levemir 30 units q HS Current orders for Inpatient glycemic control:  Novolog resistant tid with meals and HS Levemir 30 units q HS  Inpatient Diabetes Program Recommendations:   If appropriate, consider adding Novolog 3 units tid with meals (hold if patient eats less than 50% or NPO).   Thanks,  Adah Perl, RN, BC-ADM Inpatient Diabetes Coordinator Pager [phone]  (8a-5p)

## 2021-05-13 DIAGNOSIS — R0602 Shortness of breath: Secondary | ICD-10-CM

## 2021-05-13 DIAGNOSIS — F321 Major depressive disorder, single episode, moderate: Secondary | ICD-10-CM

## 2021-05-13 DIAGNOSIS — I214 Non-ST elevation (NSTEMI) myocardial infarction: Secondary | ICD-10-CM

## 2021-05-13 DIAGNOSIS — R338 Other retention of urine: Secondary | ICD-10-CM

## 2021-05-13 DIAGNOSIS — L8990 Pressure ulcer of unspecified site, unspecified stage: Secondary | ICD-10-CM

## 2021-05-13 DIAGNOSIS — Z960 Presence of urogenital implants: Secondary | ICD-10-CM

## 2021-05-13 DIAGNOSIS — J9621 Acute and chronic respiratory failure with hypoxia: Secondary | ICD-10-CM | POA: Diagnosis not present

## 2021-05-13 DIAGNOSIS — I5033 Acute on chronic diastolic (congestive) heart failure: Secondary | ICD-10-CM | POA: Diagnosis not present

## 2021-05-13 DIAGNOSIS — R310 Gross hematuria: Secondary | ICD-10-CM

## 2021-05-13 DIAGNOSIS — R0789 Other chest pain: Secondary | ICD-10-CM | POA: Diagnosis not present

## 2021-05-13 LAB — CBC
HCT: 27.3 % — ABNORMAL LOW (ref 39.0–52.0)
Hemoglobin: 8.5 g/dL — ABNORMAL LOW (ref 13.0–17.0)
MCH: 27.7 pg (ref 26.0–34.0)
MCHC: 31.1 g/dL (ref 30.0–36.0)
MCV: 88.9 fL (ref 80.0–100.0)
Platelets: 263 10*3/uL (ref 150–400)
RBC: 3.07 MIL/uL — ABNORMAL LOW (ref 4.22–5.81)
RDW: 15.6 % — ABNORMAL HIGH (ref 11.5–15.5)
WBC: 4.6 10*3/uL (ref 4.0–10.5)
nRBC: 0 % (ref 0.0–0.2)

## 2021-05-13 LAB — RENAL FUNCTION PANEL
Albumin: 1.9 g/dL — ABNORMAL LOW (ref 3.5–5.0)
Anion gap: 9 (ref 5–15)
BUN: 82 mg/dL — ABNORMAL HIGH (ref 8–23)
CO2: 34 mmol/L — ABNORMAL HIGH (ref 22–32)
Calcium: 8 mg/dL — ABNORMAL LOW (ref 8.9–10.3)
Chloride: 94 mmol/L — ABNORMAL LOW (ref 98–111)
Creatinine, Ser: 3.19 mg/dL — ABNORMAL HIGH (ref 0.61–1.24)
GFR, Estimated: 20 mL/min — ABNORMAL LOW (ref >60)
Glucose, Bld: 193 mg/dL — ABNORMAL HIGH (ref 70–99)
Phosphorus: 3.4 mg/dL (ref 2.5–4.6)
Potassium: 3.5 mmol/L (ref 3.5–5.1)
Sodium: 137 mmol/L (ref 135–145)

## 2021-05-13 LAB — PHOSPHORUS: Phosphorus: 3.7 mg/dL (ref 2.5–4.6)

## 2021-05-13 LAB — BASIC METABOLIC PANEL
Anion gap: 8 (ref 5–15)
BUN: 90 mg/dL — ABNORMAL HIGH (ref 8–23)
CO2: 35 mmol/L — ABNORMAL HIGH (ref 22–32)
Calcium: 8.1 mg/dL — ABNORMAL LOW (ref 8.9–10.3)
Chloride: 96 mmol/L — ABNORMAL LOW (ref 98–111)
Creatinine, Ser: 3.29 mg/dL — ABNORMAL HIGH (ref 0.61–1.24)
GFR, Estimated: 19 mL/min — ABNORMAL LOW (ref >60)
Glucose, Bld: 192 mg/dL — ABNORMAL HIGH (ref 70–99)
Potassium: 3.6 mmol/L (ref 3.5–5.1)
Sodium: 139 mmol/L (ref 135–145)

## 2021-05-13 LAB — GLUCOSE, CAPILLARY
Glucose-Capillary: 168 mg/dL — ABNORMAL HIGH (ref 70–99)
Glucose-Capillary: 160 mg/dL — ABNORMAL HIGH (ref 70–99)
Glucose-Capillary: 182 mg/dL — ABNORMAL HIGH (ref 70–99)
Glucose-Capillary: 217 mg/dL — ABNORMAL HIGH (ref 70–99)

## 2021-05-13 LAB — MAGNESIUM: Magnesium: 2.3 mg/dL (ref 1.7–2.4)

## 2021-05-13 MED ORDER — INSULIN ASPART 100 UNIT/ML IJ SOLN
3.0000 [IU] | Freq: Three times a day (TID) | INTRAMUSCULAR | Status: DC
  Administered 2021-05-13 – 2021-05-19 (×15): 3 [IU] via SUBCUTANEOUS
  Filled 2021-05-13 (×15): qty 1

## 2021-05-13 MED ORDER — ACETAMINOPHEN 325 MG PO TABS
650.0000 mg | ORAL_TABLET | Freq: Three times a day (TID) | ORAL | Status: DC
  Administered 2021-05-13 – 2021-05-19 (×19): 650 mg via ORAL
  Filled 2021-05-13 (×19): qty 2

## 2021-05-13 MED ORDER — METOLAZONE 5 MG PO TABS
5.0000 mg | ORAL_TABLET | Freq: Once | ORAL | Status: AC
  Administered 2021-05-13: 5 mg via ORAL
  Filled 2021-05-13: qty 1

## 2021-05-13 NOTE — Progress Notes (Signed)
Daily Progress Note   Patient Name: Stephen [name]       Date: 05/13/2021 DOB: 03-14-1946  Age: 75 y.o. MRN#: [id number] Attending Physician: Val Riles, MD Primary Care Physician: [name], MD Admit Date: 04/16/2021  Reason for Consultation/Follow-up: Establishing goals of care  Patient Profile/HPI:  75 y.o. male  with past medical history of multivessel coronary disease, chronic heart failure, pulmonary fibrosis, pulmonary hypertension, COPD, chronic kidney disease, diabetes, renal artery stenosis, and interstitial lung disease admitted on 04/21/2021 with acute on chronic heart failure with demand ischemia. Palliative medicine was consulted to discuss goals of care and symptom management. He was requiring frequent bipap and nephrology was consulted. He has not required HD at this point.    Subjective: Stephen [name] lying in bed- his bed he is in is more comfortable. Wife at bedside. PT was not able to work with him today so he has not gotten up to the chair. He used the bedpan, but not the bedside commode.  He continues to have a flat affect, spouse answers questions before he can.  Spouse states they are hoping to get him to rehab ([facility]) soon. When I ask him about it he says, "if it will work".  Pain from sacral ulcers are better with scheduled tylenol.   Physical Exam Vitals and nursing note reviewed.  Pulmonary:     Effort: Pulmonary effort is normal.     Comments: Productive cough           Vital Signs: BP (!) 141/52   Pulse (!) 56   Temp 97.9 F (36.6 C)   Resp 17   Ht 5\' 5"  (1.651 m)   Wt 110.3 kg   SpO2 100%   BMI 40.45 kg/m  SpO2: SpO2: 100 % O2 Device: O2 Device: Nasal Cannula O2 Flow Rate: O2 Flow Rate (L/min): 3  L/min  Intake/output summary:  Intake/Output Summary (Last 24 hours) at 05/13/2021 1602 Last data filed at 05/13/2021 1506 Gross per 24 hour  Intake 340 ml  Output 2050 ml  Net -1710 ml    LBM: Last BM Date: 05/10/21 Baseline Weight: Weight: 114.3 kg Most recent weight: Weight: 110.3 kg       Palliative Assessment/Data: PPS: 30%      Patient Active Problem List   Diagnosis Date Noted  .  Moderate major depression, single episode ([place]) 05/12/2021  . Pressure injury of skin 05/05/2021  . Thrombophlebitis of superficial veins of left lower extremity   . Superficial thrombophlebitis of left upper extremity   . B12 deficiency   . Generalized weakness   . Acute respiratory failure with hypoxia and hypercapnia (HCC)   . Acute metabolic encephalopathy   . Acute on chronic respiratory failure with hypoxia and hypercapnia ([place]) 04/17/2021  . CKD (chronic kidney disease), stage IV ([place])   . Interstitial lung disease ([place])   . COPD with acute exacerbation ([place])   . Pounding heartbeat 03/09/2021  . Bruising 11/29/2020  . Cough 11/29/2020  . Unstable angina ([place]) 08/19/2020  . Pulmonary hypertension ([place]) 08/19/2020  . Tremor 07/29/2020  . Hypoxia 03/21/2020  . Left leg cellulitis 09/16/2019  . Asthma 03/31/2019  . Renal artery stenosis ([place]) 02/24/2019  . Advance care planning 12/26/2018  . Hypothyroidism 12/26/2018  . HLD (hyperlipidemia) 12/26/2018  . Coronary artery disease involving native coronary artery of native heart with angina pectoris ([place]) 10/09/2018  . Pulmonary hypertension, primary ([place]) 09/26/2018  . CHF (congestive heart failure) ([place]) 09/20/2018  . Knee pain 03/26/2018  . Encounter for chronic pain management 12/23/2017  . Healthcare maintenance 09/21/2017  . Skin lesion 06/15/2017  . Acute kidney injury superimposed on CKD ([place]) 01/15/2017  . Elevated troponin 01/15/2017  . Coronary stent occlusion: Occluded circumflex stent 01/13/2017  . Accelerated  hypertension with diastolic congestive heart failure, NYHA class 3 ([place]); elevated LVEDP on chronic catheterization. 01/13/2017  . NSTEMI (non-ST elevated myocardial infarction) ([place]) 01/11/2017  . Blood in urine 12/08/2016  . Lymphedema 10/23/2016  . Low back pain with right-sided sciatica 04/03/2014  . Muscle spasm 04/24/2013  . Chronic edema 11/13/2012  . Leg edema, right 09/29/2012  . SK (seborrheic keratosis) 09/29/2012  . Acute on chronic diastolic CHF (congestive heart failure) ([place]) 12/15/2011  . Sebaceous cyst 11/28/2011  . Obesity, Class III, BMI 40-49.9 (morbid obesity) ([place]) 07/27/2011  . Hyperlipidemia with target low density lipoprotein (LDL) cholesterol less than 70 mg/dL 04/26/2011  . Adrenal adenoma 03/02/2011  . Back pain 12/06/2010  . Kidney stones 12/06/2010  . Essential hypertension, benign 10/28/2010  . PULMONARY FIBROSIS 09/15/2010  . CAD (coronary artery disease) 09/01/2010  . Type 2 diabetes mellitus with renal complication ([place]) 03/70/4888  . METABOLIC SYNDROME X 91/69/4503  . OSA (obstructive sleep apnea) 08/31/2010    Palliative Care Assessment & Plan    Assessment/Recommendations/Plan   Continue current scope Family requesting SNF Recommend follow by outpatient Palliative    Code Status: DNR  Prognosis:  < 6 months  Discharge Planning: To Be Determined  Care plan was discussed with patient and care team  Thank you for allowing the Palliative Medicine Team to assist in the care of this patient.  Total time: 40 mins Prolonged billing: No     Greater than 50%  of this time was spent counseling and coordinating care related to the above assessment and plan.  Mariana Kaufman, AGNP-C Palliative Medicine   Please contact Palliative Medicine Team phone at [phone] for questions and concerns.

## 2021-05-13 NOTE — Progress Notes (Signed)
[facility], [place] 05/13/21  Subjective:   LOS: 10 11/10 0701 - 11/11 0700 In: 850 [P.O.:600; I.V.:100] Out: 1300 [Urine:1300] Patient known to our practice from outpatient follow-up.  He is followed by Dr. Cherylann Ratel and was last seen on August 9. He has medical problems of longstanding diabetes, hypertension, history of nephrolithiasis, hyperlipidemia, coronary artery disease status post PTCA, pulmonary fibrosis on 4 L chronic oxygen, obstructive sleep apnea, history of right adrenalectomy, left renal artery stenosis status post stent, pulmonary hypertension  Patient sitting up in chair at the moment. Renal function remains relatively stable with a creatinine of 3.2. Good urine output of 1.3 L.   Objective:  Vital signs in last 24 hours:  Temp:  [97.7 F (36.5 C)-98.6 F (37 C)] 98.6 F (37 C) (11/11 0723) Pulse Rate:  [47-63] 47 (11/11 0723) Resp:  [17-18] 17 (11/11 0517) BP: (121-152)/(51-62) 121/52 (11/11 0723) SpO2:  [97 %-100 %] 99 % (11/11 0830)  Weight change:  Filed Weights   05/08/21 0500 05/10/21 0310 05/11/21 1021  Weight: 108.2 kg 117.5 kg 110.3 kg    Intake/Output:    Intake/Output Summary (Last 24 hours) at 05/13/2021 1103 Last data filed at 05/13/2021 0500 Gross per 24 hour  Intake 460 ml  Output 1300 ml  Net -840 ml      Physical Exam: General: NAD, seated in chair  HEENT Moist oral mucous membranes  Pulm/lungs Clear bilaterally, 4L O2 [place]  CVS/Heart Holosystolic murmur, no rub  Abdomen:  Soft, obese, nontender  Extremities: Trace pitting edema bilaterally  Neurologic: Alert, oriented, follows commands  Skin: Warm, dry  GU Foley catheter-hematuria       Basic Metabolic Panel:  Recent Labs  Lab 05/07/21 0551 05/08/21 0801 05/09/21 0509 05/10/21 0501 05/11/21 0422 05/12/21 0428 05/12/21 1017 05/13/21 0524  NA 137   < > 138 138 137 136  --  137  139  K 4.6   < > 4.5 3.9 3.5 3.2*  --  3.5  3.6  CL 98    < > 100 98 96* 94*  --  94*  96*  CO2 32   < > 31 30 33* 34*  --  34*  35*  GLUCOSE 216*   < > 132* 162* 215* 162*  --  193*  192*  BUN 73*   < > 80* 79* 85* 86*  --  82*  90*  CREATININE 3.44*   < > 3.70* 3.59* 3.64* 3.25*  --  3.19*  3.29*  CALCIUM 8.3*   < > 8.3* 8.0* 8.0* 7.7*  --  8.0*  8.1*  MG 2.2  --   --   --   --   --  2.0 2.3  PHOS 4.3  --   --   --  5.5* 4.8*  --  3.4  3.7   < > = values in this interval not displayed.      CBC: Recent Labs  Lab 05/08/21 0801 05/10/21 0501 05/11/21 0422 05/12/21 0429 05/13/21 0524  WBC 5.4 4.3 3.9* 4.3 4.6  NEUTROABS  --  3.0 2.6 2.7  --   HGB 9.0* 8.4* 8.4* 8.5* 8.5*  HCT 28.9* 27.2* 27.6* 27.7* 27.3*  MCV 90.0 88.0 88.7 89.6 88.9  PLT 254 259 246 266 263       Lab Results  Component Value Date   HEPBSAG NON REACTIVE 05/09/2021      Microbiology:  No results found for this or any previous visit (  from the past 240 hour(s)).   Coagulation Studies: No results for input(s): LABPROT, INR in the last 72 hours.   Urinalysis: No results for input(s): COLORURINE, LABSPEC, PHURINE, GLUCOSEU, HGBUR, BILIRUBINUR, KETONESUR, PROTEINUR, UROBILINOGEN, NITRITE, LEUKOCYTESUR in the last 72 hours.  Invalid input(s): APPERANCEUR     Imaging: No results found.   Medications:    sodium chloride 10 mL/hr at 05/11/21 2107     acetaminophen  650 mg Oral TID   aspirin EC  81 mg Oral Daily   atorvastatin  80 mg Oral QHS   calcitRIOL  0.25 mcg Oral Daily   carvedilol  25 mg Oral BID WC   Chlorhexidine Gluconate Cloth  6 each Topical Daily   cloNIDine  0.1 mg Oral TID   clopidogrel  75 mg Oral Daily   dorzolamide-timolol  1 drop Both Eyes BID   epoetin (EPOGEN/PROCRIT) injection  3,000 Units Subcutaneous Q14 Days   escitalopram  5 mg Oral Daily   ezetimibe  10 mg Oral Daily   feeding supplement  237 mL Oral BID BM   fluticasone furoate-vilanterol  1 puff Inhalation Daily   And   umeclidinium bromide  1 puff  Inhalation Daily   furosemide  80 mg Oral BID   gabapentin  100 mg Oral BID   hydrALAZINE  25 mg Oral TID   insulin aspart  0-20 Units Subcutaneous TID WC   insulin aspart  0-5 Units Subcutaneous QHS   insulin aspart  3 Units Subcutaneous TID WC   insulin detemir  30 Units Subcutaneous QHS   isosorbide mononitrate  120 mg Oral Daily   levothyroxine  50 mcg Oral Q0600   montelukast  10 mg Oral Daily   pantoprazole  40 mg Oral Daily   sodium chloride flush  3 mL Intravenous Q12H   tamsulosin  0.4 mg Oral QHS   cyanocobalamin  1,000 mcg Oral Daily   sodium chloride, albuterol, ALPRAZolam, HYDROcodone-acetaminophen, nitroGLYCERIN, ondansetron (ZOFRAN) IV, polyethylene glycol  Assessment/ Plan:  75 y.o. male with medical problems of longstanding diabetes, hypertension, history of nephrolithiasis, hyperlipidemia, coronary artery disease status post PTCA, pulmonary fibrosis on 4 L chronic oxygen, obstructive sleep apnea, history of right adrenalectomy, left renal artery stenosis status post stent, pulmonary hypertension   Principal Problem:   Moderate major depression, single episode (HCC) Active Problems:   Acute on chronic diastolic CHF (congestive heart failure) (HCC)   Elevated troponin   CHF (congestive heart failure) (HCC)   Unstable angina (HCC)   Pulmonary hypertension (HCC)   Acute on chronic respiratory failure with hypoxia and hypercapnia (HCC)   CKD (chronic kidney disease), stage IV (HCC)   Interstitial lung disease (HCC)   B12 deficiency   Pressure injury of skin   #. CKD st 4 Recent Labs    05/10/21 0501 05/11/21 0422 05/12/21 0428 05/13/21 0524  CREATININE 3.59* 3.64* 3.25* 3.19*  3.29*   Patient has underlying CKD stage IV.  CKD risk factors include diabetes type 2, hypertension, history of IV contrast exposure, atherosclerosis and age Baseline appears to be 2.66/GFR 25 from April 17, 2021 Foley catheter placed for obstruction, hematuria improving Renal  function about the same today.  Creatinine currently 3.2.  Reasonable urine output of 1.3 L now that he is off of Lasix drip.  Continue Lasix 80 mg twice daily.  #. Anemia of CKD  Lab Results  Component Value Date   HGB 8.5 (L) 05/13/2021   Continue to monitor hemoglobin periodically.  Consider Epogen  as an outpatient.  #. SHPTH  No results found for: PTH Lab Results  Component Value Date   PHOS 3.4 05/13/2021   PHOS 3.7 05/13/2021   Outpatient PTH 182 from February 08, 2021 Calcium low at 7.7 Continue calcitriol daily  #. HTN with CKD and lower extremity edema Blood pressure 158/58  Oral furosemide 80 mg twice daily Administer metolazone 5 mg x 1 today.  He will likely need metolazone 2-3 times per week.  #. Diabetes type 2 with CKD Hgb A1c MFr Bld (%)  Date Value  04/21/2021 7.5 (H)  Management as per primary team  #Pulmonary hypertension Right heart catheterization December 06, 2020 Pulmonary artery pressure of 44 mm, right ventricular pressure of 48 mm 2D echo from April 18, 2021.  LVEF 60 to 65%, grade 1 diastolic dysfunction, elevated left atrial pressure, moderate thickening of aortic valve, mild calcification of mitral valve.  Cardiology following    LOS: 10 Darrien Belter  11/11/202211:03 AM  [facility] [place], [place] [phone]

## 2021-05-13 NOTE — Progress Notes (Signed)
PROGRESS NOTE    Stephen [name]  OEH:[id number] DOB: 06/04/1946 DOA: 04/29/2021 PCP: [name], MD   Chief Complain: Shortness of breath  Brief Narrative: Patient is a 75 year old male with history of chronic hypoxic respiratory failure on BiPAP at night, pulmonary hypertension, interstitial lung disease chronically on 4 L of oxygen, chronic diastolic congestive heart failure, coronary disease, diabetes type 2, CKD stage IV who presented here with complaints of shortness of breath/chest pain.  He was recently hospitalized here from 10/16-2010/29 and was managed for NSTEMI , acute respiratory secondary to CHF and COPD exacerbation.  Patient currently being managed for volume overload.  Cardiology, nephrology following here.   S/p Lasix drip, switch to Lasix 80 p.o. twice daily on 11/9.  Assessment & Plan:   Principal Problem:   Moderate major depression, single episode (HCC) Active Problems:   Acute on chronic diastolic CHF (congestive heart failure) (HCC)   Elevated troponin   CHF (congestive heart failure) (HCC)   Unstable angina (HCC)   Pulmonary hypertension (HCC)   Acute on chronic respiratory failure with hypoxia and hypercapnia (HCC)   CKD (chronic kidney disease), stage IV (HCC)   Interstitial lung disease ([place])   B12 deficiency   Pressure injury of skin   Hematuria/urinary retention: Likely secondary to traumatic catheterization.  Has history of BPH.  Urology consulted and recommended outpatient follow-up.  We  recommended to continue Foley for now, also on tamsulosin Continued Lovenox due to hematuria Monitor CBC and transfuse PRBC if hemoglobin drops below 7   Acute on chronic diastolic congestive heart failure: Presented with volume overload, LE edema,elevated BNP. S/p Lasix drip.  Nephrology added metolazone 2-3 times a week 11/9 started Lasix 80 mg p.o. twice daily   Acute on chronic hypoxic/hypercarbic respiratory failure: History of pulm hypertension,  ILD.  On BiPAP chronically with 4 to 6 L of oxygen during sleep.  Continue supplemental oxygen as needed.  Currently on baseline requirement.  Monitor respiratory status. Chest x-ray done on 05/09/2019 showed perihilar opacities, s/p ceftriaxone to cover for PNA x 5 days course, completed on 11/11  NSTEMI: Presented with chest pain.  Recently admitted here and was managed for the NSTEMI.  Elevated troponin presentation.  Initially started on heparin drip.  Currently on aspirin, Plavix, Imdur, Coreg, Lipitor.  Cardiology following.  Cardiology does not think left heart cath or possible PCI will  improve patient's symptoms.  AKI on CKD stage IV: Creatinine in the range of 3.  S/p Lasix drip.  Nephrology following here.  AKI is thought to be secondary to ATN. There was discussion about potentially starting him on dialysis, currently the plan is on hold Nephro started Lasix 80 mg p.o. twice daily  Hypertension: Currently mildly hypertensive.  Monitor blood pressure   Anemia of chronic disease/iron deficiency: Iron studies shows iron of 17.  given a dose of IV iron.  Ferritin within normal range, folate and B12 within normal range started on Epogen  Diabetes type 2: Currently on Levemir, sliding scale insulin.  Monitor blood sugars.  OSA: Continue CPAP   Morbid obesity: BMI of 39.6  Constipation: Continue bowel regimen  Generalized weakness/deconditioning: As per wife patient has acute decline and has been bedbound since last few weeks.  We requested for PT/OT evaluation,recommended HH  Depression, as per cardiology we may start antidepressant Consulted psychiatry for their expertise to start on antidepressant if needed TSH 1.3 wnl, it was done 2 weeks ago    DVT prophylaxis:Lovenox Code Status:  DNR Family Communication: Wife at bedside Status is: Inpatient  Remains inpatient appropriate because: Debility and multiple comorbidities, on IV antibiotics      Consultants: Nephrology,  cardiology, psychiatry  Procedures: None  Antimicrobials:  Anti-infectives (From admission, onward)    Start     Dose/Rate Route Frequency Ordered Stop   05/08/21 2000  cefTRIAXone (ROCEPHIN) 1 g in sodium chloride 0.9 % 100 mL IVPB        1 g 200 mL/hr over 30 Minutes Intravenous Every 24 hours 05/08/21 1817 05/12/21 2315   04/19/2021 1000  doxycycline (VIBRA-TABS) tablet 100 mg        100 mg Oral Every 12 hours 04/23/2021 0725 05/05/21 2123       Subjective:  No significant overnight events, patient's breathing improved, currently patient is on 4 L oxygen which is his baseline.  Edema is improving.  Patient denied any active issues. Foley catheter draining bloody urine     Objective: Vitals:   05/13/21 0723 05/13/21 0830 05/13/21 1113 05/13/21 1402  BP: (!) 121/52  (!) 137/51 (!) 141/52  Pulse: (!) 47  (!) 51 (!) 56  Resp:      Temp: 98.6 F (37 C)  97.8 F (36.6 C) 97.9 F (36.6 C)  TempSrc: Oral     SpO2: 99% 99% 100% 100%  Weight:      Height:        Intake/Output Summary (Last 24 hours) at 05/13/2021 1620 Last data filed at 05/13/2021 1614 Gross per 24 hour  Intake 580 ml  Output 2050 ml  Net -1470 ml   Filed Weights   05/08/21 0500 05/10/21 0310 05/11/21 1021  Weight: 108.2 kg 117.5 kg 110.3 kg    Examination:  General exam: Chronically ill looking, lying in bed, obese, weak HEENT: PERRL Respiratory system: Diminished air sounds bilaterally, no wheezes or crackles  Cardiovascular system: S1 & S2 heard, RRR.  Gastrointestinal system: Abdomen is nondistended, soft and nontender. Central nervous system: Alert and oriented Extremities: Bilateral lower extremity edema, no clubbing ,no cyanosis Skin: No rashes, no ulcers,no icterus   GU: Foley draining bloody urine  Data Reviewed: I have personally reviewed following labs and imaging studies  CBC: Recent Labs  Lab 05/08/21 0801 05/10/21 0501 05/11/21 0422 05/12/21 0429 05/13/21 0524  WBC 5.4 4.3  3.9* 4.3 4.6  NEUTROABS  --  3.0 2.6 2.7  --   HGB 9.0* 8.4* 8.4* 8.5* 8.5*  HCT 28.9* 27.2* 27.6* 27.7* 27.3*  MCV 90.0 88.0 88.7 89.6 88.9  PLT 254 259 246 266 967   Basic Metabolic Panel: Recent Labs  Lab 05/07/21 0551 05/08/21 0801 05/09/21 0509 05/10/21 0501 05/11/21 0422 05/12/21 0428 05/12/21 1017 05/13/21 0524  NA 137   < > 138 138 137 136  --  137  139  K 4.6   < > 4.5 3.9 3.5 3.2*  --  3.5  3.6  CL 98   < > 100 98 96* 94*  --  94*  96*  CO2 32   < > 31 30 33* 34*  --  34*  35*  GLUCOSE 216*   < > 132* 162* 215* 162*  --  193*  192*  BUN 73*   < > 80* 79* 85* 86*  --  82*  90*  CREATININE 3.44*   < > 3.70* 3.59* 3.64* 3.25*  --  3.19*  3.29*  CALCIUM 8.3*   < > 8.3* 8.0* 8.0* 7.7*  --  8.0*  8.1*  MG 2.2  --   --   --   --   --  2.0 2.3  PHOS 4.3  --   --   --  5.5* 4.8*  --  3.4  3.7   < > = values in this interval not displayed.   GFR: Estimated Creatinine Clearance: 23.6 mL/min (A) (by C-G formula based on SCr of 3.19 mg/dL (H)). Liver Function Tests: Recent Labs  Lab 05/11/21 0422 05/12/21 0428 05/13/21 0524  ALBUMIN 1.9* 1.9* 1.9*   No results for input(s): LIPASE, AMYLASE in the last 168 hours. No results for input(s): AMMONIA in the last 168 hours. Coagulation Profile: No results for input(s): INR, PROTIME in the last 168 hours. Cardiac Enzymes: No results for input(s): CKTOTAL, CKMB, CKMBINDEX, TROPONINI in the last 168 hours. BNP (last 3 results) No results for input(s): PROBNP in the last 8760 hours. HbA1C: No results for input(s): HGBA1C in the last 72 hours. CBG: Recent Labs  Lab 05/12/21 1134 05/12/21 1646 05/12/21 2157 05/13/21 0723 05/13/21 1115  GLUCAP 239* 268* 267* 168* 160*   Lipid Profile: No results for input(s): CHOL, HDL, LDLCALC, TRIG, CHOLHDL, LDLDIRECT in the last 72 hours. Thyroid Function Tests: No results for input(s): TSH, T4TOTAL, FREET4, T3FREE, THYROIDAB in the last 72 hours. Anemia Panel: No results  for input(s): VITAMINB12, FOLATE, FERRITIN, TIBC, IRON, RETICCTPCT in the last 72 hours.  Sepsis Labs: No results for input(s): PROCALCITON, LATICACIDVEN in the last 168 hours.  No results found for this or any previous visit (from the past 240 hour(s)).        Radiology Studies: No results found.      Scheduled Meds:  acetaminophen  650 mg Oral TID   aspirin EC  81 mg Oral Daily   atorvastatin  80 mg Oral QHS   calcitRIOL  0.25 mcg Oral Daily   carvedilol  25 mg Oral BID WC   Chlorhexidine Gluconate Cloth  6 each Topical Daily   cloNIDine  0.1 mg Oral TID   clopidogrel  75 mg Oral Daily   dorzolamide-timolol  1 drop Both Eyes BID   epoetin (EPOGEN/PROCRIT) injection  3,000 Units Subcutaneous Q14 Days   escitalopram  5 mg Oral Daily   ezetimibe  10 mg Oral Daily   feeding supplement  237 mL Oral BID BM   fluticasone furoate-vilanterol  1 puff Inhalation Daily   And   umeclidinium bromide  1 puff Inhalation Daily   furosemide  80 mg Oral BID   gabapentin  100 mg Oral BID   hydrALAZINE  25 mg Oral TID   insulin aspart  0-20 Units Subcutaneous TID WC   insulin aspart  0-5 Units Subcutaneous QHS   insulin aspart  3 Units Subcutaneous TID WC   insulin detemir  30 Units Subcutaneous QHS   isosorbide mononitrate  120 mg Oral Daily   levothyroxine  50 mcg Oral Q0600   montelukast  10 mg Oral Daily   pantoprazole  40 mg Oral Daily   sodium chloride flush  3 mL Intravenous Q12H   tamsulosin  0.4 mg Oral QHS   cyanocobalamin  1,000 mcg Oral Daily   Continuous Infusions:  sodium chloride 10 mL/hr at 05/11/21 2107     LOS: 10 days    Time spent: 25 mins.More than 50% of that time was spent in counseling and/or coordination of care.      Val Riles, MD [facility] P11/05/2021, 4:20 PM

## 2021-05-13 NOTE — Progress Notes (Signed)
Progress Note  Patient Name: Stephen [name] Date of Encounter: 05/13/2021  [facility] Cardiologist: Ida Rogue, MD   Subjective   Wife and son at the bedside this morning, Discussed his getting out of bed yesterday, got tired and had to go back to bed Wife reports his sacrum at the site of sacral decub ulcer was hurting him so he had to lay down " They are looking for another bed" He is minimally conversant but alert, minimal discussion from son, wife did most of the talking On discussion of how he feels today, " so-so" No particular complaints On discussion of bedpan, recommended he get out of bed to use the bedside commode.  Noncommittal one way or the other. "  I can wait" No chest pain, no shortness of breath at rest supine in the bed on low-rate nasal cannula oxygen  Inpatient Medications    Scheduled Meds:  aspirin EC  81 mg Oral Daily   atorvastatin  80 mg Oral QHS   calcitRIOL  0.25 mcg Oral Daily   carvedilol  25 mg Oral BID WC   Chlorhexidine Gluconate Cloth  6 each Topical Daily   cloNIDine  0.1 mg Oral TID   clopidogrel  75 mg Oral Daily   dorzolamide-timolol  1 drop Both Eyes BID   epoetin (EPOGEN/PROCRIT) injection  3,000 Units Subcutaneous Q14 Days   escitalopram  5 mg Oral Daily   ezetimibe  10 mg Oral Daily   feeding supplement  237 mL Oral BID BM   fluticasone furoate-vilanterol  1 puff Inhalation Daily   And   umeclidinium bromide  1 puff Inhalation Daily   furosemide  80 mg Oral BID   gabapentin  100 mg Oral BID   hydrALAZINE  25 mg Oral TID   insulin aspart  0-20 Units Subcutaneous TID WC   insulin aspart  0-5 Units Subcutaneous QHS   insulin detemir  30 Units Subcutaneous QHS   isosorbide mononitrate  120 mg Oral Daily   levothyroxine  50 mcg Oral Q0600   metolazone  5 mg Oral Once   montelukast  10 mg Oral Daily   pantoprazole  40 mg Oral Daily   sodium chloride flush  3 mL Intravenous Q12H   tamsulosin  0.4 mg Oral QHS    cyanocobalamin  1,000 mcg Oral Daily   Continuous Infusions:  sodium chloride 10 mL/hr at 05/11/21 2107   PRN Meds: sodium chloride, acetaminophen, albuterol, ALPRAZolam, HYDROcodone-acetaminophen, nitroGLYCERIN, ondansetron (ZOFRAN) IV, polyethylene glycol   Vital Signs    Vitals:   05/12/21 1951 05/12/21 2355 05/13/21 0517 05/13/21 0723  BP: (!) 152/56 (!) 144/62 135/60 (!) 121/52  Pulse: 63  (!) 52 (!) 47  Resp: 18 18 17    Temp: 97.7 F (36.5 C) 97.8 F (36.6 C) 98.1 F (36.7 C) 98.6 F (37 C)  TempSrc: Axillary Oral  Oral  SpO2: 97% 98% 100% 99%  Weight:      Height:        Intake/Output Summary (Last 24 hours) at 05/13/2021 0843 Last data filed at 05/13/2021 0500 Gross per 24 hour  Intake 850 ml  Output 1300 ml  Net -450 ml   Last 3 Weights 05/11/2021 05/10/2021 05/08/2021  Weight (lbs) 243 lb 1.6 oz 259 lb 0.7 oz 238 lb 8.6 oz  Weight (kg) 110.269 kg 117.5 kg 108.2 kg  Some encounter information is confidential and restricted. Go to Review Flowsheets activity to see all data.  Telemetry    Sinus rhythm rate 57 bpm- Personally Reviewed  ECG    Normal sinus rhythm- Personally Reviewed  Physical Exam   Constitutional:  oriented to person, place, and time. No distress.  HENT:  Head: Grossly normal Eyes:  no discharge. No scleral icterus.  Neck: Unable to estimate JVD, no carotid bruits  Cardiovascular: Regular rate and rhythm, no murmurs appreciated Pulmonary/Chest: Mildly decreased breath sounds throughout, poor inspiratory effort, scattered Rales Abdominal: Soft.  no distension.  no tenderness.  Musculoskeletal: Normal range of motion Neurological:  normal muscle tone. Coordination normal. No atrophy Skin: Skin warm and dry Psychiatric: Flat affect   Labs    High Sensitivity Troponin:   Recent Labs  Lab 04/18/21 0505 04/18/21 0658 04/18/21 1240 04/06/2021 0213 04/28/2021 0344  TROPONINIHS 2,129* 1,997* 1,842* 1,417* 1,514*      Chemistry Recent Labs  Lab 05/07/21 0551 05/08/21 0801 05/11/21 0422 05/12/21 0428 05/12/21 1017 05/13/21 0524  NA 137   < > 137 136  --  137  139  K 4.6   < > 3.5 3.2*  --  3.5  3.6  CL 98   < > 96* 94*  --  94*  96*  CO2 32   < > 33* 34*  --  34*  35*  GLUCOSE 216*   < > 215* 162*  --  193*  192*  BUN 73*   < > 85* 86*  --  82*  90*  CREATININE 3.44*   < > 3.64* 3.25*  --  3.19*  3.29*  CALCIUM 8.3*   < > 8.0* 7.7*  --  8.0*  8.1*  MG 2.2  --   --   --  2.0 2.3  ALBUMIN  --   --  1.9* 1.9*  --  1.9*  GFRNONAA 18*   < > 17* 19*  --  20*  19*  ANIONGAP 7   < > 8 8  --  9  8   < > = values in this interval not displayed.    Lipids No results for input(s): CHOL, TRIG, HDL, LABVLDL, LDLCALC, CHOLHDL in the last 168 hours.  Hematology Recent Labs  Lab 05/11/21 0422 05/12/21 0429 05/13/21 0524  WBC 3.9* 4.3 4.6  RBC 3.11* 3.09* 3.07*  HGB 8.4* 8.5* 8.5*  HCT 27.6* 27.7* 27.3*  MCV 88.7 89.6 88.9  MCH 27.0 27.5 27.7  MCHC 30.4 30.7 31.1  RDW 15.6* 15.5 15.6*  PLT 246 266 263   Thyroid No results for input(s): TSH, FREET4 in the last 168 hours.  BNPNo results for input(s): BNP, PROBNP in the last 168 hours.  DDimer No results for input(s): DDIMER in the last 168 hours.   Radiology    No results found.  Cardiac Studies    Patient Profile     75 y.o. male with a hx of  pulmonary hypertension, chronic diastolic CHF, CAD, pulmonary fibrosis s/p wedge resection, obesity, COPD on home oxygen, OSA on CPAP, DM2, CKD, renal artery stenosis, and who is being seen today for the evaluation of chest pain and elevated HS Tn.   Assessment & Plan    1.  Acute on chronic HFpEF/pulmonary arterial hypertension:  Treated aggressively this admission with IV Lasix, on and off depending on his encephalopathy and renal dysfunction -Continue either torsemide 40 twice daily or oral Lasix 80 twice daily (per nephrology).  His intake is not extensive, appears relatively  euvolemic ----Main issue at this point is generalized  weakness, and need for rehab   2.  CAD/non-STEMI:  Known severe disease Admitted with chest pain and dyspnea.  Has a history of chronic chest pain  Peak high-sensitivity troponin of 2129.   Cath in February with moderate LAD and RCA disease and occluded left circumflex with right to left and left to left collaterals.  -Medical management at this time, not a good cardiac catheterization candidate given renal dysfunction, given nitro for chest pain if needed   3.  Essential hypertension:  Long history for poorly controlled hypertension likely contributing to underlying renal dysfunction Pressures 120-1 50 Continue current doses of clonidine,  isosorbide, Coreg, hydralazine Stable  4.  Hyperlipidemia:  LDL of 69 in June 2022.  On statin.  5.  Stage IV chronic kidney disease:  Long history of poorly controlled hypertension Not a good candidate for dialysis but will defer to nephrology   6.  Interstitial lung disease/pulmonary fibrosis:  Requires BiPAP when napping and in the evening, important he has this at any nursing facility or at home Develops encephalopathy when he does not wear his BiPAP  7.  Obstructive sleep apnea:  Uses CPAP/BiPAP at night. As above would stressed the importance of staying on BiPAP when sleeping and napping  8.  Hematuria Etiology unclear, urology following, case discussed, they are flushing  Dispo Followed by palliative, he is noncommittal, wife continues to hope he will recover Patient is not showing significant motivation to participate in PT, Now with sacral decub ulcer Started on Lexapro for depression If patient and wife do not want palliative, would proceed with placement at rehab High risk of readmission given chronic shortness of breath, chronic episodes of chest pain.  Both of these issues are chronic, wax and wane, likely progressive .  Chest pain would treat with sublingual nitro.   Shortness of breath episodes treated with extra metolazone and Lasix  As another mornings, long discussion with wife, patient, son at the bedside  Total encounter time more than 35 minutes  Greater than 50% was spent in counseling and coordination of care with the patient   For questions or updates, please contact [place] Please consult [email or web address] for contact info under        Signed, Ida Rogue, MD  05/13/2021, 8:43 AM

## 2021-05-13 NOTE — Progress Notes (Deleted)
Patient ID: Stephen [name], male    DOB: 12-20-1945, 75 y.o.   MRN: [id number]  HPI  Stephen [name] is a 75 y/o male with a history of arthritis, CAD, DM, hyperlipidemia, GERD, HTN, chronic kidney disease, obstructive sleep apnea (CPAP), pulmonary fibrosis (chronic oxygen) and chronic heart failure.   Echo report from 04/18/21 reviewed and showed an EF of 60-65% along with mild LVH and trivial Stephen. Echo done 01/11/17 shows an EF of 50-55%.   RHC done 12/06/20 and showed: Right heart catheterization showed mildly to moderately elevated filling pressures, mild pulmonary hypertension and high cardiac output. RA: 13 mmHg PCW: 20 mmHg with a V wave of 27 mmHg PA: 44/ 18 with a mean of 30 mmHg.  Pulmonary vascular resistance is only 1.14 [place] unit RV: 48 /12 mmHg PA sat is 97% with cardiac output of 8.79 and cardiac index of 3.98.  Cardiac catheterization done 01/11/17 shows significant 2 vessel CAD with occluded stent in the mid left circumflex with collaterals noted from the LAD and RCA. Patent RCA stent with 70% stenosis. Due to collaterals, optimizing medical therapy was recommended.  Admitted 05/01/2021 due to    He presents today for his follow-up visit although hasn't been seen since 2018.  Past Medical History:  Diagnosis Date   Adrenal gland anomaly    Arthritis    CAD (coronary artery disease)    a. 04/2006 MI and PCI/stenting to mLCx & mRCA; b. 07/2009 Cath: patent LCX/RCA stents;  c. 12/2016 NSTEMI/Cath: LM 30, LAD 30p, 44m, 30d, D1/2/3 min irregs, LCX 158m ISR, OM1 min irregs, RCA 20p, 10 ISR, 36m, 50d, RPDA min irregs, RPAV 40-->Med Rx; d. 12/2017 MV: fixed lateral wall scar, mild anterior/anterior septal ischemia. EF 30-44%.   CHF (congestive heart failure) (HCC)    CKD (chronic kidney disease), stage III (HCC)    Colon polyps    DM2 (diabetes mellitus, type 2) (HCC)    insulin requiring   Dyslipidemia    GERD (gastroesophageal reflux disease)    History of echocardiogram    a. TTE  12/2016: EF 50-55%, mod concentric LVH, images inadequate for wall motion assessment, not technically sufficienct to allow for LV dias fxn, calcified mitral annulus    HTN (hypertension)    Kidney stones    Left Renal artery stenosis ([place])    a. 07/2013 s/p PTA/stenting ([place]); b. 07/2017 Renal Duplex: No significant RAS.   Myocardial infarction (HCC)    OSA (obstructive sleep apnea)    Pulmonary fibrosis ([place])    a. 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   Recurrent UTI    Rotator cuff disorder    right   Skin cancer    head   Thyroid disorder    Venous insufficiency of both lower extremities    a. s/p laser treatment.   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   CARPAL TUNNEL RELEASE     bilateral   CATARACT EXTRACTION W/PHACO Right 07/08/2018   Procedure: CATARACT EXTRACTION PHACO AND INTRAOCULAR LENS PLACEMENT ([place]) RIGHT DIABETIC;  Surgeon: Eulogio Bear, MD;  Location: [facility];  Service: Ophthalmology;  Laterality: Right;  Diabetic - insulin sleep apnea   CATARACT EXTRACTION W/PHACO Left 08/05/2018   Procedure: CATARACT EXTRACTION PHACO AND INTRAOCULAR LENS PLACEMENT ([place]) LEFT DIABETIC;  Surgeon: Eulogio Bear, MD;  Location: [place];  Service: Ophthalmology;  Laterality: Left;  DIABETIC   CORONARY ANGIOGRAPHY N/A 08/19/2020   Procedure: CORONARY ANGIOGRAPHY;  Surgeon: Minna Merritts, MD;  Location: [facility];  Service: Cardiovascular;  Laterality: N/A;   CORONARY ANGIOPLASTY WITH STENT PLACEMENT  2007   x 2   KNEE SURGERY     right   LEFT HEART CATH AND CORONARY ANGIOGRAPHY N/A 01/11/2017   Procedure: Left Heart Cath and Coronary Angiography;  Surgeon: Wellington Hampshire, MD;  Location: [facility];  Service: Cardiovascular;  Laterality: N/A;   LUNG BIOPSY     RENAL ARTERY STENT  2015   L   RIGHT HEART CATH Right 12/06/2020    Procedure: RIGHT HEART CATH;  Surgeon: Wellington Hampshire, MD;  Location: [facility];  Service: Cardiovascular;  Laterality: Right;   SKIN CANCER EXCISION     back of head   Family History  Problem Relation Age of Onset   Heart attack Father    Heart attack Mother    Heart attack Brother    COPD Sister    Arthritis Maternal Grandmother    Breast cancer Maternal Aunt    Diabetes Maternal Aunt    Hypertension Other    Hypertension Maternal Aunt    Prostate cancer Neg Hx    Kidney cancer Neg Hx    Bladder Cancer Neg Hx    Colon cancer Neg Hx    Social History   Tobacco Use   Smoking status: Never   Smokeless tobacco: Never  Substance Use Topics   Alcohol use: No    Alcohol/week: 0.0 standard drinks   Allergies  Allergen Reactions   Calcium Channel Blockers     Would avoid if possible due to h/o peripheral edema     Review of Systems  Constitutional:  Positive for fatigue. Negative for appetite change.  HENT:  Negative for congestion, rhinorrhea and sore throat.   Eyes: Negative.   Respiratory:  Positive for shortness of breath (improving). Negative for chest tightness and wheezing.   Cardiovascular:  Positive for leg swelling. Negative for chest pain and palpitations.  Gastrointestinal:  Negative for abdominal distention and abdominal pain.  Endocrine: Negative.   Genitourinary:  Positive for dysuria. Negative for hematuria.  Musculoskeletal:  Negative for back pain and neck pain.  Skin: Negative.   Allergic/Immunologic: Negative.   Neurological:  Positive for light-headedness. Negative for dizziness.  Hematological:  Negative for adenopathy. Does not bruise/bleed easily.  Psychiatric/Behavioral:  Negative for dysphoric mood and sleep disturbance (sleeping on 1 pillow; wearing oxygent at 2L around the clock along with CPAP). The patient is not nervous/anxious.      Physical Exam Vitals and nursing note reviewed.  Constitutional:      Appearance: He is  well-developed.  HENT:     Head: Normocephalic and atraumatic.  Neck:     Vascular: No JVD.  Cardiovascular:     Rate and Rhythm: Regular rhythm. Bradycardia present.  Pulmonary:     Effort: Pulmonary effort is normal.     Breath sounds: No wheezing or rales.  Abdominal:     General: There is no distension.     Palpations: Abdomen is soft.     Tenderness: There is no abdominal tenderness.  Musculoskeletal:        General: No tenderness.     Cervical back: Normal range of motion and neck supple.  Skin:    General: Skin is warm and dry.  Neurological:     Mental Status: He is  alert and oriented to person, place, and time.  Psychiatric:        Behavior: Behavior normal.        Thought Content: Thought content normal.   Assessment and Plan:  1: Chronic heart failure with preserved ejection fraction- - NYHA class II - euvolemic today - weighing daily and he was instructed to call for an overnight weight gain of >2 pounds or a weekly weight gain of >5 pounds - not adding salt and wife has been reading food labels. Reviewed the importance of closely following a 2000mg  sodium diet - wearing support socks and elevating legs on occasion which helps a little bit in reducing edema - wears oxygen at 2L around the clock - saw cardiology ([place]) 02/01/17 and returns 03/12/17  2: HTN- - BP looks good today - BMP from 02/01/17 shows sodium 138, potassium 3.9 and GFR 44 - saw PCP [name]) 01/22/17  3: Diabetes- - glucose at home this morning was 125 - A1c on 01/11/17 was 7.0%  4: Obstructive sleep apnea- - wears CPAP on a nightly basis   Patient did not bring his medications nor a list. Each medication was verbally reviewed with the patient and he was encouraged to bring the bottles to every visit to confirm accuracy of list.  Return in 6 months or sooner for any questions/problems before then.

## 2021-05-13 NOTE — Progress Notes (Signed)
Urology Inpatient Progress Note  Subjective: No acute events overnight.  He is afebrile, VSS. Hemoglobin stable today, 8.5.  Creatinine stable, 3.29. Foley catheter in place draining maroon urine. Patient reports occasional sensations of urinary urgency.  He needs to have a bowel movement as well.  He denies pain otherwise.  Anti-infectives: Anti-infectives (From admission, onward)    Start     Dose/Rate Route Frequency Ordered Stop   05/08/21 2000  cefTRIAXone (ROCEPHIN) 1 g in sodium chloride 0.9 % 100 mL IVPB        1 g 200 mL/hr over 30 Minutes Intravenous Every 24 hours 05/08/21 1817 05/12/21 2315   04/17/2021 1000  doxycycline (VIBRA-TABS) tablet 100 mg        100 mg Oral Every 12 hours 05/01/2021 0725 05/05/21 2123       Current Facility-Administered Medications  Medication Dose Route Frequency Provider Last Rate Last Admin   0.9 %  sodium chloride infusion   Intravenous PRN Sharion Settler, NP 10 mL/hr at 05/11/21 2107 New Bag at 05/11/21 2107   acetaminophen (TYLENOL) tablet 650 mg  650 mg Oral Q4H PRN Oswald Hillock, RPH   650 mg at 05/12/21 1041   albuterol (PROVENTIL) (2.5 MG/3ML) 0.083% nebulizer solution 2.5 mg  2.5 mg Inhalation Q6H PRN Loletha Grayer, MD   2.5 mg at 05/08/21 2158   ALPRAZolam Duanne Moron) tablet 0.5 mg  0.5 mg Oral TID PRN Shelly Coss, MD   0.5 mg at 05/09/21 2226   aspirin EC tablet 81 mg  81 mg Oral Daily Judd Gaudier V, MD   81 mg at 05/12/21 0910   atorvastatin (LIPITOR) tablet 80 mg  80 mg Oral QHS Athena Masse, MD   80 mg at 05/12/21 2236   calcitRIOL (ROCALTROL) capsule 0.25 mcg  0.25 mcg Oral Daily Loletha Grayer, MD   0.25 mcg at 05/12/21 0912   carvedilol (COREG) tablet 25 mg  25 mg Oral BID WC Athena Masse, MD   25 mg at 05/13/21 0848   Chlorhexidine Gluconate Cloth 2 % PADS 6 each  6 each Topical Daily Cristal Deer, MD   6 each at 05/12/21 0913   cloNIDine (CATAPRES) tablet 0.1 mg  0.1 mg Oral TID End, Christopher, MD   0.1 mg  at 05/12/21 2237   clopidogrel (PLAVIX) tablet 75 mg  75 mg Oral Daily Loletha Grayer, MD   75 mg at 05/12/21 0912   dorzolamide-timolol (COSOPT) 22.3-6.8 MG/ML ophthalmic solution 1 drop  1 drop Both Eyes BID Loletha Grayer, MD   1 drop at 05/12/21 2239   epoetin alfa (EPOGEN) injection 3,000 Units  3,000 Units Subcutaneous Q14 Days Liana Gerold, MD   3,000 Units at 05/09/21 0609   escitalopram (LEXAPRO) tablet 5 mg  5 mg Oral Daily Clapacs, Madie Reno, MD       ezetimibe (ZETIA) tablet 10 mg  10 mg Oral Daily Wieting, Richard, MD   10 mg at 05/12/21 0912   feeding supplement (ENSURE ENLIVE / ENSURE PLUS) liquid 237 mL  237 mL Oral BID BM Breeze, Shantelle, NP   237 mL at 05/12/21 1727   fluticasone furoate-vilanterol (BREO ELLIPTA) 200-25 MCG/ACT 1 puff  1 puff Inhalation Daily Loletha Grayer, MD   1 puff at 05/13/21 0848   And   umeclidinium bromide (INCRUSE ELLIPTA) 62.5 MCG/ACT 1 puff  1 puff Inhalation Daily Loletha Grayer, MD   1 puff at 05/13/21 0848   furosemide (LASIX) tablet 80 mg  80  mg Oral BID Colon Flattery, NP   80 mg at 05/13/21 0848   gabapentin (NEURONTIN) capsule 100 mg  100 mg Oral BID Loletha Grayer, MD   100 mg at 05/12/21 2237   hydrALAZINE (APRESOLINE) tablet 25 mg  25 mg Oral TID Minna Merritts, MD   25 mg at 05/12/21 2236   HYDROcodone-acetaminophen (NORCO/VICODIN) 5-325 MG per tablet 1 tablet  1 tablet Oral Q6H PRN Shelly Coss, MD   1 tablet at 05/13/21 0847   insulin aspart (novoLOG) injection 0-20 Units  0-20 Units Subcutaneous TID WC Athena Masse, MD   4 Units at 05/13/21 0846   insulin aspart (novoLOG) injection 0-5 Units  0-5 Units Subcutaneous QHS Athena Masse, MD   3 Units at 05/12/21 2238   insulin detemir (LEVEMIR) injection 30 Units  30 Units Subcutaneous QHS Loletha Grayer, MD   30 Units at 05/12/21 2237   isosorbide mononitrate (IMDUR) 24 hr tablet 120 mg  120 mg Oral Daily Judd Gaudier V, MD   120 mg at 05/12/21 0911    levothyroxine (SYNTHROID) tablet 50 mcg  50 mcg Oral Q0600 Athena Masse, MD   50 mcg at 05/13/21 4315   metolazone (ZAROXOLYN) tablet 5 mg  5 mg Oral Once Colon Flattery, NP       montelukast (SINGULAIR) tablet 10 mg  10 mg Oral Daily Loletha Grayer, MD   10 mg at 05/12/21 0911   nitroGLYCERIN (NITROSTAT) SL tablet 0.4 mg  0.4 mg Sublingual Q5 min PRN Loletha Grayer, MD       ondansetron [facility]) injection 4 mg  4 mg Intravenous Q6H PRN Athena Masse, MD   4 mg at 05/04/21 1731   pantoprazole (PROTONIX) EC tablet 40 mg  40 mg Oral Daily Loletha Grayer, MD   40 mg at 05/12/21 0911   polyethylene glycol (MIRALAX / GLYCOLAX) packet 17 g  17 g Oral Daily PRN Loletha Grayer, MD   17 g at 05/06/21 2101   sodium chloride flush (NS) 0.9 % injection 3 mL  3 mL Intravenous Q12H Shawna Clamp, MD   3 mL at 05/12/21 2245   tamsulosin (FLOMAX) capsule 0.4 mg  0.4 mg Oral QHS Loletha Grayer, MD   0.4 mg at 05/12/21 2236   vitamin B-12 (CYANOCOBALAMIN) tablet 1,000 mcg  1,000 mcg Oral Daily Loletha Grayer, MD   1,000 mcg at 05/12/21 0911   Objective: Vital signs in last 24 hours: Temp:  [97.7 F (36.5 C)-98.6 F (37 C)] 98.6 F (37 C) (11/11 0723) Pulse Rate:  [47-63] 47 (11/11 0723) Resp:  [17-18] 17 (11/11 0517) BP: (121-152)/(51-62) 121/52 (11/11 0723) SpO2:  [97 %-100 %] 99 % (11/11 0723)  Intake/Output from previous day: 11/10 0701 - 11/11 0700 In: 850 [P.O.:600; I.V.:100] Out: 1300 [Urine:1300] Intake/Output this shift: No intake/output data recorded.  Physical Exam Vitals and nursing note reviewed.  Constitutional:      General: He is not in acute distress.    Appearance: He is not ill-appearing, toxic-appearing or diaphoretic.  HENT:     Head: Normocephalic and atraumatic.  Pulmonary:     Effort: Pulmonary effort is normal. No respiratory distress.  Skin:    General: Skin is warm and dry.  Neurological:     Mental Status: He is alert and oriented to person,  place, and time.  Psychiatric:        Mood and Affect: Mood normal.        Behavior: Behavior normal.  Lab Results:  Recent Labs    05/12/21 0429 05/13/21 0524  WBC 4.3 4.6  HGB 8.5* 8.5*  HCT 27.7* 27.3*  PLT 266 263   BMET Recent Labs    05/12/21 0428 05/13/21 0524  NA 136 137  139  K 3.2* 3.5  3.6  CL 94* 94*  96*  CO2 34* 34*  35*  GLUCOSE 162* 193*  192*  BUN 86* 82*  90*  CREATININE 3.25* 3.19*  3.29*  CALCIUM 7.7* 8.0*  8.1*   Assessment & Plan: 75 year old comorbid male admitted with NSTEMI and urinary retention with gross hematuria now s/p Foley catheter placement.  Gross hematuria has persisted, though his blood counts are stable.  Using clean technique, I irrigated the patient's catheter with 1 L of sterile water at the bedside today with return of approximately 20 cc of clot material.  Efflux cleared from maroon to pink with this.  At the conclusion of the procedure, catheter was irrigating well with no residual clots noted in his efflux.  Patient tolerated well.  Recommendations: -Manually irrigate catheter as needed if it stops draining or if urine darkens to maroon to clear any residual clot burden -Continue to trend H&H -Continue Flomax -Continue Foley catheter with plans for outpatient voiding trial in 1 week -Outpatient hematuria work-up  Debroah Loop, PA-C 05/13/2021

## 2021-05-14 DIAGNOSIS — R0789 Other chest pain: Secondary | ICD-10-CM

## 2021-05-14 DIAGNOSIS — R0602 Shortness of breath: Secondary | ICD-10-CM

## 2021-05-14 LAB — MAGNESIUM: Magnesium: 2.1 mg/dL (ref 1.7–2.4)

## 2021-05-14 LAB — CBC
HCT: 25.8 % — ABNORMAL LOW (ref 39.0–52.0)
Hemoglobin: 7.9 g/dL — ABNORMAL LOW (ref 13.0–17.0)
MCH: 27.2 pg (ref 26.0–34.0)
MCHC: 30.6 g/dL (ref 30.0–36.0)
MCV: 89 fL (ref 80.0–100.0)
Platelets: 274 10*3/uL (ref 150–400)
RBC: 2.9 MIL/uL — ABNORMAL LOW (ref 4.22–5.81)
RDW: 15.6 % — ABNORMAL HIGH (ref 11.5–15.5)
WBC: 4.1 10*3/uL (ref 4.0–10.5)
nRBC: 0 % (ref 0.0–0.2)

## 2021-05-14 LAB — GLUCOSE, CAPILLARY
Glucose-Capillary: 185 mg/dL — ABNORMAL HIGH (ref 70–99)
Glucose-Capillary: 206 mg/dL — ABNORMAL HIGH (ref 70–99)
Glucose-Capillary: 203 mg/dL — ABNORMAL HIGH (ref 70–99)
Glucose-Capillary: 196 mg/dL — ABNORMAL HIGH (ref 70–99)

## 2021-05-14 LAB — RENAL FUNCTION PANEL
Albumin: 2 g/dL — ABNORMAL LOW (ref 3.5–5.0)
Anion gap: 10 (ref 5–15)
BUN: 93 mg/dL — ABNORMAL HIGH (ref 8–23)
CO2: 37 mmol/L — ABNORMAL HIGH (ref 22–32)
Calcium: 8.4 mg/dL — ABNORMAL LOW (ref 8.9–10.3)
Chloride: 93 mmol/L — ABNORMAL LOW (ref 98–111)
Creatinine, Ser: 3.2 mg/dL — ABNORMAL HIGH (ref 0.61–1.24)
GFR, Estimated: 20 mL/min — ABNORMAL LOW (ref >60)
Glucose, Bld: 201 mg/dL — ABNORMAL HIGH (ref 70–99)
Phosphorus: 4.1 mg/dL (ref 2.5–4.6)
Potassium: 3.4 mmol/L — ABNORMAL LOW (ref 3.5–5.1)
Sodium: 140 mmol/L (ref 135–145)

## 2021-05-14 LAB — PHOSPHORUS: Phosphorus: 3.9 mg/dL (ref 2.5–4.6)

## 2021-05-14 MED ORDER — GLUCERNA SHAKE PO LIQD
237.0000 mL | Freq: Two times a day (BID) | ORAL | Status: DC
  Administered 2021-05-14 – 2021-05-16 (×4): 237 mL via ORAL

## 2021-05-14 NOTE — Progress Notes (Signed)
PROGRESS NOTE    Stephen [name]  VWU:[id number] DOB: Mar 21, 1946 DOA: 04/29/2021 PCP: [name], MD    Brief Narrative:  Mr. Helou wad admitted to the hospital with the working diagnosis of acute decompensated diastolic heart failure.  75 year old male past medical history for interstitial lung disease, pulmonary hypertension, obstructive sleep apnea, chronic hypoxic respiratory failure, coronary artery disease, type 2 diabetes mellitus, chronic kidney disease stage IV who presented with chest pain and dyspnea.  Recent hospitalization 10/16-2010/29 for non-STEMI and heart failure, 2 days after his discharge he had recurrent chest pain and dyspnea.  EMS was called and his oxygen saturation was 91% on 4 L, he was placed on a nonrebreather and transported to the hospital.  On his initial physical examination his oximetry was 98% on 6 L, his respiratory rate was 22, blood pressure 123/53, heart rate 59, temperature 98.4, his lungs had decreased breath sounds bilaterally, heart S1-S2, present, rhythmic, abdomen soft nontender, positive 3+ lower extremity edema.  Sodium 135, potassium 4.5, chloride 99, bicarb 28, glucose 241, BUN 72, creatinine 3.12, BNP 1205, high sensitive troponin 1417, 1514. white count 11.6, hemoglobin 9.7, hematocrit 39.8, platelets 332. SARS COVID-19 negative.  Urinalysis specific gravity 1.014, RBCs 0-5, white cells 0-5.  Chest radiograph with cardiomegaly, congested hilar vasculature.  EKG 60 bpm, normal axis, left bundle branch block sinus rhythm, Q-wave V1-V3, no significant ST segment or T wave changes.  Patient was admitted to the progressive care unit, he was placed on furosemide for diuresis.  Assessment & Plan:   Principal Problem:   Acute on chronic diastolic CHF (congestive heart failure) (HCC) Active Problems:   Elevated troponin   Unstable angina (HCC)   Pulmonary hypertension (HCC)   Acute on chronic respiratory failure with hypoxia and  hypercapnia (HCC)   CKD (chronic kidney disease), stage IV (HCC)   Interstitial lung disease (HCC)   B12 deficiency   Pressure injury of skin   Moderate major depression, single episode (HCC)   Atypical chest pain   SOB (shortness of breath)   Acute on chronic diastolic heart failure exacerbation, acute cardiogenic pulmonary edema with acute hypoxemic respiratory failure. Clinically improving but continue to have hypervolemia. Urine output over last 24 hrs 2,150 ml, since admission negative fluid balance 14,507 ml.   Plan to continue diuresis with IV furosemide Close blood pressure monitoring Continue with carvedilol.   2. AKI on CKD stage IV, hypokalemia Renal function with serum cr at 3,20 with K at 3,4 and serum bicarbonate at 37. Continue diuresis with furosemide Follow up renal function in am Add 20 meq Kcl x1  Anemia of chronic renal disease with iron deficiency Continue with EPO.   3. HTN Continue blood pressure monitoring Continue blood pressure control with carvedilol, clonidine, hydralazine   4. T2DM/ dyslipidemia Fasting glucose 201, continue insulin sliding scale for glucose cover and monitoring  Basal insulin 30 units.  Continue with statin therapy and ezetimibe   5, Obesity class 3, OSA. Bilateral buttocks pressure ulcer stage 2 (present on admission) Use Bipap and Cpap as needed Continue with local skin care.  6. Hypothyroid. Continue with levothyroxine   Patient continue to be at high risk for worsening renal and heart failure  Status is: Inpatient  Remains inpatient appropriate because: IV diuresis    DVT prophylaxis: Enoxaparin   Code Status:    full  Family Communication:   I spoke with patient's wife at the bedside, we talked in detail about patient's condition, plan of  care and prognosis and all questions were addressed.      Skin Documentation: Pressure Injury 05/04/21 Buttocks Right;Left Stage 2 -  Partial thickness loss of dermis  presenting as a shallow open injury with a red, pink wound bed without slough. Pressure wound on buttocks (Active)  05/04/21 1145  Location: Buttocks  Location Orientation: Right;Left  Staging: Stage 2 -  Partial thickness loss of dermis presenting as a shallow open injury with a red, pink wound bed without slough.  Wound Description (Comments): Pressure wound on buttocks  Present on Admission: Yes     Pressure Injury 05/11/21 Buttocks Right Stage 2 -  Partial thickness loss of dermis presenting as a shallow open injury with a red, pink wound bed without slough. (Active)  05/11/21 1000  Location: Buttocks  Location Orientation: Right  Staging: Stage 2 -  Partial thickness loss of dermis presenting as a shallow open injury with a red, pink wound bed without slough.  Wound Description (Comments):   Present on Admission:      Pressure Injury 05/11/21 Buttocks Left Stage 2 -  Partial thickness loss of dermis presenting as a shallow open injury with a red, pink wound bed without slough. (Active)  05/11/21 1000  Location: Buttocks  Location Orientation: Left  Staging: Stage 2 -  Partial thickness loss of dermis presenting as a shallow open injury with a red, pink wound bed without slough.  Wound Description (Comments):   Present on Admission:      Consultants:  Cardiology  Nephrology    Subjective: Patient with no nausea or vomiting, dyspnea has been improving but not back to baseline, continue to be very weak and deconditioned   Objective: Vitals:   05/14/21 0030 05/14/21 0354 05/14/21 0806 05/14/21 1200  BP: (!) 136/53 (!) 137/56 (!) 166/63 (!) 153/91  Pulse: (!) 51 (!) 50 (!) 55 (!) 54  Resp: 14 16 18 18   Temp: 98.2 F (36.8 C) 97.8 F (36.6 C) 98.2 F (36.8 C) 97.9 F (36.6 C)  TempSrc:   Oral Oral  SpO2: 100% 99% 100% 97%  Weight:      Height:        Intake/Output Summary (Last 24 hours) at 05/14/2021 1517 Last data filed at 05/14/2021 1221 Gross per 24 hour   Intake 465.86 ml  Output 1925 ml  Net -1459.14 ml   Filed Weights   05/08/21 0500 05/10/21 0310 05/11/21 1021  Weight: 108.2 kg 117.5 kg 110.3 kg    Examination:   General: Not in pain or dyspnea, deconditioned  Neurology: Awake and alert, non focal  E ENT: mild pallor, no icterus, oral mucosa moist Cardiovascular: No JVD. S1-S2 present, rhythmic, no gallops, rubs, or murmurs. ++ bilateral lower extremity edema. Pulmonary: positive breath sounds bilaterally,with no wheezing, scattered rales, anterior auscultation  Gastrointestinal. Abdomen soft and non tender Skin. No rashes Musculoskeletal: no joint deformities     Data Reviewed: I have personally reviewed following labs and imaging studies  CBC: Recent Labs  Lab 05/10/21 0501 05/11/21 0422 05/12/21 0429 05/13/21 0524 05/14/21 0601  WBC 4.3 3.9* 4.3 4.6 4.1  NEUTROABS 3.0 2.6 2.7  --   --   HGB 8.4* 8.4* 8.5* 8.5* 7.9*  HCT 27.2* 27.6* 27.7* 27.3* 25.8*  MCV 88.0 88.7 89.6 88.9 89.0  PLT 259 246 266 263 274   Basic Metabolic Panel: Recent Labs  Lab 05/10/21 0501 05/11/21 0422 05/12/21 0428 05/12/21 1017 05/13/21 0524 05/14/21 0601  NA 138 137 136  --  137  139 140  K 3.9 3.5 3.2*  --  3.5  3.6 3.4*  CL 98 96* 94*  --  94*  96* 93*  CO2 30 33* 34*  --  34*  35* 37*  GLUCOSE 162* 215* 162*  --  193*  192* 201*  BUN 79* 85* 86*  --  82*  90* 93*  CREATININE 3.59* 3.64* 3.25*  --  3.19*  3.29* 3.20*  CALCIUM 8.0* 8.0* 7.7*  --  8.0*  8.1* 8.4*  MG  --   --   --  2.0 2.3 2.1  PHOS  --  5.5* 4.8*  --  3.4  3.7 3.9  4.1   GFR: Estimated Creatinine Clearance: 23.6 mL/min (A) (by C-G formula based on SCr of 3.2 mg/dL (H)). Liver Function Tests: Recent Labs  Lab 05/11/21 0422 05/12/21 0428 05/13/21 0524 05/14/21 0601  ALBUMIN 1.9* 1.9* 1.9* 2.0*   No results for input(s): LIPASE, AMYLASE in the last 168 hours. No results for input(s): AMMONIA in the last 168 hours. Coagulation Profile: No  results for input(s): INR, PROTIME in the last 168 hours. Cardiac Enzymes: No results for input(s): CKTOTAL, CKMB, CKMBINDEX, TROPONINI in the last 168 hours. BNP (last 3 results) No results for input(s): PROBNP in the last 8760 hours. HbA1C: No results for input(s): HGBA1C in the last 72 hours. CBG: Recent Labs  Lab 05/13/21 1115 05/13/21 1623 05/13/21 2212 05/14/21 0808 05/14/21 1201  GLUCAP 160* 182* 217* 185* 206*   Lipid Profile: No results for input(s): CHOL, HDL, LDLCALC, TRIG, CHOLHDL, LDLDIRECT in the last 72 hours. Thyroid Function Tests: No results for input(s): TSH, T4TOTAL, FREET4, T3FREE, THYROIDAB in the last 72 hours. Anemia Panel: No results for input(s): VITAMINB12, FOLATE, FERRITIN, TIBC, IRON, RETICCTPCT in the last 72 hours.    Radiology Studies: I have reviewed all of the imaging during this hospital visit personally     Scheduled Meds:  acetaminophen  650 mg Oral TID   aspirin EC  81 mg Oral Daily   atorvastatin  80 mg Oral QHS   calcitRIOL  0.25 mcg Oral Daily   carvedilol  25 mg Oral BID WC   Chlorhexidine Gluconate Cloth  6 each Topical Daily   cloNIDine  0.1 mg Oral TID   clopidogrel  75 mg Oral Daily   dorzolamide-timolol  1 drop Both Eyes BID   epoetin (EPOGEN/PROCRIT) injection  3,000 Units Subcutaneous Q14 Days   escitalopram  5 mg Oral Daily   ezetimibe  10 mg Oral Daily   feeding supplement (GLUCERNA SHAKE)  237 mL Oral BID BM   fluticasone furoate-vilanterol  1 puff Inhalation Daily   And   umeclidinium bromide  1 puff Inhalation Daily   furosemide  80 mg Oral BID   gabapentin  100 mg Oral BID   hydrALAZINE  25 mg Oral TID   insulin aspart  0-20 Units Subcutaneous TID WC   insulin aspart  0-5 Units Subcutaneous QHS   insulin aspart  3 Units Subcutaneous TID WC   insulin detemir  30 Units Subcutaneous QHS   isosorbide mononitrate  120 mg Oral Daily   levothyroxine  50 mcg Oral Q0600   montelukast  10 mg Oral Daily    pantoprazole  40 mg Oral Daily   sodium chloride flush  3 mL Intravenous Q12H   tamsulosin  0.4 mg Oral QHS   cyanocobalamin  1,000 mcg Oral Daily   Continuous Infusions:  sodium chloride 10 mL/hr  at 05/13/21 1614     LOS: 11 days        Latrice Storlie Annett Gula, MD

## 2021-05-14 NOTE — Progress Notes (Signed)
The patient woke up complaining of excruciating abdominal pain on the lower abdomen. I flushed his Foley with 60 cc of NS. Received 900 cc of red urine. Patient verbalized relief and went back to sleep. Will continue to monitor.

## 2021-05-14 NOTE — Progress Notes (Signed)
Physical Therapy Treatment Patient Details Name: Stephen [name] MRN: [id number] DOB: 05-26-1946 Today's Date: 05/14/2021   History of Present Illness Stephen [name] is a 75 y.o. male with medical history significant for , interstitial lung disease, pulmonary hypertension, OSA with chronic respiratory failure on home O2 at 4L and Bipap, followed by pulmonology, as well as diastolic heart failure, CAD, DM, CKD IV, recently hospitalized from 10/16-10/29 with NSTEMI treated with heparin infusion, and respiratory failure secondary to CHF and COPD exacerbation who returns to the ED 2 days later with complaints of chest pain and shortness of breath. Chest pain was severe heaviness on his chest.EMS reports sats of 91% on his normal 4 L.  They attempted to increase it to 6 L with minimal improvement to 93% and and then put him on a nonrebreather and sats were 100%.  Wife contributes to history .    PT Comments    Pt was long sitting in bed upon arriving with supportive wife at bedside. He endorses nausea but was willing to participate with encouragement. Urologist arrived and performed bladder irrigation with author assistance, per request. Pt required encouragement for OOB activity. Plan was to exit R side of bed and ambulate around bed to recliner. This did not happen. Pt is to deconditioned and has loose BM upon standing. He requires max assist to exit and return to R side of bed. Stood ~ 4 separate times during session with progressively more assistance due to fatigue. He did ambulate ~ 5 ft to [facility] with author assisting throughout. Total assist for hygiene care post BM.He remains very limited and is extremely tired after minimal activity. Family would like pt to pursue SNF at [place] to maximize independence with ADLs. Pt will require extensive PT going forward to achieve this goal. Palliative is following. Acute PT will continue to follow and progress pt, as able, per current POC.    Recommendations for  follow up therapy are one component of a multi-disciplinary discharge planning process, led by the attending physician.  Recommendations may be updated based on patient status, additional functional criteria and insurance authorization.  Follow Up Recommendations  Skilled nursing-short term rehab (<3 hours/day) (pt requesting rehab at [place])     Assistance Recommended at Discharge Frequent or constant Supervision/Assistance  Equipment Recommendations  Other (comment) (defer to next level of care)       Precautions / Restrictions Precautions Precautions: Fall Restrictions Weight Bearing Restrictions: No     Mobility  Bed Mobility Overal bed mobility: Needs Assistance Bed Mobility: Supine to Sit     Supine to sit: Max assist;HOB elevated Sit to supine: Max assist;HOB elevated   General bed mobility comments: Pt requires max assist of one to exit R side of bed. increased time to perform with vcs for technique improvements throughout    Transfers Overall transfer level: Needs assistance Equipment used: Rolling walker (2 wheels) Transfers: Sit to/from Stand Sit to Stand: Mod assist;From elevated surface;Max assist           General transfer comment: pt stood from EOB 3 x and 2 x from [facility] requires increased assistance as he fatigues. Overall poor activity tolerance.    Ambulation/Gait Ambulation/Gait assistance: Min assist;Mod assist Gait Distance (Feet): 5 Feet Assistive device: Rolling walker (2 wheels) Gait Pattern/deviations: Step-to pattern;Trunk flexed;Shuffle Gait velocity: decreased     General Gait Details: Plan was to ambulate around EOB to recliner however pt starts having BM during and required use of BSC. pt is too  deconditioned to ambulate greater distances safely. Very fatigued with gait only ~ 5 ft. needs assistance propeling RW and turning.After having BM, pt requested to return to bed.      Balance Overall balance assessment: Needs  assistance Sitting-balance support: Feet supported;Bilateral upper extremity supported Sitting balance-Leahy Scale: Fair     Standing balance support: Bilateral upper extremity supported;Reliant on assistive device for balance Standing balance-Leahy Scale: Poor Standing balance comment: patient tremoulous in standing. Legs bouncing, but no buckling with weight shifting. Patient seems anxious with mobilizing       Cognition Arousal/Alertness: Awake/alert Behavior During Therapy: Flat affect Overall Cognitive Status: Within Functional Limits for tasks assessed Area of Impairment: Following commands;Safety/judgement;Problem solving      Following Commands: Follows one step commands consistently;Follows one step commands with increased time       General Comments: Pt is A and oriented however has flat affect. Supportive spouse at bedside throughout. Does not conversate much endless spoken too. Urologist arrived during session.               Pertinent Vitals/Pain Pain Assessment:  (did not rate. has L wrist pain and R shoulder pain.)     PT Goals (current goals can now be found in the care plan section) Acute Rehab PT Goals Patient Stated Goal: none stated Progress towards PT goals: Not progressing toward goals - comment (deconditioned)    Frequency    Min 2X/week      PT Plan Discharge plan needs to be updated    Co-evaluation     PT goals addressed during session: Mobility/safety with mobility;Balance;Proper use of DME;Strengthening/ROM        AM-PAC PT "6 Clicks" Mobility   Outcome Measure  Help needed turning from your back to your side while in a flat bed without using bedrails?: A Lot Help needed moving from lying on your back to sitting on the side of a flat bed without using bedrails?: A Lot Help needed moving to and from a bed to a chair (including a wheelchair)?: A Lot Help needed standing up from a chair using your arms (e.g., wheelchair or bedside  chair)?: A Lot Help needed to walk in hospital room?: A Lot Help needed climbing 3-5 steps with a railing? : Total 6 Click Score: 11    End of Session Equipment Utilized During Treatment: Oxygen Activity Tolerance: Patient limited by fatigue Patient left: in bed;with call bell/phone within reach;with bed alarm set;with family/visitor present Nurse Communication: Mobility status PT Visit Diagnosis: Other abnormalities of gait and mobility (R26.89);Muscle weakness (generalized) (M62.81);Difficulty in walking, not elsewhere classified (R26.2)     Time: 3903-0092 PT Time Calculation (min) (ACUTE ONLY): 41 min  Charges:  $Gait Training: 8-22 mins $Therapeutic Activity: 23-37 mins                    Julaine Fusi PTA 05/14/21, 12:58 PM

## 2021-05-14 NOTE — Progress Notes (Signed)
Urology Inpatient Progress Note  Subjective: Patient had episode of Foley catheter obstruction last night.  This was relieved with irrigation by the nursing staff.  His catheter has been draining well since that time. He is afebrile with stable vital signs. Hemoglobin slightly decreased to 7.9.  Creatinine stable at 3.20. Foley catheter draining maroon-colored urine.  Anti-infectives: Anti-infectives (From admission, onward)    Start     Dose/Rate Route Frequency Ordered Stop   05/08/21 2000  cefTRIAXone (ROCEPHIN) 1 g in sodium chloride 0.9 % 100 mL IVPB        1 g 200 mL/hr over 30 Minutes Intravenous Every 24 hours 05/08/21 1817 05/12/21 2315   04/25/2021 1000  doxycycline (VIBRA-TABS) tablet 100 mg        100 mg Oral Every 12 hours 04/28/2021 0725 05/05/21 2123       Current Facility-Administered Medications  Medication Dose Route Frequency Provider Last Rate Last Admin   0.9 %  sodium chloride infusion   Intravenous PRN Sharion Settler, NP 10 mL/hr at 05/13/21 1614 Infusion Verify at 05/13/21 1614   acetaminophen (TYLENOL) tablet 650 mg  650 mg Oral TID Earlie Counts, NP   650 mg at 05/14/21 0850   albuterol (PROVENTIL) (2.5 MG/3ML) 0.083% nebulizer solution 2.5 mg  2.5 mg Inhalation Q6H PRN Loletha Grayer, MD   2.5 mg at 05/08/21 2158   ALPRAZolam Duanne Moron) tablet 0.5 mg  0.5 mg Oral TID PRN Shelly Coss, MD   0.5 mg at 05/09/21 2226   aspirin EC tablet 81 mg  81 mg Oral Daily Athena Masse, MD   81 mg at 05/14/21 0849   atorvastatin (LIPITOR) tablet 80 mg  80 mg Oral QHS Athena Masse, MD   80 mg at 05/13/21 2241   calcitRIOL (ROCALTROL) capsule 0.25 mcg  0.25 mcg Oral Daily Loletha Grayer, MD   0.25 mcg at 05/14/21 0851   carvedilol (COREG) tablet 25 mg  25 mg Oral BID WC Athena Masse, MD   25 mg at 05/14/21 0850   Chlorhexidine Gluconate Cloth 2 % PADS 6 each  6 each Topical Daily Cristal Deer, MD   6 each at 05/13/21 1027   cloNIDine (CATAPRES) tablet 0.1 mg   0.1 mg Oral TID End, Harrell Gave, MD   0.1 mg at 05/14/21 0850   clopidogrel (PLAVIX) tablet 75 mg  75 mg Oral Daily Loletha Grayer, MD   75 mg at 05/14/21 0849   dorzolamide-timolol (COSOPT) 22.3-6.8 MG/ML ophthalmic solution 1 drop  1 drop Both Eyes BID Loletha Grayer, MD   1 drop at 05/14/21 1000   epoetin alfa (EPOGEN) injection 3,000 Units  3,000 Units Subcutaneous Q14 Days Liana Gerold, MD   3,000 Units at 05/09/21 0609   escitalopram (LEXAPRO) tablet 5 mg  5 mg Oral Daily Clapacs, Madie Reno, MD   5 mg at 05/14/21 0851   ezetimibe (ZETIA) tablet 10 mg  10 mg Oral Daily Loletha Grayer, MD   10 mg at 05/14/21 0849   feeding supplement (GLUCERNA SHAKE) (GLUCERNA SHAKE) liquid 237 mL  237 mL Oral BID BM Arrien, Jimmy Picket, MD       fluticasone furoate-vilanterol (BREO ELLIPTA) 200-25 MCG/ACT 1 puff  1 puff Inhalation Daily Loletha Grayer, MD   1 puff at 05/14/21 1000   And   umeclidinium bromide (INCRUSE ELLIPTA) 62.5 MCG/ACT 1 puff  1 puff Inhalation Daily Loletha Grayer, MD   1 puff at 05/14/21 1003   furosemide (LASIX) tablet  80 mg  80 mg Oral BID Colon Flattery, NP   80 mg at 05/14/21 0849   gabapentin (NEURONTIN) capsule 100 mg  100 mg Oral BID Loletha Grayer, MD   100 mg at 05/14/21 0849   hydrALAZINE (APRESOLINE) tablet 25 mg  25 mg Oral TID Minna Merritts, MD   25 mg at 05/14/21 0849   HYDROcodone-acetaminophen (NORCO/VICODIN) 5-325 MG per tablet 1 tablet  1 tablet Oral Q6H PRN Shelly Coss, MD   1 tablet at 05/14/21 1204   insulin aspart (novoLOG) injection 0-20 Units  0-20 Units Subcutaneous TID WC Athena Masse, MD   7 Units at 05/14/21 1216   insulin aspart (novoLOG) injection 0-5 Units  0-5 Units Subcutaneous QHS Athena Masse, MD   2 Units at 05/13/21 2238   insulin aspart (novoLOG) injection 3 Units  3 Units Subcutaneous TID WC Val Riles, MD   3 Units at 05/14/21 1216   insulin detemir (LEVEMIR) injection 30 Units  30 Units Subcutaneous QHS  Loletha Grayer, MD   30 Units at 05/13/21 2239   isosorbide mononitrate (IMDUR) 24 hr tablet 120 mg  120 mg Oral Daily Judd Gaudier V, MD   120 mg at 05/14/21 0849   levothyroxine (SYNTHROID) tablet 50 mcg  50 mcg Oral Q0600 Athena Masse, MD   50 mcg at 05/14/21 0636   montelukast (SINGULAIR) tablet 10 mg  10 mg Oral Daily Loletha Grayer, MD   10 mg at 05/14/21 0849   nitroGLYCERIN (NITROSTAT) SL tablet 0.4 mg  0.4 mg Sublingual Q5 min PRN Loletha Grayer, MD       ondansetron [facility]) injection 4 mg  4 mg Intravenous Q6H PRN Athena Masse, MD   4 mg at 05/14/21 1000   pantoprazole (PROTONIX) EC tablet 40 mg  40 mg Oral Daily Loletha Grayer, MD   40 mg at 05/14/21 0849   polyethylene glycol (MIRALAX / GLYCOLAX) packet 17 g  17 g Oral Daily PRN Loletha Grayer, MD   17 g at 05/06/21 2101   sodium chloride flush (NS) 0.9 % injection 3 mL  3 mL Intravenous Q12H Shawna Clamp, MD   3 mL at 05/14/21 1000   tamsulosin (FLOMAX) capsule 0.4 mg  0.4 mg Oral QHS Loletha Grayer, MD   0.4 mg at 05/13/21 2241   vitamin B-12 (CYANOCOBALAMIN) tablet 1,000 mcg  1,000 mcg Oral Daily Loletha Grayer, MD   1,000 mcg at 05/14/21 0849     Objective: Vital signs in last 24 hours: Temp:  [97.8 F (36.6 C)-98.2 F (36.8 C)] 97.9 F (36.6 C) (11/12 1200) Pulse Rate:  [50-56] 54 (11/12 1200) Resp:  [14-18] 18 (11/12 1200) BP: (136-166)/(52-91) 153/91 (11/12 1200) SpO2:  [97 %-100 %] 97 % (11/12 1200)  Intake/Output from previous day: 11/11 0701 - 11/12 0700 In: 465.9 [P.O.:240; I.V.:225.9] Out: 2150 [Urine:2150] Intake/Output this shift: No intake/output data recorded.  GENERAL APPEARANCE:   NAD HEENT:  Atraumatic, normocephalic NECK:  Supple  ABDOMEN:  Soft, non-tender, no masses EXTREMITIES:  without clubbing, cyanosis, or edema NEUROLOGIC:  Alert and oriented x 3, normal gait, CN II-XII grossly intact MENTAL STATUS:  appropriate SKIN:  Warm, dry, and intact GU:  foley draining  maroon colored urine   Lab Results:  Recent Labs    05/13/21 0524 05/14/21 0601  WBC 4.6 4.1  HGB 8.5* 7.9*  HCT 27.3* 25.8*  PLT 263 274   BMET Recent Labs    05/13/21 0524 05/14/21 0601  NA 137  139 140  K 3.5  3.6 3.4*  CL 94*  96* 93*  CO2 34*  35* 37*  GLUCOSE 193*  192* 201*  BUN 82*  90* 93*  CREATININE 3.19*  3.29* 3.20*  CALCIUM 8.0*  8.1* 8.4*   PT/INR No results for input(s): LABPROT, INR in the last 72 hours. ABG No results for input(s): PHART, HCO3 in the last 72 hours.  Invalid input(s): PCO2, PO2  Studies/Results: No results found.   Assessment & Plan: Gross hematuria Urinary retention  Patient's Foley catheter was irrigated under sterile conditions with 500 mL of sterile saline.  There was return of some small clot material.  His urine was light pink at the completion of irrigation.  He tolerated procedure well.  Continue Foley catheter with manual irrigations as needed. Follow H&H. Continue Flomax. Plan for outpatient voiding trial late next week followed by hematuria evaluation as an outpatient.  Michaelle Birks, MD 05/14/2021

## 2021-05-14 NOTE — Progress Notes (Signed)
[facility]  PROGRESS NOTE   Subjective:   Events noted.  Foley catheter was irrigated last night. Foley still shows hematuria. Family at bedside.  Objective:  Vital signs in last 24 hours:  Temp:  [97.8 F (36.6 C)-98.2 F (36.8 C)] 98 F (36.7 C) (11/12 1540) Pulse Rate:  [50-55] 53 (11/12 1540) Resp:  [14-18] 18 (11/12 1540) BP: (136-166)/(52-91) 146/76 (11/12 1540) SpO2:  [95 %-100 %] 95 % (11/12 1540)  Weight change:  Filed Weights   05/08/21 0500 05/10/21 0310 05/11/21 1021  Weight: 108.2 kg 117.5 kg 110.3 kg    Intake/Output: I/O last 3 completed shifts: In: 565.9 [P.O.:240; I.V.:325.9] Out: 3050 [Urine:3050]   Intake/Output this shift:  Total I/O In: -  Out: 525 [Urine:525]  Physical Exam: General:  No acute distress  Head:  Normocephalic, atraumatic. Moist oral mucosal membranes  Eyes:  Anicteric  Neck:  Supple  Lungs:   Clear to auscultation, normal effort  Heart:  S1S2 no rubs  Abdomen:   Soft, nontender, bowel sounds present  Extremities:  peripheral edema.  Neurologic:  Awake, alert, following commands  Skin:  No lesions  Access:     Basic Metabolic Panel: Recent Labs  Lab 05/10/21 0501 05/11/21 0422 05/12/21 0428 05/12/21 1017 05/13/21 0524 05/14/21 0601  NA 138 137 136  --  137  139 140  K 3.9 3.5 3.2*  --  3.5  3.6 3.4*  CL 98 96* 94*  --  94*  96* 93*  CO2 30 33* 34*  --  34*  35* 37*  GLUCOSE 162* 215* 162*  --  193*  192* 201*  BUN 79* 85* 86*  --  82*  90* 93*  CREATININE 3.59* 3.64* 3.25*  --  3.19*  3.29* 3.20*  CALCIUM 8.0* 8.0* 7.7*  --  8.0*  8.1* 8.4*  MG  --   --   --  2.0 2.3 2.1  PHOS  --  5.5* 4.8*  --  3.4  3.7 3.9  4.1    CBC: Recent Labs  Lab 05/10/21 0501 05/11/21 0422 05/12/21 0429 05/13/21 0524 05/14/21 0601  WBC 4.3 3.9* 4.3 4.6 4.1  NEUTROABS 3.0 2.6 2.7  --   --   HGB 8.4* 8.4* 8.5* 8.5* 7.9*  HCT 27.2* 27.6* 27.7* 27.3* 25.8*  MCV 88.0 88.7 89.6 88.9 89.0  PLT 259 246  266 263 274     Urinalysis: No results for input(s): COLORURINE, LABSPEC, PHURINE, GLUCOSEU, HGBUR, BILIRUBINUR, KETONESUR, PROTEINUR, UROBILINOGEN, NITRITE, LEUKOCYTESUR in the last 72 hours.  Invalid input(s): APPERANCEUR    Imaging: No results found.   Medications:    sodium chloride 10 mL/hr at 05/13/21 1614    acetaminophen  650 mg Oral TID   aspirin EC  81 mg Oral Daily   atorvastatin  80 mg Oral QHS   calcitRIOL  0.25 mcg Oral Daily   carvedilol  25 mg Oral BID WC   Chlorhexidine Gluconate Cloth  6 each Topical Daily   cloNIDine  0.1 mg Oral TID   clopidogrel  75 mg Oral Daily   dorzolamide-timolol  1 drop Both Eyes BID   epoetin (EPOGEN/PROCRIT) injection  3,000 Units Subcutaneous Q14 Days   escitalopram  5 mg Oral Daily   ezetimibe  10 mg Oral Daily   feeding supplement (GLUCERNA SHAKE)  237 mL Oral BID BM   fluticasone furoate-vilanterol  1 puff Inhalation Daily   And   umeclidinium bromide  1 puff Inhalation Daily  furosemide  80 mg Oral BID   gabapentin  100 mg Oral BID   hydrALAZINE  25 mg Oral TID   insulin aspart  0-20 Units Subcutaneous TID WC   insulin aspart  0-5 Units Subcutaneous QHS   insulin aspart  3 Units Subcutaneous TID WC   insulin detemir  30 Units Subcutaneous QHS   isosorbide mononitrate  120 mg Oral Daily   levothyroxine  50 mcg Oral Q0600   montelukast  10 mg Oral Daily   pantoprazole  40 mg Oral Daily   sodium chloride flush  3 mL Intravenous Q12H   tamsulosin  0.4 mg Oral QHS   cyanocobalamin  1,000 mcg Oral Daily    Assessment/ Plan:     Principal Problem:   Acute on chronic diastolic CHF (congestive heart failure) (HCC) Active Problems:   Elevated troponin   Unstable angina (HCC)   Pulmonary hypertension (HCC)   Acute on chronic respiratory failure with hypoxia and hypercapnia (HCC)   CKD (chronic kidney disease), stage IV (HCC)   Interstitial lung disease (HCC)   B12 deficiency   Pressure injury of skin   Moderate  major depression, single episode (HCC)   Atypical chest pain   SOB (shortness of breath)  75 year old male with history of hypertension, coronary artery disease, congestive heart failure, diabetes, peripheral vascular disease, CKD stage IV, anemia now admitted with history of fluid overload and COPD exacerbation.  #1: Urinary retention/hematuria.  Patient is being followed by urology.  Had irrigation of the Foley catheter last night.  Patient continued to have hematuria.  #2: Congestive heart failure: We will maintain on 1000 cc fluid restriction.  Continue the Lasix at 80 mg twice a day.  May also need metolazone.  #3: Acute kidney injury on the top of chronic kidney disease: Worsening renal indicis possibly secondary to decreased cardiac output.  Maintain on Lasix.  May need renal replacement therapy in the near future.  #4: Anemia: Continue to monitor hemoglobin levels.  May need blood transfusion.  Patient also needs erythropoietin and iron supplementation.  Spoke to the family at bedside.  Will monitor closely.   LOS: [place], [facility] 11/12/20224:24 PM

## 2021-05-15 ENCOUNTER — Inpatient Hospital Stay: Payer: Medicare HMO

## 2021-05-15 LAB — RENAL FUNCTION PANEL
Albumin: 2.1 g/dL — ABNORMAL LOW (ref 3.5–5.0)
Anion gap: 9 (ref 5–15)
BUN: 88 mg/dL — ABNORMAL HIGH (ref 8–23)
CO2: 36 mmol/L — ABNORMAL HIGH (ref 22–32)
Calcium: 8.5 mg/dL — ABNORMAL LOW (ref 8.9–10.3)
Chloride: 92 mmol/L — ABNORMAL LOW (ref 98–111)
Creatinine, Ser: 3.25 mg/dL — ABNORMAL HIGH (ref 0.61–1.24)
GFR, Estimated: 19 mL/min — ABNORMAL LOW (ref >60)
Glucose, Bld: 253 mg/dL — ABNORMAL HIGH (ref 70–99)
Phosphorus: 3.6 mg/dL (ref 2.5–4.6)
Potassium: 3.5 mmol/L (ref 3.5–5.1)
Sodium: 137 mmol/L (ref 135–145)

## 2021-05-15 LAB — CBC WITH DIFFERENTIAL/PLATELET
Abs Immature Granulocytes: 0.08 10*3/uL — ABNORMAL HIGH (ref 0.00–0.07)
Basophils Absolute: 0 10*3/uL (ref 0.0–0.1)
Basophils Relative: 0 %
Eosinophils Absolute: 0.1 10*3/uL (ref 0.0–0.5)
Eosinophils Relative: 1 %
HCT: 27.4 % — ABNORMAL LOW (ref 39.0–52.0)
Hemoglobin: 8.4 g/dL — ABNORMAL LOW (ref 13.0–17.0)
Immature Granulocytes: 1 %
Lymphocytes Relative: 4 %
Lymphs Abs: 0.5 10*3/uL — ABNORMAL LOW (ref 0.7–4.0)
MCH: 26.9 pg (ref 26.0–34.0)
MCHC: 30.7 g/dL (ref 30.0–36.0)
MCV: 87.8 fL (ref 80.0–100.0)
Monocytes Absolute: 1.1 10*3/uL — ABNORMAL HIGH (ref 0.1–1.0)
Monocytes Relative: 9 %
Neutro Abs: 10.6 10*3/uL — ABNORMAL HIGH (ref 1.7–7.7)
Neutrophils Relative %: 85 %
Platelets: 312 10*3/uL (ref 150–400)
RBC: 3.12 MIL/uL — ABNORMAL LOW (ref 4.22–5.81)
RDW: 15.9 % — ABNORMAL HIGH (ref 11.5–15.5)
WBC: 12.4 10*3/uL — ABNORMAL HIGH (ref 4.0–10.5)
nRBC: 0 % (ref 0.0–0.2)

## 2021-05-15 LAB — GLUCOSE, CAPILLARY
Glucose-Capillary: 173 mg/dL — ABNORMAL HIGH (ref 70–99)
Glucose-Capillary: 196 mg/dL — ABNORMAL HIGH (ref 70–99)
Glucose-Capillary: 220 mg/dL — ABNORMAL HIGH (ref 70–99)
Glucose-Capillary: 251 mg/dL — ABNORMAL HIGH (ref 70–99)
Glucose-Capillary: 269 mg/dL — ABNORMAL HIGH (ref 70–99)

## 2021-05-15 LAB — BLOOD GAS, ARTERIAL
Acid-Base Excess: 21.9 mmol/L — ABNORMAL HIGH (ref 0.0–2.0)
Bicarbonate: 47.4 mmol/L — ABNORMAL HIGH (ref 20.0–28.0)
FIO2: 0.32
O2 Saturation: 95.8 %
Patient temperature: 37
pCO2 arterial: 58 mmHg — ABNORMAL HIGH (ref 32.0–48.0)
pH, Arterial: 7.52 — ABNORMAL HIGH (ref 7.350–7.450)
pO2, Arterial: 72 mmHg — ABNORMAL LOW (ref 83.0–108.0)

## 2021-05-15 LAB — PROCALCITONIN: Procalcitonin: 0.16 ng/mL

## 2021-05-15 MED ORDER — METOCLOPRAMIDE HCL 5 MG/ML IJ SOLN
5.0000 mg | Freq: Three times a day (TID) | INTRAMUSCULAR | Status: DC
  Administered 2021-05-15: 5 mg via INTRAVENOUS
  Filled 2021-05-15: qty 2

## 2021-05-15 NOTE — Progress Notes (Signed)
Discussed with family POC, also addressed concerns rt elevated temp and new confusion today by secure messaging on call Sharion Settler NP at this time to update on pt status. Will await response from Hassan Rowan and keep family updated.

## 2021-05-15 NOTE — Progress Notes (Addendum)
PROGRESS NOTE    Stephen [name]  ZOX:[id number] DOB: 10/22/1945 DOA: 04/04/2021 PCP: [name], MD    Brief Narrative:  Stephen [name] wad admitted to the hospital with the working diagnosis of acute decompensated diastolic heart failure.   75 year old male past medical history for interstitial lung disease, pulmonary hypertension, obstructive sleep apnea, chronic hypoxic respiratory failure, coronary artery disease, type 2 diabetes mellitus, chronic kidney disease stage IV who presented with chest pain and dyspnea.  Recent hospitalization 10/16-2010/29 for non-STEMI and heart failure, 2 days after his discharge he had recurrent chest pain and dyspnea.  EMS was called and his oxygen saturation was 91% on 4 L, he was placed on a nonrebreather and transported to the hospital.  On his initial physical examination his oximetry was 98% on 6 L, his respiratory rate was 22, blood pressure 123/53, heart rate 59, temperature 98.4, his lungs had decreased breath sounds bilaterally, heart S1-S2, present, rhythmic, abdomen soft nontender, positive 3+ lower extremity edema.   Sodium 135, potassium 4.5, chloride 99, bicarb 28, glucose 241, BUN 72, creatinine 3.12, BNP 1205, high sensitive troponin 1417, 1514. white count 11.6, hemoglobin 9.7, hematocrit 39.8, platelets 332. SARS COVID-19 negative.   Urinalysis specific gravity 1.014, RBCs 0-5, white cells 0-5.   Chest radiograph with cardiomegaly, congested hilar vasculature.   EKG 60 bpm, normal axis, left bundle branch block sinus rhythm, Q-wave V1-V3, no significant ST segment or T wave changes.   Patient was admitted to the progressive care unit, he was placed on furosemide for diuresis.   Clinically has been improving with negative fluid balance, improvement in dyspnea and oxygenation.   Hospitalization complicated with urinary retention and hematuria. Urology was consulted and Foley cathter was placed.   Assessment & Plan:   Principal  Problem:   Acute on chronic diastolic CHF (congestive heart failure) (HCC) Active Problems:   Elevated troponin   Unstable angina (HCC)   Pulmonary hypertension (HCC)   Acute on chronic respiratory failure with hypoxia and hypercapnia (HCC)   CKD (chronic kidney disease), stage IV (HCC)   Interstitial lung disease (HCC)   B12 deficiency   Pressure injury of skin   Moderate major depression, single episode (HCC)   Atypical chest pain   SOB (shortness of breath)   Acute on chronic diastolic heart failure exacerbation, acute cardiogenic pulmonary edema with acute hypoxemic respiratory failure. Volume status continue to improve, edema at his lower extremities, is improving and his oxygen requirements are at 4 l/min per Agency.  Continue to be very weak and deconditioned. Urine output over last 24 hrs  1,125 ml.   Continue diuresis with furosemide 80 mg po bid.   Old records personally reviewed, echocardiogram from 04/2021 with preserved LV systolic function EF 60 to 65%, RV systolic function preserved.    2. AKI on CKD stage IV, hypokalemia Today his renal function has a serum cr of 3,25 with K of 3,5 and serum bicarbonate at 36. He has negative fluid balance since admission at - 15,107 ml  Currently patient is on furosemide 80 mg po bid.   Follow renal function in am, including Mg.   For anemia of chronic renal disease with iron deficiency, on EPO.    3. HTN  Systolic blood pressure 137 to 165 mmHg systolic, continue medical therapy with carvedilol, clonidine, hydralazine    4. T2DM/ dyslipidemia Fasting glucose is 253, capillary 196, 269 and 220.  Plan to continue with basal insulin 30 units and add pre-meal  insulin  3 units.  On statin therapy and ezetimibe    5, Obesity class 3, OSA. Bilateral buttocks pressure ulcer stage 2 (present on admission) Continue with Bipap and Cpap as needed Local skin care.   6. Hypothyroid.   On levothyroxine   7. Anxiety. Continue as needed  alprazolam and escitalopram.   8. Nausea. Likely multifactorial, will continue antiacid therapy with pantoprazole, add pre-meal metoclopramide and continue with as needed ondansetron.  9. Urinary retention with hematuria. Today no macroscopic hematuria in collection bag. Continue foley catheter an follow up with urology recommendations.  Patient continue to be at high risk for worsening heart failure and renal failure   Status is: Inpatient  Remains inpatient appropriate because: hemodynamic and renal monitoring    DVT prophylaxis: Heparin   Code Status:   full  Family Communication:   I spoke with patient's wife at the bedside, we talked in detail about patient's condition, plan of care and prognosis and all questions were addressed.         Skin Documentation: Pressure Injury 05/04/21 Buttocks Right;Left Stage 2 -  Partial thickness loss of dermis presenting as a shallow open injury with a red, pink wound bed without slough. Pressure wound on buttocks (Active)  05/04/21 1145  Location: Buttocks  Location Orientation: Right;Left  Staging: Stage 2 -  Partial thickness loss of dermis presenting as a shallow open injury with a red, pink wound bed without slough.  Wound Description (Comments): Pressure wound on buttocks  Present on Admission: Yes     Pressure Injury 05/11/21 Buttocks Right Stage 2 -  Partial thickness loss of dermis presenting as a shallow open injury with a red, pink wound bed without slough. (Active)  05/11/21 1000  Location: Buttocks  Location Orientation: Right  Staging: Stage 2 -  Partial thickness loss of dermis presenting as a shallow open injury with a red, pink wound bed without slough.  Wound Description (Comments):   Present on Admission:      Pressure Injury 05/11/21 Buttocks Left Stage 2 -  Partial thickness loss of dermis presenting as a shallow open injury with a red, pink wound bed without slough. (Active)  05/11/21 1000  Location: Buttocks   Location Orientation: Left  Staging: Stage 2 -  Partial thickness loss of dermis presenting as a shallow open injury with a red, pink wound bed without slough.  Wound Description (Comments):   Present on Admission:      Consultants:  Nephrology     Subjective: Patient continue to be very weak and deconditioned, positive nausea and poor oral intake, no chest pain, and dyspnea continue to improve along with lower extremity edema.,   Objective: Vitals:   05/14/21 2330 05/15/21 0108 05/15/21 0503 05/15/21 0828  BP:  (!) 145/53 (!) 150/54 (!) 165/61  Pulse:  (!) 56 (!) 58 65  Resp: 18 16 16 18   Temp:  98.7 F (37.1 C) 98.7 F (37.1 C) 98.7 F (37.1 C)  TempSrc:      SpO2:  99% 95% 99%  Weight:      Height:        Intake/Output Summary (Last 24 hours) at 05/15/2021 1011 Last data filed at 05/14/2021 1857 Gross per 24 hour  Intake --  Output 1125 ml  Net -1125 ml   Filed Weights   05/08/21 0500 05/10/21 0310 05/11/21 1021  Weight: 108.2 kg 117.5 kg 110.3 kg    Examination:   General: Not in pain or dyspnea  Neurology:  Awake and alert, non focal  E ENT: mild pallor, no icterus, oral mucosa moist Cardiovascular: No JVD. S1-S2 present, rhythmic, no gallops, rubs, or murmurs. Trace lower extremity edema. Pulmonary: positive breath sounds bilaterally, with no wheezing, rhonchi or rales. On anterior auscultation,  Gastrointestinal. Abdomen soft and non tender Skin. No rashes Musculoskeletal: no joint deformities     Data Reviewed: I have personally reviewed following labs and imaging studies  CBC: Recent Labs  Lab 05/10/21 0501 05/11/21 0422 05/12/21 0429 05/13/21 0524 05/14/21 0601  WBC 4.3 3.9* 4.3 4.6 4.1  NEUTROABS 3.0 2.6 2.7  --   --   HGB 8.4* 8.4* 8.5* 8.5* 7.9*  HCT 27.2* 27.6* 27.7* 27.3* 25.8*  MCV 88.0 88.7 89.6 88.9 89.0  PLT 259 246 266 263 274   Basic Metabolic Panel: Recent Labs  Lab 05/11/21 0422 05/12/21 0428 05/12/21 1017  05/13/21 0524 05/14/21 0601 05/15/21 0449  NA 137 136  --  137  139 140 137  K 3.5 3.2*  --  3.5  3.6 3.4* 3.5  CL 96* 94*  --  94*  96* 93* 92*  CO2 33* 34*  --  34*  35* 37* 36*  GLUCOSE 215* 162*  --  193*  192* 201* 253*  BUN 85* 86*  --  82*  90* 93* 88*  CREATININE 3.64* 3.25*  --  3.19*  3.29* 3.20* 3.25*  CALCIUM 8.0* 7.7*  --  8.0*  8.1* 8.4* 8.5*  MG  --   --  2.0 2.3 2.1  --   PHOS 5.5* 4.8*  --  3.4  3.7 3.9  4.1 3.6   GFR: Estimated Creatinine Clearance: 23.2 mL/min (A) (by C-G formula based on SCr of 3.25 mg/dL (H)). Liver Function Tests: Recent Labs  Lab 05/11/21 0422 05/12/21 0428 05/13/21 0524 05/14/21 0601 05/15/21 0449  ALBUMIN 1.9* 1.9* 1.9* 2.0* 2.1*   No results for input(s): LIPASE, AMYLASE in the last 168 hours. No results for input(s): AMMONIA in the last 168 hours. Coagulation Profile: No results for input(s): INR, PROTIME in the last 168 hours. Cardiac Enzymes: No results for input(s): CKTOTAL, CKMB, CKMBINDEX, TROPONINI in the last 168 hours. BNP (last 3 results) No results for input(s): PROBNP in the last 8760 hours. HbA1C: No results for input(s): HGBA1C in the last 72 hours. CBG: Recent Labs  Lab 05/14/21 1201 05/14/21 1632 05/14/21 2053 05/15/21 0041 05/15/21 0828  GLUCAP 206* 203* 196* 269* 220*   Lipid Profile: No results for input(s): CHOL, HDL, LDLCALC, TRIG, CHOLHDL, LDLDIRECT in the last 72 hours. Thyroid Function Tests: No results for input(s): TSH, T4TOTAL, FREET4, T3FREE, THYROIDAB in the last 72 hours. Anemia Panel: No results for input(s): VITAMINB12, FOLATE, FERRITIN, TIBC, IRON, RETICCTPCT in the last 72 hours.    Radiology Studies: I have reviewed all of the imaging during this hospital visit personally     Scheduled Meds:  acetaminophen  650 mg Oral TID   aspirin EC  81 mg Oral Daily   atorvastatin  80 mg Oral QHS   calcitRIOL  0.25 mcg Oral Daily   carvedilol  25 mg Oral BID WC    Chlorhexidine Gluconate Cloth  6 each Topical Daily   cloNIDine  0.1 mg Oral TID   clopidogrel  75 mg Oral Daily   dorzolamide-timolol  1 drop Both Eyes BID   epoetin (EPOGEN/PROCRIT) injection  3,000 Units Subcutaneous Q14 Days   escitalopram  5 mg Oral Daily   ezetimibe  10 mg  Oral Daily   feeding supplement (GLUCERNA SHAKE)  237 mL Oral BID BM   fluticasone furoate-vilanterol  1 puff Inhalation Daily   And   umeclidinium bromide  1 puff Inhalation Daily   furosemide  80 mg Oral BID   gabapentin  100 mg Oral BID   hydrALAZINE  25 mg Oral TID   insulin aspart  0-20 Units Subcutaneous TID WC   insulin aspart  0-5 Units Subcutaneous QHS   insulin aspart  3 Units Subcutaneous TID WC   insulin detemir  30 Units Subcutaneous QHS   isosorbide mononitrate  120 mg Oral Daily   levothyroxine  50 mcg Oral Q0600   montelukast  10 mg Oral Daily   pantoprazole  40 mg Oral Daily   sodium chloride flush  3 mL Intravenous Q12H   tamsulosin  0.4 mg Oral QHS   cyanocobalamin  1,000 mcg Oral Daily   Continuous Infusions:  sodium chloride 10 mL/hr at 05/13/21 1614     LOS: 12 days        Makalah Asberry Annett Gula, MD

## 2021-05-15 NOTE — Progress Notes (Signed)
Urinary catheter irrigated with 36ml sterile NS per orders, small clot dislodged and pt reports "feeling better" after this occurred. Urine was very dark red with some small clots but after irrigating the urine became pink/reddish and clearer.

## 2021-05-15 NOTE — Progress Notes (Signed)
[facility]  PROGRESS NOTE   Subjective:   Denies any chest pain or shortness of breath Urine output has been bloody, patient is being followed by urology.  Objective:  Vital signs in last 24 hours:  Temp:  [98 F (36.7 C)-99.3 F (37.4 C)] 99.3 F (37.4 C) (11/13 1145) Pulse Rate:  [53-65] 60 (11/13 1145) Resp:  [16-18] 18 (11/13 1145) BP: (134-165)/(52-76) 134/52 (11/13 1145) SpO2:  [94 %-99 %] 94 % (11/13 1145)  Weight change:  Filed Weights   05/08/21 0500 05/10/21 0310 05/11/21 1021  Weight: 108.2 kg 117.5 kg 110.3 kg    Intake/Output: I/O last 3 completed shifts: In: -  Out: 2525 [Urine:2525]   Intake/Output this shift:  Total I/O In: 120 [P.O.:120] Out: -   Physical Exam: General:  No acute distress  Head:  Normocephalic, atraumatic. Moist oral mucosal membranes  Eyes:  Anicteric  Neck:  Supple  Lungs:   Clear to auscultation, normal effort  Heart:  S1S2 no rubs  Abdomen:   Soft, nontender, bowel sounds present  Extremities:  peripheral edema.  Neurologic:  Awake, alert, following commands  Skin:  No lesions  Access:     Basic Metabolic Panel: Recent Labs  Lab 05/11/21 0422 05/12/21 0428 05/12/21 1017 05/13/21 0524 05/14/21 0601 05/15/21 0449  NA 137 136  --  137  139 140 137  K 3.5 3.2*  --  3.5  3.6 3.4* 3.5  CL 96* 94*  --  94*  96* 93* 92*  CO2 33* 34*  --  34*  35* 37* 36*  GLUCOSE 215* 162*  --  193*  192* 201* 253*  BUN 85* 86*  --  82*  90* 93* 88*  CREATININE 3.64* 3.25*  --  3.19*  3.29* 3.20* 3.25*  CALCIUM 8.0* 7.7*  --  8.0*  8.1* 8.4* 8.5*  MG  --   --  2.0 2.3 2.1  --   PHOS 5.5* 4.8*  --  3.4  3.7 3.9  4.1 3.6    CBC: Recent Labs  Lab 05/10/21 0501 05/11/21 0422 05/12/21 0429 05/13/21 0524 05/14/21 0601  WBC 4.3 3.9* 4.3 4.6 4.1  NEUTROABS 3.0 2.6 2.7  --   --   HGB 8.4* 8.4* 8.5* 8.5* 7.9*  HCT 27.2* 27.6* 27.7* 27.3* 25.8*  MCV 88.0 88.7 89.6 88.9 89.0  PLT 259 246 266 263 274      Urinalysis: No results for input(s): COLORURINE, LABSPEC, PHURINE, GLUCOSEU, HGBUR, BILIRUBINUR, KETONESUR, PROTEINUR, UROBILINOGEN, NITRITE, LEUKOCYTESUR in the last 72 hours.  Invalid input(s): APPERANCEUR    Imaging: No results found.   Medications:    sodium chloride 10 mL/hr at 05/13/21 1614    acetaminophen  650 mg Oral TID   aspirin EC  81 mg Oral Daily   atorvastatin  80 mg Oral QHS   calcitRIOL  0.25 mcg Oral Daily   carvedilol  25 mg Oral BID WC   Chlorhexidine Gluconate Cloth  6 each Topical Daily   cloNIDine  0.1 mg Oral TID   clopidogrel  75 mg Oral Daily   dorzolamide-timolol  1 drop Both Eyes BID   epoetin (EPOGEN/PROCRIT) injection  3,000 Units Subcutaneous Q14 Days   escitalopram  5 mg Oral Daily   ezetimibe  10 mg Oral Daily   feeding supplement (GLUCERNA SHAKE)  237 mL Oral BID BM   fluticasone furoate-vilanterol  1 puff Inhalation Daily   And   umeclidinium bromide  1 puff Inhalation Daily  furosemide  80 mg Oral BID   gabapentin  100 mg Oral BID   hydrALAZINE  25 mg Oral TID   insulin aspart  0-20 Units Subcutaneous TID WC   insulin aspart  0-5 Units Subcutaneous QHS   insulin aspart  3 Units Subcutaneous TID WC   insulin detemir  30 Units Subcutaneous QHS   isosorbide mononitrate  120 mg Oral Daily   levothyroxine  50 mcg Oral Q0600   montelukast  10 mg Oral Daily   pantoprazole  40 mg Oral Daily   sodium chloride flush  3 mL Intravenous Q12H   tamsulosin  0.4 mg Oral QHS   cyanocobalamin  1,000 mcg Oral Daily    Assessment/ Plan:     Principal Problem:   Acute on chronic diastolic CHF (congestive heart failure) (HCC) Active Problems:   Elevated troponin   Unstable angina (HCC)   Pulmonary hypertension (HCC)   Acute on chronic respiratory failure with hypoxia and hypercapnia (HCC)   CKD (chronic kidney disease), stage IV (HCC)   Interstitial lung disease (HCC)   B12 deficiency   Pressure injury of skin   Moderate major  depression, single episode (HCC)   Atypical chest pain   SOB (shortness of breath)  75 year old male with history of hypertension, coronary artery disease, congestive heart failure, diabetes, peripheral vascular disease, CKD stage IV, anemia now admitted with history of fluid overload and COPD exacerbation.   #1: Urinary retention/hematuria.  Patient is being followed by urology.  Had irrigation of the Foley catheter last night.  Patient continued to have hematuria.   #2: Congestive heart failure: We will maintain on 1000 cc fluid restriction.  Continue the Lasix at 80 mg twice a day.  May also need metolazone.   #3: Acute kidney injury on the top of chronic kidney disease: Worsening renal indicis possibly secondary to decreased cardiac output.  Maintain on Lasix.  May need renal replacement therapy in the near future.   #4: Anemia: Continue to monitor hemoglobin levels.  May need blood transfusion.  Patient is on erythropoietin and iron supplementation.   Spoke to the family at bedside.  Will monitor closely.   LOS: [place], [facility] 11/13/20222:26 PM

## 2021-05-16 ENCOUNTER — Ambulatory Visit: Payer: Medicare HMO | Admitting: Family

## 2021-05-16 LAB — MAGNESIUM: Magnesium: 2.3 mg/dL (ref 1.7–2.4)

## 2021-05-16 LAB — URINALYSIS, ROUTINE W REFLEX MICROSCOPIC
Bacteria, UA: NONE SEEN
Bilirubin Urine: NEGATIVE
Glucose, UA: NEGATIVE mg/dL
Ketones, ur: NEGATIVE mg/dL
Nitrite: NEGATIVE
Protein, ur: NEGATIVE mg/dL
RBC / HPF: >50 RBC/hpf — ABNORMAL HIGH (ref 0–5)
Specific Gravity, Urine: 1.005 (ref 1.005–1.030)
Squamous Epithelial / HPF: NONE SEEN (ref 0–5)
pH: 5 (ref 5.0–8.0)

## 2021-05-16 LAB — GLUCOSE, CAPILLARY
Glucose-Capillary: 145 mg/dL — ABNORMAL HIGH (ref 70–99)
Glucose-Capillary: 156 mg/dL — ABNORMAL HIGH (ref 70–99)
Glucose-Capillary: 133 mg/dL — ABNORMAL HIGH (ref 70–99)
Glucose-Capillary: 265 mg/dL — ABNORMAL HIGH (ref 70–99)

## 2021-05-16 LAB — BASIC METABOLIC PANEL
Anion gap: 8 (ref 5–15)
BUN: 84 mg/dL — ABNORMAL HIGH (ref 8–23)
CO2: 37 mmol/L — ABNORMAL HIGH (ref 22–32)
Calcium: 8.3 mg/dL — ABNORMAL LOW (ref 8.9–10.3)
Chloride: 91 mmol/L — ABNORMAL LOW (ref 98–111)
Creatinine, Ser: 3.93 mg/dL — ABNORMAL HIGH (ref 0.61–1.24)
GFR, Estimated: 15 mL/min — ABNORMAL LOW (ref >60)
Glucose, Bld: 143 mg/dL — ABNORMAL HIGH (ref 70–99)
Potassium: 3.6 mmol/L (ref 3.5–5.1)
Sodium: 136 mmol/L (ref 135–145)

## 2021-05-16 MED ORDER — ACETAZOLAMIDE 250 MG PO TABS
250.0000 mg | ORAL_TABLET | Freq: Two times a day (BID) | ORAL | Status: DC
  Administered 2021-05-16 (×2): 250 mg via ORAL
  Filled 2021-05-16 (×3): qty 1

## 2021-05-16 MED ORDER — ENSURE ENLIVE PO LIQD
237.0000 mL | Freq: Three times a day (TID) | ORAL | Status: DC
  Administered 2021-05-16 – 2021-05-19 (×7): 237 mL via ORAL

## 2021-05-16 MED ORDER — ADULT MULTIVITAMIN W/MINERALS CH
1.0000 | ORAL_TABLET | Freq: Every day | ORAL | Status: DC
  Administered 2021-05-16 – 2021-05-19 (×4): 1 via ORAL
  Filled 2021-05-16 (×4): qty 1

## 2021-05-16 MED ORDER — COSYNTROPIN 0.25 MG IJ SOLR
0.2500 mg | Freq: Once | INTRAMUSCULAR | Status: AC
  Administered 2021-05-17: 0.25 mg via INTRAVENOUS
  Filled 2021-05-16: qty 0.25

## 2021-05-16 MED ORDER — SODIUM CHLORIDE 0.9 % IV SOLN
500.0000 mg | Freq: Two times a day (BID) | INTRAVENOUS | Status: DC
  Administered 2021-05-16 – 2021-05-18 (×5): 500 mg via INTRAVENOUS
  Filled 2021-05-16 (×2): qty 0.5
  Filled 2021-05-16 (×3): qty 500
  Filled 2021-05-16: qty 0.5

## 2021-05-16 NOTE — Care Management Important Message (Signed)
Important Message  Patient Details  Name: Stephen [name] MRN: [id number] Date of Birth: March 21, 1946   Medicare Important Message Given:  Yes     Juliann Pulse A Elva Mauro 05/16/2021, 3:03 PM

## 2021-05-16 NOTE — Progress Notes (Signed)
Cross Cover Nurse reports patient with fever and somnolence.  WBC now up to 12 Procalcitonin 0.15 down from 0.22 Abg with significant metabolic alkalosis chest xray  hypoventilation and parapneumonic effusion Prior CT chest abd pelvis shows possible adrenal adenoma with previous AM cortisol level low at 6urinalysis   BIPAP Changed lasix to diamox Cortisol stim test ordered Start meropenem - consider CT and IR intervention for effusion if indicated

## 2021-05-16 NOTE — Progress Notes (Signed)
Physical Therapy Treatment Patient Details Name: Stephen [name] MRN: [id number] DOB: [birth year] Today's Date: 05/16/2021   History of Present Illness Stephen [name] is a 75 y.o. male with medical history significant for , interstitial lung disease, pulmonary hypertension, OSA with chronic respiratory failure on home O2 at 4L and Bipap, followed by pulmonology, as well as diastolic heart failure, CAD, DM, CKD IV, recently hospitalized from 10/16-10/29 with NSTEMI treated with heparin infusion, and respiratory failure secondary to CHF and COPD exacerbation who returns to the ED 2 days later with complaints of chest pain and shortness of breath. Chest pain was severe heaviness on his chest.EMS reports sats of 91% on his normal 4 L.  They attempted to increase it to 6 L with minimal improvement to 93% and and then put him on a nonrebreather and sats were 100%.  Wife contributes to history .    PT Comments    Pt in bed upon entry, family in room. Pt noted to be soiled, author assists with cleanup, ultimately requires several minutes and a 2nd staff member (RN), pt assisted with rolling in bed, author gives tactile and verbal cues for posturing. Pt requires support of limbs and head at times due to fatigue. Pt is unable to pull self up as needed without +2 total assist. Pt follows cues well, but is severely limited by weakness. Author attempted to return later in day, but RN reports pt's sats not sufficient, requiring return to BiPAP earlier.     Recommendations for follow up therapy are one component of a multi-disciplinary discharge planning process, led by the attending physician.  Recommendations may be updated based on patient status, additional functional criteria and insurance authorization.  Follow Up Recommendations  Skilled nursing-short term rehab (<3 hours/day)     Assistance Recommended at Discharge Frequent or constant Supervision/Assistance  Equipment Recommendations  Other  (comment) (defer to receiving facility)    Recommendations for Other Services       Precautions / Restrictions Precautions Precautions: Fall Restrictions Weight Bearing Restrictions: No     Mobility  Bed Mobility Overal bed mobility: Needs Assistance Bed Mobility: Rolling (scooting up) Rolling: +2 for physical assistance;Max assist         General bed mobility comments: Pt found soiled in bed, assisted with pericare cleanup, required MaxA +2 for rolling in bed and pulling up. Pt fatigues quickly.    Transfers                        Ambulation/Gait                   Stairs             Wheelchair Mobility    Modified Rankin (Stroke Patients Only)       Balance                                            Cognition Arousal/Alertness: Awake/alert Behavior During Therapy: Flat affect;Anxious;WFL for tasks assessed/performed Overall Cognitive Status: Difficult to assess                                          Exercises      General Comments  Pertinent Vitals/Pain Pain Assessment: No/denies pain    Home Living                          Prior Function            PT Goals (current goals can now be found in the care plan section) Acute Rehab PT Goals Patient Stated Goal: none stated PT Goal Formulation: With patient/family Time For Goal Achievement: 05/18/21 Potential to Achieve Goals: Poor Progress towards PT goals: Not progressing toward goals - comment    Frequency    Min 2X/week      PT Plan Current plan remains appropriate    Co-evaluation              AM-PAC PT "6 Clicks" Mobility   Outcome Measure  Help needed turning from your back to your side while in a flat bed without using bedrails?: Total Help needed moving from lying on your back to sitting on the side of a flat bed without using bedrails?: Total Help needed moving to and from a bed to a chair  (including a wheelchair)?: Total Help needed standing up from a chair using your arms (e.g., wheelchair or bedside chair)?: Total Help needed to walk in hospital room?: Total Help needed climbing 3-5 steps with a railing? : Total 6 Click Score: 6    End of Session Equipment Utilized During Treatment: Oxygen Activity Tolerance: No increased pain;Patient limited by fatigue Patient left: in bed;with nursing/sitter in room;with family/visitor present Nurse Communication: Mobility status PT Visit Diagnosis: Other abnormalities of gait and mobility (R26.89);Muscle weakness (generalized) (M62.81);Difficulty in walking, not elsewhere classified (R26.2)     Time: 1000-1017 PT Time Calculation (min) (ACUTE ONLY): 17 min  Charges:  $Therapeutic Activity: 8-22 mins                    4:59 PM, 05/16/21 Stephen [name], PT, DPT Physical Therapist - [facility]  [phone] (ASCOM)    Stephen [name] 05/16/2021, 4:55 PM

## 2021-05-16 NOTE — Progress Notes (Signed)
Pharmacy Antibiotic Note  Stephen [name] is a 75 y.o. male admitted on 04/26/2021 with  parapneumonic effusion .  Pharmacy has been consulted for meropenem dosing.  Plan:  Meropenem 500 mg IV Q12H ordered to start on 11/14 @ 0230.   Height: 5\' 5"  (165.1 cm) Weight: 110.3 kg (243 lb 1.6 oz) IBW/kg (Calculated) : 61.5  Temp (24hrs), Avg:99.2 F (37.3 C), Min:98 F (36.7 C), Max:100.7 F (38.2 C)  Recent Labs  Lab 05/11/21 0422 05/12/21 0428 05/12/21 0429 05/13/21 0524 05/14/21 0601 05/15/21 0449 05/15/21 2055  WBC 3.9*  --  4.3 4.6 4.1  --  12.4*  CREATININE 3.64* 3.25*  --  3.19*  3.29* 3.20* 3.25*  --     Estimated Creatinine Clearance: 23.2 mL/min (A) (by C-G formula based on SCr of 3.25 mg/dL (H)).    Allergies  Allergen Reactions   Calcium Channel Blockers     Would avoid if possible due to h/o peripheral edema    Antimicrobials this admission:   >>    >>   Dose adjustments this admission:   Microbiology results:  BCx:   UCx:    Sputum:    MRSA PCR:   Thank you for allowing pharmacy to be a part of this patient's care.  Katera Rybka D 05/16/2021 2:10 AM

## 2021-05-16 NOTE — Progress Notes (Signed)
[facility]  PROGRESS NOTE   Subjective:   Daughter at bedside this morning.   Tmax 100.7  UOP 2464mL.   Creatinine 3.93 (3.25)  Acetazolamide started. Furosemide on hold.   Objective:  Vital signs in last 24 hours:  Temp:  [97.9 F (36.6 C)-100.7 F (38.2 C)] 97.9 F (36.6 C) (11/14 1427) Pulse Rate:  [56-68] 56 (11/14 1427) Resp:  [16-18] 18 (11/14 1427) BP: (115-142)/(47-63) 115/50 (11/14 1427) SpO2:  [94 %-100 %] 100 % (11/14 1427)  Weight change:  Filed Weights   05/08/21 0500 05/10/21 0310 05/11/21 1021  Weight: 108.2 kg 117.5 kg 110.3 kg    Intake/Output: I/O last 3 completed shifts: In: 300 [P.O.:120; Other:180] Out: 2450 [Urine:2450]   Intake/Output this shift:  Total I/O In: -  Out: 500 [Urine:500]  Physical Exam: General:  No acute distress  Head:  Normocephalic, atraumatic. Moist oral mucosal membranes  Eyes:  Anicteric  Neck:  Supple  Lungs:   Clear to auscultation, normal effort  Heart:  regular  Abdomen:   Soft, nontender, bowel sounds present  Extremities:  peripheral edema.  Neurologic:  Awake, alert, following commands  Skin:  No lesions  GU Foley catheter with red colored urine.     Basic Metabolic Panel: Recent Labs  Lab 05/11/21 0422 05/12/21 0428 05/12/21 1017 05/13/21 0524 05/14/21 0601 05/15/21 0449 05/16/21 0516  NA 137 136  --  137  139 140 137 136  K 3.5 3.2*  --  3.5  3.6 3.4* 3.5 3.6  CL 96* 94*  --  94*  96* 93* 92* 91*  CO2 33* 34*  --  34*  35* 37* 36* 37*  GLUCOSE 215* 162*  --  193*  192* 201* 253* 143*  BUN 85* 86*  --  82*  90* 93* 88* 84*  CREATININE 3.64* 3.25*  --  3.19*  3.29* 3.20* 3.25* 3.93*  CALCIUM 8.0* 7.7*  --  8.0*  8.1* 8.4* 8.5* 8.3*  MG  --   --  2.0 2.3 2.1  --  2.3  PHOS 5.5* 4.8*  --  3.4  3.7 3.9  4.1 3.6  --      CBC: Recent Labs  Lab 05/10/21 0501 05/11/21 0422 05/12/21 0429 05/13/21 0524 05/14/21 0601 05/15/21 2055  WBC 4.3 3.9* 4.3 4.6 4.1 12.4*   NEUTROABS 3.0 2.6 2.7  --   --  10.6*  HGB 8.4* 8.4* 8.5* 8.5* 7.9* 8.4*  HCT 27.2* 27.6* 27.7* 27.3* 25.8* 27.4*  MCV 88.0 88.7 89.6 88.9 89.0 87.8  PLT 259 246 266 263 274 312      Urinalysis: Recent Labs    05/16/21 0124  COLORURINE PINK*  LABSPEC 1.005  PHURINE 5.0  GLUCOSEU NEGATIVE  HGBUR MODERATE*  BILIRUBINUR NEGATIVE  KETONESUR NEGATIVE  PROTEINUR NEGATIVE  NITRITE NEGATIVE  LEUKOCYTESUR TRACE*       Imaging: DG Chest Port 1 View  Result Date: 05/15/2021 CLINICAL DATA:  Acute respiratory failure.  Hypoxia. EXAM: PORTABLE CHEST 1 VIEW COMPARISON:  05/08/2021 FINDINGS: Lung volumes are small and pulmonary insufflation has diminished since prior examination. Patchy infiltrate within the left mid and lower lung zone is again noted, possibly infectious or inflammatory in nature. Small left pleural effusion persists, unchanged. No pneumothorax. No pleural effusion on the right. Cardiac size within normal limits. No acute bone abnormality. Degenerative changes noted within the right shoulder. IMPRESSION: Progressive pulmonary hypoinflation. Stable left basilar pulmonary infiltrate and small left pleural effusion, possibly reflecting a  focal pneumonic infiltrate and associated parapneumonic effusion. Electronically Signed   By: Fidela Salisbury M.D.   On: 05/15/2021 20:45     Medications:    sodium chloride 10 mL/hr at 05/13/21 1614   meropenem (MERREM) IV 500 mg (05/16/21 1404)    acetaminophen  650 mg Oral TID   acetaZOLAMIDE  250 mg Oral BID   aspirin EC  81 mg Oral Daily   atorvastatin  80 mg Oral QHS   calcitRIOL  0.25 mcg Oral Daily   carvedilol  25 mg Oral BID WC   Chlorhexidine Gluconate Cloth  6 each Topical Daily   cloNIDine  0.1 mg Oral TID   clopidogrel  75 mg Oral Daily   [START ON 05/17/2021] cosyntropin  0.25 mg Intravenous Once   dorzolamide-timolol  1 drop Both Eyes BID   epoetin (EPOGEN/PROCRIT) injection  3,000 Units Subcutaneous Q14 Days    escitalopram  5 mg Oral Daily   ezetimibe  10 mg Oral Daily   feeding supplement  237 mL Oral TID BM   fluticasone furoate-vilanterol  1 puff Inhalation Daily   And   umeclidinium bromide  1 puff Inhalation Daily   gabapentin  100 mg Oral BID   hydrALAZINE  25 mg Oral TID   insulin aspart  0-20 Units Subcutaneous TID WC   insulin aspart  0-5 Units Subcutaneous QHS   insulin aspart  3 Units Subcutaneous TID WC   insulin detemir  30 Units Subcutaneous QHS   isosorbide mononitrate  120 mg Oral Daily   levothyroxine  50 mcg Oral Q0600   montelukast  10 mg Oral Daily   multivitamin with minerals  1 tablet Oral Daily   pantoprazole  40 mg Oral Daily   sodium chloride flush  3 mL Intravenous Q12H   tamsulosin  0.4 mg Oral QHS   cyanocobalamin  1,000 mcg Oral Daily    Assessment/ Plan:     Principal Problem:   Acute on chronic diastolic CHF (congestive heart failure) (HCC) Active Problems:   Elevated troponin   Unstable angina (HCC)   Pulmonary hypertension (HCC)   Acute on chronic respiratory failure with hypoxia and hypercapnia (HCC)   CKD (chronic kidney disease), stage IV (HCC)   Interstitial lung disease (HCC)   B12 deficiency   Pressure injury of skin   Moderate major depression, single episode (HCC)   Atypical chest pain   SOB (shortness of breath)  Stephen [name] is a 75 y.o. white male with hypertension, coronary artery disease, congestive heart failure, diabetes mellitus type II, peripheral vascular disease, COPD who is admitted to [facility] on 04/06/2021 for Unstable angina (HCC) [I20.0] CHF (congestive heart failure) ([place]) [I50.9] Pain [R52] Atypical chest pain [R07.89] NSTEMI (non-ST elevated myocardial infarction) ([place]) [I21.4]  Acute kidney injury on chronic kidney disease stage IV baseline creatinine of 2.92 GFR of 22 on 02/24/21. Acute kidney injury secondary to obstructive uropathy. Followed by Dr. Holley Raring as outpatient.  No acute indication for dialysis.    Hypertension and acute exacerbation of chronic diastolic congestive heart failure. Currently holding furosemide. Started on acetazolamide for contraction alkalosis. Home regimen of clonidine, torsemide, carvedilol, isosorbide mononitrate and tamsulosin.   Anemia with chronic kidney disease: hemoglobin 8.4.  Continue to monitor.   Hematuria: Urology on 11/12 determined that hematuria was due to foley catheterization. Patient underwent manual irrigations.   Secondary Hyperparathyroidism: PTH 182 on 02/08/21. Calcium and phosphorus at goal on most recent labs.  - calcitriol   LOS:  [place] 11/14/20223:27 PM

## 2021-05-16 NOTE — Consult Note (Signed)
  Follow up for patient hospitalized with multiple medical problems. Patient was asleep and on Bi-pap machine. Wife was at bedside and reported that patient has a fever and they are checking for a UTI. Wife states she has not noticed much change in his mood, but since he has been feeling physically worse "it is hard to tell." Wife shows Conservator, museum/gallery of the family in the room in frames and speaks positively about her hopes that he will feel better. Informed patient's wife that we will continue to follow.

## 2021-05-16 NOTE — NC FL2 (Signed)
[place] LEVEL OF CARE SCREENING TOOL     IDENTIFICATION  Patient Name: Stephen [name] Birthdate: [birth year] Sex: male Admission Date (Current Location): 05/01/2021  [place] and [place] Number:  Engineering geologist and Address:  [facility], [address] [place], [place] [address]      Provider Number: [id number]  Attending Physician Name and Address:  Kayleen Memos, DO  Relative Name and Phone Number:  [name] (spouse) [phone]    Current Level of Care: Hospital Recommended Level of Care: [place] Prior Approval Number:    Date Approved/Denied:   PASRR Number: [id number]  Discharge Plan: SNF    Current Diagnoses: Patient Active Problem List   Diagnosis Date Noted   Atypical chest pain    SOB (shortness of breath)    Moderate major depression, single episode ([place]) 05/12/2021   Pressure injury of skin 05/05/2021   Thrombophlebitis of superficial veins of left lower extremity    Superficial thrombophlebitis of left upper extremity    B12 deficiency    Generalized weakness    Acute respiratory failure with hypoxia and hypercapnia (HCC)    Acute metabolic encephalopathy    Acute on chronic respiratory failure with hypoxia and hypercapnia ([place]) 04/17/2021   CKD (chronic kidney disease), stage IV ([place])    Interstitial lung disease ([place])    COPD with acute exacerbation ([place])    Pounding heartbeat 03/09/2021   Bruising 11/29/2020   Cough 11/29/2020   Unstable angina ([place]) 08/19/2020   Pulmonary hypertension ([place]) 08/19/2020   Tremor 07/29/2020   Hypoxia 03/21/2020   Left leg cellulitis 09/16/2019   Asthma 03/31/2019   Renal artery stenosis ([place]) 02/24/2019   Advance care planning 12/26/2018   Hypothyroidism 12/26/2018   HLD (hyperlipidemia) 12/26/2018   Coronary artery disease involving native coronary artery of native heart with angina pectoris ([place]) 10/09/2018   Pulmonary hypertension, primary  ([place]) 09/26/2018   CHF (congestive heart failure) ([place]) 09/20/2018   Knee pain 03/26/2018   Encounter for chronic pain management 12/23/2017   Healthcare maintenance 09/21/2017   Skin lesion 06/15/2017   Acute kidney injury superimposed on CKD ([place]) 01/15/2017   Elevated troponin 01/15/2017   Coronary stent occlusion: Occluded circumflex stent 01/13/2017   Accelerated hypertension with diastolic congestive heart failure, NYHA class 3 ([place]); elevated LVEDP on chronic catheterization. 01/13/2017   NSTEMI (non-ST elevated myocardial infarction) ([place]) 01/11/2017   Blood in urine 12/08/2016   Lymphedema 10/23/2016   Low back pain with right-sided sciatica 04/03/2014   Muscle spasm 04/24/2013   Chronic edema 11/13/2012   Leg edema, right 09/29/2012   SK (seborrheic keratosis) 09/29/2012   Acute on chronic diastolic CHF (congestive heart failure) ([place]) 12/15/2011   Sebaceous cyst 11/28/2011   Obesity, Class III, BMI 40-49.9 (morbid obesity) ([place]) 07/27/2011   Hyperlipidemia with target low density lipoprotein (LDL) cholesterol less than 70 mg/dL 04/26/2011   Adrenal adenoma 03/02/2011   Back pain 12/06/2010   Kidney stones 12/06/2010   Essential hypertension, benign 10/28/2010   PULMONARY FIBROSIS 09/15/2010   CAD (coronary artery disease) 09/01/2010   Type 2 diabetes mellitus with renal complication ([place]) 76/19/5093   METABOLIC SYNDROME X 26/71/2458   OSA (obstructive sleep apnea) 08/31/2010    Orientation RESPIRATION BLADDER Height & Weight     Self, Time, Situation, Place  O2 (4L nasal cannula) Incontinent, External catheter Weight: 243 lb 1.6 oz (110.3 kg) Height:  5\' 5"  (165.1 cm)  BEHAVIORAL SYMPTOMS/MOOD NEUROLOGICAL BOWEL NUTRITION  STATUS      Incontinent Diet (see discharge summary)  AMBULATORY STATUS COMMUNICATION OF NEEDS Skin   Extensive Assist Verbally Other (Comment) (stage 2 pressure injury buttocks, ecchymosis arm)                       Personal Care  Assistance Level of Assistance  Bathing, Dressing, Total care, Feeding Bathing Assistance: Maximum assistance Feeding assistance: Independent Dressing Assistance: Maximum assistance Total Care Assistance: Maximum assistance   Functional Limitations Info  Sight, Hearing, Speech Sight Info: Impaired Hearing Info: Impaired Speech Info: Adequate    SPECIAL CARE FACTORS FREQUENCY  PT (By licensed PT), OT (By licensed OT)     PT Frequency: min 3x weekly OT Frequency: min 3x weekly            Contractures Contractures Info: Not present    Additional Factors Info  Code Status, Allergies Code Status Info: DNR Allergies Info: calcium channel blockers           Current Medications (05/16/2021):  This is the current hospital active medication list Current Facility-Administered Medications  Medication Dose Route Frequency Provider Last Rate Last Admin   0.9 %  sodium chloride infusion   Intravenous PRN Sharion Settler, NP 10 mL/hr at 05/13/21 1614 Infusion Verify at 05/13/21 1614   acetaminophen (TYLENOL) tablet 650 mg  650 mg Oral TID Earlie Counts, NP   650 mg at 05/15/21 2133   acetaZOLAMIDE (DIAMOX) tablet 250 mg  250 mg Oral BID Sharion Settler, NP       albuterol (PROVENTIL) (2.5 MG/3ML) 0.083% nebulizer solution 2.5 mg  2.5 mg Inhalation Q6H PRN Loletha Grayer, MD   2.5 mg at 05/08/21 2158   ALPRAZolam Duanne Moron) tablet 0.5 mg  0.5 mg Oral TID PRN Shelly Coss, MD   0.5 mg at 05/16/21 0303   aspirin EC tablet 81 mg  81 mg Oral Daily Athena Masse, MD   81 mg at 05/15/21 0840   atorvastatin (LIPITOR) tablet 80 mg  80 mg Oral QHS Athena Masse, MD   80 mg at 05/15/21 2133   calcitRIOL (ROCALTROL) capsule 0.25 mcg  0.25 mcg Oral Daily Loletha Grayer, MD   0.25 mcg at 05/15/21 0840   carvedilol (COREG) tablet 25 mg  25 mg Oral BID WC Athena Masse, MD   25 mg at 05/15/21 1526   Chlorhexidine Gluconate Cloth 2 % PADS 6 each  6 each Topical Daily Cristal Deer, MD    6 each at 05/15/21 1126   cloNIDine (CATAPRES) tablet 0.1 mg  0.1 mg Oral TID End, Christopher, MD   0.1 mg at 05/15/21 2133   clopidogrel (PLAVIX) tablet 75 mg  75 mg Oral Daily Loletha Grayer, MD   75 mg at 05/15/21 0840   [START ON 05/17/2021] cosyntropin (CORTROSYN) injection 0.25 mg  0.25 mg Intravenous Once Sharion Settler, NP       dorzolamide-timolol (COSOPT) 22.3-6.8 MG/ML ophthalmic solution 1 drop  1 drop Both Eyes BID Loletha Grayer, MD   1 drop at 05/15/21 2134   epoetin alfa (EPOGEN) injection 3,000 Units  3,000 Units Subcutaneous Q14 Days Liana Gerold, MD   3,000 Units at 05/09/21 3419   escitalopram (LEXAPRO) tablet 5 mg  5 mg Oral Daily Clapacs, Madie Reno, MD   5 mg at 05/15/21 0840   ezetimibe (ZETIA) tablet 10 mg  10 mg Oral Daily Loletha Grayer, MD   10 mg at 05/15/21 0840  feeding supplement (GLUCERNA SHAKE) (GLUCERNA SHAKE) liquid 237 mL  237 mL Oral BID BM Arrien, Jimmy Picket, MD   237 mL at 05/15/21 1245   fluticasone furoate-vilanterol (BREO ELLIPTA) 200-25 MCG/ACT 1 puff  1 puff Inhalation Daily Loletha Grayer, MD   1 puff at 05/15/21 0847   And   umeclidinium bromide (INCRUSE ELLIPTA) 62.5 MCG/ACT 1 puff  1 puff Inhalation Daily Loletha Grayer, MD   1 puff at 05/15/21 0847   gabapentin (NEURONTIN) capsule 100 mg  100 mg Oral BID Loletha Grayer, MD   100 mg at 05/15/21 2133   hydrALAZINE (APRESOLINE) tablet 25 mg  25 mg Oral TID Minna Merritts, MD   25 mg at 05/15/21 2133   HYDROcodone-acetaminophen (NORCO/VICODIN) 5-325 MG per tablet 1 tablet  1 tablet Oral Q6H PRN Shelly Coss, MD   1 tablet at 05/14/21 1204   insulin aspart (novoLOG) injection 0-20 Units  0-20 Units Subcutaneous TID WC Athena Masse, MD   11 Units at 05/15/21 1729   insulin aspart (novoLOG) injection 0-5 Units  0-5 Units Subcutaneous QHS Athena Masse, MD   2 Units at 05/13/21 2238   insulin aspart (novoLOG) injection 3 Units  3 Units Subcutaneous TID WC Val Riles, MD    3 Units at 05/15/21 1729   insulin detemir (LEVEMIR) injection 30 Units  30 Units Subcutaneous QHS Loletha Grayer, MD   30 Units at 05/15/21 2133   isosorbide mononitrate (IMDUR) 24 hr tablet 120 mg  120 mg Oral Daily Judd Gaudier V, MD   120 mg at 05/15/21 0840   levothyroxine (SYNTHROID) tablet 50 mcg  50 mcg Oral Q0600 Athena Masse, MD   50 mcg at 05/16/21 0541   meropenem (MERREM) 500 mg in sodium chloride 0.9 % 100 mL IVPB  500 mg Intravenous Q12H Tawni Millers, MD 200 mL/hr at 05/16/21 0246 500 mg at 05/16/21 0246   montelukast (SINGULAIR) tablet 10 mg  10 mg Oral Daily Loletha Grayer, MD   10 mg at 05/15/21 0843   nitroGLYCERIN (NITROSTAT) SL tablet 0.4 mg  0.4 mg Sublingual Q5 min PRN Loletha Grayer, MD       ondansetron [facility]) injection 4 mg  4 mg Intravenous Q6H PRN Athena Masse, MD   4 mg at 05/15/21 0808   pantoprazole (PROTONIX) EC tablet 40 mg  40 mg Oral Daily Loletha Grayer, MD   40 mg at 05/15/21 0839   polyethylene glycol (MIRALAX / GLYCOLAX) packet 17 g  17 g Oral Daily PRN Loletha Grayer, MD   17 g at 05/06/21 2101   sodium chloride flush (NS) 0.9 % injection 3 mL  3 mL Intravenous Q12H Shawna Clamp, MD   3 mL at 05/15/21 2134   tamsulosin (FLOMAX) capsule 0.4 mg  0.4 mg Oral QHS Loletha Grayer, MD   0.4 mg at 05/15/21 2133   vitamin B-12 (CYANOCOBALAMIN) tablet 1,000 mcg  1,000 mcg Oral Daily Loletha Grayer, MD   1,000 mcg at 05/15/21 5573     Discharge Medications: Please see discharge summary for a list of discharge medications.  Relevant Imaging Results:  Relevant Lab Results:   Additional Information SSN: [id number]  Alberteen Sam, LCSW

## 2021-05-16 NOTE — TOC Initial Note (Addendum)
Transition of Care [facility]) - Initial/Assessment Note    Patient Details  Name: Stephen [name] MRN: [id number] Date of Birth: 03-11-1946  Transition [facility]) CM/SW Contact:    Alberteen Sam, LCSW Phone Number: 05/16/2021, 9:00 AM  Clinical Narrative:                  CSW notes change in recommendation for discharge planning from [facility] to SNF, with patient and spouse preference for [facility].   CSW has sent referrals pending bed offer at this time.   CSW notes patient agreeable to outpatient palliative services to follow.   Expected Discharge Plan: Skilled Nursing Facility Barriers to Discharge: Continued Medical Work up   Patient Goals and CMS Choice   CMS Medicare.gov Compare Post Acute Care list provided to:: Patient Represenative (must comment) (spouse) Choice offered to / list presented to : Spouse  Expected Discharge Plan and Services Expected Discharge Plan: [place] Acute Care Choice: [place] Living arrangements for the past 2 months: Single Family Home                                      Prior Living Arrangements/Services Living arrangements for the past 2 months: Single Family Home Lives with:: Spouse                   Activities of Daily Living Home Assistive Devices/Equipment: Bedside commode/3-in-1, Environmental consultant (specify type) ADL Screening (condition at time of admission) Patient's cognitive ability adequate to safely complete daily activities?: Yes Is the patient deaf or have difficulty hearing?: No Does the patient have difficulty seeing, even when wearing glasses/contacts?: No Does the patient have difficulty concentrating, remembering, or making decisions?: No Patient able to express need for assistance with ADLs?: Yes Does the patient have difficulty dressing or bathing?: Yes Independently performs ADLs?: No Communication: Independent Dressing (OT): Needs assistance Is this a change from  baseline?: Pre-admission baseline Grooming: Needs assistance Is this a change from baseline?: Pre-admission baseline Feeding: Independent Bathing: Needs assistance Is this a change from baseline?: Pre-admission baseline Toileting: Needs assistance Is this a change from baseline?: Pre-admission baseline In/Out Bed: Needs assistance Is this a change from baseline?: Pre-admission baseline Walks in Home: Needs assistance Is this a change from baseline?: Pre-admission baseline Does the patient have difficulty walking or climbing stairs?: Yes Weakness of Legs: Both Weakness of Arms/Hands: None  Permission Sought/Granted                  Emotional Assessment         Alcohol / Substance Use: Not Applicable Psych Involvement: No (comment)  Admission diagnosis:  Unstable angina (HCC) [I20.0] CHF (congestive heart failure) (HCC) [I50.9] Pain [R52] Atypical chest pain [R07.89] NSTEMI (non-ST elevated myocardial infarction) ([place]) [I21.4] Patient Active Problem List   Diagnosis Date Noted   Atypical chest pain    SOB (shortness of breath)    Moderate major depression, single episode ([place]) 05/12/2021   Pressure injury of skin 05/05/2021   Thrombophlebitis of superficial veins of left lower extremity    Superficial thrombophlebitis of left upper extremity    B12 deficiency    Generalized weakness    Acute respiratory failure with hypoxia and hypercapnia (HCC)    Acute metabolic encephalopathy    Acute on chronic respiratory failure with hypoxia and hypercapnia ([place]) 04/17/2021   CKD (  chronic kidney disease), stage IV (HCC)    Interstitial lung disease ([place])    COPD with acute exacerbation ([place])    Pounding heartbeat 03/09/2021   Bruising 11/29/2020   Cough 11/29/2020   Unstable angina ([place]) 08/19/2020   Pulmonary hypertension ([place]) 08/19/2020   Tremor 07/29/2020   Hypoxia 03/21/2020   Left leg cellulitis 09/16/2019   Asthma 03/31/2019   Renal artery stenosis ([place])  02/24/2019   Advance care planning 12/26/2018   Hypothyroidism 12/26/2018   HLD (hyperlipidemia) 12/26/2018   Coronary artery disease involving native coronary artery of native heart with angina pectoris ([place]) 10/09/2018   Pulmonary hypertension, primary ([place]) 09/26/2018   CHF (congestive heart failure) ([place]) 09/20/2018   Knee pain 03/26/2018   Encounter for chronic pain management 12/23/2017   Healthcare maintenance 09/21/2017   Skin lesion 06/15/2017   Acute kidney injury superimposed on CKD ([place]) 01/15/2017   Elevated troponin 01/15/2017   Coronary stent occlusion: Occluded circumflex stent 01/13/2017   Accelerated hypertension with diastolic congestive heart failure, NYHA class 3 ([place]); elevated LVEDP on chronic catheterization. 01/13/2017   NSTEMI (non-ST elevated myocardial infarction) ([place]) 01/11/2017   Blood in urine 12/08/2016   Lymphedema 10/23/2016   Low back pain with right-sided sciatica 04/03/2014   Muscle spasm 04/24/2013   Chronic edema 11/13/2012   Leg edema, right 09/29/2012   SK (seborrheic keratosis) 09/29/2012   Acute on chronic diastolic CHF (congestive heart failure) ([place]) 12/15/2011   Sebaceous cyst 11/28/2011   Obesity, Class III, BMI 40-49.9 (morbid obesity) ([place]) 07/27/2011   Hyperlipidemia with target low density lipoprotein (LDL) cholesterol less than 70 mg/dL 04/26/2011   Adrenal adenoma 03/02/2011   Back pain 12/06/2010   Kidney stones 12/06/2010   Essential hypertension, benign 10/28/2010   PULMONARY FIBROSIS 09/15/2010   CAD (coronary artery disease) 09/01/2010   Type 2 diabetes mellitus with renal complication ([place]) 67/67/2094   METABOLIC SYNDROME X 70/96/2836   OSA (obstructive sleep apnea) 08/31/2010   PCP:  [name], MD Pharmacy:   [facility] [address] [place] [address] Phone: [phone] Fax: [phone]  [facility] [place], [place] [address] Phone: [phone] Fax: [phone]  [facility] - [place], [place] [address] Phone: [phone] Fax: [phone]     Social Determinants of Health (SDOH) Interventions    Readmission Risk Interventions Readmission Risk Prevention Plan 09/20/2018  Transportation Screening Complete  PCP or Specialist Appt within 3-5 Days Complete  HRI or [place] Patient refused  Social Work Consult for [place] Planning/Counseling Patient refused  Palliative Care Screening Not Applicable  Medication Review Press photographer) Complete  Some recent data might be hidden

## 2021-05-16 NOTE — Progress Notes (Addendum)
PROGRESS NOTE  Stephen [name] FKC:[id number] DOB: [birth year] DOA: 04/08/2021 PCP: [name], MD  HPI/Recap of past 24 hours: [name] wad admitted to the hospital with the working diagnosis of acute decompensated diastolic heart failure.   75 year old male past medical history for interstitial lung disease, pulmonary hypertension, obstructive sleep apnea, chronic hypoxic respiratory failure, coronary artery disease, type 2 diabetes mellitus, chronic kidney disease stage IV who presented with chest pain and dyspnea.  Recent hospitalization 10/16-2010/29 for non-STEMI and heart failure, 2 days after his discharge he had recurrent chest pain and dyspnea.  EMS was called and his oxygen saturation was 91% on 4 L, he was placed on a nonrebreather and transported to the hospital.  On his initial physical examination his oximetry was 98% on 6 L, his respiratory rate was 22, blood pressure 123/53, heart rate 59, temperature 98.4, his lungs had decreased breath sounds bilaterally, heart S1-S2, present, rhythmic, abdomen soft nontender, positive 3+ lower extremity edema.   Sodium 135, potassium 4.5, chloride 99, bicarb 28, glucose 241, BUN 72, creatinine 3.12, BNP 1205, high sensitive troponin 1417, 1514. white count 11.6, hemoglobin 9.7, hematocrit 39.8, platelets 332. SARS COVID-19 negative.   Urinalysis specific gravity 1.014, RBCs 0-5, white cells 0-5.   Chest radiograph with cardiomegaly, congested hilar vasculature.   EKG 60 bpm, normal axis, left bundle branch block sinus rhythm, Q-wave V1-V3, no significant ST segment or T wave changes.   Patient was admitted to the progressive care unit, he was placed on furosemide for diuresis.   Clinically has been improving with negative fluid balance, improvement in dyspnea and oxygenation.    Hospitalization complicated with urinary retention and hematuria. Urology was consulted and Foley cathter was placed.   Also complicated by acute  metabolic encephalopathy likely multifactorial secondary to hypercarbia versus parapneumonic effusion versus others  05/16/2021: Patient was seen and examined at bedside.  His daughter was present in the room.  He is on BiPAP and denies having any new complaints.  He is alert and oriented x3.    Assessment/Plan: Principal Problem:   Acute on chronic diastolic CHF (congestive heart failure) (HCC) Active Problems:   Elevated troponin   Unstable angina (HCC)   Pulmonary hypertension (HCC)   Acute on chronic respiratory failure with hypoxia and hypercapnia (HCC)   CKD (chronic kidney disease), stage IV (HCC)   Interstitial lung disease (HCC)   B12 deficiency   Pressure injury of skin   Moderate major depression, single episode (HCC)   Atypical chest pain   SOB (shortness of breath)  Acute on chronic diastolic heart failure exacerbation, acute cardiogenic pulmonary edema with acute hypoxemic hypercarbic respiratory failure. Volume status continue to improve, edema at his lower extremities, is improving and his oxygen requirements are at 4 l/min per [place].  Is on 4 L nasal cannula continuously at baseline.  Currently on 4 L nasal cannula.  On BiPAP nightly. Continue to be very weak and deconditioned. Net I&O -17.2 L.   Continue diuresis with furosemide 80 mg po bid.    Old records personally reviewed, echocardiogram from 04/2021 with preserved LV systolic function EF 60 to 74%, RV systolic function preserved.    Resolved acute metabolic encephalopathy, suspect multifactorial, secondary to hypercarbia and presumptive UTI. Mentation is back to baseline this morning. Continue to treat underlying condition Reorient as needed.  Parapneumonic effusion He was started on Merrem overnight 05/16/21 Febrile this a.m. with T-max of 100.7. UA done on 05/16/2021 due to altered mental  status was positive for pyuria. He was started on meropenem overnight for parapneumonic effusion. Complete 3 days of  Merrem   Worsening AKI on CKD stage IV, hypokalemia Creatinine uptrending 3.93 with GFR 15.  On 05/15/2021, cr of 3,25 with K of 3,5 and serum bicarbonate at 36. P.o. Lasix on hold due to AKI. Avoid nephrotoxic agents, and hypotension. For anemia of chronic renal disease with iron deficiency, on EPO.   HTN  BPs have been soft, maintain MAP greater than 65. Currently on carvedilol, clonidine, hydralazine  Closely monitor vital signs.   T2DM/ dyslipidemia Fasting glucose is 253, capillary 196, 269 and 220.  Plan to continue with basal insulin 30 units and add pre-meal insulin  3 units.  On statin therapy and ezetimibe    Obesity class 3, OSA. Bilateral buttocks pressure ulcer stage 2 (present on admission) Continue with Bipap and Cpap as needed Local skin care.   Hypothyroidism.   Continue home levothyroxine    Anxiety.  Continue as needed alprazolam and escitalopram.    Nausea.  Likely multifactorial, will continue antiacid therapy with pantoprazole, add pre-meal metoclopramide and continue with as needed ondansetron.   Urinary retention with hematuria.  Continue foley catheter an follow up with urology recommendations.  Morbid obesity BMI 40 Recommend weight loss outpatient with regular physical activity and healthy dieting.  Patient continues to be at high risk for worsening renal failure and heart failure.   Status is: Inpatient   Remains inpatient appropriate because: Hemodynamic and renal monitoring.   DVT prophylaxis:      Heparin SQ 3 times daily Code Status:              full  Family Communication: Updated his daughter at bedside.         Skin Documentation: Pressure Injury 05/04/21 Buttocks Right;Left Stage 2 -  Partial thickness loss of dermis presenting as a shallow open injury with a red, pink wound bed without slough. Pressure wound on buttocks (Active)  05/04/21 1145  Location: Buttocks  Location Orientation: Right;Left  Staging: Stage 2 -  Partial  thickness loss of dermis presenting as a shallow open injury with a red, pink wound bed without slough.  Wound Description (Comments): Pressure wound on buttocks  Present on Admission: Yes     Pressure Injury 05/11/21 Buttocks Right Stage 2 -  Partial thickness loss of dermis presenting as a shallow open injury with a red, pink wound bed without slough. (Active)  05/11/21 1000  Location: Buttocks  Location Orientation: Right  Staging: Stage 2 -  Partial thickness loss of dermis presenting as a shallow open injury with a red, pink wound bed without slough.  Wound Description (Comments):   Present on Admission:      Pressure Injury 05/11/21 Buttocks Left Stage 2 -  Partial thickness loss of dermis presenting as a shallow open injury with a red, pink wound bed without slough. (Active)  05/11/21 1000  Location: Buttocks  Location Orientation: Left  Staging: Stage 2 -  Partial thickness loss of dermis presenting as a shallow open injury with a red, pink wound bed without slough.  Wound Description (Comments):   Present on Admission:         Consultants:  Nephrology       Status is: Inpatient  Inpatient status.  Patient will require at least 2 midnights for further evaluation and treatment of present condition.      Objective: Vitals:   05/15/21 1921 05/16/21 0113 05/16/21 0834 05/16/21  1137  BP: (!) 141/63 (!) 121/47 (!) 135/53 (!) 119/49  Pulse: 64 (!) 59 64 63  Resp: 18 18 18 18   Temp: (!) 100.6 F (38.1 C) (!) 100.7 F (38.2 C) (!) 100.5 F (38.1 C) 100.2 F (37.9 C)  TempSrc: Oral Axillary    SpO2: 100% 100% 99% 94%  Weight:      Height:        Intake/Output Summary (Last 24 hours) at 05/16/2021 1233 Last data filed at 05/16/2021 0556 Gross per 24 hour  Intake 300 ml  Output 2450 ml  Net -2150 ml   Filed Weights   05/08/21 0500 05/10/21 0310 05/11/21 1021  Weight: 108.2 kg 117.5 kg 110.3 kg    Exam:  General: 75 y.o. year-old male morbidly obese in no  acute distress.  Alert and oriented x3. Cardiovascular: Regular rate and rhythm with no rubs or gallops.  No thyromegaly or JVD noted.   Respiratory: Clear to auscultation with no wheezes or rales. Good inspiratory effort. Abdomen: Soft nontender nondistended with normal bowel sounds x4 quadrants. Musculoskeletal: Trace lower extremity edema. 2/4 pulses in all 4 extremities. Skin: No ulcerative lesions noted or rashes, Psychiatry: Mood is appropriate for condition and setting   Data Reviewed: CBC: Recent Labs  Lab 05/10/21 0501 05/11/21 0422 05/12/21 0429 05/13/21 0524 05/14/21 0601 05/15/21 2055  WBC 4.3 3.9* 4.3 4.6 4.1 12.4*  NEUTROABS 3.0 2.6 2.7  --   --  10.6*  HGB 8.4* 8.4* 8.5* 8.5* 7.9* 8.4*  HCT 27.2* 27.6* 27.7* 27.3* 25.8* 27.4*  MCV 88.0 88.7 89.6 88.9 89.0 87.8  PLT 259 246 266 263 274 696   Basic Metabolic Panel: Recent Labs  Lab 05/11/21 0422 05/12/21 0428 05/12/21 1017 05/13/21 0524 05/14/21 0601 05/15/21 0449 05/16/21 0516  NA 137 136  --  137  139 140 137 136  K 3.5 3.2*  --  3.5  3.6 3.4* 3.5 3.6  CL 96* 94*  --  94*  96* 93* 92* 91*  CO2 33* 34*  --  34*  35* 37* 36* 37*  GLUCOSE 215* 162*  --  193*  192* 201* 253* 143*  BUN 85* 86*  --  82*  90* 93* 88* 84*  CREATININE 3.64* 3.25*  --  3.19*  3.29* 3.20* 3.25* 3.93*  CALCIUM 8.0* 7.7*  --  8.0*  8.1* 8.4* 8.5* 8.3*  MG  --   --  2.0 2.3 2.1  --  2.3  PHOS 5.5* 4.8*  --  3.4  3.7 3.9  4.1 3.6  --    GFR: Estimated Creatinine Clearance: 19.2 mL/min (A) (by C-G formula based on SCr of 3.93 mg/dL (H)). Liver Function Tests: Recent Labs  Lab 05/11/21 0422 05/12/21 0428 05/13/21 0524 05/14/21 0601 05/15/21 0449  ALBUMIN 1.9* 1.9* 1.9* 2.0* 2.1*   No results for input(s): LIPASE, AMYLASE in the last 168 hours. No results for input(s): AMMONIA in the last 168 hours. Coagulation Profile: No results for input(s): INR, PROTIME in the last 168 hours. Cardiac Enzymes: No results for  input(s): CKTOTAL, CKMB, CKMBINDEX, TROPONINI in the last 168 hours. BNP (last 3 results) No results for input(s): PROBNP in the last 8760 hours. HbA1C: No results for input(s): HGBA1C in the last 72 hours. CBG: Recent Labs  Lab 05/15/21 1146 05/15/21 1628 05/15/21 2039 05/16/21 0836 05/16/21 1117  GLUCAP 196* 251* 173* 145* 156*   Lipid Profile: No results for input(s): CHOL, HDL, LDLCALC, TRIG, CHOLHDL, LDLDIRECT in the  last 72 hours. Thyroid Function Tests: No results for input(s): TSH, T4TOTAL, FREET4, T3FREE, THYROIDAB in the last 72 hours. Anemia Panel: No results for input(s): VITAMINB12, FOLATE, FERRITIN, TIBC, IRON, RETICCTPCT in the last 72 hours. Urine analysis:    Component Value Date/Time   COLORURINE PINK (A) 05/16/2021 0124   APPEARANCEUR CLOUDY (A) 05/16/2021 0124   LABSPEC 1.005 05/16/2021 0124   PHURINE 5.0 05/16/2021 0124   GLUCOSEU NEGATIVE 05/16/2021 0124   HGBUR MODERATE (A) 05/16/2021 0124   BILIRUBINUR NEGATIVE 05/16/2021 0124   BILIRUBINUR neg 12/06/2010 1627   KETONESUR NEGATIVE 05/16/2021 0124   PROTEINUR NEGATIVE 05/16/2021 0124   UROBILINOGEN 0.2 12/06/2010 1627   NITRITE NEGATIVE 05/16/2021 0124   LEUKOCYTESUR TRACE (A) 05/16/2021 0124   Sepsis Labs: @LABRCNTIP (procalcitonin:4,lacticidven:4)  )No results found for this or any previous visit (from the past 240 hour(s)).    Studies: DG Chest Port 1 View  Result Date: 05/15/2021 CLINICAL DATA:  Acute respiratory failure.  Hypoxia. EXAM: PORTABLE CHEST 1 VIEW COMPARISON:  05/08/2021 FINDINGS: Lung volumes are small and pulmonary insufflation has diminished since prior examination. Patchy infiltrate within the left mid and lower lung zone is again noted, possibly infectious or inflammatory in nature. Small left pleural effusion persists, unchanged. No pneumothorax. No pleural effusion on the right. Cardiac size within normal limits. No acute bone abnormality. Degenerative changes noted within  the right shoulder. IMPRESSION: Progressive pulmonary hypoinflation. Stable left basilar pulmonary infiltrate and small left pleural effusion, possibly reflecting a focal pneumonic infiltrate and associated parapneumonic effusion. Electronically Signed   By: Fidela Salisbury M.D.   On: 05/15/2021 20:45    Scheduled Meds:  acetaminophen  650 mg Oral TID   acetaZOLAMIDE  250 mg Oral BID   aspirin EC  81 mg Oral Daily   atorvastatin  80 mg Oral QHS   calcitRIOL  0.25 mcg Oral Daily   carvedilol  25 mg Oral BID WC   Chlorhexidine Gluconate Cloth  6 each Topical Daily   cloNIDine  0.1 mg Oral TID   clopidogrel  75 mg Oral Daily   [START ON 05/17/2021] cosyntropin  0.25 mg Intravenous Once   dorzolamide-timolol  1 drop Both Eyes BID   epoetin (EPOGEN/PROCRIT) injection  3,000 Units Subcutaneous Q14 Days   escitalopram  5 mg Oral Daily   ezetimibe  10 mg Oral Daily   feeding supplement (GLUCERNA SHAKE)  237 mL Oral BID BM   fluticasone furoate-vilanterol  1 puff Inhalation Daily   And   umeclidinium bromide  1 puff Inhalation Daily   gabapentin  100 mg Oral BID   hydrALAZINE  25 mg Oral TID   insulin aspart  0-20 Units Subcutaneous TID WC   insulin aspart  0-5 Units Subcutaneous QHS   insulin aspart  3 Units Subcutaneous TID WC   insulin detemir  30 Units Subcutaneous QHS   isosorbide mononitrate  120 mg Oral Daily   levothyroxine  50 mcg Oral Q0600   montelukast  10 mg Oral Daily   pantoprazole  40 mg Oral Daily   sodium chloride flush  3 mL Intravenous Q12H   tamsulosin  0.4 mg Oral QHS   cyanocobalamin  1,000 mcg Oral Daily    Continuous Infusions:  sodium chloride 10 mL/hr at 05/13/21 1614   meropenem (MERREM) IV 500 mg (05/16/21 0246)     LOS: 13 days     Kayleen Memos, MD [facility] Pager [phone]  If 7PM-7AM, please contact night-coverage [email or web address] Password [email or web address]  05/16/2021, 12:33 PM

## 2021-05-16 NOTE — Progress Notes (Signed)
Initial Nutrition Assessment  DOCUMENTATION CODES:   Morbid obesity  INTERVENTION:   -D/c Glucerna Shake -Ensure Enlive po TID, each supplement provides 350 kcal and 20 grams of protein  -MVI with minerals daily  NUTRITION DIAGNOSIS:   Increased nutrient needs related to wound healing as evidenced by estimated needs.  GOAL:   Patient will meet greater than or equal to 90% of their needs  MONITOR:   PO intake, Supplement acceptance, Labs, Weight trends, Skin, I & O's  REASON FOR ASSESSMENT:   Low Braden    ASSESSMENT:   Stephen [name] is a 75 y.o. male with medical history significant for , interstitial lung disease, pulmonary hypertension, OSA with chronic respiratory failure on home O2 at 4L and Bipap, followed by pulmonology, as well as diastolic heart failure, CAD, DM, CKD IV, recently hospitalized from 10/16-10/29 with NSTEMI treated with heparin infusion, and respiratory failure secondary to CHF and COPD exacerbation who returns to the ED 2 days later with complaints of chest pain and shortness of breath. Chest pain was severe heaviness on his chest.EMS reports sats of 91% on his normal 4 L.  They attempted to increase it to 6 L with minimal improvement to 93% and and then put him on a nonrebreather and sats were 100%.  Wife contributes to history .  Pt admitted with NSTEMI/ unstable angina.   Reviewed I/O's: -2.2 L x 24 hours and -17.3 L since admission  UOP: 2.5 L x 24 hours  Pt currently on Bi-pap and lethargic. He is unable to provide history.   Spoke with pt wife at bedside, who reports pt with  decreased oral intake both during admission and PTA. PTA intake was less as pt often got full quickly, as it was difficult for him to breathe and eating exacerbated this. Over the past 3 days, pt's intake has been limited secondary to nausea (noted meal completion 25%). Per wife, nausea medications were altered, but unsure if this has improved today. Noted pt consumed  about 50% of omelette for breakfast and lunch was untouched (pt not very hungry and wanted only a tomato sandwich).   Pt UBW is around 260#. Pt wife endorses wt loss, but suspects this is related to over-diuresis. Pt has experienced a 8.3% wt loss over the past 3 months, which is significant for time frame.   Discussed importance of good meal an supplement intake to promote healing. Per wife, pt likes Glucerna supplements given.   Pt with poor oral intake and would benefit from nutrient dense supplement. One Ensure Enlive supplement provides 350 kcals, 20 grams protein, and 44-45 grams of carbohydrate vs one Glucerna shake supplement, which provides 220 kcals, 10 grams of protein, and 26 grams of carbohydrate. Given pt's hx of DM, RD will reassess adequacy of PO intake, CBGS, and adjust supplement regimen as appropriate at follow-up.    Medications reviewed and include vitamin B-12.   Lab Results  Component Value Date   HGBA1C 7.5 (H) 04/07/2021   PTA DM medications are 30 units insulin glargine daily and 8 units insulin aspart TID with meals.   Labs reviewed: CBGS: 145-156 (inpatient orders for glycemic control are 0-20 units insulin aspart TID with meals, 0-5 units insulin aspart daily at bedtime, 30 units insulin detemri daily at bedtime).    NUTRITION - FOCUSED PHYSICAL EXAM:  Flowsheet Row Most Recent Value  Orbital Region No depletion  Upper Arm Region Mild depletion  Thoracic and Lumbar Region No depletion  Buccal Region  No depletion  Temple Region No depletion  Clavicle Bone Region No depletion  Clavicle and Acromion Bone Region No depletion  Scapular Bone Region No depletion  Dorsal Hand Mild depletion  Patellar Region Mild depletion  Anterior Thigh Region Mild depletion  Posterior Calf Region Mild depletion  Edema (RD Assessment) Mild  Hair Reviewed  Eyes Reviewed  Mouth Reviewed  Skin Reviewed  Nails Reviewed       Diet Order:   Diet Order             Diet  heart healthy/carb modified Room service appropriate? Yes; Fluid consistency: Thin  Diet effective now                   EDUCATION NEEDS:   Education needs have been addressed  Skin:  Skin Assessment: Skin Integrity Issues: Skin Integrity Issues:: Stage II Stage II: rt/ lt buttocks  Last BM:  05/14/21  Height:   Ht Readings from Last 1 Encounters:  04/12/2021 5\' 5"  (1.651 m)    Weight:   Wt Readings from Last 1 Encounters:  05/11/21 110.3 kg    Ideal Body Weight:  61.8 kg  BMI:  Body mass index is 40.45 kg/m.  Estimated Nutritional Needs:   Kcal:  1950-2150  Protein:  110-125 grams  Fluid:  > 1.9 L    Loistine Chance, RD, LDN, [place] Registered Dietitian II Certified Diabetes Care and Education Specialist Please refer to [facility] for RD and/or RD on-call/weekend/after hours pager

## 2021-05-17 ENCOUNTER — Inpatient Hospital Stay: Payer: Medicare HMO

## 2021-05-17 DIAGNOSIS — G9340 Encephalopathy, unspecified: Secondary | ICD-10-CM

## 2021-05-17 LAB — BLOOD GAS, ARTERIAL
Acid-Base Excess: 14.9 mmol/L — ABNORMAL HIGH (ref 0.0–2.0)
Bicarbonate: 41.2 mmol/L — ABNORMAL HIGH (ref 20.0–28.0)
FIO2: 0.36
Inspiratory PAP: 10
Mechanical Rate: 8
Mode: POSITIVE
O2 Saturation: 98.3 %
PEEP: 5 cmH2O
Patient temperature: 37
RATE: 13 resp/min
pCO2 arterial: 62 mmHg — ABNORMAL HIGH (ref 32.0–48.0)
pH, Arterial: 7.43 (ref 7.350–7.450)
pO2, Arterial: 107 mmHg (ref 83.0–108.0)

## 2021-05-17 LAB — AMYLASE, PLEURAL OR PERITONEAL FLUID: Amylase, Fluid: 49 U/L

## 2021-05-17 LAB — RESP PANEL BY RT-PCR (FLU A&B, COVID) ARPGX2
Influenza A by PCR: NEGATIVE
Influenza B by PCR: NEGATIVE
SARS Coronavirus 2 by RT PCR: POSITIVE — AB

## 2021-05-17 LAB — COMPREHENSIVE METABOLIC PANEL
ALT: 58 U/L — ABNORMAL HIGH (ref 0–44)
AST: 42 U/L — ABNORMAL HIGH (ref 15–41)
Albumin: 1.8 g/dL — ABNORMAL LOW (ref 3.5–5.0)
Alkaline Phosphatase: 70 U/L (ref 38–126)
Anion gap: 10 (ref 5–15)
BUN: 105 mg/dL — ABNORMAL HIGH (ref 8–23)
CO2: 34 mmol/L — ABNORMAL HIGH (ref 22–32)
Calcium: 8.1 mg/dL — ABNORMAL LOW (ref 8.9–10.3)
Chloride: 92 mmol/L — ABNORMAL LOW (ref 98–111)
Creatinine, Ser: 4.48 mg/dL — ABNORMAL HIGH (ref 0.61–1.24)
GFR, Estimated: 13 mL/min — ABNORMAL LOW (ref >60)
Glucose, Bld: 191 mg/dL — ABNORMAL HIGH (ref 70–99)
Potassium: 3.6 mmol/L (ref 3.5–5.1)
Sodium: 136 mmol/L (ref 135–145)
Total Bilirubin: 0.9 mg/dL (ref 0.3–1.2)
Total Protein: 6.8 g/dL (ref 6.5–8.1)

## 2021-05-17 LAB — BODY FLUID CELL COUNT WITH DIFFERENTIAL
Eos, Fluid: 1 %
Lymphs, Fluid: 57 %
Monocyte-Macrophage-Serous Fluid: 18 %
Neutrophil Count, Fluid: 24 %
Total Nucleated Cell Count, Fluid: 921 cu mm

## 2021-05-17 LAB — CBC WITH DIFFERENTIAL/PLATELET
Abs Immature Granulocytes: 0.1 10*3/uL — ABNORMAL HIGH (ref 0.00–0.07)
Basophils Absolute: 0.1 10*3/uL (ref 0.0–0.1)
Basophils Relative: 0 %
Eosinophils Absolute: 0.3 10*3/uL (ref 0.0–0.5)
Eosinophils Relative: 2 %
HCT: 25.8 % — ABNORMAL LOW (ref 39.0–52.0)
Hemoglobin: 7.6 g/dL — ABNORMAL LOW (ref 13.0–17.0)
Immature Granulocytes: 1 %
Lymphocytes Relative: 6 %
Lymphs Abs: 0.9 10*3/uL (ref 0.7–4.0)
MCH: 27 pg (ref 26.0–34.0)
MCHC: 29.5 g/dL — ABNORMAL LOW (ref 30.0–36.0)
MCV: 91.8 fL (ref 80.0–100.0)
Monocytes Absolute: 1.4 10*3/uL — ABNORMAL HIGH (ref 0.1–1.0)
Monocytes Relative: 10 %
Neutro Abs: 12.4 10*3/uL — ABNORMAL HIGH (ref 1.7–7.7)
Neutrophils Relative %: 81 %
Platelets: 290 10*3/uL (ref 150–400)
RBC: 2.81 MIL/uL — ABNORMAL LOW (ref 4.22–5.81)
RDW: 16.5 % — ABNORMAL HIGH (ref 11.5–15.5)
WBC: 15.1 10*3/uL — ABNORMAL HIGH (ref 4.0–10.5)
nRBC: 0 % (ref 0.0–0.2)

## 2021-05-17 LAB — MRSA NEXT GEN BY PCR, NASAL: MRSA by PCR Next Gen: NOT DETECTED

## 2021-05-17 LAB — ALBUMIN, PLEURAL OR PERITONEAL FLUID: Albumin, Fluid: <1.5 g/dL

## 2021-05-17 LAB — GLUCOSE, CAPILLARY
Glucose-Capillary: 204 mg/dL — ABNORMAL HIGH (ref 70–99)
Glucose-Capillary: 266 mg/dL — ABNORMAL HIGH (ref 70–99)
Glucose-Capillary: 207 mg/dL — ABNORMAL HIGH (ref 70–99)
Glucose-Capillary: 179 mg/dL — ABNORMAL HIGH (ref 70–99)

## 2021-05-17 LAB — ACTH STIMULATION, 3 TIME POINTS
Cortisol, 30 Min: 26.1 ug/dL
Cortisol, 60 Min: 28.9 ug/dL
Cortisol, Base: 14 ug/dL

## 2021-05-17 LAB — LACTATE DEHYDROGENASE: LDH: 136 U/L (ref 98–192)

## 2021-05-17 LAB — LACTATE DEHYDROGENASE, PLEURAL OR PERITONEAL FLUID: LD, Fluid: 121 U/L — ABNORMAL HIGH (ref 3–23)

## 2021-05-17 LAB — GLUCOSE, PLEURAL OR PERITONEAL FLUID: Glucose, Fluid: 226 mg/dL

## 2021-05-17 LAB — PROCALCITONIN: Procalcitonin: 0.66 ng/mL

## 2021-05-17 LAB — PROTEIN, TOTAL: Total Protein: 7.1 g/dL (ref 6.5–8.1)

## 2021-05-17 MED ORDER — IPRATROPIUM-ALBUTEROL 0.5-2.5 (3) MG/3ML IN SOLN
3.0000 mL | Freq: Four times a day (QID) | RESPIRATORY_TRACT | Status: DC
  Administered 2021-05-17 – 2021-05-18 (×4): 3 mL via RESPIRATORY_TRACT
  Filled 2021-05-17 (×4): qty 3

## 2021-05-17 MED ORDER — SODIUM CHLORIDE 3 % IN NEBU
4.0000 mL | INHALATION_SOLUTION | Freq: Two times a day (BID) | RESPIRATORY_TRACT | Status: DC
  Administered 2021-05-18 – 2021-05-19 (×3): 4 mL via RESPIRATORY_TRACT
  Filled 2021-05-17 (×5): qty 4

## 2021-05-17 MED ORDER — HEPARIN SODIUM (PORCINE) 5000 UNIT/ML IJ SOLN
5000.0000 [IU] | Freq: Three times a day (TID) | INTRAMUSCULAR | Status: DC
  Administered 2021-05-18 – 2021-05-19 (×5): 5000 [IU] via SUBCUTANEOUS
  Filled 2021-05-17 (×5): qty 1

## 2021-05-17 MED ORDER — ACETYLCYSTEINE 20 % IN SOLN
2.0000 mL | RESPIRATORY_TRACT | Status: AC
  Filled 2021-05-17 (×2): qty 4

## 2021-05-17 MED ORDER — HYDRALAZINE HCL 10 MG PO TABS
10.0000 mg | ORAL_TABLET | Freq: Three times a day (TID) | ORAL | Status: DC
  Administered 2021-05-17 – 2021-05-19 (×6): 10 mg via ORAL
  Filled 2021-05-17 (×9): qty 1

## 2021-05-17 MED ORDER — CARVEDILOL 12.5 MG PO TABS
12.5000 mg | ORAL_TABLET | Freq: Two times a day (BID) | ORAL | Status: DC
  Administered 2021-05-17 – 2021-05-19 (×5): 12.5 mg via ORAL
  Filled 2021-05-17 (×5): qty 1

## 2021-05-17 MED ORDER — GUAIFENESIN ER 600 MG PO TB12
1200.0000 mg | ORAL_TABLET | Freq: Two times a day (BID) | ORAL | Status: DC
  Administered 2021-05-17 – 2021-05-19 (×5): 1200 mg via ORAL
  Filled 2021-05-17 (×5): qty 2

## 2021-05-17 NOTE — Progress Notes (Signed)
[facility]  PROGRESS NOTE   Subjective:   Wife at bedside this morning.   Afebrile  UOP 839mL  (2455mL)  Creatinine 4.48 (3.93) (3.25) BUN 191 (143)  Left sided thoracentesis this morning with 793mL of fluid removed.     Objective:  Vital signs in last 24 hours:  Temp:  [97.5 F (36.4 C)-98.4 F (36.9 C)] 97.5 F (36.4 C) (11/15 1148) Pulse Rate:  [54-61] 58 (11/15 1148) Resp:  [14-18] 16 (11/15 0451) BP: (99-131)/(43-82) 106/54 (11/15 1148) SpO2:  [94 %-100 %] 99 % (11/15 1148) Weight:  [110.2 kg] 110.2 kg (11/15 0600)  Weight change:  Filed Weights   05/10/21 0310 05/11/21 1021 05/17/21 0600  Weight: 117.5 kg 110.3 kg 110.2 kg    Intake/Output: I/O last 3 completed shifts: In: 660.1 [P.O.:120; Other:240; IV Piggyback:300.1] Out: 1300 [Urine:1300]   Intake/Output this shift:  No intake/output data recorded.  Physical Exam: General:  No acute distress  Head:  Normocephalic, atraumatic. Moist oral mucosal membranes  Eyes:  Anicteric  Neck:  Supple  Lungs:   Clear to auscultation, normal effort  Heart:  regular  Abdomen:   Soft, nontender, bowel sounds present  Extremities:  peripheral edema.  Neurologic:  Awake, alert, following commands  Skin:  No lesions  GU Foley catheter with red colored urine.     Basic Metabolic Panel: Recent Labs  Lab 05/11/21 0422 05/12/21 0428 05/12/21 1017 05/13/21 0524 05/14/21 0601 05/15/21 0449 05/16/21 0516 05/17/21 0442  NA 137 136  --  137  139 140 137 136 136  K 3.5 3.2*  --  3.5  3.6 3.4* 3.5 3.6 3.6  CL 96* 94*  --  94*  96* 93* 92* 91* 92*  CO2 33* 34*  --  34*  35* 37* 36* 37* 34*  GLUCOSE 215* 162*  --  193*  192* 201* 253* 143* 191*  BUN 85* 86*  --  82*  90* 93* 88* 84* 105*  CREATININE 3.64* 3.25*  --  3.19*  3.29* 3.20* 3.25* 3.93* 4.48*  CALCIUM 8.0* 7.7*  --  8.0*  8.1* 8.4* 8.5* 8.3* 8.1*  MG  --   --  2.0 2.3 2.1  --  2.3  --   PHOS 5.5* 4.8*  --  3.4  3.7 3.9  4.1 3.6   --   --      CBC: Recent Labs  Lab 05/11/21 0422 05/12/21 0429 05/13/21 0524 05/14/21 0601 05/15/21 2055 05/17/21 0442  WBC 3.9* 4.3 4.6 4.1 12.4* 15.1*  NEUTROABS 2.6 2.7  --   --  10.6* 12.4*  HGB 8.4* 8.5* 8.5* 7.9* 8.4* 7.6*  HCT 27.6* 27.7* 27.3* 25.8* 27.4* 25.8*  MCV 88.7 89.6 88.9 89.0 87.8 91.8  PLT 246 266 263 274 312 290      Urinalysis: Recent Labs    05/16/21 0124  COLORURINE PINK*  LABSPEC 1.005  PHURINE 5.0  GLUCOSEU NEGATIVE  HGBUR MODERATE*  BILIRUBINUR NEGATIVE  KETONESUR NEGATIVE  PROTEINUR NEGATIVE  NITRITE NEGATIVE  LEUKOCYTESUR TRACE*       Imaging: CT CHEST WO CONTRAST  Result Date: 05/17/2021 CLINICAL DATA:  Pneumonia EXAM: CT CHEST WITHOUT CONTRAST TECHNIQUE: Multidetector CT imaging of the chest was performed following the standard protocol without IV contrast. COMPARISON:  Previous studies including CT chest done on 04/05/2021 and chest radiograph done on 05/08/2021 FINDINGS: Cardiovascular: Heart is enlarged in size. Coronary artery calcifications are seen. Dense calcifications are seen in the mitral annulus. There are  coarse calcifications in the thoracic aorta. Small pericardial effusion is present. Mediastinum/Nodes: No significant lymphadenopathy seen. Lungs/Pleura: There is almost complete atelectasis of left lung with air bronchograms. Left main bronchus appears narrower than the right. As far as seen, there is no definite demonstrable extrinsic mass lesion adjacent to the left main bronchus. Small left pleural effusion is present. There is minimal right pleural effusion. There is crowding of markings in the posterior right lung, possibly subsegmental atelectasis. There is no pneumothorax. Upper Abdomen: There is 2.8 cm nodule in the left adrenal with no significant change. Surgical clips are seen in the right adrenal bed. Possible cyst is seen in the upper pole of left kidney. Musculoskeletal: Fairly extensive calcifications are seen in  the anterior spinal ligament. Please correlate for possible ankylosing spondylitis. IMPRESSION: There is almost complete atelectasis of left lung, possibly due to mucous plugging. Small left pleural effusion is present. Linear densities and subtle ground-glass densities in the right lung may suggest scarring and subsegmental atelectasis. There is minimal right pleural effusion. There is 2.8 cm nodule in the left adrenal with no significant change. Other findings as described in the body of the report. Imaging finding of almost complete atelectasis of left lung was relayed to patient's nurse Ronalee Belts by telephone call. Electronically Signed   By: Elmer Picker M.D.   On: 05/17/2021 10:55   DG Chest Port 1 View  Result Date: 05/17/2021 CLINICAL DATA:  Post thoracentesis EXAM: PORTABLE CHEST 1 VIEW COMPARISON:  05/15/2021 FINDINGS: No pneumothorax. Low lung volumes. Remains opacification at the left lung base. Increased right basilar atelectasis. IMPRESSION: No pneumothorax. Remains left lung base opacification, which may reflect combination atelectasis/consolidation and residual pleural fluid. Increased right basilar atelectasis. Electronically Signed   By: Macy Mis M.D.   On: 05/17/2021 11:55   DG Chest Port 1 View  Result Date: 05/15/2021 CLINICAL DATA:  Acute respiratory failure.  Hypoxia. EXAM: PORTABLE CHEST 1 VIEW COMPARISON:  05/08/2021 FINDINGS: Lung volumes are small and pulmonary insufflation has diminished since prior examination. Patchy infiltrate within the left mid and lower lung zone is again noted, possibly infectious or inflammatory in nature. Small left pleural effusion persists, unchanged. No pneumothorax. No pleural effusion on the right. Cardiac size within normal limits. No acute bone abnormality. Degenerative changes noted within the right shoulder. IMPRESSION: Progressive pulmonary hypoinflation. Stable left basilar pulmonary infiltrate and small left pleural effusion,  possibly reflecting a focal pneumonic infiltrate and associated parapneumonic effusion. Electronically Signed   By: Fidela Salisbury M.D.   On: 05/15/2021 20:45   US THORACENTESIS ASP PLEURAL SPACE W/IMG GUIDE  Result Date: 05/17/2021 INDICATION: Shortness of breath with left pleural effusion, concern for infection. EXAM: ULTRASOUND GUIDED LEFT THORACENTESIS MEDICATIONS: Local 1% lidocaine only. COMPLICATIONS: None immediate. PROCEDURE: An ultrasound guided thoracentesis was thoroughly discussed with the patient and questions answered. The benefits, risks, alternatives and complications were also discussed. The patient understands and wishes to proceed with the procedure. Written consent was obtained. Ultrasound was performed to localize and mark an adequate pocket of fluid in the left chest. The area was then prepped and draped in the normal sterile fashion. 1% Lidocaine was used for local anesthesia. Under ultrasound guidance a 19 gauge, 7-cm, Yueh catheter was introduced. Thoracentesis was performed. The catheter was removed and a dressing applied. FINDINGS: A total of approximately 700 mL of blood tinged fluid was removed. Samples were sent to the laboratory as requested by the clinical team. IMPRESSION: Successful ultrasound guided left thoracentesis yielding  700 mL of pleural fluid. Read By: Tsosie Billing PA-C Electronically Signed   By: Albin Felling M.D.   On: 05/17/2021 11:24     Medications:    sodium chloride 10 mL/hr at 05/13/21 1614   meropenem (MERREM) IV 500 mg (05/17/21 0151)    acetaminophen  650 mg Oral TID   acetylcysteine  2 mL Nebulization STAT   aspirin EC  81 mg Oral Daily   atorvastatin  80 mg Oral QHS   calcitRIOL  0.25 mcg Oral Daily   carvedilol  12.5 mg Oral BID WC   Chlorhexidine Gluconate Cloth  6 each Topical Daily   cloNIDine  0.1 mg Oral TID   clopidogrel  75 mg Oral Daily   dorzolamide-timolol  1 drop Both Eyes BID   epoetin (EPOGEN/PROCRIT) injection  3,000  Units Subcutaneous Q14 Days   escitalopram  5 mg Oral Daily   ezetimibe  10 mg Oral Daily   feeding supplement  237 mL Oral TID BM   fluticasone furoate-vilanterol  1 puff Inhalation Daily   And   umeclidinium bromide  1 puff Inhalation Daily   gabapentin  100 mg Oral BID   [START ON 05/18/2021] heparin injection (subcutaneous)  5,000 Units Subcutaneous Q8H   hydrALAZINE  10 mg Oral TID   insulin aspart  0-20 Units Subcutaneous TID WC   insulin aspart  0-5 Units Subcutaneous QHS   insulin aspart  3 Units Subcutaneous TID WC   insulin detemir  30 Units Subcutaneous QHS   isosorbide mononitrate  120 mg Oral Daily   levothyroxine  50 mcg Oral Q0600   montelukast  10 mg Oral Daily   multivitamin with minerals  1 tablet Oral Daily   pantoprazole  40 mg Oral Daily   sodium chloride flush  3 mL Intravenous Q12H   tamsulosin  0.4 mg Oral QHS   cyanocobalamin  1,000 mcg Oral Daily    Assessment/ Plan:     Principal Problem:   Acute on chronic diastolic CHF (congestive heart failure) (HCC) Active Problems:   Elevated troponin   Unstable angina (HCC)   Pulmonary hypertension (HCC)   Acute on chronic respiratory failure with hypoxia and hypercapnia (HCC)   CKD (chronic kidney disease), stage IV (HCC)   Interstitial lung disease (HCC)   B12 deficiency   Pressure injury of skin   Moderate major depression, single episode (HCC)   Atypical chest pain   SOB (shortness of breath)  [name] is a 79 y.o. white male with hypertension, coronary artery disease, congestive heart failure, diabetes mellitus type II, peripheral vascular disease, COPD who is admitted to [facility] on 04/03/2021 for Unstable angina ([place]) [I20.0] CHF (congestive heart failure) ([place]) [I50.9] Pain [R52] Atypical chest pain [R07.89] NSTEMI (non-ST elevated myocardial infarction) ([place]) [I21.4]  Acute kidney injury on chronic kidney disease stage IV baseline creatinine of 2.92 GFR of 22 on 02/24/21. Acute kidney  injury secondary to obstructive uropathy. Followed by Dr. Holley Raring as outpatient.  Concern for overdiuresis. Discontinue acetazolamide, continue to hold furosemide.  Low threshold for dialysis. Discussed with patient and his wife.   Hypertension and acute exacerbation of chronic diastolic congestive heart failure. Currently holding furosemide and actazolamide. Home regimen of clonidine, torsemide, carvedilol, isosorbide mononitrate and tamsulosin.  Status post Left thoracentesis.  Continue to hold diuretics as above.  Continue tamsulosin, carvedilol, hydralazine, and clonidine.   Anemia with chronic kidney disease: hemoglobin 7.6. Patient received EPO on 11/7.   Hematuria: Urology on  11/12 determined that hematuria was due to foley catheterization. Patient underwent manual irrigations.   Secondary Hyperparathyroidism: PTH 182 on 02/08/21. Calcium and phosphorus at goal on most recent labs.  - calcitriol   LOS: [place], [facility] 11/15/202212:51 PM

## 2021-05-17 NOTE — Progress Notes (Addendum)
PROGRESS NOTE  Stephen [name] IDP:[id number] DOB: 1945/11/30 DOA: 04/19/2021 PCP: [name], MD  HPI/Recap of past 24 hours: Stephen [name] wad admitted to the hospital with the working diagnosis of acute decompensated diastolic heart failure.   75 year old male past medical history for interstitial lung disease, pulmonary hypertension, obstructive sleep apnea, chronic hypoxic respiratory failure, coronary artery disease, type 2 diabetes mellitus, chronic kidney disease stage IV who presented with chest pain and dyspnea.  Recent hospitalization 10/16-2010/29 for non-STEMI and heart failure, 2 days after his discharge he had recurrent chest pain and dyspnea.  EMS was called and his oxygen saturation was 91% on 4 L, he was placed on a nonrebreather and transported to the hospital.  On his initial physical examination his oximetry was 98% on 6 L, his respiratory rate was 22, blood pressure 123/53, heart rate 59, temperature 98.4, his lungs had decreased breath sounds bilaterally, heart S1-S2, present, rhythmic, abdomen soft nontender, positive 3+ lower extremity edema.   Sodium 135, potassium 4.5, chloride 99, bicarb 28, glucose 241, BUN 72, creatinine 3.12, BNP 1205, high sensitive troponin 1417, 1514. white count 11.6, hemoglobin 9.7, hematocrit 39.8, platelets 332. SARS COVID-19 negative.   Urinalysis specific gravity 1.014, RBCs 0-5, white cells 0-5.   Chest radiograph with cardiomegaly, congested hilar vasculature.   EKG 60 bpm, normal axis, left bundle branch block sinus rhythm, Q-wave V1-V3, no significant ST segment or T wave changes.   Patient was admitted to the progressive care unit, he was placed on furosemide for diuresis.   Clinically has been improving with negative fluid balance, improvement in dyspnea and oxygenation.    Hospitalization complicated with urinary retention and hematuria. Urology was consulted and Foley cathter was placed.   Also complicated by acute  metabolic encephalopathy likely multifactorial secondary to hypercarbia versus parapneumonic effusion.  05/17/2021: Patient was seen at his bedside.  His wife was present and updated in the room.  Patient had a CT scan of his chest done without contrast which showed complete left lung atelectasis for which PCCM was consulted.  He had a left thoracentesis with 700 cc of blood-tinged fluid removed and sent for analysis.  He has been followed by nephrology due to worsening AKI.    Assessment/Plan: Principal Problem:   Acute on chronic diastolic CHF (congestive heart failure) (HCC) Active Problems:   Elevated troponin   Unstable angina (HCC)   Pulmonary hypertension (HCC)   Acute on chronic respiratory failure with hypoxia and hypercapnia (HCC)   CKD (chronic kidney disease), stage IV (HCC)   Interstitial lung disease (HCC)   B12 deficiency   Pressure injury of skin   Moderate major depression, single episode (HCC)   Atypical chest pain   SOB (shortness of breath)  Acute on chronic diastolic heart failure exacerbation, acute cardiogenic pulmonary edema with acute hypoxemic hypercarbic respiratory failure. Diuretics held due to worsening AKI Cardiac medications doses adjusted, reduced doses to avoid hypotension. Hypoxic, on 5 L nasal cannula and on BiPAP nightly. Net I&O -17.5 L Cardiology consulted to assist with the management Old records personally reviewed, echocardiogram from 04/2021 with preserved LV systolic function EF 60 to 44%, RV systolic function preserved.    Acute metabolic encephalopathy, suspect multifactorial, secondary to hypercarbia, HCAP with parapneumonic effusion Continue to treat underlying conditions Follow sputum culture and left thoracentesis body fluid culture Infectious disease consulted  Parapneumonic effusion status post left thoracentesis on 05/17/2021 by IR He was started on Merrem 05/16/21-was febrile with T-max of 100.7.  UA done on 05/16/2021 due to  altered mental status was positive for pyuria, suspect contaminated in the setting of indwelling Foley catheter. Currently on Merrem, ID following.  Left lung atelectasis seen on CT scan Personally reviewed CT scan done on 05/17/2021 which shows near complete left lung whiteout. Ordered stat Mucomyst Management per PCCM Maintain O2 saturation greater than 92%   Worsening AKI on CKD stage IV Creatinine uptrending 4.40 from 3.93 with GFR 13 from 15. Lasix held on 05/16/2021 due to AKI. Home oral antihypertensives doses reduced to avoid hypotension. Continue to avoid nephrotoxic agents, and hypotension. Nephrology following.  HTN, BPs are soft. Maintain MAP greater than 65 Home oral antihypertensive adjusted to avoid hypotension. Continue to closely monitor vital signs.   T2DM/ dyslipidemia Continue insulin coverage Continue statin therapy and Zetia.   Severe/morbid obesity OSA on BIPAP.  Bilateral buttocks pressure ulcer stage 2 (present on admission) BMI 40, recommend weight loss outpatient Continue BiPAP nightly Continue local wound care   Hypothyroidism.   Continue home levothyroxine    Chronic anxiety/depression Continue as needed alprazolam and escitalopram.    Nausea.  Likely multifactorial, will continue antiacid therapy with pantoprazole, add pre-meal metoclopramide and continue with as needed ondansetron.   Urinary retention with hematuria.  UA done on 05/16/2021 positive for pyuria, suspect contaminated Continue foley catheter an follow up with urology recommendations.  Goals of care Palliative care consulted to assist with establishing goals of care DNR as of 05/17/21    Critical care time: 65 minutes.   Patient continues to be at high risk for worsening renal failure and heart failure.   Status is: Inpatient   Remains inpatient appropriate because: Hemodynamic and renal monitoring.   DVT prophylaxis:      Heparin SQ 3 times daily Code Status:    Changed to DNR on 05/17/21 per PCCM Family Communication: Updated his daughter at bedside.         Skin Documentation: Pressure Injury 05/04/21 Buttocks Right;Left Stage 2 -  Partial thickness loss of dermis presenting as a shallow open injury with a red, pink wound bed without slough. Pressure wound on buttocks (Active)  05/04/21 1145  Location: Buttocks  Location Orientation: Right;Left  Staging: Stage 2 -  Partial thickness loss of dermis presenting as a shallow open injury with a red, pink wound bed without slough.  Wound Description (Comments): Pressure wound on buttocks  Present on Admission: Yes     Pressure Injury 05/11/21 Buttocks Right Stage 2 -  Partial thickness loss of dermis presenting as a shallow open injury with a red, pink wound bed without slough. (Active)  05/11/21 1000  Location: Buttocks  Location Orientation: Right  Staging: Stage 2 -  Partial thickness loss of dermis presenting as a shallow open injury with a red, pink wound bed without slough.  Wound Description (Comments):   Present on Admission:      Pressure Injury 05/11/21 Buttocks Left Stage 2 -  Partial thickness loss of dermis presenting as a shallow open injury with a red, pink wound bed without slough. (Active)  05/11/21 1000  Location: Buttocks  Location Orientation: Left  Staging: Stage 2 -  Partial thickness loss of dermis presenting as a shallow open injury with a red, pink wound bed without slough.  Wound Description (Comments):   Present on Admission:         Consultants:  Nephrology  Cardiology ID PCCM      Status is: Inpatient  Inpatient status.  Patient will  require at least 2 midnights for further evaluation and treatment of present condition.      Objective: Vitals:   05/17/21 0753 05/17/21 1035 05/17/21 1102 05/17/21 1148  BP: (!) 102/50 (!) 122/57 131/82 (!) 106/54  Pulse: (!) 55 (!) 58  (!) 58  Resp:      Temp: (!) 97.5 F (36.4 C)   (!) 97.5 F (36.4 C)   TempSrc: Oral     SpO2: 94% 100% 100% 99%  Weight:      Height:        Intake/Output Summary (Last 24 hours) at 05/17/2021 1616 Last data filed at 05/17/2021 0500 Gross per 24 hour  Intake 480.11 ml  Output 300 ml  Net 180.11 ml   Filed Weights   05/10/21 0310 05/11/21 1021 05/17/21 0600  Weight: 117.5 kg 110.3 kg 110.2 kg    Exam:  General: 75 y.o. year-old male morbidly obese in no acute distress.  Somnolent on BiPAP.   Cardiovascular: Regular rate and rhythm no rubs or gallops.  No JVD or thyromegaly noted.   Respiratory: Poor lung sounds on auscultation.  No wheezing noted.  Poor inspiratory effort.   Abdomen: Obese nontender nondistended bowel sounds present.   Musculoskeletal: Trace lower extremity edema bilaterally.   Skin: No ulcerative lesions noted.  Blood pressure wounds, present on admission. Psychiatry: Unable to assess mood due to somnolence. Neuro: Nonfocal, able to move all 4 extremities.   Data Reviewed: CBC: Recent Labs  Lab 05/11/21 0422 05/12/21 0429 05/13/21 0524 05/14/21 0601 05/15/21 2055 05/17/21 0442  WBC 3.9* 4.3 4.6 4.1 12.4* 15.1*  NEUTROABS 2.6 2.7  --   --  10.6* 12.4*  HGB 8.4* 8.5* 8.5* 7.9* 8.4* 7.6*  HCT 27.6* 27.7* 27.3* 25.8* 27.4* 25.8*  MCV 88.7 89.6 88.9 89.0 87.8 91.8  PLT 246 266 263 274 312 196   Basic Metabolic Panel: Recent Labs  Lab 05/11/21 0422 05/12/21 0428 05/12/21 1017 05/13/21 0524 05/14/21 0601 05/15/21 0449 05/16/21 0516 05/17/21 0442  NA 137 136  --  137  139 140 137 136 136  K 3.5 3.2*  --  3.5  3.6 3.4* 3.5 3.6 3.6  CL 96* 94*  --  94*  96* 93* 92* 91* 92*  CO2 33* 34*  --  34*  35* 37* 36* 37* 34*  GLUCOSE 215* 162*  --  193*  192* 201* 253* 143* 191*  BUN 85* 86*  --  82*  90* 93* 88* 84* 105*  CREATININE 3.64* 3.25*  --  3.19*  3.29* 3.20* 3.25* 3.93* 4.48*  CALCIUM 8.0* 7.7*  --  8.0*  8.1* 8.4* 8.5* 8.3* 8.1*  MG  --   --  2.0 2.3 2.1  --  2.3  --   PHOS 5.5* 4.8*  --  3.4  3.7  3.9  4.1 3.6  --   --    GFR: Estimated Creatinine Clearance: 16.8 mL/min (A) (by C-G formula based on SCr of 4.48 mg/dL (H)). Liver Function Tests: Recent Labs  Lab 05/12/21 0428 05/13/21 0524 05/14/21 0601 05/15/21 0449 05/17/21 0442 05/17/21 1350  AST  --   --   --   --  42*  --   ALT  --   --   --   --  58*  --   ALKPHOS  --   --   --   --  70  --   BILITOT  --   --   --   --  0.9  --   PROT  --   --   --   --  6.8 7.1  ALBUMIN 1.9* 1.9* 2.0* 2.1* 1.8*  --    No results for input(s): LIPASE, AMYLASE in the last 168 hours. No results for input(s): AMMONIA in the last 168 hours. Coagulation Profile: No results for input(s): INR, PROTIME in the last 168 hours. Cardiac Enzymes: No results for input(s): CKTOTAL, CKMB, CKMBINDEX, TROPONINI in the last 168 hours. BNP (last 3 results) No results for input(s): PROBNP in the last 8760 hours. HbA1C: No results for input(s): HGBA1C in the last 72 hours. CBG: Recent Labs  Lab 05/16/21 1117 05/16/21 1644 05/16/21 2017 05/17/21 0755 05/17/21 1146  GLUCAP 156* 133* 265* 204* 266*   Lipid Profile: No results for input(s): CHOL, HDL, LDLCALC, TRIG, CHOLHDL, LDLDIRECT in the last 72 hours. Thyroid Function Tests: No results for input(s): TSH, T4TOTAL, FREET4, T3FREE, THYROIDAB in the last 72 hours. Anemia Panel: No results for input(s): VITAMINB12, FOLATE, FERRITIN, TIBC, IRON, RETICCTPCT in the last 72 hours. Urine analysis:    Component Value Date/Time   COLORURINE PINK (A) 05/16/2021 0124   APPEARANCEUR CLOUDY (A) 05/16/2021 0124   LABSPEC 1.005 05/16/2021 0124   PHURINE 5.0 05/16/2021 0124   GLUCOSEU NEGATIVE 05/16/2021 0124   HGBUR MODERATE (A) 05/16/2021 0124   BILIRUBINUR NEGATIVE 05/16/2021 0124   BILIRUBINUR neg 12/06/2010 1627   KETONESUR NEGATIVE 05/16/2021 0124   PROTEINUR NEGATIVE 05/16/2021 0124   UROBILINOGEN 0.2 12/06/2010 1627   NITRITE NEGATIVE 05/16/2021 0124   LEUKOCYTESUR TRACE (A) 05/16/2021 0124    Sepsis Labs: @LABRCNTIP (procalcitonin:4,lacticidven:4)  )No results found for this or any previous visit (from the past 240 hour(s)).    Studies: CT CHEST WO CONTRAST  Result Date: 05/17/2021 CLINICAL DATA:  Pneumonia EXAM: CT CHEST WITHOUT CONTRAST TECHNIQUE: Multidetector CT imaging of the chest was performed following the standard protocol without IV contrast. COMPARISON:  Previous studies including CT chest done on 04/05/2021 and chest radiograph done on 05/08/2021 FINDINGS: Cardiovascular: Heart is enlarged in size. Coronary artery calcifications are seen. Dense calcifications are seen in the mitral annulus. There are coarse calcifications in the thoracic aorta. Small pericardial effusion is present. Mediastinum/Nodes: No significant lymphadenopathy seen. Lungs/Pleura: There is almost complete atelectasis of left lung with air bronchograms. Left main bronchus appears narrower than the right. As far as seen, there is no definite demonstrable extrinsic mass lesion adjacent to the left main bronchus. Small left pleural effusion is present. There is minimal right pleural effusion. There is crowding of markings in the posterior right lung, possibly subsegmental atelectasis. There is no pneumothorax. Upper Abdomen: There is 2.8 cm nodule in the left adrenal with no significant change. Surgical clips are seen in the right adrenal bed. Possible cyst is seen in the upper pole of left kidney. Musculoskeletal: Fairly extensive calcifications are seen in the anterior spinal ligament. Please correlate for possible ankylosing spondylitis. IMPRESSION: There is almost complete atelectasis of left lung, possibly due to mucous plugging. Small left pleural effusion is present. Linear densities and subtle ground-glass densities in the right lung may suggest scarring and subsegmental atelectasis. There is minimal right pleural effusion. There is 2.8 cm nodule in the left adrenal with no significant change. Other  findings as described in the body of the report. Imaging finding of almost complete atelectasis of left lung was relayed to patient's nurse Ronalee Belts by telephone call. Electronically Signed   By: Elmer Picker M.D.   On: 05/17/2021 10:55  DG Chest Port 1 View  Result Date: 05/17/2021 CLINICAL DATA:  Post thoracentesis EXAM: PORTABLE CHEST 1 VIEW COMPARISON:  05/15/2021 FINDINGS: No pneumothorax. Low lung volumes. Remains opacification at the left lung base. Increased right basilar atelectasis. IMPRESSION: No pneumothorax. Remains left lung base opacification, which may reflect combination atelectasis/consolidation and residual pleural fluid. Increased right basilar atelectasis. Electronically Signed   By: Macy Mis M.D.   On: 05/17/2021 11:55   US THORACENTESIS ASP PLEURAL SPACE W/IMG GUIDE  Result Date: 05/17/2021 INDICATION: Shortness of breath with left pleural effusion, concern for infection. EXAM: ULTRASOUND GUIDED LEFT THORACENTESIS MEDICATIONS: Local 1% lidocaine only. COMPLICATIONS: None immediate. PROCEDURE: An ultrasound guided thoracentesis was thoroughly discussed with the patient and questions answered. The benefits, risks, alternatives and complications were also discussed. The patient understands and wishes to proceed with the procedure. Written consent was obtained. Ultrasound was performed to localize and mark an adequate pocket of fluid in the left chest. The area was then prepped and draped in the normal sterile fashion. 1% Lidocaine was used for local anesthesia. Under ultrasound guidance a 19 gauge, 7-cm, Yueh catheter was introduced. Thoracentesis was performed. The catheter was removed and a dressing applied. FINDINGS: A total of approximately 700 mL of blood tinged fluid was removed. Samples were sent to the laboratory as requested by the clinical team. IMPRESSION: Successful ultrasound guided left thoracentesis yielding 700 mL of pleural fluid. Read By: Tsosie Billing PA-C  Electronically Signed   By: Albin Felling M.D.   On: 05/17/2021 11:24    Scheduled Meds:  acetaminophen  650 mg Oral TID   acetylcysteine  2 mL Nebulization STAT   aspirin EC  81 mg Oral Daily   atorvastatin  80 mg Oral QHS   calcitRIOL  0.25 mcg Oral Daily   carvedilol  12.5 mg Oral BID WC   Chlorhexidine Gluconate Cloth  6 each Topical Daily   cloNIDine  0.1 mg Oral TID   clopidogrel  75 mg Oral Daily   dorzolamide-timolol  1 drop Both Eyes BID   epoetin (EPOGEN/PROCRIT) injection  3,000 Units Subcutaneous Q14 Days   escitalopram  5 mg Oral Daily   ezetimibe  10 mg Oral Daily   feeding supplement  237 mL Oral TID BM   fluticasone furoate-vilanterol  1 puff Inhalation Daily   And   umeclidinium bromide  1 puff Inhalation Daily   gabapentin  100 mg Oral BID   guaiFENesin  1,200 mg Oral BID   [START ON 05/18/2021] heparin injection (subcutaneous)  5,000 Units Subcutaneous Q8H   hydrALAZINE  10 mg Oral TID   insulin aspart  0-20 Units Subcutaneous TID WC   insulin aspart  0-5 Units Subcutaneous QHS   insulin aspart  3 Units Subcutaneous TID WC   insulin detemir  30 Units Subcutaneous QHS   isosorbide mononitrate  120 mg Oral Daily   levothyroxine  50 mcg Oral Q0600   montelukast  10 mg Oral Daily   multivitamin with minerals  1 tablet Oral Daily   pantoprazole  40 mg Oral Daily   sodium chloride flush  3 mL Intravenous Q12H   sodium chloride HYPERTONIC  4 mL Nebulization BID   tamsulosin  0.4 mg Oral QHS   cyanocobalamin  1,000 mcg Oral Daily    Continuous Infusions:  sodium chloride 10 mL/hr at 05/13/21 1614   meropenem (MERREM) IV 500 mg (05/17/21 1511)     LOS: 14 days     Kayleen Memos, MD  [facility] Pager [phone]  If 7PM-7AM, please contact night-coverage [email or web address] Password [email or web address] 05/17/2021, 4:16 PM

## 2021-05-17 NOTE — Progress Notes (Signed)
Critical result of COVID positive. Paged on call provider Ouma, ARNP to make aware

## 2021-05-17 NOTE — Progress Notes (Signed)
Hypertonic Saline and Mucomyst not available to give.

## 2021-05-17 NOTE — Progress Notes (Signed)
PT Cancellation Note  Patient Details Name: Stephen [name] MRN: [id number] DOB: 09/24/1945   Cancelled Treatment:     Pt unable to tolerate any activity at this time per nursing. Will attempt again at a later date/time if appropriate.    Josie Dixon 05/17/2021, 3:26 PM

## 2021-05-17 NOTE — Consult Note (Signed)
NAME: Stephen [name]  DOB: 01/02/1946  MRN: [id number]  Date/Time: 05/17/2021 1:37 PM  REQUESTING PROVIDER: Dr.Hall Subjective:  REASON FOR CONSULT: pneumonia No history available from patient, chart reviewed and spoke to wife and daughter at bed side? Stephen [name] is a 75 y.o. with a history of CHF, CAD, pulmonary HTN, RT heart failure, lung fibrosis ( had wedge biopsies) presented to the ED on 10/31 /22 with SOB and chest pain Prior to that patient was in the hospital 04/17/21-04/30/21 with similar problem and was diagnosed with NSTEMI, Acute on chronic hypoxic hypercapnea, AKI, acute metabolic encephalopathy which was thought to be due to UTI and he received ceftriaxone and treated with diuretics, BIPAP  Pt went home on 04/30/21 only to return in 48 hrs- He was diagnosed to have CAD, unstable angina NSTEMI, Acute on chronic resp failure, CHF superimposed on COPD, OSA, ILD, PAH.  He has BPH and was on tamsulosin which was staopped  last admission- on 11/6 he had urinary retention and straight cath yielded bloody urine, he had coude place don 11/6 and started on IV ceftriaxone He was seen by urologist .  Patient has been more sleepy over the last few days and on 05/15/2021 he had a low-grade fever of 100.6.  He was started on meropenem. His mental status improved yesterday but today has become tender again.  CT of the chest done today shows active lung collapse/atelectasis with pleural effusion He underwent pleurocentesis and 700 cc of pleural fluid was aspirated. Cell count was 921 with 24% neutrophils Simplex P glucoside's and monocytes.  The glucose was 226 and LDL was 121.  Albumin was less than 1.5. Am asked to see the patient for possible pneumonia. Past Medical History:  Diagnosis Date   Adrenal gland anomaly    Arthritis    CAD (coronary artery disease)    a. 04/2006 MI and PCI/stenting to mLCx & mRCA; b. 07/2009 Cath: patent LCX/RCA stents;  c. 12/2016 NSTEMI/Cath: LM 30, LAD 30p,  31m, 30d, D1/2/3 min irregs, LCX 128m ISR, OM1 min irregs, RCA 20p, 10 ISR, 39m, 50d, RPDA min irregs, RPAV 40-->Med Rx; d. 12/2017 MV: fixed lateral wall scar, mild anterior/anterior septal ischemia. EF 30-44%.   CHF (congestive heart failure) (HCC)    CKD (chronic kidney disease), stage III (HCC)    Colon polyps    DM2 (diabetes mellitus, type 2) (HCC)    insulin requiring   Dyslipidemia    GERD (gastroesophageal reflux disease)    History of echocardiogram    a. TTE 12/2016: EF 50-55%, mod concentric LVH, images inadequate for wall motion assessment, not technically sufficienct to allow for LV dias fxn, calcified mitral annulus    HTN (hypertension)    Kidney stones    Left Renal artery stenosis ([place])    a. 07/2013 s/p PTA/stenting ([place]); b. 07/2017 Renal Duplex: No significant RAS.   Myocardial infarction (HCC)    OSA (obstructive sleep apnea)    Pulmonary fibrosis ([place])    a. 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   Recurrent UTI    Rotator cuff disorder    right   Skin cancer    head   Thyroid disorder    Venous insufficiency of both lower extremities    a. s/p laser treatment.    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   CARPAL TUNNEL RELEASE     bilateral  CATARACT EXTRACTION W/PHACO Right 07/08/2018   Procedure: CATARACT EXTRACTION PHACO AND INTRAOCULAR LENS PLACEMENT ([place]) RIGHT DIABETIC;  Surgeon: Eulogio Bear, MD;  Location: [facility];  Service: Ophthalmology;  Laterality: Right;  Diabetic - insulin sleep apnea   CATARACT EXTRACTION W/PHACO Left 08/05/2018   Procedure: CATARACT EXTRACTION PHACO AND INTRAOCULAR LENS PLACEMENT ([place]) LEFT DIABETIC;  Surgeon: Eulogio Bear, MD;  Location: [facility];  Service: Ophthalmology;  Laterality: Left;  DIABETIC   CORONARY ANGIOGRAPHY N/A 08/19/2020   Procedure: CORONARY ANGIOGRAPHY;  Surgeon: Minna Merritts, MD;  Location: [facility];  Service: Cardiovascular;  Laterality: N/A;   CORONARY ANGIOPLASTY WITH STENT PLACEMENT  2007   x 2   KNEE SURGERY     right   LEFT HEART CATH AND CORONARY ANGIOGRAPHY N/A 01/11/2017   Procedure: Left Heart Cath and Coronary Angiography;  Surgeon: Wellington Hampshire, MD;  Location: [facility];  Service: Cardiovascular;  Laterality: N/A;   LUNG BIOPSY     RENAL ARTERY STENT  2015   L   RIGHT HEART CATH Right 12/06/2020   Procedure: RIGHT HEART CATH;  Surgeon: Wellington Hampshire, MD;  Location: [facility];  Service: Cardiovascular;  Laterality: Right;   SKIN CANCER EXCISION     back of head    Social History   Socioeconomic History   Marital status: Married    Spouse name: Not on file   Number of children: 2   Years of education: Not on file   Highest education level: Not on file  Occupational History   Occupation: retired from service station work, 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    Comment: significant metal dust exposure, significant asbestos exposure    Employer: DISABLED  Tobacco Use   Smoking status: Never   Smokeless tobacco: Never  Vaping Use   Vaping Use: Never used  Substance and Sexual Activity   Alcohol use: No    Alcohol/week: 0.0 standard drinks   Drug use: No   Sexual activity: Yes  Other Topics Concern   Not on file  Social History Narrative   Enjoys fishing   Personnel officer   Social Determinants of Health   Financial Resource Strain: Low Risk    Difficulty of Paying Living Expenses: Not hard at all  Food Insecurity: No Food Insecurity   Worried About Charity fundraiser in the Last Year: Never true   Arboriculturist in the Last Year: Never true  Transportation Needs: No Transportation Needs   Lack of Transportation (Medical): No   Lack of Transportation (Non-Medical): No  Physical Activity: Inactive   Days of Exercise per Week: 0 days   Minutes of Exercise per Session: 0 min  Stress: No Stress  Concern Present   Feeling of Stress : Not at all  Social Connections: Not on file  Intimate Partner Violence: Not At Risk   Fear of Current or Ex-Partner: No   Emotionally Abused: No   Physically Abused: No   Sexually Abused: No    Family History  Problem Relation Age of Onset   Heart attack Father    Heart attack Mother    Heart attack Brother    COPD Sister    Arthritis Maternal Grandmother    Breast cancer Maternal Aunt    Diabetes Maternal Aunt    Hypertension Other    Hypertension Maternal Aunt    Prostate cancer Neg Hx    Kidney cancer Neg Hx  Bladder Cancer Neg Hx    Colon cancer Neg Hx    Allergies  Allergen Reactions   Calcium Channel Blockers     Would avoid if possible due to h/o peripheral edema   I? Current Facility-Administered Medications  Medication Dose Route Frequency Provider Last Rate Last Admin   0.9 %  sodium chloride infusion   Intravenous PRN Sharion Settler, NP 10 mL/hr at 05/13/21 1614 Infusion Verify at 05/13/21 1614   acetaminophen (TYLENOL) tablet 650 mg  650 mg Oral TID Earlie Counts, NP   650 mg at 05/17/21 0947   acetylcysteine (MUCOMYST) 20 % nebulizer / oral solution 2 mL  2 mL Nebulization STAT Hall, Carole N, DO       albuterol (PROVENTIL) (2.5 MG/3ML) 0.083% nebulizer solution 2.5 mg  2.5 mg Inhalation Q6H PRN Loletha Grayer, MD   2.5 mg at 05/08/21 2158   ALPRAZolam Duanne Moron) tablet 0.5 mg  0.5 mg Oral TID PRN Shelly Coss, MD   0.5 mg at 05/16/21 2322   aspirin EC tablet 81 mg  81 mg Oral Daily Athena Masse, MD   81 mg at 05/17/21 0946   atorvastatin (LIPITOR) tablet 80 mg  80 mg Oral QHS Athena Masse, MD   80 mg at 05/16/21 2101   calcitRIOL (ROCALTROL) capsule 0.25 mcg  0.25 mcg Oral Daily Loletha Grayer, MD   0.25 mcg at 05/17/21 0948   carvedilol (COREG) tablet 12.5 mg  12.5 mg Oral BID WC Hall, Carole N, DO   12.5 mg at 05/17/21 7858   Chlorhexidine Gluconate Cloth 2 % PADS 6 each  6 each Topical Daily Cristal Deer, MD   6 each at 05/17/21 0958   cloNIDine (CATAPRES) tablet 0.1 mg  0.1 mg Oral TID End, Harrell Gave, MD   0.1 mg at 05/17/21 0947   clopidogrel (PLAVIX) tablet 75 mg  75 mg Oral Daily Loletha Grayer, MD   75 mg at 05/17/21 0947   dorzolamide-timolol (COSOPT) 22.3-6.8 MG/ML ophthalmic solution 1 drop  1 drop Both Eyes BID Loletha Grayer, MD   1 drop at 05/17/21 0956   epoetin alfa (EPOGEN) injection 3,000 Units  3,000 Units Subcutaneous Q14 Days Liana Gerold, MD   3,000 Units at 05/09/21 0609   escitalopram (LEXAPRO) tablet 5 mg  5 mg Oral Daily Clapacs, Madie Reno, MD   5 mg at 05/17/21 0947   ezetimibe (ZETIA) tablet 10 mg  10 mg Oral Daily Loletha Grayer, MD   10 mg at 05/17/21 0946   feeding supplement (ENSURE ENLIVE / ENSURE PLUS) liquid 237 mL  237 mL Oral TID BM Hall, Carole N, DO   237 mL at 05/17/21 0957   fluticasone furoate-vilanterol (BREO ELLIPTA) 200-25 MCG/ACT 1 puff  1 puff Inhalation Daily Loletha Grayer, MD   1 puff at 05/17/21 0954   And   umeclidinium bromide (INCRUSE ELLIPTA) 62.5 MCG/ACT 1 puff  1 puff Inhalation Daily Loletha Grayer, MD   1 puff at 05/17/21 0955   gabapentin (NEURONTIN) capsule 100 mg  100 mg Oral BID Loletha Grayer, MD   100 mg at 05/17/21 0947   [START ON 05/18/2021] heparin injection 5,000 Units  5,000 Units Subcutaneous Q8H Hall, Carole N, DO       hydrALAZINE (APRESOLINE) tablet 10 mg  10 mg Oral TID Irene Pap N, DO       HYDROcodone-acetaminophen (NORCO/VICODIN) 5-325 MG per tablet 1 tablet  1 tablet Oral Q6H PRN Shelly Coss, MD  1 tablet at 05/14/21 1204   insulin aspart (novoLOG) injection 0-20 Units  0-20 Units Subcutaneous TID WC Athena Masse, MD   11 Units at 05/17/21 1336   insulin aspart (novoLOG) injection 0-5 Units  0-5 Units Subcutaneous QHS Athena Masse, MD   3 Units at 05/16/21 2101   insulin aspart (novoLOG) injection 3 Units  3 Units Subcutaneous TID WC Val Riles, MD   3 Units at 05/16/21 1708    insulin detemir (LEVEMIR) injection 30 Units  30 Units Subcutaneous QHS Loletha Grayer, MD   30 Units at 05/16/21 2102   isosorbide mononitrate (IMDUR) 24 hr tablet 120 mg  120 mg Oral Daily Irene Pap N, DO   120 mg at 05/17/21 0946   levothyroxine (SYNTHROID) tablet 50 mcg  50 mcg Oral Q0600 Athena Masse, MD   50 mcg at 05/17/21 0516   meropenem (MERREM) 500 mg in sodium chloride 0.9 % 100 mL IVPB  500 mg Intravenous Q12H Lorna Dibble, RPH 200 mL/hr at 05/17/21 0151 500 mg at 05/17/21 0151   montelukast (SINGULAIR) tablet 10 mg  10 mg Oral Daily Loletha Grayer, MD   10 mg at 05/17/21 0947   multivitamin with minerals tablet 1 tablet  1 tablet Oral Daily Irene Pap N, DO   1 tablet at 05/17/21 0947   nitroGLYCERIN (NITROSTAT) SL tablet 0.4 mg  0.4 mg Sublingual Q5 min PRN Loletha Grayer, MD       ondansetron [facility]) injection 4 mg  4 mg Intravenous Q6H PRN Athena Masse, MD   4 mg at 05/15/21 0808   pantoprazole (PROTONIX) EC tablet 40 mg  40 mg Oral Daily Loletha Grayer, MD   40 mg at 05/17/21 0947   polyethylene glycol (MIRALAX / GLYCOLAX) packet 17 g  17 g Oral Daily PRN Loletha Grayer, MD   17 g at 05/06/21 2101   sodium chloride flush (NS) 0.9 % injection 3 mL  3 mL Intravenous Q12H Shawna Clamp, MD   3 mL at 05/17/21 0957   tamsulosin (FLOMAX) capsule 0.4 mg  0.4 mg Oral QHS Loletha Grayer, MD   0.4 mg at 05/16/21 2100   vitamin B-12 (CYANOCOBALAMIN) tablet 1,000 mcg  1,000 mcg Oral Daily Loletha Grayer, MD   1,000 mcg at 05/17/21 0947     Abtx:  Anti-infectives (From admission, onward)    Start     Dose/Rate Route Frequency Ordered Stop   05/16/21 0215  meropenem (MERREM) 500 mg in sodium chloride 0.9 % 100 mL IVPB        500 mg 200 mL/hr over 30 Minutes Intravenous Every 12 hours 05/16/21 0206 05/19/21 0214   05/08/21 2000  cefTRIAXone (ROCEPHIN) 1 g in sodium chloride 0.9 % 100 mL IVPB        1 g 200 mL/hr over 30 Minutes Intravenous Every 24 hours  05/08/21 1817 05/12/21 2315   04/13/2021 1000  doxycycline (VIBRA-TABS) tablet 100 mg        100 mg Oral Every 12 hours 04/28/2021 0725 05/05/21 2123       REVIEW OF SYSTEMS:  NA Objective:  VITALS:  BP (!) 106/54   Pulse (!) 58   Temp (!) 97.5 F (36.4 C)   Resp 16   Ht 5\' 5"  (1.651 m)   Wt 110.2 kg   SpO2 99%   BMI 40.44 kg/m  PHYSICAL EXAM:  General: Somnolent, obtunded, has  BiPAP  head: Normocephalic, without obvious abnormality, atraumatic. Eyes: Conjunctivae  clear, anicteric sclerae. Pupils are equal ENT cannot be examined Neck:  symmetrical, no adenopathy, thyroid: non tender no carotid bruit and no JVD.  Lungs: Bilateral air entry. Spaces Decreased on the left side Heart: Irregular Abdomen: Soft, non-tendeegular rate and rhythm, nr,not distended. Bowel sounds normal. No masses Extremities: Varicosities of the extremities Cannot be skin: No rashes or lesions. Or bruising Lymph: Cervical, supraclavicular normal. Neurologic: Cannot be assessed Pertinent Labs Lab Results CBC    Component Value Date/Time   WBC 15.1 (H) 05/17/2021 0442   RBC 2.81 (L) 05/17/2021 0442   HGB 7.6 (L) 05/17/2021 0442   HGB 13.5 08/12/2020 1120   HCT 25.8 (L) 05/17/2021 0442   HCT 40.8 08/12/2020 1120   PLT 290 05/17/2021 0442   PLT 195 08/12/2020 1120   MCV 91.8 05/17/2021 0442   MCV 87 08/12/2020 1120   MCH 27.0 05/17/2021 0442   MCHC 29.5 (L) 05/17/2021 0442   RDW 16.5 (H) 05/17/2021 0442   RDW 13.7 08/12/2020 1120   LYMPHSABS 0.9 05/17/2021 0442   LYMPHSABS 0.8 08/12/2020 1120   MONOABS 1.4 (H) 05/17/2021 0442   EOSABS 0.3 05/17/2021 0442   EOSABS 0.3 08/12/2020 1120   BASOSABS 0.1 05/17/2021 0442   BASOSABS 0.1 08/12/2020 1120    CMP Latest Ref Rng & Units 05/17/2021 05/16/2021 05/15/2021  Glucose 70 - 99 mg/dL 191(H) 143(H) 253(H)  BUN 8 - 23 mg/dL 105(H) 84(H) 88(H)  Creatinine 0.61 - 1.24 mg/dL 4.48(H) 3.93(H) 3.25(H)  Sodium 135 - 145 mmol/L 136 136 137   Potassium 3.5 - 5.1 mmol/L 3.6 3.6 3.5  Chloride 98 - 111 mmol/L 92(L) 91(L) 92(L)  CO2 22 - 32 mmol/L 34(H) 37(H) 36(H)  Calcium 8.9 - 10.3 mg/dL 8.1(L) 8.3(L) 8.5(L)  Total Protein 6.5 - 8.1 g/dL 6.8 - -  Total Bilirubin 0.3 - 1.2 mg/dL 0.9 - -  Alkaline Phos 38 - 126 U/L 70 - -  AST 15 - 41 U/L 42(H) - -  ALT 0 - 44 U/L 58(H) - -   Microbiology 04/25/2021 blood cultures no growth 05/17/2021 pleural fluid culture has been sent  ABG pH 7.43 PCO2 62 PO2 107 Bicarb 41  IMAGING RESULTS:  I have personally reviewed the films CT chest reveals complete atelectasis of the left lung possibly due to mucous plugging.  Small pleural effusions present.  No pneumothorax. Remains left lung base opacification, which may reflect combination atelectasis/consolidation and residual pleural fluid. Increased right basilar atelectasis.   Impression/Recommendation ? Encephalopathy.  Fluctuating.  Likely due to CO2 narcosis.  Acute respiratory failure on chronic hypoxic and hypercarbic respiratory failure.  On BiPAP Left lung atelectasis and lower lobe consolidation very likely could be from mucous plugging.  After aspiration of pleural fluid the lung is expanded a little. If fever persist or if leukocytosis increases in spite of meropenem patient will need bronchoscopy to clear the mucous plug. Will check MRSA nares May change meropenem to Unasyn.  CHF  Interstitial lung disease COPD OSA Pulmonary hypertension  No evidence of urinary tract infection currently BPH with urinary retention.  Has a Foley catheter.  Received 6 days of IV ceftriaxone 10 days ago. ? ___________________________________________________ Discussed with patient, requesting provider Note:  This document was prepared using Dragon voice recognition software and may include unintentional dictation errors.

## 2021-05-17 NOTE — Consult Note (Signed)
NAME:  Stephen [name], MRN:  [id number], DOB:  10/18/1945, LOS: 70 ADMISSION DATE:  04/11/2021, CONSULTATION DATE:  05/17/2021 REFERRING MD:  Dr. Nevada Crane, CHIEF COMPLAINT:  Near complete Atelectasis of the Left Lung   Brief Pt Description / Synopsis:  75 year old male admitted with acute on chronic hypoxic and hypercapnic respiratory failure in the setting of acute decompensated HFpEF, pulmonary hypertension, and interstitial lung disease.  PCCM asked to see for pulmonary consult on 05/17/2021 due to near complete atelectasis of the left lung.  History of Present Illness:  Stephen [name] is a 75 year old male with a past medical history most significant for interstitial lung disease, pulmonary hypertension, obstructive sleep apnea on CPAP, chronic hypoxic and hypercapnic respiratory failure on 4 L supplemental O2 at baseline,, and CKD stage IV who presented to [facility] ED on 04/20/2021 due to complaints of chest pain and shortness of breath. Of note the patient was recently hospitalized from 04/17/2021 through 04/30/2021 for non-STEMI and CHF exacerbation.  Just 2 days after discharge he had recurrent chest pain and dyspnea prompting his wife to call EMS.  He has required admission by the hospitalist for treatment of acute on chronic hypoxic & hypercapnic respiratory failure in the setting of acute decompensated HFpEF requiring diuresis.  With diuresis, clinically the patient had been improving with negative fluid balances, along with  improvement in dyspnea and oxygen requirements.  Hospital Course: Hospitalization has been complicated by urinary retention and hematuria requiring placement of Foley catheter by urology, as well as acute metabolic encephalopathy attributed to hypercapnia.  On 05/16/2021 he became febrile with a T max of 100.7.  Urinalysis was positive for pyuria.  There was also concern for possible parapneumonic effusion.  He was subsequently placed on meropenem.  On 05/17/2021 CT the  chest was concerning for almost complete atelectasis of the left lung (possibly attributed to mucous plugging), small left pleural effusion.  He underwent left-sided thoracentesis by IR which yielded 700 mL of blood-tinged fluid.  Specimens from the thoracentesis have ben sent for analysis and cultures.    PCCM was consulted for further evaluation of atelectasis.   Pertinent  Medical History  HFpEF Obstructive sleep apnea Pulmonary fibrosis Coronary artery disease Myocardial infarction Chronic kidney disease stage III Left renal artery stenosis Diabetes mellitus type 2 Hypertension Hyperlipidemia Recurrent UTIs Peripheral vascular disease  Micro Data:  04/24/2021: SARS-CoV-2 and influenza PCR>> negative 05/09/2021: Hepatitis B surface antigen>> non-reactive 05/15/2021: Urine culture>> 05/17/2021: Pleural fluid>>  Antimicrobials:  Ceftriaxone 11/6>> 11/10 Doxycycline 10/31>> 11/3 Meropenem 11/14>>  Significant Hospital Events: Including procedures, antibiotic start and stop dates in addition to other pertinent events   04/26/2021: Presented to ED ED with chest pain and shortness of breath (just 2 days after prior admission).  Admitted by hospitalist for treatment of acute CHF exacerbation 05/16/2021: New fever.  UA with pyuria and concern for parapneumonic effusion, placed on meropenem 05/17/2021: CT chest with concern for near complete atelectasis of the left lung.  Underwent thoracentesis with removal of 700 cc of blood-tinged fluid.  PCCM contacted for pulmonary consult  Interim History / Subjective:  -CT of the chest earlier this morning with concern for near-total atelectasis of the left lung -Since then underwent left thoracentesis with removal of approximately 700 cc of blood-tinged fluid ~pleural fluid sent for further analysis and cultures of which results are pending -PCCM asked to see for pulmonary consult -Patient is currently sleeping and somnolent while on 5 L  nasal cannula -Plan to place back on  BiPAP and obtain ABG  Objective   Blood pressure (!) 106/54, pulse (!) 58, temperature (!) 97.5 F (36.4 C), resp. rate 16, height $RemoveBe'5\' 5"'FIVHoMrxS$  (1.651 m), weight 110.2 kg, SpO2 99 %.        Intake/Output Summary (Last 24 hours) at 05/17/2021 1324 Last data filed at 05/17/2021 0500 Gross per 24 hour  Intake 480.11 ml  Output 800 ml  Net -319.89 ml   Filed Weights   05/10/21 0310 05/11/21 1021 05/17/21 0600  Weight: 117.5 kg 110.3 kg 110.2 kg    Examination: General: Acute on chronically ill-appearing male, laying in bed, on 5 L nasal cannula, no acute distress HENT: Atraumatic, normocephalic, neck supple, no JVD Lungs: Coarse breath sounds bilaterally, even, nonlabored, normal effort Cardiovascular: Regular rate and rhythm, S1-S2, no murmurs, rubs, gallops Abdomen: Obese, soft, nontender, nondistended, no guarding rebound tenderness, bowel sounds positive x4 Extremities: No deformities, trace edema to bilateral lower extremities Neuro: Somnolent, will arouse to voice and gentle stimulation however falls back asleep, pupils PERRLA GU: Foley catheter in place draining tea colored urine  Resolved Hospital Problem list     Assessment & Plan:   Acute on Chronic Hypoxic & Hypercapnic Respiratory Failure in the setting of Acute Decompensated HFpEF, Pulmonary Hypertension, and Insterstitial Lung Disease ? Parapneumonic effusion Newly found almost complete Atelectasis of the Left Lung on 11/15 (Unclear if Obstructive due to ? Mucus plugging vs. Compressive due to Pleural Effusion) -Supplemental O2 as needed to maintain O2 sats >90% -BiPAP qhs, as well as anytime while sleeping during the day and PRN -Follow intermittent CXR & ABG as needed ~ will repeat CXR in AM 11/16 -Diuresis as BP and renal function permits ~ currently holding due to worsening AKI ~ Nephrology following -PRN Bronchodilators -Continue Breo Ellipta, Incruse Ellipta, Singulair -S/p  Thoracentesis on 11/15 with approximately 700 cc of blood tinged fluid drained ~ pleural fluid sent for analysis and culture ~ results pending -Ensure adequate pulmonary hygiene: Incentive Spirometry, Flutter valve, Mobilize as able -Mucomyst neb x1 -Continue Meropenem for now pending cultures and sensitivities    Best Practice (right click and "Reselect all SmartList Selections" daily)   Diet/type: NPO DVT prophylaxis: prophylactic heparin  GI prophylaxis: PPI Lines: N/A Foley:  Yes, and it is still needed Code Status:  DNR Last date of multidisciplinary goals of care discussion [N/A]  Pt's wife and daughter were updated at bedside by both Dr. [place] Bells and myself.  All questions answered to their satisfaction.  They both confirm DNR/DNI status.  Labs   CBC: Recent Labs  Lab 05/11/21 0422 05/12/21 0429 05/13/21 0524 05/14/21 0601 05/15/21 2055 05/17/21 0442  WBC 3.9* 4.3 4.6 4.1 12.4* 15.1*  NEUTROABS 2.6 2.7  --   --  10.6* 12.4*  HGB 8.4* 8.5* 8.5* 7.9* 8.4* 7.6*  HCT 27.6* 27.7* 27.3* 25.8* 27.4* 25.8*  MCV 88.7 89.6 88.9 89.0 87.8 91.8  PLT 246 266 263 274 312 962    Basic Metabolic Panel: Recent Labs  Lab 05/11/21 0422 05/12/21 0428 05/12/21 1017 05/13/21 0524 05/14/21 0601 05/15/21 0449 05/16/21 0516 05/17/21 0442  NA 137 136  --  137  139 140 137 136 136  K 3.5 3.2*  --  3.5  3.6 3.4* 3.5 3.6 3.6  CL 96* 94*  --  94*  96* 93* 92* 91* 92*  CO2 33* 34*  --  34*  35* 37* 36* 37* 34*  GLUCOSE 215* 162*  --  193*  192* 201* 253* 143*  191*  BUN 85* 86*  --  82*  90* 93* 88* 84* 105*  CREATININE 3.64* 3.25*  --  3.19*  3.29* 3.20* 3.25* 3.93* 4.48*  CALCIUM 8.0* 7.7*  --  8.0*  8.1* 8.4* 8.5* 8.3* 8.1*  MG  --   --  2.0 2.3 2.1  --  2.3  --   PHOS 5.5* 4.8*  --  3.4  3.7 3.9  4.1 3.6  --   --    GFR: Estimated Creatinine Clearance: 16.8 mL/min (A) (by C-G formula based on SCr of 4.48 mg/dL (H)). Recent Labs  Lab 05/13/21 0524 05/14/21 0601  05/15/21 2055 05/17/21 0442  PROCALCITON  --   --  0.16 0.66  WBC 4.6 4.1 12.4* 15.1*    Liver Function Tests: Recent Labs  Lab 05/12/21 0428 05/13/21 0524 05/14/21 0601 05/15/21 0449 05/17/21 0442  AST  --   --   --   --  42*  ALT  --   --   --   --  58*  ALKPHOS  --   --   --   --  70  BILITOT  --   --   --   --  0.9  PROT  --   --   --   --  6.8  ALBUMIN 1.9* 1.9* 2.0* 2.1* 1.8*   No results for input(s): LIPASE, AMYLASE in the last 168 hours. No results for input(s): AMMONIA in the last 168 hours.  ABG    Component Value Date/Time   PHART 7.43 05/17/2021 1232   PCO2ART 62 (H) 05/17/2021 1232   PO2ART 107 05/17/2021 1232   HCO3 41.2 (H) 05/17/2021 1232   TCO2 25 08/31/2010 1529   O2SAT 98.3 05/17/2021 1232     Coagulation Profile: No results for input(s): INR, PROTIME in the last 168 hours.  Cardiac Enzymes: No results for input(s): CKTOTAL, CKMB, CKMBINDEX, TROPONINI in the last 168 hours.  HbA1C: Hgb A1c MFr Bld  Date/Time Value Ref Range Status  04/05/2021 06:51 AM 7.5 (H) 4.8 - 5.6 % Final    Comment:    (NOTE) Pre diabetes:          5.7%-6.4%  Diabetes:              >6.4%  Glycemic control for   <7.0% adults with diabetes   02/21/2021 02:44 PM 6.7 (H) 4.6 - 6.5 % Final    Comment:    Glycemic Control Guidelines for People with Diabetes:Non Diabetic:  <6%Goal of Therapy: <7%Additional Action Suggested:  >8%     CBG: Recent Labs  Lab 05/16/21 1117 05/16/21 1644 05/16/21 2017 05/17/21 0755 05/17/21 1146  GLUCAP 156* 133* 265* 204* 266*    Review of Systems:   Unable to assess due to AMS   Past Medical History:  He,  has a past medical history of Adrenal gland anomaly, Arthritis, CAD (coronary artery disease), CHF (congestive heart failure) ([place]), CKD (chronic kidney disease), stage III ([place]), Colon polyps, DM2 (diabetes mellitus, type 2) ([place]), Dyslipidemia, GERD (gastroesophageal reflux disease), History of echocardiogram, HTN  (hypertension), Kidney stones, Left Renal artery stenosis (HCC), Myocardial infarction ([place]), OSA (obstructive sleep apnea), Pulmonary fibrosis ([place]), Recurrent UTI, Rotator cuff disorder, Skin cancer, Thyroid disorder, and Venous insufficiency of both lower extremities.   Surgical History:   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   CARPAL TUNNEL RELEASE     bilateral   CATARACT  EXTRACTION W/PHACO Right 07/08/2018   Procedure: CATARACT EXTRACTION PHACO AND INTRAOCULAR LENS PLACEMENT ([place]) RIGHT DIABETIC;  Surgeon: Eulogio Bear, MD;  Location: [place];  Service: Ophthalmology;  Laterality: Right;  Diabetic - insulin sleep apnea   CATARACT EXTRACTION W/PHACO Left 08/05/2018   Procedure: CATARACT EXTRACTION PHACO AND INTRAOCULAR LENS PLACEMENT ([place]) LEFT DIABETIC;  Surgeon: Eulogio Bear, MD;  Location: [facility];  Service: Ophthalmology;  Laterality: Left;  DIABETIC   CORONARY ANGIOGRAPHY N/A 08/19/2020   Procedure: CORONARY ANGIOGRAPHY;  Surgeon: Minna Merritts, MD;  Location: [facility];  Service: Cardiovascular;  Laterality: N/A;   CORONARY ANGIOPLASTY WITH STENT PLACEMENT  2007   x 2   KNEE SURGERY     right   LEFT HEART CATH AND CORONARY ANGIOGRAPHY N/A 01/11/2017   Procedure: Left Heart Cath and Coronary Angiography;  Surgeon: Wellington Hampshire, MD;  Location: [place] [facility];  Service: Cardiovascular;  Laterality: N/A;   LUNG BIOPSY     RENAL ARTERY STENT  2015   L   RIGHT HEART CATH Right 12/06/2020   Procedure: RIGHT HEART CATH;  Surgeon: Wellington Hampshire, MD;  Location: [place] [facility];  Service: Cardiovascular;  Laterality: Right;   SKIN CANCER EXCISION     back of head     Social History:   reports that he has never smoked. He has never used smokeless tobacco. He reports that he does not drink alcohol and does not use drugs.   Family History:  His family history includes  Arthritis in his maternal grandmother; Breast cancer in his maternal aunt; COPD in his sister; Diabetes in his maternal aunt; Heart attack in his brother, father, and mother; Hypertension in his maternal aunt and another family member. There is no history of Prostate cancer, Kidney cancer, Bladder Cancer, or Colon cancer.   Allergies Allergies  Allergen Reactions   Calcium Channel Blockers     Would avoid if possible due to h/o peripheral edema     Home Medications  Prior to Admission medications   Medication Sig Start Date End Date Taking? Authorizing Provider  acetaminophen (TYLENOL) 500 MG tablet Take 1,000 mg by mouth every 6 (six) hours as needed.   Yes [provider]  aspirin EC 81 MG tablet Take 1 tablet (81 mg total) by mouth daily. Swallow whole. 11/25/20  Yes [name], MD  atorvastatin (LIPITOR) 80 MG tablet TAKE 1 TABLET AT BEDTIME 01/12/21  Yes [name], MD  calcitRIOL (ROCALTROL) 0.25 MCG capsule Take 0.25 mcg by mouth daily.   Yes [provider]  carvedilol (COREG) 25 MG tablet Take 1 tablet (25 mg total) by mouth 2 (two) times daily with a meal. 04/30/21  Yes Wieting, Richard, MD  cloNIDine (CATAPRES) 0.3 MG tablet TAKE 1 TABLET THREE TIMES DAILY 12/07/20  Yes Gollan, Kathlene November, MD  clopidogrel (PLAVIX) 75 MG tablet TAKE 1 TABLET EVERY DAY 09/29/20  Yes Loel Dubonnet, NP  dorzolamide-timolol (COSOPT) 22.3-6.8 MG/ML ophthalmic solution Place 1 drop into both eyes in the morning and at bedtime. 12/05/19  Yes [provider]  ezetimibe (ZETIA) 10 MG tablet TAKE 1 TABLET EVERY DAY 09/29/20  Yes Gollan, Kathlene November, MD  Fluticasone-Umeclidin-Vilant (TRELEGY ELLIPTA) 200-62.5-25 MCG/INH AEPB Inhale 1 puff into the lungs daily. 04/05/21  Yes Tyler Pita, MD  gabapentin (NEURONTIN) 100 MG capsule Take 1 capsule (100 mg total) by mouth 2 (two) times daily. 04/30/21  Yes Armona,  Richard, MD  hydrALAZINE (APRESOLINE) 100 MG tablet Take 1 tablet  (100 mg total) by mouth 3 (three) times daily. 04/30/21  Yes Wieting, Richard, MD  insulin aspart (NOVOLOG) 100 UNIT/ML FlexPen Inject 8 Units into the skin 3 (three) times daily with meals. (Okay to substitute generic or change to another short acting insulin that is covered) 04/30/21  Yes Wieting, Richard, MD  insulin detemir (LEVEMIR) 100 UNIT/ML FlexPen Inject 30 Units into the skin at bedtime. 04/30/21  Yes Wieting, Richard, MD  isosorbide mononitrate (IMDUR) 120 MG 24 hr tablet Take 1 tablet (120 mg total) by mouth daily. 05/01/21  Yes Loletha Grayer, MD  levothyroxine (SYNTHROID) 25 MCG tablet take 2 of the 25 mcg tablets Monday through Saturday with 1 tablet on Sunday.  13 pills/week. 02/23/21  Yes [name], MD  montelukast (SINGULAIR) 10 MG tablet Take 1 tablet by mouth once daily 04/05/21  Yes Tyler Pita, MD  nitroGLYCERIN (NITROSTAT) 0.4 MG SL tablet Place 1 tablet (0.4 mg total) under the tongue every 5 (five) minutes as needed. May repeat x3 03/27/19  Yes [name], MD  omeprazole (PRILOSEC) 20 MG capsule Take 20 mg by mouth daily before breakfast.   Yes [provider]  tamsulosin (FLOMAX) 0.4 MG CAPS capsule TAKE 1 CAPSULE AT BEDTIME 01/12/21  Yes [name], MD  torsemide (DEMADEX) 20 MG tablet Take 2 tablets (40 mg) by mouth once daily 12/14/20  Yes Visser, Jacquelyn D, PA-C  vitamin B-12 1000 MCG tablet Take 1 tablet (1,000 mcg total) by mouth daily. 05/01/21  Yes Wieting, Richard, MD  ACCU-CHEK AVIVA PLUS test strip USE AS INSTRUCTED TO CHECK BLOOD SUGAR 3 TIMES DAILY OR AS NEEDED 03/16/21   [name], MD  ACCU-CHEK SOFTCLIX LANCETS lancets Use as instructed to check blood sugar three times daily and as needed.  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 05/16/17   [name], MD  albuterol (VENTOLIN HFA) 108 (90 Base) MCG/ACT inhaler INHALE 2 PUFFS INTO THE LUNGS EVERY 6 HOURS AS NEEDED FOR WHEEZING OR SHORTNESS OF BREATH. DISPENSE 3 INHALERS  11/01/20   [name], MD  Alcohol Swabs (B-D SINGLE USE SWABS REGULAR) PADS USE TO CLEANS AREA PRIOR TO CHECKING BLOOD SUGAR THREE TIMES DAILY AND AS NEEDED 09/29/20   [name], MD  Blood Glucose Monitoring Suppl (ACCU-CHEK AVIVA PLUS) w/Device KIT Use to check blood sugar three times daily and as needed.  Diagnosis: E11.22  Insulin-dependent 07/01/19   [name], MD  fluticasone [facility]) 50 MCG/ACT nasal spray Place 2 sprays into both nostrils daily. 11/25/20   [name], MD  Insulin Pen Needle 34G X 3.5 MM MISC 1 Dose by Does not apply route 3 (three) times daily. 04/30/21   Loletha Grayer, MD  polyethylene glycol (MIRALAX / GLYCOLAX) 17 g packet Take 17 g by mouth daily as needed for moderate constipation. 04/30/21   Loletha Grayer, MD  sodium chloride (OCEAN) 0.65 % SOLN nasal spray Place 1 spray into both nostrils as needed for congestion. 04/30/21   Loletha Grayer, MD     Critical care time: 45 minutes     Darel Hong, AGACNP-BC [facility] Prefer epic messenger for cross cover needs If after hours, please call E-link

## 2021-05-17 NOTE — Progress Notes (Signed)
Attending:   I have seen and examined the patient independently with Darel Hong and agree with the assesment and plan in his note that we outlined together.  Subjective: This is a 75 y/o male who has been followed by my partner Dr. Patsey Berthold for asthma, ILD, chronic respiratory failure with hypoxemia and OSA presented to our hospital on 04/26/2021 with dyspnea and chest pain.  He had previously been hospitalized for an NSTEMI, complications from a UTI, and acute on chronic respiratory failure with hypoxemia due to congestive heart failure and was re-admitted on 10/31 2 days after hospital discharge.  On his return he was noted to have a CHF exacerbation and was treated with lasix, cardiology and nephrology have followed him.  He had a fever yesterday and was started on meropenem and plans were made for a thoracentesis due to a left pleural effusion.  He has been using NIMV at night (at home and during this admission).  His wife notes that he has been more drowsy in the last few days.  This morning he had a thoracentesis, PCCM was consulted due to complete collapse of his left lung seen on CT chest overnight. A CXR performed after this morning's thoracentesis showed improvement of the aeration of his left lung.  He is on 4L [place] O2 at home and has been on 5L PM this morning.  His lasix was held on 11/13 and nephrology has been considering hemodialysis as his renal function is worsening.  The patient stated previously that he did not want to go on life support (verified with his wife and daughter today) so his code status is DNR.  His wife notes increased cough in the last few days which is atypical for him.    Objective: Vitals:   05/17/21 0753 05/17/21 1035 05/17/21 1102 05/17/21 1148  BP: (!) 102/50 (!) 122/57 131/82 (!) 106/54  Pulse: (!) 55 (!) 58  (!) 58  Resp:      Temp: (!) 97.5 F (36.4 C)   (!) 97.5 F (36.4 C)  TempSrc: Oral     SpO2: 94% 100% 100% 99%  Weight:      Height:           Intake/Output Summary (Last 24 hours) at 05/17/2021 1435 Last data filed at 05/17/2021 0500 Gross per 24 hour  Intake 480.11 ml  Output 300 ml  Net 180.11 ml    General:  Somnolent, obese male resting comfortably in bed HENT: NCAT OP clear PULM: Few crackles bases B, normal effort CV: RRR, no mgr GI: BS+, soft, nontender MSK: normal bulk and tone Neuro: drowsy, opens eyes to voice but falls back asleep    CBC    Component Value Date/Time   WBC 15.1 (H) 05/17/2021 0442   RBC 2.81 (L) 05/17/2021 0442   HGB 7.6 (L) 05/17/2021 0442   HGB 13.5 08/12/2020 1120   HCT 25.8 (L) 05/17/2021 0442   HCT 40.8 08/12/2020 1120   PLT 290 05/17/2021 0442   PLT 195 08/12/2020 1120   MCV 91.8 05/17/2021 0442   MCV 87 08/12/2020 1120   MCH 27.0 05/17/2021 0442   MCHC 29.5 (L) 05/17/2021 0442   RDW 16.5 (H) 05/17/2021 0442   RDW 13.7 08/12/2020 1120   LYMPHSABS 0.9 05/17/2021 0442   LYMPHSABS 0.8 08/12/2020 1120   MONOABS 1.4 (H) 05/17/2021 0442   EOSABS 0.3 05/17/2021 0442   EOSABS 0.3 08/12/2020 1120   BASOSABS 0.1 05/17/2021 0442   BASOSABS 0.1 08/12/2020 1120  BMET    Component Value Date/Time   NA 136 05/17/2021 0442   NA 140 12/13/2020 1151   NA 139 07/21/2013 1007   K 3.6 05/17/2021 0442   K 3.8 07/21/2013 1007   CL 92 (L) 05/17/2021 0442   CL 106 07/21/2013 1007   CO2 34 (H) 05/17/2021 0442   CO2 26 07/21/2013 1007   GLUCOSE 191 (H) 05/17/2021 0442   GLUCOSE 97 07/21/2013 1007   BUN 105 (H) 05/17/2021 0442   BUN 28 (H) 12/13/2020 1151   BUN 27 (H) 07/21/2013 1007   CREATININE 4.48 (H) 05/17/2021 0442   CREATININE 1.79 (H) 07/21/2013 1007   CALCIUM 8.1 (L) 05/17/2021 0442   CALCIUM 9.2 07/21/2013 1007   GFRNONAA 13 (L) 05/17/2021 0442   GFRNONAA 39 (L) 07/21/2013 1007   GFRAA 28 (L) 08/12/2020 1120   GFRAA 45 (L) 07/21/2013 1007   ABG    Component Value Date/Time   PHART 7.43 05/17/2021 1232   PCO2ART 62 (H) 05/17/2021 1232   PO2ART 107 05/17/2021  1232   HCO3 41.2 (H) 05/17/2021 1232   TCO2 25 08/31/2010 1529   O2SAT 98.3 05/17/2021 1232     CXR images post thoracentesis reviewed: small left sided effusion, cardiomegally, improved aeration left lung   Impression/Plan: Chronic respiratory failure with hypoxemia and hypercarbia> We checked an ABG today, showed worsening hypercarbia but normal pH; Now steadily worsening despite balanced acid base status (pCO2 has been rising over last month, he has been more somnolent despite bipap use); continue BIPAP when sleeping, monitor respiratory status closely.  Baseline ILD (left greater than right, felt to be due to occupational exposure)> continue current management Fever, leukocytosis, increased cough: HCAP> abx per ID Parapneumonic effusion> f/u thoracentesis culture Left lung atelectasis> improved post thoracentesis, monitor CXR PRN (will check one tomorrow); could he have mucus plugging? Possibly but too sick for bronchoscopy, will add hypertonic saline, guafenesin Pulmonary hypertension/CHF> will need to consider hemodialysis, but is he a good long term candidate? Not sure he can get in and out of a dialysis chair by himself; recommend palliative care consultation Physical deconditioning> work with PT Acute metabolic encephalopathy due to hypercarbia> as above, hold sedation  Code status DNR> I explained to his wife and daughter that mechanical ventilation (invasive) would be unlikely to be medically beneficial as his overall condition has declined, he has baseline several severe chronic comorbid illnesses which have all decompensated (heart failure, kidney failure, respiratory failure), he is physically deconditioned, he has not improved despite aggressive therapy for a month, positive pressure from a ventilator would harm his ILD.  Recommend palliative care consultation.  My cc time 31 minutes  Roselie Awkward, MD [facility] [facility] Pager: [phone] Cell: [phone] After 7pm:  [phone]

## 2021-05-17 NOTE — Procedures (Signed)
PROCEDURE SUMMARY:  Successful US guided left thoracentesis. Yielded 700 mL of blood tinged fluid. Pt tolerated procedure well. No immediate complications.  Specimen was sent for labs. CXR ordered.  EBL < 1 mL  Tsosie Billing D PA-C 05/17/2021 11:00 AM

## 2021-05-17 NOTE — Progress Notes (Signed)
OT Cancellation Note  Patient Details Name: Stephen [name] MRN: [id number] DOB: Nov 22, 1945   Cancelled Treatment:    Reason Eval/Treat Not Completed: Fatigue/lethargy limiting ability to participate. Upon arrival pt sleeping soundly on bipap - family at bedside reporting pt fatigued requesting to let him rest. Will continue to follow POC as able. Plan to update goals next session.   Dessie Coma, M.S. OTR/L  05/17/21, 3:33 PM  ascom [phone]

## 2021-05-18 ENCOUNTER — Inpatient Hospital Stay: Payer: Medicare HMO

## 2021-05-18 DIAGNOSIS — I214 Non-ST elevation (NSTEMI) myocardial infarction: Secondary | ICD-10-CM | POA: Diagnosis not present

## 2021-05-18 DIAGNOSIS — R0902 Hypoxemia: Secondary | ICD-10-CM

## 2021-05-18 DIAGNOSIS — J9601 Acute respiratory failure with hypoxia: Secondary | ICD-10-CM | POA: Diagnosis not present

## 2021-05-18 DIAGNOSIS — N184 Chronic kidney disease, stage 4 (severe): Secondary | ICD-10-CM | POA: Diagnosis not present

## 2021-05-18 DIAGNOSIS — J90 Pleural effusion, not elsewhere classified: Secondary | ICD-10-CM

## 2021-05-18 DIAGNOSIS — J9622 Acute and chronic respiratory failure with hypercapnia: Secondary | ICD-10-CM | POA: Diagnosis not present

## 2021-05-18 DIAGNOSIS — J9621 Acute and chronic respiratory failure with hypoxia: Secondary | ICD-10-CM | POA: Diagnosis not present

## 2021-05-18 DIAGNOSIS — R0789 Other chest pain: Secondary | ICD-10-CM | POA: Diagnosis not present

## 2021-05-18 DIAGNOSIS — I5033 Acute on chronic diastolic (congestive) heart failure: Secondary | ICD-10-CM | POA: Diagnosis not present

## 2021-05-18 LAB — GLUCOSE, CAPILLARY
Glucose-Capillary: 172 mg/dL — ABNORMAL HIGH (ref 70–99)
Glucose-Capillary: 210 mg/dL — ABNORMAL HIGH (ref 70–99)
Glucose-Capillary: 260 mg/dL — ABNORMAL HIGH (ref 70–99)

## 2021-05-18 LAB — CBC
HCT: 24.5 % — ABNORMAL LOW (ref 39.0–52.0)
Hemoglobin: 7.3 g/dL — ABNORMAL LOW (ref 13.0–17.0)
MCH: 27.3 pg (ref 26.0–34.0)
MCHC: 29.8 g/dL — ABNORMAL LOW (ref 30.0–36.0)
MCV: 91.8 fL (ref 80.0–100.0)
Platelets: 310 10*3/uL (ref 150–400)
RBC: 2.67 MIL/uL — ABNORMAL LOW (ref 4.22–5.81)
RDW: 16.4 % — ABNORMAL HIGH (ref 11.5–15.5)
WBC: 11.9 10*3/uL — ABNORMAL HIGH (ref 4.0–10.5)
nRBC: 0 % (ref 0.0–0.2)

## 2021-05-18 LAB — BASIC METABOLIC PANEL
Anion gap: 12 (ref 5–15)
BUN: 120 mg/dL — ABNORMAL HIGH (ref 8–23)
CO2: 33 mmol/L — ABNORMAL HIGH (ref 22–32)
Calcium: 8 mg/dL — ABNORMAL LOW (ref 8.9–10.3)
Chloride: 91 mmol/L — ABNORMAL LOW (ref 98–111)
Creatinine, Ser: 5.11 mg/dL — ABNORMAL HIGH (ref 0.61–1.24)
GFR, Estimated: 11 mL/min — ABNORMAL LOW (ref >60)
Glucose, Bld: 177 mg/dL — ABNORMAL HIGH (ref 70–99)
Potassium: 3.4 mmol/L — ABNORMAL LOW (ref 3.5–5.1)
Sodium: 136 mmol/L (ref 135–145)

## 2021-05-18 LAB — ACID FAST SMEAR (AFB, MYCOBACTERIA): Acid Fast Smear: NEGATIVE

## 2021-05-18 LAB — CYTOLOGY - NON PAP

## 2021-05-18 LAB — PHOSPHORUS: Phosphorus: 5.8 mg/dL — ABNORMAL HIGH (ref 2.5–4.6)

## 2021-05-18 LAB — PROCALCITONIN: Procalcitonin: 0.74 ng/mL

## 2021-05-18 LAB — MAGNESIUM: Magnesium: 2.1 mg/dL (ref 1.7–2.4)

## 2021-05-18 MED ORDER — METHYLPREDNISOLONE SODIUM SUCC 125 MG IJ SOLR
0.5000 mg/kg | Freq: Two times a day (BID) | INTRAMUSCULAR | Status: DC
  Administered 2021-05-18 – 2021-05-19 (×3): 55 mg via INTRAVENOUS
  Filled 2021-05-18 (×3): qty 2

## 2021-05-18 MED ORDER — SODIUM CHLORIDE 0.9 % IV SOLN
100.0000 mg | Freq: Every day | INTRAVENOUS | Status: DC
  Administered 2021-05-19: 09:00:00 100 mg via INTRAVENOUS
  Filled 2021-05-18: qty 20

## 2021-05-18 MED ORDER — SODIUM CHLORIDE 0.9 % IV SOLN
200.0000 mg | Freq: Once | INTRAVENOUS | Status: AC
  Administered 2021-05-18: 200 mg via INTRAVENOUS
  Filled 2021-05-18: qty 200

## 2021-05-18 MED ORDER — IPRATROPIUM-ALBUTEROL 20-100 MCG/ACT IN AERS
1.0000 | INHALATION_SPRAY | Freq: Four times a day (QID) | RESPIRATORY_TRACT | Status: DC
Start: 2021-05-18 — End: 2021-05-19
  Administered 2021-05-18 – 2021-05-19 (×3): 1 via RESPIRATORY_TRACT
  Filled 2021-05-18: qty 4

## 2021-05-18 MED ORDER — SODIUM CHLORIDE 0.9 % IV SOLN
3.0000 g | Freq: Two times a day (BID) | INTRAVENOUS | Status: DC
  Administered 2021-05-18 – 2021-05-19 (×2): 3 g via INTRAVENOUS
  Filled 2021-05-18 (×3): qty 8

## 2021-05-18 MED ORDER — PREDNISONE 50 MG PO TABS: 50.0000 mg | ORAL_TABLET | Freq: Every day | ORAL | Status: DC

## 2021-05-18 NOTE — Progress Notes (Signed)
Progress Note  Patient Name: Stephen [name] Date of Encounter: 05/18/2021  [facility] Cardiologist: Ida Rogue, MD   Subjective   Patient looks altered, speaking gibberish, wife at bedside.  Not very coherent with speech.  Inpatient Medications    Scheduled Meds:  acetaminophen  650 mg Oral TID   aspirin EC  81 mg Oral Daily   atorvastatin  80 mg Oral QHS   calcitRIOL  0.25 mcg Oral Daily   carvedilol  12.5 mg Oral BID WC   Chlorhexidine Gluconate Cloth  6 each Topical Daily   cloNIDine  0.1 mg Oral TID   clopidogrel  75 mg Oral Daily   dorzolamide-timolol  1 drop Both Eyes BID   epoetin (EPOGEN/PROCRIT) injection  3,000 Units Subcutaneous Q14 Days   escitalopram  5 mg Oral Daily   ezetimibe  10 mg Oral Daily   feeding supplement  237 mL Oral TID BM   fluticasone furoate-vilanterol  1 puff Inhalation Daily   And   umeclidinium bromide  1 puff Inhalation Daily   gabapentin  100 mg Oral BID   guaiFENesin  1,200 mg Oral BID   heparin injection (subcutaneous)  5,000 Units Subcutaneous Q8H   hydrALAZINE  10 mg Oral TID   insulin aspart  0-20 Units Subcutaneous TID WC   insulin aspart  0-5 Units Subcutaneous QHS   insulin aspart  3 Units Subcutaneous TID WC   insulin detemir  30 Units Subcutaneous QHS   ipratropium-albuterol  3 mL Nebulization Q6H   isosorbide mononitrate  120 mg Oral Daily   levothyroxine  50 mcg Oral Q0600   methylPREDNISolone (SOLU-MEDROL) injection  0.5 mg/kg Intravenous Q12H   Followed by   Derrill Memo ON 05/04/2021] predniSONE  50 mg Oral Daily   montelukast  10 mg Oral Daily   multivitamin with minerals  1 tablet Oral Daily   pantoprazole  40 mg Oral Daily   sodium chloride flush  3 mL Intravenous Q12H   sodium chloride HYPERTONIC  4 mL Nebulization BID   tamsulosin  0.4 mg Oral QHS   cyanocobalamin  1,000 mcg Oral Daily   Continuous Infusions:  sodium chloride 10 mL/hr at 05/13/21 1614   meropenem (MERREM) IV Stopped (05/18/21 0213)    remdesivir 200 mg in sodium chloride 0.9% 250 mL IVPB     Followed by   Derrill Memo ON 05/19/2021] remdesivir 100 mg in NS 100 mL     PRN Meds: sodium chloride, albuterol, ALPRAZolam, HYDROcodone-acetaminophen, nitroGLYCERIN, ondansetron (ZOFRAN) IV, polyethylene glycol   Vital Signs    Vitals:   05/18/21 0816 05/18/21 0904 05/18/21 0944 05/18/21 1057  BP:  (!) 139/49 (!) 119/48   Pulse: (!) 55 (!) 59 62   Resp: 16 18    Temp:  98.2 F (36.8 C)    TempSrc:      SpO2: 99% 100%  98%  Weight:      Height:        Intake/Output Summary (Last 24 hours) at 05/18/2021 1302 Last data filed at 05/18/2021 1020 Gross per 24 hour  Intake 1794.67 ml  Output 300 ml  Net 1494.67 ml   Last 3 Weights 05/17/2021 05/11/2021 05/10/2021  Weight (lbs) 243 lb 243 lb 1.6 oz 259 lb 0.7 oz  Weight (kg) 110.224 kg 110.269 kg 117.5 kg  Some encounter information is confidential and restricted. Go to Review Flowsheets activity to see all data.      Telemetry    Sinus bradycardia, heart rate 55-  Personally Reviewed  ECG     - Personally Reviewed  Physical Exam   GEN: No acute distress, Neck: No JVD Cardiac: RRR, Respiratory: Poor inspiratory effort, diminished breath sounds bilaterally GI: Soft, nontender, non-distended  MS: trace edema; No deformity. Neuro:  Nonfocal  Psych: Normal affect unable to assess  Labs    High Sensitivity Troponin:   Recent Labs  Lab 04/04/2021 0213 04/23/2021 0344  TROPONINIHS 1,417* 1,514*     Chemistry Recent Labs  Lab 05/14/21 0601 05/15/21 0449 05/16/21 0516 05/17/21 0442 05/17/21 1350 05/18/21 0446  NA 140 137 136 136  --  136  K 3.4* 3.5 3.6 3.6  --  3.4*  CL 93* 92* 91* 92*  --  91*  CO2 37* 36* 37* 34*  --  33*  GLUCOSE 201* 253* 143* 191*  --  177*  BUN 93* 88* 84* 105*  --  120*  CREATININE 3.20* 3.25* 3.93* 4.48*  --  5.11*  CALCIUM 8.4* 8.5* 8.3* 8.1*  --  8.0*  MG 2.1  --  2.3  --   --  2.1  PROT  --   --   --  6.8 7.1  --   ALBUMIN  2.0* 2.1*  --  1.8*  --   --   AST  --   --   --  42*  --   --   ALT  --   --   --  58*  --   --   ALKPHOS  --   --   --  70  --   --   BILITOT  --   --   --  0.9  --   --   GFRNONAA 20* 19* 15* 13*  --  11*  ANIONGAP 10 9 8 10   --  12    Lipids No results for input(s): CHOL, TRIG, HDL, LABVLDL, LDLCALC, CHOLHDL in the last 168 hours.  Hematology Recent Labs  Lab 05/15/21 2055 05/17/21 0442 05/18/21 0446  WBC 12.4* 15.1* 11.9*  RBC 3.12* 2.81* 2.67*  HGB 8.4* 7.6* 7.3*  HCT 27.4* 25.8* 24.5*  MCV 87.8 91.8 91.8  MCH 26.9 27.0 27.3  MCHC 30.7 29.5* 29.8*  RDW 15.9* 16.5* 16.4*  PLT 312 290 310   Thyroid No results for input(s): TSH, FREET4 in the last 168 hours.  BNP No results for input(s): BNP, PROBNP in the last 168 hours.   DDimer No results for input(s): DDIMER in the last 168 hours.   Radiology    CT CHEST WO CONTRAST  Result Date: 05/17/2021 CLINICAL DATA:  Pneumonia EXAM: CT CHEST WITHOUT CONTRAST TECHNIQUE: Multidetector CT imaging of the chest was performed following the standard protocol without IV contrast. COMPARISON:  Previous studies including CT chest done on 04/05/2021 and chest radiograph done on 05/08/2021 FINDINGS: Cardiovascular: Heart is enlarged in size. Coronary artery calcifications are seen. Dense calcifications are seen in the mitral annulus. There are coarse calcifications in the thoracic aorta. Small pericardial effusion is present. Mediastinum/Nodes: No significant lymphadenopathy seen. Lungs/Pleura: There is almost complete atelectasis of left lung with air bronchograms. Left main bronchus appears narrower than the right. As far as seen, there is no definite demonstrable extrinsic mass lesion adjacent to the left main bronchus. Small left pleural effusion is present. There is minimal right pleural effusion. There is crowding of markings in the posterior right lung, possibly subsegmental atelectasis. There is no pneumothorax. Upper Abdomen: There is  2.8 cm nodule in the left  adrenal with no significant change. Surgical clips are seen in the right adrenal bed. Possible cyst is seen in the upper pole of left kidney. Musculoskeletal: Fairly extensive calcifications are seen in the anterior spinal ligament. Please correlate for possible ankylosing spondylitis. IMPRESSION: There is almost complete atelectasis of left lung, possibly due to mucous plugging. Small left pleural effusion is present. Linear densities and subtle ground-glass densities in the right lung may suggest scarring and subsegmental atelectasis. There is minimal right pleural effusion. There is 2.8 cm nodule in the left adrenal with no significant change. Other findings as described in the body of the report. Imaging finding of almost complete atelectasis of left lung was relayed to patient's nurse Ronalee Belts by telephone call. Electronically Signed   By: Elmer Picker M.D.   On: 05/17/2021 10:55   DG Chest Port 1 View  Result Date: 05/18/2021 CLINICAL DATA:  Acute respiratory failure with hypoxia and hypercarbia. EXAM: PORTABLE CHEST 1 VIEW COMPARISON:  May 17, 2021. FINDINGS: Low lung volumes. Persistent left basilar opacity. No visible pneumothorax on this semi erect radiograph. Cardiac silhouette is similar. Partially imaged right shoulder degenerative change. IMPRESSION: 1. Persistent left basilar opacity, which may reflect any combination of atelectasis/consolidation and layering pleural effusion. 2. Pulmonary vascular congestion. Electronically Signed   By: Margaretha Sheffield M.D.   On: 05/18/2021 08:05   DG Chest Port 1 View  Result Date: 05/17/2021 CLINICAL DATA:  Post thoracentesis EXAM: PORTABLE CHEST 1 VIEW COMPARISON:  05/15/2021 FINDINGS: No pneumothorax. Low lung volumes. Remains opacification at the left lung base. Increased right basilar atelectasis. IMPRESSION: No pneumothorax. Remains left lung base opacification, which may reflect combination  atelectasis/consolidation and residual pleural fluid. Increased right basilar atelectasis. Electronically Signed   By: Macy Mis M.D.   On: 05/17/2021 11:55   US THORACENTESIS ASP PLEURAL SPACE W/IMG GUIDE  Result Date: 05/17/2021 INDICATION: Shortness of breath with left pleural effusion, concern for infection. EXAM: ULTRASOUND GUIDED LEFT THORACENTESIS MEDICATIONS: Local 1% lidocaine only. COMPLICATIONS: None immediate. PROCEDURE: An ultrasound guided thoracentesis was thoroughly discussed with the patient and questions answered. The benefits, risks, alternatives and complications were also discussed. The patient understands and wishes to proceed with the procedure. Written consent was obtained. Ultrasound was performed to localize and mark an adequate pocket of fluid in the left chest. The area was then prepped and draped in the normal sterile fashion. 1% Lidocaine was used for local anesthesia. Under ultrasound guidance a 19 gauge, 7-cm, Yueh catheter was introduced. Thoracentesis was performed. The catheter was removed and a dressing applied. FINDINGS: A total of approximately 700 mL of blood tinged fluid was removed. Samples were sent to the laboratory as requested by the clinical team. IMPRESSION: Successful ultrasound guided left thoracentesis yielding 700 mL of pleural fluid. Read By: Tsosie Billing PA-C Electronically Signed   By: Albin Felling M.D.   On: 05/17/2021 11:24    Cardiac Studies   04/18/2021  1. Left ventricular ejection fraction, by estimation, is 60 to 65%. The  left ventricle has normal function. Left ventricular endocardial border  not optimally defined to evaluate regional wall motion. The left  ventricular internal cavity size was mildly  dilated. There is mild left ventricular hypertrophy. Left ventricular  diastolic parameters are consistent with Grade I diastolic dysfunction  (impaired relaxation). Elevated left atrial pressure.   2. Right ventricular systolic  function is normal. The right ventricular  size is not well visualized.   3. The mitral valve is abnormal. Trivial mitral  valve regurgitation. Mild  mitral stenosis. The mean mitral valve gradient is 5.0 mmHg.   4. Tricuspid valve regurgitation not well assessed.   5. The aortic valve has an indeterminant number of cusps. There is mild  calcification of the aortic valve. There is moderate thickening of the  aortic valve. Aortic valve regurgitation is trivial. Mild to moderate  aortic valve sclerosis/calcification is  present, without any evidence of aortic stenosis.   Patient Profile     75 y.o. male with history of CAD, HFpEF, COPD, pulmonary fibrosis, respiratory failure on home oxygen, CKD 4, diabetes being seen for shortness of breath and HFpEF.  Assessment & Plan    HFpEF -trace edema -Lasix stopped due to worsening renal function.   2.  NSTEMI -S/p Heparin x48 hours -Recent left heart cath 08/2020 moderate RCA, LAD, CTO mid left circumflex. -Continue aspirin, Lipitor, Imdur, Coreg, Plavix. -No plans for further invasive testing   3.  COPD, pulmonary fibrosis, lung collapse, pleural effusion -CPAP, oxygenation, inhalers as per pulmonary medicine -Long-term prognosis remains guarded  4.  Renal dysfunction -worsening -nephro following  5. Htn -Okay to reduce BP meds looks altered, if blood pressure running low.   Overall prognosis remains guarded.  No additional cardiac testing or intervention planned.  Continue care for chronic cardiac conditions as above.  Patient appears deconditioned, worsening pulmonary function.  Continue discussions regarding goals of care with family advised.  Please let us know if additional input is needed.  Total encounter time 35 minutes  Greater than 50% was spent in counseling and coordination of care with wife.     Signed, Kate Sable, MD  05/18/2021, 1:02 PM

## 2021-05-18 NOTE — Progress Notes (Signed)
[facility]  PROGRESS NOTE   Subjective:   Wife at bedside   Patient diagnosed with COVID.   Creatinine 5.11 (4.48) BUN 120 (105)  UOP 300  Patient feels worse. Also complain of bladder spasms    Objective:  Vital signs in last 24 hours:  Temp:  [97.2 F (36.2 C)-98.6 F (37 C)] 98.2 F (36.8 C) (11/16 0904) Pulse Rate:  [51-62] 62 (11/16 0944) Resp:  [13-18] 18 (11/16 0904) BP: (111-139)/(48-66) 119/48 (11/16 0944) SpO2:  [97 %-100 %] 98 % (11/16 1057) FiO2 (%):  [36 %] 36 % (11/16 0816)  Weight change:  Filed Weights   05/10/21 0310 05/11/21 1021 05/17/21 0600  Weight: 117.5 kg 110.3 kg 110.2 kg    Intake/Output: I/O last 3 completed shifts: In: 697.1 [P.O.:237; Other:60; IV Piggyback:400.1] Out: 600 [Urine:600]   Intake/Output this shift:  Total I/O In: 1457.7 [P.O.:240; I.V.:1117.7; IV Piggyback:100] Out: -   Physical Exam: General:  No acute distress, laying in bed  Head:  Normocephalic, atraumatic. Moist oral mucosal membranes  Eyes:  Anicteric  Neck:  Supple  Lungs:   Clear to auscultation, normal effort  Heart:  regular  Abdomen:   Soft, nontender, bowel sounds present  Extremities:  No peripheral edema.  Neurologic:  Awake, alert, following commands  Skin:  No lesions  GU Foley catheter with dark yellow urine    Basic Metabolic Panel: Recent Labs  Lab 05/12/21 0428 05/12/21 1017 05/13/21 0524 05/14/21 0601 05/15/21 0449 05/16/21 0516 05/17/21 0442 05/18/21 0446  NA 136  --  137  139 140 137 136 136 136  K 3.2*  --  3.5  3.6 3.4* 3.5 3.6 3.6 3.4*  CL 94*  --  94*  96* 93* 92* 91* 92* 91*  CO2 34*  --  34*  35* 37* 36* 37* 34* 33*  GLUCOSE 162*  --  193*  192* 201* 253* 143* 191* 177*  BUN 86*  --  82*  90* 93* 88* 84* 105* 120*  CREATININE 3.25*  --  3.19*  3.29* 3.20* 3.25* 3.93* 4.48* 5.11*  CALCIUM 7.7*  --  8.0*  8.1* 8.4* 8.5* 8.3* 8.1* 8.0*  MG  --  2.0 2.3 2.1  --  2.3  --  2.1  PHOS 4.8*  --  3.4   3.7 3.9  4.1 3.6  --   --  5.8*     CBC: Recent Labs  Lab 05/12/21 0429 05/13/21 0524 05/14/21 0601 05/15/21 2055 05/17/21 0442 05/18/21 0446  WBC 4.3 4.6 4.1 12.4* 15.1* 11.9*  NEUTROABS 2.7  --   --  10.6* 12.4*  --   HGB 8.5* 8.5* 7.9* 8.4* 7.6* 7.3*  HCT 27.7* 27.3* 25.8* 27.4* 25.8* 24.5*  MCV 89.6 88.9 89.0 87.8 91.8 91.8  PLT 266 263 274 312 290 310      Urinalysis: Recent Labs    05/16/21 0124  COLORURINE PINK*  LABSPEC 1.005  PHURINE 5.0  GLUCOSEU NEGATIVE  HGBUR MODERATE*  BILIRUBINUR NEGATIVE  KETONESUR NEGATIVE  PROTEINUR NEGATIVE  NITRITE NEGATIVE  LEUKOCYTESUR TRACE*       Imaging: CT CHEST WO CONTRAST  Result Date: 05/17/2021 CLINICAL DATA:  Pneumonia EXAM: CT CHEST WITHOUT CONTRAST TECHNIQUE: Multidetector CT imaging of the chest was performed following the standard protocol without IV contrast. COMPARISON:  Previous studies including CT chest done on 04/05/2021 and chest radiograph done on 05/08/2021 FINDINGS: Cardiovascular: Heart is enlarged in size. Coronary artery calcifications are seen. Dense calcifications are  seen in the mitral annulus. There are coarse calcifications in the thoracic aorta. Small pericardial effusion is present. Mediastinum/Nodes: No significant lymphadenopathy seen. Lungs/Pleura: There is almost complete atelectasis of left lung with air bronchograms. Left main bronchus appears narrower than the right. As far as seen, there is no definite demonstrable extrinsic mass lesion adjacent to the left main bronchus. Small left pleural effusion is present. There is minimal right pleural effusion. There is crowding of markings in the posterior right lung, possibly subsegmental atelectasis. There is no pneumothorax. Upper Abdomen: There is 2.8 cm nodule in the left adrenal with no significant change. Surgical clips are seen in the right adrenal bed. Possible cyst is seen in the upper pole of left kidney. Musculoskeletal: Fairly extensive  calcifications are seen in the anterior spinal ligament. Please correlate for possible ankylosing spondylitis. IMPRESSION: There is almost complete atelectasis of left lung, possibly due to mucous plugging. Small left pleural effusion is present. Linear densities and subtle ground-glass densities in the right lung may suggest scarring and subsegmental atelectasis. There is minimal right pleural effusion. There is 2.8 cm nodule in the left adrenal with no significant change. Other findings as described in the body of the report. Imaging finding of almost complete atelectasis of left lung was relayed to patient's nurse Ronalee Belts by telephone call. Electronically Signed   By: Elmer Picker M.D.   On: 05/17/2021 10:55   DG Chest Port 1 View  Result Date: 05/18/2021 CLINICAL DATA:  Acute respiratory failure with hypoxia and hypercarbia. EXAM: PORTABLE CHEST 1 VIEW COMPARISON:  May 17, 2021. FINDINGS: Low lung volumes. Persistent left basilar opacity. No visible pneumothorax on this semi erect radiograph. Cardiac silhouette is similar. Partially imaged right shoulder degenerative change. IMPRESSION: 1. Persistent left basilar opacity, which may reflect any combination of atelectasis/consolidation and layering pleural effusion. 2. Pulmonary vascular congestion. Electronically Signed   By: Margaretha Sheffield M.D.   On: 05/18/2021 08:05   DG Chest Port 1 View  Result Date: 05/17/2021 CLINICAL DATA:  Post thoracentesis EXAM: PORTABLE CHEST 1 VIEW COMPARISON:  05/15/2021 FINDINGS: No pneumothorax. Low lung volumes. Remains opacification at the left lung base. Increased right basilar atelectasis. IMPRESSION: No pneumothorax. Remains left lung base opacification, which may reflect combination atelectasis/consolidation and residual pleural fluid. Increased right basilar atelectasis. Electronically Signed   By: Macy Mis M.D.   On: 05/17/2021 11:55   US THORACENTESIS ASP PLEURAL SPACE W/IMG GUIDE  Result  Date: 05/17/2021 INDICATION: Shortness of breath with left pleural effusion, concern for infection. EXAM: ULTRASOUND GUIDED LEFT THORACENTESIS MEDICATIONS: Local 1% lidocaine only. COMPLICATIONS: None immediate. PROCEDURE: An ultrasound guided thoracentesis was thoroughly discussed with the patient and questions answered. The benefits, risks, alternatives and complications were also discussed. The patient understands and wishes to proceed with the procedure. Written consent was obtained. Ultrasound was performed to localize and mark an adequate pocket of fluid in the left chest. The area was then prepped and draped in the normal sterile fashion. 1% Lidocaine was used for local anesthesia. Under ultrasound guidance a 19 gauge, 7-cm, Yueh catheter was introduced. Thoracentesis was performed. The catheter was removed and a dressing applied. FINDINGS: A total of approximately 700 mL of blood tinged fluid was removed. Samples were sent to the laboratory as requested by the clinical team. IMPRESSION: Successful ultrasound guided left thoracentesis yielding 700 mL of pleural fluid. Read By: Tsosie Billing PA-C Electronically Signed   By: Albin Felling M.D.   On: 05/17/2021 11:24  Medications:    sodium chloride 10 mL/hr at 05/13/21 1614   meropenem (MERREM) IV Stopped (05/18/21 5732)   remdesivir 200 mg in sodium chloride 0.9% 250 mL IVPB     Followed by   Derrill Memo ON 05/19/2021] remdesivir 100 mg in NS 100 mL      acetaminophen  650 mg Oral TID   acetylcysteine  2 mL Nebulization STAT   aspirin EC  81 mg Oral Daily   atorvastatin  80 mg Oral QHS   calcitRIOL  0.25 mcg Oral Daily   carvedilol  12.5 mg Oral BID WC   Chlorhexidine Gluconate Cloth  6 each Topical Daily   cloNIDine  0.1 mg Oral TID   clopidogrel  75 mg Oral Daily   dorzolamide-timolol  1 drop Both Eyes BID   epoetin (EPOGEN/PROCRIT) injection  3,000 Units Subcutaneous Q14 Days   escitalopram  5 mg Oral Daily   ezetimibe  10 mg Oral  Daily   feeding supplement  237 mL Oral TID BM   fluticasone furoate-vilanterol  1 puff Inhalation Daily   And   umeclidinium bromide  1 puff Inhalation Daily   gabapentin  100 mg Oral BID   guaiFENesin  1,200 mg Oral BID   heparin injection (subcutaneous)  5,000 Units Subcutaneous Q8H   hydrALAZINE  10 mg Oral TID   insulin aspart  0-20 Units Subcutaneous TID WC   insulin aspart  0-5 Units Subcutaneous QHS   insulin aspart  3 Units Subcutaneous TID WC   insulin detemir  30 Units Subcutaneous QHS   ipratropium-albuterol  3 mL Nebulization Q6H   isosorbide mononitrate  120 mg Oral Daily   levothyroxine  50 mcg Oral Q0600   methylPREDNISolone (SOLU-MEDROL) injection  0.5 mg/kg Intravenous Q12H   Followed by   Derrill Memo ON 05/25/2021] predniSONE  50 mg Oral Daily   montelukast  10 mg Oral Daily   multivitamin with minerals  1 tablet Oral Daily   pantoprazole  40 mg Oral Daily   sodium chloride flush  3 mL Intravenous Q12H   sodium chloride HYPERTONIC  4 mL Nebulization BID   tamsulosin  0.4 mg Oral QHS   cyanocobalamin  1,000 mcg Oral Daily    Assessment/ Plan:     Principal Problem:   Acute on chronic diastolic CHF (congestive heart failure) (HCC) Active Problems:   Elevated troponin   Unstable angina (HCC)   Pulmonary hypertension (HCC)   Acute on chronic respiratory failure with hypoxia and hypercapnia (HCC)   CKD (chronic kidney disease), stage IV (HCC)   Interstitial lung disease (HCC)   B12 deficiency   Pressure injury of skin   Moderate major depression, single episode (HCC)   Atypical chest pain   SOB (shortness of breath)  [name] is a 75 y.o. white male with hypertension, coronary artery disease, congestive heart failure, diabetes mellitus type II, peripheral vascular disease, COPD who is admitted to [facility] on 04/04/2021 for Unstable angina (HCC) [I20.0] CHF (congestive heart failure) ([place]) [I50.9] Pain [R52] Atypical chest pain [R07.89] NSTEMI (non-ST  elevated myocardial infarction) ([place]) [I21.4]  Acute kidney injury on chronic kidney disease stage IV baseline creatinine of 2.92 GFR of 22 on 02/24/21. Acute kidney injury secondary to obstructive uropathy. Followed by Dr. Holley Raring as outpatient.  Concern for overdiuresis. Discontinued acetazolamide and furosemide.  Low threshold for dialysis. Discussed with patient and his wife.   Hypertension and acute exacerbation of chronic diastolic congestive heart failure. Currently holding furosemide and  actazolamide. Home regimen of clonidine, torsemide, carvedilol, isosorbide mononitrate and tamsulosin. Status post Left thoracentesis on 11/15.  Continue to hold diuretics as above.  Continue tamsulosin, carvedilol, hydralazine, and clonidine.   Anemia with chronic kidney disease: hemoglobin 7.3. Patient received EPO subcutaneous on 11/7.   Hematuria: Urology on 11/12 determined that hematuria was due to foley catheterization. Patient underwent manual irrigations. Currently with no gross hematuria. Although now complaining of bladder spasms.   Secondary Hyperparathyroidism: PTH 182 on 02/08/21. Calcium and phosphorus at goal on most recent labs.  - Continue calcitriol   LOS: [place], MD [facility] 11/16/202211:53 AM

## 2021-05-18 NOTE — Progress Notes (Signed)
PT Cancellation Note  Patient Details Name: Stephen [name] MRN: [id number] DOB: 26-Jun-1946   Cancelled Treatment:     Multiple attempts to treat pt today. Pt was not appropriate and was very fatigued. Off/on bi-pap per spouse. Ongoing discussion about care plan and steps going forward. Will await pt/family decision for plan going forward. Until new plan made, will continue to follow per current POC.     Willette Pa 05/18/2021, 4:30 PM

## 2021-05-18 NOTE — Progress Notes (Addendum)
Daily Progress Note   Patient Name: Stephen [name]       Date: 05/18/2021 DOB: May 17, 1946  Age: 75 y.o. MRN#: [id number] Attending Physician: Annita Brod, MD Primary Care Physician: [name], MD Admit Date: 04/13/2021 Length of Stay: 15 days  Reason for Consultation/Follow-up: Establishing goals of care  HPI/Patient Profile:  75 y.o. male  with past medical history of multivessel coronary disease, chronic heart failure, pulmonary fibrosis, pulmonary hypertension, COPD, chronic kidney disease, diabetes, renal artery stenosis, and interstitial lung disease admitted on 04/17/2021 with acute on chronic heart failure with demand ischemia. Palliative medicine was consulted to discuss goals of care and symptom management.  Subjective:   Subjective: Chart Reviewed. Updates received. Patient Assessed. Created space and opportunity for patient  and family to explore thoughts and feelings regarding current medical situation.  Today's Discussion: Today I met with the patient and his wife at the bedside.  Allow space and opportunity for sharing.  His wife shares that they have been married for 69 years.  They have twin sons and a daughter as well as 10 grandchildren and 1 great grandchild.  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  She shares that he was healthy for very long time, hard worker which is what makes his current state so difficult to except.  His wife notes that his kidneys are getting worse.  There is a question of dialysis and when the patient was initially asked yesterday he said "well, its dialysis or die."  However she accepts that his lungs are worsening and this would make it a hard decision.  His overall goal was to get back to riding his lawnmower.  He ran when his wife CAUTI "mama or ministry" where he would care for the guards of others who are  debilitated or elderly.  However, he has not been able to do that for some time and she notes that this is consistent with a decreased quality of life.  She shares that he cared for his mom while she was in and out of the hospital and eventually passed.  He also prefers to start who had significant health concerns, and tracheostomy tube, and eventually was in the ICU when she asked him "should I stay here or go to the hospice home."  She shared that he advised her to go to the hospice home where she would get more rest and better care.  She seems to accept that this may be heading that direction for her husband.  While we are talking, the patient's BiPAP was removed so that he can be given medications.  He began attempting to communicate with [place], no profound weakness was noted.  He did say "I feel like I am going to die."  He denied pain, dyspnea, or any specific feelings but rather just "he got feeling in my heart."  I shared with him that he learned about his large and loving family and he stated "I do not want to leave them, but I do not want to drag this out."  His wife tearful during this whole conversation today.  However, she seems to have solace in the membranes and has probably displayed pictures of her children and her name.  I discussed possible options including comfort care in hospital, possible hospice if he would tolerate transfer.  I did share that I  felt at least that we should not accelerate care.  She states that she would like to talk today because they are so involved in the patient's life.  I will meet with them tomorrow in person, on telephone, or combination of the note.    I provided emotional general support and therapeutic listening, empathy, sharing, and other techniques.  I answered all questions and addressed all concerns.  Review of Systems  Constitutional:        Denies pain in general "I feel like I'm going to die"  Respiratory:  Negative for shortness of breath.    Cardiovascular:  Negative for chest pain.  Gastrointestinal:  Negative for abdominal pain, nausea and vomiting.   Objective:   Vital Signs:  BP (!) 116/54 (BP Location: Right Wrist)   Pulse 80   Temp 98 F (36.7 C)   Resp 18   Ht _0  (1.651 m)   Wt 110.2 kg   SpO2 99%   BMI 40.44 kg/m   Physical Exam: Physical Exam Vitals and nursing note reviewed.  Constitutional:      General: He is sleeping. He is not in acute distress.    Appearance: He is obese. He is ill-appearing.  Cardiovascular:     Rate and Rhythm: Normal rate.     Heart sounds: No murmur heard. Pulmonary:     Effort: Pulmonary effort is normal.     Breath sounds: Examination of the left-upper field reveals decreased breath sounds. Examination of the left-middle field reveals decreased breath sounds. Examination of the left-lower field reveals decreased breath sounds. Decreased breath sounds present. No wheezing or rhonchi.  Abdominal:     General: Abdomen is protuberant.     Palpations: Abdomen is soft.  Skin:    General: Skin is warm and dry.  Neurological:     Mental Status: He is easily aroused. He is lethargic.    Palliative Assessment/Data: 30%   Assessment & Plan:   Impression: Present on Admission:  Unstable angina [facility])  Patient with multiple acute and chronic conditions.  Currently appears to be in multisystem organ failure and progressive decline over the last several days.  Creatinine trending up and question for need of dialysis, noted debility/deconditioning and previously felt appropriate for SNF/rehab.  However, given his poor lung function and heart function I doubt that he would tolerate dialysis, catheter placement, or rehab.  Overall prognosis very poor.  Discussed with wife that the patient appears to be approaching end-of-life.  I am concerned about his ability to tolerate HD line placement, tolerating hemodialysis with his current pulmonary and cardiac function, ability to tolerate  rehab with his multisystem organ failure.  SUMMARY OF RECOMMENDATIONS   Remain DNR Allow time for family discussions Follow-up tomorrow for further goals of care discussions Would recommend NOT escalating care, although family needs time to decide definitively PMT will continue to follow  Code Status: DNR  Prognosis: < 6 months  Discharge Planning: To Be Determined  Discussed with: Medical team, nursing team, patient, patient's wife  Thank you for allowing [place] to participate in the care of Stephen [name] PMT will continue to support holistically.  Time Total: 75 min  Visit consisted of counseling and education dealing with the complex and emotionally intense issues of symptom management and palliative care in the setting of serious and potentially life-threatening illness. Greater than 50%  of this time was spent counseling and coordinating care related to the above assessment and plan.  Walden Field, NP  Palliative Medicine Team  Team Phone # [phone] (Nights/Weekends)  03/01/2021, 8:17 AM

## 2021-05-18 NOTE — Progress Notes (Signed)
Date of Admission:  05/01/2021    Family has been talking to palliative care, pt was expressing that he is going to die ID: Stephen [name] is a 75 y.o. male Principal Problem:   Acute on chronic diastolic CHF (congestive heart failure) (HCC) Active Problems:   Elevated troponin   Unstable angina (HCC)   Pulmonary hypertension (HCC)   Acute on chronic respiratory failure with hypoxia and hypercapnia (HCC)   CKD (chronic kidney disease), stage IV (HCC)   Interstitial lung disease ([place])   B12 deficiency   Pressure injury of skin   Moderate major depression, single episode ([place])   Atypical chest pain   SOB (shortness of breath)      Medications:   acetaminophen  650 mg Oral TID   aspirin EC  81 mg Oral Daily   atorvastatin  80 mg Oral QHS   calcitRIOL  0.25 mcg Oral Daily   carvedilol  12.5 mg Oral BID WC   Chlorhexidine Gluconate Cloth  6 each Topical Daily   cloNIDine  0.1 mg Oral TID   clopidogrel  75 mg Oral Daily   dorzolamide-timolol  1 drop Both Eyes BID   epoetin (EPOGEN/PROCRIT) injection  3,000 Units Subcutaneous Q14 Days   escitalopram  5 mg Oral Daily   ezetimibe  10 mg Oral Daily   feeding supplement  237 mL Oral TID BM   fluticasone furoate-vilanterol  1 puff Inhalation Daily   And   umeclidinium bromide  1 puff Inhalation Daily   gabapentin  100 mg Oral BID   guaiFENesin  1,200 mg Oral BID   heparin injection (subcutaneous)  5,000 Units Subcutaneous Q8H   hydrALAZINE  10 mg Oral TID   insulin aspart  0-20 Units Subcutaneous TID WC   insulin aspart  0-5 Units Subcutaneous QHS   insulin aspart  3 Units Subcutaneous TID WC   insulin detemir  30 Units Subcutaneous QHS   ipratropium-albuterol  3 mL Nebulization Q6H   isosorbide mononitrate  120 mg Oral Daily   levothyroxine  50 mcg Oral Q0600   methylPREDNISolone (SOLU-MEDROL) injection  0.5 mg/kg Intravenous Q12H   Followed by   Derrill Memo ON 05/26/2021] predniSONE  50 mg Oral Daily   montelukast  10 mg  Oral Daily   multivitamin with minerals  1 tablet Oral Daily   pantoprazole  40 mg Oral Daily   sodium chloride flush  3 mL Intravenous Q12H   sodium chloride HYPERTONIC  4 mL Nebulization BID   tamsulosin  0.4 mg Oral QHS   cyanocobalamin  1,000 mcg Oral Daily    Objective: Vital signs in last 24 hours: Temp:  [97.2 F (36.2 C)-98.6 F (37 C)] 98 F (36.7 C) (11/16 1324) Pulse Rate:  [51-80] 80 (11/16 1324) Resp:  [13-18] 18 (11/16 1324) BP: (111-139)/(48-66) 116/54 (11/16 1324) SpO2:  [97 %-100 %] 99 % (11/16 1333) FiO2 (%):  [36 %] 36 % (11/16 1333) Lab Results Recent Labs    05/17/21 0442 05/18/21 0446  WBC 15.1* 11.9*  HGB 7.6* 7.3*  HCT 25.8* 24.5*  NA 136 136  K 3.6 3.4*  CL 92* 91*  CO2 34* 33*  BUN 105* 120*  CREATININE 4.48* 5.11*   Liver Panel Recent Labs    05/17/21 0442 05/17/21 1350  PROT 6.8 7.1  ALBUMIN 1.8*  --   AST 42*  --   ALT 58*  --   ALKPHOS 70  --   BILITOT 0.9  --  Microbiology: Jodelle Red 2 positive MRSA nares neg Studies/Results: CT CHEST WO CONTRAST  Result Date: 05/17/2021 CLINICAL DATA:  Pneumonia EXAM: CT CHEST WITHOUT CONTRAST TECHNIQUE: Multidetector CT imaging of the chest was performed following the standard protocol without IV contrast. COMPARISON:  Previous studies including CT chest done on 04/05/2021 and chest radiograph done on 05/08/2021 FINDINGS: Cardiovascular: Heart is enlarged in size. Coronary artery calcifications are seen. Dense calcifications are seen in the mitral annulus. There are coarse calcifications in the thoracic aorta. Small pericardial effusion is present. Mediastinum/Nodes: No significant lymphadenopathy seen. Lungs/Pleura: There is almost complete atelectasis of left lung with air bronchograms. Left main bronchus appears narrower than the right. As far as seen, there is no definite demonstrable extrinsic mass lesion adjacent to the left main bronchus. Small left pleural effusion is present. There is  minimal right pleural effusion. There is crowding of markings in the posterior right lung, possibly subsegmental atelectasis. There is no pneumothorax. Upper Abdomen: There is 2.8 cm nodule in the left adrenal with no significant change. Surgical clips are seen in the right adrenal bed. Possible cyst is seen in the upper pole of left kidney. Musculoskeletal: Fairly extensive calcifications are seen in the anterior spinal ligament. Please correlate for possible ankylosing spondylitis. IMPRESSION: There is almost complete atelectasis of left lung, possibly due to mucous plugging. Small left pleural effusion is present. Linear densities and subtle ground-glass densities in the right lung may suggest scarring and subsegmental atelectasis. There is minimal right pleural effusion. There is 2.8 cm nodule in the left adrenal with no significant change. Other findings as described in the body of the report. Imaging finding of almost complete atelectasis of left lung was relayed to patient's nurse Ronalee Belts by telephone call. Electronically Signed   By: Elmer Picker M.D.   On: 05/17/2021 10:55   DG Chest Port 1 View  Result Date: 05/18/2021 CLINICAL DATA:  Acute respiratory failure with hypoxia and hypercarbia. EXAM: PORTABLE CHEST 1 VIEW COMPARISON:  May 17, 2021. FINDINGS: Low lung volumes. Persistent left basilar opacity. No visible pneumothorax on this semi erect radiograph. Cardiac silhouette is similar. Partially imaged right shoulder degenerative change. IMPRESSION: 1. Persistent left basilar opacity, which may reflect any combination of atelectasis/consolidation and layering pleural effusion. 2. Pulmonary vascular congestion. Electronically Signed   By: Margaretha Sheffield M.D.   On: 05/18/2021 08:05   DG Chest Port 1 View  Result Date: 05/17/2021 CLINICAL DATA:  Post thoracentesis EXAM: PORTABLE CHEST 1 VIEW COMPARISON:  05/15/2021 FINDINGS: No pneumothorax. Low lung volumes. Remains opacification at  the left lung base. Increased right basilar atelectasis. IMPRESSION: No pneumothorax. Remains left lung base opacification, which may reflect combination atelectasis/consolidation and residual pleural fluid. Increased right basilar atelectasis. Electronically Signed   By: Macy Mis M.D.   On: 05/17/2021 11:55   US THORACENTESIS ASP PLEURAL SPACE W/IMG GUIDE  Result Date: 05/17/2021 INDICATION: Shortness of breath with left pleural effusion, concern for infection. EXAM: ULTRASOUND GUIDED LEFT THORACENTESIS MEDICATIONS: Local 1% lidocaine only. COMPLICATIONS: None immediate. PROCEDURE: An ultrasound guided thoracentesis was thoroughly discussed with the patient and questions answered. The benefits, risks, alternatives and complications were also discussed. The patient understands and wishes to proceed with the procedure. Written consent was obtained. Ultrasound was performed to localize and mark an adequate pocket of fluid in the left chest. The area was then prepped and draped in the normal sterile fashion. 1% Lidocaine was used for local anesthesia. Under ultrasound guidance a 19 gauge, 7-cm, Yueh catheter  was introduced. Thoracentesis was performed. The catheter was removed and a dressing applied. FINDINGS: A total of approximately 700 mL of blood tinged fluid was removed. Samples were sent to the laboratory as requested by the clinical team. IMPRESSION: Successful ultrasound guided left thoracentesis yielding 700 mL of pleural fluid. Read By: Tsosie Billing PA-C Electronically Signed   By: Albin Felling M.D.   On: 05/17/2021 11:24     Assessment/Plan: Encephalopathy.  Fluctuating.  Likely due to CO2 narcosis.  Acute respiratory failure on chronic hypoxic and hypercarbic respiratory failure.  On BiPAP Left lung atelectasis and lower lobe consolidation very likely could be from mucous plugging.  After aspiration of pleural fluid the lung has expanded a little. Pt's condition too  tenuous to have  bronchoscopy to clear the mucous plugs. MRSA nares neg Change meropenem to Unasyn for 3 more days  New covid infection- wife recently had it Ct value 23- so this is acute- on remdisivir   CHF  Interstitial lung disease COPD OSA Pulmonary hypertension  No evidence of urinary tract infection currently BPH with urinary retention.  Has a Foley catheter.  Received 6 days of IV ceftriaxone 10 days ago.   ID will sign off - call if needed

## 2021-05-18 NOTE — Progress Notes (Signed)
NAME:  Stephen [name], MRN:  [id number], DOB:  1945-11-02, LOS: 40 ADMISSION DATE:  04/07/2021, CONSULTATION DATE:  11/15 REFERRING MD:  [place]ada Crane, CHIEF COMPLAINT:  dyspnea  History of Present Illness:  75 y/o male admitted with acute on chronic hypoxemic and hypercarbic respiratory failure in setting of ILD, decompensated HFpEF, pulmonary hypertension, had left lung white out on 11/14, improved with thoracentesis taking 700cc off.  Has profound muscular deconditioning, has worsening renal function.   Pertinent  Medical History  HFpEF Obstructive sleep apnea Pulmonary fibrosis Coronary artery disease Myocardial infarction Chronic kidney disease stage III Left renal artery stenosis Diabetes mellitus type 2 Hypertension Hyperlipidemia Recurrent UTIs Peripheral vascular disease  Micro Data:  04/26/2021: SARS-CoV-2 and influenza PCR>> negative 05/09/2021: Hepatitis B surface antigen>> non-reactive 05/15/2021: Urine culture>> 05/17/2021: Pleural fluid>> 11/15 sars cov 2/flu > positive   Antimicrobials:  Ceftriaxone 11/6>> 11/10 Doxycycline 10/31>> 11/3 Meropenem 11/14>>   Significant Hospital Events: Including procedures, antibiotic start and stop dates in addition to other pertinent events   04/16/2021: Presented to ED ED with chest pain and shortness of breath (just 2 days after prior admission).  Admitted by hospitalist for treatment of acute CHF exacerbation 05/16/2021: New fever.  UA with pyuria and concern for parapneumonic effusion, placed on meropenem 05/17/2021: CT chest with concern for near complete atelectasis of the left lung.  Underwent thoracentesis with removal of 700 cc of blood-tinged fluid.  PCCM contacted for pulmonary consult COVID positive  Interim History / Subjective:  COVID positive Slept on bipap More awake this morning Says it's the worst day of his life  Objective   Blood pressure (!) 111/50, pulse (!) 55, temperature 98.6 F (37 C), temperature  source Axillary, resp. rate 16, height 5\' 5"  (1.651 m), weight 110.2 kg, SpO2 99 %.    FiO2 (%):  [36 %] 36 %   Intake/Output Summary (Last 24 hours) at 05/18/2021 0847 Last data filed at 05/18/2021 0143 Gross per 24 hour  Intake 337 ml  Output 300 ml  Net 37 ml   Filed Weights   05/10/21 0310 05/11/21 1021 05/17/21 0600  Weight: 117.5 kg 110.3 kg 110.2 kg    Examination:  General:  Resting comfortably in bed on NIMV HENT: NCAT OP clear PULM: Diminished left lung, clear on right, normal effort CV: RRR, no mgr GI: BS+, soft, nontender MSK: normal bulk and tone Neuro: awake, alert, no distress, MAEW   Resolved Hospital Problem list     Assessment & Plan:  Chronic respiratory failure with hypoxemia and hypercarbia>  Continue BIPAP Minimize sedation Monitor respiratory status closely Goal SaO2 88-92%  Baseline ILD (left greater than right, felt to be due to occupational exposure) Continue current management  Fever, leukocytosis, increased cough: HCAP, COVID 19 Defer to ID re meropenem Will go ahead and start remdesivir. Unclear to me how much of his lung disease is related to COVID, if this is relatively early in his course then remdesivir may help some. Start steroids Check CRP/LDH/Ferritin  Parapneumonic effusion> transudate based on albumin level Monitor CXR prn  Left lung atelectasis> possibly some mucus plugging in left lung based on exam, imaging CXR prn Hypertonic saline neb bid Guaifenesin  Pulmonary hypertension/CHF> will need to consider hemodialysis, but is he a good long term candidate? Not sure he can get in and out of a dialysis chair by himself Per renal Will consult palliative medicine to assist in conversations around this  Physical deconditioning Work with PT  Acute metabolic encephalopathy  due to hypercarbia Hold sedation  Best Practice (right click and "Reselect all SmartList Selections" daily)    Per TRH Code status DNR  Labs    CBC: Recent Labs  Lab 05/12/21 0429 05/13/21 0524 05/14/21 0601 05/15/21 2055 05/17/21 0442 05/18/21 0446  WBC 4.3 4.6 4.1 12.4* 15.1* 11.9*  NEUTROABS 2.7  --   --  10.6* 12.4*  --   HGB 8.5* 8.5* 7.9* 8.4* 7.6* 7.3*  HCT 27.7* 27.3* 25.8* 27.4* 25.8* 24.5*  MCV 89.6 88.9 89.0 87.8 91.8 91.8  PLT 266 263 274 312 290 465    Basic Metabolic Panel: Recent Labs  Lab 05/12/21 0428 05/12/21 1017 05/13/21 0524 05/14/21 0601 05/15/21 0449 05/16/21 0516 05/17/21 0442 05/18/21 0446  NA 136  --  137  139 140 137 136 136 136  K 3.2*  --  3.5  3.6 3.4* 3.5 3.6 3.6 3.4*  CL 94*  --  94*  96* 93* 92* 91* 92* 91*  CO2 34*  --  34*  35* 37* 36* 37* 34* 33*  GLUCOSE 162*  --  193*  192* 201* 253* 143* 191* 177*  BUN 86*  --  82*  90* 93* 88* 84* 105* 120*  CREATININE 3.25*  --  3.19*  3.29* 3.20* 3.25* 3.93* 4.48* 5.11*  CALCIUM 7.7*  --  8.0*  8.1* 8.4* 8.5* 8.3* 8.1* 8.0*  MG  --  2.0 2.3 2.1  --  2.3  --  2.1  PHOS 4.8*  --  3.4  3.7 3.9  4.1 3.6  --   --  5.8*   GFR: Estimated Creatinine Clearance: 14.8 mL/min (A) (by C-G formula based on SCr of 5.11 mg/dL (H)). Recent Labs  Lab 05/14/21 0601 05/15/21 2055 05/17/21 0442 05/18/21 0446  PROCALCITON  --  0.16 0.66 0.74  WBC 4.1 12.4* 15.1* 11.9*    Liver Function Tests: Recent Labs  Lab 05/12/21 0428 05/13/21 0524 05/14/21 0601 05/15/21 0449 05/17/21 0442 05/17/21 1350  AST  --   --   --   --  42*  --   ALT  --   --   --   --  58*  --   ALKPHOS  --   --   --   --  70  --   BILITOT  --   --   --   --  0.9  --   PROT  --   --   --   --  6.8 7.1  ALBUMIN 1.9* 1.9* 2.0* 2.1* 1.8*  --    No results for input(s): LIPASE, AMYLASE in the last 168 hours. No results for input(s): AMMONIA in the last 168 hours.  ABG    Component Value Date/Time   PHART 7.43 05/17/2021 1232   PCO2ART 62 (H) 05/17/2021 1232   PO2ART 107 05/17/2021 1232   HCO3 41.2 (H) 05/17/2021 1232   TCO2 25 08/31/2010 1529   O2SAT 98.3  05/17/2021 1232     Coagulation Profile: No results for input(s): INR, PROTIME in the last 168 hours.  Cardiac Enzymes: No results for input(s): CKTOTAL, CKMB, CKMBINDEX, TROPONINI in the last 168 hours.  HbA1C: Hgb A1c MFr Bld  Date/Time Value Ref Range Status  05/01/2021 06:51 AM 7.5 (H) 4.8 - 5.6 % Final    Comment:    (NOTE) Pre diabetes:          5.7%-6.4%  Diabetes:              >  6.4%  Glycemic control for   <7.0% adults with diabetes   02/21/2021 02:44 PM 6.7 (H) 4.6 - 6.5 % Final    Comment:    Glycemic Control Guidelines for People with Diabetes:Non Diabetic:  <6%Goal of Therapy: <7%Additional Action Suggested:  >8%     CBG: Recent Labs  Lab 05/16/21 2017 05/17/21 0755 05/17/21 1146 05/17/21 1617 05/17/21 2048  GLUCAP 265* 204* 266* 207* 179*    > 35 minutes spent on this visit coordinating care, discussing with other physicians seeing him, reviewing multiple databases and spending time with the patient, his wife and bedside nurse in consultation    Roselie Awkward, MD [place] [facility] Pager: [phone] Cell: [phone] After 7:00 pm call Elink  [phone]

## 2021-05-18 NOTE — Consult Note (Signed)
Remdesivir - Pharmacy Brief Note   O:  ALT: 58 (26 on 04/2021) CXR: LLL opacification. Repeated shows persistent left basilar opacity, combination of atelectasis/consolidation and layering pleural effusion. SpO2: 4-5L/min FiO2 36% on 98-100%   A/P:  Remdesivir 200 mg IVPB once followed by 100 mg IVPB daily x 4 days.   Lorna Dibble, PharmD, [facility] Clinical Pharmacist 05/18/2021 9:11 AM

## 2021-05-18 NOTE — Progress Notes (Addendum)
PROGRESS NOTE  Stephen [name] RWE:[id number] DOB: April 12, 1946 DOA: 04/30/2021 PCP: [name], MD  HPI/Recap of past 57 hours: 75 year old male with past medical history for chronic interstitial lung disease, pulmonary hypertension, morbid obesity, obstructive sleep apnea with chronic respiratory failure with hypoxia, CAD, diabetes mellitus and stage IV chronic kidney disease with recent hospitalization a month prior for non-STEMI and heart failure who started experiencing chest pain and shortness of breath 2 days after discharge patient noted to be hypoxic and brought into the emergency room.  Felt to be in volume overload and admitted to the hospitalist service on 10/31.  Since hospitalization, he has been diuresed which initially improved.  Hospitalization also complicated by urinary retention and hematuria with Foley catheter placement by urology.  Also noted to have some confusion felt to be secondary to hypercarbia and parapneumonic effusion.  Pulmonary consulted for worsening hypoxia and CT scan of chest noted complete left lung atelectasis.  Patient underwent left-sided thoracentesis with 700 cc of fluid removed.  Nephrology has been following for worsening kidney injury.  Patient made DNR on 11/15.  Palliative care has been asked to see.  After having increased cough, patient ended up testing positive for COVID, likely from family who have also gotten COVID.  Patient overall continues to do poorly.    Assessment/Plan: Principal Problem:   Acute on chronic respiratory failure with hypoxia and hypercapnia (HCC) in the setting of chronic interstitial lung disease, parapneumonic effusion, pulmonary hypertension, acute on chronic diastolic heart failure, left lung atelectasis felt to be secondary from mucous plugging, healthcare associated pneumonia and COVID-19 infection.: Greatly appreciate critical care help.  Patient has been on steroids, Remdisivir, IV meropenem.    Acute kidney  injury in the setting of stage IV chronic kidney disease: Nephrology following.  Felt to be secondary to obstructive uropathy as well as overdiuresis.  Not long-term candidate for dialysis.      B12 deficiency: Replacement   Pressure injury of skin:  Pressure Injury 05/04/21 Buttocks Right;Left Stage 2 -  Partial thickness loss of dermis presenting as a shallow open injury with a red, pink wound bed without slough. Pressure wound on buttocks (Active)  05/04/21 1145  Location: Buttocks  Location Orientation: Right;Left  Staging: Stage 2 -  Partial thickness loss of dermis presenting as a shallow open injury with a red, pink wound bed without slough.  Wound Description (Comments): Pressure wound on buttocks  Present on Admission: Yes     Pressure Injury 05/11/21 Buttocks Right Stage 2 -  Partial thickness loss of dermis presenting as a shallow open injury with a red, pink wound bed without slough. (Active)  05/11/21 1000  Location: Buttocks  Location Orientation: Right  Staging: Stage 2 -  Partial thickness loss of dermis presenting as a shallow open injury with a red, pink wound bed without slough.  Wound Description (Comments):   Present on Admission:      Pressure Injury 05/11/21 Buttocks Left Stage 2 -  Partial thickness loss of dermis presenting as a shallow open injury with a red, pink wound bed without slough. (Active)  05/11/21 1000  Location: Buttocks  Location Orientation: Left  Staging: Stage 2 -  Partial thickness loss of dermis presenting as a shallow open injury with a red, pink wound bed without slough.  Wound Description (Comments):   Present on Admission:      Moderate major depression, single episode [facility]): Seen by psychiatry.  Hypothyroidism: Continue Synthroid  Diabetes mellitus: Sliding scale,  insulin  Anemia of chronic disease: Has been Epogen  Code Status: DNR  Family Communication: Wife at the bedside  Disposition Plan: Poor  prognosis   Consultants: Palliative care Urology Critical care/pulmonary Nephrology Psychiatry  Procedures: Placement of Foley catheter  Antimicrobials: Remdisivir IV meropenem  DVT prophylaxis: Subcu heparin  Level of care: Progressive Cardiac   Objective: Vitals:   05/18/21 0904 05/18/21 0944  BP: (!) 139/49 (!) 119/48  Pulse: (!) 59 62  Resp: 18   Temp: 98.2 F (36.8 C)   SpO2: 100%     Intake/Output Summary (Last 24 hours) at 05/18/2021 1037 Last data filed at 05/18/2021 1020 Gross per 24 hour  Intake 437 ml  Output 300 ml  Net 137 ml   Filed Weights   05/10/21 0310 05/11/21 1021 05/17/21 0600  Weight: 117.5 kg 110.3 kg 110.2 kg   Body mass index is 40.44 kg/m.  Exam:  General: Fatigued, oriented x3 HEENT: Normocephalic and atraumatic, mucous membranes slightly dry Cardiovascular: Regular rate and rhythm, S1-S2, 2 out of 6 systolic ejection murmur Respiratory: Decreased breath sounds throughout Abdomen: Soft, obese, nontender, positive bowel sounds Musculoskeletal: No clubbing or cyanosis, trace pitting edema Psychiatry: Appropriate, no evidence of psychoses Neurology: No focal deficits   Data Reviewed: CBC: Recent Labs  Lab 05/12/21 0429 05/13/21 0524 05/14/21 0601 05/15/21 2055 05/17/21 0442 05/18/21 0446  WBC 4.3 4.6 4.1 12.4* 15.1* 11.9*  NEUTROABS 2.7  --   --  10.6* 12.4*  --   HGB 8.5* 8.5* 7.9* 8.4* 7.6* 7.3*  HCT 27.7* 27.3* 25.8* 27.4* 25.8* 24.5*  MCV 89.6 88.9 89.0 87.8 91.8 91.8  PLT 266 263 274 312 290 342   Basic Metabolic Panel: Recent Labs  Lab 05/12/21 0428 05/12/21 1017 05/13/21 0524 05/14/21 0601 05/15/21 0449 05/16/21 0516 05/17/21 0442 05/18/21 0446  NA 136  --  137  139 140 137 136 136 136  K 3.2*  --  3.5  3.6 3.4* 3.5 3.6 3.6 3.4*  CL 94*  --  94*  96* 93* 92* 91* 92* 91*  CO2 34*  --  34*  35* 37* 36* 37* 34* 33*  GLUCOSE 162*  --  193*  192* 201* 253* 143* 191* 177*  BUN 86*  --  82*  90*  93* 88* 84* 105* 120*  CREATININE 3.25*  --  3.19*  3.29* 3.20* 3.25* 3.93* 4.48* 5.11*  CALCIUM 7.7*  --  8.0*  8.1* 8.4* 8.5* 8.3* 8.1* 8.0*  MG  --  2.0 2.3 2.1  --  2.3  --  2.1  PHOS 4.8*  --  3.4  3.7 3.9  4.1 3.6  --   --  5.8*   GFR: Estimated Creatinine Clearance: 14.8 mL/min (A) (by C-G formula based on SCr of 5.11 mg/dL (H)). Liver Function Tests: Recent Labs  Lab 05/12/21 0428 05/13/21 0524 05/14/21 0601 05/15/21 0449 05/17/21 0442 05/17/21 1350  AST  --   --   --   --  42*  --   ALT  --   --   --   --  58*  --   ALKPHOS  --   --   --   --  70  --   BILITOT  --   --   --   --  0.9  --   PROT  --   --   --   --  6.8 7.1  ALBUMIN 1.9* 1.9* 2.0* 2.1* 1.8*  --  No results for input(s): LIPASE, AMYLASE in the last 168 hours. No results for input(s): AMMONIA in the last 168 hours. Coagulation Profile: No results for input(s): INR, PROTIME in the last 168 hours. Cardiac Enzymes: No results for input(s): CKTOTAL, CKMB, CKMBINDEX, TROPONINI in the last 168 hours. BNP (last 3 results) No results for input(s): PROBNP in the last 8760 hours. HbA1C: No results for input(s): HGBA1C in the last 72 hours. CBG: Recent Labs  Lab 05/17/21 0755 05/17/21 1146 05/17/21 1617 05/17/21 2048 05/18/21 0903  GLUCAP 204* 266* 207* 179* 172*   Lipid Profile: No results for input(s): CHOL, HDL, LDLCALC, TRIG, CHOLHDL, LDLDIRECT in the last 72 hours. Thyroid Function Tests: No results for input(s): TSH, T4TOTAL, FREET4, T3FREE, THYROIDAB in the last 72 hours. Anemia Panel: No results for input(s): VITAMINB12, FOLATE, FERRITIN, TIBC, IRON, RETICCTPCT in the last 72 hours. Urine analysis:    Component Value Date/Time   COLORURINE PINK (A) 05/16/2021 0124   APPEARANCEUR CLOUDY (A) 05/16/2021 0124   LABSPEC 1.005 05/16/2021 0124   PHURINE 5.0 05/16/2021 0124   GLUCOSEU NEGATIVE 05/16/2021 0124   HGBUR MODERATE (A) 05/16/2021 0124   BILIRUBINUR NEGATIVE 05/16/2021 0124    BILIRUBINUR neg 12/06/2010 1627   KETONESUR NEGATIVE 05/16/2021 0124   PROTEINUR NEGATIVE 05/16/2021 0124   UROBILINOGEN 0.2 12/06/2010 1627   NITRITE NEGATIVE 05/16/2021 0124   LEUKOCYTESUR TRACE (A) 05/16/2021 0124   Sepsis Labs: @LABRCNTIP (procalcitonin:4,lacticidven:4)  ) Recent Results (from the past 240 hour(s))  Body fluid culture w Gram Stain     Status: None (Preliminary result)   Collection Time: 05/17/21 11:05 AM   Specimen: PATH Cytology Pleural fluid  Result Value Ref Range Status   Specimen Description   Final    PLEURAL Performed at [facility], [address], [place], [place] [address]    Special Requests   Final    PLEURAL Performed at [facility], [address] [place], [place] [address]    Gram Stain   Final    FEW WBC PRESENT, PREDOMINANTLY MONONUCLEAR NO ORGANISMS SEEN    Culture   Final    NO GROWTH < 24 HOURS Performed at [facility], [place] [address], [place], [place] [address]    Report Status PENDING  Incomplete  MRSA Next Gen by PCR, Nasal     Status: None   Collection Time: 05/17/21  8:49 PM   Specimen: Nasal Mucosa; Nasal Swab  Result Value Ref Range Status   MRSA by PCR Next Gen NOT DETECTED NOT DETECTED Final    Comment: (NOTE) The GeneXpert MRSA Assay (FDA approved for NASAL specimens only), is one component of a comprehensive MRSA colonization surveillance program. It is not intended to diagnose MRSA infection nor to guide or monitor treatment for MRSA infections. Test performance is not FDA approved in patients less than 63 years old. Performed at [facility], [address] [place], [place] [address]   Resp Panel by RT-PCR (Flu A&B, Covid) Nasal Mucosa     Status: Abnormal   Collection Time: 05/17/21  8:49 PM   Specimen: Nasal Mucosa; Nasopharyngeal(NP) swabs in vial transport medium  Result Value Ref Range Status   SARS Coronavirus 2 by RT PCR POSITIVE (A) NEGATIVE Final    Comment:  RESULT CALLED TO, READ BACK BY AND VERIFIED WITH: Marissa Belos @2228  on 05/17/21 skl (NOTE) SARS-CoV-2 target nucleic acids are DETECTED.  The SARS-CoV-2 RNA is generally detectable in upper respiratory specimens during the acute phase  of infection. Positive results are indicative of the presence of the identified virus, but do not rule out bacterial infection or co-infection with other pathogens not detected by the test. Clinical correlation with patient history and other diagnostic information is necessary to determine patient infection status. The expected result is Negative.  Fact Sheet for Patients: EntrepreneurPulse.com.au  Fact Sheet for Healthcare Providers: IncredibleEmployment.be  This test is not yet approved or cleared by the [place] FDA and  has been authorized for detection and/or diagnosis of SARS-CoV-2 by FDA under an Emergency Use Authorization (EUA).  This EUA will remain in effect (meaning this test can  be used) for the duration of  the COVID-19 declaration under Section 564(b)(1) of the Act, 21 U.S.C. section 360bbb-3(b)(1), unless the authorization is terminated or revoked sooner.     Influenza A by PCR NEGATIVE NEGATIVE Final   Influenza B by PCR NEGATIVE NEGATIVE Final    Comment: (NOTE) The Xpert Xpress SARS-CoV-2/FLU/RSV plus assay is intended as an aid in the diagnosis of influenza from Nasopharyngeal swab specimens and should not be used as a sole basis for treatment. Nasal washings and aspirates are unacceptable for Xpert Xpress SARS-CoV-2/FLU/RSV testing.  Fact Sheet for Patients: EntrepreneurPulse.com.au  Fact Sheet for Healthcare Providers: IncredibleEmployment.be  This test is not yet approved or cleared by the [place] FDA and has been authorized for detection and/or diagnosis of SARS-CoV-2 by FDA under an Emergency Use Authorization (EUA). This EUA will  remain in effect (meaning this test can be used) for the duration of the COVID-19 declaration under Section 564(b)(1) of the Act, 21 U.S.C. section 360bbb-3(b)(1), unless the authorization is terminated or revoked.  Performed at [facility] [address] [place], [place] [address]       Studies: [facility] Chest [place] 1 View  Result Date: 05/18/2021 CLINICAL DATA:  Acute respiratory failure with hypoxia and hypercarbia. EXAM: PORTABLE CHEST 1 VIEW COMPARISON:  May 17, 2021. FINDINGS: Low lung volumes. Persistent left basilar opacity. No visible pneumothorax on this semi erect radiograph. Cardiac silhouette is similar. Partially imaged right shoulder degenerative change. IMPRESSION: 1. Persistent left basilar opacity, which may reflect any combination of atelectasis/consolidation and layering pleural effusion. 2. Pulmonary vascular congestion. Electronically Signed   By: Margaretha Sheffield M.D.   On: 05/18/2021 08:05   DG Chest Port 1 View  Result Date: 05/17/2021 CLINICAL DATA:  Post thoracentesis EXAM: PORTABLE CHEST 1 VIEW COMPARISON:  05/15/2021 FINDINGS: No pneumothorax. Low lung volumes. Remains opacification at the left lung base. Increased right basilar atelectasis. IMPRESSION: No pneumothorax. Remains left lung base opacification, which may reflect combination atelectasis/consolidation and residual pleural fluid. Increased right basilar atelectasis. Electronically Signed   By: Macy Mis M.D.   On: 05/17/2021 11:55   US THORACENTESIS ASP PLEURAL SPACE W/IMG GUIDE  Result Date: 05/17/2021 INDICATION: Shortness of breath with left pleural effusion, concern for infection. EXAM: ULTRASOUND GUIDED LEFT THORACENTESIS MEDICATIONS: Local 1% lidocaine only. COMPLICATIONS: None immediate. PROCEDURE: An ultrasound guided thoracentesis was thoroughly discussed with the patient and questions answered. The benefits, risks, alternatives and complications were also discussed. The  patient understands and wishes to proceed with the procedure. Written consent was obtained. Ultrasound was performed to localize and mark an adequate pocket of fluid in the left chest. The area was then prepped and draped in the normal sterile fashion. 1% Lidocaine was used for local anesthesia. Under ultrasound guidance a 19 gauge, 7-cm, Yueh catheter was introduced. Thoracentesis was performed. The catheter was removed  and a dressing applied. FINDINGS: A total of approximately 700 mL of blood tinged fluid was removed. Samples were sent to the laboratory as requested by the clinical team. IMPRESSION: Successful ultrasound guided left thoracentesis yielding 700 mL of pleural fluid. Read By: Tsosie Billing PA-C Electronically Signed   By: Albin Felling M.D.   On: 05/17/2021 11:24    Scheduled Meds:  acetaminophen  650 mg Oral TID   acetylcysteine  2 mL Nebulization STAT   aspirin EC  81 mg Oral Daily   atorvastatin  80 mg Oral QHS   calcitRIOL  0.25 mcg Oral Daily   carvedilol  12.5 mg Oral BID WC   Chlorhexidine Gluconate Cloth  6 each Topical Daily   cloNIDine  0.1 mg Oral TID   clopidogrel  75 mg Oral Daily   dorzolamide-timolol  1 drop Both Eyes BID   epoetin (EPOGEN/PROCRIT) injection  3,000 Units Subcutaneous Q14 Days   escitalopram  5 mg Oral Daily   ezetimibe  10 mg Oral Daily   feeding supplement  237 mL Oral TID BM   fluticasone furoate-vilanterol  1 puff Inhalation Daily   And   umeclidinium bromide  1 puff Inhalation Daily   gabapentin  100 mg Oral BID   guaiFENesin  1,200 mg Oral BID   heparin injection (subcutaneous)  5,000 Units Subcutaneous Q8H   hydrALAZINE  10 mg Oral TID   insulin aspart  0-20 Units Subcutaneous TID WC   insulin aspart  0-5 Units Subcutaneous QHS   insulin aspart  3 Units Subcutaneous TID WC   insulin detemir  30 Units Subcutaneous QHS   ipratropium-albuterol  3 mL Nebulization Q6H   isosorbide mononitrate  120 mg Oral Daily   levothyroxine  50 mcg  Oral Q0600   methylPREDNISolone (SOLU-MEDROL) injection  0.5 mg/kg Intravenous Q12H   Followed by   Derrill Memo ON 05/19/2021] predniSONE  50 mg Oral Daily   montelukast  10 mg Oral Daily   multivitamin with minerals  1 tablet Oral Daily   pantoprazole  40 mg Oral Daily   sodium chloride flush  3 mL Intravenous Q12H   sodium chloride HYPERTONIC  4 mL Nebulization BID   tamsulosin  0.4 mg Oral QHS   cyanocobalamin  1,000 mcg Oral Daily    Continuous Infusions:  sodium chloride 10 mL/hr at 05/13/21 1614   meropenem (MERREM) IV Stopped (05/18/21 7673)   remdesivir 200 mg in sodium chloride 0.9% 250 mL IVPB     Followed by   Derrill Memo ON 05/19/2021] remdesivir 100 mg in NS 100 mL       LOS: 15 days     Annita Brod, MD [facility]   05/18/2021, 10:37 AM

## 2021-05-18 NOTE — Progress Notes (Signed)
OT Cancellation Note  Patient Details Name: Stephen [name] MRN: [id number] DOB: [birth year]   Cancelled Treatment:    Reason Eval/Treat Not Completed: Patient at procedure or test/ unavailable; Palliative care in room with patient when tx is attempted; OT will re-attempt as able.   Shanon Payor, OTD OTR/L  05/18/21, 4:19 PM

## 2021-05-19 DIAGNOSIS — N184 Chronic kidney disease, stage 4 (severe): Secondary | ICD-10-CM | POA: Diagnosis not present

## 2021-05-19 DIAGNOSIS — I5033 Acute on chronic diastolic (congestive) heart failure: Secondary | ICD-10-CM | POA: Diagnosis not present

## 2021-05-19 DIAGNOSIS — R338 Other retention of urine: Secondary | ICD-10-CM | POA: Diagnosis not present

## 2021-05-19 DIAGNOSIS — J9601 Acute respiratory failure with hypoxia: Secondary | ICD-10-CM | POA: Diagnosis not present

## 2021-05-19 DIAGNOSIS — Z9889 Other specified postprocedural states: Secondary | ICD-10-CM

## 2021-05-19 LAB — C-REACTIVE PROTEIN: CRP: 13.1 mg/dL — ABNORMAL HIGH (ref <1.0)

## 2021-05-19 LAB — FERRITIN: Ferritin: 890 ng/mL — ABNORMAL HIGH (ref 24–336)

## 2021-05-19 LAB — LACTATE DEHYDROGENASE: LDH: 157 U/L (ref 98–192)

## 2021-05-19 LAB — GLUCOSE, CAPILLARY
Glucose-Capillary: 268 mg/dL — ABNORMAL HIGH (ref 70–99)
Glucose-Capillary: 301 mg/dL — ABNORMAL HIGH (ref 70–99)

## 2021-05-19 MED ORDER — HALOPERIDOL LACTATE 2 MG/ML PO CONC
0.5000 mg | ORAL | Status: DC | PRN
  Filled 2021-05-19: qty 0.3

## 2021-05-19 MED ORDER — ONDANSETRON HCL 4 MG/2ML IJ SOLN
4.0000 mg | Freq: Four times a day (QID) | INTRAMUSCULAR | Status: DC | PRN
  Administered 2021-05-20 (×2): 4 mg via INTRAVENOUS
  Filled 2021-05-19 (×2): qty 2

## 2021-05-19 MED ORDER — ONDANSETRON 4 MG PO TBDP
4.0000 mg | ORAL_TABLET | Freq: Four times a day (QID) | ORAL | Status: DC | PRN
  Filled 2021-05-19: qty 1

## 2021-05-19 MED ORDER — GLYCOPYRROLATE 0.2 MG/ML IJ SOLN
0.2000 mg | INTRAMUSCULAR | Status: DC | PRN
  Filled 2021-05-19: qty 1

## 2021-05-19 MED ORDER — GLYCOPYRROLATE 0.2 MG/ML IJ SOLN
0.2000 mg | INTRAMUSCULAR | Status: DC | PRN
  Administered 2021-05-19 – 2021-05-20 (×3): 0.2 mg via INTRAVENOUS
  Filled 2021-05-19 (×4): qty 1

## 2021-05-19 MED ORDER — ACETAMINOPHEN 650 MG RE SUPP
650.0000 mg | Freq: Four times a day (QID) | RECTAL | Status: DC | PRN
  Filled 2021-05-19: qty 1

## 2021-05-19 MED ORDER — HALOPERIDOL LACTATE 5 MG/ML IJ SOLN
0.5000 mg | INTRAMUSCULAR | Status: DC | PRN
  Administered 2021-05-19 – 2021-05-20 (×4): 0.5 mg via INTRAVENOUS
  Filled 2021-05-19 (×4): qty 1

## 2021-05-19 MED ORDER — MORPHINE SULFATE (PF) 2 MG/ML IV SOLN
1.0000 mg | INTRAVENOUS | Status: DC | PRN
  Administered 2021-05-19 – 2021-05-20 (×9): 2 mg via INTRAVENOUS
  Filled 2021-05-19 (×9): qty 1

## 2021-05-19 MED ORDER — LORAZEPAM 2 MG/ML IJ SOLN
1.0000 mg | INTRAMUSCULAR | Status: DC | PRN
  Administered 2021-05-19 – 2021-05-20 (×5): 1 mg via INTRAVENOUS
  Filled 2021-05-19 (×5): qty 1

## 2021-05-19 MED ORDER — BIOTENE DRY MOUTH MT LIQD: 15.0000 mL | OROMUCOSAL | Status: DC | PRN

## 2021-05-19 MED ORDER — SENNA 8.6 MG PO TABS: 1.0000 | ORAL_TABLET | Freq: Every evening | ORAL | Status: DC | PRN

## 2021-05-19 MED ORDER — BISACODYL 10 MG RE SUPP: 10.0000 mg | Freq: Every day | RECTAL | Status: DC | PRN

## 2021-05-19 MED ORDER — LORAZEPAM 2 MG/ML PO CONC: 1.0000 mg | ORAL | Status: DC | PRN

## 2021-05-19 MED ORDER — POLYVINYL ALCOHOL 1.4 % OP SOLN
1.0000 [drp] | Freq: Four times a day (QID) | OPHTHALMIC | Status: DC | PRN
  Filled 2021-05-19: qty 15

## 2021-05-19 MED ORDER — HALOPERIDOL 0.5 MG PO TABS
0.5000 mg | ORAL_TABLET | ORAL | Status: DC | PRN
  Filled 2021-05-19: qty 1

## 2021-05-19 MED ORDER — GLYCOPYRROLATE 1 MG PO TABS
1.0000 mg | ORAL_TABLET | ORAL | Status: DC | PRN
  Filled 2021-05-19: qty 1

## 2021-05-19 MED ORDER — LORAZEPAM 1 MG PO TABS: 1.0000 mg | ORAL_TABLET | ORAL | Status: DC | PRN

## 2021-05-19 MED ORDER — ACETAMINOPHEN 325 MG PO TABS: 650.0000 mg | ORAL_TABLET | Freq: Four times a day (QID) | ORAL | Status: DC | PRN

## 2021-05-19 NOTE — Progress Notes (Signed)
[facility]  PROGRESS NOTE   Subjective:   Wife at bedside   Family met with palliative yesterday.   Changed to IV unasyn.   No new labs today.    Objective:  Vital signs in last 24 hours:  Temp:  [97.6 F (36.4 C)-99 F (37.2 C)] 98 F (36.7 C) (11/17 1210) Pulse Rate:  [58-80] 62 (11/17 1210) Resp:  [18-20] 18 (11/17 1210) BP: (111-137)/(54-66) 132/60 (11/17 1210) SpO2:  [93 %-100 %] 98 % (11/17 1210) FiO2 (%):  [36 %] 36 % (11/17 0811) Weight:  [110 kg] 110 kg (11/17 0500)  Weight change:  Filed Weights   05/11/21 1021 05/17/21 0600 05/19/21 0500  Weight: 110.3 kg 110.2 kg 110 kg    Intake/Output: I/O last 3 completed shifts: In: 2474.5 [P.O.:477; I.V.:1307.5; IV Piggyback:690] Out: 600 [Urine:600]   Intake/Output this shift:  Total I/O In: -  Out: 650 [Urine:650]  Physical Exam: General:  No acute distress, laying in bed  Head:  Normocephalic, atraumatic. Moist oral mucosal membranes  Eyes:  Anicteric  Neck:  Supple  Lungs:   Clear to auscultation, normal effort  Heart:  regular  Abdomen:   Soft, nontender, bowel sounds present  Extremities:  No peripheral edema.  Neurologic:  Slow to respond, following commands, lip tremor+  Skin:  No lesions  GU Foley catheter with dark yellow urine    Basic Metabolic Panel: Recent Labs  Lab 05/13/21 0524 05/14/21 0601 05/15/21 0449 05/16/21 0516 05/17/21 0442 05/18/21 0446  NA 137  139 140 137 136 136 136  K 3.5  3.6 3.4* 3.5 3.6 3.6 3.4*  CL 94*  96* 93* 92* 91* 92* 91*  CO2 34*  35* 37* 36* 37* 34* 33*  GLUCOSE 193*  192* 201* 253* 143* 191* 177*  BUN 82*  90* 93* 88* 84* 105* 120*  CREATININE 3.19*  3.29* 3.20* 3.25* 3.93* 4.48* 5.11*  CALCIUM 8.0*  8.1* 8.4* 8.5* 8.3* 8.1* 8.0*  MG 2.3 2.1  --  2.3  --  2.1  PHOS 3.4  3.7 3.9  4.1 3.6  --   --  5.8*     CBC: Recent Labs  Lab 05/13/21 0524 05/14/21 0601 05/15/21 2055 05/17/21 0442 05/18/21 0446  WBC 4.6 4.1 12.4*  15.1* 11.9*  NEUTROABS  --   --  10.6* 12.4*  --   HGB 8.5* 7.9* 8.4* 7.6* 7.3*  HCT 27.3* 25.8* 27.4* 25.8* 24.5*  MCV 88.9 89.0 87.8 91.8 91.8  PLT 263 274 312 290 310      Urinalysis: No results for input(s): COLORURINE, LABSPEC, PHURINE, GLUCOSEU, HGBUR, BILIRUBINUR, KETONESUR, PROTEINUR, UROBILINOGEN, NITRITE, LEUKOCYTESUR in the last 72 hours.  Invalid input(s): APPERANCEUR     Imaging: DG Chest Port 1 View  Result Date: 05/18/2021 CLINICAL DATA:  Acute respiratory failure with hypoxia and hypercarbia. EXAM: PORTABLE CHEST 1 VIEW COMPARISON:  May 17, 2021. FINDINGS: Low lung volumes. Persistent left basilar opacity. No visible pneumothorax on this semi erect radiograph. Cardiac silhouette is similar. Partially imaged right shoulder degenerative change. IMPRESSION: 1. Persistent left basilar opacity, which may reflect any combination of atelectasis/consolidation and layering pleural effusion. 2. Pulmonary vascular congestion. Electronically Signed   By: Margaretha Sheffield M.D.   On: 05/18/2021 08:05     Medications:    sodium chloride 10 mL/hr at 05/13/21 1614   ampicillin-sulbactam (UNASYN) IV 3 g (05/19/21 0507)   remdesivir 100 mg in NS 100 mL 100 mg (05/19/21 0841)  acetaminophen  650 mg Oral TID   aspirin EC  81 mg Oral Daily   atorvastatin  80 mg Oral QHS   calcitRIOL  0.25 mcg Oral Daily   carvedilol  12.5 mg Oral BID WC   Chlorhexidine Gluconate Cloth  6 each Topical Daily   cloNIDine  0.1 mg Oral TID   clopidogrel  75 mg Oral Daily   dorzolamide-timolol  1 drop Both Eyes BID   epoetin (EPOGEN/PROCRIT) injection  3,000 Units Subcutaneous Q14 Days   escitalopram  5 mg Oral Daily   ezetimibe  10 mg Oral Daily   feeding supplement  237 mL Oral TID BM   fluticasone furoate-vilanterol  1 puff Inhalation Daily   And   umeclidinium bromide  1 puff Inhalation Daily   gabapentin  100 mg Oral BID   guaiFENesin  1,200 mg Oral BID   heparin injection  (subcutaneous)  5,000 Units Subcutaneous Q8H   hydrALAZINE  10 mg Oral TID   insulin aspart  0-20 Units Subcutaneous TID WC   insulin aspart  0-5 Units Subcutaneous QHS   insulin aspart  3 Units Subcutaneous TID WC   insulin detemir  30 Units Subcutaneous QHS   Ipratropium-Albuterol  1 puff Inhalation Q6H   isosorbide mononitrate  120 mg Oral Daily   levothyroxine  50 mcg Oral Q0600   methylPREDNISolone (SOLU-MEDROL) injection  0.5 mg/kg Intravenous Q12H   Followed by   Derrill Memo ON 05/07/2021] predniSONE  50 mg Oral Daily   montelukast  10 mg Oral Daily   multivitamin with minerals  1 tablet Oral Daily   pantoprazole  40 mg Oral Daily   sodium chloride flush  3 mL Intravenous Q12H   sodium chloride HYPERTONIC  4 mL Nebulization BID   tamsulosin  0.4 mg Oral QHS   cyanocobalamin  1,000 mcg Oral Daily    Assessment/ Plan:     Principal Problem:   Acute on chronic diastolic CHF (congestive heart failure) (HCC) Active Problems:   Elevated troponin   Unstable angina (HCC)   Pulmonary hypertension (HCC)   Acute on chronic respiratory failure with hypoxia and hypercapnia (HCC)   CKD (chronic kidney disease), stage IV (HCC)   Interstitial lung disease (HCC)   B12 deficiency   Pressure injury of skin   Moderate major depression, single episode (HCC)   Atypical chest pain   SOB (shortness of breath)  Mr. PIKE SCANTLEBURY is a 75 y.o. white male with hypertension, coronary artery disease, congestive heart failure, diabetes mellitus type II, peripheral vascular disease, COPD who is admitted to [facility] on 04/18/2021 for Unstable angina ([place]) [I20.0] CHF (congestive heart failure) ([place]) [I50.9] Pain [R52] Atypical chest pain [R07.89] NSTEMI (non-ST elevated myocardial infarction) ([place]) [I21.4]  Acute kidney injury on chronic kidney disease stage IV baseline creatinine of 2.92 GFR of 22 on 02/24/21. Acute kidney injury secondary to obstructive uropathy. Followed by Dr. Holley Raring as outpatient.   Concern for overdiuresis. Discontinued acetazolamide and furosemide.  Patient is not a candidate for long term dialysis with his comorbidities.   Hypertension and acute exacerbation of chronic diastolic congestive heart failure. Currently holding furosemide and actazolamide. Home regimen of clonidine, torsemide, carvedilol, isosorbide mononitrate and tamsulosin. Status post Left thoracentesis on 11/15.  Continue to hold diuretics as above.  Continue tamsulosin, carvedilol, hydralazine, and clonidine.   Anemia with chronic kidney disease: hemoglobin 7.3. Patient received EPO subcutaneous on 11/7.   Hematuria: Urology on 11/12 determined that hematuria was due to foley catheterization.  Patient underwent manual irrigations. Currently with no gross hematuria.    Secondary Hyperparathyroidism: PTH 182 on 02/08/21. Calcium and phosphorus at goal on most recent labs.  - calcitriol  Overall prognosis is poor. Appreciate palliative care.    LOS: [place], [facility] 11/17/202212:42 PM

## 2021-05-19 NOTE — Progress Notes (Signed)
PROGRESS NOTE  Stephen [name] DQQ:[id number] DOB: [birth year] DOA: 04/12/2021 PCP: [name], MD  HPI/Recap of past 32 hours: 75 year old male with past medical history for chronic interstitial lung disease, pulmonary hypertension, morbid obesity, obstructive sleep apnea with chronic respiratory failure with hypoxia, CAD, diabetes mellitus and stage IV chronic kidney disease with recent hospitalization a month prior for non-STEMI and heart failure who started experiencing chest pain and shortness of breath 2 days after discharge patient noted to be hypoxic and brought into the emergency room.  Felt to be in volume overload and admitted to the hospitalist service on 10/31.  Since hospitalization, he has been diuresed which initially improved.  Hospitalization also complicated by urinary retention and hematuria with Foley catheter placement by urology.  Also noted to have some confusion felt to be secondary to hypercarbia and parapneumonic effusion.  Pulmonary consulted for worsening hypoxia and CT scan of chest noted complete left lung atelectasis.  Patient underwent left-sided thoracentesis with 700 cc of fluid removed.  Nephrology has been following for worsening kidney injury.  Patient made DNR on 11/15.  Palliative care has been asked to see.  After having increased cough, patient ended up testing positive for COVID, likely from family who have also gotten COVID.  Patient overall continues to do poorly.  After meetings with palliative care, patient has discussed with family and will be transitioning to comfort care today.  Assessment/Plan: Principal Problem:   Acute on chronic respiratory failure with hypoxia and hypercapnia (HCC) in the setting of chronic interstitial lung disease, parapneumonic effusion, pulmonary hypertension, acute on chronic diastolic heart failure, left lung atelectasis felt to be secondary from mucous plugging, healthcare associated pneumonia and COVID-19  infection.: Greatly appreciate critical care help.  Patient has been on steroids, Remdisivir, IV meropenem.  Will likely discontinue these as he has become comfort care  Acute kidney injury in the setting of stage IV chronic kidney disease: Nephrology following.  Felt to be secondary to obstructive uropathy as well as overdiuresis.  Not long-term candidate for dialysis.  Now comfort care.    B12 deficiency: Replacement   Pressure injury of skin:  Pressure Injury 05/04/21 Buttocks Right;Left Stage 2 -  Partial thickness loss of dermis presenting as a shallow open injury with a red, pink wound bed without slough. Pressure wound on buttocks (Active)  05/04/21 1145  Location: Buttocks  Location Orientation: Right;Left  Staging: Stage 2 -  Partial thickness loss of dermis presenting as a shallow open injury with a red, pink wound bed without slough.  Wound Description (Comments): Pressure wound on buttocks  Present on Admission: Yes     Pressure Injury 05/11/21 Buttocks Right Stage 2 -  Partial thickness loss of dermis presenting as a shallow open injury with a red, pink wound bed without slough. (Active)  05/11/21 1000  Location: Buttocks  Location Orientation: Right  Staging: Stage 2 -  Partial thickness loss of dermis presenting as a shallow open injury with a red, pink wound bed without slough.  Wound Description (Comments):   Present on Admission:      Pressure Injury 05/11/21 Buttocks Left Stage 2 -  Partial thickness loss of dermis presenting as a shallow open injury with a red, pink wound bed without slough. (Active)  05/11/21 1000  Location: Buttocks  Location Orientation: Left  Staging: Stage 2 -  Partial thickness loss of dermis presenting as a shallow open injury with a red, pink wound bed without slough.  Wound Description (Comments):  Present on Admission:      Moderate major depression, single episode [facility]): Seen by psychiatry.  Hypothyroidism: Continue  Synthroid  Diabetes mellitus: Sliding scale, insulin  Anemia of chronic disease: Has been Epogen  Code Status: DNR, now comfort care  Family Communication: Wife at the bedside  Disposition Plan: Now being made comfort care   Consultants: Palliative care Urology Critical care/pulmonary Nephrology Psychiatry  Procedures: Placement of Foley catheter  Antimicrobials: Remdisivir IV meropenem  DVT prophylaxis: Subcu heparin  Level of care: Progressive Cardiac   Objective: Vitals:   05/19/21 0900 05/19/21 1210  BP: 137/61 132/60  Pulse: 64 62  Resp: 18 18  Temp: 97.8 F (36.6 C) 98 F (36.7 C)  SpO2: 97% 98%    Intake/Output Summary (Last 24 hours) at 05/19/2021 1350 Last data filed at 05/19/2021 0900 Gross per 24 hour  Intake 679.83 ml  Output 650 ml  Net 29.83 ml    Filed Weights   05/11/21 1021 05/17/21 0600 05/19/21 0500  Weight: 110.3 kg 110.2 kg 110 kg   Body mass index is 40.36 kg/m.  Exam:  General: Fatigued, oriented x3 HEENT: Normocephalic and atraumatic, mucous membranes slightly dry Cardiovascular: Regular rate and rhythm, S1-S2, 2 out of 6 systolic ejection murmur Respiratory: Decreased breath sounds throughout Abdomen: Soft, obese, nontender, positive bowel sounds Musculoskeletal: No clubbing or cyanosis, trace pitting edema Psychiatry: Appropriate, no evidence of psychoses Neurology: No focal deficits   Data Reviewed: CBC: Recent Labs  Lab 05/13/21 0524 05/14/21 0601 05/15/21 2055 05/17/21 0442 05/18/21 0446  WBC 4.6 4.1 12.4* 15.1* 11.9*  NEUTROABS  --   --  10.6* 12.4*  --   HGB 8.5* 7.9* 8.4* 7.6* 7.3*  HCT 27.3* 25.8* 27.4* 25.8* 24.5*  MCV 88.9 89.0 87.8 91.8 91.8  PLT 263 274 312 290 536    Basic Metabolic Panel: Recent Labs  Lab 05/13/21 0524 05/14/21 0601 05/15/21 0449 05/16/21 0516 05/17/21 0442 05/18/21 0446  NA 137  139 140 137 136 136 136  K 3.5  3.6 3.4* 3.5 3.6 3.6 3.4*  CL 94*  96* 93* 92* 91*  92* 91*  CO2 34*  35* 37* 36* 37* 34* 33*  GLUCOSE 193*  192* 201* 253* 143* 191* 177*  BUN 82*  90* 93* 88* 84* 105* 120*  CREATININE 3.19*  3.29* 3.20* 3.25* 3.93* 4.48* 5.11*  CALCIUM 8.0*  8.1* 8.4* 8.5* 8.3* 8.1* 8.0*  MG 2.3 2.1  --  2.3  --  2.1  PHOS 3.4  3.7 3.9  4.1 3.6  --   --  5.8*    GFR: Estimated Creatinine Clearance: 14.7 mL/min (A) (by C-G formula based on SCr of 5.11 mg/dL (H)). Liver Function Tests: Recent Labs  Lab 05/13/21 0524 05/14/21 0601 05/15/21 0449 05/17/21 0442 05/17/21 1350  AST  --   --   --  42*  --   ALT  --   --   --  58*  --   ALKPHOS  --   --   --  70  --   BILITOT  --   --   --  0.9  --   PROT  --   --   --  6.8 7.1  ALBUMIN 1.9* 2.0* 2.1* 1.8*  --     No results for input(s): LIPASE, AMYLASE in the last 168 hours. No results for input(s): AMMONIA in the last 168 hours. Coagulation Profile: No results for input(s): INR, PROTIME in the last 168 hours.  Cardiac Enzymes: No results for input(s): CKTOTAL, CKMB, CKMBINDEX, TROPONINI in the last 168 hours. BNP (last 3 results) No results for input(s): PROBNP in the last 8760 hours. HbA1C: No results for input(s): HGBA1C in the last 72 hours. CBG: Recent Labs  Lab 05/18/21 0903 05/18/21 1322 05/18/21 1623 05/19/21 0822 05/19/21 1208  GLUCAP 172* 210* 260* 268* 301*    Lipid Profile: No results for input(s): CHOL, HDL, LDLCALC, TRIG, CHOLHDL, LDLDIRECT in the last 72 hours. Thyroid Function Tests: No results for input(s): TSH, T4TOTAL, FREET4, T3FREE, THYROIDAB in the last 72 hours. Anemia Panel: Recent Labs    05/19/21 0540  FERRITIN 890*   Urine analysis:    Component Value Date/Time   COLORURINE PINK (A) 05/16/2021 0124   APPEARANCEUR CLOUDY (A) 05/16/2021 0124   LABSPEC 1.005 05/16/2021 0124   PHURINE 5.0 05/16/2021 0124   GLUCOSEU NEGATIVE 05/16/2021 0124   HGBUR MODERATE (A) 05/16/2021 0124   BILIRUBINUR NEGATIVE 05/16/2021 0124   BILIRUBINUR neg 12/06/2010  1627   KETONESUR NEGATIVE 05/16/2021 0124   PROTEINUR NEGATIVE 05/16/2021 0124   UROBILINOGEN 0.2 12/06/2010 1627   NITRITE NEGATIVE 05/16/2021 0124   LEUKOCYTESUR TRACE (A) 05/16/2021 0124   Sepsis Labs: @LABRCNTIP (procalcitonin:4,lacticidven:4)  ) Recent Results (from the past 240 hour(s))  Acid Fast Smear (AFB)     Status: None   Collection Time: 05/17/21 11:05 AM   Specimen: PATH Cytology Pleural fluid  Result Value Ref Range Status   AFB Specimen Processing Concentration  Final   Acid Fast Smear Negative  Final    Comment: (NOTE) Performed At: [facility] [address] [place], [place] [id number] Rush Farmer MD MC:[id number]    Source (AFB) PLEURAL  Final    Comment: Performed at [facility], [address] [place], [place] [address]  Body fluid culture w Gram Stain     Status: None (Preliminary result)   Collection Time: 05/17/21 11:05 AM   Specimen: PATH Cytology Pleural fluid  Result Value Ref Range Status   Specimen Description   Final    PLEURAL Performed at [facility], [address] [place], [place] [address]    Special Requests   Final    PLEURAL Performed at [facility], [address] [place], [place] [address]    Gram Stain   Final    FEW WBC PRESENT, PREDOMINANTLY MONONUCLEAR NO ORGANISMS SEEN    Culture   Final    NO GROWTH 2 DAYS Performed at [facility], [place] [address], [place], [place] [address]    Report Status PENDING  Incomplete  MRSA Next Gen by PCR, Nasal     Status: None   Collection Time: 05/17/21  8:49 PM   Specimen: Nasal Mucosa; Nasal Swab  Result Value Ref Range Status   MRSA by PCR Next Gen NOT DETECTED NOT DETECTED Final    Comment: (NOTE) The GeneXpert MRSA Assay (FDA approved for NASAL specimens only), is one component of a comprehensive MRSA colonization surveillance program. It is not intended to diagnose MRSA infection nor to guide or monitor treatment for MRSA  infections. Test performance is not FDA approved in patients less than 61 years old. Performed at [facility], [address] [place], [place] [address]   Resp Panel by RT-PCR (Flu A&B, Covid) Nasal Mucosa     Status: Abnormal   Collection Time: 05/17/21  8:49 PM   Specimen: Nasal Mucosa; Nasopharyngeal(NP) swabs in vial transport medium  Result Value Ref Range  Status   SARS Coronavirus 2 by RT PCR POSITIVE (A) NEGATIVE Final    Comment: RESULT CALLED TO, READ BACK BY AND VERIFIED WITH: Marissa Belos @2228  on 05/17/21 skl (NOTE) SARS-CoV-2 target nucleic acids are DETECTED.  The SARS-CoV-2 RNA is generally detectable in upper respiratory specimens during the acute phase of infection. Positive results are indicative of the presence of the identified virus, but do not rule out bacterial infection or co-infection with other pathogens not detected by the test. Clinical correlation with patient history and other diagnostic information is necessary to determine patient infection status. The expected result is Negative.  Fact Sheet for Patients: EntrepreneurPulse.com.au  Fact Sheet for Healthcare Providers: IncredibleEmployment.be  This test is not yet approved or cleared by the [place] FDA and  has been authorized for detection and/or diagnosis of SARS-CoV-2 by FDA under an Emergency Use Authorization (EUA).  This EUA will remain in effect (meaning this test can  be used) for the duration of  the COVID-19 declaration under Section 564(b)(1) of the Act, 21 U.S.C. section 360bbb-3(b)(1), unless the authorization is terminated or revoked sooner.     Influenza A by PCR NEGATIVE NEGATIVE Final   Influenza B by PCR NEGATIVE NEGATIVE Final    Comment: (NOTE) The Xpert Xpress SARS-CoV-2/FLU/RSV plus assay is intended as an aid in the diagnosis of influenza from Nasopharyngeal swab specimens and should not be used as a sole basis for  treatment. Nasal washings and aspirates are unacceptable for Xpert Xpress SARS-CoV-2/FLU/RSV testing.  Fact Sheet for Patients: EntrepreneurPulse.com.au  Fact Sheet for Healthcare Providers: IncredibleEmployment.be  This test is not yet approved or cleared by the [place] FDA and has been authorized for detection and/or diagnosis of SARS-CoV-2 by FDA under an Emergency Use Authorization (EUA). This EUA will remain in effect (meaning this test can be used) for the duration of the COVID-19 declaration under Section 564(b)(1) of the Act, 21 U.S.C. section 360bbb-3(b)(1), unless the authorization is terminated or revoked.  Performed at [facility], [address], [place], [place] [address]       Studies: No results found.  Scheduled Meds:  gabapentin  100 mg Oral BID   sodium chloride flush  3 mL Intravenous Q12H    Continuous Infusions:  sodium chloride 10 mL/hr at 05/13/21 1614     LOS: 16 days     Annita Brod, MD [facility]   05/19/2021, 1:50 PM

## 2021-05-19 NOTE — Progress Notes (Signed)
PT Cancellation Note  Patient Details Name: Stephen [name] MRN: [id number] DOB: 1946-03-21   Cancelled Treatment:    Pt and family have decided to go comfort measures. PT will sign off.     Willette Pa 05/19/2021, 1:32 PM

## 2021-05-19 NOTE — Progress Notes (Signed)
Patient transferred to 124. Alert and oriented x4 at time of transfer with family at bedside. All belongings sent with patients. On 5L O2. Report called to [facility], Therapist, sports.

## 2021-05-19 NOTE — Progress Notes (Signed)
NAME:  Stephen [name], MRN:  [id number], DOB:  12-Aug-1945, LOS: 42 ADMISSION DATE:  04/28/2021, CONSULTATION DATE:  11/15 REFERRING MD:  [place]ada Crane, CHIEF COMPLAINT:  dyspnea  History of Present Illness:  75 y/o male admitted with acute on chronic hypoxemic and hypercarbic respiratory failure in setting of ILD, decompensated HFpEF, pulmonary hypertension, had left lung white out on 11/14, improved with thoracentesis taking 700cc off.  Has profound muscular deconditioning, has worsening renal function.   Pertinent  Medical History  HFpEF Obstructive sleep apnea Pulmonary fibrosis Coronary artery disease Myocardial infarction Chronic kidney disease stage III Left renal artery stenosis Diabetes mellitus type 2 Hypertension Hyperlipidemia Recurrent UTIs Peripheral vascular disease  Micro Data:  04/09/2021: SARS-CoV-2 and influenza PCR>> negative 05/09/2021: Hepatitis B surface antigen>> non-reactive 05/15/2021: Urine culture>> 05/17/2021: Pleural fluid>> 11/15 sars cov 2/flu > positive   Antimicrobials:  Ceftriaxone 11/6>> 11/10 Doxycycline 10/31>> 11/3 Meropenem 11/14>>   Significant Hospital Events: Including procedures, antibiotic start and stop dates in addition to other pertinent events   04/19/2021: Presented to ED ED with chest pain and shortness of breath (just 2 days after prior admission).  Admitted by hospitalist for treatment of acute CHF exacerbation 05/16/2021: New fever.  UA with pyuria and concern for parapneumonic effusion, placed on meropenem 05/17/2021: CT chest with concern for near complete atelectasis of the left lung.  Underwent thoracentesis with removal of 700 cc of blood-tinged fluid.  PCCM contacted for pulmonary consult 11/16 COVID positive  Interim History / Subjective:   Remains on NIMV Saying that he wants to die now Seen by palliative yesterday   Objective   Blood pressure 128/64, pulse 64, temperature 97.6 F (36.4 C), temperature source  Oral, resp. rate 18, height 5\' 5"  (1.651 m), weight 110 kg, SpO2 99 %.    FiO2 (%):  [36 %] 36 %   Intake/Output Summary (Last 24 hours) at 05/19/2021 0855 Last data filed at 05/19/2021 0300 Gross per 24 hour  Intake 779.83 ml  Output 300 ml  Net 479.83 ml   Filed Weights   05/11/21 1021 05/17/21 0600 05/19/21 0500  Weight: 110.3 kg 110.2 kg 110 kg    Examination:  General:  Resting comfortably in bed on BIPAP HENT: NCAT OP clear PULM: Diminished on left, clear on right, normal effort CV: RRR, no mgr GI: BS+, soft, nontender MSK: normal bulk and tone Neuro: drowsy, moaning intermittently, not conversant for me, moves all four extremities    Resolved Hospital Problem list     Assessment & Plan:  Chronic respiratory failure with hypoxemia and hypercarbia Baseline ILD (left greater than right, felt to be due to occupational exposure) Fever, leukocytosis, increased cough: HCAP, COVID 19 Parapneumonic effusion> transudate based on albumin level Left lung atelectasis> possibly some mucus plugging in left lung based on exam, imaging Pulmonary hypertension/CHF> will need to consider hemodialysis, but is he a good long term candidate? Not sure he can get in and out of a dialysis chair by himself Physical deconditioning Acute metabolic encephalopathy due to hypercarbia  Discussion Continued decline over the week.  He is now saying he wants to die.  Plan: Would recommend changing to full comfort measures I explained this to his wife who voiced understanding though for obvious reasons is struggling with this BIPAP prn at this point Would consider d/c all non-comfort focused medications  PCCM available prn  Best Practice (right click and "Reselect all SmartList Selections" daily)    Per TR[facility] Code status DNR  Labs  CBC: Recent Labs  Lab 05/13/21 0524 05/14/21 0601 05/15/21 2055 05/17/21 0442 05/18/21 0446  WBC 4.6 4.1 12.4* 15.1* 11.9*  NEUTROABS  --   --  10.6*  12.4*  --   HGB 8.5* 7.9* 8.4* 7.6* 7.3*  HCT 27.3* 25.8* 27.4* 25.8* 24.5*  MCV 88.9 89.0 87.8 91.8 91.8  PLT 263 274 312 290 361    Basic Metabolic Panel: Recent Labs  Lab 05/12/21 1017 05/13/21 0524 05/13/21 0524 05/14/21 0601 05/15/21 0449 05/16/21 0516 05/17/21 0442 05/18/21 0446  NA  --  137  139   < > 140 137 136 136 136  K  --  3.5  3.6   < > 3.4* 3.5 3.6 3.6 3.4*  CL  --  94*  96*   < > 93* 92* 91* 92* 91*  CO2  --  34*  35*   < > 37* 36* 37* 34* 33*  GLUCOSE  --  193*  192*   < > 201* 253* 143* 191* 177*  BUN  --  82*  90*   < > 93* 88* 84* 105* 120*  CREATININE  --  3.19*  3.29*   < > 3.20* 3.25* 3.93* 4.48* 5.11*  CALCIUM  --  8.0*  8.1*   < > 8.4* 8.5* 8.3* 8.1* 8.0*  MG 2.0 2.3  --  2.1  --  2.3  --  2.1  PHOS  --  3.4  3.7  --  3.9  4.1 3.6  --   --  5.8*   < > = values in this interval not displayed.   GFR: Estimated Creatinine Clearance: 14.7 mL/min (A) (by C-G formula based on SCr of 5.11 mg/dL (H)). Recent Labs  Lab 05/14/21 0601 05/15/21 2055 05/17/21 0442 05/18/21 0446  PROCALCITON  --  0.16 0.66 0.74  WBC 4.1 12.4* 15.1* 11.9*    Liver Function Tests: Recent Labs  Lab 05/13/21 0524 05/14/21 0601 05/15/21 0449 05/17/21 0442 05/17/21 1350  AST  --   --   --  42*  --   ALT  --   --   --  58*  --   ALKPHOS  --   --   --  70  --   BILITOT  --   --   --  0.9  --   PROT  --   --   --  6.8 7.1  ALBUMIN 1.9* 2.0* 2.1* 1.8*  --    No results for input(s): LIPASE, AMYLASE in the last 168 hours. No results for input(s): AMMONIA in the last 168 hours.  ABG    Component Value Date/Time   PHART 7.43 05/17/2021 1232   PCO2ART 62 (H) 05/17/2021 1232   PO2ART 107 05/17/2021 1232   HCO3 41.2 (H) 05/17/2021 1232   TCO2 25 08/31/2010 1529   O2SAT 98.3 05/17/2021 1232     Coagulation Profile: No results for input(s): INR, PROTIME in the last 168 hours.  Cardiac Enzymes: No results for input(s): CKTOTAL, CKMB, CKMBINDEX, TROPONINI in  the last 168 hours.  HbA1C: Hgb A1c MFr Bld  Date/Time Value Ref Range Status  05/01/2021 06:51 AM 7.5 (H) 4.8 - 5.6 % Final    Comment:    (NOTE) Pre diabetes:          5.7%-6.4%  Diabetes:              >6.4%  Glycemic control for   <7.0% adults with diabetes   02/21/2021 02:44 PM 6.7 (  H) 4.6 - 6.5 % Final    Comment:    Glycemic Control Guidelines for People with Diabetes:Non Diabetic:  <6%Goal of Therapy: <7%Additional Action Suggested:  >8%     CBG: Recent Labs  Lab 05/17/21 2048 05/18/21 0903 05/18/21 1322 05/18/21 1623 05/19/21 0822  GLUCAP 179* 172* 210* 260* 268*        Roselie Awkward, MD [place] P[facility]M Pager: [phone] Cell: [phone] After 7:00 pm call Elink  [phone]

## 2021-05-19 NOTE — Progress Notes (Signed)
Nutrition Follow-up  DOCUMENTATION CODES:   Morbid obesity  INTERVENTION:   -Continue Ensure Enlive po TID, each supplement provides 350 kcal and 20 grams of protein  -Continue MVI with minerals daily  NUTRITION DIAGNOSIS:   Increased nutrient needs related to wound healing as evidenced by estimated needs.  Ongoing  GOAL:   Patient will meet greater than or equal to 90% of their needs  Unmet  MONITOR:   PO intake, Supplement acceptance, Labs, Weight trends, Skin, I & O's  REASON FOR ASSESSMENT:   Low Braden    ASSESSMENT:   Stephen [name] is a 75 y.o. male with medical history significant for , interstitial lung disease, pulmonary hypertension, OSA with chronic respiratory failure on home O2 at 4L and Bipap, followed by pulmonology, as well as diastolic heart failure, CAD, DM, CKD IV, recently hospitalized from 10/16-10/29 with NSTEMI treated with heparin infusion, and respiratory failure secondary to CHF and COPD exacerbation who returns to the ED 2 days later with complaints of chest pain and shortness of breath. Chest pain was severe heaviness on his chest.EMS reports sats of 91% on his normal 4 L.  They attempted to increase it to 6 L with minimal improvement to 93% and and then put him on a nonrebreather and sats were 100%.  Wife contributes to history .  11/15- s/p lt thoracenteis (700 ml removed), +COVID-19  Reviewed I/O's: +1.8 L x 24 hours and -13.5 L since 05/05/21  UOP: 300 ml x 24 hours  Per chart review, pt is doing very poorly. Nephrology does not recommend HD (pt with AKI secondary to obstructive uropathy).   Pt remains with very poor oral intake, which has declined since last visit. Meal documented documented at 0-25%. Pt taking 1-2 Ensure supplements per day, but suspects he is not consuming entire supplement.   Reviewed wt hx; wt has been stable over the past week.   Palliative care following for goals of care; family leaning towards comfort and no  escalation of care.   Medications reviewed and include solu-medrol, vitamin B-12, and remdesivir.   Pt with poor oral intake and would benefit from nutrient dense supplement. One Ensure Enlive supplement provides 350 kcals, 20 grams protein, and 44-45 grams of carbohydrate vs one Glucerna shake supplement, which provides 220 kcals, 10 grams of protein, and 26 grams of carbohydrate. Given pt's hx of DM, RD will reassess adequacy of PO intake, CBGS, and adjust supplement regimen as appropriate at follow-up.    Labs reviewed: K: 3.4, Phos: 5.8, CBGS: 172-268 (inpatient orders for glycemic control are 0-20 units insulin aspart TID with meals, 0-5 units insulin aspart daily at bedtime, 3 units insulin aspart TID with meals, and 30 units insulin detemir daily at bedtime).    Diet Order:   Diet Order             Diet heart healthy/carb modified Room service appropriate? Yes; Fluid consistency: Thin  Diet effective now                   EDUCATION NEEDS:   Education needs have been addressed  Skin:  Skin Assessment: Skin Integrity Issues: Skin Integrity Issues:: Stage II Stage II: rt/ lt buttocks  Last BM:  05/14/21  Height:   Ht Readings from Last 1 Encounters:  04/25/2021 5\' 5"  (1.651 m)    Weight:   Wt Readings from Last 1 Encounters:  05/19/21 110 kg    Ideal Body Weight:  61.8 kg  BMI:  Body mass index is 40.36 kg/m.  Estimated Nutritional Needs:   Kcal:  1950-2150  Protein:  110-125 grams  Fluid:  > 1.9 L    Loistine Chance, RD, LDN, [place] Registered Dietitian II Certified Diabetes Care and Education Specialist Please refer to [facility] for RD and/or RD on-call/weekend/after hours pager

## 2021-05-19 NOTE — Progress Notes (Signed)
Daily Progress Note   Patient Name: Stephen [name]       Date: 05/19/2021 DOB: 1945/11/11  Age: 75 y.o. MRN#: [id number] Attending Physician: Annita Brod, MD Primary Care Physician: [name], MD Admit Date: 04/28/2021 Length of Stay: 16 days  Reason for Consultation/Follow-up: Establishing goals of care  HPI/Patient Profile:  75 y.o. male  with past medical history of multivessel coronary disease, chronic heart failure, pulmonary fibrosis, pulmonary hypertension, COPD, chronic kidney disease, diabetes, renal artery stenosis, and interstitial lung disease admitted on 04/14/2021 with acute on chronic heart failure with demand ischemia.  Palliative medicine was consulted to discuss goals of care and symptom management.  Due to progressive decline limiting ability to recover, PMT was re-consulted.  Subjective:   Subjective: Chart Reviewed. Updates received. Patient Assessed. Created space and opportunity for patient  and family to explore thoughts and feelings regarding current medical situation.  Today's Discussion: This morning I met with the patient and his wife at the bedside.  We had a long discussion with a lot of reminiscing, sharing of family members.  The patient is not on BiPAP currently, his wife checks his oxygen saturations periodically and it was noted to be at 95% while in the room.  He does answer simple yes/no questions, and attempts at longer answers are broken up by dyspnea.  His wife states that her children came by yesterday and it did have a discussion.  I shared that I felt that she understood what his current situation is and what it likely means.  She agreed.  She states that her children are having a hard time excepting that they do not want to "let go of their father."  I again shared that his body is making it as well and God's will.  We had a healthy discussion on today and her confidence that he will go to heaven and they will be reunited  Sunday.  She stated that her children would likely be having some afternoon.  I offered that I would check back and meet with her children as well to help explain what is going on, answering questions, and provide emotional support.  She has declined chaplain services as they have a pastor who she is in contact with.  I met again with the patient, wife, and now son and daughter in attendance.  We discussed his current clinical situation, observations made over the past 1 to 2 days including the patient stating that he feels he is fine and does not want to prolong things.  Explained options regarding ongoing aggressive care, full scope of care versus comfort focus.  The patient was able to come off the BiPAP to participate in the discussion.  He stated that it is his desire for comfort care as he knows that he is dying.  His daughter states that this would not be which she would need to use for herself, but she is excepting of her father's wishes and will support him.  Provided emotional support to the family with this discussion.  The patient's wife agreed to shift to comfort care.  Provided emotional and general support through therapeutic listening, sharing stories, reminiscing, and other techniques.  I answered all questions and addressed all concerns.  Review of Systems  Constitutional:        Denies pain in general  Respiratory:  Positive for shortness of breath (some).   Gastrointestinal:  Negative for abdominal pain, nausea and vomiting.   Objective:   Vital Signs:  BP 137/61 (BP Location: Right Arm)   Pulse 64   Temp 97.8 F (36.6 C) (Axillary)   Resp 18   Ht 5' 5" (1.651 m)   Wt 110 kg   SpO2 97%   BMI 40.36 kg/m   Physical Exam: Physical Exam Vitals and nursing note reviewed.  Constitutional:      General: He is not in acute distress.    Appearance: He is obese. He is ill-appearing.  HENT:     Head: Normocephalic and atraumatic.  Cardiovascular:     Rate and Rhythm:  Normal rate and regular rhythm.  Pulmonary:     Effort: No respiratory distress.  Abdominal:     General: Abdomen is protuberant.     Palpations: Abdomen is soft.  Skin:    General: Skin is warm and dry.  Neurological:     Mental Status: He is alert.    Palliative Assessment/Data: 20-30%   Assessment & Plan:   Impression: Present on Admission:  Unstable angina [facility])  Patient with multiple acute and chronic conditions.  Currently appears to be in multisystem organ failure and progressive decline over the last several days.  Creatinine trending up and question for need of dialysis, noted debility/deconditioning and previously felt appropriate for SNF/rehab.  However, given his poor lung function and heart function I doubt that he would tolerate dialysis, catheter placement, or rehab.  Overall prognosis very poor.  Discussed with wife that the patient appears to be approaching end-of-life.   I am concerned about his ability to tolerate HD line placement, tolerating hemodialysis with his current pulmonary and cardiac function, ability to tolerate rehab with his multisystem organ failure.  SUMMARY OF RECOMMENDATIONS   Remain DNR Shift to comfort care Discontinue unnecessary, non-comfort medications, interventions (labs, etc) PMT will continue to follow  Code Status: DNR  Prognosis: < 2 weeks  Discharge Planning: Anticipated Hospital Death  Discussed with: Medical team, nursing team, patient, patient's wife, patient's son, patient's daughter  Thank you for allowing [place] to participate in the care of WHYATT KLINGER PMT will continue to support holistically.  Time Total: 130 min (multiple prolonged meetings with family)  Visit consisted of counseling and education dealing with the complex and emotionally intense issues of symptom management and palliative care in the setting of serious and potentially life-threatening illness. Greater than 50%  of this time was spent counseling  and coordinating care related to the above assessment and plan.  Walden Field, NP Palliative Medicine Team  Team Phone # [phone] (Nights/Weekends)  03/01/2021, 8:17 AM

## 2021-05-20 DIAGNOSIS — I5033 Acute on chronic diastolic (congestive) heart failure: Secondary | ICD-10-CM | POA: Diagnosis not present

## 2021-05-20 MED ORDER — MORPHINE 100MG IN NS 100ML (1MG/ML) PREMIX INFUSION
1.0000 mg/h | INTRAVENOUS | Status: DC
  Administered 2021-05-20: 18:00:00 1 mg/h via INTRAVENOUS
  Filled 2021-05-20: qty 100

## 2021-05-20 MED ORDER — MORPHINE BOLUS VIA INFUSION
2.0000 mg | INTRAVENOUS | Status: DC | PRN
  Administered 2021-05-20: 2 mg via INTRAVENOUS
  Filled 2021-05-20: qty 2

## 2021-05-21 ENCOUNTER — Telehealth: Payer: Self-pay | Admitting: Family Medicine

## 2021-05-21 LAB — BODY FLUID CULTURE W GRAM STAIN: Culture: NO GROWTH

## 2021-05-21 NOTE — Telephone Encounter (Signed)
Called his wife to give condolences.  She thanked me for the call.  I was always glad to see this gentleman.  He was unfailingly kind to me at clinic and always asked me about my family.  He will be missed.

## 2021-05-24 ENCOUNTER — Ambulatory Visit: Payer: Medicare HMO | Admitting: Family

## 2021-06-02 NOTE — TOC Progression Note (Signed)
Transition of Care [facility]) - Progression Note    Patient Details  Name: Stephen [name] MRN: [id number] Date of Birth: 1946-03-12  Transition of Care [facility]) CM/SW [place], RN Phone Number: 06-19-2021, 11:43 AM  Clinical Narrative:   According to clinical care staff, in house passing expected.      Expected Discharge Plan: Skilled Nursing Facility Barriers to Discharge: Continued Medical Work up  Expected Discharge Plan and Services Expected Discharge Plan: [place] Choice: [place] Living arrangements for the past 2 months: Single Family Home                                       Social Determinants of Health (SDOH) Interventions    Readmission Risk Interventions Readmission Risk Prevention Plan June 19, 2021 09/20/2018  Transportation Screening (No Data) Complete  PCP or Specialist Appt within 3-5 Days - Complete  HRI or [place] - Patient refused  Social Work Consult for [place] Planning/Counseling - Patient refused  Palliative Care Screening - Not Applicable  Medication Review [employer]) - Complete  Some recent data might be hidden

## 2021-06-02 NOTE — Care Management Important Message (Signed)
Important Message  Patient Details  Name: Stephen [name] MRN: [id number] Date of Birth: 05-15-1946   Medicare Important Message Given:  Yes  Patient is now comfort care and out of respect for the patient and family no Important Message from [facility] given.   [name] [place] 05-30-2021, 9:38 AM

## 2021-06-02 NOTE — Death Summary Note (Addendum)
Death Summary  Stephen [name] HDQ:[id number] DOB: 1945-12-04 DOA: May 25, 2021  PCP: [name], MD  Admit date: 05-25-21 Date of Death: 06/12/21 Time of Death: 9:01 PM Notification: [name], MD notified of death of 06/15/2021   History of present illness:  75 year old male with past medical history for chronic interstitial lung disease, pulmonary hypertension, morbid obesity, obstructive sleep apnea with chronic respiratory failure with hypoxia, CAD, diabetes mellitus and stage IV chronic kidney disease with recent hospitalization a month prior for non-STEMI and heart failure who started experiencing chest pain and shortness of breath 2 days after discharge patient noted to be hypoxic and brought into the emergency room.  Felt to be in volume overload and admitted to the hospitalist service on 05/26/2023.  Since hospitalization, he has been diuresed which initially improved.  Hospitalization also complicated by urinary retention and hematuria with Foley catheter placement by urology.  Also noted to have some confusion felt to be secondary to hypercarbia and parapneumonic effusion.  Pulmonary consulted for worsening hypoxia and CT scan of chest noted complete left lung atelectasis.  Patient underwent left-sided thoracentesis with 700 cc of fluid removed.  Nephrology has been following for worsening kidney injury.  Patient made DNR on 11/15.  Palliative care has been asked to see.  After having increased cough, patient ended up testing positive for COVID, likely from family who have also gotten COVID.   After making a little progression, patient had multiple discussions with critical care and palliative care and made the decision, along with his family, to be comfort care as of 06-12-2023.   He passed away on 2023/06/16 at 9:01 PM.  Final Diagnoses:  Acute on chronic respiratory failure with hypoxia and hypercapnia in the setting of chronic interstitial lung disease, parapneumonic effusion,  pulmonary hypertension, left lung atelectasis secondary to mucous plugging, healthcare associated pneumonia, acute on chronic diastolic heart failure and COVID-19 infection.  Acute kidney injury in the setting of stage IV chronic kidney disease.  Pressure Injury 05/04/21 Buttocks Right;Left Stage 2 -  Partial thickness loss of dermis presenting as a shallow open injury with a red, pink wound bed without slough. Pressure wound on buttocks (Active)  05/04/21 1145  Location: Buttocks  Location Orientation: Right;Left  Staging: Stage 2 -  Partial thickness loss of dermis presenting as a shallow open injury with a red, pink wound bed without slough.  Wound Description (Comments): Pressure wound on buttocks  Present on Admission: Yes     Pressure Injury 05/11/21 Buttocks Right Stage 2 -  Partial thickness loss of dermis presenting as a shallow open injury with a red, pink wound bed without slough. (Active)  05/11/21 1000  Location: Buttocks  Location Orientation: Right  Staging: Stage 2 -  Partial thickness loss of dermis presenting as a shallow open injury with a red, pink wound bed without slough.  Wound Description (Comments):   Present on Admission:      Pressure Injury 05/11/21 Buttocks Left Stage 2 -  Partial thickness loss of dermis presenting as a shallow open injury with a red, pink wound bed without slough. (Active)  05/11/21 1000  Location: Buttocks  Location Orientation: Left  Staging: Stage 2 -  Partial thickness loss of dermis presenting as a shallow open injury with a red, pink wound bed without slough.  Wound Description (Comments):   Present on Admission:    B12 deficiency  Morbid obesity  Moderate major depression, single episode  Diabetes mellitus  Anemia chronic disease  Hypothyroidism     The results of significant diagnostics from this hospitalization (including imaging, microbiology, ancillary and laboratory) are listed below for reference.    Significant  Diagnostic Studies: CT CHEST WO CONTRAST  Result Date: 05/17/2021 CLINICAL DATA:  Pneumonia EXAM: CT CHEST WITHOUT CONTRAST TECHNIQUE: Multidetector CT imaging of the chest was performed following the standard protocol without IV contrast. COMPARISON:  Previous studies including CT chest done on 04/05/2021 and chest radiograph done on 05/08/2021 FINDINGS: Cardiovascular: Heart is enlarged in size. Coronary artery calcifications are seen. Dense calcifications are seen in the mitral annulus. There are coarse calcifications in the thoracic aorta. Small pericardial effusion is present. Mediastinum/Nodes: No significant lymphadenopathy seen. Lungs/Pleura: There is almost complete atelectasis of left lung with air bronchograms. Left main bronchus appears narrower than the right. As far as seen, there is no definite demonstrable extrinsic mass lesion adjacent to the left main bronchus. Small left pleural effusion is present. There is minimal right pleural effusion. There is crowding of markings in the posterior right lung, possibly subsegmental atelectasis. There is no pneumothorax. Upper Abdomen: There is 2.8 cm nodule in the left adrenal with no significant change. Surgical clips are seen in the right adrenal bed. Possible cyst is seen in the upper pole of left kidney. Musculoskeletal: Fairly extensive calcifications are seen in the anterior spinal ligament. Please correlate for possible ankylosing spondylitis. IMPRESSION: There is almost complete atelectasis of left lung, possibly due to mucous plugging. Small left pleural effusion is present. Linear densities and subtle ground-glass densities in the right lung may suggest scarring and subsegmental atelectasis. There is minimal right pleural effusion. There is 2.8 cm nodule in the left adrenal with no significant change. Other findings as described in the body of the report. Imaging finding of almost complete atelectasis of left lung was relayed to patient's nurse  Ronalee Belts by telephone call. Electronically Signed   By: Elmer Picker M.D.   On: 05/17/2021 10:55   US PELVIS (TRANSABDOMINAL ONLY)  Result Date: 05/08/2021 CLINICAL DATA:  Acute urinary retention EXAM: LIMITED ULTRASOUND OF PELVIS TECHNIQUE: Limited transabdominal ultrasound examination of the pelvis was performed. COMPARISON:  None. FINDINGS: A Foley catheter is seen within a decompressed bladder. IMPRESSION: A Foley catheter balloon is seen within a decompressed bladder. No other abnormalities. Electronically Signed   By: Dorise Bullion III M.D.   On: 05/08/2021 18:23   US Venous Img Upper Uni Left (DVT)  Result Date: 04/27/2021 CLINICAL DATA:  Left upper extremity pain and edema. EXAM: LEFT UPPER EXTREMITY VENOUS DOPPLER ULTRASOUND TECHNIQUE: Gray-scale sonography with graded compression, as well as color Doppler and duplex ultrasound were performed to evaluate the upper extremity deep venous system from the level of the subclavian vein and including the jugular, axillary, basilic, radial, ulnar and upper cephalic vein. Spectral Doppler was utilized to evaluate flow at rest and with distal augmentation maneuvers. COMPARISON:  None. FINDINGS: Contralateral Subclavian Vein: Respiratory phasicity is normal and symmetric with the symptomatic side. No evidence of thrombus. Normal compressibility. Internal Jugular Vein: No evidence of thrombus. Normal compressibility, respiratory phasicity and response to augmentation. Subclavian Vein: No evidence of thrombus. Normal compressibility, respiratory phasicity and response to augmentation. Axillary Vein: No evidence of thrombus. Normal compressibility, respiratory phasicity and response to augmentation. Cephalic Vein: Superficial thrombophlebitis noted in the left cephalic vein at the level of the upper arm extending to the antecubital fossa. Basilic Vein: No evidence of thrombus. Normal compressibility, respiratory phasicity and response to augmentation.  Brachial Veins: No  evidence of thrombus. Normal compressibility, respiratory phasicity and response to augmentation. Radial Veins: No evidence of thrombus. Normal compressibility, respiratory phasicity and response to augmentation. Ulnar Veins: No evidence of thrombus. Normal compressibility, respiratory phasicity and response to augmentation. Venous Reflux:  None visualized. Other Findings:  No abnormal fluid collections identified. IMPRESSION: 1. No evidence of DVT within the left upper extremity. 2. Superficial thrombophlebitis of the left cephalic vein in the upper arm. Electronically Signed   By: Aletta Edouard M.D.   On: 04/27/2021 10:27   US Venous Img Upper Uni Right(DVT)  Result Date: 04/26/2021 CLINICAL DATA:  Right upper extremity edema. EXAM: RIGHT UPPER EXTREMITY VENOUS DOPPLER ULTRASOUND TECHNIQUE: Gray-scale sonography with graded compression, as well as color Doppler and duplex ultrasound were performed to evaluate the upper extremity deep venous system from the level of the subclavian vein and including the jugular, axillary, basilic, radial, ulnar and upper cephalic vein. Spectral Doppler was utilized to evaluate flow at rest and with distal augmentation maneuvers. COMPARISON:  None. FINDINGS: Contralateral Subclavian Vein: Respiratory phasicity is normal and symmetric with the symptomatic side. No evidence of thrombus. Normal compressibility. Internal Jugular Vein: No evidence of thrombus. Normal compressibility, respiratory phasicity and response to augmentation. Subclavian Vein: No evidence of thrombus. Normal compressibility, respiratory phasicity and response to augmentation. Axillary Vein: No evidence of thrombus. Normal compressibility, respiratory phasicity and response to augmentation. Cephalic Vein: No evidence of thrombus. Normal compressibility, respiratory phasicity and response to augmentation. Basilic Vein: No evidence of thrombus. Normal compressibility, respiratory phasicity  and response to augmentation. Brachial Veins: No evidence of thrombus. Normal compressibility, respiratory phasicity and response to augmentation. Radial Veins: No evidence of thrombus. Normal compressibility, respiratory phasicity and response to augmentation. Ulnar Veins: No evidence of thrombus. Normal compressibility, respiratory phasicity and response to augmentation. Venous Reflux:  None visualized. Other Findings: No evidence of superficial thrombophlebitis or abnormal fluid collection. IMPRESSION: No evidence of DVT within the right upper extremity. Electronically Signed   By: Aletta Edouard M.D.   On: 04/26/2021 07:51   DG Chest Port 1 View  Result Date: 05/18/2021 CLINICAL DATA:  Acute respiratory failure with hypoxia and hypercarbia. EXAM: PORTABLE CHEST 1 VIEW COMPARISON:  May 17, 2021. FINDINGS: Low lung volumes. Persistent left basilar opacity. No visible pneumothorax on this semi erect radiograph. Cardiac silhouette is similar. Partially imaged right shoulder degenerative change. IMPRESSION: 1. Persistent left basilar opacity, which may reflect any combination of atelectasis/consolidation and layering pleural effusion. 2. Pulmonary vascular congestion. Electronically Signed   By: Margaretha Sheffield M.D.   On: 05/18/2021 08:05   DG Chest Port 1 View  Result Date: 05/17/2021 CLINICAL DATA:  Post thoracentesis EXAM: PORTABLE CHEST 1 VIEW COMPARISON:  05/15/2021 FINDINGS: No pneumothorax. Low lung volumes. Remains opacification at the left lung base. Increased right basilar atelectasis. IMPRESSION: No pneumothorax. Remains left lung base opacification, which may reflect combination atelectasis/consolidation and residual pleural fluid. Increased right basilar atelectasis. Electronically Signed   By: Macy Mis M.D.   On: 05/17/2021 11:55   DG Chest Port 1 View  Result Date: 05/15/2021 CLINICAL DATA:  Acute respiratory failure.  Hypoxia. EXAM: PORTABLE CHEST 1 VIEW COMPARISON:   05/08/2021 FINDINGS: Lung volumes are small and pulmonary insufflation has diminished since prior examination. Patchy infiltrate within the left mid and lower lung zone is again noted, possibly infectious or inflammatory in nature. Small left pleural effusion persists, unchanged. No pneumothorax. No pleural effusion on the right. Cardiac size within normal limits. No acute bone abnormality.  Degenerative changes noted within the right shoulder. IMPRESSION: Progressive pulmonary hypoinflation. Stable left basilar pulmonary infiltrate and small left pleural effusion, possibly reflecting a focal pneumonic infiltrate and associated parapneumonic effusion. Electronically Signed   By: Fidela Salisbury M.D.   On: 05/15/2021 20:45   DG Chest Port 1 View  Result Date: 05/08/2021 CLINICAL DATA:  Shortness of breath. EXAM: PORTABLE CHEST 1 VIEW COMPARISON:  04/02/2021.  Chest CT 04/05/2021 FINDINGS: Low lung volumes. Prominent heart size likely accentuated by technique and low lung volumes. Questionable retrocardiac opacity versus superimposition of soft tissue structures, evaluation is limited by soft tissue attenuation from habitus. Regardless, suspected left pleural effusion there is patchy airspace disease in both perihilar lungs. No pneumothorax. IMPRESSION: 1. Patchy airspace disease in both perihilar lungs, pneumonia or pulmonary edema. 2. Questionable retrocardiac opacity versus superimposition of soft tissue structures. Regardless, suspected left pleural effusion. Electronically Signed   By: Keith Rake M.D.   On: 05/08/2021 17:14   DG Chest Port 1 View  Result Date: 04/11/2021 CLINICAL DATA:  Shortness of breath EXAM: PORTABLE CHEST 1 VIEW COMPARISON:  Chest radiograph dated 04/26/2021. CT chest dated 04/05/2021. FINDINGS: Lower lobe predominant scarring/fibrosis. When correlating with prior CT, this is compatible with chronic interstitial lung disease. No definite pleural effusions. No pneumothorax.  Cardiomegaly.  Thoracic aortic atherosclerosis. IMPRESSION: Lower lobe predominant scarring/fibrosis, corresponding to suspected chronic interstitial lung disease on CT. Electronically Signed   By: Julian Hy M.D.   On: 04/21/2021 02:36   DG Chest Port 1 View  Result Date: 04/26/2021 CLINICAL DATA:  Shortness of breath EXAM: PORTABLE CHEST 1 VIEW COMPARISON:  04/22/2021 FINDINGS: Stable cardiomediastinal contours. The lung volumes are low. Persistent bilateral interstitial and airspace densities with a mid and lower lung zone predominance. Compared with 04/22/2021 the aeration to lungs is not significantly changed in the interval. IMPRESSION: No change in aeration to the lungs compared with 04/22/2021. Electronically Signed   By: Kerby Moors M.D.   On: 04/26/2021 11:00   US THORACENTESIS ASP PLEURAL SPACE W/IMG GUIDE  Result Date: 05/17/2021 INDICATION: Shortness of breath with left pleural effusion, concern for infection. EXAM: ULTRASOUND GUIDED LEFT THORACENTESIS MEDICATIONS: Local 1% lidocaine only. COMPLICATIONS: None immediate. PROCEDURE: An ultrasound guided thoracentesis was thoroughly discussed with the patient and questions answered. The benefits, risks, alternatives and complications were also discussed. The patient understands and wishes to proceed with the procedure. Written consent was obtained. Ultrasound was performed to localize and mark an adequate pocket of fluid in the left chest. The area was then prepped and draped in the normal sterile fashion. 1% Lidocaine was used for local anesthesia. Under ultrasound guidance a 19 gauge, 7-cm, Yueh catheter was introduced. Thoracentesis was performed. The catheter was removed and a dressing applied. FINDINGS: A total of approximately 700 mL of blood tinged fluid was removed. Samples were sent to the laboratory as requested by the clinical team. IMPRESSION: Successful ultrasound guided left thoracentesis yielding 700 mL of pleural fluid.  Read By: Tsosie Billing PA-C Electronically Signed   By: Albin Felling M.D.   On: 05/17/2021 11:24    Microbiology: Recent Results (from the past 240 hour(s))  Acid Fast Smear (AFB)     Status: None   Collection Time: 05/17/21 11:05 AM   Specimen: PATH Cytology Pleural fluid  Result Value Ref Range Status   AFB Specimen Processing Concentration  Final   Acid Fast Smear Negative  Final    Comment: (NOTE) Performed At: [facility] [place] [address]  [address] [place], [place] [id number] Rush Farmer MD EN:[id number]    Source (AFB) PLEURAL  Final    Comment: Performed at [facility], [address] [place], [place] [address]  Body fluid culture w Gram Stain     Status: None   Collection Time: 05/17/21 11:05 AM   Specimen: PATH Cytology Pleural fluid  Result Value Ref Range Status   Specimen Description   Final    PLEURAL Performed at [facility], [address], [place], [place] [address]    Special Requests   Final    PLEURAL Performed at [facility], [address] [place], [place] [address]    Gram Stain   Final    FEW WBC PRESENT, PREDOMINANTLY MONONUCLEAR NO ORGANISMS SEEN    Culture   Final    NO GROWTH 3 DAYS Performed at [facility], [place] [address] [place], [place] [address]    Report Status 05/23/2021 FINAL  Final  MRSA Next Gen by PCR, Nasal     Status: None   Collection Time: 05/17/21  8:49 PM   Specimen: Nasal Mucosa; Nasal Swab  Result Value Ref Range Status   MRSA by PCR Next Gen NOT DETECTED NOT DETECTED Final    Comment: (NOTE) The GeneXpert MRSA Assay (FDA approved for NASAL specimens only), is one component of a comprehensive MRSA colonization surveillance program. It is not intended to diagnose MRSA infection nor to guide or monitor treatment for MRSA infections. Test performance is not FDA approved in patients less than 49 years old. Performed at [facility], [address] [place], [place]  [address]   Resp Panel by RT-PCR (Flu A&B, Covid) Nasal Mucosa     Status: Abnormal   Collection Time: 05/17/21  8:49 PM   Specimen: Nasal Mucosa; Nasopharyngeal(NP) swabs in vial transport medium  Result Value Ref Range Status   SARS Coronavirus 2 by RT PCR POSITIVE (A) NEGATIVE Final    Comment: RESULT CALLED TO, READ BACK BY AND VERIFIED WITH: Marissa Belos @2228  on 05/17/21 skl (NOTE) SARS-CoV-2 target nucleic acids are DETECTED.  The SARS-CoV-2 RNA is generally detectable in upper respiratory specimens during the acute phase of infection. Positive results are indicative of the presence of the identified virus, but do not rule out bacterial infection or co-infection with other pathogens not detected by the test. Clinical correlation with patient history and other diagnostic information is necessary to determine patient infection status. The expected result is Negative.  Fact Sheet for Patients: EntrepreneurPulse.com.au  Fact Sheet for Healthcare Providers: IncredibleEmployment.be  This test is not yet approved or cleared by the [place] FDA and  has been authorized for detection and/or diagnosis of SARS-CoV-2 by FDA under an Emergency Use Authorization (EUA).  This EUA will remain in effect (meaning this test can  be used) for the duration of  the COVID-19 declaration under Section 564(b)(1) of the Act, 21 U.S.C. section 360bbb-3(b)(1), unless the authorization is terminated or revoked sooner.     Influenza A by PCR NEGATIVE NEGATIVE Final   Influenza B by PCR NEGATIVE NEGATIVE Final    Comment: (NOTE) The Xpert Xpress SARS-CoV-2/FLU/RSV plus assay is intended as an aid in the diagnosis of influenza from Nasopharyngeal swab specimens and should not be used as a sole basis for treatment. Nasal washings and aspirates are unacceptable for Xpert Xpress SARS-CoV-2/FLU/RSV testing.  Fact Sheet for  Patients: EntrepreneurPulse.com.au  Fact Sheet for Healthcare Providers: IncredibleEmployment.be  This test is not yet  approved or cleared by the [place] and has been authorized for detection and/or diagnosis of SARS-CoV-2 by FDA under an Emergency Use Authorization (EUA). This EUA will remain in effect (meaning this test can be used) for the duration of the COVID-19 declaration under Section 564(b)(1) of the Act, 21 U.S.C. section 360bbb-3(b)(1), unless the authorization is terminated or revoked.  Performed at [facility], [address] [place], [place] [address]      Labs: Basic Metabolic Panel: Recent Labs  Lab 05/17/21 0442 05/18/21 0446  NA 136 136  K 3.6 3.4*  CL 92* 91*  CO2 34* 33*  GLUCOSE 191* 177*  BUN 105* 120*  CREATININE 4.48* 5.11*  CALCIUM 8.1* 8.0*  MG  --  2.1  PHOS  --  5.8*   Liver Function Tests: Recent Labs  Lab 05/17/21 0442 05/17/21 1350  AST 42*  --   ALT 58*  --   ALKPHOS 70  --   BILITOT 0.9  --   PROT 6.8 7.1  ALBUMIN 1.8*  --    No results for input(s): LIPASE, AMYLASE in the last 168 hours. No results for input(s): AMMONIA in the last 168 hours. CBC: Recent Labs  Lab 05/17/21 0442 05/18/21 0446  WBC 15.1* 11.9*  NEUTROABS 12.4*  --   HGB 7.6* 7.3*  HCT 25.8* 24.5*  MCV 91.8 91.8  PLT 290 310   Cardiac Enzymes: No results for input(s): CKTOTAL, CKMB, CKMBINDEX, TROPONINI in the last 168 hours. D-Dimer No results for input(s): DDIMER in the last 72 hours. BNP: Invalid input(s): POCBNP CBG: Recent Labs  Lab 05/18/21 0903 05/18/21 1322 05/18/21 1623 05/19/21 0822 05/19/21 1208  GLUCAP 172* 210* 260* 268* 301*   Anemia work up No results for input(s): VITAMINB12, FOLATE, FERRITIN, TIBC, IRON, RETICCTPCT in the last 72 hours. Urinalysis    Component Value Date/Time   COLORURINE PINK (A) 05/16/2021 0124   APPEARANCEUR CLOUDY (A) 05/16/2021 0124    LABSPEC 1.005 05/16/2021 0124   PHURINE 5.0 05/16/2021 0124   GLUCOSEU NEGATIVE 05/16/2021 0124   HGBUR MODERATE (A) 05/16/2021 0124   BILIRUBINUR NEGATIVE 05/16/2021 0124   BILIRUBINUR neg 12/06/2010 1627   KETONESUR NEGATIVE 05/16/2021 0124   PROTEINUR NEGATIVE 05/16/2021 0124   UROBILINOGEN 0.2 12/06/2010 1627   NITRITE NEGATIVE 05/16/2021 0124   LEUKOCYTESUR TRACE (A) 05/16/2021 0124   Sepsis Labs Invalid input(s): PROCALCITONIN,  WBC,  LACTICIDVEN     SIGNED:  Annita Brod, MD  [facility] 05/23/2021, 3:00 PM Pager   If 7PM-7AM, please contact night-coverage [email or web address] Password [email or web address]

## 2021-06-02 NOTE — Progress Notes (Signed)
Nutrition Brief Note  Chart reviewed. Pt now transitioning to comfort care.  No further nutrition interventions planned at this time.  Please re-consult as needed.   Mattis Featherly W, RD, LDN, CDCES Registered Dietitian II Certified Diabetes Care and Education Specialist Please refer to AMION for RD and/or RD on-call/weekend/after hours pager   

## 2021-06-02 NOTE — Progress Notes (Signed)
Daily Progress Note   Patient Name: Stephen [name]       Date: May 28, 2021 DOB: 1945/12/25  Age: 75 y.o. MRN#: [id number] Attending Physician: Annita Brod, MD Primary Care Physician: [name], MD Admit Date: 04/08/2021 Length of Stay: 17 days  Reason for Consultation/Follow-up: Establishing goals of care and Terminal Care  HPI/Patient Profile:  75 y.o. male  with past medical history of multivessel coronary disease, chronic heart failure, pulmonary fibrosis, pulmonary hypertension, COPD, chronic kidney disease, diabetes, renal artery stenosis, and interstitial lung disease admitted on 04/20/2021 with acute on chronic heart failure with demand ischemia.   Palliative medicine was consulted to discuss goals of care and symptom management.   Due to progressive decline limiting ability to recover, PMT was re-consulted.  Subjective:   Subjective: Chart Reviewed. Updates received. Patient Assessed. Created space and opportunity for patient  and family to explore thoughts and feelings regarding current medical situation.  Today's Discussion: Today I met with the patient's wife, son, daughter, son-in-law at the bedside.  They state earlier today he was little agitated but the pain medication helped.  They agree that he appears comfortable at this time.  Allow time for sharing and reminiscing.  Expressed my support of them during this difficult time.  His wife states that they have spoken with the pastor a couple times over the past couple days and he is offered to pray with him over the phone.  Doristine Bosworth is a bit reluctant to visit the patient as he is COVID-19 positive and the pastor has family health concerns to be aware.  Asked him to call us if we can be of any support or if there are any further needs.  Emotional general support provided by therapeutic listening, reminiscing, sharing of stories, and other techniques.  Answered all questions and addressed all concerns.  Review of  Systems  Unable to perform ROS: Acuity of condition   Objective:   Vital Signs:  BP 134/60 (BP Location: Left Arm)   Pulse 65   Temp 97.8 F (36.6 C) (Oral)   Resp 14   Ht $R'5\' 5"'QS$  (1.651 m)   Wt 110 kg   SpO2 95%   BMI 40.36 kg/m   Physical Exam: Physical Exam Vitals and nursing note reviewed.  Constitutional:      General: He is sleeping. He is not in acute distress. HENT:     Head: Normocephalic and atraumatic.  Pulmonary:     Effort: Pulmonary effort is normal. No respiratory distress.  Skin:    General: Skin is warm and dry.    Palliative Assessment/Data: 10%   Assessment & Plan:   Impression: Present on Admission:  Unstable angina [facility])  Patient with multiple acute and chronic conditions.  Currently appears to be in multisystem organ failure and progressive decline over the last several days.  Creatinine trending up and question for need of dialysis, noted debility/deconditioning and previously felt appropriate for SNF/rehab.  However, given his poor lung function and heart function I doubt that he would tolerate dialysis, catheter placement, or rehab.  Overall prognosis very poor.  Discussed with wife that the patient appears to be approaching end-of-life.   Family has made patient comfort care.  SUMMARY OF RECOMMENDATIONS   Remain DNR Continue comfort care Please contact PMT for any additional needs PMT will continue to follow while inpatient  Code Status: DNR  Prognosis: Hours - Days  Discharge Planning: Anticipated Hospital Death  Discussed with: Medical team, nursing team, patient  family  Thank you for allowing [place] to participate in the care of KIET GEER PMT will continue to support holistically.  Time Total: 35 min  Visit consisted of counseling and education dealing with the complex and emotionally intense issues of symptom management and palliative care in the setting of serious and potentially life-threatening illness. Greater than 50%   of this time was spent counseling and coordinating care related to the above assessment and plan.  Walden Field, NP Palliative Medicine Team  Team Phone # [phone] (Nights/Weekends)  03/01/2021, 8:17 AM

## 2021-06-02 NOTE — Progress Notes (Signed)
PROGRESS NOTE  Stephen [name] SWN:[id number] DOB: 16-Nov-1945 DOA: 04/29/2021 PCP: Stephen Ghent, MD  HPI/Recap of past 19 hours: 75 year old male with past medical history for chronic interstitial lung disease, pulmonary hypertension, morbid obesity, obstructive sleep apnea with chronic respiratory failure with hypoxia, CAD, diabetes mellitus and stage IV chronic kidney disease with recent hospitalization a month prior for non-STEMI and heart failure who started experiencing chest pain and shortness of breath 2 days after discharge patient noted to be hypoxic and brought into the emergency room.  Felt to be in volume overload and admitted to the hospitalist service on 10/31.  Since hospitalization, he has been diuresed which initially improved.  Hospitalization also complicated by urinary retention and hematuria with Foley catheter placement by urology.  Also noted to have some confusion felt to be secondary to hypercarbia and parapneumonic effusion.  Pulmonary consulted for worsening hypoxia and CT scan of chest noted complete left lung atelectasis.  Patient underwent left-sided thoracentesis with 700 cc of fluid removed.  Nephrology has been following for worsening kidney injury.  Patient made DNR on 11/15.  Palliative care has been asked to see.  After having increased cough, patient ended up testing positive for COVID, likely from family who have also gotten COVID.  After making a little progression, patient had multiple discussions with critical care and palliative care and made the decision, along with his family, to be comfort care as of 11/17.  Currently patient sleeping.  Family present state he appears comfortable  Assessment/Plan: Principal Problem:   Acute on chronic respiratory failure with hypoxia and hypercapnia (HCC) in the setting of chronic interstitial lung disease, parapneumonic effusion, pulmonary hypertension, acute on chronic diastolic heart failure, left lung atelectasis  felt to be secondary from mucous plugging, healthcare associated pneumonia and COVID-19 infection.: Greatly appreciate critical care help.  Patient previously on steroids, Remdisivir, IV meropenem, which were discontinued after he became comfort care.  Acute kidney injury in the setting of stage IV chronic kidney disease: Nephrology following.  Felt to be secondary to obstructive uropathy as well as overdiuresis.  Not long-term candidate for dialysis.  Now comfort care.    B12 deficiency: Previously on replacement, now comfort care.    Pressure injury of skin:  Pressure Injury 05/04/21 Buttocks Right;Left Stage 2 -  Partial thickness loss of dermis presenting as a shallow open injury with a red, pink wound bed without slough. Pressure wound on buttocks (Active)  05/04/21 1145  Location: Buttocks  Location Orientation: Right;Left  Staging: Stage 2 -  Partial thickness loss of dermis presenting as a shallow open injury with a red, pink wound bed without slough.  Wound Description (Comments): Pressure wound on buttocks  Present on Admission: Yes     Pressure Injury 05/11/21 Buttocks Right Stage 2 -  Partial thickness loss of dermis presenting as a shallow open injury with a red, pink wound bed without slough. (Active)  05/11/21 1000  Location: Buttocks  Location Orientation: Right  Staging: Stage 2 -  Partial thickness loss of dermis presenting as a shallow open injury with a red, pink wound bed without slough.  Wound Description (Comments):   Present on Admission:      Pressure Injury 05/11/21 Buttocks Left Stage 2 -  Partial thickness loss of dermis presenting as a shallow open injury with a red, pink wound bed without slough. (Active)  05/11/21 1000  Location: Buttocks  Location Orientation: Left  Staging: Stage 2 -  Partial thickness loss of dermis  presenting as a shallow open injury with a red, pink wound bed without slough.  Wound Description (Comments):   Present on Admission:       Moderate major depression, single episode ([place]): Was seen earlier by psychiatry.  Hypothyroidism: Synthroid discontinued now that he is comfort care.  Diabetes mellitus: Previously on insulin sliding scale, now comfort care  Anemia of chronic disease: Previously on Epogen, now comfort care  Code Status: DNR, now comfort care  Family Communication: Wife at the bedside  Disposition Plan: Patient comfort care, expected to pass in hospital   Consultants: Palliative care Urology Critical care/pulmonary Nephrology Psychiatry  Procedures: Placement of Foley catheter  Antimicrobials: Remdisivir IV meropenem  DVT prophylaxis: Subcu heparin  Level of care: Med-Surg   Objective: Vitals:   05/19/21 1701 31-May-2021 0452  BP: 137/67 134/60  Pulse: (!) 57 65  Resp: 18 14  Temp: 97.7 F (36.5 C) 97.8 F (36.6 C)  SpO2: 99% 95%    Intake/Output Summary (Last 24 hours) at 05/31/2021 0832 Last data filed at 05-31-2021 0300 Gross per 24 hour  Intake 237 ml  Output 1350 ml  Net -1113 ml    Filed Weights   05/11/21 1021 05/17/21 0600 05/19/21 0500  Weight: 110.3 kg 110.2 kg 110 kg   Body mass index is 40.36 kg/m.  Exam:  General: Resting comfortably Cardiovascular: Regular rate and rhythm, S1-S2, 2 out of 6 systolic ejection murmur Respiratory: Decreased breath sounds throughout   Data Reviewed: CBC: Recent Labs  Lab 05/14/21 0601 05/15/21 2055 05/17/21 0442 05/18/21 0446  WBC 4.1 12.4* 15.1* 11.9*  NEUTROABS  --  10.6* 12.4*  --   HGB 7.9* 8.4* 7.6* 7.3*  HCT 25.8* 27.4* 25.8* 24.5*  MCV 89.0 87.8 91.8 91.8  PLT 274 312 290 267    Basic Metabolic Panel: Recent Labs  Lab 05/14/21 0601 05/15/21 0449 05/16/21 0516 05/17/21 0442 05/18/21 0446  NA 140 137 136 136 136  K 3.4* 3.5 3.6 3.6 3.4*  CL 93* 92* 91* 92* 91*  CO2 37* 36* 37* 34* 33*  GLUCOSE 201* 253* 143* 191* 177*  BUN 93* 88* 84* 105* 120*  CREATININE 3.20* 3.25* 3.93* 4.48* 5.11*   CALCIUM 8.4* 8.5* 8.3* 8.1* 8.0*  MG 2.1  --  2.3  --  2.1  PHOS 3.9  4.1 3.6  --   --  5.8*    GFR: Estimated Creatinine Clearance: 14.7 mL/min (A) (by C-G formula based on SCr of 5.11 mg/dL (H)). Liver Function Tests: Recent Labs  Lab 05/14/21 0601 05/15/21 0449 05/17/21 0442 05/17/21 1350  AST  --   --  42*  --   ALT  --   --  58*  --   ALKPHOS  --   --  70  --   BILITOT  --   --  0.9  --   PROT  --   --  6.8 7.1  ALBUMIN 2.0* 2.1* 1.8*  --     No results for input(s): LIPASE, AMYLASE in the last 168 hours. No results for input(s): AMMONIA in the last 168 hours. Coagulation Profile: No results for input(s): INR, PROTIME in the last 168 hours. Cardiac Enzymes: No results for input(s): CKTOTAL, CKMB, CKMBINDEX, TROPONINI in the last 168 hours. BNP (last 3 results) No results for input(s): PROBNP in the last 8760 hours. HbA1C: No results for input(s): HGBA1C in the last 72 hours. CBG: Recent Labs  Lab 05/18/21 0903 05/18/21 1322 05/18/21 1623 05/19/21 1245  05/19/21 1208  GLUCAP 172* 210* 260* 268* 301*    Lipid Profile: No results for input(s): CHOL, HDL, LDLCALC, TRIG, CHOLHDL, LDLDIRECT in the last 72 hours. Thyroid Function Tests: No results for input(s): TSH, T4TOTAL, FREET4, T3FREE, THYROIDAB in the last 72 hours. Anemia Panel: Recent Labs    05/19/21 0540  FERRITIN 890*    Urine analysis:    Component Value Date/Time   COLORURINE PINK (A) 05/16/2021 0124   APPEARANCEUR CLOUDY (A) 05/16/2021 0124   LABSPEC 1.005 05/16/2021 0124   PHURINE 5.0 05/16/2021 0124   GLUCOSEU NEGATIVE 05/16/2021 0124   HGBUR MODERATE (A) 05/16/2021 0124   BILIRUBINUR NEGATIVE 05/16/2021 0124   BILIRUBINUR neg 12/06/2010 1627   KETONESUR NEGATIVE 05/16/2021 0124   PROTEINUR NEGATIVE 05/16/2021 0124   UROBILINOGEN 0.2 12/06/2010 1627   NITRITE NEGATIVE 05/16/2021 0124   LEUKOCYTESUR TRACE (A) 05/16/2021 0124   Sepsis  Labs: @LABRCNTIP (procalcitonin:4,lacticidven:4)  ) Recent Results (from the past 240 hour(s))  Acid Fast Smear (AFB)     Status: None   Collection Time: 05/17/21 11:05 AM   Specimen: PATH Cytology Pleural fluid  Result Value Ref Range Status   AFB Specimen Processing Concentration  Final   Acid Fast Smear Negative  Final    Comment: (NOTE) Performed At: [facility] [address] [place], [place] [id number] Rush Farmer MD NI:[id number]    Source (AFB) PLEURAL  Final    Comment: Performed at [facility], [address] [place], [place] [address]  Body fluid culture w Gram Stain     Status: None (Preliminary result)   Collection Time: 05/17/21 11:05 AM   Specimen: PATH Cytology Pleural fluid  Result Value Ref Range Status   Specimen Description   Final    PLEURAL Performed at [facility], [address] [place], [place] [address]    Special Requests   Final    PLEURAL Performed at [facility], [address] [place], [place] [address]    Gram Stain   Final    FEW WBC PRESENT, PREDOMINANTLY MONONUCLEAR NO ORGANISMS SEEN    Culture   Final    NO GROWTH 3 DAYS Performed at [facility], [place] [address] [place], [place] [address]    Report Status PENDING  Incomplete  MRSA Next Gen by PCR, Nasal     Status: None   Collection Time: 05/17/21  8:49 PM   Specimen: Nasal Mucosa; Nasal Swab  Result Value Ref Range Status   MRSA by PCR Next Gen NOT DETECTED NOT DETECTED Final    Comment: (NOTE) The GeneXpert MRSA Assay (FDA approved for NASAL specimens only), is one component of a comprehensive MRSA colonization surveillance program. It is not intended to diagnose MRSA infection nor to guide or monitor treatment for MRSA infections. Test performance is not FDA approved in patients less than 85 years old. Performed at [facility], [address] [place],  [address]   Resp Panel by RT-PCR (Flu A&B, Covid)  Nasal Mucosa     Status: Abnormal   Collection Time: 05/17/21  8:49 PM   Specimen: Nasal Mucosa; Nasopharyngeal(NP) swabs in vial transport medium  Result Value Ref Range Status   SARS Coronavirus 2 by RT PCR POSITIVE (A) NEGATIVE Final    Comment: RESULT CALLED TO, READ BACK BY AND VERIFIED WITH: Marissa Belos @2228  on 05/17/21 skl (NOTE) SARS-CoV-2 target nucleic acids are DETECTED.  The SARS-CoV-2 RNA is generally detectable in upper respiratory specimens during the  acute phase of infection. Positive results are indicative of the presence of the identified virus, but do not rule out bacterial infection or co-infection with other pathogens not detected by the test. Clinical correlation with patient history and other diagnostic information is necessary to determine patient infection status. The expected result is Negative.  Fact Sheet for Patients: EntrepreneurPulse.com.au  Fact Sheet for Healthcare Providers: IncredibleEmployment.be  This test is not yet approved or cleared by the [place] FDA and  has been authorized for detection and/or diagnosis of SARS-CoV-2 by FDA under an Emergency Use Authorization (EUA).  This EUA will remain in effect (meaning this test can  be used) for the duration of  the COVID-19 declaration under Section 564(b)(1) of the Act, 21 U.S.C. section 360bbb-3(b)(1), unless the authorization is terminated or revoked sooner.     Influenza A by PCR NEGATIVE NEGATIVE Final   Influenza B by PCR NEGATIVE NEGATIVE Final    Comment: (NOTE) The Xpert Xpress SARS-CoV-2/FLU/RSV plus assay is intended as an aid in the diagnosis of influenza from Nasopharyngeal swab specimens and should not be used as a sole basis for treatment. Nasal washings and aspirates are unacceptable for Xpert Xpress SARS-CoV-2/FLU/RSV testing.  Fact Sheet for Patients: EntrepreneurPulse.com.au  Fact Sheet for Healthcare  Providers: IncredibleEmployment.be  This test is not yet approved or cleared by the [place] FDA and has been authorized for detection and/or diagnosis of SARS-CoV-2 by FDA under an Emergency Use Authorization (EUA). This EUA will remain in effect (meaning this test can be used) for the duration of the COVID-19 declaration under Section 564(b)(1) of the Act, 21 U.S.C. section 360bbb-3(b)(1), unless the authorization is terminated or revoked.  Performed at [facility], [address], [place], [place] [address]       Studies: No results found.  Scheduled Meds:  gabapentin  100 mg Oral BID   sodium chloride flush  3 mL Intravenous Q12H    Continuous Infusions:  sodium chloride Stopped (06-13-2021 0348)     LOS: 17 days     Annita Brod, MD [facility]   2021-06-13, 8:32 AM

## 2021-06-02 NOTE — Progress Notes (Signed)
Stephen [name] , 75 year old male, with acute on chronic heart failurewith demand ischemia CAD, CKD, COPD, pulmonary fibrosis, pulmonary hypertension, renal artery stenosis, interstial lung disease and acute on chronic respiratory failure was under palliative/comfort care passed away today, November 18/2022 at Sep 27, 2099 pm.   Death pronounced by nursing staff as ordered

## 2021-06-02 NOTE — Progress Notes (Addendum)
Palliative care brief progress note  Received call from patient's family member Vilinda Boehringer, whom I believe is his daughter) that Mr. Massman was uncomfortable and requesting call back to assist with pain management.  I called and spoke with Vilinda Boehringer as well as patient's wife.  I did hear Mr. Brisbin moaning in the background.  Family reports that he has been more restless and in pain and they do not feel he is getting medication in a timely manner.  They report asking for pain medication multiple times and being told that it is not due yet and he cannot have anything else for breakthrough pain.    Discussed options including changes to regimen of as needed dosing or transitioning him to continuous infusion as he has been getting pain medication regularly.  His wife is very frustrated and tells me that they are not getting the care they were promised and that I should do what ever I think is the right thing for her husband.  As goal is comfort moving forward and he is noted to be having periods of waking up in pain, my recommendation to his wife was for initiation of continuous infusion in order to ensure he is comfortable.  I called and discussed with Dr. [place] Pink and then placed orders for morphine 1 mg/h continuous infusion with additional bolus of 2 mg every 30 minutes as needed for uncontrolled pain, shortness of breath, or respiratory rate greater than 25.  Micheline Rough, MD [place] Team [phone]

## 2021-06-02 DEATH — deceased

## 2021-06-08 LAB — TRIGLYCERIDES, BODY FLUIDS: Triglycerides, Fluid: 17 mg/dL

## 2021-06-13 ENCOUNTER — Ambulatory Visit: Payer: Medicare HMO | Admitting: Cardiovascular Disease

## 2021-07-04 LAB — ACID FAST CULTURE WITH REFLEXED SENSITIVITIES (MYCOBACTERIA): Acid Fast Culture: NEGATIVE

## 2021-08-05 LAB — MISC LABCORP TEST (SEND OUT)
LabCorp test name: 5367
Labcorp test code: 9985

## 2021-08-29 ENCOUNTER — Telehealth: Payer: Medicare HMO

## 2022-01-12 ENCOUNTER — Ambulatory Visit: Payer: Medicare HMO

## 2024-04-08 NOTE — Progress Notes (Signed)
 Assessment & Plan:  1. Moderate persistent asthma, unspecified whether complicated (Primary)  2. ILD (interstitial lung disease) (HCC)  3. Chronic respiratory failure with hypoxia (HCC)  4. Pulmonary HTN (HCC)  5. OSA on CPAP  6. Extreme obesity with alveolar hypoventilation (HCC)  7. Chronic diastolic CHF (congestive heart failure) (HCC)   Patient Instructions  1.  Continue Breo Ellipta  for now.  2.  See you in follow-up in 4 months time you will see me or the nurse practitioner.  Please note: late entry documentation due to logistical difficulties during COVID-19 pandemic. This note is filed for information purposes only, and is not intended to be used for billing, nor does it represent the full scope/nature of the visit in question. Please see any associated scanned media linked to date of encounter for additional pertinent information.  Subjective:    HPI: Stephen [name] is a 78 y.o. male presenting to the pulmonology clinic on 06/05/2019 with report of: Follow-up (pt reports of sob with exertion and occ wheezing. on 3 pulse with exertion and 4L cont at home. )     Outpatient Encounter Medications as of 06/05/2019  Medication Sig   ACCU-CHEK SOFTCLIX LANCETS lancets Use as instructed to check blood sugar three times daily and as needed.  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   nitroGLYCERIN  (NITROSTAT ) 0.4 MG SL tablet Place 1 tablet (0.4 mg total) under the tongue every 5 (five) minutes as needed. May repeat x3   omeprazole (PRILOSEC) 20 MG capsule Take 20 mg by mouth daily before breakfast.   [DISCONTINUED] acetaminophen  (TYLENOL ) 325 MG tablet Take 2 tablets (650 mg total) by mouth every 6 (six) hours as needed for mild pain (or Fever >/= 101).   [DISCONTINUED] albuterol  (VENTOLIN  HFA) 108 (90 Base) MCG/ACT inhaler Inhale 2 puffs into the lungs every 6 (six) hours as needed for wheezing or shortness of breath. Dispense 3 inhalers.   [DISCONTINUED] Alcohol  Swabs  (B-D SINGLE USE SWABS REGULAR) PADS Use to cleans area prior to checking blood sugar three times daily and as needed.  Diagnosis:  E11.22  Insulin  dependent.   [DISCONTINUED] amLODipine  (NORVASC ) 5 MG tablet Take 1 tablet (5 mg total) by mouth daily.   [DISCONTINUED] aspirin  81 MG tablet Take 81 mg by mouth daily.   [DISCONTINUED] atorvastatin  (LIPITOR ) 80 MG tablet TAKE 1 TABLET AT BEDTIME   [DISCONTINUED] benzonatate  (TESSALON ) 100 MG capsule Take 1-2 capsules (100-200 mg total) by mouth 3 (three) times daily as needed. (Patient not taking: Reported on 07/26/2020)   [DISCONTINUED] Blood Glucose Monitoring Suppl (ACCU-CHEK AVIVA PLUS) w/Device KIT Use to check blood sugar three times daily and as needed.  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   [DISCONTINUED] cloNIDine  (CATAPRES ) 0.3 MG tablet Take 1 tablet (0.3 mg total) by mouth 3 (three) times daily.   [DISCONTINUED] clopidogrel  (PLAVIX ) 75 MG tablet TAKE 1 TABLET EVERY DAY   [DISCONTINUED] diclofenac  sodium (VOLTAREN ) 1 % GEL Apply 2 g topically 2 (two) times daily as needed (for left knee pain.).   [DISCONTINUED] diphenhydrAMINE  (BENADRYL ) 50 MG tablet Take 50 mg by mouth at bedtime as needed for allergies. (Patient not taking: Reported on 04/18/2021)   [DISCONTINUED] docusate sodium  (COLACE) 100 MG capsule Take 100 mg by mouth daily as needed for mild constipation.    [DISCONTINUED] erythromycin  ophthalmic ointment Place into the left eye every 6 (six)  hours.   [DISCONTINUED] fluticasone  furoate-vilanterol (BREO ELLIPTA ) 100-25 MCG/INH AEPB Inhale 1 puff into the lungs daily.   [DISCONTINUED] fluticasone  furoate-vilanterol (BREO ELLIPTA ) 100-25 MCG/INH AEPB Inhale 1 puff into the lungs daily.   [DISCONTINUED] gabapentin  (NEURONTIN ) 400 MG capsule Take 2 tabs in the AM and 3 at night.   [DISCONTINUED] glucose blood (ACCU-CHEK  AVIVA PLUS) test strip Use as instructed to check blood sugar 3 times daily or as needed.  Diagnosis:  E11.22  Insulin -dependent.   [DISCONTINUED] insulin  NPH-regular Human (NOVOLIN  70/30) (70-30) 100 UNIT/ML injection Inject 50-70 Units into the skin See admin instructions. Inject 70u under the skin every morning and 50u under the skin at bedtime   [DISCONTINUED] Insulin  Syringe-Needle U-100 (INSULIN  SYRINGE 1CC/30GX5/16) 30G X 5/16 1 ML MISC Use as directed to take insulin  Dx 250.62   [DISCONTINUED] isosorbide  mononitrate (IMDUR ) 60 MG 24 hr tablet Take 1.5 tablets (90 mg total) by mouth 2 (two) times daily.   [DISCONTINUED] levothyroxine  (SYNTHROID ) 50 MCG tablet Take 1 tablet (50 mcg total) by mouth daily before breakfast.   [DISCONTINUED] oxyCODONE  (ROXICODONE ) 15 MG immediate release tablet Take 1 tablet (15 mg total) by mouth daily as needed for pain.   [DISCONTINUED] polyethylene glycol powder (GLYCOLAX /MIRALAX ) 17 GM/SCOOP powder Take 17 g by mouth daily as needed (for constipation).   [DISCONTINUED] potassium chloride  (K-DUR) 10 MEQ tablet Take 1-2 tablets (10-20 mEq total) by mouth as directed. Alternating every other day. Take 1 tablet (10 mEq) daily and 2 tablets (20 mEq) daily   [DISCONTINUED] sildenafil  (REVATIO ) 20 MG tablet TAKE ONE TABLET BY MOUTH THREE TIMES A DAY   [DISCONTINUED] tamsulosin  (FLOMAX ) 0.4 MG CAPS capsule Take 1 capsule (0.4 mg total) by mouth at bedtime.   [DISCONTINUED] ezetimibe  (ZETIA ) 10 MG tablet Take 1 tablet (10 mg total) by mouth daily.   [DISCONTINUED] torsemide  (DEMADEX ) 20 MG tablet Take 1-2 tablets (20-40 mg total) by mouth as directed. Alternate every other day. 1 tablet (20 mg) daily and 2 tablets (40 mg) daily   No facility-administered encounter medications on file as of 06/05/2019.      Objective:   Vitals:   06/05/19 1006  BP: 130/80  Pulse: 64  Temp: 98.4 F (36.9 C)  Height: 5' 5 (1.651 m)  Weight: 277 lb (125.6 kg)  SpO2: 97%   TempSrc: Temporal  BMI (Calculated): 46.1     Physical exam documentation is limited by delayed entry of information.
